# Patient Record
Sex: Female | Born: 1950 | Race: White | Hispanic: No | Smoking: Never smoker
Health system: Southern US, Community
[De-identification: ages and names within clinical notes are randomized; demographics above are authoritative.]

## PROBLEM LIST (undated history)

## (undated) ENCOUNTER — Emergency Department (HOSPITAL_BASED_OUTPATIENT_CLINIC_OR_DEPARTMENT_OTHER): Admission: EM | Payer: Medicare Other | Source: Home / Self Care

## (undated) DIAGNOSIS — I10 Essential (primary) hypertension: Secondary | ICD-10-CM

## (undated) DIAGNOSIS — D510 Vitamin B12 deficiency anemia due to intrinsic factor deficiency: Secondary | ICD-10-CM

## (undated) DIAGNOSIS — G4733 Obstructive sleep apnea (adult) (pediatric): Secondary | ICD-10-CM

## (undated) DIAGNOSIS — F0390 Unspecified dementia without behavioral disturbance: Secondary | ICD-10-CM

## (undated) DIAGNOSIS — C801 Malignant (primary) neoplasm, unspecified: Secondary | ICD-10-CM

## (undated) DIAGNOSIS — E039 Hypothyroidism, unspecified: Secondary | ICD-10-CM

## (undated) DIAGNOSIS — E785 Hyperlipidemia, unspecified: Secondary | ICD-10-CM

## (undated) DIAGNOSIS — E119 Type 2 diabetes mellitus without complications: Secondary | ICD-10-CM

## (undated) DIAGNOSIS — F329 Major depressive disorder, single episode, unspecified: Secondary | ICD-10-CM

## (undated) DIAGNOSIS — G3184 Mild cognitive impairment, so stated: Secondary | ICD-10-CM

## (undated) DIAGNOSIS — J069 Acute upper respiratory infection, unspecified: Secondary | ICD-10-CM

## (undated) DIAGNOSIS — R112 Nausea with vomiting, unspecified: Secondary | ICD-10-CM

## (undated) DIAGNOSIS — Z8669 Personal history of other diseases of the nervous system and sense organs: Secondary | ICD-10-CM

## (undated) DIAGNOSIS — F419 Anxiety disorder, unspecified: Secondary | ICD-10-CM

## (undated) DIAGNOSIS — R51 Headache: Secondary | ICD-10-CM

## (undated) DIAGNOSIS — K219 Gastro-esophageal reflux disease without esophagitis: Secondary | ICD-10-CM

## (undated) DIAGNOSIS — S0990XA Unspecified injury of head, initial encounter: Secondary | ICD-10-CM

## (undated) DIAGNOSIS — N189 Chronic kidney disease, unspecified: Secondary | ICD-10-CM

## (undated) DIAGNOSIS — M797 Fibromyalgia: Secondary | ICD-10-CM

## (undated) DIAGNOSIS — I609 Nontraumatic subarachnoid hemorrhage, unspecified: Secondary | ICD-10-CM

## (undated) DIAGNOSIS — I499 Cardiac arrhythmia, unspecified: Secondary | ICD-10-CM

## (undated) DIAGNOSIS — F411 Generalized anxiety disorder: Secondary | ICD-10-CM

## (undated) DIAGNOSIS — H269 Unspecified cataract: Secondary | ICD-10-CM

## (undated) DIAGNOSIS — K589 Irritable bowel syndrome without diarrhea: Secondary | ICD-10-CM

## (undated) DIAGNOSIS — J189 Pneumonia, unspecified organism: Secondary | ICD-10-CM

## (undated) DIAGNOSIS — Z9889 Other specified postprocedural states: Secondary | ICD-10-CM

## (undated) DIAGNOSIS — R0602 Shortness of breath: Secondary | ICD-10-CM

## (undated) DIAGNOSIS — N842 Polyp of vagina: Secondary | ICD-10-CM

## (undated) DIAGNOSIS — G4730 Sleep apnea, unspecified: Secondary | ICD-10-CM

## (undated) DIAGNOSIS — T7840XA Allergy, unspecified, initial encounter: Secondary | ICD-10-CM

## (undated) DIAGNOSIS — K648 Other hemorrhoids: Secondary | ICD-10-CM

## (undated) DIAGNOSIS — R011 Cardiac murmur, unspecified: Secondary | ICD-10-CM

## (undated) DIAGNOSIS — K5790 Diverticulosis of intestine, part unspecified, without perforation or abscess without bleeding: Secondary | ICD-10-CM

## (undated) DIAGNOSIS — C229 Malignant neoplasm of liver, not specified as primary or secondary: Secondary | ICD-10-CM

## (undated) DIAGNOSIS — R0600 Dyspnea, unspecified: Secondary | ICD-10-CM

## (undated) DIAGNOSIS — Z8489 Family history of other specified conditions: Secondary | ICD-10-CM

## (undated) DIAGNOSIS — D649 Anemia, unspecified: Secondary | ICD-10-CM

## (undated) DIAGNOSIS — M1990 Unspecified osteoarthritis, unspecified site: Secondary | ICD-10-CM

## (undated) HISTORY — DX: Major depressive disorder, single episode, unspecified: F32.9

## (undated) HISTORY — DX: Gastro-esophageal reflux disease without esophagitis: K21.9

## (undated) HISTORY — DX: Anemia, unspecified: D64.9

## (undated) HISTORY — DX: Polyp of vagina: N84.2

## (undated) HISTORY — DX: Diverticulosis of intestine, part unspecified, without perforation or abscess without bleeding: K57.90

## (undated) HISTORY — PX: CATARACT EXTRACTION: SUR2

## (undated) HISTORY — DX: Unspecified cataract: H26.9

## (undated) HISTORY — DX: Sleep apnea, unspecified: G47.30

## (undated) HISTORY — DX: Generalized anxiety disorder: F41.1

## (undated) HISTORY — DX: Hyperlipidemia, unspecified: E78.5

## (undated) HISTORY — DX: Nausea with vomiting, unspecified: R11.2

## (undated) HISTORY — DX: Allergy, unspecified, initial encounter: T78.40XA

## (undated) HISTORY — DX: Malignant neoplasm of liver, not specified as primary or secondary: C22.9

## (undated) HISTORY — DX: Personal history of other diseases of the nervous system and sense organs: Z86.69

## (undated) HISTORY — DX: Other specified postprocedural states: Z98.890

## (undated) HISTORY — DX: Other hemorrhoids: K64.8

---
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
## 1970-12-17 HISTORY — PX: DILATION AND CURETTAGE OF UTERUS: SHX78

## 1979-08-18 DIAGNOSIS — M797 Fibromyalgia: Secondary | ICD-10-CM

## 1979-08-18 DIAGNOSIS — K589 Irritable bowel syndrome without diarrhea: Secondary | ICD-10-CM

## 1979-08-18 HISTORY — DX: Fibromyalgia: M79.7

## 1979-08-18 HISTORY — DX: Irritable bowel syndrome, unspecified: K58.9

## 1981-12-17 HISTORY — PX: CHOLECYSTECTOMY: SHX55

## 1995-12-18 DIAGNOSIS — N189 Chronic kidney disease, unspecified: Secondary | ICD-10-CM

## 1995-12-18 HISTORY — DX: Chronic kidney disease, unspecified: N18.9

## 2001-01-25 ENCOUNTER — Encounter: Payer: Self-pay | Admitting: Neurology

## 2001-01-25 ENCOUNTER — Ambulatory Visit (HOSPITAL_COMMUNITY): Admission: RE | Admit: 2001-01-25 | Discharge: 2001-01-25 | Payer: Self-pay | Admitting: Neurology

## 2002-10-08 ENCOUNTER — Encounter: Admission: RE | Admit: 2002-10-08 | Discharge: 2002-10-08 | Payer: Self-pay | Admitting: Family Medicine

## 2002-10-08 ENCOUNTER — Encounter: Payer: Self-pay | Admitting: Family Medicine

## 2006-12-17 HISTORY — PX: COLONOSCOPY: SHX174

## 2007-03-24 ENCOUNTER — Ambulatory Visit: Payer: Self-pay | Admitting: Internal Medicine

## 2007-03-24 LAB — CONVERTED CEMR LAB
ALT: 29 units/L (ref 0–40)
AST: 36 units/L (ref 0–37)
Albumin: 3.9 g/dL (ref 3.5–5.2)
Alkaline Phosphatase: 47 units/L (ref 39–117)
Bilirubin, Direct: 0.2 mg/dL (ref 0.0–0.3)
Total Bilirubin: 0.8 mg/dL (ref 0.3–1.2)
Total Protein: 7.8 g/dL (ref 6.0–8.3)

## 2007-05-23 ENCOUNTER — Ambulatory Visit: Payer: Self-pay | Admitting: Internal Medicine

## 2007-05-27 ENCOUNTER — Encounter: Payer: Self-pay | Admitting: Internal Medicine

## 2007-06-26 ENCOUNTER — Ambulatory Visit: Payer: Self-pay | Admitting: Internal Medicine

## 2007-07-03 ENCOUNTER — Encounter: Payer: Self-pay | Admitting: Internal Medicine

## 2007-07-03 ENCOUNTER — Ambulatory Visit: Payer: Self-pay | Admitting: Internal Medicine

## 2007-12-18 DIAGNOSIS — J189 Pneumonia, unspecified organism: Secondary | ICD-10-CM

## 2007-12-18 HISTORY — DX: Pneumonia, unspecified organism: J18.9

## 2008-05-04 ENCOUNTER — Telehealth: Payer: Self-pay | Admitting: Internal Medicine

## 2008-12-17 DIAGNOSIS — I609 Nontraumatic subarachnoid hemorrhage, unspecified: Secondary | ICD-10-CM

## 2008-12-17 HISTORY — DX: Nontraumatic subarachnoid hemorrhage, unspecified: I60.9

## 2009-05-21 ENCOUNTER — Inpatient Hospital Stay (HOSPITAL_COMMUNITY): Admission: EM | Admit: 2009-05-21 | Discharge: 2009-05-27 | Payer: Self-pay | Admitting: Emergency Medicine

## 2009-05-24 ENCOUNTER — Ambulatory Visit: Payer: Self-pay | Admitting: Physical Medicine & Rehabilitation

## 2009-06-27 ENCOUNTER — Ambulatory Visit (HOSPITAL_COMMUNITY): Admission: RE | Admit: 2009-06-27 | Discharge: 2009-06-27 | Payer: Self-pay | Admitting: General Surgery

## 2011-01-16 NOTE — Progress Notes (Signed)
Summary: out of meds, needs refill  Medications Added BELLADONNA ALK-PHENOBARBITAL 16.2 MG TABS (BELLADONNA ALK-PHENOBARBITAL) Take 1 tablet by mouth twice a day       Phone Note Call from Patient Call back at Home Phone [phone]   Call For: [name] Summary of Call: Pt is out of her medication for IBS. [facility]. Pt really needs for pain.  Patient's chart has been requested.  Initial call taken by: Verdell Face,  May 04, 2008 3:56 PM  Follow-up for Phone Call        Patient was advised the prescription was faxed to pharmacy at her request.  I asked patient to give 2 business days notice that she needs a medication refill in the future.  Patient verbalizes understanding. Follow-up by: Hortense Ramal CMA,  May 04, 2008 4:16 PM    New/Updated Medications: BELLADONNA ALK-PHENOBARBITAL 16.2 MG TABS (BELLADONNA ALK-PHENOBARBITAL) Take 1 tablet by mouth twice a day   Prescriptions: BELLADONNA ALK-PHENOBARBITAL 16.2 MG TABS (BELLADONNA ALK-PHENOBARBITAL) Take 1 tablet by mouth twice a day  #60 x 2   Entered by:   Hortense Ramal CMA   Authorized by:   Hart Carwin MD   Signed by:   Hortense Ramal CMA on 05/04/2008   Method used:   Printed then faxed to ...       [facility]  [id number]*       [address] [place], [place]  [address]       Ph: [phone]       Fax: [phone]   RxID:   [id number]

## 2011-02-12 ENCOUNTER — Other Ambulatory Visit: Payer: Self-pay | Admitting: Family Medicine

## 2011-02-12 ENCOUNTER — Other Ambulatory Visit: Payer: Self-pay

## 2011-02-12 DIAGNOSIS — R140 Abdominal distension (gaseous): Secondary | ICD-10-CM

## 2011-02-12 DIAGNOSIS — R110 Nausea: Secondary | ICD-10-CM

## 2011-02-16 ENCOUNTER — Other Ambulatory Visit: Payer: Self-pay

## 2011-03-26 LAB — CBC
HCT: 30 % — ABNORMAL LOW (ref 36.0–46.0)
HCT: 32.8 % — ABNORMAL LOW (ref 36.0–46.0)
HCT: 33.6 % — ABNORMAL LOW (ref 36.0–46.0)
Hemoglobin: 10.3 g/dL — ABNORMAL LOW (ref 12.0–15.0)
Hemoglobin: 11.4 g/dL — ABNORMAL LOW (ref 12.0–15.0)
Hemoglobin: 11.5 g/dL — ABNORMAL LOW (ref 12.0–15.0)
MCHC: 33.8 g/dL (ref 30.0–36.0)
MCHC: 34.3 g/dL (ref 30.0–36.0)
MCHC: 35.2 g/dL (ref 30.0–36.0)
MCV: 88.4 fL (ref 78.0–100.0)
MCV: 88.8 fL (ref 78.0–100.0)
MCV: 90.3 fL (ref 78.0–100.0)
Platelets: 200 10*3/uL (ref 150–400)
Platelets: 238 10*3/uL (ref 150–400)
Platelets: 251 10*3/uL (ref 150–400)
RBC: 3.32 MIL/uL — ABNORMAL LOW (ref 3.87–5.11)
RBC: 3.69 MIL/uL — ABNORMAL LOW (ref 3.87–5.11)
RBC: 3.8 MIL/uL — ABNORMAL LOW (ref 3.87–5.11)
RDW: 14.1 % (ref 11.5–15.5)
RDW: 14.5 % (ref 11.5–15.5)
RDW: 14.6 % (ref 11.5–15.5)
WBC: 6.6 10*3/uL (ref 4.0–10.5)
WBC: 7.2 10*3/uL (ref 4.0–10.5)
WBC: 9.3 10*3/uL (ref 4.0–10.5)

## 2011-03-26 LAB — PROTIME-INR
INR: 1 (ref 0.00–1.49)
Prothrombin Time: 13.3 seconds (ref 11.6–15.2)

## 2011-03-26 LAB — GLUCOSE, CAPILLARY
Glucose-Capillary: 107 mg/dL — ABNORMAL HIGH (ref 70–99)
Glucose-Capillary: 108 mg/dL — ABNORMAL HIGH (ref 70–99)
Glucose-Capillary: 116 mg/dL — ABNORMAL HIGH (ref 70–99)
Glucose-Capillary: 120 mg/dL — ABNORMAL HIGH (ref 70–99)
Glucose-Capillary: 123 mg/dL — ABNORMAL HIGH (ref 70–99)
Glucose-Capillary: 127 mg/dL — ABNORMAL HIGH (ref 70–99)
Glucose-Capillary: 133 mg/dL — ABNORMAL HIGH (ref 70–99)
Glucose-Capillary: 134 mg/dL — ABNORMAL HIGH (ref 70–99)
Glucose-Capillary: 135 mg/dL — ABNORMAL HIGH (ref 70–99)
Glucose-Capillary: 138 mg/dL — ABNORMAL HIGH (ref 70–99)
Glucose-Capillary: 139 mg/dL — ABNORMAL HIGH (ref 70–99)
Glucose-Capillary: 140 mg/dL — ABNORMAL HIGH (ref 70–99)
Glucose-Capillary: 143 mg/dL — ABNORMAL HIGH (ref 70–99)
Glucose-Capillary: 144 mg/dL — ABNORMAL HIGH (ref 70–99)
Glucose-Capillary: 150 mg/dL — ABNORMAL HIGH (ref 70–99)
Glucose-Capillary: 152 mg/dL — ABNORMAL HIGH (ref 70–99)
Glucose-Capillary: 156 mg/dL — ABNORMAL HIGH (ref 70–99)
Glucose-Capillary: 157 mg/dL — ABNORMAL HIGH (ref 70–99)
Glucose-Capillary: 158 mg/dL — ABNORMAL HIGH (ref 70–99)
Glucose-Capillary: 158 mg/dL — ABNORMAL HIGH (ref 70–99)
Glucose-Capillary: 159 mg/dL — ABNORMAL HIGH (ref 70–99)
Glucose-Capillary: 160 mg/dL — ABNORMAL HIGH (ref 70–99)
Glucose-Capillary: 162 mg/dL — ABNORMAL HIGH (ref 70–99)
Glucose-Capillary: 172 mg/dL — ABNORMAL HIGH (ref 70–99)
Glucose-Capillary: 173 mg/dL — ABNORMAL HIGH (ref 70–99)
Glucose-Capillary: 175 mg/dL — ABNORMAL HIGH (ref 70–99)
Glucose-Capillary: 175 mg/dL — ABNORMAL HIGH (ref 70–99)
Glucose-Capillary: 184 mg/dL — ABNORMAL HIGH (ref 70–99)
Glucose-Capillary: 206 mg/dL — ABNORMAL HIGH (ref 70–99)
Glucose-Capillary: 285 mg/dL — ABNORMAL HIGH (ref 70–99)

## 2011-03-26 LAB — BASIC METABOLIC PANEL
BUN: 5 mg/dL — ABNORMAL LOW (ref 6–23)
BUN: 6 mg/dL (ref 6–23)
CO2: 29 mEq/L (ref 19–32)
CO2: 29 mEq/L (ref 19–32)
Calcium: 8.3 mg/dL — ABNORMAL LOW (ref 8.4–10.5)
Calcium: 9.6 mg/dL (ref 8.4–10.5)
Chloride: 101 mEq/L (ref 96–112)
Chloride: 104 mEq/L (ref 96–112)
Creatinine, Ser: 0.54 mg/dL (ref 0.4–1.2)
Creatinine, Ser: 0.7 mg/dL (ref 0.4–1.2)
GFR calc Af Amer: >60 mL/min (ref >60)
GFR calc Af Amer: >60 mL/min (ref >60)
GFR calc non Af Amer: >60 mL/min (ref >60)
GFR calc non Af Amer: >60 mL/min (ref >60)
Glucose, Bld: 134 mg/dL — ABNORMAL HIGH (ref 70–99)
Glucose, Bld: 184 mg/dL — ABNORMAL HIGH (ref 70–99)
Potassium: 3.5 mEq/L (ref 3.5–5.1)
Potassium: 4.1 mEq/L (ref 3.5–5.1)
Sodium: 138 mEq/L (ref 135–145)
Sodium: 140 mEq/L (ref 135–145)

## 2011-03-26 LAB — URINALYSIS, ROUTINE W REFLEX MICROSCOPIC
Bilirubin Urine: NEGATIVE
Glucose, UA: 100 mg/dL — AB
Hgb urine dipstick: NEGATIVE
Ketones, ur: 15 mg/dL — AB
Nitrite: NEGATIVE
Protein, ur: NEGATIVE mg/dL
Specific Gravity, Urine: 1.045 — ABNORMAL HIGH (ref 1.005–1.030)
Urobilinogen, UA: 0.2 mg/dL (ref 0.0–1.0)
pH: 5.5 (ref 5.0–8.0)

## 2011-03-26 LAB — POCT I-STAT, CHEM 8
BUN: 7 mg/dL (ref 6–23)
Calcium, Ion: 1.16 mmol/L (ref 1.12–1.32)
Chloride: 103 mEq/L (ref 96–112)
Creatinine, Ser: 0.7 mg/dL (ref 0.4–1.2)
Glucose, Bld: 212 mg/dL — ABNORMAL HIGH (ref 70–99)
HCT: 38 % (ref 36.0–46.0)
Hemoglobin: 12.9 g/dL (ref 12.0–15.0)
Potassium: 3.5 mEq/L (ref 3.5–5.1)
Sodium: 139 mEq/L (ref 135–145)
TCO2: 24 mmol/L (ref 0–100)

## 2011-03-26 LAB — POCT CARDIAC MARKERS
CKMB, poc: <1 ng/mL — ABNORMAL LOW (ref 1.0–8.0)
CKMB, poc: <1 ng/mL — ABNORMAL LOW (ref 1.0–8.0)
Myoglobin, poc: 152 ng/mL (ref 12–200)
Myoglobin, poc: 74.6 ng/mL (ref 12–200)
Troponin i, poc: <0.05 ng/mL (ref 0.00–0.09)
Troponin i, poc: <0.05 ng/mL (ref 0.00–0.09)

## 2011-03-26 LAB — HEPATIC FUNCTION PANEL
ALT: 58 U/L — ABNORMAL HIGH (ref 0–35)
AST: 126 U/L — ABNORMAL HIGH (ref 0–37)
Albumin: 3.2 g/dL — ABNORMAL LOW (ref 3.5–5.2)
Alkaline Phosphatase: 44 U/L (ref 39–117)
Bilirubin, Direct: 0.2 mg/dL (ref 0.0–0.3)
Indirect Bilirubin: 0.6 mg/dL (ref 0.3–0.9)
Total Bilirubin: 0.8 mg/dL (ref 0.3–1.2)
Total Protein: 6.6 g/dL (ref 6.0–8.3)

## 2011-03-26 LAB — LACTIC ACID, PLASMA: Lactic Acid, Venous: 3.6 mmol/L — ABNORMAL HIGH (ref 0.5–2.2)

## 2011-04-25 ENCOUNTER — Other Ambulatory Visit: Payer: Self-pay | Admitting: Family Medicine

## 2011-04-25 ENCOUNTER — Encounter: Payer: Self-pay | Admitting: Obstetrics & Gynecology

## 2011-04-25 ENCOUNTER — Other Ambulatory Visit (HOSPITAL_COMMUNITY)
Admission: RE | Admit: 2011-04-25 | Discharge: 2011-04-25 | Disposition: A | Discharge status: Home / Self Care | Payer: Medicaid Other | Source: Ambulatory Visit | Attending: Family Medicine | Admitting: Family Medicine

## 2011-04-25 ENCOUNTER — Other Ambulatory Visit: Payer: Self-pay | Admitting: Obstetrics and Gynecology

## 2011-04-25 ENCOUNTER — Encounter (INDEPENDENT_AMBULATORY_CARE_PROVIDER_SITE_OTHER): Payer: Medicaid Other | Admitting: Family Medicine

## 2011-04-25 DIAGNOSIS — N950 Postmenopausal bleeding: Secondary | ICD-10-CM

## 2011-04-25 DIAGNOSIS — N840 Polyp of corpus uteri: Secondary | ICD-10-CM | POA: Insufficient documentation

## 2011-04-25 LAB — POCT PREGNANCY, URINE: Preg Test, Ur: NEGATIVE

## 2011-04-26 NOTE — Group Therapy Note (Signed)
NAME:  [name], [name] NO.:  [id number]  MEDICAL RECORD NO.:  [id number]           PATIENT TYPE:  A  LOCATION:  [facility]                   FACILITY:  [facility]  PHYSICIAN:  Lucina Mellow, DO   DATE OF BIRTH:  November 25, 1951  DATE OF SERVICE:  04/25/2011                                 CLINIC NOTE  The patient presents today for some vaginal bleeding and some pink watery discharge.  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  The patient starts that since that time, she did have like 1 hour of bleeding approximately each year that would resolve, but this year she had 3 days of bleeding and now 10 month of this kind of pink watery discharge from her vagina.  She does have a history of chronic diarrhea for which she wears pad for. She notes that this pink watery discharged has been coming from her vagina and not from her rectum.  She also has a history of colon polyps which were found 4 years ago on a routine colonoscopy which were removed and found to be benign.  She is scheduled for repeat colonoscopy in 2013.  She states that she has also been having some abdominal pain with some indigestion and bloating and noted that she is having a difficult time with losing weight recently she has no history of hormone replacement therapy.  PAST MEDICAL HISTORY:  Significant for hypothyroidism.  There is minimal change of renal disease, chronic diarrhea, colon polyps, irritable bowel syndrome, asthma, high cholesterol, high blood pressure, and diabetes. The patient also has a history of TBI 2 years ago where she fell out of the __________ and experienced chronic dizziness from that.  MENSTRUAL HISTORY:  She is currently in menopause.  OBSTETRICAL HISTORY:  She has been pregnant 5 times, has a history of 3 living children 2  miscarriages last pregnancy was 1.  Babies were all born vaginally.  SURGICAL HISTORY:  She had a gallbladder removed.  SOCIAL HISTORY:  She denies use of alcohol, drugs, or tobacco. Currently is not employed and lives with her granddaughter and daughter.  MEDICATIONS:  Medications that she takes: 1. Amitriptyline 50 mg daily. 2. Atenolol 50 mg 1-1/2 tablet every morning. 3. Spironolactone with hydrochlorothiazide 25/25 daily. 4. Levothyroxine 50 mcg daily. 5. Metformin 500 mg 2 tablets daily. 6. Glimepiride 4 mg twice a day.  PHYSICAL EXAMINATION:  VITAL SIGNS:  On examination the patient has a temperature of 98.4, blood pressure 134/84, weight of 228.2 pounds, height is 63 inches. GENERAL:  The patient is a pleasant Caucasian female. HEART:  Regular rate and rhythm. LUNGS:  Clear to auscultation. PELVIC:  Externally her vagina has a small amount of vaginal atrophy with minimal hair noted internally, she has pale skin with almost no rugae but an appropriate amount of moisture.  Cervix is easily visualized and noted to be parous.  There is no active cervical discharge or bleeding noted.  An endometrial biopsy Pipelle is easily inserted and passed to 9 cm  and 2 passes were made to obtain a sample for endometrial biopsy.  Bimanual exam is difficult due to body habitus.  ASSESSMENT:  Postmenopausal bleeding.  We will obtain labs for FSH, LH, CBC, TSH today.  She had a negative urine pregnancy test prior to the endometrial biopsy.  Endometrial biopsy also was sent.  We will also get an ultrasound to evaluate her uterus and ovaries and her endometrial stripe.  The patient is to follow up after her ultrasound so that we can discuss the results of the treatment and plan necessary.  This was shared with the patient.  She voices her understanding and she is discharged from the clinic.          ______________________________ Lucina Mellow, DO    SH/MEDQ  D:  04/25/2011  T:   04/26/2011  Job:  [id number]

## 2011-05-01 ENCOUNTER — Other Ambulatory Visit (HOSPITAL_COMMUNITY): Payer: Medicaid Other

## 2011-05-01 ENCOUNTER — Ambulatory Visit (HOSPITAL_COMMUNITY)
Admission: RE | Admit: 2011-05-01 | Discharge: 2011-05-01 | Disposition: A | Discharge status: Home / Self Care | Payer: Medicaid Other | Source: Ambulatory Visit | Attending: Obstetrics and Gynecology | Admitting: Obstetrics and Gynecology

## 2011-05-01 DIAGNOSIS — M549 Dorsalgia, unspecified: Secondary | ICD-10-CM | POA: Insufficient documentation

## 2011-05-01 DIAGNOSIS — R109 Unspecified abdominal pain: Secondary | ICD-10-CM | POA: Insufficient documentation

## 2011-05-01 DIAGNOSIS — N950 Postmenopausal bleeding: Secondary | ICD-10-CM | POA: Insufficient documentation

## 2011-05-01 NOTE — Discharge Summary (Signed)
[name], [name] NO.:  [id number]   MEDICAL RECORD NO.:  [id number]          PATIENT TYPE:  INP   LOCATION:  5152                         FACILITY:  [facility]   PHYSICIAN:  Gabrielle Dare. [name], M.D.DATE OF BIRTH:  August 20, 1951   DATE OF ADMISSION:  05/21/2009  DATE OF DISCHARGE:  05/27/2009                               DISCHARGE SUMMARY   DISCHARGE DIAGNOSES:  1. Fall.  2. Traumatic brain injury, subarachnoid hemorrhage.  3. Multiple bilateral rib fractures with left pulmonary contusion.  4. Diabetes.  5. Hypertension.  6. Nephrotic syndrome.  7. Asthma.  8. Anxiety disorder.  9. Irritable bowel syndrome.  10.Hypothyroidism.  11.Obesity.   CONSULTANTS:  Hilda Lias, MD from Neurosurgery.   PROCEDURES:  None.   HISTORY OF PRESENT ILLNESS:  This is a 60 year old white female who was  at the top step of her attic and fell.  There was loss of consciousness  and she is amnestic to the event.  She came in as a level II trauma.  Workup demonstrated the multiple bilateral rib fractures and  subarachnoid hemorrhage and she was admitted to the intensive care unit  for pain control, pulmonary toilet, and observation of her brain injury.   HOSPITAL COURSE:  Initially, the patient's respiratory status was very  poor.  She could barely activate the incentive spirometer, although her  chest x-ray remained remarkably clear throughout her hospital stay.  Her  followup CT scan showed improvement in her brain injury.  Because of her  pain, she was very slow to mobilize.  Rehabilitation was consulted, but  towards the end of her hospital stay, the patient began to improve much  faster than she had early on her stay.  She had some mild acute blood  loss anemia, which did not require any blood products.  Eventually, she  was able to do well enough with physical therapy.  They felt comfortable  discharging home in the care of her daughter.   DISCHARGE MEDICATIONS:  Norco  5/325, take one to two p.o. q.4 h. p.r.n.  pain, #60, with no refill.  In addition, she is to resume her home  medications which include Elavil 50 mg nightly, Tenormin, it is unclear  what home dose of Tenormin the patient is on., spironolactone and  hydrochlorothiazide 25/25 daily, metformin 1000 mg twice daily,  glimepiride 4 mg daily, and Synthroid 50 mcg daily.   FOLLOWUP:  The patient should follow up with Dr. Lazarus Salines in the next 2-3  weeks as she was seeing him for some premorbid dizziness that may have  contributed to this fall.  She will call the Trauma Service with any  questions or concerns but followup with [name] and with Dr. Jeral Fruit will be  on an as-needed basis.  She should stop taking aspirin for approximately  1 month.  She may restart after that.      Earney Hamburg, P.A.      Gabrielle Dare [name], M.D.  Electronically Signed    MJ/MEDQ  D:  05/27/2009  T:  05/28/2009  Job:  [id number]   cc:   Hilda Lias,  M.D.  Gloris Manchester. Lazarus Salines, M.D.

## 2011-05-01 NOTE — Consult Note (Signed)
NAMEKAMAYAH, PILLAY NO.:  [id number]   MEDICAL RECORD NO.:  [id number]          PATIENT TYPE:  INP   LOCATION:  3106                         FACILITY:  [facility]   PHYSICIAN:  Hilda Lias, M.D.   DATE OF BIRTH:  [birth year]   DATE OF CONSULTATION:  05/21/2009  DATE OF DISCHARGE:                                 CONSULTATION   Ms. Scruton is a 60 year old who this afternoon was in the attic and  daughter saw when she fell.  She was not witnessed so we do not know if  she tripped and fell or she has had spontaneous fall.  Nevertheless, she  was brought to the emergency room.  She was seen immediately.  Trauma  Service supposed to be seeing her.  X-ray showed that she has broken  ribs and she has atelectasis.  The CT scan of the brain showed that she  had multiple levels of bleeding most likely subarachnoid with blood in  the ventricles as well as blood at the level of the tentorium of the  cerebellum.  Clinically, I talked to her, although she is awake,  oriented x2.  She does not remember detail of the accident.  She does  not remember if she had a headache before she fell or she fell and then  she developed a headache.  She has a history of high blood pressure as  well as diabetes.  She is oriented to person and place.  She had minimal  stiffness of the neck.  There is no evidence of any blood or CSF coming  from the ear.  Strength is normal in the upper extremities.  Sensation  normal.  The cranial nerves, the pupils are 3-mm.  She has a full ocular  movement and said that she complains of some pain when she move her  eyes.  She also complains of nausea.  The CT scan showed that she has  several level of hemorrhage in the subarachnoid space and some in the  intraventricular area.   CLINICAL IMPRESSION:  Multiple trauma, subarachnoid bleeding either  spontaneous versus traumatic, fracture of the ribs.   RECOMMENDATIONS:  I talked personally with Dr. Corliss Skains  from the x-ray  department.  Once she is cleared by Trauma, this lady is going to  require a cerebral angiogram to rule out the possibility of aneurysm as  the source of the trauma.  I was able to talk to the son as well as the  daughter, and fully explained the procedure to both of them and what we  are looking for.           ______________________________  Hilda Lias, M.D.     EB/MEDQ  D:  05/21/2009  T:  05/22/2009  Job:  [id number]

## 2011-05-01 NOTE — H&P (Signed)
NAMELAKEIDRA, [name] NO.:  [id number]   MEDICAL RECORD NO.:  [id number]          PATIENT TYPE:  INP   LOCATION:  3106                         FACILITY:  [facility]   PHYSICIAN:  Adolph Pollack, M.D.DATE OF BIRTH:  1951-07-17   DATE OF ADMISSION:  05/21/2009  DATE OF DISCHARGE:                              HISTORY & PHYSICAL   HISTORY:  This 60 year old female fell 10 feet from the top step of the  attic.  It is unknown exactly why she fell.  There was a loss of  consciousness.  She is amnestic to the event.  She is brought to [facility] for evaluation complaining of headache and  right upper quadrant pain.   PAST MEDICAL HISTORY:  1. Nephrotic syndrome.  2. Type 2 diabetes mellitus.  3. Hypertension.  4. Thyroid disease.  5. Anxiety.  6. Irritable bowel.  7. Asthma.  8. Pneumonia.   PREVIOUS OPERATIONS:  Cholecystectomy.   ALLERGIES:  SULFA drugs.   MEDICATIONS:  Metformin, glimepiride, hydrochlorothiazide, Elavil,  aspirin, Tenormin, and Synthroid.   SOCIAL HISTORY:  She is divorced.  No tobacco or alcohol use.   PRIMARY CARE PHYSICIAN:  Caryn Bee L. Little, MD   REVIEW OF SYSTEMS:  CARDIAC:  Denies heart disease.  GI:  Denies peptic  ulcer disease or hepatitis.  GU:  No kidney stones.  NEUROLOGIC:  Denies  strokes or seizures, but has been little bit dizzy for the past month as  is her granddaughter.  HEMATOLOGIC:  No bleeding disorder or blood  clots.   PHYSICAL EXAMINATION:  GENERAL:  Obese female, in no acute distress.  VITAL SIGNS:  Temperature is 98.4, blood pressure is 138/64, pulse 99,  respiratory rate 20, and O2 sats 98%.  HEENT:  Normocephalic and atraumatic.  Eyes:  PERRL.  EOMI.  Ear:  Tympanic membranes are clear.  NECK:  No C-spine tenderness.  Trachea is midline.  PULMONARY:  She has right lateral chest wall tenderness to palpation.  Breath sounds are equal and clear.  CARDIOVASCULAR:  Regular rate and regular  rhythm.  ABDOMEN:  Soft and obese with right upper quadrant scar and there is no  tenderness.  PELVIS:  No pelvic tenderness.  MUSCULOSKELETAL:  No palpable bony deformities or tenderness.  BACK:  No spinal tenderness.  NEUROLOGIC:  She is alert and oriented x3, but does answer slowly.  Glasgow coma scale is 15.  She has good motor strength.   LABORATORY DATA:  Notable for glucose of 212.  Hemoglobin 11.4.  INR  1.0.  Platelet count 251,000.   X-RAYS:  Chest x-ray demonstrates bilateral rib fractures.  No  pneumothorax.  A CT of the head demonstrates acute subarachnoid  hemorrhage as well as intraventricular hemorrhage.  CT of the neck, no C-  spine fracture.  CT of the face, no maxillofacial fractures.  CT of the  chest demonstrates right third to nine rib fractures as well as a left  2nd rib fracture.  No vascular injury.  No pneumothorax.  CT of the  abdomen and pelvis, no solid organ injury or no free  fluid.   IMPRESSION:  1. Status post fall with traumatic brain injury - there is some      question as to whether she had a neurologic event prior to the fall      and was causing the fall.  Dr. Jeral Fruit has seen her for this.  2. Multiple rib fractures.  No pneumothorax.  3. Diabetes mellitus.  4. Obesity.   PLAN:  We will admit to the ICU.  She is going to have an angiogram of  the carotid arteries and cerebral arteries per Dr. Jeral Fruit to look to see  if there was some event and some neurologic bleed that led to the fall.  Incentive spirometer sliding scale insulin.  Check labs in the morning.      Adolph Pollack, M.D.  Electronically Signed     TJR/MEDQ  D:  05/21/2009  T:  05/22/2009  Job:  [id number]

## 2011-05-04 NOTE — Assessment & Plan Note (Signed)
[facility]                         GASTROENTEROLOGY OFFICE NOTE   NAME:[name]                    MRN:          [id number]  DATE:03/24/2007                            DOB:          17-Jul-1951    Ms. Revolorio is a 60 year old white female known to me from her  hospitalization of approximately 20 years ago for a nephrotic syndrome.  Unfortunately we do not have her office records which are in the  warehouse, but we have requested it.  She is here today because of  rectal itching which has been quite intense and some bloating.  For past  several months she has experienced irregular bowel habits consistent  with her prior history of irritable bowel syndrome.  She is a diabetic  on metformin and Amaryl.  She has used topical steroids for rectal  irritation, which occurs mostly in the area outside of her rectum.  She  denies any rectal bleeding.  Last colonoscopy exam was approximately 10  years ago and was normal, but again, we do not have the records for  review at this time.   MEDICATIONS:  1. Tenormin 50 mg p.o. daily.  2. Synthroid 0.05 mg p.o. daily.  3. Elavil 50 mg p.o. daily.  4. Metformin 500 mg p.o. daily.  5. Amaryl 4 mg p.o. daily.  6. Aldactazide.  7. Aspirin 325 mg p.o. daily.   PAST HISTORY:  1. Diabetes.  2. Hypertension.  3. Thyroid problems since 1997.  4. Anxiety.  5. She has allergies.  6. Chronic renal insufficiency since 1997.   PREVIOUS OPERATIONS:  Open cholecystectomy 1983 for cholelithiasis.   FAMILY HISTORY:  Negative for colon cancer or inflammatory bowel  disease.  Bleeding disorder in grandmother, heart disease in paternal  grandfather.   SOCIAL HISTORY:  The patient is separated, has 3 children.  She lives  with her younger daughter.  The patient does not smoke, does not drink  alcohol.   REVIEW OF SYSTEMS:  Positive for allergies, joint pains, back pain,  rectal itching, muscle pain.   PHYSICAL  EXAMINATION:  Blood pressure 124/82, pulse of 92, and weight  250 pounds.  She was clearly overweight, alert, oriented, in no  distress.  Sclerae was nonicteric, oral cavity no thrush.  LUNGS:  Clear to auscultation.  COR:  Normal S1, S2.  ABDOMEN: Soft but quite obese, nontender with normoactive bowel sounds,  no distention.  Post cholecystectomy scar in right upper quadrant.  RECTAL:  Anoscopic exam showed maceration around the perianal area with  weeping and pinkish-reddish exudate with some whitish exudate as well,  consistent with candida dermatitis.  Rectal tone was somewhat decreased.  Stool was soft, Hemoccult negative.  No evidence of proctitis.   IMPRESSION:  55. A 60 year old white female with what looks like a candida      dermatitis in the perianal area  2. Irritable bowel syndrome.  3. Rule out bacteria overgrowth, rule out autonomic or visceral      neuropathy causind diarrhea  4. Obesity.  5. History of chronic renal insufficiency.   PLAN:  1. Use Diflucan 100 mg  p.o. daily x5 days.  Since patient has no      insurance we will limit the Diflucan course to the minimum interval      that will control her symptoms.  2. Flagyl 250 mg p.o. t.i.d. for one week.  3. If no improvement in next week, would suggest that patient      undergoes colonoscopy to rule  inflammatory bowel disease.  4. We are going to repeat her liver function test today.     Hedwig Morton. Juanda Chance, MD  Electronically Signed    DMB/MedQ  DD: 03/24/2007  DT: 03/24/2007  Job #: [id number]   cc:   Caryn Bee L. Little, M.D.

## 2011-05-09 ENCOUNTER — Ambulatory Visit: Payer: Medicaid Other | Admitting: Family Medicine

## 2011-05-09 ENCOUNTER — Other Ambulatory Visit: Payer: Self-pay | Admitting: Family Medicine

## 2011-05-09 DIAGNOSIS — R198 Other specified symptoms and signs involving the digestive system and abdomen: Secondary | ICD-10-CM

## 2011-05-09 DIAGNOSIS — N949 Unspecified condition associated with female genital organs and menstrual cycle: Secondary | ICD-10-CM

## 2011-05-09 DIAGNOSIS — N950 Postmenopausal bleeding: Secondary | ICD-10-CM

## 2011-05-09 DIAGNOSIS — R109 Unspecified abdominal pain: Secondary | ICD-10-CM

## 2011-05-09 DIAGNOSIS — R140 Abdominal distension (gaseous): Secondary | ICD-10-CM

## 2011-05-09 DIAGNOSIS — K5732 Diverticulitis of large intestine without perforation or abscess without bleeding: Secondary | ICD-10-CM

## 2011-05-09 DIAGNOSIS — N925 Other specified irregular menstruation: Secondary | ICD-10-CM

## 2011-05-09 DIAGNOSIS — N938 Other specified abnormal uterine and vaginal bleeding: Secondary | ICD-10-CM

## 2011-05-10 NOTE — Group Therapy Note (Signed)
NAME:  [name], [name] NO.:  [id number]  MEDICAL RECORD NO.:  [id number]           PATIENT TYPE:  A  LOCATION:  [facility]                   FACILITY:  [facility]  PHYSICIAN:  Lucina Mellow, DO   DATE OF BIRTH:  [birth year]  DATE OF SERVICE:  05/09/2011                                 CLINIC NOTE  The patient presents today for the followup after having endometrial biopsy and ultrasound results.  HISTORY OF PRESENT ILLNESS:  The patient is a 60 year old Caucasian female who states that she went through menopause at age 1 and was diagnosed at that time also with minimal change renal disease.  She states that previously to that she had been having some irregular bleeding including white watery discharge from her vagina.  She was seen on Apr 25, 2011,  by myself, and I performed endometrial biopsy at that time and I ordered for an ultrasound.  She stated at that time she had a history of colon polyps that were found 4 years previously and colonoscopy they were removed and told to be benign.  Since then she has been having some abdominal pain and indigestion and bloating and noted she was having a difficult time maintaining her weight.  The patient was seen by Dr. Catha Gosselin at [facility] earlier in 2012 in February and some lab tests were ordered and she was given a prescription for Flagyl which she thought may be was diet flu can states that when she was on the medicine she felt "miserable" but then afterwards, she felt much better.  She thinks that she needs another course of these antibiotics because of the bloating and the symptoms that she is having currently; however, the patient is having some financial troubles and currently is not going back to see Dr. Clarene Duke and she is currently with a Medicaid card that will expire at the end of June this year.  PHYSICAL EXAMINATION:  GENERAL:  Today on exam she is a pleasant Caucasian female who  looks her stated age of 60.  She has trouble right collecting things specifically in and rapid recall noted on exam.  The patient states this has been going on since she had her head injury 2 years ago. HEART:  Regular rate and rhythm with no audible murmurs. LUNGS:  Clear to auscultation bilaterally. ABDOMEN:  She does have somewhat hyperactive bowel sounds bilaterally, but her abdomen is otherwise soft and benign.  LABORATORY DATA:  Her lab exams that are back showed endometrial biopsy gives [name] a benign endometrial polyp her ultrasound shows no fibroids or other uterine masses identified.  The uterus is retroverted with an AP depth and 5.1 cm in transverse width of 5.3 cm.  The thickened endometrial lining is stated to be somewhat abnormal based on the patient's age and postmenopausal status and it is recommended that further evaluation be undertaken with hysterosonography since the endometrial biopsy was inconclusive.  The patient's TSH was 1.253.  FSH was 47, LH is 33.1, hemoglobin was 13.2, hematocrit was 39.0.  The patient records were from Dr. Fredirick Maudlin office.  She had a clinic note visit on February 06, 2011,  it states that she was given Flagyl but no other medication is noted and did not know what she was given.  Her lab tests were also sent which pertinent findings include consistently elevated AST at 60 and at 43.  Hemoglobin and hematocrit are stable. She had negative C. difficile toxins A and B.  She had negative Giardia lamblia, negative salmon, negative E. coli and negative Shigella.  H. pylori was not done.  Eosinophils were not elevated on the blood work. It was noted in the note as well that Dr. Clarene Duke had discussed with her that she likely needed a CT abdomen and pelvis if the symptoms continued.  ASSESSMENT: 1. Dysfunctional uterine bleeding versus postmenopausal bleeding.  The     patient needs to be seen by one of our surgeons to discuss a     hysterosonography,  and she will make an appointment to follow up     with our surgeon for that. 2. Abdominal pain with bloating likely diverticulosis, prolonged     history taking with the patient who had difficulty with complete     recall.  It sounds like she has a history of diverticulosis and     with past prescriptions of Flagyl that is consistent with treatment     of such.  Since I do not have full access to all of her records, we     discussed possible options and the patient is eager to do another     course of Flagyl and Cipro to treat her bloating symptoms.  I will     repeat her chemistry panel for her kidney function and for her     liver function get a hepatitis panel to make sure that is not     contributing to the reason why her AST consistently remains     elevated and I will order a CT scan of her abdomen and pelvis with     and without contrast.  The chemistry panel will give [name] her renal     function prior to the contrast and I have told the patient to hold     her metformin the day before and the day of her CT scan to monitor     sugars in her diet closely and to drink plenty water.  The patient     will follow back up with me after her CT scan so we can discuss     these results.  If something comes back significantly abnormal     prior to then I will give the patient a call.  The patient is given     a prescription for Flagyl each 500 mg each twice a day for a course     of 10 days.  The patient is also instructed that she needs to give     her gastroenterologist Dr. Juanda Chance a phone call to know about her     symptoms and determine whether or not she needs to have a visit     with this gastroenterologist prior to the summer of 2013 and likely     to have what I suspect she will likely need to have her colonoscopy     repeated sooner.  The patient voices understanding of all these     instructions they are hand written for her and given to her upon     discharge from the clinic.  All of  her questions were answered, and     I will  see her back as scheduled.          ______________________________ Lucina Mellow, DO    SH/MEDQ  D:  05/09/2011  T:  05/10/2011  Job:  [id number]

## 2011-05-11 ENCOUNTER — Ambulatory Visit (HOSPITAL_COMMUNITY)
Admission: RE | Admit: 2011-05-11 | Discharge: 2011-05-11 | Disposition: A | Discharge status: Home / Self Care | Payer: Medicaid Other | Source: Ambulatory Visit | Attending: Family Medicine | Admitting: Family Medicine

## 2011-05-11 DIAGNOSIS — R7989 Other specified abnormal findings of blood chemistry: Secondary | ICD-10-CM | POA: Insufficient documentation

## 2011-05-11 DIAGNOSIS — Z9089 Acquired absence of other organs: Secondary | ICD-10-CM | POA: Insufficient documentation

## 2011-05-11 DIAGNOSIS — R198 Other specified symptoms and signs involving the digestive system and abdomen: Secondary | ICD-10-CM

## 2011-05-11 DIAGNOSIS — R109 Unspecified abdominal pain: Secondary | ICD-10-CM

## 2011-05-11 DIAGNOSIS — N898 Other specified noninflammatory disorders of vagina: Secondary | ICD-10-CM | POA: Insufficient documentation

## 2011-05-11 DIAGNOSIS — R140 Abdominal distension (gaseous): Secondary | ICD-10-CM

## 2011-05-11 DIAGNOSIS — K5730 Diverticulosis of large intestine without perforation or abscess without bleeding: Secondary | ICD-10-CM | POA: Insufficient documentation

## 2011-05-11 MED ORDER — IOHEXOL 300 MG/ML  SOLN
100.0000 mL | Freq: Once | INTRAMUSCULAR | Status: AC | PRN
  Administered 2011-05-11: 100 mL via INTRAVENOUS

## 2011-05-15 ENCOUNTER — Inpatient Hospital Stay (HOSPITAL_COMMUNITY)
Admission: AD | Admit: 2011-05-15 | Discharge: 2011-05-15 | Disposition: A | Discharge status: Home / Self Care | Payer: Medicaid Other | Source: Ambulatory Visit | Attending: Family Medicine | Admitting: Family Medicine

## 2011-05-15 DIAGNOSIS — E860 Dehydration: Secondary | ICD-10-CM | POA: Insufficient documentation

## 2011-05-15 DIAGNOSIS — R319 Hematuria, unspecified: Secondary | ICD-10-CM | POA: Insufficient documentation

## 2011-05-15 LAB — BASIC METABOLIC PANEL
BUN: 9 mg/dL (ref 6–23)
CO2: 29 mEq/L (ref 19–32)
Calcium: 10 mg/dL (ref 8.4–10.5)
Chloride: 97 mEq/L (ref 96–112)
Creatinine, Ser: 0.9 mg/dL (ref 0.4–1.2)
GFR calc Af Amer: >60 mL/min (ref >60)
GFR calc non Af Amer: >60 mL/min (ref >60)
Glucose, Bld: 182 mg/dL — ABNORMAL HIGH (ref 70–99)
Potassium: 3.1 mEq/L — ABNORMAL LOW (ref 3.5–5.1)
Sodium: 140 mEq/L (ref 135–145)

## 2011-05-15 LAB — URINALYSIS, ROUTINE W REFLEX MICROSCOPIC
Glucose, UA: NEGATIVE mg/dL
Hgb urine dipstick: NEGATIVE
Ketones, ur: NEGATIVE mg/dL
Nitrite: NEGATIVE
Protein, ur: NEGATIVE mg/dL
Specific Gravity, Urine: 1.02 (ref 1.005–1.030)
Urobilinogen, UA: 0.2 mg/dL (ref 0.0–1.0)
pH: 7 (ref 5.0–8.0)

## 2011-05-30 ENCOUNTER — Ambulatory Visit (INDEPENDENT_AMBULATORY_CARE_PROVIDER_SITE_OTHER): Payer: Medicaid Other | Admitting: Family Medicine

## 2011-05-30 DIAGNOSIS — N950 Postmenopausal bleeding: Secondary | ICD-10-CM

## 2011-05-30 DIAGNOSIS — E119 Type 2 diabetes mellitus without complications: Secondary | ICD-10-CM

## 2011-05-31 NOTE — Group Therapy Note (Unsigned)
NAME:  RENNE, [name] NO.:  [id number]  MEDICAL RECORD NO.:  [id number]           PATIENT TYPE:  A  LOCATION:  [facility]                   FACILITY:  [facility]  PHYSICIAN:  Lucina Mellow, DO   DATE OF BIRTH:  1951/04/10  DATE OF SERVICE:  05/30/2011                                 CLINIC NOTE  The patient presents for followup of her lab results and CT scan.  The patient states that she stopped taking the metformin before and after the CT scan and her abdominal bloating, abdominal cramping, diarrhea and headaches seemed to have resolved.  She had a total of 72 hours without the medication and as soon as she started the medication back, these symptoms slowly and completely reappeared.  She decided try not taking the medication again and again her symptoms resolved.  She feels that she may have an allergy to the metformin.  The patient states that previously she was on glipizide only, but her blood sugars were not well controlled which is why the endocrinologist that she was seeing at that time placed her on the metformin.  When she was placed on the metformin, it was noted that her blood pressures were well controlled.  She has been on the metformin approximately 4 years but these symptoms have only showed up in approximately 6 months.  She states that she does not check her blood sugars as well as she should at home but she certainly could. The patient also states that she has an appointment on July 5 to see the surgeon in regard to her vaginal bleeding which does continue, she continues to have a watery blood-tinged discharge.  She states that she otherwise is feeling much better especially since she quit taking the metformin.  We discussed this in at length.  PAST MEDICAL HISTORY:  Postmenopausal bleeding, minimal change kidney disease, colon polyps, vaginal polyps, chronic diarrhea, diverticulitis and diverticulosis, dysfunctional uterine bleeding, fatty liver  and type 2 diabetes.  The patient today in the clinic; blood pressure of 128/82, weight 221.4 pounds, height is 63 inches, pulse 104, temperature of 98.1.  In general, she is a very pleasant female who actually looks in much more comfort today than in the past visits with me.  Her heart is regular rate and rhythm.  No audible murmurs.  Her lungs are clear to auscultation bilaterally.  Abdomen has normal sounding bowel sounds and is soft and nontender to palpation.  ASSESSMENT: 1. Dysfunctional uterine bleeding.  As previously noted, she does have     an appointment on July 5 to discuss with one of the surgeons the     hysterosonography which was recommended at the time of her     ultrasound. 2. Diabetes.  We discussed at length her metformin and also how this     could have been the source of all her discomfort in the very     beginning.  She did complete the course of Flagyl and ciprofloxacin     which also could have helped relieve her symptoms.  However she did     notice that her symptoms returned when she started back on  the     metformin.  We discussed that I will add metformin to her list of     allergies and she will no longer take metformin at this time.  We     will try Acarbose 25 mg with meals and see how this controls her     blood sugars as well as follow back up to see if this has increased     her gastrointestinal distress again.  The patient will follow back     up with me in a month to discuss her symptoms and she will bring a     log of her blood sugars with her so we can determine how well it is     controlling her sugar.  If the Acarbose causes the gastrointestinal     distress again, the patient may need to consider going on a low     dose of long-acting insulin as all of the hyperglycemic medications     do have the GI side effect profile.  I do not think that she is a     good candidate for Avandia or Actos based upon the risk that is     increased with renal  disease and cardiomyopathies as well as     congestive heart failure.  The patient voices understanding of     this.  In addition, the patient states that she feels like her     Medicaid should be extended beyond the end of June which is when     she initially felt that it was going to be stopped.  She is going     to restart this and let us know if this is not the case because we     may need to consider what is her best option for treatment because     of the cost of the medications that she needs for her diabetes.     The patient will also call back to the clinic if she is     experiencing severe side effects and I will see her sooner as     indicated.  The patient voices understanding.  All of her questions     were answered.          ______________________________ Lucina Mellow, DO    SH/MEDQ  D:  05/30/2011  T:  05/31/2011  Job:  [id number]

## 2011-06-16 DIAGNOSIS — K760 Fatty (change of) liver, not elsewhere classified: Secondary | ICD-10-CM | POA: Insufficient documentation

## 2011-06-16 DIAGNOSIS — N289 Disorder of kidney and ureter, unspecified: Secondary | ICD-10-CM

## 2011-06-16 DIAGNOSIS — N950 Postmenopausal bleeding: Secondary | ICD-10-CM

## 2011-06-16 DIAGNOSIS — K5792 Diverticulitis of intestine, part unspecified, without perforation or abscess without bleeding: Secondary | ICD-10-CM

## 2011-06-16 DIAGNOSIS — K529 Noninfective gastroenteritis and colitis, unspecified: Secondary | ICD-10-CM

## 2011-06-16 DIAGNOSIS — N842 Polyp of vagina: Secondary | ICD-10-CM | POA: Insufficient documentation

## 2011-06-16 DIAGNOSIS — K635 Polyp of colon: Secondary | ICD-10-CM

## 2011-06-16 DIAGNOSIS — N938 Other specified abnormal uterine and vaginal bleeding: Secondary | ICD-10-CM

## 2011-06-16 DIAGNOSIS — E119 Type 2 diabetes mellitus without complications: Secondary | ICD-10-CM | POA: Insufficient documentation

## 2011-06-16 DIAGNOSIS — E1122 Type 2 diabetes mellitus with diabetic chronic kidney disease: Secondary | ICD-10-CM | POA: Insufficient documentation

## 2011-06-21 ENCOUNTER — Ambulatory Visit (INDEPENDENT_AMBULATORY_CARE_PROVIDER_SITE_OTHER): Payer: Medicaid Other | Admitting: Family Medicine

## 2011-06-21 DIAGNOSIS — N950 Postmenopausal bleeding: Secondary | ICD-10-CM

## 2011-06-22 NOTE — Group Therapy Note (Signed)
NAME:  [name], [name] NO.:  [id number]  MEDICAL RECORD NO.:  [id number]           PATIENT TYPE:  A  LOCATION:  [facility]                   FACILITY:  [facility]  PHYSICIAN:  Tinnie Gens, MD        DATE OF BIRTH:  08/28/1951  DATE OF SERVICE:  06/21/2011                                 CLINIC NOTE  CHIEF COMPLAINT:  Postmenopausal bleeding.  HISTORY OF PRESENT ILLNESS:  The patient is a 60 year old gravida 5, para 3-0-2-3 who came in with postmenopausal bleeding.  The patient has pinkish discharge.  She underwent endometrial sampling pelvic CT and ultrasonography for some abdominal discomfort bleeding, diarrhea issues. Her abdominal discomfort, bleeding, and diarrhea has resolved after discontinuing the metformin.  Her endometrial biopsy revealed benign endometrial polyp.  Pelvic sonography revealed normal-appearing right ovary, unseen left ovary.  Endometrial thickness of 8 mm which is thick and pureed.  Otherwise, the uterus and ovaries appeared normal.  The patient reports her blood sugars has not been well controlled since she stopped her Metformin. and she is unwilling to go back on this medicine as it creates a GI disturbance for her.  The patient also has a minimal change kidney disease.  She reports that someone told previously she could not go to sleep or have anesthesia with that diagnosis.  She has not seen a nephrologist for many years because she has issues with insurance.  She does have a primary care provider.  She also recently obtained Medicaid.  PHYSICAL EXAMINATION:  VITAL SIGNS:  As noted in the chart.  She is 224 pounds. ABDOMEN:  Soft, nontender, and nondistended. GU:  Normal external female genitalia.  BUS is normal.  Vagina is pink and rugated.  Cervix is parous and anterior.  Uterus is retroverted, small, no adnexal mass or tenderness appreciated.  IMPRESSION:  Postmenopausal bleeding with benign endometrial sampling. The symptoms continue.   She has a thickened endometrium by ultrasound.  PLAN:  D and C hysteroscopy for better endometrial sampling.          ______________________________ Tinnie Gens, MD    TP/MEDQ  D:  06/21/2011  T:  06/22/2011  Job:  [id number]

## 2011-06-27 ENCOUNTER — Other Ambulatory Visit: Payer: Self-pay

## 2011-06-27 ENCOUNTER — Encounter (HOSPITAL_COMMUNITY): Payer: Self-pay

## 2011-06-27 ENCOUNTER — Encounter (HOSPITAL_COMMUNITY)
Admission: RE | Admit: 2011-06-27 | Discharge: 2011-06-27 | Disposition: A | Discharge status: Home / Self Care | Payer: Medicaid Other | Source: Ambulatory Visit | Attending: Family Medicine | Admitting: Family Medicine

## 2011-06-27 HISTORY — DX: Morbid (severe) obesity due to excess calories: E66.01

## 2011-06-27 HISTORY — DX: Hypothyroidism, unspecified: E03.9

## 2011-06-27 HISTORY — DX: Essential (primary) hypertension: I10

## 2011-06-27 HISTORY — DX: Cardiac arrhythmia, unspecified: I49.9

## 2011-06-27 HISTORY — DX: Acute upper respiratory infection, unspecified: J06.9

## 2011-06-27 HISTORY — DX: Major depressive disorder, single episode, unspecified: F32.9

## 2011-06-27 HISTORY — DX: Headache: R51

## 2011-06-27 HISTORY — DX: Pneumonia, unspecified organism: J18.9

## 2011-06-27 HISTORY — DX: Anxiety disorder, unspecified: F41.9

## 2011-06-27 HISTORY — DX: Chronic kidney disease, unspecified: N18.9

## 2011-06-27 HISTORY — DX: Unspecified injury of head, initial encounter: S09.90XA

## 2011-06-27 HISTORY — DX: Fibromyalgia: M79.7

## 2011-06-27 HISTORY — DX: Irritable bowel syndrome without diarrhea: K58.9

## 2011-06-27 HISTORY — DX: Shortness of breath: R06.02

## 2011-06-27 LAB — BASIC METABOLIC PANEL
BUN: 10 mg/dL (ref 6–23)
CO2: 31 mEq/L (ref 19–32)
Calcium: 9.8 mg/dL (ref 8.4–10.5)
Chloride: 94 mEq/L — ABNORMAL LOW (ref 96–112)
Creatinine, Ser: 0.77 mg/dL (ref 0.50–1.10)
GFR calc Af Amer: >60 mL/min (ref >60)
GFR calc non Af Amer: >60 mL/min (ref >60)
Glucose, Bld: 257 mg/dL — ABNORMAL HIGH (ref 70–99)
Potassium: 3.8 mEq/L (ref 3.5–5.1)
Sodium: 133 mEq/L — ABNORMAL LOW (ref 135–145)

## 2011-06-27 LAB — CBC
HCT: 38.5 % (ref 36.0–46.0)
Hemoglobin: 13.1 g/dL (ref 12.0–15.0)
MCH: 30.3 pg (ref 26.0–34.0)
MCHC: 34 g/dL (ref 30.0–36.0)
MCV: 89.1 fL (ref 78.0–100.0)
Platelets: 236 10*3/uL (ref 150–400)
RBC: 4.32 MIL/uL (ref 3.87–5.11)
RDW: 13.9 % (ref 11.5–15.5)
WBC: 6.9 10*3/uL (ref 4.0–10.5)

## 2011-06-27 NOTE — Anesthesia Preprocedure Evaluation (Addendum)
Anesthesia Evaluation  Name, MR# and DOB Patient awake  General Assessment Comment  History of Anesthesia Complications (+) PONV  Airway Mallampati: III TM Distance: >3 FB     Dental No notable dental hx (+) Caps   Pulmonary  Asthma: "slight"    pulmonary exam normal   Cardiovascular + DOE Regular Normal   Neuro/Psych  GI/Hepatic/Renal   Endo/Other   (+)  Morbid obesity Abdominal Normal abdominal exam  (+) obese,   Musculoskeletal  (+) Fibromyalgia - (" all over ") Hematology   Peds  Reproductive/Obstetrics   Anesthesia Other Findings             Anesthesia Physical Anesthesia Plan  ASA: III  Anesthesia Plan: Spinal   Post-op Pain Management:    Induction: Intravenous  Airway Management Planned: Mask  Additional Equipment:   Intra-op Plan:   Post-operative Plan:   Informed Consent: I have reviewed the patients History and Physical, chart, labs and discussed the procedure including the risks, benefits and alternatives for the proposed anesthesia with the patient or authorized representative who has indicated his/her understanding and acceptance.     Plan Discussed with: Anesthesiologist (AP)  Anesthesia Plan Comments: (Pt. Requests Regional anesthesia She was instructed to take Tenormin, Synthroid on DOS   Discussed spinal, and patient consents to the procedure:  included risk of possible headache,backache, failed block, allergic reaction, and nerve injury. This patient was asked if she had any questions or concerns before the procedure started. plts okay. NPO  )      Anesthesia Quick Evaluation

## 2011-06-27 NOTE — Patient Instructions (Addendum)
[address]  06/27/2011   Your procedure is scheduled on:  06/29/2011  Report to [facility] at 0900 AM.  Call this number if you have problems the morning of surgery: [phone]   Remember:   Do not eat food:After Midnight.  Do not drink clear liquids: After Midnight.  Take these medicines the morning of surgery with A SIP OF WATER: Atenlol and levothyroxine   Do not wear jewelry, make-up or nail polish.  Do not bring valuables to the hospital.  Contacts, dentures or bridgework may not be worn into surgery.  Leave suitcase in the car. After surgery it may be brought to your room.  For patients admitted to the hospital, checkout time is 11:00 AM the day of discharge.   Patients discharged the day of surgery will not be allowed to drive home.  Name and phone number of your driver: Jewel Baize  Special Instructions: Hibiclens bath the night before your surgery and the morning of your surgery.  See instruction sheet   Please read over the following fact sheets that you were given: Pain Booklet, Surgical Site Infection Prevention and Anesthesia Post-op Instructions

## 2011-06-29 ENCOUNTER — Encounter (HOSPITAL_COMMUNITY)
Admission: RE | Disposition: A | Discharge status: Home / Self Care | Payer: Self-pay | Source: Ambulatory Visit | Attending: Family Medicine

## 2011-06-29 ENCOUNTER — Ambulatory Visit (HOSPITAL_COMMUNITY): Payer: Medicaid Other | Admitting: Anesthesiology

## 2011-06-29 ENCOUNTER — Encounter (HOSPITAL_COMMUNITY): Payer: Self-pay | Admitting: *Deleted

## 2011-06-29 ENCOUNTER — Encounter (HOSPITAL_COMMUNITY): Payer: Self-pay | Admitting: Anesthesiology

## 2011-06-29 ENCOUNTER — Other Ambulatory Visit: Payer: Self-pay | Admitting: Family Medicine

## 2011-06-29 ENCOUNTER — Ambulatory Visit (HOSPITAL_COMMUNITY)
Admission: RE | Admit: 2011-06-29 | Discharge: 2011-06-29 | Disposition: A | Discharge status: Home / Self Care | Payer: Medicaid Other | Source: Ambulatory Visit | Attending: Family Medicine | Admitting: Family Medicine

## 2011-06-29 DIAGNOSIS — Z01812 Encounter for preprocedural laboratory examination: Secondary | ICD-10-CM | POA: Insufficient documentation

## 2011-06-29 DIAGNOSIS — Z01818 Encounter for other preprocedural examination: Secondary | ICD-10-CM | POA: Insufficient documentation

## 2011-06-29 DIAGNOSIS — N840 Polyp of corpus uteri: Secondary | ICD-10-CM

## 2011-06-29 DIAGNOSIS — N950 Postmenopausal bleeding: Secondary | ICD-10-CM

## 2011-06-29 DIAGNOSIS — K5792 Diverticulitis of intestine, part unspecified, without perforation or abscess without bleeding: Secondary | ICD-10-CM

## 2011-06-29 DIAGNOSIS — R9389 Abnormal findings on diagnostic imaging of other specified body structures: Secondary | ICD-10-CM

## 2011-06-29 HISTORY — PX: HYSTEROSCOPY WITH D & C: SHX1775

## 2011-06-29 HISTORY — PX: HYSTEROSCOPY W/D&C: SHX1775

## 2011-06-29 LAB — GLUCOSE, CAPILLARY: Glucose-Capillary: 274 mg/dL — ABNORMAL HIGH (ref 70–99)

## 2011-06-29 SURGERY — DILATATION AND CURETTAGE /HYSTEROSCOPY
Anesthesia: Choice

## 2011-06-29 MED ORDER — PROPOFOL 10 MG/ML IV EMUL
INTRAVENOUS | Status: AC
  Filled 2011-06-29: qty 20

## 2011-06-29 MED ORDER — ONDANSETRON HCL 4 MG/2ML IJ SOLN
4.0000 mg | Freq: Once | INTRAMUSCULAR | Status: AC
  Administered 2011-06-29: 4 mg via INTRAVENOUS

## 2011-06-29 MED ORDER — BUPIVACAINE-EPINEPHRINE 0.25% -1:200000 IJ SOLN
INTRAMUSCULAR | Status: DC | PRN
  Administered 2011-06-29: 20 mL

## 2011-06-29 MED ORDER — LACTATED RINGERS IV SOLN
INTRAVENOUS | Status: DC
  Administered 2011-06-29: 10:00:00 via INTRAVENOUS

## 2011-06-29 MED ORDER — EPHEDRINE 5 MG/ML INJ
INTRAVENOUS | Status: AC
  Filled 2011-06-29: qty 10

## 2011-06-29 MED ORDER — MIDAZOLAM HCL 5 MG/5ML IJ SOLN
INTRAMUSCULAR | Status: DC | PRN
  Administered 2011-06-29: 2 mg via INTRAVENOUS

## 2011-06-29 MED ORDER — CEFAZOLIN SODIUM 1 G IJ SOLR
INTRAMUSCULAR | Status: DC | PRN
  Administered 2011-06-29: 1 g via INTRAMUSCULAR

## 2011-06-29 MED ORDER — CEFAZOLIN SODIUM 1-5 GM-% IV SOLN: 1.0000 g | INTRAVENOUS | Status: DC

## 2011-06-29 MED ORDER — FENTANYL CITRATE 0.05 MG/ML IJ SOLN
INTRAMUSCULAR | Status: DC | PRN
  Administered 2011-06-29 (×2): 25 ug via INTRAVENOUS

## 2011-06-29 MED ORDER — FENTANYL CITRATE 0.05 MG/ML IJ SOLN
INTRAMUSCULAR | Status: AC
  Filled 2011-06-29: qty 2

## 2011-06-29 MED ORDER — LACTATED RINGERS IV SOLN
INTRAVENOUS | Status: DC | PRN
  Administered 2011-06-29 (×2): via INTRAVENOUS

## 2011-06-29 MED ORDER — GLYCINE 1.5 % IR SOLN
Status: DC | PRN
  Administered 2011-06-29: 1

## 2011-06-29 MED ORDER — CEFAZOLIN SODIUM 1-5 GM-% IV SOLN
INTRAVENOUS | Status: AC
  Filled 2011-06-29: qty 50

## 2011-06-29 MED ORDER — MIDAZOLAM HCL 2 MG/2ML IJ SOLN
INTRAMUSCULAR | Status: AC
  Filled 2011-06-29: qty 2

## 2011-06-29 MED ORDER — SODIUM CHLORIDE 0.9 % IR SOLN
Status: DC | PRN
  Administered 2011-06-29: 1000 mL

## 2011-06-29 MED ORDER — EPHEDRINE SULFATE 50 MG/ML IJ SOLN
INTRAMUSCULAR | Status: DC | PRN
  Administered 2011-06-29 (×2): 5 mg via INTRAVENOUS

## 2011-06-29 SURGICAL SUPPLY — 21 items
CANISTER SUCTION 2500CC (MISCELLANEOUS) ×2 IMPLANT
CATH ROBINSON RED A/P 16FR (CATHETERS) ×2 IMPLANT
CLOTH BEACON ORANGE TIMEOUT ST (SAFETY) ×2 IMPLANT
CONTAINER PREFILL 10% NBF 60ML (FORM) ×4 IMPLANT
CORD ACTIVE DISPOSABLE (ELECTRODE)
CORD ELECTRO ACTIVE DISP (ELECTRODE) IMPLANT
DRAPE UTILITY XL STRL (DRAPES) ×2 IMPLANT
ELECT LOOP GYNE PRO 24FR (CUTTING LOOP)
ELECT REM PT RETURN 9FT ADLT (ELECTROSURGICAL)
ELECT VAPORTRODE GRVD BAR (ELECTRODE) IMPLANT
ELECTRODE LOOP GYNE PRO 24FR (CUTTING LOOP) IMPLANT
ELECTRODE REM PT RTRN 9FT ADLT (ELECTROSURGICAL) IMPLANT
GLOVE BIOGEL PI IND STRL 7.0 (GLOVE) ×1 IMPLANT
GLOVE BIOGEL PI INDICATOR 7.0 (GLOVE) ×1
GLOVE ECLIPSE 7.0 STRL STRAW (GLOVE) ×4 IMPLANT
GOWN BRE IMP SLV AUR LG STRL (GOWN DISPOSABLE) ×2 IMPLANT
GOWN BRE IMP SLV AUR XL STRL (GOWN DISPOSABLE) ×2 IMPLANT
PACK HYSTEROSCOPY LF (CUSTOM PROCEDURE TRAY) ×2 IMPLANT
TOWEL OR 17X24 6PK STRL BLUE (TOWEL DISPOSABLE) ×4 IMPLANT
TUBING HYDROFLEX HYSTEROSCOPY (TUBING) ×1 IMPLANT
WATER STERILE IRR 1000ML POUR (IV SOLUTION) ×2 IMPLANT

## 2011-06-29 NOTE — H&P (Signed)
  Subjective:    Patient is a 60 y.o. female scheduled for D & C, Hysteroscopy. Indications for procedure are postmenopausal bleeding.  Onset of symptoms was 3 monthsago. Symptoms have been unchanged since that time. She has had a pinkish discharge for some time.   Patient denies fever or chills. Symptoms are aggravated by nothing. Symptoms improve with nothing. Past history includes Obesity, Diabetes. Previous studies include CT and Ultrasound, revealed thickened endometrium 8 mm, but otherwise normal.  Endometrial sampling showed benign polyps.  Pertinent Gynecological History:  Menses: flow is light and post-menopausal Contraception: none DES exposure: denies Blood transfusions: none Sexually transmitted diseases: no past history Last mammogram: unknown  Last pap: normal Date: 02/13/2011  Discussed Blood/Blood Products: no  Menstrual History: OB History    Grav Para Term Preterm Abortions TAB SAB Ect Mult Living                   No LMP recorded. Patient is postmenopausal.    The following portions of the patient's history were reviewed and updated as appropriate: allergies, current medications, past family history, past medical history, past social history, past surgical history and problem list.  Review of Systems Constitutional: negative Respiratory: negative Cardiovascular: negative Gastrointestinal: positive for abdominal pain, dyspepsia and diarrhea Genitourinary:positive for postmenopausal bleeding Musculoskeletal:negative Behavioral/Psych: negative Endocrine: positive for diabetic symptoms including polydipsia, polyuria and poor blood sugar control    Objective:    There were no vitals taken for this visit.  General:   alert, cooperative, appears stated age and obese  Skin:   normal  HEENT:  PERRLA and neck supple with midline trachea  Lungs:   clear to auscultation bilaterally  Heart:   regular rate and rhythm, S1, S2 normal, no murmur, click, rub or gallop      Abdomen:  soft, non-tender; bowel sounds normal; no masses,  no organomegaly  Pelvis:  External genitalia: normal general appearance Urinary system: urethral meatus normal and bladder not palpable Vaginal: normal without tenderness, induration or masses and normal rugae Cervix: normal appearance Adnexa: normal bimanual exam Uterus: normal single, nontender and retroverted  Extremeties No C/C/E                             Assessment:     Postmenopausal bleeding      Plan:    Counseling: Procedure, risks, reasons, benefits and complications (including injury to bowel, bladder, major blood vessel,  bleeding, possibility of transfusion, infection) reviewed in detail. Consent signed. Preop testing ordered. Instructions reviewed, including NPO after midnight.

## 2011-06-29 NOTE — Anesthesia Postprocedure Evaluation (Signed)
  Anesthesia Post-op Note  Patient: [name]  Procedure(s) Performed:  DILATATION AND CURRETTAGE (D&C) /HYSTEROSCOPY Patient is awake and responsive. Pain and nausea are reasonably well controlled. Vital signs are stable and clinically acceptable. Oxygen saturation is clinically acceptable. There are no apparent anesthetic complications at this time. Patient is ready for discharge.

## 2011-06-29 NOTE — Procedures (Signed)
Preoperative diagnoses:  Postmenopausal bleeding Postoperative diagnosis: Same Procedure: D & C, hysteroscopy Surgeon: Shelbie Proctor. Shawnie Pons, MD Anesthesia: Sonny Dandy, MD Findings: Normal-appearing endometrium Estimated blood loss: Minimum Pathology: Endometrial curettings Indications for procedure: Patient is a 60 year old gravida 5 para 3023, who is experiencing postmenopausal bleeding. She is undergone CT of the abdomen and pelvis and pelvic sonography which revealed a thickened endometrial stripe at 8 mm. She is undergone office endometrial biopsy. This biopsy showed benign endometrial polyps. She continues to have postmenopausal bleeding and is for diagnostic D&C and hysteroscopy today. Procedure: The patient was taken to the operating room where spinal analgesia was inserted. She did receive 1 g of Ancef prior to procedure. SCDs were in place.  Time out was performed. Patient was placed in dorsolithotomy announce stirrups. She was prepped and draped in the usual sterile fashion. A Red Rubber catheter was used to drain her bladder. A segment placement of the vagina. The cervix was visualized anteriorly and grasped with a single-tooth tenaculum. Paracervical block was performed with 1% lidocaine plain with 20 cc injected. The uterus sounded to 12 cm. Sequential dilation was performed with Shawnie Pons dilators. The hysteroscope was inserted and the endometrial cavity and inspected. There were no abnormal findings noted in the endometrial cavity with both ostia seen. Given the normal endometrial cavity the hysteroscope was removed. Sharp curettage was performed in all 4 quadrants. All instruments were removed from the vagina. All instrument, needle and lap counts were correct x2. The patient was awakened and is recovering in stable condition.

## 2011-06-29 NOTE — Transfer of Care (Signed)
Immediate Anesthesia Transfer of Care Note  Patient: [name]  Procedure(s) Performed:  DILATATION AND CURRETTAGE (D&C) /HYSTEROSCOPY  Patient Location: PACU  Anesthesia Type: Spinal  Level of Consciousness: awake, alert  and oriented  Airway & Oxygen Therapy: Patient Spontanous Breathing and Patient connected to nasal cannula oxygen  Post-op Assessment: Report given to PACU RN and Post -op Vital signs reviewed and stable  Post vital signs: Reviewed and stable  Complications: No apparent anesthesia complications

## 2011-06-29 NOTE — Anesthesia Procedure Notes (Signed)
Spinal Block  Patient location during procedure: OR Staffing Anesthesiologist: Jiles Garter Preanesthetic Checklist Completed: patient identified, site marked, surgical consent, pre-op evaluation, timeout performed, IV checked, risks and benefits discussed and monitors and equipment checked Spinal Block Patient position: sitting Prep: DuraPrep Patient monitoring: heart rate, cardiac monitor, continuous pulse ox and blood pressure Approach: midline Location: L3-4 Injection technique: single-shot Needle Needle type: Sprotte  Needle gauge: 24 G Needle length: 9 cm Assessment Sensory level: T6 Additional Notes 60 Mg Lidocaine plain

## 2011-07-02 ENCOUNTER — Telehealth: Payer: Self-pay | Admitting: *Deleted

## 2011-07-02 ENCOUNTER — Ambulatory Visit: Payer: Medicaid Other | Admitting: Family Medicine

## 2011-07-02 NOTE — Telephone Encounter (Signed)
Pt called and left a message on nurse line stating that she had a D&C on Friday July 13. She is scheduled for her post op followup on August 23. She wanted to know her pathology results from the [facility] prior to this appt. As of today the results are not yet back. Called pt back and got her voicemail, left her a message to call us back.

## 2011-07-03 NOTE — Telephone Encounter (Signed)
Path shows benign polyp only.

## 2011-07-03 NOTE — Telephone Encounter (Signed)
Dr. Shawnie Pons, Please see note from patient call.  Would you call her once results are available or send to our pool with what you would like for [name] to tell the patient and we will call her.

## 2011-07-04 NOTE — Telephone Encounter (Signed)
Dr. Shawnie Pons, I spoke with this patient just a moment ago.  She has a lot of questions and is afraid to wait until her appointment on 08/09/11.  She states she is experiencing cramping which is worse now than it was before her surgery on 06/29/11.  I told her it could be related to her surgery.  She states she wonders if maybe she has ovarian cancer and that could be causing her bleeding.  She also wanted to know if she is still bleeding when she comes in on 08/09/11 what will be the next step.  She doesn't want a hysterectomy but states if that is what she needs she would rather do that now.  She has an 74 year old granddaughter who lives with her and if she waits until August the granddaughter would be back in school.  She has lots of questions.  Please let me know if you would like for me to call her back or if you would rather call her. Thanks, [employer]

## 2011-07-04 NOTE — Telephone Encounter (Signed)
Returned patient's call, she is having watery pink discharge similar to prior to surgery but more in volume.  Previously was red and now is just pink.  Hopefully will stop in next few days.  She will f/u as previously scheduled and let us know if her issues do not resolve.

## 2011-07-04 NOTE — Addendum Note (Signed)
Addendum  created 07/04/11 1428 by Jiles Garter   Modules edited:Inpatient Notes

## 2011-07-17 ENCOUNTER — Encounter (HOSPITAL_COMMUNITY): Payer: Self-pay | Admitting: Family Medicine

## 2011-08-09 ENCOUNTER — Encounter: Payer: Self-pay | Admitting: Family Medicine

## 2011-08-09 ENCOUNTER — Ambulatory Visit (INDEPENDENT_AMBULATORY_CARE_PROVIDER_SITE_OTHER): Payer: Medicaid Other | Admitting: Family Medicine

## 2011-08-09 VITALS — BP 143/82 | HR 88 | Temp 98.4°F | Ht 63.0 in | Wt 226.2 lb

## 2011-08-09 DIAGNOSIS — N898 Other specified noninflammatory disorders of vagina: Secondary | ICD-10-CM

## 2011-08-09 DIAGNOSIS — Z09 Encounter for follow-up examination after completed treatment for conditions other than malignant neoplasm: Secondary | ICD-10-CM

## 2011-08-09 NOTE — Patient Instructions (Signed)
Postmenopausal Bleeding Menopause is the gradual end of ovulation and menstrual periods. Postmenopausal bleeding is bleeding from the uterus 12 months or more after menstrual periods have stopped. Postmenopausal bleeding is different from the few, irregular periods that happen around the time of menopause. Postmenopausal bleeding is common, but not normal. Tell your caregiver if you have any bleeding after menopause.  CAUSES  Hormone therapy (HRT). This is the most common cause of postmenopausal bleeding.  Cervical, ovarian or endometrial cancer.   Thinning of the uterus endometrium (uterine atrophy).   Thyroid and other medical diseases.   Medications other than hormones.   Endometrium infection (endometritis).  Estrogen-secreting tumors in other parts of the body.   Cervical lesions (polps/infection).   Endometrial polyps.   Uterine tumors (fibroid/polyps).   Being very overweight (obese).   SYMPTOMS Postmenopausal bleeding can start in different parts of the reproductive system.   Bleeding from the vagina may happen. This happens because when estrogen secretion stops, the vagina dries out and can weaken (atrophy). This is the most common cause of bleeding from the lower reproductive tract.   Lesions and cracks on the vulva may also bleed.   Sometimes bleeding happens after sexual intercourse.   Bleeding from the cervix if there are polyps, cancer or an infection.   Bleeding can happen with or without a related infection.  DIAGNOSIS  The caregiver will ask for a detailed history of how long bleeding has been going on. A woman should keep a record of the time, the length, how often and amount of bleeding. She should tell the caregiver about any medications she is taking, especially any hormones or steroids.   The caregiver will do a pelvic exam and PAP test. The vulva and vagina are looked at for signs of atrophy. The caregiver will feel for any sign of uterine fibroids.  Depending on the exam results, more testing may be needed.  PROCEDURES  Biopsies may be taken. This is when the caregiver studies a piece of tissue from the body. They look for tissue that is not normal.   Endometrial biopsy takes tissue from the uterine lining.   Cervical biopsy takes tissue from the cervix.   Dilatation and curettage (D & C) is often needed for a diagnosis. This is the best test for diagnosing uterine cancer.   Hysteroscopy is a procedure that uses a long tube with a light. This tube looks inside the uterus.   A vaginal probe ultrasound may be done. This measures the thickness of the endometrium. This procedure does not often show polyps and fibroids in the uterus however.   A saline infusion sonohysterography (SIS) may be done. A salt water (saline) solution is injected into the uterus with a small tube (catheter) before a vaginal probe is put in. The presence of liquid in the uterus helps make any fibroids or polyps more obvious.  TREATMENT Treatment depends on the cause of the bleeding.  It is common for women just beginning HRT to have some bleeding. Most women who are on HRT also take progesterone with estrogen if they still have their uterus. They may have monthly withdrawal bleeding. This is a normal side effect that often does not need treatment. Taking a low dose of estrogen and progesterone daily can prevent having a menstrual period.   Postmenopausal bleeding due to vulva or vaginal bleeding can be treated with estrogen creams, patches or pills.   If cancer is found, some form of surgery is needed. The uterus,   cervix, ovaries and fallopian tubes may all be removed depending on the type and location of the cancer. If the problem is estrogen or androgen-producing tumors elsewhere in the body, these must also be surgically removed.   Postmenopausal bleeding that is not due to cancer and cannot be controlled by any other treatment usually requires a hysterectomy.  This operation is not without risk and possible complications.   If the cause is endometrium thinning, hormones are given.   If there is endometrial hyperplasia, progesterone may be given and/or D and C may be done.   If there are polyps in the uterus, a hysteroscopy and/or D and C is done to remove them.   If fibroids are present, they can be removed or a hysterectomy may be needed.   If the problem is medical, thyroid or other medical treatment is done.   If medication is causing the problem, changing or stopping the medication may be needed.  HOME CARE INSTRUCTIONS  Postmenopausal bleeding is not a preventable disorder. However, maintaining a healthy weight will decrease the chances of it happening.   See your caregiver if you are missing menstrual periods all the time.   A PAP smear is done to screen for cervical cancer.   The first PAP smear should be done at age 21.   Between ages 21 and 29, PAP smears are repeated every 2 years.   Beginning at age 30, you are advised to have a PAP smear every 3 years as long as your past 3 PAP smears have been normal.   Some women have medical problems that increase the chance of getting cervical cancer. Talk to your caregiver about these problems. It is especially important to talk to your caregiver if a new problem develops soon after your last PAP smear. In these cases, your caregiver may recommend more frequent screening and Pap smears.   The above recommendations are the same for women who have or have not gotten the vaccine for HPV (Human Papillomavirus).   If you had a hysterectomy for a problem that was not a cancer or a condition that could lead to cancer, then you no longer need Pap smears.   If you are between ages 65 and 70, and you have had normal Pap smears going back 10 years, you no longer need Pap smears.   If you have had past treatment for cervical cancer or a condition that could lead to cancer, you need Pap smears and  screening for cancer for at least 20 years after your treatment.   See your caregiver if you have any kind of bleeding after menopause.  SEEK MEDICAL CARE IF:  You have any kind of vaginal bleeding after menopause.   You develop bleeding with sexual intercourse.   You are still having menstrual periods after age 55.   You are gaining too much weight.  Document Released: 03/13/2006 Document Re-Released: 12/25/2009 [employer] Patient Information 2011 [employer]. 

## 2011-08-09 NOTE — Progress Notes (Signed)
  Subjective:    Patient ID: [name], female    DOB: 14-Jun-1951, 60 y.o.   MRN: [id number]  HPI Comments: Also here for post op and doing well.  Vaginal Discharge The patient's primary symptoms include a vaginal discharge. This is a chronic problem. The current episode started 1 to 4 weeks ago. The problem occurs constantly. The problem has been unchanged. The patient is experiencing no pain. The problem affects the left side. She is not pregnant. Pertinent negatives include no abdominal pain, fever, urgency or vomiting. The vaginal discharge was brown. There has been no bleeding. She has not been passing clots. She has not been passing tissue.      Review of Systems  Constitutional: Negative for fever.  Gastrointestinal: Negative for vomiting and abdominal pain.  Genitourinary: Positive for vaginal discharge. Negative for urgency.       Objective:   Physical Exam  Constitutional: She appears well-developed and well-nourished.  HENT:  Head: Normocephalic.  Cardiovascular: Normal rate.   Pulmonary/Chest: Effort normal.  Abdominal: Soft.  Genitourinary: No vaginal discharge found.       Atrophic vagina          Assessment & Plan:  Beige vaginal discharge of unclear etiology Check Wet prep I see no source of problem related to post-menopausal bleeding, her path is reviewed and negative, her u/s and CT were negative.

## 2011-08-10 LAB — WET PREP, GENITAL
Clue Cells Wet Prep HPF POC: NONE SEEN
Trich, Wet Prep: NONE SEEN
Yeast Wet Prep HPF POC: NONE SEEN

## 2011-08-15 NOTE — Progress Notes (Signed)
Left message for pt to call us back. 

## 2012-04-16 ENCOUNTER — Encounter: Payer: Self-pay | Admitting: Internal Medicine

## 2012-12-01 ENCOUNTER — Encounter (HOSPITAL_COMMUNITY): Payer: Self-pay | Admitting: Emergency Medicine

## 2012-12-01 ENCOUNTER — Emergency Department (HOSPITAL_COMMUNITY)
Admission: EM | Admit: 2012-12-01 | Discharge: 2012-12-02 | Disposition: A | Discharge status: Home / Self Care | Payer: Medicaid Other | Attending: Emergency Medicine | Admitting: Emergency Medicine

## 2012-12-01 DIAGNOSIS — I129 Hypertensive chronic kidney disease with stage 1 through stage 4 chronic kidney disease, or unspecified chronic kidney disease: Secondary | ICD-10-CM | POA: Insufficient documentation

## 2012-12-01 DIAGNOSIS — E039 Hypothyroidism, unspecified: Secondary | ICD-10-CM | POA: Insufficient documentation

## 2012-12-01 DIAGNOSIS — H53149 Visual discomfort, unspecified: Secondary | ICD-10-CM | POA: Insufficient documentation

## 2012-12-01 DIAGNOSIS — Z8659 Personal history of other mental and behavioral disorders: Secondary | ICD-10-CM | POA: Insufficient documentation

## 2012-12-01 DIAGNOSIS — Z79899 Other long term (current) drug therapy: Secondary | ICD-10-CM | POA: Insufficient documentation

## 2012-12-01 DIAGNOSIS — Z87820 Personal history of traumatic brain injury: Secondary | ICD-10-CM | POA: Insufficient documentation

## 2012-12-01 DIAGNOSIS — Z8679 Personal history of other diseases of the circulatory system: Secondary | ICD-10-CM | POA: Insufficient documentation

## 2012-12-01 DIAGNOSIS — H10219 Acute toxic conjunctivitis, unspecified eye: Secondary | ICD-10-CM

## 2012-12-01 DIAGNOSIS — H538 Other visual disturbances: Secondary | ICD-10-CM | POA: Insufficient documentation

## 2012-12-01 DIAGNOSIS — Z8709 Personal history of other diseases of the respiratory system: Secondary | ICD-10-CM | POA: Insufficient documentation

## 2012-12-01 DIAGNOSIS — N189 Chronic kidney disease, unspecified: Secondary | ICD-10-CM | POA: Insufficient documentation

## 2012-12-01 DIAGNOSIS — J45901 Unspecified asthma with (acute) exacerbation: Secondary | ICD-10-CM | POA: Insufficient documentation

## 2012-12-01 DIAGNOSIS — H5789 Other specified disorders of eye and adnexa: Secondary | ICD-10-CM | POA: Insufficient documentation

## 2012-12-01 DIAGNOSIS — Z8739 Personal history of other diseases of the musculoskeletal system and connective tissue: Secondary | ICD-10-CM | POA: Insufficient documentation

## 2012-12-01 DIAGNOSIS — Z8669 Personal history of other diseases of the nervous system and sense organs: Secondary | ICD-10-CM | POA: Insufficient documentation

## 2012-12-01 DIAGNOSIS — Z8701 Personal history of pneumonia (recurrent): Secondary | ICD-10-CM | POA: Insufficient documentation

## 2012-12-01 DIAGNOSIS — Z8719 Personal history of other diseases of the digestive system: Secondary | ICD-10-CM | POA: Insufficient documentation

## 2012-12-01 DIAGNOSIS — E119 Type 2 diabetes mellitus without complications: Secondary | ICD-10-CM | POA: Insufficient documentation

## 2012-12-01 MED ORDER — FLUORESCEIN SODIUM 1 MG OP STRP
1.0000 | ORAL_STRIP | Freq: Once | OPHTHALMIC | Status: AC
  Administered 2012-12-02: 1 via OPHTHALMIC
  Filled 2012-12-01: qty 2

## 2012-12-01 MED ORDER — TETRACAINE HCL 0.5 % OP SOLN
2.0000 [drp] | Freq: Once | OPHTHALMIC | Status: AC
  Administered 2012-12-02: 2 [drp] via OPHTHALMIC
  Filled 2012-12-01: qty 2

## 2012-12-01 NOTE — ED Notes (Signed)
PT to move to Pod -C room 22

## 2012-12-01 NOTE — ED Provider Notes (Signed)
History   This chart was scribed for [name], MD by [name], ED Scribe. This patient was seen in room TR04C/TR04C and the patient's care was started at 10:37 PM    CSN: [id number]  Arrival date & time 12/01/12  2044   None     Chief Complaint  Patient presents with  . Eye Pain     The history is provided by the patient. No language interpreter was used.   [name] is a 61 y.o. female with h/o HTN, dysrhythmia, asthma, CKD, and DM who presents to the Emergency Department complaining of severe pain and redness in bilateral eyes after lice shampoo dripped down into her eyes while washing her hair at 7:00 PM tonight with associated blurred vision, photophobia, and nausea secondary to pain.  Pt states the pain was instantaneous when the shampoo got into her eyes and has been constant since.  Pt states she flushed her eyes with water for 10 minutes afterwards.  Pt is unsure of last tetanus shot.  Pt denies tobacco and alcohol use.    PCP is Dr. Catha Gosselin   Past Medical History  Diagnosis Date  . Hypertension   . Dysrhythmia     "skips beats"  . Shortness of breath     exertion  . Asthma     dianosed years ago-no meds at this time  . Pneumonia 2009    several times over the past several years  . Hypothyroidism   . Chronic kidney disease 1997    nepthrotic syndrom with minimal change  . Irritable bowel syndrome 1980's  . Headache 1980's    on meds for migraines-well controlled  . Fibromyalgia 1980's  . Anxiety     "comes and goes"  . Depression   . Morbid obesity   . Head injury 2010  . Recurrent upper respiratory infection (URI)   . Diabetes mellitus     Past Surgical History  Procedure Date  . Dilation and curettage of uterus 1972  . Cholecystectomy 1983  . Hysteroscopy w/d&c 06/29/2011    Procedure: DILATATION AND CURETTAGE (D&C) /HYSTEROSCOPY;  Surgeon: Reva Bores, MD;  Location: [facility];  Service: Gynecology;  Laterality: N/A;    Family  History  Problem Relation Age of Onset  . Cancer Father     History  Substance Use Topics  . Smoking status: Never Smoker   . Smokeless tobacco: Never Used  . Alcohol Use: No  Lives at home   OB History    Grav Para Term Preterm Abortions TAB SAB Ect Mult Living   5 3 3  0 2  2   3       Review of Systems  Eyes: Positive for photophobia, pain, redness and visual disturbance.  All other systems reviewed and are negative.    Allergies  Metformin and related; Sulfamethoxazole; and Sulfa antibiotics  Home Medications   Current Outpatient Rx  Name  Route  Sig  Dispense  Refill  . AMITRIPTYLINE HCL 100 MG PO TABS   Oral   Take 50 mg by mouth at bedtime.           . ATENOLOL 25 MG PO TABS   Oral   Take 75 mg by mouth daily.          Marland Kitchen VITAMIN D3 2000 UNITS PO TABS   Oral   Take 1 capsule by mouth daily.           Marland Kitchen GLIMEPIRIDE 4 MG  PO TABS   Oral   Take 4 mg by mouth every morning.           Marland Kitchen LEVOTHYROXINE SODIUM 50 MCG PO TABS   Oral   Take 50 mcg by mouth daily.          Marland Kitchen SPIRONOLACTONE-HCTZ 25-25 MG PO TABS   Oral   Take 1 tablet by mouth daily.            BP 135/76  Pulse 84  Temp 98 F (36.7 C) (Oral)  Resp 14  SpO2 100%  Vital signs normal    Physical Exam  Nursing note and vitals reviewed. Constitutional: She is oriented to person, place, and time. She appears well-developed and well-nourished.  Non-toxic appearance. She does not appear ill. No distress.  HENT:  Head: Normocephalic and atraumatic.  Right Ear: External ear normal.  Left Ear: External ear normal.  Nose: Nose normal. No mucosal edema or rhinorrhea.  Mouth/Throat: Mucous membranes are normal. No dental abscesses or uvula swelling.  Eyes: EOM are normal. Pupils are equal, round, and reactive to light. Right eye exhibits chemosis and discharge. Right eye exhibits no exudate and no hordeolum. No foreign body present in the right eye. Left eye exhibits chemosis and  discharge. Left eye exhibits no exudate and no hordeolum. No foreign body present in the left eye. Right conjunctiva is injected. Right conjunctiva has no hemorrhage. Left conjunctiva is injected. Left conjunctiva has no hemorrhage. No scleral icterus.       Diffuse, intense conjunctival injection in bilateral eyes. Slitlamp exam: no uptake of fluorescence stain.  After tetracain drops placed in her eyes she is able to hold eyes open.  Neck: Normal range of motion and full passive range of motion without pain. Neck supple.  Pulmonary/Chest: Effort normal. No respiratory distress. She has no rhonchi. She exhibits no crepitus.  Abdominal: Normal appearance.  Musculoskeletal: Normal range of motion. She exhibits no edema and no tenderness.       Moves all extremities well.   Neurological: She is alert and oriented to person, place, and time. She has normal strength. No cranial nerve deficit.  Skin: Skin is warm, dry and intact. No rash noted. No erythema. No pallor.  Psychiatric: She has a normal mood and affect. Her speech is normal and behavior is normal. Her mood appears not anxious.    ED Course  Procedures (including critical care time)   Medications  aspirin 325 MG tablet (not administered)  tetracaine (PONTOCAINE) 0.5 % ophthalmic solution 2 drop (not administered)  fluorescein ophthalmic strip 1 strip (not administered)  tobramycin (TOBREX) 0.3 % ophthalmic ointment (not administered)  HYDROcodone-acetaminophen (NORCO/VICODIN) 5-325 MG per tablet (not administered)    DIAGNOSTIC STUDIES: Oxygen Saturation is 100% on room air, normal by my interpretation.    COORDINATION OF CARE: 10:40 PM- Informed pt that I would return with fluorescein test.   12:12 AM- Returned to room.   Pt's eyes were irrigated by nursing staff, still has a lot of pain. Tetracaine was placed in eyes to do flourscein stain and she is now able to open her eyes.  Poison control was contacted when she arrived  and after she was irrigated.   12:19 AM- Informed pt that there are no abrasions or further injuries to her eyes.  Discussed discharge.   1. Chemical conjunctivitis     New Prescriptions   HYDROCODONE-ACETAMINOPHEN (NORCO/VICODIN) 5-325 MG PER TABLET    Take 2 tablets by mouth every 6 (  six) hours as needed for pain.    Plan discharge  Devoria Albe, MD, FACEP   MDM    I personally performed the services described in this documentation, which was scribed in my presence. The recorded information has been reviewed and considered.  }       [name], MD 12/02/12 [phone]

## 2012-12-01 NOTE — ED Notes (Signed)
PT. REPORTS BILATERAL EYE IRRITATION WITH PAIN /REDDNESS ,SLIGHT NAUSEA AFTER USING LICE SHAMPOO THIS EVENING . ALSO REPORTS SLIGHT BLURRED VISION .

## 2012-12-01 NOTE — ED Notes (Signed)
Pt tol. Morgan lens irrigation well.

## 2012-12-01 NOTE — ED Notes (Signed)
Poison Control called to report eye contact with equate lice killing shampoo. Irrigation was recommended for 15-20 mins and let eye rest for 20 mins . If eye burning continues eye exam is recommended .

## 2012-12-02 MED ORDER — HYDROCODONE-ACETAMINOPHEN 5-325 MG PO TABS: 2.0000 | ORAL_TABLET | Freq: Four times a day (QID) | ORAL | Status: DC | PRN

## 2012-12-02 MED ORDER — TOBRAMYCIN 0.3 % OP OINT
TOPICAL_OINTMENT | Freq: Four times a day (QID) | OPHTHALMIC | Status: DC
  Administered 2012-12-02: 01:00:00 via OPHTHALMIC
  Filled 2012-12-02: qty 3.5

## 2012-12-02 NOTE — ED Notes (Signed)
Poison control called regarding the continued pain to her eyes.  Informed them no abrasions noted  Stated that they would expect her eyes to burn and there was nothing more to do.  No further irrigation needed.

## 2014-10-08 ENCOUNTER — Telehealth: Payer: Self-pay | Admitting: Internal Medicine

## 2014-10-11 NOTE — Telephone Encounter (Signed)
Path results discussed with pt and she is aware of Colon recall.

## 2014-10-18 ENCOUNTER — Encounter (HOSPITAL_COMMUNITY): Payer: Self-pay | Admitting: Emergency Medicine

## 2015-05-18 ENCOUNTER — Other Ambulatory Visit: Payer: Self-pay | Admitting: Family Medicine

## 2015-05-18 DIAGNOSIS — Z1231 Encounter for screening mammogram for malignant neoplasm of breast: Secondary | ICD-10-CM

## 2015-06-15 ENCOUNTER — Encounter: Payer: Self-pay | Admitting: Family Medicine

## 2015-06-15 ENCOUNTER — Other Ambulatory Visit: Payer: Self-pay | Admitting: Family Medicine

## 2015-06-15 ENCOUNTER — Ambulatory Visit (HOSPITAL_COMMUNITY)
Admission: RE | Admit: 2015-06-15 | Discharge: 2015-06-15 | Disposition: A | Discharge status: Home / Self Care | Payer: Commercial Managed Care - HMO | Source: Ambulatory Visit | Attending: Family Medicine | Admitting: Family Medicine

## 2015-06-15 ENCOUNTER — Ambulatory Visit (HOSPITAL_COMMUNITY): Payer: Self-pay

## 2015-06-15 ENCOUNTER — Other Ambulatory Visit (HOSPITAL_COMMUNITY)
Admission: RE | Admit: 2015-06-15 | Discharge: 2015-06-15 | Disposition: A | Discharge status: Home / Self Care | Payer: Commercial Managed Care - HMO | Source: Ambulatory Visit | Attending: Family Medicine | Admitting: Family Medicine

## 2015-06-15 ENCOUNTER — Ambulatory Visit (INDEPENDENT_AMBULATORY_CARE_PROVIDER_SITE_OTHER): Payer: Commercial Managed Care - HMO | Admitting: Family Medicine

## 2015-06-15 VITALS — BP 136/76 | HR 77 | Temp 98.0°F | Wt 199.9 lb

## 2015-06-15 DIAGNOSIS — Z01419 Encounter for gynecological examination (general) (routine) without abnormal findings: Secondary | ICD-10-CM

## 2015-06-15 DIAGNOSIS — Z1231 Encounter for screening mammogram for malignant neoplasm of breast: Secondary | ICD-10-CM | POA: Insufficient documentation

## 2015-06-15 DIAGNOSIS — Z124 Encounter for screening for malignant neoplasm of cervix: Secondary | ICD-10-CM

## 2015-06-15 DIAGNOSIS — N842 Polyp of vagina: Secondary | ICD-10-CM | POA: Diagnosis not present

## 2015-06-15 DIAGNOSIS — N950 Postmenopausal bleeding: Secondary | ICD-10-CM

## 2015-06-15 DIAGNOSIS — Z1151 Encounter for screening for human papillomavirus (HPV): Secondary | ICD-10-CM

## 2015-06-15 NOTE — Progress Notes (Signed)
  Subjective:     [name] is a 64 y.o. female and is here for a comprehensive physical exam. The patient reports problems - dizziness and forgetfulness.  History   Social History  . Marital Status: Legally Separated    Spouse Name: N/A  . Number of Children: N/A  . Years of Education: N/A   Occupational History  . Not on file.   Social History Main Topics  . Smoking status: Never Smoker   . Smokeless tobacco: Never Used  . Alcohol Use: No  . Drug Use: No  . Sexual Activity: Not Currently   Other Topics Concern  . Not on file   Social History Narrative   Health Maintenance  Topic Date Due  . HEMOGLOBIN A1C  07/31/1951  . FOOT EXAM  10/11/1959  . OPHTHALMOLOGY EXAM  10/11/1959  . URINE MICROALBUMIN  10/11/1959  . HIV Screening  10/10/1964  . TETANUS/TDAP  10/10/1968  . MAMMOGRAM  10/11/1999  . COLONOSCOPY  10/11/1999  . ZOSTAVAX  10/10/2009  . DEXA SCAN  10/10/2014  . PNA vac Low Risk Adult (1 of 2 - PCV13) 10/10/2014  . INFLUENZA VACCINE  07/18/2015    The following portions of the patient's history were reviewed and updated as appropriate: allergies, current medications, past family history, past medical history, past social history, past surgical history and problem list.  Review of Systems Pertinent items are noted in HPI. They are otherwise negative.  Objective:    BP 136/76 mmHg  Pulse 77  Temp(Src) 98 F (36.7 C)  Wt 199 lb 14.4 oz (90.674 kg) General appearance: alert, cooperative and appears stated age Head: Normocephalic, without obvious abnormality, atraumatic Neck: no adenopathy, supple, symmetrical, trachea midline and thyroid not enlarged, symmetric, no tenderness/mass/nodules Lungs: clear to auscultation bilaterally Breasts: normal appearance, no masses or tenderness Heart: regular rate and rhythm, S1, S2 normal, no murmur, click, rub or gallop Abdomen: soft, non-tender; bowel sounds normal; no masses,  no organomegaly Pelvic:  cervix normal in appearance, external genitalia normal, no adnexal masses or tenderness, no cervical motion tenderness, uterus normal size, shape, and consistency and vaginal atrophy noted Extremities: extremities normal, atraumatic, no cyanosis or edema Pulses: 2+ and symmetric Skin: Skin color, texture, turgor normal. No rashes or lesions Lymph nodes: Cervical, supraclavicular, and axillary nodes normal. Neurologic: Mental status: Alert, oriented, thought content appropriate, Folstein mini-mental status exam results: passed    Assessment/Plan:    Healthy female exam. Menopausal. Problem List Items Addressed This Visit      Unprioritized   Post-menopausal bleeding    Only having when has irritation      Vaginal polyp    Other Visit Diagnoses    Screening for malignant neoplasm of cervix    -  Primary    Relevant Orders    Cytology - PAP    Encounter for routine gynecological examination                   See After Visit Summary for Counseling Recommendations

## 2015-06-15 NOTE — Assessment & Plan Note (Signed)
Only having when has irritation

## 2015-06-16 ENCOUNTER — Other Ambulatory Visit: Payer: Self-pay | Admitting: Family Medicine

## 2015-06-16 DIAGNOSIS — R928 Other abnormal and inconclusive findings on diagnostic imaging of breast: Secondary | ICD-10-CM

## 2015-06-16 LAB — CYTOLOGY - PAP

## 2015-06-23 ENCOUNTER — Ambulatory Visit
Admission: RE | Admit: 2015-06-23 | Discharge: 2015-06-23 | Disposition: A | Discharge status: Home / Self Care | Payer: Commercial Managed Care - HMO | Source: Ambulatory Visit | Attending: Family Medicine | Admitting: Family Medicine

## 2015-06-23 ENCOUNTER — Ambulatory Visit: Admission: RE | Admit: 2015-06-23 | Payer: Commercial Managed Care - HMO | Source: Ambulatory Visit

## 2015-06-23 DIAGNOSIS — R928 Other abnormal and inconclusive findings on diagnostic imaging of breast: Secondary | ICD-10-CM

## 2015-06-23 DIAGNOSIS — R921 Mammographic calcification found on diagnostic imaging of breast: Secondary | ICD-10-CM | POA: Diagnosis not present

## 2015-06-28 ENCOUNTER — Telehealth: Payer: Self-pay | Admitting: *Deleted

## 2015-06-28 NOTE — Telephone Encounter (Signed)
Pt left message requesting Pap results. I called her back and left a message that the results of the test she requested was normal. She should receive a letter in the mail within a month from the date of her exam. If she has additional questions she may call back.

## 2015-08-10 DIAGNOSIS — I10 Essential (primary) hypertension: Secondary | ICD-10-CM | POA: Diagnosis not present

## 2015-08-10 DIAGNOSIS — E039 Hypothyroidism, unspecified: Secondary | ICD-10-CM | POA: Diagnosis not present

## 2015-08-10 DIAGNOSIS — E782 Mixed hyperlipidemia: Secondary | ICD-10-CM | POA: Diagnosis not present

## 2015-08-10 DIAGNOSIS — E1129 Type 2 diabetes mellitus with other diabetic kidney complication: Secondary | ICD-10-CM | POA: Diagnosis not present

## 2015-08-10 DIAGNOSIS — E1122 Type 2 diabetes mellitus with diabetic chronic kidney disease: Secondary | ICD-10-CM | POA: Diagnosis not present

## 2015-08-10 DIAGNOSIS — R413 Other amnesia: Secondary | ICD-10-CM | POA: Diagnosis not present

## 2015-08-10 DIAGNOSIS — Z23 Encounter for immunization: Secondary | ICD-10-CM | POA: Diagnosis not present

## 2015-08-10 DIAGNOSIS — K635 Polyp of colon: Secondary | ICD-10-CM | POA: Diagnosis not present

## 2015-08-15 DIAGNOSIS — I10 Essential (primary) hypertension: Secondary | ICD-10-CM | POA: Diagnosis not present

## 2015-08-15 DIAGNOSIS — E039 Hypothyroidism, unspecified: Secondary | ICD-10-CM | POA: Diagnosis not present

## 2015-08-15 DIAGNOSIS — E782 Mixed hyperlipidemia: Secondary | ICD-10-CM | POA: Diagnosis not present

## 2015-08-15 DIAGNOSIS — E1129 Type 2 diabetes mellitus with other diabetic kidney complication: Secondary | ICD-10-CM | POA: Diagnosis not present

## 2015-08-15 DIAGNOSIS — Z23 Encounter for immunization: Secondary | ICD-10-CM | POA: Diagnosis not present

## 2015-08-15 DIAGNOSIS — R413 Other amnesia: Secondary | ICD-10-CM | POA: Diagnosis not present

## 2015-08-15 DIAGNOSIS — K635 Polyp of colon: Secondary | ICD-10-CM | POA: Diagnosis not present

## 2015-08-15 DIAGNOSIS — E1122 Type 2 diabetes mellitus with diabetic chronic kidney disease: Secondary | ICD-10-CM | POA: Diagnosis not present

## 2015-08-25 ENCOUNTER — Ambulatory Visit (INDEPENDENT_AMBULATORY_CARE_PROVIDER_SITE_OTHER): Payer: Commercial Managed Care - HMO | Admitting: Neurology

## 2015-08-25 ENCOUNTER — Encounter: Payer: Self-pay | Admitting: Neurology

## 2015-08-25 VITALS — BP 135/86 | HR 86 | Ht 63.0 in | Wt 200.8 lb

## 2015-08-25 DIAGNOSIS — F439 Reaction to severe stress, unspecified: Secondary | ICD-10-CM

## 2015-08-25 DIAGNOSIS — Z638 Other specified problems related to primary support group: Secondary | ICD-10-CM | POA: Diagnosis not present

## 2015-08-25 DIAGNOSIS — R413 Other amnesia: Secondary | ICD-10-CM

## 2015-08-25 DIAGNOSIS — R4789 Other speech disturbances: Secondary | ICD-10-CM | POA: Diagnosis not present

## 2015-08-25 NOTE — Patient Instructions (Addendum)
Overall you are doing fairly well but I do want to suggest a few things today:   Remember to drink plenty of fluid, eat healthy meals and do not skip any meals. Try to eat protein with a every meal and eat a healthy snack such as fruit or nuts in between meals. Try to keep a regular sleep-wake schedule and try to exercise daily, particularly in the form of walking, 20-30 minutes a day, if you can.   As far as your medications are concerned, I would like to suggest: Consider decreasing Amitriptyline, discuss with your doctor.  As far as diagnostic testing: MRI of the brain, labs  I would like to see you back in 6 months, sooner if we need to. Please call us with any interim questions, concerns, problems, updates or refill requests.   Our phone number is [phone]. We also have an after hours call service for urgent matters and there is a physician on-call for urgent questions. For any emergencies you know to call 911 or go to the nearest emergency room

## 2015-08-25 NOTE — Progress Notes (Signed)
[facility]    Provider:  Dr Jaynee Eagles Referring Provider: Hulan Fess, MD Primary Care Physician:  Gennette Pac, MD  CC:  Memory problems  HPI:  [name] is a 64 y.o. female here as a referral from Dr. Rex Kras for memory problems. She has a PMHx of DM, essential HTN, HLD, hypothyroidism, migraine, hyperlipidemia and anxiety. She is worried she has Alzheimer's dementia. She knows people who have had it and worries. Memory problems started 6 years ago. She fell from the attic and had a severe head injury and bleeding in the brain and broken ribs, spent a week in the hospital 2010. Worsening since then. She has always been bad with directions, she can never remember directions but that is not new. On occasion she has forgotten the date she had an appointment. She forgets where she puts things. She has to write notes to remind her and then she forgets where she puts the notes. She has lost keys. She will decide what she needs to do and then forgets what she was doing. On occasion she doesn't remember something from the past but for the most part the past she does remember. She has always taken notes but she feels frustrated. There is some stress in the household. [name] has PTSD and has emotional problems and daughter has schizoaffective disorder. She is under an enormous amount of stress, it is "really bad". She worries about her kids and grandkids. She denies sadness.She also has dizziness.   Reviewed notes, labs and imaging from outside physicians, which showed:  Personally reviewed imaging and agree with the following: Small amount of residual hyperdense blood layering within the occipital horn of the right lateral ventricle, nearly completely resolved. Ventricular size is stable without development of hydrocephalus. No new bleeding seen. No reversible brain parenchymal finding.  CT ORBITS and temporal bones  Findings: No fluid in the paranasal sinuses,  middle ears or mastoids. No evidence of regional fracture. No soft tissue abnormality of the orbits, orbital apices or globes is identified. Incidental note is made of degenerative disease of the temporomandibular joint on the right. There is no sign of temporal bone fracture. No inner ear abnormality is seen. Ossicles appear normal.  IMPRESSION: Negative examination.   Review of Systems: Patient complains of symptoms per HPI as well as the following symptoms: Blurred vision, snoring, hearing loss, ringing in ears, spinning sensation, joint pain, allergies, memory loss, numbness, dizziness, tremor, anxiety. Pertinent negatives per HPI. All others negative.   Social History   Social History  . Marital Status: Legally Separated    Spouse Name: N/A  . Number of Children: 3  . Years of Education: 12   Occupational History  . Retired     Social History Main Topics  . Smoking status: Never Smoker   . Smokeless tobacco: Never Used  . Alcohol Use: No  . Drug Use: No  . Sexual Activity: Not Currently   Other Topics Concern  . Not on file   Social History Narrative   Lives at home with daughter and granddaughter     Family History  Problem Relation Age of Onset  . Cancer Father   . High blood pressure    . Diabetes    . Arthritis      Past Medical History  Diagnosis Date  . Hypertension   . Dysrhythmia     "skips beats"  . Shortness of breath     exertion  . Asthma  dianosed years ago-no meds at this time  . Pneumonia 2009    several times over the past several years  . Hypothyroidism   . Chronic kidney disease 1997    nepthrotic syndrom with minimal change  . Irritable bowel syndrome 1980's  . Headache(784.0) 1980's    on meds for migraines-well controlled  . Fibromyalgia 1980's  . Anxiety     "comes and goes"  . Depression   . Morbid obesity   . Head injury 2010  . Recurrent upper respiratory infection (URI)   . Diabetes mellitus   . Hyperlipemia      Past Surgical History  Procedure Laterality Date  . Dilation and curettage of uterus  1972  . Cholecystectomy  1983  . Hysteroscopy w/d&c  06/29/2011    Procedure: DILATATION AND CURETTAGE (D&C) /HYSTEROSCOPY;  Surgeon: Donnamae Jude, MD;  Location: [facility];  Service: Gynecology;  Laterality: N/A;    Current Outpatient Prescriptions  Medication Sig Dispense Refill  . ACCU-CHEK AVIVA PLUS test strip     . ACCU-CHEK SOFTCLIX LANCETS lancets     . amitriptyline (ELAVIL) 100 MG tablet Take 50 mg by mouth at bedtime.      Marland Kitchen aspirin 325 MG tablet Take 325 mg by mouth at bedtime.    Marland Kitchen atenolol (TENORMIN) 50 MG tablet Take 50 mg by mouth daily.    . Ergocalciferol (VITAMIN D2) 2000 UNITS TABS Take 2,000 Units by mouth daily.    Marland Kitchen glimepiride (AMARYL) 4 MG tablet Take 4 mg by mouth every morning.      . INVOKANA 300 MG TABS tablet Take 300 mg by mouth daily.    Marland Kitchen levothyroxine (SYNTHROID, LEVOTHROID) 50 MCG tablet Take 50 mcg by mouth daily.     Marland Kitchen MAGNESIUM PO Take 0.5 tablets by mouth daily.    Marland Kitchen spironolactone-hydrochlorothiazide (ALDACTAZIDE) 25-25 MG per tablet Take 1 tablet by mouth daily.     . rosuvastatin (CRESTOR) 5 MG tablet Take 5 mg by mouth daily.     No current facility-administered medications for this visit.    Allergies as of 08/25/2015 - Review Complete 08/25/2015  Allergen Reaction Noted  . Cinnamon  06/15/2015  . Metformin and related Nausea And Vomiting and Other (See Comments) 06/26/2011  . Sulfamethoxazole Other (See Comments) 03/24/2007  . Sulfa antibiotics Rash and Other (See Comments) 06/16/2011    Vitals: BP 135/86 mmHg  Pulse 86  Ht 5\' 3"  (1.6 m)  Wt 200 lb 12.8 oz (91.082 kg)  BMI 35.58 kg/m2 Last Weight:  Wt Readings from Last 1 Encounters:  08/25/15 200 lb 12.8 oz (91.082 kg)   Last Height:   Ht Readings from Last 1 Encounters:  08/25/15 5\' 3"  (1.6 m)    Physical exam: Exam: Gen: NAD, conversant, well nourised, obese, well groomed                      CV: RRR, no MRG. No Carotid Bruits. No peripheral edema, warm, nontender Eyes: Conjunctivae clear without exudates or hemorrhage  Neuro: Detailed Neurologic Exam  Speech:    Speech is normal; fluent and spontaneous with normal comprehension.  Cognition: Patient is oriented to person place and time and situation, fund of knowledge appears appropriate for education level, language is fluent.  Montreal Cognitive Assessment  08/25/2015  Visuospatial/ Executive (0/5) 3  Naming (0/3) 3  Attention: Read list of digits (0/2) 2  Attention: Read list of letters (0/1) 1  Attention: Serial 7 subtraction  starting at 100 (0/3) 1  Language: Repeat phrase (0/2) 1  Language : Fluency (0/1) 1  Abstraction (0/2) 1  Delayed Recall (0/5) 2  Orientation (0/6) 6  Total 21  Adjusted Score (based on education) 22    Cranial Nerves:    The pupils are equal, round, and reactive to light. The fundi are flat. Visual fields are full to finger confrontation. Extraocular movements are intact. Trigeminal sensation is intact and the muscles of mastication are normal. The face is symmetric. The palate elevates in the midline. Hearing intact. Voice is normal. Shoulder shrug is normal. The tongue has normal motion without fasciculations.   Coordination:    Normal finger to nose and heel to shin. Normal rapid alternating movements.   Gait:    Heel-toe and tandem gait are normal.   Motor Observation:    No asymmetry, no atrophy, and no involuntary movements noted. Tone:    Normal muscle tone.    Posture:    Posture is normal. normal erect    Strength:mild hip flexion weakness. Strength is V/V in the upper and lower limbs.      Sensation: intact to LT     Reflex Exam:  DTR's: Absent AJs   Toes:    The toes are downgoing bilaterally.   Clonus:    Clonus is absent.      Assessment/Plan:   64 y.o. female here as a referral from Dr. Rex Kras for memory problems. She has a PMHx of DM, essential  HTN, HLD, hypothyroidism, migraine, hyperlipidemia and anxiety. Mitral cognitive assessment score is 22 out of 30. Reassured patient that she does not have dementia. Etiology is likely multifactorial including likely medication (amitriptyline), mild cognitive impairment, excessive current stress. She has hypothyroidism as well but last thyroid testing was normal per patient.   - follow in 6 months with repeat MoCA - MRI of the brain and labs ordered - She needs stress management - Consider decreasing amitriptyline as this medication is know to affect memory  CC: Dr. Simmie Davies, [facility] [address] [place], [place] [address]  Phone [phone] Fax [phone]

## 2015-08-28 ENCOUNTER — Encounter: Payer: Self-pay | Admitting: Neurology

## 2015-08-28 DIAGNOSIS — R413 Other amnesia: Secondary | ICD-10-CM

## 2015-08-28 DIAGNOSIS — F439 Reaction to severe stress, unspecified: Secondary | ICD-10-CM | POA: Insufficient documentation

## 2015-08-28 LAB — B12 AND FOLATE PANEL
Folate: 12.4 ng/mL (ref >3.0)
Vitamin B-12: 381 pg/mL (ref 211–946)

## 2015-08-28 LAB — VITAMIN B1: Thiamine: 174.5 nmol/L (ref 66.5–200.0)

## 2015-08-28 LAB — RPR: RPR Ser Ql: NONREACTIVE

## 2015-08-28 LAB — METHYLMALONIC ACID, SERUM: Methylmalonic Acid: 193 nmol/L (ref 0–378)

## 2015-08-28 LAB — HIV ANTIBODY (ROUTINE TESTING W REFLEX): HIV Screen 4th Generation wRfx: NONREACTIVE

## 2015-08-30 ENCOUNTER — Telehealth: Payer: Self-pay | Admitting: *Deleted

## 2015-08-30 NOTE — Telephone Encounter (Signed)
Error

## 2015-08-30 NOTE — Telephone Encounter (Signed)
Called and spoke w/ pt about normal lab results. Pt verbalized understanding. Stated she had MRI scheduled for 9/23. Explained she will hear within a week about results.

## 2015-09-09 ENCOUNTER — Ambulatory Visit
Admission: RE | Admit: 2015-09-09 | Discharge: 2015-09-09 | Disposition: A | Discharge status: Home / Self Care | Payer: Commercial Managed Care - HMO | Source: Ambulatory Visit | Attending: Neurology | Admitting: Neurology

## 2015-09-09 ENCOUNTER — Inpatient Hospital Stay: Admission: RE | Admit: 2015-09-09 | Payer: Commercial Managed Care - HMO | Source: Ambulatory Visit

## 2015-09-09 DIAGNOSIS — R413 Other amnesia: Secondary | ICD-10-CM

## 2015-09-09 DIAGNOSIS — R4789 Other speech disturbances: Secondary | ICD-10-CM

## 2015-09-12 ENCOUNTER — Telehealth: Payer: Self-pay | Admitting: Nurse Practitioner

## 2015-09-12 NOTE — Telephone Encounter (Signed)
Called patient back, no answer. Left message thanks. Will try again

## 2015-09-12 NOTE — Telephone Encounter (Signed)
Called and spoke to pt to let her know MRI are not ready yet. Dr. Jaynee Eagles needs to review them. I will let Dr. Jaynee Eagles know she called and we will call her once the results are ready. She verbalized understanding.

## 2015-09-12 NOTE — Telephone Encounter (Signed)
Pt called and would like MRI results . Please call [phone].

## 2015-09-13 NOTE — Telephone Encounter (Signed)
I feel her brain is within normal limits for age. Advised close follow up with pcp for vascular risk factors. Spoke to her at length. See rpeort below:   IMPRESSION: This MRI of the brain without contrast shows the following,: 1. Mild generalized cortical atrophy. This appears fairly stable when compared to the CT scan dated 06/27/2009. 2. Scattered T2/flair hyperintense foci in the white matter of both hemispheres, more within the left frontal lobe. None of these appear to be acute. This appears to have progressed compared to the CT scan dated 06/27/2009, though CT scan is much less sensitive for this type of change. 3. There are no acute findings. 4. The intraventricular blood noted on CT in 2010 has resolved

## 2015-09-13 NOTE — Telephone Encounter (Signed)
Patient returned Dr. Cathren Laine call. She is currently using daughter's phone [phone] (hers went through the washing machine). If you try to leave message, she won't be able to retrieve it.

## 2015-10-13 DIAGNOSIS — J019 Acute sinusitis, unspecified: Secondary | ICD-10-CM | POA: Diagnosis not present

## 2016-01-02 ENCOUNTER — Other Ambulatory Visit: Payer: Self-pay | Admitting: Family Medicine

## 2016-01-02 DIAGNOSIS — R921 Mammographic calcification found on diagnostic imaging of breast: Secondary | ICD-10-CM

## 2016-01-06 ENCOUNTER — Ambulatory Visit
Admission: RE | Admit: 2016-01-06 | Discharge: 2016-01-06 | Disposition: A | Discharge status: Home / Self Care | Payer: Commercial Managed Care - HMO | Source: Ambulatory Visit | Attending: Family Medicine | Admitting: Family Medicine

## 2016-01-06 DIAGNOSIS — R921 Mammographic calcification found on diagnostic imaging of breast: Secondary | ICD-10-CM

## 2016-02-23 ENCOUNTER — Ambulatory Visit: Payer: Commercial Managed Care - HMO | Admitting: Nurse Practitioner

## 2016-06-20 ENCOUNTER — Other Ambulatory Visit: Payer: Self-pay | Admitting: Family Medicine

## 2016-06-20 DIAGNOSIS — R921 Mammographic calcification found on diagnostic imaging of breast: Secondary | ICD-10-CM

## 2016-07-06 ENCOUNTER — Ambulatory Visit
Admission: RE | Admit: 2016-07-06 | Discharge: 2016-07-06 | Disposition: A | Discharge status: Home / Self Care | Payer: Commercial Managed Care - HMO | Source: Ambulatory Visit | Attending: Family Medicine | Admitting: Family Medicine

## 2016-07-06 DIAGNOSIS — R921 Mammographic calcification found on diagnostic imaging of breast: Secondary | ICD-10-CM

## 2016-07-27 DIAGNOSIS — E1129 Type 2 diabetes mellitus with other diabetic kidney complication: Secondary | ICD-10-CM | POA: Diagnosis not present

## 2016-07-27 DIAGNOSIS — E039 Hypothyroidism, unspecified: Secondary | ICD-10-CM | POA: Diagnosis not present

## 2016-07-27 DIAGNOSIS — I10 Essential (primary) hypertension: Secondary | ICD-10-CM | POA: Diagnosis not present

## 2016-07-27 DIAGNOSIS — Z7984 Long term (current) use of oral hypoglycemic drugs: Secondary | ICD-10-CM | POA: Diagnosis not present

## 2016-07-27 DIAGNOSIS — Z1211 Encounter for screening for malignant neoplasm of colon: Secondary | ICD-10-CM | POA: Diagnosis not present

## 2016-07-27 DIAGNOSIS — E785 Hyperlipidemia, unspecified: Secondary | ICD-10-CM | POA: Diagnosis not present

## 2016-07-27 DIAGNOSIS — E1122 Type 2 diabetes mellitus with diabetic chronic kidney disease: Secondary | ICD-10-CM | POA: Diagnosis not present

## 2016-07-27 DIAGNOSIS — Z23 Encounter for immunization: Secondary | ICD-10-CM | POA: Diagnosis not present

## 2016-07-27 DIAGNOSIS — R8290 Unspecified abnormal findings in urine: Secondary | ICD-10-CM | POA: Diagnosis not present

## 2016-08-02 DIAGNOSIS — R109 Unspecified abdominal pain: Secondary | ICD-10-CM | POA: Diagnosis not present

## 2016-08-02 DIAGNOSIS — R748 Abnormal levels of other serum enzymes: Secondary | ICD-10-CM | POA: Diagnosis not present

## 2016-08-10 DIAGNOSIS — E113293 Type 2 diabetes mellitus with mild nonproliferative diabetic retinopathy without macular edema, bilateral: Secondary | ICD-10-CM | POA: Diagnosis not present

## 2017-01-16 DIAGNOSIS — I10 Essential (primary) hypertension: Secondary | ICD-10-CM | POA: Diagnosis not present

## 2017-01-16 DIAGNOSIS — E1129 Type 2 diabetes mellitus with other diabetic kidney complication: Secondary | ICD-10-CM | POA: Diagnosis not present

## 2017-01-16 DIAGNOSIS — E782 Mixed hyperlipidemia: Secondary | ICD-10-CM | POA: Diagnosis not present

## 2017-04-22 ENCOUNTER — Encounter: Payer: Self-pay | Admitting: Gastroenterology

## 2017-06-05 DIAGNOSIS — I10 Essential (primary) hypertension: Secondary | ICD-10-CM | POA: Diagnosis not present

## 2017-06-05 DIAGNOSIS — E039 Hypothyroidism, unspecified: Secondary | ICD-10-CM | POA: Diagnosis not present

## 2017-06-05 DIAGNOSIS — E1165 Type 2 diabetes mellitus with hyperglycemia: Secondary | ICD-10-CM | POA: Diagnosis not present

## 2017-06-05 DIAGNOSIS — Z7984 Long term (current) use of oral hypoglycemic drugs: Secondary | ICD-10-CM | POA: Diagnosis not present

## 2017-07-01 ENCOUNTER — Encounter: Payer: Self-pay | Admitting: Gastroenterology

## 2017-08-23 ENCOUNTER — Ambulatory Visit (AMBULATORY_SURGERY_CENTER): Payer: Medicare HMO | Admitting: *Deleted

## 2017-08-23 VITALS — Ht 62.5 in | Wt 188.0 lb

## 2017-08-23 DIAGNOSIS — Z1211 Encounter for screening for malignant neoplasm of colon: Secondary | ICD-10-CM

## 2017-08-23 MED ORDER — NA SULFATE-K SULFATE-MG SULF 17.5-3.13-1.6 GM/177ML PO SOLN: 1.0000 | Freq: Once | ORAL | 0 refills | Status: AC

## 2017-08-23 NOTE — Progress Notes (Signed)
Denies allergies to eggs or soy products. Denies complications with sedation or anesthesia. Denies O2 use. Denies use of diet or weight loss medications.  Emmi instructions not given for colonoscopy, pt does not have access to computer.

## 2017-09-03 ENCOUNTER — Encounter: Payer: Commercial Managed Care - HMO | Admitting: Gastroenterology

## 2017-09-24 ENCOUNTER — Encounter: Payer: Self-pay | Admitting: Gastroenterology

## 2017-10-01 ENCOUNTER — Telehealth: Payer: Self-pay | Admitting: Gastroenterology

## 2017-10-01 NOTE — Telephone Encounter (Signed)
DPR on file. Information left on her voicemail.

## 2017-10-01 NOTE — Telephone Encounter (Signed)
Propofol is less likely to cause nausea or vomiting than the prior anaesthetic or general anaesthesia. We can give her anti emetic if needed

## 2017-10-08 ENCOUNTER — Encounter: Payer: Medicare HMO | Admitting: Gastroenterology

## 2017-11-04 DIAGNOSIS — E119 Type 2 diabetes mellitus without complications: Secondary | ICD-10-CM | POA: Diagnosis not present

## 2017-11-04 DIAGNOSIS — H25041 Posterior subcapsular polar age-related cataract, right eye: Secondary | ICD-10-CM | POA: Diagnosis not present

## 2017-12-04 DIAGNOSIS — F411 Generalized anxiety disorder: Secondary | ICD-10-CM | POA: Diagnosis not present

## 2017-12-04 DIAGNOSIS — F329 Major depressive disorder, single episode, unspecified: Secondary | ICD-10-CM | POA: Diagnosis not present

## 2017-12-06 DIAGNOSIS — E119 Type 2 diabetes mellitus without complications: Secondary | ICD-10-CM | POA: Diagnosis not present

## 2017-12-06 DIAGNOSIS — H2513 Age-related nuclear cataract, bilateral: Secondary | ICD-10-CM | POA: Diagnosis not present

## 2017-12-06 DIAGNOSIS — H25011 Cortical age-related cataract, right eye: Secondary | ICD-10-CM | POA: Diagnosis not present

## 2017-12-18 DIAGNOSIS — F329 Major depressive disorder, single episode, unspecified: Secondary | ICD-10-CM | POA: Diagnosis not present

## 2017-12-26 DIAGNOSIS — F329 Major depressive disorder, single episode, unspecified: Secondary | ICD-10-CM | POA: Diagnosis not present

## 2018-01-01 DIAGNOSIS — F329 Major depressive disorder, single episode, unspecified: Secondary | ICD-10-CM | POA: Diagnosis not present

## 2018-01-01 DIAGNOSIS — F411 Generalized anxiety disorder: Secondary | ICD-10-CM | POA: Diagnosis not present

## 2018-01-15 DIAGNOSIS — F411 Generalized anxiety disorder: Secondary | ICD-10-CM | POA: Diagnosis not present

## 2018-01-15 DIAGNOSIS — F329 Major depressive disorder, single episode, unspecified: Secondary | ICD-10-CM | POA: Diagnosis not present

## 2018-01-29 DIAGNOSIS — F329 Major depressive disorder, single episode, unspecified: Secondary | ICD-10-CM | POA: Diagnosis not present

## 2018-01-29 DIAGNOSIS — F411 Generalized anxiety disorder: Secondary | ICD-10-CM | POA: Diagnosis not present

## 2018-02-11 DIAGNOSIS — H25011 Cortical age-related cataract, right eye: Secondary | ICD-10-CM | POA: Diagnosis not present

## 2018-02-11 DIAGNOSIS — H2511 Age-related nuclear cataract, right eye: Secondary | ICD-10-CM | POA: Diagnosis not present

## 2018-02-11 DIAGNOSIS — H25811 Combined forms of age-related cataract, right eye: Secondary | ICD-10-CM | POA: Diagnosis not present

## 2018-02-12 DIAGNOSIS — F329 Major depressive disorder, single episode, unspecified: Secondary | ICD-10-CM | POA: Diagnosis not present

## 2018-02-12 DIAGNOSIS — F411 Generalized anxiety disorder: Secondary | ICD-10-CM | POA: Diagnosis not present

## 2018-02-20 DIAGNOSIS — F329 Major depressive disorder, single episode, unspecified: Secondary | ICD-10-CM | POA: Diagnosis not present

## 2018-03-05 DIAGNOSIS — F411 Generalized anxiety disorder: Secondary | ICD-10-CM | POA: Diagnosis not present

## 2018-03-05 DIAGNOSIS — F329 Major depressive disorder, single episode, unspecified: Secondary | ICD-10-CM | POA: Diagnosis not present

## 2018-03-24 ENCOUNTER — Encounter: Payer: Self-pay | Admitting: Gastroenterology

## 2018-03-26 DIAGNOSIS — F329 Major depressive disorder, single episode, unspecified: Secondary | ICD-10-CM | POA: Diagnosis not present

## 2018-04-03 DIAGNOSIS — E039 Hypothyroidism, unspecified: Secondary | ICD-10-CM | POA: Diagnosis not present

## 2018-04-03 DIAGNOSIS — I10 Essential (primary) hypertension: Secondary | ICD-10-CM | POA: Diagnosis not present

## 2018-04-03 DIAGNOSIS — E785 Hyperlipidemia, unspecified: Secondary | ICD-10-CM | POA: Diagnosis not present

## 2018-04-03 DIAGNOSIS — G720 Drug-induced myopathy: Secondary | ICD-10-CM | POA: Diagnosis not present

## 2018-04-03 DIAGNOSIS — E1129 Type 2 diabetes mellitus with other diabetic kidney complication: Secondary | ICD-10-CM | POA: Diagnosis not present

## 2018-04-03 DIAGNOSIS — Z8669 Personal history of other diseases of the nervous system and sense organs: Secondary | ICD-10-CM | POA: Diagnosis not present

## 2018-04-23 DIAGNOSIS — F411 Generalized anxiety disorder: Secondary | ICD-10-CM | POA: Diagnosis not present

## 2018-04-23 DIAGNOSIS — F329 Major depressive disorder, single episode, unspecified: Secondary | ICD-10-CM | POA: Diagnosis not present

## 2018-05-20 ENCOUNTER — Ambulatory Visit (AMBULATORY_SURGERY_CENTER): Payer: Self-pay | Admitting: *Deleted

## 2018-05-20 ENCOUNTER — Other Ambulatory Visit: Payer: Self-pay

## 2018-05-20 VITALS — Ht 62.5 in | Wt 181.6 lb

## 2018-05-20 DIAGNOSIS — Z1211 Encounter for screening for malignant neoplasm of colon: Secondary | ICD-10-CM

## 2018-05-20 MED ORDER — NA SULFATE-K SULFATE-MG SULF 17.5-3.13-1.6 GM/177ML PO SOLN: 1.0000 | Freq: Once | ORAL | 0 refills | Status: AC

## 2018-05-20 NOTE — Progress Notes (Signed)
No egg or soy allergy known to patient  No issues with past sedation with any surgeries  or procedures, no intubation problems  No diet pills per patient No home 02 use per patient  No blood thinners per patient  Pt denies issues with constipation  No A fib or A flutter  EMMI video sent to pt's e mail - pt declined  Pt came into [facility] today with concerns about sedation- Her son stopped breathing post op a surgery and pt has PONV history--  We discussed propofol in depth - her PONV involved gas with general anesthesia - informed her we do not use this type of sedation- I documented sedation issues with her family- questions answered

## 2018-05-21 DIAGNOSIS — F329 Major depressive disorder, single episode, unspecified: Secondary | ICD-10-CM | POA: Diagnosis not present

## 2018-05-29 DIAGNOSIS — F411 Generalized anxiety disorder: Secondary | ICD-10-CM | POA: Diagnosis not present

## 2018-05-29 DIAGNOSIS — F329 Major depressive disorder, single episode, unspecified: Secondary | ICD-10-CM | POA: Diagnosis not present

## 2018-06-03 ENCOUNTER — Other Ambulatory Visit: Payer: Self-pay

## 2018-06-03 ENCOUNTER — Ambulatory Visit (AMBULATORY_SURGERY_CENTER): Payer: Medicare HMO | Admitting: Gastroenterology

## 2018-06-03 ENCOUNTER — Encounter: Payer: Self-pay | Admitting: Gastroenterology

## 2018-06-03 VITALS — BP 118/60 | HR 77 | Temp 98.0°F | Resp 18 | Ht 63.0 in | Wt 200.0 lb

## 2018-06-03 DIAGNOSIS — D122 Benign neoplasm of ascending colon: Secondary | ICD-10-CM

## 2018-06-03 DIAGNOSIS — Z1211 Encounter for screening for malignant neoplasm of colon: Secondary | ICD-10-CM

## 2018-06-03 DIAGNOSIS — G4733 Obstructive sleep apnea (adult) (pediatric): Secondary | ICD-10-CM | POA: Diagnosis not present

## 2018-06-03 DIAGNOSIS — D123 Benign neoplasm of transverse colon: Secondary | ICD-10-CM | POA: Diagnosis not present

## 2018-06-03 DIAGNOSIS — E119 Type 2 diabetes mellitus without complications: Secondary | ICD-10-CM | POA: Diagnosis not present

## 2018-06-03 DIAGNOSIS — I10 Essential (primary) hypertension: Secondary | ICD-10-CM | POA: Diagnosis not present

## 2018-06-03 MED ORDER — SODIUM CHLORIDE 0.9 % IV SOLN: 500.0000 mL | Freq: Once | INTRAVENOUS | Status: DC

## 2018-06-03 NOTE — Progress Notes (Signed)
Report to PACU, RN, vss, BBS= Clear.  

## 2018-06-03 NOTE — Progress Notes (Signed)
Called to room to assist during endoscopic procedure.  Patient ID and intended procedure confirmed with present staff. Received instructions for my participation in the procedure from the performing physician.  

## 2018-06-03 NOTE — Progress Notes (Signed)
Before sedation pt stated her ar, was sore distal to IV.  A big know was felt and I was unable to draw back any blood.  IV was d/c'd and new IV started in L forearm one stick 25g by J Shella Lahman CRNA

## 2018-06-03 NOTE — Op Note (Signed)
[facility] Patient Name: [name] Procedure Date: 06/03/2018 8:56 AM MRN: [id number] Endoscopist: Mauri Pole , MD Age: 67 Referring MD:  Date of Birth: 03-11-1951 Gender: Female Account #: [id number] Procedure:                Colonoscopy Indications:              Screening for colorectal malignant neoplasm Medicines:                Monitored Anesthesia Care Procedure:                Pre-Anesthesia Assessment:                           - Prior to the procedure, a History and Physical                            was performed, and patient medications and                            allergies were reviewed. The patient's tolerance of                            previous anesthesia was also reviewed. The risks                            and benefits of the procedure and the sedation                            options and risks were discussed with the patient.                            All questions were answered, and informed consent                            was obtained. Prior Anticoagulants: The patient has                            taken no previous anticoagulant or antiplatelet                            agents. ASA Grade Assessment: II - A patient with                            mild systemic disease. After reviewing the risks                            and benefits, the patient was deemed in                            satisfactory condition to undergo the procedure.                           After obtaining informed consent, the colonoscope  was passed under direct vision. Throughout the                            procedure, the patient's blood pressure, pulse, and                            oxygen saturations were monitored continuously. The                            Colonoscope was introduced through the anus and                            advanced to the the cecum, identified by                            appendiceal orifice  and ileocecal valve. The                            colonoscopy was performed without difficulty. The                            patient tolerated the procedure well. The quality                            of the bowel preparation was adequate. The                            ileocecal valve, appendiceal orifice, and rectum                            were photographed. Scope In: 9:19:34 AM Scope Out: 9:43:36 AM Scope Withdrawal Time: 0 hours 17 minutes 49 seconds  Total Procedure Duration: 0 hours 24 minutes 2 seconds  Findings:                 The perianal and digital rectal examinations were                            normal.                           Two sessile polyps were found in the transverse                            colon and ascending colon. The polyps were 4 to 9                            mm in size. These polyps were removed with a cold                            snare. Resection and retrieval were complete.                           Scattered small and large-mouthed diverticula were  found in the sigmoid colon, descending colon,                            transverse colon and ascending colon. There was                            evidence of an impacted diverticulum.                           Non-bleeding internal hemorrhoids were found during                            retroflexion. The hemorrhoids were medium-sized. Complications:            No immediate complications. Estimated Blood Loss:     Estimated blood loss was minimal. Impression:               - Two 4 to 9 mm polyps in the transverse colon and                            in the ascending colon, removed with a cold snare.                            Resected and retrieved.                           - Moderate diverticulosis in the sigmoid colon, in                            the descending colon, in the transverse colon and                            in the ascending colon. There was  evidence of an                            impacted diverticulum.                           - Non-bleeding internal hemorrhoids. Recommendation:           - Patient has a contact number available for                            emergencies. The signs and symptoms of potential                            delayed complications were discussed with the                            patient. Return to normal activities tomorrow.                            Written discharge instructions were provided to the  patient.                           - Resume previous diet.                           - Continue present medications.                           - Await pathology results.                           - Repeat colonoscopy in 5-10 years for surveillance                            based on pathology results. Mauri Pole, MD 06/03/2018 9:49:47 AM This report has been signed electronically.

## 2018-06-03 NOTE — Progress Notes (Signed)
Pt's states no medical or surgical changes since previsit or office visit. 

## 2018-06-03 NOTE — Patient Instructions (Signed)
Discharge instructions given. Handouts on polyps,diverticulosis and hemorrhoids. Resume previous medications. YOU HAD AN ENDOSCOPIC PROCEDURE TODAY AT THE [facility]:   Refer to the procedure report that was given to you for any specific questions about what was found during the examination.  If the procedure report does not answer your questions, please call your gastroenterologist to clarify.  If you requested that your care partner not be given the details of your procedure findings, then the procedure report has been included in a sealed envelope for you to review at your convenience later.  YOU SHOULD EXPECT: Some feelings of bloating in the abdomen. Passage of more gas than usual.  Walking can help get rid of the air that was put into your GI tract during the procedure and reduce the bloating. If you had a lower endoscopy (such as a colonoscopy or flexible sigmoidoscopy) you may notice spotting of blood in your stool or on the toilet paper. If you underwent a bowel prep for your procedure, you may not have a normal bowel movement for a few days.  Please Note:  You might notice some irritation and congestion in your nose or some drainage.  This is from the oxygen used during your procedure.  There is no need for concern and it should clear up in a day or so.  SYMPTOMS TO REPORT IMMEDIATELY:   Following lower endoscopy (colonoscopy or flexible sigmoidoscopy):  Excessive amounts of blood in the stool  Significant tenderness or worsening of abdominal pains  Swelling of the abdomen that is new, acute  Fever of 100F or higher   For urgent or emergent issues, a gastroenterologist can be reached at any hour by calling [phone].   DIET:  We do recommend a small meal at first, but then you may proceed to your regular diet.  Drink plenty of fluids but you should avoid alcoholic beverages for 24 hours.  ACTIVITY:  You should plan to take it easy for the rest of today and you  should NOT DRIVE or use heavy machinery until tomorrow (because of the sedation medicines used during the test).    FOLLOW UP: Our staff will call the number listed on your records the next business day following your procedure to check on you and address any questions or concerns that you may have regarding the information given to you following your procedure. If we do not reach you, we will leave a message.  However, if you are feeling well and you are not experiencing any problems, there is no need to return our call.  We will assume that you have returned to your regular daily activities without incident.  If any biopsies were taken you will be contacted by phone or by letter within the next 1-3 weeks.  Please call us at [phone] if you have not heard about the biopsies in 3 weeks.    SIGNATURES/CONFIDENTIALITY: You and/or your care partner have signed paperwork which will be entered into your electronic medical record.  These signatures attest to the fact that that the information above on your After Visit Summary has been reviewed and is understood.  Full responsibility of the confidentiality of this discharge information lies with you and/or your care-partner. 

## 2018-06-04 ENCOUNTER — Telehealth: Payer: Self-pay | Admitting: *Deleted

## 2018-06-04 NOTE — Telephone Encounter (Signed)
  Follow up Call-  Call back number 06/03/2018  Post procedure Call Back phone  # [phone]  Permission to leave phone message Yes  Some recent data might be hidden     Patient questions:  Do you have a fever, pain , or abdominal swelling? No. Pain Score  0 *  Have you tolerated food without any problems? Yes  Have you been able to return to your normal activities? Yes.    Do you have any questions about your discharge instructions: Diet   No. Medications  No. Follow up visit  No.  Do you have questions or concerns about your Care? No.  Actions: * If pain score is 4 or above: No action needed, pain <4.

## 2018-06-07 ENCOUNTER — Encounter: Payer: Self-pay | Admitting: Gastroenterology

## 2018-06-13 DIAGNOSIS — B351 Tinea unguium: Secondary | ICD-10-CM | POA: Diagnosis not present

## 2018-06-13 DIAGNOSIS — L03032 Cellulitis of left toe: Secondary | ICD-10-CM | POA: Diagnosis not present

## 2018-06-23 ENCOUNTER — Ambulatory Visit (INDEPENDENT_AMBULATORY_CARE_PROVIDER_SITE_OTHER): Payer: Medicare HMO | Admitting: Podiatry

## 2018-06-23 ENCOUNTER — Encounter: Payer: Self-pay | Admitting: Podiatry

## 2018-06-23 DIAGNOSIS — M79675 Pain in left toe(s): Secondary | ICD-10-CM

## 2018-06-23 DIAGNOSIS — B351 Tinea unguium: Secondary | ICD-10-CM | POA: Diagnosis not present

## 2018-06-23 DIAGNOSIS — E1165 Type 2 diabetes mellitus with hyperglycemia: Secondary | ICD-10-CM

## 2018-06-23 MED ORDER — CICLOPIROX 8 % EX SOLN: Freq: Every day | CUTANEOUS | 1 refills | Status: DC

## 2018-06-23 NOTE — Patient Instructions (Signed)
Diabetes and Foot Care Diabetes may cause you to have problems because of poor blood supply (circulation) to your feet and legs. This may cause the skin on your feet to become thinner, break easier, and heal more slowly. Your skin may become dry, and the skin may peel and crack. You may also have nerve damage in your legs and feet causing decreased feeling in them. You may not notice minor injuries to your feet that could lead to infections or more serious problems. Taking care of your feet is one of the most important things you can do for yourself. Follow these instructions at home:  Wear shoes at all times, even in the house. Do not go barefoot. Bare feet are easily injured.  Check your feet daily for blisters, cuts, and redness. If you cannot see the bottom of your feet, use a mirror or ask someone for help.  Wash your feet with warm water (do not use hot water) and mild soap. Then pat your feet and the areas between your toes until they are completely dry. Do not soak your feet as this can dry your skin.  Apply a moisturizing lotion or petroleum jelly (that does not contain alcohol and is unscented) to the skin on your feet and to dry, brittle toenails. Do not apply lotion between your toes.  Trim your toenails straight across. Do not dig under them or around the cuticle. File the edges of your nails with an emery board or nail file.  Do not cut corns or calluses or try to remove them with medicine.  Wear clean socks or stockings every day. Make sure they are not too tight. Do not wear knee-high stockings since they may decrease blood flow to your legs.  Wear shoes that fit properly and have enough cushioning. To break in new shoes, wear them for just a few hours a day. This prevents you from injuring your feet. Always look in your shoes before you put them on to be sure there are no objects inside.  Do not cross your legs. This may decrease the blood flow to your feet.  If you find a  minor scrape, cut, or break in the skin on your feet, keep it and the skin around it clean and dry. These areas may be cleansed with mild soap and water. Do not cleanse the area with peroxide, alcohol, or iodine.  When you remove an adhesive bandage, be sure not to damage the skin around it.  If you have a wound, look at it several times a day to make sure it is healing.  Do not use heating pads or hot water bottles. They may burn your skin. If you have lost feeling in your feet or legs, you may not know it is happening until it is too late.  Make sure your health care provider performs a complete foot exam at least annually or more often if you have foot problems. Report any cuts, sores, or bruises to your health care provider immediately. Contact a health care provider if:  You have an injury that is not healing.  You have cuts or breaks in the skin.  You have an ingrown nail.  You notice redness on your legs or feet.  You feel burning or tingling in your legs or feet.  You have pain or cramps in your legs and feet.  Your legs or feet are numb.  Your feet always feel cold. Get help right away if:  There is increasing   redness, swelling, or pain in or around a wound.  There is a red line that goes up your leg.  Pus is coming from a wound.  You develop a fever or as directed by your health care provider.  You notice a bad smell coming from an ulcer or wound. This information is not intended to replace advice given to you by your health care provider. Make sure you discuss any questions you have with your health care provider. Document Released: 11/30/2000 Document Revised: 05/10/2016 Document Reviewed: 05/12/2013 Elsevier Interactive Patient Education  2017 [employer]   Ciclopirox nail solution What is this medicine? CICLOPIROX (sye kloe PEER ox) NAIL SOLUTION is an antifungal medicine. It used to treat fungal infections of the nails. This medicine may be used for  other purposes; ask your health care provider or pharmacist if you have questions. COMMON BRAND NAME(S): CNL8, Penlac What should I tell my health care provider before I take this medicine? They need to know if you have any of these conditions: -diabetes mellitus -history of seizures -HIV infection -immune system problems or organ transplant -large areas of burned or damaged skin -peripheral vascular disease or poor circulation -taking corticosteroid medication (including steroid inhalers, cream, or lotion) -an unusual or allergic reaction to ciclopirox, isopropyl alcohol, other medicines, foods, dyes, or preservatives -pregnant or trying to get pregnant -breast-feeding How should I use this medicine? This medicine is for external use only. Follow the directions that come with this medicine exactly. Wash and dry your hands before use. Avoid contact with the eyes, mouth or nose. If you do get this medicine in your eyes, rinse out with plenty of cool tap water. Contact your doctor or health care professional if eye irritation occurs. Use at regular intervals. Do not use your medicine more often than directed. Finish the full course prescribed by your doctor or health care professional even if you think you are better. Do not stop using except on your doctor's advice. Talk to your pediatrician regarding the use of this medicine in children. While this medicine may be prescribed for children as young as 12 years for selected conditions, precautions do apply. Overdosage: If you think you have taken too much of this medicine contact a poison control center or emergency room at once. NOTE: This medicine is only for you. Do not share this medicine with others. What if I miss a dose? If you miss a dose, use it as soon as you can. If it is almost time for your next dose, use only that dose. Do not use double or extra doses. What may interact with this medicine? Interactions are not expected. Do not use  any other skin products without telling your doctor or health care professional. This list may not describe all possible interactions. Give your health care provider a list of all the medicines, herbs, non-prescription drugs, or dietary supplements you use. Also tell them if you smoke, drink alcohol, or use illegal drugs. Some items may interact with your medicine. What should I watch for while using this medicine? Tell your doctor or health care professional if your symptoms get worse. Four to six months of treatment may be needed for the nail(s) to improve. Some people may not achieve a complete cure or clearing of the nails by this time. Tell your doctor or health care professional if you develop sores or blisters that do not heal properly. If your nail infection returns after stopping using this product, contact your doctor or health  care professional. What side effects may I notice from receiving this medicine? Side effects that you should report to your doctor or health care professional as soon as possible: -allergic reactions like skin rash, itching or hives, swelling of the face, lips, or tongue -severe irritation, redness, burning, blistering, peeling, swelling, oozing Side effects that usually do not require medical attention (report to your doctor or health care professional if they continue or are bothersome): -mild reddening of the skin -nail discoloration -temporary burning or mild stinging at the site of application This list may not describe all possible side effects. Call your doctor for medical advice about side effects. You may report side effects to FDA at 1-800-FDA-1088. Where should I keep my medicine? Keep out of the reach of children. Store at room temperature between 15 and 30 degrees C (59 and 86 degrees F). Do not freeze. Protect from light by storing the bottle in the carton after every use. This medicine is flammable. Keep away from heat and flame. Throw away any unused  medicine after the expiration date. NOTE: This sheet is a summary. It may not cover all possible information. If you have questions about this medicine, talk to your doctor, pharmacist, or health care provider.  2018 Elsevier/Gold Standard (2008-03-08 16:49:20)

## 2018-06-23 NOTE — Progress Notes (Signed)
Subjective:    Patient ID: [name], female    DOB: [birth year], 67 y.o.   MRN: [id number]  HPI 67 year old female presents the office today for concerns of her left big toenail becoming thickened discolored.  She states that about 12 days ago she went to her primary care physician she was given doxycycline for possible infection to the toenail.  She states that originally she had a very faint program of redness around the toenail but denies any red streaks or any drainage or pus.  She states that her daughter talked her about possibly having a fungus on the toenail as well.  She currently denies any redness or drainage coming from the area.  She states that when she cuts the toenail occasionally gets sore but she feels that the nail is getting loose.   Review of Systems  All other systems reviewed and are negative.  Past Medical History:  Diagnosis Date  . Allergy   . Anxiety    "comes and goes"  . Asthma    dianosed years ago-no meds at this time  . 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 Chronic kidney disease 1997   nepthrotic syndrom with minimal change- in remission per pt   . Depression   . Diabetes mellitus   . Dysrhythmia    "skips beats"  . Fibromyalgia 1980's  . GERD (gastroesophageal reflux disease)    occ- will use Tums and Prolosec  prn   . Head injury 2010  . Headache(784.0) 1980's   on meds for migraines-well controlled  . Hyperlipemia   . Hypertension   . Hypothyroidism   . Irritable bowel syndrome 1980's  . Morbid obesity ([place])   . Pneumonia 2009   several times over the past several years  . PONV (postoperative nausea and vomiting)   . Recurrent upper respiratory infection (URI)   . Shortness of breath    exertion  . Sleep apnea    no cpap  . Vaginal polyp    benign per pt    Past Surgical History:  Procedure Laterality Date  . CHOLECYSTECTOMY  1983  . COLONOSCOPY  2008   DB  . [place] OF UTERUS  1972  .  HYSTEROSCOPY W/D&C  06/29/2011   Procedure: DILATATION AND CURETTAGE (D&C) /HYSTEROSCOPY;  Surgeon: Donnamae Jude, MD;  Location: [facility];  Service: Gynecology;  Laterality: N/A;     Current Outpatient Medications:  .  ACCU-CHEK AVIVA PLUS test strip, , Disp: , Rfl:  .  ACCU-CHEK SOFTCLIX LANCETS lancets, , Disp: , Rfl:  .  amitriptyline (ELAVIL) 100 MG tablet, Take 25 mg by mouth at bedtime. , Disp: , Rfl:  .  aspirin 325 MG tablet, Take 325 mg by mouth at bedtime., Disp: , Rfl:  .  atenolol (TENORMIN) 50 MG tablet, Take 50 mg by mouth daily., Disp: , Rfl:  .  ciclopirox (PENLAC) 8 % solution, Apply topically at bedtime. Apply over nail and surrounding skin. Apply daily over previous coat. After seven (7) days, may remove with alcohol and continue cycle., Disp: 6.6 mL, Rfl: 1 .  empagliflozin (JARDIANCE) 10 MG TABS tablet, Take 10 mg by mouth daily., Disp: , Rfl:  .  Ergocalciferol (VITAMIN D2) 2000 UNITS TABS, Take 2,000 Units by mouth daily., Disp: , Rfl:  .  levothyroxine (SYNTHROID, LEVOTHROID) 50 MCG tablet, Take 50 mcg by mouth daily. , Disp: , Rfl:  .  MAGNESIUM PO, Take  0.5 tablets by mouth daily., Disp: , Rfl:  .  spironolactone-hydrochlorothiazide (ALDACTAZIDE) 25-25 MG per tablet, Take 1 tablet by mouth daily. , Disp: , Rfl:   Current Facility-Administered Medications:  .  0.9 %  sodium chloride infusion, 500 mL, Intravenous, Once, Nandigam, Venia Minks, MD  Allergies  Allergen Reactions  . Acarbose   . Atorvastatin   . Cinnamon     Stomach pain  . Crestor [Rosuvastatin Calcium]   . Glimepiride   . Invokana [Canagliflozin]   . Januvia [Sitagliptin]   . Losartan Potassium   . Metformin And Related Nausea And Vomiting and Other (See Comments)    Patient states that she has severe chills, headache and cramping additionally. Says blood sugar is uncontrolled because of this  . Pravastatin   . Sulfamethoxazole Other (See Comments)    REACTION: unspecified  . Sulfa Antibiotics  Rash and Other (See Comments)    Mother has told patient in the past not to take, she cannot recall there reaction        Objective:   Physical Exam  General: AAO x3, NAD  Dermatological: The left hallux toenail is hypertrophic, dystrophic with yellow-brown discoloration.  The nail is loose distally but firmly adhered proximally.  There is no edema, erythema, drainage or pus.  There is no ascending cellulitis and there is no clinical signs of infection noted today.  Vascular: Dorsalis Pedis artery and Posterior Tibial artery pedal pulses are 2/4 bilateral with immedate capillary fill time.  There is no pain with calf compression, swelling, warmth, erythema.   Neruologic: Grossly intact via light touch bilateral. Protective threshold with Semmes Wienstein monofilament intact to all pedal sites bilateral.   Musculoskeletal: No gross boney pedal deformities bilateral. No pain, crepitus, or limitation noted with foot and ankle range of motion bilateral. Muscular strength 5/5 in all groups tested bilateral.     Assessment & Plan:  67 year old female with left hallux onychomycosis, onychodystrophy currently without signs of infection -Treatment options discussed including all alternatives, risks, and complications -Etiology of symptoms were discussed -We discussed nail removal however she states that her blood sugar has been "very high" and she does not check it.  Because of this we will hold off on nail removal given potential healing issues.  I did sharply debride the toenail with any complications or bleeding.  We discussed treatment options for nail fungus I prescribed Penlac.  Discussed application instructions, side effects, success rates. -Monitor for any clinical signs or symptoms of infection and directed to call the office immediately should any occur or go to the ER.  Return in about 3 months (around 09/23/2018).  Trula Slade DPM

## 2018-06-24 ENCOUNTER — Other Ambulatory Visit: Payer: Self-pay | Admitting: Family Medicine

## 2018-06-24 ENCOUNTER — Encounter: Payer: Self-pay | Admitting: Neurology

## 2018-06-24 ENCOUNTER — Ambulatory Visit (INDEPENDENT_AMBULATORY_CARE_PROVIDER_SITE_OTHER): Payer: Medicare HMO | Admitting: Neurology

## 2018-06-24 VITALS — BP 125/76 | HR 69 | Ht 63.0 in | Wt 186.4 lb

## 2018-06-24 DIAGNOSIS — G250 Essential tremor: Secondary | ICD-10-CM | POA: Diagnosis not present

## 2018-06-24 DIAGNOSIS — R921 Mammographic calcification found on diagnostic imaging of breast: Secondary | ICD-10-CM

## 2018-06-24 DIAGNOSIS — F439 Reaction to severe stress, unspecified: Secondary | ICD-10-CM

## 2018-06-24 DIAGNOSIS — R200 Anesthesia of skin: Secondary | ICD-10-CM

## 2018-06-24 DIAGNOSIS — G43009 Migraine without aura, not intractable, without status migrainosus: Secondary | ICD-10-CM | POA: Diagnosis not present

## 2018-06-24 DIAGNOSIS — R202 Paresthesia of skin: Secondary | ICD-10-CM

## 2018-06-24 MED ORDER — NORTRIPTYLINE HCL 10 MG PO CAPS: 10.0000 mg | ORAL_CAPSULE | Freq: Every day | ORAL | 11 refills | Status: DC

## 2018-06-24 NOTE — Progress Notes (Signed)
[facility]    Provider:  Dr Jaynee Eagles Referring Provider: Hulan Fess, MD Primary Care Physician:  Hulan Fess, MD  CC:  Memory problems  Interval history 06/24/2018: Patient was seen 3 years ago, in 2016,  for memory loss she is here for a new referral of migraines. She has had migraines since she was 36, no FHx known. She has been on amitriptyline for 30 years and now feeling side effects. She feels dizzy and feels it is the amitriptyline and also makes her tired during the day. She has tried to get off of it for years but the migraines come back and she can't sleep. Sheis under significant stress. She is not taking the medication every night. She feels fatigued during the day.  She snores, she is fatigued during the day a lot, she wakes up with headaches. She can have visual auras but not always. She can't explain the headaches to me because every time one starts she takes amitriptyline and she does not get one. Has numbness in the right hand. She has her tsh managed and checked regularly.   Medications tried: amitriptyline, atenolol, zofran  HPI:  ARALI SOMERA is a 67 y.o. female here as a referral from Dr. Rex Kras for memory problems. She has a PMHx of DM, essential HTN, HLD, hypothyroidism, migraine, hyperlipidemia and anxiety. She is worried she has Alzheimer's dementia. She knows people who have had it and worries. Memory problems started 6 years ago. She fell from the attic and had a severe head injury and bleeding in the brain and broken ribs, spent a week in the hospital 2010. Worsening since then. She has always been bad with directions, she can never remember directions but that is not new. On occasion she has forgotten the date she had an appointment. She forgets where she puts things. She has to write notes to remind her and then she forgets where she puts the notes. She has lost keys. She will decide what she needs to do and then forgets what she was doing. On  occasion she doesn't remember something from the past but for the most part the past she does remember. She has always taken notes but she feels frustrated. There is some stress in the household. Margorie John has PTSD and has emotional problems and daughter has schizoaffective disorder. She is under an enormous amount of stress, it is "really bad". She worries about her kids and grandkids. She denies sadness.She also has dizziness.   Reviewed notes, labs and imaging from outside physicians, which showed:  Personally reviewed imaging and agree with the following: Small amount of residual hyperdense blood layering within the occipital horn of the right lateral ventricle, nearly completely resolved. Ventricular size is stable without development of hydrocephalus. No new bleeding seen. No reversible brain parenchymal finding.  CT ORBITS and temporal bones  Findings: No fluid in the paranasal sinuses, middle ears or mastoids. No evidence of regional fracture. No soft tissue abnormality of the orbits, orbital apices or globes is identified. Incidental note is made of degenerative disease of the temporomandibular joint on the right. There is no sign of temporal bone fracture. No inner ear abnormality is seen. Ossicles appear normal.  IMPRESSION: Negative examination.   Review of Systems: Patient complains of symptoms per HPI as well as the following symptoms: memory loss, headache, insomnia, sleepiness, snoring, dizziness, tremor. Pertinent negatives per HPI. All others negative.   Social History   Socioeconomic History  . Marital status: Legally Separated  Spouse name: Not on file  . Number of children: 3  . Years of education: 65  . Highest education level: Not on file  Occupational History  . Occupation: Retired   Scientific laboratory technician  . Financial resource strain: Not on file  . Food insecurity:    Worry: Not on file    Inability: Not on file  . Transportation needs:    Medical: Not  on file    Non-medical: Not on file  Tobacco Use  . Smoking status: Never Smoker  . Smokeless tobacco: Never Used  Substance and Sexual Activity  . Alcohol use: No    Comment: quit 1978  . Drug use: No  . Sexual activity: Not Currently  Lifestyle  . Physical activity:    Days per week: Not on file    Minutes per session: Not on file  . Stress: Not on file  Relationships  . Social connections:    Talks on phone: Not on file    Gets together: Not on file    Attends religious service: Not on file    Active member of club or organization: Not on file    Attends meetings of clubs or organizations: Not on file    Relationship status: Not on file  . Intimate partner violence:    Fear of current or ex partner: Not on file    Emotionally abused: Not on file    Physically abused: Not on file    Forced sexual activity: Not on file  Other Topics Concern  . Not on file  Social History Narrative   Lives at home with daughter and granddaughter     Family History  Problem Relation Age of Onset  . Liver cancer Father        mets to liver   . Bladder Cancer Father   . Colon cancer Father        mets to colon   . Anesthesia problems Mother        hard to wake post op   . High blood pressure Unknown   . Diabetes Unknown   . Arthritis Unknown   . Anesthesia problems Son        stopped breathing post op   . Anesthesia problems Other        PONV   . Dementia Neg Hx   . Esophageal cancer Neg Hx   . Rectal cancer Neg Hx   . Stomach cancer Neg Hx   . Colon polyps Neg Hx     Past Medical History:  Diagnosis Date  . Allergy   . Anxiety    "comes and goes"  . Asthma    dianosed years ago-no meds at this time  . 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 Chronic kidney disease 1997   nepthrotic syndrom with minimal change- in remission per pt   . Depression   . Diabetes mellitus   . Dysrhythmia    "skips beats"  . Fibromyalgia 1980's  . GERD (gastroesophageal  reflux disease)    occ- will use Tums and Prolosec  prn   . Head injury 2010  . Headache(784.0) 1980's   on meds for migraines-well controlled  . Hyperlipemia   . Hypertension   . Hypothyroidism   . Irritable bowel syndrome 1980's  . Morbid obesity ([place])   . Pneumonia 2009   several times over the past several years  . PONV (postoperative nausea and vomiting)   .  Recurrent upper respiratory infection (URI)   . Shortness of breath    exertion  . Sleep apnea    no cpap  . Vaginal polyp    benign per pt    Past Surgical History:  Procedure Laterality Date  . CHOLECYSTECTOMY  1983  . COLONOSCOPY  2008   DB  . [place] OF UTERUS  1972  . HYSTEROSCOPY W/D&C  06/29/2011   Procedure: DILATATION AND CURETTAGE (D&C) /HYSTEROSCOPY;  Surgeon: Donnamae Jude, MD;  Location: [facility];  Service: Gynecology;  Laterality: N/A;    Current Outpatient Medications  Medication Sig Dispense Refill  . ACCU-CHEK AVIVA PLUS test strip     . ACCU-CHEK SOFTCLIX LANCETS lancets     . aspirin 325 MG tablet Take 325 mg by mouth at bedtime.    Marland Kitchen atenolol (TENORMIN) 50 MG tablet Take 50 mg by mouth daily.    . ciclopirox (PENLAC) 8 % solution Apply topically at bedtime. Apply over nail and surrounding skin. Apply daily over previous coat. After seven (7) days, may remove with alcohol and continue cycle. 6.6 mL 1  . empagliflozin (JARDIANCE) 25 MG TABS tablet Take 25 mg by mouth daily.    . Ergocalciferol (VITAMIN D2) 2000 UNITS TABS Take 2,000 Units by mouth daily.    Marland Kitchen levothyroxine (SYNTHROID, LEVOTHROID) 50 MCG tablet Take 50 mcg by mouth daily.     Marland Kitchen spironolactone-hydrochlorothiazide (ALDACTAZIDE) 25-25 MG per tablet Take 1 tablet by mouth daily.     . nortriptyline (PAMELOR) 10 MG capsule Take 1 capsule (10 mg total) by mouth at bedtime. 30 capsule 11   Current Facility-Administered Medications  Medication Dose Route Frequency Provider Last Rate Last Dose  . 0.9 %  sodium chloride  infusion  500 mL Intravenous Once Mauri Pole, MD        Allergies as of 06/24/2018 - Review Complete 06/24/2018  Allergen Reaction Noted  . Acarbose  05/20/2018  . Atorvastatin  05/20/2018  . Cinnamon  06/15/2015  . Crestor [rosuvastatin calcium]  05/20/2018  . Glimepiride  05/20/2018  . Invokana [canagliflozin]  05/20/2018  . Januvia [sitagliptin]  05/20/2018  . Losartan potassium  05/20/2018  . Metformin and related Nausea And Vomiting and Other (See Comments) 06/26/2011  . Pravastatin  05/20/2018  . Sulfamethoxazole Other (See Comments) 03/24/2007  . Sulfa antibiotics Rash and Other (See Comments) 06/16/2011    Vitals: BP 125/76   Pulse 69   Ht 5\' 3"  (1.6 m)   Wt 186 lb 6.4 oz (84.6 kg)   BMI 33.02 kg/m  Last Weight:  Wt Readings from Last 1 Encounters:  06/24/18 186 lb 6.4 oz (84.6 kg)   Last Height:   Ht Readings from Last 1 Encounters:  06/24/18 5\' 3"  (1.6 m)    Physical exam: Exam: Gen: NAD, conversant, well nourised, obese, well groomed                     CV: RRR, no MRG. No Carotid Bruits. No peripheral edema, warm, nontender Eyes: Conjunctivae clear without exudates or hemorrhage  Neuro: Detailed Neurologic Exam  Speech:    Speech is normal; fluent and spontaneous with normal comprehension.  Cognition: Patient is oriented to person place and time and situation, fund of knowledge appears appropriate for education level, language is fluent.  Montreal Cognitive Assessment  08/25/2015  Visuospatial/ Executive (0/5) 3  Naming (0/3) 3  Attention: Read list of digits (0/2) 2  Attention: Read list of  letters (0/1) 1  Attention: Serial 7 subtraction starting at 100 (0/3) 1  Language: Repeat phrase (0/2) 1  Language : Fluency (0/1) 1  Abstraction (0/2) 1  Delayed Recall (0/5) 2  Orientation (0/6) 6  Total 21  Adjusted Score (based on education) 22    Cranial Nerves:    The pupils are equal, round, and reactive to light. The fundi are flat.  Visual fields are full to finger confrontation. Extraocular movements are intact. Trigeminal sensation is intact and the muscles of mastication are normal. The face is symmetric. The palate elevates in the midline. Hearing intact. Voice is normal. Shoulder shrug is normal. The tongue has normal motion without fasciculations.   Coordination:    Normal finger to nose and heel to shin.   Gait:    Heel-toe and tandem gait are normal.   Motor Observation:    Tremulous voice, head tremor. No resting tremor.  Tone:    Normal muscle tone.    Posture:    Posture is normal. normal erect    Strength:mild hip flexion weakness. Otherwise Strength is V/V in the upper and lower limbs.      Sensation: intact to LT     Reflex Exam:  DTR's: Absent AJs   Toes:    The toes are downgoing bilaterally.   Clonus:    Clonus is absent.      Assessment/Plan:   67 y.o. female here as a referral for Migraines. She has a PMHx of DM, essential HTN, HLD, hypothyroidism, migraine, hyperlipidemia and anxiety.    Migraines: She has been on amitriptyline for 30 years and feels that she is having side effects including memory changes, dizziness, weight gain.  She is also been seen for memory loss in the past and we did discuss amitriptyline side effects.  Unfortunately this class of medication works extremely well for her migraines.  Discussed that we could switch classes of medications and she is hesitant.  We can decrease her dose and switch her to nortriptyline which has less side effects and see if this works.  Stress: She has significant stress at home and this is been ongoing for years.  Discussed that stress can worsen multiple medical conditions and headaches.  Patient is looking into ways to deal with her daughter who lives with her who is causing lots of her stress.  Sleep apnea?:  Patient denies ever been diagnosed with sleep apnea however this is on her problem list.  Discussed sleep apnea untreated can  cause multiple sequela including stroke, pulmonary hypertension, cardiovascular issues, headache and many others.  She declines at this time seeing her sleep team.  Essential tremor: Patient son also has essential tremor.  Discussed diagnosis with her.  At this time will monitor clinically.  Numbness of the right hand: Recommend EMG nerve conduction study, she declines at this time may be in the future.  Memory changes: recommend f/u for memory changes. She denies any progressionof memory changes, there is continued and worsened stress in life however  CC: Dr. Simmie Davies, MD  [facility] [address] [place], [place] [address]  Phone [phone] Fax [phone]  A total of 45 minutes was spent face-to-face with this patient. Over half this time was spent on counseling patient on the    ICD-10-CM   1. Migraine without aura and without status migrainosus, not intractable G43.009   2. Essential tremor G25.0   3. Stress F43.9   4. Numbness and tingling  in right hand R20.0    R20.2    diagnosis and different diagnostic and therapeutic options, counseling and coordination of care, risks ans benefits of management, compliance, or risk factor reduction and education.

## 2018-06-24 NOTE — Patient Instructions (Addendum)
Stop amitriptyline and start Nortriptyline  Essential Tremor A tremor is trembling or shaking that you cannot control. Most tremors affect the hands or arms. Tremors can also affect the head, vocal cords, face, and other parts of the body. Essential tremor is a tremor without a known cause. What are the causes? Essential tremor has no known cause. What increases the risk? You may be at greater risk of essential tremor if:  You have a family member with essential tremor.  You are age 60 or older.  You take certain medicines.  What are the signs or symptoms? The main sign of a tremor is uncontrolled and unintentional rhythmic shaking of a body part.  You may have difficulty eating with a spoon or fork.  You may have difficulty writing.  You may nod your head up and down or side to side.  You may have a quivering voice.  Your tremors:  May get worse over time.  May come and go.  May be more noticeable on one side of your body.  May get worse due to stress, fatigue, caffeine, and extreme heat or cold.  How is this diagnosed? Your health care provider can diagnose essential tremor based on your symptoms, medical history, and a physical examination. There is no single test to diagnose an essential tremor. However, your health care provider may perform a variety of tests to rule out other conditions. Tests may include:  Blood and urine tests.  Imaging studies of your brain, such as: ? CT scan. ? MRI.  A test that measures involuntary muscle movement (electromyogram).  How is this treated? Your tremors may go away without treatment. Mild tremors may not need treatment if they do not affect your day-to-day life. Severe tremors may need to be treated using one or a combination of the following options:  Medicines. This may include medicine that is injected.  Lifestyle changes.  Physical therapy.  Follow these instructions at home:  Take medicines only as directed by  your health care provider.  Limit alcohol intake to no more than 1 drink per day for nonpregnant women and 2 drinks per day for men. One drink equals 12 oz of beer, 5 oz of wine, or 1 oz of hard liquor.  Do not use any tobacco products, including cigarettes, chewing tobacco, or electronic cigarettes. If you need help quitting, ask your health care provider.  Take medicines only as directed by your health care provider.  Avoid extreme heat or cold.  Limit the amount of caffeine you consumeas directed by your health care provider.  Try to get eight hours of sleep each night.  Find ways to manage your stress, such as meditation or yoga.  Keep all follow-up visits as directed by your health care provider. This is important. This includes any physical therapy visits. Contact a health care provider if:  You experience any changes in the location or intensity of your tremors.  You start having a tremor after starting a new medicine.  You have tremor with other symptoms such as: ? Numbness. ? Tingling. ? Pain. ? Weakness.  Your tremor gets worse.  Your tremor interferes with your daily life. This information is not intended to replace advice given to you by your health care provider. Make sure you discuss any questions you have with your health care provider. Document Released: 12/24/2014 Document Revised: 05/10/2016 Document Reviewed: 05/31/2014 Elsevier Interactive Patient Education  2018 [employer].   Nortriptyline capsules What is this medicine? NORTRIPTYLINE (nor  TRIP ti leen) is used to treat depression. This medicine may be used for other purposes; ask your health care provider or pharmacist if you have questions. COMMON BRAND NAME(S): Aventyl, Pamelor What should I tell my health care provider before I take this medicine? They need to know if you have any of these conditions: -an alcohol problem -bipolar disorder or schizophrenia -difficulty passing urine, prostate  trouble -glaucoma -heart disease or recent heart attack -liver disease -over active thyroid -seizures -thoughts or plans of suicide or a previous suicide attempt or family history of suicide attempt -an unusual or allergic reaction to nortriptyline, other medicines, foods, dyes, or preservatives -pregnant or trying to get pregnant -breast-feeding How should I use this medicine? Take this medicine by mouth with a glass of water. Follow the directions on the prescription label. Take your doses at regular intervals. Do not take it more often than directed. Do not stop taking this medicine suddenly except upon the advice of your doctor. Stopping this medicine too quickly may cause serious side effects or your condition may worsen. A special MedGuide will be given to you by the pharmacist with each prescription and refill. Be sure to read this information carefully each time. Talk to your pediatrician regarding the use of this medicine in children. Special care may be needed. Overdosage: If you think you have taken too much of this medicine contact a poison control center or emergency room at once. NOTE: This medicine is only for you. Do not share this medicine with others. What if I miss a dose? If you miss a dose, take it as soon as you can. If it is almost time for your next dose, take only that dose. Do not take double or extra doses. What may interact with this medicine? Do not take this medicine with any of the following medications: -arsenic trioxide -certain medicines medicines for irregular heart beat -cisapride -halofantrine -linezolid -MAOIs like Carbex, Eldepryl, Marplan, Nardil, and Parnate -methylene blue (injected into a vein) -other medicines for mental depression -phenothiazines like perphenazine, thioridazine and chlorpromazine -pimozide -probucol -procarbazine -sparfloxacin -St. John's Wort -ziprasidone This medicine may also interact with any of the following  medications: -atropine and related drugs like hyoscyamine, scopolamine, tolterodine and others -barbiturate medicines for inducing sleep or treating seizures, such as phenobarbital -cimetidine -medicines for diabetes -medicines for seizures like carbamazepine or phenytoin -reserpine -thyroid medicine This list may not describe all possible interactions. Give your health care provider a list of all the medicines, herbs, non-prescription drugs, or dietary supplements you use. Also tell them if you smoke, drink alcohol, or use illegal drugs. Some items may interact with your medicine. What should I watch for while using this medicine? Tell your doctor if your symptoms do not get better or if they get worse. Visit your doctor or health care professional for regular checks on your progress. Because it may take several weeks to see the full effects of this medicine, it is important to continue your treatment as prescribed by your doctor. Patients and their families should watch out for new or worsening thoughts of suicide or depression. Also watch out for sudden changes in feelings such as feeling anxious, agitated, panicky, irritable, hostile, aggressive, impulsive, severely restless, overly excited and hyperactive, or not being able to sleep. If this happens, especially at the beginning of treatment or after a change in dose, call your health care professional. Dennis Bast may get drowsy or dizzy. Do not drive, use machinery, or do anything that needs  mental alertness until you know how this medicine affects you. Do not stand or sit up quickly, especially if you are an older patient. This reduces the risk of dizzy or fainting spells. Alcohol may interfere with the effect of this medicine. Avoid alcoholic drinks. Do not treat yourself for coughs, colds, or allergies without asking your doctor or health care professional for advice. Some ingredients can increase possible side effects. Your mouth may get dry. Chewing  sugarless gum or sucking hard candy, and drinking plenty of water may help. Contact your doctor if the problem does not go away or is severe. This medicine may cause dry eyes and blurred vision. If you wear contact lenses you may feel some discomfort. Lubricating drops may help. See your eye doctor if the problem does not go away or is severe. This medicine can cause constipation. Try to have a bowel movement at least every 2 to 3 days. If you do not have a bowel movement for 3 days, call your doctor or health care professional. This medicine can make you more sensitive to the sun. Keep out of the sun. If you cannot avoid being in the sun, wear protective clothing and use sunscreen. Do not use sun lamps or tanning beds/booths. What side effects may I notice from receiving this medicine? Side effects that you should report to your doctor or health care professional as soon as possible: -allergic reactions like skin rash, itching or hives, swelling of the face, lips, or tongue -anxious -breathing problems -changes in vision -confusion -elevated mood, decreased need for sleep, racing thoughts, impulsive behavior -eye pain -fast, irregular heartbeat -feeling faint or lightheaded, falls -feeling agitated, angry, or irritable -fever with increased sweating -hallucination, loss of contact with reality -seizures -stiff muscles -suicidal thoughts or other mood changes -tingling, pain, or numbness in the feet or hands -trouble passing urine or change in the amount of urine -trouble sleeping -unusually weak or tired -vomiting -yellowing of the eyes or skin Side effects that usually do not require medical attention (report to your doctor or health care professional if they continue or are bothersome): -change in sex drive or performance -change in appetite or weight -constipation -dizziness -dry mouth -nausea -tired -tremors -upset stomach This list may not describe all possible side  effects. Call your doctor for medical advice about side effects. You may report side effects to FDA at 1-800-FDA-1088. Where should I keep my medicine? Keep out of the reach of children. Store at room temperature between 15 and 30 degrees C (59 and 86 degrees F). Keep container tightly closed. Throw away any unused medicine after the expiration date. NOTE: This sheet is a summary. It may not cover all possible information. If you have questions about this medicine, talk to your doctor, pharmacist, or health care provider.  2018 Elsevier/Gold Standard (2016-05-04 12:53:08)

## 2018-06-25 ENCOUNTER — Telehealth: Payer: Self-pay | Admitting: Podiatry

## 2018-06-25 MED ORDER — CICLOPIROX 8 % EX SOLN: Freq: Every day | CUTANEOUS | 1 refills | Status: DC

## 2018-06-25 NOTE — Telephone Encounter (Signed)
Pt stated that she didn't receive prescription for Pinlac from Dr. Jacqualyn Posey at her pharmacy on 06/23/2018.

## 2018-06-25 NOTE — Addendum Note (Signed)
Addended by: Harriett Sine D on: 06/25/2018 12:01 PM   Modules accepted: Orders

## 2018-06-25 NOTE — Telephone Encounter (Signed)
I informed pt the Penlac had been faxed to her [place].

## 2018-07-01 DIAGNOSIS — F411 Generalized anxiety disorder: Secondary | ICD-10-CM | POA: Diagnosis not present

## 2018-07-01 DIAGNOSIS — F329 Major depressive disorder, single episode, unspecified: Secondary | ICD-10-CM | POA: Diagnosis not present

## 2018-07-02 ENCOUNTER — Telehealth: Payer: Self-pay | Admitting: Neurology

## 2018-07-02 MED ORDER — AMITRIPTYLINE HCL 10 MG PO TABS: 10.0000 mg | ORAL_TABLET | Freq: Every day | ORAL | 11 refills | Status: DC

## 2018-07-02 NOTE — Telephone Encounter (Signed)
Pt requesting a call to discuss nortriptyline (PAMELOR) 10 MG capsule. Stating that she had an migraine for a day and a half  due to medication. Pt states she did take amitriptyline to get rid of her migraine. Pt would like a call to discuss if she should medication more time or if she should switch back to amitriptyline.

## 2018-07-02 NOTE — Telephone Encounter (Signed)
Called pt and informed her of Dr. Cathren Laine recommendations. She stated she had thought about increasing it but her head hurt bad and she couldn't. She said her stomach was upset (unsure if this was r/t her headache) and she was having weird dreams. She took a dose of Amitriptyline and said her headache went away within an hour. Dr. Jaynee Eagles agrees to put pt back on Amitriptyline but to lower the dose to 10 mg at night. Pt verbalized agreement and appreciation. She will stop Nortriptyline and a new order will be sent to pharmacy for Amitriptyline 10 mg.   Spoke with [employer] @ [facility]. Canceled Nortriptyline and informed him that Amitriptyline was ordered. He confirmed and said they would fill it for the pt.

## 2018-07-02 NOTE — Telephone Encounter (Signed)
I recommend increasing the Nortriptyline to 20mg  at bedtime. If she agrees please let me know to order. thanks

## 2018-07-04 ENCOUNTER — Ambulatory Visit
Admission: RE | Admit: 2018-07-04 | Discharge: 2018-07-04 | Disposition: A | Discharge status: Home / Self Care | Payer: Medicare HMO | Source: Ambulatory Visit | Attending: Family Medicine | Admitting: Family Medicine

## 2018-07-04 DIAGNOSIS — R921 Mammographic calcification found on diagnostic imaging of breast: Secondary | ICD-10-CM | POA: Diagnosis not present

## 2018-07-23 DIAGNOSIS — F329 Major depressive disorder, single episode, unspecified: Secondary | ICD-10-CM | POA: Diagnosis not present

## 2018-07-23 DIAGNOSIS — F411 Generalized anxiety disorder: Secondary | ICD-10-CM | POA: Diagnosis not present

## 2018-08-11 DIAGNOSIS — F411 Generalized anxiety disorder: Secondary | ICD-10-CM | POA: Diagnosis not present

## 2018-08-11 DIAGNOSIS — F329 Major depressive disorder, single episode, unspecified: Secondary | ICD-10-CM | POA: Diagnosis not present

## 2018-09-09 DIAGNOSIS — F411 Generalized anxiety disorder: Secondary | ICD-10-CM | POA: Diagnosis not present

## 2018-09-09 DIAGNOSIS — F329 Major depressive disorder, single episode, unspecified: Secondary | ICD-10-CM | POA: Diagnosis not present

## 2018-09-23 ENCOUNTER — Ambulatory Visit: Payer: Medicare HMO | Admitting: Podiatry

## 2018-10-08 DIAGNOSIS — H1013 Acute atopic conjunctivitis, bilateral: Secondary | ICD-10-CM | POA: Diagnosis not present

## 2018-10-13 ENCOUNTER — Ambulatory Visit: Payer: Medicare HMO | Admitting: Nurse Practitioner

## 2018-10-20 ENCOUNTER — Ambulatory Visit: Payer: Medicare HMO | Admitting: Podiatry

## 2018-10-24 ENCOUNTER — Ambulatory Visit: Payer: Medicare HMO | Admitting: Podiatry

## 2018-10-29 DIAGNOSIS — F411 Generalized anxiety disorder: Secondary | ICD-10-CM | POA: Diagnosis not present

## 2018-10-29 DIAGNOSIS — F329 Major depressive disorder, single episode, unspecified: Secondary | ICD-10-CM | POA: Diagnosis not present

## 2018-11-25 DIAGNOSIS — F411 Generalized anxiety disorder: Secondary | ICD-10-CM | POA: Diagnosis not present

## 2018-11-25 DIAGNOSIS — F329 Major depressive disorder, single episode, unspecified: Secondary | ICD-10-CM | POA: Diagnosis not present

## 2018-12-25 ENCOUNTER — Ambulatory Visit: Payer: Medicare HMO | Admitting: Podiatry

## 2018-12-26 DIAGNOSIS — F329 Major depressive disorder, single episode, unspecified: Secondary | ICD-10-CM | POA: Diagnosis not present

## 2018-12-26 DIAGNOSIS — F411 Generalized anxiety disorder: Secondary | ICD-10-CM | POA: Diagnosis not present

## 2018-12-31 DIAGNOSIS — Z8669 Personal history of other diseases of the nervous system and sense organs: Secondary | ICD-10-CM | POA: Diagnosis not present

## 2018-12-31 DIAGNOSIS — E1129 Type 2 diabetes mellitus with other diabetic kidney complication: Secondary | ICD-10-CM | POA: Diagnosis not present

## 2018-12-31 DIAGNOSIS — M5416 Radiculopathy, lumbar region: Secondary | ICD-10-CM | POA: Diagnosis not present

## 2018-12-31 DIAGNOSIS — E1122 Type 2 diabetes mellitus with diabetic chronic kidney disease: Secondary | ICD-10-CM | POA: Diagnosis not present

## 2018-12-31 DIAGNOSIS — Z86010 Personal history of colonic polyps: Secondary | ICD-10-CM | POA: Diagnosis not present

## 2018-12-31 DIAGNOSIS — E785 Hyperlipidemia, unspecified: Secondary | ICD-10-CM | POA: Diagnosis not present

## 2018-12-31 DIAGNOSIS — K589 Irritable bowel syndrome without diarrhea: Secondary | ICD-10-CM | POA: Diagnosis not present

## 2018-12-31 DIAGNOSIS — E039 Hypothyroidism, unspecified: Secondary | ICD-10-CM | POA: Diagnosis not present

## 2018-12-31 DIAGNOSIS — Z23 Encounter for immunization: Secondary | ICD-10-CM | POA: Diagnosis not present

## 2018-12-31 DIAGNOSIS — I10 Essential (primary) hypertension: Secondary | ICD-10-CM | POA: Diagnosis not present

## 2019-01-09 DIAGNOSIS — M5416 Radiculopathy, lumbar region: Secondary | ICD-10-CM | POA: Diagnosis not present

## 2019-01-09 DIAGNOSIS — S32000D Wedge compression fracture of unspecified lumbar vertebra, subsequent encounter for fracture with routine healing: Secondary | ICD-10-CM | POA: Diagnosis not present

## 2019-01-09 DIAGNOSIS — S32000A Wedge compression fracture of unspecified lumbar vertebra, initial encounter for closed fracture: Secondary | ICD-10-CM | POA: Diagnosis not present

## 2019-01-14 ENCOUNTER — Other Ambulatory Visit: Payer: Self-pay | Admitting: Family Medicine

## 2019-01-14 DIAGNOSIS — S32000A Wedge compression fracture of unspecified lumbar vertebra, initial encounter for closed fracture: Secondary | ICD-10-CM

## 2019-01-21 ENCOUNTER — Ambulatory Visit: Payer: Medicare HMO | Admitting: Nurse Practitioner

## 2019-01-22 DIAGNOSIS — F411 Generalized anxiety disorder: Secondary | ICD-10-CM | POA: Diagnosis not present

## 2019-01-22 DIAGNOSIS — F329 Major depressive disorder, single episode, unspecified: Secondary | ICD-10-CM | POA: Diagnosis not present

## 2019-01-24 ENCOUNTER — Other Ambulatory Visit: Payer: Self-pay | Admitting: Family Medicine

## 2019-01-24 DIAGNOSIS — M5416 Radiculopathy, lumbar region: Secondary | ICD-10-CM

## 2019-02-04 ENCOUNTER — Ambulatory Visit
Admission: RE | Admit: 2019-02-04 | Discharge: 2019-02-04 | Disposition: A | Discharge status: Home / Self Care | Payer: Medicare HMO | Source: Ambulatory Visit | Attending: Family Medicine | Admitting: Family Medicine

## 2019-02-04 DIAGNOSIS — M5416 Radiculopathy, lumbar region: Secondary | ICD-10-CM

## 2019-02-04 DIAGNOSIS — M48061 Spinal stenosis, lumbar region without neurogenic claudication: Secondary | ICD-10-CM | POA: Diagnosis not present

## 2019-02-05 ENCOUNTER — Other Ambulatory Visit: Payer: Medicare HMO

## 2019-02-11 DIAGNOSIS — F332 Major depressive disorder, recurrent severe without psychotic features: Secondary | ICD-10-CM | POA: Diagnosis not present

## 2019-02-20 ENCOUNTER — Other Ambulatory Visit: Payer: Medicare HMO

## 2019-03-13 ENCOUNTER — Other Ambulatory Visit: Payer: Medicare HMO

## 2019-04-20 ENCOUNTER — Other Ambulatory Visit: Payer: Medicare HMO

## 2019-05-25 ENCOUNTER — Other Ambulatory Visit: Payer: Self-pay | Admitting: Family Medicine

## 2019-05-25 DIAGNOSIS — Z1231 Encounter for screening mammogram for malignant neoplasm of breast: Secondary | ICD-10-CM

## 2019-07-10 ENCOUNTER — Ambulatory Visit
Admission: RE | Admit: 2019-07-10 | Discharge: 2019-07-10 | Disposition: A | Discharge status: Home / Self Care | Payer: Medicare HMO | Source: Ambulatory Visit | Attending: Family Medicine | Admitting: Family Medicine

## 2019-07-10 ENCOUNTER — Other Ambulatory Visit: Payer: Self-pay

## 2019-07-10 DIAGNOSIS — Z780 Asymptomatic menopausal state: Secondary | ICD-10-CM | POA: Diagnosis not present

## 2019-07-10 DIAGNOSIS — M85851 Other specified disorders of bone density and structure, right thigh: Secondary | ICD-10-CM | POA: Diagnosis not present

## 2019-07-10 DIAGNOSIS — Z1231 Encounter for screening mammogram for malignant neoplasm of breast: Secondary | ICD-10-CM | POA: Diagnosis not present

## 2019-07-10 DIAGNOSIS — S32000A Wedge compression fracture of unspecified lumbar vertebra, initial encounter for closed fracture: Secondary | ICD-10-CM

## 2019-07-30 DIAGNOSIS — I10 Essential (primary) hypertension: Secondary | ICD-10-CM | POA: Diagnosis not present

## 2019-07-30 DIAGNOSIS — E1122 Type 2 diabetes mellitus with diabetic chronic kidney disease: Secondary | ICD-10-CM | POA: Diagnosis not present

## 2019-07-30 DIAGNOSIS — Z8669 Personal history of other diseases of the nervous system and sense organs: Secondary | ICD-10-CM | POA: Diagnosis not present

## 2019-07-30 DIAGNOSIS — Z86010 Personal history of colonic polyps: Secondary | ICD-10-CM | POA: Diagnosis not present

## 2019-07-30 DIAGNOSIS — E039 Hypothyroidism, unspecified: Secondary | ICD-10-CM | POA: Diagnosis not present

## 2019-07-30 DIAGNOSIS — E782 Mixed hyperlipidemia: Secondary | ICD-10-CM | POA: Diagnosis not present

## 2019-07-30 DIAGNOSIS — E1169 Type 2 diabetes mellitus with other specified complication: Secondary | ICD-10-CM | POA: Diagnosis not present

## 2019-07-30 DIAGNOSIS — G720 Drug-induced myopathy: Secondary | ICD-10-CM | POA: Diagnosis not present

## 2019-08-05 ENCOUNTER — Ambulatory Visit (INDEPENDENT_AMBULATORY_CARE_PROVIDER_SITE_OTHER): Payer: Medicare HMO | Admitting: Podiatry

## 2019-08-05 ENCOUNTER — Other Ambulatory Visit: Payer: Self-pay

## 2019-08-05 ENCOUNTER — Encounter: Payer: Self-pay | Admitting: Podiatry

## 2019-08-05 VITALS — Temp 98.0°F

## 2019-08-05 DIAGNOSIS — M79675 Pain in left toe(s): Secondary | ICD-10-CM | POA: Diagnosis not present

## 2019-08-05 DIAGNOSIS — B351 Tinea unguium: Secondary | ICD-10-CM

## 2019-08-05 DIAGNOSIS — E1165 Type 2 diabetes mellitus with hyperglycemia: Secondary | ICD-10-CM | POA: Diagnosis not present

## 2019-08-05 DIAGNOSIS — M79674 Pain in right toe(s): Secondary | ICD-10-CM | POA: Diagnosis not present

## 2019-08-05 MED ORDER — NONFORMULARY OR COMPOUNDED ITEM: 3 refills | Status: DC

## 2019-08-05 NOTE — Patient Instructions (Signed)

## 2019-08-06 DIAGNOSIS — G720 Drug-induced myopathy: Secondary | ICD-10-CM | POA: Diagnosis not present

## 2019-08-06 DIAGNOSIS — E782 Mixed hyperlipidemia: Secondary | ICD-10-CM | POA: Diagnosis not present

## 2019-08-06 DIAGNOSIS — E1169 Type 2 diabetes mellitus with other specified complication: Secondary | ICD-10-CM | POA: Diagnosis not present

## 2019-08-06 DIAGNOSIS — Z86010 Personal history of colonic polyps: Secondary | ICD-10-CM | POA: Diagnosis not present

## 2019-08-06 DIAGNOSIS — I10 Essential (primary) hypertension: Secondary | ICD-10-CM | POA: Diagnosis not present

## 2019-08-06 DIAGNOSIS — E1122 Type 2 diabetes mellitus with diabetic chronic kidney disease: Secondary | ICD-10-CM | POA: Diagnosis not present

## 2019-08-06 DIAGNOSIS — Z8669 Personal history of other diseases of the nervous system and sense organs: Secondary | ICD-10-CM | POA: Diagnosis not present

## 2019-08-06 DIAGNOSIS — E039 Hypothyroidism, unspecified: Secondary | ICD-10-CM | POA: Diagnosis not present

## 2019-08-07 DIAGNOSIS — E1165 Type 2 diabetes mellitus with hyperglycemia: Secondary | ICD-10-CM | POA: Diagnosis not present

## 2019-08-17 NOTE — Progress Notes (Signed)
Subjective: [name] presents today with painful, thick toenails 1-5 b/l that she cannot cut and which interfere with daily activities.  Pain is aggravated when wearing enclosed shoe gear.  Patient states that she was using Penlac nail lacquer and relates she would like to try another treatment.   Allergies  Allergen Reactions  . Acarbose   . Atorvastatin   . Cinnamon     Stomach pain  . Crestor [Rosuvastatin Calcium]   . Glimepiride   . Invokana [Canagliflozin]   . Januvia [Sitagliptin]   . Losartan Potassium   . Metformin And Related Nausea And Vomiting and Other (See Comments)    Patient states that she has severe chills, headache and cramping additionally. Says blood sugar is uncontrolled because of this  . Pravastatin   . Sulfamethoxazole Other (See Comments)    REACTION: unspecified  . Sulfa Antibiotics Rash and Other (See Comments)    Mother has told patient in the past not to take, she cannot recall there reaction    Objective: Vitals:   08/05/19 1619  Temp: 98 F (36.7 C)    Vascular Examination: Capillary refill time immediate x 10 digits.  Dorsalis pedis and Posterior tibial pulses palpable b/l.  Digital hair absent x10 digits.  Skin temperature gradient WNL b/l.  Dermatological Examination: Patient's skin is noted to be thin, shiny, and atrophic bilaterally.  Toenails bilateral great toes and right fifth digit are noted to be discolored, thick, dystrophic with subungual debris and pain with palpation to nailbeds due to thickness of nails.  Musculoskeletal: Muscle strength 5/5 to all LE muscle groups  No gross bony deformities b/l.  No pain, crepitus or joint limitation noted with ROM.   Neurological: Sensation intact with 10 gram monofilament.  Vibratory sensation intact.  Assessment: Painful onychomycosis toenailsbilateral great toes and right fifth digit  Diabetes  Plan: 1. Continue diabetic foot care principles.  Literature  dispensed on today.   2. Toenails 1-5 b/l were debrided in length and girth without iatrogenic bleeding.  We discussed topical treatment from [place].  She is interested in using a compounded topical medication. Rx written for nonformulary compounding topical antifungal: [facility]: Antifungal cream - Terbinafine 3%, Fluconazole 2%, Tea Tree Oil 5%, Urea 10%, Ibuprofen 2% in DMSO Suspension #20ml. Apply to the affected nail(s) at bedtime. 3. Patient to continue soft, supportive shoe gear daily. 4. Patient to report any pedal injuries to medical professional immediately. 5. Follow up 3 months.  6. Patient/POA to call should there be a concern in the interim.

## 2019-08-21 DIAGNOSIS — I10 Essential (primary) hypertension: Secondary | ICD-10-CM | POA: Diagnosis not present

## 2019-08-21 DIAGNOSIS — E1121 Type 2 diabetes mellitus with diabetic nephropathy: Secondary | ICD-10-CM | POA: Diagnosis not present

## 2019-08-21 DIAGNOSIS — E1165 Type 2 diabetes mellitus with hyperglycemia: Secondary | ICD-10-CM | POA: Diagnosis not present

## 2019-08-21 DIAGNOSIS — E1140 Type 2 diabetes mellitus with diabetic neuropathy, unspecified: Secondary | ICD-10-CM | POA: Diagnosis not present

## 2019-08-21 DIAGNOSIS — E785 Hyperlipidemia, unspecified: Secondary | ICD-10-CM | POA: Diagnosis not present

## 2019-08-24 ENCOUNTER — Emergency Department (HOSPITAL_COMMUNITY)
Admission: EM | Admit: 2019-08-24 | Discharge: 2019-08-24 | Disposition: A | Discharge status: Home / Self Care | Payer: Medicare HMO | Attending: Emergency Medicine | Admitting: Emergency Medicine

## 2019-08-24 ENCOUNTER — Encounter (HOSPITAL_COMMUNITY): Payer: Self-pay | Admitting: Emergency Medicine

## 2019-08-24 ENCOUNTER — Other Ambulatory Visit: Payer: Self-pay

## 2019-08-24 ENCOUNTER — Emergency Department (HOSPITAL_COMMUNITY): Payer: Medicare HMO

## 2019-08-24 DIAGNOSIS — E039 Hypothyroidism, unspecified: Secondary | ICD-10-CM | POA: Diagnosis not present

## 2019-08-24 DIAGNOSIS — Z79899 Other long term (current) drug therapy: Secondary | ICD-10-CM | POA: Insufficient documentation

## 2019-08-24 DIAGNOSIS — S0990XA Unspecified injury of head, initial encounter: Secondary | ICD-10-CM | POA: Diagnosis not present

## 2019-08-24 DIAGNOSIS — I10 Essential (primary) hypertension: Secondary | ICD-10-CM | POA: Insufficient documentation

## 2019-08-24 DIAGNOSIS — Y999 Unspecified external cause status: Secondary | ICD-10-CM | POA: Insufficient documentation

## 2019-08-24 DIAGNOSIS — Y929 Unspecified place or not applicable: Secondary | ICD-10-CM | POA: Diagnosis not present

## 2019-08-24 DIAGNOSIS — Z7984 Long term (current) use of oral hypoglycemic drugs: Secondary | ICD-10-CM | POA: Diagnosis not present

## 2019-08-24 DIAGNOSIS — R42 Dizziness and giddiness: Secondary | ICD-10-CM | POA: Insufficient documentation

## 2019-08-24 DIAGNOSIS — J45909 Unspecified asthma, uncomplicated: Secondary | ICD-10-CM | POA: Insufficient documentation

## 2019-08-24 DIAGNOSIS — S0003XA Contusion of scalp, initial encounter: Secondary | ICD-10-CM | POA: Insufficient documentation

## 2019-08-24 DIAGNOSIS — W11XXXA Fall on and from ladder, initial encounter: Secondary | ICD-10-CM | POA: Insufficient documentation

## 2019-08-24 DIAGNOSIS — E119 Type 2 diabetes mellitus without complications: Secondary | ICD-10-CM | POA: Diagnosis not present

## 2019-08-24 DIAGNOSIS — W19XXXA Unspecified fall, initial encounter: Secondary | ICD-10-CM

## 2019-08-24 DIAGNOSIS — Y93E5 Activity, floor mopping and cleaning: Secondary | ICD-10-CM | POA: Diagnosis not present

## 2019-08-24 DIAGNOSIS — S199XXA Unspecified injury of neck, initial encounter: Secondary | ICD-10-CM | POA: Diagnosis not present

## 2019-08-24 DIAGNOSIS — R51 Headache: Secondary | ICD-10-CM | POA: Diagnosis not present

## 2019-08-24 DIAGNOSIS — M542 Cervicalgia: Secondary | ICD-10-CM | POA: Diagnosis not present

## 2019-08-24 LAB — CBG MONITORING, ED: Glucose-Capillary: 179 mg/dL — ABNORMAL HIGH (ref 70–99)

## 2019-08-24 NOTE — Discharge Instructions (Addendum)
You were evaluated in the emergency department for injuries from a fall.  You had a CAT scan of your head and neck that did not show any acute findings.  They did comment upon some abnormal calcifications and this will need to be followed up with your primary care doctor and a neurologist.  We are sending you home with a list of head injury instructions for what to return to the emergency department for.  Please return if any concerns.  IMPRESSION:  1.  No acute intracranial abnormality.     2. Dense calcification in the bilateral basal basal ganglia as well  as in the cerebellum. There has been progression of calcification in  the cerebellum since 2010. 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

## 2019-08-24 NOTE — ED Triage Notes (Signed)
Pt here after fall from 4 feet while on a ladder to the ground where she hit the back of her head 1.5 hours ago, large hematoma present. Pt endorses feeling "slightly dizzy" prior to fall, denies LOC. Pt denies blood thinners, only aspirin. Pt sts she takes amitriptyline which has made her dizzy in the past, but as she has decreased the dosage the dizziness has subsided. Endorses progressively worsening headache. Pt ambulatory.

## 2019-08-24 NOTE — ED Provider Notes (Signed)
[facility] EMERGENCY DEPARTMENT Provider Note   CSN: [id number] Arrival date & time: 08/24/19  1405     History   Chief Complaint Chief Complaint  Patient presents with  . Fall    HPI Veleria Rashell Wold is a 68 y.o. female.  She is here for evaluation of injuries from a fall.  She said she was up on a ladder about 4 feet clean the windows when she experienced dizziness and lost her balance falling backwards hitting the back of her head.  No LOC.  Takes an aspirin a day.  No double vision blurry vision no vomiting no balance issues now.  She said she has had dizziness for years since she relates it to her amitriptyline that she has been trying to wean down.     The history is provided by the patient.  Fall This is a new problem. The current episode started 3 to 5 hours ago. The problem has been gradually improving. Associated symptoms include headaches. Pertinent negatives include no chest pain, no abdominal pain and no shortness of breath. Nothing aggravates the symptoms. Nothing relieves the symptoms. She has tried nothing for the symptoms. The treatment provided no relief.    Past Medical History:  Diagnosis Date  . Allergy   . Anxiety    "comes and goes"  . Asthma    dianosed years ago-no meds at this time  . 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 Chronic kidney disease 1997   nepthrotic syndrom with minimal change- in remission per pt   . Depression   . Diabetes mellitus   . Dysrhythmia    "skips beats"  . Fibromyalgia 1980's  . GERD (gastroesophageal reflux disease)    occ- will use Tums and Prolosec  prn   . Head injury 2010  . Headache(784.0) 1980's   on meds for migraines-well controlled  . Hyperlipemia   . Hypertension   . Hypothyroidism   . Irritable bowel syndrome 1980's  . Morbid obesity ([place])   . Pneumonia 2009   several times over the past several years  . PONV (postoperative nausea and vomiting)   . Recurrent  upper respiratory infection (URI)   . Shortness of breath    exertion  . Sleep apnea    no cpap  . Vaginal polyp    benign per pt    Patient Active Problem List   Diagnosis Date Noted  . Essential tremor 06/24/2018  . Migraine without aura 06/24/2018  . Memory changes 08/28/2015  . Stress at home 08/28/2015  . Post-menopausal bleeding 06/16/2011  . Kidney disease 06/16/2011  . Colon polyp 06/16/2011  . Vaginal polyp 06/16/2011  . Chronic diarrhea 06/16/2011  . Diverticulitis 06/16/2011  . DUB (dysfunctional uterine bleeding) 06/16/2011  . Fatty liver 06/16/2011  . Diabetes mellitus 06/16/2011    Past Surgical History:  Procedure Laterality Date  . CHOLECYSTECTOMY  1983  . COLONOSCOPY  2008   DB  . [place] OF UTERUS  1972  . HYSTEROSCOPY W/D&C  06/29/2011   Procedure: DILATATION AND CURETTAGE (D&C) /HYSTEROSCOPY;  Surgeon: Donnamae Jude, MD;  Location: [facility];  Service: Gynecology;  Laterality: N/A;     OB History    Gravida  5   Para  3   Term  3   Preterm  0   AB  2   Living  3     SAB  2   TAB  Ectopic      Multiple      Live Births               Home Medications    Prior to Admission medications   Medication Sig Start Date End Date Taking? Authorizing Provider  ACCU-CHEK AVIVA PLUS test strip  08/16/15   [provider]  ACCU-CHEK SOFTCLIX LANCETS lancets  08/15/15   [provider]  amitriptyline (ELAVIL) 10 MG tablet Take 1 tablet (10 mg total) by mouth at bedtime. 07/02/18   Melvenia Beam, MD  aspirin 325 MG tablet Take 325 mg by mouth at bedtime.    [provider]  atenolol (TENORMIN) 50 MG tablet Take 50 mg by mouth daily.    [provider]  ciclopirox (PENLAC) 8 % solution Apply topically at bedtime. Apply over nail and surrounding skin. Apply daily over previous coat. After seven (7) days, may remove with alcohol and continue cycle. 06/25/18   Trula Slade, DPM   empagliflozin (JARDIANCE) 25 MG TABS tablet Take 25 mg by mouth daily.    [provider]  Ergocalciferol (VITAMIN D2) 2000 UNITS TABS Take 2,000 Units by mouth daily.    [provider]  levothyroxine (SYNTHROID, LEVOTHROID) 50 MCG tablet Take 50 mcg by mouth daily.     [provider]  NONFORMULARY OR COMPOUNDED ITEM Antifungal solution: Terbinafine 3%, Fluconazole 2%, Tea Tree Oil 5%, Urea 10%, Ibuprofen 2% in DMSO suspension #21mL 08/05/19   [place], Stephani Police, DPM  spironolactone-hydrochlorothiazide (ALDACTAZIDE) 25-25 MG per tablet Take 1 tablet by mouth daily.     [provider]    Family History Family History  Problem Relation Age of Onset  . Liver cancer Father        mets to liver   . Bladder Cancer Father   . Colon cancer Father        mets to colon   . Anesthesia problems Mother        hard to wake post op   . High blood pressure Other   . Diabetes Other   . Arthritis Other   . Anesthesia problems Son        stopped breathing post op   . Anesthesia problems Other        PONV   . Dementia Neg Hx   . Esophageal cancer Neg Hx   . Rectal cancer Neg Hx   . Stomach cancer Neg Hx   . Colon polyps Neg Hx     Social History Social History   Tobacco Use  . Smoking status: Never Smoker  . Smokeless tobacco: Never Used  Substance Use Topics  . Alcohol use: No    Comment: quit 1978  . Drug use: No     Allergies   Acarbose, Atorvastatin, Cinnamon, Crestor [rosuvastatin calcium], Glimepiride, Invokana [canagliflozin], Januvia [sitagliptin], Losartan potassium, Metformin and related, Pravastatin, Sulfamethoxazole, and Sulfa antibiotics   Review of Systems Review of Systems  Constitutional: Negative for fever.  HENT: Negative for sore throat.   Eyes: Negative for visual disturbance.  Respiratory: Negative for shortness of breath.   Cardiovascular: Negative for chest pain.  Gastrointestinal: Negative for abdominal pain.   Genitourinary: Negative for dysuria.  Musculoskeletal: Negative for neck pain.  Skin: Negative for rash.  Neurological: Positive for dizziness and headaches.     Physical Exam Updated Vital Signs BP 131/70 (BP Location: Right Arm)   Pulse 72   Temp 98.5 F (36.9 C) (Oral)  Resp 16   SpO2 98%   Physical Exam Vitals signs and nursing note reviewed.  Constitutional:      General: She is not in acute distress.    Appearance: She is well-developed.  HENT:     Head: Normocephalic.     Comments: Posterior scalp hematoma Eyes:     Conjunctiva/sclera: Conjunctivae normal.  Neck:     Musculoskeletal: Neck supple.  Cardiovascular:     Rate and Rhythm: Normal rate and regular rhythm.     Pulses: Normal pulses.     Heart sounds: No murmur.  Pulmonary:     Effort: Pulmonary effort is normal. No respiratory distress.     Breath sounds: Normal breath sounds.  Abdominal:     Palpations: Abdomen is soft.     Tenderness: There is no abdominal tenderness.  Musculoskeletal: Normal range of motion.     Right lower leg: No edema.     Left lower leg: No edema.  Skin:    General: Skin is warm and dry.     Capillary Refill: Capillary refill takes less than 2 seconds.  Neurological:     General: No focal deficit present.     Mental Status: She is alert and oriented to person, place, and time.     Sensory: No sensory deficit.     Motor: No weakness.     Gait: Gait normal.      ED Treatments / Results  Labs (all labs ordered are listed, but only abnormal results are displayed) Labs Reviewed  CBG MONITORING, ED - Abnormal; Notable for the following components:      Result Value   Glucose-Capillary 179 (*)    All other components within normal limits    EKG None  Radiology Ct Head Wo Contrast  Result Date: 08/24/2019 CLINICAL DATA:  [place] backwards from a ladder and struck her head on concrete. No loss of consciousness. Pt has a palpable knot on the back of her head. C/o  headache and neck pain EXAM: CT HEAD WITHOUT CONTRAST TECHNIQUE: Contiguous axial images were obtained from the base of the skull through the vertex without intravenous contrast. COMPARISON:  Head CT 06/27/2009, head MRI 09/09/2015 FINDINGS: Brain: No evidence of acute infarction, hemorrhage, hydrocephalus, extra-axial collection or mass lesion/mass effect. There is chronic calcification in the basal ganglia as well as in the cerebellum where it has progressed. Vascular: No hyperdense vessel or unexpected calcification. Skull: Normal. Negative for fracture or focal lesion. Sinuses/Orbits: No acute finding. Other: Posterior slightly left scalp soft tissue swelling. IMPRESSION: 1.  No acute intracranial abnormality. 2. Dense calcification in the bilateral basal basal ganglia as well as in the cerebellum. There has been progression of calcification in the cerebellum since 2010. 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 Electronically Signed   By: Audie Pinto M.D.   On: 08/24/2019 15:07   Ct Cervical Spine Wo Contrast  Result Date: 08/24/2019 CLINICAL DATA:  [place] backwards from a ladder and struck her head on concrete. No loss of consciousness. Pt has a palpable knot on the back of her head. C/o headache and neck pain EXAM: CT CERVICAL SPINE WITHOUT CONTRAST TECHNIQUE: Multidetector CT imaging of the cervical spine was performed without intravenous contrast. Multiplanar CT image reconstructions were also generated. COMPARISON:  None. FINDINGS: Alignment: Straightening of the cervical spine which may be related to positioning. Alignment is intact. Skull base and vertebrae: No acute fracture. No primary bone lesion or focal pathologic process. Soft tissues and spinal canal:  No prevertebral fluid or swelling. No visible canal hematoma. Disc levels: Multilevel degenerative disc disease worse in the midthoracic spine. Left greater than right facet hypertrophy worse at C2-3 and C3-4. Upper chest: Negative.  Other: None. IMPRESSION: 1. No CT evidence of acute fracture or subluxation of the cervical spine. Electronically Signed   By: Audie Pinto M.D.   On: 08/24/2019 15:12    Procedures Procedures (including critical care time)  Medications Ordered in ED Medications - No data to display   Initial Impression / Assessment and Plan / ED Course  I have reviewed the triage vital signs and the nursing notes.  Pertinent labs & imaging results that were available during my care of the patient were reviewed by me and considered in my medical decision making (see chart for details).        Patient CT head and neck did not show any acute findings.  The radiologist does commented upon some calcifications which I mentioned to the patient and she has a neurologist which she can arrange follow-up with.  Final Clinical Impressions(s) / ED Diagnoses   Final diagnoses:  Fall, initial encounter  Injury of head, initial encounter    ED Discharge Orders    None       Hayden Rasmussen, MD 08/24/19 2319

## 2019-08-25 ENCOUNTER — Telehealth: Payer: Self-pay

## 2019-08-25 NOTE — Telephone Encounter (Signed)
Patient mentioned that she fell and was in the hospital and was told that she needed to see Dr. Jaynee Eagles. I didn't see any availability on her schedule. Please advise.

## 2019-08-26 NOTE — Telephone Encounter (Signed)
She was on a ladder and fell. Please have her see her primary care about this, I don;t think she needs to be seen here urgently.

## 2019-08-26 NOTE — Telephone Encounter (Signed)
Spoke with pt and gave her Dr. Cathren Laine message. Pt stated she was told to follow up because of the calcifications noted on the CT and stated she was told there was more density compared to previous scan 10 years ago. I advised I would send a message to Dr. Jaynee Eagles and we would call her back. She verbalized appreciation.

## 2019-08-26 NOTE — Telephone Encounter (Signed)
This is a chronic finding, not urgent despite progression. The calcifications have been there for many years. I just saw her in July and she did not have any concerning neurologic changes at that time.  If she feels she has anything urgent, please see primary care. Otherwise she can follow up with me in the next 2-3 months. thanks

## 2019-08-27 NOTE — Telephone Encounter (Signed)
I called the pt & LVM (ok per DPR) advising Dr. Cathren Laine message that the calcifications are chronic and not urgent and we can schedule a follow-up in the next 2-3 months. If she feels she has anything urgent, she can see her primary care. I asked for a call back to schedule the appointment and left office number in message.

## 2019-08-31 DIAGNOSIS — E1165 Type 2 diabetes mellitus with hyperglycemia: Secondary | ICD-10-CM | POA: Diagnosis not present

## 2019-09-03 DIAGNOSIS — R930 Abnormal findings on diagnostic imaging of skull and head, not elsewhere classified: Secondary | ICD-10-CM | POA: Diagnosis not present

## 2019-09-03 DIAGNOSIS — E1169 Type 2 diabetes mellitus with other specified complication: Secondary | ICD-10-CM | POA: Diagnosis not present

## 2019-09-14 DIAGNOSIS — E1165 Type 2 diabetes mellitus with hyperglycemia: Secondary | ICD-10-CM | POA: Diagnosis not present

## 2019-09-18 DIAGNOSIS — R609 Edema, unspecified: Secondary | ICD-10-CM | POA: Diagnosis not present

## 2019-09-22 DIAGNOSIS — Z23 Encounter for immunization: Secondary | ICD-10-CM | POA: Diagnosis not present

## 2019-09-23 DIAGNOSIS — R609 Edema, unspecified: Secondary | ICD-10-CM | POA: Diagnosis not present

## 2019-09-23 DIAGNOSIS — I10 Essential (primary) hypertension: Secondary | ICD-10-CM | POA: Diagnosis not present

## 2019-09-23 DIAGNOSIS — E1165 Type 2 diabetes mellitus with hyperglycemia: Secondary | ICD-10-CM | POA: Diagnosis not present

## 2019-09-23 DIAGNOSIS — E1121 Type 2 diabetes mellitus with diabetic nephropathy: Secondary | ICD-10-CM | POA: Diagnosis not present

## 2019-09-25 ENCOUNTER — Emergency Department (HOSPITAL_COMMUNITY): Payer: Medicare HMO

## 2019-09-25 ENCOUNTER — Encounter (HOSPITAL_COMMUNITY): Payer: Self-pay | Admitting: Emergency Medicine

## 2019-09-25 ENCOUNTER — Other Ambulatory Visit: Payer: Self-pay

## 2019-09-25 ENCOUNTER — Emergency Department (HOSPITAL_COMMUNITY)
Admission: EM | Admit: 2019-09-25 | Discharge: 2019-09-26 | Disposition: A | Discharge status: Home / Self Care | Payer: Medicare HMO | Attending: Emergency Medicine | Admitting: Emergency Medicine

## 2019-09-25 DIAGNOSIS — Z7984 Long term (current) use of oral hypoglycemic drugs: Secondary | ICD-10-CM | POA: Insufficient documentation

## 2019-09-25 DIAGNOSIS — Z79899 Other long term (current) drug therapy: Secondary | ICD-10-CM | POA: Diagnosis not present

## 2019-09-25 DIAGNOSIS — Y998 Other external cause status: Secondary | ICD-10-CM | POA: Insufficient documentation

## 2019-09-25 DIAGNOSIS — Y9301 Activity, walking, marching and hiking: Secondary | ICD-10-CM | POA: Diagnosis not present

## 2019-09-25 DIAGNOSIS — S0990XA Unspecified injury of head, initial encounter: Secondary | ICD-10-CM | POA: Diagnosis not present

## 2019-09-25 DIAGNOSIS — E039 Hypothyroidism, unspecified: Secondary | ICD-10-CM | POA: Diagnosis not present

## 2019-09-25 DIAGNOSIS — I129 Hypertensive chronic kidney disease with stage 1 through stage 4 chronic kidney disease, or unspecified chronic kidney disease: Secondary | ICD-10-CM | POA: Diagnosis not present

## 2019-09-25 DIAGNOSIS — Y92480 Sidewalk as the place of occurrence of the external cause: Secondary | ICD-10-CM | POA: Diagnosis not present

## 2019-09-25 DIAGNOSIS — W19XXXA Unspecified fall, initial encounter: Secondary | ICD-10-CM

## 2019-09-25 DIAGNOSIS — N189 Chronic kidney disease, unspecified: Secondary | ICD-10-CM | POA: Diagnosis not present

## 2019-09-25 DIAGNOSIS — Z7982 Long term (current) use of aspirin: Secondary | ICD-10-CM | POA: Insufficient documentation

## 2019-09-25 DIAGNOSIS — E1122 Type 2 diabetes mellitus with diabetic chronic kidney disease: Secondary | ICD-10-CM | POA: Insufficient documentation

## 2019-09-25 DIAGNOSIS — S0081XA Abrasion of other part of head, initial encounter: Secondary | ICD-10-CM | POA: Diagnosis not present

## 2019-09-25 DIAGNOSIS — S0083XA Contusion of other part of head, initial encounter: Secondary | ICD-10-CM | POA: Diagnosis not present

## 2019-09-25 DIAGNOSIS — W010XXA Fall on same level from slipping, tripping and stumbling without subsequent striking against object, initial encounter: Secondary | ICD-10-CM | POA: Diagnosis not present

## 2019-09-25 NOTE — ED Triage Notes (Signed)
Pt tripped and fell on something in parking lot at [facility] this morning.  Denies LOC.  C/o bruise across forehead and abrasion to L upper check/ L lateral eye.  C/o headache.  History of multiple falls in the past.  Ambulatory with a cane.  Takes ASA- no blood thinners.

## 2019-09-26 DIAGNOSIS — S0081XA Abrasion of other part of head, initial encounter: Secondary | ICD-10-CM | POA: Diagnosis not present

## 2019-09-26 LAB — CBC WITH DIFFERENTIAL/PLATELET
Abs Immature Granulocytes: 0.01 10*3/uL (ref 0.00–0.07)
Basophils Absolute: 0 10*3/uL (ref 0.0–0.1)
Basophils Relative: 0 %
Eosinophils Absolute: 0.1 10*3/uL (ref 0.0–0.5)
Eosinophils Relative: 3 %
HCT: 41.1 % (ref 36.0–46.0)
Hemoglobin: 13.8 g/dL (ref 12.0–15.0)
Immature Granulocytes: 0 %
Lymphocytes Relative: 26 %
Lymphs Abs: 1.2 10*3/uL (ref 0.7–4.0)
MCH: 30.3 pg (ref 26.0–34.0)
MCHC: 33.6 g/dL (ref 30.0–36.0)
MCV: 90.3 fL (ref 80.0–100.0)
Monocytes Absolute: 0.5 10*3/uL (ref 0.1–1.0)
Monocytes Relative: 11 %
Neutro Abs: 2.7 10*3/uL (ref 1.7–7.7)
Neutrophils Relative %: 60 %
Platelets: 203 10*3/uL (ref 150–400)
RBC: 4.55 MIL/uL (ref 3.87–5.11)
RDW: 13.6 % (ref 11.5–15.5)
WBC: 4.6 10*3/uL (ref 4.0–10.5)
nRBC: 0 % (ref 0.0–0.2)

## 2019-09-26 LAB — BASIC METABOLIC PANEL
Anion gap: 12 (ref 5–15)
BUN: 16 mg/dL (ref 8–23)
CO2: 26 mmol/L (ref 22–32)
Calcium: 9.9 mg/dL (ref 8.9–10.3)
Chloride: 101 mmol/L (ref 98–111)
Creatinine, Ser: 0.78 mg/dL (ref 0.44–1.00)
GFR calc Af Amer: >60 mL/min (ref >60)
GFR calc non Af Amer: >60 mL/min (ref >60)
Glucose, Bld: 170 mg/dL — ABNORMAL HIGH (ref 70–99)
Potassium: 3.1 mmol/L — ABNORMAL LOW (ref 3.5–5.1)
Sodium: 139 mmol/L (ref 135–145)

## 2019-09-26 MED ORDER — ACETAMINOPHEN 500 MG PO TABS
1000.0000 mg | ORAL_TABLET | Freq: Once | ORAL | Status: AC
  Administered 2019-09-26: 1000 mg via ORAL
  Filled 2019-09-26: qty 2

## 2019-09-26 NOTE — ED Provider Notes (Signed)
[facility] EMERGENCY DEPARTMENT Provider Note   CSN: [id number] Arrival date & time: 09/25/19  2147     History   Chief Complaint Chief Complaint  Patient presents with  . Fall    HPI [name] is a 68 y.o. female.     The history is provided by the patient and medical records.     68 year old female with history of anxiety, asthma, seasonal allergies, chronic kidney disease, depression, diabetes, fibromyalgia, GERD, hyperlipidemia, hypertension, hypothyroidism, parkinsonian-like gait disturbance, presenting to the ED after a fall.  States she was walking through the parking lot of the [employer] this morning when she tripped over something on the pavement.  She did not lose consciousness.  She went on into the [facility], told employees about this and they reported that multiple other people had had falls because of this as well.  States initially she felt okay but throughout the day she has had worsening headache across her forehead.  She does have some bruising of this area and an abrasion near her left eye.  She denies any visual disturbance, numbness, weakness, dizziness, or confusion.  States she was feeling well prior to the fall.  She is ambulatory with cane due to her baseline gait disturbance.  She has had multiple falls because of this over the past month, she has upcoming appointment with neurology on Tuesday 09/29/19 (3 days from now).    Past Medical History:  Diagnosis Date  . Allergy   . Anxiety    "comes and goes"  . Asthma    dianosed years ago-no meds at this time  . 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 Chronic kidney disease 1997   nepthrotic syndrom with minimal change- in remission per pt   . Depression   . Diabetes mellitus   . Dysrhythmia    "skips beats"  . Fibromyalgia 1980's  . GERD (gastroesophageal reflux disease)    occ- will use Tums and Prolosec  prn   . Head injury 2010  . Headache(784.0) 1980's   on meds  for migraines-well controlled  . Hyperlipemia   . Hypertension   . Hypothyroidism   . Irritable bowel syndrome 1980's  . Morbid obesity ([place])   . Pneumonia 2009   several times over the past several years  . PONV (postoperative nausea and vomiting)   . Recurrent upper respiratory infection (URI)   . Shortness of breath    exertion  . Sleep apnea    no cpap  . Vaginal polyp    benign per pt    Patient Active Problem List   Diagnosis Date Noted  . Essential tremor 06/24/2018  . Migraine without aura 06/24/2018  . Memory changes 08/28/2015  . Stress at home 08/28/2015  . Post-menopausal bleeding 06/16/2011  . Kidney disease 06/16/2011  . Colon polyp 06/16/2011  . Vaginal polyp 06/16/2011  . Chronic diarrhea 06/16/2011  . Diverticulitis 06/16/2011  . DUB (dysfunctional uterine bleeding) 06/16/2011  . Fatty liver 06/16/2011  . Diabetes mellitus 06/16/2011    Past Surgical History:  Procedure Laterality Date  . CHOLECYSTECTOMY  1983  . COLONOSCOPY  2008   DB  . [place] OF UTERUS  1972  . HYSTEROSCOPY W/D&C  06/29/2011   Procedure: DILATATION AND CURETTAGE (D&C) /HYSTEROSCOPY;  Surgeon: Donnamae Jude, MD;  Location: [facility];  Service: Gynecology;  Laterality: N/A;     OB History    Gravida  5  Para  3   Term  3   Preterm  0   AB  2   Living  3     SAB  2   TAB      Ectopic      Multiple      Live Births               Home Medications    Prior to Admission medications   Medication Sig Start Date End Date Taking? Authorizing Provider  ACCU-CHEK AVIVA PLUS test strip  08/16/15   [provider]  ACCU-CHEK SOFTCLIX LANCETS lancets  08/15/15   [provider]  amitriptyline (ELAVIL) 10 MG tablet Take 1 tablet (10 mg total) by mouth at bedtime. 07/02/18   Melvenia Beam, MD  aspirin 325 MG tablet Take 325 mg by mouth at bedtime.    [provider]  atenolol (TENORMIN) 50 MG tablet Take 50 mg by mouth daily.     [provider]  ciclopirox (PENLAC) 8 % solution Apply topically at bedtime. Apply over nail and surrounding skin. Apply daily over previous coat. After seven (7) days, may remove with alcohol and continue cycle. 06/25/18   Trula Slade, DPM  empagliflozin (JARDIANCE) 25 MG TABS tablet Take 25 mg by mouth daily.    [provider]  Ergocalciferol (VITAMIN D2) 2000 UNITS TABS Take 2,000 Units by mouth daily.    [provider]  levothyroxine (SYNTHROID, LEVOTHROID) 50 MCG tablet Take 50 mcg by mouth daily.     [provider]  NONFORMULARY OR COMPOUNDED ITEM Antifungal solution: Terbinafine 3%, Fluconazole 2%, Tea Tree Oil 5%, Urea 10%, Ibuprofen 2% in DMSO suspension #68mL 08/05/19   [place], Stephani Police, DPM  spironolactone-hydrochlorothiazide (ALDACTAZIDE) 25-25 MG per tablet Take 1 tablet by mouth daily.     [provider]    Family History Family History  Problem Relation Age of Onset  . Liver cancer Father        mets to liver   . Bladder Cancer Father   . Colon cancer Father        mets to colon   . Anesthesia problems Mother        hard to wake post op   . High blood pressure Other   . Diabetes Other   . Arthritis Other   . Anesthesia problems Son        stopped breathing post op   . Anesthesia problems Other        PONV   . Dementia Neg Hx   . Esophageal cancer Neg Hx   . Rectal cancer Neg Hx   . Stomach cancer Neg Hx   . Colon polyps Neg Hx     Social History Social History   Tobacco Use  . Smoking status: Never Smoker  . Smokeless tobacco: Never Used  Substance Use Topics  . Alcohol use: No    Comment: quit 1978  . Drug use: No     Allergies   Acarbose, Atorvastatin, Cinnamon, Crestor [rosuvastatin calcium], Glimepiride, Invokana [canagliflozin], Januvia [sitagliptin], Losartan potassium, Metformin and related, Pravastatin, Sulfamethoxazole, and Sulfa antibiotics   Review of Systems Review of Systems   Neurological: Positive for headaches.  All other systems reviewed and are negative.    Physical Exam Updated Vital Signs BP 139/79 (BP Location: Right Arm)   Pulse 85   Temp 98.6 F (37 C) (Oral)   Resp 19   SpO2 99%   Physical Exam  Vitals signs and nursing note reviewed.  Constitutional:      Appearance: She is well-developed.  HENT:     Head: Normocephalic and atraumatic.     Comments: Bruising across the forehead, mild swelling, no skull depression Eyes:     Conjunctiva/sclera: Conjunctivae normal.     Pupils: Pupils are equal, round, and reactive to light.     Comments: Abrasions to left inferior eye and eyebrow without hematoma or bony deformity, EOMs intact, PERRL  Neck:     Musculoskeletal: Normal range of motion.  Cardiovascular:     Rate and Rhythm: Normal rate and regular rhythm.     Heart sounds: Normal heart sounds.  Pulmonary:     Effort: Pulmonary effort is normal.     Breath sounds: Normal breath sounds.  Abdominal:     General: Bowel sounds are normal.     Palpations: Abdomen is soft.  Musculoskeletal: Normal range of motion.  Skin:    General: Skin is warm and dry.  Neurological:     Mental Status: She is alert and oriented to person, place, and time.     Comments: AAOx3, answering questions and following commands appropriately; equal strength UE and LE bilaterally; CN grossly intact; moves all extremities appropriately without ataxia; no focal neuro deficits or facial asymmetry appreciated, speech is clear and goal oriented      ED Treatments / Results  Labs (all labs ordered are listed, but only abnormal results are displayed) Labs Reviewed  BASIC METABOLIC PANEL - Abnormal; Notable for the following components:      Result Value   Potassium 3.1 (*)    Glucose, Bld 170 (*)    All other components within normal limits  CBC WITH DIFFERENTIAL/PLATELET  PARATHYROID HORMONE, INTACT (NO CA)    EKG None  Radiology Ct Head Wo Contrast   Result Date: 09/25/2019 CLINICAL DATA:  Fall, head injury EXAM: CT HEAD WITHOUT CONTRAST TECHNIQUE: Contiguous axial images were obtained from the base of the skull through the vertex without intravenous contrast. COMPARISON:  CT head 08/24/2019 FINDINGS: Brain: Negative for acute infarct. Negative for acute hemorrhage or mass. Chronic dense calcification in the basal ganglia and cerebellum bilaterally is chronic and unchanged. Vascular: Negative for hyperdense vessel Skull: Negative Sinuses/Orbits: Negative Other: None IMPRESSION: No acute abnormality and no change from prior study. Extensive calcification basal ganglia and cerebellum is chronic. Correlate with calcium levels. Electronically Signed   By: Franchot Gallo M.D.   On: 09/25/2019 22:39    Procedures Procedures (including critical care time)  Medications Ordered in ED Medications  acetaminophen (TYLENOL) tablet 1,000 mg (1,000 mg Oral Given 09/26/19 0204)     Initial Impression / Assessment and Plan / ED Course  I have reviewed the triage vital signs and the nursing notes.  Pertinent labs & imaging results that were available during my care of the patient were reviewed by me and considered in my medical decision making (see chart for details).  68 year old female here after a fall.  She tripped over something in the [facility] parking lot and struck her head on the pavement, denies loss of consciousness.  Was initially okay but has been having some ongoing headache throughout the day today.  She takes daily aspirin but no other anticoagulation.  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  She is following up with neurology about this very soon.  She is awake, alert, appropriately oriented here.  She does not  have any focal neurologic deficits.  She does have some bruising across the forehead and abrasion surrounding left eye but no bony deformities.  Pupils are symmetric and reactive  bilaterally, EOMs are fully intact without nystagmus.  CT of the head was obtained from triage without any acute traumatic findings, however she does have extensive calcifications of her basal ganglia and cerebellum.  This appears chronic but recommended to correlate with calcium levels..  I discussed this with patient and she does report this is been mentioned to her on occasion in the past, but states she has never actually had any ongoing evaluation or further work-up about this.  We will plan for CBC, BMP, and add on PTH.  Patient given Tylenol for headache.  Labs reassuring, specifically calcium levels are normal.  PTH pending but lower suspicion for acute abnormalities with this given normal serum calcium.  Feel she is stable for discharge to follow-up with her neurologist on Tuesday.  She was given copies of her labs and imaging findings.  She is comfortable with plan of care.  She will return here for any new or acute changes.  Final Clinical Impressions(s) / ED Diagnoses   Final diagnoses:  Fall, initial encounter    ED Discharge Orders    None       Larene Pickett, PA-C 09/26/19 [id number]    Fatima Blank, MD 09/26/19 [phone]

## 2019-09-26 NOTE — Discharge Instructions (Signed)
CT of the head did not the calcium deposits again, but your calcium levels were normal. Copies of labs and imaging on back, I would let your neurologist look over them at your appt. Return here for any new/acute changes.

## 2019-09-27 LAB — PARATHYROID HORMONE, INTACT (NO CA): PTH: 32 pg/mL (ref 15–65)

## 2019-09-28 NOTE — Progress Notes (Addendum)
[name]    Provider:  Dr Jaynee Eagles Referring Provider: Hulan Fess, MD Primary Care Physician:  Hulan Fess, MD  CC:  Memory problems, movement difficulties  Interval history 09/29/2019: Here as a request for calcifications in the brain.  She has a past medical history of anxiety, asthma, chronic kidney disease, depression, diabetes, fibromyalgia, hyperlipidemia, hypertension, hypothyroidism who presented to the ED after a fall several days ago.  She presented with worsening headache.  No loss of consciousness.  CT of the head showed extensive calcifications of her basal ganglia and cerebellum appears chronic.  They checked PTH in the hospital which was normal, they also check calcium.  Calcium levels and PTH were normal.  She was also seen in the emergency room in September after being up on a ladder, experiencing dizziness and lost her balance falling backwards and hit the back of her head. She is having cramping, tremors, more anxiety, not being able to move. She has had migraines for years and also a bad headache due to the fall. Writing is more horrible but not smaller. She has been on atenolol for years and developed an allergy, has developed 15 allergies to meds in the last several years.Sensory changes in the hand  She brought a list and is "falling apart". She has been falling in the last year worsening 6 weeks ago. She has had calcifications even in 2010 on the CT. These are new. When she speaks, a lot of times she says the wrong words.Sheis not walking well. Such a hard time moving. It has worsened terribly. Worsening memory. She never went to the formal neuropsych testing I ordered.   Interval history 06/24/2018: Patient was seen 3 years ago, in 2016,  for memory loss she is here for a new referral of migraines. She has had migraines since she was 38, no FHx known. She has been on amitriptyline for 30 years and now feeling side effects. She feels dizzy and feels it  is the amitriptyline and also makes her tired during the day. She has tried to get off of it for years but the migraines come back and she can't sleep. Sheis under significant stress. She is not taking the medication every night. She feels fatigued during the day.  She snores, she is fatigued during the day a lot, she wakes up with headaches. She can have visual auras but not always. She can't explain the headaches to me because every time one starts she takes amitriptyline and she does not get one. Has numbness in the right hand. She has her tsh managed and checked regularly.   Medications tried: amitriptyline, atenolol, zofran  HPI:  [name] is a 68 y.o. female here as a referral from Dr. Rex Kras for memory problems. She has a PMHx of DM, essential HTN, HLD, hypothyroidism, migraine, hyperlipidemia and anxiety. She is worried she has Alzheimer's dementia. She knows people who have had it and worries. Memory problems started 6 years ago. She fell from the attic and had a severe head injury and bleeding in the brain and broken ribs, spent a week in the hospital 2010. Worsening since then. She has always been bad with directions, she can never remember directions but that is not new. On occasion she has forgotten the date she had an appointment. She forgets where she puts things. She has to write notes to remind her and then she forgets where she puts the notes. She has lost keys. She will decide what she  needs to do and then forgets what she was doing. On occasion she doesn't remember something from the past but for the most part the past she does remember. She has always taken notes but she feels frustrated. There is some stress in the household. Margorie John has PTSD and has emotional problems and daughter has schizoaffective disorder. She is under an enormous amount of stress, it is "really bad". She worries about her kids and grandkids. She denies sadness.She also has dizziness.   Reviewed notes, labs  and imaging from outside physicians, which showed:  Personally reviewed imaging and agree with the following: Small amount of residual hyperdense blood layering within the occipital horn of the right lateral ventricle, nearly completely resolved. Ventricular size is stable without development of hydrocephalus. No new bleeding seen. No reversible brain parenchymal finding.  CT ORBITS and temporal bones  Findings: No fluid in the paranasal sinuses, middle ears or mastoids. No evidence of regional fracture. No soft tissue abnormality of the orbits, orbital apices or globes is identified. Incidental note is made of degenerative disease of the temporomandibular joint on the right. There is no sign of temporal bone fracture. No inner ear abnormality is seen. Ossicles appear normal.  IMPRESSION: Negative examination.   Review of Systems: Patient complains of symptoms per HPI as well as the following symptoms: memory loss, headache, insomnia, sleepiness, snoring, dizziness, tremor. Pertinent negatives per HPI. All others negative.   Social History   Socioeconomic History  . Marital status: Legally Separated    Spouse name: Not on file  . Number of children: 3  . Years of education: 76  . Highest education level: Not on file  Occupational History  . Occupation: Retired   Scientific laboratory technician  . Financial resource strain: Not on file  . Food insecurity    Worry: Not on file    Inability: Not on file  . Transportation needs    Medical: Not on file    Non-medical: Not on file  Tobacco Use  . Smoking status: Never Smoker  . Smokeless tobacco: Never Used  Substance and Sexual Activity  . Alcohol use: No    Comment: quit 1978  . Drug use: No  . Sexual activity: Not Currently  Lifestyle  . Physical activity    Days per week: Not on file    Minutes per session: Not on file  . Stress: Not on file  Relationships  . Social Herbalist on phone: Not on file    Gets  together: Not on file    Attends religious service: Not on file    Active member of club or organization: Not on file    Attends meetings of clubs or organizations: Not on file    Relationship status: Not on file  . Intimate partner violence    Fear of current or ex partner: Not on file    Emotionally abused: Not on file    Physically abused: Not on file    Forced sexual activity: Not on file  Other Topics Concern  . Not on file  Social History Narrative   Lives at home. Her granddaughter lives with her.    Right handed    Family History  Problem Relation Age of Onset  . Liver cancer Father        mets to liver   . Bladder Cancer Father   . Colon cancer Father        mets to colon   . Anesthesia problems  Mother        hard to wake post op   . High blood pressure Other   . Diabetes Other   . Arthritis Other   . Anesthesia problems Son        stopped breathing post op   . Anesthesia problems Other        PONV   . Dementia Neg Hx   . Esophageal cancer Neg Hx   . Rectal cancer Neg Hx   . Stomach cancer Neg Hx   . Colon polyps Neg Hx     Past Medical History:  Diagnosis Date  . Allergy   . Anxiety    "comes and goes"  . Asthma    dianosed years ago-no meds at this time  . 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 Chronic kidney disease 1997   nepthrotic syndrom with minimal change- in remission per pt   . Depression   . Diabetes mellitus   . Dysrhythmia    "skips beats"  . Fibromyalgia 1980's  . GERD (gastroesophageal reflux disease)    occ- will use Tums and Prolosec  prn   . Head injury 2010  . Headache(784.0) 1980's   on meds for migraines-well controlled  . Hyperlipemia   . Hypertension   . Hypothyroidism   . Irritable bowel syndrome 1980's  . Morbid obesity ([place])   . Pneumonia 2009   several times over the past several years  . PONV (postoperative nausea and vomiting)   . Recurrent upper respiratory infection (URI)   . Shortness of  breath    exertion  . Sleep apnea    no cpap  . Vaginal polyp    benign per pt    Past Surgical History:  Procedure Laterality Date  . CHOLECYSTECTOMY  1983  . COLONOSCOPY  2008   DB  . [place] OF UTERUS  1972  . HYSTEROSCOPY W/D&C  06/29/2011   Procedure: DILATATION AND CURETTAGE (D&C) /HYSTEROSCOPY;  Surgeon: Donnamae Jude, MD;  Location: [facility];  Service: Gynecology;  Laterality: N/A;    Current Outpatient Medications  Medication Sig Dispense Refill  . ACCU-CHEK AVIVA PLUS test strip     . ACCU-CHEK SOFTCLIX LANCETS lancets     . amitriptyline (ELAVIL) 10 MG tablet Take 1 tablet (10 mg total) by mouth at bedtime. 30 tablet 11  . aspirin 325 MG tablet Take 325 mg by mouth at bedtime.    . ciclopirox (PENLAC) 8 % solution Apply topically at bedtime. Apply over nail and surrounding skin. Apply daily over previous coat. After seven (7) days, may remove with alcohol and continue cycle. 6.6 mL 1  . Ergocalciferol (VITAMIN D2) 2000 UNITS TABS Take 2,000 Units by mouth daily.    Marland Kitchen levothyroxine (SYNTHROID, LEVOTHROID) 50 MCG tablet Take 50 mcg by mouth daily.     . NONFORMULARY OR COMPOUNDED ITEM Antifungal solution: Terbinafine 3%, Fluconazole 2%, Tea Tree Oil 5%, Urea 10%, Ibuprofen 2% in DMSO suspension #75mL 1 each 3  . spironolactone-hydrochlorothiazide (ALDACTAZIDE) 25-25 MG per tablet Take 2 tablets by mouth daily.     Marland Kitchen atenolol (TENORMIN) 50 MG tablet Take 50 mg by mouth daily.    . empagliflozin (JARDIANCE) 25 MG TABS tablet Take 25 mg by mouth daily.     Current Facility-Administered Medications  Medication Dose Route Frequency Provider Last Rate Last Dose  . 0.9 %  sodium chloride infusion  500 mL Intravenous Once Nandigam, Karleen Hampshire  V, MD        Allergies as of 09/29/2019 - Review Complete 09/29/2019  Allergen Reaction Noted  . Acarbose  05/20/2018  . Atenolol  09/29/2019  . Atorvastatin  05/20/2018  . Cinnamon  06/15/2015  . Crestor [rosuvastatin  calcium]  05/20/2018  . Glimepiride  05/20/2018  . Invokana [canagliflozin]  05/20/2018  . Januvia [sitagliptin]  05/20/2018  . Losartan potassium  05/20/2018  . Metformin and related Nausea And Vomiting and Other (See Comments) 06/26/2011  . Pravastatin  05/20/2018  . Sulfamethoxazole Other (See Comments) 03/24/2007  . Sulfa antibiotics Rash and Other (See Comments) 06/16/2011    Vitals: BP (!) 167/86 (BP Location: Left Arm, Patient Position: Sitting)   Pulse (!) 52   Temp (!) 97.1 F (36.2 C) Comment: taken at front door  Ht 5' 2.5" (1.588 m)   Wt 173 lb (78.5 kg)   BMI 31.14 kg/m  Last Weight:  Wt Readings from Last 1 Encounters:  09/29/19 173 lb (78.5 kg)   Last Height:   Ht Readings from Last 1 Encounters:  09/29/19 5' 2.5" (1.588 m)    Physical exam: Exam: Gen: Anxious, conversant, well nourised, well groomed                     CV: RRR, no MRG. No Carotid Bruits. No peripheral edema, warm, nontender Eyes: Conjunctivae clear without exudates or hemorrhage  Neuro: Detailed Neurologic Exam  Speech:    Speech is normal; fluent and spontaneous with normal comprehension.  Cognition: Patient is oriented to person place and time and situation, fund of knowledge appears appropriate for education level, language is fluent.  Montreal Cognitive Assessment  08/25/2015  Visuospatial/ Executive (0/5) 3  Naming (0/3) 3  Attention: Read list of digits (0/2) 2  Attention: Read list of letters (0/1) 1  Attention: Serial 7 subtraction starting at 100 (0/3) 1  Language: Repeat phrase (0/2) 1  Language : Fluency (0/1) 1  Abstraction (0/2) 1  Delayed Recall (0/5) 2  Orientation (0/6) 6  Total 21  Adjusted Score (based on education) 22   MMSE - Mini Mental State Exam 09/29/2019  Orientation to time 5  Orientation to Place 5  Registration 3  Attention/ Calculation 5  Recall 2  Language- name 2 objects 2  Language- repeat 1  Language- follow 3 step command 3  Language-  read & follow direction 1  Write a sentence 1  Copy design 0  Total score 28    Cranial Nerves:    The pupils are equal, round, and reactive to light. The fundi are flat. Visual fields are full to finger confrontation. Extraocular movements are intact. Trigeminal sensation is intact and the muscles of mastication are normal. The face is symmetric. The palate elevates in the midline. Hearing intact. Voice is normal. Shoulder shrug is normal. The tongue has normal motion without fasciculations.   Coordination:    Normal finger to nose, no dysmetria or ataxia  Gait:    She has slight decrease in arm movement, but good stride and stance, not shuffling.   Motor Observation:    Tremulous voice, head tremor, postural and action tremor. No resting tremor.  Tone:    Normal muscle tone.    Posture:    Posture is normal. normal erect    Strength:mild weakness throughout but slightly poor effort and symmetric throughout.      Sensation: intact to LT     Reflex Exam:  DTR's: Absent AJs  Trace patellars, 1+ bicpes Toes:    The toes are equiv bilaterally.   Clonus:    Clonus is absent.      Assessment/Plan:   68 year old here as a request for calcifications in the brain.  She has a past medical history of anxiety, asthma, chronic kidney disease, depression, diabetes, fibromyalgia, hyperlipidemia, hypertension, hypothyroidism who presented to the ED after a fall several days ago.   Addendu, 10/12/2019: Refer to [facility], abnormal MRI brain(calcifications), memory loss, parkinsonism, brain with calcifications. Fahr? We do not have access to genetic testing, eval at [facility] and see if needed.    tremor: Patient son has essential tremor and she was also dxed in the past with essential tremor.  Worsening with parkinsonian-type gait. Need a DAT Scan to differentiate.   Numbness of the right hand: Recommend EMG nerve conduction study in the past, recommend again.  Memory changes: In the past  recommended f/u for memory changes and neuropsych testing, she declined, will reorder  Calcifications: Have been present since at least 2010 but significantly progressed. Monitor for carbon monoxide - carbon monoxide monitor in the home. Will order a Fahr's genetic test and labs for heavy metals.   Stress: She has significant stress at home still and this is been ongoing for years.  Discussed that stress can worsen multiple medical conditions and headaches.  Patient is looking into ways to deal with her daughter who lives with her who is causing lots of her stress.This is ongoing.  Orders Placed This Encounter  Procedures  . NM BRAIN DATSCAN TUMOR LOC INFLAM SPECT 1 DAY  . Heavy metals, blood  . Ambulatory referral to Neuropsychology  . NCV with EMG(electromyography)     Sleep apnea?:  Patient denies ever been diagnosed with sleep apnea however this is on her problem list.  Discussed sleep apnea untreated can cause multiple sequela including stroke, pulmonary hypertension, cardiovascular issues, headache and many others.  She declines at this time seeing her sleep team.  CC: Dr. Simmie Davies, [facility] [address] [place], [place] [address]  Phone [phone] Fax [phone]  A total of 45 minutes was spent face-to-face with this patient. Over half this time was spent on counseling patient on the    ICD-10-CM   1. Memory loss  R41.3 Ambulatory referral to Neuropsychology    Heavy metals, blood  2. Calcification of brain  G93.89 Heavy metals, blood  3. Paresthesia  R20.2 NCV with EMG(electromyography)  4. Tremor  R25.1 NM BRAIN DATSCAN TUMOR LOC INFLAM SPECT 1 DAY  5. Parkinsonism, unspecified Parkinsonism type ([place])  G20 NM BRAIN DATSCAN TUMOR LOC INFLAM SPECT 1 DAY   diagnosis and different diagnostic and therapeutic options, counseling and coordination of care, risks ans benefits of management, compliance, or risk factor  reduction and education.

## 2019-09-29 ENCOUNTER — Telehealth: Payer: Self-pay | Admitting: *Deleted

## 2019-09-29 ENCOUNTER — Encounter: Payer: Self-pay | Admitting: Psychology

## 2019-09-29 ENCOUNTER — Other Ambulatory Visit: Payer: Self-pay

## 2019-09-29 ENCOUNTER — Ambulatory Visit (INDEPENDENT_AMBULATORY_CARE_PROVIDER_SITE_OTHER): Payer: Medicare HMO | Admitting: Neurology

## 2019-09-29 ENCOUNTER — Encounter: Payer: Self-pay | Admitting: Neurology

## 2019-09-29 VITALS — BP 167/86 | HR 52 | Temp 97.1°F | Ht 62.5 in | Wt 173.0 lb

## 2019-09-29 DIAGNOSIS — R413 Other amnesia: Secondary | ICD-10-CM | POA: Diagnosis not present

## 2019-09-29 DIAGNOSIS — G9389 Other specified disorders of brain: Secondary | ICD-10-CM

## 2019-09-29 DIAGNOSIS — R251 Tremor, unspecified: Secondary | ICD-10-CM

## 2019-09-29 DIAGNOSIS — R202 Paresthesia of skin: Secondary | ICD-10-CM | POA: Diagnosis not present

## 2019-09-29 DIAGNOSIS — G20 Parkinson's disease: Secondary | ICD-10-CM

## 2019-09-29 DIAGNOSIS — Z1388 Encounter for screening for disorder due to exposure to contaminants: Secondary | ICD-10-CM | POA: Diagnosis not present

## 2019-09-29 NOTE — Telephone Encounter (Signed)
The patient called and stated she forgot to tell Dr. Jaynee Eagles earlier that she feels strongly she has mold in the house. She said she has many symptoms and stated her 70 y.o. granddaughter has "huge memory problems" and is very smart. She wanted to make sure this message was given to Dr. Jaynee Eagles right away. I encouraged her to also let PCP know and I assured her I would let Dr. Jaynee Eagles know.

## 2019-09-29 NOTE — Patient Instructions (Addendum)
Monitor for carbon monoxide - carbon monoxide monitor Formal Neurocognitive testing DAT Scan for Parkinson's Disease Spectrum Disorders Memory changes: Formal Memory Testing Test for heavy metals Will send test for Fahr's Disease EMG/NCS for the hands to evaluate for Carpal Tunnel Syndrome

## 2019-09-29 NOTE — Telephone Encounter (Signed)
Noted, thanks!

## 2019-10-01 LAB — HEAVY METALS, BLOOD
Arsenic: 8 ug/L (ref 2–23)
Lead, Blood: 2 ug/dL (ref 0–4)
Mercury: <1 ug/L (ref 0.0–14.9)

## 2019-10-05 ENCOUNTER — Encounter: Payer: Self-pay | Admitting: Psychology

## 2019-10-05 ENCOUNTER — Other Ambulatory Visit: Payer: Self-pay

## 2019-10-05 ENCOUNTER — Ambulatory Visit: Payer: Medicare HMO

## 2019-10-05 ENCOUNTER — Ambulatory Visit (INDEPENDENT_AMBULATORY_CARE_PROVIDER_SITE_OTHER): Payer: Medicare HMO | Admitting: Psychology

## 2019-10-05 DIAGNOSIS — G3184 Mild cognitive impairment, so stated: Secondary | ICD-10-CM | POA: Insufficient documentation

## 2019-10-05 DIAGNOSIS — R413 Other amnesia: Secondary | ICD-10-CM

## 2019-10-05 HISTORY — DX: Mild cognitive impairment of uncertain or unknown etiology: G31.84

## 2019-10-05 NOTE — Progress Notes (Addendum)
NEUROPSYCHOLOGICAL EVALUATION [place] Department of Neurology  Reason for Referral:   Rubyann Grieb is a 68 y.o. Caucasian female referred by Sarina Ill, M.D., to characterize her current cognitive functioning and assist with diagnostic clarity and treatment planning in the context of subjective cognitive decline, neuroimaging suggesting progressive calcification of the basal ganglia, and several psychiatric and medical comorbidities.  Assessment and Plan:   Clinical Impression(s): Overall, Ms. Mccaslin's pattern of performance is suggestive of notable performance variability across nearly all assessed cognitive domains. Relative weaknesses were exhibited across processing speed, cognitive flexibility, verbal fluency, visuospatial functioning, retrieval aspects of verbal memory, and both encoding and retrieval aspects of visual memory. Performance was within normal limits across basic and complex attention, other aspects of executive functioning, receptive language, confrontation naming, visuoconstructional abilities, and consolidation aspects of memory. Overall, given evidence for cognitive dysfunction, coupled with Ms. Cogar's report of intact ADLs, she meets criteria for a Mild Neurocognitive Disorder (formerly "mild cognitive impairment") at the present time.  The etiology of her deficits are unclear at the present time. Her pattern of deficits is somewhat consistent with what can be seen in Parkinson's disease or other movement disorders. However, the presence of Parkinson's disease has yet to be confirmed via neurological and neuroradiological means. Consideration should also be given to Fahr's syndrome (also known as primary familial brain calcification or familial idiopathic basal ganglia calcification), given neuroimaging suggesting progressive calcification of the basal ganglia. As this condition is very rare, scientific literature surrounding  this condition and cognitive functioning is sparse outside of a few case reports. However, these reports suggest that dementia is often an accompanying feature of Fahr's syndrome as the disease progresses. While she experienced what is likely best classified as a moderate traumatic brain injury in 2010, damage was said to involve her cerebellum. While this could help explain worsening balance, damage in this area would not fully explain her pattern of cognitive deficits. Regarding concerns surrounding Alzheimer's disease, Ms. Dainty was able to demonstrate appropriate information consolidation across 2/3 memory measures. This performance, coupled with adequate performance across confrontation naming and visuospatial/constructional tasks make this etiology less likely at the present time. A repeat neuropsychological evaluation will be necessary to assess the trajectory of her cognitive deficits.  Additionally, there are several factors which can create and maintain cognitive inefficiencies pertinent to Ms. Enid Derry. These include ongoing significant psychiatric distress (e.g., anxiety and depression), frequent headaches, chronic pain (fibromyalgia), several cardiovascular and other medical comorbidities, and possible obstructive sleep apnea. It is likely that a combination of these factors negatively influence day-to-day cognitive difficulties which Ms. Mccree has been experiencing presently.  Recommendations: Repeat neuropsychological evaluation in 12-18 months (or sooner if functional decline is noted) is recommended to assess the trajectory of future cognitive decline should it occur. This will also be helpful for continued pursuits towards enhanced diagnostic clarity.  Given that test results were somewhat consistent with Parkinson's disease or other movement disorders, additional neuroimaging (i.e., DAT scan) would likely be beneficial in ruling in or out this condition as opposed to essential  tremor.  Medical records suggest a history of obstructive sleep apnea without current CPAP intervention. However, Ms. Mezo denied the presence of this condition. A laboratory sleep test would be useful in assessing the presence of this condition. Untreated sleep apnea, if present, will place Ms. Morelos at an increased risk for stroke, heart attack, and further cognitive decline.  A combination of medication and psychotherapy has been shown  to be most effective at treating symptoms of anxiety and depression. As such, Ms. Kross is encouraged to speak with her prescribing physician regarding medication adjustments to optimally manage these symptoms. Likewise, Ms. Gittens is encouraged to resume active engagement in short-term psychotherapy to address symptoms of psychiatric distress. She would benefit from an active and collaborative therapeutic environment, rather than one purely supportive in nature. Recommended treatment modalities include Cognitive Behavioral Therapy (CBT) or Acceptance and Commitment Therapy (ACT). She would also benefit from her therapist providing written instructions or tips rather than solely relying on Ms. Bloch's memory.  When learning new information, she would benefit from information being broken up into small, manageable pieces. She may also find it helpful to articulate the material in her own words and in a context to promote encoding at the onset of a new task. This material may need to be repeated multiple times to promote encoding.  To address problems with processing speed, she may wish to consider:   -Ensuring that she is alerted when essential material or instructions are being presented   -Adjusting the speed at which new information is presented   -Allowing additional processing time or a chance to rehearse novel information   -Allowing for more time in comprehending, processing, and responding in conversation   -Repeating and paraphrasing instructions or  conversations aloud  Review of Records:   Ms. Hafele was admitted to [facility] on 05/21/2009 after falling 10 feet from the top step of her attic. There was a positive loss in consciousness of unknown duration stemming from this event, coupled with report of some post-traumatic amnesia. Medical work-up revealed multiple bilateral rib fractures and the presence of a subarachnoid hemorrhage. Initially, her respiratory status was very poor and she could barely activate her incentive spirometer. Follow-up head CT scans suggested improvement in her brain injury during her stay. Ms. Pilson was eventually discharged home to the care of her daughter on 05/28/2019.   Most recently, Ms. Kawamura was seen by [facility] Sarina Ill, M.D.) on 09/29/2019 in the context of memory complaints and neuroimaging suggesting progressive calcifications of the basal ganglia. Dr. Jaynee Eagles noted that Ms. Julia presented to the ED after a fall several days prior. Loss in consciousness was denied; however, she reported persisting headaches. Calcium levels and PTH were normal. Ms. Sabel was also seen in the ED in September 2020 after experiencing dizziness while using a ladder, ultimately falling and hitting the back of her head. Regarding memory complaints, Ms. Commins expressed concerns regarding Alzheimer's disease. Memory difficulties were said to have started approximately 6 years prior. Specific examples included misplacing objects and generalized forgetfulness. Performance on a brief cognitive screening instrument (MMSE) was normal (28/30). During her neurological exam, Dr. Jaynee Eagles reported a slight decrease in arm movement, but good stride and stance without a shuffling gait. She also noted the presence of a tremulous voice, and head, postural, and action tremor. Abnormal posture, dysmetria, and ataxia were denied. Ultimately, Ms. Curles was referred for a comprehensive neuropsychological  evaluation to characterize her cognitive abilities and to assist with diagnostic clarity and future treatment planning.  Head CT on 05/21/2009 revealed acute subarachnoid blood within the left cerebellum in the context of a fall with positive loss in consciousness. Brain MRI on 09/09/2015 revealed mild generalized cortical atrophy and scattered hyperintensities, most notably the left frontal lobe. Head CT on 08/24/2019 revealed dense calcification in the bilateral basal ganglia and cerebellum, with progression since 2010. 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 Head CT on 09/25/2019 revealed no change from her prior study.   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    Chronic kidney disease 1997   Nepthrotic syndrom with minimal change-in remission per pt    Diabetes mellitus    Dysrhythmia    "skips beats"   Fibromyalgia 1980's   Generalized anxiety disorder    GERD (gastroesophageal reflux disease)    occ- will use Tums and Prolosec  prn    Hyperlipemia    Hypertension    Hypothyroidism    Irritable bowel syndrome 1980's   Major depressive disorder    Migraine headaches 1980's   On meds-well controlled   Morbid obesity ([place])    Pneumonia 2009   Several times over the past several years   PONV (postoperative nausea and vomiting)    Recurrent upper respiratory infection (URI)    Sleep apnea    no cpap   Subarachnoid hemorrhage ([place]) 2010   Vaginal polyp    benign per pt    Past Surgical History:  Procedure Laterality Date   CHOLECYSTECTOMY  1983   COLONOSCOPY  2008   DB   DILATION AND CURETTAGE OF UTERUS  1972   HYSTEROSCOPY W/D&C  06/29/2011   Procedure: DILATATION AND CURETTAGE (D&C) /HYSTEROSCOPY;  Surgeon: Donnamae Jude, MD;  Location: [facility];  Service: Gynecology;  Laterality: N/A;    Family History  Problem  Relation Age of Onset   Liver cancer Father        mets to liver    Bladder Cancer Father    Colon cancer Father        mets to colon    Anesthesia problems Mother        hard to wake post op    High blood pressure Other    Diabetes Other    Arthritis Other    Anesthesia problems Son        stopped breathing post op    Tremor Son    Schizophrenia Daughter        Schizoaffective disorder   Anesthesia problems Granddaughter        PONV   Dementia Neg Hx    Esophageal cancer Neg Hx    Rectal cancer Neg Hx    Stomach cancer Neg Hx    Colon polyps Neg Hx      Current Outpatient Medications:    ACCU-CHEK AVIVA PLUS test strip, , Disp: , Rfl:    ACCU-CHEK SOFTCLIX LANCETS lancets, , Disp: , Rfl:    amitriptyline (ELAVIL) 10 MG tablet, Take 1 tablet (10 mg total) by mouth at bedtime., Disp: 30 tablet, Rfl: 11   aspirin 325 MG tablet, Take 325 mg by mouth at bedtime., Disp: , Rfl:    atenolol (TENORMIN) 50 MG tablet, Take 50 mg by mouth daily., Disp: , Rfl:    ciclopirox (PENLAC) 8 % solution, Apply topically at bedtime. Apply over nail and surrounding skin. Apply daily over previous coat. After seven (7) days, may remove with alcohol and continue cycle., Disp: 6.6 mL, Rfl: 1   empagliflozin (JARDIANCE) 25 MG TABS tablet, Take 25 mg by mouth daily., Disp: , Rfl:    Ergocalciferol (VITAMIN D2) 2000 UNITS TABS, Take 2,000 Units by mouth daily., Disp: , Rfl:  levothyroxine (SYNTHROID, LEVOTHROID) 50 MCG tablet, Take 50 mcg by mouth daily. , Disp: , Rfl:    NONFORMULARY OR COMPOUNDED ITEM, Antifungal solution: Terbinafine 3%, Fluconazole 2%, Tea Tree Oil 5%, Urea 10%, Ibuprofen 2% in DMSO suspension #66mL, Disp: 1 each, Rfl: 3   spironolactone-hydrochlorothiazide (ALDACTAZIDE) 25-25 MG per tablet, Take 2 tablets by mouth daily. , Disp: , Rfl:   Current Facility-Administered Medications:    0.9 %  sodium chloride infusion, 500 mL, Intravenous, Once, Nandigam,  Venia Minks, MD  Clinical Interview:   Cognitive Symptoms: Decreased short-term memory: Endorsed. Provided examples included difficulties remembering the details of conversations and the names of familiar individuals, longstanding but increased difficulties with directions, and misplacing objects around the home.  Decreased long-term memory: Denied. Decreased attention/concentration: Denied. Increased ease of distractibility: Denied. Reduced processing speed: Endorsed. Ms. Work described her processing speed as "extremely slow." Difficulties with executive functions: Denied. Difficulties with indecisiveness were described as longstanding in nature and unchanged.  Difficulties with emotion regulation: Denied. Difficulties with receptive language: Denied. Difficulties with word finding: Endorsed. She noted being able to think of the word she wishes to use, but will often speak an entirely different word in conversation. She also noted slurred speech, which has been ongoing for the past 1.5 years.  Decreased visuoperceptual ability: Endorsed.  Trajectory of deficits: According to medical records, memory difficulties have been reported at least for the past 6 years. Ms. Cisse noted cognitive dysfunction for the past 3-4 years, with a noted increased in these difficulties over the past 3-5 months.   Difficulties completing ADLs: Denied.  Additional Medical History: History of traumatic brain injury/concussion: Endorsed. As described above, Ms. Gallegos experienced a subarachnoid hemorrhage stemming from a fall in 2010. Given her report, as well as what is stated in her medical records, this injury would likely be best classified as a moderate traumatic brain injury. In addition, Ms. Reburn noted a high frequency of falls, including several which have resulted in her hitting her head. Within the past few weeks, she reported a fall which resulted in persisting headache symptoms despite no loss in  consciousness. Overall, a numerical estimation for number of concussion/head injuries was unable to be determined.  History of stroke: Denied. History of seizure activity: Denied. History of known exposure to toxins: Denied. Symptoms of chronic pain: Endorsed. Ms. Vanscoyk has a history of fibromyalgia diagnosed in the 93s. Pain symptoms were said to be controlled reasonably well currently.  Experience of frequent headaches/migraines: Endorsed. Frequent headache/migraine symptoms were said to be longstanding in nature, dating back to early adulthood. Traditionally, medication interventions (namely amitriptyline) have been effective at treating these symptoms. However, this effectiveness has seemingly declined over recent months to years.  Frequent instances of dizziness/vertigo: Endorsed. She reported sporadic instances of dizziness which have contributed to her history of falls in the past. Antecedents to dizziness were unknown.   Sensory changes: Endorsed. Ms. Bragdon utilizes reading glasses with a somewhat positive effect. She noted having a cataract in her left eye which has yet to be removed. She also noted diminished hearing (left greater than right) and a diminished sense of smell. Other sensory changes/difficulties (e.g., taste) were denied.  Balance/coordination difficulties: Endorsed. Ms. Graybill reported a history of several falls, generally to her sides. Dizziness was said to contribute to these falls, but not be present in every case. She noted experiences of having "no control over my balance" and feeling as though there are periods where her legs "won't work."  Other motor difficulties: Endorsed. She described the presence of a tremor "all over" and was generally unable to narrow down her experience. She eventually acknowledged that tremulous behaviors appear most impactful in her right hand, but also clarified that this could be due to her being right handed and using this hand for most  tasks. Overall, tremulous behavior was said to include upper and lower extremities bilaterally.  Sleep History: Estimated hours obtained each night: 6-8 hours. Difficulties falling asleep: Denied. Difficulties staying asleep: Endorsed. Ms. Brumby reported waking up to use the restroom or for other unknown reasons a few times throughout the night. She also experienced trouble falling back asleep when this occurs, largely due to the presence of intrusive and/or anxious thoughts.  Feels rested and refreshed upon awakening: Endorsed.  History of snoring: Endorsed. History of waking up gasping for air: Endorsed. This was said to occur very rarely.  Witnessed breath cessation while asleep: Denied. However, medical records do suggest a history of obstructive sleep apnea, stating that Ms. Wendorf does not currently use a CPAP machine.   History of vivid dreaming: Denied. Excessive movement while asleep: Denied. Instances of acting out her dreams: Denied.  Psychiatric/Behavioral Health History: Depression: Endorsed. Depressive symptoms were said to be longstanding in nature, but exacerbated over the past 3 years due to "extreme" family stressors. These generally surrounded her daughter's history of schizoaffective disorder and elongated periods where she was "very sick." Ms. Foos also noted having to raise her 81 year old granddaughter due to the severity of her daughter's mental health condition. Current medication interventions were denied. She noted utilizing an individual psychotherapist for support in the past, but has not engaged in these services regularly since the COVID-19 pandemic disrupted her schedule. Current or remote suicidal ideation, intent, or plan was denied. Anxiety: Endorsed. Symptoms were described as longstanding in nature and generally related to ongoing familial stressors.  Mania: Denied. Trauma History: Endorsed. Ms. Patrone stated that she and her granddaughter have "been  through a lot of trauma" involving her daughter's mental health concerns.  Visual/auditory hallucinations: Denied. Delusional thoughts: Denied.  Tobacco: Denied. Alcohol: Ms. Mcvaugh denied current alcohol consumption. She likewise denied a history of problematic alcohol use, abuse, or dependence.  Recreational drugs: Denied. Caffeine: She reported consuming 2 cups of coffee in the morning.  Academic/Vocational History: Highest level of educational attainment: 12 years. Ms. Hanan described herself as a somewhat poor (C) student in academic settings, stating that all subjects represented a weakness for her.  History of developmental delay: Denied. History of grade repetition: Denied. History of class failures: Denied. Enrollment in special education courses: Denied. History of diagnosed specific learning disability: Denied. History of ADHD: Denied.  Employment: Retired. 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   Evaluation Results:   Behavioral Observations: Ms. Antinori was unaccompanied, arrived to her appointment on time, and was appropriately dressed and groomed. Observed gait and station were within normal limits. Gross motor functioning appeared intact upon informal observation and no abnormal movements (e.g., tremors) were noted during interview. Mild tremors were exhibited while performing motor-based cognitive tasks. She appeared very anxious throughout the clinical interview and expressed explicit symptoms of anxiety surrounding testing procedures. However, these symptoms appeared to subside as testing progressed. Spontaneous speech was fluent and word finding difficulties were not observed during the clinical interview. Her voice did waver throughout the interview; however, this was believed to be due to symptoms of anxiety. Sustained attention was appropriate throughout. Thought processes  were coherent, organized, and normal in content. Task engagement was  adequate and she persisted well when challenged. Overall, Ms. Nile was cooperative with the clinical interview and subsequent testing procedures. Notably, she became tearful while attempting to complete mood-related questionnaires and required these items to be read to her out loud.   Adequacy of Effort: The validity of neuropsychological testing is limited by the extent to which the individual being tested may be assumed to have exerted adequate effort during testing. Ms. Kochis expressed her intention to perform to the best of her abilities and exhibited adequate task engagement and persistence. Scores across stand-alone and embedded performance validity measures were within expectation. As such, the results of the current evaluation are believed to be a valid representation of Ms. Finau's current cognitive functioning.  Test Results: Ms. Cornutt was fully oriented at the time of the current evaluation.  Intellectual abilities based upon educational and vocational attainment were estimated to be in the average range. Premorbid abilities were estimated to be within the average range based upon a single-word reading test.   Processing speed was variable, ranging from the exceptionally low to below average normative ranges. Basic attention was average. More complex attention (e.g., working memory) was also average. Performance across tasks assessing executive functions (e.g., cognitive flexibility, response inhibition, nonverbal abstract reasoning, analytical problem solving) were variable, ranging from the exceptionally low to average normative ranges. A relative weakness was exhibited across tasks assessing cognitive flexibility.  Assessed receptive language abilities were within normal limits. Likewise, Ms. Kluk exhibited few difficulties comprehending task instructions and answered all questions asked of her appropriately. Assessed expressive language (e.g., verbal fluency and confrontation  naming) was variable. Verbal fluency was well below average, while confrontation naming was within normal limits.     Assessed visuospatial/visuoconstructional abilities were variable. Spatial tasks were below average, while constructional tasks were within normal limits.    Learning (i.e., encoding) of novel verbal information was below average, while learning visual information was exceptionally low. Spontaneous delayed recall (i.e., retrieval) of previously learned information was well below average to below average across verbal tests and exceptionally low across a visual test. Retention rates were variable across memory measures. Performance across recognition tasks was variable, suggesting some evidence for appropriate information consolidation. A relative weakness was exhibited across a list learning recognition task.    Results of emotional screening instruments suggested that recent symptoms of generalized anxiety were in the moderate range, while symptoms of depression were within the mild range. A screening instrument assessing recent sleep quality suggested the presence of minimal sleep dysfunction.  Tables of Scores:   Note: This summary of test scores accompanies the interpretive report and should not be considered in isolation without reference to the appropriate sections in the text. Descriptors are based on appropriate normative data and may be adjusted based on clinical judgment. The terms impaired and within normal limits (WNL) are used when a more specific level of functioning cannot be determined.       Effort Testing:   DESCRIPTOR       ACS Word Choice: --- --- Within Expectation  Dot Counting Test: --- --- Within Expectation  CVLT-III Forced Choice Recognition: --- --- Within Expectation  BVMT-R Retention Percentage: --- --- Within Expectation       Orientation:      Raw Score Percentile   NAB Orientation, Form 1 29/29 --- ---       Intellectual Functioning:  Standard Score Percentile   Test of Premorbid Functioning: 99 47 Average       Memory:          Wechsler Memory Scale (WMS-IV):                       Raw Score (Scaled Score) Percentile     Logical Memory I 23/53 (7) 16 Below Average    Logical Memory II 7/39 (5) 5 Well Below Average    Logical Memory Recognition 16/23 10-16 Below Average       [place] Verbal Learning Test (CVLT-III) Brief Form: Raw Score (Scaled/Standard Score) Percentile     Total Trials 1-4 24/36 (89) 23 Below Average    Short-Delay Free Recall 5/9 (5) 5 Well Below Average    Long-Delay Free Recall 4/9 (6) 9 Below Average    Long-Delay Cued Recall 4/9 (4) 2 Well Below Average      Recognition Hits 9/9 (12) 75 Above Average      False Positive Errors 5 (2) <1 Exceptionally Low       Brief Visuospatial Memory Test (BVMT-R), Form 1: Raw Score (T Score) Percentile     Total Trials 1-3 4/36 (20) <1 Exceptionally Low    Delayed Recall 2/12 (24) <1 Exceptionally Low    Recognition Discrimination Index 5 >16 Within Normal Limits      Recognition Hits 6/6 >16 Within Normal Limits      False Positive Errors 1 11-16 Below Average        Attention/Executive Function:          Trail Making Test (TMT): Raw Score (T Score) Percentile     Part A 78 secs.,  2 errors (26) 1 Exceptionally Low    Part B 167 secs.,  0 errors (34) 5 Well Below Average        Scaled Score Percentile   WAIS-IV Coding: 6 9 Below Average       NAB Attention Module, Form 1: T Score Percentile     Digits Forward 54 66 Average    Digits Backwards 46 34 Average       D-KEFS Color-Word Interference Test: Raw Score (Scaled Score) Percentile     Color Naming 43 secs. (6) 9 Below Average    Word Reading 34 secs. (5) 5 Well Below Average    Inhibition 96 secs. (5) 5 Well Below Average      Total Errors 2 errors (10) 50 Average    Inhibition/Switching 130 secs. (2) <1 Exceptionally Low      Total Errors 1 error (12) 75 Above Average         D-KEFS 20 Questions Test: Scaled Score Percentile     Total Weighted Achievement Score 10 50 Average    Initial Abstraction Score 6 9 Below Average       Language:          Verbal Fluency Test: Raw Score (T Score) Percentile     Phonemic Fluency (FAS) 24 (35) 7 Well Below Average    Animal Fluency 11 (31) 3 Well Below Average             NAB Language Module, Form 2: T Score Percentile     Auditory Comprehension 58 79 Above Average    Naming 40 16 Below Average       Visuospatial/Visuoconstruction:      Raw Score Percentile   Clock Drawing: 9/10 --- Within Normal Limits  NAB Spatial Module, Form 2: T Score Percentile     Visual Discrimination 38 12 Below Average    Figure Drawing Copy 57 75 Above Average        Scaled Score Percentile   WAIS-IV Matrix Reasoning: 7 16 Below Average       Mood and Personality:      Raw Score Percentile   Geriatric Depression Scale: 13 --- Mild  Geriatric Anxiety Scale: 22 --- Moderate    Somatic 11 --- Moderate    Cognitive 6 --- Mild    Affective 5 --- Mild       Additional Questionnaires:      Raw Score Percentile   PROMIS Sleep Disturbance Questionnaire: 19 --- None to Slight   Informed Consent and Coding/Compliance:   Ms. Osio was provided with a verbal description of the nature and purpose of the present neuropsychological evaluation. Also reviewed were the foreseeable risks and/or discomforts and benefits of the procedure, limits of confidentiality, and mandatory reporting requirements of this provider. The patient was given the opportunity to ask questions and receive answers about the evaluation. Oral consent to participate was provided by the patient.   This evaluation was conducted by Christia Reading, Ph.D., licensed clinical neuropsychologist. Ms. Capurro completed a 30-minute clinical interview, billed as one unit [phone], and 135 minutes of cognitive testing, billed as one unit [phone] and four additional units 96139.  Psychometrist Cruzita Lederer, B.S., assisted Dr. Melvyn Novas with test administration and scoring procedures. As a separate and discrete service, Dr. Melvyn Novas spent a total of 180 minutes in interpretation and report writing, billed as one unit 96132 and two units 96133.

## 2019-10-05 NOTE — Progress Notes (Signed)
   Neuropsychology Note   [name] completed 120 minutes of neuropsychological testing with technician, Cruzita Lederer, B.S., under the supervision of Dr. Christia Reading, Ph.D., licensed neuropsychologist. The patient did not appear overtly distressed by the testing session, per behavioral observation or via self-report to the technician. Rest breaks were offered.    In considering the patient's current level of functioning, level of presumed impairment, nature of symptoms, emotional and behavioral responses during the interview, level of literacy, and observed level of motivation/effort, a battery of tests was selected and communicated to the psychometrician.   Communication between the psychologist and technician was ongoing throughout the testing session and changes were made as deemed necessary based on patient performance on testing, technician observations and additional pertinent factors such as those listed above.   [name] will return within approximately two weeks for an interactive feedback session with Dr. Melvyn Novas at which time his test performances, clinical impressions, and treatment recommendations will be reviewed in detail. The patient understands she can contact our office should she require our assistance before this time.   Full report to follow.  120 minutes were spent face-to-face with patient administering standardized tests and 15 minutes were spent scoring (technician). [CPT T656887, [id number]

## 2019-10-08 ENCOUNTER — Telehealth: Payer: Self-pay | Admitting: *Deleted

## 2019-10-08 NOTE — Telephone Encounter (Signed)
-----   Message from Melvenia Beam, MD sent at 10/04/2019  7:31 PM EDT ----- Lbs normal. Also let her know we are looking into how to order Fahrs genetic testing, we may have to send her to [facility] thanks

## 2019-10-08 NOTE — Telephone Encounter (Signed)
Spoke with [name] and advised her of Dr. Cathren Laine message regarding lab results and possible referral. She verbalized understanding and agreed to be referred to [facility] if needed for the genetic testing for [employer]. She would prefer a later appt in the day due to her car. I advised her if the referral is sent to d/w [facility] when they call to schedule. She verbalized appreciation for the call.

## 2019-10-12 ENCOUNTER — Ambulatory Visit (INDEPENDENT_AMBULATORY_CARE_PROVIDER_SITE_OTHER): Payer: Medicare HMO | Admitting: Psychology

## 2019-10-12 ENCOUNTER — Other Ambulatory Visit: Payer: Self-pay

## 2019-10-12 ENCOUNTER — Encounter: Payer: Self-pay | Admitting: Psychology

## 2019-10-12 ENCOUNTER — Other Ambulatory Visit: Payer: Self-pay | Admitting: Neurology

## 2019-10-12 DIAGNOSIS — R413 Other amnesia: Secondary | ICD-10-CM

## 2019-10-12 DIAGNOSIS — R9089 Other abnormal findings on diagnostic imaging of central nervous system: Secondary | ICD-10-CM

## 2019-10-12 DIAGNOSIS — G238 Other specified degenerative diseases of basal ganglia: Secondary | ICD-10-CM

## 2019-10-12 DIAGNOSIS — G3184 Mild cognitive impairment, so stated: Secondary | ICD-10-CM | POA: Diagnosis not present

## 2019-10-12 NOTE — Progress Notes (Signed)
   Neuropsychology Feedback Session [name]. [facility] Department of Neurology  Reason for Referral:   [name] a 68 y.o. Caucasian female referred by [name], M.D.,to characterize hercurrent cognitive functioning and assist with diagnostic clarity and treatment planning in the context of subjective cognitive decline, neuroimaging suggesting progressive calcification of the basal ganglia, and several psychiatric and medical comorbidities.  Feedback:   [name] completed a comprehensive neuropsychological evaluation on 10/05/2019. Please refer to that encounter for the full report. Briefly, results suggested notable performance variability across nearly all assessed cognitive domains. Relative weaknesses were exhibited across processing speed, cognitive flexibility, verbal fluency, visuospatial functioning, retrieval aspects of verbal memory, and both encoding and retrieval aspects of visual memory. Given evidence for cognitive dysfunction, coupled with [name]'s report of intact ADLs, she meets criteria for a mild neurocognitive disorder (formerly "mild cognitive impairment") at the present time. Her etiology is likely multifactorial in nature and may include Parkinson's disease, Fahr's syndrome, history of cerebellar subarachnoid hemorrhage, moderate anxiety, mild depression, and additional medical comorbidities.  Recommendations included consideration of a DAT scan to rule in or out Parkinson's disease, a repeat neuropsychological evaluation in 12-18 months, and follow-up with a sleep specialist and ENT. Information should be presented to [name] in smaller, bite-sized chunks of information whenever possible.  [name] was unaccompanied. Content of the current session focused on the results of her evaluation and all possible etiologies. We discussed what is mentioned above, as well as the potential contribution of untreated sleep apnea,  decreased blood oxygen levels, and chronic mold exposure. Strong concerns for Alzheimer's disease were not expressed; however, continued monitoring will be important moving forward. [name] was given the opportunity to ask questions and all her questions were answered. she was also encouraged to reach out should additional questions arise. A copy of her report was provided at the conclusion of the visit.      A total of 40 minutes were spent with [name] during the current feedback session.

## 2019-10-13 ENCOUNTER — Telehealth: Payer: Self-pay | Admitting: Neurology

## 2019-10-13 NOTE — Telephone Encounter (Signed)
Faxed recorders to [facility].

## 2019-10-13 NOTE — Telephone Encounter (Signed)
I have called [facility] has review records . [facility] will call the daughter to schedule .  Fax cover sheet  Telephone [phone] - fax [phone] .  I have called the daughter and left message telling her to get CT's from [facility] and MRI from [facility] .

## 2019-10-14 DIAGNOSIS — R609 Edema, unspecified: Secondary | ICD-10-CM | POA: Diagnosis not present

## 2019-10-14 DIAGNOSIS — I10 Essential (primary) hypertension: Secondary | ICD-10-CM | POA: Diagnosis not present

## 2019-10-14 DIAGNOSIS — F419 Anxiety disorder, unspecified: Secondary | ICD-10-CM | POA: Diagnosis not present

## 2019-10-14 DIAGNOSIS — R002 Palpitations: Secondary | ICD-10-CM | POA: Diagnosis not present

## 2019-10-28 DIAGNOSIS — R002 Palpitations: Secondary | ICD-10-CM | POA: Diagnosis not present

## 2019-10-28 DIAGNOSIS — R609 Edema, unspecified: Secondary | ICD-10-CM | POA: Diagnosis not present

## 2019-10-28 DIAGNOSIS — F419 Anxiety disorder, unspecified: Secondary | ICD-10-CM | POA: Diagnosis not present

## 2019-10-28 DIAGNOSIS — I10 Essential (primary) hypertension: Secondary | ICD-10-CM | POA: Diagnosis not present

## 2019-10-29 ENCOUNTER — Encounter (HOSPITAL_COMMUNITY)
Admission: RE | Admit: 2019-10-29 | Discharge: 2019-10-29 | Disposition: A | Discharge status: Home / Self Care | Payer: Medicare HMO | Source: Ambulatory Visit | Attending: Neurology | Admitting: Neurology

## 2019-10-29 ENCOUNTER — Other Ambulatory Visit: Payer: Self-pay

## 2019-10-29 DIAGNOSIS — G20 Parkinson's disease: Secondary | ICD-10-CM | POA: Insufficient documentation

## 2019-10-29 DIAGNOSIS — R251 Tremor, unspecified: Secondary | ICD-10-CM

## 2019-10-29 DIAGNOSIS — R200 Anesthesia of skin: Secondary | ICD-10-CM | POA: Diagnosis not present

## 2019-10-29 MED ORDER — IOFLUPANE I 123 185 MBQ/2.5ML IV SOLN
5.0000 | Freq: Once | INTRAVENOUS | Status: AC | PRN
  Administered 2019-10-29: 5 via INTRAVENOUS
  Filled 2019-10-29: qty 5

## 2019-10-29 MED ORDER — IODINE STRONG (LUGOLS) 5 % PO SOLN
0.8000 mL | Freq: Once | ORAL | Status: AC
  Administered 2019-10-29: 0.8 mL via ORAL

## 2019-11-02 ENCOUNTER — Telehealth: Payer: Self-pay | Admitting: Neurology

## 2019-11-02 NOTE — Telephone Encounter (Signed)
Pt called wanting to know if her DAT Scan results have come in. Pt states they informed her that we should have the results the next day. Please advise.

## 2019-11-02 NOTE — Telephone Encounter (Signed)
Notes recorded by Melvenia Beam, MD on 10/29/2019 at 4:57 PM EST  Negative for Parkinson's disease. We referred her to [facility] movement disorders for further assessment. thanks

## 2019-11-03 NOTE — Telephone Encounter (Signed)
I spoke with the patient and discussed her results of the DAT scan. Pt stated she is scheduled with [facility] for next September 2021. She is going to call back soon to see if there were any cancellations. Pt wanted Dr. Jaynee Eagles to know she is going to see a cardiologist because she thinks her heart may be involved and she is getting "sicker by the day". She stated she has developed allergies to meds she has been on for 30 years. She is no longer taking any medications for BP or diabetes d/t severe side effects or allergies. She may start Ozempic. She is taking her BP daily. She did not have her most recent allergy list with her to update today. She said her PCP is involved. She will see Dr. Jaynee Eagles for EMG on 12/3. She verbalized appreciation for the call.

## 2019-11-06 ENCOUNTER — Encounter: Payer: Self-pay | Admitting: Podiatry

## 2019-11-06 ENCOUNTER — Ambulatory Visit (INDEPENDENT_AMBULATORY_CARE_PROVIDER_SITE_OTHER): Payer: Medicaid Other | Admitting: Podiatry

## 2019-11-06 ENCOUNTER — Other Ambulatory Visit: Payer: Self-pay

## 2019-11-06 DIAGNOSIS — M79675 Pain in left toe(s): Secondary | ICD-10-CM | POA: Diagnosis not present

## 2019-11-06 DIAGNOSIS — M79674 Pain in right toe(s): Secondary | ICD-10-CM

## 2019-11-06 DIAGNOSIS — B351 Tinea unguium: Secondary | ICD-10-CM

## 2019-11-06 NOTE — Patient Instructions (Signed)
Diabetes Mellitus and Foot Care Foot care is an important part of your health, especially when you have diabetes. Diabetes may cause you to have problems because of poor blood flow (circulation) to your feet and legs, which can cause your skin to:  Become thinner and drier.  Break more easily.  Heal more slowly.  Peel and crack. You may also have nerve damage (neuropathy) in your legs and feet, causing decreased feeling in them. This means that you may not notice minor injuries to your feet that could lead to more serious problems. Noticing and addressing any potential problems early is the best way to prevent future foot problems. How to care for your feet Foot hygiene  Wash your feet daily with warm water and mild soap. Do not use hot water. Then, pat your feet and the areas between your toes until they are completely dry. Do not soak your feet as this can dry your skin.  Trim your toenails straight across. Do not dig under them or around the cuticle. File the edges of your nails with an emery board or nail file.  Apply a moisturizing lotion or petroleum jelly to the skin on your feet and to dry, brittle toenails. Use lotion that does not contain alcohol and is unscented. Do not apply lotion between your toes. Shoes and socks  Wear clean socks or stockings every day. Make sure they are not too tight. Do not wear knee-high stockings since they may decrease blood flow to your legs.  Wear shoes that fit properly and have enough cushioning. Always look in your shoes before you put them on to be sure there are no objects inside.  To break in new shoes, wear them for just a few hours a day. This prevents injuries on your feet. Wounds, scrapes, corns, and calluses  Check your feet daily for blisters, cuts, bruises, sores, and redness. If you cannot see the bottom of your feet, use a mirror or ask someone for help.  Do not cut corns or calluses or try to remove them with medicine.  If you  find a minor scrape, cut, or break in the skin on your feet, keep it and the skin around it clean and dry. You may clean these areas with mild soap and water. Do not clean the area with peroxide, alcohol, or iodine.  If you have a wound, scrape, corn, or callus on your foot, look at it several times a day to make sure it is healing and not infected. Check for: ? Redness, swelling, or pain. ? Fluid or blood. ? Warmth. ? Pus or a bad smell. General instructions  Do not cross your legs. This may decrease blood flow to your feet.  Do not use heating pads or hot water bottles on your feet. They may burn your skin. If you have lost feeling in your feet or legs, you may not know this is happening until it is too late.  Protect your feet from hot and cold by wearing shoes, such as at the beach or on hot pavement.  Schedule a complete foot exam at least once a year (annually) or more often if you have foot problems. If you have foot problems, report any cuts, sores, or bruises to your health care provider immediately. Contact a health care provider if:  You have a medical condition that increases your risk of infection and you have any cuts, sores, or bruises on your feet.  You have an injury that is not   healing.  You have redness on your legs or feet.  You feel burning or tingling in your legs or feet.  You have pain or cramps in your legs and feet.  Your legs or feet are numb.  Your feet always feel cold.  You have pain around a toenail. Get help right away if:  You have a wound, scrape, corn, or callus on your foot and: ? You have pain, swelling, or redness that gets worse. ? You have fluid or blood coming from the wound, scrape, corn, or callus. ? Your wound, scrape, corn, or callus feels warm to the touch. ? You have pus or a bad smell coming from the wound, scrape, corn, or callus. ? You have a fever. ? You have a red line going up your leg. Summary  Check your feet every day  for cuts, sores, red spots, swelling, and blisters.  Moisturize feet and legs daily.  Wear shoes that fit properly and have enough cushioning.  If you have foot problems, report any cuts, sores, or bruises to your health care provider immediately.  Schedule a complete foot exam at least once a year (annually) or more often if you have foot problems. This information is not intended to replace advice given to you by your health care provider. Make sure you discuss any questions you have with your health care provider. Document Released: 11/30/2000 Document Revised: 01/15/2018 Document Reviewed: 01/04/2017 Elsevier Patient Education  2020 Elsevier Inc.  

## 2019-11-14 NOTE — Progress Notes (Signed)
Subjective: [name] is seen today for follow up painful, elongated, thickened toenails bilateral feet that she cannot cut. Pain interferes with daily activities. Aggravating factor includes wearing enclosed shoe gear and relieved with periodic debridement.  She relates left great toe feels ingrown. She denies any redness, drainage or swelling.   Medications reviewed in chart.  Allergies  Allergen Reactions  . Acarbose   . Atenolol     Stomach problems, chills  . Atorvastatin   . Cinnamon     Stomach pain  . Crestor [Rosuvastatin Calcium]   . Glimepiride   . Invokana [Canagliflozin]   . Januvia [Sitagliptin]   . Losartan Potassium   . Metformin And Related Nausea And Vomiting and Other (See Comments)    Patient states that she has severe chills, headache and cramping additionally. Says blood sugar is uncontrolled because of this  . Pravastatin   . Sulfamethoxazole Other (See Comments)    REACTION: unspecified  . Sulfa Antibiotics Rash and Other (See Comments)    Mother has told patient in the past not to take, she cannot recall there reaction     Objective:  Vascular Examination: Capillary refill time immediate b/l.  Dorsalis pedis present b/l.  Posterior tibial pulses present b/l.  Digital hair absent b/l.  Skin temperature gradient WNL b/l.   Dermatological Examination: Skin thin, shiny and atrophic b/l.  Toenails b/l great toes and right 5th digit are discolored, thick, dystrophic with subungual debris and pain with palpation to nailbeds due to thickness of nails. Incurvated nailplate left great toe lateral border with tenderness to palpation. No erythema, no edema, no drainage noted.  Musculoskeletal: Muscle strength 5/5 to all LE muscle groups b/l.  No gross bony deformities b/l.  No pain, crepitus or joint limitation noted with ROM.   Neurological Examination: Protective sensation intact with 10 gram monofilament  bilaterally.  Assessment: Painful onychomycosis toenails b/l great toes and right 5th digit  NIDDM   Plan: 1. Toenails b/l great toes and right 5th digit were debrided in length and girth without iatrogenic bleeding. Offending nail border debrided and curretaged left hallux. Border cleansed with alcohol and triple antibiotic applied. No further treatment required by patient/caregiver. 2. Patient to continue soft, supportive shoe gear daily. 3. Patient to report any pedal injuries to medical professional immediately. 4. Follow up 3 months.  5. Patient/POA to call should there be a concern in the interim.

## 2019-11-16 DIAGNOSIS — E1129 Type 2 diabetes mellitus with other diabetic kidney complication: Secondary | ICD-10-CM | POA: Diagnosis not present

## 2019-11-19 ENCOUNTER — Ambulatory Visit (INDEPENDENT_AMBULATORY_CARE_PROVIDER_SITE_OTHER): Payer: Medicare HMO | Admitting: Neurology

## 2019-11-19 ENCOUNTER — Other Ambulatory Visit: Payer: Self-pay

## 2019-11-19 DIAGNOSIS — R202 Paresthesia of skin: Secondary | ICD-10-CM

## 2019-11-19 DIAGNOSIS — M5412 Radiculopathy, cervical region: Secondary | ICD-10-CM

## 2019-11-19 DIAGNOSIS — Z0289 Encounter for other administrative examinations: Secondary | ICD-10-CM

## 2019-11-19 DIAGNOSIS — R29898 Other symptoms and signs involving the musculoskeletal system: Secondary | ICD-10-CM | POA: Diagnosis not present

## 2019-11-19 NOTE — Progress Notes (Addendum)
Full Name: [name] Gender: Female MRN #: [id number] Date of Birth: Jan 21, 1951    Visit Date: 11/19/2019 09:35 Age: 68 Years Examining Physician: Sarina Ill, MD  Referring Physician: Hulan Fess, MD    History: EMG/NCS to evaluate numbness of the hand  Summary: EMG/NCS was performed on the bilateral upper extremities and right leg.  The left median motor nerve showed delayed distal onset latency (7.2 ms, normal less than 4.4) and decreased conduction velocity (46 m/s, normal greater than 49).  The right median motor nerve showed no response.  The left ulnar motor nerve showed delayed distal onset latency (3.8 ms, normal less than 3.3) and reduced amplitude (2.5 mV, normal greater than 6).  The right ulnar motor nerve showed delayed distal onset latency (4.2 ms, normal less than 3.3) and reduced amplitude (2.9 mV, normal greater than 6).  The right radial sensory nerve was within normal limits.  The right sural and right superficial peroneal sensory nerve showed no response.   The left median/ulnar (palm) comparison nerve showed prolonged distal peak latency (Median Palm, 3.3 ms, N<2.2) and abnormal peak latency difference (Median Palm-Ulnar Palm, 1.2 ms, N<0.4) with a relative median delay.  The right median/ulnar (palm) comparison nerve showed prolonged distal peak latency (Median Palm, NR, N<2.2).   The right median/radial (dig I) comparison nerve showed no response (Median).  The left and right orthodromic median sensory nerves showed no response. All remaining nerves (as indicated in the following tables) were within normal limits.  The left ulnar orthodromic sensory nerve showed delayed distal peak latency (3.3 ms, normal less than 3.1).  The right pronator teres showed increased spontaneous activity and diminished motor unit recruitment.  The right first dorsal interosseous muscle showed increased spontaneous activity, polyphasic motor units, prolonged motor unit duration, and  diminished motor unit recruitment.  The right opponens pollicis showed increased spontaneous activity, patient could not activate due to pain.  This is an incomplete study, patient could not tolerate further EMG needle study.   Conclusion: This is an incomplete study, patient could not tolerate emg needle study. 1.  There is electrophysiologic evidence for bilateral carpal tunnel syndrome.  2. 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   3.  There is acute/ongoing denervation and chronic neurogenic changes in distal muscles of the right arm that share C7/C8 innervation recommend MRI of the cervical spine to evaluate for cervical radiculopathy.  Sarina Ill, M.D.  [facility] [place], [place] [address] Tel: [phone] Fax: [phone]         [facility]    Nerve / Sites Muscle Latency Ref. Amplitude Ref. Rel Amp Segments Distance Velocity Ref. Area    ms ms mV mV %  cm m/s m/s mVms  L Median - APB     Wrist APB 7.2 ?4.4 4.3 ?4.0 100 Wrist - APB 7   15.9     Upper arm APB 11.5  4.2  99.4 Upper arm - Wrist 20 46 ?49 17.3  R Median - APB     Wrist APB NR ?4.4 NR ?4.0 NR Wrist - APB 7   NR     Upper arm APB NR  NR  NR Upper arm - Wrist 21 NR ?49 NR  L Ulnar - ADM     Wrist ADM 3.8 ?3.3 2.5 ?6.0  100 Wrist - ADM 7   3.5     B.Elbow ADM 7.1  1.9  75.2 B.Elbow - Wrist 18 55 ?49 8.0     A.Elbow ADM 8.7  9.5  512 A.Elbow - B.Elbow 10 61 ?49 33.3         A.Elbow - Wrist      R Ulnar - ADM     Wrist ADM 4.2 ?3.3 2.9 ?6.0 100 Wrist - ADM 7   7.4     B.Elbow ADM 7.0  9.1  309 B.Elbow - Wrist 18 63 ?49 32.3     A.Elbow ADM 8.7  11.1  123 A.Elbow - B.Elbow 10 61 ?49 41.4         A.Elbow - Wrist                 SNC    Nerve / Sites Rec. Site Peak Lat Ref.  Amp Ref. Segments  Distance Peak Diff Ref.    ms ms V V  cm ms ms  R Radial - Anatomical snuff box (Forearm)     Forearm Wrist 2.7 ?2.9 16 ?15 Forearm - Wrist 10    R Sural - Ankle (Calf)     Calf Ankle NR ?4.4 NR ?6 Calf - Ankle 14    R Superficial peroneal - Ankle     Lat leg Ankle NR ?4.4 NR ?6 Lat leg - Ankle 14    L Median, Ulnar - Transcarpal comparison     Median Palm Wrist 3.3 ?2.2 2 ?35 Median Palm - Wrist 8       Ulnar Palm Wrist 2.1 ?2.2 9 ?12 Ulnar Palm - Wrist 8          Median Palm - Ulnar Palm  1.2 ?0.4  R Median, Ulnar - Transcarpal comparison     Median Palm Wrist NR ?2.2 NR ?35 Median Palm - Wrist 8       Ulnar Palm Wrist 2.4 ?2.2 14 ?12 Ulnar Palm - Wrist 8          Median Palm - Ulnar Palm  NR ?0.4  R Median, Radial - Thumb comparison     Median Wrist Thumb NR  NR  Median Wrist - Thumb 10       Radial Wrist Thumb 2.8  26  Radial Wrist - Thumb 10          Median Wrist - Radial Wrist  NR ?0.5  L Median - Orthodromic (Dig II, Mid palm)     Dig II Wrist NR ?3.4 NR ?10 Dig II - Wrist 13    R Median - Orthodromic (Dig II, Mid palm)     Dig II Wrist NR ?3.4 NR ?10 Dig II - Wrist 13    R Ulnar - Orthodromic, (Dig V, Mid palm)     Dig V Wrist 3.0 ?3.1 5 ?5 Dig V - Wrist 11    L Ulnar - Orthodromic, (Dig V, Mid palm)     Dig V Wrist 3.3 ?3.1 7 ?5 Dig V - Wrist 28                           F  Wave    Nerve F Lat Ref.   ms ms  L Ulnar - ADM 29.7 ?32.0  R Ulnar - ADM 31.1 ?32.0         EMG Summary Table    Spontaneous MUAP Recruitment  Muscle  IA Fib PSW Fasc Other Amp Dur. Poly Pattern  R. Deltoid Normal None None None _______ Normal Normal Normal Normal  R. Cervical paraspinals (low) Normal None None None _______ Normal Normal Normal Normal  R. Triceps brachii Normal None None None _______ Normal Normal Normal Normal  R. Flexor digitorum profundus (Ulnar) Normal None None None _______ Normal Normal Normal Normal  R. Pronator teres Normal None 3+ None _______ Normal Normal Normal  Reduced  R. First dorsal interosseous Normal None 2+ None _______ Normal Increased 3+ Reduced  R. Opponens pollicis Normal None 3+ None _______

## 2019-11-21 NOTE — Progress Notes (Signed)
See procedure note.

## 2019-11-21 NOTE — Progress Notes (Signed)
Patient here for EMG/NCS of hand. We discussed her stress and anxiety and how this can be affecting her. We reviewed the NM DAT Scan which was negative for parkinson's Disease Spectrum disoder. She says she has mold in her home and she is having all the symptoms of mold toxicity, I advised f/u with pcp as well as having her home inspected and treated asap. We will order an MRI of the cervical spine due to acute/ongoing denervation seen in a c7/c8 distribution. She also reports  developing allergies to multiple medications recently. Appears to be a large component of social stressors. She did not tolerate any further examination, could not complete the emg/ncs. We discussed social stressors and anxiety can manifest physicially, recommend therapy/talking to a professional. We have also referred her to [facility], calcifications in the brain may be Fahr's disease however we do not have access to genetic testing or counselors, referred to movement disorders team for evaluation.   A total of 25 minutes was spent face-to-face with this patient. Over half this time was spent on counseling patient on the  1. Paresthesias in left hand   2. Paresthesia   3. Left arm weakness    diagnosis and different diagnostic and therapeutic options, counseling and coordination of care, risks ans benefits of management, compliance, or risk factor reduction and education.  This does not include time spent on emg/ncs procedure.

## 2019-11-26 ENCOUNTER — Other Ambulatory Visit: Payer: Self-pay

## 2019-11-26 ENCOUNTER — Telehealth: Payer: Self-pay | Admitting: Radiology

## 2019-11-26 ENCOUNTER — Encounter: Payer: Self-pay | Admitting: Interventional Cardiology

## 2019-11-26 ENCOUNTER — Ambulatory Visit (INDEPENDENT_AMBULATORY_CARE_PROVIDER_SITE_OTHER): Payer: Medicare HMO | Admitting: Interventional Cardiology

## 2019-11-26 VITALS — BP 144/84 | HR 94 | Ht 62.5 in | Wt 179.8 lb

## 2019-11-26 DIAGNOSIS — R072 Precordial pain: Secondary | ICD-10-CM | POA: Diagnosis not present

## 2019-11-26 DIAGNOSIS — I872 Venous insufficiency (chronic) (peripheral): Secondary | ICD-10-CM | POA: Diagnosis not present

## 2019-11-26 DIAGNOSIS — R002 Palpitations: Secondary | ICD-10-CM | POA: Diagnosis not present

## 2019-11-26 DIAGNOSIS — E1149 Type 2 diabetes mellitus with other diabetic neurological complication: Secondary | ICD-10-CM | POA: Diagnosis not present

## 2019-11-26 NOTE — Progress Notes (Signed)
Cardiology Office Note   Date:  11/26/2019   ID:  [name], [name] 06/05/1951, MRN [id number]  PCP:  Hulan Fess, MD    No chief complaint on file.  Palpitations/irregular heart beat  Wt Readings from Last 3 Encounters:  11/26/19 179 lb 12.8 oz (81.6 kg)  09/29/19 173 lb (78.5 kg)  06/24/18 186 lb 6.4 oz (84.6 kg)       History of Present Illness: [name] is a 68 y.o. female who is being seen today for the evaluation of irregular heart beat at the request of Little, Lennette Bihari, MD.  She has had a fall, and brain calcification.  SHe was worked up for Parkinsons, due to fall and tremor. SHe is seeing neurology.  Of late, she has ben retaining some fluid in her legs, starting in August of 2020 approximately.  SHe has been on furosemide without much relief.    She noted that her BP cuff was giving her a "heart symbol."  SHe was feeling palpitations, sometimes in the early morning.  They happen daily.  No syncope.  Palpitations are daily.   She has developed allergies to BP and DM meds.  She reports some chest pains.  These started after the palpitations.  THey occur randomly.  They are dull.  No diagnosis of CAD.  She has not had a stress test.  Past Medical History:  Diagnosis Date  . Allergy   . Asthma    Dianosed years ago-no meds at this time  . Cataract    Right eye removed-still has left cataract   . 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 Diabetes mellitus   . Dysrhythmia    "skips beats"  . Fibromyalgia 1980's  . Generalized anxiety disorder   . GERD (gastroesophageal reflux disease)    occ- will use Tums and Prolosec  prn   . Hyperlipemia   . Hypertension   . Hypothyroidism   . Irritable bowel syndrome 1980's  . Major depressive disorder   . Migraine headaches 1980's   On meds-well controlled  . 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  . Morbid obesity ([place])   . Pneumonia  2009   Several times over the past several years  . PONV (postoperative nausea and vomiting)   . Recurrent upper respiratory infection (URI)   . Sleep apnea    no cpap  . Subarachnoid hemorrhage ([place]) 2010  . Vaginal polyp    benign per pt    Past Surgical History:  Procedure Laterality Date  . CHOLECYSTECTOMY  1983  . COLONOSCOPY  2008   DB  . [place] OF UTERUS  1972  . HYSTEROSCOPY W/D&C  06/29/2011   Procedure: DILATATION AND CURETTAGE (D&C) /HYSTEROSCOPY;  Surgeon: Donnamae Jude, MD;  Location: [facility];  Service: Gynecology;  Laterality: N/A;     Current Outpatient Medications  Medication Sig Dispense Refill  . ACCU-CHEK AVIVA PLUS test strip     . ACCU-CHEK SOFTCLIX LANCETS lancets     . amitriptyline (ELAVIL) 10 MG tablet Take 1 tablet (10 mg total) by mouth at bedtime. 30 tablet 11  . aspirin 325 MG tablet Take 325 mg by mouth at bedtime.    . Ergocalciferol (VITAMIN D2) 2000 UNITS TABS Take 2,000 Units by mouth daily.    . furosemide (LASIX) 20 MG tablet Take 20 mg by mouth daily.    Marland Kitchen levothyroxine (SYNTHROID, LEVOTHROID) 50 MCG tablet  Take 50 mcg by mouth daily.     Marland Kitchen OZEMPIC, 0.25 OR 0.5 MG/DOSE, 2 MG/1.5ML SOPN      Current Facility-Administered Medications  Medication Dose Route Frequency Provider Last Rate Last Admin  . 0.9 %  sodium chloride infusion  500 mL Intravenous Once Nandigam, Kavitha V, MD        Allergies:   Acarbose, Atenolol, Atorvastatin, Cinnamon, Crestor [rosuvastatin calcium], Glimepiride, Invokana [canagliflozin], Januvia [sitagliptin], Losartan potassium, Metformin and related, Pravastatin, Sulfamethoxazole, and Sulfa antibiotics    Social History:  The patient  reports that she has never smoked. She has never used smokeless tobacco. She reports that she does not drink alcohol or use drugs.   Family History:  The patient's family history includes Anesthesia problems in her granddaughter, mother, and son; Arthritis in an other  family member; Bladder Cancer in her father; Colon cancer in her father; Diabetes in an other family member; High blood pressure in an other family member; Liver cancer in her father; Schizophrenia in her daughter; Tremor in her son.    ROS:  Please see the history of present illness.   Otherwise, review of systems are positive for .   All other systems are reviewed and negative.    PHYSICAL EXAM: VS:  BP (!) 144/84   Pulse 94   Ht 5' 2.5" (1.588 m)   Wt 179 lb 12.8 oz (81.6 kg)   SpO2 97%   BMI 32.36 kg/m  , BMI Body mass index is 32.36 kg/m. GEN: Well nourished, well developed, in no acute distress  HEENT: normal  Neck: no JVD, carotid bruits, or masses Cardiac: RRR; no murmurs, rubs, or gallops,no edema  Respiratory:  clear to auscultation bilaterally, normal work of breathing GI: soft, nontender, nondistended, + BS MS: no deformity or atrophy ; spider veins are present Skin: warm and dry, no rash Neuro:  Mildly impaired gait Psych: euthymic mood, full affect   EKG:   The ekg ordered today demonstrates NSR, no ST changes   Recent Labs: 09/26/2019: BUN 16; Creatinine, Ser 0.78; Hemoglobin 13.8; Platelets 203; Potassium 3.1; Sodium 139   Lipid Panel No results found for: CHOL, TRIG, HDL, CHOLHDL, VLDL, LDLCALC, LDLDIRECT   Other studies Reviewed: Additional studies/ records that were reviewed today with results demonstrating: labs reviewed; Cr normal in the past few months.   ASSESSMENT AND PLAN:  1. Chest pain: Will check echocardiogram.  Based on EF, would consider further ischemic testing. She will need high dose of beta blocker for CT scan.  CP is atypical, nonexertional and she has not had CAD diagnosed in the past.  Prior cerebral angio in 2010 showed only minimal plaque.  2. DM: poorly controlled.  A1C 9.4.  Working on control with the PMD.  3. Palpitations: 14 day Zio patch to look for arrhythmia.   4. Fluid retention: Likely venous insufficiency.  Elevate legs  with ankles above the level of her heart.  Also, she could try compression stockings, 15-20 mm Hg.   Current medicines are reviewed at length with the patient today.  The patient concerns regarding her medicines were addressed.  The following changes have been made:  No change  Labs/ tests ordered today include:  No orders of the defined types were placed in this encounter.   Recommend 150 minutes/week of aerobic exercise Low fat, low carb, high fiber diet recommended  Disposition:   FU in 1 year   Signed, Larae Grooms, MD  11/26/2019 11:34 AM  [facility] [address], [place], [place]  [address] Phone: [phone]; Fax: [phone]

## 2019-11-26 NOTE — Telephone Encounter (Signed)
Enrolled patient for a 14 day Zio monitor to be mailed to patients home.  

## 2019-11-26 NOTE — Patient Instructions (Signed)
Medication Instructions:  Your physician recommends that you continue on your current medications as directed. Please refer to the Current Medication list given to you today.  If you need a refill on your cardiac medications before your next appointment, please call your pharmacy.   Lab work: None Ordered  If you have labs (blood work) drawn today and your tests are completely normal, you will receive your results only by: Marland Kitchen MyChart Message (if you have MyChart) OR . A paper copy in the mail If you have any lab test that is abnormal or we need to change your treatment, we will call you to review the results.  Testing/Procedures: Your physician has requested that you have an echocardiogram. Echocardiography is a painless test that uses sound waves to create images of your heart. It provides your doctor with information about the size and shape of your heart and how well your heart's chambers and valves are working. This procedure takes approximately one hour. There are no restrictions for this procedure.  Your physician has recommended that you wear a 14 day monitor. These monitors are medical devices that record the heart's electrical activity. Doctors most often use these monitors to diagnose arrhythmias. Arrhythmias are problems with the speed or rhythm of the heartbeat. The monitor is a small, portable device. You can wear one while you do your normal daily activities. This is usually used to diagnose what is causing palpitations/syncope (passing out).  Follow-up: Based on test results  Any Other Special Instructions Will Be Listed Below (If Applicable).  ELEVATE YOUR LEGS AND WEAR COMPRESSION STOCKINGS TO HELP WITH SWELLING   Echocardiogram An echocardiogram is a procedure that uses painless sound waves (ultrasound) to produce an image of the heart. Images from an echocardiogram can provide important information about:  Signs of coronary artery disease (CAD).  Aneurysm detection.  An aneurysm is a weak or damaged part of an artery wall that bulges out from the normal force of blood pumping through the body.  Heart size and shape. Changes in the size or shape of the heart can be associated with certain conditions, including heart failure, aneurysm, and CAD.  Heart muscle function.  Heart valve function.  Signs of a past heart attack.  Fluid buildup around the heart.  Thickening of the heart muscle.  A tumor or infectious growth around the heart valves. Tell a health care provider about:  Any allergies you have.  All medicines you are taking, including vitamins, herbs, eye drops, creams, and over-the-counter medicines.  Any blood disorders you have.  Any surgeries you have had.  Any medical conditions you have.  Whether you are pregnant or may be pregnant. What are the risks? Generally, this is a safe procedure. However, problems may occur, including:  Allergic reaction to dye (contrast) that may be used during the procedure. What happens before the procedure? No specific preparation is needed. You may eat and drink normally. What happens during the procedure?   An IV tube may be inserted into one of your veins.  You may receive contrast through this tube. A contrast is an injection that improves the quality of the pictures from your heart.  A gel will be applied to your chest.  A wand-like tool (transducer) will be moved over your chest. The gel will help to transmit the sound waves from the transducer.  The sound waves will harmlessly bounce off of your heart to allow the heart images to be captured in real-time motion. The images will  be recorded on a computer. The procedure may vary among health care providers and hospitals. What happens after the procedure?  You may return to your normal, everyday life, including diet, activities, and medicines, unless your health care provider tells you not to do that. Summary  An echocardiogram is a  procedure that uses painless sound waves (ultrasound) to produce an image of the heart.  Images from an echocardiogram can provide important information about the size and shape of your heart, heart muscle function, heart valve function, and fluid buildup around your heart.  You do not need to do anything to prepare before this procedure. You may eat and drink normally.  After the echocardiogram is completed, you may return to your normal, everyday life, unless your health care provider tells you not to do that. This information is not intended to replace advice given to you by your health care provider. Make sure you discuss any questions you have with your health care provider. Document Released: 11/30/2000 Document Revised: 03/26/2019 Document Reviewed: 01/05/2017 Elsevier Patient Education  2020 [employer].   Ambulatory Cardiac Monitoring An ambulatory cardiac monitor is a small recording device that is used to detect abnormal heart rhythms (arrhythmias). Most monitors are connected by wires to flat, sticky disks (electrodes) that are then attached to your chest. You may need to wear a monitor if you have had symptoms such as:  Fast heartbeats (palpitations).  Dizziness.  Fainting or light-headedness.  Unexplained weakness.  Shortness of breath. There are several types of monitors. Some common monitors include:  Holter monitor. This records your heart rhythm continuously, usually for 24-48 hours.  Event (episodic) monitor. This monitor has a symptoms button, and when pushed, it will begin recording. You need to activate this monitor to record when you have a heart-related symptom.  Automatic detection monitor. This monitor will begin recording when it detects an abnormal heartbeat. What are the risks? Generally, these devices are safe to use. However, it is possible that the skin under the electrodes will become irritated. How to prepare for monitoring Your health care  provider will prepare your chest for the electrode placement and show you how to use the monitor.  Do not apply lotions to your chest before monitoring.  Follow directions on how to care for the monitor, and how to return the monitor when the testing period is complete. How to use your cardiac monitor  Follow directions about how long to wear the monitor, and if you can take the monitor off in order to shower or bathe. ? Do not let the monitor get wet. ? Do not bathe, swim, or use a hot tub while wearing the monitor.  Keep your skin clean. Do not put body lotion or moisturizer on your chest.  Change the electrodes as told by your health care provider, or any time they stop sticking to your skin. You may need to use medical tape to keep them on.  Try to put the electrodes in slightly different places on your chest to help prevent skin irritation. Follow directions from your health care provider about where to place the electrodes.  Make sure the monitor is safely clipped to your clothing or in a location close to your body as recommended by your health care provider.  If your monitor has a symptoms button, press the button to mark an event as soon as you feel a heart-related symptom, such as: ? Dizziness. ? Weakness. ? Light-headedness. ? Palpitations. ? Thumping or pounding in  your chest. ? Shortness of breath. ? Unexplained weakness.  Keep a diary of your activities, such as walking, doing chores, and taking medicine. It is very important to note what you were doing when you pushed the button to record your symptoms. This will help your health care provider determine what might be contributing to your symptoms.  Send the recorded information as recommended by your health care provider. It may take some time for your health care provider to process the results.  Change the batteries as told by your health care provider.  Keep electronic devices away from your monitor. These include:  ? Tablets. ? MP3 players. ? Cell phones.  While wearing your monitor you should avoid: ? Electric blankets. ? Armed forces operational officer. ? Electric toothbrushes. ? Microwave ovens. ? Magnets. ? Metal detectors. Get help right away if:  You have chest pain.  You have shortness of breath or extreme difficulty breathing.  You develop a very fast heartbeat that does not get better.  You develop dizziness that does not go away.  You faint or constantly feel like you are about to faint. Summary  An ambulatory cardiac monitor is a small recording device that is used to detect abnormal heart rhythms (arrhythmias).  Make sure you understand how to send the information from the monitor to your health care provider.  It is important to press the button on the monitor when you have any heart-related symptoms.  Keep a diary of your activities, such as walking, doing chores, and taking medicine. It is very important to note what you were doing when you pushed the button to record your symptoms. This will help your health care provider learn what might be causing your symptoms. This information is not intended to replace advice given to you by your health care provider. Make sure you discuss any questions you have with your health care provider. Document Released: 09/11/2008 Document Revised: 11/15/2017 Document Reviewed: 11/17/2016 Elsevier Patient Education  2020 [place] Term Monitor Instructions   Your physician has requested you wear your ZIO patch monitor 14 days.   This is a single patch monitor.  [employer] supplies one patch monitor per enrollment.  Additional stickers are not available.   Please do not apply patch if you will be having a Nuclear Stress Test, Echocardiogram, Cardiac CT, MRI, or Chest Xray during the time frame you would be wearing the monitor. The patch cannot be worn during these tests.  You cannot remove and re-apply the ZIO XT patch monitor.   Your  ZIO patch monitor will be sent USPS Priority mail from [facility] directly to your home address. The monitor may also be mailed to a PO BOX if home delivery is not available.   It may take 3-5 days to receive your monitor after you have been enrolled.   Once you have received you monitor, please review enclosed instructions.  Your monitor has already been registered assigning a specific monitor serial # to you.   Applying the monitor   Shave hair from upper left chest.   Hold abrader disc by orange tab.  Rub abrader in 40 strokes over left upper chest as indicated in your monitor instructions.   Clean area with 4 enclosed alcohol pads .  Use all pads to assure are is cleaned thoroughly.  Let dry.   Apply patch as indicated in monitor instructions.  Patch will be place under collarbone on left side of chest with arrow pointing  upward.   Rub patch adhesive wings for 2 minutes.Remove white label marked "1".  Remove white label marked "2".  Rub patch adhesive wings for 2 additional minutes.   While looking in a mirror, press and release button in center of patch.  A small green light will flash 3-4 times .  This will be your only indicator the monitor has been turned on.     Do not shower for the first 24 hours.  You may shower after the first 24 hours.   Press button if you feel a symptom. You will hear a small click.  Record Date, Time and Symptom in the Patient Log Book.   When you are ready to remove patch, follow instructions on last 2 pages of Patient Log Book.  Stick patch monitor onto last page of Patient Log Book.   Place Patient Log Book in [place] and [place] box.  Use locking tab on box and tape box closed securely.  The [employer] and [employer] has [employer] on it.  Please place in mailbox as soon as possible.  Your physician should have your test results approximately 7 days after the monitor has been mailed back to [facility].   Call [place] at  [phone] if you have questions regarding your ZIO XT patch monitor.  Call them immediately if you see an orange light blinking on your monitor.   If your monitor falls off in less than 4 days contact our Monitor department at [phone].  If your monitor becomes loose or falls off after 4 days call [employer] at [phone] for suggestions on securing your monitor.

## 2019-11-27 ENCOUNTER — Ambulatory Visit (HOSPITAL_COMMUNITY): Payer: Medicare HMO | Attending: Interventional Cardiology

## 2019-11-27 ENCOUNTER — Other Ambulatory Visit: Payer: Self-pay

## 2019-11-27 DIAGNOSIS — R002 Palpitations: Secondary | ICD-10-CM

## 2019-11-27 DIAGNOSIS — E785 Hyperlipidemia, unspecified: Secondary | ICD-10-CM | POA: Insufficient documentation

## 2019-11-27 DIAGNOSIS — R0602 Shortness of breath: Secondary | ICD-10-CM | POA: Insufficient documentation

## 2019-11-27 DIAGNOSIS — R079 Chest pain, unspecified: Secondary | ICD-10-CM | POA: Diagnosis not present

## 2019-11-27 DIAGNOSIS — I119 Hypertensive heart disease without heart failure: Secondary | ICD-10-CM | POA: Diagnosis not present

## 2019-11-27 DIAGNOSIS — I083 Combined rheumatic disorders of mitral, aortic and tricuspid valves: Secondary | ICD-10-CM | POA: Insufficient documentation

## 2019-11-27 DIAGNOSIS — I313 Pericardial effusion (noninflammatory): Secondary | ICD-10-CM | POA: Insufficient documentation

## 2019-11-27 DIAGNOSIS — E119 Type 2 diabetes mellitus without complications: Secondary | ICD-10-CM | POA: Insufficient documentation

## 2019-11-27 DIAGNOSIS — E669 Obesity, unspecified: Secondary | ICD-10-CM | POA: Diagnosis not present

## 2019-12-06 ENCOUNTER — Other Ambulatory Visit (INDEPENDENT_AMBULATORY_CARE_PROVIDER_SITE_OTHER): Payer: Medicare HMO

## 2019-12-06 DIAGNOSIS — R002 Palpitations: Secondary | ICD-10-CM

## 2019-12-15 DIAGNOSIS — E1165 Type 2 diabetes mellitus with hyperglycemia: Secondary | ICD-10-CM | POA: Diagnosis not present

## 2019-12-15 DIAGNOSIS — E039 Hypothyroidism, unspecified: Secondary | ICD-10-CM | POA: Diagnosis not present

## 2019-12-15 DIAGNOSIS — E785 Hyperlipidemia, unspecified: Secondary | ICD-10-CM | POA: Diagnosis not present

## 2019-12-15 DIAGNOSIS — E782 Mixed hyperlipidemia: Secondary | ICD-10-CM | POA: Diagnosis not present

## 2019-12-15 DIAGNOSIS — N189 Chronic kidney disease, unspecified: Secondary | ICD-10-CM | POA: Diagnosis not present

## 2019-12-15 DIAGNOSIS — E1129 Type 2 diabetes mellitus with other diabetic kidney complication: Secondary | ICD-10-CM | POA: Diagnosis not present

## 2019-12-15 DIAGNOSIS — I10 Essential (primary) hypertension: Secondary | ICD-10-CM | POA: Diagnosis not present

## 2019-12-15 DIAGNOSIS — E1122 Type 2 diabetes mellitus with diabetic chronic kidney disease: Secondary | ICD-10-CM | POA: Diagnosis not present

## 2019-12-18 DIAGNOSIS — C541 Malignant neoplasm of endometrium: Secondary | ICD-10-CM

## 2019-12-18 DIAGNOSIS — C801 Malignant (primary) neoplasm, unspecified: Secondary | ICD-10-CM

## 2019-12-18 HISTORY — DX: Malignant neoplasm of endometrium: C54.1

## 2019-12-18 HISTORY — DX: Malignant (primary) neoplasm, unspecified: C80.1

## 2019-12-21 ENCOUNTER — Ambulatory Visit
Admission: RE | Admit: 2019-12-21 | Discharge: 2019-12-21 | Disposition: A | Discharge status: Home / Self Care | Payer: Medicare HMO | Source: Ambulatory Visit | Attending: Neurology | Admitting: Neurology

## 2019-12-21 ENCOUNTER — Other Ambulatory Visit: Payer: Self-pay

## 2019-12-21 DIAGNOSIS — R202 Paresthesia of skin: Secondary | ICD-10-CM

## 2019-12-21 DIAGNOSIS — M5412 Radiculopathy, cervical region: Secondary | ICD-10-CM

## 2019-12-21 DIAGNOSIS — R29898 Other symptoms and signs involving the musculoskeletal system: Secondary | ICD-10-CM

## 2019-12-24 ENCOUNTER — Telehealth: Payer: Self-pay | Admitting: *Deleted

## 2019-12-24 DIAGNOSIS — M5412 Radiculopathy, cervical region: Secondary | ICD-10-CM

## 2019-12-24 NOTE — Telephone Encounter (Signed)
Spoke with patient and informed her of Dr. Cathren Laine message. Pt said she had some names written down and she would call back next week with them. She verbalized appreciation for the call.

## 2019-12-24 NOTE — Telephone Encounter (Signed)
Spoke with Dr. Jaynee Eagles. No medications recommended for calcification.

## 2019-12-24 NOTE — Telephone Encounter (Addendum)
Spoke with pt and discussed the results of CT c-spine. Pt understands and is agreeable to being referred to [place] [facility] for evaluation. She was given their office number if needed. Pt also asked for an update on the referral to [facility]. She asked that her daughter Earlie Server NOT be called d/t her mental illness. Pt asking also if there are medications she can take for the brain calcification, as she has been reading up on some that are out there. Pt requests a call back. She verbalized appreciation for the call.

## 2019-12-24 NOTE — Telephone Encounter (Signed)
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 If she likes we can refer her to a surgeon for evaluation or injections into the spine to see if she improves. Let me know and I can place orders thank you.

## 2019-12-29 ENCOUNTER — Emergency Department (HOSPITAL_COMMUNITY)
Admission: EM | Admit: 2019-12-29 | Discharge: 2019-12-30 | Disposition: A | Discharge status: Home / Self Care | Payer: Medicare HMO | Attending: Emergency Medicine | Admitting: Emergency Medicine

## 2019-12-29 ENCOUNTER — Encounter (HOSPITAL_COMMUNITY): Payer: Self-pay | Admitting: Emergency Medicine

## 2019-12-29 ENCOUNTER — Emergency Department (HOSPITAL_COMMUNITY): Payer: Medicare HMO

## 2019-12-29 DIAGNOSIS — E119 Type 2 diabetes mellitus without complications: Secondary | ICD-10-CM | POA: Diagnosis not present

## 2019-12-29 DIAGNOSIS — R519 Headache, unspecified: Secondary | ICD-10-CM

## 2019-12-29 DIAGNOSIS — E039 Hypothyroidism, unspecified: Secondary | ICD-10-CM | POA: Insufficient documentation

## 2019-12-29 DIAGNOSIS — I10 Essential (primary) hypertension: Secondary | ICD-10-CM | POA: Insufficient documentation

## 2019-12-29 DIAGNOSIS — G43909 Migraine, unspecified, not intractable, without status migrainosus: Secondary | ICD-10-CM | POA: Diagnosis not present

## 2019-12-29 DIAGNOSIS — J45909 Unspecified asthma, uncomplicated: Secondary | ICD-10-CM | POA: Insufficient documentation

## 2019-12-29 DIAGNOSIS — G44309 Post-traumatic headache, unspecified, not intractable: Secondary | ICD-10-CM | POA: Insufficient documentation

## 2019-12-29 DIAGNOSIS — R079 Chest pain, unspecified: Secondary | ICD-10-CM | POA: Diagnosis not present

## 2019-12-29 DIAGNOSIS — S199XXA Unspecified injury of neck, initial encounter: Secondary | ICD-10-CM | POA: Diagnosis not present

## 2019-12-29 DIAGNOSIS — R000 Tachycardia, unspecified: Secondary | ICD-10-CM | POA: Diagnosis not present

## 2019-12-29 LAB — BASIC METABOLIC PANEL
Anion gap: 10 (ref 5–15)
BUN: 10 mg/dL (ref 8–23)
CO2: 28 mmol/L (ref 22–32)
Calcium: 9.6 mg/dL (ref 8.9–10.3)
Chloride: 103 mmol/L (ref 98–111)
Creatinine, Ser: 0.74 mg/dL (ref 0.44–1.00)
GFR calc Af Amer: >60 mL/min (ref >60)
GFR calc non Af Amer: >60 mL/min (ref >60)
Glucose, Bld: 184 mg/dL — ABNORMAL HIGH (ref 70–99)
Potassium: 3.6 mmol/L (ref 3.5–5.1)
Sodium: 141 mmol/L (ref 135–145)

## 2019-12-29 LAB — CBC
HCT: 40.9 % (ref 36.0–46.0)
Hemoglobin: 13.4 g/dL (ref 12.0–15.0)
MCH: 29.7 pg (ref 26.0–34.0)
MCHC: 32.8 g/dL (ref 30.0–36.0)
MCV: 90.7 fL (ref 80.0–100.0)
Platelets: 223 10*3/uL (ref 150–400)
RBC: 4.51 MIL/uL (ref 3.87–5.11)
RDW: 13.3 % (ref 11.5–15.5)
WBC: 4.2 10*3/uL (ref 4.0–10.5)
nRBC: 0 % (ref 0.0–0.2)

## 2019-12-29 LAB — TROPONIN I (HIGH SENSITIVITY)
Troponin I (High Sensitivity): 5 ng/L (ref <18)
Troponin I (High Sensitivity): 5 ng/L (ref <18)

## 2019-12-29 MED ORDER — SODIUM CHLORIDE 0.9% FLUSH: 3.0000 mL | Freq: Once | INTRAVENOUS | Status: DC

## 2019-12-29 NOTE — Telephone Encounter (Signed)
Patient apt is 09/06/2020 patient will be seeing Dr. Hall Busing at [facility] . Patient is aware.
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
## 2019-12-30 ENCOUNTER — Other Ambulatory Visit: Payer: Self-pay

## 2019-12-30 DIAGNOSIS — G44309 Post-traumatic headache, unspecified, not intractable: Secondary | ICD-10-CM | POA: Diagnosis not present

## 2019-12-30 MED ORDER — DEXAMETHASONE SODIUM PHOSPHATE 10 MG/ML IJ SOLN
10.0000 mg | Freq: Once | INTRAMUSCULAR | Status: AC
  Administered 2019-12-30: 10 mg via INTRAVENOUS
  Filled 2019-12-30: qty 1

## 2019-12-30 MED ORDER — PROCHLORPERAZINE EDISYLATE 10 MG/2ML IJ SOLN
10.0000 mg | Freq: Once | INTRAMUSCULAR | Status: AC
  Administered 2019-12-30: 10 mg via INTRAVENOUS
  Filled 2019-12-30: qty 2

## 2019-12-30 MED ORDER — DIPHENHYDRAMINE HCL 50 MG/ML IJ SOLN
25.0000 mg | Freq: Once | INTRAMUSCULAR | Status: AC
  Administered 2019-12-30: 25 mg via INTRAVENOUS
  Filled 2019-12-30: qty 1

## 2019-12-30 MED ORDER — LACTATED RINGERS IV BOLUS
1000.0000 mL | Freq: Once | INTRAVENOUS | Status: AC
  Administered 2019-12-30: 1000 mL via INTRAVENOUS

## 2019-12-30 NOTE — ED Notes (Signed)
Medications given and charted per [facility]. Tolerated well. Given warm blanket.
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
## 2019-12-30 NOTE — Discharge Instructions (Addendum)
Follow up with your neurologist.

## 2020-01-05 ENCOUNTER — Other Ambulatory Visit: Payer: Self-pay

## 2020-01-05 ENCOUNTER — Ambulatory Visit (INDEPENDENT_AMBULATORY_CARE_PROVIDER_SITE_OTHER): Payer: Medicare HMO | Admitting: Neurology

## 2020-01-05 DIAGNOSIS — R519 Headache, unspecified: Secondary | ICD-10-CM | POA: Diagnosis not present

## 2020-01-05 MED ORDER — GABAPENTIN 100 MG PO CAPS: 100.0000 mg | ORAL_CAPSULE | Freq: Three times a day (TID) | ORAL | 6 refills | Status: DC
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
## 2020-01-06 NOTE — Progress Notes (Deleted)
Cardiology Office Note   Date:  01/06/2020   ID:  Rumor, Sirman 04/18/1951, MRN [id number]  PCP:  Hulan Fess, MD    No chief complaint on file.    Wt Readings from Last 3 Encounters:  12/30/19 176 lb (79.8 kg)  11/26/19 179 lb 12.8 oz (81.6 kg)  09/29/19 173 lb (78.5 kg)       History of Present Illness: [name] is a 69 y.o. female  who I fiorst saw in 11/2019 for the evaluation of irregular heart beat at the request of Little, Lennette Bihari, MD.  She has had a fall, and brain calcification.  SHe was worked up for Parkinsons, due to fall and tremor. SHe is seeing neurology.  Of late, she has ben retaining some fluid in her legs, starting in August of 2020 approximately.  SHe has been on furosemide without much relief.    She noted that her BP cuff was giving her a "heart symbol."  SHe was feeling palpitations, sometimes in the early morning.  They happen daily.  No syncope.  Palpitations are daily.   She has developed allergies to BP and DM meds.  She reports some chest pains.  These started after the palpitations.  THey occur randomly.  They are dull.  No diagnosis of CAD.  She has not had a stress test.  In Dec 2020: "Based on EF, would consider further ischemic testing. She will need high dose of beta blocker for CT scan.  CP is atypical, nonexertional and she has not had CAD diagnosed in the past.  Prior cerebral angio in 2010 showed only minimal plaque. "  Echo in 11/2019 showed normal LVEF and adequate valvular function.   Monitor was also ordered.  Regarding Fluid retention: "Likely venous insufficiency.  Elevate legs with ankles above the level of her heart.  Also, she could try compression stockings, 15-20 mm Hg."  Past Medical History:  Diagnosis Date  . Allergy   . Asthma    Dianosed years ago-no meds at this time  . Cataract    Right eye removed-still has left cataract   . Chronic kidney disease 1997   Nepthrotic syndrom with  minimal change-in remission per pt   . Diabetes mellitus   . Dysrhythmia    "skips beats"  . Fibromyalgia 1980's  . Generalized anxiety disorder   . GERD (gastroesophageal reflux disease)    occ- will use Tums and Prolosec  prn   . Hyperlipemia   . Hypertension   . Hypothyroidism   . Irritable bowel syndrome 1980's  . Major depressive disorder   . Migraine headaches 1980's   On meds-well controlled  . 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  . Morbid obesity ([place])   . Pneumonia 2009   Several times over the past several years  . PONV (postoperative nausea and vomiting)   . Recurrent upper respiratory infection (URI)   . Sleep apnea    no cpap  . Subarachnoid hemorrhage ([place]) 2010  . Vaginal polyp    benign per pt    Past Surgical History:  Procedure Laterality Date  . CHOLECYSTECTOMY  1983  . COLONOSCOPY  2008   DB  . [place] OF UTERUS  1972  . HYSTEROSCOPY WITH D & C  06/29/2011   Procedure: DILATATION AND CURETTAGE (D&C) /HYSTEROSCOPY;  Surgeon: Donnamae Jude, MD;  Location: [facility];  Service: Gynecology;  Laterality: N/A;     Current Outpatient Medications  Medication Sig  Dispense Refill  . ACCU-CHEK AVIVA PLUS test strip     . ACCU-CHEK SOFTCLIX LANCETS lancets     . aspirin 325 MG tablet Take 325 mg by mouth at bedtime.    . Ergocalciferol (VITAMIN D2) 2000 UNITS TABS Take 2,000 Units by mouth daily.    . furosemide (LASIX) 20 MG tablet Take 20 mg by mouth daily.    Marland Kitchen gabapentin (NEURONTIN) 100 MG capsule Take 1 capsule (100 mg total) by mouth 3 (three) times daily. 90 capsule 6  . levothyroxine (SYNTHROID, LEVOTHROID) 50 MCG tablet Take 50 mcg by mouth daily.     Marland Kitchen OZEMPIC, 0.25 OR 0.5 MG/DOSE, 2 MG/1.5ML SOPN      Current Facility-Administered Medications  Medication Dose Route Frequency Provider Last Rate Last Admin  . 0.9 %  sodium chloride infusion  500 mL Intravenous Once Nandigam, Kavitha V, MD        Allergies:   Acarbose, Atenolol,  Atorvastatin, Cinnamon, Crestor [rosuvastatin calcium], Glimepiride, Invokana [canagliflozin], Januvia [sitagliptin], Losartan potassium, Metformin and related, Pravastatin, Sulfamethoxazole, and Sulfa antibiotics    Social History:  The patient  reports that she has never smoked. She has never used smokeless tobacco. She reports that she does not drink alcohol or use drugs.   Family History:  The patient's ***family history includes Anesthesia problems in her granddaughter, mother, and son; Arthritis in an other family member; Bladder Cancer in her father; Colon cancer in her father; Diabetes in an other family member; High blood pressure in an other family member; Liver cancer in her father; Schizophrenia in her daughter; Tremor in her son.    ROS:  Please see the history of present illness.   Otherwise, review of systems are positive for ***.   All other systems are reviewed and negative.    PHYSICAL EXAM: VS:  There were no vitals taken for this visit. , BMI There is no height or weight on file to calculate BMI. GEN: Well nourished, well developed, in no acute distress  HEENT: normal  Neck: no JVD, carotid bruits, or masses Cardiac: ***RRR; no murmurs, rubs, or gallops,no edema  Respiratory:  clear to auscultation bilaterally, normal work of breathing GI: soft, nontender, nondistended, + BS MS: no deformity or atrophy  Skin: warm and dry, no rash Neuro:  Strength and sensation are intact Psych: euthymic mood, full affect   EKG:   The ekg ordered today demonstrates ***   Recent Labs: 12/29/2019: BUN 10; Creatinine, Ser 0.74; Hemoglobin 13.4; Platelets 223; Potassium 3.6; Sodium 141   Lipid Panel No results found for: CHOL, TRIG, HDL, CHOLHDL, VLDL, LDLCALC, LDLDIRECT   Other studies Reviewed: Additional studies/ records that were reviewed today with results demonstrating: ***.   ASSESSMENT AND PLAN:  1. Palpitations:  2. Chest pain: 3. DM: 4.    Current medicines are  reviewed at length with the patient today.  The patient concerns regarding her medicines were addressed.  The following changes have been made:  No change***  Labs/ tests ordered today include: *** No orders of the defined types were placed in this encounter.   Recommend 150 minutes/week of aerobic exercise Low fat, low carb, high fiber diet recommended  Disposition:   FU in ***   Signed, Larae Grooms, MD  01/06/2020 10:38 AM    [facility] [place], [place], [place]  [address] Phone: [phone]; Fax: [phone]

## 2020-01-07 ENCOUNTER — Ambulatory Visit: Payer: Medicare HMO | Admitting: Interventional Cardiology

## 2020-01-07 DIAGNOSIS — R002 Palpitations: Secondary | ICD-10-CM | POA: Diagnosis not present

## 2020-01-10 ENCOUNTER — Encounter: Payer: Self-pay | Admitting: Neurology

## 2020-01-14 DIAGNOSIS — E1122 Type 2 diabetes mellitus with diabetic chronic kidney disease: Secondary | ICD-10-CM | POA: Diagnosis not present

## 2020-01-14 DIAGNOSIS — E1165 Type 2 diabetes mellitus with hyperglycemia: Secondary | ICD-10-CM | POA: Diagnosis not present

## 2020-01-14 DIAGNOSIS — E785 Hyperlipidemia, unspecified: Secondary | ICD-10-CM | POA: Diagnosis not present

## 2020-01-14 DIAGNOSIS — E1129 Type 2 diabetes mellitus with other diabetic kidney complication: Secondary | ICD-10-CM | POA: Diagnosis not present

## 2020-01-14 DIAGNOSIS — I10 Essential (primary) hypertension: Secondary | ICD-10-CM | POA: Diagnosis not present

## 2020-01-14 DIAGNOSIS — E1169 Type 2 diabetes mellitus with other specified complication: Secondary | ICD-10-CM | POA: Diagnosis not present

## 2020-01-14 DIAGNOSIS — N189 Chronic kidney disease, unspecified: Secondary | ICD-10-CM | POA: Diagnosis not present

## 2020-01-14 DIAGNOSIS — E782 Mixed hyperlipidemia: Secondary | ICD-10-CM | POA: Diagnosis not present

## 2020-01-14 DIAGNOSIS — E039 Hypothyroidism, unspecified: Secondary | ICD-10-CM | POA: Diagnosis not present

## 2020-01-18 ENCOUNTER — Encounter: Payer: Self-pay | Admitting: Gastroenterology

## 2020-01-18 ENCOUNTER — Ambulatory Visit (INDEPENDENT_AMBULATORY_CARE_PROVIDER_SITE_OTHER): Payer: Medicare HMO | Admitting: Gastroenterology

## 2020-01-18 ENCOUNTER — Other Ambulatory Visit: Payer: Self-pay

## 2020-01-18 VITALS — BP 126/68 | HR 99 | Temp 97.6°F | Ht 62.5 in | Wt 178.0 lb

## 2020-01-18 DIAGNOSIS — R197 Diarrhea, unspecified: Secondary | ICD-10-CM | POA: Diagnosis not present

## 2020-01-18 DIAGNOSIS — E739 Lactose intolerance, unspecified: Secondary | ICD-10-CM | POA: Diagnosis not present

## 2020-01-18 DIAGNOSIS — R159 Full incontinence of feces: Secondary | ICD-10-CM

## 2020-01-18 NOTE — Progress Notes (Signed)
[name]    LM:[id number]    12/28/1950  Primary Care Physician:Little, Lennette Bihari, MD  Referring Physician: Hulan Fess, MD [place],   [id number]   Chief complaint: Diarrhea, fecal incontinence HPI: 69 year old female here for follow-up visit for fecal incontinence and intermittent diarrhea.  She has been having progressive worsening of neurologic problem, has extensive calcifications in her brain and think her health is deteriorating because of that. She has not been feeling well in the past 10 months, has weakness, paresthesia, vision changes and symptoms similar to Parkinson's She also has been having fluid retention, normal LVEF. Also had loop recorder, unremakable  She had nephrotic syndrome in 1990's was treated with Prednisone. Currently on diuretics, seems to help with fluid retention.  Reviewed recent labs with normal kidney function.  She was having 4-5 liquid bowel movements previously but since she started taking Benefiber she is having 2 semiformed bowel movements but continues to have episodes of fecal incontinence.  Denies any blood per rectum or melena.  No abdominal pain, nausea, vomiting, change in appetite or weight loss.  She has gained weight mostly from fluid retention per patient  Colonoscopy June 03, 2018: 2 sessile polyps removed from transverse and descending colon, one of which was a tubular adenoma.  Colonic diverticulosis and internal hemorrhoids.  She is waiting for appointment to be seen at [facility] for evaluation for [facility]  Outpatient Encounter Medications as of 01/18/2020  Medication Sig  . ACCU-CHEK AVIVA PLUS test strip   . ACCU-CHEK SOFTCLIX LANCETS lancets   . aspirin 325 MG tablet Take 325 mg by mouth at bedtime.  . bumetanide (BUMEX) 0.5 MG tablet Take 0.5 mg by mouth every morning.  . Ergocalciferol (VITAMIN D2) 2000 UNITS TABS Take 2,000 Units by mouth daily.  Marland Kitchen gabapentin  (NEURONTIN) 100 MG capsule Take 1 capsule (100 mg total) by mouth 3 (three) times daily.  Marland Kitchen levothyroxine (SYNTHROID, LEVOTHROID) 50 MCG tablet Take 50 mcg by mouth daily.   . [DISCONTINUED] furosemide (LASIX) 20 MG tablet Take 20 mg by mouth daily.  . [DISCONTINUED] OZEMPIC, 0.25 OR 0.5 MG/DOSE, 2 MG/1.5ML SOPN    Facility-Administered Encounter Medications as of 01/18/2020  Medication  . 0.9 %  sodium chloride infusion    Allergies as of 01/18/2020 - Review Complete 01/18/2020  Allergen Reaction Noted  . Acarbose  05/20/2018  . Atenolol  09/29/2019  . Atorvastatin  05/20/2018  . Cinnamon  06/15/2015  . Crestor [rosuvastatin calcium]  05/20/2018  . Glimepiride  05/20/2018  . Invokana [canagliflozin]  05/20/2018  . Januvia [sitagliptin]  05/20/2018  . Losartan potassium  05/20/2018  . Metformin and related Nausea And Vomiting and Other (See Comments) 06/26/2011  . Pravastatin  05/20/2018  . Sulfamethoxazole Other (See Comments) 03/24/2007  . Sulfa antibiotics Rash and Other (See Comments) 06/16/2011    Past Medical History:  Diagnosis Date  . Allergy   . Asthma    Dianosed years ago-no meds at this time  . Cataract    Right eye removed-still has left cataract   . 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 Diabetes mellitus   . Dysrhythmia    "skips beats"  . Fibromyalgia 1980's  . Generalized anxiety disorder   . GERD (gastroesophageal reflux disease)    occ- will use Tums and Prolosec  prn   . Hyperlipemia   . Hypertension   . Hypothyroidism   . Irritable bowel syndrome 1980's  . Major depressive disorder   . Migraine headaches 1980's   On meds-well controlled  . 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  . Morbid obesity ([place])   . Pneumonia 2009   Several times over the past several years  . PONV (postoperative nausea and vomiting)   . Recurrent upper respiratory infection (URI)   . Sleep apnea    no cpap  .  Subarachnoid hemorrhage ([place]) 2010  . Vaginal polyp    benign per pt    Past Surgical History:  Procedure Laterality Date  . CHOLECYSTECTOMY  1983  . COLONOSCOPY  2008   DB  . [place] OF UTERUS  1972  . HYSTEROSCOPY WITH D & C  06/29/2011   Procedure: DILATATION AND CURETTAGE (D&C) /HYSTEROSCOPY;  Surgeon: Donnamae Jude, MD;  Location: [facility];  Service: Gynecology;  Laterality: N/A;    Family History  Problem Relation Age of Onset  . Liver cancer Father        mets to liver   . Bladder Cancer Father   . Colon cancer Father        mets to colon   . Anesthesia problems Mother        hard to wake post op   . High blood pressure Other   . Diabetes Other   . Arthritis Other   . Anesthesia problems Son        stopped breathing post op   . Tremor Son   . Schizophrenia Daughter        Schizoaffective disorder  . Anesthesia problems Granddaughter        PONV  . Dementia Neg Hx   . Esophageal cancer Neg Hx   . Rectal cancer Neg Hx   . Stomach cancer Neg Hx   . Colon polyps Neg Hx     Social History   Socioeconomic History  . Marital status: Legally Separated    Spouse name: Not on file  . Number of children: 3  . Years of education: 69  . Highest education level: Not on file  Occupational History  . Occupation: Retired   Tobacco Use  . Smoking status: Never Smoker  . Smokeless tobacco: Never Used  Substance and Sexual Activity  . Alcohol use: No    Comment: quit 1978  . Drug use: No  . Sexual activity: Not Currently  Other Topics Concern  . Not on file  Social History Narrative   Lives at home. Her granddaughter lives with her.    Right handed   Social Determinants of Health   Financial Resource Strain:   . Difficulty of Paying Living Expenses: Not on file  Food Insecurity:   . Worried About Charity fundraiser in the Last Year: Not on file  . Ran Out of Food in the Last Year: Not on file  Transportation Needs:   . Lack of Transportation  (Medical): Not on file  . Lack of Transportation (Non-Medical): Not on file  Physical Activity:   . Days of Exercise per Week: Not on file  . Minutes of Exercise per Session: Not on file  Stress:   . Feeling of Stress : Not on file  Social Connections:   . Frequency of Communication with Friends and Family: Not on file  . Frequency of Social Gatherings with Friends and Family: Not on file  . Attends  Religious Services: Not on file  . Active Member of Clubs or Organizations: Not on file  . Attends Archivist Meetings: Not on file  . Marital Status: Not on file  Intimate Partner Violence:   . Fear of Current or Ex-Partner: Not on file  . Emotionally Abused: Not on file  . Physically Abused: Not on file  . Sexually Abused: Not on file      Review of systems:  All other review of systems negative except as mentioned in the HPI.   Physical Exam: Vitals:   01/18/20 1301  Temp: 97.6 F (36.4 C)   Body mass index is 32.04 kg/m.  Data Reviewed:  Reviewed labs, radiology imaging, old records and pertinent past GI work up   Assessment and Plan/Recommendations:  69 year old female with chronic neurological problems, brain calcification with progressive worsening here with complaints of fecal incontinence  Unclear if her neurological symptoms are exacerbating fecal incontinence  Advised patient to start taking Benefiber 1 tablespoon 3 times daily  Empirical trial of lactose-free diet for 2 weeks, possible lactose intolerance worsening diarrhea symptoms  Kegel exercises to improve anal sphincter tone, will refer to pelvic floor physical therapy to improve anal sphincter and pelvic floor function  Patient is awaiting further evaluation at [facility] for brain calcifications, Parkinson's symptoms and abnormal brain MRI, ?FA[facility]RS  Continues to have persistent episodes of fecal incontinence, will consider anorectal manometry for further evaluation and  referral to colorectal surgery for possible InterStim  Return in 6 to 8 weeks  This visit required 30 minutes of patient care (this includes precharting, chart review, review of results, face-to-face time used for counseling as well as treatment plan and follow-up. The patient was provided an opportunity to ask questions and all were answered. The patient agreed with the plan and demonstrated an understanding of the instructions.  Damaris Hippo , MD    CC: Hulan Fess, MD

## 2020-01-18 NOTE — Patient Instructions (Addendum)
If you are age 71 or older, your body mass index should be between 23-30. Your Body mass index is 32.04 kg/m. If this is out of the aforementioned range listed, please consider follow up with your Primary Care Provider.  If you are age 8 or younger, your body mass index should be between 19-25. Your Body mass index is 32.04 kg/m. If this is out of the aformentioned range listed, please consider follow up with your Primary Care Provider.    Take Benefiber 1 tablespoon three times a day  OK to use Lactaid milk   Kegel Exercises  Kegel exercises can help strengthen your pelvic floor muscles. The pelvic floor is a group of muscles that support your rectum, small intestine, and bladder. In females, pelvic floor muscles also help support the womb (uterus). These muscles help you control the flow of urine and stool. Kegel exercises are painless and simple, and they do not require any equipment. Your provider may suggest Kegel exercises to:  Improve bladder and bowel control.  Improve sexual response.  Improve weak pelvic floor muscles after surgery to remove the uterus (hysterectomy) or pregnancy (females).  Improve weak pelvic floor muscles after prostate gland removal or surgery (males). Kegel exercises involve squeezing your pelvic floor muscles, which are the same muscles you squeeze when you try to stop the flow of urine or keep from passing gas. The exercises can be done while sitting, standing, or lying down, but it is best to vary your position. Exercises How to do Kegel exercises: 1. Squeeze your pelvic floor muscles tight. You should feel a tight lift in your rectal area. If you are a female, you should also feel a tightness in your vaginal area. Keep your stomach, buttocks, and legs relaxed. 2. Hold the muscles tight for up to 10 seconds. 3. Breathe normally. 4. Relax your muscles. 5. Repeat as told by your health care provider. Repeat this exercise daily as told by your health  care provider. Continue to do this exercise for at least 4-6 weeks, or for as long as told by your health care provider. You may be referred to a physical therapist who can help you learn more about how to do Kegel exercises. Depending on your condition, your health care provider may recommend:  Varying how long you squeeze your muscles.  Doing several sets of exercises every day.  Doing exercises for several weeks.  Making Kegel exercises a part of your regular exercise routine. This information is not intended to replace advice given to you by your health care provider. Make sure you discuss any questions you have with your health care provider. Document Revised: 07/23/2018 Document Reviewed: 07/23/2018 Elsevier Patient Education  [place].    Lactose-Free Diet, Adult If you have lactose intolerance, you are not able to digest lactose. Lactose is a natural sugar found mainly in dairy milk and dairy products. You may need to avoid all foods and beverages that contain lactose. A lactose-free diet can help you do this. Which foods have lactose? Lactose is found in dairy milk and dairy products, such as:  Yogurt.  Cheese.  Butter.  Margarine.  Sour cream.  Cream.  Whipped toppings and nondairy creamers.  Ice cream and other dairy-based desserts. Lactose is also found in foods or products made with dairy milk or milk ingredients. To find out whether a food contains dairy milk or a milk ingredient, look at the ingredients list. Avoid foods with the statement "May contain milk" and  foods that contain:  Milk powder.  Whey.  Curd.  Caseinate.  Lactose.  Lactalbumin.  Lactoglobulin. What are alternatives to dairy milk and foods made with milk products?  Lactose-free milk.  Soy milk with added calcium and vitamin D.  Almond milk, coconut milk, rice milk, or other nondairy milk alternatives with added calcium and vitamin D. Note that these are low in  protein.  Soy products, such as soy yogurt, soy cheese, soy ice cream, and soy-based sour cream.  Other nut milk products, such as almond yogurt, almond cheese, cashew yogurt, cashew cheese, cashew ice cream, coconut yogurt, and coconut ice cream. What are tips for following this plan?  Do not consume foods, beverages, vitamins, minerals, or medicines containing lactose. Read ingredient lists carefully.  Look for the words "lactose-free" on labels.  Use lactase enzyme drops or tablets as directed by your health care provider.  Use lactose-free milk or a milk alternative, such as soy milk or almond milk, for drinking and cooking.  Make sure you get enough calcium and vitamin D in your diet. A lactose-free eating plan can be lacking in these important nutrients.  Take calcium and vitamin D supplements as directed by your health care provider. Talk to your health care provider about supplements if you are not able to get enough calcium and vitamin D from food. What foods can I eat?  Fruits All fresh, canned, frozen, or dried fruits that are not processed with lactose. Vegetables All fresh, frozen, and canned vegetables without cheese, cream, or butter sauces. Grains Any that are not made with dairy milk or dairy products. Meats and other proteins Any meat, fish, poultry, and other protein sources that are not made with dairy milk or dairy products. Soy cheese and yogurt. Fats and oils Any that are not made with dairy milk or dairy products. Beverages Lactose-free milk. Soy, rice, or almond milk with added calcium and vitamin D. Fruit and vegetable juices. Sweets and desserts Any that are not made with dairy milk or dairy products. Seasonings and condiments Any that are not made with dairy milk or dairy products. Calcium Calcium is found in many foods that contain lactose and is important for bone health. The amount of calcium you need depends on your age:  Adults younger than 50  years: 1,000 mg of calcium a day.  Adults older than 50 years: 1,200 mg of calcium a day. If you are not getting enough calcium, you may get it from other sources, including:  Orange juice with calcium added. There are 300-350 mg of calcium in 1 cup of orange juice.  Calcium-fortified soy milk. There are 300-400 mg of calcium in 1 cup of calcium-fortified soy milk.  Calcium-fortified rice or almond milk. There are 300 mg of calcium in 1 cup of calcium-fortified rice or almond milk.  Calcium-fortified breakfast cereals. There are 100-1,000 mg of calcium in calcium-fortified breakfast cereals.  Spinach, cooked. There are 145 mg of calcium in  cup of cooked spinach.  Edamame, cooked. There are 130 mg of calcium in  cup of cooked edamame.  Collard greens, cooked. There are 125 mg of calcium in  cup of cooked collard greens.  Kale, frozen or cooked. There are 90 mg of calcium in  cup of cooked or frozen kale.  Almonds. There are 95 mg of calcium in  cup of almonds.  Broccoli, cooked. There are 60 mg of calcium in 1 cup of cooked broccoli. The items listed above may not be  a complete list of recommended foods and beverages. Contact a dietitian for more options. What foods are not recommended? Fruits None, unless they are made with dairy milk or dairy products. Vegetables None, unless they are made with dairy milk or dairy products. Grains Any grains that are made with dairy milk or dairy products. Meats and other proteins None, unless they are made with dairy milk or dairy products. Dairy All dairy products, including milk, goat's milk, buttermilk, kefir, acidophilus milk, flavored milk, evaporated milk, condensed milk, dulce de [place], eggnog, yogurt, cheese, and cheese spreads. Fats and oils Any that are made with milk or milk products. Margarines and salad dressings that contain milk or cheese. Cream. Half and half. Cream cheese. Sour cream. Chip dips made with sour cream or  yogurt. Beverages Hot chocolate. Cocoa with lactose. Instant iced teas. Powdered fruit drinks. Smoothies made with dairy milk or yogurt. Sweets and desserts Any that are made with milk or milk products. Seasonings and condiments Chewing gum that has lactose. Spice blends if they contain lactose. Artificial sweeteners that contain lactose. Nondairy creamers. The items listed above may not be a complete list of foods and beverages to avoid. Contact a dietitian for more information. Summary  If you are lactose intolerant, it means that you have a hard time digesting lactose, a natural sugar found in milk and milk products.  Following a lactose-free diet can help you manage this condition.  Calcium is important for bone health and is found in many foods that contain lactose. Talk with your health care provider about other sources of calcium. This information is not intended to replace advice given to you by your health care provider. Make sure you discuss any questions you have with your health care provider. Document Revised: 12/31/2017 Document Reviewed: 12/31/2017 Elsevier Patient Education  [place].   We will refer you to Pelvic Floor PT and they will contact you with that appointment  I appreciate the  opportunity to care for you  Thank You   Harl Bowie , MD

## 2020-01-27 ENCOUNTER — Ambulatory Visit: Payer: Medicare HMO | Admitting: Podiatry

## 2020-01-28 ENCOUNTER — Other Ambulatory Visit: Payer: Self-pay

## 2020-01-28 ENCOUNTER — Ambulatory Visit: Payer: Medicare HMO | Attending: Gastroenterology | Admitting: Physical Therapy

## 2020-01-28 ENCOUNTER — Encounter: Payer: Self-pay | Admitting: Physical Therapy

## 2020-01-28 ENCOUNTER — Ambulatory Visit: Payer: Medicare HMO | Admitting: Interventional Cardiology

## 2020-01-28 DIAGNOSIS — R278 Other lack of coordination: Secondary | ICD-10-CM | POA: Insufficient documentation

## 2020-01-28 DIAGNOSIS — R159 Full incontinence of feces: Secondary | ICD-10-CM

## 2020-01-28 DIAGNOSIS — M6281 Muscle weakness (generalized): Secondary | ICD-10-CM | POA: Diagnosis not present

## 2020-01-28 NOTE — Patient Instructions (Addendum)
Slow Contraction: Gravity Eliminated (Side-Lying)    Lie on left side, hips and knees slightly bent. Slowly squeeze pelvic floor for _5__ seconds. Rest for __5_ seconds. Repeat _10__ times. Do _3__ times a day.   Copyright  VHI. All rights reserved.  [facility] [address], [address] [place], [place] [address] Phone # [phone] Fax [phone]  

## 2020-01-28 NOTE — Therapy (Addendum)
[facility] [address] [place], [place], [address] Phone: [phone]   Fax:  [phone]  Physical Therapy Evaluation  Patient Details  Name: [name] MRN: [id number] Date of Birth: [birth year] Referring Provider (PT): Dr. Harl Bowie   Encounter Date: 01/28/2020  PT End of Session - 01/28/20 1211    Visit Number  1    Date for PT Re-Evaluation  03/24/20    Authorization Type  [employer]    PT Start Time  1145    PT Stop Time  1225    PT Time Calculation (min)  40 min    Activity Tolerance  Patient tolerated treatment well    Behavior During Therapy  [facility] for tasks assessed/performed       Past Medical History:  Diagnosis Date  . Allergy   . Asthma    Dianosed years ago-no meds at this time  . Cataract    Right eye removed-still has left cataract   . 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 Diabetes mellitus   . Dysrhythmia    "skips beats"  . Fibromyalgia 1980's  . Generalized anxiety disorder   . GERD (gastroesophageal reflux disease)    occ- will use Tums and Prolosec  prn   . Hyperlipemia   . Hypertension   . Hypothyroidism   . Irritable bowel syndrome 1980's  . Major depressive disorder   . Migraine headaches 1980's   On meds-well controlled  . 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  . Morbid obesity ([place])   . Pneumonia 2009   Several times over the past several years  . PONV (postoperative nausea and vomiting)   . Recurrent upper respiratory infection (URI)   . Sleep apnea    no cpap  . Subarachnoid hemorrhage ([place]) 2010  . Vaginal polyp    benign per pt    Past Surgical History:  Procedure Laterality Date  . CHOLECYSTECTOMY  1983  . COLONOSCOPY  2008   DB  . [place] OF UTERUS  1972  . HYSTEROSCOPY WITH D & C  06/29/2011   Procedure: DILATATION AND CURETTAGE (D&C) /HYSTEROSCOPY;  Surgeon: Donnamae Jude, MD;  Location: [facility];  Service: Gynecology;  Laterality: N/A;    There were no vitals filed for this visit.   Subjective Assessment - 01/28/20 1144    Subjective  Patient reports loose stool 12-13 years ago. The fecal incontinence started 1.5 years ago and getting progressively worse. In september her Brain calcification is worse.    Patient Stated Goals  reduce fecal incontinence;    Currently in Pain?  No/denies    Multiple Pain Sites  No         [facility] PT Assessment - 01/28/20 0001      Assessment   Medical Diagnosis  R15.9 Incontinence fo feces, unspecified fecal incontinence type; R19.7 Diarrhea, unspecified type    Referring Provider (PT)  Dr. Harl Bowie      Precautions   Precautions  None      Restrictions   Weight Bearing Restrictions  No      Balance Screen   Has the patient fallen in the past 6 months  Yes   due to parkinsons symptoms and calcification    How many times?  3    Has the patient had a decrease in activity level because of a fear of falling?   No  Is the patient reluctant to leave their home because of a fear of falling?   No      Home Film/video editor residence      Prior Function   Level of Independence  Independent    Vocation  Retired      Associate Professor   Overall Cognitive Status  Within Functional Limits for tasks assessed      Observation/Other Assessments   Focus on Therapeutic Outcomes (FOTO)   PFIQ-7 32%      Posture/Postural Control   Posture/Postural Control  Postural limitations    Postural Limitations  Rounded Shoulders;Forward head      ROM / Strength   AROM / PROM / Strength  AROM;PROM;Strength      AROM   Lumbar Flexion  decreased by 25%    Lumbar Extension  decreased by 50%    Lumbar - Right Side Bend  decreased by 50%    Lumbar - Left Side Bend  decreased by 50%    Lumbar - Right Rotation  decreased by 50%    Lumbar - Left Rotation  decreased by 50%      PROM   Right Hip  External Rotation   45      Strength   Right Hip Flexion  4/5    Right Hip Extension  4/5    Right Hip ABduction  3+/5    Left Hip Flexion  4/5    Left Hip Extension  4/5    Left Hip ABduction  3+/5      Palpation   SI assessment   ASIS are equal      Transfers   Comments  difficulty with turning in bed due to dizzness                Objective measurements completed on examination: See above findings.    Pelvic Floor Special Questions - 01/28/20 0001    Urinary Leakage  No    Urinary frequency  when has the urge she will have fecal leakage as she walks to the commode, patient feels like something is pushing the stool out    Fecal incontinence  Yes   2-8 pads per day; 2 pads per night; type 6 BM   Skin Integrity  Intact    Pelvic Floor Internal Exam  Patient confirms identification and aproves PT to assess pelvic floor and treatment    Exam Type  Rectal    Palpation  stool on the rectal area; initially contracted the gluteals then with verbal cues able to contract the anal sphincter; minimal pulling forward with the puborectalis    Strength  weak squeeze, no lift       [facility] Adult PT Treatment/Exercise - 01/28/20 0001      Neuro Re-ed    Neuro Re-ed Details   sidely with therapist finger in the anal canal doing a pelvic floor contraction with VC to no use her gluteals             PT Education - 01/28/20 1313    Education Details  Pelvic floor exercise in sidely    Person(s) Educated  Patient    Methods  Explanation;Demonstration;Verbal cues;Handout    Comprehension  Verbalized understanding;Returned demonstration       PT Short Term Goals - 01/28/20 1325      PT SHORT TERM GOAL #1   Title  independent with initial HEP    Time  4    Period  Weeks    Status  New    Target Date  02/25/20      PT SHORT TERM GOAL #2   Title  able to contract the pelvic floor without contracting the gluteals and pulls the puborectalis forward    Time  4    Period   Weeks    Status  New    Target Date  02/25/20      PT SHORT TERM GOAL #3   Title  understand bowel health and adding fiber to improve stool consistency    Time  4    Period  Weeks    Status  New    Target Date  02/25/20        PT Long Term Goals - 01/28/20 1326      PT LONG TERM GOAL #1   Title  independent with advanced HEP    Time  8    Period  Weeks    Status  New    Target Date  03/24/20      PT LONG TERM GOAL #2   Title  fecal leakage decreased >/= 75% due to improve strength >/= 3/5 holding for 20 seconds    Time  8    Period  Weeks    Status  New    Target Date  03/24/20      PT LONG TERM GOAL #3   Title  stool consistency is more Type 5 due to improved fiber ingestion so she has less fecal leakage    Time  8    Period  Weeks    Status  New    Target Date  03/24/20      PT LONG TERM GOAL #4   Title  wears 1 diaper during the day and 1 at night due to the reduction of fecal leakage    Time  8    Period  Weeks    Status  New    Target Date  03/24/20             Plan - 01/28/20 1214    Clinical Impression Statement  Patient is a 69 year old female with fecal incontinence for the past year and diarrhea. Patient also has calcification of the brain that is getting progressively worse. Patient reports she will go through 2-8 pads per day and 2 at night. Patient stool is Type 6. Pelvic floor strength is 2/5 with minimal contraction of the puborectalis. Patient had loose stool on the rectal area. Patient needed assistance to open the wipe package and to take the wipes out due to decreased hand mobility. Patient hip strength is 4/5. Patient walks with a single point cane. Patient reports she has fallen 6 times in the past 6 months due to her feeling dizzy and Parkinson like tremors. Patient would benefit from skilled therapy to reduce fecal leakage and improve overall strength.    Personal Factors and Comorbidities  Age;Fitness;Comorbidity 3+    Comorbidities   Fibromyalgia; calcification of the brain, diabetes, mild neurocognitive disorder    Examination-Activity Limitations  Bed Mobility;Locomotion Level;Continence;Toileting;Hygiene/Grooming    Examination-Participation Restrictions  Community Activity;Driving    Stability/Clinical Decision Making  Evolving/Moderate complexity    Clinical Decision Making  Moderate    Rehab Potential  Good    PT Frequency  1x / week   could be 1x every other week due to insurance   PT Duration  8 weeks    PT Treatment/Interventions  Biofeedback;Neuromuscular re-education;Therapeutic exercise;Therapeutic activities;Functional mobility  training;Patient/family education;Manual techniques    PT Next Visit Plan  work on pelvic floor contraction, bridge, ball squeeze, clam, bowel health, ask about the fiber    Consulted and Agree with Plan of Care  Patient       Patient will benefit from skilled therapeutic intervention in order to improve the following deficits and impairments:  Decreased coordination, Decreased range of motion, Increased fascial restricitons, Increased muscle spasms, Decreased activity tolerance, Decreased strength  Visit Diagnosis: Muscle weakness (generalized) - Plan: PT plan of care cert/re-cert  Other lack of coordination - Plan: PT plan of care cert/re-cert  Full incontinence of feces - Plan: PT plan of care cert/re-cert     Problem List Patient Active Problem List   Diagnosis Date Noted  . 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  . Essential tremor 06/24/2018  . Migraine without aura 06/24/2018  . Stress at home 08/28/2015  . Post-menopausal bleeding 06/16/2011  . Kidney disease 06/16/2011  . Colon polyp 06/16/2011  . Vaginal polyp 06/16/2011  . Chronic diarrhea 06/16/2011  . Diverticulitis 06/16/2011  . DUB (dysfunctional uterine bleeding) 06/16/2011  . Fatty liver 06/16/2011  . Diabetes mellitus 06/16/2011    Earlie Counts, PT 01/28/20 1:30 PM   [facility] [address] [place], [place], [address] Phone: [phone]   Fax:  [phone]  Name: [name] MRN: [id number] Date of Birth: 1951-03-25 PHYSICAL THERAPY DISCHARGE SUMMARY  Visits from Start of Care: 1  Current functional level related to goals / functional outcomes: See above. Patient has not returned since the initial evaluation.    Remaining deficits: See above.  Education / Equipment: HEP Plan:                                                    Patient goals were not met. Patient is being discharged due to not returning since the last visit. Thank you for the referral. Earlie Counts, PT 03/22/20 9:53 AM   ?????

## 2020-02-10 ENCOUNTER — Institutional Professional Consult (permissible substitution): Payer: Medicare HMO | Admitting: Neurology

## 2020-02-10 ENCOUNTER — Other Ambulatory Visit: Payer: Self-pay

## 2020-02-10 ENCOUNTER — Telehealth: Payer: Self-pay | Admitting: Gastroenterology

## 2020-02-10 DIAGNOSIS — G9389 Other specified disorders of brain: Secondary | ICD-10-CM | POA: Insufficient documentation

## 2020-02-10 DIAGNOSIS — R2689 Other abnormalities of gait and mobility: Secondary | ICD-10-CM | POA: Diagnosis not present

## 2020-02-10 DIAGNOSIS — M47812 Spondylosis without myelopathy or radiculopathy, cervical region: Secondary | ICD-10-CM | POA: Diagnosis not present

## 2020-02-10 MED ORDER — XIFAXAN 550 MG PO TABS: 550.0000 mg | ORAL_TABLET | Freq: Three times a day (TID) | ORAL | 0 refills | Status: DC

## 2020-02-10 NOTE — Telephone Encounter (Signed)
Spoke with the patient. Patient is going to PT for pelvic floor therapy. She is taking Benefiber 3 times daily. She took Amoxicillin 875 mg 2 times a day for 4 days. She feels this has improved her symptoms. The patient then had incontinence of "nearly diarrhea"this morning. She expresses concern that she may have a fungal infection inside her body causing her symptoms.  Please advise.

## 2020-02-10 NOTE — Telephone Encounter (Signed)
Called patient. No answer. Left a message with detailed information. Xifaxan sent to the [facility] on [address]. As listed in her chart.

## 2020-02-10 NOTE — Telephone Encounter (Signed)
Please advise patient to continue Benefiber and proceed with pelvic floor PT.  Please send Rx for Rifaximin 550mg  TID X14 days for IBS-diarrhea. Thanks

## 2020-02-11 ENCOUNTER — Telehealth: Payer: Self-pay | Admitting: Gastroenterology

## 2020-02-11 DIAGNOSIS — E1169 Type 2 diabetes mellitus with other specified complication: Secondary | ICD-10-CM | POA: Diagnosis not present

## 2020-02-11 DIAGNOSIS — E1165 Type 2 diabetes mellitus with hyperglycemia: Secondary | ICD-10-CM | POA: Diagnosis not present

## 2020-02-11 DIAGNOSIS — N189 Chronic kidney disease, unspecified: Secondary | ICD-10-CM | POA: Diagnosis not present

## 2020-02-11 DIAGNOSIS — E785 Hyperlipidemia, unspecified: Secondary | ICD-10-CM | POA: Diagnosis not present

## 2020-02-11 DIAGNOSIS — E1122 Type 2 diabetes mellitus with diabetic chronic kidney disease: Secondary | ICD-10-CM | POA: Diagnosis not present

## 2020-02-11 DIAGNOSIS — I10 Essential (primary) hypertension: Secondary | ICD-10-CM | POA: Diagnosis not present

## 2020-02-11 DIAGNOSIS — E782 Mixed hyperlipidemia: Secondary | ICD-10-CM | POA: Diagnosis not present

## 2020-02-11 DIAGNOSIS — E039 Hypothyroidism, unspecified: Secondary | ICD-10-CM | POA: Diagnosis not present

## 2020-02-11 DIAGNOSIS — E1129 Type 2 diabetes mellitus with other diabetic kidney complication: Secondary | ICD-10-CM | POA: Diagnosis not present

## 2020-02-11 MED ORDER — XIFAXAN 550 MG PO TABS: 550.0000 mg | ORAL_TABLET | Freq: Three times a day (TID) | ORAL | 0 refills | Status: DC

## 2020-02-11 NOTE — Telephone Encounter (Signed)
Resent Xifaxan to [facility] so they would initiate prior authorization.  Faxed notes and insurance info.

## 2020-02-15 ENCOUNTER — Ambulatory Visit: Payer: Medicare HMO | Admitting: Physical Therapy

## 2020-02-15 ENCOUNTER — Telehealth: Payer: Self-pay | Admitting: Gastroenterology

## 2020-02-15 NOTE — Telephone Encounter (Signed)
Received fax from [facility] - prior authorization in process.

## 2020-02-15 NOTE — Telephone Encounter (Signed)
[name] it looks like you was working on this Do you have any information

## 2020-02-16 NOTE — Telephone Encounter (Signed)
Received fax today that [name] was approved for patient , called pt to inform    Sent approval to be scanned in

## 2020-02-17 NOTE — Telephone Encounter (Signed)
approved

## 2020-02-25 ENCOUNTER — Other Ambulatory Visit: Payer: Self-pay

## 2020-02-25 ENCOUNTER — Emergency Department (HOSPITAL_COMMUNITY): Payer: Medicare HMO

## 2020-02-25 ENCOUNTER — Observation Stay (HOSPITAL_COMMUNITY)
Admission: EM | Admit: 2020-02-25 | Discharge: 2020-02-26 | Disposition: A | Discharge status: Home / Self Care | Payer: Medicare HMO | Attending: Internal Medicine | Admitting: Internal Medicine

## 2020-02-25 DIAGNOSIS — F329 Major depressive disorder, single episode, unspecified: Secondary | ICD-10-CM | POA: Insufficient documentation

## 2020-02-25 DIAGNOSIS — I959 Hypotension, unspecified: Secondary | ICD-10-CM | POA: Diagnosis not present

## 2020-02-25 DIAGNOSIS — G4489 Other headache syndrome: Secondary | ICD-10-CM | POA: Diagnosis not present

## 2020-02-25 DIAGNOSIS — Z6831 Body mass index (BMI) 31.0-31.9, adult: Secondary | ICD-10-CM | POA: Insufficient documentation

## 2020-02-25 DIAGNOSIS — N183 Chronic kidney disease, stage 3 unspecified: Secondary | ICD-10-CM

## 2020-02-25 DIAGNOSIS — R531 Weakness: Secondary | ICD-10-CM | POA: Diagnosis not present

## 2020-02-25 DIAGNOSIS — Z91018 Allergy to other foods: Secondary | ICD-10-CM | POA: Insufficient documentation

## 2020-02-25 DIAGNOSIS — Z8052 Family history of malignant neoplasm of bladder: Secondary | ICD-10-CM | POA: Insufficient documentation

## 2020-02-25 DIAGNOSIS — E785 Hyperlipidemia, unspecified: Secondary | ICD-10-CM | POA: Diagnosis not present

## 2020-02-25 DIAGNOSIS — K219 Gastro-esophageal reflux disease without esophagitis: Secondary | ICD-10-CM | POA: Diagnosis not present

## 2020-02-25 DIAGNOSIS — E1122 Type 2 diabetes mellitus with diabetic chronic kidney disease: Secondary | ICD-10-CM | POA: Diagnosis not present

## 2020-02-25 DIAGNOSIS — M797 Fibromyalgia: Secondary | ICD-10-CM | POA: Diagnosis not present

## 2020-02-25 DIAGNOSIS — E876 Hypokalemia: Secondary | ICD-10-CM

## 2020-02-25 DIAGNOSIS — R42 Dizziness and giddiness: Secondary | ICD-10-CM | POA: Diagnosis not present

## 2020-02-25 DIAGNOSIS — Z7982 Long term (current) use of aspirin: Secondary | ICD-10-CM | POA: Insufficient documentation

## 2020-02-25 DIAGNOSIS — I498 Other specified cardiac arrhythmias: Secondary | ICD-10-CM | POA: Insufficient documentation

## 2020-02-25 DIAGNOSIS — G43009 Migraine without aura, not intractable, without status migrainosus: Secondary | ICD-10-CM | POA: Diagnosis present

## 2020-02-25 DIAGNOSIS — Z8249 Family history of ischemic heart disease and other diseases of the circulatory system: Secondary | ICD-10-CM | POA: Insufficient documentation

## 2020-02-25 DIAGNOSIS — Z833 Family history of diabetes mellitus: Secondary | ICD-10-CM | POA: Insufficient documentation

## 2020-02-25 DIAGNOSIS — G250 Essential tremor: Secondary | ICD-10-CM | POA: Diagnosis not present

## 2020-02-25 DIAGNOSIS — N189 Chronic kidney disease, unspecified: Secondary | ICD-10-CM | POA: Insufficient documentation

## 2020-02-25 DIAGNOSIS — G4730 Sleep apnea, unspecified: Secondary | ICD-10-CM | POA: Insufficient documentation

## 2020-02-25 DIAGNOSIS — K589 Irritable bowel syndrome without diarrhea: Secondary | ICD-10-CM | POA: Diagnosis not present

## 2020-02-25 DIAGNOSIS — D649 Anemia, unspecified: Secondary | ICD-10-CM | POA: Diagnosis not present

## 2020-02-25 DIAGNOSIS — E039 Hypothyroidism, unspecified: Secondary | ICD-10-CM | POA: Diagnosis not present

## 2020-02-25 DIAGNOSIS — Z20822 Contact with and (suspected) exposure to covid-19: Secondary | ICD-10-CM | POA: Diagnosis not present

## 2020-02-25 DIAGNOSIS — R000 Tachycardia, unspecified: Secondary | ICD-10-CM | POA: Diagnosis not present

## 2020-02-25 DIAGNOSIS — Z818 Family history of other mental and behavioral disorders: Secondary | ICD-10-CM | POA: Insufficient documentation

## 2020-02-25 DIAGNOSIS — Z888 Allergy status to other drugs, medicaments and biological substances status: Secondary | ICD-10-CM | POA: Insufficient documentation

## 2020-02-25 DIAGNOSIS — F419 Anxiety disorder, unspecified: Secondary | ICD-10-CM | POA: Insufficient documentation

## 2020-02-25 DIAGNOSIS — I129 Hypertensive chronic kidney disease with stage 1 through stage 4 chronic kidney disease, or unspecified chronic kidney disease: Secondary | ICD-10-CM | POA: Insufficient documentation

## 2020-02-25 DIAGNOSIS — Z79899 Other long term (current) drug therapy: Secondary | ICD-10-CM | POA: Insufficient documentation

## 2020-02-25 DIAGNOSIS — K760 Fatty (change of) liver, not elsewhere classified: Secondary | ICD-10-CM | POA: Diagnosis not present

## 2020-02-25 DIAGNOSIS — I6782 Cerebral ischemia: Secondary | ICD-10-CM | POA: Diagnosis not present

## 2020-02-25 DIAGNOSIS — I4891 Unspecified atrial fibrillation: Secondary | ICD-10-CM | POA: Diagnosis not present

## 2020-02-25 DIAGNOSIS — G9340 Encephalopathy, unspecified: Principal | ICD-10-CM | POA: Diagnosis present

## 2020-02-25 DIAGNOSIS — R410 Disorientation, unspecified: Secondary | ICD-10-CM

## 2020-02-25 DIAGNOSIS — Z882 Allergy status to sulfonamides status: Secondary | ICD-10-CM | POA: Insufficient documentation

## 2020-02-25 DIAGNOSIS — Z800 Family history of malignant neoplasm of digestive organs: Secondary | ICD-10-CM | POA: Insufficient documentation

## 2020-02-25 DIAGNOSIS — J45909 Unspecified asthma, uncomplicated: Secondary | ICD-10-CM | POA: Diagnosis not present

## 2020-02-25 DIAGNOSIS — Z8261 Family history of arthritis: Secondary | ICD-10-CM | POA: Insufficient documentation

## 2020-02-25 DIAGNOSIS — G43909 Migraine, unspecified, not intractable, without status migrainosus: Secondary | ICD-10-CM | POA: Insufficient documentation

## 2020-02-25 DIAGNOSIS — D631 Anemia in chronic kidney disease: Secondary | ICD-10-CM | POA: Diagnosis not present

## 2020-02-25 DIAGNOSIS — Z9001 Acquired absence of eye: Secondary | ICD-10-CM | POA: Insufficient documentation

## 2020-02-25 DIAGNOSIS — R4182 Altered mental status, unspecified: Secondary | ICD-10-CM | POA: Diagnosis not present

## 2020-02-25 DIAGNOSIS — Z9049 Acquired absence of other specified parts of digestive tract: Secondary | ICD-10-CM | POA: Insufficient documentation

## 2020-02-25 DIAGNOSIS — E119 Type 2 diabetes mellitus without complications: Secondary | ICD-10-CM

## 2020-02-25 DIAGNOSIS — Z794 Long term (current) use of insulin: Secondary | ICD-10-CM | POA: Insufficient documentation

## 2020-02-25 NOTE — ED Triage Notes (Signed)
Pt BIB [facility] from home c/o [facility]MS. Pt states she was sitting at home reading when she began to feel "weird." pt states she began to feel dizzy, slurred speech, and weakness. Pt states she felt confused as well. HX severe brain calcification.   Upon arrival pt A&OX4. Pt. Reports numbness all over.

## 2020-02-25 NOTE — ED Provider Notes (Signed)
[facility] EMERGENCY DEPARTMENT Provider Note   CSN: [id number] Arrival date & time: 02/25/20  2305   History Chief Complaint  Patient presents with  . Altered Mental Status    [name] is a 69 y.o. female.  The history is provided by the patient.  Altered Mental Status  He has history of hypertension, diabetes, hyperlipidemia, essential tremor and comes in because of confusion.  She was reading a book about 2 hours ago when she noted that she was getting dizzy like she was drunk and she felt like she was confused and could not think straight.  She also has been having global headaches for the last 2 days, pain and generally worse on the right.  She denies any focal weakness but states that she feels generally weak.  She denies visual change, nausea, vomiting.  She does relate that she has been having problems with fluid retention and she tells me that her neurosurgeon has told her that she has calcium in her brain and fluid around her spine.  Past Medical History:  Diagnosis Date  . Allergy   . Asthma    Dianosed years ago-no meds at this time  . Cataract    Right eye removed-still has left cataract   . 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 Diabetes mellitus   . Dysrhythmia    "skips beats"  . Fibromyalgia 1980's  . Generalized anxiety disorder   . GERD (gastroesophageal reflux disease)    occ- will use Tums and Prolosec  prn   . Hyperlipemia   . Hypertension   . Hypothyroidism   . Irritable bowel syndrome 1980's  . Major depressive disorder   . Migraine headaches 1980's   On meds-well controlled  . 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  . Morbid obesity ([place])   . Pneumonia 2009   Several times over the past several years  . PONV (postoperative nausea and vomiting)   . Recurrent upper respiratory infection (URI)   . Sleep apnea    no cpap  . Subarachnoid hemorrhage ([place]) 2010    . Vaginal polyp    benign per pt    Patient Active Problem List   Diagnosis Date Noted  . 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  . Essential tremor 06/24/2018  . Migraine without aura 06/24/2018  . Stress at home 08/28/2015  . Post-menopausal bleeding 06/16/2011  . Kidney disease 06/16/2011  . Colon polyp 06/16/2011  . Vaginal polyp 06/16/2011  . Chronic diarrhea 06/16/2011  . Diverticulitis 06/16/2011  . DUB (dysfunctional uterine bleeding) 06/16/2011  . Fatty liver 06/16/2011  . Diabetes mellitus 06/16/2011    Past Surgical History:  Procedure Laterality Date  . CHOLECYSTECTOMY  1983  . COLONOSCOPY  2008   DB  . [place] OF UTERUS  1972  . HYSTEROSCOPY WITH D & C  06/29/2011   Procedure: DILATATION AND CURETTAGE (D&C) /HYSTEROSCOPY;  Surgeon: Donnamae Jude, MD;  Location: [facility];  Service: Gynecology;  Laterality: N/A;     OB History    Gravida  5   Para  3   Term  3   Preterm  0   AB  2   Living  3     SAB  2   TAB      Ectopic      Multiple      Live Births  Family History  Problem Relation Age of Onset  . Liver cancer Father        mets to liver   . Bladder Cancer Father   . Colon cancer Father        mets to colon   . Anesthesia problems Mother        hard to wake post op   . High blood pressure Other   . Diabetes Other   . Arthritis Other   . Anesthesia problems Son        stopped breathing post op   . Tremor Son   . Schizophrenia Daughter        Schizoaffective disorder  . Anesthesia problems Granddaughter        PONV  . Dementia Neg Hx   . Esophageal cancer Neg Hx   . Rectal cancer Neg Hx   . Stomach cancer Neg Hx   . Colon polyps Neg Hx     Social History   Tobacco Use  . Smoking status: Never Smoker  . Smokeless tobacco: Never Used  Substance Use Topics  . Alcohol use: No    Comment: quit 1978  . Drug use: No    Home Medications Prior to Admission medications   Medication Sig  Start Date End Date Taking? Authorizing Provider  amitriptyline (ELAVIL) 25 MG tablet Take 25 mg by mouth at bedtime.    [provider]  aspirin 325 MG tablet Take 325 mg by mouth at bedtime.    [provider]  bumetanide (BUMEX) 0.5 MG tablet Take 0.5 mg by mouth every morning. 12/25/19   [provider]  Ergocalciferol (VITAMIN D2) 2000 UNITS TABS Take 2,000 Units by mouth daily.    [provider]  gabapentin (NEURONTIN) 100 MG capsule Take 1 capsule (100 mg total) by mouth 3 (three) times daily. 01/05/20   Melvenia Beam, MD  levothyroxine (SYNTHROID, LEVOTHROID) 50 MCG tablet Take 50 mcg by mouth daily.     [provider]  XIFAXAN 550 MG TABS tablet Take 1 tablet (550 mg total) by mouth 3 (three) times daily for 14 days. 02/11/20 02/25/20  Mauri Pole, MD    Allergies    Acarbose, Atenolol, Atorvastatin, Cinnamon, Crestor [rosuvastatin calcium], Glimepiride, Invokana [canagliflozin], Januvia [sitagliptin], Losartan potassium, Metformin and related, Pravastatin, Sulfamethoxazole, and Sulfa antibiotics  Review of Systems   Review of Systems  All other systems reviewed and are negative.   Physical Exam Updated Vital Signs BP (!) 142/52   Pulse 99   Temp 98.2 F (36.8 C) (Oral)   Resp 17   Ht 5\' 4"  (1.626 m)   Wt 82.1 kg   SpO2 98%   BMI 31.07 kg/m   Physical Exam Vitals and nursing note reviewed.   69 year old female, resting comfortably and in no acute distress. Vital signs are significant for borderline elevated blood pressure. Oxygen saturation is 98%, which is normal. Head is normocephalic and atraumatic. PERRLA, EOMI. Oropharynx is clear. Neck is nontender and supple without adenopathy or JVD.  There are no carotid bruits. Back is nontender and there is no CVA tenderness. Lungs are clear without rales, wheezes, or rhonchi. Chest is nontender. Heart has an irregular rhythm without murmur. Abdomen is soft, flat,  nontender without masses or hepatosplenomegaly and peristalsis is normoactive. Extremities have 1+ edema, full range of motion is present. Skin is warm and dry without rash. Neurologic: Mental status is normal, cranial nerves are intact.  There is no  pronator drift or extinction on double simultaneous stimulation.  However, she is right-handed and right arm and leg are slightly weaker than the left with strength 4+/5.  ED Results / Procedures / Treatments   Labs (all labs ordered are listed, but only abnormal results are displayed) Labs Reviewed - No data to display  EKG Sinus tachycardia 112 bpm with frequent PVCs.  Normal axis, normal intervals.  Normal ST-T wave.  When compared with ECG of 12/29/2019, PVCs are now present.  Radiology CT HEAD WO CONTRAST  Result Date: 02/26/2020 CLINICAL DATA:  69 year old female with acute mental status change. Dizziness, slurred speech, weakness. EXAM: CT HEAD WITHOUT CONTRAST TECHNIQUE: Contiguous axial images were obtained from the base of the skull through the vertex without intravenous contrast. COMPARISON:  Head CT 12/29/2019. Brain MRI 09/09/2015. FINDINGS: Brain: Prominent chronic dystrophic calcifications again noted in the bilateral basal ganglia and deep cerebellar nuclei raising the possibility of Fahr Disease. No midline shift, ventriculomegaly, mass effect, evidence of mass lesion, intracranial hemorrhage or evidence of cortically based acute infarction. Gray-white matter differentiation is within normal limits throughout the brain. Vascular: Calcified atherosclerosis at the skull base. No suspicious intracranial vascular hyperdensity. Skull: Stable, intact. Sinuses/Orbits: Visualized paranasal sinuses and mastoids are stable and well pneumatized. Other: No acute orbit or scalp soft tissue finding. IMPRESSION: 1. No acute intracranial abnormality. 2. Stable non contrast CT appearance of the brain with extensive dystrophic calcifications in the basal  ganglia and deep cerebellar nuclei raising the possibility of Fahr Disease. Electronically Signed   By: Genevie Ann M.D.   On: 02/26/2020 00:25   DG Chest Port 1 View  Result Date: 02/25/2020 CLINICAL DATA:  69 year old female with altered mental status, confusion. EXAM: PORTABLE CHEST 1 VIEW COMPARISON:  Chest radiographs 09/23/2019 and earlier. FINDINGS: Portable AP upright view at 2345 hours. Lung volumes and mediastinal contours remain within normal limits. Visualized tracheal air column is within normal limits. Allowing for portable technique the lungs are clear. Chronic right rib fractures appear stable. No acute osseous abnormality identified. IMPRESSION: No acute cardiopulmonary abnormality. Chronic right rib fractures. Electronically Signed   By: Genevie Ann M.D.   On: 02/25/2020 23:58    Procedures Procedures   Medications Ordered in ED Medications  potassium chloride SA (KLOR-CON) CR tablet 40 mEq (40 mEq Oral Given 02/26/20 0405)    ED Course  I have reviewed the triage vital signs and the nursing notes.  Pertinent labs & imaging results that were available during my care of the patient were reviewed by me and considered in my medical decision making (see chart for details).  MDM Rules/Calculators/A&P Confusion and headache and perhaps mild right-sided weakness.  Cardiac monitor shows sinus rhythm with frequent PVCs.  No clear deficits to warrant initiating code stroke.  Will proceed with a CT of head and screening labs.  Old records are reviewed, and she had seen cardiology last December and had a Zio patch which showed bursts of tachycardia but no episodes of atrial fibrillation.  Echocardiogram showed normal ejection fraction and no diastolic dysfunction.  CT of head shows no acute process.  Labs are significant for mild anemia and mild hypokalemia.  She is given a dose of oral potassium.  Anemia is significant and that hemoglobin has dropped 1.7 g compared with January 12.  Will send  for MRI of brain and MRA of head.  Case is discussed with Dr. Myna Hidalgo of [facility], who agrees to admit the patient.  Final Clinical Impression(s) /  ED Diagnoses Final diagnoses:  Weakness  Normochromic normocytic anemia  Hypokalemia  Confusion    Rx / DC Orders ED Discharge Orders    None       Delora Fuel, MD 0000000 [phone]

## 2020-02-26 ENCOUNTER — Emergency Department (HOSPITAL_COMMUNITY): Payer: Medicare HMO

## 2020-02-26 ENCOUNTER — Encounter (HOSPITAL_COMMUNITY): Payer: Self-pay | Admitting: Family Medicine

## 2020-02-26 ENCOUNTER — Observation Stay (HOSPITAL_COMMUNITY): Payer: Medicare HMO

## 2020-02-26 DIAGNOSIS — E039 Hypothyroidism, unspecified: Secondary | ICD-10-CM

## 2020-02-26 DIAGNOSIS — G43009 Migraine without aura, not intractable, without status migrainosus: Secondary | ICD-10-CM

## 2020-02-26 DIAGNOSIS — G9340 Encephalopathy, unspecified: Secondary | ICD-10-CM | POA: Diagnosis not present

## 2020-02-26 DIAGNOSIS — E119 Type 2 diabetes mellitus without complications: Secondary | ICD-10-CM

## 2020-02-26 DIAGNOSIS — G459 Transient cerebral ischemic attack, unspecified: Secondary | ICD-10-CM | POA: Diagnosis not present

## 2020-02-26 LAB — BASIC METABOLIC PANEL
Anion gap: 12 (ref 5–15)
BUN: 13 mg/dL (ref 8–23)
CO2: 26 mmol/L (ref 22–32)
Calcium: 9.3 mg/dL (ref 8.9–10.3)
Chloride: 102 mmol/L (ref 98–111)
Creatinine, Ser: 0.87 mg/dL (ref 0.44–1.00)
GFR calc Af Amer: >60 mL/min (ref >60)
GFR calc non Af Amer: >60 mL/min (ref >60)
Glucose, Bld: 221 mg/dL — ABNORMAL HIGH (ref 70–99)
Potassium: 3.7 mmol/L (ref 3.5–5.1)
Sodium: 140 mmol/L (ref 135–145)

## 2020-02-26 LAB — CBC WITH DIFFERENTIAL/PLATELET
Abs Immature Granulocytes: 0.01 10*3/uL (ref 0.00–0.07)
Basophils Absolute: 0 10*3/uL (ref 0.0–0.1)
Basophils Relative: 1 %
Eosinophils Absolute: 0.1 10*3/uL (ref 0.0–0.5)
Eosinophils Relative: 2 %
HCT: 38 % (ref 36.0–46.0)
Hemoglobin: 12.3 g/dL (ref 12.0–15.0)
Immature Granulocytes: 0 %
Lymphocytes Relative: 39 %
Lymphs Abs: 1.2 10*3/uL (ref 0.7–4.0)
MCH: 29.9 pg (ref 26.0–34.0)
MCHC: 32.4 g/dL (ref 30.0–36.0)
MCV: 92.2 fL (ref 80.0–100.0)
Monocytes Absolute: 0.3 10*3/uL (ref 0.1–1.0)
Monocytes Relative: 8 %
Neutro Abs: 1.5 10*3/uL — ABNORMAL LOW (ref 1.7–7.7)
Neutrophils Relative %: 50 %
Platelets: 187 10*3/uL (ref 150–400)
RBC: 4.12 MIL/uL (ref 3.87–5.11)
RDW: 13.8 % (ref 11.5–15.5)
WBC: 3 10*3/uL — ABNORMAL LOW (ref 4.0–10.5)
nRBC: 0 % (ref 0.0–0.2)

## 2020-02-26 LAB — DIFFERENTIAL
Abs Immature Granulocytes: 0.01 10*3/uL (ref 0.00–0.07)
Basophils Absolute: 0 10*3/uL (ref 0.0–0.1)
Basophils Relative: 0 %
Eosinophils Absolute: 0.1 10*3/uL (ref 0.0–0.5)
Eosinophils Relative: 2 %
Immature Granulocytes: 0 %
Lymphocytes Relative: 27 %
Lymphs Abs: 1 10*3/uL (ref 0.7–4.0)
Monocytes Absolute: 0.3 10*3/uL (ref 0.1–1.0)
Monocytes Relative: 8 %
Neutro Abs: 2.2 10*3/uL (ref 1.7–7.7)
Neutrophils Relative %: 63 %

## 2020-02-26 LAB — COMPREHENSIVE METABOLIC PANEL
ALT: 14 U/L (ref 0–44)
AST: 18 U/L (ref 15–41)
Albumin: 3.3 g/dL — ABNORMAL LOW (ref 3.5–5.0)
Alkaline Phosphatase: 50 U/L (ref 38–126)
Anion gap: 13 (ref 5–15)
BUN: 16 mg/dL (ref 8–23)
CO2: 23 mmol/L (ref 22–32)
Calcium: 8.9 mg/dL (ref 8.9–10.3)
Chloride: 103 mmol/L (ref 98–111)
Creatinine, Ser: 0.96 mg/dL (ref 0.44–1.00)
GFR calc Af Amer: >60 mL/min (ref >60)
GFR calc non Af Amer: 60 mL/min — ABNORMAL LOW (ref >60)
Glucose, Bld: 291 mg/dL — ABNORMAL HIGH (ref 70–99)
Potassium: 3.4 mmol/L — ABNORMAL LOW (ref 3.5–5.1)
Sodium: 139 mmol/L (ref 135–145)
Total Bilirubin: 0.6 mg/dL (ref 0.3–1.2)
Total Protein: 5.6 g/dL — ABNORMAL LOW (ref 6.5–8.1)

## 2020-02-26 LAB — URINALYSIS, ROUTINE W REFLEX MICROSCOPIC
Bacteria, UA: NONE SEEN
Bilirubin Urine: NEGATIVE
Glucose, UA: >=500 mg/dL — AB
Hgb urine dipstick: NEGATIVE
Ketones, ur: NEGATIVE mg/dL
Nitrite: NEGATIVE
Protein, ur: NEGATIVE mg/dL
Specific Gravity, Urine: 1.012 (ref 1.005–1.030)
pH: 6 (ref 5.0–8.0)

## 2020-02-26 LAB — CBC
HCT: 35.3 % — ABNORMAL LOW (ref 36.0–46.0)
Hemoglobin: 11.7 g/dL — ABNORMAL LOW (ref 12.0–15.0)
MCH: 29.8 pg (ref 26.0–34.0)
MCHC: 33.1 g/dL (ref 30.0–36.0)
MCV: 90.1 fL (ref 80.0–100.0)
Platelets: 174 10*3/uL (ref 150–400)
RBC: 3.92 MIL/uL (ref 3.87–5.11)
RDW: 13.8 % (ref 11.5–15.5)
WBC: 3.5 10*3/uL — ABNORMAL LOW (ref 4.0–10.5)
nRBC: 0 % (ref 0.0–0.2)

## 2020-02-26 LAB — PROTIME-INR
INR: 1 (ref 0.8–1.2)
Prothrombin Time: 13.2 seconds (ref 11.4–15.2)

## 2020-02-26 LAB — SARS CORONAVIRUS 2 (TAT 6-24 HRS): SARS Coronavirus 2: NEGATIVE

## 2020-02-26 LAB — CBG MONITORING, ED
Glucose-Capillary: 196 mg/dL — ABNORMAL HIGH (ref 70–99)
Glucose-Capillary: 185 mg/dL — ABNORMAL HIGH (ref 70–99)

## 2020-02-26 LAB — ETHANOL: Alcohol, Ethyl (B): <10 mg/dL (ref <10)

## 2020-02-26 LAB — SEDIMENTATION RATE: Sed Rate: 16 mm/hr (ref 0–22)

## 2020-02-26 LAB — RAPID URINE DRUG SCREEN, HOSP PERFORMED
Amphetamines: NOT DETECTED
Barbiturates: NOT DETECTED
Benzodiazepines: NOT DETECTED
Cocaine: NOT DETECTED
Opiates: NOT DETECTED
Tetrahydrocannabinol: NOT DETECTED

## 2020-02-26 LAB — APTT: aPTT: 27 seconds (ref 24–36)

## 2020-02-26 LAB — VITAMIN B12: Vitamin B-12: 171 pg/mL — ABNORMAL LOW (ref 180–914)

## 2020-02-26 LAB — CK: Total CK: 37 U/L — ABNORMAL LOW (ref 38–234)

## 2020-02-26 LAB — TSH: TSH: 0.917 u[IU]/mL (ref 0.350–4.500)

## 2020-02-26 LAB — HEMOGLOBIN A1C
Hgb A1c MFr Bld: 6.9 % — ABNORMAL HIGH (ref 4.8–5.6)
Mean Plasma Glucose: 151.33 mg/dL

## 2020-02-26 LAB — AMMONIA: Ammonia: 20 umol/L (ref 9–35)

## 2020-02-26 LAB — RPR: RPR Ser Ql: NONREACTIVE

## 2020-02-26 LAB — PHOSPHORUS: Phosphorus: 4.2 mg/dL (ref 2.5–4.6)

## 2020-02-26 LAB — MAGNESIUM: Magnesium: 1.9 mg/dL (ref 1.7–2.4)

## 2020-02-26 MED ORDER — CYANOCOBALAMIN 1000 MCG/ML IJ SOLN
1000.0000 ug | Freq: Once | INTRAMUSCULAR | Status: AC
  Administered 2020-02-26: 1000 ug via INTRAMUSCULAR
  Filled 2020-02-26: qty 1

## 2020-02-26 MED ORDER — POTASSIUM CHLORIDE CRYS ER 20 MEQ PO TBCR
40.0000 meq | EXTENDED_RELEASE_TABLET | Freq: Once | ORAL | Status: AC
  Administered 2020-02-26: 40 meq via ORAL
  Filled 2020-02-26: qty 2

## 2020-02-26 MED ORDER — INSULIN ASPART 100 UNIT/ML ~~LOC~~ SOLN
0.0000 [IU] | Freq: Three times a day (TID) | SUBCUTANEOUS | Status: DC
  Administered 2020-02-26: 2 [IU] via SUBCUTANEOUS

## 2020-02-26 NOTE — Evaluation (Signed)
Physical Therapy Evaluation Patient Details Name: [name] MRN: [id number] DOB: 1951/05/23 Today's Date: 02/26/2020   History of Present Illness  Pt is a 69 y/o female admitted secondary to acute metabolic encephalopathy. Pt with known calcifications of the brain, and MRI revealed no acute finding. PMH includes HTN, DM, asthma, fibromyalgia, migraines, tremors, and memory loss.   Clinical Impression  Pt admitted secondary to problem above with deficits below. Pt requiring min guard to min A for mobility using cane. Educated about using RW at home to improve safety. Reports granddaughter is at home with pt most of the time, but granddaughter does go to school during the day. Feel pt would benefit from use of RW at home. Currently refusing HHPT. Will continue to follow acutely to maximize functional mobility independence and safety.     Follow Up Recommendations Supervision/Assistance - 24 hour;Other (comment)(pt refusing HHPT )    Equipment Recommendations  Rolling walker with 5" wheels    Recommendations for Other Services       Precautions / Restrictions Precautions Precautions: Fall Restrictions Weight Bearing Restrictions: No      Mobility  Bed Mobility Overal bed mobility: Modified Independent                Transfers Overall transfer level: Needs assistance Equipment used: Straight cane Transfers: Sit to/from Stand Sit to Stand: Min guard         General transfer comment: Min guard for safety.   Ambulation/Gait Ambulation/Gait assistance: Min guard Gait Distance (Feet): 120 Feet Assistive device: Straight cane Gait Pattern/deviations: Step-through pattern;Decreased stride length Gait velocity: Decreased   General Gait Details: Slow, guarded gait. 1 LOB noted, requiring min A for steadying. Educated about using RW for increase safety, especially when granddaughter is not home.   Stairs            Wheelchair Mobility    Modified  Rankin (Stroke Patients Only)       Balance Overall balance assessment: Needs assistance Sitting-balance support: Feet supported;No upper extremity supported Sitting balance-Leahy Scale: Good     Standing balance support: Single extremity supported;During functional activity Standing balance-Leahy Scale: Poor Standing balance comment: Reliant on at least 1 UE support                              Pertinent Vitals/Pain Pain Assessment: No/denies pain    Home Living Family/patient expects to be discharged to:: Private residence Living Arrangements: Other relatives(granddaughter) Available Help at Discharge: Family;Available PRN/intermittently Type of Home: House Home Access: Level entry     Home Layout: One level Home Equipment: Cane - single point      Prior Function Level of Independence: Independent with assistive device(s)         Comments: Uses cane for ambulation.      Hand Dominance        Extremity/Trunk Assessment   Upper Extremity Assessment Upper Extremity Assessment: Generalized weakness    Lower Extremity Assessment Lower Extremity Assessment: Generalized weakness    Cervical / Trunk Assessment Cervical / Trunk Assessment: Normal  Communication   Communication: No difficulties  Cognition Arousal/Alertness: Awake/alert Behavior During Therapy: WFL for tasks assessed/performed Overall Cognitive Status: History of cognitive impairments - at baseline                                 General Comments: Memory deficits at  baseline       General Comments General comments (skin integrity, edema, etc.): Educated about HHPT, however, pt reports she would prefer not to have them come to her house at this time.     Exercises     Assessment/Plan    PT Assessment Patient needs continued PT services  PT Problem List Decreased strength;Decreased balance;Decreased mobility;Decreased cognition;Decreased knowledge of use of  DME;Decreased knowledge of precautions       PT Treatment Interventions DME instruction;Gait training;Functional mobility training;Therapeutic activities;Therapeutic exercise;Balance training;Patient/family education    PT Goals (Current goals can be found in the Care Plan section)  Acute Rehab PT Goals Patient Stated Goal: to go home PT Goal Formulation: With patient Time For Goal Achievement: 03/11/20 Potential to Achieve Goals: Good    Frequency Min 3X/week   Barriers to discharge        Co-evaluation               AM-PAC PT "6 Clicks" Mobility  Outcome Measure Help needed turning from your back to your side while in a flat bed without using bedrails?: None Help needed moving from lying on your back to sitting on the side of a flat bed without using bedrails?: None Help needed moving to and from a bed to a chair (including a wheelchair)?: A Little Help needed standing up from a chair using your arms (e.g., wheelchair or bedside chair)?: A Little Help needed to walk in hospital room?: A Little Help needed climbing 3-5 steps with a railing? : A Lot 6 Click Score: 19    End of Session Equipment Utilized During Treatment: Gait belt Activity Tolerance: Patient tolerated treatment well Patient left: in bed;with call bell/phone within reach Nurse Communication: Mobility status PT Visit Diagnosis: Unsteadiness on feet (R26.81);Muscle weakness (generalized) (M62.81)    Time: [id number] PT Time Calculation (min) (ACUTE ONLY): 18 min   Charges:   PT Evaluation $PT Eval Moderate Complexity: 1 Mod          Reuel Derby, PT, DPT  Acute Rehabilitation Services  Pager: [phone] Office: [phone]   Rudean Hitt 02/26/2020, 12:04 PM

## 2020-02-26 NOTE — ED Notes (Signed)
Pt transported to MRI 

## 2020-02-26 NOTE — Discharge Summary (Signed)
Physician Discharge Summary  [name] [id number] DOB: July 30, 1951 DOA: 02/25/2020  PCP: Hulan Fess, MD  Admit date: 02/25/2020 Discharge date: 02/26/2020  Admitted From: home Discharge disposition: home   Recommendations for Outpatient Follow-Up:   1. Follow up with PCP 2-3 weeks for evaluation of B12 level and diabetes control 2. HH recommended but patient declined 3. Follow up with neurology at [facility] as scheduled. Chart indicates appointment 09/05/20 but patient indicates she has changed it to April   Discharge Diagnosis:   Principal Problem:   Acute encephalopathy Active Problems:   Diabetes mellitus type II, non insulin dependent ([place])   Essential tremor   Migraine without aura   Hypothyroidism    Discharge Condition: resolved  Diet recommendation: Low sodium, heart healthy.  .  Wound care: None.  Code status: Full.   History of Present Illness:   [name] is a 69 y.o. female with medical history significant for diet-controlled diabetes mellitus, fibromyalgia, anxiety, migraines, leg swelling, hypothyroidism, chronic diarrhea with fecal incontinence, tremors, memory loss, and progressive extensive brain calcifications,presented 3/11 to the emergency department with acute onset multiple symptoms including confusion, speech difficulty, numbness, heavy sensation, and dizziness.  Patient reported that she had been "really sick for the past year" with leg swelling, palpitations, numbness, headaches, diarrhea with incontinence, and memory problems, but had an acute onset of confusion and other aforementioned symptoms while she was seated and reading a book last night, and had improved but not yet back to baseline.  Currently in the ED, she continues to express difficulty finding the right words to use, feels as though she is confused, is experiencing numbness throughout all extremities, and a sense of heaviness as though a heavy blanket  is lying over her.  She had been experiencing headaches for the past 2 days, but none now.  She denied any acute change in her vision or hearing, denied fevers, chills, or neck stiffness, and denied chest pain or palpitations.  She is followed by neurology, reported that she is scheduled to see a subspecialist at [facility] regarding her progressive brain calcifications, has seen the cardiology with long-term heart monitor that did not show any atrial fibrillation or sustained arrhythmia, and had echocardiogram recently with preserved EF.  She is being treated by GI for her chronic diarrhea.   Hospital Course by Problem:   1. Acute encephalopathy resolved at discharge.  Presented with acute-onset confusion and other non-focal neurologic symptoms . No acute findings on head CT which is notable for extensive calcifications for which she is undergoing outpatient workup. UDS and EtOH negative, no fevers or meningismus, pt alert with no seizure-like activity. MRI brain without acute abnormality. TSH, ammonia and RPR within limits of normal. B12 low. Provided with B12 injection. Suspect symptoms could be related to progressive brain calcification disease. Her primary neurologist has referred her to subspecialist at [facility] for evaluation of possible [facility]. She has appointment 1 month.  Evaluated by PT who recommend [facility] PT that patient declined. Ambulated in room without issue. Tolerated breakfast and lunch. Will discharge to home with instructions to follow up with PCP to track B12 level and to keep appointment with neurologist at [facility].    2. Type II DM serum glucose 280. A1c 6.9.  Reports diet-control. Has been instructed to follow up with PCP regarding diabetes control   3. Migraines denied headache. Continue amitriptyline   4. Hypothyroidism Continue Synthroid     Medical Consultants:  Discharge Exam:   Vitals:   02/26/20 0612 02/26/20 0715  BP: 135/81   Pulse: 96 (!) 47  Resp: 16 18    Temp:    SpO2: 98% 100%   Vitals:   02/26/20 0445 02/26/20 0500 02/26/20 0612 02/26/20 0715  BP: (!) 153/89 129/83 135/81   Pulse: 100 100 96 (!) 47  Resp: (!) 21 (!) 23 16 18   Temp:      TempSrc:      SpO2: 100% 98% 98% 100%  Weight:      Height:        General exam: Appears calm and comfortable. Ambulating in room with steady gait.  Respiratory system: Clear to auscultation. Respiratory effort normal. Cardiovascular system: S1 & S2 heard, RRR. No JVD,  rubs, gallops or clicks. No murmurs. Gastrointestinal system: Abdomen is nondistended, soft and nontender. No organomegaly or masses felt. Normal bowel sounds heard. Central nervous system: Alert and oriented. No focal neurological deficits. Extremities: No clubbing,  or cyanosis. No edema. Skin: No rashes, lesions or ulcers. Psychiatry: Judgement and insight appear normal. Mood & affect appropriate.  Neuro: alert oriented x3. Speech slow but clear. Bilateral grip 5/5 cranial nerve 2-12 intact   The results of significant diagnostics from this hospitalization (including imaging, microbiology, ancillary and laboratory) are listed below for reference.     Procedures and Diagnostic Studies:   CT HEAD WO CONTRAST  Result Date: 02/26/2020 CLINICAL DATA:  69 year old female with acute mental status change. Dizziness, slurred speech, weakness. EXAM: CT HEAD WITHOUT CONTRAST TECHNIQUE: Contiguous axial images were obtained from the base of the skull through the vertex without intravenous contrast. COMPARISON:  Head CT 12/29/2019. Brain MRI 09/09/2015. FINDINGS: Brain: Prominent chronic dystrophic calcifications again noted in the bilateral basal ganglia and deep cerebellar nuclei raising the possibility of Fahr Disease. No midline shift, ventriculomegaly, mass effect, evidence of mass lesion, intracranial hemorrhage or evidence of cortically based acute infarction. Gray-white matter differentiation is within normal limits throughout the  brain. Vascular: Calcified atherosclerosis at the skull base. No suspicious intracranial vascular hyperdensity. Skull: Stable, intact. Sinuses/Orbits: Visualized paranasal sinuses and mastoids are stable and well pneumatized. Other: No acute orbit or scalp soft tissue finding. IMPRESSION: 1. No acute intracranial abnormality. 2. Stable non contrast CT appearance of the brain with extensive dystrophic calcifications in the basal ganglia and deep cerebellar nuclei raising the possibility of Fahr Disease. Electronically Signed   By: Genevie Ann M.D.   On: 02/26/2020 00:25   MR ANGIO HEAD WO CONTRAST  Result Date: 02/26/2020 CLINICAL DATA:  TIA.  Acute onset confusion with difficulty speaking EXAM: MRI HEAD WITHOUT CONTRAST MRA HEAD WITHOUT CONTRAST TECHNIQUE: Multiplanar, multiecho pulse sequences of the brain and surrounding structures were obtained without intravenous contrast. Angiographic images of the head were obtained using MRA technique without contrast. COMPARISON:  Head CT from earlier today.  Brain MRI 09/09/2015 FINDINGS: MRI HEAD FINDINGS Brain: No acute infarction, hemorrhage, hydrocephalus, extra-axial collection or mass lesion. Age normal brain volume. Mild for age chronic small vessel ischemia in the white matter. Prominent calcification at the dentate nuclei and basal ganglia better seen by prior CT. A small white matter calcification is seen in the right frontal region. Vascular: Normal flow voids Skull and upper cervical spine: Normal marrow signal Sinuses/Orbits: Right cataract resection. 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 Mild right vertebral artery dominance. The vertebral and basilar arteries are  smooth and widely patent. No branch occlusion, beading, or aneurysm. Mild atheromatous changes along medium size vessels. IMPRESSION: Brain MRI: 1. No acute finding. 2. Mild chronic  small vessel ischemia. Intracranial MRA: 1. No emergent finding. 2. Atheromatous change without flow limiting stenosis. Electronically Signed   By: Monte Fantasia M.D.   On: 02/26/2020 07:11   MR BRAIN WO CONTRAST  Result Date: 02/26/2020 CLINICAL DATA:  TIA.  Acute onset confusion with difficulty speaking EXAM: MRI HEAD WITHOUT CONTRAST MRA HEAD WITHOUT CONTRAST TECHNIQUE: Multiplanar, multiecho pulse sequences of the brain and surrounding structures were obtained without intravenous contrast. Angiographic images of the head were obtained using MRA technique without contrast. COMPARISON:  Head CT from earlier today.  Brain MRI 09/09/2015 FINDINGS: MRI HEAD FINDINGS Brain: No acute infarction, hemorrhage, hydrocephalus, extra-axial collection or mass lesion. Age normal brain volume. Mild for age chronic small vessel ischemia in the white matter. Prominent calcification at the dentate nuclei and basal ganglia better seen by prior CT. A small white matter calcification is seen in the right frontal region. Vascular: Normal flow voids Skull and upper cervical spine: Normal marrow signal Sinuses/Orbits: Right cataract resection. 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 Mild right vertebral artery dominance. The vertebral and basilar arteries are smooth and widely patent. No branch occlusion, beading, or aneurysm. Mild atheromatous changes along medium size vessels. IMPRESSION: Brain MRI: 1. No acute finding. 2. Mild chronic small vessel ischemia. Intracranial MRA: 1. No emergent finding. 2. Atheromatous change without flow limiting stenosis. Electronically Signed   By: Monte Fantasia M.D.   On: 02/26/2020 07:11   DG Chest Port 1 View  Result Date: 02/25/2020 CLINICAL DATA:  69 year old female with altered mental status, confusion. EXAM: PORTABLE CHEST 1 VIEW COMPARISON:  Chest radiographs 09/23/2019  and earlier. FINDINGS: Portable AP upright view at 2345 hours. Lung volumes and mediastinal contours remain within normal limits. Visualized tracheal air column is within normal limits. Allowing for portable technique the lungs are clear. Chronic right rib fractures appear stable. No acute osseous abnormality identified. IMPRESSION: No acute cardiopulmonary abnormality. Chronic right rib fractures. Electronically Signed   By: Genevie Ann M.D.   On: 02/25/2020 23:58     Labs:   Basic Metabolic Panel: Recent Labs  Lab 02/25/20 2339 02/26/20 0619  NA 139 140  K 3.4* 3.7  CL 103 102  CO2 23 26  GLUCOSE 291* 221*  BUN 16 13  CREATININE 0.96 0.87  CALCIUM 8.9 9.3  MG  --  1.9  PHOS  --  4.2   GFR Estimated Creatinine Clearance: 62.4 mL/min (by C-G formula based on SCr of 0.87 mg/dL). Liver Function Tests: Recent Labs  Lab 02/25/20 2339  AST 18  ALT 14  ALKPHOS 50  BILITOT 0.6  PROT 5.6*  ALBUMIN 3.3*   No results for input(s): LIPASE, AMYLASE in the last 168 hours. Recent Labs  Lab 02/26/20 0619  AMMONIA 20   Coagulation profile Recent Labs  Lab 02/25/20 2339  INR 1.0    CBC: Recent Labs  Lab 02/25/20 2339 02/26/20 0619  WBC 3.5* 3.0*  NEUTROABS 2.2 1.5*  HGB 11.7* 12.3  HCT 35.3* 38.0  MCV 90.1 92.2  PLT 174 187   Cardiac Enzymes: Recent Labs  Lab 02/26/20 0619  CKTOTAL 37*   BNP: Invalid input(s): POCBNP CBG: Recent Labs  Lab 02/26/20 0818  GLUCAP 196*   D-Dimer No  results for input(s): DDIMER in the last 72 hours. Hgb A1c Recent Labs    02/26/20 0619  HGBA1C 6.9*   Lipid Profile No results for input(s): CHOL, HDL, LDLCALC, TRIG, CHOLHDL, LDLDIRECT in the last 72 hours. Thyroid function studies Recent Labs    02/26/20 0619  TSH 0.917   Anemia work up Recent Labs    02/26/20 0619  [id number] 171*   Microbiology Recent Results (from the past 240 hour(s))  SARS CORONAVIRUS 2 (TAT 6-24 HRS) Nasopharyngeal Nasopharyngeal Swab      Status: None   Collection Time: 02/26/20  4:07 AM   Specimen: Nasopharyngeal Swab  Result Value Ref Range Status   SARS Coronavirus 2 NEGATIVE NEGATIVE Final    Comment: (NOTE) SARS-CoV-2 target nucleic acids are NOT DETECTED. The SARS-CoV-2 RNA is generally detectable in upper and lower respiratory specimens during the acute phase of infection. Negative results do not preclude SARS-CoV-2 infection, do not rule out co-infections with other pathogens, and should not be used as the sole basis for treatment or other patient management decisions. Negative results must be combined with clinical observations, patient history, and epidemiological information. The expected result is Negative. Fact Sheet for Patients: SugarRoll.be Fact Sheet for Healthcare Providers: https://www.woods-mathews.com/ This test is not yet approved or cleared by the [place] FDA and  has been authorized for detection and/or diagnosis of SARS-CoV-2 by FDA under an Emergency Use Authorization (EUA). This EUA will remain  in effect (meaning this test can be used) for the duration of the COVID-19 declaration under Section 56 4(b)(1) of the Act, 21 U.S.C. section 360bbb-3(b)(1), unless the authorization is terminated or revoked sooner. Performed at [facility], [place] [address], [place], [place] [address]      Discharge Instructions:   Discharge Instructions    Call MD for:  severe uncontrolled pain   Complete by: As directed    Call MD for:  temperature >100.4   Complete by: As directed    Diet - low sodium heart healthy   Complete by: As directed    Discharge instructions   Complete by: As directed    Follow up with PCP 2-3 weeks to track B12 level Follow up with subspecialist as scheduled   Increase activity slowly   Complete by: As directed      Allergies as of 02/26/2020      Reactions   Acarbose    Atenolol    Stomach problems, chills    Atorvastatin    Cinnamon    Stomach pain   Crestor [rosuvastatin Calcium]    Glimepiride    Invokana [canagliflozin]    Januvia [sitagliptin]    Losartan Potassium    Metformin And Related Nausea And Vomiting, Other (See Comments)   Patient states that she has severe chills, headache and cramping additionally. Says blood sugar is uncontrolled because of this   Pravastatin    Sulfamethoxazole Other (See Comments)   REACTION: unspecified   Sulfa Antibiotics Rash, Other (See Comments)   Mother has told patient in the past not to take, she cannot recall there reaction      Medication List    STOP taking these medications   gabapentin 100 MG capsule Commonly known as: NEURONTIN     TAKE these medications   amitriptyline 25 MG tablet Commonly known as: ELAVIL Take 25-50 mg by mouth at bedtime.   aspirin 325 MG tablet Take 325 mg by mouth at bedtime.   bumetanide 1 MG tablet Commonly known  as: BUMEX Take 1 mg by mouth every morning.   cholecalciferol 25 MCG (1000 UNIT) tablet Commonly known as: VITAMIN D3 Take 1,000 Units by mouth daily.   levothyroxine 50 MCG tablet Commonly known as: SYNTHROID Take 50 mcg by mouth daily.            Durable Medical Equipment  (From admission, onward)         Start     Ordered   02/26/20 1115  For home use only DME Walker rolling  Once    Question Answer Comment  Walker: With 5 Inch Wheels   Patient needs a walker to treat with the following condition Muscular deconditioning      02/26/20 1114            Time coordinating discharge: 40 minutes  Signed:  Radene Gunning NP  [facility] 02/26/2020, 11:29 AM

## 2020-02-26 NOTE — ED Notes (Signed)
Lunch Tray Ordered @ 1059.  

## 2020-02-26 NOTE — ED Notes (Signed)
Family at bedside. 

## 2020-02-26 NOTE — H&P (Addendum)
History and Physical    [name] EVO:[id number] DOB: March 25, 1951 DOA: 02/25/2020  PCP: Hulan Fess, MD   Patient coming from: Home   Chief Complaint: Acute-onset confusion, difficulty speaking, heavy sensation, numbness, dizziness   HPI: [name] is a 69 y.o. female with medical history significant for diet-controlled diabetes mellitus, fibromyalgia, anxiety, migraines, leg swelling, hypothyroidism, chronic diarrhea with fecal incontinence, tremors, memory loss, and progressive extensive brain calcifications, now presenting to the emergency department with acute onset multiple symptoms including confusion, speech difficulty, numbness, heavy sensation, and dizziness.  Patient reports that she has been "really sick for the past year" with leg swelling, palpitations, numbness, headaches, diarrhea with incontinence, and memory problems, but had an acute onset of confusion and other aforementioned symptoms while she was seated and reading a book last night, and has improved but not yet back to baseline.  Currently in the ED, she continues to express difficulty finding the right words to use, feels as though she is confused, is experiencing numbness throughout all extremities, and a sense of heaviness as though a heavy blanket is lying over her.  She had been experiencing headaches for the past 2 days, but none now.  She denies any acute change in her vision or hearing, denies fevers, chills, or neck stiffness, and is not having any chest pain or palpitations currently.  She is followed by neurology, reports that she is scheduled to see a subspecialist at [facility] regarding her progressive brain calcifications, has seen the cardiology with long-term heart monitor that did not show any atrial fibrillation or sustained arrhythmia, and had echocardiogram recently with preserved EF.  She is being treated by GI for her chronic diarrhea.  ED Course: Upon arrival to the ED, patient is  found to be afebrile, saturating mid 90s on room air, and with stable blood pressure.  EKG features a sinus rhythm.  Chest x-ray negative for acute cardiopulmonary disease.  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  Chemistry panel notable for glucose of 291 and potassium 3.4.  CBC with WBC 3500 and slight normocytic anemia.  Urinalysis with glucosuria, UDS negative, and ethanol undetectable.  Patient was given oral potassium in the ED, MRI brain and MRA head were ordered but not yet performed, Covid PCR not yet resulted, and hospitalist consulted for admission.  Review of Systems:  All other systems reviewed and apart from HPI, are negative.  Past Medical History:  Diagnosis Date  . Allergy   . Asthma    Dianosed years ago-no meds at this time  . Cataract    Right eye removed-still has left cataract   . 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 Diabetes mellitus   . Dysrhythmia    "skips beats"  . Fibromyalgia 1980's  . Generalized anxiety disorder   . GERD (gastroesophageal reflux disease)    occ- will use Tums and Prolosec  prn   . Hyperlipemia   . Hypertension   . Hypothyroidism   . Irritable bowel syndrome 1980's  . Major depressive disorder   . Migraine headaches 1980's   On meds-well controlled  . 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  . Morbid obesity ([place])   . Pneumonia 2009   Several times over the past several years  . PONV (postoperative nausea and vomiting)   . Recurrent upper respiratory infection (URI)   . Sleep apnea  no cpap  . Subarachnoid hemorrhage ([place]) 2010  . Vaginal polyp    benign per pt    Past Surgical History:  Procedure Laterality Date  . CHOLECYSTECTOMY  1983  . COLONOSCOPY  2008   DB  . [place] OF UTERUS  1972  . HYSTEROSCOPY WITH D & C  06/29/2011   Procedure: DILATATION AND CURETTAGE  (D&C) /HYSTEROSCOPY;  Surgeon: Donnamae Jude, MD;  Location: [facility];  Service: Gynecology;  Laterality: N/A;     reports that she has never smoked. She has never used smokeless tobacco. She reports that she does not drink alcohol or use drugs.  Allergies  Allergen Reactions  . Acarbose   . Atenolol     Stomach problems, chills  . Atorvastatin   . Cinnamon     Stomach pain  . Crestor [Rosuvastatin Calcium]   . Glimepiride   . Invokana [Canagliflozin]   . Januvia [Sitagliptin]   . Losartan Potassium   . Metformin And Related Nausea And Vomiting and Other (See Comments)    Patient states that she has severe chills, headache and cramping additionally. Says blood sugar is uncontrolled because of this  . Pravastatin   . Sulfamethoxazole Other (See Comments)    REACTION: unspecified  . Sulfa Antibiotics Rash and Other (See Comments)    Mother has told patient in the past not to take, she cannot recall there reaction    Family History  Problem Relation Age of Onset  . Liver cancer Father        mets to liver   . Bladder Cancer Father   . Colon cancer Father        mets to colon   . Anesthesia problems Mother        hard to wake post op   . High blood pressure Other   . Diabetes Other   . Arthritis Other   . Anesthesia problems Son        stopped breathing post op   . Tremor Son   . Schizophrenia Daughter        Schizoaffective disorder  . Anesthesia problems Granddaughter        PONV  . Dementia Neg Hx   . Esophageal cancer Neg Hx   . Rectal cancer Neg Hx   . Stomach cancer Neg Hx   . Colon polyps Neg Hx      Prior to Admission medications   Medication Sig Start Date End Date Taking? Authorizing Provider  amitriptyline (ELAVIL) 25 MG tablet Take 25-50 mg by mouth at bedtime.    Yes [provider]  aspirin 325 MG tablet Take 325 mg by mouth at bedtime.   Yes [provider]  bumetanide (BUMEX) 1 MG tablet Take 1 mg by mouth every morning. 02/05/20   Yes [provider]  cholecalciferol (VITAMIN D3) 25 MCG (1000 UNIT) tablet Take 1,000 Units by mouth daily.   Yes [provider]  levothyroxine (SYNTHROID, LEVOTHROID) 50 MCG tablet Take 50 mcg by mouth daily.    Yes [provider]  gabapentin (NEURONTIN) 100 MG capsule Take 1 capsule (100 mg total) by mouth 3 (three) times daily. Patient not taking: Reported on 02/26/2020 01/05/20   Melvenia Beam, MD    Physical Exam: Vitals:   02/26/20 0200 02/26/20 0215 02/26/20 0330 02/26/20 0415  BP: 128/73 136/76  (!) 142/47  Pulse: 93 96 91 (!) 50  Resp: '17 19 16 19  ' Temp:  TempSrc:      SpO2: 96% 99% 99% 99%  Weight:      Height:         Constitutional: NAD, anxious   Eyes: PERTLA, lids and conjunctivae normal ENMT: Mucous membranes are moist. Posterior pharynx clear of any exudate or lesions.   Neck: normal, supple, no masses, no thyromegaly Respiratory: clear to auscultation bilaterally, no wheezing, no crackles. No accessory muscle use.  Cardiovascular: S1 & S2 heard, regular rate and rhythm. Trace pedal edema.  Abdomen: No distension, no tenderness, soft. Bowel sounds active.  Musculoskeletal: no clubbing / cyanosis. No joint deformity upper and lower extremities.   Skin: no significant rashes, lesions, ulcers. Warm, dry, well-perfused. Neurologic: CN 2-12 grossly intact. Mild dysarthria and expressive aphasia. Sensation to light touch intact, patellar DTRs normal. Strength 5/5 in all 4 limbs.   Psychiatric: Alert and oriented to person, place, and situation. Coperative.    Labs and Imaging on Admission: I have personally reviewed following labs and imaging studies  CBC: Recent Labs  Lab 02/25/20 2339  WBC 3.5*  NEUTROABS 2.2  HGB 11.7*  HCT 35.3*  MCV 90.1  PLT 425   Basic Metabolic Panel: Recent Labs  Lab 02/25/20 2339  NA 139  K 3.4*  CL 103  CO2 23  GLUCOSE 291*  BUN 16  CREATININE 0.96  CALCIUM 8.9   GFR: Estimated  Creatinine Clearance: 56.6 mL/min (by C-G formula based on SCr of 0.96 mg/dL). Liver Function Tests: Recent Labs  Lab 02/25/20 2339  AST 18  ALT 14  ALKPHOS 50  BILITOT 0.6  PROT 5.6*  ALBUMIN 3.3*   No results for input(s): LIPASE, AMYLASE in the last 168 hours. No results for input(s): AMMONIA in the last 168 hours. Coagulation Profile: Recent Labs  Lab 02/25/20 2339  INR 1.0   Cardiac Enzymes: No results for input(s): CKTOTAL, CKMB, CKMBINDEX, TROPONINI in the last 168 hours. BNP (last 3 results) No results for input(s): PROBNP in the last 8760 hours. HbA1C: No results for input(s): HGBA1C in the last 72 hours. CBG: No results for input(s): GLUCAP in the last 168 hours. Lipid Profile: No results for input(s): CHOL, HDL, LDLCALC, TRIG, CHOLHDL, LDLDIRECT in the last 72 hours. Thyroid Function Tests: No results for input(s): TSH, T4TOTAL, FREET4, T3FREE, THYROIDAB in the last 72 hours. Anemia Panel: No results for input(s): VITAMINB12, FOLATE, FERRITIN, TIBC, IRON, RETICCTPCT in the last 72 hours. Urine analysis:    Component Value Date/Time   COLORURINE YELLOW 02/26/2020 0239   APPEARANCEUR CLEAR 02/26/2020 0239   LABSPEC 1.012 02/26/2020 0239   PHURINE 6.0 02/26/2020 0239   GLUCOSEU >=500 (A) 02/26/2020 0239   HGBUR NEGATIVE 02/26/2020 0239   BILIRUBINUR NEGATIVE 02/26/2020 0239   KETONESUR NEGATIVE 02/26/2020 0239   PROTEINUR NEGATIVE 02/26/2020 0239   UROBILINOGEN 0.2 05/15/2011 1809   NITRITE NEGATIVE 02/26/2020 0239   LEUKOCYTESUR SMALL (A) 02/26/2020 0239   Sepsis Labs: '@LABRCNTIP' (procalcitonin:4,lacticidven:4) )No results found for this or any previous visit (from the past 240 hour(s)).   Radiological Exams on Admission: CT HEAD WO CONTRAST  Result Date: 02/26/2020 CLINICAL DATA:  69 year old female with acute mental status change. Dizziness, slurred speech, weakness. EXAM: CT HEAD WITHOUT CONTRAST TECHNIQUE: Contiguous axial images were obtained from  the base of the skull through the vertex without intravenous contrast. COMPARISON:  Head CT 12/29/2019. Brain MRI 09/09/2015. FINDINGS: Brain: Prominent chronic dystrophic calcifications again noted in the bilateral basal ganglia and deep cerebellar nuclei raising the possibility of Fahr  Disease. No midline shift, ventriculomegaly, mass effect, evidence of mass lesion, intracranial hemorrhage or evidence of cortically based acute infarction. Gray-white matter differentiation is within normal limits throughout the brain. Vascular: Calcified atherosclerosis at the skull base. No suspicious intracranial vascular hyperdensity. Skull: Stable, intact. Sinuses/Orbits: Visualized paranasal sinuses and mastoids are stable and well pneumatized. Other: No acute orbit or scalp soft tissue finding. IMPRESSION: 1. No acute intracranial abnormality. 2. Stable non contrast CT appearance of the brain with extensive dystrophic calcifications in the basal ganglia and deep cerebellar nuclei raising the possibility of Fahr Disease. Electronically Signed   By: Genevie Ann M.D.   On: 02/26/2020 00:25   DG Chest Port 1 View  Result Date: 02/25/2020 CLINICAL DATA:  69 year old female with altered mental status, confusion. EXAM: PORTABLE CHEST 1 VIEW COMPARISON:  Chest radiographs 09/23/2019 and earlier. FINDINGS: Portable AP upright view at 2345 hours. Lung volumes and mediastinal contours remain within normal limits. Visualized tracheal air column is within normal limits. Allowing for portable technique the lungs are clear. Chronic right rib fractures appear stable. No acute osseous abnormality identified. IMPRESSION: No acute cardiopulmonary abnormality. Chronic right rib fractures. Electronically Signed   By: Genevie Ann M.D.   On: 02/25/2020 23:58    Assessment/Plan   1. Acute encephalopathy  - Presents with acute-onset confusion and other non-focal neurologic symptoms  - No acute findings on head CT which is notable for extensive  calcifications for which she is undergoing outpatient workup  - UDS and EtOH negative, no fevers or meningismus, pt alert with no seizure-like activity  - Check MRI brain, TSH, B12, RPR, ammonia, ESR - Continue supportive care    2. Type II DM  - Reports diet-control, no A1c in EMR  - Serum glucose 291, will check A1c and use low-intensity SSI with Novolog   3. Migraines  - Continue amitriptyline   4. Hypothyroidism  - Check TSH as above  - Continue Synthroid    DVT prophylaxis: Lovenox  Code Status: Full  Family Communication: Discussed with patient   Disposition Plan: Likely back home pending AMS workup and therapy assessment  Consults called: None  Admission status: Observation     Vianne Bulls, MD [facility] Pager: See [email or web address]  If 7AM-7PM, please contact the daytime attending [email or web address]  02/26/2020, 4:34 AM

## 2020-02-26 NOTE — ED Notes (Signed)
Pt transported to CT ?

## 2020-03-04 ENCOUNTER — Telehealth: Payer: Self-pay | Admitting: Gastroenterology

## 2020-03-04 NOTE — Telephone Encounter (Signed)
Spoke with the patient. She reports 50 % to 75 % improvement while on Xifaxan. Her diarrhea returned as before about 24 hours later upon completion of medication. States she feels she had the most improvement when she was on her grandchild's antibiotics given for an ear infection. She cannot recall the exact name of the antibiotic, but thinks it was Ampicillin or Amoxicillin. Please advise on the next step. Thanks

## 2020-03-04 NOTE — Telephone Encounter (Signed)
Called the listed phone number. No answer. Left a message to call back.

## 2020-03-04 NOTE — Telephone Encounter (Signed)
I recommend holding off additional antibiotics, at risk for C.diff infection.  Use Benefiber 1 tablespoon TID with meals to help with fecal incontinence and bulk stool Follow up in office visit, next available appointment. Thanks

## 2020-03-07 NOTE — Progress Notes (Signed)
Cardiology Office Note   Date:  03/08/2020   ID:  Isabella, [name] 28-Mar-1951, MRN [id number]  PCP:  Isabella Fess, MD    No chief complaint on file.  Palpitations, chest pain  Wt Readings from Last 3 Encounters:  03/08/20 187 lb (84.8 kg)  02/25/20 181 lb (82.1 kg)  01/18/20 178 lb (80.7 kg)       History of Present Illness: [name] is a 69 y.o. female who has had a complex medical history including issues with falls and a brain calcification.  She was worked up for Parkinson's due to a fall and tremor.  She is following up with neurology.  She noted in August 2020 that she was retaining some fluid in her legs.  She tried furosemide but this did not help much.  She also noted that her blood pressure cuff was giving her a "heart symbol."  She was feeling some palpitations.  She had some random, atypical chest pain as well.  She has been allergic to several blood pressure and diabetes medications.   In January 2021, monitor showed:NSR with occasional PACs and PVCs.  Rare short bursts of tachycardia lasting only seconds.  No sustained arrhythmias. No atrial fibrillation.  Since the last visit, she had an episode of stroke like sx.  She was taken to [facility].  She was diagnosed with low potassium, B12.     Past Medical History:  Diagnosis Date  . Allergy   . Asthma    Dianosed years ago-no meds at this time  . Cataract    Right eye removed-still has left cataract   . 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 Diabetes mellitus   . Dysrhythmia    "skips beats"  . Fibromyalgia 1980's  . Generalized anxiety disorder   . GERD (gastroesophageal reflux disease)    occ- will use Tums and Prolosec  prn   . Hyperlipemia   . Hypertension   . Hypothyroidism   . Irritable bowel syndrome 1980's  . Major depressive disorder   . Migraine headaches 1980's   On meds-well controlled  . 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  . Morbid obesity ([place])   . Pneumonia 2009   Several times over the past several years  . PONV (postoperative nausea and vomiting)   . Recurrent upper respiratory infection (URI)   . Sleep apnea    no cpap  . Subarachnoid hemorrhage ([place]) 2010  . Vaginal polyp    benign per pt    Past Surgical History:  Procedure Laterality Date  . CHOLECYSTECTOMY  1983  . COLONOSCOPY  2008   DB  . [place] OF UTERUS  1972  . HYSTEROSCOPY WITH D & C  06/29/2011   Procedure: DILATATION AND CURETTAGE (D&C) /HYSTEROSCOPY;  Surgeon: Donnamae Jude, MD;  Location: [facility];  Service: Gynecology;  Laterality: N/A;     Current Outpatient Medications  Medication Sig Dispense Refill  . amitriptyline (ELAVIL) 25 MG tablet Take 25-50 mg by mouth at bedtime.     Marland Kitchen aspirin 325 MG tablet Take 325 mg by mouth at bedtime.    . bumetanide (BUMEX) 1 MG tablet Take 1 mg by mouth every morning.    . cholecalciferol (VITAMIN D3) 25 MCG (1000 UNIT) tablet Take 1,000 Units by mouth daily.    . Cyanocobalamin (VITAMIN B 12 PO) Take 1 tablet by mouth daily.    Marland Kitchen levothyroxine (  SYNTHROID, LEVOTHROID) 50 MCG tablet Take 50 mcg by mouth daily.     . Potassium (POTASSIMIN PO) Take 99 mg by mouth daily.     Current Facility-Administered Medications  Medication Dose Route Frequency Provider Last Rate Last Admin  . 0.9 %  sodium chloride infusion  500 mL Intravenous Once Nandigam, Kavitha V, MD        Allergies:   Acarbose, Atenolol, Atorvastatin, Cinnamon, Crestor [rosuvastatin calcium], Glimepiride, Invokana [canagliflozin], Januvia [sitagliptin], Losartan potassium, Metformin and related, Pravastatin, Sulfamethoxazole, and Sulfa antibiotics    Social History:  The patient  reports that she has never smoked. She has never used smokeless tobacco. She reports that she does not drink alcohol or use drugs.   Family History:  The patient's family history includes Anesthesia problems in her  granddaughter, mother, and son; Arthritis in an other family member; Bladder Cancer in her father; Colon cancer in her father; Diabetes in an other family member; High blood pressure in an other family member; Liver cancer in her father; Schizophrenia in her daughter; Tremor in her son.    ROS:  Please see the history of present illness.   Otherwise, review of systems are positive for persistent diarrhea.   All other systems are reviewed and negative.    PHYSICAL EXAM: VS:  BP 132/74   Pulse 92   Ht 5\' 4"  (1.626 m)   Wt 187 lb (84.8 kg)   SpO2 98%   BMI 32.10 kg/m  , BMI Body mass index is 32.1 kg/m. GEN: Well nourished, well developed, in no acute distress  HEENT: normal  Neck: no JVD, carotid bruits, or masses Cardiac: RRR; no murmurs, rubs, or gallops,no edema  Respiratory:  clear to auscultation bilaterally, normal work of breathing GI: soft, nontender, nondistended, + BS MS: no deformity or atrophy  Skin: warm and dry, no rash Neuro:  Strength and sensation are intact Psych: euthymic mood, full affect   EKG:   The ekg ordered on 02/26/20 demonstrates NSR, anterior T wave inversions.   Recent Labs: 02/25/2020: ALT 14 02/26/2020: BUN 13; Creatinine, Ser 0.87; Hemoglobin 12.3; Magnesium 1.9; Platelets 187; Potassium 3.7; Sodium 140; TSH 0.917   Lipid Panel No results found for: CHOL, TRIG, HDL, CHOLHDL, VLDL, LDLCALC, LDLDIRECT   Other studies Reviewed: Additional studies/ records that were reviewed today with results demonstrating: December 2020 echo showed normal LV function with normal valvular function.     ASSESSMENT AND PLAN:  1. Palpitations: She had PACs and PVCs noted.  No sustained arrhythmias.  No atrial fibrillation.   2. Diabetes: It has been poorly controlled in the past.  A1C 6.9 more recently. 3. Fluid retention: This was thought to be due to venous insufficiency.  Compression stockings and leg elevation were recommended.  She has tried Lasix.  Now on  Bumex.  Fluid retention is not from systolic heart failure.   4. Precordial chest pain: Negative troponins in Jan 2021.   It can come on at rest or with exertion. There have been some anterior T wave changes on the ECGs from 02/2020- suggestive of anterior ischemia.  Will plan for CTA coronaries.   Current medicines are reviewed at length with the patient today.  The patient concerns regarding her medicines were addressed.  The following changes have been made:  No change  Labs/ tests ordered today include: CTA coronaries No orders of the defined types were placed in this encounter.   Recommend 150 minutes/week of aerobic exercise Low fat, low carb,  high fiber diet recommended  Disposition:   FU in based on CT scan   Signed, Larae Grooms, MD  03/08/2020 1:45 PM    [facility] [place], [place], [place]  [address] Phone: [phone]; Fax: [phone]

## 2020-03-07 NOTE — Telephone Encounter (Signed)
Patient advised of the recommendations. Discussed in detail what C Diff is and can do. Scheduled a follow up appointment 03/29/20 at [facility].

## 2020-03-08 ENCOUNTER — Other Ambulatory Visit: Payer: Self-pay

## 2020-03-08 ENCOUNTER — Ambulatory Visit (INDEPENDENT_AMBULATORY_CARE_PROVIDER_SITE_OTHER): Payer: Medicare HMO | Admitting: Interventional Cardiology

## 2020-03-08 ENCOUNTER — Encounter: Payer: Self-pay | Admitting: Interventional Cardiology

## 2020-03-08 VITALS — BP 132/74 | HR 92 | Ht 64.0 in | Wt 187.0 lb

## 2020-03-08 DIAGNOSIS — I872 Venous insufficiency (chronic) (peripheral): Secondary | ICD-10-CM | POA: Diagnosis not present

## 2020-03-08 DIAGNOSIS — E039 Hypothyroidism, unspecified: Secondary | ICD-10-CM | POA: Diagnosis not present

## 2020-03-08 DIAGNOSIS — R072 Precordial pain: Secondary | ICD-10-CM

## 2020-03-08 DIAGNOSIS — E1165 Type 2 diabetes mellitus with hyperglycemia: Secondary | ICD-10-CM | POA: Diagnosis not present

## 2020-03-08 DIAGNOSIS — D510 Vitamin B12 deficiency anemia due to intrinsic factor deficiency: Secondary | ICD-10-CM | POA: Diagnosis not present

## 2020-03-08 DIAGNOSIS — I10 Essential (primary) hypertension: Secondary | ICD-10-CM | POA: Diagnosis not present

## 2020-03-08 DIAGNOSIS — E1149 Type 2 diabetes mellitus with other diabetic neurological complication: Secondary | ICD-10-CM | POA: Diagnosis not present

## 2020-03-08 DIAGNOSIS — E1129 Type 2 diabetes mellitus with other diabetic kidney complication: Secondary | ICD-10-CM | POA: Diagnosis not present

## 2020-03-08 DIAGNOSIS — E782 Mixed hyperlipidemia: Secondary | ICD-10-CM | POA: Diagnosis not present

## 2020-03-08 DIAGNOSIS — N189 Chronic kidney disease, unspecified: Secondary | ICD-10-CM | POA: Diagnosis not present

## 2020-03-08 DIAGNOSIS — R002 Palpitations: Secondary | ICD-10-CM | POA: Diagnosis not present

## 2020-03-08 DIAGNOSIS — E1122 Type 2 diabetes mellitus with diabetic chronic kidney disease: Secondary | ICD-10-CM | POA: Diagnosis not present

## 2020-03-08 DIAGNOSIS — E785 Hyperlipidemia, unspecified: Secondary | ICD-10-CM | POA: Diagnosis not present

## 2020-03-08 MED ORDER — METOPROLOL TARTRATE 100 MG PO TABS: ORAL_TABLET | ORAL | 0 refills | Status: DC

## 2020-03-08 NOTE — Patient Instructions (Addendum)
Medication Instructions:  Your physician recommends that you continue on your current medications as directed. Please refer to the Current Medication list given to you today.  *If you need a refill on your cardiac medications before your next appointment, please call your pharmacy*   Lab Work: None today  If you have labs (blood work) drawn today and your tests are completely normal, you will receive your results only by: Marland Kitchen MyChart Message (if you have MyChart) OR . A paper copy in the mail If you have any lab test that is abnormal or we need to change your treatment, we will call you to review the results.   Testing/Procedures: Your physician has requested that you have cardiac CT. Cardiac computed tomography (CT) is a painless test that uses an x-ray machine to take clear, detailed pictures of your heart. For further information please visit [email or web address][facility][email or web address]. Please follow instruction sheet as given.   Follow-Up: Based on test results   Other Instructions  Your cardiac CT will be scheduled at one of the below locations:   [facility] [address] [place], [place] [address] [phone]  [facility] [address] [place],  [address] [phone]  If scheduled at [facility], please arrive at the [facility] main entrance of [facility] 30 minutes prior to test start time. Proceed to the [facility] Radiology Department (first floor) to check-in and test prep.  If scheduled at [facility], please arrive 15 mins early for check-in and test prep.  Please follow these instructions carefully (unless otherwise directed):   On the Night Before the Test: . Be sure to Drink plenty of water. . Do not consume any caffeinated/decaffeinated beverages or chocolate 12 hours prior to your test. . Do not take any antihistamines 12 hours prior to your test.  On the  Day of the Test: . Drink plenty of water. Do not drink any water within one hour of the test. . Do not eat any food 4 hours prior to the test. . You may take your regular medications prior to the test.  . Take metoprolol (Lopressor) 100 MG two hours prior to test. . HOLD bumex morning of the test. . FEMALES- please wear underwire-free bra if available       After the Test: . Drink plenty of water. . After receiving IV contrast, you may experience a mild flushed feeling. This is normal. . On occasion, you may experience a mild rash up to 24 hours after the test. This is not dangerous. If this occurs, you can take Benadryl 25 mg and increase your fluid intake. . If you experience trouble breathing, this can be serious. If it is severe call 911 IMMEDIATELY. If it is mild, please call our office.   Once we have confirmed authorization from your insurance company, we will call you to set up a date and time for your test.   For non-scheduling related questions, please contact the cardiac imaging nurse navigator should you have any questions/concerns: Marchia Bond, RN Navigator Cardiac Imaging [facility] [phone] office  For scheduling needs, including cancellations and rescheduling, please call [phone].

## 2020-03-11 DIAGNOSIS — E538 Deficiency of other specified B group vitamins: Secondary | ICD-10-CM | POA: Diagnosis not present

## 2020-03-11 DIAGNOSIS — N189 Chronic kidney disease, unspecified: Secondary | ICD-10-CM | POA: Diagnosis not present

## 2020-03-11 DIAGNOSIS — R197 Diarrhea, unspecified: Secondary | ICD-10-CM | POA: Diagnosis not present

## 2020-03-11 DIAGNOSIS — G238 Other specified degenerative diseases of basal ganglia: Secondary | ICD-10-CM | POA: Diagnosis not present

## 2020-03-11 DIAGNOSIS — D649 Anemia, unspecified: Secondary | ICD-10-CM | POA: Diagnosis not present

## 2020-03-22 DIAGNOSIS — E039 Hypothyroidism, unspecified: Secondary | ICD-10-CM | POA: Diagnosis not present

## 2020-03-22 DIAGNOSIS — E782 Mixed hyperlipidemia: Secondary | ICD-10-CM | POA: Diagnosis not present

## 2020-03-22 DIAGNOSIS — I10 Essential (primary) hypertension: Secondary | ICD-10-CM | POA: Diagnosis not present

## 2020-03-22 DIAGNOSIS — E785 Hyperlipidemia, unspecified: Secondary | ICD-10-CM | POA: Diagnosis not present

## 2020-03-22 DIAGNOSIS — E1165 Type 2 diabetes mellitus with hyperglycemia: Secondary | ICD-10-CM | POA: Diagnosis not present

## 2020-03-22 DIAGNOSIS — N189 Chronic kidney disease, unspecified: Secondary | ICD-10-CM | POA: Diagnosis not present

## 2020-03-22 DIAGNOSIS — E1129 Type 2 diabetes mellitus with other diabetic kidney complication: Secondary | ICD-10-CM | POA: Diagnosis not present

## 2020-03-22 DIAGNOSIS — D510 Vitamin B12 deficiency anemia due to intrinsic factor deficiency: Secondary | ICD-10-CM | POA: Diagnosis not present

## 2020-03-22 DIAGNOSIS — E1122 Type 2 diabetes mellitus with diabetic chronic kidney disease: Secondary | ICD-10-CM | POA: Diagnosis not present

## 2020-03-23 DIAGNOSIS — F329 Major depressive disorder, single episode, unspecified: Secondary | ICD-10-CM | POA: Diagnosis not present

## 2020-03-23 DIAGNOSIS — F411 Generalized anxiety disorder: Secondary | ICD-10-CM | POA: Diagnosis not present

## 2020-03-25 DIAGNOSIS — D510 Vitamin B12 deficiency anemia due to intrinsic factor deficiency: Secondary | ICD-10-CM | POA: Diagnosis not present

## 2020-03-29 ENCOUNTER — Ambulatory Visit (INDEPENDENT_AMBULATORY_CARE_PROVIDER_SITE_OTHER): Payer: Medicare HMO | Admitting: Gastroenterology

## 2020-03-29 ENCOUNTER — Encounter: Payer: Self-pay | Admitting: Gastroenterology

## 2020-03-29 VITALS — BP 136/72 | HR 84 | Temp 97.3°F | Ht 62.5 in | Wt 188.4 lb

## 2020-03-29 DIAGNOSIS — K580 Irritable bowel syndrome with diarrhea: Secondary | ICD-10-CM | POA: Diagnosis not present

## 2020-03-29 DIAGNOSIS — E538 Deficiency of other specified B group vitamins: Secondary | ICD-10-CM

## 2020-03-29 MED ORDER — RIFAXIMIN 550 MG PO TABS: 550.0000 mg | ORAL_TABLET | Freq: Three times a day (TID) | ORAL | 0 refills | Status: DC

## 2020-03-29 NOTE — Patient Instructions (Signed)
Please follow- Low fodmap diet.   Keep food diary.   B12 injections have been recommended by [name]  Weekly x4, then once monthly thereafter. You have requested this to be done at your primary care office.   If you are age 71 or older, your body mass index should be between 23-30. Your Body mass index is 33.91 kg/m. If this is out of the aforementioned range listed, please consider follow up with your Primary Care Provider.  If you are age 61 or younger, your body mass index should be between 19-25. Your Body mass index is 33.91 kg/m. If this is out of the aformentioned range listed, please consider follow up with your Primary Care Provider.   We have sent the following medications to your pharmacy for you to pick up at your convenience: Xifaxan - sent to [facility]   Please keep follow-up appointment in 2-3 months.   Thank you for choosing me and [facility].  [name]

## 2020-03-29 NOTE — Progress Notes (Signed)
[name]    [id number]    Feb 28, 1951  Primary Care Physician:Little, Lennette Bihari, MD  Referring Physician: Hulan Fess, MD [place],  [place] [address]   Chief complaint: Chronic diarrhea, irritable bowel syndrome HPI:  69 year old female here for follow-up visit for chronic diarrhea  Rifaximin helped improve symptoms of diarrhea to about 60-70% better. She wants it 100% better Started B12 injections monthly for B12 deficiency.  She felt better for about 45 days after B12 injection but started feeling her symptoms recur and is having fatigue and also imbalance.  Denies any nausea, vomiting, abdominal pain, melena or bright red blood per rectum  Continues to have intermittent fecal urgency and incontinence  Colonoscopy June 03, 2018: 2 sessile polyps removed from transverse and descending colon, one of which was a tubular adenoma.  Colonic diverticulosis and internal hemorrhoids.  She is waiting for appointment to be seen at [facility] for evaluation for [facility]  Outpatient Encounter Medications as of 03/29/2020  Medication Sig  . amitriptyline (ELAVIL) 25 MG tablet Take 25-50 mg by mouth at bedtime.   Marland Kitchen aspirin 325 MG tablet Take 325 mg by mouth at bedtime.  . bumetanide (BUMEX) 1 MG tablet Take 1 mg by mouth every morning.  . busPIRone (BUSPAR) 7.5 MG tablet   . cholecalciferol (VITAMIN D3) 25 MCG (1000 UNIT) tablet Take 1,000 Units by mouth daily.  . Cyanocobalamin (B-12 COMPLIANCE INJECTION) 1000 MCG/ML KIT Inject as directed every 30 (thirty) days.  Marland Kitchen levothyroxine (SYNTHROID, LEVOTHROID) 50 MCG tablet Take 50 mcg by mouth daily.   . Potassium (POTASSIMIN PO) Take 99 mg by mouth daily.  . metoprolol tartrate (LOPRESSOR) 100 MG tablet Take 1 tablet by mouth 2 hours prior to Cardiac CT  . [DISCONTINUED] Cyanocobalamin (VITAMIN B 12 PO) Take 1 tablet by mouth daily.  . [DISCONTINUED] 0.9 %  sodium chloride infusion     No facility-administered encounter medications on file as of 03/29/2020.    Allergies as of 03/29/2020 - Review Complete 03/29/2020  Allergen Reaction Noted  . Acarbose  05/20/2018  . Atenolol  09/29/2019  . Atorvastatin  05/20/2018  . Cinnamon  06/15/2015  . Crestor [rosuvastatin calcium]  05/20/2018  . Glimepiride  05/20/2018  . Invokana [canagliflozin]  05/20/2018  . Januvia [sitagliptin]  05/20/2018  . Losartan potassium  05/20/2018  . Metformin and related Nausea And Vomiting and Other (See Comments) 06/26/2011  . Pravastatin  05/20/2018  . Sulfamethoxazole Other (See Comments) 03/24/2007  . Sulfa antibiotics Rash and Other (See Comments) 06/16/2011    Past Medical History:  Diagnosis Date  . Allergy   . Anemia   . Asthma    Dianosed years ago-no meds at this time  . Cataract    Right eye removed-still has left cataract   . 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 Diabetes mellitus   . Dysrhythmia    "skips beats"  . Fibromyalgia 1980's  . Generalized anxiety disorder   . GERD (gastroesophageal reflux disease)    occ- will use Tums and Prolosec  prn   . Hyperlipemia   . Hypertension   . Hypothyroidism   . Irritable bowel syndrome 1980's  . Major depressive disorder   . Migraine headaches 1980's   On meds-well controlled  . 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  . Morbid obesity ([place])   . Pneumonia 2009  Several times over the past several years  . PONV (postoperative nausea and vomiting)   . Recurrent upper respiratory infection (URI)   . Sleep apnea    no cpap  . Subarachnoid hemorrhage ([place]) 2010  . Vaginal polyp    benign per pt    Past Surgical History:  Procedure Laterality Date  . CHOLECYSTECTOMY  1983  . COLONOSCOPY  2008   DB  . [place] OF UTERUS  1972  . HYSTEROSCOPY WITH D & C  06/29/2011   Procedure: DILATATION AND CURETTAGE (D&C) /HYSTEROSCOPY;  Surgeon: Donnamae Jude, MD;  Location: [facility];  Service: Gynecology;  Laterality: N/A;    Family History  Problem Relation Age of Onset  . Liver cancer Father        mets to liver   . Bladder Cancer Father   . Colon cancer Father        mets to colon   . Anesthesia problems Mother        hard to wake post op   . High blood pressure Other   . Diabetes Other   . Arthritis Other   . Anesthesia problems Son        stopped breathing post op   . Tremor Son   . Schizophrenia Daughter        Schizoaffective disorder  . Anesthesia problems Granddaughter        PONV  . Dementia Neg Hx   . Esophageal cancer Neg Hx   . Rectal cancer Neg Hx   . Stomach cancer Neg Hx   . Colon polyps Neg Hx     Social History   Socioeconomic History  . Marital status: Legally Separated    Spouse name: Not on file  . Number of children: 3  . Years of education: 68  . Highest education level: Not on file  Occupational History  . Occupation: Retired   Tobacco Use  . Smoking status: Never Smoker  . Smokeless tobacco: Never Used  Substance and Sexual Activity  . Alcohol use: No    Comment: quit 1978  . Drug use: No  . Sexual activity: Not Currently  Other Topics Concern  . Not on file  Social History Narrative   Lives at home. Her granddaughter lives with her.    Right handed   Social Determinants of Health   Financial Resource Strain:   . Difficulty of Paying Living Expenses:   Food Insecurity:   . Worried About Charity fundraiser in the Last Year:   . Arboriculturist in the Last Year:   Transportation Needs:   . Film/video editor (Medical):   Marland Kitchen Lack of Transportation (Non-Medical):   Physical Activity:   . Days of Exercise per Week:   . Minutes of Exercise per Session:   Stress:   . Feeling of Stress :   Social Connections:   . Frequency of Communication with Friends and Family:   . Frequency of Social Gatherings with Friends and Family:   . Attends Religious Services:   . Active Member of  Clubs or Organizations:   . Attends Archivist Meetings:   Marland Kitchen Marital Status:   Intimate Partner Violence:   . Fear of Current or Ex-Partner:   . Emotionally Abused:   Marland Kitchen Physically Abused:   . Sexually Abused:       Review of systems: All other review of systems negative except as mentioned in the HPI.  Physical Exam: Vitals:   03/29/20 1504  BP: 136/72  Pulse: 84  Temp: (!) 97.3 F (36.3 C)   Body mass index is 33.91 kg/m. Gen:      No acute distress Neuro: alert and oriented x 3 Psych: normal mood and affect  Data Reviewed:  Reviewed labs, radiology imaging, old records and pertinent past GI work up   Assessment and Plan/Recommendations:  69 year old female with chronic diarrhea likely irritable bowel syndrome predominant diarrhea/small intestinal bacterial overgrowth  Some improvement of symptoms with course of rifaximin, repeat rifaximin 550 mg 3 times daily for additional 14 days Trial of low FODMAP diet Advised patient to maintain food symptom diary  Vitamin B12 deficiency with significantly low level 171 Advised patient to increase the frequency of vitamin B12 injections to weekly X4 to build the stores and subsequently continue monthly injections for maintenance.  She prefers to get injections through her PMD, will contact their office.  Return in 2 to 3 months or sooner if needed  This visit required 35 minutes of patient care (this includes precharting, chart review, review of results, face-to-face time used for counseling as well as treatment plan and follow-up. The patient was provided an opportunity to ask questions and all were answered. The patient agreed with the plan and demonstrated an understanding of the instructions.  Damaris Hippo , MD    CC: Hulan Fess, MD

## 2020-03-30 ENCOUNTER — Encounter: Payer: Self-pay | Admitting: Gastroenterology

## 2020-04-04 DIAGNOSIS — D510 Vitamin B12 deficiency anemia due to intrinsic factor deficiency: Secondary | ICD-10-CM | POA: Diagnosis not present

## 2020-04-05 ENCOUNTER — Telehealth: Payer: Self-pay | Admitting: Gastroenterology

## 2020-04-05 DIAGNOSIS — R110 Nausea: Secondary | ICD-10-CM | POA: Diagnosis not present

## 2020-04-05 DIAGNOSIS — R5382 Chronic fatigue, unspecified: Secondary | ICD-10-CM | POA: Diagnosis not present

## 2020-04-05 DIAGNOSIS — Z8639 Personal history of other endocrine, nutritional and metabolic disease: Secondary | ICD-10-CM | POA: Diagnosis not present

## 2020-04-05 MED ORDER — RIFAXIMIN 550 MG PO TABS: 550.0000 mg | ORAL_TABLET | Freq: Three times a day (TID) | ORAL | 0 refills | Status: DC

## 2020-04-05 NOTE — Telephone Encounter (Signed)
Rx for Xifaxan sent to [facility] on [address]- at pt's request. Pt has been informed.

## 2020-04-11 DIAGNOSIS — D510 Vitamin B12 deficiency anemia due to intrinsic factor deficiency: Secondary | ICD-10-CM | POA: Diagnosis not present

## 2020-04-12 ENCOUNTER — Other Ambulatory Visit: Payer: Self-pay

## 2020-04-12 ENCOUNTER — Other Ambulatory Visit: Payer: Medicare HMO

## 2020-04-12 DIAGNOSIS — I872 Venous insufficiency (chronic) (peripheral): Secondary | ICD-10-CM

## 2020-04-12 DIAGNOSIS — E1149 Type 2 diabetes mellitus with other diabetic neurological complication: Secondary | ICD-10-CM | POA: Diagnosis not present

## 2020-04-12 DIAGNOSIS — R002 Palpitations: Secondary | ICD-10-CM | POA: Diagnosis not present

## 2020-04-12 DIAGNOSIS — R072 Precordial pain: Secondary | ICD-10-CM

## 2020-04-13 LAB — BASIC METABOLIC PANEL
BUN/Creatinine Ratio: 22 (ref 12–28)
BUN: 20 mg/dL (ref 8–27)
CO2: 26 mmol/L (ref 20–29)
Calcium: 9.2 mg/dL (ref 8.7–10.3)
Chloride: 101 mmol/L (ref 96–106)
Creatinine, Ser: 0.92 mg/dL (ref 0.57–1.00)
GFR calc Af Amer: 73 mL/min/{1.73_m2} (ref >59)
GFR calc non Af Amer: 63 mL/min/{1.73_m2} (ref >59)
Glucose: 316 mg/dL — ABNORMAL HIGH (ref 65–99)
Potassium: 4.1 mmol/L (ref 3.5–5.2)
Sodium: 141 mmol/L (ref 134–144)

## 2020-04-14 ENCOUNTER — Telehealth (HOSPITAL_COMMUNITY): Payer: Self-pay | Admitting: Emergency Medicine

## 2020-04-14 NOTE — Telephone Encounter (Signed)
Reaching out to patient to offer assistance regarding upcoming cardiac imaging study; pt verbalizes understanding of appt date/time, parking situation and where to check in, pre-test NPO status and medications ordered, and verified current allergies; name and call back number provided for further questions should they arise Marilou Barnfield RN Navigator Cardiac Imaging [facility] [phone] office [phone] cell 

## 2020-04-15 ENCOUNTER — Ambulatory Visit (HOSPITAL_COMMUNITY): Admission: RE | Admit: 2020-04-15 | Payer: Medicare HMO | Source: Ambulatory Visit

## 2020-04-15 DIAGNOSIS — R4189 Other symptoms and signs involving cognitive functions and awareness: Secondary | ICD-10-CM | POA: Diagnosis not present

## 2020-04-15 DIAGNOSIS — R269 Unspecified abnormalities of gait and mobility: Secondary | ICD-10-CM | POA: Diagnosis not present

## 2020-04-15 DIAGNOSIS — R251 Tremor, unspecified: Secondary | ICD-10-CM | POA: Diagnosis not present

## 2020-04-15 DIAGNOSIS — Z9181 History of falling: Secondary | ICD-10-CM | POA: Diagnosis not present

## 2020-04-15 DIAGNOSIS — G250 Essential tremor: Secondary | ICD-10-CM | POA: Diagnosis not present

## 2020-04-15 DIAGNOSIS — H9190 Unspecified hearing loss, unspecified ear: Secondary | ICD-10-CM | POA: Diagnosis not present

## 2020-04-15 DIAGNOSIS — R2689 Other abnormalities of gait and mobility: Secondary | ICD-10-CM | POA: Diagnosis not present

## 2020-04-18 ENCOUNTER — Telehealth: Payer: Self-pay | Admitting: Neurology

## 2020-04-18 DIAGNOSIS — D510 Vitamin B12 deficiency anemia due to intrinsic factor deficiency: Secondary | ICD-10-CM | POA: Diagnosis not present

## 2020-04-18 NOTE — Telephone Encounter (Signed)
Patient called stating she would like to discuss possibly being put back on imitriptalin

## 2020-04-18 NOTE — Telephone Encounter (Signed)
Returned pt's call. I LVM asking for call back. Advised I will also try her again tomorrow morning.

## 2020-04-19 NOTE — Telephone Encounter (Signed)
I attempted to reach the pt once more. LVM asking for call back. Left office number and hours in message.

## 2020-04-20 NOTE — Telephone Encounter (Signed)
Spoke with pt. She was placed back on Amitriptyline by PCP. She wanted to let Dr. Jaynee Eagles know that she saw Dr. Buck Mam (note available for review) at [facility] and was told that since she didn't have parkinson's she couldn't have FAHRs disease. She said she was told that the calcifications would not cause anything else and that she couldn't stop them and sometimes they just happen. She was advised to see ENT, undergo balance testing and therapy. 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 Pt is wanting Dr. Cathren Laine opinion on whether or not she should see ENT and also asked "where do I put a stop to this?".

## 2020-04-20 NOTE — Telephone Encounter (Signed)
I am so,so sorry but I don;t have anymore for her, at this point she needs to be managed by PT and therapy for balance and try to work on it with therapy since there appears to be no "fix" and no medication to help. I don;t know if she should see ENT but if it was recommended then I don;t see why not. If she would like I can offer a phone appointment to discuss further but I dont think that I have anymore ideas except working with physical therapy and also seeing a counselor/therapist to help her come to terms with living with her symptoms which we often unfortunately just have to accept. thanks

## 2020-04-20 NOTE — Telephone Encounter (Signed)
Patient called back in regards to missed call  

## 2020-04-21 NOTE — Telephone Encounter (Signed)
I spoke with the patient and let her know that Dr. Jaynee Eagles is so sorry but unfortunately she does not have anything further for her and at this point she does recommend the pt see PT and do therapy for balance as there does not seem to be a fix for this or medication to help. Pt advised Dr. Jaynee Eagles isn't sure if she should see ENT but she doesn't see why not if it was recommended. I offered pt a phone visit if needed to discuss further if she would like. I suggested per Dr. Jaynee Eagles to see a counselor. Pt stated she is already seeing one. She verbalized understanding and appreciation for the call. She did not accept an offer for an appt during the time of the call.

## 2020-04-22 ENCOUNTER — Telehealth (HOSPITAL_COMMUNITY): Payer: Self-pay | Admitting: *Deleted

## 2020-04-22 DIAGNOSIS — F329 Major depressive disorder, single episode, unspecified: Secondary | ICD-10-CM | POA: Diagnosis not present

## 2020-04-22 DIAGNOSIS — F411 Generalized anxiety disorder: Secondary | ICD-10-CM | POA: Diagnosis not present

## 2020-04-22 NOTE — Telephone Encounter (Signed)
Attempted to call patient regarding upcoming cardiac CT appointment. Left message on voicemail with name and callback number Wilson Sample Tai RN Navigator Cardiac Imaging [facility] [phone] Office [phone] Cell 

## 2020-04-25 ENCOUNTER — Ambulatory Visit (HOSPITAL_COMMUNITY): Admission: RE | Admit: 2020-04-25 | Payer: Medicare HMO | Source: Ambulatory Visit

## 2020-04-25 ENCOUNTER — Telehealth (HOSPITAL_COMMUNITY): Payer: Self-pay | Admitting: *Deleted

## 2020-04-25 ENCOUNTER — Telehealth: Payer: Self-pay

## 2020-04-25 MED ORDER — METOPROLOL TARTRATE 100 MG PO TABS: ORAL_TABLET | ORAL | 0 refills | Status: DC

## 2020-04-25 NOTE — Telephone Encounter (Signed)
Returning pt's call regarding Cardiac CT scan.  Instructions reviewed and pt expressed understanding.

## 2020-04-25 NOTE — Telephone Encounter (Signed)
Called and made patient aware that metoprolol 100 mg x1 has been called into preferred pharmacy.

## 2020-04-25 NOTE — Telephone Encounter (Signed)
Patient misplaced her medication for Cardiac Ct and had to be rescheduled, could you call in her medicine again to the pharmacy we have on file , she was rescheduled for Monday 05/02/20

## 2020-04-29 ENCOUNTER — Telehealth (HOSPITAL_COMMUNITY): Payer: Self-pay | Admitting: *Deleted

## 2020-04-29 NOTE — Telephone Encounter (Signed)
Attempted to call patient regarding upcoming cardiac CT appointment. Left message on voicemail with name and callback number  Elowyn Raupp Tai RN Navigator Cardiac Imaging [facility] [phone] Office [phone] Cell 

## 2020-05-02 ENCOUNTER — Other Ambulatory Visit: Payer: Self-pay

## 2020-05-02 ENCOUNTER — Ambulatory Visit (HOSPITAL_COMMUNITY)
Admission: RE | Admit: 2020-05-02 | Discharge: 2020-05-02 | Disposition: A | Discharge status: Home / Self Care | Payer: Medicare HMO | Source: Ambulatory Visit | Attending: Interventional Cardiology | Admitting: Interventional Cardiology

## 2020-05-02 DIAGNOSIS — R072 Precordial pain: Secondary | ICD-10-CM | POA: Insufficient documentation

## 2020-05-02 MED ORDER — NITROGLYCERIN 0.4 MG SL SUBL: 0.8000 mg | SUBLINGUAL_TABLET | Freq: Once | SUBLINGUAL | Status: AC

## 2020-05-02 MED ORDER — NITROGLYCERIN 0.4 MG SL SUBL
SUBLINGUAL_TABLET | SUBLINGUAL | Status: AC
  Administered 2020-05-02: 0.8 mg via SUBLINGUAL
  Filled 2020-05-02: qty 2

## 2020-05-02 MED ORDER — IOHEXOL 350 MG/ML SOLN
80.0000 mL | Freq: Once | INTRAVENOUS | Status: AC | PRN
  Administered 2020-05-02: 80 mL via INTRAVENOUS

## 2020-05-02 MED ORDER — METOPROLOL TARTRATE 5 MG/5ML IV SOLN
INTRAVENOUS | Status: AC
  Administered 2020-05-02: 5 mg via INTRAVENOUS
  Filled 2020-05-02: qty 5

## 2020-05-02 MED ORDER — METOPROLOL TARTRATE 5 MG/5ML IV SOLN: 5.0000 mg | Freq: Once | INTRAVENOUS | Status: AC

## 2020-05-02 NOTE — Progress Notes (Signed)
Patient was having an irregular heart rate. Her rate was going from 100 and dropping down to 50 then staying in 70's with occasional PVCs. Spoke with Dr. Harrell Gave who is the reader for patient's CT heart.  She wanted patient to get Lopressor 5mg  once and then to scan retrospectively. Medication was given per order.

## 2020-05-06 ENCOUNTER — Other Ambulatory Visit: Payer: Self-pay

## 2020-05-06 DIAGNOSIS — R931 Abnormal findings on diagnostic imaging of heart and coronary circulation: Secondary | ICD-10-CM

## 2020-05-06 DIAGNOSIS — E785 Hyperlipidemia, unspecified: Secondary | ICD-10-CM

## 2020-05-09 NOTE — Progress Notes (Signed)
Patient ID: [name]                 DOB: 1951/11/14                    MRN: [id number]     HPI: [name] is a 69 y.o. female patient referred to lipid clinic by Dr. Irish Lack. PMH is significant for T2DM, hypothyroidism, migraines, fatty liver, essential tremor, diverticulitis. Patient had CT coronary scan on 05/02/17 which showed patient has a coronary calcium score of 879. This was 96th percentile for age and sex matched control. 3 vessel non-obstructive CAD was noted with 25-49% stenosis of RCA, LAD, and LCx.  Patient presents for initial appt with lipid clinic. She reports she has experienced significant allergies this past year. She reports she has experienced pain and fluid retention with all statins she has tried (atorvastatin, rosuvastatin, pravastatin). She has multiple complaints at this visit - never received imaging at neurology with [facility] (also was told "she has calcification in her brain"), multiple episodes of diarrhea daily for the past year (follows with gastroenterologist who started her on rifaximin - still experiencing diarrhea). She states her neurologist in [place] is Dr. Sarina Ill. She states she also has [employer] - she provided her medicaid card, which showed a Medicaid ID number of [id number].  Current Medications: none Intolerances: atorvastatin, rosuvastatin, pravastatin Risk Factors: T2DM, CAD with elevated calcium score LDL goal: <70 mg/dL  Diet: "I like to eat carbs"   Exercise: limited due to mobility (walks with a cane)  Family History: Anesthesia problems in her granddaughter, mother, and son; Arthritis in an other family member; Bladder Cancer in her father; Colon cancer in her father; Diabetes in an other family member; High blood pressure in an other family member; Liver cancer in her father; Schizophrenia in her daughter; Tremor in her son.   Social History: never smoker, no alcohol use  Labs: 09/23/19 [facility]): TC 207  TG 251 HDL 34 LDL 123; none  Past Medical History:  Diagnosis Date  . Allergy   . Anemia   . Asthma    Dianosed years ago-no meds at this time  . Cataract    Right eye removed-still has left cataract   . 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 Diabetes mellitus   . Dysrhythmia    "skips beats"  . Fibromyalgia 1980's  . Generalized anxiety disorder   . GERD (gastroesophageal reflux disease)    occ- will use Tums and Prolosec  prn   . Hyperlipemia   . Hypertension   . Hypothyroidism   . Irritable bowel syndrome 1980's  . Major depressive disorder   . Migraine headaches 1980's   On meds-well controlled  . 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  . Morbid obesity ([place])   . Pneumonia 2009   Several times over the past several years  . PONV (postoperative nausea and vomiting)   . Recurrent upper respiratory infection (URI)   . Sleep apnea    no cpap  . Subarachnoid hemorrhage ([place]) 2010  . Vaginal polyp    benign per pt    Current Outpatient Medications on File Prior to Visit  Medication Sig Dispense Refill  . amitriptyline (ELAVIL) 25 MG tablet Take 25-50 mg by mouth at bedtime.     Marland Kitchen aspirin 325 MG tablet Take 325 mg by mouth at bedtime.    . bumetanide (BUMEX) 1 MG tablet  Take 1 mg by mouth every morning.    . busPIRone (BUSPAR) 7.5 MG tablet     . cholecalciferol (VITAMIN D3) 25 MCG (1000 UNIT) tablet Take 1,000 Units by mouth daily.    . Cyanocobalamin (B-12 COMPLIANCE INJECTION) 1000 MCG/ML KIT Inject as directed every 30 (thirty) days.    Marland Kitchen levothyroxine (SYNTHROID, LEVOTHROID) 50 MCG tablet Take 50 mcg by mouth daily.     . metoprolol tartrate (LOPRESSOR) 100 MG tablet Take 1 tablet by mouth 2 hours prior to Cardiac CT 1 tablet 0  . Potassium (POTASSIMIN PO) Take 99 mg by mouth daily.    . rifaximin (XIFAXAN) 550 MG TABS tablet Take 1 tablet (550 mg total) by mouth 3 (three) times daily. 42 tablet 0   No  current facility-administered medications on file prior to visit.    Allergies  Allergen Reactions  . Acarbose   . Atenolol     Stomach problems, chills  . Atorvastatin   . Cinnamon     Stomach pain  . Crestor [Rosuvastatin Calcium]   . Glimepiride   . Invokana [Canagliflozin]   . Januvia [Sitagliptin]   . Losartan Potassium   . Metformin And Related Nausea And Vomiting and Other (See Comments)    Patient states that she has severe chills, headache and cramping additionally. Says blood sugar is uncontrolled because of this  . Pravastatin   . Sulfamethoxazole Other (See Comments)    REACTION: unspecified  . Sulfa Antibiotics Rash and Other (See Comments)    Mother has told patient in the past not to take, she cannot recall there reaction    Assessment/Plan:  2021 [employer] [id number] [facility]) insurance formulary - ezetimibe - $8 for 30 day supply (preferred local pharmacy), $0 for 90 day supply (mail order pharmacy) - Nexletol - not on formulary - Nexlizet - not on formulary - Praluent - not on formulary - Repatha - $45 for 30 day supply (preferred local pharmacy), $125 for 90 day supply (mail order pharmacy)  1. Hyperlipidemia - LDL goal <70 mg/dL; therefore, patient is not at goal. Patient would not like to retrial statin therapy as she has has negative experiences in the past. She would prefer to initiate PCSK9 inhibitor. Counseled patient thoroughly on mechanism of action, efficacy, side effects, dosing, and administration. Will submit prior authorization for Repatha. Once approved, patient will intiate Repatha 140 mg subQ every 2 weeks.  Scheduled follow up labs (lipid panel, LFTs) for 09/02/2020.  2. Neurology concerns - Will relay information to Dr. Sarina Ill that imaging was never obtained at [facility] and patient is upset about this.  Thank you for involving pharmacy to assist in providing this patient's care.   Drexel Iha, PharmD PGY2 Ambulatory Care Pharmacy  Resident

## 2020-05-10 ENCOUNTER — Ambulatory Visit (INDEPENDENT_AMBULATORY_CARE_PROVIDER_SITE_OTHER): Payer: Medicare HMO | Admitting: Pharmacist

## 2020-05-10 ENCOUNTER — Other Ambulatory Visit: Payer: Self-pay

## 2020-05-10 DIAGNOSIS — E785 Hyperlipidemia, unspecified: Secondary | ICD-10-CM | POA: Diagnosis not present

## 2020-05-10 DIAGNOSIS — I25709 Atherosclerosis of coronary artery bypass graft(s), unspecified, with unspecified angina pectoris: Secondary | ICD-10-CM

## 2020-05-10 DIAGNOSIS — I2510 Atherosclerotic heart disease of native coronary artery without angina pectoris: Secondary | ICD-10-CM | POA: Diagnosis not present

## 2020-05-10 NOTE — Patient Instructions (Signed)
It was a pleasure seeing you in clinic today [name]!  Today the plan is... 1. We will get labs today to check your cholesterol before we submit prior authorization for Repatha 2. Once prior authorization is approved you will start Repatha 140 mg subQ every 2 weeks   Please call the PharmD clinic at [phone] if you have any questions that you would like to speak with a pharmacist about [facility], [place], [place]).

## 2020-05-11 ENCOUNTER — Telehealth: Payer: Self-pay | Admitting: Pharmacist

## 2020-05-11 DIAGNOSIS — E039 Hypothyroidism, unspecified: Secondary | ICD-10-CM | POA: Diagnosis not present

## 2020-05-11 DIAGNOSIS — E1129 Type 2 diabetes mellitus with other diabetic kidney complication: Secondary | ICD-10-CM | POA: Diagnosis not present

## 2020-05-11 DIAGNOSIS — I2510 Atherosclerotic heart disease of native coronary artery without angina pectoris: Secondary | ICD-10-CM | POA: Insufficient documentation

## 2020-05-11 DIAGNOSIS — I10 Essential (primary) hypertension: Secondary | ICD-10-CM | POA: Diagnosis not present

## 2020-05-11 DIAGNOSIS — N189 Chronic kidney disease, unspecified: Secondary | ICD-10-CM | POA: Diagnosis not present

## 2020-05-11 DIAGNOSIS — E1122 Type 2 diabetes mellitus with diabetic chronic kidney disease: Secondary | ICD-10-CM | POA: Diagnosis not present

## 2020-05-11 DIAGNOSIS — E1165 Type 2 diabetes mellitus with hyperglycemia: Secondary | ICD-10-CM | POA: Diagnosis not present

## 2020-05-11 DIAGNOSIS — E782 Mixed hyperlipidemia: Secondary | ICD-10-CM | POA: Diagnosis not present

## 2020-05-11 DIAGNOSIS — E785 Hyperlipidemia, unspecified: Secondary | ICD-10-CM | POA: Diagnosis not present

## 2020-05-11 DIAGNOSIS — D510 Vitamin B12 deficiency anemia due to intrinsic factor deficiency: Secondary | ICD-10-CM | POA: Diagnosis not present

## 2020-05-11 LAB — LIPID PANEL
Chol/HDL Ratio: 4.9 ratio — ABNORMAL HIGH (ref 0.0–4.4)
Cholesterol, Total: 194 mg/dL (ref 100–199)
HDL: 40 mg/dL (ref >39)
LDL Chol Calc (NIH): 114 mg/dL — ABNORMAL HIGH (ref 0–99)
Triglycerides: 229 mg/dL — ABNORMAL HIGH (ref 0–149)
VLDL Cholesterol Cal: 40 mg/dL (ref 5–40)

## 2020-05-11 LAB — LDL CHOLESTEROL, DIRECT: LDL Direct: 130 mg/dL — ABNORMAL HIGH (ref 0–99)

## 2020-05-11 MED ORDER — REPATHA SURECLICK 140 MG/ML ~~LOC~~ SOAJ: 1.0000 "pen " | SUBCUTANEOUS | 3 refills | Status: DC

## 2020-05-11 NOTE — Telephone Encounter (Signed)
Repatha prior auth approved through 11/07/20. Rx sent to pharmacy, copay $4 with both [facility] and Medicaid insurance.  Called pt and advised her that Repatha has been approved and of her lab results.   Pt will call clinic once she picks up her Repatha to set a time in clinic for [name] to help with her first injection.

## 2020-05-12 ENCOUNTER — Telehealth: Payer: Self-pay | Admitting: Pharmacist

## 2020-05-12 ENCOUNTER — Ambulatory Visit: Payer: Medicare HMO

## 2020-05-12 ENCOUNTER — Telehealth: Payer: Self-pay | Admitting: Interventional Cardiology

## 2020-05-12 NOTE — Telephone Encounter (Signed)
Pt presented today for Repatha injection training. She successfully self-administered Repatha into her abdomen without issue. Follow up labs have been scheduled for September.

## 2020-05-12 NOTE — Telephone Encounter (Signed)
Accessed pt's chart to look at most recent telephone note in regards to [place]. Called Megan Supple's ext and then transferred the call.

## 2020-05-20 DIAGNOSIS — F411 Generalized anxiety disorder: Secondary | ICD-10-CM | POA: Diagnosis not present

## 2020-05-20 DIAGNOSIS — D510 Vitamin B12 deficiency anemia due to intrinsic factor deficiency: Secondary | ICD-10-CM | POA: Diagnosis not present

## 2020-05-20 DIAGNOSIS — F329 Major depressive disorder, single episode, unspecified: Secondary | ICD-10-CM | POA: Diagnosis not present

## 2020-05-30 ENCOUNTER — Other Ambulatory Visit: Payer: Self-pay | Admitting: Family Medicine

## 2020-05-30 DIAGNOSIS — Z1231 Encounter for screening mammogram for malignant neoplasm of breast: Secondary | ICD-10-CM

## 2020-06-06 DIAGNOSIS — E782 Mixed hyperlipidemia: Secondary | ICD-10-CM | POA: Diagnosis not present

## 2020-06-06 DIAGNOSIS — E1165 Type 2 diabetes mellitus with hyperglycemia: Secondary | ICD-10-CM | POA: Diagnosis not present

## 2020-06-06 DIAGNOSIS — I10 Essential (primary) hypertension: Secondary | ICD-10-CM | POA: Diagnosis not present

## 2020-06-06 DIAGNOSIS — E785 Hyperlipidemia, unspecified: Secondary | ICD-10-CM | POA: Diagnosis not present

## 2020-06-06 DIAGNOSIS — E039 Hypothyroidism, unspecified: Secondary | ICD-10-CM | POA: Diagnosis not present

## 2020-06-06 DIAGNOSIS — E1122 Type 2 diabetes mellitus with diabetic chronic kidney disease: Secondary | ICD-10-CM | POA: Diagnosis not present

## 2020-06-06 DIAGNOSIS — N189 Chronic kidney disease, unspecified: Secondary | ICD-10-CM | POA: Diagnosis not present

## 2020-06-06 DIAGNOSIS — E1169 Type 2 diabetes mellitus with other specified complication: Secondary | ICD-10-CM | POA: Diagnosis not present

## 2020-06-06 DIAGNOSIS — E1129 Type 2 diabetes mellitus with other diabetic kidney complication: Secondary | ICD-10-CM | POA: Diagnosis not present

## 2020-06-09 ENCOUNTER — Telehealth: Payer: Self-pay | Admitting: Pharmacist

## 2020-06-09 NOTE — Telephone Encounter (Signed)
Patient called stating she is now having pain in chest for about 30 seconds, happens about 20 times a day. Wondering if it is being caused by indigestion. Or if its something else. Use to just be a twinge, but now its pain. Also having irregular heartbeats. Feeling palpitations, heart skipping. Very frequently.  Will route to triage.

## 2020-06-09 NOTE — Telephone Encounter (Signed)
Spoke with the patient who states that she has been having increased palpitations and chest pain since starting Repatha. She states that she feels her heart skipping beats especially when she lays down. She said she has always had twinges in her chest but now she describes it as more of a tightness that lasts for about 30 seconds and happens multiple times per day. Pain is not associated with exertion. She denies any SOB. She states that her BP has been running 140s/70s. She does not have nitroglycerin.  Patient reports that she does have indigestion and has been bloated and had an on/off upset stomach that might be causing the pain.  Patient also states that she feels like she might be having a reaction to Repatha because she is having similar feelings to when she has been allergic to other medications.  Advised patient that if chest pain comes on and is not relieved that she should go to the ER. Patient states that she will not be going to the ER because she has had terrible experiences there.

## 2020-06-10 ENCOUNTER — Encounter: Payer: Self-pay | Admitting: Gastroenterology

## 2020-06-10 ENCOUNTER — Ambulatory Visit (INDEPENDENT_AMBULATORY_CARE_PROVIDER_SITE_OTHER): Payer: Medicare HMO | Admitting: Gastroenterology

## 2020-06-10 ENCOUNTER — Other Ambulatory Visit (INDEPENDENT_AMBULATORY_CARE_PROVIDER_SITE_OTHER): Payer: Medicare HMO

## 2020-06-10 VITALS — BP 130/70 | HR 88 | Ht 62.5 in | Wt 188.2 lb

## 2020-06-10 DIAGNOSIS — K219 Gastro-esophageal reflux disease without esophagitis: Secondary | ICD-10-CM | POA: Diagnosis not present

## 2020-06-10 DIAGNOSIS — E538 Deficiency of other specified B group vitamins: Secondary | ICD-10-CM

## 2020-06-10 DIAGNOSIS — K580 Irritable bowel syndrome with diarrhea: Secondary | ICD-10-CM | POA: Diagnosis not present

## 2020-06-10 LAB — VITAMIN B12: Vitamin B-12: 734 pg/mL (ref 211–911)

## 2020-06-10 MED ORDER — PANTOPRAZOLE SODIUM 40 MG PO TBEC
40.0000 mg | DELAYED_RELEASE_TABLET | Freq: Every day | ORAL | 11 refills | Status: DC
Start: 2020-06-10 — End: 2022-04-24

## 2020-06-10 MED ORDER — ASPIRIN EC 81 MG PO TBEC
81.0000 mg | DELAYED_RELEASE_TABLET | Freq: Every day | ORAL | 3 refills | Status: DC
Start: 2020-06-10 — End: 2021-02-10

## 2020-06-10 NOTE — Patient Instructions (Signed)
Go to the basement for your labs today  We have sent Protonix to your pharmacy  Follow up in 4 months  I appreciate the  opportunity to care for you  Thank You   Harl Bowie , MD

## 2020-06-10 NOTE — Telephone Encounter (Signed)
Called and made patient aware of instructions to decrease ASA to 81 mg QD. Patient states that she has been having intermittent episodes of palpitations/skipped beats. Denies excessive caffeine, denies drinking alcohol or having Sx of OSA.[name] She states that she has been a little stressed lately. She will continue to monitor and let us know if her Sx do not improve or become more frequent.

## 2020-06-10 NOTE — Progress Notes (Signed)
[name]    [id number]    [birth year]  Primary Care Physician:Little, Lennette Bihari, MD  Referring Physician: Hulan Fess, MD [place],   [id number]   Chief complaint:  Fecal incontinence, diarrhea  HPI:  69 year old female here for follow-up visit for chronic diarrhea  Diarrhea and her symptoms are about 80% better after she completed the course of rifaximin  Getting B12 injection every 4 weeks but feels she may need to decrease the time interval between injections, is worried if her B12 level is still low but overall her symptoms are improving and she is starting to feel slightly better   Worried about increased calcium score on CT coronary pathology.  Complaints of intermittent heartburn and regurgitation.  Denies any nausea, vomiting, abdominal pain, melena or bright red blood per rectum   Colonoscopy June 03, 2018: 2 sessile polyps removed from transverse and descending colon, one of which was a tubular adenoma. Colonic diverticulosis and internal hemorrhoids.     Outpatient Encounter Medications as of 06/10/2020  Medication Sig  . amitriptyline (ELAVIL) 25 MG tablet Take 25-50 mg by mouth at bedtime.   Marland Kitchen aspirin EC 81 MG tablet Take 1 tablet (81 mg total) by mouth daily. Swallow whole.  . bumetanide (BUMEX) 1 MG tablet Take 1 mg by mouth every morning.  . Cyanocobalamin (B-12 COMPLIANCE INJECTION) 1000 MCG/ML KIT Inject as directed every 30 (thirty) days.  . Evolocumab (REPATHA SURECLICK) 388 MG/ML SOAJ Inject 1 pen into the skin every 14 (fourteen) days.  Marland Kitchen levothyroxine (SYNTHROID, LEVOTHROID) 50 MCG tablet Take 50 mcg by mouth daily.   . Potassium (POTASSIMIN PO) Take 99 mg by mouth daily.  . [DISCONTINUED] busPIRone (BUSPAR) 7.5 MG tablet   . [DISCONTINUED] cholecalciferol (VITAMIN D3) 25 MCG (1000 UNIT) tablet Take 1,000 Units by mouth daily.  . [DISCONTINUED] metoprolol tartrate (LOPRESSOR) 100 MG tablet Take 1  tablet by mouth 2 hours prior to Cardiac CT  . [DISCONTINUED] rifaximin (XIFAXAN) 550 MG TABS tablet Take 1 tablet (550 mg total) by mouth 3 (three) times daily.   No facility-administered encounter medications on file as of 06/10/2020.    Allergies as of 06/10/2020 - Review Complete 06/10/2020  Allergen Reaction Noted  . Acarbose  05/20/2018  . Atenolol  09/29/2019  . Atorvastatin  05/20/2018  . Cinnamon  06/15/2015  . Crestor [rosuvastatin calcium]  05/20/2018  . Glimepiride  05/20/2018  . Invokana [canagliflozin]  05/20/2018  . Januvia [sitagliptin]  05/20/2018  . Losartan potassium  05/20/2018  . Metformin and related Nausea And Vomiting and Other (See Comments) 06/26/2011  . Pravastatin  05/20/2018  . Sulfamethoxazole Other (See Comments) 03/24/2007  . Sulfa antibiotics Rash and Other (See Comments) 06/16/2011    Past Medical History:  Diagnosis Date  . Allergy   . Anemia   . Asthma    Dianosed years ago-no meds at this time  . Cataract    Right eye removed-still has left cataract   . 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 Diabetes mellitus   . Dysrhythmia    "skips beats"  . Fibromyalgia 1980's  . Generalized anxiety disorder   . GERD (gastroesophageal reflux disease)    occ- will use Tums and Prolosec  prn   . Hyperlipemia   . Hypertension   . Hypothyroidism   . Irritable bowel syndrome 1980's  . Major depressive disorder   .  Migraine headaches 1980's   On meds-well controlled  . 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  . Morbid obesity ([place])   . Pneumonia 2009   Several times over the past several years  . PONV (postoperative nausea and vomiting)   . Recurrent upper respiratory infection (URI)   . Sleep apnea    no cpap  . Subarachnoid hemorrhage ([place]) 2010  . Vaginal polyp    benign per pt    Past Surgical History:  Procedure Laterality Date  . CHOLECYSTECTOMY  1983  . COLONOSCOPY  2008   DB   . [place] OF UTERUS  1972  . HYSTEROSCOPY WITH D & C  06/29/2011   Procedure: DILATATION AND CURETTAGE (D&C) /HYSTEROSCOPY;  Surgeon: Donnamae Jude, MD;  Location: [facility];  Service: Gynecology;  Laterality: N/A;    Family History  Problem Relation Age of Onset  . Liver cancer Father        mets to liver   . Bladder Cancer Father   . Colon cancer Father        mets to colon   . Anesthesia problems Mother        hard to wake post op   . High blood pressure Other   . Diabetes Other   . Arthritis Other   . Anesthesia problems Son        stopped breathing post op   . Tremor Son   . Schizophrenia Daughter        Schizoaffective disorder  . Anesthesia problems Granddaughter        PONV  . Dementia Neg Hx   . Esophageal cancer Neg Hx   . Rectal cancer Neg Hx   . Stomach cancer Neg Hx   . Colon polyps Neg Hx     Social History   Socioeconomic History  . Marital status: Legally Separated    Spouse name: Not on file  . Number of children: 3  . Years of education: 28  . Highest education level: Not on file  Occupational History  . Occupation: Retired   Tobacco Use  . Smoking status: Never Smoker  . Smokeless tobacco: Never Used  Vaping Use  . Vaping Use: Never used  Substance and Sexual Activity  . Alcohol use: No    Comment: quit 1978  . Drug use: No  . Sexual activity: Not Currently  Other Topics Concern  . Not on file  Social History Narrative   Lives at home. Her granddaughter lives with her.    Right handed   Social Determinants of Health   Financial Resource Strain:   . Difficulty of Paying Living Expenses:   Food Insecurity:   . Worried About Charity fundraiser in the Last Year:   . Arboriculturist in the Last Year:   Transportation Needs:   . Film/video editor (Medical):   Marland Kitchen Lack of Transportation (Non-Medical):   Physical Activity:   . Days of Exercise per Week:   . Minutes of Exercise per Session:   Stress:   . Feeling of  Stress :   Social Connections:   . Frequency of Communication with Friends and Family:   . Frequency of Social Gatherings with Friends and Family:   . Attends Religious Services:   . Active Member of Clubs or Organizations:   . Attends Archivist Meetings:   Marland Kitchen Marital Status:   Intimate Partner Violence:   . Fear of Current or Ex-Partner:   .  Emotionally Abused:   Marland Kitchen Physically Abused:   . Sexually Abused:       Review of systems: All other review of systems negative except as mentioned in the HPI.   Physical Exam: Vitals:   06/10/20 1437  BP: 130/70  Pulse: 88   Body mass index is 33.87 kg/m. Gen:      No acute distress Neuro: alert and oriented x 3 Psych: normal mood and affect  Data Reviewed:  Reviewed labs, radiology imaging, old records and pertinent past GI work up   Assessment and Plan/Recommendations: 69 year old female with history of chronic irritable bowel syndrome predominant diarrhea  IBS-D: Improved after course of rifaximin Continue with high-fiber diet, increase fluid intake  Vitamin B12 deficiency with significantly low level 171 currently on B12 injections Patient is worried that her B12 level may still be low.  We will recheck B12 level Continue injections every 4 weeks through PMDs office  GERD: Start Protonix 40 mg daily Antireflux measures  Advised patient to follow-up and discuss with cardiology the findings of CT coronary, elevated calcium score.  Does not have any critical stenosis of coronary arteries.  Return in 3 to 4 months or sooner if needed    The patient was provided an opportunity to ask questions and all were answered. The patient agreed with the plan and demonstrated an understanding of the instructions.  Damaris Hippo , MD    CC: Hulan Fess, MD

## 2020-06-10 NOTE — Telephone Encounter (Signed)
OK to try aspirin 81 mg daily to see if this decreases stomach upset, rather than 325 mg which is what is listed.  JV

## 2020-06-15 ENCOUNTER — Telehealth: Payer: Self-pay | Admitting: Gastroenterology

## 2020-06-15 ENCOUNTER — Encounter: Payer: Self-pay | Admitting: Gastroenterology

## 2020-06-15 DIAGNOSIS — F329 Major depressive disorder, single episode, unspecified: Secondary | ICD-10-CM | POA: Diagnosis not present

## 2020-06-15 NOTE — Telephone Encounter (Signed)
Called the patient. No answer. Left her a message that the B12 level is  734. The range is 211-911. A copy of her results went out in the mail today.

## 2020-06-15 NOTE — Telephone Encounter (Signed)
Pt called back and would like to know the number of the Normal B12.

## 2020-06-15 NOTE — Telephone Encounter (Signed)
Called the patient back. No answer. Left a message on her voicemail. Nothing is "elevated" but rather her B12 level is normal now. Continue the B12 injections every 4 weeks. They are helping her.

## 2020-06-16 ENCOUNTER — Telehealth: Payer: Self-pay | Admitting: Interventional Cardiology

## 2020-06-16 NOTE — Telephone Encounter (Signed)
    Pt would like to know if it safe for her to get covid vaccine, she said she is worried because she saw a pharmacist recently and she has 95% chance getting a heart attack and her calcium is at 896 and receiving repatha. She said her vaccine is scheduled today at 3pm.

## 2020-06-16 NOTE — Telephone Encounter (Signed)
Called and made patient aware that we recommend that all of our patients get vaccinated.

## 2020-06-17 DIAGNOSIS — F411 Generalized anxiety disorder: Secondary | ICD-10-CM | POA: Diagnosis not present

## 2020-06-28 DIAGNOSIS — D510 Vitamin B12 deficiency anemia due to intrinsic factor deficiency: Secondary | ICD-10-CM | POA: Diagnosis not present

## 2020-07-07 DIAGNOSIS — Z23 Encounter for immunization: Secondary | ICD-10-CM | POA: Diagnosis not present

## 2020-07-12 DIAGNOSIS — E1129 Type 2 diabetes mellitus with other diabetic kidney complication: Secondary | ICD-10-CM | POA: Diagnosis not present

## 2020-07-12 DIAGNOSIS — N182 Chronic kidney disease, stage 2 (mild): Secondary | ICD-10-CM | POA: Diagnosis not present

## 2020-07-12 DIAGNOSIS — E785 Hyperlipidemia, unspecified: Secondary | ICD-10-CM | POA: Diagnosis not present

## 2020-07-12 DIAGNOSIS — E1169 Type 2 diabetes mellitus with other specified complication: Secondary | ICD-10-CM | POA: Diagnosis not present

## 2020-07-12 DIAGNOSIS — N189 Chronic kidney disease, unspecified: Secondary | ICD-10-CM | POA: Diagnosis not present

## 2020-07-12 DIAGNOSIS — N050 Unspecified nephritic syndrome with minor glomerular abnormality: Secondary | ICD-10-CM | POA: Diagnosis not present

## 2020-07-12 DIAGNOSIS — D631 Anemia in chronic kidney disease: Secondary | ICD-10-CM | POA: Diagnosis not present

## 2020-07-12 DIAGNOSIS — E1122 Type 2 diabetes mellitus with diabetic chronic kidney disease: Secondary | ICD-10-CM | POA: Diagnosis not present

## 2020-07-12 DIAGNOSIS — E559 Vitamin D deficiency, unspecified: Secondary | ICD-10-CM | POA: Diagnosis not present

## 2020-07-12 DIAGNOSIS — E1165 Type 2 diabetes mellitus with hyperglycemia: Secondary | ICD-10-CM | POA: Diagnosis not present

## 2020-07-12 DIAGNOSIS — N049 Nephrotic syndrome with unspecified morphologic changes: Secondary | ICD-10-CM | POA: Diagnosis not present

## 2020-07-12 DIAGNOSIS — I129 Hypertensive chronic kidney disease with stage 1 through stage 4 chronic kidney disease, or unspecified chronic kidney disease: Secondary | ICD-10-CM | POA: Diagnosis not present

## 2020-07-12 DIAGNOSIS — E039 Hypothyroidism, unspecified: Secondary | ICD-10-CM | POA: Diagnosis not present

## 2020-07-12 DIAGNOSIS — I10 Essential (primary) hypertension: Secondary | ICD-10-CM | POA: Diagnosis not present

## 2020-07-12 DIAGNOSIS — E782 Mixed hyperlipidemia: Secondary | ICD-10-CM | POA: Diagnosis not present

## 2020-07-13 ENCOUNTER — Telehealth: Payer: Self-pay

## 2020-07-13 NOTE — Telephone Encounter (Signed)
Pt returned call. She does not know what pharmacy we need to send rx to. States that the pharmacy told her they would call our office to ask for verbal prescription for replacement pens. Will await call from pharmacy since we do not have a pharmacy name to send it to proactively.

## 2020-07-13 NOTE — Telephone Encounter (Signed)
Pt calling requesting a new Rx for Repatha, because pt stated that she received 2 damaged vials and to replace the damaged ones, pt needs a new script sent to pt's pharmacy. Please address

## 2020-07-13 NOTE — Telephone Encounter (Signed)
Pt already has active rx on file with refills. Left message for pt to ask where she needs new rx sent to. Local pharmacy will not be able to refill rx to soon and process it through insurance, damaged/replacement pens typically come from a different pharmacy.

## 2020-07-15 ENCOUNTER — Ambulatory Visit: Payer: Medicare HMO

## 2020-07-15 NOTE — Telephone Encounter (Signed)
Called [facility] at [phone] and gave verbal for product replacement.

## 2020-07-22 DIAGNOSIS — F411 Generalized anxiety disorder: Secondary | ICD-10-CM | POA: Diagnosis not present

## 2020-07-22 DIAGNOSIS — F329 Major depressive disorder, single episode, unspecified: Secondary | ICD-10-CM | POA: Diagnosis not present

## 2020-07-29 DIAGNOSIS — D510 Vitamin B12 deficiency anemia due to intrinsic factor deficiency: Secondary | ICD-10-CM | POA: Diagnosis not present

## 2020-08-04 DIAGNOSIS — F329 Major depressive disorder, single episode, unspecified: Secondary | ICD-10-CM | POA: Diagnosis not present

## 2020-08-10 DIAGNOSIS — I10 Essential (primary) hypertension: Secondary | ICD-10-CM | POA: Diagnosis not present

## 2020-08-10 DIAGNOSIS — E785 Hyperlipidemia, unspecified: Secondary | ICD-10-CM | POA: Diagnosis not present

## 2020-08-10 DIAGNOSIS — E039 Hypothyroidism, unspecified: Secondary | ICD-10-CM | POA: Diagnosis not present

## 2020-08-10 DIAGNOSIS — E1129 Type 2 diabetes mellitus with other diabetic kidney complication: Secondary | ICD-10-CM | POA: Diagnosis not present

## 2020-08-10 DIAGNOSIS — N189 Chronic kidney disease, unspecified: Secondary | ICD-10-CM | POA: Diagnosis not present

## 2020-08-10 DIAGNOSIS — E782 Mixed hyperlipidemia: Secondary | ICD-10-CM | POA: Diagnosis not present

## 2020-08-10 DIAGNOSIS — E1165 Type 2 diabetes mellitus with hyperglycemia: Secondary | ICD-10-CM | POA: Diagnosis not present

## 2020-08-10 DIAGNOSIS — E1169 Type 2 diabetes mellitus with other specified complication: Secondary | ICD-10-CM | POA: Diagnosis not present

## 2020-08-10 DIAGNOSIS — E1122 Type 2 diabetes mellitus with diabetic chronic kidney disease: Secondary | ICD-10-CM | POA: Diagnosis not present

## 2020-08-17 ENCOUNTER — Telehealth: Payer: Self-pay | Admitting: Neurology

## 2020-08-17 NOTE — Telephone Encounter (Signed)
Records faxed to [place] at [facility] 08/17/2020.

## 2020-08-26 ENCOUNTER — Ambulatory Visit: Payer: Medicare HMO

## 2020-08-26 ENCOUNTER — Ambulatory Visit (INDEPENDENT_AMBULATORY_CARE_PROVIDER_SITE_OTHER): Payer: Medicare HMO | Admitting: Obstetrics and Gynecology

## 2020-08-26 ENCOUNTER — Other Ambulatory Visit: Payer: Self-pay

## 2020-08-26 ENCOUNTER — Encounter: Payer: Self-pay | Admitting: Obstetrics and Gynecology

## 2020-08-26 VITALS — BP 155/86 | HR 91 | Ht 62.5 in | Wt 177.4 lb

## 2020-08-26 DIAGNOSIS — N950 Postmenopausal bleeding: Secondary | ICD-10-CM | POA: Diagnosis not present

## 2020-08-26 NOTE — Progress Notes (Signed)
Pt states the bleeding & cramping started 3.5 weeks ago. No clots.

## 2020-08-26 NOTE — Progress Notes (Signed)
GYNECOLOGY OFFICE NOTE  History:  68 y.o. [id number] here today for post menopausal bleeding.  Here for post menopausal bleeding. Last got her period age 60. Has had bleeding once since then and was evaluated.   This bleeding started about 3 weeks ago, was heavy as a period and has lightened since then. Also having cramping and pain, particularly bad on the left.  Has started anti-depressant and sleeping pill, did not like the sleeping pill due to dizziness. No new blood thinners  Past Medical History:  Diagnosis Date  . Allergy   . Anemia   . Asthma    Dianosed years ago-no meds at this time  . Cataract    Right eye removed-still has left cataract   . 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 Diabetes mellitus   . Dysrhythmia    "skips beats"  . Fibromyalgia 1980's  . Generalized anxiety disorder   . GERD (gastroesophageal reflux disease)    occ- will use Tums and Prolosec  prn   . Hyperlipemia   . Hypertension   . Hypothyroidism   . Irritable bowel syndrome 1980's  . Major depressive disorder   . Migraine headaches 1980's   On meds-well controlled  . 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  . Morbid obesity ([place])   . Pneumonia 2009   Several times over the past several years  . PONV (postoperative nausea and vomiting)   . Recurrent upper respiratory infection (URI)   . Sleep apnea    no cpap  . Subarachnoid hemorrhage ([place]) 2010  . Vaginal polyp    benign per pt    Past Surgical History:  Procedure Laterality Date  . CHOLECYSTECTOMY  1983  . COLONOSCOPY  2008   DB  . [place] OF UTERUS  1972  . HYSTEROSCOPY WITH D & C  06/29/2011   Procedure: DILATATION AND CURETTAGE (D&C) /HYSTEROSCOPY;  Surgeon: Donnamae Jude, MD;  Location: [facility];  Service: Gynecology;  Laterality: N/A;     Current Outpatient Medications:  .  amitriptyline (ELAVIL) 25 MG tablet, Take 25-50 mg by mouth at bedtime. , Disp: ,  Rfl:  .  aspirin EC 81 MG tablet, Take 1 tablet (81 mg total) by mouth daily. Swallow whole., Disp: 90 tablet, Rfl: 3 .  bumetanide (BUMEX) 1 MG tablet, Take 1 mg by mouth every morning., Disp: , Rfl:  .  buPROPion (WELLBUTRIN SR) 150 MG 12 hr tablet, Take 150 mg by mouth 2 (two) times daily., Disp: , Rfl:  .  Cyanocobalamin (B-12 COMPLIANCE INJECTION) 1000 MCG/ML KIT, Inject as directed every 30 (thirty) days., Disp: , Rfl:  .  eszopiclone (LUNESTA) 1 MG TABS tablet, Take 1 mg by mouth at bedtime as needed for sleep. Take immediately before bedtime, Disp: , Rfl:  .  Evolocumab (REPATHA SURECLICK) 476 MG/ML SOAJ, Inject 1 pen into the skin every 14 (fourteen) days., Disp: 6 pen, Rfl: 3 .  levothyroxine (SYNTHROID, LEVOTHROID) 50 MCG tablet, Take 50 mcg by mouth daily. , Disp: , Rfl:  .  pantoprazole (PROTONIX) 40 MG tablet, Take 1 tablet (40 mg total) by mouth daily., Disp: 30 tablet, Rfl: 11 .  Potassium (POTASSIMIN PO), Take 99 mg by mouth daily., Disp: , Rfl:   The following portions of the patient's history were reviewed and updated as appropriate: allergies, current medications, past family history, past medical history, past social history, past surgical history and problem list.  Review of Systems:  Pertinent items noted in HPI and remainder of comprehensive ROS otherwise negative.   Objective:  Physical Exam BP (!) 155/86 (BP Location: Right Arm)   Pulse 91   Ht 5' 2.5" (1.588 m)   Wt 177 lb 6.4 oz (80.5 kg)   BMI 31.93 kg/m  CONSTITUTIONAL: Well-developed, well-nourished female in no acute distress.  HENT:  Normocephalic, atraumatic. External right and left ear normal. Oropharynx is clear and moist EYES: Conjunctivae and EOM are normal. Pupils are equal, round, and reactive to light. No scleral icterus.  NECK: Normal range of motion, supple, no masses SKIN: Skin is warm and dry. No rash noted. Not diaphoretic. No erythema. No pallor. NEUROLOGIC: Alert and oriented to person,  place, and time. Normal reflexes, muscle tone coordination. No cranial nerve deficit noted. PSYCHIATRIC: Normal mood and affect. Normal behavior. Normal judgment and thought content. CARDIOVASCULAR: Normal heart rate noted RESPIRATORY: Effort normal, no problems with respiration noted ABDOMEN: Soft, no distention noted.   PELVIC: Normal appearing mildly atrophic female external genitalia; normal appearing vaginal mucosa and cervix, mildly atrophic.  No abnormal discharge noted.  No source for bleeding identified. Normal uterine size, no other palpable masses, no uterine or adnexal tenderness. MUSCULOSKELETAL: Normal range of motion. No edema noted.  Exam done with chaperone present.  Labs and Imaging No results found.  Assessment & Plan:   1. Post-menopausal bleeding Pt with new onset PMB Recommend EMB and ultrasound, reviewed low likelihood of uterine carcinoma but need to rule out, she is agreeable to ultrasound, would like to proceed with biopsy only if necessary - for [name] and return for results, possible EMB, pt agreeable to plan - US PELVIC COMPLETE WITH TRANSVAGINAL; Future   Routine preventative health maintenance measures emphasized. Please refer to After Visit Summary for other counseling recommendations.   Return in about 4 weeks (around 09/23/2020) for after ultrasound.  Total face-to-face time with patient: 22 minutes. Over 50% of encounter was spent on counseling and coordination of care.  Feliz Beam, M.D. Attending Center for [employer] [facility] farm manager)

## 2020-08-30 DIAGNOSIS — D510 Vitamin B12 deficiency anemia due to intrinsic factor deficiency: Secondary | ICD-10-CM | POA: Diagnosis not present

## 2020-09-01 ENCOUNTER — Other Ambulatory Visit: Payer: Self-pay

## 2020-09-01 ENCOUNTER — Ambulatory Visit
Admission: RE | Admit: 2020-09-01 | Discharge: 2020-09-01 | Disposition: A | Discharge status: Home / Self Care | Payer: Medicare HMO | Source: Ambulatory Visit | Attending: Obstetrics and Gynecology | Admitting: Obstetrics and Gynecology

## 2020-09-01 DIAGNOSIS — N950 Postmenopausal bleeding: Secondary | ICD-10-CM | POA: Diagnosis not present

## 2020-09-02 ENCOUNTER — Other Ambulatory Visit: Payer: Medicare HMO | Admitting: *Deleted

## 2020-09-02 DIAGNOSIS — E785 Hyperlipidemia, unspecified: Secondary | ICD-10-CM

## 2020-09-03 LAB — HEPATIC FUNCTION PANEL
ALT: 8 IU/L (ref 0–32)
AST: 16 IU/L (ref 0–40)
Albumin: 4.6 g/dL (ref 3.8–4.8)
Alkaline Phosphatase: 62 IU/L (ref 44–121)
Bilirubin Total: 0.6 mg/dL (ref 0.0–1.2)
Bilirubin, Direct: 0.23 mg/dL (ref 0.00–0.40)
Total Protein: 7.1 g/dL (ref 6.0–8.5)

## 2020-09-03 LAB — LIPID PANEL
Chol/HDL Ratio: 2.6 ratio (ref 0.0–4.4)
Cholesterol, Total: 132 mg/dL (ref 100–199)
HDL: 51 mg/dL (ref >39)
LDL Chol Calc (NIH): 56 mg/dL (ref 0–99)
Triglycerides: 145 mg/dL (ref 0–149)
VLDL Cholesterol Cal: 25 mg/dL (ref 5–40)

## 2020-09-05 ENCOUNTER — Telehealth: Payer: Self-pay

## 2020-09-05 DIAGNOSIS — E1165 Type 2 diabetes mellitus with hyperglycemia: Secondary | ICD-10-CM | POA: Diagnosis not present

## 2020-09-05 DIAGNOSIS — E1129 Type 2 diabetes mellitus with other diabetic kidney complication: Secondary | ICD-10-CM | POA: Diagnosis not present

## 2020-09-05 DIAGNOSIS — E785 Hyperlipidemia, unspecified: Secondary | ICD-10-CM | POA: Diagnosis not present

## 2020-09-05 DIAGNOSIS — E1122 Type 2 diabetes mellitus with diabetic chronic kidney disease: Secondary | ICD-10-CM | POA: Diagnosis not present

## 2020-09-05 DIAGNOSIS — E782 Mixed hyperlipidemia: Secondary | ICD-10-CM | POA: Diagnosis not present

## 2020-09-05 DIAGNOSIS — N189 Chronic kidney disease, unspecified: Secondary | ICD-10-CM | POA: Diagnosis not present

## 2020-09-05 DIAGNOSIS — I10 Essential (primary) hypertension: Secondary | ICD-10-CM | POA: Diagnosis not present

## 2020-09-05 DIAGNOSIS — F329 Major depressive disorder, single episode, unspecified: Secondary | ICD-10-CM | POA: Diagnosis not present

## 2020-09-05 DIAGNOSIS — E039 Hypothyroidism, unspecified: Secondary | ICD-10-CM | POA: Diagnosis not present

## 2020-09-05 DIAGNOSIS — F411 Generalized anxiety disorder: Secondary | ICD-10-CM | POA: Diagnosis not present

## 2020-09-05 DIAGNOSIS — E1169 Type 2 diabetes mellitus with other specified complication: Secondary | ICD-10-CM | POA: Diagnosis not present

## 2020-09-05 NOTE — Telephone Encounter (Signed)
-----   Message from Sloan Leiter, MD sent at 09/02/2020 12:12 PM EDT ----- Please let patient know stripe was thickened and I reocmmend an endometrial biopsy at her visit with me

## 2020-09-05 NOTE — Telephone Encounter (Signed)
Called pt to advise of thickened Endometrial stripe & that Dr. Rosana Hoes did want to do a Endometrial Biopsy. Pt was concerned that she had to waite until her next visit which is 09/19/20 @ 10:35am. Advised that she is ok to waut until then. Pt also wanted to know how long cramping would be after procedure, advised usually 2 to 3 mins or shortly after biopsy is done. Suggested for Pt to take some Tylenol or Ibuprofen before visit. Pt verbalized understanding.

## 2020-09-06 ENCOUNTER — Telehealth: Payer: Self-pay | Admitting: Lactation Services

## 2020-09-06 NOTE — Telephone Encounter (Signed)
Patient called and LM on nurse voicemail that she would like her results of her [name]. Patient was called yesterday with results and recommendations.

## 2020-09-07 ENCOUNTER — Other Ambulatory Visit: Payer: Self-pay

## 2020-09-07 ENCOUNTER — Ambulatory Visit: Payer: Medicare HMO

## 2020-09-07 ENCOUNTER — Ambulatory Visit
Admission: RE | Admit: 2020-09-07 | Discharge: 2020-09-07 | Disposition: A | Discharge status: Home / Self Care | Payer: Medicare HMO | Source: Ambulatory Visit | Attending: Family Medicine | Admitting: Family Medicine

## 2020-09-07 DIAGNOSIS — Z1231 Encounter for screening mammogram for malignant neoplasm of breast: Secondary | ICD-10-CM

## 2020-09-19 ENCOUNTER — Other Ambulatory Visit: Payer: Self-pay

## 2020-09-19 ENCOUNTER — Ambulatory Visit (INDEPENDENT_AMBULATORY_CARE_PROVIDER_SITE_OTHER): Payer: Medicare HMO | Admitting: Obstetrics and Gynecology

## 2020-09-19 ENCOUNTER — Other Ambulatory Visit (HOSPITAL_COMMUNITY)
Admission: RE | Admit: 2020-09-19 | Discharge: 2020-09-19 | Disposition: A | Discharge status: Home / Self Care | Payer: Medicare HMO | Source: Ambulatory Visit | Attending: Obstetrics and Gynecology | Admitting: Obstetrics and Gynecology

## 2020-09-19 ENCOUNTER — Encounter: Payer: Self-pay | Admitting: Obstetrics and Gynecology

## 2020-09-19 VITALS — BP 145/85 | HR 90 | Ht 62.0 in | Wt 136.5 lb

## 2020-09-19 DIAGNOSIS — N950 Postmenopausal bleeding: Secondary | ICD-10-CM | POA: Diagnosis not present

## 2020-09-19 DIAGNOSIS — C541 Malignant neoplasm of endometrium: Secondary | ICD-10-CM | POA: Diagnosis not present

## 2020-09-19 NOTE — Progress Notes (Addendum)
ENDOMETRIAL BIOPSY      [name] is a 69 y.o. [id number] here for endometrial biopsy.  The indications for endometrial biopsy were reviewed.  Risks of the biopsy including cramping, bleeding, infection, uterine perforation, inadequate specimen and need for additional procedures were discussed. The patient states she understands and agrees to undergo procedure today. Consent was signed. Time out was performed.   Indications: post menopausal bleeding Urine HCG: n/a  A bivalve speculum was placed into the vagina and the cervix was easily visualized and was prepped with Betadine x2. A single-toothed tenaculum was placed on the anterior lip of the cervix to stabilize it. The 3 mm pipelle was introduced into the endometrial cavity without difficulty to a depth of 9 cm, and a moderate amount of tissue was obtained and sent to pathology. This was repeated for a total of 3 passes. The instruments were removed from the patient's vagina. Minimal bleeding from the cervix at the tenaculum was noted.   The patient tolerated the procedure well. Routine post-procedure instructions were given to the patient.    Will base further management on results of biopsy.  Will call patient with results.  Feliz Beam, M.D. Attending Center for [employer] [facility] farm manager)

## 2020-09-21 ENCOUNTER — Telehealth: Payer: Self-pay

## 2020-09-21 NOTE — Telephone Encounter (Signed)
Please review patient endo bio results.

## 2020-09-22 NOTE — Addendum Note (Signed)
Addended by: Vivien Rota on: 09/22/2020 11:58 AM   Modules accepted: Orders

## 2020-09-23 ENCOUNTER — Telehealth: Payer: Self-pay | Admitting: *Deleted

## 2020-09-23 NOTE — Telephone Encounter (Signed)
Called the patient and left a message to call the office back. Patient needs to be scheduled for a new patient appt.  

## 2020-09-23 NOTE — Telephone Encounter (Signed)
Patient called back and was scheduled for a new patient appt on 10/14 at 3:15 pm. Gave the patient the address and phone number. Also gave the patient the policy for mask and visitors. Patient stated "I am more bleeding and it's bright red blood. Is that a cause for concern." Explained that the patient needs to call Dr Rosana Hoes office as soon as we get off the phone.

## 2020-09-27 LAB — SURGICAL PATHOLOGY

## 2020-09-28 ENCOUNTER — Telehealth: Payer: Self-pay | Admitting: Gastroenterology

## 2020-09-28 NOTE — Telephone Encounter (Signed)
Called back. No answer. Left a message of my return call.

## 2020-09-28 NOTE — Telephone Encounter (Signed)
Pt is requesting a call back from a nurse to discuss her diarrhea that has turned into constipation within the past 6 weeks. Pt does not wish to wait till November to be seen, does not want a PA.

## 2020-09-29 ENCOUNTER — Encounter: Payer: Self-pay | Admitting: Gynecologic Oncology

## 2020-09-29 ENCOUNTER — Inpatient Hospital Stay: Payer: Medicare HMO | Attending: Gynecologic Oncology | Admitting: Gynecologic Oncology

## 2020-09-29 ENCOUNTER — Other Ambulatory Visit: Payer: Self-pay

## 2020-09-29 ENCOUNTER — Inpatient Hospital Stay: Payer: Medicare HMO

## 2020-09-29 VITALS — BP 162/75 | HR 96 | Temp 98.1°F | Resp 18 | Ht 62.25 in | Wt 178.6 lb

## 2020-09-29 DIAGNOSIS — I129 Hypertensive chronic kidney disease with stage 1 through stage 4 chronic kidney disease, or unspecified chronic kidney disease: Secondary | ICD-10-CM | POA: Insufficient documentation

## 2020-09-29 DIAGNOSIS — I2510 Atherosclerotic heart disease of native coronary artery without angina pectoris: Secondary | ICD-10-CM | POA: Diagnosis not present

## 2020-09-29 DIAGNOSIS — E119 Type 2 diabetes mellitus without complications: Secondary | ICD-10-CM

## 2020-09-29 DIAGNOSIS — E1122 Type 2 diabetes mellitus with diabetic chronic kidney disease: Secondary | ICD-10-CM | POA: Insufficient documentation

## 2020-09-29 DIAGNOSIS — F329 Major depressive disorder, single episode, unspecified: Secondary | ICD-10-CM | POA: Diagnosis not present

## 2020-09-29 DIAGNOSIS — E1136 Type 2 diabetes mellitus with diabetic cataract: Secondary | ICD-10-CM | POA: Diagnosis not present

## 2020-09-29 DIAGNOSIS — C541 Malignant neoplasm of endometrium: Secondary | ICD-10-CM

## 2020-09-29 DIAGNOSIS — M797 Fibromyalgia: Secondary | ICD-10-CM | POA: Diagnosis not present

## 2020-09-29 DIAGNOSIS — E039 Hypothyroidism, unspecified: Secondary | ICD-10-CM | POA: Diagnosis not present

## 2020-09-29 DIAGNOSIS — N189 Chronic kidney disease, unspecified: Secondary | ICD-10-CM | POA: Insufficient documentation

## 2020-09-29 DIAGNOSIS — Z7982 Long term (current) use of aspirin: Secondary | ICD-10-CM | POA: Diagnosis not present

## 2020-09-29 DIAGNOSIS — J45909 Unspecified asthma, uncomplicated: Secondary | ICD-10-CM | POA: Diagnosis not present

## 2020-09-29 DIAGNOSIS — K219 Gastro-esophageal reflux disease without esophagitis: Secondary | ICD-10-CM | POA: Insufficient documentation

## 2020-09-29 DIAGNOSIS — E1165 Type 2 diabetes mellitus with hyperglycemia: Secondary | ICD-10-CM | POA: Diagnosis not present

## 2020-09-29 DIAGNOSIS — Z79899 Other long term (current) drug therapy: Secondary | ICD-10-CM | POA: Insufficient documentation

## 2020-09-29 DIAGNOSIS — E785 Hyperlipidemia, unspecified: Secondary | ICD-10-CM | POA: Insufficient documentation

## 2020-09-29 LAB — CBC WITH DIFFERENTIAL (CANCER CENTER ONLY)
Abs Immature Granulocytes: 0.02 10*3/uL (ref 0.00–0.07)
Basophils Absolute: 0 10*3/uL (ref 0.0–0.1)
Basophils Relative: 1 %
Eosinophils Absolute: 0.1 10*3/uL (ref 0.0–0.5)
Eosinophils Relative: 2 %
HCT: 39.2 % (ref 36.0–46.0)
Hemoglobin: 13.1 g/dL (ref 12.0–15.0)
Immature Granulocytes: 0 %
Lymphocytes Relative: 24 %
Lymphs Abs: 1.3 10*3/uL (ref 0.7–4.0)
MCH: 30.1 pg (ref 26.0–34.0)
MCHC: 33.4 g/dL (ref 30.0–36.0)
MCV: 90.1 fL (ref 80.0–100.0)
Monocytes Absolute: 0.4 10*3/uL (ref 0.1–1.0)
Monocytes Relative: 8 %
Neutro Abs: 3.5 10*3/uL (ref 1.7–7.7)
Neutrophils Relative %: 65 %
Platelet Count: 210 10*3/uL (ref 150–400)
RBC: 4.35 MIL/uL (ref 3.87–5.11)
RDW: 13.7 % (ref 11.5–15.5)
WBC Count: 5.4 10*3/uL (ref 4.0–10.5)
nRBC: 0 % (ref 0.0–0.2)

## 2020-09-29 LAB — BASIC METABOLIC PANEL
Anion gap: 7 (ref 5–15)
BUN: 14 mg/dL (ref 8–23)
CO2: 30 mmol/L (ref 22–32)
Calcium: 9.8 mg/dL (ref 8.9–10.3)
Chloride: 105 mmol/L (ref 98–111)
Creatinine, Ser: 1.05 mg/dL — ABNORMAL HIGH (ref 0.44–1.00)
GFR, Estimated: 54 mL/min — ABNORMAL LOW (ref >60)
Glucose, Bld: 202 mg/dL — ABNORMAL HIGH (ref 70–99)
Potassium: 3.9 mmol/L (ref 3.5–5.1)
Sodium: 142 mmol/L (ref 135–145)

## 2020-09-29 NOTE — Patient Instructions (Signed)
This is a grade 1 endometrioid endometrial cancer.  We do not yet know the stage. The MRI that we have ordered will help tell us this. There are 2 options for treatment. 1/ D&C with placement of a progesterone releasing IUD for 5 years. We would sample the endometrium in 3 months to check for regression of cancer. The benefit of this approach is that it does not require general anesthesia and may be safer with your diabetes and brain calcifications. 2/ hysterectomy with removal of tubes and ovaries and sampling of pelvic lymph nodes. This is performed through small incisions on the abdomen but requires general anesthesia and positioning in which you are tilted towards your head. We will discuss with your neurologist and cardiologist as to whether or not this approach would be safe for you.  We are checking labs for your blood counts, kidney function and diabetes levels (hemoglobin A1c).   Dr Denman George will schedule a phone visit with you to discuss the results and determine the next plan of care. Please have anybody you would like to hear this discussion present for the discussion.

## 2020-09-29 NOTE — H&P (View-Only) (Signed)
Consult Note: Gyn-Onc  Consult was requested by Dr. Rosana Hoes for the evaluation of [name] 69 y.o. female  CC:  Chief Complaint  Patient presents with  . Endometrial cancer    Assessment/Plan:  [name]  is a 69 y.o.  year old P3 with grade 1 endometrial cancer. She has significant medical history for subarachnoid hemorrhage with intracranial calcifications may contraindicate Trendelenburg positioning and potentially even general anesthesia.  Additionally she is coronary artery disease and poorly controlled diabetes mellitus that she cannot tolerate oral antiglycemic medications and declines insulin.  Therefore she may not be a good candidate for definitive surgery with hysterectomy.  I will first evaluate Isabella. [name] with an MRI of the pelvis to clinically stage her cancer and determine if there is obvious myometrial invasion or pelvic nodal involvement.  We will obtain labs including hemoglobin A1c to evaluate for control of her diabetes and renal function.  I will consult with her neurologist and cardiologist regarding optimization for surgery and if she is a candidate for Trendelenburg positioning for a 1 hour surgical procedure.  If she is determined to be a poor candidate for robotic assisted total hysterectomy and BSO with sentinel lymph node biopsy, she will be considered for a progestin releasing IUD with D&C.  This could potentially be accomplished via her conscious sedation.  I explained that if we follow this course of therapy she will have repeat sampling 3 months after insertion.  I explained that approximately 60% of patients with endometrial cancer will have progression upon repeat sampling.  HPI: [name] is a 69 year old P 3 who was seen in consultation at the request of Dr Rosana Hoes for evaluation and treatment of grade 1 endometrial cancer (MMR abnormal).   Her symptoms began with postmenopausal bleeding in late September 2021.  She  saw Dr Rosana Hoes for a new patient consultation on 08/26/20.  Work-up of symptoms included transvaginal US, and biopsy. Transvaginal US on 09/01/20 showed a uterus measuring 9.4 x 3.6 x 5.4 cm with an endometrial thickness of 6 mm.  The ovaries were grossly normal bilaterally. Endometrial sampling with endometrial pipelle biopsy was performed on 09/19/20 and showed 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  She has been having issues in the recent year with word finding difficulty and weakness for which she has had extensive neurologic work-up.  This included head imaging and it was felt that she had progressive calcifications in her brain.  She had been told in the past that she could not have general anesthesia due to her prior brain injury.  Additionally she is known to have coronary artery calcifications consistent with coronary artery disease in addition to history of a dysrhythmia for which she sees Dr. Irish Lack.   She has a history of nephrotic syndrome and chronic kidney disease.  She has a history of diabetes mellitus, type II, for which she was prescribed multiple oral antiglycemic's in the past none of which she tolerated.  She declined taking insulin.  Her last hemoglobin A1c was 6.9% in April 2021.  In addition to the above-stated issues she has hypothyroidism hypertension hyperlipidemia gastroesophageal reflux and asthma.    She lives with her granddaughter who is 68-1/2 years old.  Current Meds:  Outpatient Encounter Medications as of 09/29/2020  Medication Sig  . amitriptyline (ELAVIL) 10 MG tablet Take 10-20 mg by mouth at bedtime.  Marland Kitchen aspirin EC 81 MG tablet Take 1 tablet (81 mg total) by mouth daily. Swallow whole.  . bumetanide  (BUMEX) 1 MG tablet Take 1 mg by mouth every morning.  . cyanocobalamin (,VITAMIN B-12,) 1000 MCG/ML injection Inject 1 mL into the skin every 30 (thirty) days.  . Evolocumab (REPATHA SURECLICK) 808 MG/ML SOAJ Inject 1 pen into the skin every 14 (fourteen) days.  Marland Kitchen levothyroxine (SYNTHROID, LEVOTHROID) 50 MCG tablet Take 50 mcg by mouth daily.   . pantoprazole (PROTONIX) 40 MG tablet Take 1 tablet (40 mg total) by mouth daily.  . Potassium (POTASSIMIN PO) Take 99 mg by mouth daily.  . sertraline (ZOLOFT) 50 MG tablet Take 1 tablet by mouth daily in the afternoon.  . [DISCONTINUED] busPIRone (BUSPAR) 7.5 MG tablet Take 1 tablet by mouth daily in the afternoon.  . [DISCONTINUED] amitriptyline (ELAVIL) 25 MG tablet Take 25-50 mg by mouth at bedtime.   . [DISCONTINUED] buPROPion (WELLBUTRIN SR) 150 MG 12 hr tablet Take 150 mg by mouth 2 (two) times daily. (Patient not taking: Reported on 09/19/2020)  . [DISCONTINUED] buPROPion (WELLBUTRIN XL) 150 MG 24 hr tablet Take 1 tablet by mouth daily in the afternoon.  . [DISCONTINUED] Cyanocobalamin (B-12 COMPLIANCE INJECTION) 1000 MCG/ML KIT Inject as directed every 30 (thirty) days.  . [DISCONTINUED] eszopiclone (LUNESTA) 1 MG TABS tablet Take 1 mg by mouth at bedtime as needed for sleep. Take immediately before bedtime (Patient not taking: Reported on 09/19/2020)   No facility-administered encounter medications on file as of 09/29/2020.    Allergy:  Allergies  Allergen Reactions  . Acarbose   . Atenolol     Stomach problems, chills  . Atorvastatin   . Cinnamon     Stomach pain  . Crestor [Rosuvastatin Calcium]   . Glimepiride   . Invokana [Canagliflozin]   . Januvia [Sitagliptin]   . Losartan Potassium   . Metformin And Related Nausea And Vomiting and Other (See Comments)    Patient states that she has severe chills, headache and cramping additionally. Says blood sugar is uncontrolled because of this  . Other Other (See Comments)    ALL  DIABETIC MEDS PER PT   . Pioglitazone Other (See Comments)    Unknown   . Pravastatin   . Sulfamethoxazole Other (See Comments)    REACTION: unspecified  . Sulfa Antibiotics Rash and Other (See Comments)    Mother has told patient in the past not to take, she cannot recall there reaction    Social Hx:   Social History   Socioeconomic History  . Marital status: Legally Separated    Spouse name: Not on file  . Number of children: 3  . Years of education: 38  . Highest education level: Not on file  Occupational History  . Occupation: Retired   Tobacco Use  . Smoking status: Never Smoker  . Smokeless tobacco: Never Used  Vaping Use  . Vaping Use: Never used  Substance and Sexual Activity  . Alcohol use: No    Comment: quit 1978  . Drug use: No  . Sexual activity: Not Currently  Other Topics Concern  . Not on file  Social History Narrative   Lives at home. Her granddaughter lives with her.    Right handed   Social Determinants of Health   Financial Resource Strain:   . Difficulty  of Paying Living Expenses: Not on file  Food Insecurity:   . Worried About Charity fundraiser in the Last Year: Not on file  . Ran Out of Food in the Last Year: Not on file  Transportation Needs:   . Lack of Transportation (Medical): Not on file  . Lack of Transportation (Non-Medical): Not on file  Physical Activity:   . Days of Exercise per Week: Not on file  . Minutes of Exercise per Session: Not on file  Stress:   . Feeling of Stress : Not on file  Social Connections:   . Frequency of Communication with Friends and Family: Not on file  . Frequency of Social Gatherings with Friends and Family: Not on file  . Attends Religious Services: Not on file  . Active Member of Clubs or Organizations: Not on file  . Attends Archivist Meetings: Not on file  . Marital Status: Not on file  Intimate Partner Violence:   . Fear of Current or Ex-Partner: Not on file  . Emotionally Abused:  Not on file  . Physically Abused: Not on file  . Sexually Abused: Not on file    Past Surgical Hx:  Past Surgical History:  Procedure Laterality Date  . CHOLECYSTECTOMY  1983  . COLONOSCOPY  2008   DB  . [place] OF UTERUS  1972  . HYSTEROSCOPY WITH D & C  06/29/2011   Procedure: DILATATION AND CURETTAGE (D&C) /HYSTEROSCOPY;  Surgeon: Donnamae Jude, MD;  Location: [facility];  Service: Gynecology;  Laterality: N/A;    Past Medical Hx:  Past Medical History:  Diagnosis Date  . Allergy   . Anemia   . Asthma    Dianosed years ago-no meds at this time  . Cataract    Right eye removed-still has left cataract   . 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 Diabetes mellitus   . Dysrhythmia    "skips beats"  . Fibromyalgia 1980's  . Generalized anxiety disorder   . GERD (gastroesophageal reflux disease)    occ- will use Tums and Prolosec  prn   . Hyperlipemia   . Hypertension   . Hypothyroidism   . Irritable bowel syndrome 1980's  . Major depressive disorder   . Migraine headaches 1980's   On meds-well controlled  . 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  . Morbid obesity ([place])   . Pneumonia 2009   Several times over the past several years  . PONV (postoperative nausea and vomiting)   . Recurrent upper respiratory infection (URI)   . Sleep apnea    no cpap  . Subarachnoid hemorrhage ([place]) 2010  . Vaginal polyp    benign per pt    Past Gynecological History:  P3 No LMP recorded. Patient is postmenopausal.  Family Hx:  Family History  Problem Relation Age of Onset  . Liver cancer Father        mets to liver   . Bladder Cancer Father   . Colon cancer Father        mets to colon   . Anesthesia problems Mother        hard to wake post op   . High blood pressure Other   . Diabetes Other   . Arthritis Other   . Anesthesia problems Son        stopped breathing post op   . Tremor Son   . Schizophrenia  Daughter  Schizoaffective disorder  . Anesthesia problems Granddaughter        PONV  . Dementia Neg Hx   . Esophageal cancer Neg Hx   . Rectal cancer Neg Hx   . Stomach cancer Neg Hx   . Colon polyps Neg Hx     Review of Systems:  Constitutional  Feels weak  ENT Normal appearing ears and nares bilaterally Skin/Breast  No rash, sores, jaundice, itching, dryness Cardiovascular  + arrhythmia Pulmonary  No cough or wheeze.  Gastro Intestinal  No nausea, vomitting, or diarrhoea. No bright red blood per rectum, no abdominal pain, change in bowel movement, or constipation.  Genito Urinary  No frequency, urgency, dysuria, + postmenopausal bleeding Musculo Skeletal  + arthritis Neurologic  + tremor and word finding difficulty Psychology  + anxiety  Vitals:  Blood pressure (!) 162/75, pulse 96, temperature 98.1 F (36.7 C), temperature source Tympanic, resp. rate 18, height 5' 2.25" (1.581 m), SpO2 100 %.  Physical Exam: WD in NAD Neck  Supple NROM, without any enlargements.  Lymph Node Survey No cervical supraclavicular or inguinal adenopathy Cardiovascular  Pulse normal rate, regularity and rhythm. S1 and S2 normal.  Lungs  Clear to auscultation bilateraly, without wheezes/crackles/rhonchi. Good air movement.  Skin  No rash/lesions/breakdown  Psychiatry  Alert and oriented to person, place, and time  Abdomen  Normoactive bowel sounds, abdomen soft, non-tender and obese without evidence of hernia. Open chole incision.  Back No CVA tenderness Genito Urinary  Vulva/vagina: Normal external female genitalia.  No lesions. No discharge or bleeding.  Bladder/urethra:  No lesions or masses, well supported bladder  Vagina: normal  Cervix: Normal appearing, no lesions.  Uterus:  Small, mobile, no parametrial involvement or nodularity.  Adnexa: no palpable masses. Rectal  deferred Extremities  No bilateral cyanosis, clubbing or edema.  60 minutes of total time was  spent for this patient encounter, including preparation, face-to-face counseling with the patient and coordination of care, and documentation of the encounter.   Thereasa Solo, MD  09/29/2020, 5:13 PM

## 2020-09-29 NOTE — Progress Notes (Signed)
Consult Note: Gyn-Onc  Consult was requested by Dr. Rosana Hoes for the evaluation of [name] 69 y.o. female  CC:  Chief Complaint  Patient presents with  . Endometrial cancer    Assessment/Plan:  [name]  is a 69 y.o.  year old P3 with grade 1 endometrial cancer. She has significant medical history for subarachnoid hemorrhage with intracranial calcifications may contraindicate Trendelenburg positioning and potentially even general anesthesia.  Additionally she is coronary artery disease and poorly controlled diabetes mellitus that she cannot tolerate oral antiglycemic medications and declines insulin.  Therefore she may not be a good candidate for definitive surgery with hysterectomy.  I will first evaluate Isabella. [place] with an MRI of the pelvis to clinically stage her cancer and determine if there is obvious myometrial invasion or pelvic nodal involvement.  We will obtain labs including hemoglobin A1c to evaluate for control of her diabetes and renal function.  I will consult with her neurologist and cardiologist regarding optimization for surgery and if she is a candidate for Trendelenburg positioning for a 1 hour surgical procedure.  If she is determined to be a poor candidate for robotic assisted total hysterectomy and BSO with sentinel lymph node biopsy, she will be considered for a progestin releasing IUD with D&C.  This could potentially be accomplished via her conscious sedation.  I explained that if we follow this course of therapy she will have repeat sampling 3 months after insertion.  I explained that approximately 60% of patients with endometrial cancer will have progression upon repeat sampling.  HPI: [name] is a 69 year old P 3 who was seen in consultation at the request of Dr Rosana Hoes for evaluation and treatment of grade 1 endometrial cancer (MMR abnormal).   Her symptoms began with postmenopausal bleeding in late September 2021.  She  saw Dr Rosana Hoes for a new patient consultation on 08/26/20.  Work-up of symptoms included transvaginal US, and biopsy. Transvaginal US on 09/01/20 showed a uterus measuring 9.4 x 3.6 x 5.4 cm with an endometrial thickness of 6 mm.  The ovaries were grossly normal bilaterally. Endometrial sampling with endometrial pipelle biopsy was performed on 09/19/20 and showed 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  She has been having issues in the recent year with word finding difficulty and weakness for which she has had extensive neurologic work-up.  This included head imaging and it was felt that she had progressive calcifications in her brain.  She had been told in the past that she could not have general anesthesia due to her prior brain injury.  Additionally she is known to have coronary artery calcifications consistent with coronary artery disease in addition to history of a dysrhythmia for which she sees Dr. Irish Lack.   She has a history of nephrotic syndrome and chronic kidney disease.  She has a history of diabetes mellitus, type II, for which she was prescribed multiple oral antiglycemic's in the past none of which she tolerated.  She declined taking insulin.  Her last hemoglobin A1c was 6.9% in April 2021.  In addition to the above-stated issues she has hypothyroidism hypertension hyperlipidemia gastroesophageal reflux and asthma.    She lives with her granddaughter who is 57-1/2 years old.  Current Meds:  Outpatient Encounter Medications as of 09/29/2020  Medication Sig  . amitriptyline (ELAVIL) 10 MG tablet Take 10-20 mg by mouth at bedtime.  Marland Kitchen aspirin EC 81 MG tablet Take 1 tablet (81 mg total) by mouth daily. Swallow whole.  . bumetanide  (BUMEX) 1 MG tablet Take 1 mg by mouth every morning.  . cyanocobalamin (,VITAMIN B-12,) 1000 MCG/ML injection Inject 1 mL into the skin every 30 (thirty) days.  . Evolocumab (REPATHA SURECLICK) 921 MG/ML SOAJ Inject 1 pen into the skin every 14 (fourteen) days.  Marland Kitchen levothyroxine (SYNTHROID, LEVOTHROID) 50 MCG tablet Take 50 mcg by mouth daily.   . pantoprazole (PROTONIX) 40 MG tablet Take 1 tablet (40 mg total) by mouth daily.  . Potassium (POTASSIMIN PO) Take 99 mg by mouth daily.  . sertraline (ZOLOFT) 50 MG tablet Take 1 tablet by mouth daily in the afternoon.  . [DISCONTINUED] busPIRone (BUSPAR) 7.5 MG tablet Take 1 tablet by mouth daily in the afternoon.  . [DISCONTINUED] amitriptyline (ELAVIL) 25 MG tablet Take 25-50 mg by mouth at bedtime.   . [DISCONTINUED] buPROPion (WELLBUTRIN SR) 150 MG 12 hr tablet Take 150 mg by mouth 2 (two) times daily. (Patient not taking: Reported on 09/19/2020)  . [DISCONTINUED] buPROPion (WELLBUTRIN XL) 150 MG 24 hr tablet Take 1 tablet by mouth daily in the afternoon.  . [DISCONTINUED] Cyanocobalamin (B-12 COMPLIANCE INJECTION) 1000 MCG/ML KIT Inject as directed every 30 (thirty) days.  . [DISCONTINUED] eszopiclone (LUNESTA) 1 MG TABS tablet Take 1 mg by mouth at bedtime as needed for sleep. Take immediately before bedtime (Patient not taking: Reported on 09/19/2020)   No facility-administered encounter medications on file as of 09/29/2020.    Allergy:  Allergies  Allergen Reactions  . Acarbose   . Atenolol     Stomach problems, chills  . Atorvastatin   . Cinnamon     Stomach pain  . Crestor [Rosuvastatin Calcium]   . Glimepiride   . Invokana [Canagliflozin]   . Januvia [Sitagliptin]   . Losartan Potassium   . Metformin And Related Nausea And Vomiting and Other (See Comments)    Patient states that she has severe chills, headache and cramping additionally. Says blood sugar is uncontrolled because of this  . Other Other (See Comments)    ALL  DIABETIC MEDS PER PT   . Pioglitazone Other (See Comments)    Unknown   . Pravastatin   . Sulfamethoxazole Other (See Comments)    REACTION: unspecified  . Sulfa Antibiotics Rash and Other (See Comments)    Mother has told patient in the past not to take, she cannot recall there reaction    Social Hx:   Social History   Socioeconomic History  . Marital status: Legally Separated    Spouse name: Not on file  . Number of children: 3  . Years of education: 41  . Highest education level: Not on file  Occupational History  . Occupation: Retired   Tobacco Use  . Smoking status: Never Smoker  . Smokeless tobacco: Never Used  Vaping Use  . Vaping Use: Never used  Substance and Sexual Activity  . Alcohol use: No    Comment: quit 1978  . Drug use: No  . Sexual activity: Not Currently  Other Topics Concern  . Not on file  Social History Narrative   Lives at home. Her granddaughter lives with her.    Right handed   Social Determinants of Health   Financial Resource Strain:   . Difficulty  of Paying Living Expenses: Not on file  Food Insecurity:   . Worried About Charity fundraiser in the Last Year: Not on file  . Ran Out of Food in the Last Year: Not on file  Transportation Needs:   . Lack of Transportation (Medical): Not on file  . Lack of Transportation (Non-Medical): Not on file  Physical Activity:   . Days of Exercise per Week: Not on file  . Minutes of Exercise per Session: Not on file  Stress:   . Feeling of Stress : Not on file  Social Connections:   . Frequency of Communication with Friends and Family: Not on file  . Frequency of Social Gatherings with Friends and Family: Not on file  . Attends Religious Services: Not on file  . Active Member of Clubs or Organizations: Not on file  . Attends Archivist Meetings: Not on file  . Marital Status: Not on file  Intimate Partner Violence:   . Fear of Current or Ex-Partner: Not on file  . Emotionally Abused:  Not on file  . Physically Abused: Not on file  . Sexually Abused: Not on file    Past Surgical Hx:  Past Surgical History:  Procedure Laterality Date  . CHOLECYSTECTOMY  1983  . COLONOSCOPY  2008   DB  . [place] OF UTERUS  1972  . HYSTEROSCOPY WITH D & C  06/29/2011   Procedure: DILATATION AND CURETTAGE (D&C) /HYSTEROSCOPY;  Surgeon: Donnamae Jude, MD;  Location: [facility];  Service: Gynecology;  Laterality: N/A;    Past Medical Hx:  Past Medical History:  Diagnosis Date  . Allergy   . Anemia   . Asthma    Dianosed years ago-no meds at this time  . Cataract    Right eye removed-still has left cataract   . 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 Diabetes mellitus   . Dysrhythmia    "skips beats"  . Fibromyalgia 1980's  . Generalized anxiety disorder   . GERD (gastroesophageal reflux disease)    occ- will use Tums and Prolosec  prn   . Hyperlipemia   . Hypertension   . Hypothyroidism   . Irritable bowel syndrome 1980's  . Major depressive disorder   . Migraine headaches 1980's   On meds-well controlled  . 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  . Morbid obesity ([place])   . Pneumonia 2009   Several times over the past several years  . PONV (postoperative nausea and vomiting)   . Recurrent upper respiratory infection (URI)   . Sleep apnea    no cpap  . Subarachnoid hemorrhage ([place]) 2010  . Vaginal polyp    benign per pt    Past Gynecological History:  P3 No LMP recorded. Patient is postmenopausal.  Family Hx:  Family History  Problem Relation Age of Onset  . Liver cancer Father        mets to liver   . Bladder Cancer Father   . Colon cancer Father        mets to colon   . Anesthesia problems Mother        hard to wake post op   . High blood pressure Other   . Diabetes Other   . Arthritis Other   . Anesthesia problems Son        stopped breathing post op   . Tremor Son   . Schizophrenia  Daughter  Schizoaffective disorder  . Anesthesia problems Granddaughter        PONV  . Dementia Neg Hx   . Esophageal cancer Neg Hx   . Rectal cancer Neg Hx   . Stomach cancer Neg Hx   . Colon polyps Neg Hx     Review of Systems:  Constitutional  Feels weak  ENT Normal appearing ears and nares bilaterally Skin/Breast  No rash, sores, jaundice, itching, dryness Cardiovascular  + arrhythmia Pulmonary  No cough or wheeze.  Gastro Intestinal  No nausea, vomitting, or diarrhoea. No bright red blood per rectum, no abdominal pain, change in bowel movement, or constipation.  Genito Urinary  No frequency, urgency, dysuria, + postmenopausal bleeding Musculo Skeletal  + arthritis Neurologic  + tremor and word finding difficulty Psychology  + anxiety  Vitals:  Blood pressure (!) 162/75, pulse 96, temperature 98.1 F (36.7 C), temperature source Tympanic, resp. rate 18, height 5' 2.25" (1.581 m), SpO2 100 %.  Physical Exam: WD in NAD Neck  Supple NROM, without any enlargements.  Lymph Node Survey No cervical supraclavicular or inguinal adenopathy Cardiovascular  Pulse normal rate, regularity and rhythm. S1 and S2 normal.  Lungs  Clear to auscultation bilateraly, without wheezes/crackles/rhonchi. Good air movement.  Skin  No rash/lesions/breakdown  Psychiatry  Alert and oriented to person, place, and time  Abdomen  Normoactive bowel sounds, abdomen soft, non-tender and obese without evidence of hernia. Open chole incision.  Back No CVA tenderness Genito Urinary  Vulva/vagina: Normal external female genitalia.  No lesions. No discharge or bleeding.  Bladder/urethra:  No lesions or masses, well supported bladder  Vagina: normal  Cervix: Normal appearing, no lesions.  Uterus:  Small, mobile, no parametrial involvement or nodularity.  Adnexa: no palpable masses. Rectal  deferred Extremities  No bilateral cyanosis, clubbing or edema.  60 minutes of total time was  spent for this patient encounter, including preparation, face-to-face counseling with the patient and coordination of care, and documentation of the encounter.   Thereasa Solo, MD  09/29/2020, 5:13 PM

## 2020-09-30 ENCOUNTER — Telehealth: Payer: Self-pay | Admitting: Neurology

## 2020-09-30 ENCOUNTER — Telehealth: Payer: Self-pay | Admitting: Oncology

## 2020-09-30 LAB — HEMOGLOBIN A1C
Hgb A1c MFr Bld: 7.2 % — ABNORMAL HIGH (ref 4.8–5.6)
Mean Plasma Glucose: 160 mg/dL

## 2020-09-30 NOTE — Telephone Encounter (Signed)
Aaradhya called back and verified appointment with Dr. Irish Lack on 10/03/20.

## 2020-09-30 NOTE — Telephone Encounter (Signed)
[name] from the [facility] called stating that the pt has been diagnosed with Endometrial Cancer and is needing a Hysterectomy, but they are needing to speak to the provider due to the pt stating that she was informed in the past that she is not supposed to get General Anesthesia. Dr. Denman George is wanting to know if this is true. Also Dr. Denman George would like to know if the pt would be able to be in the Trendelenburg position for 60 min. Please advise.

## 2020-09-30 NOTE — Telephone Encounter (Signed)
Left a message for Dr. Jaynee Eagles with [facility] asking if patient is ok to receive general anesthesia and be in trendelenburg position for 60 minutes during surgery.

## 2020-09-30 NOTE — Telephone Encounter (Signed)
Called [facility] - Dr. Hassell Done office regarding the need for cardiac testing/stress test prior to surgery.  Appointment with Dr. Irish Lack has been scheduled for 10/03/2020 at 9:20.  Left a message for Corrine and her daughter with appointment information.  Requested a return call to confirm.

## 2020-10-02 NOTE — Progress Notes (Signed)
Cardiology Office Note   Date:  10/03/2020   ID:  [name], [name] 26-May-1951, MRN [id number]  PCP:  Hulan Fess, MD    No chief complaint on file.  Palpitations  Wt Readings from Last 3 Encounters:  10/03/20 179 lb (81.2 kg)  09/29/20 178 lb 9.6 oz (81 kg)  09/19/20 136 lb 8 oz (61.9 kg)       History of Present Illness: [name] is a 69 y.o. female  who has had a complex medical history including issues with falls and a brain calcification.  She was worked up for Parkinson's due to a fall and tremor.  She is following up with neurology.  She noted in August 2020 that she was retaining some fluid in her legs.  She tried furosemide but this did not help much.  She also noted that her blood pressure cuff was giving her a "heart symbol."  She was feeling some palpitations.  She had some random, atypical chest pain as well.  She has been allergic to several blood pressure and diabetes medications.   In January 2021, monitor showed:NSR with occasional PACs and PVCs.  Rare short bursts of tachycardia lasting only seconds.  No sustained arrhythmias. No atrial fibrillation.  In 2021, she had an episode of stroke like sx.  She was taken to [facility].  She was diagnosed with low potassium, B12.   Coronary CTA  In 5/21 showed:  " Three vessel mild non-obstructive CAD, CADRADS = 2.  2. Coronary calcium score of 879. This was 96th percentile for age and sex matched control.  3.  Normal coronary origin with right dominance.  4.  PFO present with evidence of left to right flow."  Since the last visit, she was diagnosed with endometrial cancer.  THis has ben stressful.  SHe has had diarrhea worked up with GI.   From the cardiac standpoint, she has been doing well.   Denies : Chest pain. Dizziness. Leg edema. Nitroglycerin use. Orthopnea. Paroxysmal nocturnal dyspnea. Shortness of breath. Syncope.     Past Medical History:  Diagnosis Date  .  Allergy   . Anemia   . Asthma    Dianosed years ago-no meds at this time  . Cataract    Right eye removed-still has left cataract   . 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 Diabetes mellitus   . Dysrhythmia    "skips beats"  . Fibromyalgia 1980's  . Generalized anxiety disorder   . GERD (gastroesophageal reflux disease)    occ- will use Tums and Prolosec  prn   . Hyperlipemia   . Hypertension   . Hypothyroidism   . Irritable bowel syndrome 1980's  . Major depressive disorder   . Migraine headaches 1980's   On meds-well controlled  . 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  . Morbid obesity ([place])   . Pneumonia 2009   Several times over the past several years  . PONV (postoperative nausea and vomiting)   . Recurrent upper respiratory infection (URI)   . Sleep apnea    no cpap  . Subarachnoid hemorrhage ([place]) 2010  . Vaginal polyp    benign per pt    Past Surgical History:  Procedure Laterality Date  . CHOLECYSTECTOMY  1983  . COLONOSCOPY  2008   DB  . [place] OF UTERUS  1972  . HYSTEROSCOPY WITH D & C  06/29/2011   Procedure: DILATATION  AND CURETTAGE (D&C) /HYSTEROSCOPY;  Surgeon: Donnamae Jude, MD;  Location: [facility];  Service: Gynecology;  Laterality: N/A;     Current Outpatient Medications  Medication Sig Dispense Refill  . amitriptyline (ELAVIL) 10 MG tablet Take 10-20 mg by mouth at bedtime.    Marland Kitchen aspirin EC 81 MG tablet Take 1 tablet (81 mg total) by mouth daily. Swallow whole. 90 tablet 3  . bumetanide (BUMEX) 1 MG tablet Take 1 mg by mouth every morning.    . cyanocobalamin (,VITAMIN B-12,) 1000 MCG/ML injection Inject 1 mL into the skin every 30 (thirty) days.    . Evolocumab (REPATHA SURECLICK) 803 MG/ML SOAJ Inject 1 pen into the skin every 14 (fourteen) days. 6 pen 3  . levothyroxine (SYNTHROID, LEVOTHROID) 50 MCG tablet Take 50 mcg by mouth daily.     . pantoprazole (PROTONIX) 40 MG  tablet Take 1 tablet (40 mg total) by mouth daily. 30 tablet 11  . Potassium (POTASSIMIN PO) Take 99 mg by mouth daily.     No current facility-administered medications for this visit.    Allergies:   Acarbose, Atenolol, Atorvastatin, Cinnamon, Crestor [rosuvastatin calcium], Glimepiride, Invokana [canagliflozin], Januvia [sitagliptin], Losartan potassium, Metformin and related, Other, Pioglitazone, Pravastatin, Sulfamethoxazole, and Sulfa antibiotics    Social History:  The patient  reports that she has never smoked. She has never used smokeless tobacco. She reports that she does not drink alcohol and does not use drugs.   Family History:  The patient's \ family history includes Anesthesia problems in her granddaughter, mother, and son; Arthritis in an other family member; Bladder Cancer in her father; Colon cancer in her father; Diabetes in an other family member; High blood pressure in an other family member; Liver cancer in her father; Schizophrenia in her daughter; Tremor in her son.    ROS:  Please see the history of present illness.   Otherwise, review of systems are positive for vaginal bleeding; mild palpitations- improved.   All other systems are reviewed and negative.    PHYSICAL EXAM: VS:  BP (!) 144/82   Pulse 100   Ht 5' 2.25" (1.581 m)   Wt 179 lb (81.2 kg)   SpO2 96%   BMI 32.48 kg/m  , BMI Body mass index is 32.48 kg/m. GEN: Well nourished, well developed, in no acute distress  HEENT: normal  Neck: no JVD, carotid bruits, or masses Cardiac: RRR; no murmurs, rubs, or gallops,no edema  Respiratory:  Scant left basilar crackles, normal work of breathing GI: soft, nontender, nondistended, + BS MS: no deformity or atrophy  Skin: warm and dry, no rash Neuro:  Strength and sensation are intact Psych: euthymic mood, full affect   EKG:   The ekg ordered today demonstrates NSR, no ST changes   Recent Labs: 02/26/2020: Magnesium 1.9; TSH 0.917 09/02/2020: ALT  8 09/29/2020: BUN 14; Creatinine, Ser 1.05; Hemoglobin 13.1; Platelet Count 210; Potassium 3.9; Sodium 142   Lipid Panel    Component Value Date/Time   CHOL 132 09/02/2020 1456   TRIG 145 09/02/2020 1456   HDL 51 09/02/2020 1456   CHOLHDL 2.6 09/02/2020 1456   LDLCALC 56 09/02/2020 1456   LDLDIRECT 130 (H) 05/10/2020 1659     Other studies Reviewed: Additional studies/ records that were reviewed today with results demonstrating: 2020 echo reviewed- Normal LV/RV function.  Adequate valvular function despite some calcification.   ASSESSMENT AND PLAN:  1. Palpitations: Improved.  No AFib.  Only PACs and PVCs noted.  2. Coronary artery calcification: Nonobstructive CAD by CT scan.  Tolerating Repatha. 3. DM: A1C 7.2 in 10/21.  Managed with diet. She admits to eating poorly lately. 4. Fluid retention: Improved. COntrolled with daily Bumex.  5. She has gotten her two COVID vaccines. 6. HTN: Now off of meds.  Allergic or intolerant to multiple meds.  Controlling with low salt diet. 7. She reports a cough.  WIll check chest xray given some fine crackles at left base   Current medicines are reviewed at length with the patient today.  The patient concerns regarding her medicines were addressed.  The following changes have been made:  No change  Labs/ tests ordered today include:  No orders of the defined types were placed in this encounter.   Recommend 150 minutes/week of aerobic exercise Low fat, low carb, high fiber diet recommended  Disposition:   FU in 1 year   Signed, Larae Grooms, MD  10/03/2020 9:37 AM    [facility] [address], [place], [place]  [address] Phone: [phone]; Fax: [phone]

## 2020-10-03 ENCOUNTER — Encounter: Payer: Self-pay | Admitting: Interventional Cardiology

## 2020-10-03 ENCOUNTER — Telehealth: Payer: Self-pay

## 2020-10-03 ENCOUNTER — Other Ambulatory Visit: Payer: Self-pay

## 2020-10-03 ENCOUNTER — Ambulatory Visit
Admission: RE | Admit: 2020-10-03 | Discharge: 2020-10-03 | Disposition: A | Discharge status: Home / Self Care | Payer: Medicare HMO | Source: Ambulatory Visit | Attending: Interventional Cardiology | Admitting: Interventional Cardiology

## 2020-10-03 ENCOUNTER — Telehealth: Payer: Self-pay | Admitting: Interventional Cardiology

## 2020-10-03 ENCOUNTER — Telehealth: Payer: Self-pay | Admitting: Oncology

## 2020-10-03 ENCOUNTER — Ambulatory Visit (INDEPENDENT_AMBULATORY_CARE_PROVIDER_SITE_OTHER): Payer: Medicare HMO | Admitting: Interventional Cardiology

## 2020-10-03 VITALS — BP 144/82 | HR 100 | Ht 62.25 in | Wt 179.0 lb

## 2020-10-03 DIAGNOSIS — I25709 Atherosclerosis of coronary artery bypass graft(s), unspecified, with unspecified angina pectoris: Secondary | ICD-10-CM

## 2020-10-03 DIAGNOSIS — I872 Venous insufficiency (chronic) (peripheral): Secondary | ICD-10-CM | POA: Diagnosis not present

## 2020-10-03 DIAGNOSIS — R931 Abnormal findings on diagnostic imaging of heart and coronary circulation: Secondary | ICD-10-CM | POA: Diagnosis not present

## 2020-10-03 DIAGNOSIS — E1149 Type 2 diabetes mellitus with other diabetic neurological complication: Secondary | ICD-10-CM

## 2020-10-03 DIAGNOSIS — R059 Cough, unspecified: Secondary | ICD-10-CM | POA: Diagnosis not present

## 2020-10-03 DIAGNOSIS — R002 Palpitations: Secondary | ICD-10-CM | POA: Diagnosis not present

## 2020-10-03 DIAGNOSIS — D510 Vitamin B12 deficiency anemia due to intrinsic factor deficiency: Secondary | ICD-10-CM | POA: Diagnosis not present

## 2020-10-03 NOTE — Telephone Encounter (Signed)
   Primary Cardiologist: Larae Grooms, MD  Chart reviewed as part of pre-operative protocol coverage.  Kennady Kyndel Egger was last seen on 10/03/20 by Dr. Irish Lack and was cleared for surgery.   Therefore, based on ACC/AHA guidelines, the patient would be at acceptable risk for the planned procedure without further cardiovascular testing.     I will route this recommendation to the requesting party via Epic fax function and remove from pre-op pool. Please call with questions.  Ledora Bottcher, PA 10/03/2020, 5:48 PM

## 2020-10-03 NOTE — Patient Instructions (Signed)
Medication Instructions:  Your physician recommends that you continue on your current medications as directed. Please refer to the Current Medication list given to you today.  *If you need a refill on your cardiac medications before your next appointment, please call your pharmacy*   Lab Work: None  If you have labs (blood work) drawn today and your tests are completely normal, you will receive your results only by: Marland Kitchen MyChart Message (if you have MyChart) OR . A paper copy in the mail If you have any lab test that is abnormal or we need to change your treatment, we will call you to review the results.   Testing/Procedures: A chest x-ray takes a picture of the organs and structures inside the chest, including the heart, lungs, and blood vessels. This test can show several things, including, whether the heart is enlarges; whether fluid is building up in the lungs; and whether pacemaker / defibrillator leads are still in place.  Chest X-ray Instructions:    1. You may have this done at the [facility], located in the        [place] on the 1st floor.    2. You do no have to have an appointment.    3. [place],  [id number]        [phone]        Monday - Friday  8:00 am - 5:00 pm    Follow-Up: At [facility], you and your health needs are our priority.  As part of our continuing mission to provide you with exceptional heart care, we have created designated Provider Care Teams.  These Care Teams include your primary Cardiologist (physician) and Advanced Practice Providers (APPs -  Physician Assistants and Nurse Practitioners) who all work together to provide you with the care you need, when you need it.  We recommend signing up for the patient portal called "MyChart".  Sign up information is provided on this After Visit Summary.  MyChart is used to connect with patients for Virtual Visits (Telemedicine).  Patients are able  to view lab/test results, encounter notes, upcoming appointments, etc.  Non-urgent messages can be sent to your provider as well.   To learn more about what you can do with MyChart, go to [email or web address].    Your next appointment:   12 month(s)  The format for your next appointment:   In Person  Provider:   You may see Larae Grooms, MD or one of the following Advanced Practice Providers on your designated Care Team:    Melina Copa, PA-C  Ermalinda Barrios, PA-C    Other Instructions None

## 2020-10-03 NOTE — Telephone Encounter (Signed)
Called [facility] regarding clearance and risk stratification for surgery.

## 2020-10-03 NOTE — Telephone Encounter (Signed)
LM for [name] to call back to discuss the results of the Hgb A1c. The Hgb A1c was 7.2. Isabella John, NP said that they are awaiting input from cardiology and neurology to determine best plan of care for her.

## 2020-10-03 NOTE — Telephone Encounter (Signed)
   [facility] Pre-operative Risk Assessment    HEARTCARE STAFF: - Please ensure there is not already an duplicate clearance open for this procedure. - Under Visit Info/Reason for Call, type in Other and utilize the format Clearance MM/DD/YY or Clearance TBD. Do not use dashes or single digits. - If request is for dental extraction, please clarify the # of teeth to be extracted.  Request for surgical clearance:  1. What type of surgery is being performed? A total hysterectomy and bso with sentinel lymph node byospy  2. When is this surgery scheduled? TBD  3. What type of clearance is required (medical clearance vs. Pharmacy clearance to hold med vs. Both)? Medical  4. Are there any medications that need to be held prior to surgery and how long? No  5. Practice name and name of physician performing surgery? [facility], Dr. Denman George  6. What is the office phone number? [name] office navigator: [phone]    7.   What is the office fax number? [phone]  8.   Anesthesia type (None, local, MAC, general) ? general   Selena Zobro 10/03/2020, 2:53 PM  _________________________________________________________________   (provider comments below)

## 2020-10-03 NOTE — Telephone Encounter (Signed)
Dr. Irish Lack, We are asked for cardiac clearance for hysterectomy. When you saw her today, you mentioned CXR. Does she needs CXR prior to clearance?

## 2020-10-03 NOTE — Telephone Encounter (Signed)
It should have been done today.  She wanted it for cough.  No further cardiac tests needed.   JV

## 2020-10-04 DIAGNOSIS — E1122 Type 2 diabetes mellitus with diabetic chronic kidney disease: Secondary | ICD-10-CM | POA: Diagnosis not present

## 2020-10-04 DIAGNOSIS — I10 Essential (primary) hypertension: Secondary | ICD-10-CM | POA: Diagnosis not present

## 2020-10-04 DIAGNOSIS — N189 Chronic kidney disease, unspecified: Secondary | ICD-10-CM | POA: Diagnosis not present

## 2020-10-04 DIAGNOSIS — E782 Mixed hyperlipidemia: Secondary | ICD-10-CM | POA: Diagnosis not present

## 2020-10-04 DIAGNOSIS — E039 Hypothyroidism, unspecified: Secondary | ICD-10-CM | POA: Diagnosis not present

## 2020-10-04 DIAGNOSIS — E1129 Type 2 diabetes mellitus with other diabetic kidney complication: Secondary | ICD-10-CM | POA: Diagnosis not present

## 2020-10-04 DIAGNOSIS — E785 Hyperlipidemia, unspecified: Secondary | ICD-10-CM | POA: Diagnosis not present

## 2020-10-04 DIAGNOSIS — E1169 Type 2 diabetes mellitus with other specified complication: Secondary | ICD-10-CM | POA: Diagnosis not present

## 2020-10-04 DIAGNOSIS — E1165 Type 2 diabetes mellitus with hyperglycemia: Secondary | ICD-10-CM | POA: Diagnosis not present

## 2020-10-04 NOTE — Telephone Encounter (Signed)
I have not seen patient in almost a year so I cannot comment from a neurologic perspective. I transferred her care to [facility]. Thank you

## 2020-10-04 NOTE — Telephone Encounter (Signed)
Spoke with the patient.  She has been diagnosed with endometrial cancer. She is getting clearance for surgery. She is getting the MRI for staging very soon. She states the diarrhea has become less of an issue. She has difficultly with having a bowel movement every 2 to 3 days. She does not take anything to move her bowels, but rather allows her body to take care of it. She asks if she will have to get a colonoscopy. I told her that is a possibility and may depend on what her imaging shows or what is found in surgery. She wanted to let you know what is happening. She did not have any GI issues she wanted addressed at this time.

## 2020-10-04 NOTE — Telephone Encounter (Signed)
Informed [name] of the results and information noted below.

## 2020-10-04 NOTE — Telephone Encounter (Signed)
Pt is requesting a call back from Beth  

## 2020-10-04 NOTE — Telephone Encounter (Signed)
She is up-to-date with colorectal cancer screening, last colonoscopy was in 2019.  She will likely not need a colonoscopy prior to surgery unless has any findings on imaging that may warrant the exam. To prevent worsening constipation postop, please advise patient to use MiraLAX 1 capful daily, can increase the dose based on response.  Increase dietary fiber and water intake.  Call us back with any worsening GI symptoms or concerns.  Thank you

## 2020-10-04 NOTE — Telephone Encounter (Signed)
Dr. Denman George: I received this message. Patient has not followed up in almost a year with [name] so I cannot comment from a neurologic perspective. I transferred her care to [facility]. Thank you

## 2020-10-05 ENCOUNTER — Encounter: Payer: Self-pay | Admitting: Oncology

## 2020-10-05 ENCOUNTER — Telehealth: Payer: Self-pay | Admitting: Gastroenterology

## 2020-10-05 NOTE — Telephone Encounter (Signed)
Called the patient. No answer. I left this information on her voicemail.

## 2020-10-05 NOTE — Telephone Encounter (Signed)
I called [name] back and confirmed she saw Dr Cathren Laine note already. She will contact [facility]. She verbalized appreciation for the call.

## 2020-10-05 NOTE — Telephone Encounter (Signed)
Spoke with patient about the previous message. No further questions.

## 2020-10-05 NOTE — Progress Notes (Signed)
Left a message for Dr. Normajean Baxter nurse at [facility] regarding surgical clearance questions.  Also faxed surgical clearance form.

## 2020-10-06 ENCOUNTER — Encounter: Payer: Self-pay | Admitting: *Deleted

## 2020-10-06 DIAGNOSIS — F411 Generalized anxiety disorder: Secondary | ICD-10-CM | POA: Diagnosis not present

## 2020-10-06 DIAGNOSIS — F329 Major depressive disorder, single episode, unspecified: Secondary | ICD-10-CM | POA: Diagnosis not present

## 2020-10-10 ENCOUNTER — Other Ambulatory Visit: Payer: Self-pay

## 2020-10-10 ENCOUNTER — Telehealth: Payer: Self-pay | Admitting: Gynecologic Oncology

## 2020-10-10 ENCOUNTER — Ambulatory Visit (HOSPITAL_COMMUNITY): Payer: Medicare HMO

## 2020-10-10 ENCOUNTER — Ambulatory Visit (HOSPITAL_COMMUNITY)
Admission: RE | Admit: 2020-10-10 | Discharge: 2020-10-10 | Disposition: A | Discharge status: Home / Self Care | Payer: Medicare HMO | Source: Ambulatory Visit | Attending: Gynecologic Oncology | Admitting: Gynecologic Oncology

## 2020-10-10 DIAGNOSIS — D252 Subserosal leiomyoma of uterus: Secondary | ICD-10-CM | POA: Diagnosis not present

## 2020-10-10 DIAGNOSIS — N8500 Endometrial hyperplasia, unspecified: Secondary | ICD-10-CM | POA: Diagnosis not present

## 2020-10-10 DIAGNOSIS — C541 Malignant neoplasm of endometrium: Secondary | ICD-10-CM

## 2020-10-10 DIAGNOSIS — K5730 Diverticulosis of large intestine without perforation or abscess without bleeding: Secondary | ICD-10-CM | POA: Diagnosis not present

## 2020-10-10 MED ORDER — GADOBUTROL 1 MMOL/ML IV SOLN
8.0000 mL | Freq: Once | INTRAVENOUS | Status: AC | PRN
  Administered 2020-10-10: 8 mL via INTRAVENOUS

## 2020-10-11 ENCOUNTER — Inpatient Hospital Stay (HOSPITAL_BASED_OUTPATIENT_CLINIC_OR_DEPARTMENT_OTHER): Payer: Medicare HMO | Admitting: Gynecologic Oncology

## 2020-10-11 ENCOUNTER — Telehealth: Payer: Self-pay | Admitting: Oncology

## 2020-10-11 ENCOUNTER — Telehealth: Payer: Self-pay | Admitting: *Deleted

## 2020-10-11 DIAGNOSIS — C541 Malignant neoplasm of endometrium: Secondary | ICD-10-CM | POA: Diagnosis not present

## 2020-10-11 DIAGNOSIS — Z7189 Other specified counseling: Secondary | ICD-10-CM

## 2020-10-11 NOTE — Progress Notes (Signed)
Gynecologic Oncology Telehealth Follow-up Note  I connected with Penda Venturi on 10/11/20 at  3:00 PM EDT by telephone and verified that I am speaking with the correct person using two identifiers.  I discussed the limitations, risks, security and privacy concerns of performing an evaluation and management service by telemedicine and the availability of in-person appointments. I also discussed with the patient that there may be a patient responsible charge related to this service. The patient expressed understanding and agreed to proceed.  Other persons participating in the visit and their role in the encounter: Sister, son, grand daughter.  Patient's location: home Provider's location: [facility]  Chief Complaint: endometrial cancer   Assessment/Plan:  [name]  is a 69 y.o.  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 She has significant medical history for subarachnoid hemorrhage with intracranial calcifications, coronary artery disease and diabetes mellitus that she cannot tolerate oral antiglycemic medications and declines insulin.  I have sought consultation with her neurologist and cardiologist for optimization.  Her cardiologist did not mention contraindication to surgery or recommendations for additional optimization.  Her neurologist did not feel comfortable weighing in regarding her intracranial calcifications.  However she has no aneurysms on imaging that was performed in March 2021, and a remote history of subarachnoid hemorrhage is not a contraindication to mild Trendelenburg positioning for hysterectomy.  She is not a good candidate for progestin releasing IUD and D&C due to the deeply invasive tumor on MRI imaging.  I offered her definitive radiation with external beam and intracavitary brachytherapy.  I explained that this would avoid surgical risks and complications, however carries with it risks of long-term  morbidity of bowel and bladder dysfunction, as well as challenges in treating recurrences particularly those that occur within the radiation field.  After spending approximately 45 minutes with detailed consultation with the patient and her family, they determined they would like to proceed with surgery with robotic assisted total hysterectomy and BSO and sentinel lymph node biopsy.  I explained that we will plan on outpatient surgery with same-day discharge if she is meeting criteria and has done well perioperatively.  However if she is not meeting discharge criteria in the PACU, she will be admitted for observation.    HPI: [name] is a 69 year old P 3 who was seen in consultation at the request of Dr Rosana Hoes for evaluation and treatment of grade 1 endometrial cancer (MMR abnormal).   Her symptoms began with postmenopausal bleeding in late September 2021.  She saw Dr Rosana Hoes for a new patient consultation on 08/26/20.  Work-up of symptoms included transvaginal US, and biopsy. Transvaginal US on 09/01/20 showed a uterus measuring 9.4 x 3.6 x 5.4 cm with an endometrial thickness of 6 mm.  The ovaries were grossly normal bilaterally. Endometrial sampling with endometrial pipelle biopsy was performed on 09/19/20 and showed 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  She has been having issues in the recent year with word finding difficulty and weakness for which she has had extensive neurologic work-up.  This included head imaging and it was felt that she had progressive calcifications in her brain.  She had been told in the past that she could not  have general anesthesia due to her prior brain injury.  Additionally she is known to have coronary  artery calcifications consistent with coronary artery disease in addition to history of a dysrhythmia for which she sees Dr. Irish Lack.   She has a history of nephrotic syndrome and chronic kidney disease.  She has a history of diabetes mellitus, type II, for which she was prescribed multiple oral antiglycemic's in the past none of which she tolerated.  She declined taking insulin.  Her last hemoglobin A1c was 6.9% in April 2021.  In addition to the above-stated issues she has hypothyroidism hypertension hyperlipidemia gastroesophageal reflux and asthma.    She lives with her granddaughter who is 55-1/2 years old.  Interval Hx:  MRI was performed of the pelvis on 10/10/2020 and showed a deeply invasive myometrial extending tumor from the endometrium.  There was no apparent lymphadenopathy or extrauterine disease present, however this appeared to be at least clinical stage Ib.  She had seen her cardiologist for optimization of her stratification and was felt to be a good candidate for surgery by him.  Her neurologist was contacted regarding candidacy for surgery and Trendelenburg positioning with her prior intracranial calcifications.  Her neurologist declined to comment on risk for this.  Hemoglobin A1c on 09/30/2020 was 7.2%.  Current Meds:  Outpatient Encounter Medications as of 10/11/2020  Medication Sig  . amitriptyline (ELAVIL) 10 MG tablet Take 10-20 mg by mouth at bedtime.  Marland Kitchen aspirin EC 81 MG tablet Take 1 tablet (81 mg total) by mouth daily. Swallow whole.  . bumetanide (BUMEX) 1 MG tablet Take 1 mg by mouth every morning.  . cyanocobalamin (,VITAMIN B-12,) 1000 MCG/ML injection Inject 1 mL into the skin every 30 (thirty) days.  . Evolocumab (REPATHA SURECLICK) 270 MG/ML SOAJ Inject 1 pen into the skin every 14 (fourteen) days.  Marland Kitchen levothyroxine (SYNTHROID, LEVOTHROID) 50 MCG tablet Take 50 mcg by mouth daily.   . pantoprazole (PROTONIX) 40 MG tablet Take 1 tablet (40 mg total) by mouth  daily.  . Potassium (POTASSIMIN PO) Take 99 mg by mouth daily.   No facility-administered encounter medications on file as of 10/11/2020.    Allergy:  Allergies  Allergen Reactions  . Acarbose   . Atenolol     Stomach problems, chills  . Atorvastatin   . Cinnamon     Stomach pain  . Crestor [Rosuvastatin Calcium]   . Glimepiride   . Invokana [Canagliflozin]   . Januvia [Sitagliptin]   . Losartan Potassium   . Metformin And Related Nausea And Vomiting and Other (See Comments)    Patient states that she has severe chills, headache and cramping additionally. Says blood sugar is uncontrolled because of this  . Other Other (See Comments)    ALL DIABETIC MEDS PER PT   . Pioglitazone Other (See Comments)    Unknown   . Pravastatin   . Sulfamethoxazole Other (See Comments)    REACTION: unspecified  . Sulfa Antibiotics Rash and Other (See Comments)    Mother has told patient in the past not to take, she cannot recall there reaction    Social Hx:   Social History   Socioeconomic History  . Marital status: Legally Separated    Spouse name: Not on file  . Number of children: 3  . Years of education: 2  . Highest education level: Not on file  Occupational History  . Occupation: Retired   Tobacco Use  . Smoking status: Never Smoker  . Smokeless  tobacco: Never Used  Vaping Use  . Vaping Use: Never used  Substance and Sexual Activity  . Alcohol use: No    Comment: quit 1978  . Drug use: No  . Sexual activity: Not Currently  Other Topics Concern  . Not on file  Social History Narrative   Lives at home. Her granddaughter lives with her.    Right handed   Social Determinants of Health   Financial Resource Strain:   . Difficulty of Paying Living Expenses: Not on file  Food Insecurity:   . Worried About Charity fundraiser in the Last Year: Not on file  . Ran Out of Food in the Last Year: Not on file  Transportation Needs:   . Lack of Transportation (Medical): Not on  file  . Lack of Transportation (Non-Medical): Not on file  Physical Activity:   . Days of Exercise per Week: Not on file  . Minutes of Exercise per Session: Not on file  Stress:   . Feeling of Stress : Not on file  Social Connections:   . Frequency of Communication with Friends and Family: Not on file  . Frequency of Social Gatherings with Friends and Family: Not on file  . Attends Religious Services: Not on file  . Active Member of Clubs or Organizations: Not on file  . Attends Archivist Meetings: Not on file  . Marital Status: Not on file  Intimate Partner Violence:   . Fear of Current or Ex-Partner: Not on file  . Emotionally Abused: Not on file  . Physically Abused: Not on file  . Sexually Abused: Not on file    Past Surgical Hx:  Past Surgical History:  Procedure Laterality Date  . CHOLECYSTECTOMY  1983  . COLONOSCOPY  2008   DB  . [place] OF UTERUS  1972  . HYSTEROSCOPY WITH D & C  06/29/2011   Procedure: DILATATION AND CURETTAGE (D&C) /HYSTEROSCOPY;  Surgeon: Donnamae Jude, MD;  Location: [facility];  Service: Gynecology;  Laterality: N/A;    Past Medical Hx:  Past Medical History:  Diagnosis Date  . Allergy   . Anemia   . Asthma    Dianosed years ago-no meds at this time  . Cataract    Right eye removed-still has left cataract   . 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 Diabetes mellitus   . Dysrhythmia    "skips beats"  . Fibromyalgia 1980's  . Generalized anxiety disorder   . GERD (gastroesophageal reflux disease)    occ- will use Tums and Prolosec  prn   . Hyperlipemia   . Hypertension   . Hypothyroidism   . Irritable bowel syndrome 1980's  . Major depressive disorder   . Migraine headaches 1980's   On meds-well controlled  . 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  . Morbid obesity ([place])   . Pneumonia 2009   Several times over the past several years  . PONV (postoperative  nausea and vomiting)   . Recurrent upper respiratory infection (URI)   . Sleep apnea    no cpap  . Subarachnoid hemorrhage ([place]) 2010  . Vaginal polyp    benign per pt    Past Gynecological History:  P3 No LMP recorded. Patient is postmenopausal.  Family Hx:  Family History  Problem Relation Age of Onset  . Liver cancer Father        mets to liver   .  Bladder Cancer Father   . Colon cancer Father        mets to colon   . Anesthesia problems Mother        hard to wake post op   . High blood pressure Other   . Diabetes Other   . Arthritis Other   . Anesthesia problems Son        stopped breathing post op   . Tremor Son   . Schizophrenia Daughter        Schizoaffective disorder  . Anesthesia problems Granddaughter        PONV  . Dementia Neg Hx   . Esophageal cancer Neg Hx   . Rectal cancer Neg Hx   . Stomach cancer Neg Hx   . Colon polyps Neg Hx     Review of Systems:  Constitutional  Feels weak  ENT Normal appearing ears and nares bilaterally Skin/Breast  No rash, sores, jaundice, itching, dryness Cardiovascular  + arrhythmia Pulmonary  No cough or wheeze.  Gastro Intestinal  No nausea, vomitting, or diarrhoea. No bright red blood per rectum, no abdominal pain, change in bowel movement, or constipation.  Genito Urinary  No frequency, urgency, dysuria, + postmenopausal bleeding Musculo Skeletal  + arthritis Neurologic  + tremor and word finding difficulty Psychology  + anxiety  Vitals:  There were no vitals taken for this visit.  Physical Exam: deferred  I discussed the assessment and treatment plan with the patient. The patient was provided with an opportunity to ask questions and all were answered. The patient agreed with the plan and demonstrated an understanding of the instructions.   The patient was advised to call back or see an in-person evaluation if the symptoms worsen or if the condition fails to improve as anticipated.   I provided 45  minutes of non face-to-face telephone visit time during this encounter, and > 50% was spent counseling as documented under my assessment & plan.    Thereasa Solo, MD  10/11/2020, 5:34 PM

## 2020-10-11 NOTE — Telephone Encounter (Signed)
Patient called to make sure that she is still having her phone visit today and what to except.

## 2020-10-11 NOTE — Telephone Encounter (Signed)
Left another message for Dr. Aundria Mems nurse with [facility] regarding questions about surgery.  Also refaxed surgical clearance form.

## 2020-10-11 NOTE — Telephone Encounter (Signed)
Received a message back from [place] with Dr. Aundria Mems office.  She said Dr. Vinnie Level saw the patient 1 time in April and then released her back to Dr. Jaynee Eagles.  She said we can send the surgical clearance form but that Dr. Vinnie Level may not address it because she has released the patient.

## 2020-10-12 ENCOUNTER — Ambulatory Visit: Payer: Medicare HMO | Admitting: Obstetrics and Gynecology

## 2020-10-12 ENCOUNTER — Telehealth: Payer: Self-pay

## 2020-10-12 NOTE — Telephone Encounter (Signed)
Told Ms Stankovich that her surgery will be on November 11,21 with Dr. Denman George.   She will receive a call from presurgical testing to give her an appointment time to get lab done at the hospital and an appointment for a covid test. She will be call by Dr. Serita Grit office with an appointment with Joylene John, NP to go over the surgery in more detail. Dr. Denman George wants her to stop her ASA now so it does not cause increased bleeding with her surgery. She will hold her dose for today 10-12-20 as she takes the ASA at night. Pt verbalized understanding.

## 2020-10-13 ENCOUNTER — Telehealth: Payer: Self-pay | Admitting: *Deleted

## 2020-10-13 NOTE — Telephone Encounter (Signed)
Called and left the patient a message to call the office back. Patient needs to be scheduled to see Melissa APP on Monday 11/1 in the morning for pre op

## 2020-10-14 NOTE — Progress Notes (Signed)
Patient here for a pre-operative appointment prior to her scheduled surgery on October 27, 2020. She is scheduled for a robotic assisted total laparoscopic hysterectomy (removal of the uterus and cervix), bilateral salpingo-oophorectomy (removal of both ovaries and fallopian tubes), sentinel lymph node biopsy, possible lymph node dissection.  She has her pre-admission testing appointment tomorrow am at [facility].  The surgery was discussed in detail.  See after visit summary for additional details. Visual aids used to discuss items related to surgery including sequential compression stockings, foley catheter, IV pump, multi-modal pain regimen including tylenol, photo of the surgical robot, female reproductive system to discuss surgery in detail. She has stopped her aspirin.   Discussed post-op pain management in detail including the aspects of the enhanced recovery pathway.  Advised her that a new prescription would be sent in for tramadol and it is only to be used for after her upcoming surgery.  We discussed the use of tylenol post-op and to monitor for a maximum of 4,000 mg in a 24 hour period.  Also prescribed sennakot to be used after surgery and to hold if having loose stools.  Discussed bowel regimen in detail given her recent hx of severe diarrhea and now constipation. She is concerned for GI malignancy given her bowel changes from diarrhea to constipation and would like further evaluation for this. She states she was given a medication to help with GI cramping during the episodes of severe diarrhea and states she had severe chills as a reaction. She will call the office if she finds the name for the medication so this can be added to your allergy/intolerance list.  She states she has been having increased chest pain and shortness of breath intermittently since stopping her aspirin. The chest pain is left sided under her breast with no other symptoms reported. It is intermittent and not related to  activity. The shortness of breath is not related to the chest pain and is intermittent as well. She reports being told she has a murmur and states she had an echo as well. She also voices concerns about the significant calcifications in her brain and being put to sleep and placed in trendelenburg for the surgery.     Discussed the use of SCDs, and measures to take at home to prevent DVT including frequent mobility.  Reportable signs and symptoms of DVT discussed. Post-operative instructions discussed and expectations for after surgery. Incisional care discussed as well including reportable signs and symptoms including erythema, drainage, wound separation.   45 minutes spent with the patient.  Verbalizing understanding of material discussed. No further concerns voiced at the end of the visit. She is concerned about surgical risks and is concerned about the wellbeing of her 93 year old granddaughter who she cares for fully. A message has been sent to her cardiologist, Dr. Irish Lack, to convey the patient's symptoms of chest pain/dyspnea. Per Dr. Denman George, if the patient is considered high risk and/or needs intervention for her chest pain such as cardiac cath or stent placement, there is a non surgical treatment option for the endometrial cancer in the form of radiation. We will also reach out to her GI for further evaluation of her GI symptoms. Advised patient to call for any needs.  Advised that her post-operative medications had been prescribed and could be picked up at any time.

## 2020-10-14 NOTE — Telephone Encounter (Signed)
Patient called back and scheduled an appt.

## 2020-10-14 NOTE — Patient Instructions (Signed)
Preparing for your Surgery  Plan for surgery on October 27, 2020 with Dr. Everitt Amber at [facility] will be scheduled for a robotic assisted total laparoscopic hysterectomy (removal of the uterus and cervix), bilateral salpingo-oophorectomy (removal of both ovaries and fallopian tubes), sentinel lymph node biopsy, possible lymph node dissection.   You should not be taking aspirin.  Pre-operative Testing -You will receive a phone call from presurgical testing at [facility] to arrange for a pre-operative appointment, lab appointment, and COVID test. The COVID test normally happens 3 days prior to the surgery and they ask that you self quarantine after the test up until surgery to decrease chance of exposure.  -Bring your insurance card, copy of an advanced directive if applicable, medication list  -At that visit, you will be asked to sign a consent for a possible blood transfusion in case a transfusion becomes necessary during surgery.  The need for a blood transfusion is rare but having consent is a necessary part of your care.     -You should not be taking blood thinners or aspirin at least ten days prior to surgery unless instructed by your surgeon.  -Do not take supplements such as fish oil (omega 3), red yeast rice, turmeric before your surgery.   Day Before Surgery at [place] will be asked to take in a light diet the day before surgery. You will be advised you can have clear liquids up until 3 hours before your surgery.    Eat a light diet the day before surgery.  Examples including soups, broths, toast, yogurt, mashed potatoes.  AVOID GAS PRODUCING FOODS. Things to avoid include carbonated beverages (fizzy beverages), raw fruits and raw vegetables, or beans.   If your bowels are filled with gas, your surgeon will have difficulty visualizing your pelvic organs which increases your surgical risks.  Your role in recovery Your role is to become active as soon as  directed by your doctor, while still giving yourself time to heal.  Rest when you feel tired. You will be asked to do the following in order to speed your recovery:  - Cough and breathe deeply. This helps to clear and expand your lungs and can prevent pneumonia after surgery.  - [place]. Do mild physical activity. Walking or moving your legs help your circulation and body functions return to normal. Do not try to get up or walk alone the first time after surgery.   -If you develop swelling on one leg or the other, pain in the back of your leg, redness/warmth in one of your legs, please call the office or go to the Emergency Room to have a doppler to rule out a blood clot. For shortness of breath, chest pain-seek care in the Emergency Room as soon as possible. - Actively manage your pain. Managing your pain lets you move in comfort. We will ask you to rate your pain on a scale of zero to 10. It is your responsibility to tell your doctor or nurse where and how much you hurt so your pain can be treated.  Special Considerations -If you are diabetic, you may be placed on insulin after surgery to have closer control over your blood sugars to promote healing and recovery.  This does not mean that you will be discharged on insulin.  If applicable, your oral antidiabetics will be resumed when you are tolerating a solid diet.  -Your final pathology results from surgery should be  available around one week after surgery and the results will be relayed to you when available.  -Dr. Lahoma Crocker is the surgeon that assists your GYN Oncologist with surgery.  If you end up staying the night, the next day after your surgery you will either see Dr. Denman George, Dr. Berline Lopes, or Dr. Lahoma Crocker.  -FMLA forms can be faxed to [phone] and please allow 5-7 business days for completion.  Pain Management After Surgery -You have been prescribed your pain medication and bowel regimen medications  before surgery so that you can have these available when you are discharged from the hospital. The pain medication is for use ONLY AFTER surgery and a new prescription will not be given.   -Make sure that you have Tylenol and Ibuprofen (use sparingly 200-400 mg every 8 hours as needed) at home to use on a regular basis after surgery for pain control.   -Review the attached handout on narcotic use and their risks and side effects.   Bowel Regimen -You have been prescribed Sennakot-S to take nightly to prevent constipation especially if you are taking the narcotic pain medication intermittently.  It is important to prevent constipation and drink adequate amounts of liquids. You can stop taking this medication when you are not taking pain medication and you are back on your normal bowel routine. YOU WILL NEED TO SEE WHERE YOUR BOWELS ARE AT THE TIME OF SURGERY (DIARRHEA OR CONSTIPATION), SINCE WE WOULD NOT WANT TO WORSEN CONSTIPATION.   Risks of Surgery Risks of surgery are low but include bleeding, infection, damage to surrounding structures, re-operation, blood clots, and very rarely death.   Blood Transfusion Information (For the consent to be signed before surgery)  We will be checking your blood type before surgery so in case of emergencies, we will know what type of blood you would need.                                            WHAT IS A BLOOD TRANSFUSION?  A transfusion is the replacement of blood or some of its parts. Blood is made up of multiple cells which provide different functions.  Red blood cells carry oxygen and are used for blood loss replacement.  White blood cells fight against infection.  Platelets control bleeding.  Plasma helps clot blood.  Other blood products are available for specialized needs, such as hemophilia or other clotting disorders. BEFORE THE TRANSFUSION  Who gives blood for transfusions?   You may be able to donate blood to be used at a later date  on yourself (autologous donation).  Relatives can be asked to donate blood. This is generally not any safer than if you have received blood from a stranger. The same precautions are taken to ensure safety when a relative's blood is donated.  Healthy volunteers who are fully evaluated to make sure their blood is safe. This is blood bank blood. Transfusion therapy is the safest it has ever been in the practice of medicine. Before blood is taken from a donor, a complete history is taken to make sure that person has no history of diseases nor engages in risky social behavior (examples are intravenous drug use or sexual activity with multiple partners). The donor's travel history is screened to minimize risk of transmitting infections, such as malaria. The donated blood is tested for signs of infectious diseases, such as  HIV and hepatitis. The blood is then tested to be sure it is compatible with you in order to minimize the chance of a transfusion reaction. If you or a relative donates blood, this is often done in anticipation of surgery and is not appropriate for emergency situations. It takes many days to process the donated blood. RISKS AND COMPLICATIONS Although transfusion therapy is very safe and saves many lives, the main dangers of transfusion include:   Getting an infectious disease.  Developing a transfusion reaction. This is an allergic reaction to something in the blood you were given. Every precaution is taken to prevent this. The decision to have a blood transfusion has been considered carefully by your caregiver before blood is given. Blood is not given unless the benefits outweigh the risks.  AFTER SURGERY INSTRUCTIONS  Return to work: 4 weeks if applicable  Activity: 1. Be up and out of the bed during the day.  Take a nap if needed.  You may walk up steps but be careful and use the hand rail.  Stair climbing will tire you more than you think, you may need to stop part way and rest.    2. No lifting or straining for 6 weeks over 10 pounds. No pushing, pulling, straining for 6 weeks.  3. No driving for 1 week(s) if you were cleared to drive before surgery.  Do not drive if you are taking narcotic pain medicine and make sure that your reaction time has returned.   4. You can shower as soon as the next day after surgery. Shower daily.  Use your regular soap and water (not directly on the incision) and pat your incision(s) dry afterwards; don't rub.  No tub baths or submerging your body in water until cleared by your surgeon. If you have the soap that was given to you by pre-surgical testing that was used before surgery, you do not need to use it afterwards because this can irritate your incisions.   5. No sexual activity and nothing in the vagina for 8 weeks.  6. You may experience a small amount of clear drainage from your incisions, which is normal.  If the drainage persists, increases, or changes color please call the office.  7. Do not use creams, lotions, or ointments such as neosporin on your incisions after surgery until advised by your surgeon because they can cause removal of the dermabond glue on your incisions.    8. You may experience vaginal spotting after surgery or around the 6-8 week mark from surgery when the stitches at the top of the vagina begin to dissolve.  The spotting is normal but if you experience heavy bleeding, call our office.  9. Take Tylenol or ibuprofen (use sparingly 200-400 MG IN 8 HOUR PERIOD AS NEEDED) first for pain and only use narcotic pain medication for severe pain not relieved by the Tylenol or Ibuprofen.  Monitor your Tylenol intake to a max of 4,000 mg in a 24 hour period. You can alternate these medications after surgery.  Diet: 1. Low sodium Heart Healthy Diet is recommended but you are cleared to resume your normal (before surgery) diet after your procedure.  2. It is safe to use a laxative, such as Miralax or Colace, if you have  difficulty moving your bowels. You have been prescribed Sennakot at bedtime every evening to keep bowel movements regular and to prevent constipation.  IF YOU ARE HAVING DIARRHEA, DO NOT TAKE.  Wound Care: 1. Keep clean and dry.  Shower daily.  Reasons to call the Doctor:  Fever - Oral temperature greater than 100.4 degrees Fahrenheit  Foul-smelling vaginal discharge  Difficulty urinating  Nausea and vomiting  Increased pain at the site of the incision that is unrelieved with pain medicine.  Difficulty breathing with or without chest pain  New calf pain especially if only on one side  Sudden, continuing increased vaginal bleeding with or without clots.   Contacts: For questions or concerns you should contact:  Dr. Everitt Amber at [phone]  Joylene John, NP at [phone]  After Hours: call [phone] and have the GYN Oncologist paged/contacted (after 5 pm or on the weekends)

## 2020-10-14 NOTE — Telephone Encounter (Signed)
Called and left the patient a message to call the office regarding an appt for Monday

## 2020-10-17 ENCOUNTER — Inpatient Hospital Stay: Payer: Medicare HMO | Attending: Gynecologic Oncology | Admitting: Gynecologic Oncology

## 2020-10-17 ENCOUNTER — Other Ambulatory Visit: Payer: Self-pay

## 2020-10-17 VITALS — BP 159/82 | HR 91 | Temp 98.1°F | Resp 18 | Ht 62.25 in | Wt 179.0 lb

## 2020-10-17 DIAGNOSIS — C541 Malignant neoplasm of endometrium: Secondary | ICD-10-CM

## 2020-10-17 MED ORDER — SENNOSIDES-DOCUSATE SODIUM 8.6-50 MG PO TABS: 2.0000 | ORAL_TABLET | Freq: Every day | ORAL | 0 refills | Status: DC

## 2020-10-17 MED ORDER — TRAMADOL HCL 50 MG PO TABS: 50.0000 mg | ORAL_TABLET | Freq: Four times a day (QID) | ORAL | 0 refills | Status: DC | PRN

## 2020-10-17 NOTE — Progress Notes (Signed)
DUE TO COVID-19 ONLY ONE VISITOR IS ALLOWED TO COME WITH YOU AND STAY IN THE WAITING ROOM ONLY DURING PRE OP AND PROCEDURE DAY OF SURGERY. THE 1 VISITOR  MAY VISIT WITH YOU AFTER SURGERY IN YOUR PRIVATE ROOM DURING VISITING HOURS ONLY!  YOU NEED TO HAVE A COVID 19 TEST ON_11/07/2020 ______ @_______ , THIS TEST MUST BE DONE BEFORE SURGERY,  COVID TESTING SITE [address] [place] [address], IT IS ON THE RIGHT GOING OUT [address] APPROXIMATELY  2 MINUTES PAST [employer] ON THE RIGHT. ONCE YOUR COVID TEST IS COMPLETED,  PLEASE BEGIN THE QUARANTINE INSTRUCTIONS AS OUTLINED IN YOUR HANDOUT.                [name]  10/17/2020   Your procedure is scheduled on: 10/27/2020    Report to [facility] Main  Entrance   Report to admitting at     0530am  Call this number if you have problems the morning of surgery [phone]    Remember: Do not eat food , candy gum or mints :After Midnight. You may have clear liquids from midnight until 0430am    CLEAR LIQUID DIET   Foods Allowed                                                                       Coffee and tea, regular and decaf                              Plain Jell-O any favor except red or purple                                            Fruit ices (not with fruit pulp)                                      Iced Popsicles                                     Carbonated beverages, regular and diet                                    Cranberry, grape and apple juices Sports drinks like Gatorade Lightly seasoned clear broth or consume(fat free) Sugar, honey syrup   _____________________________________________________________________    BRUSH YOUR TEETH MORNING OF SURGERY AND RINSE YOUR MOUTH OUT, NO CHEWING GUM CANDY OR MINTS.     Take these medicines the morning of surgery with A SIP OF WATER:  Synthroid, protonix  DO NOT TAKE ANY DIABETIC MEDICATIONS DAY OF YOUR SURGERY                                You may not have any metal on your body including hair  pins and              piercings  Do not wear jewelry, make-up, lotions, powders or perfumes, deodorant             Do not wear nail polish on your fingernails.  Do not shave  48 hours prior to surgery.              Men may shave face and neck.   Do not bring valuables to the hospital. [place].  Contacts, dentures or bridgework may not be worn into surgery.  Leave suitcase in the car. After surgery it may be brought to your room.     Patients discharged the day of surgery will not be allowed to drive home. IF YOU ARE HAVING SURGERY AND GOING HOME THE SAME DAY, YOU MUST HAVE AN ADULT TO DRIVE YOU HOME AND BE WITH YOU FOR 24 HOURS. YOU MAY GO HOME BY TAXI OR UBER OR ORTHERWISE, BUT AN ADULT MUST ACCOMPANY YOU HOME AND STAY WITH YOU FOR 24 HOURS.  Name and phone number of your driver:  Special Instructions: N/A              Please read over the following fact sheets you were given: _____________________________________________________________________  [facility] - Preparing for Surgery Before surgery, you can play an important role.  Because skin is not sterile, your skin needs to be as free of germs as possible.  You can reduce the number of germs on your skin by washing with CHG (chlorahexidine gluconate) soap before surgery.  CHG is an antiseptic cleaner which kills germs and bonds with the skin to continue killing germs even after washing. Please DO NOT use if you have an allergy to CHG or antibacterial soaps.  If your skin becomes reddened/irritated stop using the CHG and inform your nurse when you arrive at Short Stay. Do not shave (including legs and underarms) for at least 48 hours prior to the first CHG shower.  You may shave your face/neck. Please follow these instructions carefully:  1.  Shower with CHG Soap the night before surgery and the  morning of  Surgery.  2.  If you choose to wash your hair, wash your hair first as usual with your  normal  shampoo.  3.  After you shampoo, rinse your hair and body thoroughly to remove the  shampoo.                           4.  Use CHG as you would any other liquid soap.  You can apply chg directly  to the skin and wash                       Gently with a scrungie or clean washcloth.  5.  Apply the CHG Soap to your body ONLY FROM THE NECK DOWN.   Do not use on face/ open                           Wound or open sores. Avoid contact with eyes, ears mouth and genitals (private parts).                       Wash face,  Genitals (private parts) with your  normal soap.             6.  Wash thoroughly, paying special attention to the area where your surgery  will be performed.  7.  Thoroughly rinse your body with warm water from the neck down.  8.  DO NOT shower/wash with your normal soap after using and rinsing off  the CHG Soap.                9.  Pat yourself dry with a clean towel.            10.  Wear clean pajamas.            11.  Place clean sheets on your bed the night of your first shower and do not  sleep with pets. Day of Surgery : Do not apply any lotions/deodorants the morning of surgery.  Please wear clean clothes to the hospital/surgery center.  FAILURE TO FOLLOW THESE INSTRUCTIONS MAY RESULT IN THE CANCELLATION OF YOUR SURGERY PATIENT SIGNATURE_________________________________  NURSE SIGNATURE__________________________________  ________________________________________________________________________

## 2020-10-18 ENCOUNTER — Encounter (HOSPITAL_COMMUNITY)
Admission: RE | Admit: 2020-10-18 | Discharge: 2020-10-18 | Disposition: A | Discharge status: Home / Self Care | Payer: Medicare HMO | Source: Ambulatory Visit | Attending: Gynecologic Oncology | Admitting: Gynecologic Oncology

## 2020-10-18 ENCOUNTER — Other Ambulatory Visit: Payer: Self-pay

## 2020-10-18 ENCOUNTER — Encounter (HOSPITAL_COMMUNITY): Payer: Self-pay

## 2020-10-18 DIAGNOSIS — C541 Malignant neoplasm of endometrium: Secondary | ICD-10-CM | POA: Insufficient documentation

## 2020-10-18 DIAGNOSIS — Z01812 Encounter for preprocedural laboratory examination: Secondary | ICD-10-CM | POA: Insufficient documentation

## 2020-10-18 HISTORY — DX: Cardiac murmur, unspecified: R01.1

## 2020-10-18 HISTORY — DX: Family history of other specified conditions: Z84.89

## 2020-10-18 HISTORY — DX: Dyspnea, unspecified: R06.00

## 2020-10-18 HISTORY — DX: Unspecified osteoarthritis, unspecified site: M19.90

## 2020-10-18 LAB — CBC
HCT: 38.7 % (ref 36.0–46.0)
Hemoglobin: 12.9 g/dL (ref 12.0–15.0)
MCH: 30.5 pg (ref 26.0–34.0)
MCHC: 33.3 g/dL (ref 30.0–36.0)
MCV: 91.5 fL (ref 80.0–100.0)
Platelets: 222 10*3/uL (ref 150–400)
RBC: 4.23 MIL/uL (ref 3.87–5.11)
RDW: 13.6 % (ref 11.5–15.5)
WBC: 5.5 10*3/uL (ref 4.0–10.5)
nRBC: 0 % (ref 0.0–0.2)

## 2020-10-18 LAB — URINALYSIS, ROUTINE W REFLEX MICROSCOPIC
Bacteria, UA: NONE SEEN
Bilirubin Urine: NEGATIVE
Glucose, UA: 50 mg/dL — AB
Ketones, ur: NEGATIVE mg/dL
Nitrite: NEGATIVE
Protein, ur: NEGATIVE mg/dL
Specific Gravity, Urine: 1.011 (ref 1.005–1.030)
pH: 6 (ref 5.0–8.0)

## 2020-10-18 LAB — COMPREHENSIVE METABOLIC PANEL
ALT: 12 U/L (ref 0–44)
AST: 17 U/L (ref 15–41)
Albumin: 4.1 g/dL (ref 3.5–5.0)
Alkaline Phosphatase: 58 U/L (ref 38–126)
Anion gap: 10 (ref 5–15)
BUN: 16 mg/dL (ref 8–23)
CO2: 28 mmol/L (ref 22–32)
Calcium: 9.3 mg/dL (ref 8.9–10.3)
Chloride: 99 mmol/L (ref 98–111)
Creatinine, Ser: 0.94 mg/dL (ref 0.44–1.00)
GFR, Estimated: >60 mL/min (ref >60)
Glucose, Bld: 241 mg/dL — ABNORMAL HIGH (ref 70–99)
Potassium: 3.9 mmol/L (ref 3.5–5.1)
Sodium: 137 mmol/L (ref 135–145)
Total Bilirubin: 0.7 mg/dL (ref 0.3–1.2)
Total Protein: 7.5 g/dL (ref 6.5–8.1)

## 2020-10-18 LAB — GLUCOSE, CAPILLARY: Glucose-Capillary: 243 mg/dL — ABNORMAL HIGH (ref 70–99)

## 2020-10-18 NOTE — Progress Notes (Addendum)
Anesthesia Review:  PCP:DR kevin little - LOV 6 monsths ago per pt  Cardiologist : Irish Lack 10/03/20- clearance in epic  Chest x-ray :10/03/2020  CT cors- 05/03/2020  EKG : 10/03/2020- epic  Echo : 2020  Stress test: Cardiac Cath :  Activity level:  Cannot do a flight of stiars without difficulty  Pt reports chest pain 2-3 weeks ago  Sleep Study/ CPAP : no cpap  Fasting Blood Sugar :      / Checks Blood Sugar -- times a day:   Blood Thinner/ Instructions /Last Dose: Diabetic- on no meds due to allergies  Glucose at preop was 243  ASA / Instructions/ Last Dose :  SEe anesthesia complications - Konrad Felix, PAC made aware LOV- Neuro- 12/2019- epic  A1c- 7.2 on 09/29/2020

## 2020-10-19 LAB — URINE CULTURE: Culture: <10000 — AB

## 2020-10-19 NOTE — Progress Notes (Signed)
Anesthesia Chart Review   Case: [id number] Date/Time: 10/27/20 0715   Procedures:      XI ROBOTIC ASSISTED TOTAL HYSTERECTOMY WITH BILATERAL SALPINGO OOPHORECTOMY (N/A )     SENTINEL NODE BIOPSY (N/A )   Anesthesia type: General   Pre-op diagnosis: ENDOMETRIAL CANCER   Location: [place] / [facility]   Surgeons: Everitt Amber, MD      DISCUSSION:69 y.o. never smoker with h/o PONV, GERD, HTN, asthma, sleep apnea, DM II, endometrial cancer scheduled for above procedure 10/27/20 with Dr. Everitt Amber.   Per Dr. Serita Grit note 09/29/2020, "She has significant medical history for subarachnoid hemorrhage with intracranial calcifications may contraindicate Trendelenburg positioning and potentially even general anesthesia.  I will consult with her neurologist and cardiologist regarding optimization for surgery and if she is a candidate for Trendelenburg positioning for a 1 hour surgical procedure."  In follow up note 10/11/2020, "Her neurologist did not feel comfortable weighing in regarding her intracranial calcifications.  However she has no aneurysms on imaging that was performed in March 2021, and a remote history of subarachnoid hemorrhage is not a contraindication to mild Trendelenburg positioning for hysterectomy."  Per cardiology preoperative risk assessment 10/03/2020, "Chart reviewed as part of pre-operative protocol coverage.  [name] was last seen on 10/03/20 by Dr. Irish Lack and was cleared for surgery.  Therefore, based on ACC/AHA guidelines, the patient would be at acceptable risk for the planned procedure without further cardiovascular testing."  Anticipate pt can proceed with planned procedure barring acute status change.   VS: BP (!) 157/72   Pulse 94   Temp 36.6 C (Oral)   Resp 18   Ht 5' 2.5" (1.588 m)   Wt 82.6 kg   SpO2 98%   BMI 32.76 kg/m   PROVIDERS: Hulan Fess, MD is PCP   Larae Grooms, MD is Cardiologist  LABS: Labs reviewed: Acceptable for  surgery. (all labs ordered are listed, but only abnormal results are displayed)  Labs Reviewed  COMPREHENSIVE METABOLIC PANEL - Abnormal; Notable for the following components:      Result Value   Glucose, Bld 241 (*)    All other components within normal limits  URINALYSIS, ROUTINE W REFLEX MICROSCOPIC - Abnormal; Notable for the following components:   Glucose, UA 50 (*)    Hgb urine dipstick SMALL (*)    Leukocytes,Ua MODERATE (*)    All other components within normal limits  GLUCOSE, CAPILLARY - Abnormal; Notable for the following components:   Glucose-Capillary 243 (*)    All other components within normal limits  CBC  TYPE AND SCREEN     IMAGES:   EKG: 10/03/2020 Rate 100 bpm NSR  CV: Echo 11/27/2019 IMPRESSIONS    1. Left ventricular ejection fraction, by visual estimation, is 60 to  65%. The left ventricle has normal function. There is no left ventricular  hypertrophy.  2. Left ventricular diastolic parameters are indeterminate.  3. The left ventricle has no regional wall motion abnormalities.  4. Global right ventricle has normal systolic function.The right  ventricular size is normal. No increase in right ventricular wall  thickness.  5. Left atrial size was mildly dilated.  6. Right atrial size was normal.  7. The pericardial effusion is lateral to the left ventricle.  8. Trivial pericardial effusion is present.  9. Moderate calcification of the mitral valve leaflet(s).  10. Moderate thickening of the mitral valve leaflet(s).  11. Severe mitral annular calcification.  12. The mitral valve is degenerative. Trivial  mitral valve regurgitation.  No evidence of mitral stenosis.  13. The tricuspid valve is normal in structure. Tricuspid valve  regurgitation is mild.  14. The aortic valve is tricuspid. Aortic valve regurgitation is not  visualized. Mild to moderate aortic valve sclerosis/calcification without  any evidence of aortic stenosis.  15.  The pulmonic valve was grossly normal. Pulmonic valve regurgitation is  trivial.  16. Moderately elevated pulmonary artery systolic pressure.  17. The inferior vena cava is normal in size with greater than 50%  respiratory variability, suggesting right atrial pressure of 3 mmHg.  Past Medical History:  Diagnosis Date  . Allergy   . Anemia   . Arthritis   . Asthma    Dianosed years ago-no meds at this time  . Cancer [facility])    endometrial   . Cataract    Right eye removed-still has left cataract   . Chronic kidney disease 1997   Nepthrotic syndrom with minimal change-in remission per pt - last seen by kidney MD- 2 mos ago ? name of MD   . Diabetes mellitus    type 2   . Dyspnea    with exertion   . Dysrhythmia    "skips beats"  . Family history of adverse reaction to anesthesia    son stopped breathing during surgery , mother slow to wake up   . Fibromyalgia 1980's  . Generalized anxiety disorder   . GERD (gastroesophageal reflux disease)    occ- will use Tums and Prolosec  prn   . Heart murmur   . Hyperlipemia   . Hypertension    not on blood pressure meds due to allergies - last dose of meds- months ago   . Hypothyroidism   . Irritable bowel syndrome 1980's  . Major depressive disorder   . Migraine headaches 1980's   On meds-well controlled  . 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  . Morbid obesity ([place])   . Pneumonia 2009   Several times over the past several years  . PONV (postoperative nausea and vomiting)    was told by neurologist due to calcificaton in brain not to be put to sleep, N/V during Op and Recovery   . Recurrent upper respiratory infection (URI)   . Sleep apnea    no cpap  . Subarachnoid hemorrhage ([place]) 2010  . Vaginal polyp    benign per pt    Past Surgical History:  Procedure Laterality Date  . CHOLECYSTECTOMY  1983  . COLONOSCOPY  2008   DB  . [place] OF UTERUS  1972  . HYSTEROSCOPY WITH D & C  06/29/2011   Procedure:  DILATATION AND CURETTAGE (D&C) /HYSTEROSCOPY;  Surgeon: Donnamae Jude, MD;  Location: [facility];  Service: Gynecology;  Laterality: N/A;    MEDICATIONS: . acetaminophen (TYLENOL) 500 MG tablet  . amitriptyline (ELAVIL) 10 MG tablet  . aspirin EC 81 MG tablet  . bumetanide (BUMEX) 1 MG tablet  . cyanocobalamin (,VITAMIN B-12,) 1000 MCG/ML injection  . Evolocumab (REPATHA SURECLICK) 601 MG/ML SOAJ  . levothyroxine (SYNTHROID, LEVOTHROID) 50 MCG tablet  . pantoprazole (PROTONIX) 40 MG tablet  . Potassium (POTASSIMIN PO)  . Potassium 99 MG TABS  . senna-docusate (SENOKOT-S) 8.6-50 MG tablet  . traMADol (ULTRAM) 50 MG tablet   No current facility-administered medications for this encounter.     Konrad Felix, PA-C [facility] Pre-Surgical Testing [phone]

## 2020-10-20 DIAGNOSIS — F329 Major depressive disorder, single episode, unspecified: Secondary | ICD-10-CM | POA: Diagnosis not present

## 2020-10-21 ENCOUNTER — Telehealth: Payer: Self-pay | Admitting: Oncology

## 2020-10-21 NOTE — Telephone Encounter (Addendum)
Called [name] and advised her that Dr. Denman George spoke with Dr. Charmian Muff and he does not feel like any additional cardiac testing before surgery.  She verbalized understanding.  Also discussed that we will get her back in to see her gastroenterologist regarding her GI Symptoms.  She agreed and said she sees [name] at [place].  Discussed that I will call to get her an appointment and call her back.

## 2020-10-21 NOTE — Telephone Encounter (Signed)
Left a message with appointment for [facility] on 11/14/20 at 11:00.  Requested a return call to confirm.

## 2020-10-24 ENCOUNTER — Other Ambulatory Visit (HOSPITAL_COMMUNITY)
Admission: RE | Admit: 2020-10-24 | Discharge: 2020-10-24 | Disposition: A | Discharge status: Home / Self Care | Payer: Medicare HMO | Source: Ambulatory Visit | Attending: Emergency Medicine | Admitting: Emergency Medicine

## 2020-10-24 DIAGNOSIS — Z01812 Encounter for preprocedural laboratory examination: Secondary | ICD-10-CM | POA: Diagnosis not present

## 2020-10-24 DIAGNOSIS — Z20822 Contact with and (suspected) exposure to covid-19: Secondary | ICD-10-CM | POA: Diagnosis not present

## 2020-10-24 LAB — SARS CORONAVIRUS 2 (TAT 6-24 HRS): SARS Coronavirus 2: NEGATIVE

## 2020-10-26 ENCOUNTER — Telehealth: Payer: Self-pay

## 2020-10-26 NOTE — Telephone Encounter (Signed)
LM  For Ms Harcum to call back to the office to review pre-op instructions for her surgery tomorrow.

## 2020-10-26 NOTE — Anesthesia Preprocedure Evaluation (Addendum)
Anesthesia Evaluation  Patient identified by MRN, date of birth, ID band Patient awake    Reviewed: Allergy & Precautions, NPO status , Patient's Chart, lab work & pertinent test results  History of Anesthesia Complications (+) PONV and history of anesthetic complications  Airway Mallampati: II  TM Distance: >3 FB Neck ROM: Full    Dental no notable dental hx. (+) Dental Advisory Given   Pulmonary asthma , sleep apnea ,    Pulmonary exam normal        Cardiovascular hypertension, Normal cardiovascular exam  IMPRESSIONS    1. Left ventricular ejection fraction, by visual estimation, is 60 to  65%. The left ventricle has normal function. There is no left ventricular  hypertrophy.  2. Left ventricular diastolic parameters are indeterminate.  3. The left ventricle has no regional wall motion abnormalities.  4. Global right ventricle has normal systolic function.The right  ventricular size is normal. No increase in right ventricular wall  thickness.  5. Left atrial size was mildly dilated.  6. Right atrial size was normal.  7. The pericardial effusion is lateral to the left ventricle.  8. Trivial pericardial effusion is present.  9. Moderate calcification of the mitral valve leaflet(s).  10. Moderate thickening of the mitral valve leaflet(s).  11. Severe mitral annular calcification.  12. The mitral valve is degenerative. Trivial mitral valve regurgitation.  No evidence of mitral stenosis.  13. The tricuspid valve is normal in structure. Tricuspid valve  regurgitation is mild.  14. The aortic valve is tricuspid. Aortic valve regurgitation is not  visualized. Mild to moderate aortic valve sclerosis/calcification without  any evidence of aortic stenosis.  15. The pulmonic valve was grossly normal. Pulmonic valve regurgitation is  trivial.  16. Moderately elevated pulmonary artery systolic pressure.  17. The inferior  vena cava is normal in size with greater than 50%  respiratory variability, suggesting right atrial pressure of 3 mmHg.    Neuro/Psych  Headaches, PSYCHIATRIC DISORDERS Anxiety Depression    GI/Hepatic Neg liver ROS, GERD  ,  Endo/Other  diabetesHypothyroidism   Renal/GU negative Renal ROS     Musculoskeletal negative musculoskeletal ROS (+)   Abdominal   Peds  Hematology negative hematology ROS (+)   Anesthesia Other Findings   Reproductive/Obstetrics                            Anesthesia Physical Anesthesia Plan  ASA: III  Anesthesia Plan: General   Post-op Pain Management:    Induction: Intravenous  PONV Risk Score and Plan: 4 or greater and Ondansetron, Dexamethasone, Diphenhydramine and TIVA  Airway Management Planned: Oral ETT  Additional Equipment:   Intra-op Plan:   Post-operative Plan: Extubation in OR  Informed Consent: I have reviewed the patients History and Physical, chart, labs and discussed the procedure including the risks, benefits and alternatives for the proposed anesthesia with the patient or authorized representative who has indicated his/her understanding and acceptance.     Dental advisory given  Plan Discussed with: Anesthesiologist and CRNA  Anesthesia Plan Comments: (Per cardiology preoperative risk assessment 10/03/2020, "Chart reviewed as part of pre-operative protocol coverage.Brettany Harrel Lemon Shirleywas last seen on 10/18/21by Dr. Irish Lack and was cleared for surgery. Therefore, based on ACC/AHA guidelines, the patient would be at acceptable risk for the planned procedure without further cardiovascular testing.")       Anesthesia Quick Evaluation

## 2020-10-26 NOTE — Telephone Encounter (Signed)
Ms Bortle states that she understands her written pre op instructions.  She has held her ASA Since 10-17-20 as directed. She has been taking in liquids today only. Told her that she can have a regular light diet.  Pt rather stay with liquids as she has a severe problem with diarrhea.  Pt has not taken her protonix the last several days and may not take it tomorrow am.

## 2020-10-27 ENCOUNTER — Other Ambulatory Visit: Payer: Self-pay

## 2020-10-27 ENCOUNTER — Ambulatory Visit (HOSPITAL_COMMUNITY): Payer: Medicare HMO | Admitting: Physician Assistant

## 2020-10-27 ENCOUNTER — Encounter (HOSPITAL_COMMUNITY): Payer: Self-pay | Admitting: Gynecologic Oncology

## 2020-10-27 ENCOUNTER — Ambulatory Visit (HOSPITAL_COMMUNITY)
Admission: RE | Admit: 2020-10-27 | Discharge: 2020-10-27 | Disposition: A | Discharge status: Home / Self Care | Payer: Medicare HMO | Attending: Gynecologic Oncology | Admitting: Gynecologic Oncology

## 2020-10-27 ENCOUNTER — Encounter (HOSPITAL_COMMUNITY)
Admission: RE | Disposition: A | Discharge status: Home / Self Care | Payer: Self-pay | Source: Home / Self Care | Attending: Gynecologic Oncology

## 2020-10-27 ENCOUNTER — Ambulatory Visit (HOSPITAL_COMMUNITY): Payer: Medicare HMO | Admitting: Anesthesiology

## 2020-10-27 DIAGNOSIS — N189 Chronic kidney disease, unspecified: Secondary | ICD-10-CM | POA: Insufficient documentation

## 2020-10-27 DIAGNOSIS — Z87820 Personal history of traumatic brain injury: Secondary | ICD-10-CM | POA: Insufficient documentation

## 2020-10-27 DIAGNOSIS — I129 Hypertensive chronic kidney disease with stage 1 through stage 4 chronic kidney disease, or unspecified chronic kidney disease: Secondary | ICD-10-CM | POA: Diagnosis not present

## 2020-10-27 DIAGNOSIS — E1122 Type 2 diabetes mellitus with diabetic chronic kidney disease: Secondary | ICD-10-CM | POA: Diagnosis not present

## 2020-10-27 DIAGNOSIS — Z79890 Hormone replacement therapy (postmenopausal): Secondary | ICD-10-CM | POA: Insufficient documentation

## 2020-10-27 DIAGNOSIS — D631 Anemia in chronic kidney disease: Secondary | ICD-10-CM | POA: Diagnosis not present

## 2020-10-27 DIAGNOSIS — E039 Hypothyroidism, unspecified: Secondary | ICD-10-CM | POA: Insufficient documentation

## 2020-10-27 DIAGNOSIS — I2510 Atherosclerotic heart disease of native coronary artery without angina pectoris: Secondary | ICD-10-CM | POA: Diagnosis not present

## 2020-10-27 DIAGNOSIS — E1165 Type 2 diabetes mellitus with hyperglycemia: Secondary | ICD-10-CM | POA: Insufficient documentation

## 2020-10-27 DIAGNOSIS — Z7982 Long term (current) use of aspirin: Secondary | ICD-10-CM | POA: Diagnosis not present

## 2020-10-27 DIAGNOSIS — Z79899 Other long term (current) drug therapy: Secondary | ICD-10-CM | POA: Diagnosis not present

## 2020-10-27 DIAGNOSIS — C541 Malignant neoplasm of endometrium: Secondary | ICD-10-CM | POA: Insufficient documentation

## 2020-10-27 DIAGNOSIS — K219 Gastro-esophageal reflux disease without esophagitis: Secondary | ICD-10-CM | POA: Insufficient documentation

## 2020-10-27 HISTORY — PX: SENTINEL NODE BIOPSY: SHX6608

## 2020-10-27 HISTORY — PX: ROBOTIC ASSISTED TOTAL HYSTERECTOMY WITH BILATERAL SALPINGO OOPHERECTOMY: SHX6086

## 2020-10-27 LAB — GLUCOSE, CAPILLARY
Glucose-Capillary: 227 mg/dL — ABNORMAL HIGH (ref 70–99)
Glucose-Capillary: 177 mg/dL — ABNORMAL HIGH (ref 70–99)
Glucose-Capillary: 157 mg/dL — ABNORMAL HIGH (ref 70–99)
Glucose-Capillary: 253 mg/dL — ABNORMAL HIGH (ref 70–99)

## 2020-10-27 LAB — ABO/RH: ABO/RH(D): A POS

## 2020-10-27 LAB — TYPE AND SCREEN
ABO/RH(D): A POS
Antibody Screen: NEGATIVE

## 2020-10-27 SURGERY — HYSTERECTOMY, TOTAL, ROBOT-ASSISTED, LAPAROSCOPIC, WITH BILATERAL SALPINGO-OOPHORECTOMY
Anesthesia: General

## 2020-10-27 MED ORDER — SODIUM CHLORIDE 0.9% FLUSH: 3.0000 mL | INTRAVENOUS | Status: DC | PRN

## 2020-10-27 MED ORDER — LACTATED RINGERS IR SOLN
Status: DC | PRN
  Administered 2020-10-27: 1000 mL

## 2020-10-27 MED ORDER — LIDOCAINE HCL 2 % IJ SOLN
INTRAMUSCULAR | Status: AC
  Filled 2020-10-27: qty 20

## 2020-10-27 MED ORDER — STERILE WATER FOR INJECTION IJ SOLN
INTRAMUSCULAR | Status: AC
  Filled 2020-10-27: qty 10

## 2020-10-27 MED ORDER — ACETAMINOPHEN 500 MG PO TABS: 1000.0000 mg | ORAL_TABLET | ORAL | Status: DC

## 2020-10-27 MED ORDER — ROCURONIUM BROMIDE 10 MG/ML (PF) SYRINGE
PREFILLED_SYRINGE | INTRAVENOUS | Status: AC
  Filled 2020-10-27: qty 10

## 2020-10-27 MED ORDER — ACETAMINOPHEN 325 MG PO TABS: 650.0000 mg | ORAL_TABLET | ORAL | Status: DC | PRN

## 2020-10-27 MED ORDER — STERILE WATER FOR IRRIGATION IR SOLN
Status: DC | PRN
  Administered 2020-10-27: 1000 mL

## 2020-10-27 MED ORDER — SUGAMMADEX SODIUM 200 MG/2ML IV SOLN
INTRAVENOUS | Status: DC | PRN
  Administered 2020-10-27: 200 mg via INTRAVENOUS

## 2020-10-27 MED ORDER — ONDANSETRON HCL 4 MG/2ML IJ SOLN
INTRAMUSCULAR | Status: DC | PRN
  Administered 2020-10-27: 4 mg via INTRAVENOUS

## 2020-10-27 MED ORDER — STERILE WATER FOR INJECTION IJ SOLN
INTRAMUSCULAR | Status: DC | PRN
  Administered 2020-10-27: 10 mL

## 2020-10-27 MED ORDER — ACETAMINOPHEN 650 MG RE SUPP
650.0000 mg | RECTAL | Status: DC | PRN
  Filled 2020-10-27: qty 1

## 2020-10-27 MED ORDER — PROMETHAZINE HCL 25 MG/ML IJ SOLN: 6.2500 mg | INTRAMUSCULAR | Status: DC | PRN

## 2020-10-27 MED ORDER — MORPHINE SULFATE (PF) 4 MG/ML IV SOLN: 2.0000 mg | INTRAVENOUS | Status: DC | PRN

## 2020-10-27 MED ORDER — LIDOCAINE 2% (20 MG/ML) 5 ML SYRINGE
INTRAMUSCULAR | Status: AC
  Filled 2020-10-27: qty 5

## 2020-10-27 MED ORDER — PHENYLEPHRINE HCL-NACL 10-0.9 MG/250ML-% IV SOLN
INTRAVENOUS | Status: AC
  Filled 2020-10-27: qty 250

## 2020-10-27 MED ORDER — CEFAZOLIN SODIUM-DEXTROSE 2-4 GM/100ML-% IV SOLN
2.0000 g | INTRAVENOUS | Status: AC
  Administered 2020-10-27: 2 g via INTRAVENOUS
  Filled 2020-10-27: qty 100

## 2020-10-27 MED ORDER — DIPHENHYDRAMINE HCL 50 MG/ML IJ SOLN
INTRAMUSCULAR | Status: DC | PRN
  Administered 2020-10-27: 6.25 mg via INTRAVENOUS

## 2020-10-27 MED ORDER — PROPOFOL 10 MG/ML IV BOLUS
INTRAVENOUS | Status: DC | PRN
  Administered 2020-10-27: 150 mg via INTRAVENOUS

## 2020-10-27 MED ORDER — FENTANYL CITRATE (PF) 100 MCG/2ML IJ SOLN: 25.0000 ug | INTRAMUSCULAR | Status: DC | PRN

## 2020-10-27 MED ORDER — CHLORHEXIDINE GLUCONATE 0.12 % MT SOLN
15.0000 mL | Freq: Once | OROMUCOSAL | Status: AC
  Administered 2020-10-27: 15 mL via OROMUCOSAL

## 2020-10-27 MED ORDER — FENTANYL CITRATE (PF) 100 MCG/2ML IJ SOLN
INTRAMUSCULAR | Status: AC
  Filled 2020-10-27: qty 2

## 2020-10-27 MED ORDER — PROPOFOL 1000 MG/100ML IV EMUL
INTRAVENOUS | Status: AC
  Filled 2020-10-27: qty 100

## 2020-10-27 MED ORDER — SODIUM CHLORIDE 0.9% FLUSH: 3.0000 mL | Freq: Two times a day (BID) | INTRAVENOUS | Status: DC

## 2020-10-27 MED ORDER — LIDOCAINE 20MG/ML (2%) 15 ML SYRINGE OPTIME
INTRAMUSCULAR | Status: DC | PRN
  Administered 2020-10-27: 1.5 mg/kg/h via INTRAVENOUS

## 2020-10-27 MED ORDER — SODIUM CHLORIDE 0.9 % IV SOLN: 250.0000 mL | INTRAVENOUS | Status: DC | PRN

## 2020-10-27 MED ORDER — BUPIVACAINE HCL 0.25 % IJ SOLN
INTRAMUSCULAR | Status: AC
  Filled 2020-10-27: qty 1

## 2020-10-27 MED ORDER — ROCURONIUM BROMIDE 10 MG/ML (PF) SYRINGE
PREFILLED_SYRINGE | INTRAVENOUS | Status: DC | PRN
  Administered 2020-10-27: 80 mg via INTRAVENOUS

## 2020-10-27 MED ORDER — OXYCODONE HCL 5 MG PO TABS: 5.0000 mg | ORAL_TABLET | ORAL | Status: DC | PRN

## 2020-10-27 MED ORDER — DEXAMETHASONE SODIUM PHOSPHATE 10 MG/ML IJ SOLN
INTRAMUSCULAR | Status: AC
  Filled 2020-10-27: qty 1

## 2020-10-27 MED ORDER — DEXTROSE 50 % IV SOLN: 0.0000 mL | INTRAVENOUS | Status: DC | PRN

## 2020-10-27 MED ORDER — PHENYLEPHRINE HCL-NACL 10-0.9 MG/250ML-% IV SOLN
INTRAVENOUS | Status: DC | PRN
  Administered 2020-10-27 (×2): 20 ug/min via INTRAVENOUS

## 2020-10-27 MED ORDER — ALBUMIN HUMAN 5 % IV SOLN
INTRAVENOUS | Status: AC
  Filled 2020-10-27: qty 250

## 2020-10-27 MED ORDER — FENTANYL CITRATE (PF) 250 MCG/5ML IJ SOLN
INTRAMUSCULAR | Status: DC | PRN
  Administered 2020-10-27 (×2): 50 ug via INTRAVENOUS

## 2020-10-27 MED ORDER — ENOXAPARIN SODIUM 40 MG/0.4ML ~~LOC~~ SOLN
40.0000 mg | SUBCUTANEOUS | Status: AC
  Administered 2020-10-27: 40 mg via SUBCUTANEOUS
  Filled 2020-10-27: qty 0.4

## 2020-10-27 MED ORDER — PROPOFOL 500 MG/50ML IV EMUL
INTRAVENOUS | Status: DC | PRN
  Administered 2020-10-27: 150 ug/kg/min via INTRAVENOUS

## 2020-10-27 MED ORDER — LIDOCAINE 2% (20 MG/ML) 5 ML SYRINGE
INTRAMUSCULAR | Status: DC | PRN
  Administered 2020-10-27: 40 mg via INTRAVENOUS

## 2020-10-27 MED ORDER — PROPOFOL 10 MG/ML IV BOLUS
INTRAVENOUS | Status: AC
  Filled 2020-10-27: qty 40

## 2020-10-27 MED ORDER — LACTATED RINGERS IV SOLN: INTRAVENOUS | Status: DC

## 2020-10-27 MED ORDER — DEXAMETHASONE SODIUM PHOSPHATE 10 MG/ML IJ SOLN
INTRAMUSCULAR | Status: DC | PRN
  Administered 2020-10-27: 4 mg via INTRAVENOUS

## 2020-10-27 MED ORDER — BUPIVACAINE HCL 0.25 % IJ SOLN
INTRAMUSCULAR | Status: DC | PRN
  Administered 2020-10-27: 20 mL

## 2020-10-27 MED ORDER — DIPHENHYDRAMINE HCL 50 MG/ML IJ SOLN
INTRAMUSCULAR | Status: AC
  Filled 2020-10-27: qty 1

## 2020-10-27 MED ORDER — ALBUMIN HUMAN 5 % IV SOLN: INTRAVENOUS | Status: DC | PRN

## 2020-10-27 MED ORDER — SODIUM CHLORIDE (PF) 0.9 % IJ SOLN
INTRAMUSCULAR | Status: AC
  Filled 2020-10-27: qty 10

## 2020-10-27 MED ORDER — ACETAMINOPHEN 500 MG PO TABS
1000.0000 mg | ORAL_TABLET | Freq: Once | ORAL | Status: AC
  Administered 2020-10-27: 1000 mg via ORAL
  Filled 2020-10-27: qty 2

## 2020-10-27 MED ORDER — INSULIN REGULAR(HUMAN) IN NACL 100-0.9 UT/100ML-% IV SOLN
INTRAVENOUS | Status: DC
  Administered 2020-10-27: 10 [IU]/h via INTRAVENOUS
  Filled 2020-10-27: qty 100

## 2020-10-27 MED ORDER — ONDANSETRON HCL 4 MG/2ML IJ SOLN
INTRAMUSCULAR | Status: AC
  Filled 2020-10-27: qty 2

## 2020-10-27 SURGICAL SUPPLY — 71 items
ADH SKN CLS APL DERMABOND .7 (GAUZE/BANDAGES/DRESSINGS) ×1
AGENT HMST KT MTR STRL THRMB (HEMOSTASIS)
APL ESCP 34 STRL LF DISP (HEMOSTASIS)
APPLICATOR SURGIFLO ENDO (HEMOSTASIS) IMPLANT
BACTOSHIELD CHG 4% 4OZ (MISCELLANEOUS) ×1
BAG LAPAROSCOPIC 12 15 PORT 16 (BASKET) IMPLANT
BAG RETRIEVAL 12/15 (BASKET)
BAG SPEC RTRVL LRG 6X4 10 (ENDOMECHANICALS)
BLADE SURG SZ10 CARB STEEL (BLADE) IMPLANT
COVER BACK TABLE 60X90IN (DRAPES) ×2 IMPLANT
COVER TIP SHEARS 8 DVNC (MISCELLANEOUS) ×1 IMPLANT
COVER TIP SHEARS 8MM DA VINCI (MISCELLANEOUS) ×2
COVER WAND RF STERILE (DRAPES) IMPLANT
DECANTER SPIKE VIAL GLASS SM (MISCELLANEOUS) ×2 IMPLANT
DERMABOND ADVANCED (GAUZE/BANDAGES/DRESSINGS) ×1
DERMABOND ADVANCED .7 DNX12 (GAUZE/BANDAGES/DRESSINGS) ×1 IMPLANT
DRAPE ARM DVNC X/XI (DISPOSABLE) ×4 IMPLANT
DRAPE COLUMN DVNC XI (DISPOSABLE) ×1 IMPLANT
DRAPE DA VINCI XI ARM (DISPOSABLE) ×8
DRAPE DA VINCI XI COLUMN (DISPOSABLE) ×2
DRAPE SHEET LG 3/4 BI-LAMINATE (DRAPES) ×2 IMPLANT
DRAPE SURG IRRIG POUCH 19X23 (DRAPES) ×2 IMPLANT
DRSG OPSITE POSTOP 4X6 (GAUZE/BANDAGES/DRESSINGS) IMPLANT
DRSG OPSITE POSTOP 4X8 (GAUZE/BANDAGES/DRESSINGS) IMPLANT
ELECT PENCIL ROCKER SW 15FT (MISCELLANEOUS) IMPLANT
ELECT REM PT RETURN 15FT ADLT (MISCELLANEOUS) ×2 IMPLANT
GLOVE BIO SURGEON STRL SZ 6 (GLOVE) ×8 IMPLANT
GLOVE BIO SURGEON STRL SZ 6.5 (GLOVE) ×4 IMPLANT
GOWN STRL REUS W/ TWL LRG LVL3 (GOWN DISPOSABLE) ×4 IMPLANT
GOWN STRL REUS W/TWL LRG LVL3 (GOWN DISPOSABLE) ×8
HOLDER FOLEY CATH W/STRAP (MISCELLANEOUS) ×1 IMPLANT
IRRIG SUCT STRYKERFLOW 2 WTIP (MISCELLANEOUS) ×2
IRRIGATION SUCT STRKRFLW 2 WTP (MISCELLANEOUS) ×1 IMPLANT
KIT PROCEDURE DA VINCI SI (MISCELLANEOUS) ×2
KIT PROCEDURE DVNC SI (MISCELLANEOUS) IMPLANT
KIT TURNOVER KIT A (KITS) IMPLANT
MANIPULATOR UTERINE 4.5 ZUMI (MISCELLANEOUS) ×2 IMPLANT
NDL SPNL 18GX3.5 QUINCKE PK (NEEDLE) IMPLANT
NEEDLE HYPO 22GX1.5 SAFETY (NEEDLE) ×2 IMPLANT
NEEDLE SPNL 18GX3.5 QUINCKE PK (NEEDLE) ×2 IMPLANT
OBTURATOR OPTICAL STANDARD 8MM (TROCAR) ×2
OBTURATOR OPTICAL STND 8 DVNC (TROCAR) ×1
OBTURATOR OPTICALSTD 8 DVNC (TROCAR) ×1 IMPLANT
PACK ROBOT GYN CUSTOM WL (TRAY / TRAY PROCEDURE) ×2 IMPLANT
PAD POSITIONING PINK XL (MISCELLANEOUS) ×2 IMPLANT
PORT ACCESS TROCAR AIRSEAL 12 (TROCAR) ×1 IMPLANT
PORT ACCESS TROCAR AIRSEAL 5M (TROCAR) ×1
POUCH SPECIMEN RETRIEVAL 10MM (ENDOMECHANICALS) IMPLANT
SCRUB CHG 4% DYNA-HEX 4OZ (MISCELLANEOUS) ×1 IMPLANT
SEAL CANN UNIV 5-8 DVNC XI (MISCELLANEOUS) ×3 IMPLANT
SEAL XI 5MM-8MM UNIVERSAL (MISCELLANEOUS) ×8
SET TRI-LUMEN FLTR TB AIRSEAL (TUBING) ×2 IMPLANT
SPONGE LAP 18X18 RF (DISPOSABLE) IMPLANT
SURGIFLO W/THROMBIN 8M KIT (HEMOSTASIS) IMPLANT
SUT MNCRL AB 4-0 PS2 18 (SUTURE) IMPLANT
SUT PDS AB 1 TP1 96 (SUTURE) IMPLANT
SUT VIC AB 0 CT1 27 (SUTURE)
SUT VIC AB 0 CT1 27XBRD ANTBC (SUTURE) IMPLANT
SUT VIC AB 2-0 CT1 27 (SUTURE)
SUT VIC AB 2-0 CT1 TAPERPNT 27 (SUTURE) IMPLANT
SUT VIC AB 3-0 SH 27 (SUTURE) ×2
SUT VIC AB 3-0 SH 27XBRD (SUTURE) IMPLANT
SUT VIC AB 4-0 PS2 18 (SUTURE) ×4 IMPLANT
SYR 10ML LL (SYRINGE) ×1 IMPLANT
SYR 20ML LL LF (SYRINGE) ×1 IMPLANT
TOWEL OR NON WOVEN STRL DISP B (DISPOSABLE) ×2 IMPLANT
TRAP SPECIMEN MUCUS 40CC (MISCELLANEOUS) IMPLANT
TRAY FOLEY MTR SLVR 16FR STAT (SET/KITS/TRAYS/PACK) ×2 IMPLANT
TROCAR XCEL NON-BLD 5MMX100MML (ENDOMECHANICALS) IMPLANT
UNDERPAD 30X36 HEAVY ABSORB (UNDERPADS AND DIAPERS) ×2 IMPLANT
WATER STERILE IRR 1000ML POUR (IV SOLUTION) ×2 IMPLANT

## 2020-10-27 NOTE — Progress Notes (Signed)
Dr. Denman George notified about pt inability to void. Order for in and out cath put in.

## 2020-10-27 NOTE — Anesthesia Procedure Notes (Signed)
Procedure Name: Intubation Date/Time: 10/27/2020 7:46 AM Performed by: Eben Burow, CRNA Pre-anesthesia Checklist: Patient identified, Emergency Drugs available, Suction available, Patient being monitored and Timeout performed Patient Re-evaluated:Patient Re-evaluated prior to induction Oxygen Delivery Method: Circle system utilized Preoxygenation: Pre-oxygenation with 100% oxygen Induction Type: IV induction Ventilation: Mask ventilation without difficulty Laryngoscope Size: Mac and 4 Grade View: Grade I Tube type: Oral Number of attempts: 1 Airway Equipment and Method: Stylet Placement Confirmation: ETT inserted through vocal cords under direct vision,  positive ETCO2 and breath sounds checked- equal and bilateral Secured at: 22 cm Tube secured with: Tape Dental Injury: Teeth and Oropharynx as per pre-operative assessment

## 2020-10-27 NOTE — Anesthesia Postprocedure Evaluation (Signed)
Anesthesia Post Note  Patient: [name]  Procedure(s) Performed: XI ROBOTIC ASSISTED TOTAL HYSTERECTOMY WITH BILATERAL SALPINGO OOPHORECTOMY (N/A ) SENTINEL NODE BIOPSY (N/A )     Patient location during evaluation: PACU Anesthesia Type: General Level of consciousness: sedated Pain management: pain level controlled Vital Signs Assessment: post-procedure vital signs reviewed and stable Respiratory status: spontaneous breathing and respiratory function stable Cardiovascular status: stable Postop Assessment: no apparent nausea or vomiting Anesthetic complications: no   No complications documented.  Last Vitals:  Vitals:   10/27/20 1030 10/27/20 1045  BP: 136/85 140/79  Pulse: 85 88  Resp: 17 (!) 21  Temp: (!) 36.4 C (!) 36.4 C  SpO2: 97% 97%    Last Pain:  Vitals:   10/27/20 1030  TempSrc:   PainSc: 0-No pain                 Jibri Schriefer DANIEL

## 2020-10-27 NOTE — Interval H&P Note (Signed)
History and Physical Interval Note:  10/27/2020 7:08 AM  [name]  has presented today for surgery, with the diagnosis of ENDOMETRIAL CANCER.  The various methods of treatment have been discussed with the patient and family. After consideration of risks, benefits and other options for treatment, the patient has consented to  Procedure(s): XI ROBOTIC ASSISTED TOTAL HYSTERECTOMY WITH BILATERAL SALPINGO OOPHORECTOMY (N/A) SENTINEL NODE BIOPSY (N/A) as a surgical intervention.  The patient's history has been reviewed, patient examined, no change in status, stable for surgery. We discussed her risk for trendelenberg with her neurologist who did not endorse that it was unsafe for her to have trendelenberg positining. Her HbA1c was acceptable on evaluation. Her cardiologist felt that she was optimized for surgery.  I have reviewed the patient's chart and labs.  Questions were answered to the patient's satisfaction.     Thereasa Solo

## 2020-10-27 NOTE — Op Note (Signed)
OPERATIVE NOTE 10/27/20  Surgeon: Donaciano Eva   Assistants: Dr Lahoma Crocker (an MD assistant was necessary for tissue manipulation, management of robotic instrumentation, retraction and positioning due to the complexity of the case and hospital policies).   Anesthesia: General endotracheal anesthesia  ASA Class: 3   Pre-operative Diagnosis: endometrial cancer grade 1 with deep myometrial invasion  Post-operative Diagnosis: same,   Operation: Robotic-assisted laparoscopic total hysterectomy with bilateral salpingoophorectomy, SLN biopsy   Surgeon: Donaciano Eva  Assistant Surgeon: Lahoma Crocker MD  Anesthesia: GET  Urine Output:  50cc  Operative Findings:  : 10cm fibroid uterus, anterior prolapse, suspicious left external iliac SLN, normal peritoneum.   Estimated Blood Loss:  10cc      Total IV Fluids: 800 ml         Specimens: uterus, cervix, bilateral tubes and ovaries, left external iliac SLN, right obturator SLN         Complications:  None; patient tolerated the procedure well.         Disposition: PACU - hemodynamically stable.  Procedure Details  The patient was [name] in the Holding Room. The risks, benefits, complications, treatment options, and expected outcomes were discussed with the patient.  The patient concurred with the proposed plan, giving informed consent.  The site of surgery properly noted/marked. The patient was identified as [name] and the procedure verified as a Robotic-assisted hysterectomy with bilateral salpingo oophorectomy with SLN biopsy. A Time Out was held and the above information confirmed.  After induction of anesthesia, the patient was draped and prepped in the usual sterile manner. Pt was placed in supine position after anesthesia and draped and prepped in the usual sterile manner. The abdominal drape was placed after the CholoraPrep had been allowed to dry for 3 minutes.  Her arms were tucked to her side  with all appropriate precautions.  The shoulders were stabilized with padded shoulder blocks applied to the acromium processes.  The patient was placed in the semi-lithotomy position in [place].  The perineum was prepped with Betadine. The patient was then prepped. Foley catheter was placed.  A sterile speculum was placed in the vagina.  The cervix was grasped with a single-tooth tenaculum. 2mg  total of ICG was injected into the cervical stroma at 2 and 9 o'clock with 1cc injected at a 1cm and 70mm depth (concentration 0.5mg /ml) in all locations. The cervix was dilated with Kennon Rounds dilators.  The ZUMI uterine manipulator with a medium colpotomizer ring was placed without difficulty.  A pneum occluder balloon was placed over the manipulator.  OG tube placement was confirmed and to suction.   Next, a 5 mm skin incision was made 1 cm below the subcostal margin in the midclavicular line.  The 5 mm Optiview port and scope was used for direct entry.  Opening pressure was under 10 mm CO2.  The abdomen was insufflated and the findings were noted as above.   At this point and all points during the procedure, the patient's intra-abdominal pressure did not exceed 15 mmHg. Next, a 10 mm skin incision was made 3cm above the umbilicus and a right and left port was placed about 10 cm lateral to the robot port on the right and left side.  A fourth arm was placed in the left lower quadrant 2 cm above and superior and medial to the anterior superior iliac spine.  All ports were placed under direct visualization.  The patient was placed in 23 degrees Trendelenburg (less than  typical due to her history of a SAH).  Bowel was folded away into the upper abdomen.  The robot was docked in the normal manner.  The right and left peritoneum were opened parallel to the IP ligament to open the retroperitoneal spaces bilaterally. The SLN mapping was performed in bilateral pelvic basins. The para rectal and paravesical spaces were opened  up entirely with careful dissection below the level of the ureters bilaterally and to the depth of the uterine artery origin in order to skeletonize the uterine "web" and ensure visualization of all parametrial channels. The para-aortic basins were carefully exposed and evaluated for isolated para-aortic SLN's. Lymphatic channels were identified travelling to the following visualized sentinel lymph node's: right obturator and left external iliac. These SLN's were separated from their surrounding lymphatic tissue, removed and sent for permanent pathology.  The hysterectomy was started after the round ligament on the right side was incised and the retroperitoneum was entered and the pararectal space was developed.  The ureter was noted to be on the medial leaf of the broad ligament.  The peritoneum above the ureter was incised and stretched and the infundibulopelvic ligament was skeletonized, cauterized and cut.  The posterior peritoneum was taken down to the level of the KOH ring.  The anterior peritoneum was also taken down.  The bladder flap was created to the level of the KOH ring.  The uterine artery on the right side was skeletonized, cauterized and cut in the normal manner.  A similar procedure was performed on the left.  The colpotomy was made and the uterus, cervix, bilateral ovaries and tubes were amputated and delivered through the vagina.  Pedicles were inspected and excellent hemostasis was achieved.    The colpotomy at the vaginal cuff was closed with Vicryl on a CT1 needle in a running manner.  Irrigation was used and excellent hemostasis was achieved.  At this point in the procedure was completed.  Robotic instruments were removed under direct visulaization.  The robot was undocked. The 10 mm ports were closed with Vicryl on a UR-5 needle and the fascia was closed with 0 Vicryl on a UR-5 needle.  The skin was closed with 4-0 Vicryl in a subcuticular manner.  Dermabond was applied.  Sponge, lap and  needle counts correct x 2.  The patient was taken to the recovery room in stable condition.  The vagina was swabbed with  minimal bleeding noted.   All instrument and needle counts were correct x  3.   The patient was transferred to the recovery room in a stable condition.  Donaciano Eva, MD

## 2020-10-27 NOTE — Transfer of Care (Signed)
Immediate Anesthesia Transfer of Care Note  Patient: [name]  Procedure(s) Performed: XI ROBOTIC ASSISTED TOTAL HYSTERECTOMY WITH BILATERAL SALPINGO OOPHORECTOMY (N/A ) SENTINEL NODE BIOPSY (N/A )  Patient Location: PACU  Anesthesia Type:General  Level of Consciousness: awake and patient cooperative  Airway & Oxygen Therapy: Patient Spontanous Breathing and Patient connected to face mask oxygen  Post-op Assessment: Report given to RN and Post -op Vital signs reviewed and stable  Post vital signs: Reviewed and stable  Last Vitals:  Vitals Value Taken Time  BP 110/82 10/27/20 0939  Temp    Pulse 57 10/27/20 0940  Resp 15 10/27/20 0943  SpO2 83 % 10/27/20 0940  Vitals shown include unvalidated device data.  Last Pain:  Vitals:   10/27/20 0603  TempSrc: Oral  PainSc:       Patients Stated Pain Goal: 3 (91/36/85 [id number])  Complications: No complications documented.

## 2020-10-27 NOTE — Progress Notes (Addendum)
Pt c/o needing to urinate but being unable. Pt states she has to be in and out catheterized after every procedure she has. Bladder scan done 124mL noted. Pt still uncomfortable.

## 2020-10-27 NOTE — Discharge Instructions (Signed)
10/27/2020  Return to work: 4 weeks  Activity: 1. Be up and out of the bed during the day.  Take a nap if needed.  You may walk up steps but be careful and use the hand rail.  Stair climbing will tire you more than you think, you may need to stop part way and rest.   2. No lifting or straining for 6 weeks.  3. No driving for 1 week.  Do Not drive if you are taking narcotic pain medicine.  4. Shower daily.  Use soap and water on your incision and pat dry; don't rub.   5. No sexual activity and nothing in the vagina for 8 weeks.  Medications:  - DO NOT TAKE ASPIRIN FOR THE FIRST 7 DAYS AFTER SURGERY  - Take ibuprofen and tylenol first line for pain control. Take these regularly (every 6 hours) to decrease the build up of pain.  - If necessary, for severe pain not relieved by ibuprofen, take oxycodone.  - While taking oxycodone you should take sennakot every night to reduce the likelihood of constipation. If this causes diarrhea, stop its use.  Diet: 1. Low sodium Heart Healthy Diet is recommended.  2. It is safe to use a laxative if you have difficulty moving your bowels.   Wound Care: 1. Keep clean and dry.  Shower daily.  Reasons to call the Doctor:   Fever - Oral temperature greater than 100.4 degrees Fahrenheit  Foul-smelling vaginal discharge  Difficulty urinating  Nausea and vomiting  Increased pain at the site of the incision that is unrelieved with pain medicine.  Difficulty breathing with or without chest pain  New calf pain especially if only on one side  Sudden, continuing increased vaginal bleeding with or without clots.   Follow-up: 1. See Everitt Amber in 3 weeks.  Contacts: For questions or concerns you should contact:  Dr. Everitt Amber at [phone] After hours and on week-ends call [phone] and ask to speak to the physician on call for Gynecologic Oncology

## 2020-10-27 NOTE — Progress Notes (Signed)
Dr. Tobias Alexander and Melanie,CRNA made aware of patient blood sugar of 227.  No treatment at this time. Patient does not take any meds for her diabetes, most diabetic meds are listed as allergies on patients allergy list.

## 2020-10-28 ENCOUNTER — Other Ambulatory Visit: Payer: Self-pay

## 2020-10-28 ENCOUNTER — Telehealth: Payer: Self-pay

## 2020-10-28 ENCOUNTER — Emergency Department (HOSPITAL_COMMUNITY): Payer: Medicare HMO

## 2020-10-28 ENCOUNTER — Observation Stay (HOSPITAL_COMMUNITY)
Admission: EM | Admit: 2020-10-28 | Discharge: 2020-10-30 | Disposition: A | Discharge status: Home / Self Care | Payer: Medicare HMO | Attending: Internal Medicine | Admitting: Internal Medicine

## 2020-10-28 ENCOUNTER — Encounter: Payer: Self-pay | Admitting: Gynecologic Oncology

## 2020-10-28 ENCOUNTER — Encounter (HOSPITAL_COMMUNITY): Payer: Self-pay | Admitting: Gynecologic Oncology

## 2020-10-28 DIAGNOSIS — N189 Chronic kidney disease, unspecified: Secondary | ICD-10-CM | POA: Insufficient documentation

## 2020-10-28 DIAGNOSIS — R29810 Facial weakness: Secondary | ICD-10-CM | POA: Diagnosis present

## 2020-10-28 DIAGNOSIS — Z20822 Contact with and (suspected) exposure to covid-19: Secondary | ICD-10-CM | POA: Insufficient documentation

## 2020-10-28 DIAGNOSIS — E785 Hyperlipidemia, unspecified: Secondary | ICD-10-CM

## 2020-10-28 DIAGNOSIS — I6521 Occlusion and stenosis of right carotid artery: Secondary | ICD-10-CM

## 2020-10-28 DIAGNOSIS — J45909 Unspecified asthma, uncomplicated: Secondary | ICD-10-CM | POA: Diagnosis not present

## 2020-10-28 DIAGNOSIS — Z86010 Personal history of colonic polyps: Secondary | ICD-10-CM | POA: Diagnosis not present

## 2020-10-28 DIAGNOSIS — F32A Depression, unspecified: Secondary | ICD-10-CM

## 2020-10-28 DIAGNOSIS — G459 Transient cerebral ischemic attack, unspecified: Secondary | ICD-10-CM | POA: Diagnosis not present

## 2020-10-28 DIAGNOSIS — Z8542 Personal history of malignant neoplasm of other parts of uterus: Secondary | ICD-10-CM | POA: Diagnosis not present

## 2020-10-28 DIAGNOSIS — Z7982 Long term (current) use of aspirin: Secondary | ICD-10-CM | POA: Diagnosis not present

## 2020-10-28 DIAGNOSIS — E119 Type 2 diabetes mellitus without complications: Secondary | ICD-10-CM | POA: Insufficient documentation

## 2020-10-28 DIAGNOSIS — Z79899 Other long term (current) drug therapy: Secondary | ICD-10-CM | POA: Diagnosis not present

## 2020-10-28 DIAGNOSIS — I129 Hypertensive chronic kidney disease with stage 1 through stage 4 chronic kidney disease, or unspecified chronic kidney disease: Secondary | ICD-10-CM | POA: Insufficient documentation

## 2020-10-28 DIAGNOSIS — E039 Hypothyroidism, unspecified: Secondary | ICD-10-CM | POA: Diagnosis not present

## 2020-10-28 DIAGNOSIS — E1165 Type 2 diabetes mellitus with hyperglycemia: Secondary | ICD-10-CM | POA: Diagnosis not present

## 2020-10-28 DIAGNOSIS — C541 Malignant neoplasm of endometrium: Secondary | ICD-10-CM | POA: Diagnosis present

## 2020-10-28 DIAGNOSIS — E1122 Type 2 diabetes mellitus with diabetic chronic kidney disease: Secondary | ICD-10-CM | POA: Diagnosis not present

## 2020-10-28 DIAGNOSIS — F419 Anxiety disorder, unspecified: Secondary | ICD-10-CM

## 2020-10-28 DIAGNOSIS — I6782 Cerebral ischemia: Secondary | ICD-10-CM | POA: Diagnosis not present

## 2020-10-28 DIAGNOSIS — T7840XA Allergy, unspecified, initial encounter: Secondary | ICD-10-CM | POA: Diagnosis not present

## 2020-10-28 DIAGNOSIS — I6529 Occlusion and stenosis of unspecified carotid artery: Secondary | ICD-10-CM

## 2020-10-28 DIAGNOSIS — I63231 Cerebral infarction due to unspecified occlusion or stenosis of right carotid arteries: Secondary | ICD-10-CM | POA: Diagnosis not present

## 2020-10-28 DIAGNOSIS — E1129 Type 2 diabetes mellitus with other diabetic kidney complication: Secondary | ICD-10-CM | POA: Diagnosis not present

## 2020-10-28 DIAGNOSIS — L299 Pruritus, unspecified: Secondary | ICD-10-CM | POA: Diagnosis not present

## 2020-10-28 DIAGNOSIS — R4781 Slurred speech: Secondary | ICD-10-CM | POA: Diagnosis not present

## 2020-10-28 DIAGNOSIS — E1169 Type 2 diabetes mellitus with other specified complication: Secondary | ICD-10-CM | POA: Diagnosis not present

## 2020-10-28 DIAGNOSIS — G238 Other specified degenerative diseases of basal ganglia: Secondary | ICD-10-CM | POA: Diagnosis present

## 2020-10-28 DIAGNOSIS — T782XXA Anaphylactic shock, unspecified, initial encounter: Secondary | ICD-10-CM | POA: Diagnosis not present

## 2020-10-28 DIAGNOSIS — E782 Mixed hyperlipidemia: Secondary | ICD-10-CM | POA: Diagnosis not present

## 2020-10-28 DIAGNOSIS — I10 Essential (primary) hypertension: Secondary | ICD-10-CM | POA: Diagnosis not present

## 2020-10-28 LAB — I-STAT CHEM 8, ED
BUN: 17 mg/dL (ref 8–23)
Calcium, Ion: 1.19 mmol/L (ref 1.15–1.40)
Chloride: 98 mmol/L (ref 98–111)
Creatinine, Ser: 1 mg/dL (ref 0.44–1.00)
Glucose, Bld: 200 mg/dL — ABNORMAL HIGH (ref 70–99)
HCT: 37 % (ref 36.0–46.0)
Hemoglobin: 12.6 g/dL (ref 12.0–15.0)
Potassium: 3.8 mmol/L (ref 3.5–5.1)
Sodium: 140 mmol/L (ref 135–145)
TCO2: 27 mmol/L (ref 22–32)

## 2020-10-28 LAB — CBG MONITORING, ED: Glucose-Capillary: 178 mg/dL — ABNORMAL HIGH (ref 70–99)

## 2020-10-28 LAB — COMPREHENSIVE METABOLIC PANEL
ALT: 14 U/L (ref 0–44)
AST: 19 U/L (ref 15–41)
Albumin: 3.8 g/dL (ref 3.5–5.0)
Alkaline Phosphatase: 54 U/L (ref 38–126)
Anion gap: 11 (ref 5–15)
BUN: 15 mg/dL (ref 8–23)
CO2: 28 mmol/L (ref 22–32)
Calcium: 9.6 mg/dL (ref 8.9–10.3)
Chloride: 100 mmol/L (ref 98–111)
Creatinine, Ser: 1.07 mg/dL — ABNORMAL HIGH (ref 0.44–1.00)
GFR, Estimated: 56 mL/min — ABNORMAL LOW (ref >60)
Glucose, Bld: 207 mg/dL — ABNORMAL HIGH (ref 70–99)
Potassium: 3.9 mmol/L (ref 3.5–5.1)
Sodium: 139 mmol/L (ref 135–145)
Total Bilirubin: 0.9 mg/dL (ref 0.3–1.2)
Total Protein: 6.7 g/dL (ref 6.5–8.1)

## 2020-10-28 LAB — CBC
HCT: 37.9 % (ref 36.0–46.0)
Hemoglobin: 12.6 g/dL (ref 12.0–15.0)
MCH: 30.8 pg (ref 26.0–34.0)
MCHC: 33.2 g/dL (ref 30.0–36.0)
MCV: 92.7 fL (ref 80.0–100.0)
Platelets: 212 10*3/uL (ref 150–400)
RBC: 4.09 MIL/uL (ref 3.87–5.11)
RDW: 13.6 % (ref 11.5–15.5)
WBC: 5.1 10*3/uL (ref 4.0–10.5)
nRBC: 0 % (ref 0.0–0.2)

## 2020-10-28 LAB — RESPIRATORY PANEL BY RT PCR (FLU A&B, COVID)
Influenza A by PCR: NEGATIVE
Influenza B by PCR: NEGATIVE
SARS Coronavirus 2 by RT PCR: NEGATIVE

## 2020-10-28 LAB — DIFFERENTIAL
Abs Immature Granulocytes: 0.01 10*3/uL (ref 0.00–0.07)
Basophils Absolute: 0 10*3/uL (ref 0.0–0.1)
Basophils Relative: 0 %
Eosinophils Absolute: 0.1 10*3/uL (ref 0.0–0.5)
Eosinophils Relative: 2 %
Immature Granulocytes: 0 %
Lymphocytes Relative: 28 %
Lymphs Abs: 1.4 10*3/uL (ref 0.7–4.0)
Monocytes Absolute: 0.6 10*3/uL (ref 0.1–1.0)
Monocytes Relative: 12 %
Neutro Abs: 2.9 10*3/uL (ref 1.7–7.7)
Neutrophils Relative %: 58 %

## 2020-10-28 LAB — PROTIME-INR
INR: 0.9 (ref 0.8–1.2)
Prothrombin Time: 12 seconds (ref 11.4–15.2)

## 2020-10-28 LAB — APTT: aPTT: 27 seconds (ref 24–36)

## 2020-10-28 MED ORDER — ASPIRIN 81 MG PO CHEW
81.0000 mg | CHEWABLE_TABLET | Freq: Every day | ORAL | Status: DC
  Filled 2020-10-28: qty 1

## 2020-10-28 MED ORDER — SODIUM CHLORIDE 0.9% FLUSH
3.0000 mL | Freq: Once | INTRAVENOUS | Status: DC
Start: 2020-10-28 — End: 2020-10-30

## 2020-10-28 MED ORDER — ACETAMINOPHEN 325 MG PO TABS: 650.0000 mg | ORAL_TABLET | Freq: Once | ORAL | Status: DC

## 2020-10-28 NOTE — ED Provider Notes (Signed)
[facility] EMERGENCY DEPARTMENT Provider Note   CSN: [id number] Arrival date & time: 10/28/20  2057  An emergency department physician performed an initial assessment on this suspected stroke patient at 2056.  History Chief Complaint  Patient presents with  . Code Stroke    [name] is a 69 y.o. female.  Last known well 0830 today when she was last seen by her family member.  About 2 hours prior to arrival, she thought that she was having allergic reaction because she has had some diffuse itching today, and about 2 hours prior to arrival, she had what felt like some swelling of the left side of her face and tongue.  EMS was called out for an allergic reaction, but on their arrival, they did not note any swelling, but they noted slurred speech and a left facial droop.  Symptoms were markedly improved on arrival but not completely resolved.  Reassuring CBG in transport, stable vitals.  The history is provided by the patient and the EMS personnel.  Neurologic Problem This is a new problem. The current episode started 1 to 2 hours ago. The problem occurs constantly. The problem has been rapidly improving. Associated symptoms include abdominal pain and headaches. Pertinent negatives include no chest pain and no shortness of breath. Nothing aggravates the symptoms. Nothing relieves the symptoms. She has tried nothing for the symptoms.       Past Medical History:  Diagnosis Date  . Allergy   . Anemia   . Arthritis   . Asthma    Dianosed years ago-no meds at this time  . Cancer [facility])    endometrial   . Cataract    Right eye removed-still has left cataract   . Chronic kidney disease 1997   Nepthrotic syndrom with minimal change-in remission per pt - last seen by kidney MD- 2 mos ago ? name of MD   . Diabetes mellitus    type 2   . Dyspnea    with exertion   . Dysrhythmia    "skips beats"  . Family history of adverse reaction to anesthesia    son  stopped breathing during surgery , mother slow to wake up   . Fibromyalgia 1980's  . Generalized anxiety disorder   . GERD (gastroesophageal reflux disease)    occ- will use Tums and Prolosec  prn   . Heart murmur   . Hyperlipemia   . Hypertension    not on blood pressure meds due to allergies - last dose of meds- months ago   . Hypothyroidism   . Irritable bowel syndrome 1980's  . Major depressive disorder   . Migraine headaches 1980's   On meds-well controlled  . 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  . Morbid obesity ([place])   . Pneumonia 2009   Several times over the past several years  . PONV (postoperative nausea and vomiting)    was told by neurologist due to calcificaton in brain not to be put to sleep, N/V during Op and Recovery   . Recurrent upper respiratory infection (URI)   . Sleep apnea    no cpap  . Subarachnoid hemorrhage ([place]) 2010  . Vaginal polyp    benign per pt    Patient Active Problem List   Diagnosis Date Noted  . TIA (transient ischemic attack) 10/29/2020  . 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 Endometrial cancer ([place]) 10/18/2020  . CAD (coronary artery disease) 05/11/2020  . Acute encephalopathy 02/26/2020  . Hypothyroidism   .  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  . Essential tremor 06/24/2018  . Migraine without aura 06/24/2018  . Stress at home 08/28/2015  . Post-menopausal bleeding 06/16/2011  . Kidney disease 06/16/2011  . Colon polyp 06/16/2011  . Vaginal polyp 06/16/2011  . Chronic diarrhea 06/16/2011  . Diverticulitis 06/16/2011  . DUB (dysfunctional uterine bleeding) 06/16/2011  . Fatty liver 06/16/2011  . Diabetes mellitus type II, non insulin dependent ([place]) 06/16/2011    Past Surgical History:  Procedure Laterality Date  . CHOLECYSTECTOMY  1983  . COLONOSCOPY  2008   DB  . [place] OF UTERUS  1972  . HYSTEROSCOPY WITH D & C  06/29/2011   Procedure: DILATATION AND CURETTAGE (D&C) /HYSTEROSCOPY;  Surgeon:  Donnamae Jude, MD;  Location: [facility];  Service: Gynecology;  Laterality: N/A;  . ROBOTIC ASSISTED TOTAL HYSTERECTOMY WITH BILATERAL SALPINGO OOPHERECTOMY N/A 10/27/2020   Procedure: XI ROBOTIC ASSISTED TOTAL HYSTERECTOMY WITH BILATERAL SALPINGO OOPHORECTOMY;  Surgeon: Everitt Amber, MD;  Location: [facility];  Service: Gynecology;  Laterality: N/A;  . SENTINEL NODE BIOPSY N/A 10/27/2020   Procedure: SENTINEL NODE BIOPSY;  Surgeon: Everitt Amber, MD;  Location: [facility];  Service: Gynecology;  Laterality: N/A;     OB History    Gravida  5   Para  3   Term  3   Preterm  0   AB  2   Living  3     SAB  2   TAB      Ectopic      Multiple      Live Births              Family History  Problem Relation Age of Onset  . Liver cancer Father        mets to liver   . Bladder Cancer Father   . Colon cancer Father        mets to colon   . Anesthesia problems Mother        hard to wake post op   . Cerebrovascular Disease Mother        Never had stroke, but had CEA.  . High blood pressure Other   . Diabetes Other   . Arthritis Other   . Anesthesia problems Son        stopped breathing post op   . Tremor Son   . Schizophrenia Daughter        Schizoaffective disorder  . Anesthesia problems Granddaughter        PONV  . Dementia Neg Hx   . Esophageal cancer Neg Hx   . Rectal cancer Neg Hx   . Stomach cancer Neg Hx   . Colon polyps Neg Hx     Social History   Tobacco Use  . Smoking status: Never Smoker  . Smokeless tobacco: Never Used  Vaping Use  . Vaping Use: Never used  Substance Use Topics  . Alcohol use: No    Comment: quit 1978  . Drug use: No    Home Medications Prior to Admission medications   Medication Sig Start Date End Date Taking? Authorizing Provider  acetaminophen (TYLENOL) 500 MG tablet Take 500 mg by mouth every 6 (six) hours as needed for moderate pain.    [provider]  amitriptyline (ELAVIL) 10 MG tablet Take 10 mg by mouth at bedtime.   08/24/20   [provider]  aspirin EC 81 MG tablet Take 1 tablet (81 mg total) by mouth daily. Swallow whole.  06/10/20   Jettie Booze, MD  bumetanide (BUMEX) 1 MG tablet Take 1 mg by mouth every morning. 02/05/20   [provider]  cyanocobalamin (,VITAMIN B-12,) 1000 MCG/ML injection Inject 1,000 mcg into the muscle every 30 (thirty) days.     [provider]  Evolocumab (REPATHA SURECLICK) 027 MG/ML SOAJ Inject 1 pen into the skin every 14 (fourteen) days. 05/11/20   Jettie Booze, MD  levothyroxine (SYNTHROID, LEVOTHROID) 50 MCG tablet Take 50 mcg by mouth daily.     [provider]  pantoprazole (PROTONIX) 40 MG tablet Take 1 tablet (40 mg total) by mouth daily. 06/10/20   Mauri Pole, MD  Potassium 99 MG TABS Take 198 mg by mouth daily.    [provider]  senna-docusate (SENOKOT-S) 8.6-50 MG tablet Take 2 tablets by mouth at bedtime. For AFTER surgery, do not take if having diarrhea 10/17/20   Cross, Lenna Sciara D, NP  traMADol (ULTRAM) 50 MG tablet Take 1 tablet (50 mg total) by mouth every 6 (six) hours as needed for severe pain. For AFTER surgery, do not take and drive 25/3/66   Joylene John D, NP    Allergies    Acarbose, Atenolol, Cinnamon, Glimepiride, Invokana [canagliflozin], Januvia [sitagliptin], Losartan potassium, Metformin and related, Other, Pioglitazone, Statins, Sulfamethoxazole, and Sulfa antibiotics  Review of Systems   Review of Systems  Constitutional: Negative for chills and fever.  HENT: Positive for facial swelling. Negative for ear pain and sore throat.   Eyes: Negative for pain and visual disturbance.  Respiratory: Negative for cough and shortness of breath.   Cardiovascular: Negative for chest pain and palpitations.  Gastrointestinal: Positive for abdominal pain. Negative for vomiting.  Genitourinary: Negative for dysuria and hematuria.  Musculoskeletal: Negative for arthralgias and back pain.  Skin:  Negative for color change and rash.  Neurological: Positive for speech difficulty and headaches. Negative for seizures and syncope.  All other systems reviewed and are negative.   Physical Exam Updated Vital Signs BP 123/66   Pulse 86   Temp 98.5 F (36.9 C) (Oral)   Resp 10   Ht 5\' 2"  (1.575 m)   Wt 80.7 kg   SpO2 99%   BMI 32.56 kg/m   Physical Exam Vitals and nursing note reviewed.  Constitutional:      Appearance: She is well-developed. She is not ill-appearing, toxic-appearing or diaphoretic.  HENT:     Head: Normocephalic and atraumatic.     Mouth/Throat:     Mouth: Mucous membranes are dry.     Pharynx: Oropharynx is clear.  Eyes:     Conjunctiva/sclera: Conjunctivae normal.     Pupils: Pupils are equal, round, and reactive to light.  Cardiovascular:     Rate and Rhythm: Normal rate and regular rhythm.     Heart sounds: No murmur heard.  No gallop.   Pulmonary:     Effort: Pulmonary effort is normal. No respiratory distress.     Breath sounds: Normal breath sounds. No wheezing or rhonchi.  Abdominal:     Palpations: Abdomen is soft.     Tenderness: There is generalized abdominal tenderness.     Comments: Hysterectomy sites, clean, dry, intact, minimal erythema, no purulence  Musculoskeletal:     Cervical back: Neck supple.  Skin:    General: Skin is warm and dry.  Neurological:     Mental Status: She is alert.     Comments: Mental status: alert and oriented to person, place, time, situation. Speech:  Speech is clear and language is not aphasic Fund of knowledge: Intact  Cranial Nerves:  II: Intact to confrontation bilaterally III, IV, VI: EOMI, no nystagmus V: face sensation intact, good masseter strength VII: very slight L facial droop VIII: gross hearing intact bilaterally IX/XI: palate elevates symmetrically XII: tongue protrudes symmetrically, no deviation  Strength: 5/5 and symmetric in BUE and BLE. No pronation or drift. Tone: normal tone, no  tremors Coordination: Intact finger to nose and heel to shin. Sensation: intact to light touch in all extremities.  Romberg not assessed due to acuity of condition.  Gait: Not assessed due to acuity of condition     ED Results / Procedures / Treatments   Labs (all labs ordered are listed, but only abnormal results are displayed) Labs Reviewed  COMPREHENSIVE METABOLIC PANEL - Abnormal; Notable for the following components:      Result Value   Glucose, Bld 207 (*)    Creatinine, Ser 1.07 (*)    GFR, Estimated 56 (*)    All other components within normal limits  I-STAT CHEM 8, ED - Abnormal; Notable for the following components:   Glucose, Bld 200 (*)    All other components within normal limits  CBG MONITORING, ED - Abnormal; Notable for the following components:   Glucose-Capillary 178 (*)    All other components within normal limits  RESPIRATORY PANEL BY RT PCR (FLU A&B, COVID)  PROTIME-INR  APTT  CBC  DIFFERENTIAL  HEMOGLOBIN A1C  LIPID PANEL    EKG None  Radiology DG Chest 2 View  Result Date: 10/29/2020 CLINICAL DATA:  Chest pain, postoperative day 1 from hysterectomy EXAM: CHEST - 2 VIEW COMPARISON:  None. FINDINGS: There is free air beneath the right hemidiaphragm and interstitial gas within the a abdominal wall noted on lateral examination in keeping with the given history of recent intra-abdominal surgery. Lungs are clear. No pneumothorax or pleural effusion. Cardiac size within normal limits. Pulmonary vascularity is normal. No acute bone abnormality. IMPRESSION: No active cardiopulmonary disease. Free air within the upper abdomen, likely postsurgical in nature. Electronically Signed   By: Fidela [name] MD   On: 10/29/2020 01:33   MR ANGIO HEAD WO CONTRAST  Result Date: 10/28/2020 CLINICAL DATA:  Stroke follow-up EXAM: MRI HEAD WITHOUT CONTRAST MRA HEAD WITHOUT CONTRAST TECHNIQUE: Multiplanar, multiecho pulse sequences of the brain and surrounding structures were  obtained without intravenous contrast. Angiographic images of the head were obtained using MRA technique without contrast. COMPARISON:  None. FINDINGS: MRI HEAD FINDINGS Brain: No acute infarct, acute hemorrhage or extra-axial collection. Multifocal hyperintense T2-weighted signal within the white matter. Mild generalized volume loss. Mineralization within the basal ganglia and dentate nuclei. Chronic microhemorrhage in the right frontal white matter. Normal midline structures. Vascular: Normal flow voids. Skull and upper cervical spine: The bone marrow signal of the cranium and upper cervical vertebrae is normal. There is no skull base lesion. The visualized upper cervical spinal cord is normal. Sinuses/Orbits: There is no paranasal sinus fluid level or advanced mucosal thickening. There is no mastoid or middle ear effusion. The orbits are normal. Other: None MRA HEAD FINDINGS POSTERIOR CIRCULATION: --Vertebral arteries: Normal --Inferior cerebellar arteries: Normal. --Basilar artery: Normal. --Superior cerebellar arteries: Normal. --Posterior cerebral arteries: Normal. ANTERIOR CIRCULATION: --Intracranial internal carotid arteries: Normal. --Anterior cerebral arteries (ACA): Normal. --Middle cerebral arteries (MCA): Normal. ANATOMIC VARIANTS: None IMPRESSION: 1. No acute intracranial abnormality. 2. Normal intracranial MRA. 3. Chronic small vessel disease and volume loss. Electronically Signed   By:  Ulyses Jarred M.D.   On: 10/28/2020 23:38   MR BRAIN WO CONTRAST  Result Date: 10/28/2020 CLINICAL DATA:  Stroke follow-up EXAM: MRI HEAD WITHOUT CONTRAST MRA HEAD WITHOUT CONTRAST TECHNIQUE: Multiplanar, multiecho pulse sequences of the brain and surrounding structures were obtained without intravenous contrast. Angiographic images of the head were obtained using MRA technique without contrast. COMPARISON:  None. FINDINGS: MRI HEAD FINDINGS Brain: No acute infarct, acute hemorrhage or extra-axial collection.  Multifocal hyperintense T2-weighted signal within the white matter. Mild generalized volume loss. Mineralization within the basal ganglia and dentate nuclei. Chronic microhemorrhage in the right frontal white matter. Normal midline structures. Vascular: Normal flow voids. Skull and upper cervical spine: The bone marrow signal of the cranium and upper cervical vertebrae is normal. There is no skull base lesion. The visualized upper cervical spinal cord is normal. Sinuses/Orbits: There is no paranasal sinus fluid level or advanced mucosal thickening. There is no mastoid or middle ear effusion. The orbits are normal. Other: None MRA HEAD FINDINGS POSTERIOR CIRCULATION: --Vertebral arteries: Normal --Inferior cerebellar arteries: Normal. --Basilar artery: Normal. --Superior cerebellar arteries: Normal. --Posterior cerebral arteries: Normal. ANTERIOR CIRCULATION: --Intracranial internal carotid arteries: Normal. --Anterior cerebral arteries (ACA): Normal. --Middle cerebral arteries (MCA): Normal. ANATOMIC VARIANTS: None IMPRESSION: 1. No acute intracranial abnormality. 2. Normal intracranial MRA. 3. Chronic small vessel disease and volume loss. Electronically Signed   By: Ulyses Jarred M.D.   On: 10/28/2020 23:38   CT HEAD CODE STROKE WO CONTRAST  Result Date: 10/28/2020 CLINICAL DATA:  Code stroke. Neuro deficit, acute, stroke suspected. Left facial droop. EXAM: CT HEAD WITHOUT CONTRAST TECHNIQUE: Contiguous axial images were obtained from the base of the skull through the vertex without intravenous contrast. COMPARISON:  Head CT and MRI 02/26/2020 FINDINGS: Brain: There is no evidence of an acute infarct, intracranial hemorrhage, mass, midline shift, or extra-axial fluid collection. Mild cerebral atrophy is within normal limits for age. Extensive symmetric calcification in the lentiform and dentate nuclei bilaterally is unchanged. Hypodensities in the cerebral white matter bilaterally are unchanged and nonspecific  but compatible with mild chronic small vessel ischemic disease. Vascular: Calcified atherosclerosis at the skull base. No suspicious acute arterial hyperdensity. Skull: No fracture or suspicious osseous lesion. Sinuses/Orbits: Visualized paranasal sinuses and mastoid air cells are clear. Right cataract extraction. Other: None. ASPECTS [facility] Stroke Program Early CT Score) - Ganglionic level infarction (caudate, lentiform nuclei, internal capsule, insula, M1-M3 cortex): 7 - Supraganglionic infarction (M4-M6 cortex): 3 Total score (0-10 with 10 being normal): 10 IMPRESSION: 1. No evidence of acute intracranial abnormality. ASPECTS is 10. 2. Mild chronic small vessel ischemic disease. 3. 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 These results were called by telephone at the time of interpretation on 10/28/2020 at 9:20 pm to Dr. Lorrin Goodell, who verbally acknowledged these results. Electronically Signed   By: Logan Bores M.D.   On: 10/28/2020 21:24    Procedures Procedures (including critical care time)  Medications Ordered in ED Medications  sodium chloride flush (NS) 0.9 % injection 3 mL (3 mLs Intravenous Not Given 10/29/20 0101)  acetaminophen (TYLENOL) tablet 650 mg (has no administration in time range)  insulin aspart (novoLOG) injection 0-9 Units (has no administration in time range)  insulin aspart (novoLOG) injection 0-5 Units (has no administration in time range)   stroke: mapping our early stages of recovery book (has no administration in time range)  acetaminophen (TYLENOL) tablet 650 mg (has no administration in time  range)    Or  acetaminophen (TYLENOL) 160 MG/5ML solution 650 mg (has no administration in time range)    Or  acetaminophen (TYLENOL) suppository 650 mg (has no administration in time range)  traMADol (ULTRAM) tablet 50 mg (has no administration in time range)  senna-docusate (Senokot-S) tablet 2 tablet (has no  administration in time range)  calcium carbonate (TUMS - dosed in mg elemental calcium) chewable tablet 200 mg of elemental calcium (has no administration in time range)  polyethylene glycol (MIRALAX / GLYCOLAX) packet 17 g (has no administration in time range)    ED Course  I have reviewed the triage vital signs and the nursing notes.  Pertinent labs & imaging results that were available during my care of the patient were reviewed by me and considered in my medical decision making (see chart for details).    MDM Rules/Calculators/A&P                          The patient is a 70yo female, PMH POD 1 laparoscopic neurectomy for uterine cancer with bilateral salpingo-oophorectomy, who presents to the ED via EMS for code stroke.  On my initial evaluation, the patient is hemodynamically stable, afebrile, nontoxic-appearing. Physical exam remarkable for very slight left facial droop. Neurology also concerned for L facial droop. Airway intact, bilateral breath sounds, normal glucose, so proceeded to CT scanner.  Differentials considered include CVA, TIA, electrolyte abnormality, postop infection, allergic reaction.  No swelling noted to tongue or face, no urticarial lesions or wheezing noted on exam, low suspicion for allergic reaction.  I am most concerned for CVA or TIA.    CT head with no evidence of acute stroke. Neurology requested admission for TIA workup, including MRIs. Informed patient of plan, and patient endorsing headache. Provided PO tylenol. Neurology requested aspirin, but patient declined.   MRIs without evidence of acute stroke. Consulted hospitalist for admission. Patient accepted to their service. No further acute events while under my care.   The care of this patient was overseen by Dr. Billy Fischer, who agreed with evaluation and plan of care.   Final Clinical Impression(s) / ED Diagnoses Final diagnoses:  TIA (transient ischemic attack)    Rx / DC Orders ED Discharge  Orders    None       Launa Flight, MD 10/29/20 Massie Kluver    Gareth Morgan, MD 10/29/20 2235

## 2020-10-28 NOTE — ED Notes (Signed)
Pt had surgery yesterday and pt stated that per her surgeon she was not to take aspirin. Aspirin not admistered. EDP notified.

## 2020-10-28 NOTE — Consult Note (Signed)
NEUROLOGY CONSULTATION NOTE   Date of service: October 28, 2020 Patient Name: [name] MRN:  [id number] DOB:  [birth year] Reason for consult: "stroke code"  History of Present Illness  [name] is a 69 y.o. female with PMH significant for CKD, DM2, GAD, GERD, HLD, Migraines, prior SAH who presents with a few hours of sensation of swelling in the left face. She is not entirely sure when her symptoms started but thinks it started around 1300 on 10/28/20.  She was noted to have mild L facial droop and slurred speech with difficulty with repeating phrases.  On my evaluation, she has facial droop with drooping of the lower left face but it also seem like upper part of the face is sagging too. She has no difficulty with eye closure and has symmetric frowning and eyebrow raise.  Of note, she underwent hysterectomy yesterday for grade 1 endometrial cancer.  NIHSS: 3 MRS: 0 TPA: outside the window Thrombectomy: symptoms too mild.   ROS   Constitutional Denies weight loss, fever and chills.  HEENT Denies changes in vision and hearing.  Respiratory Denies SOB and cough.  CV Denies palpitations and CP  GI Denies abdominal pain, nausea, vomiting and diarrhea.  GU Denies dysuria and urinary frequency.  MSK Denies myalgia and joint pain.  Skin Denies rash and pruritus.   Neurological Denies headache and syncope.   Psychiatric Denies recent changes in mood. Denies anxiety and depression.    Past History   Past Medical History:  Diagnosis Date  . Allergy   . Anemia   . Arthritis   . Asthma    Dianosed years ago-no meds at this time  . Cancer [facility])    endometrial   . Cataract    Right eye removed-still has left cataract   . Chronic kidney disease 1997   Nepthrotic syndrom with minimal change-in remission per pt - last seen by kidney MD- 2 mos ago ? name of MD   . Diabetes mellitus    type 2   . Dyspnea    with exertion   . Dysrhythmia    "skips beats"  .  Family history of adverse reaction to anesthesia    son stopped breathing during surgery , mother slow to wake up   . Fibromyalgia 1980's  . Generalized anxiety disorder   . GERD (gastroesophageal reflux disease)    occ- will use Tums and Prolosec  prn   . Heart murmur   . Hyperlipemia   . Hypertension    not on blood pressure meds due to allergies - last dose of meds- months ago   . Hypothyroidism   . Irritable bowel syndrome 1980's  . Major depressive disorder   . Migraine headaches 1980's   On meds-well controlled  . 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  . Morbid obesity ([place])   . Pneumonia 2009   Several times over the past several years  . PONV (postoperative nausea and vomiting)    was told by neurologist due to calcificaton in brain not to be put to sleep, N/V during Op and Recovery   . Recurrent upper respiratory infection (URI)   . Sleep apnea    no cpap  . Subarachnoid hemorrhage ([place]) 2010  . Vaginal polyp    benign per pt   Past Surgical History:  Procedure Laterality Date  . CHOLECYSTECTOMY  1983  . COLONOSCOPY  2008   DB  . [place] OF UTERUS  1972  . HYSTEROSCOPY WITH  D & C  06/29/2011   Procedure: DILATATION AND CURETTAGE (D&C) /HYSTEROSCOPY;  Surgeon: Donnamae Jude, MD;  Location: [facility];  Service: Gynecology;  Laterality: N/A;  . ROBOTIC ASSISTED TOTAL HYSTERECTOMY WITH BILATERAL SALPINGO OOPHERECTOMY N/A 10/27/2020   Procedure: XI ROBOTIC ASSISTED TOTAL HYSTERECTOMY WITH BILATERAL SALPINGO OOPHORECTOMY;  Surgeon: Everitt Amber, MD;  Location: [facility];  Service: Gynecology;  Laterality: N/A;  . SENTINEL NODE BIOPSY N/A 10/27/2020   Procedure: SENTINEL NODE BIOPSY;  Surgeon: Everitt Amber, MD;  Location: [facility];  Service: Gynecology;  Laterality: N/A;   Family History  Problem Relation Age of Onset  . Liver cancer Father        mets to liver   . Bladder Cancer Father   . Colon cancer Father        mets to colon   . Anesthesia problems Mother         hard to wake post op   . High blood pressure Other   . Diabetes Other   . Arthritis Other   . Anesthesia problems Son        stopped breathing post op   . Tremor Son   . Schizophrenia Daughter        Schizoaffective disorder  . Anesthesia problems Granddaughter        PONV  . Dementia Neg Hx   . Esophageal cancer Neg Hx   . Rectal cancer Neg Hx   . Stomach cancer Neg Hx   . Colon polyps Neg Hx    Social History   Socioeconomic History  . Marital status: Divorced    Spouse name: Not on file  . Number of children: 3  . Years of education: 60  . Highest education level: Not on file  Occupational History  . Occupation: Retired   Tobacco Use  . Smoking status: Never Smoker  . Smokeless tobacco: Never Used  Vaping Use  . Vaping Use: Never used  Substance and Sexual Activity  . Alcohol use: No    Comment: quit 1978  . Drug use: No  . Sexual activity: Not Currently  Other Topics Concern  . Not on file  Social History Narrative   Lives at home. Her granddaughter lives with her.    Right handed   Social Determinants of Health   Financial Resource Strain:   . Difficulty of Paying Living Expenses: Not on file  Food Insecurity:   . Worried About Charity fundraiser in the Last Year: Not on file  . Ran Out of Food in the Last Year: Not on file  Transportation Needs:   . Lack of Transportation (Medical): Not on file  . Lack of Transportation (Non-Medical): Not on file  Physical Activity:   . Days of Exercise per Week: Not on file  . Minutes of Exercise per Session: Not on file  Stress:   . Feeling of Stress : Not on file  Social Connections:   . Frequency of Communication with Friends and Family: Not on file  . Frequency of Social Gatherings with Friends and Family: Not on file  . Attends Religious Services: Not on file  . Active Member of Clubs or Organizations: Not on file  . Attends Archivist Meetings: Not on file  . Marital Status: Not on file    Allergies  Allergen Reactions  . Acarbose   . Atenolol     Stomach problems, chills  . Cinnamon     Stomach pain  . Glimepiride  ALL DIABETIC MEDS PER PT   . Invokana [Canagliflozin]     ALL DIABETIC MEDS PER PT   . Januvia [Sitagliptin]     ALL DIABETIC MEDS PER PT   . Losartan Potassium   . Metformin And Related Nausea And Vomiting and Other (See Comments)    Patient states that she has severe chills, headache and cramping additionally. Says blood sugar is uncontrolled because of this  . Other Other (See Comments)    ALL DIABETIC MEDS PER PT   . Pioglitazone Other (See Comments)    ALL DIABETIC MEDS PER PT   . Statins     Myalgia  . Sulfamethoxazole Other (See Comments)    ALL DIABETIC MEDS PER PT   . Sulfa Antibiotics Rash and Other (See Comments)    Mother has told patient in the past not to take, she cannot recall there reaction    Medications  (Not in a hospital admission)    Vitals   There were no vitals filed for this visit.   There is no height or weight on file to calculate BMI.  Physical Exam   General: Laying comfortably in bed; in no acute distress.  HENT: Normal oropharynx and mucosa. Normal external appearance of ears and nose.  Neck: Supple, no pain or tenderness  CV: No JVD. No peripheral edema.  Pulmonary: Symmetric Chest rise. Normal respiratory effort.  Abdomen: Soft to touch, non-tender.  Ext: No cyanosis, edema, or deformity  Skin: No rash. Normal palpation of skin.   Musculoskeletal: Normal digits and nails by inspection. No clubbing.   Neurologic Examination  Mental status/Cognition: Alert, oriented to self, place, month and year, good attention.  Speech/language: Fluent, comprehension intact, object naming intact, repetition intact.  Cranial nerves:   CN II Pupils equal and reactive to light, no VF deficits    CN III,IV,VI EOM intact, no gaze preference or deviation, no nystagmus    CN V normal sensation in V1, V2, and V3  segments bilaterally    CN VII Mild L facial droop, some concern for involvement of the upper face, thou has symmetric frowning and eye brow raise and eye closure/.   CN VIII normal hearing to speec   CN IX & X normal palatal elevation, no uvular deviation   CN XI 5/5 head turn and 5/5 shoulder shrug bilaterally    CN XII midline tongue protrusion    Motor:  Muscle bulk: normal, tone increased, pronator drift none tremor yes. Mvmt Root Nerve  Muscle Right Left Comments  SA C5/6 Ax Deltoid 5 5   EF C5/6 Mc Biceps 5 5   EE C6/7/8 Rad Triceps 5 5   WF C6/7 Med FCR 5 5   WE C7/8 PIN ECU 5 5   F Ab C8/T1 U ADM/FDI 5 5   HF L1/2/3 Fem Illopsoas 5 5   KE L2/3/4 Fem Quad 5 5   DF L4/5 D Peron Tib Ant 5 5   PF S1/2 Tibial Grc/Sol 5 5    Reflexes:  Right Left Comments  Pectoralis      Biceps (C5/6) 1 1   Brachioradialis (C5/6) 1 1    Triceps (C6/7) 1 1    Patellar (L3/4) 1 1    Achilles (S1) 1 1    Hoffman      Plantar     Jaw jerk    Sensation:  Light touch Intact throughout   Pin prick    Temperature    Vibration  Proprioception    Coordination/Complex Motor:  - Finger to Nose intact BL - Heel to shin intact BL - Rapid alternating movement are normal - Gait: deferred.  Labs   CBC: No results for input(s): WBC, NEUTROABS, HGB, HCT, MCV, PLT in the last 168 hours.  Basic Metabolic Panel:  Lab Results  Component Value Date   NA 137 10/18/2020   K 3.9 10/18/2020   CO2 28 10/18/2020   GLUCOSE 241 (H) 10/18/2020   BUN 16 10/18/2020   CREATININE 0.94 10/18/2020   CALCIUM 9.3 10/18/2020   GFRNONAA >60 10/18/2020   GFRAA 73 04/12/2020   Lipid Panel:  Lab Results  Component Value Date   LDLCALC 56 09/02/2020   HgbA1c:  Lab Results  Component Value Date   HGBA1C 7.2 (H) 09/29/2020   Urine Drug Screen:     Component Value Date/Time   LABOPIA NONE DETECTED 02/26/2020 0239   COCAINSCRNUR NONE DETECTED 02/26/2020 0239   LABBENZ NONE DETECTED 02/26/2020 0239    AMPHETMU NONE DETECTED 02/26/2020 0239   THCU NONE DETECTED 02/26/2020 0239   LABBARB NONE DETECTED 02/26/2020 0239    Alcohol Level     Component Value Date/Time   ETH <10 02/25/2020 2339    CTH without contrast: Consistent with Fahr's disease with calcification, no large hypodensity concerning for a large territory infarct or hyperdensity concerning for an ICH. All the noted hyperdenisities are visible on the bone window.  Impression   Shakeita Kenzli Barritt is a 69 y.o. female with PMH significant for CKD, DM2, GAD, GERD, HLD, Migraines, prior SAH who presents with a few hours of sensation of swelling in the left face and left facial droop on exam. Some concern for involvement of the upper face too as her entire left face appears to be sagging but she does not have any weakness of the eye closure, symmetric frowning and eye brow raise BL. Recommendations   - Frequent Neuro checks per stroke unit protocol - Recommend brain imaging with MRI Brain without contrast - Recommend Vascular imaging with MRA Angio Head without contrast and US Carotid doppler - Recommend obtaining TTE - Recommend obtaining Lipid panel with LDL - Please start statin if LDL > 70 - Recommend HbA1c - Antithrombotic - aspirin 81 mg daily. - Recommend DVT ppx - SBP goal - permissive hypertension first 24 h < 220/110. Held home meds.  - Recommend Telemetry monitoring for arrythmia - Recommend bedside swallow screen prior to PO intake. - Stroke education booklet - Recommend PT/OT/SLP consult  ______________________________________________________________________   Thank you for the opportunity to take part in the care of this patient. If you have any further questions, please contact the neurology consultation attending.  Signed,  [place] Pager Number [phone]

## 2020-10-28 NOTE — Telephone Encounter (Signed)
[name] states that she is drinking and urinating well. She has taken in a protein drink and a juice drink in addition to water. She has not passed gas.  She took one senakot-s this am. She states that she was experiencing abdominal discomfort until she began with abdominal pain 4-5 a couple hours ago as well as some nausea. Abdomen is more distended this afternoon.  She hears gurgling in her abdomen, but not a high pitched roaring sound. No vomiting. She has used 3 tramadol alt with Tylenol 500 mg since last evening.  Reviewed with Joylene John, NP. Told [name] that she should take a senokot-s now with 8 oz of water and repeat with tonight.  She needs to stay on a liquid diet until she passes gas.  If she develops severe abdominal pain with N/V she needs to call the office. Pt requested a refill on the tramadol as she wants to have enough to get through the weekend.  Joylene John, NP sent in 5 additional tabs [facility] in [facility]. Afebrile. Incisions D&I Pt aware of post op appointments as well as the office number [phone] and after hours number [phone] to call if she has any questions or concerns

## 2020-10-28 NOTE — ED Triage Notes (Addendum)
Pt arrives to ED BIB [facility] as a Code Stroke. Per EMS they were called out for an allergic reaction due to pt feeling like her tongue was swelling. Per EMS upon their arrival it was noticed that pt has Lf sided facial droop and slurred speech. Per EMS pt's son stated that LKW was at 0830 today. 25mg  Benadryl was administered by EMS. Per EMS pt also had a hysterectomy. Hx of HTN and Diabetes yesterday at [facility] [place] upon pt's arrival.  BP 156/88 HR 92 R 16 CBG 120

## 2020-10-28 NOTE — Telephone Encounter (Signed)
Lm for [name] to call the office to discuss how she is doing after her surgery yesterday.

## 2020-10-28 NOTE — Code Documentation (Signed)
Paged to code stroke   LKW 0830am patient presents with left sided facial droop and slurred speech  NIH 3 CBG 178 CT neg, no interventions per Neurologist   Patient had hysterectomy yesterday day at [facility] so not a tPa candidate.

## 2020-10-28 NOTE — ED Notes (Signed)
Patient transported to MRI. Unable to get updated vital signs and modified NIHSS at this time.

## 2020-10-29 ENCOUNTER — Observation Stay (HOSPITAL_BASED_OUTPATIENT_CLINIC_OR_DEPARTMENT_OTHER): Payer: Medicare HMO

## 2020-10-29 ENCOUNTER — Observation Stay (HOSPITAL_COMMUNITY): Payer: Medicare HMO

## 2020-10-29 ENCOUNTER — Encounter (HOSPITAL_COMMUNITY): Payer: Self-pay | Admitting: Internal Medicine

## 2020-10-29 DIAGNOSIS — C541 Malignant neoplasm of endometrium: Secondary | ICD-10-CM | POA: Diagnosis not present

## 2020-10-29 DIAGNOSIS — G238 Other specified degenerative diseases of basal ganglia: Secondary | ICD-10-CM | POA: Diagnosis present

## 2020-10-29 DIAGNOSIS — E119 Type 2 diabetes mellitus without complications: Secondary | ICD-10-CM

## 2020-10-29 DIAGNOSIS — G459 Transient cerebral ischemic attack, unspecified: Secondary | ICD-10-CM

## 2020-10-29 DIAGNOSIS — R29810 Facial weakness: Secondary | ICD-10-CM | POA: Diagnosis present

## 2020-10-29 DIAGNOSIS — R4781 Slurred speech: Secondary | ICD-10-CM | POA: Diagnosis not present

## 2020-10-29 DIAGNOSIS — I340 Nonrheumatic mitral (valve) insufficiency: Secondary | ICD-10-CM | POA: Diagnosis not present

## 2020-10-29 DIAGNOSIS — E785 Hyperlipidemia, unspecified: Secondary | ICD-10-CM

## 2020-10-29 DIAGNOSIS — R079 Chest pain, unspecified: Secondary | ICD-10-CM | POA: Diagnosis not present

## 2020-10-29 LAB — CBG MONITORING, ED
Glucose-Capillary: 192 mg/dL — ABNORMAL HIGH (ref 70–99)
Glucose-Capillary: 191 mg/dL — ABNORMAL HIGH (ref 70–99)
Glucose-Capillary: 178 mg/dL — ABNORMAL HIGH (ref 70–99)
Glucose-Capillary: 162 mg/dL — ABNORMAL HIGH (ref 70–99)
Glucose-Capillary: 190 mg/dL — ABNORMAL HIGH (ref 70–99)

## 2020-10-29 LAB — ECHOCARDIOGRAM COMPLETE
Area-P 1/2: 2.16 cm2
Height: 62 in
P 1/2 time: 105.2 msec
S' Lateral: 2.4 cm
Weight: 2848 oz

## 2020-10-29 LAB — HEMOGLOBIN A1C
Hgb A1c MFr Bld: 7.2 % — ABNORMAL HIGH (ref 4.8–5.6)
Mean Plasma Glucose: 159.94 mg/dL

## 2020-10-29 LAB — LIPID PANEL
Cholesterol: 101 mg/dL (ref 0–200)
HDL: 33 mg/dL — ABNORMAL LOW (ref >40)
LDL Cholesterol: 41 mg/dL (ref 0–99)
Total CHOL/HDL Ratio: 3.1 RATIO
Triglycerides: 137 mg/dL (ref <150)
VLDL: 27 mg/dL (ref 0–40)

## 2020-10-29 LAB — GLUCOSE, CAPILLARY: Glucose-Capillary: 177 mg/dL — ABNORMAL HIGH (ref 70–99)

## 2020-10-29 MED ORDER — ENOXAPARIN SODIUM 40 MG/0.4ML ~~LOC~~ SOLN: 40.0000 mg | SUBCUTANEOUS | Status: DC

## 2020-10-29 MED ORDER — PANTOPRAZOLE SODIUM 40 MG PO TBEC
40.0000 mg | DELAYED_RELEASE_TABLET | Freq: Every day | ORAL | Status: DC
  Administered 2020-10-30: 40 mg via ORAL
  Filled 2020-10-29 (×2): qty 1

## 2020-10-29 MED ORDER — ACETAMINOPHEN 500 MG PO TABS: 500.0000 mg | ORAL_TABLET | Freq: Four times a day (QID) | ORAL | Status: DC | PRN

## 2020-10-29 MED ORDER — ACETAMINOPHEN 160 MG/5ML PO SOLN: 650.0000 mg | ORAL | Status: DC | PRN

## 2020-10-29 MED ORDER — ACETAMINOPHEN 650 MG RE SUPP: 650.0000 mg | RECTAL | Status: DC | PRN

## 2020-10-29 MED ORDER — BUMETANIDE 1 MG PO TABS
1.0000 mg | ORAL_TABLET | Freq: Every morning | ORAL | Status: DC
  Administered 2020-10-29 – 2020-10-30 (×2): 1 mg via ORAL
  Filled 2020-10-29 (×2): qty 1

## 2020-10-29 MED ORDER — POLYETHYLENE GLYCOL 3350 17 G PO PACK: 17.0000 g | PACK | Freq: Every day | ORAL | Status: DC | PRN

## 2020-10-29 MED ORDER — INSULIN ASPART 100 UNIT/ML ~~LOC~~ SOLN
0.0000 [IU] | Freq: Three times a day (TID) | SUBCUTANEOUS | Status: DC
  Administered 2020-10-29 (×2): 2 [IU] via SUBCUTANEOUS
  Administered 2020-10-30 (×2): 3 [IU] via SUBCUTANEOUS
  Administered 2020-10-30: 2 [IU] via SUBCUTANEOUS

## 2020-10-29 MED ORDER — ASPIRIN EC 81 MG PO TBEC
81.0000 mg | DELAYED_RELEASE_TABLET | Freq: Every day | ORAL | Status: DC
  Administered 2020-10-30: 81 mg via ORAL
  Filled 2020-10-29: qty 1

## 2020-10-29 MED ORDER — ALUM & MAG HYDROXIDE-SIMETH 200-200-20 MG/5ML PO SUSP
30.0000 mL | ORAL | Status: DC | PRN
  Administered 2020-10-29: 30 mL via ORAL
  Filled 2020-10-29 (×2): qty 30

## 2020-10-29 MED ORDER — TRAMADOL HCL 50 MG PO TABS: 50.0000 mg | ORAL_TABLET | Freq: Four times a day (QID) | ORAL | Status: DC | PRN

## 2020-10-29 MED ORDER — SENNOSIDES-DOCUSATE SODIUM 8.6-50 MG PO TABS
2.0000 | ORAL_TABLET | Freq: Every day | ORAL | Status: DC
  Filled 2020-10-29 (×2): qty 2

## 2020-10-29 MED ORDER — IOHEXOL 350 MG/ML SOLN
80.0000 mL | Freq: Once | INTRAVENOUS | Status: AC | PRN
  Administered 2020-10-29: 80 mL via INTRAVENOUS

## 2020-10-29 MED ORDER — CLOPIDOGREL BISULFATE 75 MG PO TABS: 75.0000 mg | ORAL_TABLET | Freq: Every day | ORAL | Status: DC

## 2020-10-29 MED ORDER — INSULIN ASPART 100 UNIT/ML ~~LOC~~ SOLN: 0.0000 [IU] | Freq: Every day | SUBCUTANEOUS | Status: DC

## 2020-10-29 MED ORDER — AMITRIPTYLINE HCL 10 MG PO TABS
10.0000 mg | ORAL_TABLET | Freq: Every day | ORAL | Status: DC
  Administered 2020-10-29: 10 mg via ORAL
  Filled 2020-10-29 (×2): qty 1

## 2020-10-29 MED ORDER — ASPIRIN EC 325 MG PO TBEC
325.0000 mg | DELAYED_RELEASE_TABLET | Freq: Every day | ORAL | Status: DC
  Filled 2020-10-29: qty 1

## 2020-10-29 MED ORDER — STROKE: EARLY STAGES OF RECOVERY BOOK
Freq: Once | Status: AC
  Filled 2020-10-29 (×2): qty 1

## 2020-10-29 MED ORDER — ACETAMINOPHEN 325 MG PO TABS
650.0000 mg | ORAL_TABLET | ORAL | Status: DC | PRN
  Administered 2020-10-29 (×2): 650 mg via ORAL
  Filled 2020-10-29 (×2): qty 2

## 2020-10-29 MED ORDER — LEVOTHYROXINE SODIUM 50 MCG PO TABS
50.0000 ug | ORAL_TABLET | Freq: Every day | ORAL | Status: DC
  Administered 2020-10-30: 50 ug via ORAL
  Filled 2020-10-29: qty 1

## 2020-10-29 MED ORDER — CALCIUM CARBONATE ANTACID 500 MG PO CHEW
1.0000 | CHEWABLE_TABLET | Freq: Three times a day (TID) | ORAL | Status: AC | PRN
  Administered 2020-10-29: 200 mg via ORAL
  Filled 2020-10-29: qty 1

## 2020-10-29 NOTE — Assessment & Plan Note (Signed)
-   on repatha at home

## 2020-10-29 NOTE — ED Notes (Signed)
This RN walked into pt's room to check on pt and found pt taking taking tylenol that she brought from her home. Alcario Drought, MD has been notified.

## 2020-10-29 NOTE — ED Notes (Signed)
Patient states she took her synthroid at 6:30am along with 99mg  potassium pill and 500mg  tylenol.  Patient also states she took her amitriptyline 10mg  last night.   I encouraged her to not take her home medications and let us give her medications going forward. Pt is agreeable at this time.

## 2020-10-29 NOTE — Assessment & Plan Note (Signed)
-  continue synthroid 

## 2020-10-29 NOTE — Progress Notes (Signed)
Carotid artery duplex has been completed. Preliminary results can be found in CV Proc through chart review.   10/29/20 12:02 PM [name] RVT

## 2020-10-29 NOTE — Progress Notes (Signed)
  Echocardiogram 2D Echocardiogram has been performed.  [name] 10/29/2020, 12:10 PM

## 2020-10-29 NOTE — H&P (Signed)
History and Physical    [name] MLY:[id number] DOB: 1951-11-25 DOA: 10/28/2020  PCP: Hulan Fess, MD  Patient coming from: Home  I have personally briefly reviewed patient's old medical records in [place]  Chief Complaint: Facial droop, code stroke  HPI: [name] is a 69 y.o. 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  Pt presents to the ED as a code stroke with c/o sensation of "swelling" in her left face.  Thinks it started around 1pm.  EMS called and pt noted to have facial droop and slurred speech with difficulty repeating phrases.  Pt brought to ED.  No CP, no SOB, no fever.   ED Course: Symptoms rapidly resolved in ED, now symptom free.  MRI brain neg for acute stroke.  Neurologist recs admit for TIA work up.  MRA head neg for intracranial large vessel stenosis.  No MRA neck obtained.   Review of Systems: As per HPI, otherwise all review of systems negative.  Past Medical History:  Diagnosis Date  . Allergy   . Anemia   . Arthritis   . Asthma    Dianosed years ago-no meds at this time  . Cancer [facility])    endometrial   . Cataract    Right eye removed-still has left cataract   . Chronic kidney disease 1997   Nepthrotic syndrom with minimal change-in remission per pt - last seen by kidney MD- 2 mos ago ? name of MD   . Diabetes mellitus    type 2   . Dyspnea    with exertion   . Dysrhythmia    "skips beats"  . Family history of adverse reaction to anesthesia    son stopped breathing during surgery , mother slow to wake up   . Fibromyalgia 1980's  . Generalized anxiety disorder   . GERD (gastroesophageal reflux disease)    occ- will use Tums and Prolosec  prn   . Heart murmur   . Hyperlipemia   . Hypertension    not on blood pressure meds due to allergies - last dose of meds- months ago   . Hypothyroidism   . Irritable bowel syndrome  1980's  . Major depressive disorder   . Migraine headaches 1980's   On meds-well controlled  . 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  . Morbid obesity ([place])   . Pneumonia 2009   Several times over the past several years  . PONV (postoperative nausea and vomiting)    was told by neurologist due to calcificaton in brain not to be put to sleep, N/V during Op and Recovery   . Recurrent upper respiratory infection (URI)   . Sleep apnea    no cpap  . Subarachnoid hemorrhage ([place]) 2010  . Vaginal polyp    benign per pt    Past Surgical History:  Procedure Laterality Date  . CHOLECYSTECTOMY  1983  . COLONOSCOPY  2008   DB  . [place] OF UTERUS  1972  . HYSTEROSCOPY WITH D & C  06/29/2011   Procedure: DILATATION AND CURETTAGE (D&C) /HYSTEROSCOPY;  Surgeon: Donnamae Jude, MD;  Location: [facility];  Service: Gynecology;  Laterality: N/A;  . ROBOTIC ASSISTED TOTAL HYSTERECTOMY WITH BILATERAL SALPINGO OOPHERECTOMY N/A 10/27/2020   Procedure: XI ROBOTIC ASSISTED TOTAL HYSTERECTOMY WITH BILATERAL SALPINGO OOPHORECTOMY;  Surgeon: Everitt Amber, MD;  Location: [facility];  Service: Gynecology;  Laterality: N/A;  .  SENTINEL NODE BIOPSY N/A 10/27/2020   Procedure: SENTINEL NODE BIOPSY;  Surgeon: Everitt Amber, MD;  Location: [facility];  Service: Gynecology;  Laterality: N/A;     reports that she has never smoked. She has never used smokeless tobacco. She reports that she does not drink alcohol and does not use drugs.  Allergies  Allergen Reactions  . Acarbose   . Atenolol     Stomach problems, chills  . Cinnamon     Stomach pain  . Glimepiride     ALL DIABETIC MEDS PER PT   . Invokana [Canagliflozin]     ALL DIABETIC MEDS PER PT   . Januvia [Sitagliptin]     ALL DIABETIC MEDS PER PT   . Losartan Potassium   . Metformin And Related Nausea And Vomiting and Other (See Comments)    Patient states that she has severe chills, headache and cramping additionally. Says blood sugar is  uncontrolled because of this  . Other Other (See Comments)    ALL DIABETIC MEDS PER PT   . Pioglitazone Other (See Comments)    ALL DIABETIC MEDS PER PT   . Statins     Myalgia  . Sulfamethoxazole Other (See Comments)    ALL DIABETIC MEDS PER PT   . Sulfa Antibiotics Rash and Other (See Comments)    Mother has told patient in the past not to take, she cannot recall there reaction    Family History  Problem Relation Age of Onset  . Liver cancer Father        mets to liver   . Bladder Cancer Father   . Colon cancer Father        mets to colon   . Anesthesia problems Mother        hard to wake post op   . Cerebrovascular Disease Mother        Never had stroke, but had CEA.  . High blood pressure Other   . Diabetes Other   . Arthritis Other   . Anesthesia problems Son        stopped breathing post op   . Tremor Son   . Schizophrenia Daughter        Schizoaffective disorder  . Anesthesia problems Granddaughter        PONV  . Dementia Neg Hx   . Esophageal cancer Neg Hx   . Rectal cancer Neg Hx   . Stomach cancer Neg Hx   . Colon polyps Neg Hx      Prior to Admission medications   Medication Sig Start Date End Date Taking? Authorizing Provider  acetaminophen (TYLENOL) 500 MG tablet Take 500 mg by mouth every 6 (six) hours as needed for moderate pain.    [provider]  amitriptyline (ELAVIL) 10 MG tablet Take 10 mg by mouth at bedtime.  08/24/20   [provider]  aspirin EC 81 MG tablet Take 1 tablet (81 mg total) by mouth daily. Swallow whole. 06/10/20   Jettie Booze, MD  bumetanide (BUMEX) 1 MG tablet Take 1 mg by mouth every morning. 02/05/20   [provider]  cyanocobalamin (,VITAMIN B-12,) 1000 MCG/ML injection Inject 1,000 mcg into the muscle every 30 (thirty) days.     [provider]  Evolocumab (REPATHA SURECLICK) 017 MG/ML SOAJ Inject 1 pen into the skin every 14 (fourteen) days. 05/11/20   Jettie Booze, MD   levothyroxine (SYNTHROID, LEVOTHROID) 50 MCG tablet Take 50 mcg by mouth daily.  [provider]  pantoprazole (PROTONIX) 40 MG tablet Take 1 tablet (40 mg total) by mouth daily. 06/10/20   Mauri Pole, MD  Potassium 99 MG TABS Take 198 mg by mouth daily.    [provider]  senna-docusate (SENOKOT-S) 8.6-50 MG tablet Take 2 tablets by mouth at bedtime. For AFTER surgery, do not take if having diarrhea 10/17/20   Cross, Lenna Sciara D, NP  traMADol (ULTRAM) 50 MG tablet Take 1 tablet (50 mg total) by mouth every 6 (six) hours as needed for severe pain. For AFTER surgery, do not take and drive 62/5/63   Joylene John D, NP    Physical Exam: Vitals:   10/29/20 0030 10/29/20 0045 10/29/20 0100 10/29/20 0130  BP: 135/64 (!) 113/56 114/90 125/60  Pulse: 85 87 86 87  Resp: 17 18 19 17   Temp:      TempSrc:      SpO2: 99% 99% 100% 99%  Weight:      Height:        Constitutional: NAD, calm, comfortable Eyes: PERRL, lids and conjunctivae normal ENMT: Mucous membranes are moist. Posterior pharynx clear of any exudate or lesions.Normal dentition.  Neck: normal, supple, no masses, no thyromegaly Respiratory: clear to auscultation bilaterally, no wheezing, no crackles. Normal respiratory effort. No accessory muscle use.  Cardiovascular: Regular rate and rhythm, no murmurs / rubs / gallops. No extremity edema. 2+ pedal pulses. No carotid bruits.  Abdomen: Incisions without surrounding erythema, edema, drainage Musculoskeletal: no clubbing / cyanosis. No joint deformity upper and lower extremities. Good ROM, no contractures. Normal muscle tone.  Skin: no rashes, lesions, ulcers. No induration Neurologic: CN 2-12 grossly intact. Sensation intact, DTR normal. Strength 5/5 in all 4.  Psychiatric: Normal judgment and insight. Alert and oriented x 3. Normal mood.    Labs on Admission: I have personally reviewed following labs and imaging studies  CBC: Recent Labs  Lab  10/28/20 2105 10/28/20 2112  WBC 5.1  --   NEUTROABS 2.9  --   HGB 12.6 12.6  HCT 37.9 37.0  MCV 92.7  --   PLT 212  --    Basic Metabolic Panel: Recent Labs  Lab 10/28/20 2105 10/28/20 2112  NA 139 140  K 3.9 3.8  CL 100 98  CO2 28  --   GLUCOSE 207* 200*  BUN 15 17  CREATININE 1.07* 1.00  CALCIUM 9.6  --    GFR: Estimated Creatinine Clearance: 50.7 mL/min (by C-G formula based on SCr of 1 mg/dL). Liver Function Tests: Recent Labs  Lab 10/28/20 2105  AST 19  ALT 14  ALKPHOS 54  BILITOT 0.9  PROT 6.7  ALBUMIN 3.8   No results for input(s): LIPASE, AMYLASE in the last 168 hours. No results for input(s): AMMONIA in the last 168 hours. Coagulation Profile: Recent Labs  Lab 10/28/20 2105  INR 0.9   Cardiac Enzymes: No results for input(s): CKTOTAL, CKMB, CKMBINDEX, TROPONINI in the last 168 hours. BNP (last 3 results) No results for input(s): PROBNP in the last 8760 hours. HbA1C: No results for input(s): HGBA1C in the last 72 hours. CBG: Recent Labs  Lab 10/27/20 0626 10/27/20 0922 10/27/20 1030 10/27/20 1237 10/28/20 2058  GLUCAP 227* 177* 157* 253* 178*   Lipid Profile: No results for input(s): CHOL, HDL, LDLCALC, TRIG, CHOLHDL, LDLDIRECT in the last 72 hours. Thyroid Function Tests: No results for input(s): TSH, T4TOTAL, FREET4, T3FREE, THYROIDAB in the last 72 hours. Anemia Panel: No results for input(s): VITAMINB12,  FOLATE, FERRITIN, TIBC, IRON, RETICCTPCT in the last 72 hours. Urine analysis:    Component Value Date/Time   COLORURINE YELLOW 10/18/2020 [place] 10/18/2020 1354   LABSPEC 1.011 10/18/2020 1354   PHURINE 6.0 10/18/2020 1354   GLUCOSEU 50 (A) 10/18/2020 1354   HGBUR SMALL (A) 10/18/2020 1354   BILIRUBINUR NEGATIVE 10/18/2020 1354   KETONESUR NEGATIVE 10/18/2020 1354   PROTEINUR NEGATIVE 10/18/2020 1354   UROBILINOGEN 0.2 05/15/2011 1809   NITRITE NEGATIVE 10/18/2020 1354   LEUKOCYTESUR MODERATE (A)  10/18/2020 1354    Radiological Exams on Admission: DG Chest 2 View  Result Date: 10/29/2020 CLINICAL DATA:  Chest pain, postoperative day 1 from hysterectomy EXAM: CHEST - 2 VIEW COMPARISON:  None. FINDINGS: There is free air beneath the right hemidiaphragm and interstitial gas within the a abdominal wall noted on lateral examination in keeping with the given history of recent intra-abdominal surgery. Lungs are clear. No pneumothorax or pleural effusion. Cardiac size within normal limits. Pulmonary vascularity is normal. No acute bone abnormality. IMPRESSION: No active cardiopulmonary disease. Free air within the upper abdomen, likely postsurgical in nature. Electronically Signed   By: Fidela [name] MD   On: 10/29/2020 01:33   MR ANGIO HEAD WO CONTRAST  Result Date: 10/28/2020 CLINICAL DATA:  Stroke follow-up EXAM: MRI HEAD WITHOUT CONTRAST MRA HEAD WITHOUT CONTRAST TECHNIQUE: Multiplanar, multiecho pulse sequences of the brain and surrounding structures were obtained without intravenous contrast. Angiographic images of the head were obtained using MRA technique without contrast. COMPARISON:  None. FINDINGS: MRI HEAD FINDINGS Brain: No acute infarct, acute hemorrhage or extra-axial collection. Multifocal hyperintense T2-weighted signal within the white matter. Mild generalized volume loss. Mineralization within the basal ganglia and dentate nuclei. Chronic microhemorrhage in the right frontal white matter. Normal midline structures. Vascular: Normal flow voids. Skull and upper cervical spine: The bone marrow signal of the cranium and upper cervical vertebrae is normal. There is no skull base lesion. The visualized upper cervical spinal cord is normal. Sinuses/Orbits: There is no paranasal sinus fluid level or advanced mucosal thickening. There is no mastoid or middle ear effusion. The orbits are normal. Other: None MRA HEAD FINDINGS POSTERIOR CIRCULATION: --Vertebral arteries: Normal --Inferior  cerebellar arteries: Normal. --Basilar artery: Normal. --Superior cerebellar arteries: Normal. --Posterior cerebral arteries: Normal. ANTERIOR CIRCULATION: --Intracranial internal carotid arteries: Normal. --Anterior cerebral arteries (ACA): Normal. --Middle cerebral arteries (MCA): Normal. ANATOMIC VARIANTS: None IMPRESSION: 1. No acute intracranial abnormality. 2. Normal intracranial MRA. 3. Chronic small vessel disease and volume loss. Electronically Signed   By: Ulyses Jarred M.D.   On: 10/28/2020 23:38   MR BRAIN WO CONTRAST  Result Date: 10/28/2020 CLINICAL DATA:  Stroke follow-up EXAM: MRI HEAD WITHOUT CONTRAST MRA HEAD WITHOUT CONTRAST TECHNIQUE: Multiplanar, multiecho pulse sequences of the brain and surrounding structures were obtained without intravenous contrast. Angiographic images of the head were obtained using MRA technique without contrast. COMPARISON:  None. FINDINGS: MRI HEAD FINDINGS Brain: No acute infarct, acute hemorrhage or extra-axial collection. Multifocal hyperintense T2-weighted signal within the white matter. Mild generalized volume loss. Mineralization within the basal ganglia and dentate nuclei. Chronic microhemorrhage in the right frontal white matter. Normal midline structures. Vascular: Normal flow voids. Skull and upper cervical spine: The bone marrow signal of the cranium and upper cervical vertebrae is normal. There is no skull base lesion. The visualized upper cervical spinal cord is normal. Sinuses/Orbits: There is no paranasal sinus fluid level or advanced mucosal thickening. There is no mastoid or middle ear  effusion. The orbits are normal. Other: None MRA HEAD FINDINGS POSTERIOR CIRCULATION: --Vertebral arteries: Normal --Inferior cerebellar arteries: Normal. --Basilar artery: Normal. --Superior cerebellar arteries: Normal. --Posterior cerebral arteries: Normal. ANTERIOR CIRCULATION: --Intracranial internal carotid arteries: Normal. --Anterior cerebral arteries (ACA):  Normal. --Middle cerebral arteries (MCA): Normal. ANATOMIC VARIANTS: None IMPRESSION: 1. No acute intracranial abnormality. 2. Normal intracranial MRA. 3. Chronic small vessel disease and volume loss. Electronically Signed   By: Ulyses Jarred M.D.   On: 10/28/2020 23:38   CT HEAD CODE STROKE WO CONTRAST  Result Date: 10/28/2020 CLINICAL DATA:  Code stroke. Neuro deficit, acute, stroke suspected. Left facial droop. EXAM: CT HEAD WITHOUT CONTRAST TECHNIQUE: Contiguous axial images were obtained from the base of the skull through the vertex without intravenous contrast. COMPARISON:  Head CT and MRI 02/26/2020 FINDINGS: Brain: There is no evidence of an acute infarct, intracranial hemorrhage, mass, midline shift, or extra-axial fluid collection. Mild cerebral atrophy is within normal limits for age. Extensive symmetric calcification in the lentiform and dentate nuclei bilaterally is unchanged. Hypodensities in the cerebral white matter bilaterally are unchanged and nonspecific but compatible with mild chronic small vessel ischemic disease. Vascular: Calcified atherosclerosis at the skull base. No suspicious acute arterial hyperdensity. Skull: No fracture or suspicious osseous lesion. Sinuses/Orbits: Visualized paranasal sinuses and mastoid air cells are clear. Right cataract extraction. Other: None. ASPECTS [facility] Stroke Program Early CT Score) - Ganglionic level infarction (caudate, lentiform nuclei, internal capsule, insula, M1-M3 cortex): 7 - Supraganglionic infarction (M4-M6 cortex): 3 Total score (0-10 with 10 being normal): 10 IMPRESSION: 1. No evidence of acute intracranial abnormality. ASPECTS is 10. 2. Mild chronic small vessel ischemic disease. 3. 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 These results were called by telephone at the time of interpretation on 10/28/2020 at 9:20 pm to Dr. Lorrin Goodell, who verbally acknowledged these results.  Electronically Signed   By: Logan Bores M.D.   On: 10/28/2020 21:24    EKG: Independently reviewed.  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 TIA - 1. DDx includes Bell's Palsy, but im not used to seeing Bell's Palsy resolve this fast. 2. See neuro consult note 3. TIA pathway 4. Tele monitor 5. Neuro checks 6. 2d echo 7. Carotid dopplers 8. PT/OT/SLP 9. Was going to start ASA as per neuro recs - but her surgeon wants her to hold this post op for the moment. 2. DM2 - 1. Sensitive SSI AC/HS for now 2. Check A1C 3. HLD - 1. Check FLP 2. Pt on Repatha 4. Endometrial CA - 1. POD #2 of TAH/BSO 2. Incisions look fine 3. Cont home tramadol for pain control 4. Cont tylenol PRN 5. Cont Sennakot-S 6. Add PRN miralax 5. 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 F/u with neurology  DVT prophylaxis: SCDs Code Status: Full Family Communication: No family in room Disposition Plan: Home after TIA work up [employer] called: Neuro Admission status: Place in obs    Rod Majerus, [facility]  How to contact the [facility] Attending or Consulting provider [place] or covering provider during after hours [place], for this patient?  1. Check the care team in [facility] and look for a) attending/consulting [facility] provider listed and b) the [facility] team listed 2. Log into [email or web address]  Amion Physician Scheduling and  messaging for groups and whole hospitals  On call and physician scheduling software for group practices, residents, hospitalists and other medical providers for call, clinic, rotation and shift schedules. OnCall [employer] is a hospital-wide system for scheduling doctors and paging doctors on call. EasyPlot is for scientific plotting and data analysis.  [email or web address]  and use [place]'s universal password to access. If you do not  have the password, please contact the hospital operator.  3. Locate the [facility] provider you are looking for under [facility] and page to a number that you can be directly reached. 4. If you still have difficulty reaching the provider, please page the [facility] (Director on Call) for the Hospitalists listed on amion for assistance.  10/29/2020, 1:47 AM

## 2020-10-29 NOTE — Progress Notes (Signed)
Gyn Onc Note  Have followed along virtually with patient's hospital course to date. Patient brought in for observation of left facial droop 28 hours postop from robotic hysterectomy, BSO and pelvic sentinel lymph node biopsy for endometrial cancer. Being worked up for TIA (acute CVA ruled out).   Her Hb is stable and there is no sign of active bleeding at present from her surgery.  Understand the need for anti-platelet therapy given underlying vascular disease. - 81mg  ASA is acceptable, however, would not treat with 325mg  ASA in the first 7 days after her surgery which included a retroperitoneal dissection. - prophylactic dose DVT prophylaxis acceptable (with monitoring of creatinine to ensure dosing is not supratherapeutic).  Please call with questions  Cell: [place] Sanaa Zilberman, MD
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
## 2020-10-29 NOTE — Assessment & Plan Note (Addendum)
-   etiology ambiguous at this time; CVA ruled out with normal MRI although still could be DWI negative MRI? - Remaining differential includes TIA vs ?Bells (less likely given fast resolution of sxms) vs related to Fahr disease? (follows outpatient with neuro). She also has a lot of underlying anxiety/depression and has been on a few different meds in the past. She was recently put on Wellbutrin but she is no longer taking it (I suspect due to not much improvement, but she may have not given it long enough of a chance nor increased dose) - continue asa 81 daily for now - follow up neuro eval, appreciate assistance. Discussed DAPT with neuro and Dr. Denman George. For now, neuro is okay with monotherapy aspirin 81 mg daily until patient is at least 1 week post-op.  - On 11/18 patient to start asa/plavix for 3 weeks then plavix alone - she had no further droop, etc after initial episode on admission

## 2020-10-29 NOTE — Progress Notes (Signed)
PROGRESS NOTE    [name]   OVF:[id number]  DOB: 15-Nov-1951  DOA: 10/28/2020     0  PCP: Hulan Fess, MD  CC: left facial droop  Hospital Course: [name] is a 69 yo female with PMH endometrial CA s/p TAH/BSO and retroperitoneal node dissection, DMII, HTN, CAD, ?Fahr disease who presented to the ER after complaints of "swelling" in her left eye, itching that has been going on for months, and apparently a left facial droop and slurred speech.   There was some concern for forehead involvement but unclear and symptoms also seem to have resolved rather quickly.  She was started on aspirin on admission and underwent stroke workup.  CT head revealed chronic calcifications in the basal ganglia and dentate nuclei.  Mild chronic small vascular disease noted.  No acute abnormalities. MRI brain was negative for acute abnormalities as well.  MRA head also normal.    Interval History:  Seen in ER still awaiting room. Her facial droop and eye swelling resolved soon after eval in the ER. She has no lingering symptoms at this time.   Old records reviewed in assessment of this patient  ROS: Constitutional: negative for chills and fevers, Respiratory: negative for cough, Cardiovascular: negative for chest pain and Gastrointestinal: negative for abdominal pain  Assessment & Plan: * Facial droop - etiology ambiguous at this time; CVA ruled out with normal MRI. Remaining differential includes TIA vs ?Bells' vs related to Fahr disease? 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  HLD (hyperlipidemia) - on repatha at home   Endometrial cancer [facility]) - s/p TAH, BSO, retroperitoneal node dissection - appreciate assistance with Dr. Denman George. Okay for asa 81 and/or DVT ppx if needed  - lap incisions again  inspected today; healing well, no signs infection - outpatient care   Hypothyroidism - continue synthroid  Diabetes mellitus type II, non insulin dependent (HCC) - A1c 7.2% - continue SSI and CBGs    Antimicrobials: none  DVT prophylaxis: SCD Code Status: Full Family Communication: none present Disposition Plan: Status is: Observation  The patient remains OBS appropriate and will d/c before 2 midnights.  Dispo: The patient is from: Home              Anticipated d/c is to: Home              Anticipated d/c date is: 1 day              Patient currently is not medically stable to d/c.  Objective: Blood pressure (!) 170/88, pulse 93, temperature 98.5 F (36.9 C), temperature source Oral, resp. rate 16, height 5\' 2"  (1.575 m), weight 80.7 kg, SpO2 100 %.  Examination: General appearance: alert, cooperative and no distress Head: Normocephalic, without obvious abnormality, atraumatic Eyes: EOMI Lungs: clear to auscultation bilaterally Heart: regular rate and rhythm and S1, S2 normal Abdomen: obese, soft, NT, ND, 5 lap incisions healing well, BS present Extremities: no edema Skin: mobility and turgor normal Neurologic: No focal deficits. No nystagmus. PERRLA/EOMI. No tremor noted   Consultants:   neuro  Procedures:     Data Reviewed: I have personally reviewed following labs and imaging studies Results for orders placed or performed during the hospital encounter of 10/28/20 (from the past 24 hour(s))  CBG monitoring, ED  Status: Abnormal   Collection Time: 10/28/20  8:58 PM  Result Value Ref Range   Glucose-Capillary 178 (H) 70 - 99 mg/dL  Protime-INR     Status: None   Collection Time: 10/28/20  9:05 PM  Result Value Ref Range   Prothrombin Time 12.0 11.4 - 15.2 seconds   INR 0.9 0.8 - 1.2  APTT     Status: None   Collection Time: 10/28/20  9:05 PM  Result Value Ref Range   aPTT 27 24 - 36 seconds  CBC     Status: None   Collection Time: 10/28/20  9:05 PM   Result Value Ref Range   WBC 5.1 4.0 - 10.5 K/uL   RBC 4.09 3.87 - 5.11 MIL/uL   Hemoglobin 12.6 12.0 - 15.0 g/dL   HCT 37.9 36 - 46 %   MCV 92.7 80.0 - 100.0 fL   MCH 30.8 26.0 - 34.0 pg   MCHC 33.2 30.0 - 36.0 g/dL   RDW 13.6 11.5 - 15.5 %   Platelets 212 150 - 400 K/uL   nRBC 0.0 0.0 - 0.2 %  Differential     Status: None   Collection Time: 10/28/20  9:05 PM  Result Value Ref Range   Neutrophils Relative % 58 %   Neutro Abs 2.9 1.7 - 7.7 K/uL   Lymphocytes Relative 28 %   Lymphs Abs 1.4 0.7 - 4.0 K/uL   Monocytes Relative 12 %   Monocytes Absolute 0.6 0.1 - 1.0 K/uL   Eosinophils Relative 2 %   Eosinophils Absolute 0.1 0.0 - 0.5 K/uL   Basophils Relative 0 %   Basophils Absolute 0.0 0.0 - 0.1 K/uL   Immature Granulocytes 0 %   Abs Immature Granulocytes 0.01 0.00 - 0.07 K/uL  Comprehensive metabolic panel     Status: Abnormal   Collection Time: 10/28/20  9:05 PM  Result Value Ref Range   Sodium 139 135 - 145 mmol/L   Potassium 3.9 3.5 - 5.1 mmol/L   Chloride 100 98 - 111 mmol/L   CO2 28 22 - 32 mmol/L   Glucose, Bld 207 (H) 70 - 99 mg/dL   BUN 15 8 - 23 mg/dL   Creatinine, Ser 1.07 (H) 0.44 - 1.00 mg/dL   Calcium 9.6 8.9 - 10.3 mg/dL   Total Protein 6.7 6.5 - 8.1 g/dL   Albumin 3.8 3.5 - 5.0 g/dL   AST 19 15 - 41 U/L   ALT 14 0 - 44 U/L   Alkaline Phosphatase 54 38 - 126 U/L   Total Bilirubin 0.9 0.3 - 1.2 mg/dL   GFR, Estimated 56 (L) >60 mL/min   Anion gap 11 5 - 15  I-stat chem 8, ED     Status: Abnormal   Collection Time: 10/28/20  9:12 PM  Result Value Ref Range   Sodium 140 135 - 145 mmol/L   Potassium 3.8 3.5 - 5.1 mmol/L   Chloride 98 98 - 111 mmol/L   BUN 17 8 - 23 mg/dL   Creatinine, Ser 1.00 0.44 - 1.00 mg/dL   Glucose, Bld 200 (H) 70 - 99 mg/dL   Calcium, Ion 1.19 1.15 - 1.40 mmol/L   TCO2 27 22 - 32 mmol/L   Hemoglobin 12.6 12.0 - 15.0 g/dL   HCT 37.0 36 - 46 %  Respiratory Panel by RT PCR (Flu A&B, Covid) - Nasopharyngeal Swab     Status:  None   Collection Time: 10/28/20 10:16 PM   Specimen:  Nasopharyngeal Swab  Result Value Ref Range   SARS Coronavirus 2 by RT PCR NEGATIVE NEGATIVE   Influenza A by PCR NEGATIVE NEGATIVE   Influenza B by PCR NEGATIVE NEGATIVE  CBG monitoring, ED     Status: Abnormal   Collection Time: 10/29/20  2:43 AM  Result Value Ref Range   Glucose-Capillary 192 (H) 70 - 99 mg/dL  Hemoglobin A1c     Status: Abnormal   Collection Time: 10/29/20  2:44 AM  Result Value Ref Range   Hgb A1c MFr Bld 7.2 (H) 4.8 - 5.6 %   Mean Plasma Glucose 159.94 mg/dL  Lipid panel     Status: Abnormal   Collection Time: 10/29/20  2:44 AM  Result Value Ref Range   Cholesterol 101 0 - 200 mg/dL   Triglycerides 137 <150 mg/dL   HDL 33 (L) >40 mg/dL   Total CHOL/HDL Ratio 3.1 RATIO   VLDL 27 0 - 40 mg/dL   LDL Cholesterol 41 0 - 99 mg/dL  CBG monitoring, ED     Status: Abnormal   Collection Time: 10/29/20  7:52 AM  Result Value Ref Range   Glucose-Capillary 191 (H) 70 - 99 mg/dL  CBG monitoring, ED     Status: Abnormal   Collection Time: 10/29/20  9:05 AM  Result Value Ref Range   Glucose-Capillary 178 (H) 70 - 99 mg/dL  CBG monitoring, ED     Status: Abnormal   Collection Time: 10/29/20 12:19 PM  Result Value Ref Range   Glucose-Capillary 162 (H) 70 - 99 mg/dL  CBG monitoring, ED     Status: Abnormal   Collection Time: 10/29/20  1:36 PM  Result Value Ref Range   Glucose-Capillary 190 (H) 70 - 99 mg/dL    Recent Results (from the past 240 hour(s))  SARS CORONAVIRUS 2 (TAT 6-24 HRS) Nasopharyngeal Nasopharyngeal Swab     Status: None   Collection Time: 10/24/20  9:45 AM   Specimen: Nasopharyngeal Swab  Result Value Ref Range Status   SARS Coronavirus 2 NEGATIVE NEGATIVE Final    Comment: (NOTE) SARS-CoV-2 target nucleic acids are NOT DETECTED.  The SARS-CoV-2 RNA is generally detectable in upper and lower respiratory specimens during the acute phase of infection. Negative results do not preclude  SARS-CoV-2 infection, do not rule out co-infections with other pathogens, and should not be used as the sole basis for treatment or other patient management decisions. Negative results must be combined with clinical observations, patient history, and epidemiological information. The expected result is Negative.  Fact Sheet for Patients: SugarRoll.be  Fact Sheet for Healthcare Providers: https://www.woods-mathews.com/  This test is not yet approved or cleared by the [place] FDA and  has been authorized for detection and/or diagnosis of SARS-CoV-2 by FDA under an Emergency Use Authorization (EUA). This EUA will remain  in effect (meaning this test can be used) for the duration of the COVID-19 declaration under Se ction 564(b)(1) of the Act, 21 U.S.C. section 360bbb-3(b)(1), unless the authorization is terminated or revoked sooner.  Performed at [facility], [place] [address] [place], [place] [address]   Respiratory Panel by RT PCR (Flu A&B, Covid) - Nasopharyngeal Swab     Status: None   Collection Time: 10/28/20 10:16 PM   Specimen: Nasopharyngeal Swab  Result Value Ref Range Status   SARS Coronavirus 2 by RT PCR NEGATIVE NEGATIVE Final    Comment: (NOTE) SARS-CoV-2 target nucleic acids are NOT DETECTED.  The SARS-CoV-2 RNA is generally detectable  in upper respiratoy specimens during the acute phase of infection. The lowest concentration of SARS-CoV-2 viral copies this assay can detect is 131 copies/mL. A negative result does not preclude SARS-Cov-2 infection and should not be used as the sole basis for treatment or other patient management decisions. A negative result may occur with  improper specimen collection/handling, submission of specimen other than nasopharyngeal swab, presence of viral mutation(s) within the areas targeted by this assay, and inadequate number of viral copies (<131 copies/mL). A negative result must be  combined with clinical observations, patient history, and epidemiological information. The expected result is Negative.  Fact Sheet for Patients:  PinkCheek.be  Fact Sheet for Healthcare Providers:  GravelBags.it  This test is no t yet approved or cleared by the [place] FDA and  has been authorized for detection and/or diagnosis of SARS-CoV-2 by FDA under an Emergency Use Authorization (EUA). This EUA will remain  in effect (meaning this test can be used) for the duration of the COVID-19 declaration under Section 564(b)(1) of the Act, 21 U.S.C. section 360bbb-3(b)(1), unless the authorization is terminated or revoked sooner.     Influenza A by PCR NEGATIVE NEGATIVE Final   Influenza B by PCR NEGATIVE NEGATIVE Final    Comment: (NOTE) The Xpert Xpress SARS-CoV-2/FLU/RSV assay is intended as an aid in  the diagnosis of influenza from Nasopharyngeal swab specimens and  should not be used as a sole basis for treatment. Nasal washings and  aspirates are unacceptable for Xpert Xpress SARS-CoV-2/FLU/RSV  testing.  Fact Sheet for Patients: PinkCheek.be  Fact Sheet for Healthcare Providers: GravelBags.it  This test is not yet approved or cleared by the [place] FDA and  has been authorized for detection and/or diagnosis of SARS-CoV-2 by  FDA under an Emergency Use Authorization (EUA). This EUA will remain  in effect (meaning this test can be used) for the duration of the  Covid-19 declaration under Section 564(b)(1) of the Act, 21  U.S.C. section 360bbb-3(b)(1), unless the authorization is  terminated or revoked. Performed at [facility], [place] [address] [place],  [address]      Radiology Studies: DG Chest 2 View  Result Date: 10/29/2020 CLINICAL DATA:  Chest pain, postoperative day 1 from hysterectomy EXAM: CHEST - 2 VIEW COMPARISON:   None. FINDINGS: There is free air beneath the right hemidiaphragm and interstitial gas within the a abdominal wall noted on lateral examination in keeping with the given history of recent intra-abdominal surgery. Lungs are clear. No pneumothorax or pleural effusion. Cardiac size within normal limits. Pulmonary vascularity is normal. No acute bone abnormality. IMPRESSION: No active cardiopulmonary disease. Free air within the upper abdomen, likely postsurgical in nature. Electronically Signed   By: Fidela [name] MD   On: 10/29/2020 01:33   MR ANGIO HEAD WO CONTRAST  Result Date: 10/28/2020 CLINICAL DATA:  Stroke follow-up EXAM: MRI HEAD WITHOUT CONTRAST MRA HEAD WITHOUT CONTRAST TECHNIQUE: Multiplanar, multiecho pulse sequences of the brain and surrounding structures were obtained without intravenous contrast. Angiographic images of the head were obtained using MRA technique without contrast. COMPARISON:  None. FINDINGS: MRI HEAD FINDINGS Brain: No acute infarct, acute hemorrhage or extra-axial collection. Multifocal hyperintense T2-weighted signal within the white matter. Mild generalized volume loss. Mineralization within the basal ganglia and dentate nuclei. Chronic microhemorrhage in the right frontal white matter. Normal midline structures. Vascular: Normal flow voids. Skull and upper cervical spine: The bone marrow signal of the cranium and upper cervical vertebrae is normal. There is  no skull base lesion. The visualized upper cervical spinal cord is normal. Sinuses/Orbits: There is no paranasal sinus fluid level or advanced mucosal thickening. There is no mastoid or middle ear effusion. The orbits are normal. Other: None MRA HEAD FINDINGS POSTERIOR CIRCULATION: --Vertebral arteries: Normal --Inferior cerebellar arteries: Normal. --Basilar artery: Normal. --Superior cerebellar arteries: Normal. --Posterior cerebral arteries: Normal. ANTERIOR CIRCULATION: --Intracranial internal carotid arteries: Normal.  --Anterior cerebral arteries (ACA): Normal. --Middle cerebral arteries (MCA): Normal. ANATOMIC VARIANTS: None IMPRESSION: 1. No acute intracranial abnormality. 2. Normal intracranial MRA. 3. Chronic small vessel disease and volume loss. Electronically Signed   By: Ulyses Jarred M.D.   On: 10/28/2020 23:38   MR BRAIN WO CONTRAST  Result Date: 10/28/2020 CLINICAL DATA:  Stroke follow-up EXAM: MRI HEAD WITHOUT CONTRAST MRA HEAD WITHOUT CONTRAST TECHNIQUE: Multiplanar, multiecho pulse sequences of the brain and surrounding structures were obtained without intravenous contrast. Angiographic images of the head were obtained using MRA technique without contrast. COMPARISON:  None. FINDINGS: MRI HEAD FINDINGS Brain: No acute infarct, acute hemorrhage or extra-axial collection. Multifocal hyperintense T2-weighted signal within the white matter. Mild generalized volume loss. Mineralization within the basal ganglia and dentate nuclei. Chronic microhemorrhage in the right frontal white matter. Normal midline structures. Vascular: Normal flow voids. Skull and upper cervical spine: The bone marrow signal of the cranium and upper cervical vertebrae is normal. There is no skull base lesion. The visualized upper cervical spinal cord is normal. Sinuses/Orbits: There is no paranasal sinus fluid level or advanced mucosal thickening. There is no mastoid or middle ear effusion. The orbits are normal. Other: None MRA HEAD FINDINGS POSTERIOR CIRCULATION: --Vertebral arteries: Normal --Inferior cerebellar arteries: Normal. --Basilar artery: Normal. --Superior cerebellar arteries: Normal. --Posterior cerebral arteries: Normal. ANTERIOR CIRCULATION: --Intracranial internal carotid arteries: Normal. --Anterior cerebral arteries (ACA): Normal. --Middle cerebral arteries (MCA): Normal. ANATOMIC VARIANTS: None IMPRESSION: 1. No acute intracranial abnormality. 2. Normal intracranial MRA. 3. Chronic small vessel disease and volume loss.  Electronically Signed   By: Ulyses Jarred M.D.   On: 10/28/2020 23:38   ECHOCARDIOGRAM COMPLETE  Result Date: 10/29/2020    ECHOCARDIOGRAM REPORT   Patient Name:   Lorita Officer [employer] Date of Exam: 10/29/2020 Medical Rec #:  [id number]             Height:       62.0 in Accession #:    [id number]            Weight:       178.0 lb Date of Birth:  [birth year]            BSA:          1.819 m Patient Age:    12 years              BP:           134/75 mmHg Patient Gender: F                     HR:           87 bpm. Exam Location:  Inpatient Procedure: 2D Echo, Cardiac Doppler and Color Doppler Indications:    TIA  History:        Patient has prior history of Echocardiogram examinations, most                 recent 11/27/2019. CAD, TIA; Risk Factors:Diabetes, Hypertension                 and  Dyslipidemia.  Sonographer:    Dustin Flock Referring Phys: [place]  1. Left ventricular ejection fraction, by estimation, is 60 to 65%. The left ventricle has normal function. The left ventricle has no regional wall motion abnormalities. Left ventricular diastolic parameters are consistent with Grade I diastolic dysfunction (impaired relaxation). Elevated left atrial pressure.  2. Right ventricular systolic function is normal. The right ventricular size is normal. There is moderately elevated pulmonary artery systolic pressure. The estimated right ventricular systolic pressure is 94.1 mmHg.  3. Left atrial size was mildly dilated.  4. The mitral valve is normal in structure. Mild mitral valve regurgitation. Mild mitral stenosis. The mean mitral valve gradient is 5.5 mmHg with average heart rate of 85 bpm. Moderate to severe mitral annular calcification.  5. The aortic valve is normal in structure. Aortic valve regurgitation is not visualized. Mild aortic valve sclerosis is present, with no evidence of aortic valve stenosis.  6. The inferior vena cava is normal in size with <50% respiratory  variability, suggesting right atrial pressure of 8 mmHg. FINDINGS  Left Ventricle: Left ventricular ejection fraction, by estimation, is 60 to 65%. The left ventricle has normal function. The left ventricle has no regional wall motion abnormalities. The left ventricular internal cavity size was normal in size. There is  no left ventricular hypertrophy. Left ventricular diastolic parameters are consistent with Grade I diastolic dysfunction (impaired relaxation). Elevated left atrial pressure. Right Ventricle: The right ventricular size is normal. No increase in right ventricular wall thickness. Right ventricular systolic function is normal. There is moderately elevated pulmonary artery systolic pressure. The tricuspid regurgitant velocity is 3.39 m/s, and with an assumed right atrial pressure of 8 mmHg, the estimated right ventricular systolic pressure is 74.0 mmHg. Left Atrium: Left atrial size was mildly dilated. Right Atrium: Right atrial size was normal in size. Pericardium: There is no evidence of pericardial effusion. Mitral Valve: The mitral valve is normal in structure. Moderate to severe mitral annular calcification. Mild mitral valve regurgitation. Mild mitral valve stenosis. The mean mitral valve gradient is 5.5 mmHg with average heart rate of 85 bpm. Tricuspid Valve: The tricuspid valve is normal in structure. Tricuspid valve regurgitation is trivial. No evidence of tricuspid stenosis. Aortic Valve: The aortic valve is normal in structure. Aortic valve regurgitation is not visualized. Mild aortic valve sclerosis is present, with no evidence of aortic valve stenosis. Pulmonic Valve: The pulmonic valve was normal in structure. Pulmonic valve regurgitation is not visualized. No evidence of pulmonic stenosis. Aorta: The aortic root is normal in size and structure. Venous: The inferior vena cava is normal in size with less than 50% respiratory variability, suggesting right atrial pressure of 8 mmHg.  IAS/Shunts: No atrial level shunt detected by color flow Doppler.  LEFT VENTRICLE PLAX 2D LVIDd:         4.00 cm  Diastology LVIDs:         2.40 cm  LV e' medial:    5.66 cm/s LV PW:         1.00 cm  LV E/e' medial:  18.2 LV IVS:        0.90 cm  LV e' lateral:   5.11 cm/s LVOT diam:     2.00 cm  LV E/e' lateral: 20.2 LV SV:         77 LV SV Index:   42 LVOT Area:     3.14 cm  RIGHT VENTRICLE RV Basal diam:  3.90 cm RV S  prime:     19.30 cm/s TAPSE (M-mode): 3.3 cm LEFT ATRIUM             Index       RIGHT ATRIUM           Index LA diam:        4.10 cm 2.25 cm/m  RA Area:     18.30 cm LA Vol (A2C):   57.5 ml 31.61 ml/m RA Volume:   50.10 ml  27.54 ml/m LA Vol (A4C):   45.5 ml 25.01 ml/m LA Biplane Vol: 54.1 ml 29.74 ml/m  AORTIC VALVE LVOT Vmax:   110.00 cm/s LVOT Vmean:  72.700 cm/s LVOT VTI:    0.246 m  AORTA Ao Root diam: 2.90 cm MITRAL VALVE                TRICUSPID VALVE MV Area (PHT): 2.16 cm     TR Peak grad:   46.0 mmHg MV Mean grad:  5.5 mmHg     TR Vmax:        339.00 cm/s MV PHT:        105.17 msec MV Decel Time: 351 msec     SHUNTS MV E velocity: 103.00 cm/s  Systemic VTI:  0.25 m MV A velocity: 153.00 cm/s  Systemic Diam: 2.00 cm MV E/A ratio:  0.67 Mihai Croitoru MD Electronically signed by Sanda Klein MD Signature Date/Time: 10/29/2020/12:47:01 PM    Final    CT HEAD CODE STROKE WO CONTRAST  Result Date: 10/28/2020 CLINICAL DATA:  Code stroke. Neuro deficit, acute, stroke suspected. Left facial droop. EXAM: CT HEAD WITHOUT CONTRAST TECHNIQUE: Contiguous axial images were obtained from the base of the skull through the vertex without intravenous contrast. COMPARISON:  Head CT and MRI 02/26/2020 FINDINGS: Brain: There is no evidence of an acute infarct, intracranial hemorrhage, mass, midline shift, or extra-axial fluid collection. Mild cerebral atrophy is within normal limits for age. Extensive symmetric calcification in the lentiform and dentate nuclei bilaterally is unchanged.  Hypodensities in the cerebral white matter bilaterally are unchanged and nonspecific but compatible with mild chronic small vessel ischemic disease. Vascular: Calcified atherosclerosis at the skull base. No suspicious acute arterial hyperdensity. Skull: No fracture or suspicious osseous lesion. Sinuses/Orbits: Visualized paranasal sinuses and mastoid air cells are clear. Right cataract extraction. Other: None. ASPECTS [facility] Stroke Program Early CT Score) - Ganglionic level infarction (caudate, lentiform nuclei, internal capsule, insula, M1-M3 cortex): 7 - Supraganglionic infarction (M4-M6 cortex): 3 Total score (0-10 with 10 being normal): 10 IMPRESSION: 1. No evidence of acute intracranial abnormality. ASPECTS is 10. 2. Mild chronic small vessel ischemic disease. 3. 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 These results were called by telephone at the time of interpretation on 10/28/2020 at 9:20 pm to Dr. Lorrin Goodell, who verbally acknowledged these results. Electronically Signed   By: Logan Bores M.D.   On: 10/28/2020 21:24   VAS US CAROTID (at [facility] and [facility] only)  Result Date: 10/29/2020 Carotid Arterial Duplex Study Indications:       TIA. Risk Factors:      Diabetes. Comparison Study:  No prior studies. Performing Technologist: Oliver Hum RVT  Examination Guidelines: A complete evaluation includes B-mode imaging, spectral Doppler, color Doppler, and power Doppler as needed of all accessible portions of each vessel. Bilateral testing is considered an integral part of a complete examination. Limited examinations for reoccurring indications may be performed as noted.  Right  Carotid Findings: +----------+--------+--------+--------+-----------------------+--------+           PSV cm/sEDV cm/sStenosisPlaque Description     Comments +----------+--------+--------+--------+-----------------------+--------+ CCA Prox  90      11                                      tortuous +----------+--------+--------+--------+-----------------------+--------+ CCA Distal67      16              smooth and heterogenous         +----------+--------+--------+--------+-----------------------+--------+ ICA Prox  162     46      40-59%  calcific                        +----------+--------+--------+--------+-----------------------+--------+ ICA Mid   113     15                                              +----------+--------+--------+--------+-----------------------+--------+ ICA Distal74      23                                              +----------+--------+--------+--------+-----------------------+--------+ ECA       82      10                                              +----------+--------+--------+--------+-----------------------+--------+ +----------+--------+-------+--------+-------------------+           PSV cm/sEDV cmsDescribeArm Pressure (mmHG) +----------+--------+-------+--------+-------------------+ [id number]                                         +----------+--------+-------+--------+-------------------+ +---------+--------+--+--------+--+---------+ VertebralPSV cm/s68EDV cm/s20Antegrade +---------+--------+--+--------+--+---------+  Left Carotid Findings: +----------+--------+--------+--------+-----------------------+--------+           PSV cm/sEDV cm/sStenosisPlaque Description     Comments +----------+--------+--------+--------+-----------------------+--------+ CCA Prox  75      13              smooth and heterogenoustortuous +----------+--------+--------+--------+-----------------------+--------+ CCA Distal75      20              smooth and heterogenous         +----------+--------+--------+--------+-----------------------+--------+ ICA Prox  49      16                                              +----------+--------+--------+--------+-----------------------+--------+  ICA Distal128     32                                     tortuous +----------+--------+--------+--------+-----------------------+--------+ ECA       71      7                                               +----------+--------+--------+--------+-----------------------+--------+ +----------+--------+--------+--------+-------------------+  PSV cm/sEDV cm/sDescribeArm Pressure (mmHG) +----------+--------+--------+--------+-------------------+ Subclavian128                                         +----------+--------+--------+--------+-------------------+ +---------+--------+--+--------+--+---------+ VertebralPSV cm/s88EDV cm/s14Antegrade +---------+--------+--+--------+--+---------+   Summary: Right Carotid: Velocities in the right ICA are consistent with a 40-59%                stenosis. Left Carotid: Velocities in the left ICA are consistent with a 1-39% stenosis. Vertebrals: Bilateral vertebral arteries demonstrate antegrade flow. *See table(s) above for measurements and observations.     Preliminary    VAS US CAROTID (at [facility] and [facility] only)      DG Chest 2 View  Final Result    MR BRAIN WO CONTRAST  Final Result    MR ANGIO HEAD WO CONTRAST  Final Result    CT HEAD CODE STROKE WO CONTRAST  Final Result      Scheduled Meds: . acetaminophen  650 mg Oral Once  . amitriptyline  10 mg Oral QHS  . [START ON 10/30/2020] aspirin EC  81 mg Oral Daily  . bumetanide  1 mg Oral q morning - 10a  . insulin aspart  0-5 Units Subcutaneous QHS  . insulin aspart  0-9 Units Subcutaneous TID WC  . levothyroxine  50 mcg Oral Q0600  . pantoprazole  40 mg Oral Daily  . senna-docusate  2 tablet Oral QHS  . sodium chloride flush  3 mL Intravenous Once   PRN Meds: acetaminophen **OR** acetaminophen (TYLENOL) oral liquid 160 mg/5 mL **OR** acetaminophen, polyethylene glycol, traMADol Continuous Infusions:   LOS: 0 days  Time spent: Greater than 50% of the 35 minute visit  was spent in counseling/coordination of care for the patient as laid out in the A&P.   Dwyane Dee, MD [facility] 10/29/2020, 5:29 PM

## 2020-10-29 NOTE — Assessment & Plan Note (Signed)
-   follow up neurology evaluation - possibly contributing to presenting symptoms?
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
## 2020-10-29 NOTE — ED Notes (Signed)
Per Lorrin Goodell, MD q4 modified NIHSS.

## 2020-10-29 NOTE — Assessment & Plan Note (Addendum)
-   s/p TAH, BSO, retroperitoneal node dissection - appreciate assistance with Dr. Denman George. Okay for asa 81 and/or DVT ppx if needed  - lap incisions inspected; healing well, no signs infection - outpatient care

## 2020-10-29 NOTE — Progress Notes (Addendum)
STROKE TEAM PROGRESS NOTE   INTERVAL HISTORY No family is at the bedside.  Patient stated that she had endometrial cancer surgery Thursday, for reach she was put in upside down position.  Yesterday she felt left eye swollen, left face swollen, left face numbness and left arm and leg numbness.  Currently, she still complaining of left face, arm and leg decreased light touch sensation.  MRI negative for acute infarct.  Carotid Doppler right ICA 40 to 59% stenosis.   OBJECTIVE Vitals:   10/29/20 1345 10/29/20 1500 10/29/20 1503 10/29/20 1504  BP:  129/61    Pulse: 88 82    Resp: 18 (!) 21    Temp:   98 F (36.7 C) 98 F (36.7 C)  TempSrc:   Oral Oral  SpO2: 98% 98%    Weight:      Height:        CBC:  Recent Labs  Lab 10/28/20 2105 10/28/20 2112  WBC 5.1  --   NEUTROABS 2.9  --   HGB 12.6 12.6  HCT 37.9 37.0  MCV 92.7  --   PLT 212  --     Basic Metabolic Panel:  Recent Labs  Lab 10/28/20 2105 10/28/20 2112  NA 139 140  K 3.9 3.8  CL 100 98  CO2 28  --   GLUCOSE 207* 200*  BUN 15 17  CREATININE 1.07* 1.00  CALCIUM 9.6  --     Lipid Panel:     Component Value Date/Time   CHOL 101 10/29/2020 0244   CHOL 132 09/02/2020 1456   TRIG 137 10/29/2020 0244   HDL 33 (L) 10/29/2020 0244   HDL 51 09/02/2020 1456   CHOLHDL 3.1 10/29/2020 0244   VLDL 27 10/29/2020 0244   LDLCALC 41 10/29/2020 0244   LDLCALC 56 09/02/2020 1456   HgbA1c:  Lab Results  Component Value Date   HGBA1C 7.2 (H) 10/29/2020   Urine Drug Screen:     Component Value Date/Time   LABOPIA NONE DETECTED 02/26/2020 0239   COCAINSCRNUR NONE DETECTED 02/26/2020 0239   LABBENZ NONE DETECTED 02/26/2020 0239   AMPHETMU NONE DETECTED 02/26/2020 0239   THCU NONE DETECTED 02/26/2020 0239   LABBARB NONE DETECTED 02/26/2020 0239    Alcohol Level     Component Value Date/Time   ETH <10 02/25/2020 2339    IMAGING  MR BRAIN WO CONTRAST MR ANGIO HEAD WO CONTRAST 10/28/2020 IMPRESSION:  1. No  acute intracranial abnormality.  2. Normal intracranial MRA.  3. Chronic small vessel disease and volume loss.   CT HEAD CODE STROKE WO CONTRAST 10/28/2020 IMPRESSION:  1. No evidence of acute intracranial abnormality. ASPECTS is 10.  2. Mild chronic small vessel ischemic disease.  3. 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   ECHOCARDIOGRAM COMPLETE 10/29/2020 IMPRESSIONS   1. Left ventricular ejection fraction, by estimation, is 60 to 65%. The left ventricle has normal function. The left ventricle has no regional wall motion abnormalities. Left ventricular diastolic parameters are consistent with Grade I diastolic dysfunction (impaired relaxation). Elevated left atrial pressure.   2. Right ventricular systolic function is normal. The right ventricular size is normal. There is moderately elevated pulmonary artery systolic pressure. The estimated right ventricular systolic pressure is 56.3 mmHg.   3. Left atrial size was mildly dilated.   4. The mitral valve is normal in structure. Mild mitral valve regurgitation. Mild mitral stenosis. The mean mitral valve gradient  is 5.5 mmHg with average heart rate of 85 bpm. Moderate to severe mitral annular calcification.   5. The aortic valve is normal in structure. Aortic valve regurgitation is not visualized. Mild aortic valve sclerosis is present, with no evidence of aortic valve stenosis.   6. The inferior vena cava is normal in size with <50% respiratory variability, suggesting right atrial pressure of 8 mmHg.   DG Chest 2 View 10/29/2020 IMPRESSION:  No active cardiopulmonary disease. Free air within the upper abdomen, likely postsurgical in nature.   VAS US CAROTID (at [facility] and [facility] only) 10/29/2020 Summary:  Right Carotid: Velocities in the right ICA are consistent with a 40-59% stenosis.  Left Carotid: Velocities in the left ICA are consistent with a 1-39% stenosis.  Vertebrals:  Bilateral vertebral arteries demonstrate antegrade flow.   Transthoracic Echocardiogram  1. Left ventricular ejection fraction, by estimation, is 60 to 65%. The  left ventricle has normal function. The left ventricle has no regional  wall motion abnormalities. Left ventricular diastolic parameters are  consistent with Grade I diastolic  dysfunction (impaired relaxation). Elevated left atrial pressure.  2. Right ventricular systolic function is normal. The right ventricular  size is normal. There is moderately elevated pulmonary artery systolic  pressure. The estimated right ventricular systolic pressure is 62.9 mmHg.  3. Left atrial size was mildly dilated.  4. The mitral valve is normal in structure. Mild mitral valve  regurgitation. Mild mitral stenosis. The mean mitral valve gradient is 5.5  mmHg with average heart rate of 85 bpm. Moderate to severe mitral annular  calcification.  5. The aortic valve is normal in structure. Aortic valve regurgitation is  not visualized. Mild aortic valve sclerosis is present, with no evidence  of aortic valve stenosis.  6. The inferior vena cava is normal in size with <50% respiratory  variability, suggesting right atrial pressure of 8 mmHg.    ECG - SR rate 92 BPM. (See cardiology reading for complete details)   PHYSICAL EXAM  Temp:  [97.9 F (36.6 C)-98.5 F (36.9 C)] 97.9 F (36.6 C) (11/13 2019) Pulse Rate:  [78-95] 84 (11/13 2019) Resp:  [10-31] 18 (11/13 2019) BP: (104-170)/(47-110) 139/74 (11/13 2019) SpO2:  [94 %-100 %] 97 % (11/13 2019) Weight:  [80.7 kg] 80.7 kg (11/12 2156)  General - Well nourished, well developed, in no apparent distress.  Ophthalmologic - fundi not visualized due to noncooperation.  Cardiovascular - Regular rhythm and rate.  Mental Status -  Level of arousal and orientation to time, place, and person were intact. Language including expression, naming, repetition, comprehension was assessed and  found intact. Fund of Knowledge was assessed and was intact.  Cranial Nerves II - XII - II - Visual field intact OU. III, IV, VI - Extraocular movements intact. V - Facial sensation decreased on the left. VII - Facial movement intact bilaterally VIII - Hearing & vestibular intact bilaterally. X - Palate elevates symmetrically. XI - Chin turning & shoulder shrug intact bilaterally. XII - Tongue protrusion intact.  Motor Strength - The patient's strength was normal in all extremities and pronator drift was absent.  Bulk was normal and fasciculations were absent.   Motor Tone - Muscle tone was assessed at the neck and appendages and was normal.  Reflexes - The patient's reflexes were symmetrical in all extremities and she had no pathological reflexes.  Sensory - Light touch, temperature/pinprick were assessed and were decreased light touch sensation on the left.    Coordination - The  patient had normal movements in the hands with no ataxia or dysmetria.  Tremor was absent.  Gait and Station - deferred.   ASSESSMENT/PLAN [name] is a 69 y.o. female with history of  CKD, DM2, GAD, GERD, HLD, Migraines, dysrhythmia (skips beats), prior SAH and robotic assisted total hysterectomy 10/27/2020 for grade 1 endometrial cancer who presents with a few hours of sensation of swelling in the left face, mild L facial droop and slurred speech with difficulty with repeating phrases. She did not receive IV t-PA due to late presentation (>4.5 hours from time of onset)  TIA vs. DWI negative stroke    Resultant left mild paresthesia  CT Head - No evidence of acute intracranial abnormality. ASPECTS is 10. Mild chronic small vessel ischemic disease. Chronic calcification in the basal ganglia and dentate nuclei which can be seen with Fahr disease  MRI head - No acute intracranial abnormality.  MRA head - Normal intracranial MRA.   CTA neck pending  Carotid Doppler - Velocities in the  right ICA are consistent with a 40-59% stenosis.   2D Echo - EF 60 - 65%. No cardiac source of emboli identified.   Sars Corona Virus 2 - negative  LDL - 41  HgbA1c - 7.2  VTE prophylaxis - SCDs  aspirin 81 mg daily prior to admission, now on aspirin 81 mg daily and plavix 75 DAPT.   Patient counseled to be compliant with her antithrombotic medications  Ongoing aggressive stroke risk factor management  Therapy recommendations:  HH PT - Outpt OT  Disposition:  Pending  Hyperlipidemia  Home Lipid lowering medication: Repatha  LDL 41, goal < 70  Current lipid lowering medication: none   Continue repatha at discharge  Diabetes  Home diabetic meds: none  Current diabetic meds: SSI   HgbA1c 7.2, goal < 7.0  CBG monitoring  PCP follow-up  Other Stroke Risk Factors  Advanced age  Obesity, Body mass index is 32.56 kg/m., recommend weight loss, diet and exercise as appropriate   Migraines   OSA  Other Active Problems Code status - Full code  Endometrial cancer status post surgery last Thursday.  Hospital day # 0  Rosalin Hawking, MD PhD Stroke Neurology 10/29/2020 8:38 PM    To contact Stroke Continuity provider, please refer to [email or web address] After hours, contact General Neurology

## 2020-10-29 NOTE — Hospital Course (Addendum)
[name] is a 69 yo female with PMH endometrial CA s/p TAH/BSO and retroperitoneal node dissection, DMII, HTN, CAD, ?Fahr disease who presented to the ER after complaints of "swelling" in her left eye, itching that has been going on for months, and apparently a left facial droop and slurred speech.   There was some concern for forehead involvement but unclear and symptoms also seem to have resolved rather quickly.  She was started on aspirin on admission and underwent stroke workup.  CT head revealed chronic calcifications in the basal ganglia and dentate nuclei.  Mild chronic small vascular disease noted.  No acute abnormalities. MRI brain was negative for acute abnormalities as well.  MRA head also normal. Carotid u/s showed R ICA 40-59% and L ICA 1-39% stenosis.  CTA neck: 75% stenosis of R ICA Echo: EF 60-65%, Gr 1 DD.

## 2020-10-29 NOTE — Assessment & Plan Note (Signed)
-   A1c 7.2% - continue SSI and CBGs

## 2020-10-30 DIAGNOSIS — F419 Anxiety disorder, unspecified: Secondary | ICD-10-CM

## 2020-10-30 DIAGNOSIS — I6529 Occlusion and stenosis of unspecified carotid artery: Secondary | ICD-10-CM

## 2020-10-30 DIAGNOSIS — I6521 Occlusion and stenosis of right carotid artery: Secondary | ICD-10-CM

## 2020-10-30 DIAGNOSIS — R29810 Facial weakness: Secondary | ICD-10-CM | POA: Diagnosis not present

## 2020-10-30 DIAGNOSIS — F32A Depression, unspecified: Secondary | ICD-10-CM

## 2020-10-30 DIAGNOSIS — G459 Transient cerebral ischemic attack, unspecified: Secondary | ICD-10-CM | POA: Diagnosis not present

## 2020-10-30 LAB — GLUCOSE, CAPILLARY
Glucose-Capillary: 203 mg/dL — ABNORMAL HIGH (ref 70–99)
Glucose-Capillary: 177 mg/dL — ABNORMAL HIGH (ref 70–99)
Glucose-Capillary: 190 mg/dL — ABNORMAL HIGH (ref 70–99)

## 2020-10-30 LAB — BASIC METABOLIC PANEL
Anion gap: 10 (ref 5–15)
BUN: 9 mg/dL (ref 8–23)
CO2: 29 mmol/L (ref 22–32)
Calcium: 9.3 mg/dL (ref 8.9–10.3)
Chloride: 100 mmol/L (ref 98–111)
Creatinine, Ser: 0.92 mg/dL (ref 0.44–1.00)
GFR, Estimated: >60 mL/min (ref >60)
Glucose, Bld: 196 mg/dL — ABNORMAL HIGH (ref 70–99)
Potassium: 3.6 mmol/L (ref 3.5–5.1)
Sodium: 139 mmol/L (ref 135–145)

## 2020-10-30 LAB — CBC WITH DIFFERENTIAL/PLATELET
Abs Immature Granulocytes: 0.01 10*3/uL (ref 0.00–0.07)
Basophils Absolute: 0 10*3/uL (ref 0.0–0.1)
Basophils Relative: 1 %
Eosinophils Absolute: 0.2 10*3/uL (ref 0.0–0.5)
Eosinophils Relative: 4 %
HCT: 36 % (ref 36.0–46.0)
Hemoglobin: 12 g/dL (ref 12.0–15.0)
Immature Granulocytes: 0 %
Lymphocytes Relative: 32 %
Lymphs Abs: 1.2 10*3/uL (ref 0.7–4.0)
MCH: 30.1 pg (ref 26.0–34.0)
MCHC: 33.3 g/dL (ref 30.0–36.0)
MCV: 90.2 fL (ref 80.0–100.0)
Monocytes Absolute: 0.5 10*3/uL (ref 0.1–1.0)
Monocytes Relative: 13 %
Neutro Abs: 1.9 10*3/uL (ref 1.7–7.7)
Neutrophils Relative %: 50 %
Platelets: 206 10*3/uL (ref 150–400)
RBC: 3.99 MIL/uL (ref 3.87–5.11)
RDW: 13.3 % (ref 11.5–15.5)
WBC: 3.7 10*3/uL — ABNORMAL LOW (ref 4.0–10.5)
nRBC: 0 % (ref 0.0–0.2)

## 2020-10-30 LAB — MAGNESIUM: Magnesium: 1.5 mg/dL — ABNORMAL LOW (ref 1.7–2.4)

## 2020-10-30 MED ORDER — BUPROPION HCL ER (XL) 150 MG PO TB24
150.0000 mg | ORAL_TABLET | Freq: Every day | ORAL | 3 refills | Status: DC
Start: 2020-10-31 — End: 2021-02-10

## 2020-10-30 MED ORDER — CLOPIDOGREL BISULFATE 75 MG PO TABS: 75.0000 mg | ORAL_TABLET | Freq: Every day | ORAL | 5 refills | Status: DC

## 2020-10-30 MED ORDER — MAGNESIUM CITRATE PO SOLN
1.0000 | Freq: Every day | ORAL | Status: DC | PRN
  Administered 2020-10-30: 1 via ORAL
  Filled 2020-10-30: qty 296

## 2020-10-30 MED ORDER — BUPROPION HCL ER (XL) 150 MG PO TB24
150.0000 mg | ORAL_TABLET | Freq: Every day | ORAL | Status: DC
  Administered 2020-10-30: 150 mg via ORAL
  Filled 2020-10-30: qty 1

## 2020-10-30 MED ORDER — SIMETHICONE 80 MG PO CHEW
80.0000 mg | CHEWABLE_TABLET | Freq: Four times a day (QID) | ORAL | Status: DC | PRN
  Administered 2020-10-30: 80 mg via ORAL
  Filled 2020-10-30 (×2): qty 1

## 2020-10-30 MED ORDER — HYDROXYZINE HCL 25 MG PO TABS: 25.0000 mg | ORAL_TABLET | Freq: Three times a day (TID) | ORAL | Status: DC | PRN

## 2020-10-30 MED ORDER — DICYCLOMINE HCL 10 MG PO CAPS
10.0000 mg | ORAL_CAPSULE | Freq: Three times a day (TID) | ORAL | Status: DC | PRN
  Administered 2020-10-30: 10 mg via ORAL
  Filled 2020-10-30: qty 1

## 2020-10-30 MED ORDER — POLYETHYLENE GLYCOL 3350 17 G PO PACK
17.0000 g | PACK | Freq: Every day | ORAL | Status: DC
  Administered 2020-10-30: 17 g via ORAL
  Filled 2020-10-30: qty 1

## 2020-10-30 MED ORDER — HYDROXYZINE HCL 10 MG PO TABS: 10.0000 mg | ORAL_TABLET | Freq: Three times a day (TID) | ORAL | 0 refills | Status: DC | PRN

## 2020-10-30 NOTE — Progress Notes (Signed)
Pt cardiac leads fell off. Leads replaced by nurse tech

## 2020-10-30 NOTE — Discharge Instructions (Signed)
Take aspirin 81 mg daily. On 11/18 start taking aspirin 81 mg daily AND plavix 75 mg daily. After 3 weeks, stop taking aspirin and continue on Plavix 75 mg daily.   1. Cope with anxiety and de-stress yourself 2. Check BP at home regularly, and BP goal 130-150, avoid low BP (< 110).  3. Continue the medication prescribed 4. Follow up with Dr. Oneida Alar and Dr. Jaynee Eagles as outpatient.   Transient Ischemic Attack  A transient ischemic attack (TIA) is a "warning stroke" that causes stroke-like symptoms that go away quickly. A TIA does not cause lasting damage to the brain. But having a TIA is a sign that you may be at risk for a stroke. Lifestyle changes and medical treatments can help prevent a stroke. It is important to know the symptoms of a TIA and what to do. Get help right away, even if your symptoms go away. The symptoms of a TIA are the same as those of a stroke. They can happen fast, and they usually go away within minutes or hours. They can include:  Weakness or loss of feeling in your face, arm, or leg. This often happens on one side of your body.  Trouble walking.  Trouble moving your arms or legs.  Trouble talking or understanding what people are saying.  Trouble seeing.  Seeing two of one object (double vision).  Feeling dizzy.  Feeling confused.  Loss of balance or coordination.  Feeling sick to your stomach (nauseous) and throwing up (vomiting).  A very bad headache for no reason. What increases the risk? Certain things may make you more likely to have a TIA. Some of these are things that you can change, such as:  Being very overweight (obese).  Using products that contain nicotine or tobacco, such as cigarettes and e-cigarettes.  Taking birth control pills.  Not being active.  Drinking too much alcohol.  Using drugs. Other risk factors include:  Having an irregular heartbeat (atrial fibrillation).  Being African American or Hispanic.  Having had blood  clots, stroke, TIA, or heart attack in the past.  Being a woman with a history of high blood pressure in pregnancy (preeclampsia).  Being over the age of 78.  Being female.  Having family history of stroke.  Having the following diseases or conditions: ? High blood pressure. ? High cholesterol. ? Diabetes. ? Heart disease. ? Sickle cell disease. ? Sleep apnea. ? Migraine headache. ? Long-term (chronic) diseases that cause soreness and swelling (inflammation). ? Disorders that affect how your blood clots. Follow these instructions at home: Medicines   Take over-the-counter and prescription medicines only as told by your doctor.  If you were told to take aspirin or another medicine to thin your blood, take it exactly as told by your doctor. ? Taking too much of the medicine can cause bleeding. ? Taking too little of the medicine may not work to treat the problem. Eating and drinking   Eat 5 or more servings of fruits and vegetables each day.  Follow instructions from your doctor about your diet. You may need to follow a certain diet to help lower your risk of having a stroke. You may need to: ? Eat a diet that is low in fat and salt. ? Eat foods that contain a lot of fiber. ? Limit the amount of carbohydrates and sugar in your diet.  Limit alcohol intake to 1 drink a day for nonpregnant women and 2 drinks a day for men. One drink equals  12 oz of beer, 5 oz of wine, or 1 oz of hard liquor. General instructions  Keep a healthy weight.  Stay active. Try to get at least 30 minutes of activity on all or most days.  Find out if you have a condition called sleep apnea. Get treatment if needed.  Do not use any products that contain nicotine or tobacco, such as cigarettes and e-cigarettes. If you need help quitting, ask your doctor.  Do not abuse drugs.  Keep all follow-up visits as told by your doctor. This is important. Get help right away if:  You have any signs of  stroke. "BE FAST" is an easy way to remember the main warning signs: ? B - Balance. Signs are dizziness, sudden trouble walking, or loss of balance. ? E - Eyes. Signs are trouble seeing or a sudden change in how you see. ? F - Face. Signs are sudden weakness or loss of feeling of the face, or the face or eyelid drooping on one side. ? A - Arms. Signs are weakness or loss of feeling in an arm. This happens suddenly and usually on one side of the body. ? S - Speech. Signs are sudden trouble speaking, slurred speech, or trouble understanding what people say. ? T - Time. Time to call emergency services. Write down what time symptoms started.  You have other signs of stroke, such as: ? A sudden, very bad headache with no known cause. ? Feeling sick to your stomach (nausea). ? Throwing up (vomiting). ? Jerky movements that you cannot control (seizure). These symptoms may be an emergency. Do not wait to see if the symptoms will go away. Get medical help right away. Call your local emergency services (911 in the [place] Do not drive yourself to the hospital. Summary  A transient ischemic attack (TIA) is a "warning stroke" that causes stroke-like symptoms that go away quickly.  A TIA is a medical emergency. Get help right away, even if your symptoms go away.  A TIA does not cause lasting damage to the brain.  Having a TIA is a sign that you may be at risk for a stroke. Lifestyle changes and medical treatments can help prevent a stroke. This information is not intended to replace advice given to you by your health care provider. Make sure you discuss any questions you have with your health care provider. Document Revised: 08/29/2018 Document Reviewed: 03/06/2017 Elsevier Patient Education  [place].

## 2020-10-30 NOTE — Evaluation (Signed)
Speech Language Pathology Evaluation Patient Details Name: [name] MRN: [id number] DOB: [birth year] Today's Date: 10/30/2020 Time: 3790-2409 SLP Time Calculation (min) (ACUTE ONLY): 28 min  Problem List:  Patient Active Problem List   Diagnosis Date Noted  . Facial droop 10/29/2020  . 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 HLD (hyperlipidemia) 10/29/2020  . Endometrial cancer ([place]) 10/18/2020  . CAD (coronary artery disease) 05/11/2020  . Acute encephalopathy 02/26/2020  . Hypothyroidism   . 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  . Essential tremor 06/24/2018  . Migraine without aura 06/24/2018  . Stress at home 08/28/2015  . Post-menopausal bleeding 06/16/2011  . Kidney disease 06/16/2011  . Colon polyp 06/16/2011  . Vaginal polyp 06/16/2011  . Chronic diarrhea 06/16/2011  . Diverticulitis 06/16/2011  . DUB (dysfunctional uterine bleeding) 06/16/2011  . Fatty liver 06/16/2011  . Diabetes mellitus type II, non insulin dependent ([place]) 06/16/2011   Past Medical History:  Past Medical History:  Diagnosis Date  . Allergy   . Anemia   . Arthritis   . Asthma    Dianosed years ago-no meds at this time  . Cancer [facility])    endometrial   . Cataract    Right eye removed-still has left cataract   . Chronic kidney disease 1997   Nepthrotic syndrom with minimal change-in remission per pt - last seen by kidney MD- 2 mos ago ? name of MD   . Diabetes mellitus    type 2   . Dyspnea    with exertion   . Dysrhythmia    "skips beats"  . Family history of adverse reaction to anesthesia    son stopped breathing during surgery , mother slow to wake up   . Fibromyalgia 1980's  . Generalized anxiety disorder   . GERD (gastroesophageal reflux disease)    occ- will use Tums and Prolosec  prn   . Heart murmur   . Hyperlipemia   . Hypertension    not on blood pressure meds due to allergies - last dose of meds- months ago   . Hypothyroidism   . Irritable bowel syndrome  1980's  . Major depressive disorder   . Migraine headaches 1980's   On meds-well controlled  . 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  . Morbid obesity ([place])   . Pneumonia 2009   Several times over the past several years  . PONV (postoperative nausea and vomiting)    was told by neurologist due to calcificaton in brain not to be put to sleep, N/V during Op and Recovery   . Recurrent upper respiratory infection (URI)   . Sleep apnea    no cpap  . Subarachnoid hemorrhage ([place]) 2010  . Vaginal polyp    benign per pt   Past Surgical History:  Past Surgical History:  Procedure Laterality Date  . CHOLECYSTECTOMY  1983  . COLONOSCOPY  2008   DB  . [place] OF UTERUS  1972  . HYSTEROSCOPY WITH D & C  06/29/2011   Procedure: DILATATION AND CURETTAGE (D&C) /HYSTEROSCOPY;  Surgeon: Donnamae Jude, MD;  Location: [facility];  Service: Gynecology;  Laterality: N/A;  . ROBOTIC ASSISTED TOTAL HYSTERECTOMY WITH BILATERAL SALPINGO OOPHERECTOMY N/A 10/27/2020   Procedure: XI ROBOTIC ASSISTED TOTAL HYSTERECTOMY WITH BILATERAL SALPINGO OOPHORECTOMY;  Surgeon: Everitt Amber, MD;  Location: [facility];  Service: Gynecology;  Laterality: N/A;  . SENTINEL NODE BIOPSY N/A 10/27/2020   Procedure: SENTINEL NODE BIOPSY;  Surgeon: Everitt Amber, MD;  Location: [facility];  Service: Gynecology;  Laterality: N/A;   HPI:  69 y.o. came to ED with Mild Lt facial droop and slurred speech with repeating phrases.  MRI shows no acute intracranial abnormality, Normal intracranial MRA.  S/p recent hystrectomy due to cervical CA, h/o [facility] 2010 resulting in memory deficits, PNS, mild neurocognitive disorder, migraine HA, major depressive disorder, IBS, HTN, generalized anxiety disorder, fibromyalgia, DM.     Assessment / Plan / Recommendation Clinical Impression  Pt was seen for a cognitive-linguistic evaluation and she presents with deficits in short-term memory and sustained attention.  Pt reported that this was baseline  from prior [facility].  She completed portions of the [place] in addition to informal evaluation measures.  Mild-moderately complex numeric problem solving appeared to be within functional limits; however would recommend assistance with IALDs (including medication and financial management) at time of discharge.  Pt verbalized understanding of recommendations. She stated that her 98 year old granddaughter lives with her and that her children live near by, but that they would likely not be able to consistently assist her with IADLs at time of discharge.  Expressive and receptive language were grossly functional, but pt was noted to have some difficulty with complex comprehension and topic maintenance.  Her articulation at the conversational level was minimally decreased, but her speech remained >95% intelligible.   Recommend home health ST targeting cognitive deficits and assistance with IADLs at time of discharge.  SLP will f/u per POC.      SLP Assessment  SLP Recommendation/Assessment: Patient needs continued Speech Lanaguage Pathology Services SLP Visit Diagnosis: Cognitive communication deficit (R41.841)    Follow Up Recommendations  Home health SLP    Frequency and Duration min 2x/week  2 weeks      SLP Evaluation Cognition  Overall Cognitive Status: No family/caregiver present to determine baseline cognitive functioning Arousal/Alertness: Awake/alert Orientation Level: Oriented X4 Attention: Focused;Sustained Focused Attention: Appears intact Sustained Attention: Impaired Sustained Attention Impairment: Functional complex Memory: Impaired Memory Impairment: Decreased short term memory;Decreased recall of new information Decreased Short Term Memory: Verbal basic;Verbal complex;Functional complex Immediate Memory Recall: Sock;Blue;Bed Memory Recall Sock: Without Cue Memory Recall Blue: Without Cue Memory Recall Bed: With Cue Awareness: Appears intact Problem Solving: Appears  intact Safety/Judgment: Appears intact       Comprehension  Auditory Comprehension Overall Auditory Comprehension: Appears within functional limits for tasks assessed    Expression Expression Primary Mode of Expression: Verbal Verbal Expression Overall Verbal Expression: Appears within functional limits for tasks assessed Written Expression Written Expression: Not tested   Oral / Motor  Oral Motor/Sensory Function Overall Oral Motor/Sensory Function: Within functional limits Motor Speech Overall Motor Speech: Impaired Respiration: Within functional limits Phonation: Normal Resonance: Within functional limits Articulation: Impaired Level of Impairment: Conversation Intelligibility: Intelligible Motor Planning: Witnin functional limits Motor Speech Errors: Not applicable   GO                   Colin Mulders M.S., CCC-SLP Acute Rehabilitation Services Office: [phone]  Elvia Collum [facility] 10/30/2020, 9:43 AM

## 2020-10-30 NOTE — Care Management Obs Status (Signed)
[place] NOTIFICATION   Patient Details  Name: [name] MRN: [id number] Date of Birth: November 26, 1951   Medicare Observation Status Notification Given:  Yes    Carles Collet, RN 10/30/2020, 9:56 AM

## 2020-10-30 NOTE — Progress Notes (Addendum)
STROKE TEAM PROGRESS NOTE   INTERVAL HISTORY No family is at the bedside. Pt continue to complain of left sided decreased sensation. Yesterday carotid Doppler right ICA 40 to 59% stenosis. CTA neck showed right ICA 75% stenosis. Will consult VVS.  Of note, pt has been following with Dr. Jaynee Eagles for multiple complains in the past, including tremor, right hand numbness, memory changes, Fahr's disease, stress/anxiety. She was referred to [facility] but she only followed once there. She stated that she has a lot of stress currently, she is responsible for her 71 year old grandchild. She had recently hysterectomy and pending biopsy result and worries about the result, etc.   OBJECTIVE Vitals:   10/29/20 1815 10/29/20 2019 10/30/20 0032 10/30/20 0346  BP: (!) 129/110 139/74 (!) 167/86 (!) 158/80  Pulse: 90 84 95 92  Resp: 16 18 18 19   Temp: 98 F (36.7 C) 97.9 F (36.6 C) 97.6 F (36.4 C) 97.9 F (36.6 C)  TempSrc: Oral Oral Oral Oral  SpO2: 99% 97% 99% 98%  Weight:      Height:        CBC:  Recent Labs  Lab 10/28/20 2105 10/28/20 2105 10/28/20 2112 10/30/20 0341  WBC 5.1  --   --  3.7*  NEUTROABS 2.9  --   --  1.9  HGB 12.6   < > 12.6 12.0  HCT 37.9   < > 37.0 36.0  MCV 92.7  --   --  90.2  PLT 212  --   --  206   < > = values in this interval not displayed.    Basic Metabolic Panel:  Recent Labs  Lab 10/28/20 2105 10/28/20 2105 10/28/20 2112 10/30/20 0341  NA 139   < > 140 139  K 3.9   < > 3.8 3.6  CL 100   < > 98 100  CO2 28  --   --  29  GLUCOSE 207*   < > 200* 196*  BUN 15   < > 17 9  CREATININE 1.07*   < > 1.00 0.92  CALCIUM 9.6  --   --  9.3  MG  --   --   --  1.5*   < > = values in this interval not displayed.    Lipid Panel:     Component Value Date/Time   CHOL 101 10/29/2020 0244   CHOL 132 09/02/2020 1456   TRIG 137 10/29/2020 0244   HDL 33 (L) 10/29/2020 0244   HDL 51 09/02/2020 1456   CHOLHDL 3.1 10/29/2020 0244   VLDL 27 10/29/2020 0244   LDLCALC  41 10/29/2020 0244   LDLCALC 56 09/02/2020 1456   HgbA1c:  Lab Results  Component Value Date   HGBA1C 7.2 (H) 10/29/2020   Urine Drug Screen:     Component Value Date/Time   LABOPIA NONE DETECTED 02/26/2020 0239   COCAINSCRNUR NONE DETECTED 02/26/2020 0239   LABBENZ NONE DETECTED 02/26/2020 0239   AMPHETMU NONE DETECTED 02/26/2020 0239   THCU NONE DETECTED 02/26/2020 0239   LABBARB NONE DETECTED 02/26/2020 0239    Alcohol Level     Component Value Date/Time   ETH <10 02/25/2020 2339    IMAGING  MR BRAIN WO CONTRAST MR ANGIO HEAD WO CONTRAST 10/28/2020 IMPRESSION:  1. No acute intracranial abnormality.  2. Normal intracranial MRA.  3. Chronic small vessel disease and volume loss.   CT HEAD CODE STROKE WO CONTRAST 10/28/2020 IMPRESSION:  1. No evidence of acute intracranial  abnormality. ASPECTS is 10.  2. Mild chronic small vessel ischemic disease.  3. 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   ECHOCARDIOGRAM COMPLETE 10/29/2020 IMPRESSIONS   1. Left ventricular ejection fraction, by estimation, is 60 to 65%. The left ventricle has normal function. The left ventricle has no regional wall motion abnormalities. Left ventricular diastolic parameters are consistent with Grade I diastolic dysfunction (impaired relaxation). Elevated left atrial pressure.   2. Right ventricular systolic function is normal. The right ventricular size is normal. There is moderately elevated pulmonary artery systolic pressure. The estimated right ventricular systolic pressure is 40.1 mmHg.   3. Left atrial size was mildly dilated.   4. The mitral valve is normal in structure. Mild mitral valve regurgitation. Mild mitral stenosis. The mean mitral valve gradient is 5.5 mmHg with average heart rate of 85 bpm. Moderate to severe mitral annular calcification.   5. The aortic valve is normal in structure. Aortic valve regurgitation is not  visualized. Mild aortic valve sclerosis is present, with no evidence of aortic valve stenosis.   6. The inferior vena cava is normal in size with <50% respiratory variability, suggesting right atrial pressure of 8 mmHg.   DG Chest 2 View 10/29/2020 IMPRESSION:  No active cardiopulmonary disease. Free air within the upper abdomen, likely postsurgical in nature.   VAS US CAROTID (at [facility] and [facility] only) 10/29/2020 Summary:  Right Carotid: Velocities in the right ICA are consistent with a 40-59% stenosis.  Left Carotid: Velocities in the left ICA are consistent with a 1-39% stenosis.  Vertebrals: Bilateral vertebral arteries demonstrate antegrade flow.   ECG - SR rate 92 BPM. (See cardiology reading for complete details)  CT ANGIO NECK W OR WO CONTRAST  Result Date: 10/29/2020 CLINICAL DATA:  Left facial droop and slurred speech EXAM: CT ANGIOGRAPHY NECK TECHNIQUE: Multidetector CT imaging of the neck was performed using the standard protocol during bolus administration of intravenous contrast. Multiplanar CT image reconstructions and MIPs were obtained to evaluate the vascular anatomy. Carotid stenosis measurements (when applicable) are obtained utilizing NASCET criteria, using the distal internal carotid diameter as the denominator. CONTRAST:  15mL OMNIPAQUE IOHEXOL 350 MG/ML SOLN COMPARISON:  None. FINDINGS: Aortic arch: Calcific aortic atherosclerosis. Normal caliber. Normal variant branch pattern with the vertebral artery on the left arising independently of the aortic arch. Right carotid system: Atherosclerotic calcification at the carotid bifurcation causes 75% stenosis of the proximal internal carotid artery. Left carotid system: Mild calcific atherosclerosis without hemodynamically significant stenosis. Vertebral arteries: Right dominant.  Normal. Skeleton: Mild degenerative disease. Other neck: Negative Upper chest: Negative IMPRESSION: 1. 75% stenosis of the proximal right internal carotid  artery due to atherosclerotic calcification. Aortic Atherosclerosis (ICD10-I70.0). Electronically Signed   By: Ulyses Jarred M.D.   On: 10/29/2020 22:38   ECHOCARDIOGRAM COMPLETE  Result Date: 10/29/2020    ECHOCARDIOGRAM REPORT   Patient Name:   [name] [facility] Date of Exam: 10/29/2020 Medical Rec #:  [id number]             Height:       62.0 in Accession #:    [id number]            Weight:       178.0 lb Date of Birth:  06/10/1951            BSA:          1.819 m Patient Age:    35 years  BP:           134/75 mmHg Patient Gender: F                     HR:           87 bpm. Exam Location:  Inpatient Procedure: 2D Echo, Cardiac Doppler and Color Doppler Indications:    TIA  History:        Patient has prior history of Echocardiogram examinations, most                 recent 11/27/2019. CAD, TIA; Risk Factors:Diabetes, Hypertension                 and Dyslipidemia.  Sonographer:    Dustin Flock Referring Phys: [place]  1. Left ventricular ejection fraction, by estimation, is 60 to 65%. The left ventricle has normal function. The left ventricle has no regional wall motion abnormalities. Left ventricular diastolic parameters are consistent with Grade I diastolic dysfunction (impaired relaxation). Elevated left atrial pressure.  2. Right ventricular systolic function is normal. The right ventricular size is normal. There is moderately elevated pulmonary artery systolic pressure. The estimated right ventricular systolic pressure is 16.1 mmHg.  3. Left atrial size was mildly dilated.  4. The mitral valve is normal in structure. Mild mitral valve regurgitation. Mild mitral stenosis. The mean mitral valve gradient is 5.5 mmHg with average heart rate of 85 bpm. Moderate to severe mitral annular calcification.  5. The aortic valve is normal in structure. Aortic valve regurgitation is not visualized. Mild aortic valve sclerosis is present, with no evidence of aortic valve  stenosis.  6. The inferior vena cava is normal in size with <50% respiratory variability, suggesting right atrial pressure of 8 mmHg. FINDINGS  Left Ventricle: Left ventricular ejection fraction, by estimation, is 60 to 65%. The left ventricle has normal function. The left ventricle has no regional wall motion abnormalities. The left ventricular internal cavity size was normal in size. There is  no left ventricular hypertrophy. Left ventricular diastolic parameters are consistent with Grade I diastolic dysfunction (impaired relaxation). Elevated left atrial pressure. Right Ventricle: The right ventricular size is normal. No increase in right ventricular wall thickness. Right ventricular systolic function is normal. There is moderately elevated pulmonary artery systolic pressure. The tricuspid regurgitant velocity is 3.39 m/s, and with an assumed right atrial pressure of 8 mmHg, the estimated right ventricular systolic pressure is 09.6 mmHg. Left Atrium: Left atrial size was mildly dilated. Right Atrium: Right atrial size was normal in size. Pericardium: There is no evidence of pericardial effusion. Mitral Valve: The mitral valve is normal in structure. Moderate to severe mitral annular calcification. Mild mitral valve regurgitation. Mild mitral valve stenosis. The mean mitral valve gradient is 5.5 mmHg with average heart rate of 85 bpm. Tricuspid Valve: The tricuspid valve is normal in structure. Tricuspid valve regurgitation is trivial. No evidence of tricuspid stenosis. Aortic Valve: The aortic valve is normal in structure. Aortic valve regurgitation is not visualized. Mild aortic valve sclerosis is present, with no evidence of aortic valve stenosis. Pulmonic Valve: The pulmonic valve was normal in structure. Pulmonic valve regurgitation is not visualized. No evidence of pulmonic stenosis. Aorta: The aortic root is normal in size and structure. Venous: The inferior vena cava is normal in size with less than 50%  respiratory variability, suggesting right atrial pressure of 8 mmHg. IAS/Shunts: No atrial level shunt detected by color flow Doppler.  LEFT VENTRICLE PLAX 2D LVIDd:         4.00 cm  Diastology LVIDs:         2.40 cm  LV e' medial:    5.66 cm/s LV PW:         1.00 cm  LV E/e' medial:  18.2 LV IVS:        0.90 cm  LV e' lateral:   5.11 cm/s LVOT diam:     2.00 cm  LV E/e' lateral: 20.2 LV SV:         77 LV SV Index:   42 LVOT Area:     3.14 cm  RIGHT VENTRICLE RV Basal diam:  3.90 cm RV S prime:     19.30 cm/s TAPSE (M-mode): 3.3 cm LEFT ATRIUM             Index       RIGHT ATRIUM           Index LA diam:        4.10 cm 2.25 cm/m  RA Area:     18.30 cm LA Vol (A2C):   57.5 ml 31.61 ml/m RA Volume:   50.10 ml  27.54 ml/m LA Vol (A4C):   45.5 ml 25.01 ml/m LA Biplane Vol: 54.1 ml 29.74 ml/m  AORTIC VALVE LVOT Vmax:   110.00 cm/s LVOT Vmean:  72.700 cm/s LVOT VTI:    0.246 m  AORTA Ao Root diam: 2.90 cm MITRAL VALVE                TRICUSPID VALVE MV Area (PHT): 2.16 cm     TR Peak grad:   46.0 mmHg MV Mean grad:  5.5 mmHg     TR Vmax:        339.00 cm/s MV PHT:        105.17 msec MV Decel Time: 351 msec     SHUNTS MV E velocity: 103.00 cm/s  Systemic VTI:  0.25 m MV A velocity: 153.00 cm/s  Systemic Diam: 2.00 cm MV E/A ratio:  0.67 Mihai Croitoru MD Electronically signed by Sanda Klein MD Signature Date/Time: 10/29/2020/12:47:01 PM    Final      PHYSICAL EXAM  Temp:  [97.6 F (36.4 C)-98.5 F (36.9 C)] 97.9 F (36.6 C) (11/14 0346) Pulse Rate:  [78-95] 92 (11/14 0346) Resp:  [13-31] 19 (11/14 0346) BP: (112-170)/(47-110) 158/80 (11/14 0346) SpO2:  [95 %-100 %] 98 % (11/14 0346)  General - Well nourished, well developed, in no apparent distress.  Ophthalmologic - fundi not visualized due to noncooperation.  Cardiovascular - Regular rhythm and rate.  Mental Status -  Level of arousal and orientation to time, place, and person were intact. Language including expression, naming,  repetition, comprehension was assessed and found intact. Fund of Knowledge was assessed and was intact.  Cranial Nerves II - XII - II - Visual field intact OU. III, IV, VI - Extraocular movements intact. V - Facial sensation decreased on the left. VII - Facial movement intact bilaterally VIII - Hearing & vestibular intact bilaterally. X - Palate elevates symmetrically. XI - Chin turning & shoulder shrug intact bilaterally. XII - Tongue protrusion intact.  Motor Strength - The patient's strength was normal in all extremities and pronator drift was absent.  Bulk was normal and fasciculations were absent.   Motor Tone - Muscle tone was assessed at the neck and appendages and was normal.  Reflexes - The patient's reflexes were symmetrical in all extremities and  she had no pathological reflexes.  Sensory - Light touch, temperature/pinprick were assessed and were decreased light touch sensation on the left.    Coordination - The patient had normal movements in the hands with no ataxia or dysmetria.  Tremor was absent.  Gait and Station - deferred.   ASSESSMENT/PLAN Ms. Ernestina Lorin Hauck is a 69 y.o. female with history of  CKD, DM2, GAD, GERD, HLD, Migraines, dysrhythmia (skips beats), prior SAH and robotic assisted total hysterectomy 10/27/2020 for grade 1 endometrial cancer who presents with a few hours of sensation of swelling in the left face, mild L facial droop and slurred speech with difficulty with repeating phrases. She did not receive IV t-PA due to late presentation (>4.5 hours from time of onset)  DWI negative stroke vs. Anxiety/stress related  Resultant persistent left mild paresthesia  CT Head - No evidence of acute intracranial abnormality. ASPECTS is 10. Mild chronic small vessel ischemic disease. Chronic calcification in the basal ganglia and dentate nuclei which can be seen with Fahr disease  MRI head - No acute intracranial abnormality.  MRA head - Normal  intracranial MRA.   CTA neck right ICA 75% stenosis  Carotid Doppler - Velocities in the right ICA are consistent with a 40-59% stenosis.   2D Echo - EF 60 - 65%. No cardiac source of emboli identified.   Sars Corona Virus 2 - negative  LDL - 41  HgbA1c - 7.2  VTE prophylaxis - SCDs  aspirin 81 mg daily prior to admission, now on aspirin 81 mg daily and plavix 75 DAPT.   Patient counseled to be compliant with her antithrombotic medications  Ongoing aggressive stroke risk factor management  Therapy recommendations:  HH PT - Outpt OT  Disposition:  Pending  Carotid stenosis  CTA neck right ICA 75% stenosis  Carotid Doppler - Velocities in the right ICA are consistent with a 40-59% stenosis.  Pt does complains of left mild hemiparesthesia, but not sure if this is related to right ICA stenosis  Will have VVS consult   Anxiety / stress  Pt currently has a lot of stress  Hx of anxiety/stress with neuro symptoms following with Dr. Jaynee Eagles at [facility]  Put on wellbutrin  Hyperlipidemia  Home Lipid lowering medication: Repatha  LDL 41, goal < 70  Current lipid lowering medication: none   Continue repatha at discharge  Diabetes  Home diabetic meds: none  Current diabetic meds: SSI   HgbA1c 7.2, goal < 7.0  CBG monitoring  PCP follow-up  Other Stroke Risk Factors  Advanced age  Obesity, Body mass index is 32.56 kg/m., recommend weight loss, diet and exercise as appropriate   Migraines   OSA  Other Active Problems Code status - Full code  Endometrial cancer status post surgery last Thursday.  Hospital day # 0  Pt has multiple complains and findings of carotid stenosis. I spend long time discussing with her about treatment plan and anxiety management. I spent  35 minutes in total face-to-face time with the patient, more than 50% of which was spent in counseling and coordination of care, reviewing test results, images and medication, and discussing the  diagnosis, treatment plan and potential prognosis. This patient's care requiresreview of multiple databases, neurological assessment, discussion with family, other specialists and medical decision making of high complexity.  Rosalin Hawking, MD PhD Stroke Neurology 10/30/2020 12:09 PM     To contact Stroke Continuity provider, please refer to [email or web address] After hours, contact General Neurology

## 2020-10-30 NOTE — Progress Notes (Signed)
Patient c/o SOB and swollen tongue. RN assessed pt and was NAD. BP (!) 158/80 (BP Location: Left Arm)   Pulse 92   Temp 97.9 F (36.6 C) (Oral)   Resp 19   Ht 5\' 2"  (1.575 m)   Wt 80.7 kg   SpO2 98%   BMI 32.56 kg/m   MD made aware. No new orders at this time. Will continue to monitor.

## 2020-10-30 NOTE — Discharge Summary (Signed)
Physician Discharge Summary   Catherene Kaleta VPX:[id number] DOB: 18-Jun-1951 DOA: 10/28/2020  PCP: Hulan Fess, MD  Admit date: 10/28/2020 Discharge date: 10/30/2020  Admitted From: home Disposition:  home Discharging physician: Dwyane Dee, MD  Recommendations for Outpatient Follow-up:  1. Follow up with Dr. Denman George 2. Follow up with [facility] in ~6 months 3. Asa and Plavix for 3 weeks then Plavix alone 4. Uptitrate Wellbutrin as patient tolerates; further anxiety/depression control as able    Patient discharged to home in Discharge Condition: stable CODE STATUS: Full Diet recommendation:  Diet Orders (From admission, onward)    Start     Ordered   10/30/20 0000  Diet Carb Modified        10/30/20 1633   10/29/20 0734  Diet Carb Modified Fluid consistency: Thin; Room service appropriate? Yes  Diet effective now       Question Answer Comment  Diet-HS Snack? Nothing   Calorie Level Medium 1600-2000   Fluid consistency: Thin   Room service appropriate? Yes      10/29/20 0733          Hospital Course: Ms. Bhargava is a 69 yo female with PMH endometrial CA s/p TAH/BSO and retroperitoneal node dissection, DMII, HTN, CAD, ?Fahr disease who presented to the ER after complaints of "swelling" in her left eye, itching that has been going on for months, and apparently a left facial droop and slurred speech.   There was some concern for forehead involvement but unclear and symptoms also seem to have resolved rather quickly.  She was started on aspirin on admission and underwent stroke workup.  CT head revealed chronic calcifications in the basal ganglia and dentate nuclei.  Mild chronic small vascular disease noted.  No acute abnormalities. MRI brain was negative for acute abnormalities as well.  MRA head also normal. Carotid u/s showed R ICA 40-59% and L ICA 1-39% stenosis.  CTA neck: 75% stenosis of R ICA Echo: EF 60-65%, Gr 1 DD.    * Facial droop - etiology ambiguous  at this time; CVA ruled out with normal MRI although still could be DWI negative MRI? - Remaining differential includes TIA vs ?Bells (less likely given fast resolution of sxms) vs related to Fahr disease? (follows outpatient with neuro). She also has a lot of underlying anxiety/depression and has been on a few different meds in the past. She was recently put on Wellbutrin but she is no longer taking it (I suspect due to not much improvement, but she may have not given it long enough of a chance nor increased dose) - continue asa 81 daily for now - follow up neuro eval, appreciate assistance. Discussed DAPT with neuro and Dr. Denman George. For now, neuro is okay with monotherapy aspirin 81 mg daily until patient is at least 1 week post-op.  - On 11/18 patient to start asa/plavix for 3 weeks then plavix alone - she had no further droop, etc after initial episode on admission  Carotid artery stenosis - R ICA stenosis 75% noted on CTA neck; carotid u/s shows R ICA 40-59% - neuro is recommending to also consult [facility]; spoke with Dr. Oneida Alar today for consult - continue asa 81 mg daily; patient is post-op from 11/11 (robotic-assisted lap TAH/BSO, SLN biopsy) - she was offered CEA however is declining due to anxiety associated with the surgery; for now she will continue on medical management and follow up with [facility] in 6 months for repeat carotid duplex   Anxiety and depression -  was on amitriptyline which has been reduced down to 10 mg daily (takes it moreso for headache control). She has significant anxiety and depression and is responsible for taking care of her 43 year old granddaughter.  She has a lot of anxiety associated with this. - She was amenable for starting on more medication to help control her anxiety.  This may also be contributing to her other random symptoms such as the itching and sensations that her eye is swelling when there is no obvious edema -She does not want medications that will make her  somnolent or gain weight.  She has previously been on Wellbutrin, this is a good choice and will resume it.  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  HLD (hyperlipidemia) - on repatha at home   Endometrial cancer [facility]) - s/p TAH, BSO, retroperitoneal node dissection - appreciate assistance with Dr. Denman George. Okay for asa 81 and/or DVT ppx if needed  - lap incisions inspected; healing well, no signs infection - outpatient care   Hypothyroidism - continue synthroid  Diabetes mellitus type II, non insulin dependent (HCC) - A1c 7.2% - continue SSI and CBGs    The patient's chronic medical conditions were treated accordingly per the patient's home medication regimen except as noted.  On day of discharge, patient was felt deemed stable for discharge. Patient/family member advised to call PCP or come back to ER if needed.   Principal Diagnosis: Facial droop  Discharge Diagnoses: Active Hospital Problems   Diagnosis Date Noted  . Facial droop 10/29/2020    Priority: High  . Carotid artery stenosis 10/30/2020    Priority: Medium  . Anxiety and depression 10/30/2020    Priority: Low  . 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 HLD (hyperlipidemia) 10/29/2020  . Endometrial cancer ([place]) 10/18/2020  . Hypothyroidism   . Diabetes mellitus type II, non insulin dependent ([place]) 06/16/2011    Resolved Hospital Problems  No resolved problems to display.    Discharge Instructions    Diet Carb Modified   Complete by: As directed    Increase activity slowly   Complete by: As directed      Allergies as of 10/30/2020      Reactions   Acarbose    Atenolol    Stomach problems, chills   Cinnamon    Stomach pain   Glimepiride    ALL DIABETIC MEDS PER PT    Invokana [canagliflozin]    ALL DIABETIC MEDS PER PT    Januvia [sitagliptin]     ALL DIABETIC MEDS PER PT    Losartan Potassium    Metformin And Related Nausea And Vomiting, Other (See Comments)   Patient states that she has severe chills, headache and cramping additionally. Says blood sugar is uncontrolled because of this   Other Other (See Comments)   ALL DIABETIC MEDS PER PT    Pioglitazone Other (See Comments)   ALL DIABETIC MEDS PER PT    Statins    Myalgia   Sulfamethoxazole Other (See Comments)   ALL DIABETIC MEDS PER PT    Tramadol Hcl Itching   Sulfa Antibiotics Rash, Other (See Comments)   Mother has told patient in the past not to take, she cannot recall there reaction      Medication List    TAKE these medications   acetaminophen 500 MG tablet Commonly known as:  TYLENOL Take 500 mg by mouth every 6 (six) hours as needed for moderate pain.   amitriptyline 10 MG tablet Commonly known as: ELAVIL Take 10 mg by mouth at bedtime.   aspirin EC 81 MG tablet Take 1 tablet (81 mg total) by mouth daily. Swallow whole.   bumetanide 1 MG tablet Commonly known as: BUMEX Take 1 mg by mouth every morning.   buPROPion 150 MG 24 hr tablet Commonly known as: WELLBUTRIN XL Take 1 tablet (150 mg total) by mouth daily. Start taking on: October 31, 2020   clopidogrel 75 MG tablet Commonly known as: Plavix Take 1 tablet (75 mg total) by mouth daily. Start taking on: November 03, 2020   cyanocobalamin 1000 MCG/ML injection Commonly known as: (VITAMIN B-12) Inject 1,000 mcg into the muscle every 30 (thirty) days.   levothyroxine 50 MCG tablet Commonly known as: SYNTHROID Take 50 mcg by mouth daily.   pantoprazole 40 MG tablet Commonly known as: Protonix Take 1 tablet (40 mg total) by mouth daily.   Potassium 99 MG Tabs Take 198 mg by mouth daily.   Repatha SureClick 295 MG/ML [place] Generic drug: Evolocumab Inject 1 pen into the skin every 14 (fourteen) days.   senna-docusate 8.6-50 MG tablet Commonly known as: Senokot-S Take 2 tablets by mouth  at bedtime. For AFTER surgery, do not take if having diarrhea   traMADol 50 MG tablet Commonly known as: ULTRAM Take 1 tablet (50 mg total) by mouth every 6 (six) hours as needed for severe pain. For AFTER surgery, do not take and drive       Follow-up Information    Melvenia Beam, MD. Schedule an appointment as soon as possible for a visit in 4 week(s).   Specialty: Neurology Contact information: [address] [place] [address] [phone]              Allergies  Allergen Reactions  . Acarbose   . Atenolol     Stomach problems, chills  . Cinnamon     Stomach pain  . Glimepiride     ALL DIABETIC MEDS PER PT   . Invokana [Canagliflozin]     ALL DIABETIC MEDS PER PT   . Januvia [Sitagliptin]     ALL DIABETIC MEDS PER PT   . Losartan Potassium   . Metformin And Related Nausea And Vomiting and Other (See Comments)    Patient states that she has severe chills, headache and cramping additionally. Says blood sugar is uncontrolled because of this  . Other Other (See Comments)    ALL DIABETIC MEDS PER PT   . Pioglitazone Other (See Comments)    ALL DIABETIC MEDS PER PT   . Statins     Myalgia  . Sulfamethoxazole Other (See Comments)    ALL DIABETIC MEDS PER PT   . Tramadol Hcl Itching  . Sulfa Antibiotics Rash and Other (See Comments)    Mother has told patient in the past not to take, she cannot recall there reaction    Consultations: Neuro Vascular Surgery  Discharge Exam: BP (!) 158/80 (BP Location: Left Arm)   Pulse 92   Temp 97.9 F (36.6 C) (Oral)   Resp 19   Ht 5\' 2"  (1.575 m)   Wt 80.7 kg   SpO2 98%   BMI 32.56 kg/m  General appearance: alert, cooperative and no distress Head: Normocephalic, without obvious abnormality, atraumatic Eyes: EOMI Lungs: clear to auscultation bilaterally Heart: regular rate and rhythm and S1,  S2 normal Abdomen: obese, soft, NT, ND, 5 lap incisions healing well, BS present Extremities: no edema Skin:  mobility and turgor normal Neurologic: No focal deficits. No nystagmus. PERRLA/EOMI. No tremor noted. Paresthesia per patient throughout entire left side including left facial V1-V3 as well as radial and ulnar distributions in left palm  The results of significant diagnostics from this hospitalization (including imaging, microbiology, ancillary and laboratory) are listed below for reference.   Microbiology: Recent Results (from the past 240 hour(s))  SARS CORONAVIRUS 2 (TAT 6-24 HRS) Nasopharyngeal Nasopharyngeal Swab     Status: None   Collection Time: 10/24/20  9:45 AM   Specimen: Nasopharyngeal Swab  Result Value Ref Range Status   SARS Coronavirus 2 NEGATIVE NEGATIVE Final    Comment: (NOTE) SARS-CoV-2 target nucleic acids are NOT DETECTED.  The SARS-CoV-2 RNA is generally detectable in upper and lower respiratory specimens during the acute phase of infection. Negative results do not preclude SARS-CoV-2 infection, do not rule out co-infections with other pathogens, and should not be used as the sole basis for treatment or other patient management decisions. Negative results must be combined with clinical observations, patient history, and epidemiological information. The expected result is Negative.  Fact Sheet for Patients: SugarRoll.be  Fact Sheet for Healthcare Providers: https://www.woods-mathews.com/  This test is not yet approved or cleared by the [place] FDA and  has been authorized for detection and/or diagnosis of SARS-CoV-2 by FDA under an Emergency Use Authorization (EUA). This EUA will remain  in effect (meaning this test can be used) for the duration of the COVID-19 declaration under Se ction 564(b)(1) of the Act, 21 U.S.C. section 360bbb-3(b)(1), unless the authorization is terminated or revoked sooner.  Performed at [facility], [place] [address], [place], [place] [address]   Respiratory Panel by RT PCR (Flu  A&B, Covid) - Nasopharyngeal Swab     Status: None   Collection Time: 10/28/20 10:16 PM   Specimen: Nasopharyngeal Swab  Result Value Ref Range Status   SARS Coronavirus 2 by RT PCR NEGATIVE NEGATIVE Final    Comment: (NOTE) SARS-CoV-2 target nucleic acids are NOT DETECTED.  The SARS-CoV-2 RNA is generally detectable in upper respiratoy specimens during the acute phase of infection. The lowest concentration of SARS-CoV-2 viral copies this assay can detect is 131 copies/mL. A negative result does not preclude SARS-Cov-2 infection and should not be used as the sole basis for treatment or other patient management decisions. A negative result may occur with  improper specimen collection/handling, submission of specimen other than nasopharyngeal swab, presence of viral mutation(s) within the areas targeted by this assay, and inadequate number of viral copies (<131 copies/mL). A negative result must be combined with clinical observations, patient history, and epidemiological information. The expected result is Negative.  Fact Sheet for Patients:  PinkCheek.be  Fact Sheet for Healthcare Providers:  GravelBags.it  This test is no t yet approved or cleared by the [place] FDA and  has been authorized for detection and/or diagnosis of SARS-CoV-2 by FDA under an Emergency Use Authorization (EUA). This EUA will remain  in effect (meaning this test can be used) for the duration of the COVID-19 declaration under Section 564(b)(1) of the Act, 21 U.S.C. section 360bbb-3(b)(1), unless the authorization is terminated or revoked sooner.     Influenza A by PCR NEGATIVE NEGATIVE Final   Influenza B by PCR NEGATIVE NEGATIVE Final    Comment: (NOTE) The Xpert Xpress SARS-CoV-2/FLU/RSV assay is intended as an aid in  the diagnosis of influenza from Nasopharyngeal swab specimens and  should not be used as a sole basis for treatment. Nasal  washings and  aspirates are unacceptable for Xpert Xpress SARS-CoV-2/FLU/RSV  testing.  Fact Sheet for Patients: PinkCheek.be  Fact Sheet for Healthcare Providers: GravelBags.it  This test is not yet approved or cleared by the [place] FDA and  has been authorized for detection and/or diagnosis of SARS-CoV-2 by  FDA under an Emergency Use Authorization (EUA). This EUA will remain  in effect (meaning this test can be used) for the duration of the  Covid-19 declaration under Section 564(b)(1) of the Act, 21  U.S.C. section 360bbb-3(b)(1), unless the authorization is  terminated or revoked. Performed at [facility], [place] [address], [place], [place] [address]      Labs: BNP (last 3 results) No results for input(s): BNP in the last 8760 hours. Basic Metabolic Panel: Recent Labs  Lab 10/28/20 2105 10/28/20 2112 10/30/20 0341  NA 139 140 139  K 3.9 3.8 3.6  CL 100 98 100  CO2 28  --  29  GLUCOSE 207* 200* 196*  BUN 15 17 9   CREATININE 1.07* 1.00 0.92  CALCIUM 9.6  --  9.3  MG  --   --  1.5*   Liver Function Tests: Recent Labs  Lab 10/28/20 2105  AST 19  ALT 14  ALKPHOS 54  BILITOT 0.9  PROT 6.7  ALBUMIN 3.8   No results for input(s): LIPASE, AMYLASE in the last 168 hours. No results for input(s): AMMONIA in the last 168 hours. CBC: Recent Labs  Lab 10/28/20 2105 10/28/20 2112 10/30/20 0341  WBC 5.1  --  3.7*  NEUTROABS 2.9  --  1.9  HGB 12.6 12.6 12.0  HCT 37.9 37.0 36.0  MCV 92.7  --  90.2  PLT 212  --  206   Cardiac Enzymes: No results for input(s): CKTOTAL, CKMB, CKMBINDEX, TROPONINI in the last 168 hours. BNP: Invalid input(s): POCBNP CBG: Recent Labs  Lab 10/29/20 1336 10/29/20 2148 10/30/20 0600 10/30/20 1110 10/30/20 1605  GLUCAP 190* 177* 203* 177* 190*   D-Dimer No results for input(s): DDIMER in the last 72 hours. Hgb A1c Recent Labs    10/29/20 0244  HGBA1C  7.2*   Lipid Profile Recent Labs    10/29/20 0244  CHOL 101  HDL 33*  LDLCALC 41  TRIG 137  CHOLHDL 3.1   Thyroid function studies No results for input(s): TSH, T4TOTAL, T3FREE, THYROIDAB in the last 72 hours.  Invalid input(s): FREET3 Anemia work up No results for input(s): VITAMINB12, FOLATE, FERRITIN, TIBC, IRON, RETICCTPCT in the last 72 hours. Urinalysis    Component Value Date/Time   COLORURINE YELLOW 10/18/2020 1354   APPEARANCEUR CLEAR 10/18/2020 1354   LABSPEC 1.011 10/18/2020 1354   PHURINE 6.0 10/18/2020 1354   GLUCOSEU 50 (A) 10/18/2020 1354   HGBUR SMALL (A) 10/18/2020 1354   BILIRUBINUR NEGATIVE 10/18/2020 1354   KETONESUR NEGATIVE 10/18/2020 1354   PROTEINUR NEGATIVE 10/18/2020 1354   UROBILINOGEN 0.2 05/15/2011 1809   NITRITE NEGATIVE 10/18/2020 1354   LEUKOCYTESUR MODERATE (A) 10/18/2020 1354   Sepsis Labs Invalid input(s): PROCALCITONIN,  WBC,  LACTICIDVEN Microbiology Recent Results (from the past 240 hour(s))  SARS CORONAVIRUS 2 (TAT 6-24 HRS) Nasopharyngeal Nasopharyngeal Swab     Status: None   Collection Time: 10/24/20  9:45 AM   Specimen: Nasopharyngeal Swab  Result Value Ref Range Status   SARS Coronavirus 2 NEGATIVE NEGATIVE Final  Comment: (NOTE) SARS-CoV-2 target nucleic acids are NOT DETECTED.  The SARS-CoV-2 RNA is generally detectable in upper and lower respiratory specimens during the acute phase of infection. Negative results do not preclude SARS-CoV-2 infection, do not rule out co-infections with other pathogens, and should not be used as the sole basis for treatment or other patient management decisions. Negative results must be combined with clinical observations, patient history, and epidemiological information. The expected result is Negative.  Fact Sheet for Patients: SugarRoll.be  Fact Sheet for Healthcare Providers: https://www.woods-mathews.com/  This test is not yet  approved or cleared by the [place] FDA and  has been authorized for detection and/or diagnosis of SARS-CoV-2 by FDA under an Emergency Use Authorization (EUA). This EUA will remain  in effect (meaning this test can be used) for the duration of the COVID-19 declaration under Se ction 564(b)(1) of the Act, 21 U.S.C. section 360bbb-3(b)(1), unless the authorization is terminated or revoked sooner.  Performed at [facility], [place] [address], [place], [place] [address]   Respiratory Panel by RT PCR (Flu A&B, Covid) - Nasopharyngeal Swab     Status: None   Collection Time: 10/28/20 10:16 PM   Specimen: Nasopharyngeal Swab  Result Value Ref Range Status   SARS Coronavirus 2 by RT PCR NEGATIVE NEGATIVE Final    Comment: (NOTE) SARS-CoV-2 target nucleic acids are NOT DETECTED.  The SARS-CoV-2 RNA is generally detectable in upper respiratoy specimens during the acute phase of infection. The lowest concentration of SARS-CoV-2 viral copies this assay can detect is 131 copies/mL. A negative result does not preclude SARS-Cov-2 infection and should not be used as the sole basis for treatment or other patient management decisions. A negative result may occur with  improper specimen collection/handling, submission of specimen other than nasopharyngeal swab, presence of viral mutation(s) within the areas targeted by this assay, and inadequate number of viral copies (<131 copies/mL). A negative result must be combined with clinical observations, patient history, and epidemiological information. The expected result is Negative.  Fact Sheet for Patients:  PinkCheek.be  Fact Sheet for Healthcare Providers:  GravelBags.it  This test is no t yet approved or cleared by the [place] FDA and  has been authorized for detection and/or diagnosis of SARS-CoV-2 by FDA under an Emergency Use Authorization (EUA). This EUA will remain   in effect (meaning this test can be used) for the duration of the COVID-19 declaration under Section 564(b)(1) of the Act, 21 U.S.C. section 360bbb-3(b)(1), unless the authorization is terminated or revoked sooner.     Influenza A by PCR NEGATIVE NEGATIVE Final   Influenza B by PCR NEGATIVE NEGATIVE Final    Comment: (NOTE) The Xpert Xpress SARS-CoV-2/FLU/RSV assay is intended as an aid in  the diagnosis of influenza from Nasopharyngeal swab specimens and  should not be used as a sole basis for treatment. Nasal washings and  aspirates are unacceptable for Xpert Xpress SARS-CoV-2/FLU/RSV  testing.  Fact Sheet for Patients: PinkCheek.be  Fact Sheet for Healthcare Providers: GravelBags.it  This test is not yet approved or cleared by the [place] FDA and  has been authorized for detection and/or diagnosis of SARS-CoV-2 by  FDA under an Emergency Use Authorization (EUA). This EUA will remain  in effect (meaning this test can be used) for the duration of the  Covid-19 declaration under Section 564(b)(1) of the Act, 21  U.S.C. section 360bbb-3(b)(1), unless the authorization is  terminated or revoked. Performed at [facility], [place]  [address] [place], [place] [address]     Procedures/Studies: DG Chest 2 View  Result Date: 10/29/2020 CLINICAL DATA:  Chest pain, postoperative day 1 from hysterectomy EXAM: CHEST - 2 VIEW COMPARISON:  None. FINDINGS: There is free air beneath the right hemidiaphragm and interstitial gas within the a abdominal wall noted on lateral examination in keeping with the given history of recent intra-abdominal surgery. Lungs are clear. No pneumothorax or pleural effusion. Cardiac size within normal limits. Pulmonary vascularity is normal. No acute bone abnormality. IMPRESSION: No active cardiopulmonary disease. Free air within the upper abdomen, likely postsurgical in nature. Electronically  Signed   By: Fidela [name] MD   On: 10/29/2020 01:33   DG Chest 2 View  Result Date: 10/03/2020 CLINICAL DATA:  Cough for 6 weeks EXAM: CHEST - 2 VIEW COMPARISON:  09/23/2019, 02/25/2020 FINDINGS: No focal opacity or pleural effusion. Stable cardiomediastinal silhouette with aortic atherosclerosis. No pneumothorax. Chronic right-sided rib fractures. IMPRESSION: No active cardiopulmonary disease. Electronically Signed   By: Donavan Foil M.D.   On: 10/03/2020 16:47   CT ANGIO NECK W OR WO CONTRAST  Result Date: 10/29/2020 CLINICAL DATA:  Left facial droop and slurred speech EXAM: CT ANGIOGRAPHY NECK TECHNIQUE: Multidetector CT imaging of the neck was performed using the standard protocol during bolus administration of intravenous contrast. Multiplanar CT image reconstructions and MIPs were obtained to evaluate the vascular anatomy. Carotid stenosis measurements (when applicable) are obtained utilizing NASCET criteria, using the distal internal carotid diameter as the denominator. CONTRAST:  24mL OMNIPAQUE IOHEXOL 350 MG/ML SOLN COMPARISON:  None. FINDINGS: Aortic arch: Calcific aortic atherosclerosis. Normal caliber. Normal variant branch pattern with the vertebral artery on the left arising independently of the aortic arch. Right carotid system: Atherosclerotic calcification at the carotid bifurcation causes 75% stenosis of the proximal internal carotid artery. Left carotid system: Mild calcific atherosclerosis without hemodynamically significant stenosis. Vertebral arteries: Right dominant.  Normal. Skeleton: Mild degenerative disease. Other neck: Negative Upper chest: Negative IMPRESSION: 1. 75% stenosis of the proximal right internal carotid artery due to atherosclerotic calcification. Aortic Atherosclerosis (ICD10-I70.0). Electronically Signed   By: Ulyses Jarred M.D.   On: 10/29/2020 22:38   MR ANGIO HEAD WO CONTRAST  Result Date: 10/28/2020 CLINICAL DATA:  Stroke follow-up EXAM: MRI HEAD  WITHOUT CONTRAST MRA HEAD WITHOUT CONTRAST TECHNIQUE: Multiplanar, multiecho pulse sequences of the brain and surrounding structures were obtained without intravenous contrast. Angiographic images of the head were obtained using MRA technique without contrast. COMPARISON:  None. FINDINGS: MRI HEAD FINDINGS Brain: No acute infarct, acute hemorrhage or extra-axial collection. Multifocal hyperintense T2-weighted signal within the white matter. Mild generalized volume loss. Mineralization within the basal ganglia and dentate nuclei. Chronic microhemorrhage in the right frontal white matter. Normal midline structures. Vascular: Normal flow voids. Skull and upper cervical spine: The bone marrow signal of the cranium and upper cervical vertebrae is normal. There is no skull base lesion. The visualized upper cervical spinal cord is normal. Sinuses/Orbits: There is no paranasal sinus fluid level or advanced mucosal thickening. There is no mastoid or middle ear effusion. The orbits are normal. Other: None MRA HEAD FINDINGS POSTERIOR CIRCULATION: --Vertebral arteries: Normal --Inferior cerebellar arteries: Normal. --Basilar artery: Normal. --Superior cerebellar arteries: Normal. --Posterior cerebral arteries: Normal. ANTERIOR CIRCULATION: --Intracranial internal carotid arteries: Normal. --Anterior cerebral arteries (ACA): Normal. --Middle cerebral arteries (MCA): Normal. ANATOMIC VARIANTS: None IMPRESSION: 1. No acute intracranial abnormality. 2. Normal intracranial MRA. 3. Chronic small vessel disease and volume loss. Electronically Signed   By:  Ulyses Jarred M.D.   On: 10/28/2020 23:38   MR BRAIN WO CONTRAST  Result Date: 10/28/2020 CLINICAL DATA:  Stroke follow-up EXAM: MRI HEAD WITHOUT CONTRAST MRA HEAD WITHOUT CONTRAST TECHNIQUE: Multiplanar, multiecho pulse sequences of the brain and surrounding structures were obtained without intravenous contrast. Angiographic images of the head were obtained using MRA technique  without contrast. COMPARISON:  None. FINDINGS: MRI HEAD FINDINGS Brain: No acute infarct, acute hemorrhage or extra-axial collection. Multifocal hyperintense T2-weighted signal within the white matter. Mild generalized volume loss. Mineralization within the basal ganglia and dentate nuclei. Chronic microhemorrhage in the right frontal white matter. Normal midline structures. Vascular: Normal flow voids. Skull and upper cervical spine: The bone marrow signal of the cranium and upper cervical vertebrae is normal. There is no skull base lesion. The visualized upper cervical spinal cord is normal. Sinuses/Orbits: There is no paranasal sinus fluid level or advanced mucosal thickening. There is no mastoid or middle ear effusion. The orbits are normal. Other: None MRA HEAD FINDINGS POSTERIOR CIRCULATION: --Vertebral arteries: Normal --Inferior cerebellar arteries: Normal. --Basilar artery: Normal. --Superior cerebellar arteries: Normal. --Posterior cerebral arteries: Normal. ANTERIOR CIRCULATION: --Intracranial internal carotid arteries: Normal. --Anterior cerebral arteries (ACA): Normal. --Middle cerebral arteries (MCA): Normal. ANATOMIC VARIANTS: None IMPRESSION: 1. No acute intracranial abnormality. 2. Normal intracranial MRA. 3. Chronic small vessel disease and volume loss. Electronically Signed   By: Ulyses Jarred M.D.   On: 10/28/2020 23:38   MR Pelvis W Wo Contrast  Result Date: 10/11/2020 CLINICAL DATA:  69 year old female with recently diagnosed endometrial cancer presenting for staging. EXAM: MRI PELVIS WITHOUT AND WITH CONTRAST TECHNIQUE: Multiplanar multisequence MR imaging of the pelvis was performed both before and after administration of intravenous contrast. CONTRAST:  82mL GADAVIST GADOBUTROL 1 MMOL/ML IV SOLN COMPARISON:  09/01/2020 pelvic sonogram. FINDINGS: Urinary Tract:  Normal bladder.  Normal visualized urethra. Bowel: Visualized small and large bowel are normal caliber with no bowel wall  thickening. Moderate sigmoid diverticulosis. Vascular/Lymphatic: No pathologically enlarged lymph nodes in the pelvis. No acute vascular abnormality. Reproductive: Uterus: The mildly enlarged and slightly retroverted uterus measures 8.7 x 5.0 x 5.7 cm. There is an exophytic subserosal posterior left uterine body 2.5 x 2.0 x 2.6 cm fibroid. No additional uterine fibroids. Diffusely markedly thickened and enhancing endometrium measuring up to 32.5 mm in bilayer thickness. There is greater than 50% myometrial invasion by the endometrial tumor, estimated at approximately 80% myometrial invasion. No evidence of extra-uterine tumor extension. Cervical stroma is intact. Ovaries and Adnexa: The right ovary measures 2.2 x 1.6 x 1.5 cm and is normal. The left ovary measures 2.1 x 1.6 x 1.3 cm and is normal. There are no suspicious ovarian or adnexal masses. Other: No abnormal free fluid in the pelvis. No focal pelvic fluid collection. Musculoskeletal: No aggressive appearing focal osseous lesions. L4 vertebral body hemangioma. IMPRESSION: 1. Diffusely thickened (32.5 mm) and enhancing endometrium compatible with known endometrial malignancy, with greater than 50% myometrial invasion by the endometrial tumor (estimated at approximately 80% myometrial invasion). No evidence of extra-uterine tumor extension. FIGO stage IB by MRI. 2. No lymphadenopathy or other findings of metastatic disease in the pelvis. 3. Solitary 2.6 cm subserosal posterior left uterine body fibroid. 4. Moderate sigmoid diverticulosis. Electronically Signed   By: Ilona Sorrel M.D.   On: 10/11/2020 09:28   ECHOCARDIOGRAM COMPLETE  Result Date: 10/29/2020    ECHOCARDIOGRAM REPORT   Patient Name:   Lorita Officer [facility] Date of Exam: 10/29/2020 Medical Rec #:  [id number]  Height:       62.0 in Accession #:    [id number]            Weight:       178.0 lb Date of Birth:  23-Mar-1951            BSA:          1.819 m Patient Age:    59 years               BP:           134/75 mmHg Patient Gender: F                     HR:           87 bpm. Exam Location:  Inpatient Procedure: 2D Echo, Cardiac Doppler and Color Doppler Indications:    TIA  History:        Patient has prior history of Echocardiogram examinations, most                 recent 11/27/2019. CAD, TIA; Risk Factors:Diabetes, Hypertension                 and Dyslipidemia.  Sonographer:    Dustin Flock Referring Phys: [place]  1. Left ventricular ejection fraction, by estimation, is 60 to 65%. The left ventricle has normal function. The left ventricle has no regional wall motion abnormalities. Left ventricular diastolic parameters are consistent with Grade I diastolic dysfunction (impaired relaxation). Elevated left atrial pressure.  2. Right ventricular systolic function is normal. The right ventricular size is normal. There is moderately elevated pulmonary artery systolic pressure. The estimated right ventricular systolic pressure is 25.3 mmHg.  3. Left atrial size was mildly dilated.  4. The mitral valve is normal in structure. Mild mitral valve regurgitation. Mild mitral stenosis. The mean mitral valve gradient is 5.5 mmHg with average heart rate of 85 bpm. Moderate to severe mitral annular calcification.  5. The aortic valve is normal in structure. Aortic valve regurgitation is not visualized. Mild aortic valve sclerosis is present, with no evidence of aortic valve stenosis.  6. The inferior vena cava is normal in size with <50% respiratory variability, suggesting right atrial pressure of 8 mmHg. FINDINGS  Left Ventricle: Left ventricular ejection fraction, by estimation, is 60 to 65%. The left ventricle has normal function. The left ventricle has no regional wall motion abnormalities. The left ventricular internal cavity size was normal in size. There is  no left ventricular hypertrophy. Left ventricular diastolic parameters are consistent with Grade I diastolic  dysfunction (impaired relaxation). Elevated left atrial pressure. Right Ventricle: The right ventricular size is normal. No increase in right ventricular wall thickness. Right ventricular systolic function is normal. There is moderately elevated pulmonary artery systolic pressure. The tricuspid regurgitant velocity is 3.39 m/s, and with an assumed right atrial pressure of 8 mmHg, the estimated right ventricular systolic pressure is 66.4 mmHg. Left Atrium: Left atrial size was mildly dilated. Right Atrium: Right atrial size was normal in size. Pericardium: There is no evidence of pericardial effusion. Mitral Valve: The mitral valve is normal in structure. Moderate to severe mitral annular calcification. Mild mitral valve regurgitation. Mild mitral valve stenosis. The mean mitral valve gradient is 5.5 mmHg with average heart rate of 85 bpm. Tricuspid Valve: The tricuspid valve is normal in structure. Tricuspid valve regurgitation is trivial. No evidence of tricuspid stenosis. Aortic Valve: The aortic valve is normal in  structure. Aortic valve regurgitation is not visualized. Mild aortic valve sclerosis is present, with no evidence of aortic valve stenosis. Pulmonic Valve: The pulmonic valve was normal in structure. Pulmonic valve regurgitation is not visualized. No evidence of pulmonic stenosis. Aorta: The aortic root is normal in size and structure. Venous: The inferior vena cava is normal in size with less than 50% respiratory variability, suggesting right atrial pressure of 8 mmHg. IAS/Shunts: No atrial level shunt detected by color flow Doppler.  LEFT VENTRICLE PLAX 2D LVIDd:         4.00 cm  Diastology LVIDs:         2.40 cm  LV e' medial:    5.66 cm/s LV PW:         1.00 cm  LV E/e' medial:  18.2 LV IVS:        0.90 cm  LV e' lateral:   5.11 cm/s LVOT diam:     2.00 cm  LV E/e' lateral: 20.2 LV SV:         77 LV SV Index:   42 LVOT Area:     3.14 cm  RIGHT VENTRICLE RV Basal diam:  3.90 cm RV S prime:     19.30  cm/s TAPSE (M-mode): 3.3 cm LEFT ATRIUM             Index       RIGHT ATRIUM           Index LA diam:        4.10 cm 2.25 cm/m  RA Area:     18.30 cm LA Vol (A2C):   57.5 ml 31.61 ml/m RA Volume:   50.10 ml  27.54 ml/m LA Vol (A4C):   45.5 ml 25.01 ml/m LA Biplane Vol: 54.1 ml 29.74 ml/m  AORTIC VALVE LVOT Vmax:   110.00 cm/s LVOT Vmean:  72.700 cm/s LVOT VTI:    0.246 m  AORTA Ao Root diam: 2.90 cm MITRAL VALVE                TRICUSPID VALVE MV Area (PHT): 2.16 cm     TR Peak grad:   46.0 mmHg MV Mean grad:  5.5 mmHg     TR Vmax:        339.00 cm/s MV PHT:        105.17 msec MV Decel Time: 351 msec     SHUNTS MV E velocity: 103.00 cm/s  Systemic VTI:  0.25 m MV A velocity: 153.00 cm/s  Systemic Diam: 2.00 cm MV E/A ratio:  0.67 Mihai Croitoru MD Electronically signed by Sanda Klein MD Signature Date/Time: 10/29/2020/12:47:01 PM    Final    CT HEAD CODE STROKE WO CONTRAST  Result Date: 10/28/2020 CLINICAL DATA:  Code stroke. Neuro deficit, acute, stroke suspected. Left facial droop. EXAM: CT HEAD WITHOUT CONTRAST TECHNIQUE: Contiguous axial images were obtained from the base of the skull through the vertex without intravenous contrast. COMPARISON:  Head CT and MRI 02/26/2020 FINDINGS: Brain: There is no evidence of an acute infarct, intracranial hemorrhage, mass, midline shift, or extra-axial fluid collection. Mild cerebral atrophy is within normal limits for age. Extensive symmetric calcification in the lentiform and dentate nuclei bilaterally is unchanged. Hypodensities in the cerebral white matter bilaterally are unchanged and nonspecific but compatible with mild chronic small vessel ischemic disease. Vascular: Calcified atherosclerosis at the skull base. No suspicious acute arterial hyperdensity. Skull: No fracture or suspicious osseous lesion. Sinuses/Orbits: Visualized paranasal sinuses and mastoid air cells are  clear. Right cataract extraction. Other: None. ASPECTS [facility] Stroke Program Early  CT Score) - Ganglionic level infarction (caudate, lentiform nuclei, internal capsule, insula, M1-M3 cortex): 7 - Supraganglionic infarction (M4-M6 cortex): 3 Total score (0-10 with 10 being normal): 10 IMPRESSION: 1. No evidence of acute intracranial abnormality. ASPECTS is 10. 2. Mild chronic small vessel ischemic disease. 3. 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 These results were called by telephone at the time of interpretation on 10/28/2020 at 9:20 pm to Dr. Lorrin Goodell, who verbally acknowledged these results. Electronically Signed   By: Logan Bores M.D.   On: 10/28/2020 21:24   VAS US CAROTID (at [facility] and [facility] only)  Result Date: 10/30/2020 Carotid Arterial Duplex Study Indications:       TIA. Risk Factors:      Diabetes. Comparison Study:  No prior studies. Performing Technologist: Oliver Hum RVT  Examination Guidelines: A complete evaluation includes B-mode imaging, spectral Doppler, color Doppler, and power Doppler as needed of all accessible portions of each vessel. Bilateral testing is considered an integral part of a complete examination. Limited examinations for reoccurring indications may be performed as noted.  Right Carotid Findings: +----------+--------+--------+--------+-----------------------+--------+           PSV cm/sEDV cm/sStenosisPlaque Description     Comments +----------+--------+--------+--------+-----------------------+--------+ CCA Prox  90      11                                     tortuous +----------+--------+--------+--------+-----------------------+--------+ CCA Distal67      16              smooth and heterogenous         +----------+--------+--------+--------+-----------------------+--------+ ICA Prox  162     46      40-59%  calcific                        +----------+--------+--------+--------+-----------------------+--------+ ICA Mid   113     15                                               +----------+--------+--------+--------+-----------------------+--------+ ICA Distal74      23                                              +----------+--------+--------+--------+-----------------------+--------+ ECA       82      10                                              +----------+--------+--------+--------+-----------------------+--------+ +----------+--------+-------+--------+-------------------+           PSV cm/sEDV cmsDescribeArm Pressure (mmHG) +----------+--------+-------+--------+-------------------+ [id number]                                         +----------+--------+-------+--------+-------------------+ +---------+--------+--+--------+--+---------+ VertebralPSV cm/s68EDV cm/s20Antegrade +---------+--------+--+--------+--+---------+  Left Carotid Findings: +----------+--------+--------+--------+-----------------------+--------+  PSV cm/sEDV cm/sStenosisPlaque Description     Comments +----------+--------+--------+--------+-----------------------+--------+ CCA Prox  75      13              smooth and heterogenoustortuous +----------+--------+--------+--------+-----------------------+--------+ CCA Distal75      20              smooth and heterogenous         +----------+--------+--------+--------+-----------------------+--------+ ICA Prox  49      16                                              +----------+--------+--------+--------+-----------------------+--------+ ICA Distal128     32                                     tortuous +----------+--------+--------+--------+-----------------------+--------+ ECA       71      7                                               +----------+--------+--------+--------+-----------------------+--------+ +----------+--------+--------+--------+-------------------+           PSV cm/sEDV cm/sDescribeArm Pressure (mmHG)  +----------+--------+--------+--------+-------------------+ [id number]                                         +----------+--------+--------+--------+-------------------+ +---------+--------+--+--------+--+---------+ VertebralPSV cm/s88EDV cm/s14Antegrade +---------+--------+--+--------+--+---------+   Summary: Right Carotid: Velocities in the right ICA are consistent with a 40-59%                stenosis. Left Carotid: Velocities in the left ICA are consistent with a 1-39% stenosis. Vertebrals: Bilateral vertebral arteries demonstrate antegrade flow. *See table(s) above for measurements and observations.  Electronically signed by Ruta Hinds MD on 10/30/2020 at 10:11:06 AM.    Final      Time coordinating discharge: Over 30 minutes    Dwyane Dee, MD  [facility] 10/30/2020, 4:42 PM

## 2020-10-30 NOTE — Assessment & Plan Note (Addendum)
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 Oneida Alar today for consult - continue asa 81 mg daily; patient is post-op from 11/11 (robotic-assisted lap TAH/BSO, SLN biopsy) - she was offered CEA however is declining due to anxiety associated with the surgery; for now she will continue on medical management and follow up with [facility] in 6 months for repeat carotid duplex

## 2020-10-30 NOTE — Progress Notes (Signed)
Central tele states pt cardiac leads remain. This rn arrived to room to reapply leads. Pt with urgent need to move bowels and  Suddenly dashed to bathroom. Will reapply leads after pt out of bathroom. Pt awaiting discharge to home.

## 2020-10-30 NOTE — Plan of Care (Signed)
Pt was seen by Dr. Oneida Alar and pt does not want to consider CEA at this time. She will follow up with Dr. Oneida Alar as outpt to monitor right ICA stenosis. Will continue her DAPT for 3 weeks and then plavix alone. Will continue repatha for HLD treatment. She will also follow up with Dr. Jaynee Eagles at [facility] in 4 weeks. Ok from neuro standpoint to be discharged.   Pt was educated on BP monitoring at home. BP goal 130-150 due to left ICA stenosis. Avoid low BP. Also educated on coping with anxiety and stress at home.  Neurology will sign off. Please call with questions. Pt will follow up with Dr. Jaynee Eagles at [facility] in about 4 weeks. Thanks for the consult.  Rosalin Hawking, MD PhD Stroke Neurology 10/30/2020 4:28 PM

## 2020-10-30 NOTE — Assessment & Plan Note (Signed)
-   was on amitriptyline which has been reduced down to 10 mg daily (takes it moreso for headache control). She has significant anxiety and depression and is responsible for taking care of her 71 year old granddaughter.  She has a lot of anxiety associated with this. - She was amenable for starting on more medication to help control her anxiety.  This may also be contributing to her other random symptoms such as the itching and sensations that her eye is swelling when there is no obvious edema -She does not want medications that will make her somnolent or gain weight.  She has previously been on Wellbutrin, this is a good choice and will resume it.  She should give it a fair trial and attempt dosage increase outpatient

## 2020-10-30 NOTE — Progress Notes (Signed)
5 runs of PVC's notified by CCMD. Pt sleeping at this time. MD made aware, will continue to monitor.

## 2020-10-30 NOTE — Consult Note (Signed)
Referring Physician: [facility]  Patient name: [name] MRN: [id number] DOB: [birth year] Sex: female  REASON FOR CONSULT: Right carotid stenosis  HPI: [name] is a 69 y.o. female, who recently had a GYN procedure and a few days later was noted to have numbness and tingling and swelling on the left side of her face and eye.  She had a full evaluation including an MRI of the brain which showed no stroke CT scan of the head which showed no bleed carotid duplex scan and CT angio of the neck.  She currently still has a sensation that the left side is not completely normal but states it has recovered some.  She has not had any episodes of weakness in the upper or lower extremities.  She has not had any episodes of amaurosis.  She has had no prior similar events.  She states that she does have a lot of stress in her life currently.  She has been evaluated in the past for arrhythmia by cardiology.  She has never had a myocardial infarction.  She has never had a prior stroke.  She has been on aspirin for several years.  She currently is on aspirin and a statin.  Other medical problems include diabetes, migraines, hypertension diabetes all of which have been stable.  Past Medical History:  Diagnosis Date  . Allergy   . Anemia   . Arthritis   . Asthma    Dianosed years ago-no meds at this time  . Cancer [facility])    endometrial   . Cataract    Right eye removed-still has left cataract   . Chronic kidney disease 1997   Nepthrotic syndrom with minimal change-in remission per pt - last seen by kidney MD- 2 mos ago ? name of MD   . Diabetes mellitus    type 2   . Dyspnea    with exertion   . Dysrhythmia    "skips beats"  . Family history of adverse reaction to anesthesia    son stopped breathing during surgery , mother slow to wake up   . Fibromyalgia 1980's  . Generalized anxiety disorder   . GERD (gastroesophageal reflux disease)    occ- will use Tums and  Prolosec  prn   . Heart murmur   . Hyperlipemia   . Hypertension    not on blood pressure meds due to allergies - last dose of meds- months ago   . Hypothyroidism   . Irritable bowel syndrome 1980's  . Major depressive disorder   . Migraine headaches 1980's   On meds-well controlled  . 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  . Morbid obesity ([place])   . Pneumonia 2009   Several times over the past several years  . PONV (postoperative nausea and vomiting)    was told by neurologist due to calcificaton in brain not to be put to sleep, N/V during Op and Recovery   . Recurrent upper respiratory infection (URI)   . Sleep apnea    no cpap  . Subarachnoid hemorrhage ([place]) 2010  . Vaginal polyp    benign per pt   Past Surgical History:  Procedure Laterality Date  . CHOLECYSTECTOMY  1983  . COLONOSCOPY  2008   DB  . [place] OF UTERUS  1972  . HYSTEROSCOPY WITH D & C  06/29/2011   Procedure: DILATATION AND CURETTAGE (D&C) /HYSTEROSCOPY;  Surgeon: Donnamae Jude, MD;  Location: [facility];  Service: Gynecology;  Laterality:  N/A;  . ROBOTIC ASSISTED TOTAL HYSTERECTOMY WITH BILATERAL SALPINGO OOPHERECTOMY N/A 10/27/2020   Procedure: XI ROBOTIC ASSISTED TOTAL HYSTERECTOMY WITH BILATERAL SALPINGO OOPHORECTOMY;  Surgeon: Everitt Amber, MD;  Location: [facility];  Service: Gynecology;  Laterality: N/A;  . SENTINEL NODE BIOPSY N/A 10/27/2020   Procedure: SENTINEL NODE BIOPSY;  Surgeon: Everitt Amber, MD;  Location: [facility];  Service: Gynecology;  Laterality: N/A;    Family History  Problem Relation Age of Onset  . Liver cancer Father        mets to liver   . Bladder Cancer Father   . Colon cancer Father        mets to colon   . Anesthesia problems Mother        hard to wake post op   . Cerebrovascular Disease Mother        Never had stroke, but had CEA.  . High blood pressure Other   . Diabetes Other   . Arthritis Other   . Anesthesia problems Son        stopped breathing post op    . Tremor Son   . Schizophrenia Daughter        Schizoaffective disorder  . Anesthesia problems Granddaughter        PONV  . Dementia Neg Hx   . Esophageal cancer Neg Hx   . Rectal cancer Neg Hx   . Stomach cancer Neg Hx   . Colon polyps Neg Hx     SOCIAL HISTORY: Social History   Socioeconomic History  . Marital status: Divorced    Spouse name: Not on file  . Number of children: 3  . Years of education: 76  . Highest education level: Not on file  Occupational History  . Occupation: Retired   Tobacco Use  . Smoking status: Never Smoker  . Smokeless tobacco: Never Used  Vaping Use  . Vaping Use: Never used  Substance and Sexual Activity  . Alcohol use: No    Comment: quit 1978  . Drug use: No  . Sexual activity: Not Currently  Other Topics Concern  . Not on file  Social History Narrative   Lives at home. Her granddaughter lives with her.    Right handed   Social Determinants of Health   Financial Resource Strain:   . Difficulty of Paying Living Expenses: Not on file  Food Insecurity:   . Worried About Charity fundraiser in the Last Year: Not on file  . Ran Out of Food in the Last Year: Not on file  Transportation Needs:   . Lack of Transportation (Medical): Not on file  . Lack of Transportation (Non-Medical): Not on file  Physical Activity:   . Days of Exercise per Week: Not on file  . Minutes of Exercise per Session: Not on file  Stress:   . Feeling of Stress : Not on file  Social Connections:   . Frequency of Communication with Friends and Family: Not on file  . Frequency of Social Gatherings with Friends and Family: Not on file  . Attends Religious Services: Not on file  . Active Member of Clubs or Organizations: Not on file  . Attends Archivist Meetings: Not on file  . Marital Status: Not on file  Intimate Partner Violence:   . Fear of Current or Ex-Partner: Not on file  . Emotionally Abused: Not on file  . Physically Abused: Not on file   . Sexually Abused: Not on file  Allergies  Allergen Reactions  . Acarbose   . Atenolol     Stomach problems, chills  . Cinnamon     Stomach pain  . Glimepiride     ALL DIABETIC MEDS PER PT   . Invokana [Canagliflozin]     ALL DIABETIC MEDS PER PT   . Januvia [Sitagliptin]     ALL DIABETIC MEDS PER PT   . Losartan Potassium   . Metformin And Related Nausea And Vomiting and Other (See Comments)    Patient states that she has severe chills, headache and cramping additionally. Says blood sugar is uncontrolled because of this  . Other Other (See Comments)    ALL DIABETIC MEDS PER PT   . Pioglitazone Other (See Comments)    ALL DIABETIC MEDS PER PT   . Statins     Myalgia  . Sulfamethoxazole Other (See Comments)    ALL DIABETIC MEDS PER PT   . Tramadol Hcl Itching  . Sulfa Antibiotics Rash and Other (See Comments)    Mother has told patient in the past not to take, she cannot recall there reaction    Current Facility-Administered Medications  Medication Dose Route Frequency Provider Last Rate Last Admin  . acetaminophen (TYLENOL) tablet 650 mg  650 mg Oral Q4H PRN Etta Quill, DO   650 mg at 10/29/20 2227   Or  . acetaminophen (TYLENOL) 160 MG/5ML solution 650 mg  650 mg Per Tube Q4H PRN Etta Quill, DO       Or  . acetaminophen (TYLENOL) suppository 650 mg  650 mg Rectal Q4H PRN Etta Quill, DO      . acetaminophen (TYLENOL) tablet 650 mg  650 mg Oral Once Launa Flight, MD      . alum & mag hydroxide-simeth (MAALOX/MYLANTA) 200-200-20 MG/5ML suspension 30 mL  30 mL Oral Q4H PRN Dwyane Dee, MD   30 mL at 10/29/20 2100  . amitriptyline (ELAVIL) tablet 10 mg  10 mg Oral Standley Brooking, MD   10 mg at 10/29/20 2212  . aspirin EC tablet 81 mg  81 mg Oral Daily Dwyane Dee, MD      . bumetanide [facility]) tablet 1 mg  1 mg Oral q morning - 10a Dwyane Dee, MD   1 mg at 10/30/20 1009  . buPROPion (WELLBUTRIN XL) 24 hr tablet 150 mg  150 mg Oral Daily  Dwyane Dee, MD   150 mg at 10/30/20 1048  . dicyclomine (BENTYL) capsule 10 mg  10 mg Oral TID PRN Dwyane Dee, MD   10 mg at 10/30/20 1009  . hydrOXYzine (ATARAX/VISTARIL) tablet 25 mg  25 mg Oral TID PRN Dwyane Dee, MD      . insulin aspart (novoLOG) injection 0-5 Units  0-5 Units Subcutaneous QHS Alcario Drought, Jared M, DO      . insulin aspart (novoLOG) injection 0-9 Units  0-9 Units Subcutaneous TID WC Etta Quill, DO   3 Units at 10/30/20 1156  . levothyroxine (SYNTHROID) tablet 50 mcg  50 mcg Oral Q0600 Dwyane Dee, MD   50 mcg at 10/30/20 1610  . magnesium citrate solution 1 Bottle  1 Bottle Oral Daily PRN Dwyane Dee, MD   1 Bottle at 10/30/20 1159  . pantoprazole (PROTONIX) EC tablet 40 mg  40 mg Oral Daily Dwyane Dee, MD   40 mg at 10/30/20 1009  . polyethylene glycol (MIRALAX / GLYCOLAX) packet 17 g  17 g Oral Daily PRN Etta Quill, DO      .  polyethylene glycol (MIRALAX / GLYCOLAX) packet 17 g  17 g Oral Daily Dwyane Dee, MD   17 g at 10/30/20 1048  . senna-docusate (Senokot-S) tablet 2 tablet  2 tablet Oral QHS Jennette Kettle M, DO      . simethicone [facility]) chewable tablet 80 mg  80 mg Oral QID PRN Dwyane Dee, MD   80 mg at 10/30/20 1008  . sodium chloride flush (NS) 0.9 % injection 3 mL  3 mL Intravenous Once Gareth Morgan, MD      . traMADol Veatrice Bourbon) tablet 50 mg  50 mg Oral Q6H PRN Etta Quill, DO        ROS:   General:  No weight loss, Fever, chills  HEENT: No recent headaches, no nasal bleeding, no visual changes, no sore throat  Neurologic: No dizziness, blackouts, seizures. + recent symptoms of stroke or mini- stroke. No recent episodes of slurred speech, or temporary blindness.  Cardiac: No recent episodes of chest pain/pressure, no shortness of breath at rest.  No shortness of breath with exertion.  Denies history of atrial fibrillation or irregular heartbeat  Vascular: No history of rest pain in feet.  No history of  claudication.  No history of non-healing ulcer, No history of DVT   Pulmonary: No home oxygen, no productive cough, no hemoptysis,  No asthma or wheezing  Musculoskeletal:  [ ]  Arthritis, [ ]  Low back pain,  [ ]  Joint pain  Hematologic:No history of hypercoagulable state.  No history of easy bleeding.  No history of anemia  Gastrointestinal: No hematochezia or melena,  No gastroesophageal reflux, no trouble swallowing  Urinary: [ ]  chronic Kidney disease, [ ]  on HD - [ ]  MWF or [ ]  TTHS, [ ]  Burning with urination, [ ]  Frequent urination, [ ]  Difficulty urinating;   Skin: No rashes  Psychological: No history of anxiety,  No history of depression   Physical Examination  Vitals:   10/29/20 1815 10/29/20 2019 10/30/20 0032 10/30/20 0346  BP: (!) 129/110 139/74 (!) 167/86 (!) 158/80  Pulse: 90 84 95 92  Resp: 16 18 18 19   Temp: 98 F (36.7 C) 97.9 F (36.6 C) 97.6 F (36.4 C) 97.9 F (36.6 C)  TempSrc: Oral Oral Oral Oral  SpO2: 99% 97% 99% 98%  Weight:      Height:        Body mass index is 32.56 kg/m.  General:  Alert and oriented, no acute distress HEENT: Normal Neck: No JVD Pulmonary: Clear to auscultation bilaterally Cardiac: Regular Rate and Rhythm Abdomen: Soft, non-tender, non-distended, no mass Skin: No rash Extremity Pulses:  2+ radial, brachial, femoral, dorsalis pedis, posterior tibial pulses bilaterally Musculoskeletal: No deformity or edema  Neurologic: Upper and lower extremity motor 5/5 and symmetric  DATA:  I reviewed the images of the patient's recent CT scan of the neck which shows greater than 75% moderately calcified stenosis origin right internal carotid artery.  No significant left-sided stenosis.  Also reviewed her carotid duplex exam which suggested 40 to 60% right internal carotid artery stenosis.  ASSESSMENT: 75% stenosis right internal carotid artery by CT angiogram.  This most likely is an asymptomatic lesion as her symptoms do not really  sound completely consistent with TIA or stroke.  Certainly the left eye swelling symptoms does not correlate.  I did discuss with her the possibility of carotid endarterectomy for stroke prophylaxis and she is very anxious about proceeding with this currently.   PLAN: Patient currently does not  wish to proceed with right carotid endarterectomy.  We will arrange for a follow-up carotid duplex scan for the patient in 6 months time.  If she has a new event prior to this consideration would again be given for carotid endarterectomy.  Patient was informed of risks benefits possible complications of medical therapy management 5 to 10% stroke risk per year versus carotid endarterectomy 1 to 2%.  She is opted for medical management for now.  She will call us if she develops any stroke type symptoms.  I discussed these with her today.  She will continue her aspirin and statin.   Ruta Hinds, MD [facility] Office: [phone]

## 2020-10-30 NOTE — Progress Notes (Signed)
Pt adamant regarding not wanting bed alarm activated and not  having assistance with ambulation commenting "I do not want to bother anybody and the alarm is irritating". Explained need and safety of both risk fall measures and reassured pt of staff commitments to service all pts. Pt ambulating slowly in room with cane from home, gait appears steady. BSC placed at bedside and pt agrees to use.

## 2020-10-30 NOTE — Progress Notes (Signed)
Pt remains in bathroom, states she is having diarrhea

## 2020-10-31 NOTE — Telephone Encounter (Signed)
[name] states that she is eating, drinking, and urinating well. Afebrile. Abdominal pain controlled with 1 extra strength tylenol qid. Pt continuing to has some vaginal bleeding from surgery.  She is not soaking through any pads and does not need to change pads. She resumed her ASA 81 mg.  Told her that Dr. Denman George said to resume the Plavix on 11-03-20 in addition to the ASA as noted in D/C note 10-30-20 from hospital if she is not having any vaginal spotting or bleeding. If she has bleeding or spotting,  she needs to wait to add the Plavix until she has no spotting or bleeding. When she resumes the Plavix and ASA she is to stop the Plavix and call the office.  Isabella Delair verbalized understanding.

## 2020-11-01 ENCOUNTER — Telehealth: Payer: Self-pay | Admitting: Gynecologic Oncology

## 2020-11-02 ENCOUNTER — Inpatient Hospital Stay (HOSPITAL_BASED_OUTPATIENT_CLINIC_OR_DEPARTMENT_OTHER): Payer: Medicare HMO | Admitting: Gynecologic Oncology

## 2020-11-02 DIAGNOSIS — Z90722 Acquired absence of ovaries, bilateral: Secondary | ICD-10-CM

## 2020-11-02 DIAGNOSIS — Z90710 Acquired absence of both cervix and uterus: Secondary | ICD-10-CM

## 2020-11-02 DIAGNOSIS — Z7189 Other specified counseling: Secondary | ICD-10-CM

## 2020-11-02 DIAGNOSIS — C541 Malignant neoplasm of endometrium: Secondary | ICD-10-CM

## 2020-11-02 LAB — SURGICAL PATHOLOGY

## 2020-11-02 NOTE — Progress Notes (Signed)
Gynecologic Oncology Telehealth Follow-up Note  I connected with [name] on 11/02/20 at  4:15 PM EST by telephone and verified that I am speaking with the correct person using two identifiers.  I discussed the limitations, risks, security and privacy concerns of performing an evaluation and management service by telemedicine and the availability of in-person appointments. I also discussed with the patient that there may be a patient responsible charge related to this service. The patient expressed understanding and agreed to proceed.  Other persons participating in the visit and their role in the encounter: Sister, son, grand daughter.  Patient's location: home Provider's location: [facility]  Chief Complaint: endometrial cancer  Assessment/Plan:  [name]  is a 69 y.o.  year old P3 with stage IA grade 1 endometrial cancer, MMR deficient, s/p surgical staging on 10/27/20 . Pathology revealed low risk factors for recurrence, therefore no adjuvant therapy is recommended according to NCCN guidelines.  I discussed risk for recurrence and typical symptoms encouraged her to notify [name] of these should they develop between visits.  I recommend she have follow-up every 6 months for 5 years in accordance with NCCN guidelines. Those visits should include symptom assessment, physical exam and pelvic examination. Pap smears are not indicated or recommended in the routine surveillance of endometrial cancer.  I am recommending starting plavix no sooner than 1 week postop AND when there is no more vaginal bleeding given her ambiguous neuro symptoms/exam from her recent hospitalization and the high risk for postop bleeding complications.   HPI: [name] is a 69 year old P 3 who was seen in consultation at the request of [name] for evaluation and treatment of grade 1 endometrial cancer (MMR abnormal).   Her symptoms began with postmenopausal bleeding  in late September 2021.  She saw [name] for a new patient consultation on 08/26/20.  Work-up of symptoms included transvaginal [name], and biopsy. Transvaginal [name] on 09/01/20 showed a uterus measuring 9.4 x 3.6 x 5.4 cm with an endometrial thickness of 6 mm.  The ovaries were grossly normal bilaterally. Endometrial sampling with endometrial pipelle biopsy was performed on 09/19/20 and showed 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  She has been having issues in the recent year with word finding difficulty and weakness for which she has had extensive neurologic work-up.  This included head imaging and it was felt that she had progressive calcifications in her brain.  She had been told in the past that she could not have general anesthesia due to her prior brain injury.  Additionally she is known to have coronary artery calcifications consistent with coronary artery disease in addition to history of a dysrhythmia for which she sees Isabella. Irish [name].   She has a history of nephrotic syndrome and chronic kidney disease.  She has a history of diabetes mellitus, type II, for which she was prescribed multiple oral antiglycemic's in the past none of which she tolerated.  She declined taking insulin.  Her last hemoglobin A1c was 6.9% in April 2021.  In addition to the above-stated issues she has hypothyroidism hypertension hyperlipidemia gastroesophageal reflux and asthma.    She lives with her [name] who is 57-1/71 years old.  MRI was performed of  the pelvis on 10/10/2020 and showed a deeply invasive myometrial extending tumor from the endometrium.  There was no apparent lymphadenopathy or extrauterine disease present, however this appeared to be at least  clinical stage Ib.  She had seen her cardiologist for optimization of her stratification and was felt to be a good candidate for surgery by him.  Her neurologist was contacted regarding candidacy for surgery and Trendelenburg positioning with her prior intracranial calcifications.  Her neurologist declined to comment on risk for this.  Hemoglobin A1c on 09/30/2020 was 7.2%.  She decided to proceed with definitive surgical staging.   Interval Hx:  On 10/27/20 she underwent robotic assisted total hysterectomy, BSO, SLN biopsy. Intraoperative findings were significant for a 10cm fibroid uterus with anterior prolapse, suspicious left external iliac SLN, normal peritoneum. Surgery was uncomplicated.  Final pathology revealed a FIGO grade 1 endometrioid adenocarcinoma of the endometrium, there was 2 of 95m myometrial invasion, no LVSI and negative cervix and adnexa and lymph nodes. MMR testing showed loss of expression of MLH1 and PMS2.   Following surgery she developed symptoms of a left [facility]op [facility]  Comprehensive neurologic testing failed to demonstrate evidence of a CVA. She was empirically prescribed plavix, however counseled to avoid this in at least the first week after surgery.   She reported to me that she was having persistent light vaginal bleeding since surgery.    Current Meds:  Outpatient Encounter Medications as of 11/02/2020  Medication Sig  . acetaminophen (TYLENOL) 500 MG tablet Take 500 mg by mouth every 6 (six) hours as needed for moderate pain.  .Marland Kitchenamitriptyline (ELAVIL) 10 MG tablet Take 10 mg by mouth at bedtime.   .Marland Kitchenaspirin EC 81 MG tablet Take 1 tablet (81 mg total) by mouth daily. Swallow whole.  . bumetanide (BUMEX) 1 MG tablet Take 1 mg by mouth every morning.  .Marland KitchenbuPROPion (WELLBUTRIN XL) 150 MG 24 hr tablet Take 1 tablet (150 mg total) by mouth daily.  .Derrill MemoON 11/03/2020] clopidogrel (PLAVIX) 75 MG tablet Take 1  tablet (75 mg total) by mouth daily.  . cyanocobalamin (,VITAMIN B-12,) 1000 MCG/ML injection Inject 1,000 mcg into the muscle every 30 (thirty) days.   . Evolocumab (REPATHA SURECLICK) 1161MG/ML SOAJ Inject 1 pen into the skin every 14 (fourteen) days.  . hydrOXYzine (ATARAX/VISTARIL) 10 MG tablet Take 1 tablet (10 mg total) by mouth 3 (three) times daily as needed for itching or anxiety.  .Marland Kitchenlevothyroxine (SYNTHROID, LEVOTHROID) 50 MCG tablet Take 50 mcg by mouth daily.   . pantoprazole (PROTONIX) 40 MG tablet Take 1 tablet (40 mg total) by mouth daily.  . Potassium 99 MG TABS Take 198 mg by mouth daily.  .Marland Kitchensenna-docusate (SENOKOT-S) 8.6-50 MG tablet Take 2 tablets by mouth at bedtime. For AFTER surgery, do not take if having diarrhea  . traMADol (ULTRAM) 50 MG tablet Take 1 tablet (50 mg total) by mouth every 6 (six) hours as needed for severe pain. For AFTER surgery, do not take and drive (Patient not taking: Reported on 10/29/2020)   No facility-administered encounter medications on file as of 11/02/2020.    Allergy:  Allergies  Allergen Reactions  . Acarbose   . Atenolol     Stomach problems, chills  . Cinnamon     Stomach pain  . Glimepiride     ALL DIABETIC MEDS PER PT   . Invokana [Canagliflozin]     ALL DIABETIC MEDS  PER PT   . Januvia [Sitagliptin]     ALL DIABETIC MEDS PER PT   . Losartan Potassium   . Metformin And Related Nausea And Vomiting and Other (See Comments)    Patient states that she has severe chills, headache and cramping additionally. Says blood sugar is uncontrolled because of this  . Other Other (See Comments)    ALL DIABETIC MEDS PER PT   . Pioglitazone Other (See Comments)    ALL DIABETIC MEDS PER PT   . Statins     Myalgia  . Sulfamethoxazole Other (See Comments)    ALL DIABETIC MEDS PER PT   . Tramadol Hcl Itching  . Sulfa Antibiotics Rash and Other (See Comments)    Mother has told patient in the past not to take, she cannot recall there  reaction    Social Hx:   Social History   Socioeconomic History  . Marital status: Divorced    Spouse name: Not on file  . Number of children: 3  . Years of education: 38  . Highest education level: Not on file  Occupational History  . Occupation: Retired   Tobacco Use  . Smoking status: Never Smoker  . Smokeless tobacco: Never Used  Vaping Use  . Vaping Use: Never used  Substance and Sexual Activity  . Alcohol use: No    Comment: quit 1978  . Drug use: No  . Sexual activity: Not Currently  Other Topics Concern  . Not on file  Social History Narrative   Lives at home. Her [name] lives with her.    Right handed   Social Determinants of Health   Financial Resource Strain:   . Difficulty of Paying Living Expenses: Not on file  Food Insecurity:   . Worried About Charity fundraiser in the Last Year: Not on file  . Ran Out of Food in the Last Year: Not on file  Transportation Needs:   . [name] of Transportation (Medical): Not on file  . [name] of Transportation (Non-Medical): Not on file  Physical Activity:   . Days of Exercise per Week: Not on file  . Minutes of Exercise per Session: Not on file  Stress:   . Feeling of Stress : Not on file  Social Connections:   . Frequency of Communication with Friends and Family: Not on file  . Frequency of Social Gatherings with Friends and Family: Not on file  . Attends Religious Services: Not on file  . Active Member of Clubs or Organizations: Not on file  . Attends Archivist Meetings: Not on file  . Marital Status: Not on file  Intimate Partner Violence:   . Fear of Current or Ex-Partner: Not on file  . Emotionally Abused: Not on file  . Physically Abused: Not on file  . Sexually Abused: Not on file    Past Surgical Hx:  Past Surgical History:  Procedure Laterality Date  . CHOLECYSTECTOMY  1983  . COLONOSCOPY  2008   DB  . [place] OF UTERUS  1972  . HYSTEROSCOPY WITH D & C  06/29/2011    Procedure: DILATATION AND CURETTAGE (D&C) /HYSTEROSCOPY;  Surgeon: Donnamae Jude, MD;  Location: [place] [facility];  Service: Gynecology;  Laterality: N/A;  . ROBOTIC ASSISTED TOTAL HYSTERECTOMY WITH BILATERAL SALPINGO OOPHERECTOMY N/A 10/27/2020   Procedure: XI ROBOTIC ASSISTED TOTAL HYSTERECTOMY WITH BILATERAL SALPINGO OOPHORECTOMY;  Surgeon: Everitt Amber, MD;  Location: [facility];  Service: Gynecology;  Laterality: N/A;  . SENTINEL  NODE BIOPSY N/A 10/27/2020   Procedure: SENTINEL NODE BIOPSY;  Surgeon: Everitt Amber, MD;  Location: [facility];  Service: Gynecology;  Laterality: N/A;    Past Medical Hx:  Past Medical History:  Diagnosis Date  . Allergy   . Anemia   . Arthritis   . Asthma    Dianosed years ago-no meds at this time  . Cancer [facility])    endometrial   . Cataract    Right eye removed-still has left cataract   . Chronic kidney disease 1997   Nepthrotic syndrom with minimal change-in remission per pt - last seen by kidney MD- 2 mos ago ? name of MD   . Diabetes mellitus    type 2   . Dyspnea    with exertion   . Dysrhythmia    "skips beats"  . Family history of adverse reaction to anesthesia    son stopped breathing during surgery , mother slow to wake up   . Fibromyalgia 1980's  . Generalized anxiety disorder   . GERD (gastroesophageal reflux disease)    occ- will use Tums and Prolosec  prn   . Heart murmur   . Hyperlipemia   . Hypertension    not on blood pressure meds due to allergies - last dose of meds- months ago   . Hypothyroidism   . Irritable bowel syndrome 1980's  . Major depressive disorder   . Migraine headaches 1980's   On meds-well controlled  . 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  . Morbid obesity ([place])   . Pneumonia 2009   Several times over the past several years  . PONV (postoperative nausea and vomiting)    was told by neurologist due to calcificaton in brain not to be put to sleep, N/V during Op and Recovery   . Recurrent upper respiratory infection (URI)    . Sleep apnea    no cpap  . Subarachnoid hemorrhage ([place]) 2010  . Vaginal polyp    benign per pt    Past Gynecological History:  P3 No LMP recorded. Patient is postmenopausal.  Family Hx:  Family History  Problem Relation Age of Onset  . Liver cancer Father        mets to liver   . Bladder Cancer Father   . Colon cancer Father        mets to colon   . Anesthesia problems Mother        hard to wake post op   . Cerebrovascular Disease Mother        Never had stroke, but had CEA.  . High blood pressure Other   . Diabetes Other   . Arthritis Other   . Anesthesia problems Son        stopped breathing post op   . Tremor Son   . Schizophrenia Daughter        Schizoaffective disorder  . Anesthesia problems [name]        PONV  . Dementia Neg Hx   . Esophageal cancer Neg Hx   . Rectal cancer Neg Hx   . Stomach cancer Neg Hx   . Colon polyps Neg Hx     Review of Systems:  Constitutional  Feels weak  ENT Normal appearing ears and nares bilaterally Skin/Breast  No rash, sores, jaundice, itching, dryness Cardiovascular  + arrhythmia Pulmonary  No cough or wheeze.  Gastro Intestinal  No nausea, vomitting, or diarrhoea. No bright red blood per rectum, no abdominal pain, change in bowel movement, or  constipation.  Genito Urinary  No frequency, urgency, dysuria, + bleeding Musculo Skeletal  + arthritis Neurologic  + tremor and word finding difficulty, + left facial weakness Psychology  + anxiety  Vitals:  There were no vitals taken for this visit.  Physical Exam: deferred  I discussed the assessment and treatment plan with the patient. The patient was provided with an opportunity to ask questions and all were answered. The patient agreed with the plan and demonstrated an understanding of the instructions.   The patient was advised to call back or see an in-person evaluation if the symptoms worsen or if the condition fails to improve as anticipated.   I  provided 30 minutes of non face-to-face telephone visit time during this encounter, and > 50% was spent counseling as documented under my assessment & plan.   Thereasa Solo, MD  11/02/2020, 2:29 PM

## 2020-11-03 ENCOUNTER — Ambulatory Visit: Payer: Medicare HMO | Admitting: Gynecologic Oncology

## 2020-11-04 ENCOUNTER — Telehealth: Payer: Self-pay

## 2020-11-04 NOTE — Telephone Encounter (Signed)
[name] states that she is not feeling well today.She has had more chills then usual for her. Temp this am 96.4. Yesterday she began to experience intermittent nausea no vomiting. She is eating well,especially yesterday. She is passing gas. She hears gurgling sounds in abdomen. She has not had a BM since last Sunday when given Mag citrate in the hospital.  This resulted in a lot of diarrhea pt reports. She has throbbing abdominal pain in abdomen. Pain is greater on the left side of the abdomen.  Pain is 5/10.  She is using tylenol for pain.  She has not taken any this am. Last dose was last night with 1000 mg of  Tylenol.  She slept well. She had not taken tramadol for pain as she does not want to get constipated.  She is not using the Senakot-S or the miralax as this does not help her move her bowels.  [name] states that she does not feel that she is constipated. She continues with vaginal bleeding. Blood is brownish /Tan. She is changing her pad 1-3 times a day. She is on the ASA 81 mg. She is not taking Plavix until bleeding has stopped. Reviewed with Melissa Cross,NP.

## 2020-11-04 NOTE — Telephone Encounter (Signed)
Told [name] that she needs to get her bowels moving. It has been 5 days since her last BM. Told her to take 1 capful of Miralax bid today.  If no BM by mid day tomorrow, she needs to drink a 1/2 bottle of Mag citrate.  If no BM in 4 hours she needs to repeat 1/2 a bottle of Mag citrate.  Pt wrote instructions down and agreed to the plan.  She has the after hours number [phone] if she has any questions or concerns over the weekend.

## 2020-11-08 ENCOUNTER — Encounter (HOSPITAL_COMMUNITY): Payer: Self-pay | Admitting: Gynecologic Oncology

## 2020-11-08 ENCOUNTER — Encounter: Payer: Self-pay | Admitting: Oncology

## 2020-11-08 NOTE — Progress Notes (Signed)
Requested MLH1 hypomethylation and MSI testing on accession [id number] with [facility] via email.

## 2020-11-09 ENCOUNTER — Telehealth: Payer: Self-pay

## 2020-11-09 DIAGNOSIS — D510 Vitamin B12 deficiency anemia due to intrinsic factor deficiency: Secondary | ICD-10-CM | POA: Diagnosis not present

## 2020-11-09 NOTE — Telephone Encounter (Signed)
[name] said that she needs to give herself the Evolocumab sq in her belly. She gives it to herself every 2 weeks for cholesterol. She wanted to know if she can inject it a couple of inches from her navel as she usually does. Told her that with recent surgery and healing  That she should inject med into Subcutaneous tissue on the side of the abdomen commonly known as "love handles". Pt verbalized understanding.

## 2020-11-14 ENCOUNTER — Ambulatory Visit: Payer: Medicare HMO | Admitting: Physician Assistant

## 2020-11-17 ENCOUNTER — Inpatient Hospital Stay: Payer: Medicare HMO | Attending: Gynecologic Oncology | Admitting: Gynecologic Oncology

## 2020-11-17 ENCOUNTER — Ambulatory Visit (INDEPENDENT_AMBULATORY_CARE_PROVIDER_SITE_OTHER): Payer: Medicare HMO | Admitting: Physician Assistant

## 2020-11-17 ENCOUNTER — Encounter: Payer: Self-pay | Admitting: Physician Assistant

## 2020-11-17 ENCOUNTER — Other Ambulatory Visit: Payer: Self-pay

## 2020-11-17 ENCOUNTER — Encounter: Payer: Self-pay | Admitting: Gynecologic Oncology

## 2020-11-17 VITALS — BP 154/84 | HR 96 | Temp 99.0°F | Resp 20 | Ht 62.25 in | Wt 174.0 lb

## 2020-11-17 VITALS — BP 138/74 | HR 89 | Ht 62.25 in | Wt 175.0 lb

## 2020-11-17 DIAGNOSIS — Z90722 Acquired absence of ovaries, bilateral: Secondary | ICD-10-CM

## 2020-11-17 DIAGNOSIS — C541 Malignant neoplasm of endometrium: Secondary | ICD-10-CM | POA: Diagnosis not present

## 2020-11-17 DIAGNOSIS — K580 Irritable bowel syndrome with diarrhea: Secondary | ICD-10-CM | POA: Diagnosis not present

## 2020-11-17 DIAGNOSIS — Z86010 Personal history of colonic polyps: Secondary | ICD-10-CM

## 2020-11-17 DIAGNOSIS — Z90710 Acquired absence of both cervix and uterus: Secondary | ICD-10-CM | POA: Diagnosis not present

## 2020-11-17 DIAGNOSIS — Z7189 Other specified counseling: Secondary | ICD-10-CM

## 2020-11-17 NOTE — Patient Instructions (Signed)
If you are age 71 or older, your body mass index should be between 23-30. Your Body mass index is 31.75 kg/m. If this is out of the aforementioned range listed, please consider follow up with your Primary Care Provider.  If you are age 36 or younger, your body mass index should be between 19-25. Your Body mass index is 31.75 kg/m. If this is out of the aformentioned range listed, please consider follow up with your Primary Care Provider.   Call the the office and as to speak with Dr. Woodward Ku nurse, Eustaquio Maize, if you wish to try Xifaxin.  Thank you for entrusting me with your care and choosing [facility]  Amy Esterwood, PA-C

## 2020-11-17 NOTE — Patient Instructions (Signed)
Please notify Dr Denman George at phone number [phone] if you notice vaginal bleeding, new pelvic or abdominal pains, bloating, feeling full easy, or a change in bladder or bowel function.   Your cancer was a stage 1 cancer with low risk features and no additional treatment is recommended.  Please return to see Dr Denman George in 6 months.  You can restart your adult aspirin and not take the plavix, or take the plavix with the baby aspirin.

## 2020-11-17 NOTE — Progress Notes (Signed)
Subjective:    Patient ID: [name], female    DOB: 09/09/1951, 69 y.o.   MRN: [id number]  HPI  [name] is a pleasant 69 year old female, established with [name] who comes in today for follow-up and with some recent issues with constipation. 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  She had colonoscopy in June 2019 with removal of 2 small sessile polyps , 1 of which was a tubular adenoma, also noted to have diverticulosis and internal hemorrhoids and she is indicated for 5-year interval follow-up. Patient has very recently been diagnosed with endometrial cancer stage Ia grade 1 and underwent hysterectomy BSO and lymph node sampling on 10/27/2020.  This was done laparoscopic.  Fortunately early stage, and per GYN notes does not require any adjuvant therapy. Postoperatively she did develop a left facial droop, required readmission and underwent neurologic work-up.Marland Kitchen  MRI of the brain was negative for acute abnormalities, CT scan showed chronic calcifications and mild chronic small vessel vascular disease but no acute abnormality.  Carotid ultrasound and CT of the neck showing 75% stenosis of the right ICA.  Patient was to start Plavix, but was told not to start this until she was no longer having any vaginal bleeding postoperatively.  She says today that she has not started the Plavix as yet. After discharge from the hospital she apparently did not have a bowel movement for a few days and had called.  There were concerns that she was having significant constipation and she was instructed to take a bottle of mag citrate.  She says she did not have any results from that for over 24 hours but is now starting to have more normal bowel movements.  She says she had started noticing a little bit of constipation prior to surgery. Patient says she does not really feel that she was constipated she just had gone to the bathroom for a few  days. She is pleased that she has not been having diarrhea like she had previously from her IBS.  She mentions that she occasionally will have a twinge of some rectal discomfort which is transient. Other medical issues include adult onset diabetes mellitus, coronary artery disease, chronic kidney disease and Fahrs  Syndrome.  Patient is asking whether she should have another colonoscopy because of her diagnosis of endometrial cancer.  Review of Systems Pertinent positive and negative review of systems were noted in the above HPI section.  All other review of systems was otherwise negative.  Outpatient Encounter Medications as of 11/17/2020  Medication Sig  . acetaminophen (TYLENOL) 500 MG tablet Take 500 mg by mouth every 6 (six) hours as needed for moderate pain.  Marland Kitchen amitriptyline (ELAVIL) 10 MG tablet Take 10 mg by mouth at bedtime.   Marland Kitchen aspirin EC 81 MG tablet Take 1 tablet (81 mg total) by mouth daily. Swallow whole.  . bumetanide (BUMEX) 1 MG tablet Take 1 mg by mouth every morning.  Marland Kitchen buPROPion (WELLBUTRIN XL) 150 MG 24 hr tablet Take 1 tablet (150 mg total) by mouth daily.  . cyanocobalamin (,VITAMIN B-12,) 1000 MCG/ML injection Inject 1,000 mcg into the muscle every 30 (thirty) days.   . Evolocumab (REPATHA SURECLICK) 941 MG/ML SOAJ Inject 1 pen into the skin every 14 (fourteen) days.  Marland Kitchen levothyroxine (SYNTHROID, LEVOTHROID) 50 MCG tablet Take 50 mcg by mouth daily.   . pantoprazole (PROTONIX) 40 MG tablet Take 1 tablet (40 mg total) by  mouth daily.  . Potassium 99 MG TABS Take 198 mg by mouth daily.  . clopidogrel (PLAVIX) 75 MG tablet Take 1 tablet (75 mg total) by mouth daily.  . [DISCONTINUED] hydrOXYzine (ATARAX/VISTARIL) 10 MG tablet Take 1 tablet (10 mg total) by mouth 3 (three) times daily as needed for itching or anxiety.  . [DISCONTINUED] senna-docusate (SENOKOT-S) 8.6-50 MG tablet Take 2 tablets by mouth at bedtime. For AFTER surgery, do not take if having diarrhea  .  [DISCONTINUED] traMADol (ULTRAM) 50 MG tablet Take 1 tablet (50 mg total) by mouth every 6 (six) hours as needed for severe pain. For AFTER surgery, do not take and drive (Patient not taking: Reported on 10/29/2020)   No facility-administered encounter medications on file as of 11/17/2020.   Allergies  Allergen Reactions  . Acarbose   . Atenolol     Stomach problems, chills  . Cinnamon     Stomach pain  . Glimepiride     ALL DIABETIC MEDS PER PT   . Invokana [Canagliflozin]     ALL DIABETIC MEDS PER PT   . Januvia [Sitagliptin]     ALL DIABETIC MEDS PER PT   . Losartan Potassium   . Metformin And Related Nausea And Vomiting and Other (See Comments)    Patient states that she has severe chills, headache and cramping additionally. Says blood sugar is uncontrolled because of this  . Other Other (See Comments)    ALL DIABETIC MEDS PER PT   . Pioglitazone Other (See Comments)    ALL DIABETIC MEDS PER PT   . Statins     Myalgia  . Sulfamethoxazole Other (See Comments)    ALL DIABETIC MEDS PER PT   . Tramadol Hcl Itching  . Sulfa Antibiotics Rash and Other (See Comments)    Mother has told patient in the past not to take, she cannot recall there reaction   Patient Active Problem List   Diagnosis Date Noted  . Anxiety and depression 10/30/2020  . Carotid artery stenosis 10/30/2020  . Facial droop 10/29/2020  . Fahr's syndrome ([place]) 10/29/2020  . HLD (hyperlipidemia) 10/29/2020  . Endometrial cancer ([place]) 10/18/2020  . CAD (coronary artery disease) 05/11/2020  . Acute encephalopathy 02/26/2020  . Hypothyroidism   . 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  . Essential tremor 06/24/2018  . Migraine without aura 06/24/2018  . Stress at home 08/28/2015  . Post-menopausal bleeding 06/16/2011  . Kidney disease 06/16/2011  . Colon polyp 06/16/2011  . Vaginal polyp 06/16/2011  . Chronic diarrhea 06/16/2011  . Diverticulitis 06/16/2011  . DUB (dysfunctional uterine bleeding)  06/16/2011  . Fatty liver 06/16/2011  . Diabetes mellitus type II, non insulin dependent ([place]) 06/16/2011   Social History   Socioeconomic History  . Marital status: Divorced    Spouse name: Not on file  . Number of children: 3  . Years of education: 47  . Highest education level: Not on file  Occupational History  . Occupation: Retired   Tobacco Use  . Smoking status: Never Smoker  . Smokeless tobacco: Never Used  Vaping Use  . Vaping Use: Never used  Substance and Sexual Activity  . Alcohol use: No    Comment: quit 1978  . Drug use: No  . Sexual activity: Not Currently  Other Topics Concern  . Not on file  Social History Narrative   Lives at home. Her granddaughter lives with her.    Right handed   Social Determinants of Radio broadcast assistant  Strain:   . Difficulty of Paying Living Expenses: Not on file  Food Insecurity:   . Worried About Charity fundraiser in the Last Year: Not on file  . Ran Out of Food in the Last Year: Not on file  Transportation Needs:   . Lack of Transportation (Medical): Not on file  . Lack of Transportation (Non-Medical): Not on file  Physical Activity:   . Days of Exercise per Week: Not on file  . Minutes of Exercise per Session: Not on file  Stress:   . Feeling of Stress : Not on file  Social Connections:   . Frequency of Communication with Friends and Family: Not on file  . Frequency of Social Gatherings with Friends and Family: Not on file  . Attends Religious Services: Not on file  . Active Member of Clubs or Organizations: Not on file  . Attends Archivist Meetings: Not on file  . Marital Status: Not on file  Intimate Partner Violence:   . Fear of Current or Ex-Partner: Not on file  . Emotionally Abused: Not on file  . Physically Abused: Not on file  . Sexually Abused: Not on file    Ms. Alms's family history includes Anesthesia problems in her granddaughter, mother, and son; Arthritis in an other family  member; Bladder Cancer in her father; Cerebrovascular Disease in her mother; Colon cancer in her father; Diabetes in an other family member; High blood pressure in an other family member; Liver cancer in her father; Schizophrenia in her daughter; Tremor in her son.      Objective:    Vitals:   11/17/20 1126  BP: 138/74  Pulse: 89  SpO2: 99%    Physical Exam Well-developed well-nourished older WF in no acute distress.  Height, ZYYQMG,500  BMI31.5  HEENT; nontraumatic normocephalic, EOMI, PE RR LA, sclera anicteric. Oropharynx;not examined Neck; supple, no JVD Cardiovascular; regular rate and rhythm with S1-S2, no murmur rub or gallop Pulmonary; Clear bilaterally Abdomen; soft, nontender, nondistended, no palpable mass or hepatosplenomegaly, bowel sounds are active, lap ports healing  Rectal;not done Skin; benign exam, no jaundice rash or appreciable lesions Extremities; no clubbing cyanosis or edema skin warm and dry Neuro/Psych; alert and oriented x4, grossly nonfocal mood and affect appropriate       Assessment & Plan:   #4 69 year old white female with history of IBS, diarrhea predominant with recent episode of mild constipation postoperatively.  Bowel movements back to normal now with occasional loose stool. #2 history of adenomatous colon polyps-up-to-date with colonoscopy last done June 2019 with finding of 1 small tubular adenoma and 1 hyperplastic polyp.. Indicated for 5-year interval follow-up which will be June 2024.  #3 new diagnosis of endometrial cancer November 2021 status post laparoscopic hysterectomy BSO and lymph node biopsy, found to have stage Ia grade 1 endometrial carcinoma, no need for adjuvant therapy  #4 recent TIA postop November 2021, MRI negative, she does have 75% right ICA stenosis and is to start on Plavix, which she has not initiated as yet.  #5 adult onset diabetes mellitus 6.  Coronary artery disease 7.  Chronic kidney disease #8  GERD-stable  Plan; patient does not need repeat colonoscopy at this time, she will be due for repeat colonoscopy in 2024 Continue Protonix 40 mg p.o. every morning Patient has been treated in the past with Xifaxan for IBS-D/probable SIBO and did have response.  She is encouraged to call should she have recurrence of diarrhea over the next few  months, and at that point we would retreat with a course of Xifaxan. We will plan office follow-up in 3 to 4 months with [name].     Glenn Christo Genia Harold PA-C 11/17/2020   Cc: Hulan Fess, MD

## 2020-11-17 NOTE — Progress Notes (Signed)
Gynecologic Oncology Follow-up  Chief Complaint  Patient presents with  . Endometrial cancer ([place])  . Counseling and coordination of care    Assessment/Plan:  [name]  is a 69 y.o.  year old P3 with stage IA grade 1 endometrial cancer, MMR deficient, (hypermethylation present), s/p surgical staging on 10/27/20 . Pathology revealed low risk factors for recurrence, therefore no adjuvant therapy is recommended according to NCCN guidelines.  I discussed risk for recurrence and typical symptoms encouraged her to notify us of these should they develop between visits.  I recommend she have follow-up every 6 months for 5 years in accordance with NCCN guidelines. Those visits should include symptom assessment, physical exam and pelvic examination. Pap smears are not indicated or recommended in the routine surveillance of endometrial cancer.  I am recommending starting plavix no sooner than 1 week postop AND when there is no more vaginal bleeding given her ambiguous neuro symptoms/exam from her recent hospitalization and the high risk for postop bleeding complications.    HPI: [name] is a 69 year old P 3 who was seen in consultation at the request of Dr Rosana Hoes for evaluation and treatment of grade 1 endometrial cancer (MMR abnormal).   Her symptoms began with postmenopausal bleeding in late September 2021.  She saw Dr Rosana Hoes for a new patient consultation on 08/26/20.  Work-up of symptoms included transvaginal US, and biopsy. Transvaginal US on 09/01/20 showed a uterus measuring 9.4 x 3.6 x 5.4 cm with an endometrial thickness of 6 mm.  The ovaries were grossly normal bilaterally. Endometrial sampling with endometrial pipelle biopsy was performed on 09/19/20 and showed 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   The patient's medical history is remarkable for a fall in 2010 which resulted in a subarachnoid  hemorrhage and a head injury.  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  She has been having issues in the recent year with word finding difficulty and weakness for which she has had extensive neurologic work-up.  This included head imaging and it was felt that she had progressive calcifications in her brain.  She had been told in the past that she could not have general anesthesia due to her prior brain injury.  Additionally she is known to have coronary artery calcifications consistent with coronary artery disease in addition to history of a dysrhythmia for which she sees Dr. Irish Lack.   She has a history of nephrotic syndrome and chronic kidney disease.  She has a history of diabetes mellitus, type II, for which she was prescribed multiple oral antiglycemic's in the past none of which she tolerated.  She declined taking insulin.  Her last hemoglobin A1c was 6.9% in April 2021.  In addition to the above-stated issues she has hypothyroidism hypertension hyperlipidemia gastroesophageal reflux and asthma.    She lives with her granddaughter who is 10-1/2 years old.  MRI was performed of the pelvis on 10/10/2020 and showed a deeply invasive myometrial extending tumor from the endometrium.  There was no apparent lymphadenopathy or extrauterine disease present, however this appeared to be at least clinical stage Ib.  She had seen her cardiologist for optimization of her stratification and was felt to be a good candidate for surgery by him.  Her neurologist was contacted regarding candidacy for surgery and Trendelenburg positioning with her prior intracranial calcifications.  Her neurologist declined to comment on risk for this.  She decided to proceed  with definitive surgical staging.  On 10/27/20 she underwent robotic assisted total hysterectomy, BSO, SLN biopsy. Intraoperative findings were significant for a 10cm fibroid uterus with anterior prolapse, suspicious left external  iliac SLN, normal peritoneum. Surgery was uncomplicated.  Final pathology revealed a FIGO grade 1 endometrioid adenocarcinoma of the endometrium, there was 2 of 49m myometrial invasion, no LVSI and negative cervix and adnexa and lymph nodes. MMR testing showed loss of expression of MLH1 and PMS2. 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   Following surgery she developed symptoms of [facility] left [facility]  Comprehensive neurologic testing failed to demonstrate evidence of a CVA. She was empirically prescribed plavix, however counseled to avoid this in at least the first week after surgery.   Interval Hx: She reported to me that she was having persistent light vaginal bleeding since surgery.    Current Meds:  Outpatient Encounter Medications as of 11/17/2020  Medication Sig  . acetaminophen (TYLENOL) 500 MG tablet Take 500 mg by mouth every 6 (six) hours as needed for moderate pain.  .Marland Kitchenamitriptyline (ELAVIL) 10 MG tablet Take 10 mg by mouth at bedtime.   .Marland Kitchenaspirin EC 81 MG tablet Take 1 tablet (81 mg total) by mouth daily. Swallow whole.  . bumetanide (BUMEX) 1 MG tablet Take 1 mg by mouth every morning.  .Marland KitchenbuPROPion (WELLBUTRIN XL) 150 MG 24 hr tablet Take 1 tablet (150 mg total) by mouth daily.  . clopidogrel (PLAVIX) 75 MG tablet Take 1 tablet (75 mg total) by mouth daily.  . cyanocobalamin (,VITAMIN B-12,) 1000 MCG/ML injection Inject 1,000 mcg into the muscle every 30 (thirty) days.   . Evolocumab (REPATHA SURECLICK) 1342MG/ML SOAJ Inject 1 pen into the skin every 14 (fourteen) days.  .Marland Kitchenlevothyroxine (SYNTHROID, LEVOTHROID) 50 MCG tablet Take 50 mcg by mouth daily.   . pantoprazole (PROTONIX) 40 MG tablet Take 1 tablet (40 mg total) by mouth daily.  . Potassium 99 MG TABS Take 198 mg by mouth daily.  . [DISCONTINUED] hydrOXYzine (ATARAX/VISTARIL) 10 MG tablet Take 1 tablet (10 mg total) by mouth 3 (three) times daily as  needed for itching or anxiety.  . [DISCONTINUED] senna-docusate (SENOKOT-S) 8.6-50 MG tablet Take 2 tablets by mouth at bedtime. For AFTER surgery, do not take if having diarrhea  . [DISCONTINUED] traMADol (ULTRAM) 50 MG tablet Take 1 tablet (50 mg total) by mouth every 6 (six) hours as needed for severe pain. For AFTER surgery, do not take and drive (Patient not taking: Reported on 10/29/2020)   No facility-administered encounter medications on file as of 11/17/2020.    Allergy:  Allergies  Allergen Reactions  . Acarbose   . Atenolol     Stomach problems, chills  . Cinnamon     Stomach pain  . Glimepiride     ALL DIABETIC MEDS PER PT   . Invokana [Canagliflozin]     ALL DIABETIC MEDS PER PT   . Januvia [Sitagliptin]     ALL DIABETIC MEDS PER PT   . Losartan Potassium   . Metformin And Related Nausea And Vomiting and Other (See Comments)    Patient states that she has severe chills, headache and cramping additionally. Says blood sugar is uncontrolled because of this  . Other Other (See Comments)    ALL DIABETIC MEDS PER PT   . Pioglitazone Other (See Comments)    ALL DIABETIC MEDS PER PT   . Statins  Myalgia  . Sulfamethoxazole Other (See Comments)    ALL DIABETIC MEDS PER PT   . Tramadol Hcl Itching  . Sulfa Antibiotics Rash and Other (See Comments)    Mother has told patient in the past not to take, she cannot recall there reaction    Social Hx:   Social History   Socioeconomic History  . Marital status: Divorced    Spouse name: Not on file  . Number of children: 3  . Years of education: 2  . Highest education level: Not on file  Occupational History  . Occupation: Retired   Tobacco Use  . Smoking status: Never Smoker  . Smokeless tobacco: Never Used  Vaping Use  . Vaping Use: Never used  Substance and Sexual Activity  . Alcohol use: No    Comment: quit 1978  . Drug use: No  . Sexual activity: Not Currently  Other Topics Concern  . Not on file  Social  History Narrative   Lives at home. Her granddaughter lives with her.    Right handed   Social Determinants of Health   Financial Resource Strain:   . Difficulty of Paying Living Expenses: Not on file  Food Insecurity:   . Worried About Charity fundraiser in the Last Year: Not on file  . Ran Out of Food in the Last Year: Not on file  Transportation Needs:   . Lack of Transportation (Medical): Not on file  . Lack of Transportation (Non-Medical): Not on file  Physical Activity:   . Days of Exercise per Week: Not on file  . Minutes of Exercise per Session: Not on file  Stress:   . Feeling of Stress : Not on file  Social Connections:   . Frequency of Communication with Friends and Family: Not on file  . Frequency of Social Gatherings with Friends and Family: Not on file  . Attends Religious Services: Not on file  . Active Member of Clubs or Organizations: Not on file  . Attends Archivist Meetings: Not on file  . Marital Status: Not on file  Intimate Partner Violence:   . Fear of Current or Ex-Partner: Not on file  . Emotionally Abused: Not on file  . Physically Abused: Not on file  . Sexually Abused: Not on file    Past Surgical Hx:  Past Surgical History:  Procedure Laterality Date  . CHOLECYSTECTOMY  1983  . COLONOSCOPY  2008   DB  . [place] OF UTERUS  1972  . HYSTEROSCOPY WITH D & C  06/29/2011   Procedure: DILATATION AND CURETTAGE (D&C) /HYSTEROSCOPY;  Surgeon: Donnamae Jude, MD;  Location: [facility];  Service: Gynecology;  Laterality: N/A;  . ROBOTIC ASSISTED TOTAL HYSTERECTOMY WITH BILATERAL SALPINGO OOPHERECTOMY N/A 10/27/2020   Procedure: XI ROBOTIC ASSISTED TOTAL HYSTERECTOMY WITH BILATERAL SALPINGO OOPHORECTOMY;  Surgeon: Everitt Amber, MD;  Location: [facility];  Service: Gynecology;  Laterality: N/A;  . SENTINEL NODE BIOPSY N/A 10/27/2020   Procedure: SENTINEL NODE BIOPSY;  Surgeon: Everitt Amber, MD;  Location: [facility];  Service: Gynecology;   Laterality: N/A;    Past Medical Hx:  Past Medical History:  Diagnosis Date  . Allergy   . Anemia   . Arthritis   . Asthma    Dianosed years ago-no meds at this time  . Cancer [facility]) 2021   endometrial   . Cataract    Right eye removed-still has left cataract   . Chronic kidney disease 1997  Nepthrotic syndrom with minimal change-in remission per pt - last seen by kidney MD- 2 mos ago ? name of MD   . Diabetes mellitus    type 2   . Dyspnea    with exertion   . Dysrhythmia    "skips beats"  . Family history of adverse reaction to anesthesia    son stopped breathing during surgery , mother slow to wake up   . Fibromyalgia 1980's  . Generalized anxiety disorder   . GERD (gastroesophageal reflux disease)    occ- will use Tums and Prolosec  prn   . Heart murmur   . Hyperlipemia   . Hypertension    not on blood pressure meds due to allergies - last dose of meds- months ago   . Hypothyroidism   . Irritable bowel syndrome 1980's  . Major depressive disorder   . Migraine headaches 1980's   On meds-well controlled  . 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  . Morbid obesity ([place])   . Pneumonia 2009   Several times over the past several years  . PONV (postoperative nausea and vomiting)    was told by neurologist due to calcificaton in brain not to be put to sleep, N/V during Op and Recovery   . Recurrent upper respiratory infection (URI)   . Sleep apnea    no cpap  . Subarachnoid hemorrhage ([place]) 2010  . Vaginal polyp    benign per pt    Past Gynecological History:  P3 No LMP recorded. Patient is postmenopausal.  Family Hx:  Family History  Problem Relation Age of Onset  . Liver cancer Father        mets to liver   . Bladder Cancer Father   . Colon cancer Father        mets to colon   . Anesthesia problems Mother        hard to wake post op   . Cerebrovascular Disease Mother        Never had stroke, but had CEA.  . High blood pressure Other   . Diabetes Other    . Arthritis Other   . Anesthesia problems Son        stopped breathing post op   . Tremor Son   . Schizophrenia Daughter        Schizoaffective disorder  . Anesthesia problems Granddaughter        PONV  . Dementia Neg Hx   . Esophageal cancer Neg Hx   . Rectal cancer Neg Hx   . Stomach cancer Neg Hx   . Colon polyps Neg Hx     Review of Systems:  Constitutional  Feels weak  ENT Normal appearing ears and nares bilaterally Skin/Breast  No rash, sores, jaundice, itching, dryness Cardiovascular  + arrhythmia Pulmonary  No cough or wheeze.  Gastro Intestinal  No nausea, vomitting, or diarrhoea. No bright red blood per rectum, no abdominal pain, change in bowel movement, or constipation.  Genito Urinary  No frequency, urgency, dysuria, + bleeding Musculo Skeletal  + arthritis Neurologic  + tremor and word finding difficulty, + left facial weakness Psychology  + anxiety  Vitals:  Blood pressure (!) 154/84, pulse 96, temperature 99 F (37.2 C), temperature source Tympanic, resp. rate 20, height 5' 2.25" (1.581 m), weight 174 lb (78.9 kg), SpO2 100 %.  Physical Exam: WD in NAD Neck  Supple NROM, without any enlargements.  Lymph Node Survey No cervical supraclavicular or inguinal adenopathy Cardiovascular  Pulse normal rate,  regularity and rhythm. S1 and S2 normal.  Lungs  Clear to auscultation bilateraly, without wheezes/crackles/rhonchi. Good air movement.  Skin  No rash/lesions/breakdown  Psychiatry  Alert and oriented to person, place, and time  Abdomen  Normoactive bowel sounds, abdomen soft, non-tender and obese without evidence of hernia. Well healed incisions.  Back No CVA tenderness Genito Urinary  Vaginal cuff healing normally. Gapping slightly between middle sutures with mucosa seen but no active bleeding and no dehiscence.  Extremities  No bilateral cyanosis, clubbing or edema.   30 minutes of direct face to face counseling time was spent with the  patient. This included discussion about prognosis, therapy recommendations and postoperative side effects and are beyond the scope of routine postoperative care.   Thereasa Solo, MD  11/17/2020, 2:32 PM

## 2020-11-21 DIAGNOSIS — F411 Generalized anxiety disorder: Secondary | ICD-10-CM | POA: Diagnosis not present

## 2020-11-21 DIAGNOSIS — F329 Major depressive disorder, single episode, unspecified: Secondary | ICD-10-CM | POA: Diagnosis not present

## 2020-11-23 NOTE — Progress Notes (Signed)
Reviewed and agree with documentation and assessment and plan. K. Veena Garrit Marrow , MD   

## 2020-11-28 DIAGNOSIS — E1169 Type 2 diabetes mellitus with other specified complication: Secondary | ICD-10-CM | POA: Diagnosis not present

## 2020-11-28 DIAGNOSIS — N189 Chronic kidney disease, unspecified: Secondary | ICD-10-CM | POA: Diagnosis not present

## 2020-11-28 DIAGNOSIS — E1129 Type 2 diabetes mellitus with other diabetic kidney complication: Secondary | ICD-10-CM | POA: Diagnosis not present

## 2020-11-28 DIAGNOSIS — E039 Hypothyroidism, unspecified: Secondary | ICD-10-CM | POA: Diagnosis not present

## 2020-11-28 DIAGNOSIS — E1165 Type 2 diabetes mellitus with hyperglycemia: Secondary | ICD-10-CM | POA: Diagnosis not present

## 2020-11-28 DIAGNOSIS — E782 Mixed hyperlipidemia: Secondary | ICD-10-CM | POA: Diagnosis not present

## 2020-11-28 DIAGNOSIS — E785 Hyperlipidemia, unspecified: Secondary | ICD-10-CM | POA: Diagnosis not present

## 2020-11-28 DIAGNOSIS — E1122 Type 2 diabetes mellitus with diabetic chronic kidney disease: Secondary | ICD-10-CM | POA: Diagnosis not present

## 2020-11-28 DIAGNOSIS — I10 Essential (primary) hypertension: Secondary | ICD-10-CM | POA: Diagnosis not present

## 2020-12-01 ENCOUNTER — Inpatient Hospital Stay: Payer: Self-pay | Admitting: Adult Health

## 2020-12-13 DIAGNOSIS — D510 Vitamin B12 deficiency anemia due to intrinsic factor deficiency: Secondary | ICD-10-CM | POA: Diagnosis not present

## 2021-01-03 ENCOUNTER — Inpatient Hospital Stay: Payer: Self-pay | Admitting: Adult Health

## 2021-01-05 ENCOUNTER — Emergency Department (HOSPITAL_BASED_OUTPATIENT_CLINIC_OR_DEPARTMENT_OTHER): Payer: Medicare HMO

## 2021-01-05 ENCOUNTER — Encounter (HOSPITAL_BASED_OUTPATIENT_CLINIC_OR_DEPARTMENT_OTHER): Payer: Self-pay

## 2021-01-05 ENCOUNTER — Other Ambulatory Visit: Payer: Self-pay

## 2021-01-05 ENCOUNTER — Emergency Department (HOSPITAL_BASED_OUTPATIENT_CLINIC_OR_DEPARTMENT_OTHER)
Admission: EM | Admit: 2021-01-05 | Discharge: 2021-01-06 | Disposition: A | Discharge status: Home / Self Care | Payer: Medicare HMO | Attending: Emergency Medicine | Admitting: Emergency Medicine

## 2021-01-05 DIAGNOSIS — Z79899 Other long term (current) drug therapy: Secondary | ICD-10-CM | POA: Insufficient documentation

## 2021-01-05 DIAGNOSIS — I774 Celiac artery compression syndrome: Secondary | ICD-10-CM | POA: Diagnosis not present

## 2021-01-05 DIAGNOSIS — E039 Hypothyroidism, unspecified: Secondary | ICD-10-CM | POA: Insufficient documentation

## 2021-01-05 DIAGNOSIS — I2510 Atherosclerotic heart disease of native coronary artery without angina pectoris: Secondary | ICD-10-CM | POA: Insufficient documentation

## 2021-01-05 DIAGNOSIS — I129 Hypertensive chronic kidney disease with stage 1 through stage 4 chronic kidney disease, or unspecified chronic kidney disease: Secondary | ICD-10-CM | POA: Insufficient documentation

## 2021-01-05 DIAGNOSIS — Z7982 Long term (current) use of aspirin: Secondary | ICD-10-CM | POA: Insufficient documentation

## 2021-01-05 DIAGNOSIS — I708 Atherosclerosis of other arteries: Secondary | ICD-10-CM | POA: Diagnosis not present

## 2021-01-05 DIAGNOSIS — I10 Essential (primary) hypertension: Secondary | ICD-10-CM | POA: Diagnosis not present

## 2021-01-05 DIAGNOSIS — R1084 Generalized abdominal pain: Secondary | ICD-10-CM | POA: Diagnosis not present

## 2021-01-05 DIAGNOSIS — R1013 Epigastric pain: Secondary | ICD-10-CM | POA: Diagnosis not present

## 2021-01-05 DIAGNOSIS — Z8542 Personal history of malignant neoplasm of other parts of uterus: Secondary | ICD-10-CM | POA: Diagnosis not present

## 2021-01-05 DIAGNOSIS — N189 Chronic kidney disease, unspecified: Secondary | ICD-10-CM | POA: Insufficient documentation

## 2021-01-05 DIAGNOSIS — R0902 Hypoxemia: Secondary | ICD-10-CM | POA: Diagnosis not present

## 2021-01-05 DIAGNOSIS — R110 Nausea: Secondary | ICD-10-CM | POA: Diagnosis not present

## 2021-01-05 DIAGNOSIS — R52 Pain, unspecified: Secondary | ICD-10-CM | POA: Diagnosis not present

## 2021-01-05 DIAGNOSIS — Z20822 Contact with and (suspected) exposure to covid-19: Secondary | ICD-10-CM | POA: Diagnosis not present

## 2021-01-05 DIAGNOSIS — E1122 Type 2 diabetes mellitus with diabetic chronic kidney disease: Secondary | ICD-10-CM | POA: Insufficient documentation

## 2021-01-05 DIAGNOSIS — R079 Chest pain, unspecified: Secondary | ICD-10-CM | POA: Diagnosis not present

## 2021-01-05 DIAGNOSIS — E1165 Type 2 diabetes mellitus with hyperglycemia: Secondary | ICD-10-CM | POA: Diagnosis not present

## 2021-01-05 DIAGNOSIS — J45909 Unspecified asthma, uncomplicated: Secondary | ICD-10-CM | POA: Diagnosis not present

## 2021-01-05 DIAGNOSIS — R0789 Other chest pain: Secondary | ICD-10-CM | POA: Diagnosis not present

## 2021-01-05 DIAGNOSIS — I701 Atherosclerosis of renal artery: Secondary | ICD-10-CM | POA: Diagnosis not present

## 2021-01-05 DIAGNOSIS — I771 Stricture of artery: Secondary | ICD-10-CM | POA: Diagnosis not present

## 2021-01-05 DIAGNOSIS — R112 Nausea with vomiting, unspecified: Secondary | ICD-10-CM | POA: Diagnosis not present

## 2021-01-05 DIAGNOSIS — K219 Gastro-esophageal reflux disease without esophagitis: Secondary | ICD-10-CM | POA: Diagnosis not present

## 2021-01-05 LAB — CBC WITH DIFFERENTIAL/PLATELET
Abs Immature Granulocytes: 0.02 10*3/uL (ref 0.00–0.07)
Basophils Absolute: 0 10*3/uL (ref 0.0–0.1)
Basophils Relative: 0 %
Eosinophils Absolute: 0.1 10*3/uL (ref 0.0–0.5)
Eosinophils Relative: 1 %
HCT: 42.7 % (ref 36.0–46.0)
Hemoglobin: 14.4 g/dL (ref 12.0–15.0)
Immature Granulocytes: 0 %
Lymphocytes Relative: 10 %
Lymphs Abs: 0.7 10*3/uL (ref 0.7–4.0)
MCH: 30 pg (ref 26.0–34.0)
MCHC: 33.7 g/dL (ref 30.0–36.0)
MCV: 89 fL (ref 80.0–100.0)
Monocytes Absolute: 0.5 10*3/uL (ref 0.1–1.0)
Monocytes Relative: 7 %
Neutro Abs: 6.2 10*3/uL (ref 1.7–7.7)
Neutrophils Relative %: 82 %
Platelets: 226 10*3/uL (ref 150–400)
RBC: 4.8 MIL/uL (ref 3.87–5.11)
RDW: 13.6 % (ref 11.5–15.5)
WBC: 7.6 10*3/uL (ref 4.0–10.5)
nRBC: 0 % (ref 0.0–0.2)

## 2021-01-05 LAB — COMPREHENSIVE METABOLIC PANEL
ALT: 17 U/L (ref 0–44)
AST: 26 U/L (ref 15–41)
Albumin: 4.1 g/dL (ref 3.5–5.0)
Alkaline Phosphatase: 63 U/L (ref 38–126)
Anion gap: 13 (ref 5–15)
BUN: 27 mg/dL — ABNORMAL HIGH (ref 8–23)
CO2: 27 mmol/L (ref 22–32)
Calcium: 9.5 mg/dL (ref 8.9–10.3)
Chloride: 95 mmol/L — ABNORMAL LOW (ref 98–111)
Creatinine, Ser: 1.13 mg/dL — ABNORMAL HIGH (ref 0.44–1.00)
GFR, Estimated: 52 mL/min — ABNORMAL LOW (ref >60)
Glucose, Bld: 361 mg/dL — ABNORMAL HIGH (ref 70–99)
Potassium: 4 mmol/L (ref 3.5–5.1)
Sodium: 135 mmol/L (ref 135–145)
Total Bilirubin: 0.6 mg/dL (ref 0.3–1.2)
Total Protein: 7.5 g/dL (ref 6.5–8.1)

## 2021-01-05 LAB — TROPONIN I (HIGH SENSITIVITY): Troponin I (High Sensitivity): 5 ng/L (ref <18)

## 2021-01-05 LAB — LIPASE, BLOOD: Lipase: 34 U/L (ref 11–51)

## 2021-01-05 LAB — SARS CORONAVIRUS 2 BY RT PCR (HOSPITAL ORDER, PERFORMED IN ~~LOC~~ HOSPITAL LAB): SARS Coronavirus 2: NEGATIVE

## 2021-01-05 MED ORDER — IOHEXOL 350 MG/ML SOLN
100.0000 mL | Freq: Once | INTRAVENOUS | Status: AC | PRN
  Administered 2021-01-05: 100 mL via INTRAVENOUS

## 2021-01-05 MED ORDER — LIDOCAINE VISCOUS HCL 2 % MT SOLN
15.0000 mL | Freq: Once | OROMUCOSAL | Status: AC
  Administered 2021-01-05: 15 mL via ORAL
  Filled 2021-01-05: qty 15

## 2021-01-05 MED ORDER — ALUM & MAG HYDROXIDE-SIMETH 200-200-20 MG/5ML PO SUSP
30.0000 mL | Freq: Once | ORAL | Status: AC
  Administered 2021-01-05: 30 mL via ORAL
  Filled 2021-01-05: qty 30

## 2021-01-05 MED ORDER — FAMOTIDINE IN NACL 20-0.9 MG/50ML-% IV SOLN
20.0000 mg | Freq: Once | INTRAVENOUS | Status: AC
  Administered 2021-01-05: 20 mg via INTRAVENOUS
  Filled 2021-01-05: qty 50

## 2021-01-05 MED ORDER — SODIUM CHLORIDE 0.9 % IV BOLUS: 1000.0000 mL | Freq: Once | INTRAVENOUS | Status: DC

## 2021-01-05 MED ORDER — ONDANSETRON HCL 4 MG/2ML IJ SOLN
4.0000 mg | Freq: Once | INTRAMUSCULAR | Status: AC | PRN
  Administered 2021-01-05: 4 mg via INTRAVENOUS
  Filled 2021-01-05: qty 2

## 2021-01-05 NOTE — ED Provider Notes (Signed)
[facility] EMERGENCY DEPARTMENT Provider Note   CSN: [id number] Arrival date & time: 01/05/21  2108     History Chief Complaint  Patient presents with  . Abdominal Pain    [name] is a 70 y.o. female history of CKD, diabetes, hypertension, hyperlipidemia here presenting with abdominal pain. Patient has been having epigastric pain that is acute onset today.  Associated with some nausea and she took 3 Tums without relief.  She then started vomiting in triage and states that she now felt better.  She states that the pain is radiated up to her chest as well.  She was noted to be hypertensive blood pressure over 200 in triage.  Patient states that she is not on any blood pressure medicine currently.  The history is provided by the patient.       Past Medical History:  Diagnosis Date  . Allergy   . Anemia   . Arthritis   . Asthma    Dianosed years ago-no meds at this time  . Cancer [facility]) 2021   endometrial   . Cataract    Right eye removed-still has left cataract   . Chronic kidney disease 1997   Nepthrotic syndrom with minimal change-in remission per pt - last seen by kidney MD- 2 mos ago ? name of MD   . Diabetes mellitus    type 2   . Dyspnea    with exertion   . Dysrhythmia    "skips beats"  . Family history of adverse reaction to anesthesia    son stopped breathing during surgery , mother slow to wake up   . Fibromyalgia 1980's  . Generalized anxiety disorder   . GERD (gastroesophageal reflux disease)    occ- will use Tums and Prolosec  prn   . Heart murmur   . Hyperlipemia   . Hypertension    not on blood pressure meds due to allergies - last dose of meds- months ago   . Hypothyroidism   . Irritable bowel syndrome 1980's  . Major depressive disorder   . Migraine headaches 1980's   On meds-well controlled  . 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  . Morbid obesity ([place])   . Pneumonia 2009   Several times over the past several years   . PONV (postoperative nausea and vomiting)    was told by neurologist due to calcificaton in brain not to be put to sleep, N/V during Op and Recovery   . Recurrent upper respiratory infection (URI)   . Sleep apnea    no cpap  . Subarachnoid hemorrhage ([place]) 2010  . Vaginal polyp    benign per pt    Patient Active Problem List   Diagnosis Date Noted  . Anxiety and depression 10/30/2020  . Carotid artery stenosis 10/30/2020  . Facial droop 10/29/2020  . 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 HLD (hyperlipidemia) 10/29/2020  . Endometrial cancer ([place]) 10/18/2020  . CAD (coronary artery disease) 05/11/2020  . Acute encephalopathy 02/26/2020  . Hypothyroidism   . 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  . Essential tremor 06/24/2018  . Migraine without aura 06/24/2018  . Stress at home 08/28/2015  . Post-menopausal bleeding 06/16/2011  . Kidney disease 06/16/2011  . Colon polyp 06/16/2011  . Vaginal polyp 06/16/2011  . Chronic diarrhea 06/16/2011  . Diverticulitis 06/16/2011  . DUB (dysfunctional uterine bleeding) 06/16/2011  . Fatty liver 06/16/2011  . Diabetes mellitus type II, non insulin dependent ([place]) 06/16/2011    Past Surgical History:  Procedure  Laterality Date  . CHOLECYSTECTOMY  1983  . COLONOSCOPY  2008   DB  . [place] OF UTERUS  1972  . HYSTEROSCOPY WITH D & C  06/29/2011   Procedure: DILATATION AND CURETTAGE (D&C) /HYSTEROSCOPY;  Surgeon: Donnamae Jude, MD;  Location: [facility];  Service: Gynecology;  Laterality: N/A;  . ROBOTIC ASSISTED TOTAL HYSTERECTOMY WITH BILATERAL SALPINGO OOPHERECTOMY N/A 10/27/2020   Procedure: XI ROBOTIC ASSISTED TOTAL HYSTERECTOMY WITH BILATERAL SALPINGO OOPHORECTOMY;  Surgeon: Everitt Amber, MD;  Location: [facility];  Service: Gynecology;  Laterality: N/A;  . SENTINEL NODE BIOPSY N/A 10/27/2020   Procedure: SENTINEL NODE BIOPSY;  Surgeon: Everitt Amber, MD;  Location: [facility];  Service: Gynecology;  Laterality: N/A;     OB  History    Gravida  5   Para  3   Term  3   Preterm  0   AB  2   Living  3     SAB  2   IAB      Ectopic      Multiple      Live Births              Family History  Problem Relation Age of Onset  . Liver cancer Father        mets to liver   . Bladder Cancer Father   . Colon cancer Father        mets to colon   . Anesthesia problems Mother        hard to wake post op   . Cerebrovascular Disease Mother        Never had stroke, but had CEA.  . High blood pressure Other   . Diabetes Other   . Arthritis Other   . Anesthesia problems Son        stopped breathing post op   . Tremor Son   . Schizophrenia Daughter        Schizoaffective disorder  . Anesthesia problems Granddaughter        PONV  . Dementia Neg Hx   . Esophageal cancer Neg Hx   . Rectal cancer Neg Hx   . Stomach cancer Neg Hx   . Colon polyps Neg Hx     Social History   Tobacco Use  . Smoking status: Never Smoker  . Smokeless tobacco: Never Used  Vaping Use  . Vaping Use: Never used  Substance Use Topics  . Alcohol use: No    Comment: quit 1978  . Drug use: No    Home Medications Prior to Admission medications   Medication Sig Start Date End Date Taking? Authorizing Provider  buPROPion (WELLBUTRIN XL) 150 MG 24 hr tablet Take 1 tablet (150 mg total) by mouth daily. 10/31/20  Yes Dwyane Dee, MD  acetaminophen (TYLENOL) 500 MG tablet Take 500 mg by mouth every 6 (six) hours as needed for moderate pain.    [provider]  amitriptyline (ELAVIL) 10 MG tablet Take 10 mg by mouth at bedtime.  08/24/20   [provider]  aspirin EC 81 MG tablet Take 1 tablet (81 mg total) by mouth daily. Swallow whole. 06/10/20   Jettie Booze, MD  bumetanide (BUMEX) 1 MG tablet Take 1 mg by mouth every morning. 02/05/20   [provider]  clopidogrel (PLAVIX) 75 MG tablet Take 1 tablet (75 mg total) by mouth daily. 11/03/20 11/03/21  Dwyane Dee, MD  cyanocobalamin  (,VITAMIN B-12,) 1000 MCG/ML injection Inject 1,000 mcg  into the muscle every 30 (thirty) days.     [provider]  Evolocumab (REPATHA SURECLICK) 841 MG/ML SOAJ Inject 1 pen into the skin every 14 (fourteen) days. 05/11/20   Jettie Booze, MD  levothyroxine (SYNTHROID, LEVOTHROID) 50 MCG tablet Take 50 mcg by mouth daily.     [provider]  pantoprazole (PROTONIX) 40 MG tablet Take 1 tablet (40 mg total) by mouth daily. 06/10/20   Mauri Pole, MD  Potassium 99 MG TABS Take 198 mg by mouth daily.    [provider]    Allergies    Acarbose, Atenolol, Cinnamon, Glimepiride, Invokana [canagliflozin], Januvia [sitagliptin], Losartan potassium, Metformin and related, Other, Pioglitazone, Statins, Sulfamethoxazole, Tramadol hcl, and Sulfa antibiotics  Review of Systems   Review of Systems  Cardiovascular: Positive for chest pain.  Gastrointestinal: Positive for abdominal pain.  All other systems reviewed and are negative.   Physical Exam Updated Vital Signs BP (!) 154/77   Pulse 98   Temp 98.2 F (36.8 C) (Oral)   Resp (!) 22   Ht 5' 2.5" (1.588 m)   Wt 83.9 kg   SpO2 95%   BMI 33.30 kg/m   Physical Exam Vitals and nursing note reviewed.  Constitutional:      Comments: Uncomfortable, vomiting  HENT:     Head: Normocephalic.     Mouth/Throat:     Pharynx: Oropharynx is clear.  Eyes:     Extraocular Movements: Extraocular movements intact.  Cardiovascular:     Rate and Rhythm: Normal rate and regular rhythm.  Abdominal:     Comments: Epigastric and left upper quadrant tenderness.  No CVA tenderness.  Skin:    General: Skin is warm.     Capillary Refill: Capillary refill takes less than 2 seconds.  Neurological:     General: No focal deficit present.     Mental Status: She is oriented to person, place, and time.  Psychiatric:        Mood and Affect: Mood normal.     ED Results / Procedures / Treatments   Labs (all labs  ordered are listed, but only abnormal results are displayed) Labs Reviewed  COMPREHENSIVE METABOLIC PANEL - Abnormal; Notable for the following components:      Result Value   Chloride 95 (*)    Glucose, Bld 361 (*)    BUN 27 (*)    Creatinine, Ser 1.13 (*)    GFR, Estimated 52 (*)    All other components within normal limits  SARS CORONAVIRUS 2 BY RT PCR (HOSPITAL ORDER, [facility])  LIPASE, BLOOD  CBC WITH DIFFERENTIAL/PLATELET  URINALYSIS, ROUTINE W REFLEX MICROSCOPIC  TROPONIN I (HIGH SENSITIVITY)    EKG EKG Interpretation  Date/Time:  Thursday January 05 2021 21:17:50 EST Ventricular Rate:  103 PR Interval:    QRS Duration: 78 QT Interval:  366 QTC Calculation: 480 R Axis:   39 Text Interpretation: Sinus tachycardia Ventricular premature complex Aberrant conduction of SV complex(es) Low voltage, precordial leads No significant change since last tracing Confirmed by Wandra Arthurs [phone]) on 01/05/2021 9:28:22 PM   Radiology No results found.  Procedures Procedures (including critical care time)  Medications Ordered in ED Medications  ondansetron (ZOFRAN) injection 4 mg (4 mg Intravenous Given 01/05/21 2136)    ED Course  I have reviewed the triage vital signs and the nursing notes.  Pertinent labs & imaging results that were available during my care of the patient were  reviewed by me and considered in my medical decision making (see chart for details).    MDM Rules/Calculators/A&P                         Sanyiah Darlen Stavis is a 70 y.o. female here presenting with abdominal pain and chest pain.  Patient is hypertensive as well.  Concern for possible dissection.  Also consider recurrent endometrial cancer versus biliary colic.  Plan to get CT dissection study as well as CBC, CMP, lipase.    11:24 PM Dissection study is negative.  Patient has enteritis on CT. blood pressure came down to 150s from 200s with no intervention.  Plan to p.o.  trial and give Pepcid and GI cocktail. If patient tolerated p.o. anticipate discharge home.  Patient also pending a second troponin.  Signed out to Dr. Laverta Baltimore in the ED.   Final Clinical Impression(s) / ED Diagnoses Final diagnoses:  None    Rx / DC Orders ED Discharge Orders    None       Drenda Freeze, MD 01/05/21 2324

## 2021-01-05 NOTE — ED Triage Notes (Addendum)
Pt BIB EMS from home. Pt c/o LUQ abd pain and nausea that started today. Pt took 3 TUMS without relief. Pt actively vomiting during triage.

## 2021-01-05 NOTE — ED Provider Notes (Signed)
Blood pressure (!) 156/79, pulse (!) 102, temperature 98.2 F (36.8 C), temperature source Oral, resp. rate 20, height 5' 2.5" (1.588 m), weight 83.9 kg, SpO2 98 %.  Assuming care from Dr. Darl Householder.  In short, [name] is a 70 y.o. female with a chief complaint of Abdominal Pain .  Refer to the original H&P for additional details.  The current plan of care is to f/u on serial troponin and reassess. Labs at this time are reassuring. Patient is tolerating PO.    EKG Interpretation  Date/Time:  Thursday January 05 2021 21:17:50 EST Ventricular Rate:  103 PR Interval:    QRS Duration: 78 QT Interval:  366 QTC Calculation: 480 R Axis:   39 Text Interpretation: Sinus tachycardia Ventricular premature complex Aberrant conduction of SV complex(es) Low voltage, precordial leads No significant change since last tracing Confirmed by Wandra Arthurs [phone]) on 01/05/2021 9:28:22 PM      01:05 AM  Patient's lab work reviewed and reassuring.  Repeat troponin is negative.  Patient's symptoms have improved significantly since arrival in the ED.  Discussed that she will follow with her primary care doctor.  She has an appointment on February 9.  Discussed that if her symptoms become intermittent or more persistent she may need to reestablish with her GI doctor for upper endoscopy.  Doubt ACS clinically.  Have sent medicines over to the pharmacy for symptom management at home but discussed ED return precautions in detail.  Patient is comfortable with the plan at discharge.    Margette Fast, MD 01/06/21 [phone]

## 2021-01-06 DIAGNOSIS — R1013 Epigastric pain: Secondary | ICD-10-CM | POA: Diagnosis not present

## 2021-01-06 LAB — TROPONIN I (HIGH SENSITIVITY): Troponin I (High Sensitivity): 4 ng/L (ref <18)

## 2021-01-06 MED ORDER — DICYCLOMINE HCL 20 MG PO TABS: 20.0000 mg | ORAL_TABLET | Freq: Three times a day (TID) | ORAL | 0 refills | Status: DC | PRN

## 2021-01-06 MED ORDER — ONDANSETRON 4 MG PO TBDP: 4.0000 mg | ORAL_TABLET | Freq: Three times a day (TID) | ORAL | 0 refills | Status: DC | PRN

## 2021-01-06 MED ORDER — ALUM & MAG HYDROXIDE-SIMETH 400-400-40 MG/5ML PO SUSP: 10.0000 mL | Freq: Four times a day (QID) | ORAL | 0 refills | Status: DC | PRN

## 2021-01-06 MED ORDER — FAMOTIDINE 40 MG PO TABS: 40.0000 mg | ORAL_TABLET | Freq: Every day | ORAL | 0 refills | Status: DC

## 2021-01-06 NOTE — Discharge Instructions (Signed)

## 2021-01-16 DIAGNOSIS — E538 Deficiency of other specified B group vitamins: Secondary | ICD-10-CM | POA: Diagnosis not present

## 2021-01-18 ENCOUNTER — Ambulatory Visit (INDEPENDENT_AMBULATORY_CARE_PROVIDER_SITE_OTHER): Payer: Medicare HMO | Admitting: Gastroenterology

## 2021-01-18 ENCOUNTER — Encounter: Payer: Self-pay | Admitting: Gastroenterology

## 2021-01-18 ENCOUNTER — Other Ambulatory Visit: Payer: Self-pay

## 2021-01-18 VITALS — BP 150/78 | HR 92 | Ht 62.5 in | Wt 187.0 lb

## 2021-01-18 DIAGNOSIS — R109 Unspecified abdominal pain: Secondary | ICD-10-CM

## 2021-01-18 DIAGNOSIS — R1084 Generalized abdominal pain: Secondary | ICD-10-CM

## 2021-01-18 DIAGNOSIS — K582 Mixed irritable bowel syndrome: Secondary | ICD-10-CM

## 2021-01-18 MED ORDER — HYOSCYAMINE SULFATE SL 0.125 MG SL SUBL: 1.0000 | SUBLINGUAL_TABLET | Freq: Three times a day (TID) | SUBLINGUAL | 2 refills | Status: DC

## 2021-01-18 NOTE — Progress Notes (Signed)
[name]    [id number]    [name], [name]  Primary Care Physician:Little, Lennette Bihari, MD  Referring Physician: Hulan Fess, MD [place],  [place] [address]   Chief complaint: Generalized abdominal pain HPI:  70 year old very pleasant female here for follow-up visit for generalized abdominal pain and cramping.  She was recently in the ER on January 20 CT Angio chest, abd and pelvis  Mild prominent air and fluid filled loops of ileum, possible enteritis. Moderate amount of colonic stool see through out colon with diverticulosis  She has continued to have generalized abdominal cramping, alternating constipation and diarrhea. Denies any rectal bleeding.  No unintentional weight loss, does have some decreased appetite.  She has gained 15 pounds in the past 2 to 3 months  She is s/p hysterectomy, BSO and lymph node sampling on October 27, 2020 for stage I endometrial cancer  Her symptoms started 3 weeks..  She did well during immediate postop but in the last few weeks her symptoms have been progressively worse.  Colonoscopy June 2019: 2 small sessile polyps removed, 1 of which was tubular adenoma.  Colonic diverticulosis and internal hemorrhoids.  Outpatient Encounter Medications as of 01/18/2021  Medication Sig  . acetaminophen (TYLENOL) 500 MG tablet Take 500 mg by mouth every 6 (six) hours as needed for moderate pain.  [place] Kitchen alum & mag hydroxide-simeth (MAALOX MAX) 400-400-40 MG/5ML suspension Take 10 mLs by mouth every 6 (six) hours as needed for indigestion.  Mar[place] Kitchen amitriptyline (ELAVIL) 10 MG tablet Take 10 mg by mouth at bedtime.   [place] Kitchen aspirin EC 81 MG tablet Take 1 tablet (81 mg total) by mouth daily. Swallow whole. (Patient taking differently: Take 325 mg by mouth daily. Swallow whole.)  . bumetanide (BUMEX) 1 MG tablet Take 1 mg by mouth every morning.  Marland Kitchen buPROPion (WELLBUTRIN XL) 150 MG 24 hr tablet Take 1 tablet (150 mg total) by mouth daily.  .  cyanocobalamin (,VITAMIN B-12,) 1000 MCG/ML injection Inject 1,000 mcg into the muscle every 30 (thirty) days.   Mar[place] Kitchen dicyclomine (BENTYL) 20 MG tablet Take 1 tablet (20 mg total) by mouth 3 (three) times daily as needed for spasms.  . Evolocumab (REPATHA SURECLICK) 629 MG/ML SOAJ Inject 1 pen into the skin every 14 (fourteen) days.  . famotidine (PEPCID) 40 MG tablet Take 1 tablet (40 mg total) by mouth daily.  Marland Kitchen levothyroxine (SYNTHROID, LEVOTHROID) 50 MCG tablet Take 50 mcg by mouth daily.  . pantoprazole (PROTONIX) 40 MG tablet Take 1 tablet (40 mg total) by mouth daily.  . Potassium 99 MG TABS Take 198 mg by mouth daily.  . [DISCONTINUED] clopidogrel (PLAVIX) 75 MG tablet Take 1 tablet (75 mg total) by mouth daily.  . [DISCONTINUED] ondansetron (ZOFRAN ODT) 4 MG disintegrating tablet Take 1 tablet (4 mg total) by mouth every 8 (eight) hours as needed.   No facility-administered encounter medications on file as of 01/18/2021.    Allergies as of 01/18/2021 - Review Complete 01/18/2021  Allergen Reaction Noted  . Acarbose  05/20/2018  . Atenolol  09/29/2019  . Cinnamon  06/15/2015  . Glimepiride  05/20/2018  . Invokana [canagliflozin]  05/20/2018  . Januvia [sitagliptin]  05/20/2018  . Losartan potassium  05/20/2018  . Metformin and related Nausea And Vomiting and Other (See Comments) 06/26/2011  . Other Other (See Comments) 04/15/2020  . Pioglitazone Other (See Comments) 04/15/2020  . Statins  10/14/2020  . Sulfamethoxazole Other (See  Comments) 03/24/2007  . Tramadol hcl Itching 10/29/2020  . Sulfa antibiotics Rash and Other (See Comments) 06/16/2011    Past Medical History:  Diagnosis Date  . Allergy   . Anemia   . Arthritis   . Asthma    Dianosed years ago-no meds at this time  . Cancer [facility]) 2021   endometrial   . Cataract    Right eye removed-still has left cataract   . Chronic kidney disease 1997   Nepthrotic syndrom with minimal change-in remission per pt - last seen by  kidney MD- 2 mos ago ? name of MD   . Diabetes mellitus    type 2   . Dyspnea    with exertion   . Dysrhythmia    "skips beats"  . Family history of adverse reaction to anesthesia    son stopped breathing during surgery , mother slow to wake up   . Fibromyalgia 1980's  . Generalized anxiety disorder   . GERD (gastroesophageal reflux disease)    occ- will use Tums and Prolosec  prn   . Heart murmur   . Hyperlipemia   . Hypertension    not on blood pressure meds due to allergies - last dose of meds- months ago   . Hypothyroidism   . Irritable bowel syndrome 1980's  . Major depressive disorder   . Migraine headaches 1980's   On meds-well controlled  . 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  . Morbid obesity ([place])   . Pneumonia 2009   Several times over the past several years  . PONV (postoperative nausea and vomiting)    was told by neurologist due to calcificaton in brain not to be put to sleep, N/V during Op and Recovery   . Recurrent upper respiratory infection (URI)   . Sleep apnea    no cpap  . Subarachnoid hemorrhage ([place]) 2010  . Vaginal polyp    benign per pt    Past Surgical History:  Procedure Laterality Date  . CHOLECYSTECTOMY  1983  . COLONOSCOPY  2008   DB  . [place] OF UTERUS  1972  . HYSTEROSCOPY WITH D & C  06/29/2011   Procedure: DILATATION AND CURETTAGE (D&C) /HYSTEROSCOPY;  Surgeon: Donnamae Jude, MD;  Location: [facility];  Service: Gynecology;  Laterality: N/A;  . ROBOTIC ASSISTED TOTAL HYSTERECTOMY WITH BILATERAL SALPINGO OOPHERECTOMY N/A 10/27/2020   Procedure: XI ROBOTIC ASSISTED TOTAL HYSTERECTOMY WITH BILATERAL SALPINGO OOPHORECTOMY;  Surgeon: Everitt Amber, MD;  Location: [facility];  Service: Gynecology;  Laterality: N/A;  . SENTINEL NODE BIOPSY N/A 10/27/2020   Procedure: SENTINEL NODE BIOPSY;  Surgeon: Everitt Amber, MD;  Location: [facility];  Service: Gynecology;  Laterality: N/A;    Family History  Problem Relation Age of Onset  . Liver  cancer Father        mets to liver   . Bladder Cancer Father   . Colon cancer Father        mets to colon   . Anesthesia problems Mother        hard to wake post op   . Cerebrovascular Disease Mother        Never had stroke, but had CEA.  . High blood pressure Other   . Diabetes Other   . Arthritis Other   . Anesthesia problems Son        stopped breathing post op   . Tremor Son   . Schizophrenia Daughter        Schizoaffective disorder  .  Anesthesia problems Granddaughter        PONV  . Dementia Neg Hx   . Esophageal cancer Neg Hx   . Rectal cancer Neg Hx   . Stomach cancer Neg Hx   . Colon polyps Neg Hx     Social History   Socioeconomic History  . Marital status: Divorced    Spouse name: Not on file  . Number of children: 3  . Years of education: 50  . Highest education level: Not on file  Occupational History  . Occupation: Retired   Tobacco Use  . Smoking status: Never Smoker  . Smokeless tobacco: Never Used  Vaping Use  . Vaping Use: Never used  Substance and Sexual Activity  . Alcohol use: No    Comment: quit 1978  . Drug use: No  . Sexual activity: Not Currently  Other Topics Concern  . Not on file  Social History Narrative   Lives at home. Her granddaughter lives with her.    Right handed   Social Determinants of Health   Financial Resource Strain: Not on file  Food Insecurity: Not on file  Transportation Needs: Not on file  Physical Activity: Not on file  Stress: Not on file  Social Connections: Not on file  Intimate Partner Violence: Not on file      Review of systems: All other review of systems negative except as mentioned in the HPI.   Physical Exam: Vitals:   02/02/[name] 1049  BP: (!) 150/78  Pulse: 92   Body mass index is 33.66 kg/m. Gen:      No acute distress HEENT:  sclera anicteric Abd:      soft, non-tender; no palpable masses, no distension Ext:    No edema Neuro: alert and oriented x 3 Psych: normal mood and  affect  Data Reviewed:  Reviewed labs, radiology imaging, old records and pertinent past GI work up   Assessment and Plan/Recommendations:  70 year old very pleasant female with history of chronic irritable bowel syndrome, stage I endometrial cancer s/p TAH/BSO with generalized abdominal discomfort and cramping  CT angio chest, abdomen and pelvis, negative for any acute dissection.  Mild stenosis at celiac axis.  Aortic atherosclerosis.  Also showed increased colonic stool burden and nonspecific dilation of small bowel.  No evidence of metastatic disease.  Plan to do bowel purge Use Levsin sublingual 1 tablet every 8 hours as needed.  Advised patient to avoid taking dicyclomine when she is using Levsin.  She is up-to-date with colorectal cancer screening  Return in 3 to 4 weeks or sooner if needed  This visit required 40 minutes of patient care (this includes precharting, chart review, review of results, face-to-face time used for counseling as well as treatment plan and follow-up. The patient was provided an opportunity to ask questions and all were answered. The patient agreed with the plan and demonstrated an understanding of the instructions.  Damaris Hippo , MD    CC: Hulan Fess, MD

## 2021-01-18 NOTE — Patient Instructions (Addendum)
Dr Silverio Decamp recommends that you complete a bowel purge (to clean out your bowels). Please do the following: Follow Plenvu Kit instructions Make sure to drink plenty of water with your prep You should expect results within 1 to 6 hours after completing the bowel purge.  We will send Levsin to your pharmacy  HOLD Dicyclomine while taking Levsin   Due to recent changes in healthcare laws, you may see the results of your imaging and laboratory studies on MyChart before your provider has had a chance to review them.  We understand that in some cases there may be results that are confusing or concerning to you. Not all laboratory results come back in the same time frame and the provider may be waiting for multiple results in order to interpret others.  Please give [name] 48 hours in order for your provider to thoroughly review all the results before contacting the office for clarification of your results.   I appreciate the  opportunity to care for you  Thank You   Harl Bowie , MD

## 2021-01-19 ENCOUNTER — Telehealth: Payer: Self-pay | Admitting: *Deleted

## 2021-01-19 NOTE — Telephone Encounter (Signed)
Submitted through Cover My Meds today

## 2021-01-20 ENCOUNTER — Telehealth: Payer: Self-pay | Admitting: Gastroenterology

## 2021-01-20 NOTE — Telephone Encounter (Signed)
Returned patients call and she had questions about her bowel purge, I explained to her to use the Plenvu dose 1 packet first mixed with the water and drink plenty of water and other fluids with her dose 1 , If no results or feeling like she is not completley evacuated to mix and drink dose 2 packets A/B continue to drink lots of fluids.  She was worried that it still would not clean her out. I told her if she takes both doses and sees no redults to contact the office and let us know.   She was also informed that her Hyoscyamine was denied with her insurance  I will get with Dr Silverio Decamp and see what alternative she wants to send in for her     Dr Silverio Decamp patients insurance will not cover Hyoscyamine and she already takes Dicyclomine  What do you recommend

## 2021-01-20 NOTE — Telephone Encounter (Signed)
[name], which one would the doctor want her to follow?

## 2021-01-20 NOTE — Telephone Encounter (Signed)
Pt needs to know how to take Plenvu. She states that Kit instructions mentions two ways to take it, one is a split dose so pt is not sure of which one to do. Pls call her.

## 2021-01-24 ENCOUNTER — Telehealth: Payer: Self-pay

## 2021-01-24 ENCOUNTER — Telehealth: Payer: Self-pay | Admitting: Gastroenterology

## 2021-01-24 NOTE — Telephone Encounter (Signed)
Pt states that Plenvu did not clean her out completely. She would like to know if she can take more or something else.

## 2021-01-24 NOTE — Telephone Encounter (Signed)
Returned patient's call, got voice mail. Left message to please call back 

## 2021-01-25 ENCOUNTER — Telehealth: Payer: Self-pay | Admitting: Neurology

## 2021-01-25 NOTE — Telephone Encounter (Signed)
Pt called, I;m having some dizziness and would like to schedule an appt.  I Deneise Lever) ask Pt has this been discussed with the physician at [facility]. Pt stated, no I have not discuss with Dr. Jaynee Eagles. I informed the Pt we ask that you see your PCP first. Pt stated, I'm at my PCP's office and was informed they do not know will he will be back.  I Deneise Lever) ask the Pt can you talk to someone there to see what you need to do to get a referral sent to [facility]. Once referral is sent we would be glad to schedule you an appt with Dr. Jaynee Eagles.  Pt said, ok understood.

## 2021-01-26 NOTE — Telephone Encounter (Addendum)
Spoke with Dr. Jaynee Eagles.  She is aware of patient's call and we have also noted that she has referred patient previously over to [facility] for transfer of care.  I called patient and LVM (ok per DPR) to follow-up and advised that patient does need to see her primary care because dizziness could be from a number of things, possibly medication side effect or blood pressure changes when she stands up etc. This may not be neurologic at all.  I did advise the patient as well that if this is severe dizziness and she has any other concerning symptoms that she should call 911 and get to a hospital. Otherwise she needs to see primary care about the dizziness first. I also reminded the patient that we had transferred her over to [facility] neurology.  I left our office number in the message.

## 2021-01-26 NOTE — Telephone Encounter (Signed)
Called the patient and left a message to call us again.

## 2021-02-02 NOTE — Telephone Encounter (Signed)
Patient called back. Good results with the plenvu prep. Unfortunately she has now gone 5 days without a bowel movement again. She is starting to have the cramping. It is not as bad as it was yet. Apetite is good.  Please advise.

## 2021-02-02 NOTE — Telephone Encounter (Signed)
Continue Dicyclomine 20mg  Q8H prn given Hyoscyamine is not covered. Thank you

## 2021-02-02 NOTE — Telephone Encounter (Signed)
Discussed with the patient. She admits she has tried this before and did not feel it was helpful. She does agree to try again. She will increase the dosage based on her response.

## 2021-02-02 NOTE — Telephone Encounter (Signed)
Please have patient start taking Miralax 1 capful daily and adjust dose to have atleast 1 soft BM daily. Thanks

## 2021-02-03 DIAGNOSIS — E1169 Type 2 diabetes mellitus with other specified complication: Secondary | ICD-10-CM | POA: Diagnosis not present

## 2021-02-03 DIAGNOSIS — E1122 Type 2 diabetes mellitus with diabetic chronic kidney disease: Secondary | ICD-10-CM | POA: Diagnosis not present

## 2021-02-03 DIAGNOSIS — I10 Essential (primary) hypertension: Secondary | ICD-10-CM | POA: Diagnosis not present

## 2021-02-03 DIAGNOSIS — E785 Hyperlipidemia, unspecified: Secondary | ICD-10-CM | POA: Diagnosis not present

## 2021-02-03 DIAGNOSIS — E782 Mixed hyperlipidemia: Secondary | ICD-10-CM | POA: Diagnosis not present

## 2021-02-03 DIAGNOSIS — E039 Hypothyroidism, unspecified: Secondary | ICD-10-CM | POA: Diagnosis not present

## 2021-02-03 DIAGNOSIS — E1129 Type 2 diabetes mellitus with other diabetic kidney complication: Secondary | ICD-10-CM | POA: Diagnosis not present

## 2021-02-03 DIAGNOSIS — E1165 Type 2 diabetes mellitus with hyperglycemia: Secondary | ICD-10-CM | POA: Diagnosis not present

## 2021-02-03 DIAGNOSIS — N189 Chronic kidney disease, unspecified: Secondary | ICD-10-CM | POA: Diagnosis not present

## 2021-02-06 NOTE — Telephone Encounter (Signed)
Dr Silverio Decamp pts hyoscyamine was not coved b her insurance, She already takes dicyclomine do you want her to go ahead and continue the dicyclomine

## 2021-02-06 NOTE — Telephone Encounter (Signed)
Hyoscyamine was not approved by her insurance

## 2021-02-07 NOTE — Telephone Encounter (Signed)
Given Hyoscyamine is not covered, please advise her to continue Dicylomine 10mg  q12h prn and send refill. Thanks

## 2021-02-10 ENCOUNTER — Ambulatory Visit (INDEPENDENT_AMBULATORY_CARE_PROVIDER_SITE_OTHER): Payer: Medicare HMO | Admitting: Gastroenterology

## 2021-02-10 ENCOUNTER — Other Ambulatory Visit: Payer: Medicare HMO

## 2021-02-10 ENCOUNTER — Encounter: Payer: Self-pay | Admitting: Gastroenterology

## 2021-02-10 VITALS — BP 150/84 | HR 94 | Ht 62.5 in | Wt 190.0 lb

## 2021-02-10 DIAGNOSIS — R109 Unspecified abdominal pain: Secondary | ICD-10-CM

## 2021-02-10 DIAGNOSIS — K580 Irritable bowel syndrome with diarrhea: Secondary | ICD-10-CM

## 2021-02-10 MED ORDER — DICYCLOMINE HCL 10 MG PO CAPS: 10.0000 mg | ORAL_CAPSULE | Freq: Three times a day (TID) | ORAL | 11 refills | Status: DC | PRN

## 2021-02-10 NOTE — Telephone Encounter (Signed)
I have tried to contact pt to tell her just to continue dicyclomine cant get in touch with her

## 2021-02-10 NOTE — Progress Notes (Signed)
[name]    [id number]    [birth year]  Primary Care Physician:Little, Lennette Bihari, MD  Referring Physician: Hulan Fess, MD [place],  [place] [id number]   Chief complaint:  IBS constipation and diarrhea  HPI:  70 year old very pleasant female here for follow-up visit for IBS diarrhea and generalized abdominal pain with cramping.  She is having worsening diarrhea with 5-6 bowel movements a day.  She was taking MiraLAX but stopped it as it was making her diarrhea worse.  She is continuing to have increased stool frequency She was constipated previously after surgery. She did well during immediate postop but in the last few weeks her symptoms have been progressively worse  She is s/p hysterectomy, BSO and lymph node sampling on October 27, 2020 for stage I endometrial cancer  She was recently in the ER on January 20 CT Angio chest, abd and pelvis  Mild prominent air and fluid filled loops of ileum, possible enteritis. Moderate amount of colonic stool see through out colon with diverticulosis   Denies any rectal bleeding.  No unintentional weight loss, does have some decreased appetite.  She has gained 15 pounds in the past 2 to 3 months   Colonoscopy June 2019: 2 small sessile polyps removed, 1 of which was tubular adenoma.  Colonic diverticulosis and internal hemorrhoids.   Outpatient Encounter Medications as of 02/10/2021  Medication Sig  . acetaminophen (TYLENOL) 500 MG tablet Take 500 mg by mouth every 6 (six) hours as needed for moderate pain.  Marland Kitchen alum & mag hydroxide-simeth (MAALOX MAX) 400-400-40 MG/5ML suspension Take 10 mLs by mouth every 6 (six) hours as needed for indigestion.  Marland Kitchen amitriptyline (ELAVIL) 10 MG tablet Take 10 mg by mouth at bedtime.   Marland Kitchen aspirin EC 81 MG tablet Take 1 tablet (81 mg total) by mouth daily. Swallow whole. (Patient taking differently: Take 325 mg by mouth daily. Swallow whole.)  . bumetanide (BUMEX) 1 MG  tablet Take 1 mg by mouth every morning.  Marland Kitchen buPROPion (WELLBUTRIN XL) 150 MG 24 hr tablet Take 1 tablet (150 mg total) by mouth daily.  . cyanocobalamin (,VITAMIN B-12,) 1000 MCG/ML injection Inject 1,000 mcg into the muscle every 30 (thirty) days.   Marland Kitchen dicyclomine (BENTYL) 20 MG tablet Take 1 tablet (20 mg total) by mouth 3 (three) times daily as needed for spasms.  . Evolocumab (REPATHA SURECLICK) 884 MG/ML SOAJ Inject 1 pen into the skin every 14 (fourteen) days.  . famotidine (PEPCID) 40 MG tablet Take 1 tablet (40 mg total) by mouth daily.  Marland Kitchen Hyoscyamine Sulfate SL (LEVSIN/SL) 0.125 MG SUBL Place 1 tablet under the tongue in the morning, at noon, and at bedtime. As needed  . levothyroxine (SYNTHROID, LEVOTHROID) 50 MCG tablet Take 50 mcg by mouth daily.  . pantoprazole (PROTONIX) 40 MG tablet Take 1 tablet (40 mg total) by mouth daily.  . Potassium 99 MG TABS Take 198 mg by mouth daily.   No facility-administered encounter medications on file as of 02/10/2021.    Allergies as of 02/10/2021 - Review Complete 01/18/2021  Allergen Reaction Noted  . Acarbose  05/20/2018  . Atenolol  09/29/2019  . Cinnamon  06/15/2015  . Glimepiride  05/20/2018  . Invokana [canagliflozin]  05/20/2018  . Januvia [sitagliptin]  05/20/2018  . Losartan potassium  05/20/2018  . Metformin and related Nausea And Vomiting and Other (See Comments) 06/26/2011  . Other Other (See Comments) 04/15/2020  .  Pioglitazone Other (See Comments) 04/15/2020  . Statins  10/14/2020  . Sulfamethoxazole Other (See Comments) 03/24/2007  . Tramadol hcl Itching 10/29/2020  . Sulfa antibiotics Rash and Other (See Comments) 06/16/2011    Past Medical History:  Diagnosis Date  . Allergy   . Anemia   . Arthritis   . Asthma    Dianosed years ago-no meds at this time  . Cancer [facility]) 2021   endometrial   . Cataract    Right eye removed-still has left cataract   . Chronic kidney disease 1997   Nepthrotic syndrom with minimal  change-in remission per pt - last seen by kidney MD- 2 mos ago ? name of MD   . Diabetes mellitus    type 2   . Dyspnea    with exertion   . Dysrhythmia    "skips beats"  . Family history of adverse reaction to anesthesia    son stopped breathing during surgery , mother slow to wake up   . Fibromyalgia 1980's  . Generalized anxiety disorder   . GERD (gastroesophageal reflux disease)    occ- will use Tums and Prolosec  prn   . Heart murmur   . Hyperlipemia   . Hypertension    not on blood pressure meds due to allergies - last dose of meds- months ago   . Hypothyroidism   . Irritable bowel syndrome 1980's  . Major depressive disorder   . Migraine headaches 1980's   On meds-well controlled  . 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  . Morbid obesity ([place])   . Pneumonia 2009   Several times over the past several years  . PONV (postoperative nausea and vomiting)    was told by neurologist due to calcificaton in brain not to be put to sleep, N/V during Op and Recovery   . Recurrent upper respiratory infection (URI)   . Sleep apnea    no cpap  . Subarachnoid hemorrhage ([place]) 2010  . Vaginal polyp    benign per pt    Past Surgical History:  Procedure Laterality Date  . CHOLECYSTECTOMY  1983  . COLONOSCOPY  2008   DB  . Falls OF UTERUS  1972  . HYSTEROSCOPY WITH D & C  06/29/2011   Procedure: DILATATION AND CURETTAGE (D&C) /HYSTEROSCOPY;  Surgeon: Donnamae Jude, MD;  Location: [facility];  Service: Gynecology;  Laterality: N/A;  . ROBOTIC ASSISTED TOTAL HYSTERECTOMY WITH BILATERAL SALPINGO OOPHERECTOMY N/A 10/27/2020   Procedure: XI ROBOTIC ASSISTED TOTAL HYSTERECTOMY WITH BILATERAL SALPINGO OOPHORECTOMY;  Surgeon: Everitt Amber, MD;  Location: [facility];  Service: Gynecology;  Laterality: N/A;  . SENTINEL NODE BIOPSY N/A 10/27/2020   Procedure: SENTINEL NODE BIOPSY;  Surgeon: Everitt Amber, MD;  Location: [facility];  Service: Gynecology;  Laterality: N/A;    Family History   Problem Relation Age of Onset  . Liver cancer Father        mets to liver   . Bladder Cancer Father   . Colon cancer Father        mets to colon   . Anesthesia problems Mother        hard to wake post op   . Cerebrovascular Disease Mother        Never had stroke, but had CEA.  . High blood pressure Other   . Diabetes Other   . Arthritis Other   . Anesthesia problems Son        stopped breathing post op   . Tremor  Son   . Schizophrenia Daughter        Schizoaffective disorder  . Anesthesia problems Granddaughter        PONV  . Dementia Neg Hx   . Esophageal cancer Neg Hx   . Rectal cancer Neg Hx   . Stomach cancer Neg Hx   . Colon polyps Neg Hx     Social History   Socioeconomic History  . Marital status: Divorced    Spouse name: Not on file  . Number of children: 3  . Years of education: 71  . Highest education level: Not on file  Occupational History  . Occupation: Retired   Tobacco Use  . Smoking status: Never Smoker  . Smokeless tobacco: Never Used  Vaping Use  . Vaping Use: Never used  Substance and Sexual Activity  . Alcohol use: No    Comment: quit 1978  . Drug use: No  . Sexual activity: Not Currently  Other Topics Concern  . Not on file  Social History Narrative   Lives at home. Her granddaughter lives with her.    Right handed   Social Determinants of Health   Financial Resource Strain: Not on file  Food Insecurity: Not on file  Transportation Needs: Not on file  Physical Activity: Not on file  Stress: Not on file  Social Connections: Not on file  Intimate Partner Violence: Not on file      Review of systems: All other review of systems negative except as mentioned in the HPI.   Physical Exam: Vitals:   02/10/21 1125  BP: (!) 150/84  Pulse: 94   Body mass index is 34.2 kg/m. Gen:      No acute distress HEENT:  sclera anicteric Abd:      soft, non-tender; no palpable masses, no distension Ext:    No edema Neuro: alert and  oriented x 3 Psych: normal mood and affect  Data Reviewed:  Reviewed labs, radiology imaging, old records and pertinent past GI work up   Assessment and Plan/Recommendations:  70 year old very pleasant female with history of stage I endometrial cancer s/p TAH/BSO with complaints of generalized abdominal discomfort with cramping and irritable bowel syndrome with worsening diarrhea  Constipation improved after bowel purge but she is having multiple bowel movements with semiformed to liquid stool Check GI pathogen panel, O&P to exclude infectious etiology Check fecal elastase and fecal fat to evaluate for possible pancreatic insufficiency, will do a trial of Creon If continues to have persistent diarrhea, will consider colonoscopy with biopsies to exclude microscopic colitis  Advised patient to avoid taking any laxatives including MiraLAX Small frequent meals  Use dicyclomine as needed for abdominal cramping up to 3 times daily  Continue antireflux measures and Protonix daily for GERD  This visit required 40 minutes of patient care (this includes precharting, chart review, review of results, face-to-face time used for counseling as well as treatment plan and follow-up. The patient was provided an opportunity to ask questions and all were answered. The patient agreed with the plan and demonstrated an understanding of the instructions.  Damaris Hippo , MD    CC: Hulan Fess, MD

## 2021-02-10 NOTE — Patient Instructions (Signed)
Your provider has requested that you go to the basement level for lab work before leaving today. Press "B" on the elevator. The lab is located at the first door on the left as you exit the elevator.  Stop taking Miralax.  Eat small frequent meals.   We have sent the following medications to your pharmacy for you to pick up at your convenience: dicyclomine.   Continue protonix daily.   Due to recent changes in healthcare laws, you may see the results of your imaging and laboratory studies on MyChart before your provider has had a chance to review them.  We understand that in some cases there may be results that are confusing or concerning to you. Not all laboratory results come back in the same time frame and the provider may be waiting for multiple results in order to interpret others.  Please give [name] 48 hours in order for your provider to thoroughly review all the results before contacting the office for clarification of your results.

## 2021-02-13 ENCOUNTER — Other Ambulatory Visit: Payer: Medicare HMO

## 2021-02-13 DIAGNOSIS — Z8719 Personal history of other diseases of the digestive system: Secondary | ICD-10-CM | POA: Diagnosis not present

## 2021-02-13 DIAGNOSIS — R109 Unspecified abdominal pain: Secondary | ICD-10-CM | POA: Diagnosis not present

## 2021-02-13 DIAGNOSIS — K580 Irritable bowel syndrome with diarrhea: Secondary | ICD-10-CM

## 2021-02-13 DIAGNOSIS — A09 Infectious gastroenteritis and colitis, unspecified: Secondary | ICD-10-CM | POA: Diagnosis not present

## 2021-02-16 LAB — GI PROFILE, STOOL, PCR

## 2021-02-20 ENCOUNTER — Telehealth: Payer: Self-pay | Admitting: Gastroenterology

## 2021-02-20 LAB — TIQ-AOE

## 2021-02-20 NOTE — Telephone Encounter (Signed)
Patient is requesting lab results does to know how to work with [employer]

## 2021-02-21 ENCOUNTER — Telehealth: Payer: Self-pay

## 2021-02-21 DIAGNOSIS — E538 Deficiency of other specified B group vitamins: Secondary | ICD-10-CM | POA: Diagnosis not present

## 2021-02-21 NOTE — Telephone Encounter (Signed)
Called patient back and got her voice mail. Left message that only part of her labs resulted late yesterday, but some results are not back yet. That as soon as they are and [name] has reviewed them, we will call her.

## 2021-02-21 NOTE — Telephone Encounter (Signed)
Faxed authorizing signature (Dr. Woodward Ku ) to [facility] for TIQ-AOE lab to be processed. Fax #: [phone]

## 2021-02-24 LAB — OVA AND PARASITE EXAMINATION
CONCENTRATE RESULT:: NONE SEEN
MICRO NUMBER:: 11586540
SPECIMEN QUALITY:: ADEQUATE
TRICHROME RESULT:: NONE SEEN

## 2021-02-24 LAB — FECAL FAT, QUANTITATIVE: Specimen Total Weight: 3 g

## 2021-02-24 LAB — PANCREATIC ELASTASE, FECAL: Pancreatic Elastase-1, Stool: 475 mcg/g

## 2021-03-06 ENCOUNTER — Encounter: Payer: Self-pay | Admitting: Gastroenterology

## 2021-03-06 DIAGNOSIS — E1129 Type 2 diabetes mellitus with other diabetic kidney complication: Secondary | ICD-10-CM | POA: Diagnosis not present

## 2021-03-06 DIAGNOSIS — E1122 Type 2 diabetes mellitus with diabetic chronic kidney disease: Secondary | ICD-10-CM | POA: Diagnosis not present

## 2021-03-06 DIAGNOSIS — E1169 Type 2 diabetes mellitus with other specified complication: Secondary | ICD-10-CM | POA: Diagnosis not present

## 2021-03-06 DIAGNOSIS — N189 Chronic kidney disease, unspecified: Secondary | ICD-10-CM | POA: Diagnosis not present

## 2021-03-06 DIAGNOSIS — I10 Essential (primary) hypertension: Secondary | ICD-10-CM | POA: Diagnosis not present

## 2021-03-06 DIAGNOSIS — E1165 Type 2 diabetes mellitus with hyperglycemia: Secondary | ICD-10-CM | POA: Diagnosis not present

## 2021-03-06 DIAGNOSIS — E782 Mixed hyperlipidemia: Secondary | ICD-10-CM | POA: Diagnosis not present

## 2021-03-06 DIAGNOSIS — E039 Hypothyroidism, unspecified: Secondary | ICD-10-CM | POA: Diagnosis not present

## 2021-03-06 DIAGNOSIS — E785 Hyperlipidemia, unspecified: Secondary | ICD-10-CM | POA: Diagnosis not present

## 2021-03-06 NOTE — Telephone Encounter (Signed)
Patient calling for lab results on stool studies.  [name], please advise TIQ-AOE

## 2021-03-06 NOTE — Telephone Encounter (Signed)
Inbound call from patient requesitng a call back please to see if lab results are in.

## 2021-03-06 NOTE — Telephone Encounter (Signed)
Called patient, explained results.  She continues to have severe intermittent diarrhea. On some days she can have upto 6 BM/day. Last week was better but today is worse Her daughter is currently hospitalized for treatment of schizoaffective disorder. She is under chronic stress  Robin, please send Rx for Lomotil 1 tablet twice daily as needed X 60 tabs with no refills  Advised patient to call back in 2 weeks, if no improvement will consider colonoscopy to exclude microscopic colitis.

## 2021-03-07 MED ORDER — DIPHENOXYLATE-ATROPINE 2.5-0.025 MG PO TABS: 1.0000 | ORAL_TABLET | Freq: Two times a day (BID) | ORAL | 1 refills | Status: DC | PRN

## 2021-03-07 NOTE — Telephone Encounter (Signed)
Sent prescription of Lomotil as requested by  Dr Silverio Decamp

## 2021-03-07 NOTE — Addendum Note (Signed)
Addended by: Oda Kilts on: 03/07/2021 01:36 PM   Modules accepted: Orders

## 2021-03-23 DIAGNOSIS — E538 Deficiency of other specified B group vitamins: Secondary | ICD-10-CM | POA: Diagnosis not present

## 2021-03-28 DIAGNOSIS — E1169 Type 2 diabetes mellitus with other specified complication: Secondary | ICD-10-CM | POA: Diagnosis not present

## 2021-03-28 DIAGNOSIS — E1122 Type 2 diabetes mellitus with diabetic chronic kidney disease: Secondary | ICD-10-CM | POA: Diagnosis not present

## 2021-03-28 DIAGNOSIS — D510 Vitamin B12 deficiency anemia due to intrinsic factor deficiency: Secondary | ICD-10-CM | POA: Diagnosis not present

## 2021-03-28 DIAGNOSIS — E1165 Type 2 diabetes mellitus with hyperglycemia: Secondary | ICD-10-CM | POA: Diagnosis not present

## 2021-03-28 DIAGNOSIS — I10 Essential (primary) hypertension: Secondary | ICD-10-CM | POA: Diagnosis not present

## 2021-03-28 DIAGNOSIS — E039 Hypothyroidism, unspecified: Secondary | ICD-10-CM | POA: Diagnosis not present

## 2021-03-28 DIAGNOSIS — E782 Mixed hyperlipidemia: Secondary | ICD-10-CM | POA: Diagnosis not present

## 2021-03-28 DIAGNOSIS — E1129 Type 2 diabetes mellitus with other diabetic kidney complication: Secondary | ICD-10-CM | POA: Diagnosis not present

## 2021-03-28 DIAGNOSIS — I2510 Atherosclerotic heart disease of native coronary artery without angina pectoris: Secondary | ICD-10-CM | POA: Diagnosis not present

## 2021-04-13 ENCOUNTER — Encounter: Payer: Self-pay | Admitting: Gynecologic Oncology

## 2021-04-17 ENCOUNTER — Inpatient Hospital Stay: Payer: Medicare HMO | Attending: Gynecologic Oncology | Admitting: Gynecologic Oncology

## 2021-04-17 ENCOUNTER — Encounter: Payer: Self-pay | Admitting: Gynecologic Oncology

## 2021-04-17 ENCOUNTER — Other Ambulatory Visit: Payer: Self-pay

## 2021-04-17 VITALS — BP 171/80 | HR 93 | Temp 97.5°F | Resp 16 | Ht 62.5 in | Wt 191.0 lb

## 2021-04-17 DIAGNOSIS — E1122 Type 2 diabetes mellitus with diabetic chronic kidney disease: Secondary | ICD-10-CM | POA: Diagnosis not present

## 2021-04-17 DIAGNOSIS — I2510 Atherosclerotic heart disease of native coronary artery without angina pectoris: Secondary | ICD-10-CM | POA: Diagnosis not present

## 2021-04-17 DIAGNOSIS — Z79899 Other long term (current) drug therapy: Secondary | ICD-10-CM | POA: Diagnosis not present

## 2021-04-17 DIAGNOSIS — C541 Malignant neoplasm of endometrium: Secondary | ICD-10-CM

## 2021-04-17 DIAGNOSIS — I129 Hypertensive chronic kidney disease with stage 1 through stage 4 chronic kidney disease, or unspecified chronic kidney disease: Secondary | ICD-10-CM | POA: Insufficient documentation

## 2021-04-17 DIAGNOSIS — E039 Hypothyroidism, unspecified: Secondary | ICD-10-CM | POA: Insufficient documentation

## 2021-04-17 DIAGNOSIS — N189 Chronic kidney disease, unspecified: Secondary | ICD-10-CM | POA: Insufficient documentation

## 2021-04-17 DIAGNOSIS — E785 Hyperlipidemia, unspecified: Secondary | ICD-10-CM | POA: Insufficient documentation

## 2021-04-17 DIAGNOSIS — K219 Gastro-esophageal reflux disease without esophagitis: Secondary | ICD-10-CM | POA: Insufficient documentation

## 2021-04-17 NOTE — Progress Notes (Signed)
Gynecologic Oncology Follow-up  Chief Complaint  Patient presents with  . Endometrial cancer [facility])    Assessment/Plan:  [name]  is a 70 y.o.  year old P3 with stage IA grade 1 endometrial cancer, MMR deficient, (hypermethylation present), s/p surgical staging on 10/27/20 . Pathology revealed low risk factors for recurrence, therefore no adjuvant therapy is recommended according to NCCN guidelines.  I discussed risk for recurrence and typical symptoms encouraged her to notify [name] of these should they develop between visits.  I recommend she continue to have follow-up every 6 months for 5 years in accordance with NCCN guidelines. Those visits should include symptom assessment, physical exam and pelvic examination. Pap smears are not indicated or recommended in the routine surveillance of endometrial cancer.   HPI: [name] is a 70 year old P 3 who was seen in consultation at the request of [name] for evaluation and treatment of grade 1 endometrial cancer (MMR abnormal).   Her symptoms began with postmenopausal bleeding in late September 2021.  She saw [name] for a new patient consultation on 08/26/20.  Work-up of symptoms included transvaginal [name], and biopsy. Transvaginal [name] on 09/01/20 showed a uterus measuring 9.4 x 3.6 x 5.4 cm with an endometrial thickness of 6 mm.  The ovaries were grossly normal bilaterally. Endometrial sampling with endometrial pipelle biopsy was performed on 09/19/20 and showed 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  She has been having issues in the recent year with word finding difficulty and weakness for which she has had extensive neurologic work-up.  This included head imaging and  it was felt that she had progressive calcifications in her brain.  She had been told in the past that she could not have general anesthesia due to her prior brain injury.  Additionally she is known to have coronary artery calcifications consistent with coronary artery disease in addition to history of a dysrhythmia for which she sees Isabella. Irish [name].   She has a history of nephrotic syndrome and chronic kidney disease.  She has a history of diabetes mellitus, type II, for which she was prescribed multiple oral antiglycemic's in the past none of which she tolerated.  She declined taking insulin.  Her last hemoglobin A1c was 6.9% in April 2021.  In addition to the above-stated issues she has hypothyroidism hypertension hyperlipidemia gastroesophageal reflux and asthma.    She lives with her granddaughter who is 33-1/2 years old.  MRI was performed of the pelvis on 10/10/2020 and showed a deeply invasive myometrial extending tumor from the endometrium.  There was no apparent lymphadenopathy or extrauterine disease present, however this appeared to be at least clinical stage Ib.  She had seen her cardiologist for optimization of her stratification and was felt to be a good candidate for surgery by him.  Her neurologist was contacted regarding candidacy for surgery and Trendelenburg positioning with her prior intracranial calcifications.  Her neurologist declined to comment on risk for this.  She decided to proceed with definitive surgical staging.  On 10/27/20 she underwent robotic assisted total hysterectomy, BSO, SLN biopsy. Intraoperative findings were significant for a 10cm fibroid uterus with anterior prolapse, suspicious left external iliac SLN, normal peritoneum. Surgery was uncomplicated.  Final pathology revealed a  FIGO grade 1 endometrioid adenocarcinoma of the endometrium, there was 2 of 36m myometrial invasion, no LVSI and negative cervix and adnexa and lymph nodes. MMR testing showed loss of  expression of MLH1 and PMS2. 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   Following surgery she developed symptoms of a [facility]  Comprehensive neurologic testing failed to demonstrate evidence of a CVA. She was empirically prescribed plavix, however counseled to avoid this in at least the first week after surgery.   Interval Hx: She reported to me that she is continuing to have loose stools. She is being seeing and managed for this by her gastroenterologist.    Current Meds:  Outpatient Encounter Medications as of 04/17/2021  Medication Sig  . acetaminophen (TYLENOL) 500 MG tablet Take 500 mg by mouth every 6 (six) hours as needed for moderate pain.  .Marland Kitchenamitriptyline (ELAVIL) 10 MG tablet Take 10 mg by mouth at bedtime.   .Marland Kitchenaspirin 325 MG tablet Take 325 mg by mouth daily.  . bumetanide (BUMEX) 1 MG tablet Take 1 mg by mouth every morning.  . cyanocobalamin (,VITAMIN B-12,) 1000 MCG/ML injection Inject 1,000 mcg into the muscle every 30 (thirty) days.   .Marland Kitchendicyclomine (BENTYL) 10 MG capsule Take 1 capsule (10 mg total) by mouth every 8 (eight) hours as needed for spasms.  . diphenoxylate-atropine (LOMOTIL) 2.5-0.025 MG tablet Take 1 tablet by mouth 2 (two) times daily as needed for diarrhea or loose stools.  . Evolocumab (REPATHA SURECLICK) 1417MG/ML SOAJ Inject 1 pen into the skin every 14 (fourteen) days.  .Marland Kitchenlevothyroxine (SYNTHROID, LEVOTHROID) 50 MCG tablet Take 50 mcg by mouth daily.  .Marland Kitchenalum & mag hydroxide-simeth (MAALOX MAX) 400-400-40 MG/5ML suspension Take 10 mLs by mouth every 6 (six) hours as needed for indigestion. (Patient not taking: Reported on 04/13/2021)  . pantoprazole (PROTONIX) 40 MG tablet Take 1 tablet (40 mg total) by mouth daily. (Patient not taking: Reported on 04/13/2021)  . Potassium 99 MG TABS Take 198 mg by mouth daily.  . [DISCONTINUED] famotidine (PEPCID) 40 MG tablet Take 1 tablet (40 mg  total) by mouth daily.   No facility-administered encounter medications on file as of 04/17/2021.    Allergy:  Allergies  Allergen Reactions  . Acarbose   . Atenolol     Stomach problems, chills  . Cinnamon     Stomach pain  . Glimepiride     ALL DIABETIC MEDS PER PT   . Invokana [Canagliflozin]     ALL DIABETIC MEDS PER PT   . Januvia [Sitagliptin]     ALL DIABETIC MEDS PER PT   . Losartan Potassium   . Metformin And Related Nausea And Vomiting and Other (See Comments)    Patient states that she has severe chills, headache and cramping additionally. Says blood sugar is uncontrolled because of this  . Other Other (See Comments)    ALL DIABETIC MEDS PER PT   . Pioglitazone Other (See Comments)    ALL DIABETIC MEDS PER PT   . Statins     Myalgia  . Sulfamethoxazole Other (See Comments)    ALL DIABETIC MEDS PER PT   . Tramadol Hcl Itching  . Sulfa Antibiotics Rash and Other (See Comments)    Mother has told patient in the past not to take, she cannot recall there reaction    Social Hx:   Social History   Socioeconomic History  . Marital  status: Divorced    Spouse name: Not on file  . Number of children: 3  . Years of education: 47  . Highest education level: Not on file  Occupational History  . Occupation: Retired   Tobacco Use  . Smoking status: Never Smoker  . Smokeless tobacco: Never Used  Vaping Use  . Vaping Use: Never used  Substance and Sexual Activity  . Alcohol use: No    Comment: quit 1978  . Drug use: No  . Sexual activity: Not Currently  Other Topics Concern  . Not on file  Social History Narrative   Lives at home. Her granddaughter lives with her.    Right handed   Social Determinants of Health   Financial Resource Strain: Not on file  Food Insecurity: Not on file  Transportation Needs: Not on file  Physical Activity: Not on file  Stress: Not on file  Social Connections: Not on file  Intimate Partner Violence: Not on file    Past  Surgical Hx:  Past Surgical History:  Procedure Laterality Date  . CHOLECYSTECTOMY  1983  . COLONOSCOPY  2008   DB  . [place] OF UTERUS  1972  . HYSTEROSCOPY WITH D & C  06/29/2011   Procedure: DILATATION AND CURETTAGE (D&C) /HYSTEROSCOPY;  Surgeon: Donnamae Jude, MD;  Location: [facility];  Service: Gynecology;  Laterality: N/A;  . ROBOTIC ASSISTED TOTAL HYSTERECTOMY WITH BILATERAL SALPINGO OOPHERECTOMY N/A 10/27/2020   Procedure: XI ROBOTIC ASSISTED TOTAL HYSTERECTOMY WITH BILATERAL SALPINGO OOPHORECTOMY;  Surgeon: Everitt Amber, MD;  Location: [facility];  Service: Gynecology;  Laterality: N/A;  . SENTINEL NODE BIOPSY N/A 10/27/2020   Procedure: SENTINEL NODE BIOPSY;  Surgeon: Everitt Amber, MD;  Location: [facility];  Service: Gynecology;  Laterality: N/A;    Past Medical Hx:  Past Medical History:  Diagnosis Date  . Allergy   . Anemia   . Arthritis   . Asthma    Dianosed years ago-no meds at this time  . Cancer [facility]) 2021   endometrial   . Cataract    Right eye removed-still has left cataract   . Chronic kidney disease 1997   Nepthrotic syndrom with minimal change-in remission per pt - last seen by kidney MD- 2 mos ago ? name of MD   . Diabetes mellitus    type 2   . Dyspnea    with exertion   . Dysrhythmia    "skips beats"  . Family history of adverse reaction to anesthesia    son stopped breathing during surgery , mother slow to wake up   . Fibromyalgia 1980's  . Generalized anxiety disorder   . GERD (gastroesophageal reflux disease)    occ- will use Tums and Prolosec  prn   . Heart murmur   . Hyperlipemia   . Hypertension    not on blood pressure meds due to allergies - last dose of meds- months ago   . Hypothyroidism   . Irritable bowel syndrome 1980's  . Major depressive disorder   . Migraine headaches 1980's   On meds-well controlled  . 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  . Morbid obesity ([place])   . Pneumonia 2009   Several times over the past several  years  . PONV (postoperative nausea and vomiting)    was told by neurologist due to calcificaton in brain not to be put to sleep, N/V during Op and Recovery   . Recurrent upper respiratory infection (URI)   . Sleep apnea  no cpap  . Subarachnoid hemorrhage ([place]) 2010  . Vaginal polyp    benign per pt    Past Gynecological History:  P3 No LMP recorded. Patient is postmenopausal.  Family Hx:  Family History  Problem Relation Age of Onset  . Liver cancer Father        mets to liver   . Bladder Cancer Father   . Colon cancer Father        mets to colon   . Anesthesia problems Mother        hard to wake post op   . Cerebrovascular Disease Mother        Never had stroke, but had CEA.  . High blood pressure Other   . Diabetes Other   . Arthritis Other   . Anesthesia problems Son        stopped breathing post op   . Tremor Son   . Schizophrenia Daughter        Schizoaffective disorder  . Anesthesia problems Granddaughter        PONV  . Dementia Neg Hx   . Esophageal cancer Neg Hx   . Rectal cancer Neg Hx   . Stomach cancer Neg Hx   . Colon polyps Neg Hx     Review of Systems:  Constitutional  Feels weak  ENT Normal appearing ears and nares bilaterally Skin/Breast  No rash, sores, jaundice, itching, dryness Cardiovascular  + arrhythmia Pulmonary  No cough or wheeze.  Gastro Intestinal  No nausea, vomitting, or diarrhoea. No bright red blood per rectum, no abdominal pain, change in bowel movement, or constipation.  Genito Urinary  No frequency, urgency, dysuria, no bleeding Musculo Skeletal  + arthritis Neurologic  + tremor and word finding difficulty,  Psychology  + anxiety  Vitals:  Blood pressure (!) 171/80, pulse 93, temperature (!) 97.5 F (36.4 C), temperature source Tympanic, resp. rate 16, height 5' 2.5" (1.588 m), weight 191 lb (86.6 kg), SpO2 100 %.  Physical Exam: WD in NAD Neck  Supple NROM, without any enlargements.  Lymph Node Survey No  cervical supraclavicular or inguinal adenopathy Cardiovascular  Pulse normal rate, regularity and rhythm. S1 and S2 normal.  Lungs  Clear to auscultation bilateraly, without wheezes/crackles/rhonchi. Good air movement.  Skin  No rash/lesions/breakdown  Psychiatry  Alert and oriented to person, place, and time  Abdomen  Normoactive bowel sounds, abdomen soft, non-tender and obese without evidence of hernia. Soft incisions.  Back No CVA tenderness Genito Urinary  Vaginal cuff smooth with no palpable masses or visible lesions.  Extremities  No bilateral cyanosis, clubbing or edema.  Thereasa Solo, MD  04/17/2021, 1:11 PM

## 2021-04-17 NOTE — Patient Instructions (Signed)
Please notify Dr Denman George at phone number [phone] if you notice vaginal bleeding, new pelvic or abdominal pains, bloating, feeling full easy, or a change in bladder or bowel function.   Please contact Dr Serita Grit office (at [phone]) in or after July to request an appointment with her for November, 2022.  Try metamucil daily for your loose stools.

## 2021-05-01 ENCOUNTER — Other Ambulatory Visit: Payer: Self-pay

## 2021-05-01 DIAGNOSIS — I6521 Occlusion and stenosis of right carotid artery: Secondary | ICD-10-CM

## 2021-05-02 DIAGNOSIS — D510 Vitamin B12 deficiency anemia due to intrinsic factor deficiency: Secondary | ICD-10-CM | POA: Diagnosis not present

## 2021-05-16 DIAGNOSIS — E1129 Type 2 diabetes mellitus with other diabetic kidney complication: Secondary | ICD-10-CM | POA: Diagnosis not present

## 2021-05-16 DIAGNOSIS — N189 Chronic kidney disease, unspecified: Secondary | ICD-10-CM | POA: Diagnosis not present

## 2021-05-16 DIAGNOSIS — I10 Essential (primary) hypertension: Secondary | ICD-10-CM | POA: Diagnosis not present

## 2021-05-16 DIAGNOSIS — E1169 Type 2 diabetes mellitus with other specified complication: Secondary | ICD-10-CM | POA: Diagnosis not present

## 2021-05-16 DIAGNOSIS — E1165 Type 2 diabetes mellitus with hyperglycemia: Secondary | ICD-10-CM | POA: Diagnosis not present

## 2021-05-16 DIAGNOSIS — E039 Hypothyroidism, unspecified: Secondary | ICD-10-CM | POA: Diagnosis not present

## 2021-05-16 DIAGNOSIS — E785 Hyperlipidemia, unspecified: Secondary | ICD-10-CM | POA: Diagnosis not present

## 2021-05-16 DIAGNOSIS — E782 Mixed hyperlipidemia: Secondary | ICD-10-CM | POA: Diagnosis not present

## 2021-05-16 DIAGNOSIS — E1122 Type 2 diabetes mellitus with diabetic chronic kidney disease: Secondary | ICD-10-CM | POA: Diagnosis not present

## 2021-05-23 ENCOUNTER — Ambulatory Visit: Payer: Medicare HMO | Admitting: Podiatry

## 2021-06-01 ENCOUNTER — Other Ambulatory Visit: Payer: Self-pay

## 2021-06-01 ENCOUNTER — Ambulatory Visit (HOSPITAL_COMMUNITY)
Admission: RE | Admit: 2021-06-01 | Discharge: 2021-06-01 | Disposition: A | Discharge status: Home / Self Care | Payer: Medicare Other | Source: Ambulatory Visit | Attending: Vascular Surgery | Admitting: Vascular Surgery

## 2021-06-01 ENCOUNTER — Ambulatory Visit (INDEPENDENT_AMBULATORY_CARE_PROVIDER_SITE_OTHER): Payer: Medicare Other | Admitting: Physician Assistant

## 2021-06-01 VITALS — BP 145/92 | HR 99 | Temp 98.2°F | Resp 16 | Ht 62.0 in | Wt 187.7 lb

## 2021-06-01 DIAGNOSIS — I6523 Occlusion and stenosis of bilateral carotid arteries: Secondary | ICD-10-CM | POA: Diagnosis not present

## 2021-06-01 DIAGNOSIS — I6521 Occlusion and stenosis of right carotid artery: Secondary | ICD-10-CM

## 2021-06-01 NOTE — Progress Notes (Signed)
HISTORY AND PHYSICAL     CC:  follow up. Requesting Provider:  Hulan Fess, MD  HPI: This is a 70 y.o. female here for follow up for carotid artery stenosis.  She was seen in consultation in the hospital in November 2021.  She had undergone a GYN procedure and a few days later was noted to have numbness and tingling and swelling on the left side of her face and eye.  She had a full evaluation including an MRI of the brain which showed no stroke CT scan of the head which showed no bleed carotid duplex scan and CT angio of the neck.    She had not had any episodes of weakness in the upper or lower extremities.  She had not had any episodes of amaurosis.  She had no prior similar events.  She stated that she did have a lot of stress in her life at the time.  She has been evaluated in the past for arrhythmia by cardiology.  She had never had a myocardial infarction.  She had never had a prior stroke.  She had been on aspirin for several years. Other medical problems include diabetes, migraines, hypertension diabetes  Pt returns today for follow up.  She states that she has not had any temporary blindness, unilateral weakness or paralysis.  She does endorse having numbness both feet and hands that comes and goes.  She has not had any facial droop but states she did have some after her surgery in November.  She does take a daily asa.   She states that her PCP retired and she is having trouble getting a new one.    The pt is not on a statin for cholesterol management due to allergy.  She is on Repatha The pt is on a daily aspirin.   Other AC:  none The pt is not on medication for hypertension.   The pt is diabetic.   Tobacco hx:  never    Past Medical History:  Diagnosis Date   Allergy    Anemia    Arthritis    Asthma    Dianosed years ago-no meds at this time   Cancer [facility]) 2021   endometrial    Cataract    Right eye removed-still has left cataract    Chronic kidney disease 1997    Nepthrotic syndrom with minimal change-in remission per pt - last seen by kidney MD- 2 mos ago ? name of MD    Diabetes mellitus    type 2    Dyspnea    with exertion    Dysrhythmia    "skips beats"   Family history of adverse reaction to anesthesia    son stopped breathing during surgery , mother slow to wake up    Fibromyalgia 1980's   Generalized anxiety disorder    GERD (gastroesophageal reflux disease)    occ- will use Tums and Prolosec  prn    Heart murmur    Hyperlipemia    Hypertension    not on blood pressure meds due to allergies - last dose of meds- months ago    Hypothyroidism    Irritable bowel syndrome 1980's   Major depressive disorder    Migraine headaches 1980's   On meds-well controlled   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   Morbid obesity ([place])    Pneumonia 2009   Several times over the past several years   PONV (postoperative nausea and vomiting)    was told by neurologist due  to calcificaton in brain not to be put to sleep, N/V during Op and Recovery    Recurrent upper respiratory infection (URI)    Sleep apnea    no cpap   Subarachnoid hemorrhage ([place]) 2010   Vaginal polyp    benign per pt    Past Surgical History:  Procedure Laterality Date   CHOLECYSTECTOMY  1983   COLONOSCOPY  2008   DB   DILATION AND CURETTAGE OF UTERUS  1972   HYSTEROSCOPY WITH D & C  06/29/2011   Procedure: DILATATION AND CURETTAGE (D&C) /HYSTEROSCOPY;  Surgeon: Donnamae Jude, MD;  Location: [facility];  Service: Gynecology;  Laterality: N/A;   ROBOTIC ASSISTED TOTAL HYSTERECTOMY WITH BILATERAL SALPINGO OOPHERECTOMY N/A 10/27/2020   Procedure: XI ROBOTIC ASSISTED TOTAL HYSTERECTOMY WITH BILATERAL SALPINGO OOPHORECTOMY;  Surgeon: Everitt Amber, MD;  Location: [facility];  Service: Gynecology;  Laterality: N/A;   SENTINEL NODE BIOPSY N/A 10/27/2020   Procedure: SENTINEL NODE BIOPSY;  Surgeon: Everitt Amber, MD;  Location: [facility];  Service: Gynecology;  Laterality: N/A;    Allergies   Allergen Reactions   Acarbose    Atenolol     Stomach problems, chills   Cinnamon     Stomach pain   Glimepiride     ALL DIABETIC MEDS PER PT    Invokana [Canagliflozin]     ALL DIABETIC MEDS PER PT    Januvia [Sitagliptin]     ALL DIABETIC MEDS PER PT    Losartan Potassium    Metformin And Related Nausea And Vomiting and Other (See Comments)    Patient states that she has severe chills, headache and cramping additionally. Says blood sugar is uncontrolled because of this   Other Other (See Comments)    ALL DIABETIC MEDS PER PT    Pioglitazone Other (See Comments)    ALL DIABETIC MEDS PER PT    Statins     Myalgia   Sulfamethoxazole Other (See Comments)    ALL DIABETIC MEDS PER PT    Tramadol Hcl Itching   Sulfa Antibiotics Rash and Other (See Comments)    Mother has told patient in the past not to take, she cannot recall there reaction    Current Outpatient Medications  Medication Sig Dispense Refill   acetaminophen (TYLENOL) 500 MG tablet Take 500 mg by mouth every 6 (six) hours as needed for moderate pain.     amitriptyline (ELAVIL) 10 MG tablet Take 10 mg by mouth at bedtime.      aspirin 325 MG tablet Take 325 mg by mouth daily.     cholecalciferol (VITAMIN D3) 25 MCG (1000 UNIT) tablet Take 1,000 Units by mouth daily.     cyanocobalamin (,VITAMIN B-12,) 1000 MCG/ML injection Inject 1,000 mcg into the muscle every 30 (thirty) days.      diphenoxylate-atropine (LOMOTIL) 2.5-0.025 MG tablet Take 1 tablet by mouth 2 (two) times daily as needed for diarrhea or loose stools. 60 tablet 1   Evolocumab (REPATHA SURECLICK) 161 MG/ML SOAJ Inject 1 pen into the skin every 14 (fourteen) days. 6 pen 3   levothyroxine (SYNTHROID, LEVOTHROID) 50 MCG tablet Take 50 mcg by mouth daily.     pantoprazole (PROTONIX) 40 MG tablet Take 1 tablet (40 mg total) by mouth daily. 30 tablet 11   Potassium 99 MG TABS Take 198 mg by mouth daily.     alum & mag hydroxide-simeth (MAALOX MAX) 400-400-40  MG/5ML suspension Take 10 mLs by mouth every 6 (six) hours as needed  for indigestion. (Patient not taking: Reported on 06/01/2021) 355 mL 0   bumetanide (BUMEX) 1 MG tablet Take 1 mg by mouth every morning. (Patient not taking: Reported on 06/01/2021)     dicyclomine (BENTYL) 10 MG capsule Take 1 capsule (10 mg total) by mouth every 8 (eight) hours as needed for spasms. (Patient not taking: Reported on 06/01/2021) 90 capsule 11   No current facility-administered medications for this visit.    Family History  Problem Relation Age of Onset   Liver cancer Father        mets to liver    Bladder Cancer Father    Colon cancer Father        mets to colon    Anesthesia problems Mother        hard to wake post op    Cerebrovascular Disease Mother        Never had stroke, but had CEA.   High blood pressure Other    Diabetes Other    Arthritis Other    Anesthesia problems Son        stopped breathing post op    Tremor Son    Schizophrenia Daughter        Schizoaffective disorder   Anesthesia problems Granddaughter        PONV   Dementia Neg Hx    Esophageal cancer Neg Hx    Rectal cancer Neg Hx    Stomach cancer Neg Hx    Colon polyps Neg Hx     Social History   Socioeconomic History   Marital status: Divorced    Spouse name: Not on file   Number of children: 3   Years of education: 12   Highest education level: Not on file  Occupational History   Occupation: Retired   Tobacco Use   Smoking status: Never   Smokeless tobacco: Never  Vaping Use   Vaping Use: Never used  Substance and Sexual Activity   Alcohol use: No    Comment: quit 1978   Drug use: No   Sexual activity: Not Currently  Other Topics Concern   Not on file  Social History Narrative   Lives at home. Her granddaughter lives with her.    Right handed   Social Determinants of Health   Financial Resource Strain: Not on file  Food Insecurity: Not on file  Transportation Needs: Not on file  Physical Activity:  Not on file  Stress: Not on file  Social Connections: Not on file  Intimate Partner Violence: Not on file     REVIEW OF SYSTEMS:   [X]  denotes positive finding, [ ]  denotes negative finding Cardiac  Comments:  Chest pain or chest pressure:    Shortness of breath upon exertion:    Short of breath when lying flat:    Irregular heart rhythm:        Vascular    Pain in calf, thigh, or hip brought on by ambulation:    Pain in feet at night that wakes you up from your sleep:     Blood clot in your veins:    Leg swelling:         Pulmonary    Oxygen at home:    Productive cough:     Wheezing:         Neurologic    Sudden weakness in arms or legs:     Sudden numbness in arms or legs:     Sudden onset of difficulty speaking or slurred  speech:    Temporary loss of vision in one eye:     Problems with dizziness:         Gastrointestinal    Blood in stool:     Vomited blood:         Genitourinary    Burning when urinating:     Blood in urine:        Psychiatric    Major depression:         Hematologic    Bleeding problems:    Problems with blood clotting too easily:        Skin    Rashes or ulcers:        Constitutional    Fever or chills:      PHYSICAL EXAMINATION:  Today's Vitals   06/01/21 1412  BP: (!) 145/92  Pulse: 99  Resp: 16  Temp: 98.2 F (36.8 C)  TempSrc: Temporal  SpO2: 99%  Weight: 187 lb 11.2 oz (85.1 kg)  Height: 5\' 2"  (1.575 m)   Body mass index is 34.33 kg/m.   General:  WDWN in NAD; vital signs documented above Gait: Not observed HENT: WNL, normocephalic Pulmonary: normal non-labored breathing Cardiac: regular HR, without carotid bruits Skin: without rashes Vascular Exam/Pulses:  Right Left  Radial 2+ (normal) 2+ (normal)  Popliteal Unable to palpate Unable to palpate  DP 2+ (normal) 2+ (normal)  PT 1+ (weak) 1+ (weak)   Extremities: without ischemic changes, without Gangrene , without cellulitis; without open  wounds Musculoskeletal: no muscle wasting or atrophy  Neurologic: A&O X 3; moving all extremities equally; speech is fluent/slow Psychiatric:  The pt has Normal affect.   Non-Invasive Vascular Imaging:   Carotid Duplex on 06/02/2021: Right:  40-59% ICA stenosis Left:  1-39% ICA stenosis  Previous Carotid duplex on 10/29/2020: Right: 40-59% ICA stenosis Left:   1-39% ICA stenosis    ASSESSMENT/PLAN:: 70 y.o. female here for follow up carotid artery stenosis    -duplex today reveals the right ICA stenosis remains in the 40-59% range.  The left also remains stable in the 1-39% range.   -discussed s/s of stroke with pt and she understands should she develop any of these sx, she will go to the nearest ER or call 911. -pt will f/u in one year with carotid duplex -pt will call sooner should they have any issues. -continue Repatha/asa  -she was given numbers for [facility] and [place] to establish care with a PCP and treat her medical conditions including her diabetes.   Leontine Locket, [facility] [phone]  Clinic MD:  Oneida Alar.

## 2021-06-06 ENCOUNTER — Ambulatory Visit (INDEPENDENT_AMBULATORY_CARE_PROVIDER_SITE_OTHER): Payer: Medicare Other | Admitting: Podiatry

## 2021-06-06 ENCOUNTER — Encounter: Payer: Self-pay | Admitting: Podiatry

## 2021-06-06 ENCOUNTER — Other Ambulatory Visit: Payer: Self-pay

## 2021-06-06 DIAGNOSIS — M79674 Pain in right toe(s): Secondary | ICD-10-CM

## 2021-06-06 DIAGNOSIS — B351 Tinea unguium: Secondary | ICD-10-CM

## 2021-06-06 DIAGNOSIS — E1165 Type 2 diabetes mellitus with hyperglycemia: Secondary | ICD-10-CM

## 2021-06-06 DIAGNOSIS — M79675 Pain in left toe(s): Secondary | ICD-10-CM | POA: Diagnosis not present

## 2021-06-06 NOTE — Progress Notes (Signed)
This patient returns to my office for at risk foot care.  This patient requires this care by a professional since this patient will be at risk due to having diabetes and kidney disease. This patient is unable to cut nails herself since the patient cannot reach her nails.These nails are painful walking and wearing shoes.  This patient presents for at risk foot care today.  General Appearance  Alert, conversant and in no acute stress.  Vascular  Dorsalis pedis and posterior tibial  pulses are palpable  bilaterally.  Capillary return is within normal limits  bilaterally. Temperature is within normal limits  bilaterally.  Neurologic  Senn-Weinstein monofilament wire test within normal limits  bilaterally. Muscle power within normal limits bilaterally.  Nails Thick disfigured discolored nails with subungual debris  hallux nails bilaterally. No evidence of bacterial infection or drainage bilaterally.  Orthopedic  No limitations of motion  feet .  No crepitus or effusions noted.  No bony pathology or digital deformities noted.  HAV  B/L.  Midfoot  DJD  B/L.  Skin  normotropic skin with no porokeratosis noted bilaterally.  No signs of infections or ulcers noted.     Onychomycosis  Pain in right toes  Pain in left toes  Consent was obtained for treatment procedures.   Mechanical debridement of nails 1-5  bilaterally performed with a nail nipper.  Filed with dremel without incident.    Return office visit     3 months                 Told patient to return for periodic foot care and evaluation due to potential at risk complications.   Gardiner Barefoot DPM

## 2021-06-21 ENCOUNTER — Telehealth: Payer: Self-pay | Admitting: *Deleted

## 2021-06-21 NOTE — Telephone Encounter (Addendum)
Patient is calling to give the name of the anti nail fungus used previously:Terb 3%,fluc 2%,teatree oil 5%,urea 10% ibu 2 % in DMSO suspension #91ml. Please order.

## 2021-07-15 ENCOUNTER — Other Ambulatory Visit: Payer: Self-pay | Admitting: Interventional Cardiology

## 2021-07-15 ENCOUNTER — Other Ambulatory Visit: Payer: Self-pay | Admitting: Gastroenterology

## 2021-07-15 DIAGNOSIS — I25709 Atherosclerosis of coronary artery bypass graft(s), unspecified, with unspecified angina pectoris: Secondary | ICD-10-CM

## 2021-07-20 ENCOUNTER — Telehealth: Payer: Self-pay

## 2021-07-20 DIAGNOSIS — E7800 Pure hypercholesterolemia, unspecified: Secondary | ICD-10-CM

## 2021-07-20 NOTE — Telephone Encounter (Signed)
Pa appeal for repatha sureclick '140mg'$  biweekly faxed to [facility] [phone]. Will await determination.

## 2021-07-25 NOTE — Telephone Encounter (Signed)
Pt returned the callback and stated that they have new insurance and will bring insurance card to lab appt on 08/01/21 at 1130am at the chst office

## 2021-07-25 NOTE — Telephone Encounter (Signed)
Called and lmomed the pt that we need labs completed asap to appeal for repatha. Labs ordered just awaiting the pt to call back to schedule

## 2021-07-25 NOTE — Addendum Note (Signed)
Addended by: Allean Found on: 07/25/2021 08:22 AM   Modules accepted: Orders

## 2021-07-25 NOTE — Telephone Encounter (Signed)
Received request for more information for appeals for Repatha. Requesting LDL within the last 120 days. Last LDL available is from 10/29/20. Patient will need repeat labs.

## 2021-08-01 ENCOUNTER — Other Ambulatory Visit: Payer: Medicare Other

## 2021-08-03 ENCOUNTER — Other Ambulatory Visit: Payer: Self-pay

## 2021-08-03 NOTE — Patient Outreach (Signed)
A[facility]fully Program  08/03/2021  NOTA BEW [birth year] [id number]  [facility] Evaluation Interviewer made contact with patient. Patient declined services at this time.  Interviewer will send referral to Sharol Given for follow up.  [place] Management Assistant  [phone]

## 2021-08-08 ENCOUNTER — Other Ambulatory Visit: Payer: Medicare Other

## 2021-08-10 ENCOUNTER — Other Ambulatory Visit: Payer: Medicare Other

## 2021-08-11 ENCOUNTER — Other Ambulatory Visit: Payer: Medicare Other

## 2021-08-16 ENCOUNTER — Other Ambulatory Visit: Payer: Medicare Other | Admitting: *Deleted

## 2021-08-16 ENCOUNTER — Other Ambulatory Visit: Payer: Self-pay

## 2021-08-16 LAB — HEPATIC FUNCTION PANEL
ALT: 16 [IU]/L (ref 0–32)
AST: 26 [IU]/L (ref 0–40)
Albumin: 4 g/dL (ref 3.7–4.7)
Alkaline Phosphatase: 73 [IU]/L (ref 44–121)
Bilirubin Total: 0.6 mg/dL (ref 0.0–1.2)
Bilirubin, Direct: 0.18 mg/dL (ref 0.00–0.40)
Total Protein: 6.8 g/dL (ref 6.0–8.5)

## 2021-08-16 LAB — LIPID PANEL
Chol/HDL Ratio: 3.7 ratio (ref 0.0–4.4)
Cholesterol, Total: 134 mg/dL (ref 100–199)
HDL: 36 mg/dL — ABNORMAL LOW (ref >39)
LDL Chol Calc (NIH): 69 mg/dL (ref 0–99)
Triglycerides: 172 mg/dL — ABNORMAL HIGH (ref 0–149)
VLDL Cholesterol Cal: 29 mg/dL (ref 5–40)

## 2021-08-17 NOTE — Telephone Encounter (Signed)
Called and results given to pt and told pt that we are working on the pa and will update her as we know more

## 2021-09-06 ENCOUNTER — Telehealth: Payer: Self-pay

## 2021-09-06 NOTE — Telephone Encounter (Signed)
Returning call to [name] regarding vaginal bleeding. Left message requesting return call.

## 2021-09-07 NOTE — Telephone Encounter (Signed)
Patient called back and scheduled an appt with Melissa APP for 9/27

## 2021-09-12 ENCOUNTER — Inpatient Hospital Stay: Payer: Medicare Other | Attending: Gynecologic Oncology | Admitting: Gynecologic Oncology

## 2021-09-12 ENCOUNTER — Telehealth: Payer: Self-pay | Admitting: Neurology

## 2021-09-12 ENCOUNTER — Other Ambulatory Visit: Payer: Self-pay

## 2021-09-12 VITALS — BP 149/72 | HR 91 | Temp 98.3°F | Resp 18 | Ht 62.0 in | Wt 181.0 lb

## 2021-09-12 DIAGNOSIS — Z7901 Long term (current) use of anticoagulants: Secondary | ICD-10-CM | POA: Diagnosis not present

## 2021-09-12 DIAGNOSIS — E039 Hypothyroidism, unspecified: Secondary | ICD-10-CM | POA: Insufficient documentation

## 2021-09-12 DIAGNOSIS — Z8673 Personal history of transient ischemic attack (TIA), and cerebral infarction without residual deficits: Secondary | ICD-10-CM | POA: Diagnosis not present

## 2021-09-12 DIAGNOSIS — N898 Other specified noninflammatory disorders of vagina: Secondary | ICD-10-CM

## 2021-09-12 DIAGNOSIS — E1136 Type 2 diabetes mellitus with diabetic cataract: Secondary | ICD-10-CM | POA: Diagnosis not present

## 2021-09-12 DIAGNOSIS — C541 Malignant neoplasm of endometrium: Secondary | ICD-10-CM | POA: Diagnosis not present

## 2021-09-12 DIAGNOSIS — E785 Hyperlipidemia, unspecified: Secondary | ICD-10-CM | POA: Diagnosis not present

## 2021-09-12 DIAGNOSIS — N189 Chronic kidney disease, unspecified: Secondary | ICD-10-CM | POA: Insufficient documentation

## 2021-09-12 DIAGNOSIS — N939 Abnormal uterine and vaginal bleeding, unspecified: Secondary | ICD-10-CM | POA: Insufficient documentation

## 2021-09-12 DIAGNOSIS — Z90722 Acquired absence of ovaries, bilateral: Secondary | ICD-10-CM | POA: Diagnosis not present

## 2021-09-12 DIAGNOSIS — R42 Dizziness and giddiness: Secondary | ICD-10-CM | POA: Diagnosis not present

## 2021-09-12 DIAGNOSIS — Z8542 Personal history of malignant neoplasm of other parts of uterus: Secondary | ICD-10-CM | POA: Insufficient documentation

## 2021-09-12 DIAGNOSIS — E1122 Type 2 diabetes mellitus with diabetic chronic kidney disease: Secondary | ICD-10-CM | POA: Insufficient documentation

## 2021-09-12 DIAGNOSIS — J45909 Unspecified asthma, uncomplicated: Secondary | ICD-10-CM | POA: Diagnosis not present

## 2021-09-12 DIAGNOSIS — K219 Gastro-esophageal reflux disease without esophagitis: Secondary | ICD-10-CM | POA: Diagnosis not present

## 2021-09-12 DIAGNOSIS — Z90710 Acquired absence of both cervix and uterus: Secondary | ICD-10-CM | POA: Diagnosis not present

## 2021-09-12 DIAGNOSIS — I129 Hypertensive chronic kidney disease with stage 1 through stage 4 chronic kidney disease, or unspecified chronic kidney disease: Secondary | ICD-10-CM | POA: Insufficient documentation

## 2021-09-12 NOTE — Patient Instructions (Addendum)
We took a biopsy from the vagina today. Dr. Denman George has a low suspicion that this is cancer. It is likely prolapse from where the bladder has started to drop. We will call you with the results. You may have some spotting and gray discharge from the medication used to stop the bleeding today. Plan to follow up as planned or sooner if needed. Please call for any questions or concerns.

## 2021-09-12 NOTE — Progress Notes (Signed)
GYN Oncology Symptom Management  Chief Complaint  Patient presents with   Vaginal Bleeding   Assessment/Plan: [name]  is a 70 y.o.  year old, P3, with stage IA grade 1 endometrial cancer, MMR deficient, (hypermethylation present), s/p surgical staging on 10/27/20. Pathology revealed low risk factors for recurrence, therefore no adjuvant therapy is recommended according to NCCN guidelines.  She presents today for new vaginal bleeding. Grade 3 prolapse noted on examination. Biopsy taken of erythematous lesion at the cuff by Dr. Emma Rossi. Our office will contact the patient with the results. We will also send her information about obtaining a new PCP. Our office contacted the neurology office as well to obtain an appt given her increased dizziness. Their office states they will contact her. I informed the patient that per the neurology office, her neurology care had been transferred to [place] since a cause for her dizziness had not been identified on an extensive workup.   If the biopsy is negative, she will be advised to continue follow-up every 6 months for 5 years in accordance with NCCN guidelines. Those visits should include symptom assessment, physical exam and pelvic examination. Pap smears are not indicated or recommended in the routine surveillance of endometrial cancer. She is advised to call for any needs or concerns.   HPI: [name] is a 70 year old, P 3, initially seen in consultation at the request of Dr. Davis for evaluation and treatment of grade 1 endometrial cancer (MMR abnormal). She developed postmenopausal bleeding in late September 2021. She was seen by Dr. Davis for a new patient consultation on 08/26/20 with work-up of symptoms including a transvaginal US and endometrial biopsy. Transvaginal US on 09/01/20 showed a uterus measuring 9.4 x 3.6 x 5.4 cm with an endometrial thickness of 6 mm.  The ovaries were grossly normal bilaterally. Endometrial sampling  with endometrial pipelle biopsy was performed on 09/19/20 and showed 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   Her medical history is remarkable for a fall in 2010 which resulted in a subarachnoid hemorrhage and a head injury.  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  She has been having issues in the recent year with word finding difficulty and weakness for which she has had extensive neurologic work-up.  This included head imaging and it was felt that she had progressive calcifications in her brain.  She had been told in the past that she could not have general anesthesia due to her prior brain injury.  Additionally she is known to have coronary artery calcifications consistent with coronary artery disease in addition to history of a dysrhythmia for which she sees Dr. Varanasi.   She has a history of nephrotic syndrome and chronic kidney disease.  She has a history of diabetes mellitus, type II, for which she was prescribed multiple oral antiglycemic's in the past none of which she tolerated.  She declined taking insulin.  Her last hemoglobin A1c was 7.2% in November 2021.  Other medical problems include hypothyroidism, hypertension, hyperlipidemia, gastroesophageal reflux, and asthma. She lives with her granddaughter who is 18 years old.  An MRI was performed of the pelvis on 10/10/2020 and showed a deeply invasive myometrial extending tumor from the endometrium.  There was no apparent lymphadenopathy or extrauterine disease present, however this appeared to be at least clinical stage Ib.  She had seen her cardiologist for optimization of her stratification and was felt to   be a good candidate for surgery by him.  Her neurologist was contacted regarding candidacy for surgery and Trendelenburg positioning with her prior intracranial calcifications.  Her neurologist declined to comment on risk for this.  She decided  to proceed with definitive surgical staging.  On 10/27/20, she underwent robotic assisted total hysterectomy, BSO, SLN biopsy. Intraoperative findings were significant for a 10cm fibroid uterus with anterior prolapse, suspicious left external iliac SLN, normal peritoneum. Surgery was uncomplicated.  Final pathology revealed a FIGO grade 1 endometrioid adenocarcinoma of the endometrium, there was 2 of 17mm myometrial invasion, no LVSI and negative cervix and adnexa and lymph nodes. MMR testing showed loss of expression of MLH1 and PMS2. 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   Following surgery she developed symptoms of a left facial droop and was admitted to [place] for observation.  Comprehensive neurologic testing failed to demonstrate evidence of a CVA. She was empirically prescribed plavix, however counseled to avoid this in at least the first week after surgery.   Interval Hx: She presents today to the office for evaluation of vaginal bleeding. She states she noted vaginal spotting 2.5 weeks ago when she wiped. The bleeding was bright red. She has noted left abdominal pain that was present before surgery. She has intermittent abdominal cramping which could be related to her frequent loose stools. She states she sees a gastroenterologist but the diarrhea has worsened. She also reports recent falls and increase in dizziness. She states she has called her neurologist and does not have a follow up appointment at this time. No change in her bladder habits. She has frequent urination at night. No hematuria or dysuria. She reports taking in adequate hydration. No other symptoms concerning for recurrence reported. She is requesting a recommendation for a new PCP.   Current Meds:  Outpatient Encounter Medications as of 09/12/2021  Medication Sig   acetaminophen (TYLENOL) 500 MG tablet Take 500 mg by mouth every 6 (six) hours as needed for moderate pain.   alum & mag  hydroxide-simeth (MAALOX MAX) 400-400-40 MG/5ML suspension Take 10 mLs by mouth every 6 (six) hours as needed for indigestion.   amitriptyline (ELAVIL) 10 MG tablet Take 10 mg by mouth at bedtime.    aspirin 325 MG tablet Take 325 mg by mouth daily.   bumetanide (BUMEX) 1 MG tablet Take 1 mg by mouth every morning.   cholecalciferol (VITAMIN D3) 25 MCG (1000 UNIT) tablet Take 1,000 Units by mouth daily.   cyanocobalamin (,VITAMIN B-12,) 1000 MCG/ML injection Inject 1,000 mcg into the muscle every 30 (thirty) days.    diphenoxylate-atropine (LOMOTIL) 2.5-0.025 MG tablet TAKE 1 TABLET BY MOUTH TWICE DAILY AS NEEDED FOR  DIARRHEA  OR  LOOSE  STOOLS   levothyroxine (SYNTHROID, LEVOTHROID) 50 MCG tablet Take 50 mcg by mouth daily.   pantoprazole (PROTONIX) 40 MG tablet Take 1 tablet (40 mg total) by mouth daily.   REPATHA SURECLICK 140 MG/ML SOAJ INJECT 1 PEN SUBCUTANEOUSLY EVERY 14 DAYS   dicyclomine (BENTYL) 10 MG capsule Take 1 capsule (10 mg total) by mouth every 8 (eight) hours as needed for spasms. (Patient not taking: No sig reported)   Potassium 99 MG TABS Take 198 mg by mouth daily. (Patient not taking: Reported on 09/12/2021)   No facility-administered encounter medications on file as of 09/12/2021.    Allergy:  Allergies  Allergen Reactions   Acarbose    Atenolol     Stomach problems, chills   Cinnamon       Stomach pain   Glimepiride     ALL DIABETIC MEDS PER PT    Invokana [Canagliflozin]     ALL DIABETIC MEDS PER PT    Januvia [Sitagliptin]     ALL DIABETIC MEDS PER PT    Losartan Potassium    Metformin And Related Nausea And Vomiting and Other (See Comments)    Patient states that she has severe chills, headache and cramping additionally. Says blood sugar is uncontrolled because of this   Other Other (See Comments)    ALL DIABETIC MEDS PER PT    Pioglitazone Other (See Comments)    ALL DIABETIC MEDS PER PT    Statins     Myalgia   Sulfamethoxazole Other (See Comments)     ALL DIABETIC MEDS PER PT    Tramadol Hcl Itching   Sulfa Antibiotics Rash and Other (See Comments)    Mother has told patient in the past not to take, she cannot recall there reaction    Social Hx:   Social History   Socioeconomic History   Marital status: Divorced    Spouse name: Not on file   Number of children: 3   Years of education: 12   Highest education level: Not on file  Occupational History   Occupation: Retired   Tobacco Use   Smoking status: Never   Smokeless tobacco: Never  Vaping Use   Vaping Use: Never used  Substance and Sexual Activity   Alcohol use: No    Comment: quit 1978   Drug use: No   Sexual activity: Not Currently  Other Topics Concern   Not on file  Social History Narrative   Lives at home. Her granddaughter lives with her.    Right handed   Social Determinants of Health   Financial Resource Strain: Not on file  Food Insecurity: Not on file  Transportation Needs: Not on file  Physical Activity: Not on file  Stress: Not on file  Social Connections: Not on file  Intimate Partner Violence: Not on file    Past Surgical Hx:  Past Surgical History:  Procedure Laterality Date   CHOLECYSTECTOMY  1983   COLONOSCOPY  2008   DB   DILATION AND CURETTAGE OF UTERUS  1972   HYSTEROSCOPY WITH D & C  06/29/2011   Procedure: DILATATION AND CURETTAGE (D&C) /HYSTEROSCOPY;  Surgeon: Tanya S Pratt, MD;  Location: [facility];  Service: Gynecology;  Laterality: N/A;   ROBOTIC ASSISTED TOTAL HYSTERECTOMY WITH BILATERAL SALPINGO OOPHERECTOMY N/A 10/27/2020   Procedure: XI ROBOTIC ASSISTED TOTAL HYSTERECTOMY WITH BILATERAL SALPINGO OOPHORECTOMY;  Surgeon: Rossi, Emma, MD;  Location: [facility];  Service: Gynecology;  Laterality: N/A;   SENTINEL NODE BIOPSY N/A 10/27/2020   Procedure: SENTINEL NODE BIOPSY;  Surgeon: Rossi, Emma, MD;  Location: [facility];  Service: Gynecology;  Laterality: N/A;    Past Medical Hx:  Past Medical History:  Diagnosis Date   Allergy     Anemia    Arthritis    Asthma    Dianosed years ago-no meds at this time   Cancer (HCC) 2021   endometrial    Cataract    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 name of MD    Diabetes mellitus    type 2    Dyspnea    with exertion    Dysrhythmia    "  skips beats"   Family history of adverse reaction to anesthesia    son stopped breathing during surgery , mother slow to wake up    Fibromyalgia 1980's   Generalized anxiety disorder    GERD (gastroesophageal reflux disease)    occ- will use Tums and Prolosec  prn    Heart murmur    Hyperlipemia    Hypertension    not on blood pressure meds due to allergies - last dose of meds- months ago    Hypothyroidism    Irritable bowel syndrome 1980's   Major depressive disorder    Migraine headaches 1980's   On meds-well controlled   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 Patient is postmenopausal.  Family Hx:  Family History  Problem Relation Age of Onset   Liver cancer Father        mets to liver    Bladder Cancer Father    Colon cancer Father        mets to colon    Anesthesia problems Mother        hard to wake post op    Cerebrovascular Disease Mother        Never had stroke, but had CEA.   High blood pressure Other    Diabetes Other    Arthritis Other    Anesthesia problems Son        stopped breathing post op    Tremor Son    Schizophrenia Daughter         Schizoaffective disorder   Anesthesia problems Granddaughter        PONV   Dementia Neg Hx    Esophageal cancer Neg Hx    Rectal cancer Neg Hx    Stomach cancer Neg Hx    Colon polyps Neg Hx     Review of Systems: Constitutional: Feels weak at times. Intermittent falls reported.  ENT: Worsening dizziness. Normal appearing ears and nares bilaterally Skin/Breast: No new rash, sores, jaundice, itching, dryness Cardiovascular: Hx dysrhythmia Pulmonary: No cough or wheeze.  Gastro Intestinal: Positive for worsening diarrhea. No nausea, vomiting. No bright red blood per rectum. Genito-Urinary: Positive for vaginal spotting. No frequency, urgency, dysuria. Musculo Skeletal: + arthritis Neurologic: Worsening dizziness, + tremor and word finding difficulty hx, has neurologist.  Psychology: No new changes.   Vitals:  Blood pressure (!) 149/72, pulse 91, temperature 98.3 F (36.8 C), temperature source Oral, resp. rate 18, height 5' 2" (1.575 m), weight 181 lb (82.1 kg), SpO2 100 %.  Physical Exam: Alert, oriented, in no acute distress.  Lymph Node Survey: No adenopathy palpated. Cardiovascular: Pulse normal rate, regularity and rhythm. S1 and S2 normal.  Lungs: Clear to auscultation bilateraly, without wheezes/crackles/rhonchi. Good air movement.  Skin: No new rash/lesions/breakdown   Abdomen: Normoactive bowel sounds, abdomen soft, non-tender and obese without evidence of hernia. Soft incisions.  Genito-Urinary: No visible lesions on the vulva. Grade 3 prolapse to the introitus on exam. No nodularity with smooth   mucosa. Erythema at the cuff corresponding to prolapsed area. With patient permission, a biopsy was taken from the erythema at the cuff. Hemostasis achieved with silver nitrate application. No palpable masses on bimanual. Dr. Denman George to the room to exam the patient as well given the significant changes in the vagina from her previous exam.  Extremities: No bilateral cyanosis, clubbing  or edema.  Dorothyann Gibbs, NP  09/14/2021, 2:39 PM

## 2021-09-12 NOTE — Telephone Encounter (Signed)
Gyn/oncology Natale Milch) patient is at our office. She is experiencing dizziness and falling. Would like a call from the nurse to discuss a sooner appt or work in.

## 2021-09-13 ENCOUNTER — Telehealth: Payer: Self-pay

## 2021-09-13 LAB — SURGICAL PATHOLOGY

## 2021-09-13 NOTE — Telephone Encounter (Signed)
Spoke with Ms. Ainley regarding her biopsy results from 09/12/21. Per Joylene John, NP biopsy is showing inflammation no cancer. Recommends patting vulva after urination and not rubbing it. Patient verbalized understanding.  Patient reports she needs a referral for a new primary care doctor and needs assistance with this. She states her vagina is completely out and prolapsing. Melissa Cross,NP notified.

## 2021-09-13 NOTE — Telephone Encounter (Signed)
Left message for patient requesting return call. Patient has a new patient appt scheduled on 10/20/21 at 1pm with Alyssa Allward, PA.  Per Joylene John, NP patient needs to follow up with her gynecologist Dr. Rosana Hoes in regards to managing her prolapse.

## 2021-09-14 NOTE — Telephone Encounter (Signed)
Spoke with [name] regarding her new patient appointment with Theresa Duty, PA on 10/20/21. Patient is in agreement of date and time of appointment and is appreciative of assistance. Patient is going to follow up with Dr. Rosana Hoes regarding vaginal symptoms. Instructed patient to call for any questions or concerns.

## 2021-10-18 ENCOUNTER — Ambulatory Visit (INDEPENDENT_AMBULATORY_CARE_PROVIDER_SITE_OTHER): Payer: Medicare Other | Admitting: Podiatry

## 2021-10-18 ENCOUNTER — Encounter: Payer: Self-pay | Admitting: Podiatry

## 2021-10-18 ENCOUNTER — Other Ambulatory Visit: Payer: Self-pay

## 2021-10-18 DIAGNOSIS — B351 Tinea unguium: Secondary | ICD-10-CM | POA: Diagnosis not present

## 2021-10-18 DIAGNOSIS — M79674 Pain in right toe(s): Secondary | ICD-10-CM

## 2021-10-18 DIAGNOSIS — E1165 Type 2 diabetes mellitus with hyperglycemia: Secondary | ICD-10-CM

## 2021-10-18 DIAGNOSIS — M79675 Pain in left toe(s): Secondary | ICD-10-CM

## 2021-10-18 NOTE — Progress Notes (Signed)
This patient returns to my office for at risk foot care.  This patient requires this care by a professional since this patient will be at risk due to having diabetes and kidney disease. This patient is unable to cut nails herself since the patient cannot reach her nails.These nails are painful walking and wearing shoes.  This patient presents for at risk foot care today.  General Appearance  Alert, conversant and in no acute stress.  Vascular  Dorsalis pedis and posterior tibial  pulses are palpable  bilaterally.  Capillary return is within normal limits  bilaterally. Temperature is within normal limits  bilaterally.  Neurologic  Senn-Weinstein monofilament wire test within normal limits  bilaterally. Muscle power within normal limits bilaterally.  Nails Thick disfigured discolored nails with subungual debris  hallux nails bilaterally. No evidence of bacterial infection or drainage bilaterally.  Orthopedic  No limitations of motion  feet .  No crepitus or effusions noted.  No bony pathology or digital deformities noted.  HAV  B/L.  Midfoot  DJD  B/L.  Skin  normotropic skin with no porokeratosis noted bilaterally.  No signs of infections or ulcers noted.     Onychomycosis  Pain in right toes  Pain in left toes  Consent was obtained for treatment procedures.   Mechanical debridement of nails 1-5  bilaterally performed with a nail nipper.  Filed with dremel without incident.    Return office visit     3 months                 Told patient to return for periodic foot care and evaluation due to potential at risk complications.   Gardiner Barefoot DPM

## 2021-10-20 ENCOUNTER — Other Ambulatory Visit: Payer: Self-pay

## 2021-10-20 ENCOUNTER — Ambulatory Visit (INDEPENDENT_AMBULATORY_CARE_PROVIDER_SITE_OTHER): Payer: Medicare Other | Admitting: Physician Assistant

## 2021-10-20 VITALS — BP 151/90 | HR 75 | Temp 98.4°F | Ht 62.0 in | Wt 177.2 lb

## 2021-10-20 DIAGNOSIS — E119 Type 2 diabetes mellitus without complications: Secondary | ICD-10-CM

## 2021-10-20 DIAGNOSIS — E782 Mixed hyperlipidemia: Secondary | ICD-10-CM | POA: Diagnosis not present

## 2021-10-20 DIAGNOSIS — G250 Essential tremor: Secondary | ICD-10-CM

## 2021-10-20 DIAGNOSIS — E039 Hypothyroidism, unspecified: Secondary | ICD-10-CM

## 2021-10-20 DIAGNOSIS — G238 Other specified degenerative diseases of basal ganglia: Secondary | ICD-10-CM

## 2021-10-20 DIAGNOSIS — C541 Malignant neoplasm of endometrium: Secondary | ICD-10-CM

## 2021-10-20 DIAGNOSIS — R296 Repeated falls: Secondary | ICD-10-CM

## 2021-10-20 DIAGNOSIS — Z8669 Personal history of other diseases of the nervous system and sense organs: Secondary | ICD-10-CM

## 2021-10-20 NOTE — Progress Notes (Signed)
Subjective:    Patient ID: [name], female    DOB: [birth year], 70 y.o.   MRN: [id number]  Chief Complaint  Patient presents with   Establish Care    HPI Patient with complicated medical history is in today for new patient establishment. Her previous PCP retired.  -Pt lives alone in her own home. Granddaughter goes back and forth between her house and her mother's.   Acute concerns: (She has a hand-written list for me that is not small, but very shaky) -Extremely dizzy, falling more often than normal, involuntary tremors (for as long as she can remember, but says it is worse); holding onto furniture so she doesn't fall  -Shaking and trembling while trying to write -Decrease in hearing, pain on left side of face and head -Extensive work-up done in the past by Dr. Jaynee Eagles & was referred to Dr. Buck Mam, movement disorder specialist, at [facility].  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 name of MD    Diabetes mellitus    type 2    Dyspnea    with exertion    Dysrhythmia    "skips beats"   Family history of adverse reaction to anesthesia    son stopped breathing during surgery , mother slow to wake up    Fibromyalgia 1980's   Generalized anxiety disorder    GERD (gastroesophageal reflux disease)    occ- will use Tums and Prolosec  prn    Heart murmur    Hyperlipemia    Hypertension    not on blood pressure meds due to allergies - last dose of meds- months ago    Hypothyroidism    Irritable bowel syndrome 1980's   Major depressive disorder    Migraine headaches 1980's   On meds-well controlled   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   Morbid obesity ([place])    Pneumonia 2009   Several times over the past several years   PONV (postoperative nausea and vomiting)    was told by neurologist due to calcificaton in brain not to be put to sleep, N/V during Op and Recovery    Recurrent upper respiratory infection (URI)    Sleep apnea    no cpap   Subarachnoid hemorrhage ([place]) 2010   Vaginal polyp    benign per pt    Past Surgical History:  Procedure Laterality Date   CHOLECYSTECTOMY  1983   COLONOSCOPY  2008   DB   DILATION AND CURETTAGE OF UTERUS  1972   HYSTEROSCOPY WITH D & C  06/29/2011   Procedure: DILATATION AND CURETTAGE (D&C) /HYSTEROSCOPY;  Surgeon: Donnamae Jude, MD;  Location: [facility];  Service: Gynecology;  Laterality: N/A;   ROBOTIC ASSISTED TOTAL HYSTERECTOMY WITH BILATERAL SALPINGO OOPHERECTOMY N/A 10/27/2020   Procedure: XI ROBOTIC ASSISTED TOTAL HYSTERECTOMY WITH BILATERAL SALPINGO OOPHORECTOMY;  Surgeon: Everitt Amber, MD;  Location: [facility];  Service: Gynecology;  Laterality: N/A;   SENTINEL NODE BIOPSY N/A 10/27/2020   Procedure: SENTINEL NODE BIOPSY;  Surgeon: Everitt Amber, MD;  Location: [facility];  Service: Gynecology;  Laterality: N/A;    Family History  Problem Relation Age of Onset   Liver cancer Father        mets to liver    Bladder Cancer Father    Colon cancer Father        mets to colon    Anesthesia problems Mother        hard to wake post op    Cerebrovascular Disease Mother        Never had stroke, but had CEA.   High blood pressure Other    Diabetes Other    Arthritis Other    Anesthesia problems Son        stopped breathing post op    Tremor Son    Schizophrenia Daughter        Schizoaffective disorder   Anesthesia problems Granddaughter        PONV   Dementia Neg Hx    Esophageal cancer Neg Hx    Rectal cancer Neg Hx    Stomach cancer Neg Hx    Colon polyps Neg Hx     Social History   Tobacco Use   Smoking status: Never   Smokeless tobacco: Never   Vaping Use   Vaping Use: Never used  Substance Use Topics   Alcohol use: No    Comment: quit 1978   Drug use: No     Allergies  Allergen Reactions   Acarbose    Atenolol     Stomach problems, chills   Cinnamon     Stomach pain   Glimepiride     ALL DIABETIC MEDS PER PT    Invokana [Canagliflozin]     ALL DIABETIC MEDS PER PT    Januvia [Sitagliptin]     ALL DIABETIC MEDS PER PT    Losartan Potassium    Metformin And Related Nausea And Vomiting and Other (See Comments)    Patient states that she has severe chills, headache and cramping additionally. Says blood sugar is uncontrolled because of this   Other Other (See Comments)    ALL DIABETIC MEDS PER PT    Pioglitazone Other (See Comments)    ALL DIABETIC MEDS PER PT    Statins     Myalgia   Sulfamethoxazole Other (See Comments)    ALL DIABETIC MEDS PER PT    Tramadol Hcl Itching   Sulfa Antibiotics Rash and Other (See Comments)    Mother has told patient in the past not to take, she cannot recall there reaction    Review of Systems REFER TO HPI FOR PERTINENT POSITIVES AND NEGATIVES      Objective:     BP (!) 151/90   Pulse 75   Temp 98.4 F (36.9 C)   Ht 5\' 2"  (1.575 m)   Wt 177 lb 3.2 oz (80.4 kg)   SpO2 98%   BMI 32.41 kg/m   Wt Readings from Last 3 Encounters:  10/20/21 177 lb 3.2 oz (80.4 kg)  09/12/21 181 lb (82.1 kg)  06/01/21 187 lb 11.2 oz (85.1 kg)    BP Readings from Last 3 Encounters:  10/20/21 (!) 151/90  09/12/21 (!) 149/72  06/01/21 (!) 145/92     Physical Exam Vitals and nursing note reviewed.  Constitutional:      Appearance: Normal appearance. She is normal weight. She is not toxic-appearing.  HENT:     Head: Normocephalic and atraumatic.     Right Ear: Tympanic membrane, ear canal and  external ear normal.     Left Ear: Tympanic membrane, ear canal and external ear normal.     Nose: Nose normal.     Mouth/Throat:     Mouth: Mucous membranes are moist.  Eyes:      Extraocular Movements: Extraocular movements intact.     Conjunctiva/sclera: Conjunctivae normal.     Pupils: Pupils are equal, round, and reactive to light.  Cardiovascular:     Rate and Rhythm: Normal rate and regular rhythm.     Pulses: Normal pulses.     Heart sounds: Normal heart sounds.  Pulmonary:     Effort: Pulmonary effort is normal.     Breath sounds: Normal breath sounds.  Abdominal:     General: Abdomen is flat. Bowel sounds are normal.     Palpations: Abdomen is soft.     Tenderness: There is no right CVA tenderness or left CVA tenderness.  Musculoskeletal:        General: Normal range of motion.     Cervical back: Normal range of motion and neck supple.  Skin:    General: Skin is warm and dry.  Neurological:     General: No focal deficit present.     Mental Status: She is alert and oriented to person, place, and time.     Motor: Weakness (diffuse) and tremor present.     Gait: Tandem walk abnormal.     Comments: Patient did fall as she was getting down from the exam table today, even with my assistance. Her right leg seemed to just "give out" on her and she fell to her left side. She hit her head on the wall, but was able to get back up with assistance quickly and she did not lose consciousness. She was monitored in the office for an additional hour and discharged home in stable condition after no noticeable pain or focal neurologic deficits noted.        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 History of cyst of brain 3.  Essential tremor 4. Recurrent falls -Very extensive neurologic history, work-up done with Dr. Jaynee Eagles, which I have personally reviewed. She is supposed to be following up with Dr. Buck Mam in February 2023.  -Most recent MR brain w/o contrast done 10/28/2020, showed no acute findings, normal intracranial MRA, and chronic small vessel disease and volume loss -MRI Brain W/wo Contrast 01/25/2001 IMPRESSION  STABLE 1.5 X 2 CM. LEFT PETROUS APEX LESION.  -I am concerned about her gait and balance and the fact that she lives alone. I strongly encouraged her to keep a cell phone on her at all times, as she stated she looked into [employer] and could not afford it. -I will also place order for home PT/OT to help with fall preventions.  5. Diabetes mellitus type II, non insulin dependent [facility]) Lab Results  Component Value Date  HGBA1C 7.2 (H) 10/29/2020   -Previous look at her chart reveals that she has tried numerous oral antihyperglycemics and failed these. -Diet / exercise controlled at this time. -Sees Dr. Prudence Davidson, podiatrist  6. Endometrial cancer ([place]) -Sees Dr. Denman George -Sx's started with postmenopausal bleeding in late Sept 2021 -10/27/20 total hysterectomy, BSO, SLN biopsy   7. Hypothyroidism, unspecified type Lab Results  Component Value Date   TSH 0.917 02/26/2020   -Needs labs updated -Currently taking Synthroid 50 mcg daily  8. Mixed dyslipidemia -Currently on Repatha & f/up with cholesterol clinic     This note was prepared with assistance of Dragon voice recognition software. Occasional wrong-word or sound-a-like substitutions may have occurred due to the inherent limitations of voice recognition software.  Time Spent: 50 minutes of total time was spent on the date of the encounter performing the following actions: chart review prior to seeing the patient, obtaining history, performing a medically necessary exam, counseling on the treatment plan, placing orders, and  documenting in our EHR.    Yesha Muchow M Davonn Flanery, PA-C

## 2021-10-24 ENCOUNTER — Telehealth: Payer: Self-pay

## 2021-10-24 NOTE — Telephone Encounter (Signed)
Received call from [name]. Patient reports having an increased amount of discharge for 1 week. She is wearing a pad and changing twice daily. She reports odor, a liquid consistency with a creamy to clear color and some cramping "like a period". Denies difficulty urinating, fevers or chills. Patient states " My vagina fell and I have a red sore on it. The last time I saw Dr. Denman George she told me to see my GYN but I haven't."  Isabella John, NP notified. Instructed patient to follow up with her GYN. Patient verbalized understanding. Instructed to call with any questions or concerns.

## 2021-10-24 NOTE — Patient Instructions (Signed)
-  Order placed to home health services -Labs need to be updated -Monitor symptoms after the fall today. Go straight to ED if any sudden changes from baseline.

## 2021-10-27 DIAGNOSIS — E538 Deficiency of other specified B group vitamins: Secondary | ICD-10-CM | POA: Diagnosis not present

## 2021-10-30 ENCOUNTER — Telehealth: Payer: Self-pay

## 2021-10-30 NOTE — Telephone Encounter (Signed)
Left message for [name] requesting return call. Re: follow up appointment with Dr. Delsa Sale.

## 2021-10-30 NOTE — Telephone Encounter (Signed)
Received call from [name]. Patient following up on vaginal discharge, vaginal prolapse, odor, cramping and new abdominal pain. Patient has called her gynecologist Dr. Vivien Rota and the first available appointment is the end of December. Patient is concerned with waiting this long for an appointment.  Patient states her biopsies from September were non-cancerous. However with her symptoms she wants to ensure that it is not cancer.

## 2021-10-30 NOTE — Telephone Encounter (Signed)
Following up with [name]. Offered patient an appointment with Dr. Delsa Sale on 11/22/21. Patient states she was able to move up her appointment with her GYN up to 11/08/21. Instructed to call with any questions or concerns.

## 2021-11-01 ENCOUNTER — Other Ambulatory Visit: Payer: Self-pay | Admitting: Interventional Cardiology

## 2021-11-01 DIAGNOSIS — I25709 Atherosclerosis of coronary artery bypass graft(s), unspecified, with unspecified angina pectoris: Secondary | ICD-10-CM

## 2021-11-01 NOTE — Telephone Encounter (Signed)
*  STAT* If patient is at the pharmacy, call can be transferred to refill team.   1. Which medications need to be refilled? (please list name of each medication and dose if known)REPATHA SURECLICK 159 MG/ML SOAJ  2. Which pharmacy/location (including street and city if local pharmacy) is medication to be sent to? [place], [place].  3. Do they need a 30 day or 90 day supply? 30 day

## 2021-11-08 ENCOUNTER — Ambulatory Visit (INDEPENDENT_AMBULATORY_CARE_PROVIDER_SITE_OTHER): Payer: Medicare Other | Admitting: Family Medicine

## 2021-11-08 ENCOUNTER — Other Ambulatory Visit (HOSPITAL_COMMUNITY)
Admission: RE | Admit: 2021-11-08 | Discharge: 2021-11-08 | Disposition: A | Discharge status: Home / Self Care | Payer: Medicare Other | Source: Ambulatory Visit | Attending: Family Medicine | Admitting: Family Medicine

## 2021-11-08 ENCOUNTER — Other Ambulatory Visit: Payer: Self-pay

## 2021-11-08 VITALS — BP 155/85 | HR 97 | Wt 177.1 lb

## 2021-11-08 DIAGNOSIS — N8110 Cystocele, unspecified: Secondary | ICD-10-CM

## 2021-11-08 DIAGNOSIS — N898 Other specified noninflammatory disorders of vagina: Secondary | ICD-10-CM | POA: Diagnosis not present

## 2021-11-08 DIAGNOSIS — N760 Acute vaginitis: Secondary | ICD-10-CM | POA: Diagnosis not present

## 2021-11-08 MED ORDER — METRONIDAZOLE 500 MG PO TABS: 500.0000 mg | ORAL_TABLET | Freq: Two times a day (BID) | ORAL | 0 refills | Status: DC

## 2021-11-08 MED ORDER — CIPROFLOXACIN HCL 500 MG PO TABS: 500.0000 mg | ORAL_TABLET | Freq: Two times a day (BID) | ORAL | 0 refills | Status: DC

## 2021-11-08 NOTE — Progress Notes (Signed)
   Subjective:    Patient ID: [name], female    DOB: [birth year], 70 y.o.   MRN: [id number]  HPI 70 year old patient with a history of grade 1 endometrioid carcinoma and is approximately 1 year status post robotic assisted vaginal hysterectomy by Dr. Denman George.  She has had an uncomplicated recovery.  She was seen in the beginning of September due to pain and vaginal prolapse.  On vaginal exam, she had biopsy of an erythematous area of the vaginal cuff.  The biopsy showed inflammation, but normal tissue.  She has been having discharge that is foul-smelling that started after the biopsies.  No palliating or provoking factors.  Her GYN oncologist told her to follow-up with [name] for further evaluation.  She reports no fevers, chills, nausea.  No difficulty with urination or defecation.  She does have chronic diarrhea and sees GI for this   Review of Systems     Objective:   Physical Exam Vitals reviewed. Exam conducted with a chaperone present.  Constitutional:      Appearance: Normal appearance.  Cardiovascular:     Rate and Rhythm: Normal rate and regular rhythm.     Pulses: Normal pulses.     Heart sounds: Normal heart sounds.  Genitourinary:    Comments: Atrophic vaginal mucosa.  There is an apical vaginal prolapse.  This does not extend to the introitus with Valsalva.  Additionally, she does have an area of the vaginal cuff that is denuded and tender. Neurological:     General: No focal deficit present.     Mental Status: She is alert.  Psychiatric:        Mood and Affect: Mood normal.        Behavior: Behavior normal.        Thought Content: Thought content normal.       Assessment & Plan:  1. Vaginal cuff cellulitis Start Cipro and Flagyl for 10 days.  We will have patient return for reevaluation to ensure complete healing.  Vaginal cultures obtained.  Discussed warning signs of worsening infection and told patient to go to the hospital for evaluation if she develops  fevers, chills, nausea and vomiting or worsening abdominal pain  2. Vaginal discharge - Cervicovaginal ancillary only( [place])  3. Vaginal prolapse Apical prolapse.  We will send patient to urogynecology for further evaluation - Ambulatory referral to Urogynecology

## 2021-11-10 LAB — CERVICOVAGINAL ANCILLARY ONLY
Bacterial Vaginitis (gardnerella): NEGATIVE
Candida Glabrata: NEGATIVE
Candida Vaginitis: NEGATIVE
Chlamydia: NEGATIVE
Comment: NEGATIVE
Comment: NEGATIVE
Comment: NEGATIVE
Comment: NEGATIVE
Comment: NEGATIVE
Comment: NORMAL
Neisseria Gonorrhea: NEGATIVE
Trichomonas: NEGATIVE

## 2021-11-14 ENCOUNTER — Encounter: Payer: Self-pay | Admitting: *Deleted

## 2021-11-15 ENCOUNTER — Emergency Department (HOSPITAL_COMMUNITY)
Admission: EM | Admit: 2021-11-15 | Discharge: 2021-11-15 | Disposition: A | Discharge status: Home / Self Care | Payer: Medicare Other | Attending: Emergency Medicine | Admitting: Emergency Medicine

## 2021-11-15 ENCOUNTER — Other Ambulatory Visit: Payer: Self-pay

## 2021-11-15 ENCOUNTER — Telehealth: Payer: Self-pay | Admitting: Family Medicine

## 2021-11-15 DIAGNOSIS — I129 Hypertensive chronic kidney disease with stage 1 through stage 4 chronic kidney disease, or unspecified chronic kidney disease: Secondary | ICD-10-CM | POA: Diagnosis not present

## 2021-11-15 DIAGNOSIS — N760 Acute vaginitis: Secondary | ICD-10-CM | POA: Insufficient documentation

## 2021-11-15 DIAGNOSIS — N189 Chronic kidney disease, unspecified: Secondary | ICD-10-CM | POA: Insufficient documentation

## 2021-11-15 DIAGNOSIS — N898 Other specified noninflammatory disorders of vagina: Secondary | ICD-10-CM

## 2021-11-15 DIAGNOSIS — J45909 Unspecified asthma, uncomplicated: Secondary | ICD-10-CM | POA: Insufficient documentation

## 2021-11-15 DIAGNOSIS — Z8541 Personal history of malignant neoplasm of cervix uteri: Secondary | ICD-10-CM | POA: Diagnosis not present

## 2021-11-15 DIAGNOSIS — I2510 Atherosclerotic heart disease of native coronary artery without angina pectoris: Secondary | ICD-10-CM | POA: Insufficient documentation

## 2021-11-15 DIAGNOSIS — Z79899 Other long term (current) drug therapy: Secondary | ICD-10-CM | POA: Diagnosis not present

## 2021-11-15 DIAGNOSIS — E039 Hypothyroidism, unspecified: Secondary | ICD-10-CM | POA: Diagnosis not present

## 2021-11-15 DIAGNOSIS — N952 Postmenopausal atrophic vaginitis: Secondary | ICD-10-CM | POA: Diagnosis not present

## 2021-11-15 DIAGNOSIS — Z20822 Contact with and (suspected) exposure to covid-19: Secondary | ICD-10-CM | POA: Diagnosis not present

## 2021-11-15 DIAGNOSIS — E1122 Type 2 diabetes mellitus with diabetic chronic kidney disease: Secondary | ICD-10-CM | POA: Insufficient documentation

## 2021-11-15 LAB — CBC
HCT: 39.2 % (ref 36.0–46.0)
Hemoglobin: 12.8 g/dL (ref 12.0–15.0)
MCH: 29.7 pg (ref 26.0–34.0)
MCHC: 32.7 g/dL (ref 30.0–36.0)
MCV: 91 fL (ref 80.0–100.0)
Platelets: 205 10*3/uL (ref 150–400)
RBC: 4.31 MIL/uL (ref 3.87–5.11)
RDW: 14.2 % (ref 11.5–15.5)
WBC: 4.3 10*3/uL (ref 4.0–10.5)
nRBC: 0 % (ref 0.0–0.2)

## 2021-11-15 LAB — COMPREHENSIVE METABOLIC PANEL
ALT: 23 U/L (ref 0–44)
AST: 32 U/L (ref 15–41)
Albumin: 3.8 g/dL (ref 3.5–5.0)
Alkaline Phosphatase: 58 U/L (ref 38–126)
Anion gap: 9 (ref 5–15)
BUN: 22 mg/dL (ref 8–23)
CO2: 27 mmol/L (ref 22–32)
Calcium: 9.5 mg/dL (ref 8.9–10.3)
Chloride: 103 mmol/L (ref 98–111)
Creatinine, Ser: 1.43 mg/dL — ABNORMAL HIGH (ref 0.44–1.00)
GFR, Estimated: 39 mL/min — ABNORMAL LOW (ref >60)
Glucose, Bld: 184 mg/dL — ABNORMAL HIGH (ref 70–99)
Potassium: 3.9 mmol/L (ref 3.5–5.1)
Sodium: 139 mmol/L (ref 135–145)
Total Bilirubin: 0.6 mg/dL (ref 0.3–1.2)
Total Protein: 7 g/dL (ref 6.5–8.1)

## 2021-11-15 LAB — RESP PANEL BY RT-PCR (FLU A&B, COVID) ARPGX2
Influenza A by PCR: NEGATIVE
Influenza B by PCR: NEGATIVE
SARS Coronavirus 2 by RT PCR: NEGATIVE

## 2021-11-15 LAB — LIPASE, BLOOD: Lipase: 33 U/L (ref 11–51)

## 2021-11-15 MED ORDER — METRONIDAZOLE 500 MG PO TABS: 500.0000 mg | ORAL_TABLET | Freq: Two times a day (BID) | ORAL | 0 refills | Status: DC

## 2021-11-15 MED ORDER — CIPROFLOXACIN HCL 500 MG PO TABS: 500.0000 mg | ORAL_TABLET | Freq: Two times a day (BID) | ORAL | 0 refills | Status: DC

## 2021-11-15 NOTE — ED Provider Notes (Signed)
[facility] EMERGENCY DEPARTMENT Provider Note   CSN: [id number] Arrival date & time: 11/15/21  1157     History Chief Complaint  Patient presents with   Abdominal Pain    [name] is a 70 y.o. female.  71 year old female with prior medical history as detailed below presents for evaluation.  Patient reports history of prior hysterectomy.  Patient reports vaginal discharge and discomfort starting approximately 2 weeks ago.  Patient was seen by GYN 7 days prior.  She was diagnosed with a possible vaginal cuff cellulitis.  She was put on Cipro and Flagyl.  She presents today complaining of continued symptoms.  She complains of pelvic fullness and discomfort.  She complains of vaginal discharge.  She denies fever.  She denies abdominal pain.  The history is provided by the patient.  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 name of MD    Diabetes mellitus    type 2    Dyspnea    with exertion    Dysrhythmia    "skips beats"   Family history of adverse reaction to anesthesia    son stopped breathing during surgery , mother slow to wake up    Fibromyalgia 1980's   Generalized anxiety disorder    GERD (gastroesophageal reflux disease)    occ- will use Tums and Prolosec  prn    Heart murmur    Hyperlipemia    Hypertension    not on blood pressure meds due to allergies - last dose of meds- months ago    Hypothyroidism    Irritable bowel  syndrome 1980's   Major depressive disorder    Migraine headaches 1980's   On meds-well controlled   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   Morbid obesity ([place])    Pneumonia 2009   Several times over the past several years   PONV (postoperative nausea and vomiting)    was told by neurologist due to calcificaton in brain not to be put to sleep, N/V during Op and Recovery    Recurrent upper respiratory infection (URI)    Sleep apnea    no cpap   Subarachnoid hemorrhage ([place]) 2010   Vaginal polyp    benign per pt    Patient Active Problem List   Diagnosis Date Noted   Anxiety and depression 10/30/2020   Carotid artery stenosis 10/30/2020   Facial droop 10/29/2020   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   HLD (hyperlipidemia) 10/29/2020   Endometrial cancer ([place]) 10/18/2020   CAD (coronary artery disease) 05/11/2020   Acute encephalopathy 02/26/2020   Hypothyroidism    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   Essential tremor 06/24/2018   Migraine without aura 06/24/2018   Stress at home 08/28/2015   Post-menopausal bleeding 06/16/2011   Kidney disease 06/16/2011   Colon polyp 06/16/2011   Vaginal polyp 06/16/2011   Chronic diarrhea 06/16/2011   Diverticulitis  06/16/2011   DUB (dysfunctional uterine bleeding) 06/16/2011   Fatty liver 06/16/2011   Diabetes mellitus type II, non insulin dependent ([place]) 06/16/2011    Past Surgical History:  Procedure Laterality Date   CHOLECYSTECTOMY  1983   COLONOSCOPY  2008   DB   DILATION AND CURETTAGE OF UTERUS  1972   HYSTEROSCOPY WITH D & C  06/29/2011   Procedure: DILATATION AND CURETTAGE (D&C) /HYSTEROSCOPY;  Surgeon: Donnamae Jude, MD;  Location: [facility];  Service: Gynecology;  Laterality: N/A;   ROBOTIC ASSISTED TOTAL HYSTERECTOMY WITH BILATERAL SALPINGO OOPHERECTOMY N/A 10/27/2020   Procedure: XI ROBOTIC ASSISTED TOTAL HYSTERECTOMY WITH BILATERAL SALPINGO OOPHORECTOMY;  Surgeon: Everitt Amber, MD;  Location: [facility];  Service:  Gynecology;  Laterality: N/A;   SENTINEL NODE BIOPSY N/A 10/27/2020   Procedure: SENTINEL NODE BIOPSY;  Surgeon: Everitt Amber, MD;  Location: [facility];  Service: Gynecology;  Laterality: N/A;     OB History     Gravida  5   Para  3   Term  3   Preterm  0   AB  2   Living  3      SAB  2   IAB      Ectopic      Multiple      Live Births              Family History  Problem Relation Age of Onset   Liver cancer Father        mets to liver    Bladder Cancer Father    Colon cancer Father        mets to colon    Anesthesia problems Mother        hard to wake post op    Cerebrovascular Disease Mother        Never had stroke, but had CEA.   High blood pressure Other    Diabetes Other    Arthritis Other    Anesthesia problems Son        stopped breathing post op    Tremor Son    Schizophrenia Daughter        Schizoaffective disorder   Anesthesia problems Granddaughter        PONV   Dementia Neg Hx    Esophageal cancer Neg Hx    Rectal cancer Neg Hx    Stomach cancer Neg Hx    Colon polyps Neg Hx     Social History   Tobacco Use   Smoking status: Never   Smokeless tobacco: Never  Vaping Use   Vaping Use: Never used  Substance Use Topics   Alcohol use: No    Comment: quit 1978   Drug use: No    Home Medications Prior to Admission medications   Medication Sig Start Date End Date Taking? Authorizing Provider  acetaminophen (TYLENOL) 500 MG tablet Take 500 mg by mouth every 6 (six) hours as needed for moderate pain.    [provider]  alum & mag hydroxide-simeth (MAALOX MAX) 400-400-40 MG/5ML suspension Take 10 mLs by mouth every 6 (six) hours as needed for indigestion. 01/06/21   Long, Wonda Olds, MD  amitriptyline (ELAVIL) 10 MG tablet Take 10 mg by mouth at bedtime.  08/24/20   [provider]  bumetanide (BUMEX) 1 MG tablet Take 1 mg by mouth every morning. 02/05/20   [provider]  ciprofloxacin (CIPRO) 500 MG tablet Take  1 tablet (500 mg total) by mouth 2 (two) times  daily for 10 days. 11/08/21 11/18/21  Truett Mainland, DO  cyanocobalamin (,VITAMIN B-12,) 1000 MCG/ML injection Inject 1,000 mcg into the muscle every 30 (thirty) days.     [provider]  dicyclomine (BENTYL) 10 MG capsule Take 1 capsule (10 mg total) by mouth every 8 (eight) hours as needed for spasms. 02/10/21   Mauri Pole, MD  diphenoxylate-atropine (LOMOTIL) 2.5-0.025 MG tablet TAKE 1 TABLET BY MOUTH TWICE DAILY AS NEEDED FOR  DIARRHEA  OR  LOOSE  STOOLS 07/17/21   Nandigam, Venia Minks, MD  Evolocumab (REPATHA SURECLICK) 096 MG/ML SOAJ INJECT 1 PEN SUBCUTANEOUSLY EVERY 14 DAYS 11/01/21   Jettie Booze, MD  levothyroxine (SYNTHROID, LEVOTHROID) 50 MCG tablet Take 50 mcg by mouth daily.    [provider]  metroNIDAZOLE (FLAGYL) 500 MG tablet Take 1 tablet (500 mg total) by mouth 2 (two) times daily for 10 days. 11/08/21 11/18/21  Truett Mainland, DO  pantoprazole (PROTONIX) 40 MG tablet Take 1 tablet (40 mg total) by mouth daily. 06/10/20   Mauri Pole, MD  Potassium 99 MG TABS Take 198 mg by mouth daily.    [provider]    Allergies    Acarbose, Atenolol, Cinnamon, Glimepiride, Invokana [canagliflozin], Januvia [sitagliptin], Losartan potassium, Metformin and related, Other, Pioglitazone, Statins, Sulfamethoxazole, Tramadol hcl, and Sulfa antibiotics  Review of Systems   Review of Systems  All other systems reviewed and are negative.  Physical Exam Updated Vital Signs BP (!) 161/107 (BP Location: Left Arm)   Pulse 62   Temp 98.9 F (37.2 C) (Oral)   Resp 15   SpO2 100%   Physical Exam Vitals and nursing note reviewed.  Constitutional:      General: She is not in acute distress.    Appearance: Normal appearance. She is well-developed.  HENT:     Head: Normocephalic and atraumatic.  Eyes:     Conjunctiva/sclera: Conjunctivae normal.     Pupils: Pupils are equal, round, and reactive  to light.  Cardiovascular:     Rate and Rhythm: Normal rate and regular rhythm.     Heart sounds: Normal heart sounds.  Pulmonary:     Effort: Pulmonary effort is normal. No respiratory distress.     Breath sounds: Normal breath sounds.  Abdominal:     General: There is no distension.     Palpations: Abdomen is soft.     Tenderness: There is no abdominal tenderness.  Genitourinary:    Vagina: Normal.     Comments: Vaginal cuff visualized.  There is an area of mild erythema and mucosal denuding.  Chaperone present during vaginal exam. Musculoskeletal:        General: No deformity. Normal range of motion.     Cervical back: Normal range of motion and neck supple.  Skin:    General: Skin is warm and dry.  Neurological:     General: No focal deficit present.     Mental Status: She is alert and oriented to person, place, and time.    ED Results / Procedures / Treatments   Labs (all labs ordered are listed, but only abnormal results are displayed) Labs Reviewed  COMPREHENSIVE METABOLIC PANEL - Abnormal; Notable for the following components:      Result Value   Glucose, Bld 184 (*)    Creatinine, Ser 1.43 (*)    GFR, Estimated 39 (*)    All other components within normal limits  RESP PANEL BY RT-PCR (FLU A&B, COVID) ARPGX2  LIPASE, BLOOD  CBC  URINALYSIS, ROUTINE W REFLEX MICROSCOPIC    EKG None  Radiology No results found.  Procedures Procedures   Medications Ordered in ED Medications - No data to display  ED Course  I have reviewed the triage vital signs and the nursing notes.  Pertinent labs & imaging results that were available during my care of the patient were reviewed by me and considered in my medical decision making (see chart for details).    MDM Rules/Calculators/A&P                           MDM  MSE complete  [name] was evaluated in Emergency Department on 11/15/2021 for the symptoms described in the history of present illness. She  was evaluated in the context of the global COVID-19 pandemic, which necessitated consideration that the patient might be at risk for infection with the SARS-CoV-2 virus that causes COVID-19. Institutional protocols and algorithms that pertain to the evaluation of patients at risk for COVID-19 are in a state of rapid change based on information released by regulatory bodies including the CDC and federal and state organizations. These policies and algorithms were followed during the patient's care in the ED.  Patient is presenting with complaint of vaginal discharge.  Patient recently diagnosed with vaginal cuff cellulitis.  Patient has been on outpatient antibiotics without improvement in symptoms for the last 7 days.  Case discussed with Dr. Roselie Awkward covering GYN.  He will evaluate the patient in person.  Seen by Dr. Roselie Awkward. Appropriate for discharge.   Final Clinical Impression(s) / ED Diagnoses Final diagnoses:  Acute vaginitis    Rx / DC Orders ED Discharge Orders     None        Valarie Merino, MD 11/15/21 2008

## 2021-11-15 NOTE — ED Provider Notes (Signed)
Emergency Medicine Provider Triage Evaluation Note  [name] , a 70 y.o. female  was evaluated in triage.  Pt complains of abd discomfort and vaginal pain. Denies fevers. Has vaginal prolapse and has been on abx for 10 days. Sxs not improved. She is now having pain to the lower abdomen with nausea  Review of Systems  Positive: Abd pain, nausea, vaginal pain Negative: fever  Physical Exam  BP (!) 154/101   Pulse 89   Temp 97.8 F (36.6 C) (Oral)   Resp 16   SpO2 100%  Gen:   Awake, no distress   Resp:  Normal effort  MSK:   Moves extremities without difficulty  Other:  Abd soft  Medical Decision Making  Medically screening exam initiated at 2:20 PM.  Appropriate orders placed.  [name] was informed that the remainder of the evaluation will be completed by another provider, this initial triage assessment does not replace that evaluation, and the importance of remaining in the ED until their evaluation is complete.     Rodney Booze, PA-C 11/15/21 1425    Elnora Morrison, MD 11/16/21 1334

## 2021-11-15 NOTE — Discharge Instructions (Signed)
Return for any problem.   Finish course of antibiotics as previously prescribed.

## 2021-11-15 NOTE — ED Triage Notes (Signed)
BIB POV after pt was told to go to the ED by OB/GYN for an infected vaginal prolapse. Per pt, they were on abx x 10 days PO for infection but symptoms unresolved. Pt denies chills/fever/n/v.  PMH: Cancer, vaginal prolapse, total hysterectomy

## 2021-11-15 NOTE — Consult Note (Signed)
Reason for Consult:Vaginal discharge  Referring Physician: Dr.Messick  [name] is an 70 y.o. female. [id number] She had [place] done last year by Dr. Denman George for endometrial cancer, followed for possible vaginal prolapse and s/p ABX for 10 days for vaginal discharge, vaginitis possible cellulitis. Sx have not improved she says.   Pertinent Gynecological History: Menses: post-menopausal Bleeding: no Previous GYN Procedures:  hysterectcomy    Last pap: normal Date: 2016    Menstrual History:  No LMP recorded. Patient is postmenopausal.    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 name of MD    Diabetes mellitus    type 2    Dyspnea    with exertion    Dysrhythmia    "skips beats"   Family history of adverse reaction to anesthesia    son stopped breathing during surgery , mother slow to wake up    Fibromyalgia 1980's   Generalized anxiety disorder    GERD (gastroesophageal reflux disease)    occ- will use Tums and Prolosec  prn    Heart murmur    Hyperlipemia    Hypertension    not on blood pressure meds due to allergies - last dose of meds- months ago    Hypothyroidism    Irritable bowel syndrome 1980's   Major depressive disorder    Migraine headaches 1980's   On meds-well controlled   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   Morbid obesity ([place])    Pneumonia 2009   Several times over the past several years   PONV (postoperative nausea and vomiting)    was told by neurologist due to calcificaton in brain not to be put to sleep, N/V during Op and Recovery    Recurrent upper respiratory infection (URI)    Sleep apnea    no cpap   Subarachnoid hemorrhage  ([place]) 2010   Vaginal polyp    benign per pt    Past Surgical History:  Procedure Laterality Date   CHOLECYSTECTOMY  1983   COLONOSCOPY  2008   DB   DILATION AND CURETTAGE OF UTERUS  1972   HYSTEROSCOPY WITH D & C  06/29/2011   Procedure: DILATATION AND CURETTAGE (D&C) /HYSTEROSCOPY;  Surgeon: Donnamae Jude, MD;  Location: [facility];  Service: Gynecology;  Laterality: N/A;   ROBOTIC ASSISTED TOTAL HYSTERECTOMY WITH BILATERAL SALPINGO OOPHERECTOMY N/A 10/27/2020   Procedure: XI ROBOTIC ASSISTED TOTAL HYSTERECTOMY WITH BILATERAL SALPINGO OOPHORECTOMY;  Surgeon: Everitt Amber, MD;  Location: [facility];  Service: Gynecology;  Laterality: N/A;   SENTINEL NODE BIOPSY N/A 10/27/2020   Procedure: SENTINEL NODE BIOPSY;  Surgeon: Everitt Amber, MD;  [facility];  Service: Gynecology;  Laterality: N/A;    Family History  Problem Relation Age of Onset   Liver cancer Father        mets to liver    Bladder Cancer Father    Colon cancer Father        mets to colon    Anesthesia problems Mother        hard to wake post op  Cerebrovascular Disease Mother        Never had stroke, but had CEA.   High blood pressure Other    Diabetes Other    Arthritis Other    Anesthesia problems Son        stopped breathing post op    Tremor Son    Schizophrenia Daughter        Schizoaffective disorder   Anesthesia problems Granddaughter        PONV   Dementia Neg Hx    Esophageal cancer Neg Hx    Rectal cancer Neg Hx    Stomach cancer Neg Hx    Colon polyps Neg Hx     Social History:  reports that she has never smoked. She has never used smokeless tobacco. She reports that she does not drink alcohol and does not use drugs.  Allergies:  Allergies  Allergen Reactions   Acarbose    Atenolol     Stomach problems, chills   Cinnamon     Stomach pain   Glimepiride     ALL DIABETIC MEDS PER PT    Invokana [Canagliflozin]     ALL DIABETIC MEDS PER PT    Januvia [Sitagliptin]     ALL DIABETIC MEDS PER  PT    Losartan Potassium    Metformin And Related Nausea And Vomiting and Other (See Comments)    Patient states that she has severe chills, headache and cramping additionally. Says blood sugar is uncontrolled because of this   Other Other (See Comments)    ALL DIABETIC MEDS PER PT    Pioglitazone Other (See Comments)    ALL DIABETIC MEDS PER PT    Statins     Myalgia   Sulfamethoxazole Other (See Comments)    ALL DIABETIC MEDS PER PT    Tramadol Hcl Itching   Sulfa Antibiotics Rash and Other (See Comments)    Mother has told patient in the past not to take, she cannot recall there reaction    Medications: I have reviewed the patient's current medications.  Review of Systems  Respiratory: Negative.    Gastrointestinal:  Positive for constipation. Negative for abdominal distention, diarrhea and nausea.  Genitourinary:  Positive for vaginal discharge. Negative for difficulty urinating, dysuria, pelvic pain and vaginal bleeding.  Neurological:  Positive for dizziness.   Blood pressure 139/86, pulse (!) 44, temperature 98.9 F (37.2 C), temperature source Oral, resp. rate (!) 23, SpO2 100 %. Physical Exam Vitals and nursing note reviewed. Exam conducted with a chaperone present.  Constitutional:      Appearance: She is not ill-appearing.  Pulmonary:     Effort: Pulmonary effort is normal.  Genitourinary:    Vagina: No foreign body. No vaginal discharge or bleeding.     Comments: No significant descensus, atrophic cuff not tender no mass Skin:    General: Skin is warm and dry.  Neurological:     Mental Status: She is alert.    Results for orders placed or performed during the hospital encounter of 11/15/21 (from the past 48 hour(s))  Lipase, blood     Status: None   Collection Time: 11/15/21  2:14 PM  Result Value Ref Range   Lipase 33 11 - 51 U/L    Comment: Performed at [facility], [place] [address] [place], [place] [address]  Comprehensive metabolic panel      Status: Abnormal   Collection Time: 11/15/21  2:14 PM  Result Value Ref Range   Sodium  139 135 - 145 mmol/L   Potassium 3.9 3.5 - 5.1 mmol/L   Chloride 103 98 - 111 mmol/L   CO2 27 22 - 32 mmol/L   Glucose, Bld 184 (H) 70 - 99 mg/dL    Comment: Glucose reference range applies only to samples taken after fasting for at least 8 hours.   BUN 22 8 - 23 mg/dL   Creatinine, Ser 1.43 (H) 0.44 - 1.00 mg/dL   Calcium 9.5 8.9 - 10.3 mg/dL   Total Protein 7.0 6.5 - 8.1 g/dL   Albumin 3.8 3.5 - 5.0 g/dL   AST 32 15 - 41 U/L   ALT 23 0 - 44 U/L   Alkaline Phosphatase 58 38 - 126 U/L   Total Bilirubin 0.6 0.3 - 1.2 mg/dL   GFR, Estimated 39 (L) >60 mL/min    Comment: (NOTE) Calculated using the CKD-EPI Creatinine Equation (2021)    Anion gap 9 5 - 15    Comment: Performed at [facility] [address], [place], [place] [address]  CBC     Status: None   Collection Time: 11/15/21  2:14 PM  Result Value Ref Range   WBC 4.3 4.0 - 10.5 K/uL   RBC 4.31 3.87 - 5.11 MIL/uL   Hemoglobin 12.8 12.0 - 15.0 g/dL   HCT 39.2 36.0 - 46.0 %   MCV 91.0 80.0 - 100.0 fL   MCH 29.7 26.0 - 34.0 pg   MCHC 32.7 30.0 - 36.0 g/dL   RDW 14.2 11.5 - 15.5 %   Platelets 205 150 - 400 K/uL   nRBC 0.0 0.0 - 0.2 %    Comment: Performed at [facility], [place] [address], [place], [place] [address]    No results found.  Assessment/Plan: Possible atrophic vaginitis but can finish her antibiotic and keep appointment in 9 days [facility] 11/15/2021

## 2021-11-15 NOTE — Telephone Encounter (Signed)
Patient called stating she had an appointment on 11/23 and was prescribed medication for a vaginal infection, she was told at the appointment if the medication was not working that the option of hospitalization was the next step. She said the medication is not working and she is now experiencing stomach pain and discomfort along with a headache. She is not sure what to do from here. Patient said if she does not answer the phone call it is okay to leave her a voice mail.

## 2021-11-16 NOTE — Telephone Encounter (Signed)
Reviewed last provider note that states patient should go to ED with any worsening symptoms for further evaluation. Recommendation communicated to patient by front office staff [place]. Pt agreeable to go to ED for eval.

## 2021-11-17 ENCOUNTER — Telehealth: Payer: Self-pay

## 2021-11-17 NOTE — Telephone Encounter (Signed)
Noted, Patient's choice, thanks.

## 2021-11-17 NOTE — Telephone Encounter (Signed)
Received a call from [name] at [facility], they wanted to inform [name] that patient would like to delay care 2 weeks.

## 2021-11-19 ENCOUNTER — Other Ambulatory Visit: Payer: Self-pay | Admitting: Family Medicine

## 2021-11-22 ENCOUNTER — Ambulatory Visit: Payer: Medicare Other | Admitting: Obstetrics & Gynecology

## 2021-11-24 ENCOUNTER — Ambulatory Visit (INDEPENDENT_AMBULATORY_CARE_PROVIDER_SITE_OTHER): Payer: Medicare Other | Admitting: Obstetrics and Gynecology

## 2021-11-24 ENCOUNTER — Encounter: Payer: Self-pay | Admitting: Obstetrics and Gynecology

## 2021-11-24 ENCOUNTER — Other Ambulatory Visit: Payer: Self-pay

## 2021-11-24 ENCOUNTER — Other Ambulatory Visit: Payer: Self-pay | Admitting: Obstetrics and Gynecology

## 2021-11-24 VITALS — BP 156/86 | HR 108 | Wt 178.7 lb

## 2021-11-24 DIAGNOSIS — R42 Dizziness and giddiness: Secondary | ICD-10-CM

## 2021-11-24 DIAGNOSIS — R1030 Lower abdominal pain, unspecified: Secondary | ICD-10-CM | POA: Diagnosis not present

## 2021-11-24 DIAGNOSIS — R319 Hematuria, unspecified: Secondary | ICD-10-CM

## 2021-11-24 DIAGNOSIS — N898 Other specified noninflammatory disorders of vagina: Secondary | ICD-10-CM | POA: Diagnosis not present

## 2021-11-24 MED ORDER — NYSTATIN-TRIAMCINOLONE 100000-0.1 UNIT/GM-% EX OINT: 1.0000 "application " | TOPICAL_OINTMENT | Freq: Every day | CUTANEOUS | 0 refills | Status: DC

## 2021-11-25 LAB — URINALYSIS, ROUTINE W REFLEX MICROSCOPIC
Bilirubin, UA: NEGATIVE
Ketones, UA: NEGATIVE
Leukocytes,UA: NEGATIVE
Nitrite, UA: NEGATIVE
Specific Gravity, UA: 1.025 (ref 1.005–1.030)
Urobilinogen, Ur: 0.2 mg/dL (ref 0.2–1.0)
pH, UA: 6 (ref 5.0–7.5)

## 2021-11-25 LAB — MICROSCOPIC EXAMINATION
Bacteria, UA: NONE SEEN
Casts: NONE SEEN /lpf
RBC, Urine: NONE SEEN /hpf (ref 0–2)

## 2021-11-26 DIAGNOSIS — N898 Other specified noninflammatory disorders of vagina: Secondary | ICD-10-CM | POA: Insufficient documentation

## 2021-11-26 NOTE — Progress Notes (Signed)
Obstetrics and Gynecology Visit Return Patient Evaluation  Appointment Date: 11/24/2021  Primary Care Provider: Allwardt, Randa Evens  OBGYN Clinic: [facility]  Chief Complaint: ED follow up  History of Present Illness:  [name] is a 70 y.o. with above CC. Patient had an 10/27/2020 RA-TLH/BSO/sentinel LND by Dr. Denman George of Gyn Onc for what turned out to be stage 1a, grade 1 endometrial cancer with adjuvant therapy not indicated.  GYN-wise, she was having no issues and was seen by [facility] for suveillance visits on 11/02/20, 11/17/20, and on 04/17/21 and was doing well w/o any issues with negative physical exams with plan for q75m exams for endometrial cancer surveillance.  Patient seen on 09/12/21 by Gyn Onc for VB. Exam showed, "Genito-Urinary: No visible lesions on the vulva. Grade 3 prolapse to the introitus on exam. No nodularity with smooth mucosa. Erythema at the cuff corresponding to prolapsed area. With patient permission, a biopsy was taken from the erythema at the cuff. Hemostasis achieved with silver nitrate application. No palpable masses on bimanual. Dr. Denman George to the room to exam the patient as well given the significant changes in the vagina from her previous exam. " Biopsy came back negative for malignancy and only showed "reactive squamous mucosa with acute inflammation" and plan for q40m exams.   Patient seen on 11/08/21 by Dr. Nehemiah Settle of the Center for [place] for discharge since the biopsy. Exam showed, "Atrophic vaginal mucosa.  There is an apical vaginal prolapse.  This does not extend to the introitus with Valsalva.  Additionally, she does have an area of the vaginal cuff that is denuded and tender", and cipro and flagyl prescribed for presumed cuff cellulitis. Patient was seen in the ED on 11/30 by Dr Roselie Awkward of [place] [facility] [place] for vaginal discharge, and his exam showed, "No significant descensus, atrophic cuff not  tender no mass" and plan for her to finish her abx and f/u her already scheduled appointment here  Interval History: Patient states there has been no change in her vaginal discharge even after finishing her initial cipro-flagyl abx course. She states that in the ED she was supposed to get another course of abx but it was never at the pharmacy. She states her discharge is pink and not bleeding.   Patient also notes continued dizziness with walking and states she has multiple episodes a day when she walks but she states she has no issues with driving.   Review of Systems: as noted in the History of Present Illness.  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   HLD (hyperlipidemia) 10/29/2020   Endometrial cancer ([place]) 10/18/2020   CAD (coronary artery disease) 05/11/2020   Acute encephalopathy 02/26/2020   Hypothyroidism    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   Essential tremor 06/24/2018   Migraine without aura 06/24/2018   Stress at home 08/28/2015   Post-menopausal bleeding 06/16/2011   Kidney disease 06/16/2011   Colon polyp 06/16/2011   Vaginal polyp 06/16/2011   Chronic diarrhea 06/16/2011   Diverticulitis 06/16/2011   DUB (dysfunctional uterine bleeding) 06/16/2011   Fatty liver 06/16/2011   Diabetes mellitus type II, non insulin dependent ([place]) 06/16/2011   Medications:  Johnanna H. Parady had no medications administered during this visit. Current Outpatient Medications  Medication Sig Dispense Refill   acetaminophen (TYLENOL) 500  MG tablet Take 500 mg by mouth every 6 (six) hours as needed for moderate pain.     alum & mag hydroxide-simeth (MAALOX MAX) 400-400-40 MG/5ML suspension Take 10 mLs by mouth every 6 (six) hours as needed for indigestion. 355 mL 0   amitriptyline (ELAVIL) 10 MG tablet Take 10 mg by mouth at bedtime.       bumetanide (BUMEX) 1 MG tablet Take 1 mg by mouth every morning.     ciprofloxacin (CIPRO) 500 MG tablet Take 1 tablet by mouth twice daily for 10 days 20 tablet 0   cyanocobalamin (,VITAMIN B-12,) 1000 MCG/ML injection Inject 1,000 mcg into the muscle every 30 (thirty) days.      dicyclomine (BENTYL) 10 MG capsule Take 1 capsule (10 mg total) by mouth every 8 (eight) hours as needed for spasms. 90 capsule 11   diphenoxylate-atropine (LOMOTIL) 2.5-0.025 MG tablet TAKE 1 TABLET BY MOUTH TWICE DAILY AS NEEDED FOR  DIARRHEA  OR  LOOSE  STOOLS 60 tablet 0   Evolocumab (REPATHA SURECLICK) 536 MG/ML SOAJ INJECT 1 PEN SUBCUTANEOUSLY EVERY 14 DAYS 2 mL 11   levothyroxine (SYNTHROID, LEVOTHROID) 50 MCG tablet Take 50 mcg by mouth daily.     pantoprazole (PROTONIX) 40 MG tablet Take 1 tablet (40 mg total) by mouth daily. 30 tablet 11   Potassium 99 MG TABS Take 198 mg by mouth daily.     No current facility-administered medications for this visit.    Allergies: is allergic to acarbose, atenolol, cinnamon, glimepiride, invokana [canagliflozin], januvia [sitagliptin], losartan potassium, metformin and related, other, pioglitazone, statins, sulfamethoxazole, tramadol hcl, and sulfa antibiotics.  Physical Exam:  BP (!) 156/86   Pulse (!) 108   Wt 178 lb 11.2 oz (81.1 kg)   BMI 32.68 kg/m  Body mass index is 32.68 kg/m. General appearance: Well nourished, well developed female in no acute distress.  Abdomen: diffusely non tender to palpation, non distended, and no masses, hernias Neuro/Psych:  Normal mood and affect.    Pelvic exam:  EGBUS: mild to moderate atrophy with irritation (pt states due to wearing a pad all the time) Vaginal vault: scant pink discharge. Mild to moderate atrophy Cuff: erythematous area at the cuff (nttp, not actively bleeding) that is linear and horizontal and approximately 3cm in length.  Bimanual: benign   Labs: u dip with hematuria  Assessment: pt  stable  Plan:  1. Lower abdominal pain - Urine Culture - Urinalysis, Routine w reflex microscopic  2. Dizziness It appears she has been referred to neurology but neurology with [facility] declined her referral and recommended she follow up with her prior neurologist at [facility]; patient unaware of this. I told her that I recommend she call her PCP today and/or go to the ED today due to her s/s and falling and risk of trauma with a fall  3. Hematuria, unspecified type - Urinalysis, Routine w reflex microscopic  4. Vaginal discharge I asked her why she followed up with [name] on 11/23 instead of with Gyn Onc and the patient states that [facility] told her to follow up with benign GYN and not them. I told her that I'm not sure of the etiology her discharge but they need to continue to see her due to her being so close out from her surgery and surgery alone is not 100% curative for her early stage endometrial cancer.  I told her that I will try mycolog to see if that can help and that I recommend  her to do it externally and internally qhs until seen by a GYN provider. I will make her a follow up visit here in 3-4 weeks so she has an appt set up our clinic RNs are to call Gyn Onc to have them continue to follow up with her   RTC: 3-4 weeks  Durene Romans MD Attending Center for [facility])

## 2021-11-27 ENCOUNTER — Telehealth: Payer: Self-pay

## 2021-11-27 ENCOUNTER — Other Ambulatory Visit: Payer: Self-pay | Admitting: Gynecologic Oncology

## 2021-11-27 DIAGNOSIS — M25559 Pain in unspecified hip: Secondary | ICD-10-CM

## 2021-11-27 DIAGNOSIS — R1030 Lower abdominal pain, unspecified: Secondary | ICD-10-CM

## 2021-11-27 NOTE — Progress Notes (Signed)
CT scan of the abdomen and pelvis ordered per Dr. Berline Lopes to evaluate for signs of recurrence or cause of lower abdomen/pelvic pain. Recent creatinine noted. Per radiology, a reduced IV contrast dose will be given.

## 2021-11-27 NOTE — Telephone Encounter (Signed)
Following up with [name]. Advised patient that a CT scan will be ordered to evaluate her symptoms. Patient denies allergies to oral and IV contrast. Instructed patient that our office will work on scheduling this and be in touch with appointment details. Patient verbalized understanding.

## 2021-11-27 NOTE — Telephone Encounter (Signed)
Noted  

## 2021-11-27 NOTE — Telephone Encounter (Signed)
Received call from Ms. Dornfeld this morning. Patient reports being seen by Dr. Nehemiah Settle 2-3 weeks ago and was found to have a vaginal infection. She was started on 2 antibiotics for 10 days and advised if that did not work that she would need IV antibiotics. Patient went to ER on 11/15/21, she was not given IV antibiotics but was discharged with a prescription for antibiotics but states the prescription was never sent it. Patient followed up with her gyn who thought the infection was getting better but was advised to see gyn oncology for pain.  Patient reports pain starting 7-9 days ago. Pain is in her lower stomach and tailbone. Pain is worse with movement. No cramping, feels like a sharp stabbing. Tylenol did not help but ibuprofen provided some relief. She rates her pain as 6/10 but pain increased last night to 8-9/10. Her pain last night was predominantly on her right lower side and she states the area appears swollen. Occasional nausea, no vomiting. She denies vaginal bleeding. She continues to have a watery vaginal discharge with odor. Discharge is occasionally pink.  She reports BM's have been normal and regular. Last BM was this morning. She denies urinary pain, frequency or urgency. She denies fever but reports chills but states this normal for her.  She reports dizziness, balance issues and falling 3 times the other day. She has not followed up with neurology at [place] patient that MD would be notified and someone from our office will be in touch with her.

## 2021-11-27 NOTE — Telephone Encounter (Signed)
Following up with Ms. Rotundo, [place] scan scheduled for 12/04/21 at 4:30 pm. Patient to arrive by 4:15. Nothing to eat or drink 4 hours prior to exam. Patient to drink 1st bottle of contrast at 2:30 pm and second bottle at 3:30 pm. Patient verbalizes understanding and requests that appointment details be sent to her e-mail [email or web address]. E-mail sent successfully.  Instructed patient that contrast will be left at front desk of the cancer center for her to pick-up this week.  Instructed patient to call with any questions or concerns.

## 2021-11-27 NOTE — Telephone Encounter (Addendum)
Received a call from [place] at [place], patient has declined all home health services and wanted to wait until 2023 to begin [facility].

## 2021-11-28 DIAGNOSIS — D510 Vitamin B12 deficiency anemia due to intrinsic factor deficiency: Secondary | ICD-10-CM | POA: Diagnosis not present

## 2021-11-28 LAB — URINE CULTURE

## 2021-12-04 ENCOUNTER — Other Ambulatory Visit: Payer: Self-pay

## 2021-12-04 ENCOUNTER — Encounter (HOSPITAL_BASED_OUTPATIENT_CLINIC_OR_DEPARTMENT_OTHER): Payer: Self-pay

## 2021-12-04 ENCOUNTER — Ambulatory Visit (HOSPITAL_BASED_OUTPATIENT_CLINIC_OR_DEPARTMENT_OTHER)
Admission: RE | Admit: 2021-12-04 | Discharge: 2021-12-04 | Disposition: A | Discharge status: Home / Self Care | Payer: Medicare Other | Source: Ambulatory Visit | Attending: Gynecologic Oncology | Admitting: Gynecologic Oncology

## 2021-12-04 ENCOUNTER — Telehealth: Payer: Self-pay

## 2021-12-04 DIAGNOSIS — K5730 Diverticulosis of large intestine without perforation or abscess without bleeding: Secondary | ICD-10-CM | POA: Diagnosis not present

## 2021-12-04 DIAGNOSIS — I700 Atherosclerosis of aorta: Secondary | ICD-10-CM | POA: Diagnosis not present

## 2021-12-04 DIAGNOSIS — R1030 Lower abdominal pain, unspecified: Secondary | ICD-10-CM | POA: Insufficient documentation

## 2021-12-04 DIAGNOSIS — N200 Calculus of kidney: Secondary | ICD-10-CM | POA: Diagnosis not present

## 2021-12-04 DIAGNOSIS — M25559 Pain in unspecified hip: Secondary | ICD-10-CM | POA: Diagnosis not present

## 2021-12-04 MED ORDER — IOHEXOL 300 MG/ML  SOLN
75.0000 mL | Freq: Once | INTRAMUSCULAR | Status: AC | PRN
  Administered 2021-12-04: 17:00:00 75 mL via INTRAVENOUS

## 2021-12-04 NOTE — Telephone Encounter (Signed)
Returned [name] call regarding questions about her CT scan today. Informed pt that she should not eat or drink anything 4 hours prior to exam, even water.  Informed pt to drink the first bottle of contrast at 2:30 pm and the second bottle at 3:30 pm. Informed pt to call our office if she had any additional questions. Pt verbalized understanding.

## 2021-12-05 ENCOUNTER — Other Ambulatory Visit: Payer: Self-pay | Admitting: General Practice

## 2021-12-05 DIAGNOSIS — N898 Other specified noninflammatory disorders of vagina: Secondary | ICD-10-CM

## 2021-12-06 ENCOUNTER — Other Ambulatory Visit: Payer: Self-pay | Admitting: Obstetrics and Gynecology

## 2021-12-06 ENCOUNTER — Telehealth: Payer: Self-pay

## 2021-12-06 MED ORDER — NYSTATIN 100000 UNIT/GM EX OINT
1.0000 | TOPICAL_OINTMENT | Freq: Every morning | CUTANEOUS | 0 refills | Status: AC
Start: 2021-12-06 — End: 2022-01-20

## 2021-12-06 MED ORDER — TRIAMCINOLONE ACETONIDE 0.5 % EX OINT: 1.0000 "application " | TOPICAL_OINTMENT | Freq: Every day | CUTANEOUS | 0 refills | Status: DC

## 2021-12-06 NOTE — Telephone Encounter (Signed)
Reviewed CT results with [name]. Per Joylene John, NP "Good news! No evidence of cancer on your CT scan. There are tiny kidney stones in both kidneys but they are not blocking the flow or causing issues. They note that you have diverticulosis which are the outpouching areas on the colon but no signs of inflammation (diverticulitis) in these. They also see an old spinal compression fracture that has not changed." Patient verbalized understanding.  Instructed to call with any needs.

## 2021-12-06 NOTE — Telephone Encounter (Signed)
Attempted to reach patient to review recent CT results. Unable to reach patient. Left message requesting return call.

## 2021-12-19 ENCOUNTER — Encounter: Payer: Self-pay | Admitting: Interventional Cardiology

## 2021-12-19 ENCOUNTER — Ambulatory Visit (INDEPENDENT_AMBULATORY_CARE_PROVIDER_SITE_OTHER): Payer: Medicare Other | Admitting: Interventional Cardiology

## 2021-12-19 ENCOUNTER — Other Ambulatory Visit: Payer: Self-pay

## 2021-12-19 VITALS — BP 120/72 | HR 96 | Ht 62.75 in | Wt 178.0 lb

## 2021-12-19 DIAGNOSIS — I491 Atrial premature depolarization: Secondary | ICD-10-CM | POA: Diagnosis not present

## 2021-12-19 DIAGNOSIS — E1149 Type 2 diabetes mellitus with other diabetic neurological complication: Secondary | ICD-10-CM

## 2021-12-19 DIAGNOSIS — I6521 Occlusion and stenosis of right carotid artery: Secondary | ICD-10-CM

## 2021-12-19 DIAGNOSIS — R931 Abnormal findings on diagnostic imaging of heart and coronary circulation: Secondary | ICD-10-CM | POA: Diagnosis not present

## 2021-12-19 DIAGNOSIS — R42 Dizziness and giddiness: Secondary | ICD-10-CM

## 2021-12-19 DIAGNOSIS — I493 Ventricular premature depolarization: Secondary | ICD-10-CM

## 2021-12-19 NOTE — Patient Instructions (Signed)
Medication Instructions:  Your physician recommends that you continue on your current medications as directed. Please refer to the Current Medication list given to you today. Can change bumex to as needed--Do not have to take every day *If you need a refill on your cardiac medications before your next appointment, please call your pharmacy*   Lab Work: none If you have labs (blood work) drawn today and your tests are completely normal, you will receive your results only by: [place] (if you have MyChart) OR A paper copy in the mail If you have any lab test that is abnormal or we need to change your treatment, we will call you to review the results.   Testing/Procedures: none   Follow-Up: At [facility], you and your health needs are our priority.  As part of our continuing mission to provide you with exceptional heart care, we have created designated Provider Care Teams.  These Care Teams include your primary Cardiologist (physician) and Advanced Practice Providers (APPs -  Physician Assistants and Nurse Practitioners) who all work together to provide you with the care you need, when you need it.  We recommend signing up for the patient portal called "MyChart".  Sign up information is provided on this After Visit Summary.  MyChart is used to connect with patients for Virtual Visits (Telemedicine).  Patients are able to view lab/test results, encounter notes, upcoming appointments, etc.  Non-urgent messages can be sent to your provider as well.   To learn more about what you can do with MyChart, go to [email or web address].    Your next appointment:   12 month(s)  The format for your next appointment:   In Person  Provider:   Larae Grooms, MD     Other Instructions

## 2021-12-19 NOTE — Progress Notes (Signed)
Cardiology Office Note   Date:  12/19/2021   ID:  [name], DOB 11/18/1951, MRN [id number]  PCP:  Fredirick Lathe, PA-C    Chief Complaint  Patient presents with   Follow-up   CAD  Wt Readings from Last 3 Encounters:  12/19/21 178 lb (80.7 kg)  11/24/21 178 lb 11.2 oz (81.1 kg)  11/08/21 177 lb 1.6 oz (80.3 kg)       History of Present Illness: [name] is a 71 y.o. female   who has had a complex medical history including issues with falls and a brain calcification.  She was worked up for Parkinson's due to a fall and tremor.  She is following up with neurology.  She noted in August 2020 that she was retaining some fluid in her legs.  She tried furosemide but this did not help much.   In 2020, she also noted that her blood pressure cuff was giving her a "heart symbol."  She was feeling some palpitations.  She had some random, atypical chest pain as well.  She has been allergic to several blood pressure and diabetes medications.   In January 2021, monitor showed:NSR with occasional PACs and PVCs. Rare short bursts of tachycardia lasting only seconds. No sustained arrhythmias. No atrial fibrillation.   In 2021, she had an episode of stroke like sx.  She was taken to [facility].  She was diagnosed with low potassium, B12.   Coronary CTA  In 5/21 showed:  " Three vessel mild non-obstructive CAD, CADRADS = 2.   2. Coronary calcium score of 879. This was 96th percentile for age and sex matched control.   3.  Normal coronary origin with right dominance.   4.  PFO present with evidence of left to right flow."   She was diagnosed with endometrial cancer in 2021.  She had a total hysterectomy.  She has continued to have dizziness and falling.  Amitriptyline was stopped and sx improved.    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 name of MD    Diabetes mellitus    type 2    Dyspnea    with exertion    Dysrhythmia    "skips beats"   Family history of adverse reaction to anesthesia    son stopped breathing during surgery , mother slow to wake up    Fibromyalgia 1980's   Generalized anxiety disorder    GERD (gastroesophageal reflux disease)    occ- will use Tums and Prolosec  prn    Heart murmur    Hyperlipemia    Hypertension    not on blood pressure meds due to allergies - last dose of meds- months ago    Hypothyroidism    Irritable bowel syndrome 1980's   Major depressive disorder    Migraine headaches 1980's   On meds-well controlled   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   Morbid obesity ([place])    Pneumonia 2009   Several times over the past several years   PONV (postoperative nausea and vomiting)    was  told by neurologist due to calcificaton in brain not to be put to sleep, N/V during Op and Recovery    Recurrent upper respiratory infection (URI)    Sleep apnea    no cpap   Subarachnoid hemorrhage ([place]) 2010   Vaginal polyp    benign per pt    Past Surgical History:  Procedure Laterality Date   CHOLECYSTECTOMY  1983   COLONOSCOPY  2008   DB   DILATION AND CURETTAGE OF UTERUS  1972   HYSTEROSCOPY WITH D & C  06/29/2011   Procedure: DILATATION AND CURETTAGE (D&C) /HYSTEROSCOPY;  Surgeon: Donnamae Jude, MD;  Location: [facility];  Service: Gynecology;  Laterality: N/A;   ROBOTIC ASSISTED TOTAL HYSTERECTOMY WITH BILATERAL SALPINGO OOPHERECTOMY N/A 10/27/2020   Procedure: XI ROBOTIC ASSISTED TOTAL HYSTERECTOMY WITH BILATERAL SALPINGO OOPHORECTOMY;  Surgeon: Everitt Amber, MD;  Location: [facility];  Service: Gynecology;  Laterality: N/A;   SENTINEL NODE BIOPSY N/A 10/27/2020   Procedure: SENTINEL NODE BIOPSY;  Surgeon:  Everitt Amber, MD;  Location: [facility];  Service: Gynecology;  Laterality: N/A;     Current Outpatient Medications  Medication Sig Dispense Refill   acetaminophen (TYLENOL) 500 MG tablet Take 500 mg by mouth every 6 (six) hours as needed for moderate pain.     alum & mag hydroxide-simeth (MAALOX MAX) 400-400-40 MG/5ML suspension Take 10 mLs by mouth every 6 (six) hours as needed for indigestion. 355 mL 0   bumetanide (BUMEX) 1 MG tablet Take 1 mg by mouth every morning.     buPROPion (WELLBUTRIN XL) 150 MG 24 hr tablet Take 1 tablet by mouth in the morning, at noon, and at bedtime. Patient taking 2 tabs once a day     cyanocobalamin (,VITAMIN B-12,) 1000 MCG/ML injection Inject 1,000 mcg into the muscle every 30 (thirty) days.      dicyclomine (BENTYL) 10 MG capsule Take 1 capsule (10 mg total) by mouth every 8 (eight) hours as needed for spasms. 90 capsule 11   diphenoxylate-atropine (LOMOTIL) 2.5-0.025 MG tablet TAKE 1 TABLET BY MOUTH TWICE DAILY AS NEEDED FOR  DIARRHEA  OR  LOOSE  STOOLS 60 tablet 0   Evolocumab (REPATHA SURECLICK) 378 MG/ML SOAJ INJECT 1 PEN SUBCUTANEOUSLY EVERY 14 DAYS 2 mL 11   levothyroxine (SYNTHROID, LEVOTHROID) 50 MCG tablet Take 50 mcg by mouth daily.     nystatin ointment (MYCOSTATIN) Apply 1 application topically in the morning. 45 g 0   pantoprazole (PROTONIX) 40 MG tablet Take 1 tablet (40 mg total) by mouth daily. 30 tablet 11   Potassium 99 MG TABS Take 198 mg by mouth daily.     triamcinolone ointment (KENALOG) 0.5 % Apply 1 application topically at bedtime. 45 g 0   No current facility-administered medications for this visit.    Allergies:   Atenolol, Atorvastatin, Canagliflozin, Losartan potassium, Acarbose, Cinnamon, Empagliflozin, Glimepiride, Metformin and related, Other, Pioglitazone, Pravastatin, Sitagliptin, Spironolactone-hctz, Statins, Sulfamethoxazole, Tramadol hcl, and Sulfa antibiotics    Social History:  The patient  reports that she has never  smoked. She has never used smokeless tobacco. She reports that she does not drink alcohol and does not use drugs.   Family History:  The patient's family history includes Anesthesia problems in her granddaughter, mother, and son; Arthritis in an other family member; Bladder Cancer in her father; Cerebrovascular Disease in her mother; Colon cancer in her father; Diabetes in an other family member; High blood pressure in an other family member; Liver cancer in her  father; Schizophrenia in her daughter; Tremor in her son.    ROS:  Please see the history of present illness.   Otherwise, review of systems are positive for dizziness, falling.   All other systems are reviewed and negative.    PHYSICAL EXAM: VS:  BP 120/72    Pulse 96    Ht 5' 2.75" (1.594 m)    Wt 178 lb (80.7 kg)    SpO2 97%    BMI 31.78 kg/m  , BMI Body mass index is 31.78 kg/m. GEN: Well nourished, well developed, in no acute distress HEENT: normal Neck: no JVD, carotid bruits, or masses Cardiac: RRR; no murmurs, rubs, or gallops,no edema  Respiratory:  clear to auscultation bilaterally, normal work of breathing GI: soft, nontender, nondistended, + BS MS: no deformity or atrophy Skin: warm and dry, no rash Neuro:  Strength and sensation are intact Psych: euthymic mood, full affect   EKG:   The ekg ordered today demonstrates NSR, LAD   Recent Labs: 11/15/2021: ALT 23; BUN 22; Creatinine, Ser 1.43; Hemoglobin 12.8; Platelets 205; Potassium 3.9; Sodium 139   Lipid Panel    Component Value Date/Time   CHOL 134 08/16/2021 1018   TRIG 172 (H) 08/16/2021 1018   HDL 36 (L) 08/16/2021 1018   CHOLHDL 3.7 08/16/2021 1018   CHOLHDL 3.1 10/29/2020 0244   VLDL 27 10/29/2020 0244   LDLCALC 69 08/16/2021 1018   LDLDIRECT 130 (H) 05/10/2020 1659     Other studies Reviewed: Additional studies/ records that were reviewed today with results demonstrating: LDL 66 in 08/2021.   ASSESSMENT AND PLAN:  PACs/PVCs: No atrial  fibrillation on prior monitor.  Occasional skipped beats.  Coronary artery calcification: Elevated calcium score.  Nonobstructive CAD by prior CTA.  Hyperlipidemia managed with Repatha.  Needs tight control of lipids given her moderate carotid artery disease as well. Diabetes: Whole food, plant-based diet recommended.  In September 2022 A1c was 8.5, increased from 2021 when it was 7.2.  She reports that she is allergic to all diabetic medication.  We will check with our Pharm.D. if there are any other options for treatment for her.  Multiple intolerances are listed for her.  I am not sure that they are true allergies.  Avoid processed foods. Fluid retention: Taking Bumex.  BP controlled.  She can decrease the frequency of the Bumex if there is no swelling in her ankles, and take it as needed.  This may help with some of her dizziness.  I encouraged her to stay well-hydrated.  She should decrease soda intake.  Decreasing Bumex may also decrease likelihood of kidney stones.  Hypertension: She did not tolerate medications.  Blood pressure has been managed with a low-salt diet.  BP controlled today.  Use cane to avoid falling.  Also being followed for kidney stones.   Current medicines are reviewed at length with the patient today.  The patient concerns regarding her medicines were addressed.  The following changes have been made:  No change  Labs/ tests ordered today include:  No orders of the defined types were placed in this encounter.   Recommend 150 minutes/week of aerobic exercise Low fat, low carb, high fiber diet recommended  Disposition:   FU in 1 year   Signed, Larae Grooms, MD  12/19/2021 1:42 PM    [facility] [address], [place], [place]  [address] Phone: [phone]; Fax: [phone]

## 2021-12-25 ENCOUNTER — Ambulatory Visit: Payer: Medicare Other | Admitting: Obstetrics and Gynecology

## 2021-12-28 ENCOUNTER — Encounter: Payer: Self-pay | Admitting: Physician Assistant

## 2021-12-28 ENCOUNTER — Ambulatory Visit (INDEPENDENT_AMBULATORY_CARE_PROVIDER_SITE_OTHER): Payer: 59 | Admitting: Physician Assistant

## 2021-12-28 ENCOUNTER — Other Ambulatory Visit: Payer: Self-pay

## 2021-12-28 VITALS — BP 158/91 | HR 90 | Temp 97.2°F | Ht 62.75 in | Wt 181.2 lb

## 2021-12-28 DIAGNOSIS — E538 Deficiency of other specified B group vitamins: Secondary | ICD-10-CM

## 2021-12-28 DIAGNOSIS — R42 Dizziness and giddiness: Secondary | ICD-10-CM | POA: Diagnosis not present

## 2021-12-28 DIAGNOSIS — E119 Type 2 diabetes mellitus without complications: Secondary | ICD-10-CM | POA: Diagnosis not present

## 2021-12-28 DIAGNOSIS — E1159 Type 2 diabetes mellitus with other circulatory complications: Secondary | ICD-10-CM | POA: Diagnosis not present

## 2021-12-28 DIAGNOSIS — R296 Repeated falls: Secondary | ICD-10-CM | POA: Diagnosis not present

## 2021-12-28 DIAGNOSIS — R600 Localized edema: Secondary | ICD-10-CM

## 2021-12-28 DIAGNOSIS — E039 Hypothyroidism, unspecified: Secondary | ICD-10-CM

## 2021-12-28 DIAGNOSIS — G479 Sleep disorder, unspecified: Secondary | ICD-10-CM | POA: Diagnosis not present

## 2021-12-28 DIAGNOSIS — I152 Hypertension secondary to endocrine disorders: Secondary | ICD-10-CM

## 2021-12-28 LAB — VITAMIN B12: Vitamin B-12: 571 pg/mL (ref 211–911)

## 2021-12-28 LAB — COMPREHENSIVE METABOLIC PANEL
ALT: 22 U/L (ref 0–35)
AST: 22 U/L (ref 0–37)
Albumin: 4.1 g/dL (ref 3.5–5.2)
Alkaline Phosphatase: 68 U/L (ref 39–117)
BUN: 25 mg/dL — ABNORMAL HIGH (ref 6–23)
CO2: 31 mEq/L (ref 19–32)
Calcium: 9.4 mg/dL (ref 8.4–10.5)
Chloride: 100 mEq/L (ref 96–112)
Creatinine, Ser: 1.13 mg/dL (ref 0.40–1.20)
GFR: 48.71 mL/min — ABNORMAL LOW (ref >60.00)
Glucose, Bld: 189 mg/dL — ABNORMAL HIGH (ref 70–99)
Potassium: 4.5 mEq/L (ref 3.5–5.1)
Sodium: 139 mEq/L (ref 135–145)
Total Bilirubin: 0.5 mg/dL (ref 0.2–1.2)
Total Protein: 6.9 g/dL (ref 6.0–8.3)

## 2021-12-28 LAB — CBC WITH DIFFERENTIAL/PLATELET
Basophils Absolute: 0 10*3/uL (ref 0.0–0.1)
Basophils Relative: 1 % (ref 0.0–3.0)
Eosinophils Absolute: 0.1 10*3/uL (ref 0.0–0.7)
Eosinophils Relative: 3.1 % (ref 0.0–5.0)
HCT: 39 % (ref 36.0–46.0)
Hemoglobin: 12.9 g/dL (ref 12.0–15.0)
Lymphocytes Relative: 25.8 % (ref 12.0–46.0)
Lymphs Abs: 1.1 10*3/uL (ref 0.7–4.0)
MCHC: 33.1 g/dL (ref 30.0–36.0)
MCV: 88 fl (ref 78.0–100.0)
Monocytes Absolute: 0.4 10*3/uL (ref 0.1–1.0)
Monocytes Relative: 8.9 % (ref 3.0–12.0)
Neutro Abs: 2.7 10*3/uL (ref 1.4–7.7)
Neutrophils Relative %: 61.2 % (ref 43.0–77.0)
Platelets: 187 10*3/uL (ref 150.0–400.0)
RBC: 4.43 Mil/uL (ref 3.87–5.11)
RDW: 14.8 % (ref 11.5–15.5)
WBC: 4.3 10*3/uL (ref 4.0–10.5)

## 2021-12-28 LAB — HEMOGLOBIN A1C: Hgb A1c MFr Bld: 7.4 % — ABNORMAL HIGH (ref 4.6–6.5)

## 2021-12-28 LAB — TSH: TSH: 0.79 u[IU]/mL (ref 0.35–5.50)

## 2021-12-28 MED ORDER — BUMETANIDE 1 MG PO TABS: 1.0000 mg | ORAL_TABLET | Freq: Every morning | ORAL | 1 refills | Status: DC

## 2021-12-28 MED ORDER — LEVOTHYROXINE SODIUM 50 MCG PO TABS: 50.0000 ug | ORAL_TABLET | Freq: Every day | ORAL | 1 refills | Status: DC

## 2021-12-28 NOTE — Patient Instructions (Addendum)
Good to see you today! Please go to the lab for blood work and I will send results through [place]. Referral for CCM monitoring placed to help with medication management.  Bumex and Synthroid refilled.  F/up in 3 months, call if any concerns.

## 2021-12-28 NOTE — Progress Notes (Signed)
Subjective:    Patient ID: [name], female    DOB: Feb 28, 1951, 71 y.o.   MRN: [id number]  Chief Complaint  Patient presents with   Follow-up    HPI Patient is in today for recheck today from our first visit on 10/20/21.   Specialists seen since 10/20/21: -Dr. Nehemiah Settle, GYN for Vaginal cuff cellulitis, vaginal discharge, and vaginal prolapse -ED on 11/15/21 for vaginitis, discharged in stable condition -Dr. Ilda Basset on 11/24/21 - started on Mycolog and told to f/up with Gyn Onc -Dr. Irish Lack, on 12/19/21 - Cardiology  Imaging review: CT Abd/ pelvis - 12/04/21 - No cancer. Tiny renal stones. Diverticulosis, old spinal compression fracture.  Acute concerns: Dizziness - doing much better per patient. States she is off amitriptyline as of a few weeks ago (previously took this for migraines), ??weaned herself off. "50% of the dizziness is now gone." She is now taking Wellbutrin XL 150 mg BID instead of TID as well.   Sleep has not been very good now though, per patient. Other night only about 2.5 hours of sleep.  Needs labs checked.   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 name of MD    Diabetes mellitus    type 2    Dyspnea    with exertion    Dysrhythmia    "skips beats"   Family history of adverse reaction to anesthesia    son stopped breathing during surgery , mother slow to wake up    Fibromyalgia 1980's   Generalized anxiety disorder    GERD (gastroesophageal reflux disease)    occ- will use Tums and Prolosec  prn    Heart murmur    Hyperlipemia    Hypertension    not on blood pressure meds due to allergies - last  dose of meds- months ago    Hypothyroidism    Irritable bowel syndrome 1980's   Major depressive disorder    Migraine headaches 1980's   On meds-well controlled   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   Morbid obesity ([place])    Pneumonia 2009   Several times over the past several years   PONV (postoperative nausea and vomiting)    was told by neurologist due to calcificaton in brain not to be put to sleep, N/V during Op and Recovery    Recurrent upper respiratory infection (URI)    Sleep apnea    no cpap   Subarachnoid hemorrhage ([place]) 2010   Vaginal polyp    benign per pt    Past Surgical History:  Procedure Laterality Date   CHOLECYSTECTOMY  1983   COLONOSCOPY  2008   DB   DILATION AND CURETTAGE OF UTERUS  1972   HYSTEROSCOPY WITH D & C  06/29/2011   Procedure: DILATATION AND CURETTAGE (D&C) /HYSTEROSCOPY;  Surgeon: Donnamae Jude, MD;  Location: [place] [facility];  Service: Gynecology;  Laterality: N/A;   ROBOTIC ASSISTED TOTAL HYSTERECTOMY WITH BILATERAL SALPINGO OOPHERECTOMY N/A 10/27/2020   Procedure: XI ROBOTIC ASSISTED TOTAL HYSTERECTOMY WITH BILATERAL SALPINGO OOPHORECTOMY;  Surgeon: Denman George,  Terrence Dupont, MD;  Location: [facility];  Service: Gynecology;  Laterality: N/A;   SENTINEL NODE BIOPSY N/A 10/27/2020   Procedure: SENTINEL NODE BIOPSY;  Surgeon: Everitt Amber, MD;  Location: [facility];  Service: Gynecology;  Laterality: N/A;    Family History  Problem Relation Age of Onset   Liver cancer Father        mets to liver    Bladder Cancer Father    Colon cancer Father        mets to colon    Anesthesia problems Mother        hard to wake post op    Cerebrovascular Disease Mother        Never had stroke, but had CEA.   High blood pressure Other    Diabetes Other    Arthritis Other    Anesthesia problems Son        stopped breathing post op    Tremor Son    Schizophrenia Daughter        Schizoaffective disorder   Anesthesia problems Granddaughter        PONV   Dementia Neg Hx     Esophageal cancer Neg Hx    Rectal cancer Neg Hx    Stomach cancer Neg Hx    Colon polyps Neg Hx     Social History   Tobacco Use   Smoking status: Never   Smokeless tobacco: Never  Vaping Use   Vaping Use: Never used  Substance Use Topics   Alcohol use: No    Comment: quit 1978   Drug use: No     Allergies  Allergen Reactions   Atenolol     Stomach problems, chills   Atorvastatin     Other reaction(s): retain fld   Canagliflozin     ALL DIABETIC MEDS PER PT   abdominal pain, headache   Losartan Potassium     chills, HA, stomach pain   Acarbose Nausea Only     headache,nausea   Cinnamon     Stomach pain   Empagliflozin      headaches   Glimepiride     ALL DIABETIC MEDS PER PT  abdominal pain, headache   Metformin And Related Nausea And Vomiting and Other (See Comments)    Patient states that she has severe chills, headache and cramping additionally. Says blood sugar is uncontrolled because of this   Other Other (See Comments)    ALL DIABETIC MEDS PER PT    Pioglitazone Other (See Comments)    ALL DIABETIC MEDS PER PT  Other reaction(s): peripheral edema   Pravastatin     Other reaction(s): retain fld   Sitagliptin     ALL DIABETIC MEDS PER PT  Other reaction(s): headache   Spironolactone-Hctz Other (See Comments)   Statins     Myalgia   Sulfamethoxazole Other (See Comments)    ALL DIABETIC MEDS PER PT    Tramadol Hcl Itching   Sulfa Antibiotics Rash and Other (See Comments)    Mother has told patient in the past not to take, she cannot recall there reaction    Review of Systems NEGATIVE UNLESS OTHERWISE INDICATED IN HPI      Objective:     BP (!) 158/91    Pulse 90    Temp (!) 97.2 F (36.2 C)    Ht 5' 2.75" (1.594 m)    Wt 181 lb 3.2 oz (82.2 kg)    SpO2 98%    BMI 32.35 kg/m  Wt Readings from Last 3 Encounters:  12/28/21 181 lb 3.2 oz (82.2 kg)  12/19/21 178 lb (80.7 kg)  11/24/21 178 lb 11.2 oz (81.1 kg)    BP Readings from Last 3  Encounters:  12/28/21 (!) 158/91  12/19/21 120/72  11/24/21 (!) 156/86     Physical Exam Vitals and nursing note reviewed.  Constitutional:      Appearance: Normal appearance. She is normal weight. She is not toxic-appearing.     Comments: Using a cane  HENT:     Head: Normocephalic and atraumatic.     Right Ear: Tympanic membrane, ear canal and external ear normal.     Left Ear: Tympanic membrane, ear canal and external ear normal.     Nose: Nose normal.     Mouth/Throat:     Mouth: Mucous membranes are moist.  Eyes:     Extraocular Movements: Extraocular movements intact.     Conjunctiva/sclera: Conjunctivae normal.     Pupils: Pupils are equal, round, and reactive to light.  Cardiovascular:     Rate and Rhythm: Normal rate and regular rhythm.     Pulses: Normal pulses.     Heart sounds: Normal heart sounds.  Pulmonary:     Effort: Pulmonary effort is normal.     Breath sounds: Normal breath sounds.  Musculoskeletal:        General: Normal range of motion.     Cervical back: Normal range of motion and neck supple.  Skin:    General: Skin is warm and dry.  Neurological:     General: No focal deficit present.     Mental Status: She is alert and oriented to person, place, and time.     Motor: Weakness (diffuse) and tremor present.     Gait: Tandem walk abnormal.       Assessment & Plan:   Problem List Items Addressed This Visit       Endocrine   Diabetes mellitus type II, non insulin dependent ([place]) - Primary (Chronic)   Relevant Orders   CBC with Differential/Platelet (Completed)   Comprehensive metabolic panel (Completed)   Hemoglobin A1c (Completed)   TSH (Completed)   [facility] pharmacy monitoring   Hypothyroidism   Relevant Medications   levothyroxine (SYNTHROID) 50 MCG tablet   Other Relevant Orders   TSH (Completed)   [facility] pharmacy monitoring   Other Visit Diagnoses     Hypertension associated with diabetes ([place])       Relevant Medications   bumetanide  (BUMEX) 1 MG tablet   Other Relevant Orders   CBC with Differential/Platelet (Completed)   Comprehensive metabolic panel (Completed)   CCM pharmacy monitoring   Recurrent falls       Relevant Orders   CBC with Differential/Platelet (Completed)   Comprehensive metabolic panel (Completed)   Hemoglobin A1c (Completed)   TSH (Completed)   CCM pharmacy monitoring   Dizziness       Relevant Orders   CBC with Differential/Platelet (Completed)   Comprehensive metabolic panel (Completed)   TSH (Completed)   CCM pharmacy monitoring   Difficulty sleeping       Relevant Orders   CBC with Differential/Platelet (Completed)   Comprehensive metabolic panel (Completed)   Hemoglobin A1c (Completed)   TSH (Completed)   CCM pharmacy monitoring   B12 deficiency       Relevant Orders   Vitamin B12 (Completed)   CCM pharmacy monitoring   Fluid retention in legs  Meds ordered this encounter  Medications   bumetanide (BUMEX) 1 MG tablet    Sig: Take 1 tablet (1 mg total) by mouth every morning.    Dispense:  90 tablet    Refill:  1   levothyroxine (SYNTHROID) 50 MCG tablet    Sig: Take 1 tablet (50 mcg total) by mouth daily.    Dispense:  90 tablet    Refill:  1   1. Diabetes mellitus type II, non insulin dependent ([place]) -Recheck Ha1c and CMP today -Diet controlled, numerous allergies reported. Will also set her up with CCM to help with advising if there are any medications she might be able to take.  2. Hypothyroidism, unspecified type -Repeat TSH  -Synthroid 50 mcg  3. Hypertension associated with diabetes ([place]) -Elevated reading today, monitor at home once daily -She did not tolerate medications, low-salt diet advised -Following with cardiology as well   4. Recurrent falls 5. Dizziness -She says she has appt with Neurology next month with [facility], which will hopefully help provide more answers -Caution with changing positions  6. Difficulty sleeping -Consider sleep  study  7. B12 deficiency -Check level today  8. Fluid retention in legs -I don't see any edema in her legs today. I agree with Dr. Irish Lack that decreasing Bumex may help her dizziness as well, pt refuses to lower dose or stop medication at this time.    This note was prepared with assistance of Systems analyst. Occasional wrong-word or sound-a-like substitutions may have occurred due to the inherent limitations of voice recognition software.  Time Spent: 50 minutes of total time was spent on the date of the encounter performing the following actions: chart review prior to seeing the patient, obtaining history, performing a medically necessary exam, counseling on the treatment plan, placing orders, and documenting in our EHR.    Tomorrow Dehaas M Kennet Mccort, PA-C

## 2021-12-29 ENCOUNTER — Telehealth: Payer: Self-pay | Admitting: Physician Assistant

## 2021-12-29 NOTE — Telephone Encounter (Signed)
Pt called to get her lab results. She is wanting to know about her sugar.

## 2022-01-01 ENCOUNTER — Telehealth: Payer: Self-pay

## 2022-01-01 NOTE — Telephone Encounter (Signed)
error 

## 2022-01-01 NOTE — Telephone Encounter (Signed)
Patient notified

## 2022-01-02 ENCOUNTER — Ambulatory Visit (INDEPENDENT_AMBULATORY_CARE_PROVIDER_SITE_OTHER): Payer: 59 | Admitting: Obstetrics and Gynecology

## 2022-01-02 ENCOUNTER — Encounter: Payer: Self-pay | Admitting: Obstetrics and Gynecology

## 2022-01-02 ENCOUNTER — Other Ambulatory Visit: Payer: Self-pay

## 2022-01-02 VITALS — BP 156/92 | HR 92 | Ht 62.0 in | Wt 176.0 lb

## 2022-01-02 DIAGNOSIS — R159 Full incontinence of feces: Secondary | ICD-10-CM

## 2022-01-02 DIAGNOSIS — N816 Rectocele: Secondary | ICD-10-CM

## 2022-01-02 DIAGNOSIS — R350 Frequency of micturition: Secondary | ICD-10-CM

## 2022-01-02 DIAGNOSIS — N952 Postmenopausal atrophic vaginitis: Secondary | ICD-10-CM

## 2022-01-02 DIAGNOSIS — N993 Prolapse of vaginal vault after hysterectomy: Secondary | ICD-10-CM

## 2022-01-02 DIAGNOSIS — N8110 Cystocele, unspecified: Secondary | ICD-10-CM | POA: Diagnosis not present

## 2022-01-02 LAB — POCT URINALYSIS DIPSTICK
Bilirubin, UA: NEGATIVE
Blood, UA: NEGATIVE
Glucose, UA: POSITIVE — AB
Ketones, UA: NEGATIVE
Nitrite, UA: NEGATIVE
Protein, UA: NEGATIVE
Spec Grav, UA: 1.02 (ref 1.010–1.025)
Urobilinogen, UA: 0.2 E.U./dL
pH, UA: 5.5 (ref 5.0–8.0)

## 2022-01-02 NOTE — Progress Notes (Signed)
[facility] New Patient Evaluation and Consultation  Referring Provider: Truett Mainland, DO PCP: Fredirick Lathe, PA-C Date of Service: 01/02/2022  SUBJECTIVE Chief Complaint: New Patient (Initial Visit) ([name] is a 71 y.o. female here for a f/u on prolapse.)  History of Present Illness: [name] is a 71 y.o. White or Caucasian female seen in consultation at the request of Dr. Nehemiah Settle for evaluation of prolapse.    Review of records from Dr Woodroe Mode significant for: Has history of endometrial cancer and she is s/p RA- TLH, BSO, SLN biopsy. Was noted to have vaginal vault prolapse. Had some thickening of the vaginal wall, which was biopsied and noted to be benign.   Urinary Symptoms: Leaks urine with with a full bladder and with urgency Leaks 1 time(s) per day.  Pad use: liners/ mini-pads  She is not bothered by her UI symptoms.  Day time voids 4.  Nocturia: 3-4 times per night to void. Voiding dysfunction: she sometimes empties her bladder well.  does not use a catheter to empty bladder.  When urinating, she feels she has no difficulties  UTIs:  0  UTI's in the last year.   Denies history of blood in urine and kidney or bladder stones  Pelvic Organ Prolapse Symptoms:                  She Denies a feeling of a bulge the vaginal area.  Feels the prolapse- for about the last 6 weeks. Also has some pelvic cramping.  Feels that she has a lot of discharge. Prior aptima swab was negative.   Bowel Symptom: Bowel movements: 1 time(s) per day Stool consistency: soft  or loose Straining: no.  Splinting: no.  Incomplete evacuation: no.  She Admits to accidental bowel leakage / fecal incontinence  Occurs: when she has diarrhea  Consistency with leakage: liquid Bowel regimen: fiber Last colonoscopy: Date: 3 years ago, Results: pre cancer- 2 polyps She has diarrhea frequently. Stopped eating oatmeal and it improved. Tried a fiber supplement  and does not know why she stopped.   Sexual Function Sexually active: no.   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 name of MD    Diabetes mellitus    type 2    Dyspnea    with exertion    Dysrhythmia    "skips beats"   Family history of adverse reaction to anesthesia    son stopped breathing during surgery , mother slow to wake up    Fibromyalgia 1980's   Generalized anxiety disorder    GERD (gastroesophageal reflux disease)    occ- will use Tums and Prolosec  prn    Heart murmur    Hyperlipemia    Hypertension    not on blood pressure meds due to allergies - last dose of meds- months ago    Hypothyroidism    Irritable bowel syndrome 1980's   Major depressive disorder    Migraine  headaches 1980's   On meds-well controlled   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   Morbid obesity ([place])    Pneumonia 2009   Several times over the past several years   PONV (postoperative nausea and vomiting)    was told by neurologist due to calcificaton in brain not to be put to sleep, N/V during Op and Recovery    Recurrent upper respiratory infection (URI)    Sleep apnea    no cpap   Subarachnoid hemorrhage ([place]) 2010   Vaginal polyp    benign per pt     Past Surgical History:   Past Surgical History:  Procedure Laterality Date   CHOLECYSTECTOMY  1983   COLONOSCOPY  2008   DB   DILATION AND CURETTAGE OF UTERUS  1972   HYSTEROSCOPY WITH D & C  06/29/2011   Procedure: DILATATION AND CURETTAGE (D&C) /HYSTEROSCOPY;  Surgeon: Donnamae Jude, MD;  Location: [place] [facility];  Service: Gynecology;   Laterality: N/A;   ROBOTIC ASSISTED TOTAL HYSTERECTOMY WITH BILATERAL SALPINGO OOPHERECTOMY N/A 10/27/2020   Procedure: XI ROBOTIC ASSISTED TOTAL HYSTERECTOMY WITH BILATERAL SALPINGO OOPHORECTOMY;  Surgeon: Everitt Amber, MD;  Location: [facility];  Service: Gynecology;  Laterality: N/A;   SENTINEL NODE BIOPSY N/A 10/27/2020   Procedure: SENTINEL NODE BIOPSY;  Surgeon: Everitt Amber, MD;  Location: [facility];  Service: Gynecology;  Laterality: N/A;     Past OB/GYN History: OB History  Gravida Para Term Preterm AB Living  5 3 3  0 2 3  SAB IAB Ectopic Multiple Live Births  2       3    # Outcome Date GA Lbr Len/2nd Weight Sex Delivery Anes PTL Lv  5 SAB           4 SAB           3 Term           2 Term           1 Term             Vaginal deliveries: 3 S/p RA- TLH for endometrial cancer   Medications: She has a current medication list which includes the following prescription(s): acetaminophen, aspirin ec, bumetanide, bupropion, cyanocobalamin, diphenoxylate-atropine, repatha sureclick, levothyroxine, magnesium, nystatin ointment, pantoprazole, and potassium.   Allergies: Patient is allergic to atenolol, atorvastatin, canagliflozin, losartan potassium, acarbose, cinnamon, empagliflozin, glimepiride, metformin and related, other, pioglitazone, pravastatin, sitagliptin, spironolactone-hctz, statins, sulfamethoxazole, tramadol hcl, and sulfa antibiotics.   Social History:  Social History   Tobacco Use   Smoking status: Never   Smokeless tobacco: Never  Vaping Use   Vaping Use: Never used  Substance Use Topics   Alcohol use: No    Comment: quit 1978   Drug use: No    Relationship status: divorced She lives with daughter and granddaughter.   She is not employed. Regular exercise: No History of abuse: No  Family History:   Family History  Problem Relation Age of Onset   Liver cancer Father        mets to liver    Bladder Cancer Father    Colon cancer Father        mets to  colon    Anesthesia problems Mother        hard to wake post op    Cerebrovascular Disease Mother        Never had stroke, but had CEA.   High blood pressure Other    Diabetes Other    Arthritis  Other    Anesthesia problems Son        stopped breathing post op    Tremor Son    Schizophrenia Daughter        Schizoaffective disorder   Anesthesia problems Granddaughter        PONV   Dementia Neg Hx    Esophageal cancer Neg Hx    Rectal cancer Neg Hx    Stomach cancer Neg Hx    Colon polyps Neg Hx      Review of Systems: Review of Systems  Constitutional:  Negative for fever, malaise/fatigue and weight loss.  Respiratory:  Negative for cough, shortness of breath and wheezing.   Cardiovascular:  Positive for palpitations. Negative for chest pain and leg swelling.  Gastrointestinal:  Positive for abdominal pain. Negative for blood in stool.  Genitourinary:  Negative for dysuria.  Musculoskeletal:  Positive for myalgias.  Skin:  Negative for rash.  Neurological:  Positive for dizziness and headaches.  Endo/Heme/Allergies:  Does not bruise/bleed easily.  Psychiatric/Behavioral:  Positive for depression. The patient is nervous/anxious.     OBJECTIVE Physical Exam: Vitals:   01/02/22 1523  BP: (!) 156/92  Pulse: 92  Weight: 176 lb (79.8 kg)  Height: 5\' 2"  (1.575 m)    Physical Exam Constitutional:      General: She is not in acute distress. Pulmonary:     Effort: Pulmonary effort is normal.  Abdominal:     General: There is no distension.     Palpations: Abdomen is soft.     Tenderness: There is no abdominal tenderness. There is no rebound.  Musculoskeletal:        General: No swelling. Normal range of motion.  Skin:    General: Skin is warm and dry.     Findings: No rash.  Neurological:     Mental Status: She is alert and oriented to person, place, and time.  Psychiatric:        Mood and Affect: Mood normal.        Behavior: Behavior normal.     GU /  Detailed Urogynecologic Evaluation:  Pelvic Exam: Normal external female genitalia; Bartholin's and Skene's glands normal in appearance; urethral meatus normal in appearance, no urethral masses or discharge.   CST: negative   s/p hysterectomy: Speculum exam reveals normal vaginal mucosa with  atrophy- several vaginal abrasions/ fissures noted.   With apex supported, anterior compartment defect was reduced  Pelvic floor strength I/V  Pelvic floor musculature: Right levator non-tender, Right obturator non-tender, Left levator non-tender, Left obturator non-tender  POP-Q:   POP-Q  1                                            Aa   1                                           Ba  1                                              C   4.5  Gh  4                                            Pb  6                                            tvl   -1                                            Ap  -1                                            Bp                                                 D     Rectal Exam:  Normal external rectum  Post-Void Residual (PVR) by Bladder Scan: In order to evaluate bladder emptying, we discussed obtaining a postvoid residual and she agreed to this procedure.  Procedure: The ultrasound unit was placed on the patient's abdomen in the suprapubic region after the patient had voided. A PVR of 7 ml was obtained by bladder scan.  Laboratory Results: POC URINE: small leukocytes, negative nitrites   ASSESSMENT AND PLAN Ms. Borras is a 71 y.o. 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 Prolapse of anterior vaginal wall   3. Prolapse of posterior vaginal wall   4. Urinary frequency   5. Vaginal atrophy   6. Incontinence of feces, unspecified fecal incontinence type    Stage II anterior, Stage II posterior, Stage II apical prolapse - For treatment of pelvic organ prolapse, we discussed options  for management including expectant management, conservative management, and surgical management, such as Kegels, a pessary, pelvic floor physical therapy, and specific surgical procedures. - She is potentially interested in a pessary but is unsure at this time. Handout provided.   2. vaginal atrophy - we discussed use of vitamin E or coconut oil for vaginal moisture - could benefit from vaginal estrogen cream but will confer with GYN Onc team if this is appropriate in her case.   3. Fecal incontinence - Has frequent loose stools.  - Treatment options include anti-diarrhea medication (imodium otc), fiber supplements. She will start these daily.   She will let us know what treatment she decides   Jaquita Folds, MD

## 2022-01-02 NOTE — Patient Instructions (Addendum)
Accidental Bowel Leakage: Our goal is to achieve formed bowel movements daily or every-other-day without leakage.  You may need to try different combinations of the following options to find what works best for you.  Some management options include: Dietary changes (more leafy greens, vegetables and fruits; less processed foods) Fiber supplementation (Metamucil or something with psyllium as active ingredient) Over-the-counter imodium (tablets or liquid) to help solidify the stool and prevent leakage of stool.   Vulvovaginal moisturizer Options: Vitamin E oil (pump or capsule) or cream (Gene's Vit E Cream) Coconut oil Consider the ingredients of the product - the fewer the ingredients the better!  Directions for Use: Clean and dry your hands Gently dab the vulvar/vaginal area dry as needed Apply a pea-sized amount of the moisturizer onto your fingertip Using you other hand, open the labia  Apply the moisturizer to the vulvar/vaginal tissues Wear loose fitting underwear/clothing if possible following application Use moisturize up to 3 times daily as desired.

## 2022-01-03 ENCOUNTER — Ambulatory Visit (INDEPENDENT_AMBULATORY_CARE_PROVIDER_SITE_OTHER): Payer: 59 | Admitting: Physician Assistant

## 2022-01-03 ENCOUNTER — Encounter: Payer: Self-pay | Admitting: Physician Assistant

## 2022-01-03 VITALS — BP 120/70 | Ht 62.0 in | Wt 176.0 lb

## 2022-01-03 DIAGNOSIS — E538 Deficiency of other specified B group vitamins: Secondary | ICD-10-CM | POA: Diagnosis not present

## 2022-01-03 MED ORDER — CYANOCOBALAMIN 1000 MCG/ML IJ SOLN
1000.0000 ug | Freq: Once | INTRAMUSCULAR | Status: AC
  Administered 2022-01-03: 1000 ug via INTRAMUSCULAR

## 2022-01-03 MED ORDER — CYANOCOBALAMIN 1000 MCG/ML IJ SOLN
1000.0000 ug | Freq: Once | INTRAMUSCULAR | Status: DC
Start: 2022-01-03 — End: 2022-01-03

## 2022-01-04 ENCOUNTER — Telehealth: Payer: Self-pay | Admitting: Obstetrics and Gynecology

## 2022-01-04 DIAGNOSIS — N952 Postmenopausal atrophic vaginitis: Secondary | ICD-10-CM

## 2022-01-04 MED ORDER — ESTRADIOL 0.1 MG/GM VA CREA: 0.5000 g | TOPICAL_CREAM | VAGINAL | 11 refills | Status: DC

## 2022-01-04 NOTE — Telephone Encounter (Signed)
Please let patient know I spoke with the Oncology team and they were ok with her using vaginal estrogen, so I sent a prescription to her pharmacy. She should use a pea sized amount in the vagina every night for 2 weeks then only twice a week after that. Thanks.

## 2022-01-04 NOTE — Progress Notes (Signed)
Pt was here for B12 injection. Given in Left deltoid. Pt tolerated well.

## 2022-01-05 NOTE — Progress Notes (Signed)
Pt received a B12 injection on 12/03/2022.

## 2022-01-08 NOTE — Telephone Encounter (Signed)
Pt was notified of this message 01/05/22

## 2022-01-18 ENCOUNTER — Ambulatory Visit: Payer: Medicare Other | Admitting: Obstetrics and Gynecology

## 2022-01-24 ENCOUNTER — Encounter: Payer: Self-pay | Admitting: Neurology

## 2022-01-24 ENCOUNTER — Ambulatory Visit: Payer: Medicaid Other | Admitting: Podiatry

## 2022-01-25 ENCOUNTER — Telehealth: Payer: Self-pay

## 2022-01-25 NOTE — Telephone Encounter (Signed)
-----   Message from Marian Sorrow, LPN sent at 36/68/1594 11:18 AM EST ----- Marlena Clipper, can you take care of this for Alyssa. Thanks  Anselmo Pickler, LPN ----- Message ----- From: Fredirick Lathe, PA-C Sent: 11/13/2021   7:50 AM EST To: Marian Sorrow, LPN  Butch Penny, Can you please try to connect with patient about this or see if Tomasa Hosteller can discuss with patient?  Thanks! -AA  ----- Message ----- From: Fredirick Lathe, PA-C Sent: 10/24/2021   9:57 PM EST To: [facility]  Please inform patient that I reviewed her chart in depth and at this time I do feel like she would be best managed under our new physician, Dr. Cherlynn Kaiser, who starts next month. Patient's labs need to be updated, and I recommend having her call back in January to have a TOC visit scheduled with Dr. Cherlynn Kaiser and have these labs updated. I'm happy to address any acute concerns between now and then.   She has f/up scheduled with her movement disorder specialist in February. I'm so sorry I don't have anything else to offer in regards to her chronic neurologic condition.  I have placed a referral for home health services to help with fall preventions, PT, OT at her house.

## 2022-01-25 NOTE — Telephone Encounter (Signed)
I have left a message for Ms Lippman to schedule a TOC with Dr. Cherlynn Kaiser per Alyssa's note.

## 2022-01-25 NOTE — Telephone Encounter (Signed)
Pt is scheduled for TOC with Kulik on 03/12/22 at 1:30.

## 2022-01-30 ENCOUNTER — Other Ambulatory Visit: Payer: Self-pay

## 2022-01-30 ENCOUNTER — Encounter (HOSPITAL_BASED_OUTPATIENT_CLINIC_OR_DEPARTMENT_OTHER): Payer: Self-pay | Admitting: Emergency Medicine

## 2022-01-30 ENCOUNTER — Emergency Department (HOSPITAL_BASED_OUTPATIENT_CLINIC_OR_DEPARTMENT_OTHER): Payer: Medicare Other

## 2022-01-30 ENCOUNTER — Ambulatory Visit (INDEPENDENT_AMBULATORY_CARE_PROVIDER_SITE_OTHER): Payer: Medicare Other

## 2022-01-30 ENCOUNTER — Observation Stay (HOSPITAL_BASED_OUTPATIENT_CLINIC_OR_DEPARTMENT_OTHER)
Admission: EM | Admit: 2022-01-30 | Discharge: 2022-02-07 | Disposition: A | Discharge status: Rehabilitation Facility | Payer: Medicare Other | Attending: Internal Medicine | Admitting: Internal Medicine

## 2022-01-30 DIAGNOSIS — I10 Essential (primary) hypertension: Secondary | ICD-10-CM | POA: Diagnosis present

## 2022-01-30 DIAGNOSIS — R296 Repeated falls: Secondary | ICD-10-CM

## 2022-01-30 DIAGNOSIS — F0390 Unspecified dementia without behavioral disturbance: Secondary | ICD-10-CM | POA: Diagnosis present

## 2022-01-30 DIAGNOSIS — E119 Type 2 diabetes mellitus without complications: Secondary | ICD-10-CM

## 2022-01-30 DIAGNOSIS — M79641 Pain in right hand: Secondary | ICD-10-CM | POA: Diagnosis present

## 2022-01-30 DIAGNOSIS — M79642 Pain in left hand: Secondary | ICD-10-CM | POA: Diagnosis present

## 2022-01-30 DIAGNOSIS — E440 Moderate protein-calorie malnutrition: Secondary | ICD-10-CM | POA: Insufficient documentation

## 2022-01-30 DIAGNOSIS — S0181XA Laceration without foreign body of other part of head, initial encounter: Secondary | ICD-10-CM

## 2022-01-30 DIAGNOSIS — S22069A Unspecified fracture of T7-T8 vertebra, initial encounter for closed fracture: Secondary | ICD-10-CM | POA: Diagnosis present

## 2022-01-30 DIAGNOSIS — E785 Hyperlipidemia, unspecified: Secondary | ICD-10-CM | POA: Diagnosis present

## 2022-01-30 DIAGNOSIS — S14129A Central cord syndrome at unspecified level of cervical spinal cord, initial encounter: Secondary | ICD-10-CM

## 2022-01-30 DIAGNOSIS — E039 Hypothyroidism, unspecified: Secondary | ICD-10-CM | POA: Diagnosis present

## 2022-01-30 DIAGNOSIS — M4802 Spinal stenosis, cervical region: Secondary | ICD-10-CM | POA: Diagnosis present

## 2022-01-30 DIAGNOSIS — E538 Deficiency of other specified B group vitamins: Secondary | ICD-10-CM | POA: Diagnosis not present

## 2022-01-30 DIAGNOSIS — W010XXA Fall on same level from slipping, tripping and stumbling without subsequent striking against object, initial encounter: Secondary | ICD-10-CM | POA: Diagnosis not present

## 2022-01-30 DIAGNOSIS — R1310 Dysphagia, unspecified: Secondary | ICD-10-CM | POA: Insufficient documentation

## 2022-01-30 DIAGNOSIS — E1165 Type 2 diabetes mellitus with hyperglycemia: Secondary | ICD-10-CM | POA: Diagnosis not present

## 2022-01-30 DIAGNOSIS — Z23 Encounter for immunization: Secondary | ICD-10-CM | POA: Insufficient documentation

## 2022-01-30 DIAGNOSIS — S12600A Unspecified displaced fracture of seventh cervical vertebra, initial encounter for closed fracture: Secondary | ICD-10-CM | POA: Diagnosis not present

## 2022-01-30 DIAGNOSIS — R251 Tremor, unspecified: Secondary | ICD-10-CM | POA: Insufficient documentation

## 2022-01-30 DIAGNOSIS — Z8542 Personal history of malignant neoplasm of other parts of uterus: Secondary | ICD-10-CM

## 2022-01-30 DIAGNOSIS — Z6831 Body mass index (BMI) 31.0-31.9, adult: Secondary | ICD-10-CM

## 2022-01-30 DIAGNOSIS — E669 Obesity, unspecified: Secondary | ICD-10-CM | POA: Diagnosis not present

## 2022-01-30 DIAGNOSIS — Y92009 Unspecified place in unspecified non-institutional (private) residence as the place of occurrence of the external cause: Secondary | ICD-10-CM | POA: Insufficient documentation

## 2022-01-30 DIAGNOSIS — R42 Dizziness and giddiness: Secondary | ICD-10-CM | POA: Diagnosis present

## 2022-01-30 DIAGNOSIS — G43909 Migraine, unspecified, not intractable, without status migrainosus: Secondary | ICD-10-CM | POA: Diagnosis not present

## 2022-01-30 DIAGNOSIS — Z79899 Other long term (current) drug therapy: Secondary | ICD-10-CM | POA: Diagnosis not present

## 2022-01-30 DIAGNOSIS — Z20822 Contact with and (suspected) exposure to covid-19: Secondary | ICD-10-CM | POA: Diagnosis not present

## 2022-01-30 DIAGNOSIS — M4856XA Collapsed vertebra, not elsewhere classified, lumbar region, initial encounter for fracture: Secondary | ICD-10-CM | POA: Diagnosis not present

## 2022-01-30 DIAGNOSIS — Z79890 Hormone replacement therapy (postmenopausal): Secondary | ICD-10-CM | POA: Diagnosis not present

## 2022-01-30 HISTORY — DX: Unspecified dementia, unspecified severity, without behavioral disturbance, psychotic disturbance, mood disturbance, and anxiety: F03.90

## 2022-01-30 HISTORY — DX: Hyperlipidemia, unspecified: E78.5

## 2022-01-30 HISTORY — DX: Malignant (primary) neoplasm, unspecified: C80.1

## 2022-01-30 HISTORY — DX: Obstructive sleep apnea (adult) (pediatric): G47.33

## 2022-01-30 HISTORY — DX: Type 2 diabetes mellitus without complications: E11.9

## 2022-01-30 HISTORY — DX: Vitamin B12 deficiency anemia due to intrinsic factor deficiency: D51.0

## 2022-01-30 HISTORY — DX: Essential (primary) hypertension: I10

## 2022-01-30 HISTORY — DX: Hypothyroidism, unspecified: E03.9

## 2022-01-30 LAB — RESP PANEL BY RT-PCR (FLU A&B, COVID) ARPGX2
Influenza A by PCR: NEGATIVE
Influenza B by PCR: NEGATIVE
SARS Coronavirus 2 by RT PCR: NEGATIVE

## 2022-01-30 LAB — BASIC METABOLIC PANEL
Anion gap: 9 (ref 5–15)
BUN: 16 mg/dL (ref 8–23)
CO2: 23 mmol/L (ref 22–32)
Calcium: 8.6 mg/dL — ABNORMAL LOW (ref 8.9–10.3)
Chloride: 105 mmol/L (ref 98–111)
Creatinine, Ser: 1.22 mg/dL — ABNORMAL HIGH (ref 0.44–1.00)
GFR, Estimated: 47 mL/min — ABNORMAL LOW (ref >60)
Glucose, Bld: 214 mg/dL — ABNORMAL HIGH (ref 70–99)
Potassium: 3.5 mmol/L (ref 3.5–5.1)
Sodium: 137 mmol/L (ref 135–145)

## 2022-01-30 LAB — CBC WITH DIFFERENTIAL/PLATELET
Abs Immature Granulocytes: 0.01 10*3/uL (ref 0.00–0.07)
Basophils Absolute: 0 10*3/uL (ref 0.0–0.1)
Basophils Relative: 1 %
Eosinophils Absolute: 0.1 10*3/uL (ref 0.0–0.5)
Eosinophils Relative: 3 %
HCT: 32.8 % — ABNORMAL LOW (ref 36.0–46.0)
Hemoglobin: 11 g/dL — ABNORMAL LOW (ref 12.0–15.0)
Immature Granulocytes: 0 %
Lymphocytes Relative: 27 %
Lymphs Abs: 1.1 10*3/uL (ref 0.7–4.0)
MCH: 30.1 pg (ref 26.0–34.0)
MCHC: 33.5 g/dL (ref 30.0–36.0)
MCV: 89.9 fL (ref 80.0–100.0)
Monocytes Absolute: 0.5 10*3/uL (ref 0.1–1.0)
Monocytes Relative: 11 %
Neutro Abs: 2.5 10*3/uL (ref 1.7–7.7)
Neutrophils Relative %: 58 %
Platelets: 207 10*3/uL (ref 150–400)
RBC: 3.65 MIL/uL — ABNORMAL LOW (ref 3.87–5.11)
RDW: 14.4 % (ref 11.5–15.5)
WBC: 4.2 10*3/uL (ref 4.0–10.5)
nRBC: 0 % (ref 0.0–0.2)

## 2022-01-30 IMAGING — CT CT HEAD W/O CM
3 series · 14 of 47 positions shown, 16 images · non-contrast
Comparison: None.

CLINICAL DATA: Fall



[Series 2: head 5.0 h30s · axial · 0.45mm/px · z∈[+682,+822]mm · 8 of 34 slices shown, 10 images]
[im 3/34  brain]
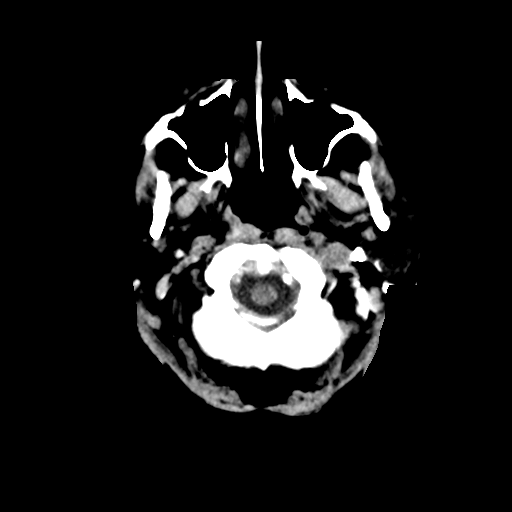
[im 3/34  bone]
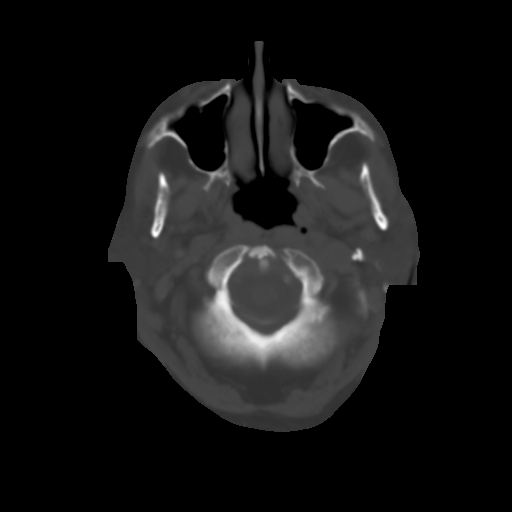
[im 7/34  brain]
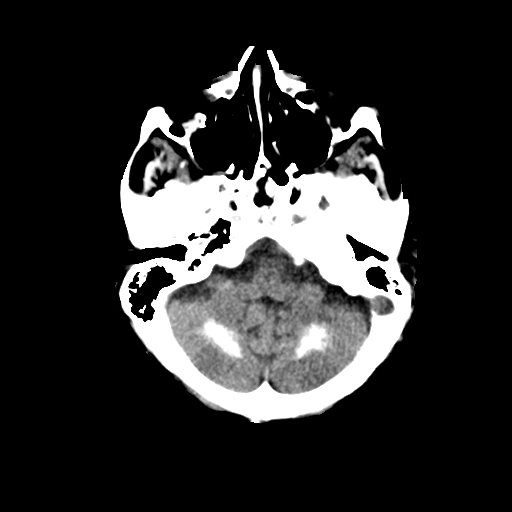
[im 11/34  brain]
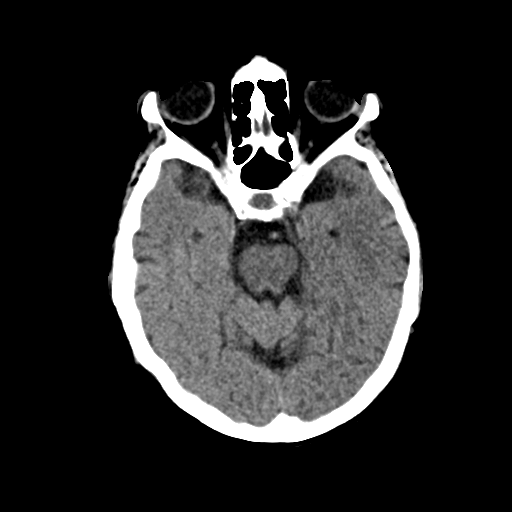
[im 15/34  brain]
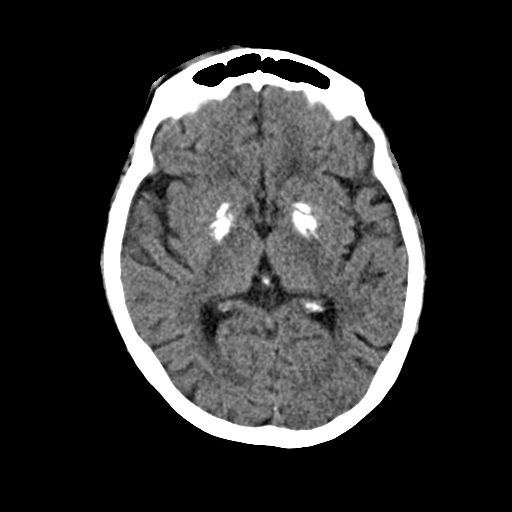
[im 19/34  brain]
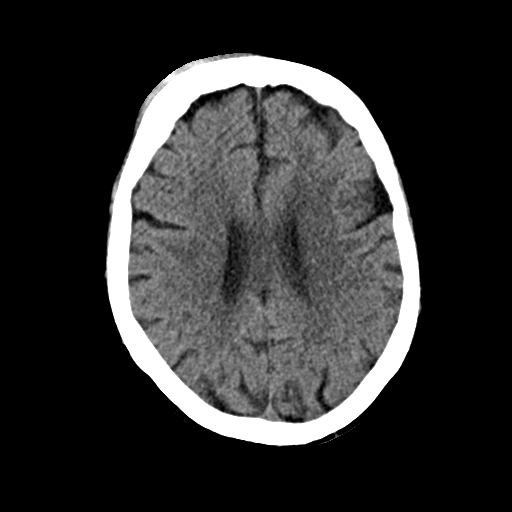
[im 19/34  bone]
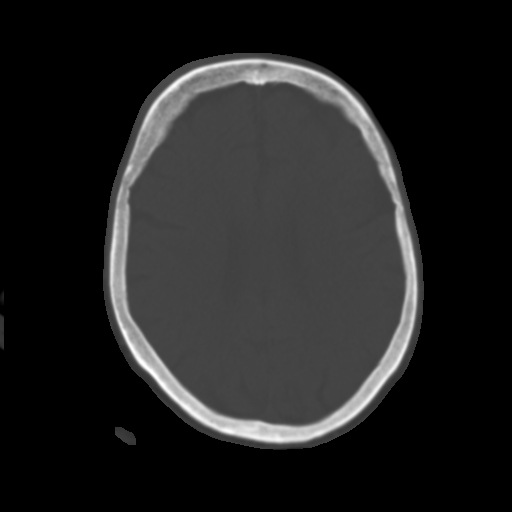
[im 23/34  brain]
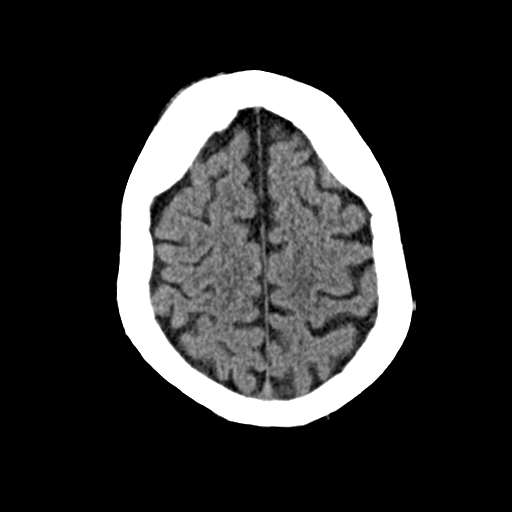
[im 27/34  brain]
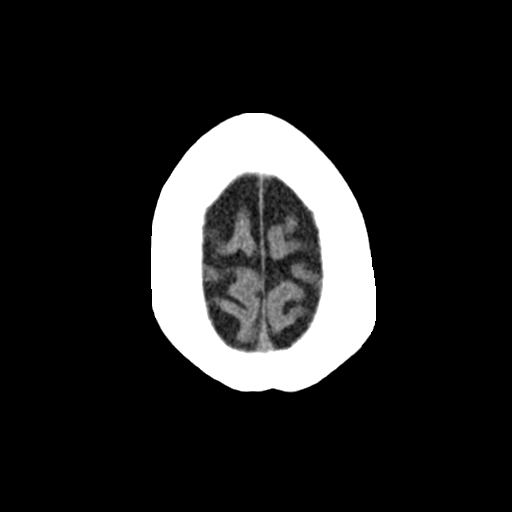
[im 31/34  brain]
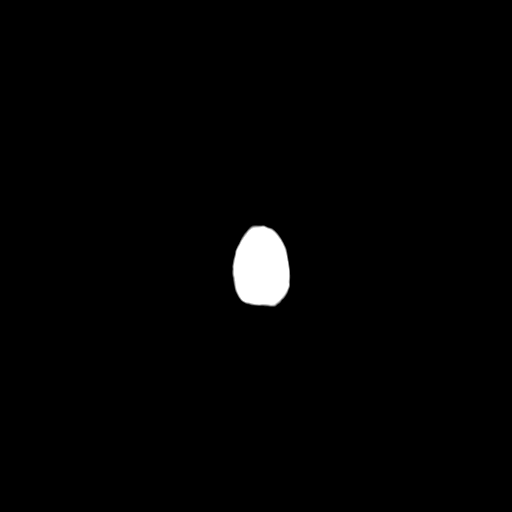

[Series 4: head 3.0 mpr cor · coronal · 0.33mm/px · 3 of 71 slices shown]
[im 24/71  brain]
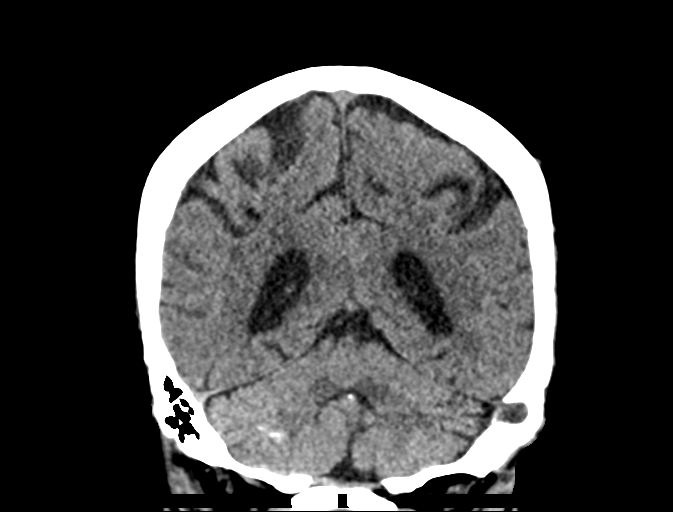
[im 32/71  brain]
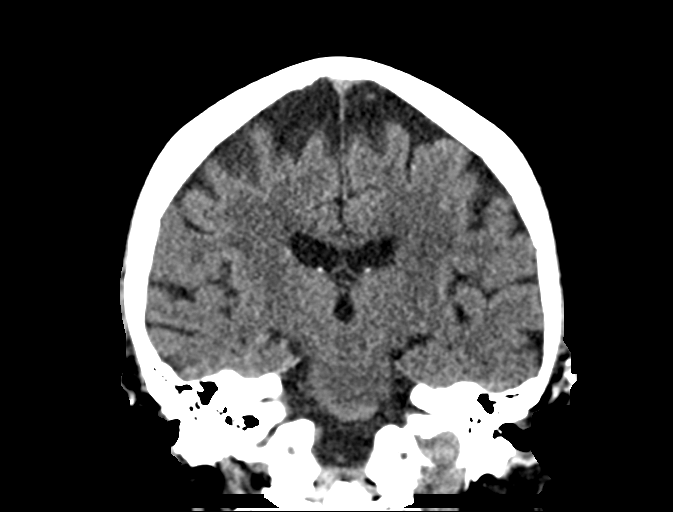
[im 39/71  brain]
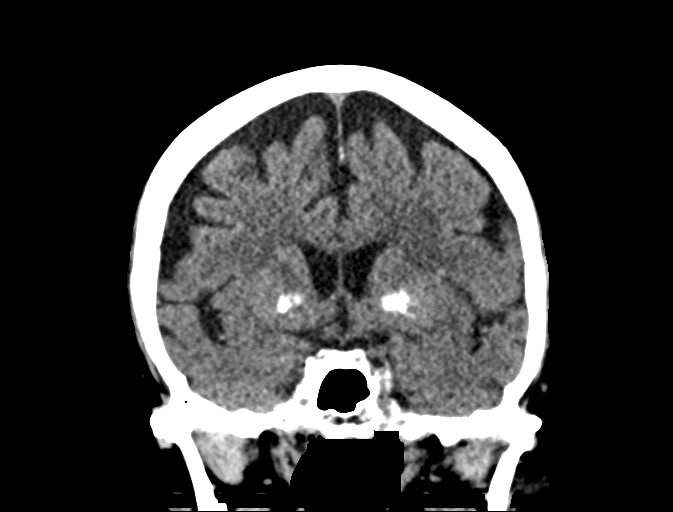

[Series 5: head 3.0 mpr sag · sagittal · 0.33mm/px · 3 of 67 slices shown]
[im 23/67  brain]
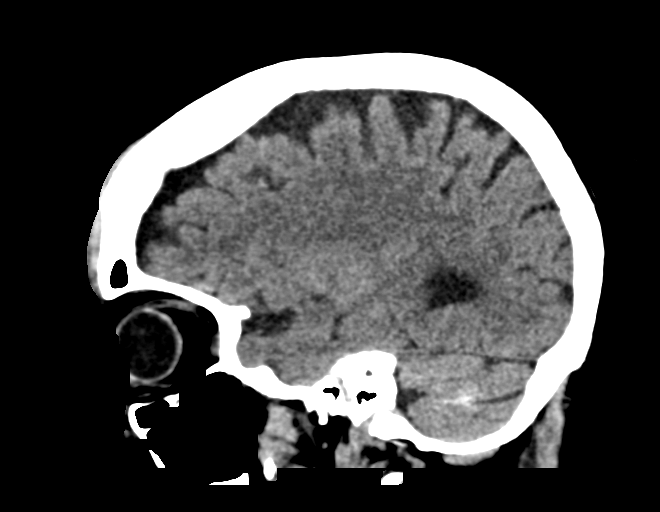
[im 34/67  brain]
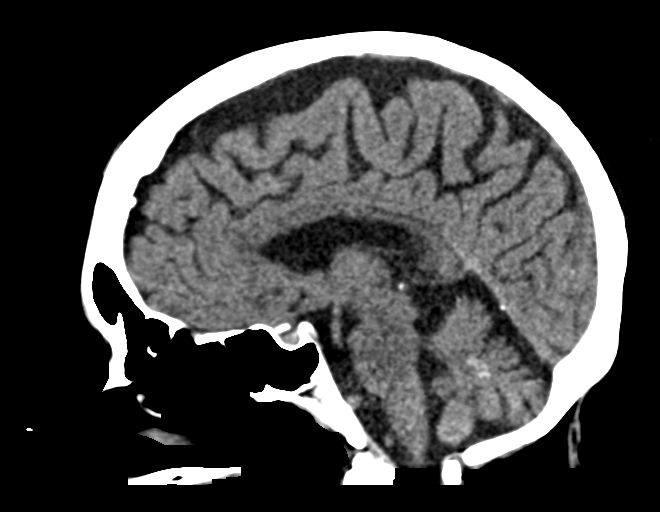
[im 45/67  brain]
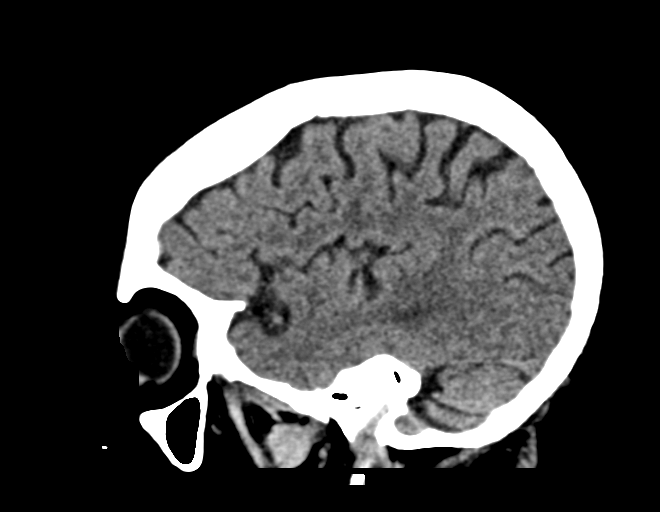

[14 of 47 positions shown; findings below may reference images not displayed]

FINDINGS: CT HEAD FINDINGS

Brain: There is no mass, hemorrhage or extra-axial collection. The
size and configuration of the ventricles and extra-axial CSF spaces
are normal. Dense mineralization in the basal ganglia and cerebellar
hemispheres.

Vascular: No abnormal hyperdensity of the major intracranial
arteries or dural venous sinuses. No intracranial atherosclerosis.

Skull: Small right frontal scalp hematoma.  No skull fracture.

Sinuses/Orbits: No fluid levels or advanced mucosal thickening of
the visualized paranasal sinuses. No mastoid or middle ear effusion.
The orbits are normal.

CT CERVICAL SPINE FINDINGS

Alignment: No static subluxation. Facets are aligned. Occipital
condyles are normally positioned.

Skull base and vertebrae: No acute fracture.

Soft tissues and spinal canal: No prevertebral fluid or swelling. No
visible canal hematoma.

Disc levels: No advanced spinal canal or neural foraminal stenosis.

Upper chest: No pneumothorax, pulmonary nodule or pleural effusion.

Other: Normal visualized paraspinal cervical soft tissues.
IMPRESSION: 1. No acute intracranial abnormality.
2. Small right frontal scalp hematoma without skull fracture.
3. No acute fracture or static subluxation of the cervical spine.

## 2022-01-30 IMAGING — CT CT CERVICAL SPINE W/O CM
3 of 4 series · 10 of 33 positions shown, 12 images · non-contrast
Comparison: None.

CLINICAL DATA: Fall



[Series 5: coronals · coronal · 0.23mm/px · 3 of 61 slices shown]
[im 13/61  bone]
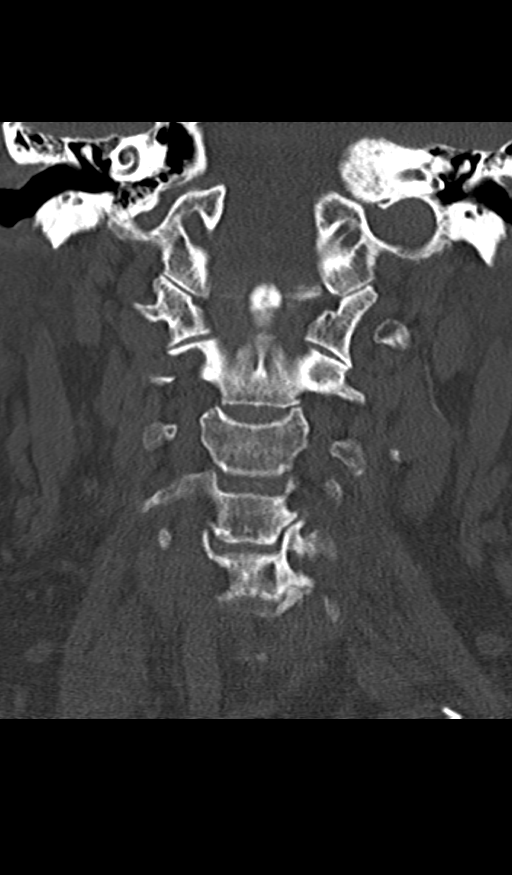
[im 25/61  bone]
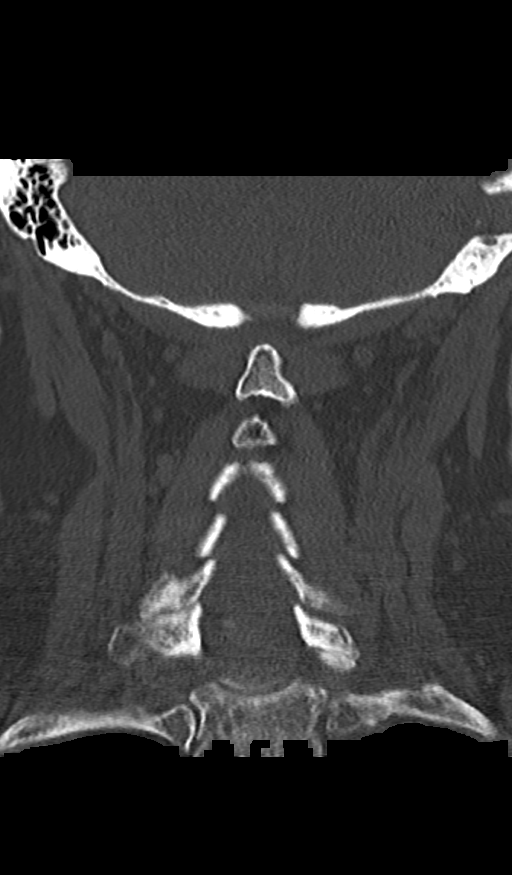
[im 37/61  bone]
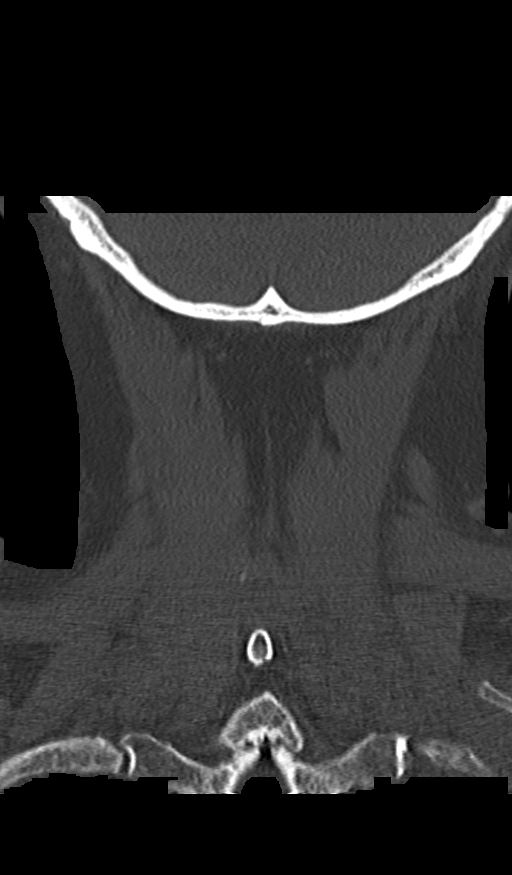

[Series 6: sagittals · sagittal · 0.23mm/px · 5 of 61 slices shown, 6 images]
[im 21/61  bone]
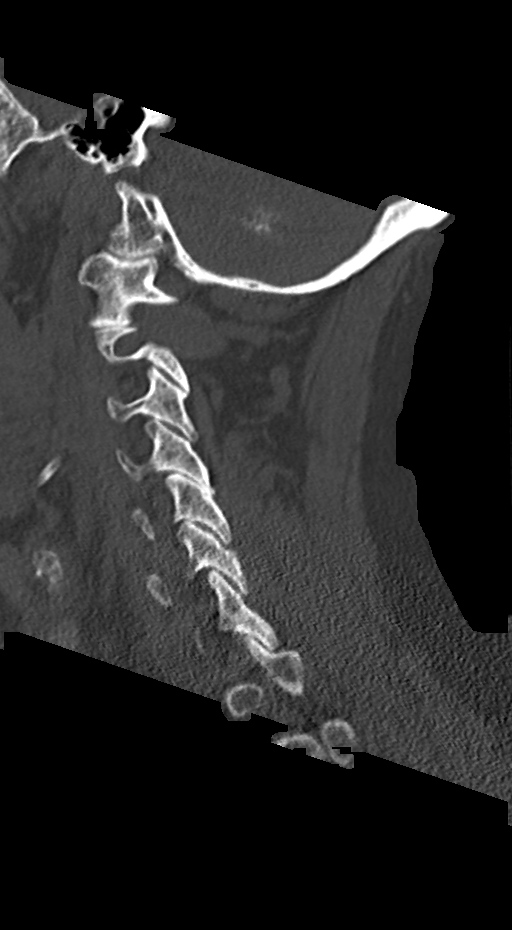
[im 26/61  bone]
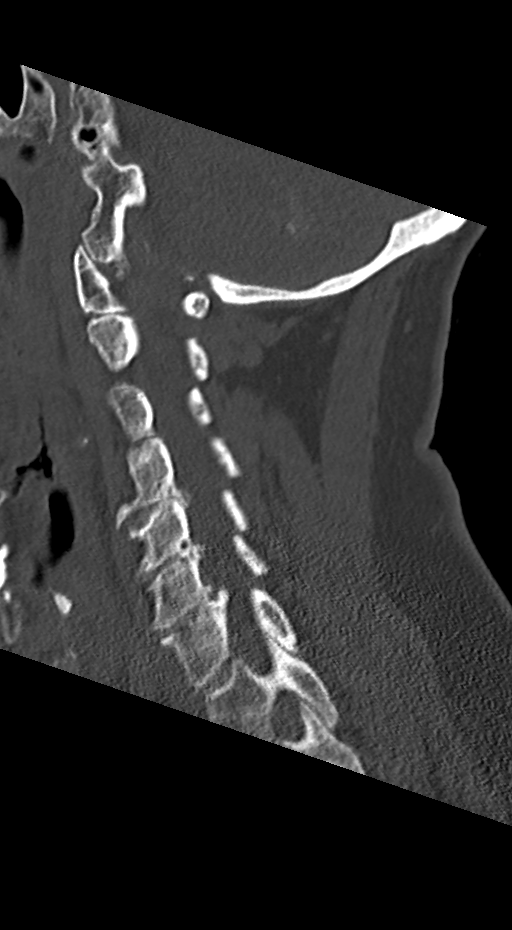
[im 31/61  soft-tissue]
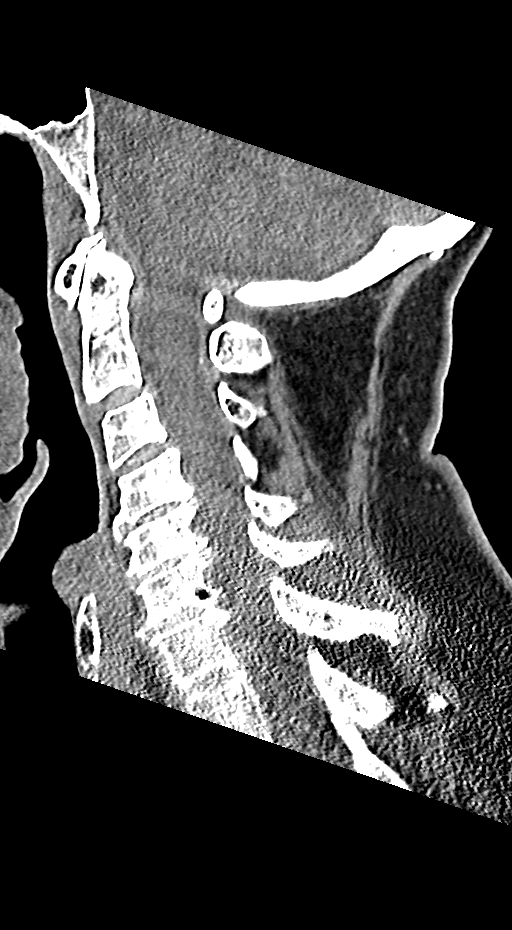
[im 31/61  bone]
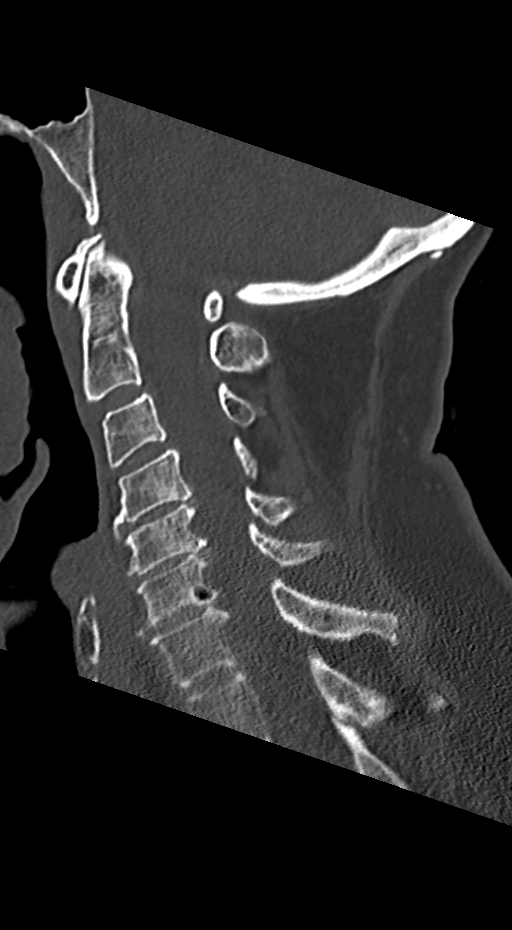
[im 36/61  bone]
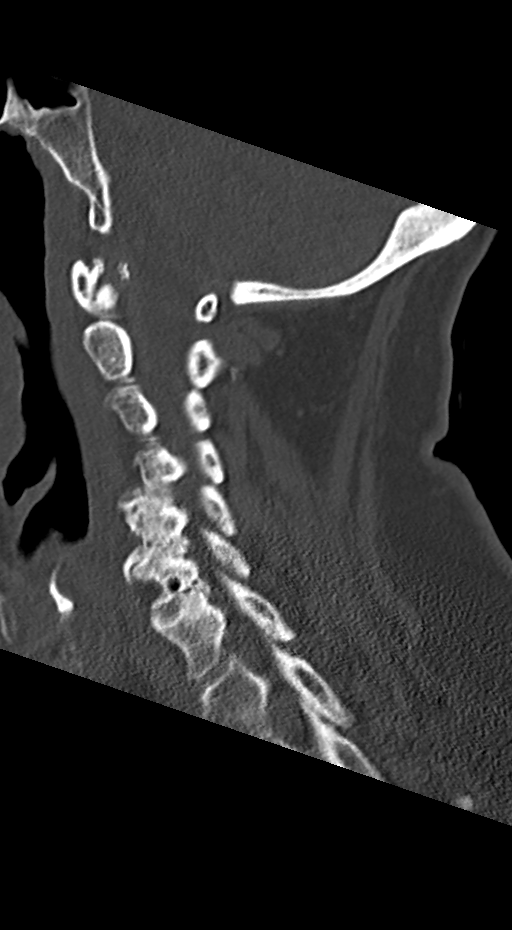
[im 41/61  bone]
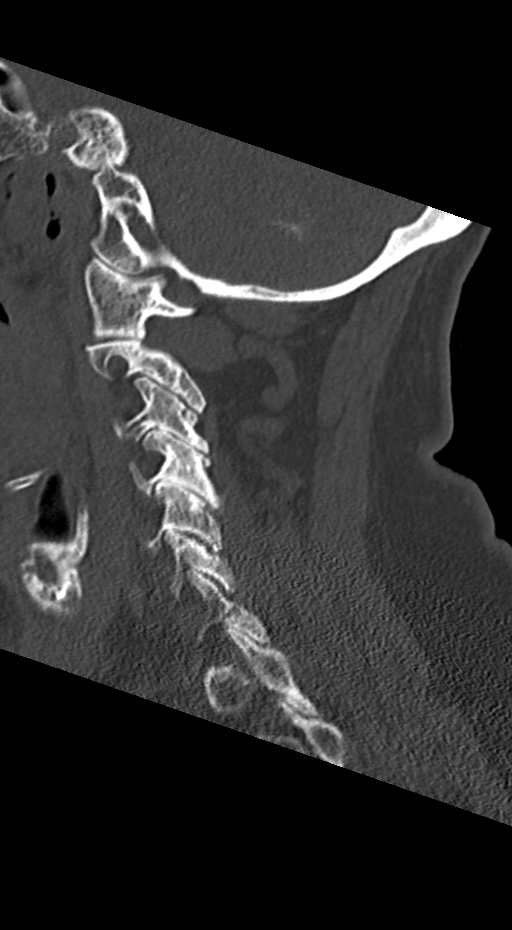

[Series 7: orthogonals · axial · 0.23mm/px · z∈[+592,+665]mm · 2 of 96 slices shown, 3 images]
[im 28/96  soft-tissue]
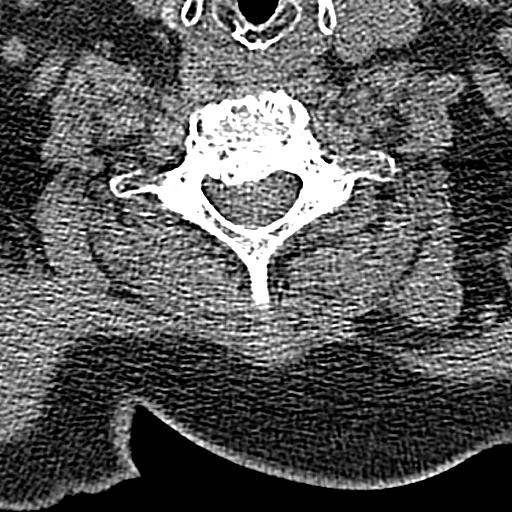
[im 28/96  bone]
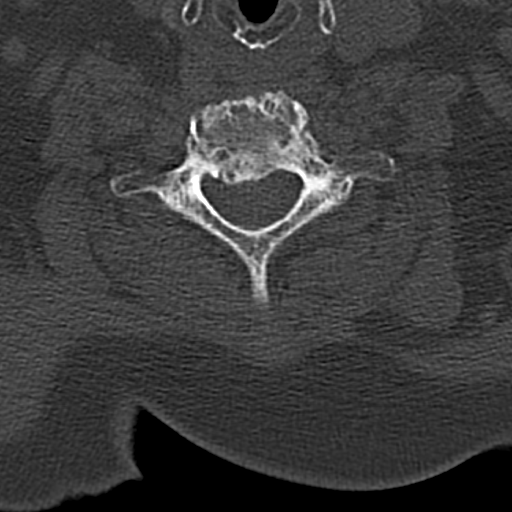
[im 68/96  bone]
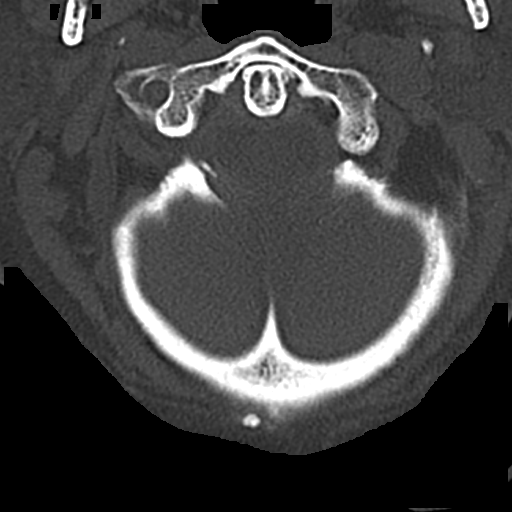

[10 of 33 positions shown; findings below may reference images not displayed]

FINDINGS: CT HEAD FINDINGS

Brain: There is no mass, hemorrhage or extra-axial collection. The
size and configuration of the ventricles and extra-axial CSF spaces
are normal. Dense mineralization in the basal ganglia and cerebellar
hemispheres.

Vascular: No abnormal hyperdensity of the major intracranial
arteries or dural venous sinuses. No intracranial atherosclerosis.

Skull: Small right frontal scalp hematoma.  No skull fracture.

Sinuses/Orbits: No fluid levels or advanced mucosal thickening of
the visualized paranasal sinuses. No mastoid or middle ear effusion.
The orbits are normal.

CT CERVICAL SPINE FINDINGS

Alignment: No static subluxation. Facets are aligned. Occipital
condyles are normally positioned.

Skull base and vertebrae: No acute fracture.

Soft tissues and spinal canal: No prevertebral fluid or swelling. No
visible canal hematoma.

Disc levels: No advanced spinal canal or neural foraminal stenosis.

Upper chest: No pneumothorax, pulmonary nodule or pleural effusion.

Other: Normal visualized paraspinal cervical soft tissues.
IMPRESSION: 1. No acute intracranial abnormality.
2. Small right frontal scalp hematoma without skull fracture.
3. No acute fracture or static subluxation of the cervical spine.

## 2022-01-30 IMAGING — CT CT T SPINE W/O CM
3 series · 9 of 33 positions shown, 11 images · non-contrast
Comparison: None.

CLINICAL DATA: Acute thoracic myelopathy.  Fall

EXAM:
CT THORACIC SPINE WITHOUT CONTRAST
TECHNIQUE: Multidetector CT images of the thoracic were obtained using the
standard protocol without intravenous contrast.
RADIATION DOSE REDUCTION: This exam was performed according to the
departmental dose-optimization program which includes automated
exposure control, adjustment of the mA and/or kV according to
patient size and/or use of iterative reconstruction technique.

[Series 4: t spine soft · axial · 0.34mm/px · z∈[+470,+470]mm · 1 of 171 slices shown, 2 images]
[im 92/171  soft-tissue]
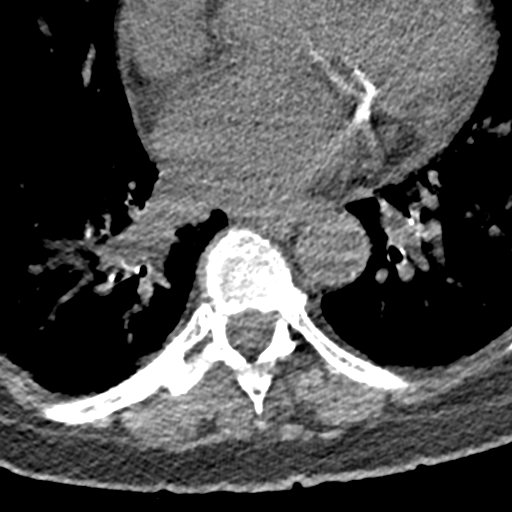
[im 92/171  bone]
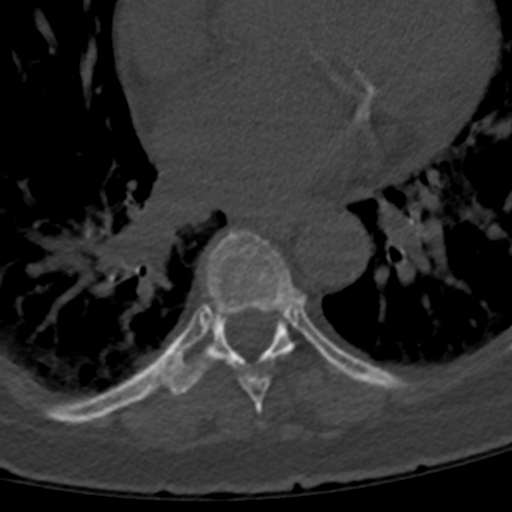

[Series 6: sagittal bone · sagittal · 0.37mm/px · 5 of 70 slices shown, 6 images]
[im 24/70  bone]
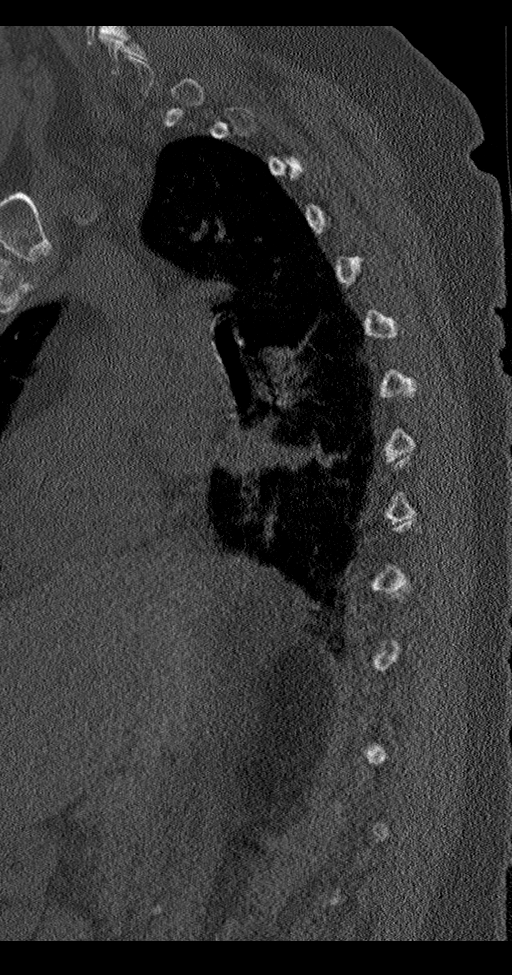
[im 29/70  bone]
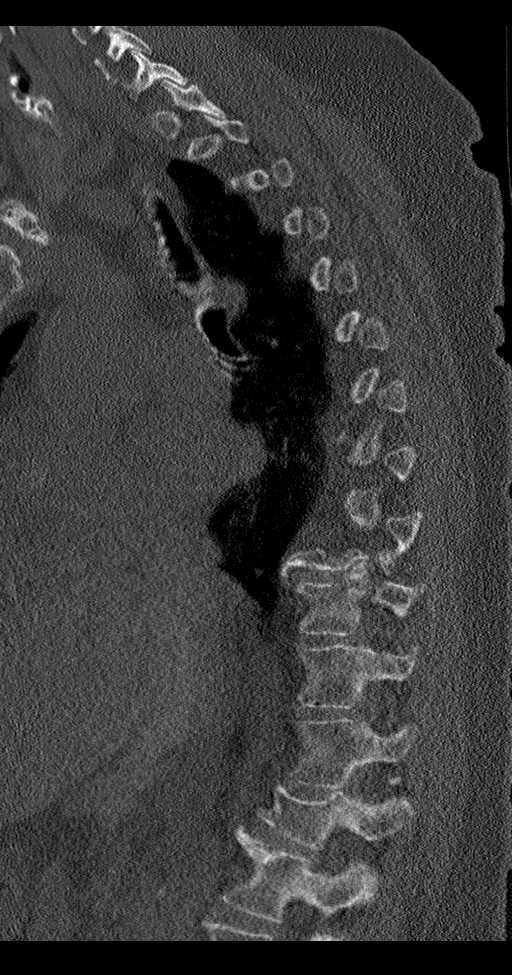
[im 35/70  soft-tissue]
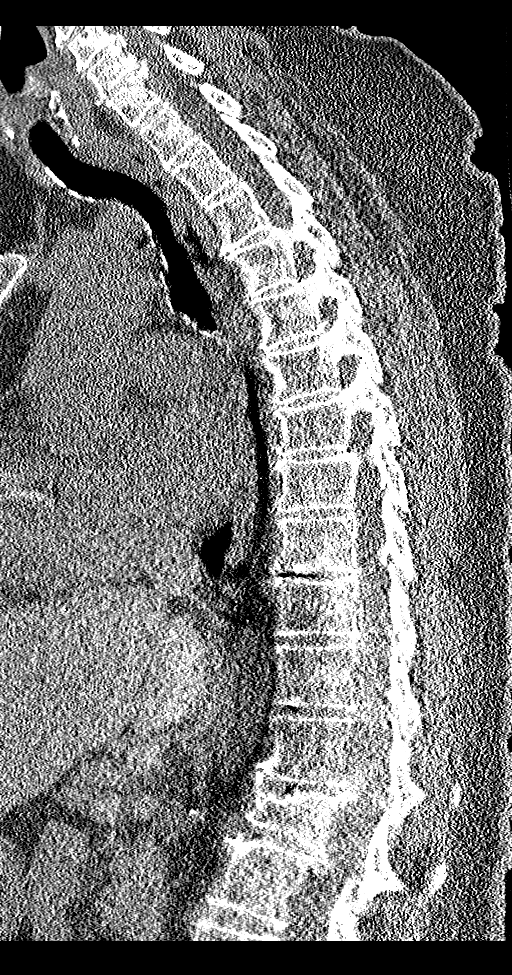
[im 35/70  bone]
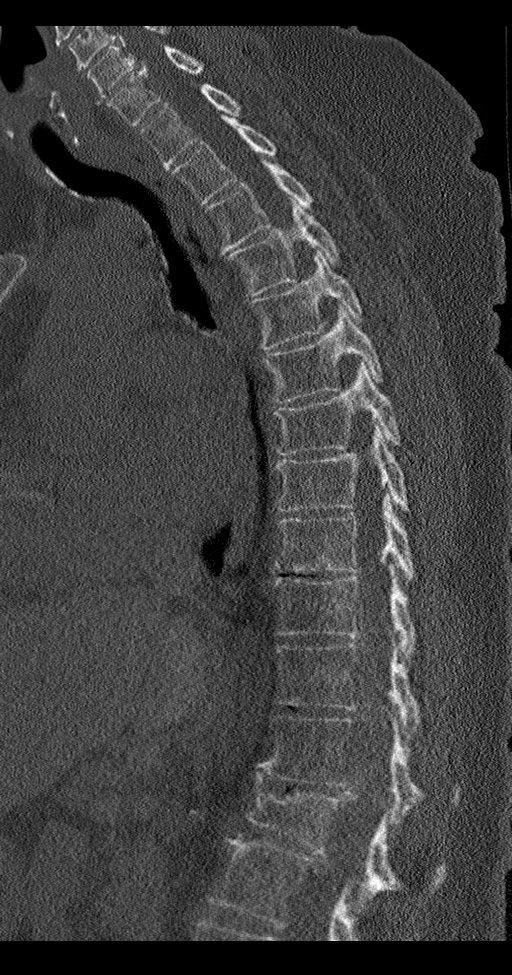
[im 41/70  bone]
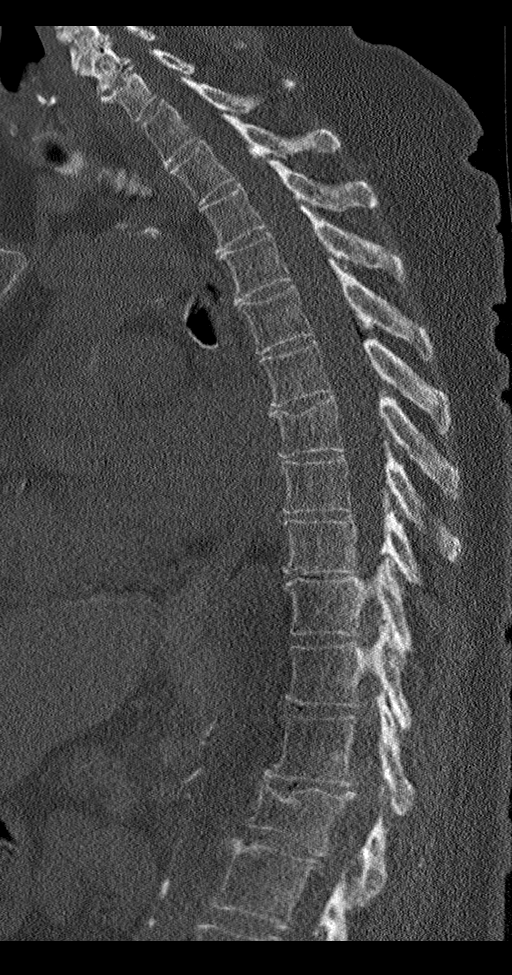
[im 47/70  bone]
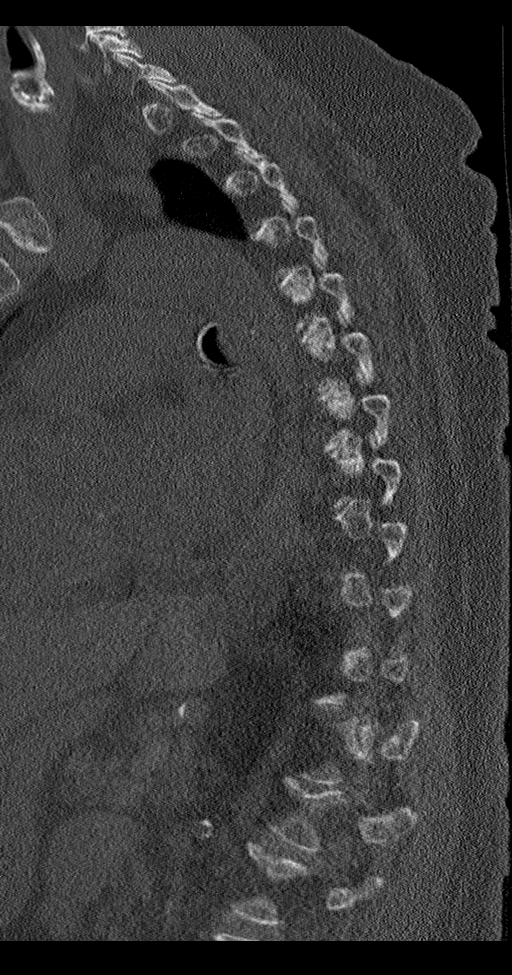

[Series 7: coronal bone · coronal · 0.32mm/px · 3 of 88 slices shown]
[im 18/88  bone]
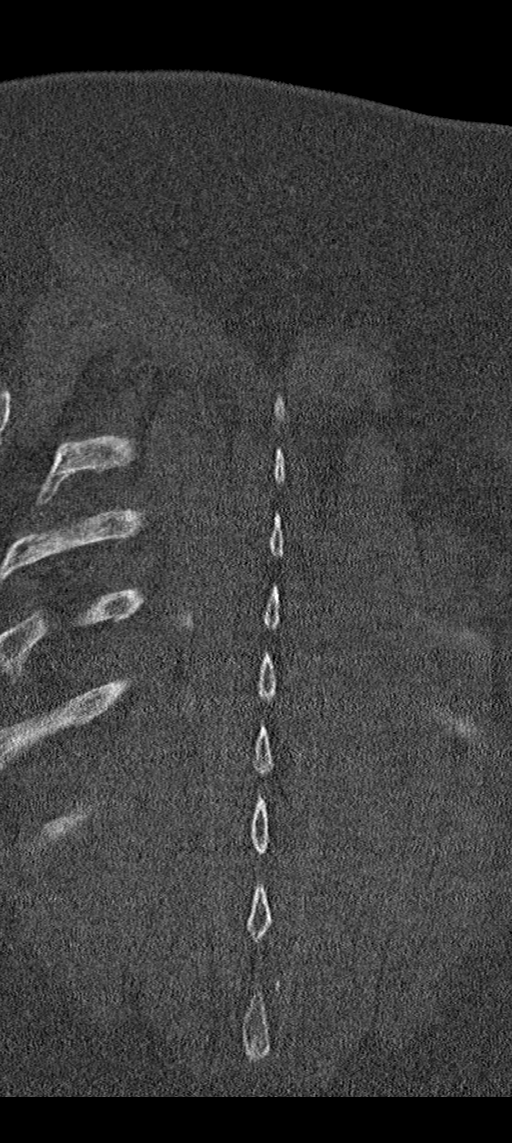
[im 35/88  bone]
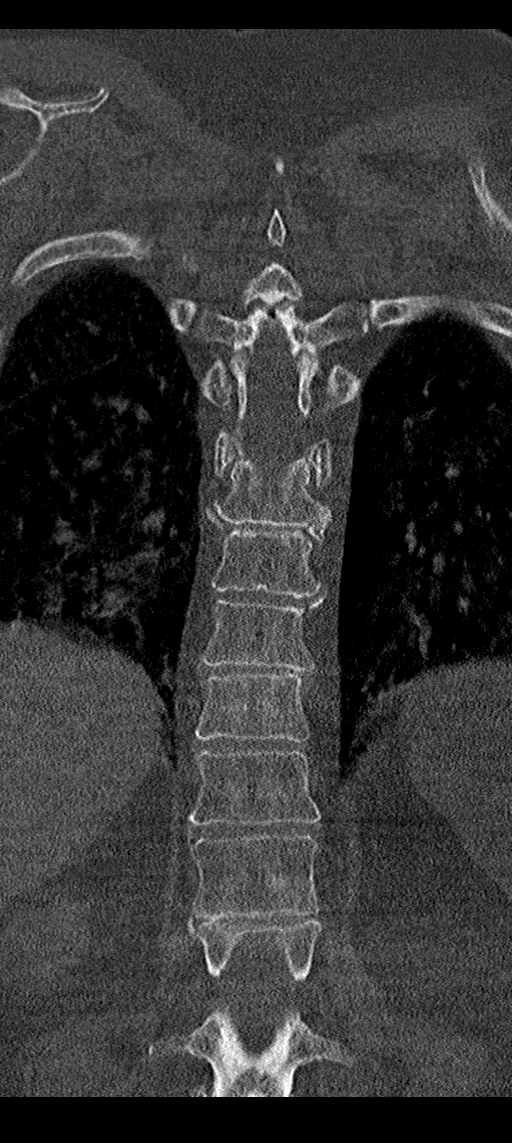
[im 53/88  bone]
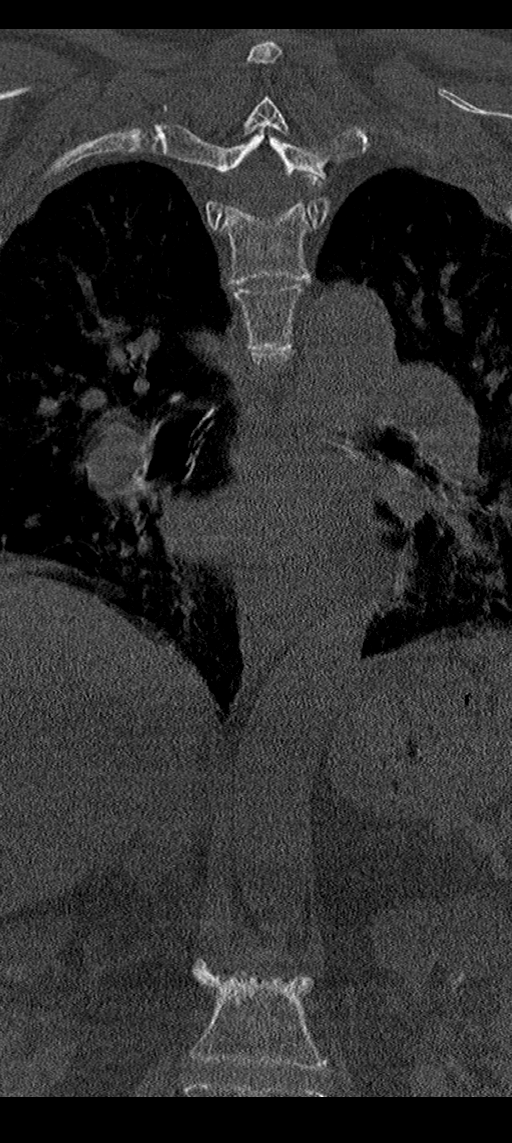

[9 of 33 positions shown; findings below may reference images not displayed]

FINDINGS: Alignment: Normal.

Vertebrae: There is chronic compression deformity of L1. No acute
vertebral abnormality.

Paraspinal and other soft tissues: Calcific aortic atherosclerosis.

Disc levels: There is no spinal canal stenosis. The neural foramina
are widely patent.
IMPRESSION: 1. No acute fracture or static subluxation of the thoracic spine.
2. Chronic compression deformity of L1.

Aortic Atherosclerosis ([VN]-[VN]).

## 2022-01-30 MED ORDER — MORPHINE SULFATE (PF) 4 MG/ML IV SOLN
4.0000 mg | Freq: Once | INTRAVENOUS | Status: AC
  Administered 2022-01-30: 4 mg via INTRAVENOUS
  Filled 2022-01-30: qty 1

## 2022-01-30 MED ORDER — TETANUS-DIPHTH-ACELL PERTUSSIS 5-2.5-18.5 LF-MCG/0.5 IM SUSY
0.5000 mL | PREFILLED_SYRINGE | Freq: Once | INTRAMUSCULAR | Status: AC
  Administered 2022-01-30: 0.5 mL via INTRAMUSCULAR
  Filled 2022-01-30: qty 0.5

## 2022-01-30 MED ORDER — LIDOCAINE-EPINEPHRINE (PF) 2 %-1:200000 IJ SOLN
10.0000 mL | Freq: Once | INTRAMUSCULAR | Status: AC
  Administered 2022-01-30: 10 mL via INTRADERMAL
  Filled 2022-01-30: qty 20

## 2022-01-30 MED ORDER — ONDANSETRON HCL 4 MG/2ML IJ SOLN
4.0000 mg | Freq: Once | INTRAMUSCULAR | Status: AC
  Administered 2022-01-30: 4 mg via INTRAVENOUS
  Filled 2022-01-30: qty 2

## 2022-01-30 MED ORDER — CYANOCOBALAMIN 1000 MCG/ML IJ SOLN
1000.0000 ug | Freq: Once | INTRAMUSCULAR | Status: AC
  Administered 2022-01-30: 1000 ug via INTRAMUSCULAR

## 2022-01-30 NOTE — ED Notes (Signed)
Report called to [facility], Agricultural consultant at [employer]

## 2022-01-30 NOTE — ED Provider Notes (Signed)
Patient transferred from [facility] for MRI.  She had a trip and fall over a vacuum cleaner cord and struck her head earlier today.  Imaging was negative for acute head or cervical spine injury but she has severe bilateral hand pain, plan to transfer for MRI to further evaluate.  MRI concerning for mild T7 compression fracture as well as moderate cervical stenosis.  She has good strength in her proximal muscle groups in the upper extremities, unable to grip with bilateral hands but she states it is due to severe pain to light touch and movement of the hands.  There are no significant overlying areas of ecchymosis.  She did have urinary retention in the emergency department.  She has good distal strength in the bilateral lower extremities.  Discussed with Dr. Kathyrn Sheriff with neurosurgery, recommends TLSO for back pain, will see the patient in consult.  Medicine consulted for admission for ongoing pain control.   Quintella Reichert, MD 01/31/22 [phone]

## 2022-01-30 NOTE — ED Notes (Signed)
C-collar applied

## 2022-01-30 NOTE — ED Notes (Signed)
Patient transported to CT 

## 2022-01-30 NOTE — Discharge Instructions (Signed)
Your sutures should dissolve.  If they are still there is 7 days out then you can gently pluck them out with tweezers or see your family doctor about it.  Please return for redness swelling or drainage.

## 2022-01-30 NOTE — ED Notes (Signed)
Report called to Jinny Sanders, RN with [facility]

## 2022-01-30 NOTE — Progress Notes (Signed)
Per orders of Alyssa Allwadt PA-C, injection of B-12 given by Tobe Sos in right deltoid. Patient tolerated injection well. Patient will make appointment for 1 month.

## 2022-01-30 NOTE — ED Triage Notes (Addendum)
Pt in via [facility] after trip fall at home, 1 in lac and hematoma to R forehead. No thinners, no LOC. She does endorse neck tenderness and pain to wrist and hand. Bleeding controlled, VSS and GCS 15. Given 13mcg Fentanyl en route

## 2022-01-30 NOTE — ED Triage Notes (Signed)
Tx from [facility] for MRI s/p fall. C/O neck pain and bilateral distal arm pain. Arrives in collar that placed at [facility].

## 2022-01-30 NOTE — ED Provider Notes (Signed)
[facility] EMERGENCY DEPARTMENT Provider Note   CSN: [id number] Arrival date & time: 01/30/22  1928     History  Chief Complaint  Patient presents with   Fall   Head Injury    [name] is a 71 y.o. female.  71 yo F with a chief complaints of a fall.  The patient tells me that she tripped over the cord to the vacuum cleaner and struck her head against the wall.  Was complaining of headache neck pain and pain to both of her arms.  The pain to the arms is diffuse and she has trouble localizing it.  Seems to hurt everywhere.  She denies any blood thinner use.  Has had some confusion afterwards.   Fall  Head Injury     Home Medications Prior to Admission medications   Not on File      Allergies    Patient has no allergy information on record.    Review of Systems   Review of Systems  Physical Exam Updated Vital Signs BP (!) 149/79    Pulse 91    Temp 98.3 F (36.8 C) (Oral)    Resp 19    Wt 81.6 kg    SpO2 98%  Physical Exam Vitals and nursing note reviewed.  Constitutional:      General: She is not in acute distress.    Appearance: She is well-developed. She is not diaphoretic.  HENT:     Head: Normocephalic.     Comments: Right frontal hematoma with a laceration to the apex.  No obvious facial injury. Eyes:     Pupils: Pupils are equal, round, and reactive to light.  Cardiovascular:     Rate and Rhythm: Normal rate and regular rhythm.     Heart sounds: No murmur heard.   No friction rub. No gallop.  Pulmonary:     Effort: Pulmonary effort is normal.     Breath sounds: No wheezing or rales.  Abdominal:     General: There is no distension.     Palpations: Abdomen is soft.     Tenderness: There is no abdominal tenderness.  Musculoskeletal:        General: Tenderness present.     Cervical back: Normal range of motion and neck supple.     Comments: Tenderness to the lower C-spine and the upper T-spine.  No obvious step-offs or  deformities.  Diffuse pain out of proportion to bilateral upper extremities.  No instability of the pelvis.  No pain with internal and external rotation of the lower extremities.  No obvious long bone tenderness.  Skin:    General: Skin is warm and dry.  Neurological:     Mental Status: She is alert and oriented to person, place, and time.  Psychiatric:        Behavior: Behavior normal.    ED Results / Procedures / Treatments   Labs (all labs ordered are listed, but only abnormal results are displayed) Labs Reviewed  CBC WITH DIFFERENTIAL/PLATELET - Abnormal; Notable for the following components:      Result Value   RBC 3.65 (*)    Hemoglobin 11.0 (*)    HCT 32.8 (*)    All other components within normal limits  BASIC METABOLIC PANEL - Abnormal; Notable for the following components:   Glucose, Bld 214 (*)    Creatinine, Ser 1.22 (*)    Calcium 8.6 (*)    GFR, Estimated 47 (*)    All other components  within normal limits  RESP PANEL BY RT-PCR (FLU A&B, COVID) [id number]    EKG None  Radiology CT Thoracic Spine Wo Contrast  Result Date: 01/30/2022 CLINICAL DATA:  Acute thoracic myelopathy.  Fall EXAM: CT THORACIC SPINE WITHOUT CONTRAST TECHNIQUE: Multidetector CT images of the thoracic were obtained using the standard protocol without intravenous contrast. RADIATION DOSE REDUCTION: This exam was performed according to the departmental dose-optimization program which includes automated exposure control, adjustment of the mA and/or kV according to patient size and/or use of iterative reconstruction technique. COMPARISON:  None. FINDINGS: Alignment: Normal. Vertebrae: There is chronic compression deformity of L1. No acute vertebral abnormality. Paraspinal and other soft tissues: Calcific aortic atherosclerosis. Disc levels: There is no spinal canal stenosis. The neural foramina are widely patent. IMPRESSION: 1. No acute fracture or static subluxation of the thoracic spine. 2. Chronic  compression deformity of L1. Aortic Atherosclerosis (ICD10-I70.0). Electronically Signed   By: Ulyses Jarred M.D.   On: 01/30/2022 20:36    Procedures .Marland KitchenLaceration Repair  Date/Time: 01/30/2022 9:01 PM Performed by: Deno Etienne, DO Authorized by: Deno Etienne, DO   Consent:    Consent obtained:  Verbal   Consent given by:  Patient   Risks, benefits, and alternatives were discussed: yes     Risks discussed:  Infection, pain, poor cosmetic result and poor wound healing   Alternatives discussed:  No treatment, delayed treatment and observation Universal protocol:    Procedure explained and questions answered to patient or proxy's satisfaction: yes     Immediately prior to procedure, a time out was called: yes     Patient identity confirmed:  Verbally with patient Anesthesia:    Anesthesia method:  Local infiltration   Local anesthetic:  Lidocaine 2% WITH epi Laceration details:    Location:  Face   Face location:  Forehead   Length (cm):  2.6 Exploration:    Limited defect created (wound extended): no     Hemostasis achieved with:  Epinephrine and direct pressure   Imaging obtained comment:  CT   Imaging outcome: foreign body not noted     Wound exploration: entire depth of wound visualized     Wound extent: no underlying fracture noted     Contaminated: no   Treatment:    Area cleansed with:  Saline   Amount of cleaning:  Extensive   Irrigation solution:  Sterile saline   Irrigation volume:  50   Irrigation method:  Pressure wash   Visualized foreign bodies/material removed: no     Debridement:  None   Undermining:  None   Scar revision: no   Skin repair:    Repair method:  Sutures   Suture size:  5-0   Suture material:  Fast-absorbing gut   Suture technique:  Simple interrupted   Number of sutures:  2 Approximation:    Approximation:  Close Repair type:    Repair type:  Simple Post-procedure details:    Dressing:  Adhesive bandage and antibiotic ointment   Procedure  completion:  Tolerated well, no immediate complications    Medications Ordered in ED Medications  Tdap (BOOSTRIX) injection 0.5 mL (0.5 mLs Intramuscular Given 01/30/22 1958)  morphine (PF) 4 MG/ML injection 4 mg (4 mg Intravenous Given 01/30/22 1959)  ondansetron (ZOFRAN) injection 4 mg (4 mg Intravenous Given 01/30/22 1959)  lidocaine-EPINEPHrine (XYLOCAINE W/EPI) 2 %-1:200000 (PF) injection 10 mL (10 mLs Intradermal Given by Other 01/30/22 2056)  morphine (PF) 4 MG/ML injection 4 mg (4 mg Intravenous Given  01/30/22 2112)    ED Course/ Medical Decision Making/ A&P                           Medical Decision Making Amount and/or Complexity of Data Reviewed Labs: ordered. Radiology: ordered. ECG/medicine tests: ordered.  Risk Prescription drug management.   Patient is a 71 y.o. female with a cc of a fall.  This happened while she was at home.  Nonsyncopal by history.  Complaining of pain to the head the neck and both of her arms.  Very concerning for central cord syndrome with pain at proportion to both upper extremities.  Will obtain a CT of the head C-spine and T-spine.  Blood work.  Injury pain.  Reassess. I independently interpreted the patients labs and imaging no significant anemia no significant electrolyte abnormality renal function appears to be at baseline.  CT of the head C and T-spine without obvious traumatic injury.  She on reassessment is still very symptomatic with burning pain to both of her hands now going slightly of the arms.  I will discuss the case with neurosurgery.  I discussed the case with Dr. Kathyrn Sheriff and felt it reasonable to have her sent over to [facility] for MRI and then reassessment.  I discussed this with Dr. Alvino Chapel accepts in transfer.  CRITICAL CARE Performed by: Cecilio Asper   Total critical care time: 35 minutes  Critical care time was exclusive of separately billable procedures and treating other patients.  Critical care was necessary to  treat or prevent imminent or life-threatening deterioration.  Critical care was time spent personally by me on the following activities: development of treatment plan with patient and/or surrogate as well as nursing, discussions with consultants, evaluation of patient's response to treatment, examination of patient, obtaining history from patient or surrogate, ordering and performing treatments and interventions, ordering and review of laboratory studies, ordering and review of radiographic studies, pulse oximetry and re-evaluation of patient's condition.    The patients results and plan were reviewed and discussed.   Any x-rays performed were independently reviewed by myself.   Differential diagnosis were considered with the presenting HPI.  Medications  Tdap (BOOSTRIX) injection 0.5 mL (0.5 mLs Intramuscular Given 01/30/22 1958)  morphine (PF) 4 MG/ML injection 4 mg (4 mg Intravenous Given 01/30/22 1959)  ondansetron (ZOFRAN) injection 4 mg (4 mg Intravenous Given 01/30/22 1959)  lidocaine-EPINEPHrine (XYLOCAINE W/EPI) 2 %-1:200000 (PF) injection 10 mL (10 mLs Intradermal Given by Other 01/30/22 2056)  morphine (PF) 4 MG/ML injection 4 mg (4 mg Intravenous Given 01/30/22 2112)    Vitals:   01/30/22 2100 01/30/22 2115 01/30/22 2130 01/30/22 2145  BP: (!) 162/79 (!) 154/73 (!) 151/78 (!) 149/79  Pulse: 93 93 91 91  Resp: (!) 21 (!) 25 17 19   Temp:      TempSrc:      SpO2: 100% 100% 98%   Weight:        Final diagnoses:  Central cord syndrome, initial encounter ([place])  Facial laceration, initial encounter           Final Clinical Impression(s) / ED Diagnoses Final diagnoses:  Central cord syndrome, initial encounter [facility])  Facial laceration, initial encounter    Rx / DC Orders ED Discharge Orders     None         Deno Etienne, DO 01/30/22 2155

## 2022-01-31 ENCOUNTER — Encounter (HOSPITAL_COMMUNITY): Payer: Self-pay | Admitting: Internal Medicine

## 2022-01-31 ENCOUNTER — Emergency Department (HOSPITAL_COMMUNITY): Payer: Medicare Other

## 2022-01-31 ENCOUNTER — Ambulatory Visit: Payer: Medicaid Other | Admitting: Podiatry

## 2022-01-31 ENCOUNTER — Encounter: Payer: Self-pay | Admitting: Internal Medicine

## 2022-01-31 DIAGNOSIS — F0390 Unspecified dementia without behavioral disturbance: Secondary | ICD-10-CM | POA: Diagnosis present

## 2022-01-31 DIAGNOSIS — E119 Type 2 diabetes mellitus without complications: Secondary | ICD-10-CM

## 2022-01-31 DIAGNOSIS — R296 Repeated falls: Secondary | ICD-10-CM

## 2022-01-31 DIAGNOSIS — S22069A Unspecified fracture of T7-T8 vertebra, initial encounter for closed fracture: Secondary | ICD-10-CM | POA: Diagnosis present

## 2022-01-31 DIAGNOSIS — I10 Essential (primary) hypertension: Secondary | ICD-10-CM | POA: Diagnosis present

## 2022-01-31 DIAGNOSIS — M79641 Pain in right hand: Secondary | ICD-10-CM | POA: Diagnosis present

## 2022-01-31 DIAGNOSIS — M4802 Spinal stenosis, cervical region: Secondary | ICD-10-CM | POA: Diagnosis present

## 2022-01-31 DIAGNOSIS — E039 Hypothyroidism, unspecified: Secondary | ICD-10-CM | POA: Diagnosis present

## 2022-01-31 DIAGNOSIS — E785 Hyperlipidemia, unspecified: Secondary | ICD-10-CM | POA: Diagnosis present

## 2022-01-31 DIAGNOSIS — M79642 Pain in left hand: Secondary | ICD-10-CM | POA: Diagnosis present

## 2022-01-31 DIAGNOSIS — K5900 Constipation, unspecified: Secondary | ICD-10-CM | POA: Diagnosis present

## 2022-01-31 DIAGNOSIS — S22069D Unspecified fracture of T7-T8 vertebra, subsequent encounter for fracture with routine healing: Secondary | ICD-10-CM | POA: Diagnosis present

## 2022-01-31 DIAGNOSIS — R42 Dizziness and giddiness: Secondary | ICD-10-CM | POA: Diagnosis present

## 2022-01-31 DIAGNOSIS — G4700 Insomnia, unspecified: Secondary | ICD-10-CM | POA: Diagnosis present

## 2022-01-31 DIAGNOSIS — S0181XD Laceration without foreign body of other part of head, subsequent encounter: Secondary | ICD-10-CM | POA: Diagnosis not present

## 2022-01-31 DIAGNOSIS — K219 Gastro-esophageal reflux disease without esophagitis: Secondary | ICD-10-CM | POA: Diagnosis present

## 2022-01-31 DIAGNOSIS — Z7982 Long term (current) use of aspirin: Secondary | ICD-10-CM | POA: Diagnosis not present

## 2022-01-31 DIAGNOSIS — Z794 Long term (current) use of insulin: Secondary | ICD-10-CM | POA: Diagnosis not present

## 2022-01-31 DIAGNOSIS — Z6831 Body mass index (BMI) 31.0-31.9, adult: Secondary | ICD-10-CM | POA: Diagnosis not present

## 2022-01-31 DIAGNOSIS — Z888 Allergy status to other drugs, medicaments and biological substances status: Secondary | ICD-10-CM | POA: Diagnosis not present

## 2022-01-31 DIAGNOSIS — W010XXA Fall on same level from slipping, tripping and stumbling without subsequent striking against object, initial encounter: Secondary | ICD-10-CM | POA: Diagnosis present

## 2022-01-31 DIAGNOSIS — W19XXXD Unspecified fall, subsequent encounter: Secondary | ICD-10-CM | POA: Diagnosis present

## 2022-01-31 DIAGNOSIS — N179 Acute kidney failure, unspecified: Secondary | ICD-10-CM | POA: Diagnosis present

## 2022-01-31 DIAGNOSIS — E1165 Type 2 diabetes mellitus with hyperglycemia: Secondary | ICD-10-CM | POA: Diagnosis present

## 2022-01-31 DIAGNOSIS — G43909 Migraine, unspecified, not intractable, without status migrainosus: Secondary | ICD-10-CM | POA: Diagnosis present

## 2022-01-31 DIAGNOSIS — S7291XA Unspecified fracture of right femur, initial encounter for closed fracture: Secondary | ICD-10-CM | POA: Diagnosis not present

## 2022-01-31 DIAGNOSIS — Y92009 Unspecified place in unspecified non-institutional (private) residence as the place of occurrence of the external cause: Secondary | ICD-10-CM | POA: Diagnosis not present

## 2022-01-31 DIAGNOSIS — I951 Orthostatic hypotension: Secondary | ICD-10-CM | POA: Diagnosis present

## 2022-01-31 DIAGNOSIS — S22069S Unspecified fracture of T7-T8 vertebra, sequela: Secondary | ICD-10-CM | POA: Diagnosis not present

## 2022-01-31 DIAGNOSIS — Z8249 Family history of ischemic heart disease and other diseases of the circulatory system: Secondary | ICD-10-CM | POA: Diagnosis not present

## 2022-01-31 DIAGNOSIS — S22068S Other fracture of T7-T8 thoracic vertebra, sequela: Secondary | ICD-10-CM | POA: Diagnosis not present

## 2022-01-31 DIAGNOSIS — E440 Moderate protein-calorie malnutrition: Secondary | ICD-10-CM | POA: Diagnosis present

## 2022-01-31 DIAGNOSIS — E669 Obesity, unspecified: Secondary | ICD-10-CM | POA: Diagnosis present

## 2022-01-31 DIAGNOSIS — Z882 Allergy status to sulfonamides status: Secondary | ICD-10-CM | POA: Diagnosis not present

## 2022-01-31 DIAGNOSIS — D510 Vitamin B12 deficiency anemia due to intrinsic factor deficiency: Secondary | ICD-10-CM | POA: Diagnosis present

## 2022-01-31 DIAGNOSIS — S0181XA Laceration without foreign body of other part of head, initial encounter: Secondary | ICD-10-CM | POA: Diagnosis present

## 2022-01-31 DIAGNOSIS — Z800 Family history of malignant neoplasm of digestive organs: Secondary | ICD-10-CM | POA: Diagnosis not present

## 2022-01-31 DIAGNOSIS — Z79899 Other long term (current) drug therapy: Secondary | ICD-10-CM | POA: Diagnosis not present

## 2022-01-31 DIAGNOSIS — S12600A Unspecified displaced fracture of seventh cervical vertebra, initial encounter for closed fracture: Secondary | ICD-10-CM | POA: Diagnosis present

## 2022-01-31 DIAGNOSIS — Z20822 Contact with and (suspected) exposure to covid-19: Secondary | ICD-10-CM | POA: Diagnosis present

## 2022-01-31 DIAGNOSIS — F32A Depression, unspecified: Secondary | ICD-10-CM | POA: Diagnosis present

## 2022-01-31 DIAGNOSIS — M8588 Other specified disorders of bone density and structure, other site: Secondary | ICD-10-CM | POA: Diagnosis present

## 2022-01-31 DIAGNOSIS — Z8542 Personal history of malignant neoplasm of other parts of uterus: Secondary | ICD-10-CM | POA: Diagnosis not present

## 2022-01-31 DIAGNOSIS — S7290XA Unspecified fracture of unspecified femur, initial encounter for closed fracture: Secondary | ICD-10-CM | POA: Diagnosis not present

## 2022-01-31 DIAGNOSIS — Z23 Encounter for immunization: Secondary | ICD-10-CM | POA: Diagnosis not present

## 2022-01-31 DIAGNOSIS — Z79890 Hormone replacement therapy (postmenopausal): Secondary | ICD-10-CM | POA: Diagnosis not present

## 2022-01-31 DIAGNOSIS — M4856XA Collapsed vertebra, not elsewhere classified, lumbar region, initial encounter for fracture: Secondary | ICD-10-CM | POA: Diagnosis present

## 2022-01-31 DIAGNOSIS — R251 Tremor, unspecified: Secondary | ICD-10-CM | POA: Diagnosis present

## 2022-01-31 DIAGNOSIS — M792 Neuralgia and neuritis, unspecified: Secondary | ICD-10-CM | POA: Diagnosis not present

## 2022-01-31 LAB — CBG MONITORING, ED
Glucose-Capillary: 162 mg/dL — ABNORMAL HIGH (ref 70–99)
Glucose-Capillary: 102 mg/dL — ABNORMAL HIGH (ref 70–99)
Glucose-Capillary: 179 mg/dL — ABNORMAL HIGH (ref 70–99)

## 2022-01-31 IMAGING — DX DG HAND COMPLETE 3+V*L*
4 series · 4 of 4 positions shown · non-contrast
Comparison: None.

CLINICAL DATA: 70-year-old female status post fall.

EXAM:
LEFT HAND - COMPLETE 3+ VIEW

[hand ap]
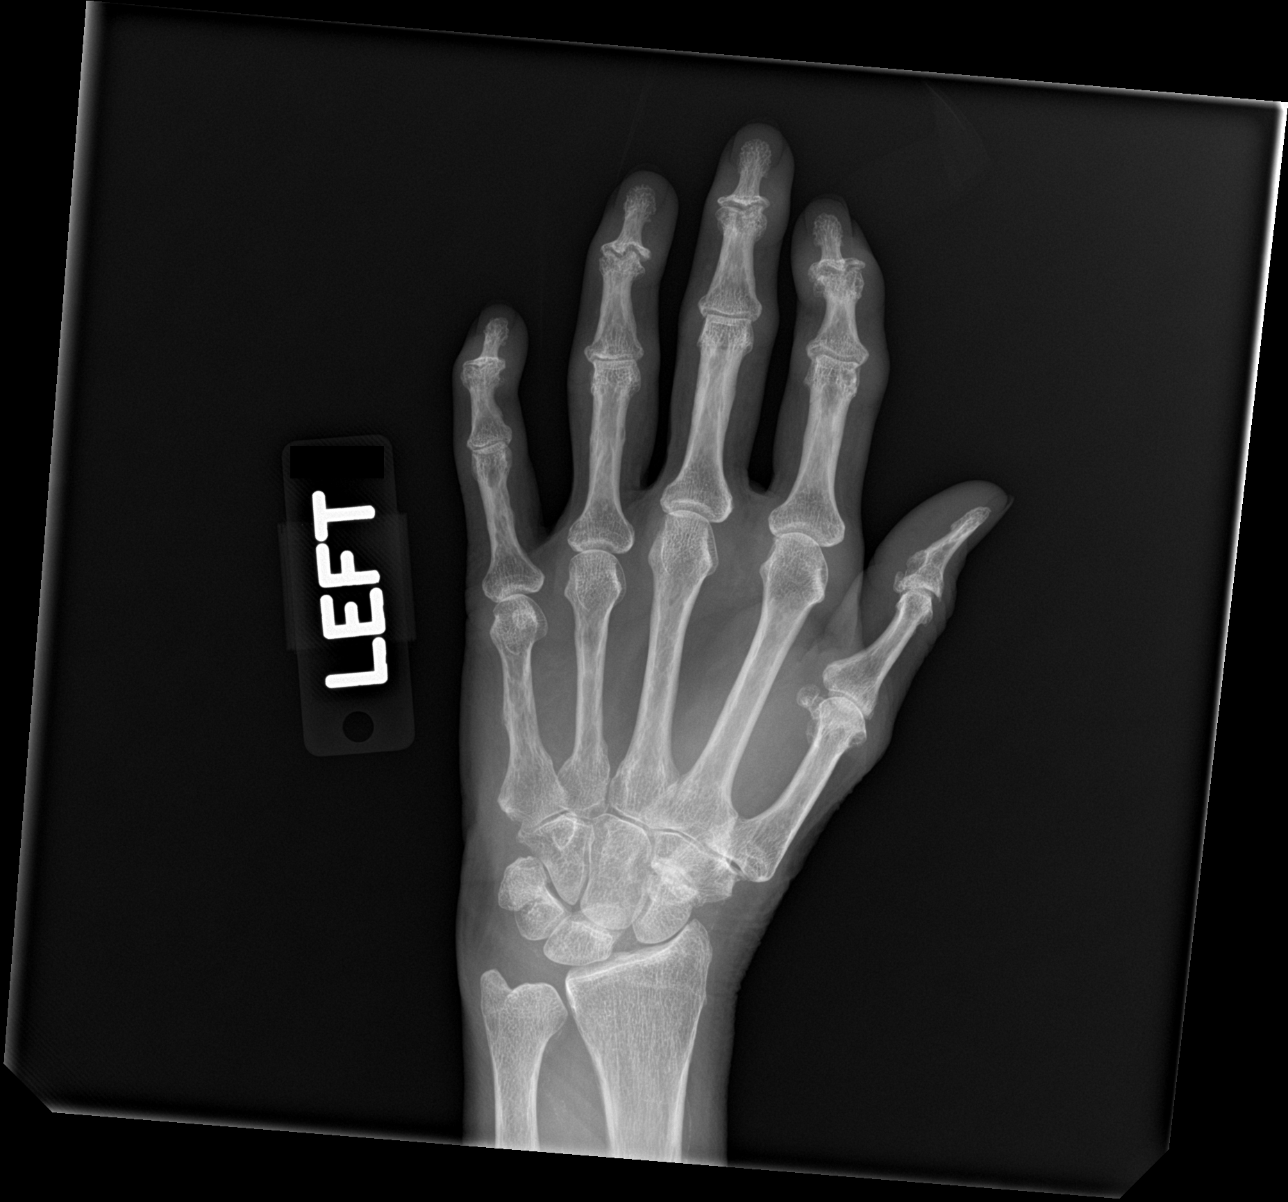

[hand obl]
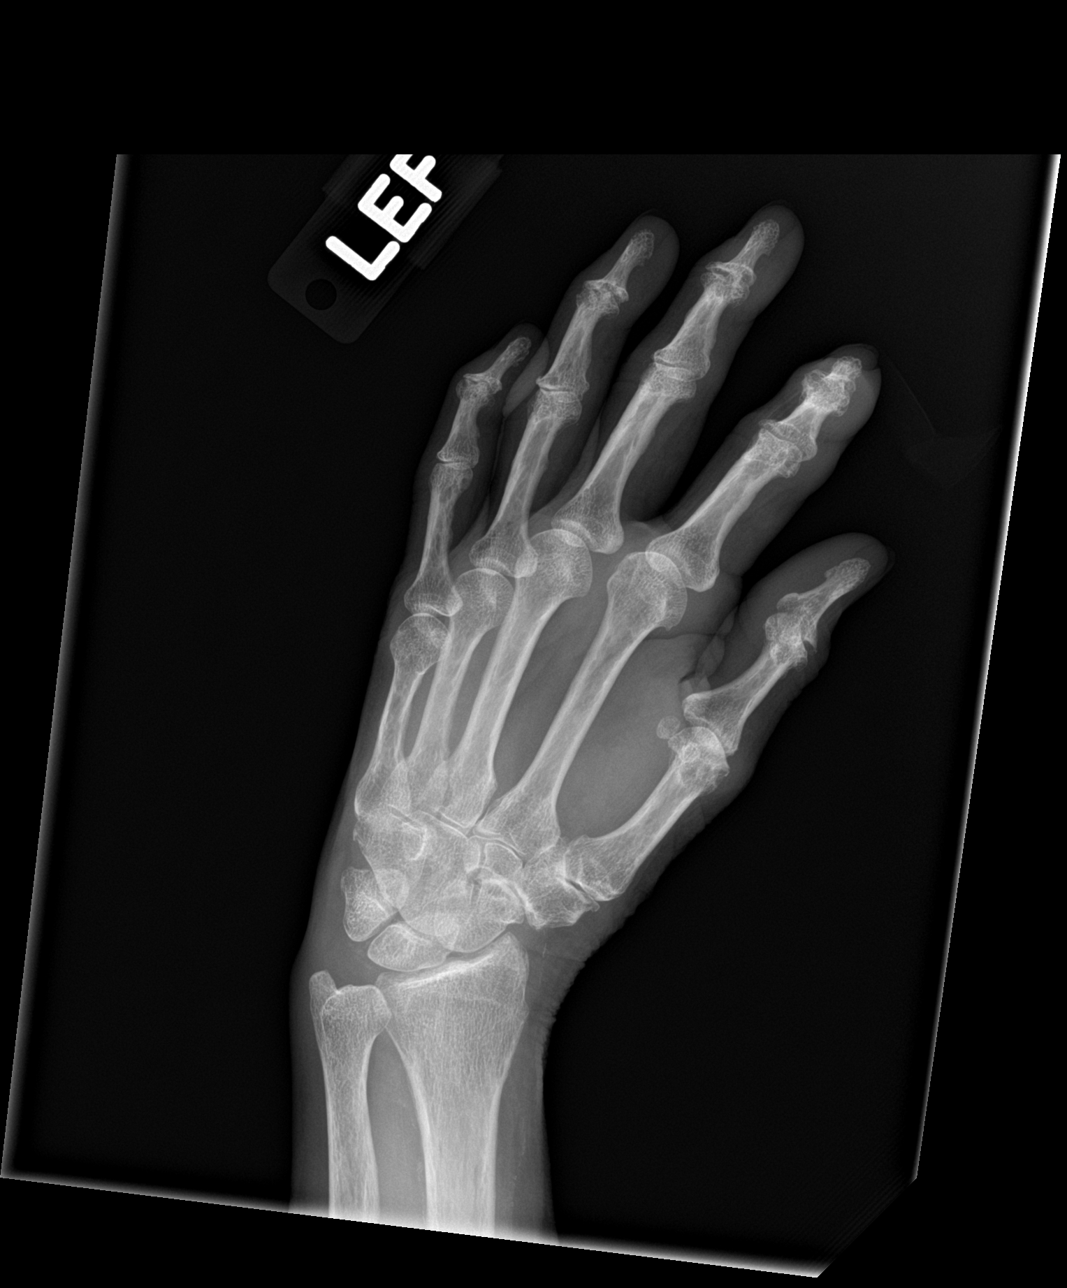

[hand lat (1 of 2)]
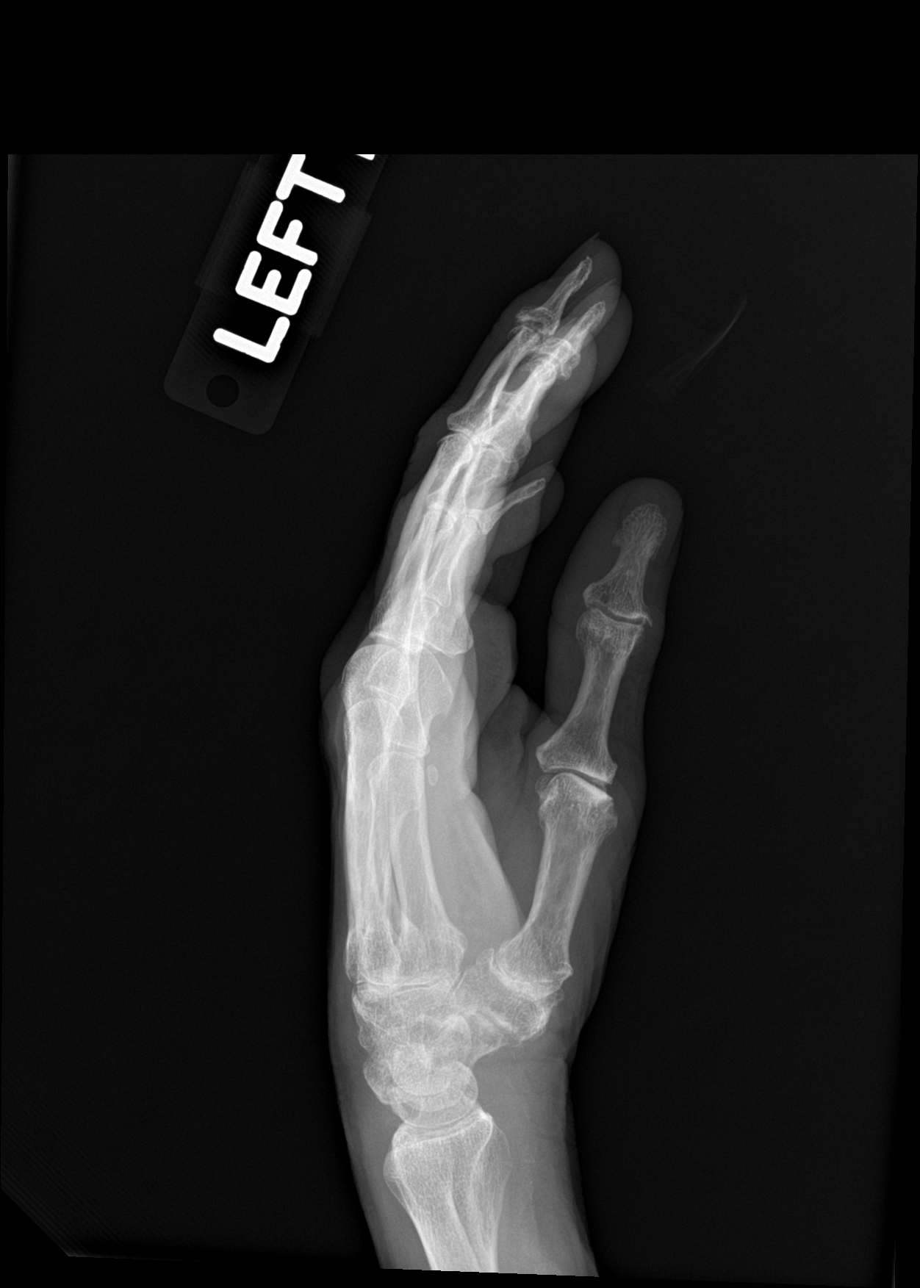

[hand lat (2 of 2)]
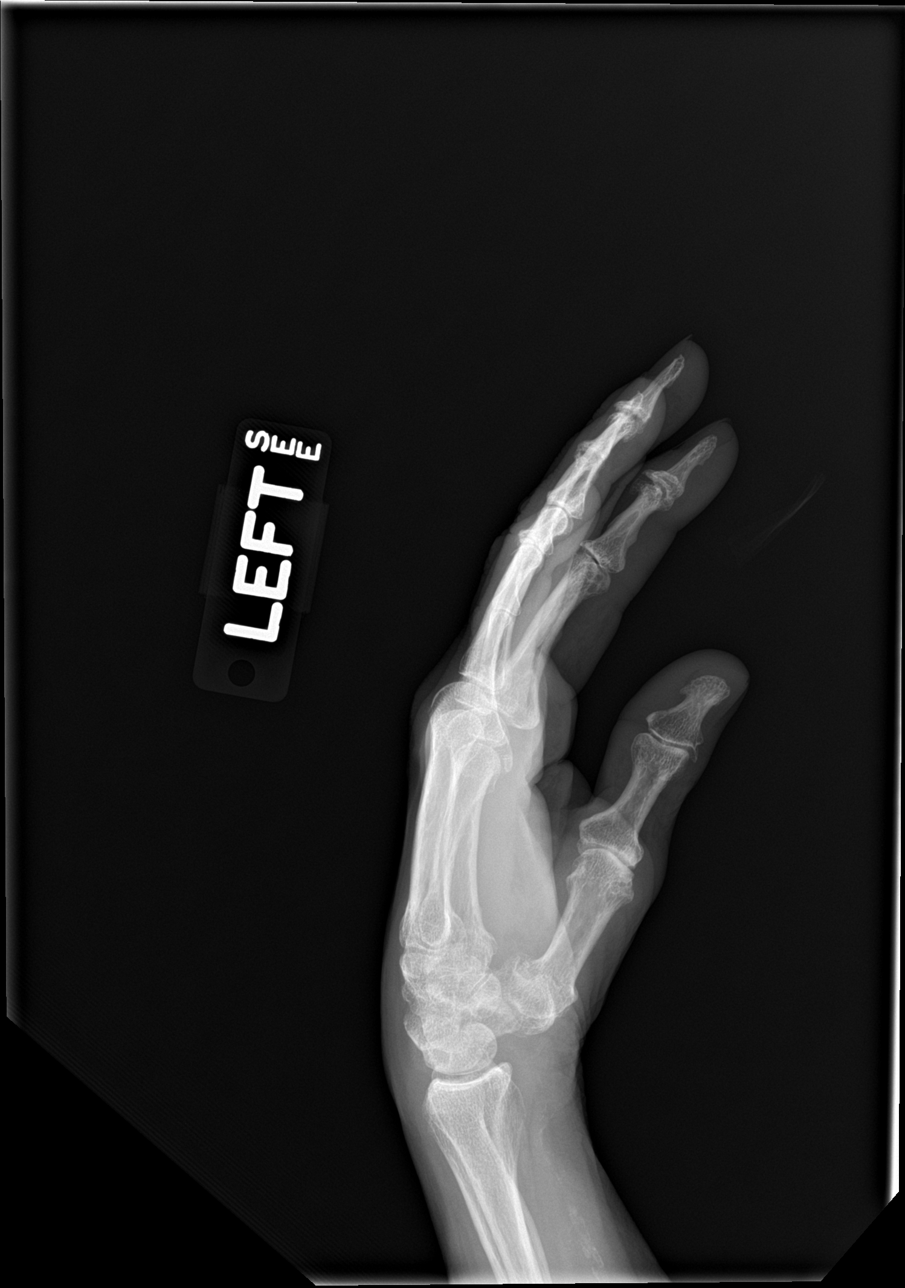

[4 of 4 positions shown; findings below may reference images not displayed]

FINDINGS: Distal radius and ulna appear intact. There is abnormal widening of
the scapholunate interval. But otherwise carpal bone alignment is
maintained. Joint space loss and subchondral sclerosis along the
radial carpal row and also at the 1st CMC joint.

Questionable healed 5th metacarpal fracture. Metacarpals appear
intact.

Diffuse distal IP joint space loss with bulky osteophytosis in
keeping with osteoarthritis. No phalanx fracture or dislocation
identified.

No discrete soft tissue injury. Calcified peripheral vascular
disease at the distal forearm and wrist.
IMPRESSION: 1. Widening of the Scapholunate interval compatible with acute or
chronic ligamentous injury which can predispose to SLAC wrist.
2. No acute fracture or dislocation identified about the left hand.
3. Osteoarthritis at the 1st CMC joint and all DIP joints.
4. Calcified peripheral vascular disease.

## 2022-01-31 IMAGING — DX DG CHEST 1V PORT
1 series · 1 of 1 positions shown · non-contrast
Comparison: None.

CLINICAL DATA: Shortness of breath.

EXAM:
PORTABLE CHEST 1 VIEW

[chest ap]
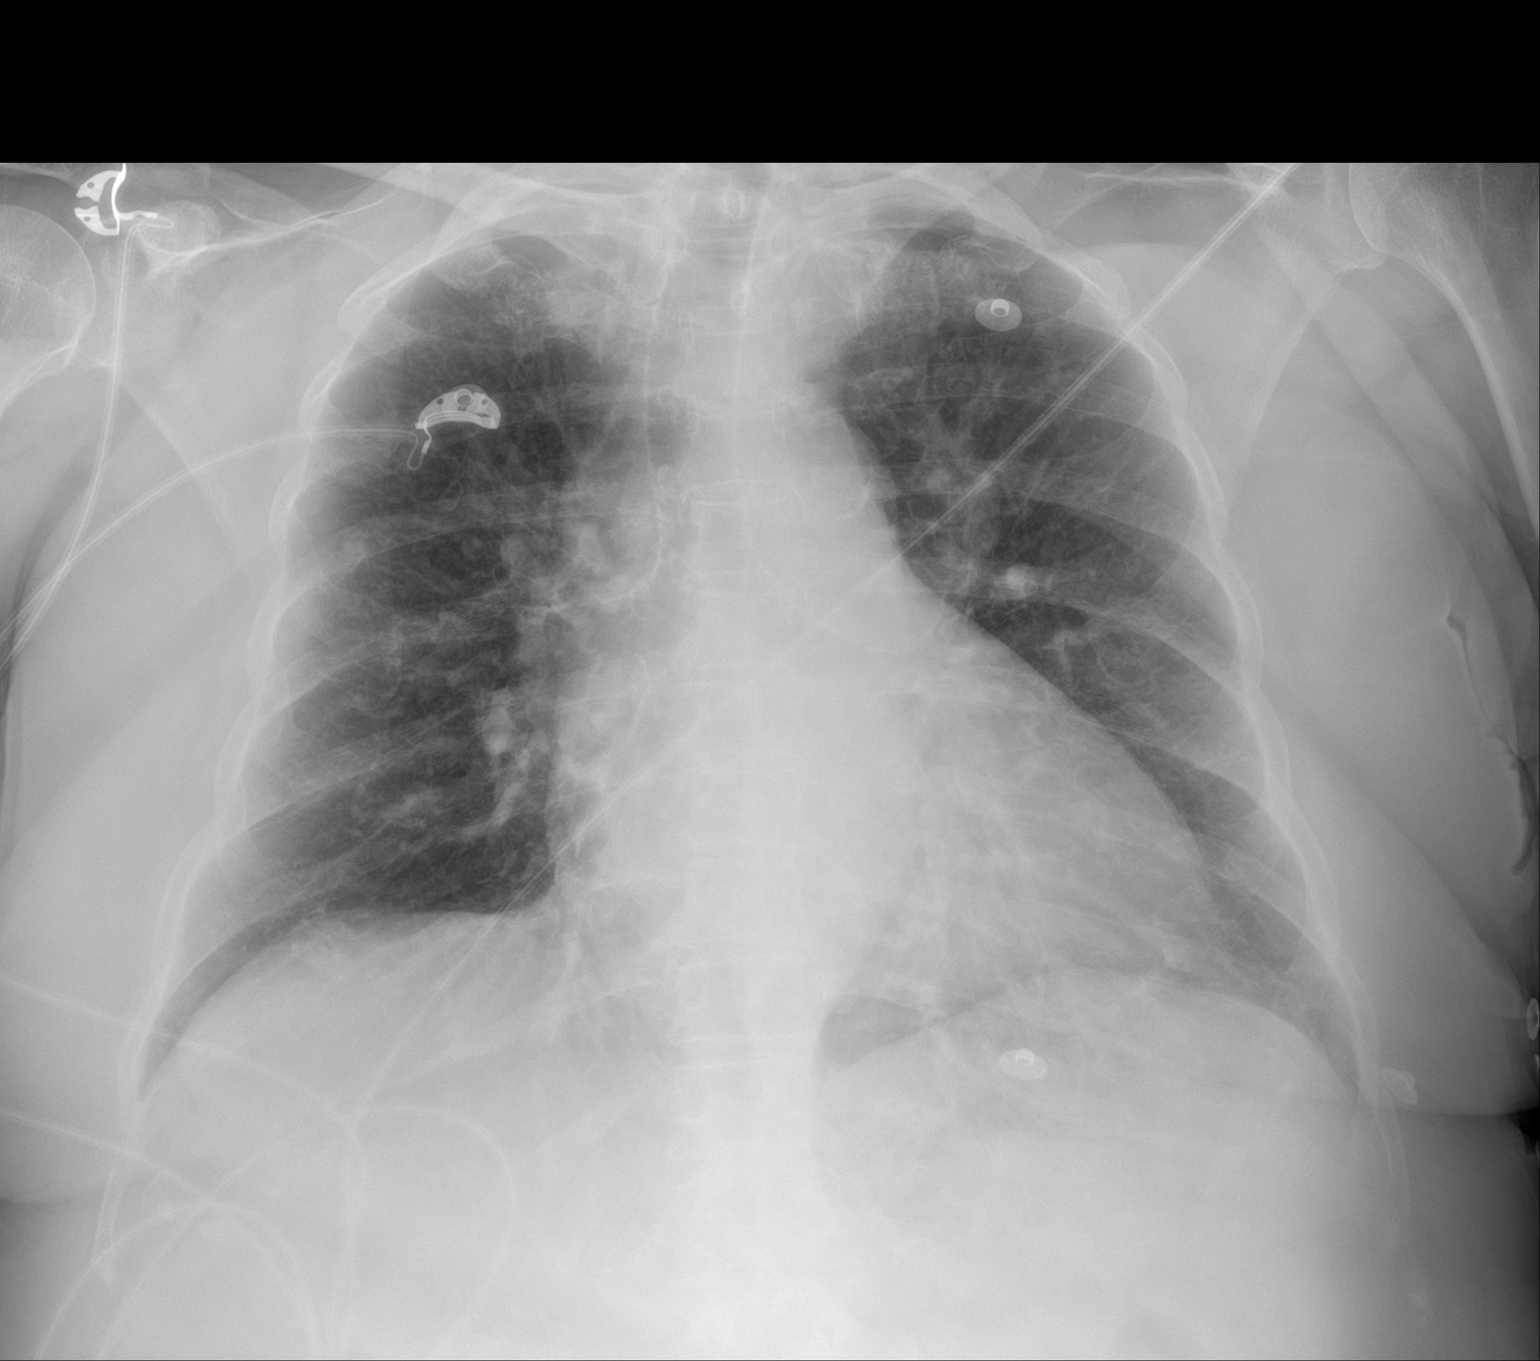

[1 of 1 positions shown; findings below may reference images not displayed]

FINDINGS: Mild cardiomegaly with mild central vascular congestion. No focal
consolidation, pleural effusion, or pneumothorax. Atherosclerotic
calcification of the aorta. No acute osseous pathology.
IMPRESSION: Mild cardiomegaly with mild central vascular congestion. No focal
consolidation.

## 2022-01-31 IMAGING — DX DG HAND COMPLETE 3+V*R*
3 series · 3 of 3 positions shown · non-contrast
Comparison: None.

CLINICAL DATA: Status post fall with head and hand injuries.

EXAM:
RIGHT HAND - COMPLETE 3+ VIEW

[hand obl (1 of 2)]
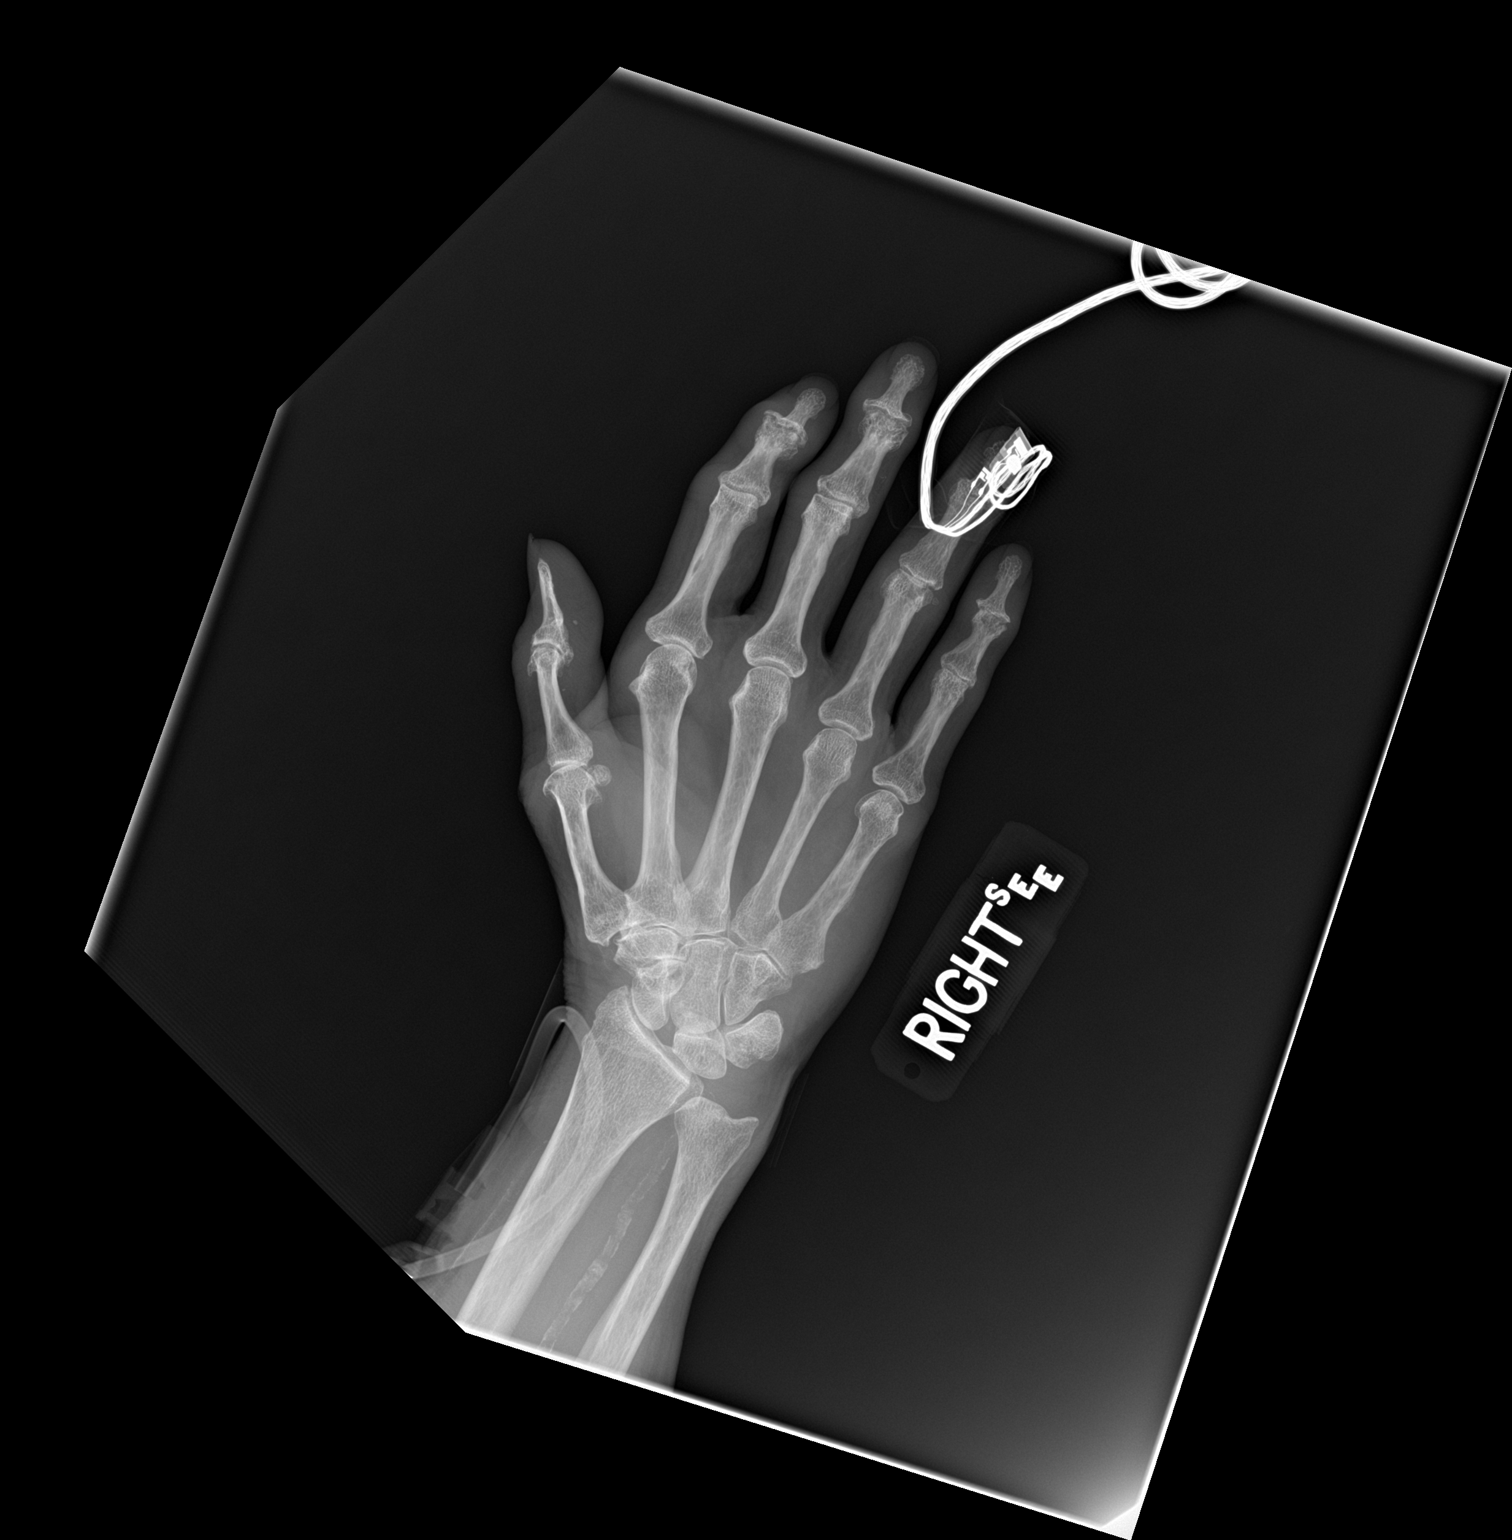

[hand lat]
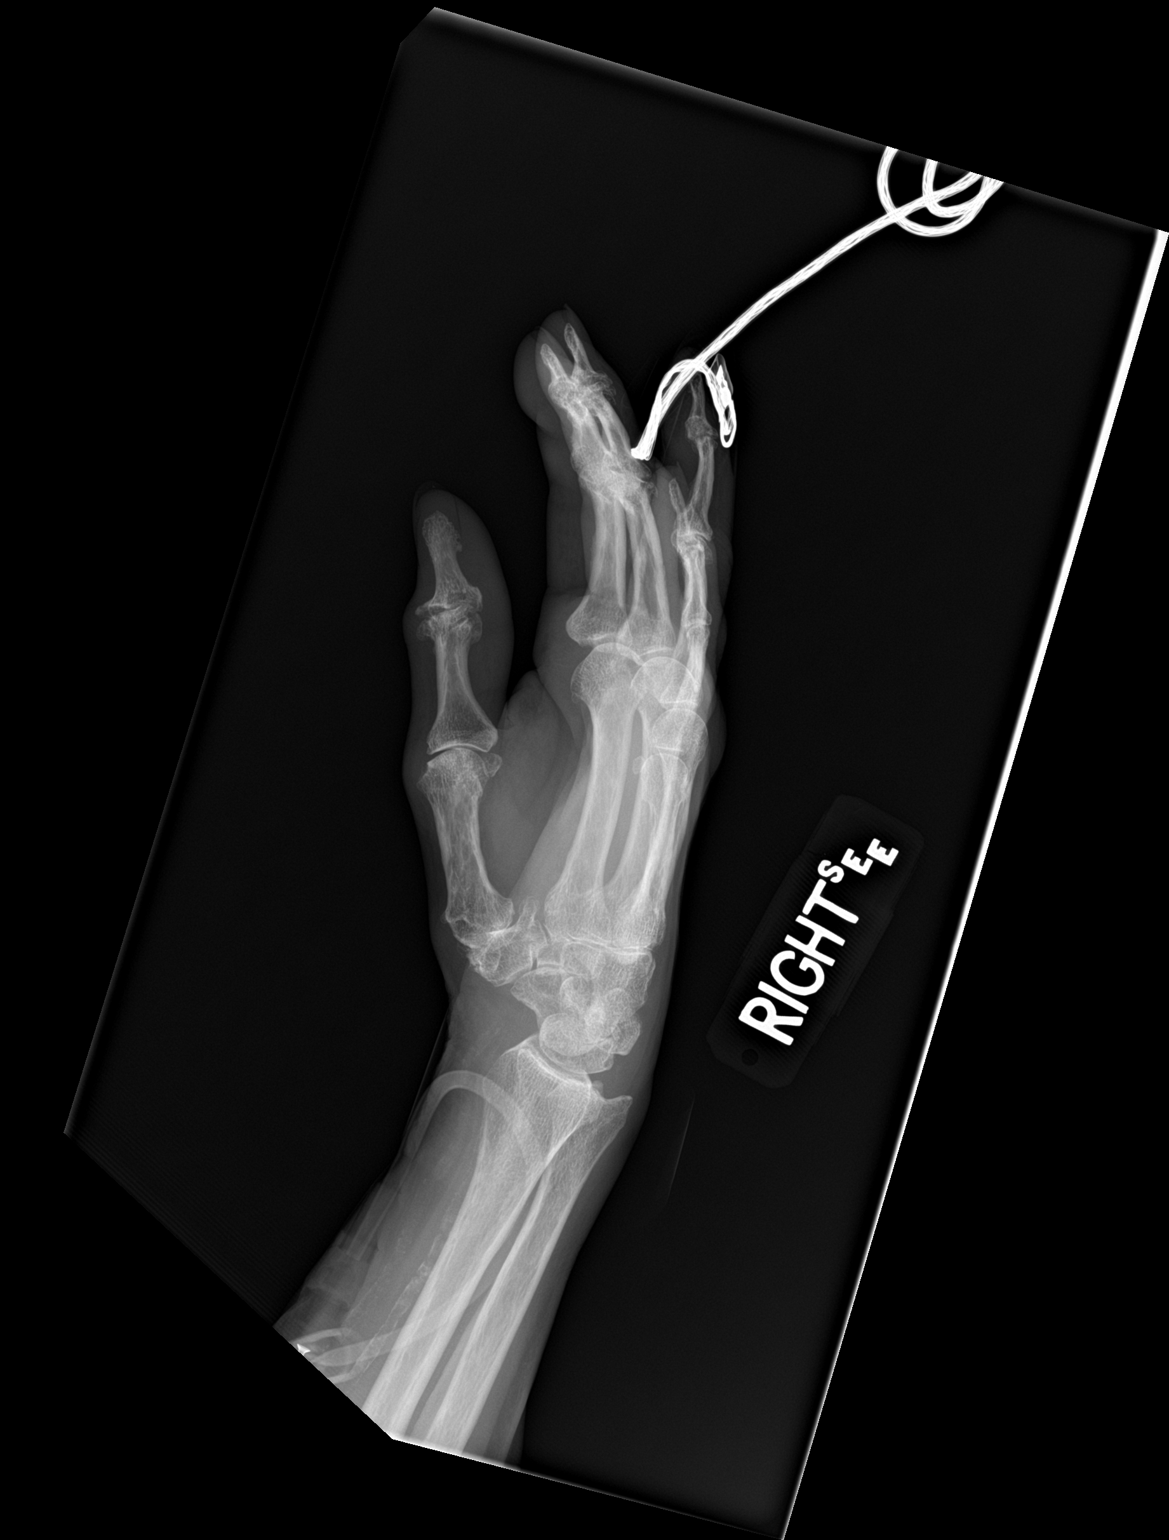

[hand obl (2 of 2)]
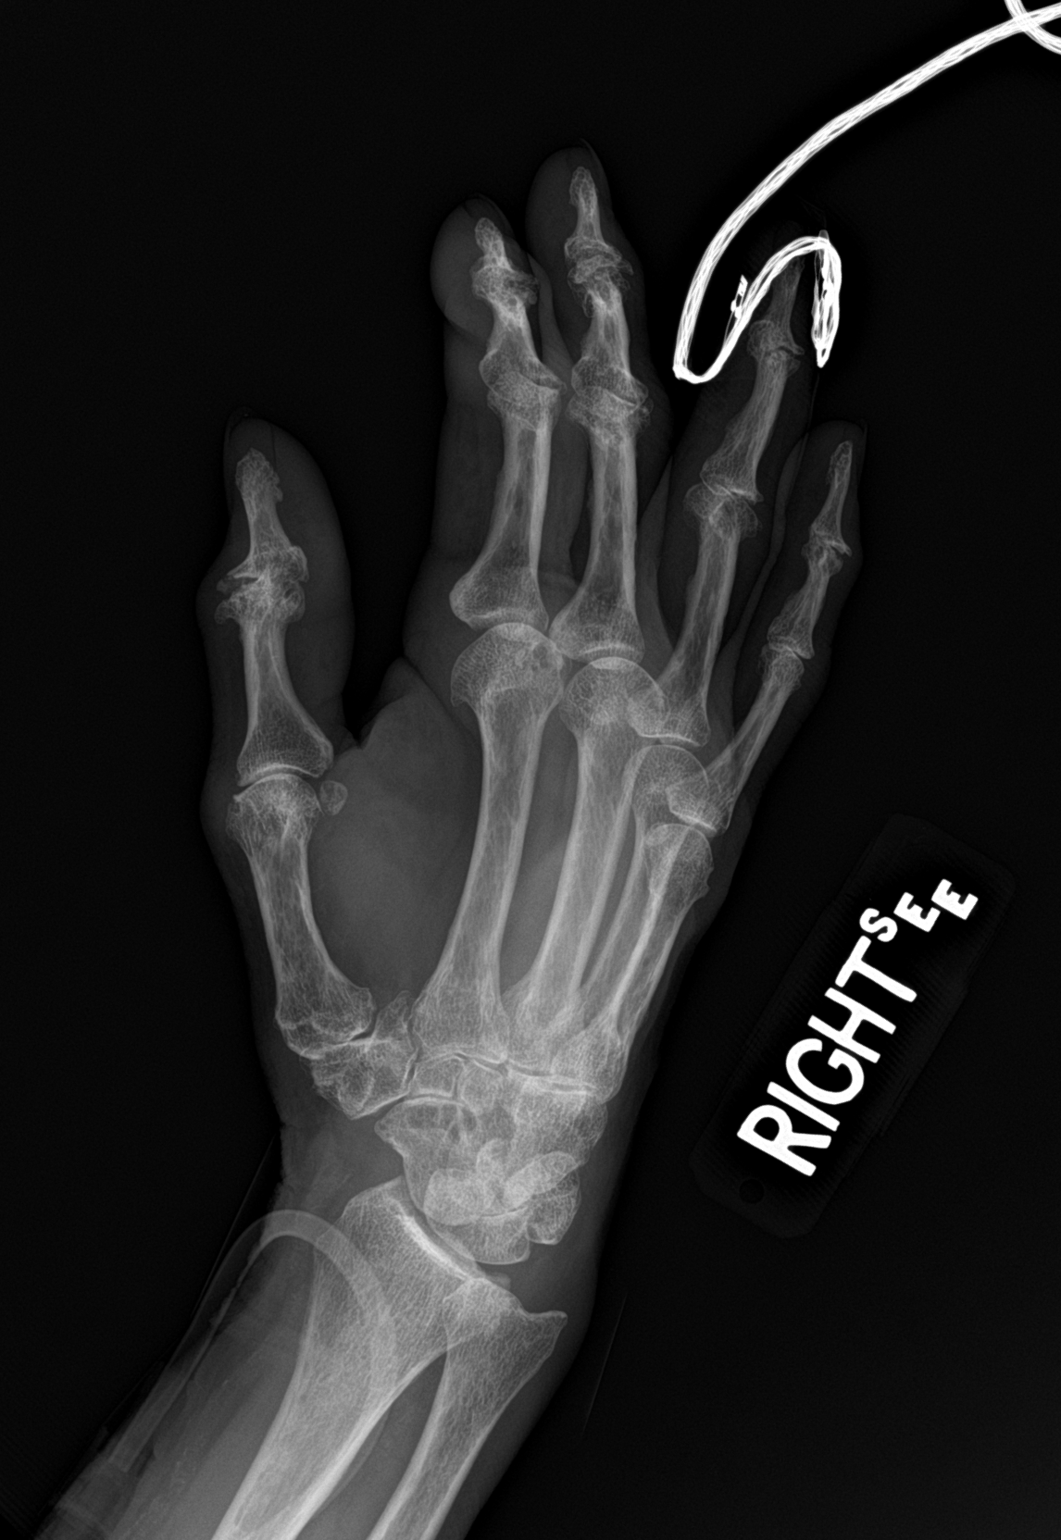

[3 of 3 positions shown; findings below may reference images not displayed]

FINDINGS: No acute fracture or dislocation identified. There are marked
degenerative changes involving the D IP joints. Moderate basilar
joint and first MCP joint degenerative change also noted. Soft
tissues are unremarkable.
IMPRESSION: 1. No acute findings.
2. Advanced osteoarthritis.

## 2022-01-31 IMAGING — MR MR THORACIC SPINE WO/W CM
5 of 11 series · 20 of 48 positions shown · IV contrast (gadavist)
Comparison: None.

CLINICAL DATA: Fall

EXAM:
MRI CERVICAL AND THORACIC SPINE WITHOUT AND WITH CONTRAST
TECHNIQUE: Multiplanar and multiecho pulse sequences of the cervical spine, to
include the craniocervical junction and cervicothoracic junction,
and the thoracic spine, were obtained without and with intravenous
contrast.
CONTRAST:  8mL GADAVIST GADOBUTROL 1 MMOL/ML IV SOLN

[Series 18: T1 · sagittal · 3.3mm · 0.62mm/px · 2 of 9 slices shown (1 of 3)]
[im 1/9]
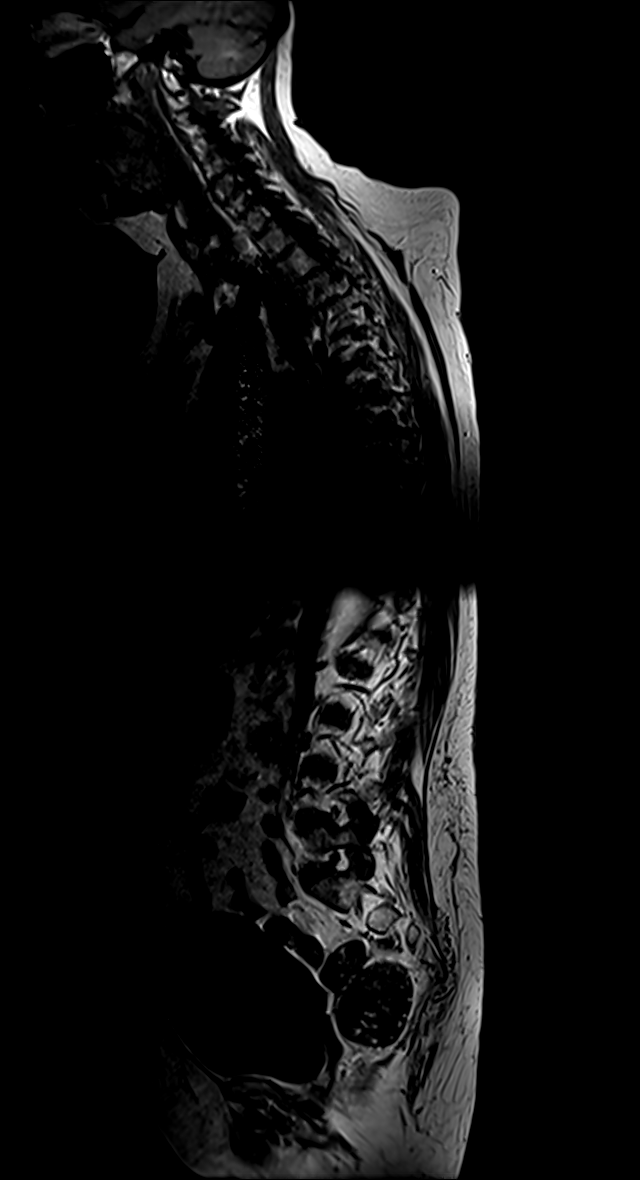
[im 9/9]
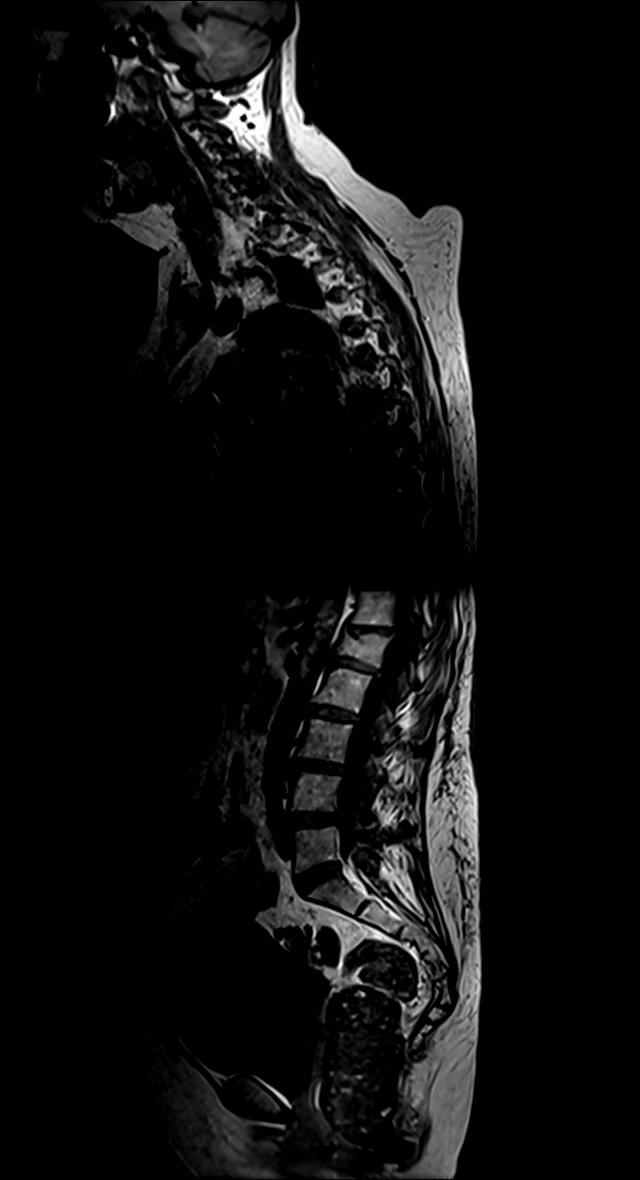

[Series 19: T2 · sagittal · 3.0mm · 0.80mm/px · 3 of 19 slices shown (1 of 2)]
[im 1/19]
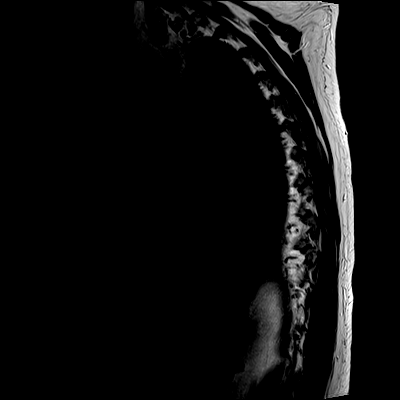
[im 10/19]
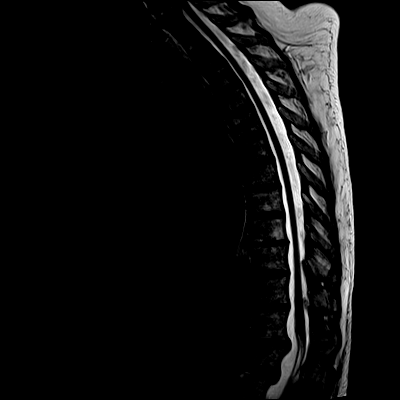
[im 19/19]
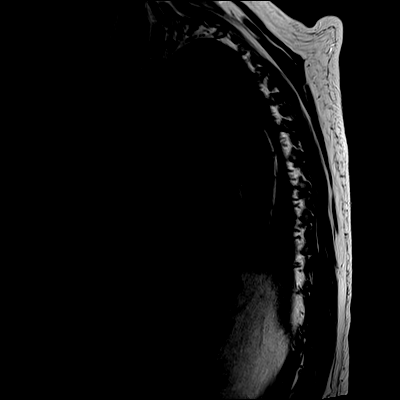

[Series 20: T1 · sagittal · 3.0mm · 0.80mm/px · 3 of 19 slices shown (2 of 3)]
[im 1/19]
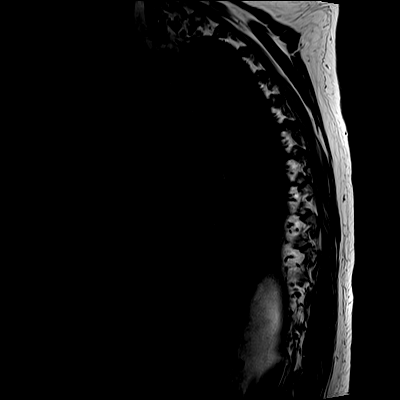
[im 10/19]
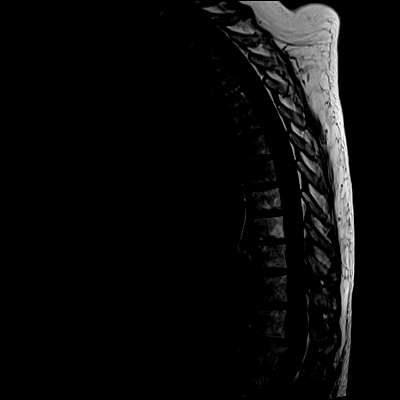
[im 19/19]
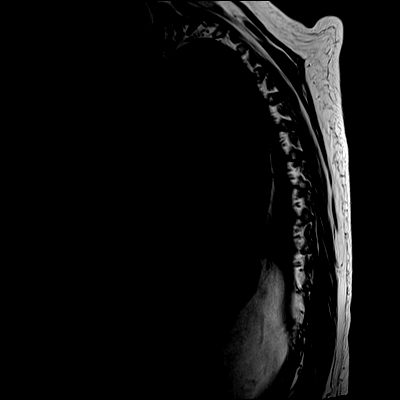

[Series 22: T2 · axial · 5.0mm · 0.59mm/px · z∈[-241,-44]mm · 7 of 39 slices shown (2 of 2)]
[im 1/39]
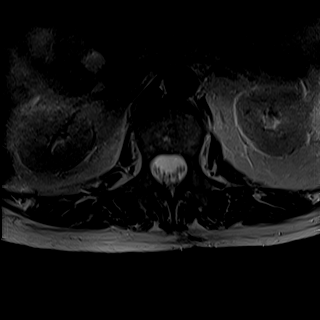
[im 7/39]
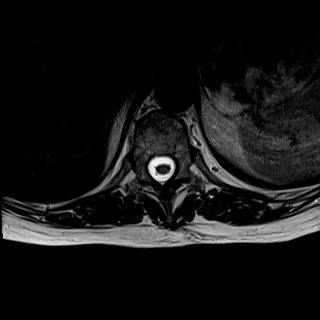
[im 13/39]
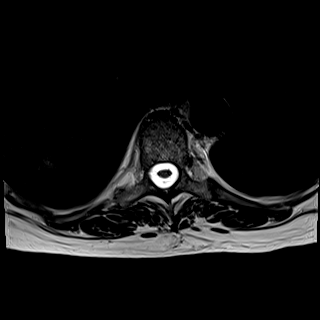
[im 20/39]
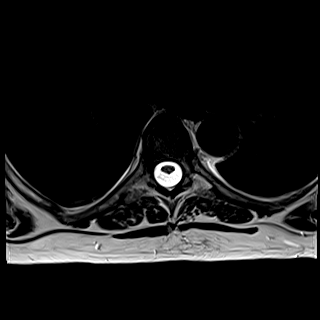
[im 26/39]
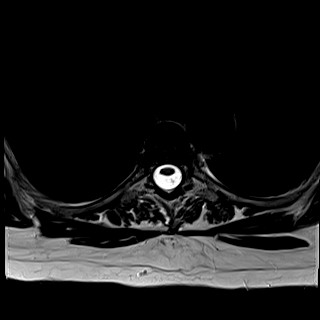
[im 32/39]
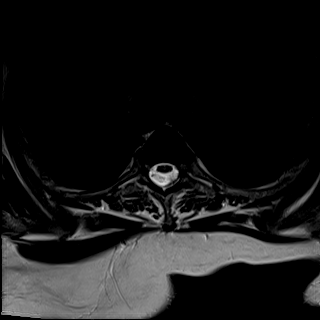
[im 39/39]
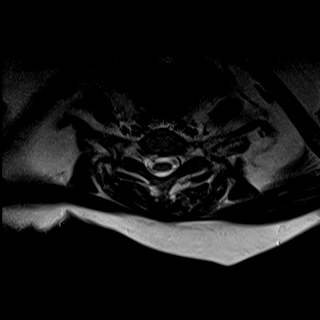

[Series 24: T1 · axial · non-contrast · 5.0mm · 0.31mm/px · z∈[-241,-91]mm · 5 of 39 slices shown (3 of 3)]
[im 1/39]
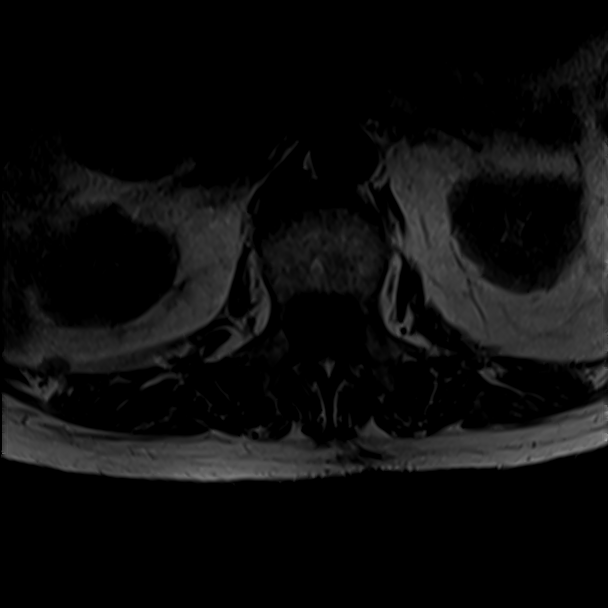
[im 7/39]
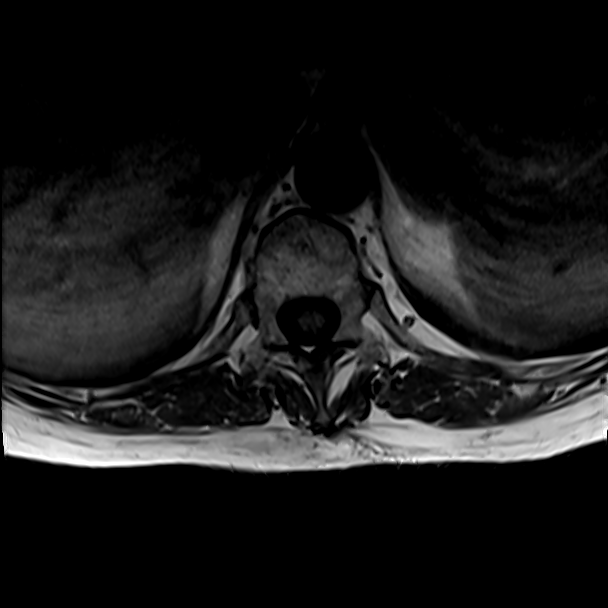
[im 13/39]
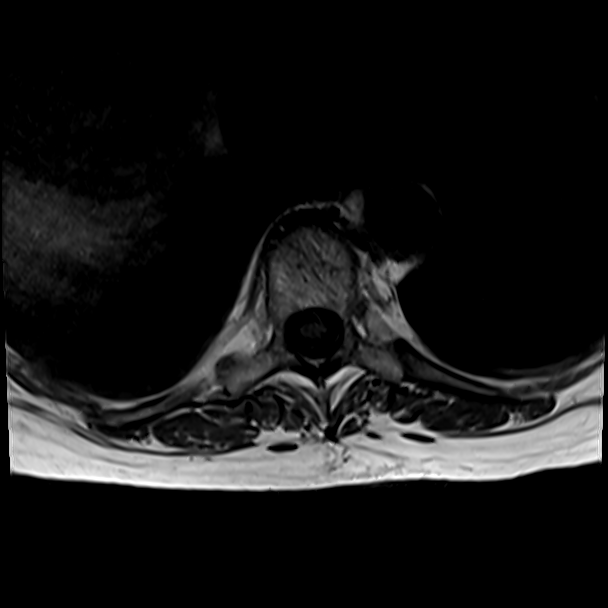
[im 20/39]
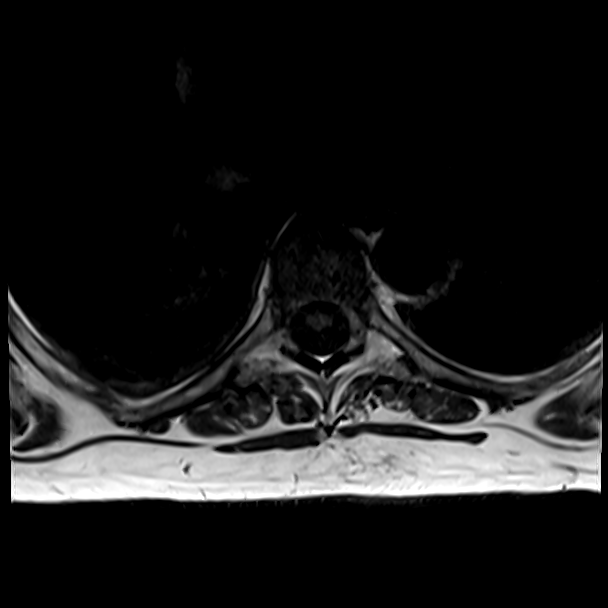
[im 26/39]
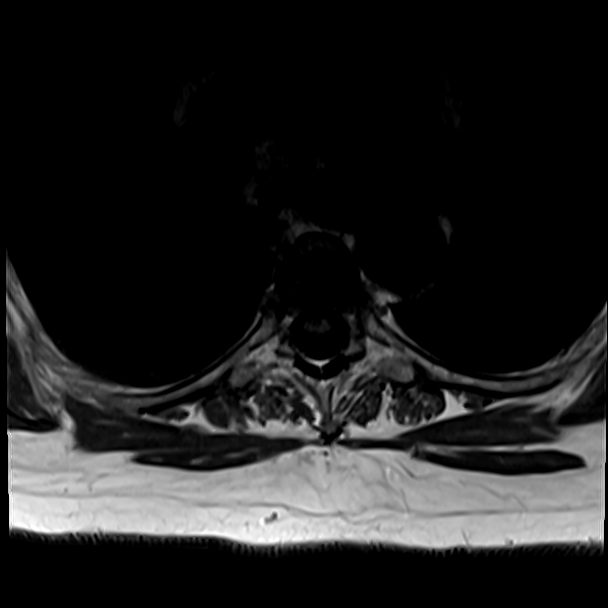

[20 of 48 positions shown; findings below may reference images not displayed]

FINDINGS: MRI CERVICAL SPINE FINDINGS

Alignment: Grade 1 retrolisthesis at C5-6 and grade 1
anterolisthesis at C6-7

Vertebrae: No fracture, evidence of discitis, or bone lesion.

Cord: Normal signal and morphology.

Posterior Fossa, vertebral arteries, paraspinal tissues: Negative.

Disc levels:

C1-2: Unremarkable.

C2-3: Normal disc space and facet joints. There is no spinal canal
stenosis. No neural foraminal stenosis.

C3-4: Left uncovertebral hypertrophy. There is no spinal canal
stenosis. Mild left neural foraminal stenosis.

C4-5: Small disc bulge with bilateral uncovertebral hypertrophy.
Mild spinal canal stenosis. Mild right and moderate left neural
foraminal stenosis.

C5-6: Small disc bulge with uncovertebral hypertrophy. Moderate
spinal canal stenosis. Severe bilateral neural foraminal stenosis.

C6-7: Small disc bulge with uncovertebral hypertrophy. There is no
spinal canal stenosis. No neural foraminal stenosis.

C7-T1: Normal disc space and facet joints. There is no spinal canal
stenosis. No neural foraminal stenosis.

MRI THORACIC SPINE FINDINGS

Alignment:  Physiologic.

Vertebrae: There is mild height loss at T7 with a small amount of
bone marrow edema. The other thoracic vertebral bodies are normal.

Cord:  Normal signal and morphology.

Paraspinal and other soft tissues: Negative.

Disc levels:

No spinal canal stenosis.
IMPRESSION: 1. Mild T7 compression fracture with a small amount of bone marrow
edema, compatible with acute to subacute injury. Minimal height
loss.
2. Multilevel degenerative disc disease of the cervical spine with
moderate C5-6 and mild C4-5 spinal canal stenosis.
3. Severe bilateral neural foraminal stenosis at C5-6.

## 2022-01-31 IMAGING — MR MR CERVICAL SPINE WO/W CM
6 of 8 series · 28 of 48 positions shown · IV contrast (gadavist)
Comparison: None.

CLINICAL DATA: Fall

EXAM:
MRI CERVICAL AND THORACIC SPINE WITHOUT AND WITH CONTRAST
TECHNIQUE: Multiplanar and multiecho pulse sequences of the cervical spine, to
include the craniocervical junction and cervicothoracic junction,
and the thoracic spine, were obtained without and with intravenous
contrast.
CONTRAST:  8mL GADAVIST GADOBUTROL 1 MMOL/ML IV SOLN

[Series 1: T2 · sagittal · 3.0mm · 0.69mm/px · 3 of 15 slices shown (1 of 2)]
[im 1/15]
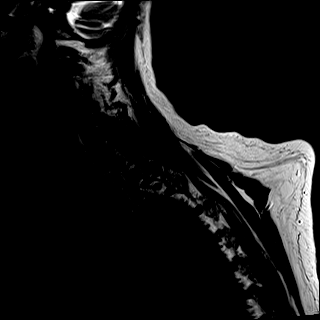
[im 8/15]
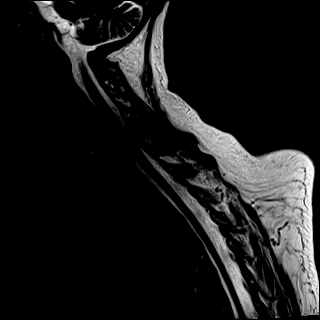
[im 15/15]
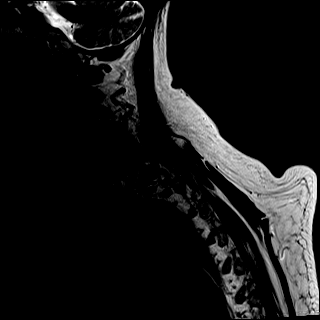

[Series 2: T1 · sagittal · 3.0mm · 0.69mm/px · 3 of 15 slices shown (1 of 2)]
[im 1/15]
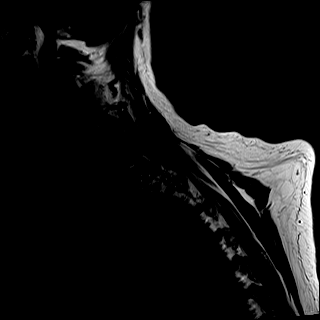
[im 8/15]
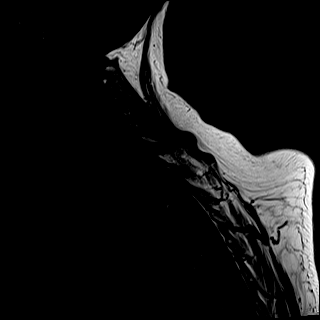
[im 15/15]
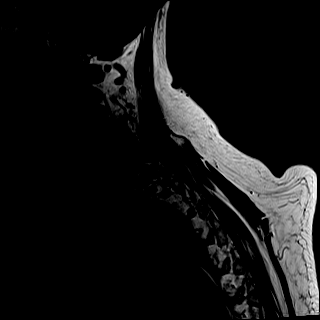

[Series 3: STIR · sagittal · 3.0mm · 0.86mm/px · 1 of 15 slices shown]
[im 1/15]
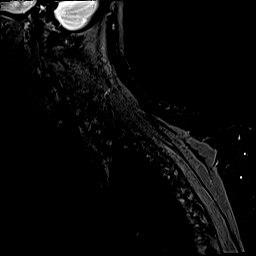

[Series 4: T2 · axial · 3.0mm · 0.62mm/px · z∈[-49,+66]mm · 9 of 41 slices shown (2 of 2)]
[im 1/41]
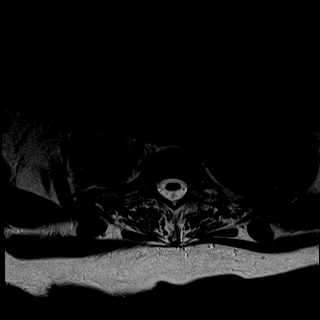
[im 6/41]
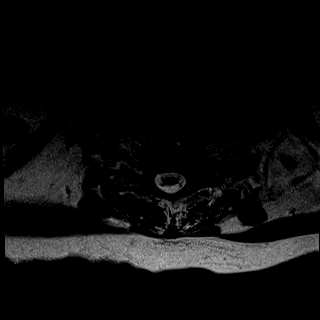
[im 11/41]
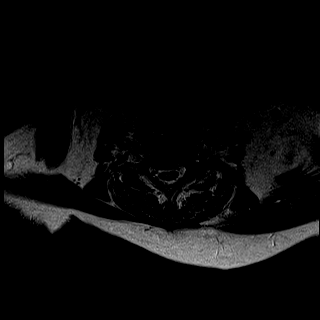
[im 16/41]
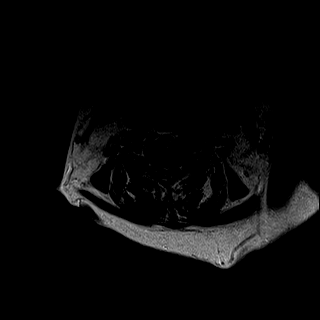
[im 21/41]
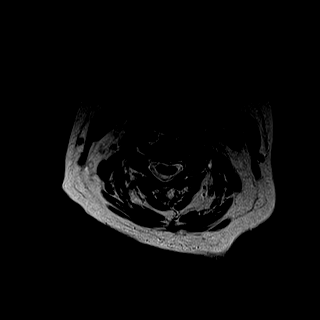
[im 26/41]
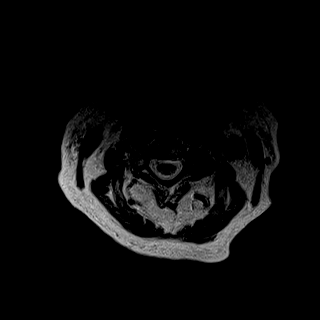
[im 31/41]
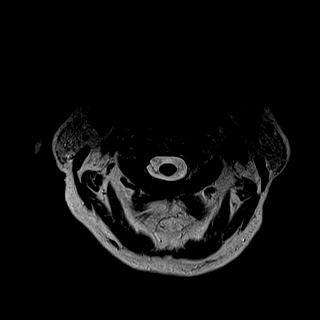
[im 36/41]
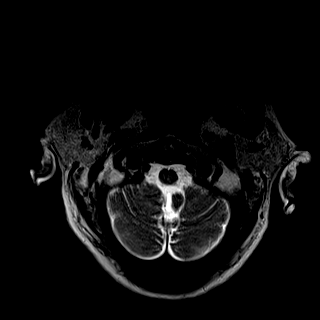
[im 41/41]
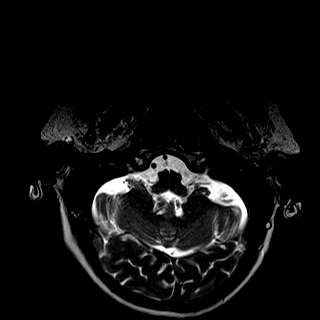

[Series 6: T1 · axial · 3.0mm · 0.35mm/px · z∈[-44,+71]mm · 9 of 41 slices shown (2 of 2)]
[im 1/41]
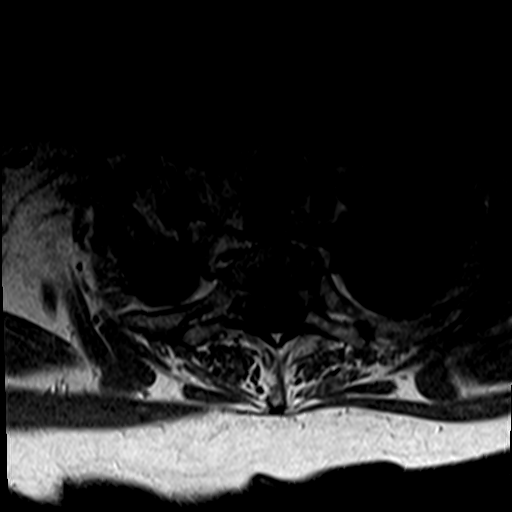
[im 6/41]
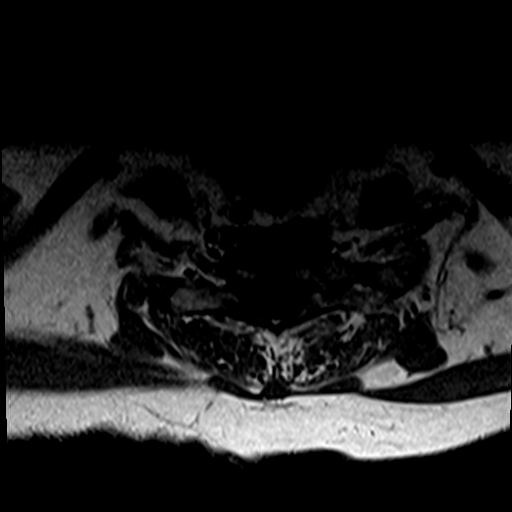
[im 11/41]
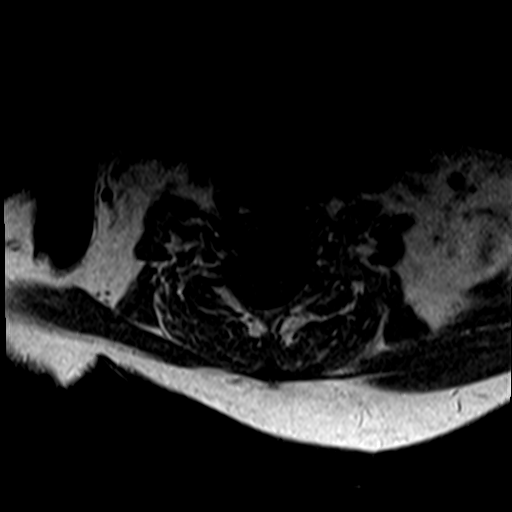
[im 16/41]
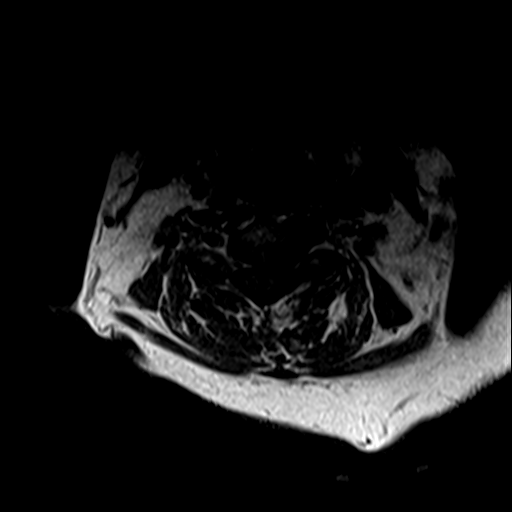
[im 21/41]
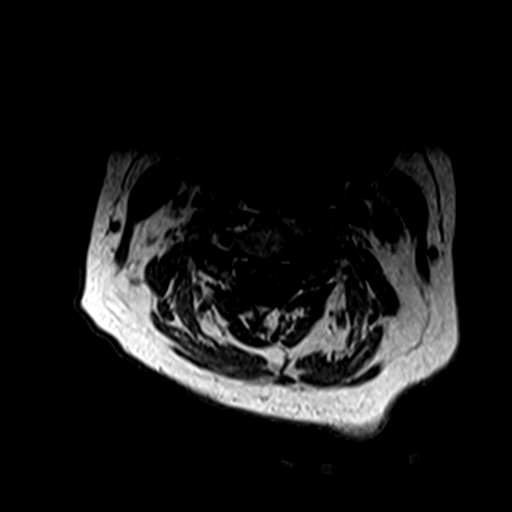
[im 26/41]
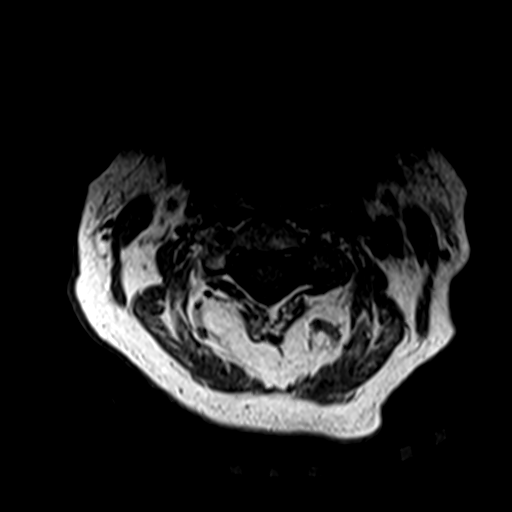
[im 31/41]
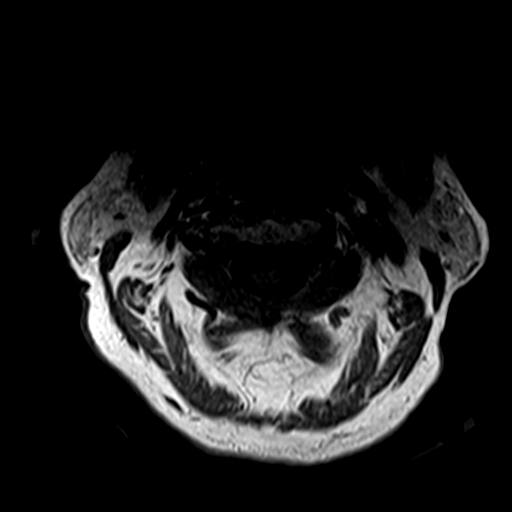
[im 36/41]
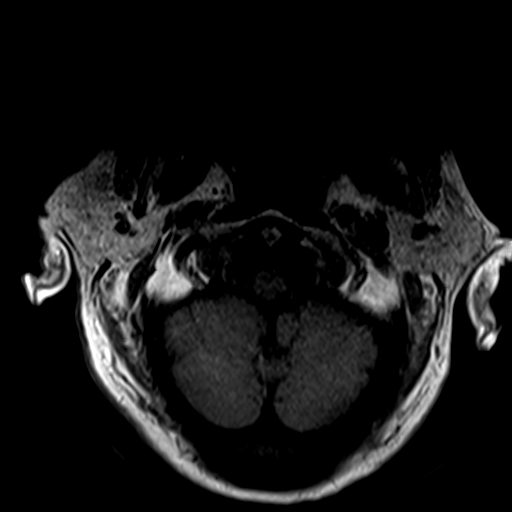
[im 41/41]
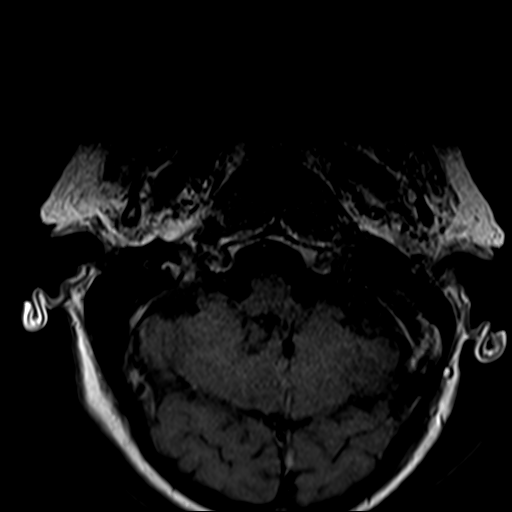

[Series 7: T1 fat-sat post-contrast · sagittal · 3.0mm · 0.43mm/px · 3 of 15 slices shown]
[im 1/15]
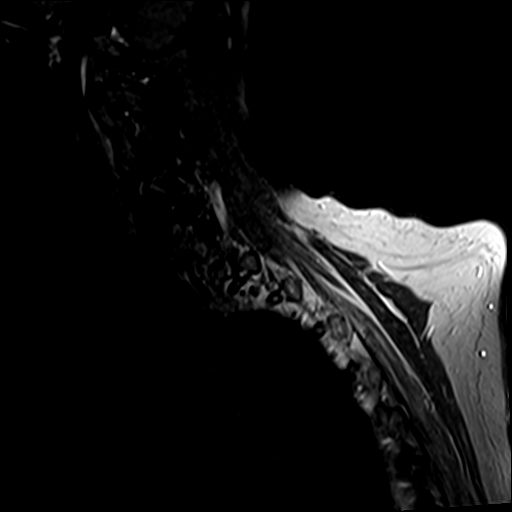
[im 8/15]
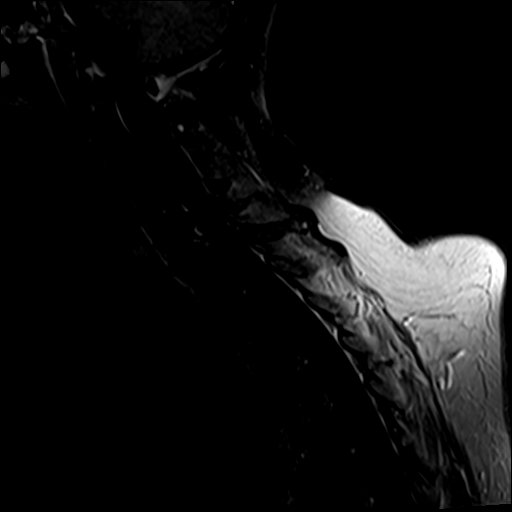
[im 15/15]
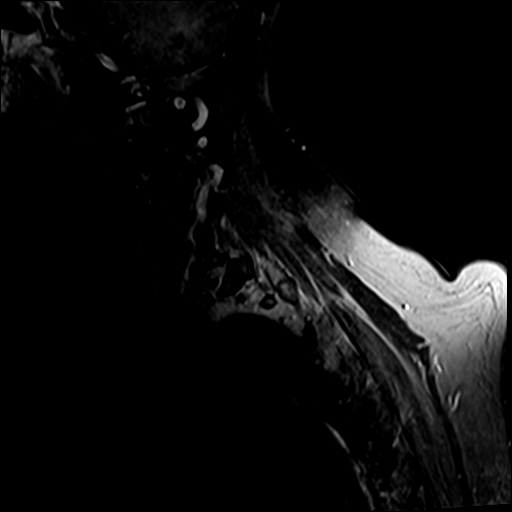

[28 of 48 positions shown; findings below may reference images not displayed]

FINDINGS: MRI CERVICAL SPINE FINDINGS

Alignment: Grade 1 retrolisthesis at C5-6 and grade 1
anterolisthesis at C6-7

Vertebrae: No fracture, evidence of discitis, or bone lesion.

Cord: Normal signal and morphology.

Posterior Fossa, vertebral arteries, paraspinal tissues: Negative.

Disc levels:

C1-2: Unremarkable.

C2-3: Normal disc space and facet joints. There is no spinal canal
stenosis. No neural foraminal stenosis.

C3-4: Left uncovertebral hypertrophy. There is no spinal canal
stenosis. Mild left neural foraminal stenosis.

C4-5: Small disc bulge with bilateral uncovertebral hypertrophy.
Mild spinal canal stenosis. Mild right and moderate left neural
foraminal stenosis.

C5-6: Small disc bulge with uncovertebral hypertrophy. Moderate
spinal canal stenosis. Severe bilateral neural foraminal stenosis.

C6-7: Small disc bulge with uncovertebral hypertrophy. There is no
spinal canal stenosis. No neural foraminal stenosis.

C7-T1: Normal disc space and facet joints. There is no spinal canal
stenosis. No neural foraminal stenosis.

MRI THORACIC SPINE FINDINGS

Alignment:  Physiologic.

Vertebrae: There is mild height loss at T7 with a small amount of
bone marrow edema. The other thoracic vertebral bodies are normal.

Cord:  Normal signal and morphology.

Paraspinal and other soft tissues: Negative.

Disc levels:

No spinal canal stenosis.
IMPRESSION: 1. Mild T7 compression fracture with a small amount of bone marrow
edema, compatible with acute to subacute injury. Minimal height
loss.
2. Multilevel degenerative disc disease of the cervical spine with
moderate C5-6 and mild C4-5 spinal canal stenosis.
3. Severe bilateral neural foraminal stenosis at C5-6.

## 2022-01-31 MED ORDER — POLYETHYLENE GLYCOL 3350 17 G PO PACK: 17.0000 g | PACK | Freq: Every day | ORAL | Status: DC | PRN

## 2022-01-31 MED ORDER — TEMAZEPAM 15 MG PO CAPS: 15.0000 mg | ORAL_CAPSULE | Freq: Every evening | ORAL | Status: DC | PRN

## 2022-01-31 MED ORDER — BISACODYL 5 MG PO TBEC: 5.0000 mg | DELAYED_RELEASE_TABLET | Freq: Every day | ORAL | Status: DC | PRN

## 2022-01-31 MED ORDER — ONDANSETRON HCL 4 MG/2ML IJ SOLN
4.0000 mg | Freq: Four times a day (QID) | INTRAMUSCULAR | Status: DC | PRN
  Administered 2022-01-31: 4 mg via INTRAVENOUS
  Filled 2022-01-31: qty 2

## 2022-01-31 MED ORDER — OXYCODONE HCL 5 MG PO TABS
5.0000 mg | ORAL_TABLET | ORAL | Status: DC | PRN
  Administered 2022-01-31 – 2022-02-06 (×17): 5 mg via ORAL
  Filled 2022-01-31 (×17): qty 1

## 2022-01-31 MED ORDER — MORPHINE SULFATE (PF) 2 MG/ML IV SOLN
2.0000 mg | INTRAVENOUS | Status: DC | PRN
  Administered 2022-01-31 – 2022-02-06 (×9): 2 mg via INTRAVENOUS
  Filled 2022-01-31 (×10): qty 1

## 2022-01-31 MED ORDER — BUPROPION HCL ER (XL) 150 MG PO TB24
300.0000 mg | ORAL_TABLET | Freq: Every day | ORAL | Status: DC
  Administered 2022-01-31 – 2022-02-07 (×8): 300 mg via ORAL
  Filled 2022-01-31 (×5): qty 2
  Filled 2022-01-31: qty 1
  Filled 2022-01-31 (×2): qty 2

## 2022-01-31 MED ORDER — LACTATED RINGERS IV SOLN: INTRAVENOUS | Status: AC

## 2022-01-31 MED ORDER — DOCUSATE SODIUM 100 MG PO CAPS
100.0000 mg | ORAL_CAPSULE | Freq: Two times a day (BID) | ORAL | Status: DC
  Administered 2022-01-31 – 2022-02-04 (×7): 100 mg via ORAL
  Filled 2022-01-31 (×9): qty 1

## 2022-01-31 MED ORDER — HYDRALAZINE HCL 20 MG/ML IJ SOLN: 5.0000 mg | INTRAMUSCULAR | Status: DC | PRN

## 2022-01-31 MED ORDER — INSULIN ASPART 100 UNIT/ML IJ SOLN: 0.0000 [IU] | Freq: Every day | INTRAMUSCULAR | Status: DC

## 2022-01-31 MED ORDER — METHOCARBAMOL 1000 MG/10ML IJ SOLN
500.0000 mg | Freq: Four times a day (QID) | INTRAVENOUS | Status: DC | PRN
  Filled 2022-01-31: qty 5

## 2022-01-31 MED ORDER — LEVOTHYROXINE SODIUM 50 MCG PO TABS
50.0000 ug | ORAL_TABLET | Freq: Every morning | ORAL | Status: DC
  Administered 2022-02-01 – 2022-02-07 (×7): 50 ug via ORAL
  Filled 2022-01-31 (×7): qty 1

## 2022-01-31 MED ORDER — ACETAMINOPHEN 650 MG RE SUPP: 650.0000 mg | Freq: Four times a day (QID) | RECTAL | Status: DC | PRN

## 2022-01-31 MED ORDER — ONDANSETRON HCL 4 MG/2ML IJ SOLN
4.0000 mg | Freq: Once | INTRAMUSCULAR | Status: AC
Start: 2022-01-31 — End: 2022-01-31
  Administered 2022-01-31: 4 mg via INTRAVENOUS

## 2022-01-31 MED ORDER — ONDANSETRON HCL 4 MG PO TABS: 4.0000 mg | ORAL_TABLET | Freq: Four times a day (QID) | ORAL | Status: DC | PRN

## 2022-01-31 MED ORDER — SODIUM CHLORIDE 0.9% FLUSH
3.0000 mL | Freq: Two times a day (BID) | INTRAVENOUS | Status: DC
  Administered 2022-01-31 – 2022-02-07 (×13): 3 mL via INTRAVENOUS

## 2022-01-31 MED ORDER — ACETAMINOPHEN 325 MG PO TABS
650.0000 mg | ORAL_TABLET | Freq: Four times a day (QID) | ORAL | Status: DC | PRN
  Administered 2022-02-01 – 2022-02-06 (×10): 650 mg via ORAL
  Filled 2022-01-31 (×10): qty 2

## 2022-01-31 MED ORDER — GADOBUTROL 1 MMOL/ML IV SOLN
8.0000 mL | Freq: Once | INTRAVENOUS | Status: AC | PRN
  Administered 2022-01-31: 8 mL via INTRAVENOUS

## 2022-01-31 MED ORDER — INSULIN ASPART 100 UNIT/ML IJ SOLN
0.0000 [IU] | Freq: Three times a day (TID) | INTRAMUSCULAR | Status: DC
  Administered 2022-01-31: 3 [IU] via SUBCUTANEOUS
  Administered 2022-02-01: 2 [IU] via SUBCUTANEOUS
  Administered 2022-02-01: 5 [IU] via SUBCUTANEOUS
  Administered 2022-02-01: 3 [IU] via SUBCUTANEOUS
  Administered 2022-02-02: 2 [IU] via SUBCUTANEOUS
  Administered 2022-02-02: 10 [IU] via SUBCUTANEOUS
  Administered 2022-02-02: 3 [IU] via SUBCUTANEOUS
  Administered 2022-02-03: 5 [IU] via SUBCUTANEOUS
  Administered 2022-02-03 – 2022-02-04 (×4): 3 [IU] via SUBCUTANEOUS
  Administered 2022-02-04: 5 [IU] via SUBCUTANEOUS
  Administered 2022-02-05: 3 [IU] via SUBCUTANEOUS
  Administered 2022-02-05: 5 [IU] via SUBCUTANEOUS
  Administered 2022-02-05: 3 [IU] via SUBCUTANEOUS
  Administered 2022-02-06: 5 [IU] via SUBCUTANEOUS
  Administered 2022-02-06 – 2022-02-07 (×2): 3 [IU] via SUBCUTANEOUS
  Administered 2022-02-07: 5 [IU] via SUBCUTANEOUS

## 2022-01-31 MED ORDER — ONDANSETRON HCL 4 MG/2ML IJ SOLN
4.0000 mg | Freq: Once | INTRAMUSCULAR | Status: AC
  Administered 2022-01-31: 4 mg via INTRAVENOUS
  Filled 2022-01-31: qty 2

## 2022-01-31 MED ORDER — ASPIRIN EC 325 MG PO TBEC
325.0000 mg | DELAYED_RELEASE_TABLET | Freq: Every day | ORAL | Status: DC
  Administered 2022-01-31 – 2022-02-07 (×8): 325 mg via ORAL
  Filled 2022-01-31 (×8): qty 1

## 2022-01-31 MED ORDER — HYDROMORPHONE HCL 1 MG/ML IJ SOLN
0.5000 mg | Freq: Once | INTRAMUSCULAR | Status: AC
  Administered 2022-01-31: 0.5 mg via INTRAVENOUS
  Filled 2022-01-31: qty 1

## 2022-01-31 MED ORDER — ENOXAPARIN SODIUM 40 MG/0.4ML IJ SOSY
40.0000 mg | PREFILLED_SYRINGE | Freq: Every day | INTRAMUSCULAR | Status: DC
  Administered 2022-01-31 – 2022-02-07 (×8): 40 mg via SUBCUTANEOUS
  Filled 2022-01-31 (×8): qty 0.4

## 2022-01-31 NOTE — Assessment & Plan Note (Signed)
-  Patient with c/o B hand pain -She does report [facility] but her symptoms do not appear to be traumatic in nature -Imaging shows possible scapholunate ligamentous injury on the left with negative imaging on the right; this would like cause thumb/index finger issues but not 3-5 finger symptoms - which she is complaining of -Can try a removal wrist splint for comfort but likely chronic issue and she can do outpatient orthopedic f/u if it remains a problem (discussed with Silvestre Gunner)

## 2022-01-31 NOTE — Assessment & Plan Note (Signed)
-  Recent A1c was 7.4 -She does not appear to be taking outpatient medications -Will cover with moderate-scale SSI for now

## 2022-01-31 NOTE — Assessment & Plan Note (Signed)
-  She has apparent cognitive impairment and this was reported by neurologist recently as well -Delirium precautions ordered -Continue Wellbutrin, Restoril

## 2022-01-31 NOTE — Consult Note (Signed)
Chief Complaint   Chief Complaint  Patient presents with   Fall   Head Injury    History of Present Illness  [name] is a 71 y.o. female presenting to the emergency department after suffering a fall and striking the top of her head.  Upon her presentation to the emergency department she was complaining of severe burning type pain involving both her hands with weakness of both her hands.  Overnight, she has noted marginal improvement in both the dysesthetic pain and improvement in movement of her fingers.  She does not complain of any pain in the neck or shoulders, does have some lower back pain.  No numbness tingling or weakness of the lower extremities.  She has not complained of any changes in bladder function.  Of note, patient does have a history of hypertension, diabetes, and dementia.  She also has history of obstructive sleep apnea, does report some history of what sounds like coronary artery disease and was told she nearly had a heart attack a few years back.  She also reports some history of kidney disease although cannot further specify.  She is not on any blood thinners or antiplatelet agents at this point.  Past Medical History   Past Medical History:  Diagnosis Date   Dementia ([place])    Diabetes mellitus without complication ([place])    Dyslipidemia    Endometrial cancer ([place]) 2021   Hypertension    Hypothyroidism (acquired)    OSA (obstructive sleep apnea)    not on CPAP   Pernicious anemia     Past Surgical History  History reviewed. No pertinent surgical history.  Social History   Social History   Tobacco Use   Smoking status: Never   Smokeless tobacco: Never  Substance Use Topics   Alcohol use: Never   Drug use: Never    Medications   Prior to Admission medications   Not on File    Allergies   Allergies  Allergen Reactions   Acarbose Other (See Comments)   Atenolol Other (See Comments)   Atorvastatin Other (See Comments)   Canagliflozin  Other (See Comments)   Crestor [Rosuvastatin] Other (See Comments)   Empagliflozin Other (See Comments)   Glimepiride Other (See Comments)   Losartan Potassium Other (See Comments)   Metformin Other (See Comments)   Other Other (See Comments)   Pioglitazone Other (See Comments)   Pravastatin Other (See Comments)   Spironolactone-Hctz Other (See Comments)    Review of Systems  ROS  Neurologic Exam  Awake, alert, oriented Memory and concentration grossly intact Speech fluent, appropriate CN grossly intact Motor exam: Upper Extremities Deltoid Bicep Tricep Grip  Right 5/5 5/5 5/5 3/5  Left 5/5 5/5 5/5 3/5   Lower Extremities IP Quad PF DF EHL  Right 5/5 5/5 5/5 5/5 5/5  Left 5/5 5/5 5/5 5/5 5/5   Sensation grossly intact to LT  Imaging  MRI of the cervical spine and thoracic spine was personally reviewed.  This demonstrates straightening of the cervical lordosis.  There is broad-based disc bulge with resultant mild to moderate central stenosis, worst at C4-5, C5-6, C6-7.  I do not identify any T2 signal change within the spinal cord.,  MRI of the thoracic spine reveals likely acute to subacute superior endplate fracture at T7 without significant loss of height.  There is also chronic compression fracture at L1.  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  Her MRI reveals mild  to moderate central stenosis and no signal change within the spinal cord.  She does have significant medical history.  In this situation I would not recommend acute decompression, could have a discussion regarding surgical decompression subacute/chronic setting once she has recovered.  Plan  -Would continue with pain control for her dysesthetic pain.  Can consider steroids and gabapentin -Can utilize TLSO brace for comfort.  Her T7 fracture is certainly not unstable -Can follow-up with me in the outpatient clinic in a few weeks  I have reviewed the situation with the  patient and her daughter.  We have discussed the plan above.  All their questions today were answered.   Consuella Lose, MD [facility]

## 2022-01-31 NOTE — ED Notes (Signed)
Patient transported to MRI 

## 2022-01-31 NOTE — Assessment & Plan Note (Addendum)
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 that is being evaluated by neurology -Also with chronic dizziness that seems to be exacerbated by medications -She stopped taking amitriptyline for this reason and started taking a "brain" supplement (appears to be Niacin and B6) and this caused recurrent dizziness -She also tripped over a cord, which led to the current fall -She appears to be unsafe at home but is not interested in placement -Also with unstable social supports (daughter with schizoaffective d/o, sons are having issues, her sister travels a lot, she thinks her 11yo granddaughter may be her best option for HCPOA) -Will admit with therapy evaluations and ongoing monitoring -Will request [facility] team consult

## 2022-01-31 NOTE — Assessment & Plan Note (Signed)
-  On Repatha as an outpateint

## 2022-01-31 NOTE — H&P (Addendum)
History and Physical    PatientAmilah [name] DDU:[id number] DOB: [birth year] DOA: 01/30/2022 DOS: the patient was seen and examined on 01/31/2022 PCP: System, Provider Not In  Patient coming from: Home - lives alone or family stays with her; NOK: [name], [phone]; [name], [phone]   Chief Complaint: Fall  HPI: [name] is a 71 y.o. female with medical history significant of DM, HTN, dementia, and pernicious anemia presenting with a fall.  She has been sick for a long time.  She went off amitriptyline for migraines and was 50% less dizzy and so wasn't falling TID.  She took some brain herbal for 5 days and it started making her dizzy (less than amitriptyline).  She came into the house and tripped.   She tried to break her fall with her hand.  Both hands are hurting severely.  She hasn't been able to sleep off the amitriptyline.  She is hurting in her shoulders.  She saw a neurosurgeon 8 months ago and "a compressed something" and there was a question about FARS.  She has Parkinson's-type symptoms.  She is having tremors at baseline.  She thinks she is not a candidate for surgery - elderly, anesthesia complications, calcifications in her brain.  She has decided she wants to see someone in [place].    ER Course:  Carryover, per Dr. Tonie Griffith:     71 yo female who fell at home hitting her head. Has severe pain in bilateral arms/hands, back per report from ED. Pt with T7 compression fracture. Requiring multiple doses IV pain med in ER. Neurosurgery consulted and to see this am    Review of Systems: As mentioned in the history of present illness. All other systems reviewed and are negative. Past Medical History:  Diagnosis Date   Dementia ([place])    Diabetes mellitus without complication ([place])    Dyslipidemia    Endometrial cancer ([place]) 2021   Hypertension    Hypothyroidism (acquired)    OSA (obstructive sleep apnea)    not on CPAP    Pernicious anemia    Please see alternate chart for more details regarding PMH, SH, FH.  Social History:  reports that she has never smoked. She has never used smokeless tobacco. She reports that she does not drink alcohol and does not use drugs.  Allergies  Allergen Reactions   Acarbose Other (See Comments)   Atenolol Other (See Comments)   Atorvastatin Other (See Comments)   Canagliflozin Other (See Comments)   Crestor [Rosuvastatin] Other (See Comments)   Empagliflozin Other (See Comments)   Glimepiride Other (See Comments)   Losartan Potassium Other (See Comments)   Metformin Other (See Comments)   Other Other (See Comments)   Pioglitazone Other (See Comments)   Pravastatin Other (See Comments)   Spironolactone-Hctz Other (See Comments)    History reviewed. No pertinent family history.  Prior to Admission medications   Not on File    Physical Exam: Vitals:   01/31/22 0530 01/31/22 0545 01/31/22 0900 01/31/22 1200  BP: (!) 147/78 (!) 147/81 (!) 150/79 (!) 148/75  Pulse: 88 86 93 87  Resp: 11 13 14 13   Temp:      TempSrc:      SpO2: 100% 98% 96% 94%  Weight:       General:  Forehead hematoma, dried blood on face, hands Eyes:   EOMI, normal lids, iris ENT:  grossly normal hearing, lips & tongue, mmm; facial bruising including the upper lip Neck:  no LAD, masses or thyromegaly Cardiovascular:  RRR, no m/r/g. No LE edema.  Respiratory:   CTA bilaterally with no wheezes/rales/rhonchi.  Normal respiratory effort. Abdomen:  soft, NT, ND Skin:  no rash or induration seen on limited exam; dried blood as noted Musculoskeletal:  reports severe B hand pain but she is able to move both hands and does not have obvious deformity; feet are overflexed but can be moved into a neutral position Lower extremity:  No LE edema.  Limited foot exam with no ulcerations.  2+ distal pulses. Psychiatric:  eccentric mood and affect, speech fluent and appropriate but with some repetition in  content and evidence of mild cognitive impairment Neurologic:  CN 2-12 grossly intact, moves all extremities in coordinated fashion with significant pain of B hands at rest and with movement   Radiological Exams on Admission: Independently reviewed - see discussion in A/P where applicable  CT Head Wo Contrast  Result Date: 01/30/2022 CLINICAL DATA:  Fall EXAM: CT HEAD WITHOUT CONTRAST CT CERVICAL SPINE WITHOUT CONTRAST TECHNIQUE: Multidetector CT imaging of the head and cervical spine was performed following the standard protocol without intravenous contrast. Multiplanar CT image reconstructions of the cervical spine were also generated. RADIATION DOSE REDUCTION: This exam was performed according to the departmental dose-optimization program which includes automated exposure control, adjustment of the mA and/or kV according to patient size and/or use of iterative reconstruction technique. COMPARISON:  None. FINDINGS: CT HEAD FINDINGS Brain: There is no mass, hemorrhage or extra-axial collection. The size and configuration of the ventricles and extra-axial CSF spaces are normal. Dense mineralization in the basal ganglia and cerebellar hemispheres. Vascular: No abnormal hyperdensity of the major intracranial arteries or dural venous sinuses. No intracranial atherosclerosis. Skull: Small right frontal scalp hematoma.  No skull fracture. Sinuses/Orbits: No fluid levels or advanced mucosal thickening of the visualized paranasal sinuses. No mastoid or middle ear effusion. The orbits are normal. CT CERVICAL SPINE FINDINGS Alignment: No static subluxation. Facets are aligned. Occipital condyles are normally positioned. Skull base and vertebrae: No acute fracture. Soft tissues and spinal canal: No prevertebral fluid or swelling. No visible canal hematoma. Disc levels: No advanced spinal canal or neural foraminal stenosis. Upper chest: No pneumothorax, pulmonary nodule or pleural effusion. Other: Normal visualized  paraspinal cervical soft tissues. IMPRESSION: 1. No acute intracranial abnormality. 2. Small right frontal scalp hematoma without skull fracture. 3. No acute fracture or static subluxation of the cervical spine. Electronically Signed   By: Ulyses Jarred M.D.   On: 01/30/2022 20:46   CT Cervical Spine Wo Contrast  Result Date: 01/30/2022 CLINICAL DATA:  Fall EXAM: CT HEAD WITHOUT CONTRAST CT CERVICAL SPINE WITHOUT CONTRAST TECHNIQUE: Multidetector CT imaging of the head and cervical spine was performed following the standard protocol without intravenous contrast. Multiplanar CT image reconstructions of the cervical spine were also generated. RADIATION DOSE REDUCTION: This exam was performed according to the departmental dose-optimization program which includes automated exposure control, adjustment of the mA and/or kV according to patient size and/or use of iterative reconstruction technique. COMPARISON:  None. FINDINGS: CT HEAD FINDINGS Brain: There is no mass, hemorrhage or extra-axial collection. The size and configuration of the ventricles and extra-axial CSF spaces are normal. Dense mineralization in the basal ganglia and cerebellar hemispheres. Vascular: No abnormal hyperdensity of the major intracranial arteries or dural venous sinuses. No intracranial atherosclerosis. Skull: Small right frontal scalp hematoma.  No skull fracture. Sinuses/Orbits: No fluid levels or advanced mucosal thickening of the visualized paranasal sinuses. No  mastoid or middle ear effusion. The orbits are normal. CT CERVICAL SPINE FINDINGS Alignment: No static subluxation. Facets are aligned. Occipital condyles are normally positioned. Skull base and vertebrae: No acute fracture. Soft tissues and spinal canal: No prevertebral fluid or swelling. No visible canal hematoma. Disc levels: No advanced spinal canal or neural foraminal stenosis. Upper chest: No pneumothorax, pulmonary nodule or pleural effusion. Other: Normal visualized  paraspinal cervical soft tissues. IMPRESSION: 1. No acute intracranial abnormality. 2. Small right frontal scalp hematoma without skull fracture. 3. No acute fracture or static subluxation of the cervical spine. Electronically Signed   By: Ulyses Jarred M.D.   On: 01/30/2022 20:46   CT Thoracic Spine Wo Contrast  Result Date: 01/30/2022 CLINICAL DATA:  Acute thoracic myelopathy.  Fall EXAM: CT THORACIC SPINE WITHOUT CONTRAST TECHNIQUE: Multidetector CT images of the thoracic were obtained using the standard protocol without intravenous contrast. RADIATION DOSE REDUCTION: This exam was performed according to the departmental dose-optimization program which includes automated exposure control, adjustment of the mA and/or kV according to patient size and/or use of iterative reconstruction technique. COMPARISON:  None. FINDINGS: Alignment: Normal. Vertebrae: There is chronic compression deformity of L1. No acute vertebral abnormality. Paraspinal and other soft tissues: Calcific aortic atherosclerosis. Disc levels: There is no spinal canal stenosis. The neural foramina are widely patent. IMPRESSION: 1. No acute fracture or static subluxation of the thoracic spine. 2. Chronic compression deformity of L1. Aortic Atherosclerosis (ICD10-I70.0). Electronically Signed   By: Ulyses Jarred M.D.   On: 01/30/2022 20:36   MR Cervical Spine W or Wo Contrast  Result Date: 01/31/2022 CLINICAL DATA:  Fall EXAM: MRI CERVICAL AND THORACIC SPINE WITHOUT AND WITH CONTRAST TECHNIQUE: Multiplanar and multiecho pulse sequences of the cervical spine, to include the craniocervical junction and cervicothoracic junction, and the thoracic spine, were obtained without and with intravenous contrast. CONTRAST:  65mL GADAVIST GADOBUTROL 1 MMOL/ML IV SOLN COMPARISON:  None. FINDINGS: MRI CERVICAL SPINE FINDINGS Alignment: Grade 1 retrolisthesis at C5-6 and grade 1 anterolisthesis at C6-7 Vertebrae: No fracture, evidence of discitis, or bone  lesion. Cord: Normal signal and morphology. Posterior Fossa, vertebral arteries, paraspinal tissues: Negative. Disc levels: C1-2: Unremarkable. C2-3: Normal disc space and facet joints. There is no spinal canal stenosis. No neural foraminal stenosis. C3-4: Left uncovertebral hypertrophy. There is no spinal canal stenosis. Mild left neural foraminal stenosis. C4-5: Small disc bulge with bilateral uncovertebral hypertrophy. Mild spinal canal stenosis. Mild right and moderate left neural foraminal stenosis. C5-6: Small disc bulge with uncovertebral hypertrophy. Moderate spinal canal stenosis. Severe bilateral neural foraminal stenosis. C6-7: Small disc bulge with uncovertebral hypertrophy. There is no spinal canal stenosis. No neural foraminal stenosis. C7-T1: Normal disc space and facet joints. There is no spinal canal stenosis. No neural foraminal stenosis. MRI THORACIC SPINE FINDINGS Alignment:  Physiologic. Vertebrae: There is mild height loss at T7 with a small amount of bone marrow edema. The other thoracic vertebral bodies are normal. Cord:  Normal signal and morphology. Paraspinal and other soft tissues: Negative. Disc levels: No spinal canal stenosis. IMPRESSION: 1. Mild T7 compression fracture with a small amount of bone marrow edema, compatible with acute to subacute injury. Minimal height loss. 2. Multilevel degenerative disc disease of the cervical spine with moderate C5-6 and mild C4-5 spinal canal stenosis. 3. Severe bilateral neural foraminal stenosis at C5-6. Electronically Signed   By: Ulyses Jarred M.D.   On: 01/31/2022 02:41   MR THORACIC SPINE W WO CONTRAST  Result Date: 01/31/2022 CLINICAL  DATA:  Fall EXAM: MRI CERVICAL AND THORACIC SPINE WITHOUT AND WITH CONTRAST TECHNIQUE: Multiplanar and multiecho pulse sequences of the cervical spine, to include the craniocervical junction and cervicothoracic junction, and the thoracic spine, were obtained without and with intravenous contrast. CONTRAST:   20mL GADAVIST GADOBUTROL 1 MMOL/ML IV SOLN COMPARISON:  None. FINDINGS: MRI CERVICAL SPINE FINDINGS Alignment: Grade 1 retrolisthesis at C5-6 and grade 1 anterolisthesis at C6-7 Vertebrae: No fracture, evidence of discitis, or bone lesion. Cord: Normal signal and morphology. Posterior Fossa, vertebral arteries, paraspinal tissues: Negative. Disc levels: C1-2: Unremarkable. C2-3: Normal disc space and facet joints. There is no spinal canal stenosis. No neural foraminal stenosis. C3-4: Left uncovertebral hypertrophy. There is no spinal canal stenosis. Mild left neural foraminal stenosis. C4-5: Small disc bulge with bilateral uncovertebral hypertrophy. Mild spinal canal stenosis. Mild right and moderate left neural foraminal stenosis. C5-6: Small disc bulge with uncovertebral hypertrophy. Moderate spinal canal stenosis. Severe bilateral neural foraminal stenosis. C6-7: Small disc bulge with uncovertebral hypertrophy. There is no spinal canal stenosis. No neural foraminal stenosis. C7-T1: Normal disc space and facet joints. There is no spinal canal stenosis. No neural foraminal stenosis. MRI THORACIC SPINE FINDINGS Alignment:  Physiologic. Vertebrae: There is mild height loss at T7 with a small amount of bone marrow edema. The other thoracic vertebral bodies are normal. Cord:  Normal signal and morphology. Paraspinal and other soft tissues: Negative. Disc levels: No spinal canal stenosis. IMPRESSION: 1. Mild T7 compression fracture with a small amount of bone marrow edema, compatible with acute to subacute injury. Minimal height loss. 2. Multilevel degenerative disc disease of the cervical spine with moderate C5-6 and mild C4-5 spinal canal stenosis. 3. Severe bilateral neural foraminal stenosis at C5-6. Electronically Signed   By: Ulyses Jarred M.D.   On: 01/31/2022 02:41   DG Chest Port 1 View  Result Date: 01/31/2022 CLINICAL DATA:  Shortness of breath. EXAM: PORTABLE CHEST 1 VIEW COMPARISON:  None. FINDINGS: Mild  cardiomegaly with mild central vascular congestion. No focal consolidation, pleural effusion, or pneumothorax. Atherosclerotic calcification of the aorta. No acute osseous pathology. IMPRESSION: Mild cardiomegaly with mild central vascular congestion. No focal consolidation. Electronically Signed   By: Anner Crete M.D.   On: 01/31/2022 00:36   DG Hand Complete Left  Result Date: 01/31/2022 CLINICAL DATA:  71 year old female status post fall. EXAM: LEFT HAND - COMPLETE 3+ VIEW COMPARISON:  None. FINDINGS: Distal radius and ulna appear intact. There is abnormal widening of the scapholunate interval. But otherwise carpal bone alignment is maintained. Joint space loss and subchondral sclerosis along the radial carpal row and also at the [facility]st [facility] joint. Questionable healed 5th metacarpal fracture. Metacarpals appear intact. Diffuse distal IP joint space loss with bulky osteophytosis in keeping with osteoarthritis. No phalanx fracture or dislocation identified. No discrete soft tissue injury. Calcified peripheral vascular disease at the distal forearm and wrist. IMPRESSION: 1. Widening of the Scapholunate interval compatible with acute or chronic ligamentous injury which can predispose to SLAC wrist. 2. No acute fracture or dislocation identified about the left hand. 3. 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 4. Calcified peripheral vascular disease. Electronically Signed   By: Genevie Ann M.D.   On: 01/31/2022 06:25   DG Hand Complete Right  Result Date: 01/31/2022 CLINICAL DATA:  Status post fall with head and hand injuries. EXAM: RIGHT HAND - COMPLETE 3+ VIEW COMPARISON:  None. FINDINGS: No acute fracture or dislocation identified. There are marked degenerative changes involving  the D IP joints. Moderate basilar joint and first MCP joint degenerative change also noted. Soft tissues are unremarkable. IMPRESSION: 1. No acute findings. 2. Advanced osteoarthritis. Electronically Signed   By: Kerby Moors M.D.   On: 01/31/2022 06:24    EKG: not done   Labs on Admission: I have personally reviewed the available labs and imaging studies at the time of the admission.  Pertinent labs:    Glucose 214 BUN 16/Creatinine 1.22/GFR 47 WBC 4.2 Hgb 11 COVID/flu negative    Assessment and Plan: * Falls frequently -Patient reports underlying tremor d/o that is being evaluated by neurology -Also with chronic dizziness that seems to be exacerbated by medications -She stopped taking amitriptyline for this reason and started taking a "brain" supplement (appears to be Niacin and B6) and this caused recurrent dizziness -She also tripped over a cord, which led to the current fall -She appears to be unsafe at home but is not interested in placement -Also with unstable social supports (daughter with schizoaffective d/o, sons are having issues, her sister travels a lot, she thinks her 44yo granddaughter may be her best option for HCPOA) -Will admit with therapy evaluations and ongoing monitoring -Will request TOC team consult  Bilateral hand pain- (present on admission) -Patient with c/o B hand pain -She does report [facility] but her symptoms do not appear to be traumatic in nature -Imaging shows possible scapholunate ligamentous injury on the left with negative imaging on the right; this would like cause thumb/index finger issues but not 3-5 finger symptoms - which she is complaining of -Can try a removal wrist splint for comfort but likely chronic issue and she can do outpatient orthopedic f/u if it remains a problem (discussed with Silvestre Gunner)  Cervical stenosis of spinal canal- (present on admission) -Her primary complaint currently is severe B hand pain -MRI of the C spine indicates severe neural foraminal stenosis at C5-6 -Neurosurgery to consult -The patient states that she "is not a surgical candidate" -If surgery is recommended, she will need to consider whether she is willing to  proceed -PT/OT consults are requested to being tomorrow for now  Hypothyroidism (acquired)- (present on admission) -Continue Synthroid  Hypertension- (present on admission) -Hold Bumex for now -Will cover with prn IV hydralazine  Dyslipidemia- (present on admission) -On Repatha as an outpateint  Diabetes mellitus without complication ([place]) -Recent A1c was 7.4 -She does not appear to be taking outpatient medications -Will cover with moderate-scale SSI for now  Dementia [facility])- (present on admission) -She has apparent cognitive impairment and this was reported by neurologist recently as well -Delirium precautions ordered -Continue Wellbutrin, Restoril  Closed T7 fracture ([place])- (present on admission) -Imaging indicates a T7 compression fracture that appears to be new, as well as a chronic L1 compression fracture -She reports that the T7 fracture has been there for some time -She is not complaining of back pain currently -Neurosurgery was consulted overnight and recommended a TLSO brace -Dr. Kathyrn Sheriff will see her this AM     Advance Care Planning:   Code Status: Full Code   Consults: Neurosurgery; PT/OT/ST (cognitive evaluation); nutrition; TOC team  DVT Prophylaxis: Lovenox  Family Communication: Daughter was present throughout evaluation  Severity of Illness: The appropriate patient status for this patient is INPATIENT. Inpatient status is judged to be reasonable and necessary in order to provide the required intensity of service to ensure the patient's safety. The patient's presenting symptoms, physical exam findings, and initial radiographic and laboratory data in  the context of their chronic comorbidities is felt to place them at high risk for further clinical deterioration. Furthermore, it is not anticipated that the patient will be medically stable for discharge from the hospital within 2 midnights of admission.   * I certify that at the point of admission it is my  clinical judgment that the patient will require inpatient hospital care spanning beyond 2 midnights from the point of admission due to high intensity of service, high risk for further deterioration and high frequency of surveillance required.*  Author: Karmen Bongo, MD 01/31/2022 12:12 PM  For on call review [email or web address].

## 2022-01-31 NOTE — Assessment & Plan Note (Signed)
Continue Synthroid °

## 2022-01-31 NOTE — Assessment & Plan Note (Signed)
-  Hold Bumex for now -Will cover with prn IV hydralazine

## 2022-01-31 NOTE — Progress Notes (Signed)
Orthopedic Tech Progress Note Patient Details:  [name] May 03, 1951 [id number]  Ortho Devices Type of Ortho Device: Lumbar corsett Ortho Device/Splint Location: Back Ortho Device/Splint Interventions: Ordered, Adjustment      Timotheus Salm E Glenis Musolf 01/31/2022, 11:41 AM

## 2022-01-31 NOTE — TOC CAGE-AID Note (Signed)
Transition of Care [facility]) - CAGE-AID Screening   Patient Details  Name: [name] MRN: [id number] Date of Birth: [birth year]  Transition of Care [facility]) CM/SW Contact:    Ericha Whittingham C Tarpley-Carter, [place] Phone Number: 01/31/2022, 10:18 AM   Clinical Narrative: Pt is unable to participate in Cage Aid. Pt is experiencing dementia.  Leoni Goodness Tarpley-Carter, MSW, LCSW-A Pronouns:  She/Her/Hers [place] Transitions of Care Clinical Social Worker Direct Number:  [phone] [email or web address]  CAGE-AID Screening: Substance Abuse Screening unable to be completed due to: : Patient unable to participate             Substance Abuse Education Offered: No

## 2022-01-31 NOTE — Assessment & Plan Note (Signed)
-  Imaging indicates a T7 compression fracture that appears to be new, as well as a chronic L1 compression fracture -She reports that the T7 fracture has been there for some time -She is not complaining of back pain currently -Neurosurgery was consulted overnight and recommended a TLSO brace -Dr. Kathyrn Sheriff will see her this AM

## 2022-01-31 NOTE — Assessment & Plan Note (Addendum)
-  Her primary complaint currently is severe B hand pain -MRI of the C spine indicates severe neural foraminal stenosis at C5-6 -Neurosurgery to consult -The patient states that she "is not a surgical candidate" -If surgery is recommended, she will need to consider whether she is willing to proceed -PT/OT consults are requested to being tomorrow for now

## 2022-02-01 DIAGNOSIS — R296 Repeated falls: Secondary | ICD-10-CM | POA: Diagnosis not present

## 2022-02-01 LAB — BASIC METABOLIC PANEL
Anion gap: 10 (ref 5–15)
BUN: 12 mg/dL (ref 8–23)
CO2: 23 mmol/L (ref 22–32)
Calcium: 9.1 mg/dL (ref 8.9–10.3)
Chloride: 101 mmol/L (ref 98–111)
Creatinine, Ser: 1.01 mg/dL — ABNORMAL HIGH (ref 0.44–1.00)
GFR, Estimated: 59 mL/min — ABNORMAL LOW (ref >60)
Glucose, Bld: 210 mg/dL — ABNORMAL HIGH (ref 70–99)
Potassium: 4.1 mmol/L (ref 3.5–5.1)
Sodium: 134 mmol/L — ABNORMAL LOW (ref 135–145)

## 2022-02-01 LAB — CBC
HCT: 36.2 % (ref 36.0–46.0)
Hemoglobin: 12.3 g/dL (ref 12.0–15.0)
MCH: 30.3 pg (ref 26.0–34.0)
MCHC: 34 g/dL (ref 30.0–36.0)
MCV: 89.2 fL (ref 80.0–100.0)
Platelets: 218 10*3/uL (ref 150–400)
RBC: 4.06 MIL/uL (ref 3.87–5.11)
RDW: 14 % (ref 11.5–15.5)
WBC: 7.3 10*3/uL (ref 4.0–10.5)
nRBC: 0 % (ref 0.0–0.2)

## 2022-02-01 LAB — GLUCOSE, CAPILLARY
Glucose-Capillary: 218 mg/dL — ABNORMAL HIGH (ref 70–99)
Glucose-Capillary: 142 mg/dL — ABNORMAL HIGH (ref 70–99)
Glucose-Capillary: 187 mg/dL — ABNORMAL HIGH (ref 70–99)
Glucose-Capillary: 176 mg/dL — ABNORMAL HIGH (ref 70–99)

## 2022-02-01 MED ORDER — CALAMINE EX LOTN
1.0000 "application " | TOPICAL_LOTION | Freq: Two times a day (BID) | CUTANEOUS | Status: DC
  Administered 2022-02-05 – 2022-02-07 (×4): 1 via TOPICAL
  Filled 2022-02-01 (×2): qty 177

## 2022-02-01 MED ORDER — ADULT MULTIVITAMIN W/MINERALS CH
1.0000 | ORAL_TABLET | Freq: Every day | ORAL | Status: DC
  Administered 2022-02-01 – 2022-02-07 (×7): 1 via ORAL
  Filled 2022-02-01 (×7): qty 1

## 2022-02-01 MED ORDER — ENSURE ENLIVE PO LIQD
237.0000 mL | Freq: Two times a day (BID) | ORAL | Status: DC
  Administered 2022-02-01 – 2022-02-04 (×7): 237 mL via ORAL

## 2022-02-01 MED ORDER — LIDOCAINE 5 % EX PTCH
1.0000 | MEDICATED_PATCH | CUTANEOUS | Status: DC
  Administered 2022-02-01 – 2022-02-02 (×2): 1 via TRANSDERMAL
  Filled 2022-02-01 (×2): qty 1

## 2022-02-01 NOTE — Progress Notes (Signed)
Initial Nutrition Assessment  DOCUMENTATION CODES:  Non-severe (moderate) malnutrition in context of chronic illness  INTERVENTION:  Continue regular diet.  Obtain measured height and weight.  Add Ensure Plus High Protein po BID, each supplement provides 350 kcal and 20 grams of protein.   Add MVI with minerals daily.  Add meal ordering assistance - RD to order.  Encourage PO and supplement intake.  NUTRITION DIAGNOSIS:  Moderate Malnutrition related to chronic illness (dementia) as evidenced by moderate fat depletion, moderate muscle depletion.  GOAL:  Patient will meet greater than or equal to 90% of their needs  MONITOR:  PO intake, Supplement acceptance, Labs, Weight trends, I & O's  REASON FOR ASSESSMENT:  Malnutrition Screening Tool    ASSESSMENT:  71 yo female with a PMH of DM, HTN, dementia, and pernicious anemia presenting with a fall. Per chart she has Parkinson's-type symptoms. CT no acute abnormality, small right frontal scalp hematoma without skull fx.  Spoke with pt at bedside. Pt reports having some nausea at current, but pt also appears very confused.  Per Epic meal documentation, pt ate 100% of breakfast this morning.   Weight appears stated and no height documented. RD to order both measured.  Medications: reviewed; colace BID, SSI, Synthroid, morphine per IV PRN (given twice today), oxycodone PO PRN (given once today)  Labs: reviewed; Na 134 (L), CBG 102-218 (H)  NUTRITION - FOCUSED PHYSICAL EXAM: Flowsheet Row Most Recent Value  Orbital Region Moderate depletion  Upper Arm Region Severe depletion  Thoracic and Lumbar Region Moderate depletion  Buccal Region Moderate depletion  Temple Region Moderate depletion  Clavicle Bone Region Moderate depletion  Clavicle and Acromion Bone Region Moderate depletion  Scapular Bone Region Moderate depletion  Dorsal Hand Moderate depletion  Patellar Region Moderate depletion  Anterior Thigh Region Moderate  depletion  Posterior Calf Region Moderate depletion  Edema (RD Assessment) Mild  [Facial]  Hair Reviewed  Eyes Reviewed  Mouth Reviewed  Skin Reviewed  Nails Reviewed   Diet Order:   Diet Order             Diet regular Room service appropriate? Yes with Assist; Fluid consistency: Thin  Diet effective now                  EDUCATION NEEDS:  Education needs have been addressed  Skin:  Skin Assessment: Reviewed RN Assessment (Ecchymosis)  Last BM:  no BM documented  Height:  Ht Readings from Last 1 Encounters:  No data found for Ht   Weight:  Wt Readings from Last 1 Encounters:  01/30/22 81.6 kg   BMI:  There is no height or weight on file to calculate BMI.  Estimated Nutritional Needs:  Kcal:  1800-2000 Protein:  95-110 grams Fluid:  >1.8 L  Derrel Nip, RD, LDN (she/her/hers) Clinical Inpatient Dietitian RD Pager/After-Hours/Weekend Pager # in [place]

## 2022-02-01 NOTE — Progress Notes (Signed)
PROGRESS NOTE    [name]  JZP:[id number] DOB: Sep 19, 1951 DOA: 01/30/2022 PCP: System, Provider Not In    Brief Narrative:  [name] is a 71 y.o. female with medical history significant of DM, HTN, dementia, and pernicious anemia presenting with a fall.  She has been sick for a long time.  She went off amitriptyline for migraines and was 50% less dizzy and so wasn't falling TID.  She took some brain herbal for 5 days and it started making her dizzy (less than amitriptyline).  She came into the house and tripped.   She tried to break her fall with her hand.  Both hands are hurting severely.  She hasn't been able to sleep off the amitriptyline.  She is hurting in her shoulders.  She saw a neurosurgeon 8 months ago and "a compressed something" and there was a question about FARS.  She has Parkinson's-type symptoms.  She is having tremors at baseline.  She thinks she is not a candidate for surgery - elderly, anesthesia complications, calcifications in her brain.  She has decided she wants to see someone in [place].    Consultants:  Neurosurgery  Procedures:   Antimicrobials:      Subjective: C/o back pain. No other complaints  Objective: Vitals:   01/31/22 2345 02/01/22 0101 02/01/22 0150 02/01/22 0757  BP:  (!) 147/57 (!) 132/104 (!) 155/78  Pulse: (!) 2 90 62 89  Resp:  16 18 18   Temp:   99.3 F (37.4 C) 98.3 F (36.8 C)  TempSrc:   Oral   SpO2:  99% 99% 100%  Weight:        Intake/Output Summary (Last 24 hours) at 02/01/2022 1323 Last data filed at 02/01/2022 0900 Gross per 24 hour  Intake 240 ml  Output --  Net 240 ml   Filed Weights   01/30/22 1945  Weight: 81.6 kg    Examination: Calm, NAD Cta no w/r Reg s1/s2 no gallop Soft benign +bs No edema Aaoxox3  Mood and affect appropriate in current setting  Skin ecchymosis of forehead, lips    Data Reviewed: I have personally reviewed following labs and imaging studies  CBC: Recent Labs  Lab  01/30/22 1943 02/01/22 0408  WBC 4.2 7.3  NEUTROABS 2.5  --   HGB 11.0* 12.3  HCT 32.8* 36.2  MCV 89.9 89.2  PLT 207 480   Basic Metabolic Panel: Recent Labs  Lab 01/30/22 1943 02/01/22 0408  NA 137 134*  K 3.5 4.1  CL 105 101  CO2 23 23  GLUCOSE 214* 210*  BUN 16 12  CREATININE 1.22* 1.01*  CALCIUM 8.6* 9.1   GFR: CrCl cannot be calculated (Unknown ideal weight.). Liver Function Tests: No results for input(s): AST, ALT, ALKPHOS, BILITOT, PROT, ALBUMIN in the last 168 hours. No results for input(s): LIPASE, AMYLASE in the last 168 hours. No results for input(s): AMMONIA in the last 168 hours. Coagulation Profile: No results for input(s): INR, PROTIME in the last 168 hours. Cardiac Enzymes: No results for input(s): CKTOTAL, CKMB, CKMBINDEX, TROPONINI in the last 168 hours. BNP (last 3 results) No results for input(s): PROBNP in the last 8760 hours. HbA1C: No results for input(s): HGBA1C in the last 72 hours. CBG: Recent Labs  Lab 01/31/22 1204 01/31/22 1813 01/31/22 2129 02/01/22 0725 02/01/22 1135  GLUCAP 162* 102* 179* 218* 142*   Lipid Profile: No results for input(s): CHOL, HDL, LDLCALC, TRIG, CHOLHDL, LDLDIRECT in the last 72 hours. Thyroid Function Tests: No  results for input(s): TSH, T4TOTAL, FREET4, T3FREE, THYROIDAB in the last 72 hours. Anemia Panel: No results for input(s): VITAMINB12, FOLATE, FERRITIN, TIBC, IRON, RETICCTPCT in the last 72 hours. Sepsis Labs: No results for input(s): PROCALCITON, LATICACIDVEN in the last 168 hours.  Recent Results (from the past 240 hour(s))  Resp Panel by RT-PCR (Flu A&B, Covid) Nasopharyngeal Swab     Status: None   Collection Time: 01/30/22  9:01 PM   Specimen: Nasopharyngeal Swab; Nasopharyngeal(NP) swabs in vial transport medium  Result Value Ref Range Status   SARS Coronavirus 2 by RT PCR NEGATIVE NEGATIVE Final    Comment: (NOTE) SARS-CoV-2 target nucleic acids are NOT DETECTED.  The SARS-CoV-2 RNA  is generally detectable in upper respiratory specimens during the acute phase of infection. The lowest concentration of SARS-CoV-2 viral copies this assay can detect is 138 copies/mL. A negative result does not preclude SARS-Cov-2 infection and should not be used as the sole basis for treatment or other patient management decisions. A negative result may occur with  improper specimen collection/handling, submission of specimen other than nasopharyngeal swab, presence of viral mutation(s) within the areas targeted by this assay, and inadequate number of viral copies(<138 copies/mL). A negative result must be combined with clinical observations, patient history, and epidemiological information. The expected result is Negative.  Fact Sheet for Patients:  EntrepreneurPulse.com.au  Fact Sheet for Healthcare Providers:  IncredibleEmployment.be  This test is no t yet approved or cleared by the [place] FDA and  has been authorized for detection and/or diagnosis of SARS-CoV-2 by FDA under an Emergency Use Authorization (EUA). This EUA will remain  in effect (meaning this test can be used) for the duration of the COVID-19 declaration under Section 564(b)(1) of the Act, 21 U.S.C.section 360bbb-3(b)(1), unless the authorization is terminated  or revoked sooner.       Influenza A by PCR NEGATIVE NEGATIVE Final   Influenza B by PCR NEGATIVE NEGATIVE Final    Comment: (NOTE) The Xpert Xpress SARS-CoV-2/FLU/RSV plus assay is intended as an aid in the diagnosis of influenza from Nasopharyngeal swab specimens and should not be used as a sole basis for treatment. Nasal washings and aspirates are unacceptable for Xpert Xpress SARS-CoV-2/FLU/RSV testing.  Fact Sheet for Patients: EntrepreneurPulse.com.au  Fact Sheet for Healthcare Providers: IncredibleEmployment.be  This test is not yet approved or cleared by the  [place] FDA and has been authorized for detection and/or diagnosis of SARS-CoV-2 by FDA under an Emergency Use Authorization (EUA). This EUA will remain in effect (meaning this test can be used) for the duration of the COVID-19 declaration under Section 564(b)(1) of the Act, 21 U.S.C. section 360bbb-3(b)(1), unless the authorization is terminated or revoked.  Performed at [facility], [address] [place], [place] [address]          Radiology Studies: CT Head Wo Contrast  Result Date: 01/30/2022 CLINICAL DATA:  Fall EXAM: CT HEAD WITHOUT CONTRAST CT CERVICAL SPINE WITHOUT CONTRAST TECHNIQUE: Multidetector CT imaging of the head and cervical spine was performed following the standard protocol without intravenous contrast. Multiplanar CT image reconstructions of the cervical spine were also generated. RADIATION DOSE REDUCTION: This exam was performed according to the departmental dose-optimization program which includes automated exposure control, adjustment of the mA and/or kV according to patient size and/or use of iterative reconstruction technique. COMPARISON:  None. FINDINGS: CT HEAD FINDINGS Brain: There is no mass, hemorrhage or extra-axial collection. The size and configuration of the ventricles and extra-axial  CSF spaces are normal. Dense mineralization in the basal ganglia and cerebellar hemispheres. Vascular: No abnormal hyperdensity of the major intracranial arteries or dural venous sinuses. No intracranial atherosclerosis. Skull: Small right frontal scalp hematoma.  No skull fracture. Sinuses/Orbits: No fluid levels or advanced mucosal thickening of the visualized paranasal sinuses. No mastoid or middle ear effusion. The orbits are normal. CT CERVICAL SPINE FINDINGS Alignment: No static subluxation. Facets are aligned. Occipital condyles are normally positioned. Skull base and vertebrae: No acute fracture. Soft tissues and spinal canal: No prevertebral fluid or  swelling. No visible canal hematoma. Disc levels: No advanced spinal canal or neural foraminal stenosis. Upper chest: No pneumothorax, pulmonary nodule or pleural effusion. Other: Normal visualized paraspinal cervical soft tissues. IMPRESSION: 1. No acute intracranial abnormality. 2. Small right frontal scalp hematoma without skull fracture. 3. No acute fracture or static subluxation of the cervical spine. Electronically Signed   By: Ulyses Jarred M.D.   On: 01/30/2022 20:46   CT Cervical Spine Wo Contrast  Result Date: 01/30/2022 CLINICAL DATA:  Fall EXAM: CT HEAD WITHOUT CONTRAST CT CERVICAL SPINE WITHOUT CONTRAST TECHNIQUE: Multidetector CT imaging of the head and cervical spine was performed following the standard protocol without intravenous contrast. Multiplanar CT image reconstructions of the cervical spine were also generated. RADIATION DOSE REDUCTION: This exam was performed according to the departmental dose-optimization program which includes automated exposure control, adjustment of the mA and/or kV according to patient size and/or use of iterative reconstruction technique. COMPARISON:  None. FINDINGS: CT HEAD FINDINGS Brain: There is no mass, hemorrhage or extra-axial collection. The size and configuration of the ventricles and extra-axial CSF spaces are normal. Dense mineralization in the basal ganglia and cerebellar hemispheres. Vascular: No abnormal hyperdensity of the major intracranial arteries or dural venous sinuses. No intracranial atherosclerosis. Skull: Small right frontal scalp hematoma.  No skull fracture. Sinuses/Orbits: No fluid levels or advanced mucosal thickening of the visualized paranasal sinuses. No mastoid or middle ear effusion. The orbits are normal. CT CERVICAL SPINE FINDINGS Alignment: No static subluxation. Facets are aligned. Occipital condyles are normally positioned. Skull base and vertebrae: No acute fracture. Soft tissues and spinal canal: No prevertebral fluid or  swelling. No visible canal hematoma. Disc levels: No advanced spinal canal or neural foraminal stenosis. Upper chest: No pneumothorax, pulmonary nodule or pleural effusion. Other: Normal visualized paraspinal cervical soft tissues. IMPRESSION: 1. No acute intracranial abnormality. 2. Small right frontal scalp hematoma without skull fracture. 3. No acute fracture or static subluxation of the cervical spine. Electronically Signed   By: Ulyses Jarred M.D.   On: 01/30/2022 20:46   CT Thoracic Spine Wo Contrast  Result Date: 01/30/2022 CLINICAL DATA:  Acute thoracic myelopathy.  Fall EXAM: CT THORACIC SPINE WITHOUT CONTRAST TECHNIQUE: Multidetector CT images of the thoracic were obtained using the standard protocol without intravenous contrast. RADIATION DOSE REDUCTION: This exam was performed according to the departmental dose-optimization program which includes automated exposure control, adjustment of the mA and/or kV according to patient size and/or use of iterative reconstruction technique. COMPARISON:  None. FINDINGS: Alignment: Normal. Vertebrae: There is chronic compression deformity of L1. No acute vertebral abnormality. Paraspinal and other soft tissues: Calcific aortic atherosclerosis. Disc levels: There is no spinal canal stenosis. The neural foramina are widely patent. IMPRESSION: 1. No acute fracture or static subluxation of the thoracic spine. 2. Chronic compression deformity of L1. Aortic Atherosclerosis (ICD10-I70.0). Electronically Signed   By: Ulyses Jarred M.D.   On: 01/30/2022 20:36   MR Cervical  Spine W or Wo Contrast  Result Date: 01/31/2022 CLINICAL DATA:  Fall EXAM: MRI CERVICAL AND THORACIC SPINE WITHOUT AND WITH CONTRAST TECHNIQUE: Multiplanar and multiecho pulse sequences of the cervical spine, to include the craniocervical junction and cervicothoracic junction, and the thoracic spine, were obtained without and with intravenous contrast. CONTRAST:  73mL GADAVIST GADOBUTROL 1 MMOL/ML IV  SOLN COMPARISON:  None. FINDINGS: MRI CERVICAL SPINE FINDINGS Alignment: Grade 1 retrolisthesis at C5-6 and grade 1 anterolisthesis at C6-7 Vertebrae: No fracture, evidence of discitis, or bone lesion. Cord: Normal signal and morphology. Posterior Fossa, vertebral arteries, paraspinal tissues: Negative. Disc levels: C1-2: Unremarkable. C2-3: Normal disc space and facet joints. There is no spinal canal stenosis. No neural foraminal stenosis. C3-4: Left uncovertebral hypertrophy. There is no spinal canal stenosis. Mild left neural foraminal stenosis. C4-5: Small disc bulge with bilateral uncovertebral hypertrophy. Mild spinal canal stenosis. Mild right and moderate left neural foraminal stenosis. C5-6: Small disc bulge with uncovertebral hypertrophy. Moderate spinal canal stenosis. Severe bilateral neural foraminal stenosis. C6-7: Small disc bulge with uncovertebral hypertrophy. There is no spinal canal stenosis. No neural foraminal stenosis. C7-T1: Normal disc space and facet joints. There is no spinal canal stenosis. No neural foraminal stenosis. MRI THORACIC SPINE FINDINGS Alignment:  Physiologic. Vertebrae: There is mild height loss at T7 with a small amount of bone marrow edema. The other thoracic vertebral bodies are normal. Cord:  Normal signal and morphology. Paraspinal and other soft tissues: Negative. Disc levels: No spinal canal stenosis. IMPRESSION: 1. Mild T7 compression fracture with a small amount of bone marrow edema, compatible with acute to subacute injury. Minimal height loss. 2. Multilevel degenerative disc disease of the cervical spine with moderate C5-6 and mild C4-5 spinal canal stenosis. 3. Severe bilateral neural foraminal stenosis at C5-6. Electronically Signed   By: Ulyses Jarred M.D.   On: 01/31/2022 02:41   MR THORACIC SPINE W WO CONTRAST  Result Date: 01/31/2022 CLINICAL DATA:  Fall EXAM: MRI CERVICAL AND THORACIC SPINE WITHOUT AND WITH CONTRAST TECHNIQUE: Multiplanar and multiecho  pulse sequences of the cervical spine, to include the craniocervical junction and cervicothoracic junction, and the thoracic spine, were obtained without and with intravenous contrast. CONTRAST:  74mL GADAVIST GADOBUTROL 1 MMOL/ML IV SOLN COMPARISON:  None. FINDINGS: MRI CERVICAL SPINE FINDINGS Alignment: Grade 1 retrolisthesis at C5-6 and grade 1 anterolisthesis at C6-7 Vertebrae: No fracture, evidence of discitis, or bone lesion. Cord: Normal signal and morphology. Posterior Fossa, vertebral arteries, paraspinal tissues: Negative. Disc levels: C1-2: Unremarkable. C2-3: Normal disc space and facet joints. There is no spinal canal stenosis. No neural foraminal stenosis. C3-4: Left uncovertebral hypertrophy. There is no spinal canal stenosis. Mild left neural foraminal stenosis. C4-5: Small disc bulge with bilateral uncovertebral hypertrophy. Mild spinal canal stenosis. Mild right and moderate left neural foraminal stenosis. C5-6: Small disc bulge with uncovertebral hypertrophy. Moderate spinal canal stenosis. Severe bilateral neural foraminal stenosis. C6-7: Small disc bulge with uncovertebral hypertrophy. There is no spinal canal stenosis. No neural foraminal stenosis. C7-T1: Normal disc space and facet joints. There is no spinal canal stenosis. No neural foraminal stenosis. MRI THORACIC SPINE FINDINGS Alignment:  Physiologic. Vertebrae: There is mild height loss at T7 with a small amount of bone marrow edema. The other thoracic vertebral bodies are normal. Cord:  Normal signal and morphology. Paraspinal and other soft tissues: Negative. Disc levels: No spinal canal stenosis. IMPRESSION: 1. Mild T7 compression fracture with a small amount of bone marrow edema, compatible with acute to subacute injury.  Minimal height loss. 2. Multilevel degenerative disc disease of the cervical spine with moderate C5-6 and mild C4-5 spinal canal stenosis. 3. Severe bilateral neural foraminal stenosis at C5-6. Electronically Signed    By: Ulyses Jarred M.D.   On: 01/31/2022 02:41   DG Chest Port 1 View  Result Date: 01/31/2022 CLINICAL DATA:  Shortness of breath. EXAM: PORTABLE CHEST 1 VIEW COMPARISON:  None. FINDINGS: Mild cardiomegaly with mild central vascular congestion. No focal consolidation, pleural effusion, or pneumothorax. Atherosclerotic calcification of the aorta. No acute osseous pathology. IMPRESSION: Mild cardiomegaly with mild central vascular congestion. No focal consolidation. Electronically Signed   By: Anner Crete M.D.   On: 01/31/2022 00:36   DG Hand Complete Left  Result Date: 01/31/2022 CLINICAL DATA:  71 year old female status post fall. EXAM: LEFT HAND - COMPLETE 3+ VIEW COMPARISON:  None. FINDINGS: Distal radius and ulna appear intact. There is abnormal widening of the scapholunate interval. But otherwise carpal bone alignment is maintained. Joint space loss and subchondral sclerosis along the radial carpal row and also at the 1st [facility] joint. Questionable healed 5th metacarpal fracture. Metacarpals appear intact. Diffuse distal IP joint space loss with bulky osteophytosis in keeping with osteoarthritis. No phalanx fracture or dislocation identified. No discrete soft tissue injury. Calcified peripheral vascular disease at the distal forearm and wrist. IMPRESSION: 1. Widening of the Scapholunate interval compatible with acute or chronic ligamentous injury which can predispose to SLAC wrist. 2. No acute fracture or dislocation identified about the left hand. 3. 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 4. Calcified peripheral vascular disease. Electronically Signed   By: Genevie Ann M.D.   On: 01/31/2022 06:25   DG Hand Complete Right  Result Date: 01/31/2022 CLINICAL DATA:  Status post fall with head and hand injuries. EXAM: RIGHT HAND - COMPLETE 3+ VIEW COMPARISON:  None. FINDINGS: No acute fracture or dislocation identified. There are marked degenerative changes involving the D IP joints.  Moderate basilar joint and first MCP joint degenerative change also noted. Soft tissues are unremarkable. IMPRESSION: 1. No acute findings. 2. Advanced osteoarthritis. Electronically Signed   By: Kerby Moors M.D.   On: 01/31/2022 06:24        Scheduled Meds:  aspirin  325 mg Oral Daily   buPROPion  300 mg Oral Daily   docusate sodium  100 mg Oral BID   enoxaparin (LOVENOX) injection  40 mg Subcutaneous Daily   insulin aspart  0-15 Units Subcutaneous TID WC   insulin aspart  0-5 Units Subcutaneous QHS   levothyroxine  50 mcg Oral q morning   sodium chloride flush  3 mL Intravenous Q12H   Continuous Infusions:  methocarbamol (ROBAXIN) IV      Assessment & Plan:   Principal Problem:   Falls frequently Active Problems:   Closed T7 fracture (HCC)   Dementia (HCC)   Diabetes mellitus without complication (HCC)   Dyslipidemia   Hypertension   Hypothyroidism (acquired)   Cervical stenosis of spinal canal   Bilateral hand pain   Falls frequently -Patient reports underlying tremor d/o that is being evaluated by neurology -Also with chronic dizziness that seems to be exacerbated by medications (amitiptyline) 2/16 PT/OT TOC consulted    Cervical stenosis of spinal canal- (present on admission) PT OT Neurosurgery following    Closed T7 fracture ([place])- (present on admission) TLSO brace for comfort Per neurosurgery T7 fracture is currently not unstable Pain control Can consider steroids and gabapentin teen Follow-up neurosurgery as  outpatient in few weeks Lidocaine patch Will start gabapentin    Hypothyroidism (acquired)- (present on admission) -Continue Synthroid    Bilateral hand pain- (present on admission) -Patient with c/o B hand pain -She does report [facility] but her symptoms do not appear to be traumatic in nature -Imaging shows possible scapholunate ligamentous injury on the left with negative imaging on the right; this would like cause thumb/index finger  issues but not 3-5 finger symptoms - which she is complaining of -Can try a removal wrist splint for comfort but likely chronic issue and she can do outpatient orthopedic f/u if it remains a problem (discussed with Silvestre Gunner)      Hypertension- (present on admission) -Hold Bumex for now -Will cover with prn IV hydralazine   Dyslipidemia- (present on admission) -On Repatha as an outpateint   Diabetes mellitus without complication ([place]) -Recent A1c was 7.4 -She does not appear to be taking outpatient medications -Will cover with moderate-scale SSI for now   Dementia [facility]- (present on admission) -She has apparent cognitive impairment and this was reported by neurologist recently as well -Delirium precautions ordered -Continue Wellbutrin, Restoril            DVT prophylaxis: Lovenox Code Status:full Family Communication: Granddaughter at bedside Disposition Plan:  Status is: Inpatient Remains inpatient appropriate because: iv treatment. Pain control. Inability to walk                LOS: 1 day   Time spent: 35 min    Nolberto Hanlon, MD [facility] Pager [phone]  If 7PM-7AM, please contact night-coverage 02/01/2022, 1:23 PM

## 2022-02-01 NOTE — Progress Notes (Signed)
Physical Therapy Evaluation Patient Details Name: [name] MRN: [id number] DOB: 07-30-1951 Today's Date: 02/01/2022  History of Present Illness  This 71 y.o. female admitted after falling over a cord and striking the top of her head on 2/14.   MRI of the thoracic spine reveals likely acute to subacute superior endplate fracture at T7 without significant loss of height.  There is also chronic compression fracture at L1. CT of head negative for acute abnormality.  X-ray of hand shows possible scapholunate ligamentous injury on the left (likely chronic) with negative imaging Rt hand.  MRI of C-Spine shows severe neural foraminal stenosis C5-6 (NS following).  PMH includes:  HTN, DM, dementia, h/o frequent falls, migraine, h/o dizziness. parkinson's like tremor - followed by neurology OP  Clinical Impression  Pt is demonstrating a struggle to stand and take a few sidesteps, to don her TLSO and to get into and out of bed.  In consideration of her previously independent life, will recommend her to go to CIR since she has family assistance potentially to get home, and is demonstrating both cognitive and physical issues of her injury that will lend themselves to a more intensive therapy protocol.  Pt is verbally agreeing to inpt care, and will focus on improving her control of standing and walking to give her more advantage to making fast progress in that intensive environment.  Follow for acute PT goals as outlined below.     Recommendations for follow up therapy are one component of a multi-disciplinary discharge planning process, led by the attending physician.  Recommendations may be updated based on patient status, additional functional criteria and insurance authorization.  Follow Up Recommendations Acute inpatient rehab (3hours/day)    Assistance Recommended at Discharge Frequent or constant Supervision/Assistance  Patient can return home with the following  Two people to help with walking  and/or transfers;Two people to help with bathing/dressing/bathroom;Assistance with cooking/housework;Assist for transportation;Help with stairs or ramp for entrance    Equipment Recommendations None recommended by PT  Recommendations for Other Services  Rehab consult    Functional Status Assessment Patient has had a recent decline in their functional status and demonstrates the ability to make significant improvements in function in a reasonable and predictable amount of time.     Precautions / Restrictions Precautions Precautions: Back;Fall Precaution Booklet Issued: No Precaution Comments: verbally reviewed, pt distracted Required Braces or Orthoses: Spinal Brace Spinal Brace: Thoracolumbosacral orthotic Restrictions Weight Bearing Restrictions: No      Mobility  Bed Mobility Overal bed mobility: Needs Assistance Bed Mobility: Rolling, Sidelying to Sit, Sit to Sidelying Rolling: Min guard, Min assist Sidelying to sit: Min assist     Sit to sidelying: Mod assist General bed mobility comments: verbally instructed along with physical help to maintain spinal precautions before getting on side of bed    Transfers Overall transfer level: Needs assistance Equipment used: Rolling walker (2 wheels) Transfers: Sit to/from Stand Sit to Stand: Mod assist           General transfer comment: declined to stay OOB in chair due to wanting to bathe before staying OOB    Ambulation/Gait Ambulation/Gait assistance: Min assist Gait Distance (Feet): 5 Feet Assistive device: Rolling walker (2 wheels), 1 person hand held assist Gait Pattern/deviations: Step-to pattern, Decreased stride length, Shuffle Gait velocity: reduced Gait velocity interpretation: <1.31 ft/sec, indicative of household ambulator Pre-gait activities: standing balance cues General Gait Details: cued pt to slide walker and avoid lifting it  Stairs  Wheelchair Mobility    Modified Rankin (Stroke  Patients Only)       Balance Overall balance assessment: Needs assistance Sitting-balance support: Feet supported Sitting balance-Leahy Scale: Good     Standing balance support: During functional activity, Bilateral upper extremity supported Standing balance-Leahy Scale: Poor Standing balance comment: RW and min assist to maintain standing balance                             Pertinent Vitals/Pain Pain Assessment Pain Assessment: Faces Faces Pain Scale: Hurts whole lot Pain Location: neck, back, head, hands Pain Descriptors / Indicators: Burning, Grimacing, Guarding Pain Intervention(s): Limited activity within patient's tolerance, Monitored during session, Premedicated before session, Repositioned, Patient requesting pain meds-RN notified, RN gave pain meds during session    Home Living Family/patient expects to be discharged to:: Private residence Living Arrangements: Alone;Children Available Help at Discharge: Family;Available PRN/intermittently Type of Home: House Home Access: Stairs to enter   [employer] of Steps: 2   Home Layout: One level   Additional Comments: pt is struggling with some history details, distracted by her neck and back pain, as well as post head pain    Prior Function Prior Level of Function : Independent/Modified Independent;Driving             Mobility Comments: walked with no AD but has had some falls ADLs Comments: Pt reports she performs ADLs and IADLs including driving and grocery shopping     Hand Dominance   Dominant Hand: Right    Extremity/Trunk Assessment   Upper Extremity Assessment Upper Extremity Assessment: Defer to OT evaluation RUE Deficits / Details: full ROM.  Decreased active flexion of digits 2 and 3.  Tremor noted.  Arthritic deformity noted RUE Sensation: decreased light touch RUE Coordination: decreased fine motor;decreased gross motor LUE Deficits / Details: Full AROM.  tremor noted.   arthritic deformity noted LUE Sensation: decreased light touch LUE Coordination: decreased fine motor;decreased gross motor    Lower Extremity Assessment Lower Extremity Assessment: Generalized weakness    Cervical / Trunk Assessment Cervical / Trunk Assessment: Kyphotic  Communication   Communication: No difficulties  Cognition Arousal/Alertness: Awake/alert Behavior During Therapy: WFL for tasks assessed/performed Overall Cognitive Status: No family/caregiver present to determine baseline cognitive functioning                                 General Comments: pt is quickly and easily distracted, may be combination of pain and dementia        General Comments General comments (skin integrity, edema, etc.): Pt was seen for progressing gait and transfers to stand and sidestep, has difficulty using B hands, with greater weak grip on R as compared to L hand    Exercises     Assessment/Plan    PT Assessment Patient needs continued PT services  PT Problem List Decreased strength;Decreased range of motion;Decreased activity tolerance;Decreased balance;Decreased mobility;Decreased coordination;Decreased cognition;Decreased knowledge of use of DME;Decreased safety awareness;Cardiopulmonary status limiting activity;Decreased skin integrity;Pain       PT Treatment Interventions DME instruction;Gait training;Stair training;Functional mobility training;Therapeutic activities;Therapeutic exercise;Balance training;Neuromuscular re-education;Patient/family education    PT Goals (Current goals can be found in the Care Plan section)  Acute Rehab PT Goals Patient Stated Goal: to get pain gone PT Goal Formulation: With patient Time For Goal Achievement: 02/15/22 Potential to Achieve Goals: Good    Frequency Min 3X/week  Co-evaluation               AM-PAC PT "6 Clicks" Mobility  Outcome Measure Help needed turning from your back to your side while in a flat bed  without using bedrails?: A Little Help needed moving from lying on your back to sitting on the side of a flat bed without using bedrails?: A Lot Help needed moving to and from a bed to a chair (including a wheelchair)?: A Lot Help needed standing up from a chair using your arms (e.g., wheelchair or bedside chair)?: A Lot Help needed to walk in hospital room?: A Lot Help needed climbing 3-5 steps with a railing? : Total 6 Click Score: 12    End of Session Equipment Utilized During Treatment: Gait belt Activity Tolerance: Patient tolerated treatment well;Patient limited by fatigue;Patient limited by lethargy;Patient limited by pain;Treatment limited secondary to medical complications (Comment) Patient left: in bed;with call bell/phone within reach;with bed alarm set (at her request, nursing aware pt wants to do a bath before sitting OOB) Nurse Communication: Mobility status;Other (comment) (limits of pain and mobility) PT Visit Diagnosis: Unsteadiness on feet (R26.81);Muscle weakness (generalized) (M62.81);Adult, failure to thrive (R62.7);Difficulty in walking, not elsewhere classified (R26.2)    Time: 5790-3833 PT Time Calculation (min) (ACUTE ONLY): 32 min   Charges:   PT Evaluation $PT Eval Moderate Complexity: 1 Mod PT Treatments $Therapeutic Activity: 8-22 mins      Ramond Dial 02/01/2022, 5:11 PM  Mee Hives, PT PhD Acute Rehab Dept. Number: [place] and [place]

## 2022-02-01 NOTE — Progress Notes (Signed)
Patient got up OOB without assistance or using her call bell.  Bed alarm went off and staff went in and assisted patient to the bathroom.  She voided in the bathroom.  Patient was assisted back to bed and reminded to please call staff before getting up.

## 2022-02-01 NOTE — Evaluation (Signed)
Occupational Therapy Evaluation Patient Details Name: [name] MRN: [id number] DOB: 1951-02-14 Today's Date: 02/01/2022   History of Present Illness This 71 y.o. female admitted after falling over a cord and striking the top of her head.   MRI of the thoracic spine reveals likely acute to subacute superior endplate fracture at T7 without significant loss of height.  There is also chronic compression fracture at L1. CT of head negative for acutre abnormality.  X-ray of hand shows possible scapholunate ligamentous injury on the left (likely chronic) with negative imaging Rt hand.  MRI of C-Spine shose severe neural foraminal stenosis C5-6 (NS following).  PMH includes:  HTN, DM, dementia, h/o frequent falls, migraine, h/o dizziness. parkinson's like tremor - followed by neurology OP   Clinical Impression   Pt admitted with above. She demonstrates the below listed deficits and will benefit from continued OT to maximize safety and independence with BADLs.  Pt presents to OT with generalized weakness, decreased balance, decreased safety awareness, increased pain head, neck, back, shoulders, and hands, impaired cognition.  Pt currently requires min A - mod A for UB ADLs, and mod A - max A for LB ADLs.  She requires min A for ambulation in room.  She reports she lives alone, but that her daughter and grand daughter stay with her at times also.  She reports she was independent with ADLs, driving and IADLs, but does have a h/o frequent falls.        Recommendations for follow up therapy are one component of a multi-disciplinary discharge planning process, led by the attending physician.  Recommendations may be updated based on patient status, additional functional criteria and insurance authorization.   Follow Up Recommendations  Acute inpatient rehab (3hours/day)    Assistance Recommended at Discharge Frequent or constant Supervision/Assistance  Patient can return home with the following A lot of  help with walking and/or transfers;A lot of help with bathing/dressing/bathroom;Assistance with cooking/housework;Direct supervision/assist for medications management;Direct supervision/assist for financial management;Assist for transportation;Help with stairs or ramp for entrance    Functional Status Assessment  Patient has had a recent decline in their functional status and demonstrates the ability to make significant improvements in function in a reasonable and predictable amount of time.  Equipment Recommendations  None recommended by OT    Recommendations for Other Services       Precautions / Restrictions Precautions Precautions: Back;Fall Precaution Booklet Issued: No Precaution Comments: Pt instructed in back precautions Required Braces or Orthoses: Spinal Brace Spinal Brace: Thoracolumbosacral orthotic (for comfort)      Mobility Bed Mobility Overal bed mobility: Needs Assistance Bed Mobility: Rolling, Sidelying to Sit, Sit to Sidelying Rolling: Min guard Sidelying to sit: Min assist     Sit to sidelying: Mod assist General bed mobility comments: assist to lift trunk and assist to lift LEs back onto bed    Transfers Overall transfer level: Needs assistance Equipment used: Rolling walker (2 wheels) Transfers: Sit to/from Stand, Bed to chair/wheelchair/BSC Sit to Stand: Mod assist     Step pivot transfers: Min assist            Balance Overall balance assessment: Needs assistance Sitting-balance support: No upper extremity supported Sitting balance-Leahy Scale: Good     Standing balance support: During functional activity Standing balance-Leahy Scale: Poor Standing balance comment: requires UE support or min A for static standing  ADL either performed or assessed with clinical judgement   ADL Overall ADL's : Needs assistance/impaired Eating/Feeding: Set up;Bed level   Grooming: Wash/dry hands;Wash/dry face;Minimal  assistance;Standing   Upper Body Bathing: Moderate assistance;Sitting   Lower Body Bathing: Moderate assistance;Sit to/from stand   Upper Body Dressing : Moderate assistance;Sitting   Lower Body Dressing: Moderate assistance;Sit to/from stand Lower Body Dressing Details (indicate cue type and reason): able to perform figure 4.  Donned socks with min A Toilet Transfer: Minimal assistance;Ambulation;Comfort height toilet;Rolling walker (2 wheels)   Toileting- Clothing Manipulation and Hygiene: Maximal assistance;Sit to/from stand       Functional mobility during ADLs: Minimal assistance;Rolling walker (2 wheels)       Vision Patient Visual Report: No change from baseline       Perception     Praxis      Pertinent Vitals/Pain Pain Assessment Pain Assessment: Faces Faces Pain Scale: Hurts whole lot Pain Location: neck, back, head, hands Pain Descriptors / Indicators: Aching, Burning, Constant, Grimacing, Guarding, Headache, Pins and needles, Shooting, Restless Pain Intervention(s): Repositioned, Monitored during session, Limited activity within patient's tolerance, Patient requesting pain meds-RN notified     Hand Dominance Right   Extremity/Trunk Assessment Upper Extremity Assessment Upper Extremity Assessment: RUE deficits/detail;LUE deficits/detail;Generalized weakness RUE Deficits / Details: full ROM.  Decreased active flexion of digits 2 and 3.  Tremor noted.  Arthritic deformity noted RUE Sensation: decreased light touch RUE Coordination: decreased fine motor;decreased gross motor LUE Deficits / Details: Full AROM.  tremor noted.  arthritic deformity noted LUE Sensation: decreased light touch LUE Coordination: decreased fine motor;decreased gross motor   Lower Extremity Assessment Lower Extremity Assessment: Defer to PT evaluation   Cervical / Trunk Assessment Cervical / Trunk Assessment: Kyphotic   Communication Communication Communication: No difficulties    Cognition Arousal/Alertness: Awake/alert Behavior During Therapy: WFL for tasks assessed/performed Overall Cognitive Status: No family/caregiver present to determine baseline cognitive functioning                                 General Comments: Pt with h/o dementia.  No family present.  Pt requires cues for sequencing during ADL tasks, and demonstrates poor safety awareness     General Comments       Exercises     Shoulder Instructions      Home Living Family/patient expects to be discharged to:: Private residence Living Arrangements: Alone;Children Available Help at Discharge: Family;Available PRN/intermittently Type of Home: House Home Access: Stairs to enter [employer] of Steps: 2   Home Layout: One level     Bathroom Shower/Tub: Teacher, early years/pre: Standard         Additional Comments: Pt unable to provide accurate info re: DME at home - provided contradictory info.  She reports her daughter and grand daughter sometimes live with her when they aren't living at their apartment      Prior Functioning/Environment Prior Level of Function : Independent/Modified Independent;Driving             Mobility Comments: Pt reports she ambulates, but has frequent falls and frequent dizziness ADLs Comments: Pt reports she performs ADLs and IADLs including driving and grocery shopping        OT Problem List: Decreased strength;Decreased range of motion;Decreased activity tolerance;Impaired balance (sitting and/or standing);Decreased coordination;Decreased cognition;Decreased safety awareness;Decreased knowledge of use of DME or AE;Decreased knowledge of precautions;Impaired UE functional use;Obesity;Impaired sensation;Pain  OT Treatment/Interventions: Self-care/ADL training;Therapeutic exercise;Neuromuscular education;DME and/or AE instruction;Therapeutic activities;Cognitive remediation/compensation;Patient/family  education;Balance training    OT Goals(Current goals can be found in the care plan section) Acute Rehab OT Goals Patient Stated Goal: to have less pain OT Goal Formulation: With patient Time For Goal Achievement: 02/15/22 Potential to Achieve Goals: Good ADL Goals Pt Will Perform Grooming: with min guard assist;standing Pt Will Perform Upper Body Bathing: with set-up;with supervision;sitting Pt Will Perform Lower Body Bathing: with min guard assist;sit to/from stand Pt Will Perform Upper Body Dressing: with set-up;with supervision;sitting Pt Will Perform Lower Body Dressing: with min guard assist;sit to/from stand Pt Will Transfer to Toilet: with min guard assist;ambulating;grab bars;bedside commode;regular height toilet Pt Will Perform Toileting - Clothing Manipulation and hygiene: with min guard assist;sit to/from stand  OT Frequency: Min 2X/week    Co-evaluation              AM-PAC OT "6 Clicks" Daily Activity     Outcome Measure Help from another person eating meals?: A Little Help from another person taking care of personal grooming?: A Lot Help from another person toileting, which includes using toliet, bedpan, or urinal?: A Lot Help from another person bathing (including washing, rinsing, drying)?: A Lot Help from another person to put on and taking off regular upper body clothing?: A Lot Help from another person to put on and taking off regular lower body clothing?: A Lot 6 Click Score: 13   End of Session Equipment Utilized During Treatment: Gait belt;Rolling walker (2 wheels);Back brace Nurse Communication: Mobility status;Precautions;Patient requests pain meds  Activity Tolerance: Patient limited by pain Patient left: in bed;with call bell/phone within reach;with bed alarm set  OT Visit Diagnosis: Unsteadiness on feet (R26.81);Cognitive communication deficit (R41.841);Pain;Repeated falls (R29.6) Pain - part of body: Shoulder;Hand (back, head and neck)                 Time: 2355-7322 OT Time Calculation (min): 32 min Charges:  OT General Charges $OT Visit: 1 Visit OT Evaluation $OT Eval Moderate Complexity: 1 Mod OT Treatments $Self Care/Home Management : 8-22 mins  Nilsa Nutting., OTR/L Acute Rehabilitation Services Pager [phone] Office [phone]   Lucille Passy M 02/01/2022, 4:00 PM

## 2022-02-01 NOTE — Evaluation (Signed)
Speech Language Pathology Evaluation Patient Details Name: [name] MRN: [id number] DOB: 1951/01/10 Today's Date: 02/01/2022 Time: 8850-2774 SLP Time Calculation (min) (ACUTE ONLY): 15 min  Problem List:  Patient Active Problem List   Diagnosis Date Noted   Closed T7 fracture ([place]) 01/31/2022   Dementia ([place]) 01/31/2022   Diabetes mellitus without complication ([place]) 12/87/8676   Dyslipidemia 01/31/2022   Hypertension 01/31/2022   Hypothyroidism (acquired) 01/31/2022   Cervical stenosis of spinal canal 01/31/2022   Falls frequently 01/31/2022   Bilateral hand pain 01/31/2022   Past Medical History:  Past Medical History:  Diagnosis Date   Dementia ([place])    Diabetes mellitus without complication ([place])    Dyslipidemia    Endometrial cancer ([place]) 2021   Hypertension    Hypothyroidism (acquired)    OSA (obstructive sleep apnea)    not on CPAP   Pernicious anemia    Past Surgical History: History reviewed. No pertinent surgical history. HPI:  [name] is a 71 y.o. female with medical history significant of DM, HTN, dementia, and pernicious anemia presenting with a fall. Per chart she has Parkinson's-type symptoms. CT no acute abnormality, small right frontal scalp hematoma without skull fx.   Assessment / Plan / Recommendation Clinical Impression  Pt assessed for speech-language-cognitive abilities with mild cognitive impairments on the Cognistat. She demonstrated adequate emergent awareness and stated she was confused and that she was taking pain meds. Anticipatory awareness is questionable. Her ability to retrieve information is decreased on 4 word recall subtest. Mental calculations were a challenge for her at present. She is currently unemployed and stated that her daughter lives with her "sometimes" and that she is independent with medication and financial management. She will be followed while inpatient and recommend home health at discharge unless she clears before  then.    SLP Assessment       Recommendations for follow up therapy are one component of a multi-disciplinary discharge planning process, led by the attending physician.  Recommendations may be updated based on patient status, additional functional criteria and insurance authorization.    Follow Up Recommendations  Home health SLP    Assistance Recommended at Discharge     Functional Status Assessment Patient has had a recent decline in their functional status and demonstrates the ability to make significant improvements in function in a reasonable and predictable amount of time.  Frequency and Duration min 2x/week  2 weeks      SLP Evaluation Cognition  Overall Cognitive Status: Impaired/Different from baseline Arousal/Alertness: Awake/alert Orientation Level: Oriented to person;Oriented to place;Oriented to situation;Disoriented to time Year:  ("19") Month: February Day of Week: Incorrect Attention: Sustained Sustained Attention: Impaired Sustained Attention Impairment: Verbal basic Memory: Impaired Memory Impairment: Storage deficit;Retrieval deficit Awareness: Impaired Awareness Impairment: Anticipatory impairment Problem Solving: Impaired Problem Solving Impairment: Verbal basic Safety/Judgment: Impaired       Comprehension  Auditory Comprehension Overall Auditory Comprehension: Appears within functional limits for tasks assessed Visual Recognition/Discrimination Discrimination: Not tested Reading Comprehension Reading Status: Not tested    Expression Expression Primary Mode of Expression: Verbal Verbal Expression Overall Verbal Expression: Appears within functional limits for tasks assessed Pragmatics: No impairment Written Expression Written Expression: Not tested   Oral / Motor  Oral Motor/Sensory Function Overall Oral Motor/Sensory Function: Within functional limits Motor Speech Overall Motor Speech: Appears within functional limits for tasks  assessed Intelligibility: Intelligible            Houston Siren 02/01/2022, 11:53 AM Orbie Pyo Colvin Caroli.Ed  Actor Pager [phone] Office [phone]
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
## 2022-02-02 DIAGNOSIS — R296 Repeated falls: Secondary | ICD-10-CM | POA: Diagnosis not present

## 2022-02-02 LAB — GLUCOSE, CAPILLARY
Glucose-Capillary: 201 mg/dL — ABNORMAL HIGH (ref 70–99)
Glucose-Capillary: 189 mg/dL — ABNORMAL HIGH (ref 70–99)
Glucose-Capillary: 276 mg/dL — ABNORMAL HIGH (ref 70–99)
Glucose-Capillary: 153 mg/dL — ABNORMAL HIGH (ref 70–99)
Glucose-Capillary: 145 mg/dL — ABNORMAL HIGH (ref 70–99)

## 2022-02-02 MED ORDER — GABAPENTIN 100 MG PO CAPS
100.0000 mg | ORAL_CAPSULE | Freq: Every day | ORAL | Status: DC
  Administered 2022-02-02: 100 mg via ORAL
  Filled 2022-02-02: qty 1

## 2022-02-02 NOTE — Progress Notes (Signed)
PROGRESS NOTE    Isabella Fayson  HQP:[id number] DOB: [birth year] DOA: 01/30/2022 PCP: System, Provider Not In    Brief Narrative:  [name] is a 71 y.o. female with medical history significant of DM, HTN, dementia, and pernicious anemia presenting with a fall.  She has been sick for a long time.  She went off amitriptyline for migraines and was 50% less dizzy and so wasn't falling TID.  She took some brain herbal for 5 days and it started making her dizzy (less than amitriptyline).  She came into the house and tripped.   She tried to break her fall with her hand.  Both hands are hurting severely.  She hasn't been able to sleep off the amitriptyline.  She is hurting in her shoulders.  She saw a neurosurgeon 8 months ago and "a compressed something" and there was a question about FARS.  She has Parkinson's-type symptoms.  She is having tremors at baseline.  She thinks she is not a candidate for surgery - elderly, anesthesia complications, calcifications in her brain.  She has decided she wants to see someone in [place].  2/17 pain is better control but still has room for improvement discussed gabapentin. She is agreeable to try it, if has any reaction ,will then stop. Will start with low dose at night .  Consultants:  Neurosurgery  Procedures:   Antimicrobials:      Subjective: No chest pain or shortness of breath  Objective: Vitals:   02/01/22 2006 02/02/22 0626 02/02/22 0740 02/02/22 1428  BP: 135/79 (!) 150/78 (!) 156/84 125/67  Pulse: (!) 105 (!) 101 97 97  Resp: 16 16 18 18   Temp: 97.8 F (36.6 C) 97.7 F (36.5 C) 97.9 F (36.6 C) 98.4 F (36.9 C)  TempSrc: Oral Axillary Oral Oral  SpO2: 97% 96% 100% 100%  Weight:      Height:        Intake/Output Summary (Last 24 hours) at 02/02/2022 1800 Last data filed at 02/02/2022 1300 Gross per 24 hour  Intake 240 ml  Output --  Net 240 ml   Filed Weights   01/30/22 1945 02/01/22 1439  Weight: 81.6 kg 79 kg     Examination: Calm, NAD Cta no w/r Reg s1/s2 no gallop Soft benign +bs No edema Aaoxox3  Mood and affect appropriate in current setting     Data Reviewed: I have personally reviewed following labs and imaging studies  CBC: Recent Labs  Lab 01/30/22 1943 02/01/22 0408  WBC 4.2 7.3  NEUTROABS 2.5  --   HGB 11.0* 12.3  HCT 32.8* 36.2  MCV 89.9 89.2  PLT 207 599   Basic Metabolic Panel: Recent Labs  Lab 01/30/22 1943 02/01/22 0408  NA 137 134*  K 3.5 4.1  CL 105 101  CO2 23 23  GLUCOSE 214* 210*  BUN 16 12  CREATININE 1.22* 1.01*  CALCIUM 8.6* 9.1   GFR: Estimated Creatinine Clearance: 49.6 mL/min (A) (by C-G formula based on SCr of 1.01 mg/dL (H)). Liver Function Tests: No results for input(s): AST, ALT, ALKPHOS, BILITOT, PROT, ALBUMIN in the last 168 hours. No results for input(s): LIPASE, AMYLASE in the last 168 hours. No results for input(s): AMMONIA in the last 168 hours. Coagulation Profile: No results for input(s): INR, PROTIME in the last 168 hours. Cardiac Enzymes: No results for input(s): CKTOTAL, CKMB, CKMBINDEX, TROPONINI in the last 168 hours. BNP (last 3 results) No results for input(s): PROBNP in the last 8760 hours. HbA1C: No  results for input(s): HGBA1C in the last 72 hours. CBG: Recent Labs  Lab 02/01/22 2007 02/02/22 0631 02/02/22 0739 02/02/22 1142 02/02/22 1613  GLUCAP 176* 201* 189* 276* 153*   Lipid Profile: No results for input(s): CHOL, HDL, LDLCALC, TRIG, CHOLHDL, LDLDIRECT in the last 72 hours. Thyroid Function Tests: No results for input(s): TSH, T4TOTAL, FREET4, T3FREE, THYROIDAB in the last 72 hours. Anemia Panel: No results for input(s): VITAMINB12, FOLATE, FERRITIN, TIBC, IRON, RETICCTPCT in the last 72 hours. Sepsis Labs: No results for input(s): PROCALCITON, LATICACIDVEN in the last 168 hours.  Recent Results (from the past 240 hour(s))  Resp Panel by RT-PCR (Flu A&B, Covid) Nasopharyngeal Swab     Status:  None   Collection Time: 01/30/22  9:01 PM   Specimen: Nasopharyngeal Swab; Nasopharyngeal(NP) swabs in vial transport medium  Result Value Ref Range Status   SARS Coronavirus 2 by RT PCR NEGATIVE NEGATIVE Final    Comment: (NOTE) SARS-CoV-2 target nucleic acids are NOT DETECTED.  The SARS-CoV-2 RNA is generally detectable in upper respiratory specimens during the acute phase of infection. The lowest concentration of SARS-CoV-2 viral copies this assay can detect is 138 copies/mL. A negative result does not preclude SARS-Cov-2 infection and should not be used as the sole basis for treatment or other patient management decisions. A negative result may occur with  improper specimen collection/handling, submission of specimen other than nasopharyngeal swab, presence of viral mutation(s) within the areas targeted by this assay, and inadequate number of viral copies(<138 copies/mL). A negative result must be combined with clinical observations, patient history, and epidemiological information. The expected result is Negative.  Fact Sheet for Patients:  EntrepreneurPulse.com.au  Fact Sheet for Healthcare Providers:  IncredibleEmployment.be  This test is no t yet approved or cleared by the [place] FDA and  has been authorized for detection and/or diagnosis of SARS-CoV-2 by FDA under an Emergency Use Authorization (EUA). This EUA will remain  in effect (meaning this test can be used) for the duration of the COVID-19 declaration under Section 564(b)(1) of the Act, 21 U.S.C.section 360bbb-3(b)(1), unless the authorization is terminated  or revoked sooner.       Influenza A by PCR NEGATIVE NEGATIVE Final   Influenza B by PCR NEGATIVE NEGATIVE Final    Comment: (NOTE) The Xpert Xpress SARS-CoV-2/FLU/RSV plus assay is intended as an aid in the diagnosis of influenza from Nasopharyngeal swab specimens and should not be used as a sole basis for  treatment. Nasal washings and aspirates are unacceptable for Xpert Xpress SARS-CoV-2/FLU/RSV testing.  Fact Sheet for Patients: EntrepreneurPulse.com.au  Fact Sheet for Healthcare Providers: IncredibleEmployment.be  This test is not yet approved or cleared by the [place] FDA and has been authorized for detection and/or diagnosis of SARS-CoV-2 by FDA under an Emergency Use Authorization (EUA). This EUA will remain in effect (meaning this test can be used) for the duration of the COVID-19 declaration under Section 564(b)(1) of the Act, 21 U.S.C. section 360bbb-3(b)(1), unless the authorization is terminated or revoked.  Performed at [facility], [address], [place], [place] [address]          Radiology Studies: No results found.      Scheduled Meds:  aspirin  325 mg Oral Daily   buPROPion  300 mg Oral Daily   calamine  1 application Topical BID   docusate sodium  100 mg Oral BID   enoxaparin (LOVENOX) injection  40 mg Subcutaneous Daily   feeding supplement  237 mL Oral BID BM   gabapentin  100 mg Oral QHS   insulin aspart  0-15 Units Subcutaneous TID WC   insulin aspart  0-5 Units Subcutaneous QHS   levothyroxine  50 mcg Oral q morning   lidocaine  1 patch Transdermal Q24H   multivitamin with minerals  1 tablet Oral Daily   sodium chloride flush  3 mL Intravenous Q12H   Continuous Infusions:  methocarbamol (ROBAXIN) IV      Assessment & Plan:   Principal Problem:   Falls frequently Active Problems:   Closed T7 fracture (HCC)   Dementia (HCC)   Diabetes mellitus without complication (HCC)   Dyslipidemia   Hypertension   Hypothyroidism (acquired)   Cervical stenosis of spinal canal   Bilateral hand pain   Falls frequently -Patient reports underlying tremor d/o that is being evaluated by neurology -Also with chronic dizziness that seems to be exacerbated by medications (amitiptyline) 2/17 PT  rec. snf    Cervical stenosis of spinal canal- (present on admission) Neurosurgery was following Needs SNF    Closed T7 fracture ([place])- (present on admission) TLSO brace for comfort Per neurosurgery T7 fracture is currently not unstable Pain control Can consider steroids and gabapentin teen Follow-up neurosurgery as outpatient in few weeks Lidocaine patch 2/17 pain better controlled but has room for improvement.  Agreeable to starting gabapentin if she has a reaction will stop.  Agreeable to starting 100 mg at bedtime first and increase as tolerated.    Hypothyroidism (acquired)- (present on admission) Continue Synthroid    Bilateral hand pain- (present on admission) -Patient with c/o B hand pain -She does report [facility] but her symptoms do not appear to be traumatic in nature -Imaging shows possible scapholunate ligamentous injury on the left with negative imaging on the right; this would like cause thumb/index finger issues but not 3-5 finger symptoms - which she is complaining of -Can try a removal wrist splint for comfort but likely chronic issue and she can do outpatient orthopedic f/u if it remains a problem (discussed with Silvestre Gunner)   Hypertension- (present on admission) -Hold Bumex for now -Will cover with prn IV hydralazine     Dyslipidemia- (present on admission) -On Repatha as an outpateint   Diabetes mellitus without complication ([place]) -Recent A1c was 7.4 -She does not appear to be taking outpatient medications -Will cover with moderate-scale SSI for now   Dementia [facility])- (present on admission) -She has apparent cognitive impairment and this was reported by neurologist recently as well -Delirium precautions ordered -Continue Wellbutrin, Restoril            DVT prophylaxis: Lovenox Code Status:full Family Communication: None at bedside Disposition Plan: SNF Status is: Inpatient Remains inpatient appropriate because: Unsafe discharge.  Needs  SNF              LOS: 2 days   Time spent: 35 min    Nolberto Hanlon, MD [facility] Pager [phone]  If 7PM-7AM, please contact night-coverage 02/02/2022, 6:00 PM

## 2022-02-02 NOTE — Progress Notes (Signed)
CSW reviewed RN note from 2/16 1950 regarding concerns expressed by pt daughter.  CSW spoke with pt and her granddaughter, [name].  Per pt, her daughter [name] has mental health issues and pt is her court appointed legal guardian.  [name] has always lived with pt due to these issues and [name] sometimes stays there as well.  [name] can be paranoid and the things she was saying yesterday regarding her brother are not true.  [name] confirmed this as well.  Pt concerned that [name] may be off her medications right now.  [name] does have community mental health services who support her and pt agreed to call them as they do not know pt is hospitalized.  They can check in on [name] and see if she has any needs currently.  CSW reviewed this situation with [facility] supervisor Isabella Or, do not think any further action is warranted. Lurline Idol, MSW, LCSW 2/17/20232:22 PM

## 2022-02-02 NOTE — Care Management Important Message (Signed)
Important Message  Patient Details  Name: [name] MRN: [id number] Date of Birth: 07-01-1951   Medicare Important Message Given:  Yes     Hannah Beat 02/02/2022, 12:17 PM

## 2022-02-02 NOTE — Progress Notes (Signed)
Pt's granddaughter informed this Probation officer to remove Sean(son) as a primary contact. Per Chris(son) ok to removed  Sean from the pt's contact.

## 2022-02-02 NOTE — Progress Notes (Signed)
Inpatient Rehab Admissions Coordinator:   I spoke with Pt. Regarding potential CIR admit. She  is interested but states that she lives alone and is not sure she will have 24/7 support from family when she leaves. I left a voicemail with request for callback with her granddaughter. I will go ahead and submit her case for insurance authorization today.   Clemens Catholic, [place], [place] Admissions Coordinator  [phone] ([place]) [phone] (office)

## 2022-02-02 NOTE — Progress Notes (Signed)
Inpatient Rehab Admissions Coordinator Note:   Per therapy recommendations patient was screened for CIR candidacy by Michel Santee, PT. At this time, pt appears to be a potential candidate for CIR. I will place an order for rehab consult for full assessment, per our protocol.  Please contact me any with questions.Shann Medal, PT, DPT [phone] 02/02/22 9:22 AM

## 2022-02-02 NOTE — Progress Notes (Signed)
Physical Therapy Treatment Patient Details Name: [name] MRN: [id number] DOB: 1951-06-17 Today's Date: 02/02/2022   History of Present Illness This 71 y.o. female admitted after falling over a cord and striking the top of her head on 2/14.   MRI of the thoracic spine reveals likely acute to subacute superior endplate fracture at T7 without significant loss of height.  There is also chronic compression fracture at L1. CT of head negative for acute abnormality.  X-ray of hand shows possible scapholunate ligamentous injury on the left (likely chronic) with negative imaging Rt hand.  MRI of C-Spine shows severe neural foraminal stenosis C5-6 (NS following).  PMH includes:  HTN, DM, dementia, h/o frequent falls, migraine, h/o dizziness. parkinson's like tremor - followed by neurology OP    PT Comments    Pt received in supine, agreeable to therapy session and with good participation and tolerance for functional mobility tasks in room. Emphasis on instruction on back precautions (handout given), importance of progressive OOB mobility within tolerance, safety with transfers, RW use and gait progression and fall risk prevention. Pt left in recliner, she was able to demo back use of call bell, no chair alarms present on unit, Unit Secretary notified, RN and NT aware she is requesting a bath sitting up for ease of self-care tasks within precautions. Pt continues to benefit from PT services to progress toward functional mobility goals.   Recommendations for follow up therapy are one component of a multi-disciplinary discharge planning process, led by the attending physician.  Recommendations may be updated based on patient status, additional functional criteria and insurance authorization.  Follow Up Recommendations  Acute inpatient rehab (3hours/day)     Assistance Recommended at Discharge Frequent or constant Supervision/Assistance  Patient can return home with the following Two people to help with  walking and/or transfers;Two people to help with bathing/dressing/bathroom;Assistance with cooking/housework;Assist for transportation;Help with stairs or ramp for entrance   Equipment Recommendations  None recommended by PT    Recommendations for Other Services Rehab consult     Precautions / Restrictions Precautions Precautions: Back;Fall Precaution Booklet Issued: Yes (comment) Precaution Comments: verbally reviewed, pt does not have reading glasses to see handout well Required Braces or Orthoses: Spinal Brace (per MD order, wrist brace should be donned but none seen in room so had pt avoid strong WB on L hand, RN notified no wrist brace seen in room during session.) Spinal Brace: Thoracolumbosacral orthotic Restrictions Weight Bearing Restrictions: No     Mobility  Bed Mobility Overal bed mobility: Needs Assistance Bed Mobility: Rolling, Sidelying to Sit Rolling: Min guard Sidelying to sit: Min assist       General bed mobility comments: verbally instructed along with physical help to maintain spinal precautions before getting on side of bed, pt with no recall from previous session    Transfers Overall transfer level: Needs assistance Equipment used: Rolling walker (2 wheels) Transfers: Sit to/from Stand Sit to Stand: Min assist, Mod assist           General transfer comment: from EOB>RW then RW>chair, cues needed for safe UE placement and pt "plops" unsafely with stand>sit needing modA for controlled lowering    Ambulation/Gait Ambulation/Gait assistance: Min assist Gait Distance (Feet): 30 Feet Assistive device: Rolling walker (2 wheels), 1 person hand held assist Gait Pattern/deviations: Step-to pattern, Decreased stride length, Shuffle Gait velocity: reduced     General Gait Details: cued pt to slide walker and avoid lifting it, to stand closer within RW, upright posture;  no LOB, min cues also not to turn head/twist back with fair carryover.   Stairs              Wheelchair Mobility    Modified Rankin (Stroke Patients Only)       Balance Overall balance assessment: Needs assistance Sitting-balance support: Feet supported Sitting balance-Leahy Scale: Good     Standing balance support: During functional activity, Bilateral upper extremity supported Standing balance-Leahy Scale: Fair Standing balance comment: fair balance using RW, poor balance without AD                            Cognition Arousal/Alertness: Awake/alert Behavior During Therapy: WFL for tasks assessed/performed, Anxious Overall Cognitive Status: No family/caregiver present to determine baseline cognitive functioning                                 General Comments: Pt more participatory, able to state need to use call bell to get up when prompted "what if you need to get up?" at end of session. Pt with no recall of back precautions at beginning of session, pt given handout (she reports not having reading glasses) so given verbal review, pt needs reminder x2 to avoid bending/twisting during mobility tasks. Pt receptive to instruction and eager to mobilize, wanting to take a bath, NT notified.        Exercises Other Exercises Other Exercises: seated BLE AROM: LAQ, ankle pumps x10 reps ea    General Comments General comments (skin integrity, edema, etc.): no acute s/sx distress, pt denies dizziness      Pertinent Vitals/Pain Pain Assessment Pain Assessment: Faces Faces Pain Scale: Hurts even more Pain Location: posterior neck/shoulders, headache Pain Descriptors / Indicators: Grimacing, Guarding, Discomfort Pain Intervention(s): Limited activity within patient's tolerance, Monitored during session, Repositioned, Patient requesting pain meds-RN notified           PT Goals (current goals can now be found in the care plan section) Acute Rehab PT Goals Patient Stated Goal: to get pain gone PT Goal Formulation: With  patient Time For Goal Achievement: 02/15/22 Progress towards PT goals: Progressing toward goals    Frequency    Min 3X/week      PT Plan Current plan remains appropriate       AM-PAC PT "6 Clicks" Mobility   Outcome Measure  Help needed turning from your back to your side while in a flat bed without using bedrails?: A Little Help needed moving from lying on your back to sitting on the side of a flat bed without using bedrails?: A Lot (mod cues) Help needed moving to and from a bed to a chair (including a wheelchair)?: A Lot (mod safety cues) Help needed standing up from a chair using your arms (e.g., wheelchair or bedside chair)?: A Lot (mod cues) Help needed to walk in hospital room?: A Little Help needed climbing 3-5 steps with a railing? : A Lot 6 Click Score: 14    End of Session Equipment Utilized During Treatment: Gait belt;Back brace (TLSO) Activity Tolerance: Patient tolerated treatment well;Patient limited by pain Patient left: with call bell/phone within reach;in chair (at her request, nursing aware pt wants to do a bath sitting in chair; no chair alarms available, [name] is to order them, pt able to demo call bell use; pt wants pain meds for neck) Nurse Communication: Mobility status;Other (comment);Patient requests pain meds (  needs chair alarm once they arrive to unit, see "pt left" section) PT Visit Diagnosis: Unsteadiness on feet (R26.81);Muscle weakness (generalized) (M62.81);Adult, failure to thrive (R62.7);Difficulty in walking, not elsewhere classified (R26.2)     Time: 0737-1062 PT Time Calculation (min) (ACUTE ONLY): 28 min  Charges:  $Gait Training: 8-22 mins $Therapeutic Activity: 8-22 mins                     Takina Busser P., PTA Acute Rehabilitation Services Pager: [phone] Office: [place] 02/02/2022, 2:03 PM

## 2022-02-02 NOTE — Progress Notes (Deleted)
Pt seen in room approximated  at 0920 ,Pt alert/oriented in no apparent distress. Pt c/o something bothering her  pointing to her sternum area, and per pt  she feels heaviness after swallowing,and she would like to have a chest xray. Consult MD made aware of above(see new orders). Pt denies any chest pain radiating to her neck/shoulder area. No nausea/vomiting noted at this time. Closely monitored.

## 2022-02-02 NOTE — Plan of Care (Signed)
°  Problem: Education: Goal: Knowledge of General Education information will improve Description: Including pain rating scale, medication(s)/side effects and non-pharmacologic comfort measures Outcome: Progressing   Problem: Clinical Measurements: Goal: Will remain free from infection Outcome: Progressing   Problem: Activity: Goal: Risk for activity intolerance will decrease Outcome: Progressing   Problem: Nutrition: Goal: Adequate nutrition will be maintained Outcome: Progressing   Problem: Coping: Goal: Level of anxiety will decrease Outcome: Progressing   Problem: Elimination: Goal: Will not experience complications related to bowel motility Outcome: Progressing   Problem: Pain Managment: Goal: General experience of comfort will improve Outcome: Progressing   

## 2022-02-02 NOTE — Progress Notes (Addendum)
MD, please order a consultation to Social Work pertaining to Progress Note 02/01/2022 time 1950.  Thank you.

## 2022-02-02 NOTE — TOC Initial Note (Signed)
Transition of Care [facility]) - Initial/Assessment Note    Patient Details  Name: [name] MRN: [id number] Date of Birth: Apr 22, 1951  Transition of Care [facility]) CM/SW Contact:    Sharin Mons, RN Phone Number: 02/02/2022, 10:43 AM  Clinical Narrative:                 Admitted s/p a fall. 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 States granddaughter and daughter spends a lot of time with her and supportive.  NCM spoke with pt @ bedside regarding d/c planning. Pt states PTA independent with ADL's. PT/OT evals shared. Recommendations: acute inpatient rehab (3hours/day). Pt agreeable to inpt rehab if needed.  Pt consulted by ip rehab... results pending.  TOC team monitoring and will assist with TOC needs.   Expected Discharge Plan: IP Rehab Facility Barriers to Discharge: Continued Medical Work up   Patient Goals and CMS Choice        Expected Discharge Plan and Services Expected Discharge Plan: [place]   Discharge Planning Services: CM Consult Post Acute Care Choice: Durable Medical Equipment Living arrangements for the past 2 months: Single Family Home                                      Prior Living Arrangements/Services Living arrangements for the past 2 months: Single Family Home Lives with:: Self, Other (Comment) (granddaughter , and daughter) Patient language and need for interpreter reviewed:: Yes Do you feel safe going back to the place where you live?: Yes      Need for Family Participation in Patient Care: Yes (Comment) Care giver support system in place?: Yes (comment) Current home services: DME (cane) Criminal Activity/Legal Involvement Pertinent to Current Situation/Hospitalization: No - Comment as needed  Activities of Daily Living      Permission Sought/Granted      Share Information with NAME: [name] (Granddaughter)  [phone]           Emotional Assessment Appearance::  Appears stated age Attitude/Demeanor/Rapport: Engaged Affect (typically observed): Accepting Orientation: : Oriented to Self, Oriented to Place, Oriented to  Time, Oriented to Situation Alcohol / Substance Use: Not Applicable Psych Involvement: No (comment)  Admission diagnosis:  Closed C7 fracture ([place]) [S12.600A] Facial laceration, initial encounter [S01.81XA] Central cord syndrome, initial encounter [facility]) [U31.497W] Patient Active Problem List   Diagnosis Date Noted   Closed T7 fracture ([place]) 01/31/2022   Dementia ([place]) 01/31/2022   Diabetes mellitus without complication ([place]) 26/37/8588   Dyslipidemia 01/31/2022   Hypertension 01/31/2022   Hypothyroidism (acquired) 01/31/2022   Cervical stenosis of spinal canal 01/31/2022   Falls frequently 01/31/2022   Bilateral hand pain 01/31/2022   PCP:  System, Provider Not In Pharmacy:   [facility] [address] [place] [address] Phone: [phone] Fax: [phone]     Social Determinants of Health (SDOH) Interventions    Readmission Risk Interventions No flowsheet data found.

## 2022-02-03 DIAGNOSIS — R296 Repeated falls: Secondary | ICD-10-CM | POA: Diagnosis not present

## 2022-02-03 LAB — GLUCOSE, CAPILLARY
Glucose-Capillary: 188 mg/dL — ABNORMAL HIGH (ref 70–99)
Glucose-Capillary: 211 mg/dL — ABNORMAL HIGH (ref 70–99)
Glucose-Capillary: 153 mg/dL — ABNORMAL HIGH (ref 70–99)
Glucose-Capillary: 161 mg/dL — ABNORMAL HIGH (ref 70–99)

## 2022-02-03 MED ORDER — GABAPENTIN 100 MG PO CAPS
100.0000 mg | ORAL_CAPSULE | Freq: Three times a day (TID) | ORAL | Status: DC
Start: 2022-02-03 — End: 2022-02-07
  Administered 2022-02-03 – 2022-02-07 (×12): 100 mg via ORAL
  Filled 2022-02-03 (×12): qty 1

## 2022-02-03 MED ORDER — LIDOCAINE 5 % EX PTCH
2.0000 | MEDICATED_PATCH | CUTANEOUS | Status: DC
  Administered 2022-02-04 – 2022-02-06 (×3): 2 via TRANSDERMAL
  Filled 2022-02-03 (×4): qty 2

## 2022-02-03 NOTE — Progress Notes (Signed)
PROGRESS NOTE    [name]  YKZ:[id number] DOB: 08-May-1951 DOA: 01/30/2022 PCP: System, Provider Not In    Brief Narrative:  Isabella Dehaas is a 71 y.o. female with medical history significant of DM, HTN, dementia, and pernicious anemia presenting with a fall.  She has been sick for a long time.  She went off amitriptyline for migraines and was 50% less dizzy and so wasn't falling TID.  She took some brain herbal for 5 days and it started making her dizzy (less than amitriptyline).  She came into the house and tripped.   She tried to break her fall with her hand.  Both hands are hurting severely.  She hasn't been able to sleep off the amitriptyline.  She is hurting in her shoulders.  She saw a neurosurgeon 8 months ago and "a compressed something" and there was a question about FARS.  She has Parkinson's-type symptoms.  She is having tremors at baseline.  She thinks she is not a candidate for surgery - elderly, anesthesia complications, calcifications in her brain.  She has decided she wants to see someone in [place].  2/17 pain is better control but still has room for improvement discussed gabapentin. She is agreeable to try it, if has any reaction ,will then stop. Will start with low dose at night .  2/18 pain is improving.  Tolerated gabapentin.  Complaining of pain behind her neck.  Consultants:  Neurosurgery  Procedures:   Antimicrobials:      Subjective: No chest pain or shortness of breath.  Wearing brace sitting in chair.  Objective: Vitals:   02/02/22 1428 02/02/22 2116 02/03/22 0610 02/03/22 0800  BP: 125/67 138/68 (!) 154/87 (!) 147/57  Pulse: 97 97 61 94  Resp: 18 17 16 17   Temp: 98.4 F (36.9 C) 98 F (36.7 C)  98.4 F (36.9 C)  TempSrc: Oral Oral  Oral  SpO2: 100% 98% 99%   Weight:      Height:        Intake/Output Summary (Last 24 hours) at 02/03/2022 1414 Last data filed at 02/02/2022 2015 Gross per 24 hour  Intake --  Output 300 ml  Net -300 ml    Filed Weights   01/30/22 1945 02/01/22 1439  Weight: 81.6 kg 79 kg    Examination: Calm, NAD Facial ecchymosis turning yellowish color improving Cta no w/r Reg s1/s2 no gallop Soft benign +bs No edema Aaoxox3  Mood and affect appropriate in current setting     Data Reviewed: I have personally reviewed following labs and imaging studies  CBC: Recent Labs  Lab 01/30/22 1943 02/01/22 0408  WBC 4.2 7.3  NEUTROABS 2.5  --   HGB 11.0* 12.3  HCT 32.8* 36.2  MCV 89.9 89.2  PLT 207 939   Basic Metabolic Panel: Recent Labs  Lab 01/30/22 1943 02/01/22 0408  NA 137 134*  K 3.5 4.1  CL 105 101  CO2 23 23  GLUCOSE 214* 210*  BUN 16 12  CREATININE 1.22* 1.01*  CALCIUM 8.6* 9.1   GFR: Estimated Creatinine Clearance: 49.6 mL/min (A) (by C-G formula based on SCr of 1.01 mg/dL (H)). Liver Function Tests: No results for input(s): AST, ALT, ALKPHOS, BILITOT, PROT, ALBUMIN in the last 168 hours. No results for input(s): LIPASE, AMYLASE in the last 168 hours. No results for input(s): AMMONIA in the last 168 hours. Coagulation Profile: No results for input(s): INR, PROTIME in the last 168 hours. Cardiac Enzymes: No results for input(s): CKTOTAL, CKMB, CKMBINDEX,  TROPONINI in the last 168 hours. BNP (last 3 results) No results for input(s): PROBNP in the last 8760 hours. HbA1C: No results for input(s): HGBA1C in the last 72 hours. CBG: Recent Labs  Lab 02/02/22 1142 02/02/22 1613 02/02/22 2115 02/03/22 0801 02/03/22 1146  GLUCAP 276* 153* 145* 188* 211*   Lipid Profile: No results for input(s): CHOL, HDL, LDLCALC, TRIG, CHOLHDL, LDLDIRECT in the last 72 hours. Thyroid Function Tests: No results for input(s): TSH, T4TOTAL, FREET4, T3FREE, THYROIDAB in the last 72 hours. Anemia Panel: No results for input(s): VITAMINB12, FOLATE, FERRITIN, TIBC, IRON, RETICCTPCT in the last 72 hours. Sepsis Labs: No results for input(s): PROCALCITON, LATICACIDVEN in the last 168  hours.  Recent Results (from the past 240 hour(s))  Resp Panel by RT-PCR (Flu A&B, Covid) Nasopharyngeal Swab     Status: None   Collection Time: 01/30/22  9:01 PM   Specimen: Nasopharyngeal Swab; Nasopharyngeal(NP) swabs in vial transport medium  Result Value Ref Range Status   SARS Coronavirus 2 by RT PCR NEGATIVE NEGATIVE Final    Comment: (NOTE) SARS-CoV-2 target nucleic acids are NOT DETECTED.  The SARS-CoV-2 RNA is generally detectable in upper respiratory specimens during the acute phase of infection. The lowest concentration of SARS-CoV-2 viral copies this assay can detect is 138 copies/mL. A negative result does not preclude SARS-Cov-2 infection and should not be used as the sole basis for treatment or other patient management decisions. A negative result may occur with  improper specimen collection/handling, submission of specimen other than nasopharyngeal swab, presence of viral mutation(s) within the areas targeted by this assay, and inadequate number of viral copies(<138 copies/mL). A negative result must be combined with clinical observations, patient history, and epidemiological information. The expected result is Negative.  Fact Sheet for Patients:  EntrepreneurPulse.com.au  Fact Sheet for Healthcare Providers:  IncredibleEmployment.be  This test is no t yet approved or cleared by the [place] FDA and  has been authorized for detection and/or diagnosis of SARS-CoV-2 by FDA under an Emergency Use Authorization (EUA). This EUA will remain  in effect (meaning this test can be used) for the duration of the COVID-19 declaration under Section 564(b)(1) of the Act, 21 U.S.C.section 360bbb-3(b)(1), unless the authorization is terminated  or revoked sooner.       Influenza A by PCR NEGATIVE NEGATIVE Final   Influenza B by PCR NEGATIVE NEGATIVE Final    Comment: (NOTE) The Xpert Xpress SARS-CoV-2/FLU/RSV plus assay is intended  as an aid in the diagnosis of influenza from Nasopharyngeal swab specimens and should not be used as a sole basis for treatment. Nasal washings and aspirates are unacceptable for Xpert Xpress SARS-CoV-2/FLU/RSV testing.  Fact Sheet for Patients: EntrepreneurPulse.com.au  Fact Sheet for Healthcare Providers: IncredibleEmployment.be  This test is not yet approved or cleared by the [place] FDA and has been authorized for detection and/or diagnosis of SARS-CoV-2 by FDA under an Emergency Use Authorization (EUA). This EUA will remain in effect (meaning this test can be used) for the duration of the COVID-19 declaration under Section 564(b)(1) of the Act, 21 U.S.C. section 360bbb-3(b)(1), unless the authorization is terminated or revoked.  Performed at [facility], [address], [place], [place] [address]          Radiology Studies: No results found.      Scheduled Meds:  aspirin  325 mg Oral Daily   buPROPion  300 mg Oral Daily   calamine  1 application Topical BID  docusate sodium  100 mg Oral BID   enoxaparin (LOVENOX) injection  40 mg Subcutaneous Daily   feeding supplement  237 mL Oral BID BM   gabapentin  100 mg Oral TID   insulin aspart  0-15 Units Subcutaneous TID WC   insulin aspart  0-5 Units Subcutaneous QHS   levothyroxine  50 mcg Oral q morning   lidocaine  2 patch Transdermal Q24H   multivitamin with minerals  1 tablet Oral Daily   sodium chloride flush  3 mL Intravenous Q12H   Continuous Infusions:  methocarbamol (ROBAXIN) IV      Assessment & Plan:   Principal Problem:   Falls frequently Active Problems:   Closed T7 fracture (HCC)   Dementia (HCC)   Diabetes mellitus without complication (HCC)   Dyslipidemia   Hypertension   Hypothyroidism (acquired)   Cervical stenosis of spinal canal   Bilateral hand pain   Falls frequently -Patient reports underlying tremor d/o that is being  evaluated by neurology -Also with chronic dizziness that seems to be exacerbated by medications (amitiptyline) 2/18 2/18 PT recommends SNF    Cervical stenosis of spinal canal- (present on admission) Neurosurgery was following Needs SNF    Closed T7 fracture ([place])- (present on admission) TLSO brace for comfort Per neurosurgery T7 fracture is currently not unstable Pain control Can consider steroids and gabapentin teen 2/18 increase gabapentin since tolerated 100 mg 3 times daily Change lidocaine patch to 2 patches , one in each area of pain Follow-up with neurosurgery as outpatient in few weeks    Hypothyroidism (acquired)- (present on admission) Continue Synthroid   Bilateral hand pain- (present on admission) -Patient with c/o B hand pain -She does report [facility] but her symptoms do not appear to be traumatic in nature -Imaging shows possible scapholunate ligamentous injury on the left with negative imaging on the right; this would like cause thumb/index finger issues but not 3-5 finger symptoms - which she is complaining of -Can try a removal wrist splint for comfort but likely chronic issue and she can do outpatient orthopedic f/u if it remains a problem (discussed with Silvestre Gunner)   Hypertension- (present on admission) -Hold Bumex for now -Will cover with prn IV hydralazine     Dyslipidemia- (present on admission) -On Repatha as an outpateint   Diabetes mellitus without complication ([place]) -Recent A1c was 7.4 -She does not appear to be taking outpatient medications -Will cover with moderate-scale SSI for now   Dementia [facility])- (present on admission) -She has apparent cognitive impairment and this was reported by neurologist recently as well -Delirium precautions ordered -Continue Wellbutrin, Restoril            DVT prophylaxis: Lovenox Code Status:full Family Communication: None at bedside Disposition Plan: SNF Status is: Inpatient Remains inpatient  appropriate because: Unsafe discharge.  Needs SNF              LOS: 3 days   Time spent: 35 min    Nolberto Hanlon, MD [facility] Pager [phone]  If 7PM-7AM, please contact night-coverage 02/03/2022, 2:14 PM

## 2022-02-04 DIAGNOSIS — R296 Repeated falls: Secondary | ICD-10-CM | POA: Diagnosis not present

## 2022-02-04 LAB — GLUCOSE, CAPILLARY
Glucose-Capillary: 181 mg/dL — ABNORMAL HIGH (ref 70–99)
Glucose-Capillary: 174 mg/dL — ABNORMAL HIGH (ref 70–99)
Glucose-Capillary: 228 mg/dL — ABNORMAL HIGH (ref 70–99)
Glucose-Capillary: 152 mg/dL — ABNORMAL HIGH (ref 70–99)

## 2022-02-04 NOTE — Plan of Care (Signed)

## 2022-02-04 NOTE — Progress Notes (Signed)
PROGRESS NOTE    [name]  YIF:[id number] DOB: Apr 18, 1951 DOA: 01/30/2022 PCP: System, Provider Not In    Brief Narrative:  [name] is a 71 y.o. female with medical history significant of DM, HTN, dementia, and pernicious anemia presenting with a fall.  She has been sick for a long time.  She went off amitriptyline for migraines and was 50% less dizzy and so wasn't falling TID.  She took some brain herbal for 5 days and it started making her dizzy (less than amitriptyline).  She came into the house and tripped.   She tried to break her fall with her hand.  Both hands are hurting severely.  She hasn't been able to sleep off the amitriptyline.  She is hurting in her shoulders.  She saw a neurosurgeon 8 months ago and "a compressed something" and there was a question about FARS.  She has Parkinson's-type symptoms.  She is having tremors at baseline.  She thinks she is not a candidate for surgery - elderly, anesthesia complications, calcifications in her brain.  She has decided she wants to see someone in [place].  2/17 pain is better control but still has room for improvement discussed gabapentin. She is agreeable to try it, if has any reaction ,will then stop. Will start with low dose at night .  2/18 pain is improving.  Tolerated gabapentin.  Complaining of pain behind her neck. 2/19 pain better controlled and tolerable with current regimen.  Consultants:  Neurosurgery  Procedures:   Antimicrobials:      Subjective: No chest pain or shortness of breath.  No other complaints  Objective: Vitals:   02/03/22 2022 02/04/22 0350 02/04/22 0710 02/04/22 1348  BP: 124/65 139/76 130/67 136/70  Pulse: 86 85 87 93  Resp:  16 17 18   Temp: 97.7 F (36.5 C) 97.6 F (36.4 C) 97.8 F (36.6 C) 98 F (36.7 C)  TempSrc: Oral Oral Oral Oral  SpO2: 97% 99% 98% 100%  Weight:      Height:       No intake or output data in the 24 hours ending 02/04/22 1647  Filed Weights    01/30/22 1945 02/01/22 1439  Weight: 81.6 kg 79 kg    Examination: Calm, NAD Cta no w/r Reg s1/s2 no gallop Soft benign +bs No edema Aaoxox3  Mood and affect appropriate in current setting     Data Reviewed: I have personally reviewed following labs and imaging studies  CBC: Recent Labs  Lab 01/30/22 1943 02/01/22 0408  WBC 4.2 7.3  NEUTROABS 2.5  --   HGB 11.0* 12.3  HCT 32.8* 36.2  MCV 89.9 89.2  PLT 207 867   Basic Metabolic Panel: Recent Labs  Lab 01/30/22 1943 02/01/22 0408  NA 137 134*  K 3.5 4.1  CL 105 101  CO2 23 23  GLUCOSE 214* 210*  BUN 16 12  CREATININE 1.22* 1.01*  CALCIUM 8.6* 9.1   GFR: Estimated Creatinine Clearance: 49.6 mL/min (A) (by C-G formula based on SCr of 1.01 mg/dL (H)). Liver Function Tests: No results for input(s): AST, ALT, ALKPHOS, BILITOT, PROT, ALBUMIN in the last 168 hours. No results for input(s): LIPASE, AMYLASE in the last 168 hours. No results for input(s): AMMONIA in the last 168 hours. Coagulation Profile: No results for input(s): INR, PROTIME in the last 168 hours. Cardiac Enzymes: No results for input(s): CKTOTAL, CKMB, CKMBINDEX, TROPONINI in the last 168 hours. BNP (last 3 results) No results for input(s): PROBNP in the  last 8760 hours. HbA1C: No results for input(s): HGBA1C in the last 72 hours. CBG: Recent Labs  Lab 02/03/22 1637 02/03/22 2217 02/04/22 0756 02/04/22 1230 02/04/22 1620  GLUCAP 153* 161* 181* 174* 228*   Lipid Profile: No results for input(s): CHOL, HDL, LDLCALC, TRIG, CHOLHDL, LDLDIRECT in the last 72 hours. Thyroid Function Tests: No results for input(s): TSH, T4TOTAL, FREET4, T3FREE, THYROIDAB in the last 72 hours. Anemia Panel: No results for input(s): VITAMINB12, FOLATE, FERRITIN, TIBC, IRON, RETICCTPCT in the last 72 hours. Sepsis Labs: No results for input(s): PROCALCITON, LATICACIDVEN in the last 168 hours.  Recent Results (from the past 240 hour(s))  Resp Panel by RT-PCR  (Flu A&B, Covid) Nasopharyngeal Swab     Status: None   Collection Time: 01/30/22  9:01 PM   Specimen: Nasopharyngeal Swab; Nasopharyngeal(NP) swabs in vial transport medium  Result Value Ref Range Status   SARS Coronavirus 2 by RT PCR NEGATIVE NEGATIVE Final    Comment: (NOTE) SARS-CoV-2 target nucleic acids are NOT DETECTED.  The SARS-CoV-2 RNA is generally detectable in upper respiratory specimens during the acute phase of infection. The lowest concentration of SARS-CoV-2 viral copies this assay can detect is 138 copies/mL. A negative result does not preclude SARS-Cov-2 infection and should not be used as the sole basis for treatment or other patient management decisions. A negative result may occur with  improper specimen collection/handling, submission of specimen other than nasopharyngeal swab, presence of viral mutation(s) within the areas targeted by this assay, and inadequate number of viral copies(<138 copies/mL). A negative result must be combined with clinical observations, patient history, and epidemiological information. The expected result is Negative.  Fact Sheet for Patients:  EntrepreneurPulse.com.au  Fact Sheet for Healthcare Providers:  IncredibleEmployment.be  This test is no t yet approved or cleared by the [place] FDA and  has been authorized for detection and/or diagnosis of SARS-CoV-2 by FDA under an Emergency Use Authorization (EUA). This EUA will remain  in effect (meaning this test can be used) for the duration of the COVID-19 declaration under Section 564(b)(1) of the Act, 21 U.S.C.section 360bbb-3(b)(1), unless the authorization is terminated  or revoked sooner.       Influenza A by PCR NEGATIVE NEGATIVE Final   Influenza B by PCR NEGATIVE NEGATIVE Final    Comment: (NOTE) The Xpert Xpress SARS-CoV-2/FLU/RSV plus assay is intended as an aid in the diagnosis of influenza from Nasopharyngeal swab specimens  and should not be used as a sole basis for treatment. Nasal washings and aspirates are unacceptable for Xpert Xpress SARS-CoV-2/FLU/RSV testing.  Fact Sheet for Patients: EntrepreneurPulse.com.au  Fact Sheet for Healthcare Providers: IncredibleEmployment.be  This test is not yet approved or cleared by the [place] FDA and has been authorized for detection and/or diagnosis of SARS-CoV-2 by FDA under an Emergency Use Authorization (EUA). This EUA will remain in effect (meaning this test can be used) for the duration of the COVID-19 declaration under Section 564(b)(1) of the Act, 21 U.S.C. section 360bbb-3(b)(1), unless the authorization is terminated or revoked.  Performed at [facility], [address], [place],  [address]          Radiology Studies: No results found.      Scheduled Meds:  aspirin  325 mg Oral Daily   buPROPion  300 mg Oral Daily   calamine  1 application Topical BID   enoxaparin (LOVENOX) injection  40 mg Subcutaneous Daily   feeding supplement  237 mL Oral BID  BM   gabapentin  100 mg Oral TID   insulin aspart  0-15 Units Subcutaneous TID WC   insulin aspart  0-5 Units Subcutaneous QHS   levothyroxine  50 mcg Oral q morning   lidocaine  2 patch Transdermal Q24H   multivitamin with minerals  1 tablet Oral Daily   sodium chloride flush  3 mL Intravenous Q12H   Continuous Infusions:  methocarbamol (ROBAXIN) IV      Assessment & Plan:   Principal Problem:   Falls frequently Active Problems:   Closed T7 fracture (HCC)   Dementia (HCC)   Diabetes mellitus without complication (HCC)   Dyslipidemia   Hypertension   Hypothyroidism (acquired)   Cervical stenosis of spinal canal   Bilateral hand pain   Falls frequently -Patient reports underlying tremor d/o that is being evaluated by neurology -Also with chronic dizziness that seems to be exacerbated by medications  (amitiptyline) 2/19 PT rec. SNF    Cervical stenosis of spinal canal- (present on admission) Neurosurgery was following 2/19 plan for SNF    Closed T7 fracture ([place])- (present on admission) TLSO brace for comfort Per neurosurgery T7 fracture is currently not unstable Pain control Can consider steroids and gabapentin teen 2/19 continue with lidocaine patches, gabapentin Increase gabapentin as tolerated slowly Follow-up neurosurgery as outpatient in few weeks     Hypothyroidism (acquired)- (present on admission) Continue Synthroid   Bilateral hand pain- (present on admission) -Patient with c/o B hand pain -She does report [facility] but her symptoms do not appear to be traumatic in nature -Imaging shows possible scapholunate ligamentous injury on the left with negative imaging on the right; this would like cause thumb/index finger issues but not 3-5 finger symptoms - which she is complaining of -Can try a removal wrist splint for comfort but likely chronic issue and she can do outpatient orthopedic f/u if it remains a problem (discussed with Silvestre Gunner)   Hypertension- (present on admission) -Hold Bumex for now -Will cover with prn IV hydralazine     Dyslipidemia- (present on admission) -On Repatha as an outpateint   Diabetes mellitus without complication ([place]) -Recent A1c was 7.4 -She does not appear to be taking outpatient medications -Will cover with moderate-scale SSI for now   Dementia [facility])- (present on admission) -She has apparent cognitive impairment and this was reported by neurologist recently as well -Delirium precautions ordered -Continue Wellbutrin, Restoril            DVT prophylaxis: Lovenox Code Status:full Family Communication: None at bedside Disposition Plan: SNF Status is: Inpatient Remains inpatient appropriate because: Unsafe discharge.  Needs SNF              LOS: 4 days   Time spent: 35 min    Nolberto Hanlon, MD [facility] Pager [phone]  If 7PM-7AM, please contact night-coverage 02/04/2022, 4:47 PM

## 2022-02-04 NOTE — PMR Pre-admission (Signed)
PMR Admission Coordinator Pre-Admission Assessment  Patient: [name] is an 71 y.o., female MRN: [id number] DOB: Jun 28, 1951 Height: 5' 2.5" (158.8 cm) Weight: 79 kg  Insurance Information HMO:   yes  PPO:      PCP:      IPA:      80/20: yes     OTHER:  PRIMARY: U[employer] Medicare      Policy#: [id number]      Subscriber:  CM Name: na       Phone#: [phone]     Fax#:  [phone] Pre-Cert#: [id number]      Employer:  Benefits:  Phone #:      Name: I received notification that appeal was overturned from [place] with [employer]. Pt. Approved 2/22-2/29 with updates due 2/28.  Eff Date: 01/17/2022 - still active Deductible: does not have one OOP Max: $8,300 ($42.18 met) CIR: $1,556/ admission copay SNF: $0.00 Copayment per day for days 1-20; $200 Copayment per day for days 21-100; Maximum of 100 days/benefit period Outpatient: 80% coverage; 20% co-insurance Home Health:  100% coverage, limited by medical necessity DME: 80% coverage; 20% co-insurance Providers: in network  SECONDARY: Medicaid [place] [facility]      Policy#:  [id number] M    Phone#:   Financial Counselor:       Phone#:   The Data Collection Information Summary for patients in Inpatient Rehabilitation Facilities with attached Privacy Act [place] Records was provided and verbally reviewed with: Family  Emergency Contact Information Contact Information     Name Relation Home Work [place] Granddaughter   [phone]       Current Medical History  Patient Admitting Diagnosis: t7 fracture History of Present Illness: [name] is a 71 y.o. female with medical history significant of DM, HTN, dementia, and pernicious anemia who presented  after falling over a cord and striking the top of her head on 2/14.   MRI of the thoracic spine reveals likely acute to subacute superior endplate fracture at T7 without significant loss of height.  There is also chronic compression fracture at L1. CT of  head negative for acute abnormality.  X-ray of hand shows possible scapholunate ligamentous injury on the left (likely chronic) with negative imaging Rt hand.  MRI of C-Spine shows severe neural foraminal stenosis C5-6. She was seen by PT/OT/SLP who recommended CIR to assist return to PLOF.   Patient's medical record from [facility] has been reviewed by the rehabilitation admission coordinator and physician.  Past Medical History  Past Medical History:  Diagnosis Date   Dementia ([place])    Diabetes mellitus without complication ([place])    Dyslipidemia    Endometrial cancer ([place]) 2021   Hypertension    Hypothyroidism (acquired)    OSA (obstructive sleep apnea)    not on CPAP   Pernicious anemia     Has the patient had major surgery during 100 days prior to admission? No  Family History   family history is not on file.  Current Medications  Current Facility-Administered Medications:    acetaminophen (TYLENOL) tablet 650 mg, 650 mg, Oral, Q6H PRN, 650 mg at 02/04/22 0543 **OR** acetaminophen (TYLENOL) suppository 650 mg, 650 mg, Rectal, Q6H PRN, Karmen Bongo, MD   aspirin EC tablet 325 mg, 325 mg, Oral, Daily, Karmen Bongo, MD, 325 mg at 02/04/22 0954   bisacodyl (DULCOLAX) EC tablet 5 mg, 5 mg, Oral, Daily PRN, Karmen Bongo, MD   buPROPion (WELLBUTRIN XL) 24 hr tablet 300 mg, 300 mg,  Oral, Daily, Karmen Bongo, MD, 300 mg at 02/04/22 9371   calamine lotion 1 application, 1 application, Topical, BID, Amery, Sahar, MD   docusate sodium (COLACE) capsule 100 mg, 100 mg, Oral, BID, Karmen Bongo, MD, 100 mg at 02/04/22 0954   enoxaparin (LOVENOX) injection 40 mg, 40 mg, Subcutaneous, Daily, Karmen Bongo, MD, 40 mg at 02/04/22 0953   feeding supplement (ENSURE ENLIVE / ENSURE PLUS) liquid 237 mL, 237 mL, Oral, BID BM, Kurtis Bushman, Sahar, MD, 237 mL at 02/04/22 1000   gabapentin (NEURONTIN) capsule 100 mg, 100 mg, Oral, TID, Nolberto Hanlon, MD, 100 mg at 02/04/22 0954    hydrALAZINE (APRESOLINE) injection 5 mg, 5 mg, Intravenous, Q4H PRN, Karmen Bongo, MD   insulin aspart (novoLOG) injection 0-15 Units, 0-15 Units, Subcutaneous, TID WC, Karmen Bongo, MD, 3 Units at 02/04/22 0815   insulin aspart (novoLOG) injection 0-5 Units, 0-5 Units, Subcutaneous, QHS, Karmen Bongo, MD   levothyroxine (SYNTHROID) tablet 50 mcg, 50 mcg, Oral, q morning, Karmen Bongo, MD, 50 mcg at 02/04/22 0715   lidocaine (LIDODERM) 5 % 2 patch, 2 patch, Transdermal, Q24H, Kurtis Bushman, Sahar, MD   methocarbamol (ROBAXIN) 500 mg in dextrose 5 % 50 mL IVPB, 500 mg, Intravenous, Q6H PRN, Karmen Bongo, MD   morphine (PF) 2 MG/ML injection 2 mg, 2 mg, Intravenous, Q2H PRN, Karmen Bongo, MD, 2 mg at 02/02/22 0347   multivitamin with minerals tablet 1 tablet, 1 tablet, Oral, Daily, Nolberto Hanlon, MD, 1 tablet at 02/04/22 0954   ondansetron (ZOFRAN) tablet 4 mg, 4 mg, Oral, Q6H PRN **OR** ondansetron (ZOFRAN) injection 4 mg, 4 mg, Intravenous, Q6H PRN, Karmen Bongo, MD, 4 mg at 01/31/22 1841   oxyCODONE (Oxy IR/ROXICODONE) immediate release tablet 5 mg, 5 mg, Oral, Q4H PRN, Karmen Bongo, MD, 5 mg at 02/03/22 2240   polyethylene glycol (MIRALAX / GLYCOLAX) packet 17 g, 17 g, Oral, Daily PRN, Karmen Bongo, MD   sodium chloride flush (NS) 0.9 % injection 3 mL, 3 mL, Intravenous, Q12H, Karmen Bongo, MD, 3 mL at 02/04/22 1000   temazepam (RESTORIL) capsule 15 mg, 15 mg, Oral, QHS PRN, Karmen Bongo, MD  Patients Current Diet:  Diet Order             Diet regular Room service appropriate? Yes with Assist; Fluid consistency: Thin  Diet effective now                   Precautions / Restrictions Precautions Precautions: Back, Fall Precaution Booklet Issued: Yes (comment) Precaution Comments: verbally reviewed, pt does not have reading glasses to see handout well Spinal Brace: Thoracolumbosacral orthotic Restrictions Weight Bearing Restrictions: No   Has the patient had  2 or more falls or a fall with injury in the past year? Yes  Prior Activity Level Community (5-7x/wk): Pt. was active in the community PTA  Prior Functional Level Self Care: Did the patient need help bathing, dressing, using the toilet or eating? Independent  Indoor Mobility: Did the patient need assistance with walking from room to room (with or without device)? Independent  Stairs: Did the patient need assistance with internal or external stairs (with or without device)? Independent  Functional Cognition: Did the patient need help planning regular tasks such as shopping or remembering to take medications? Independent  Patient Information Are you of Hispanic, Latino/a,or Spanish origin?: A. No, not of Hispanic, Latino/a, or Spanish origin What is your race?: A. White Do you need or want an interpreter to communicate with a doctor or  health care staff?: 0. No  Patient's Response To:  Health Literacy and Transportation Is the patient able to respond to health literacy and transportation needs?: Yes Health Literacy - How often do you need to have someone help you when you read instructions, pamphlets, or other written material from your doctor or pharmacy?: Sometimes In the past 12 months, has lack of transportation kept you from medical appointments or from getting medications?: Yes In the past 12 months, has lack of transportation kept you from meetings, work, or from getting things needed for daily living?: Yes  Home Assistive Devices / Equipment    Prior Device Use: Indicate devices/aids used by the patient prior to current illness, exacerbation or injury? None of the above  Current Functional Level Cognition  Arousal/Alertness: Awake/alert Overall Cognitive Status: No family/caregiver present to determine baseline cognitive functioning Orientation Level: Oriented to person, Oriented to place, Oriented to situation General Comments: Pt more participatory, able to state need to  use call bell to get up when prompted "what if you need to get up?" at end of session. Pt with no recall of back precautions at beginning of session, pt given handout (she reports not having reading glasses) so given verbal review, pt needs reminder x2 to avoid bending/twisting during mobility tasks. Pt receptive to instruction and eager to mobilize, wanting to take a bath, NT notified. Attention: Sustained Sustained Attention: Impaired Sustained Attention Impairment: Verbal basic Memory: Impaired Memory Impairment: Storage deficit, Retrieval deficit Awareness: Impaired Awareness Impairment: Anticipatory impairment Problem Solving: Impaired Problem Solving Impairment: Verbal basic Safety/Judgment: Impaired    Extremity Assessment (includes Sensation/Coordination)  Upper Extremity Assessment: Defer to OT evaluation RUE Deficits / Details: full ROM.  Decreased active flexion of digits 2 and 3.  Tremor noted.  Arthritic deformity noted RUE Sensation: decreased light touch RUE Coordination: decreased fine motor, decreased gross motor LUE Deficits / Details: Full AROM.  tremor noted.  arthritic deformity noted LUE Sensation: decreased light touch LUE Coordination: decreased fine motor, decreased gross motor  Lower Extremity Assessment: Generalized weakness    ADLs  Overall ADL's : Needs assistance/impaired Eating/Feeding: Set up, Bed level Grooming: Wash/dry hands, Wash/dry face, Minimal assistance, Standing Upper Body Bathing: Moderate assistance, Sitting Lower Body Bathing: Moderate assistance, Sit to/from stand Upper Body Dressing : Moderate assistance, Sitting Lower Body Dressing: Moderate assistance, Sit to/from stand Lower Body Dressing Details (indicate cue type and reason): able to perform figure 4.  Donned socks with min A Toilet Transfer: Minimal assistance, Ambulation, Comfort height toilet, Rolling walker (2 wheels) Toileting- Clothing Manipulation and Hygiene: Maximal  assistance, Sit to/from stand Functional mobility during ADLs: Minimal assistance, Rolling walker (2 wheels)    Mobility  Overal bed mobility: Needs Assistance Bed Mobility: Rolling, Sidelying to Sit Rolling: Min guard Sidelying to sit: Min assist Sit to sidelying: Mod assist General bed mobility comments: verbally instructed along with physical help to maintain spinal precautions before getting on side of bed, pt with no recall from previous session    Transfers  Overall transfer level: Needs assistance Equipment used: Rolling walker (2 wheels) Transfers: Sit to/from Stand Sit to Stand: Min assist, Mod assist Bed to/from chair/wheelchair/BSC transfer type:: Step pivot Step pivot transfers: Min assist General transfer comment: from EOB>RW then RW>chair, cues needed for safe UE placement and pt "plops" unsafely with stand>sit needing modA for controlled lowering    Ambulation / Gait / Stairs / Wheelchair Mobility  Ambulation/Gait Ambulation/Gait assistance: Herbalist (Feet): 30 Feet Assistive device: Rolling  walker (2 wheels), 1 person hand held assist Gait Pattern/deviations: Step-to pattern, Decreased stride length, Shuffle General Gait Details: cued pt to slide walker and avoid lifting it, to stand closer within RW, upright posture; no LOB, min cues also not to turn head/twist back with fair carryover. Gait velocity: reduced Gait velocity interpretation: <1.31 ft/sec, indicative of household ambulator Pre-gait activities: standing balance cues    Posture / Balance Balance Overall balance assessment: Needs assistance Sitting-balance support: Feet supported Sitting balance-Leahy Scale: Good Standing balance support: During functional activity, Bilateral upper extremity supported Standing balance-Leahy Scale: Fair Standing balance comment: fair balance using RW, poor balance without AD    Special needs/care consideration Skin ecchymosis on R upper face and Special  service needs Thoracolumbosacral orthotic spinal brace when OOB   Previous Home Environment (from acute therapy documentation) Living Arrangements: Alone, Children  Lives With: Alone Available Help at Discharge: Family, Available PRN/intermittently Type of Home: House Home Layout: One level Home Access: Stairs to enter [employer] of Steps: 2 Bathroom Shower/Tub: Optometrist: Yes [place]: No Additional Comments: pt is struggling with some history details, distracted by her neck and back pain, as well as post head pain  Discharge Living Setting Plans for Discharge Living Setting: Patient's home Type of Home at Discharge: House Discharge Home Layout: One level Discharge Home Access: Stairs to enter Entrance Stairs-Rails: None Entrance Stairs-Number of Steps: 2 Discharge Bathroom Shower/Tub: Tub/shower unit Discharge Bathroom Toilet: Standard Discharge Bathroom Accessibility: Yes Does the patient have any problems obtaining your medications?: No  Social/Family/Support Systems Patient Roles: Other (Comment) Contact Information: [phone] Anticipated Caregiver: [employer] account executive) Ability/Limitations of Caregiver: Min A Caregiver Availability: 24/7 Discharge Plan Discussed with Primary Caregiver: Yes Is Caregiver In Agreement with Plan?: Yes Does Caregiver/Family have Issues with Lodging/Transportation while Pt is in Rehab?: Yes  Goals Patient/Family Goal for Rehab: PT/OT/SLP supervision Expected length of stay: 7-10 days Pt/Family Agrees to Admission and willing to participate: Yes Program Orientation Provided & Reviewed with Pt/Caregiver Including Roles  & Responsibilities: Yes  Decrease burden of Care through IP rehab admission: Specialzed equipment needs, Decrease number of caregivers, Bowel and bladder program, and Patient/family education  Possible need for SNF placement upon discharge: not  anticipated  Patient Condition: I have reviewed medical records from [facility], spoken with CM, and patient and family member. I met with patient at the bedside for inpatient rehabilitation assessment.  Patient will benefit from ongoing PT, OT, and SLP, can actively participate in 3 hours of therapy a day 5 days of the week, and can make measurable gains during the admission.  Patient will also benefit from the coordinated team approach during an Inpatient Acute Rehabilitation admission.  The patient will receive intensive therapy as well as Rehabilitation physician, nursing, social worker, and care management interventions.  Due to safety, skin/wound care, disease management, medication administration, pain management, and patient education the patient requires 24 hour a day rehabilitation nursing.  The patient is currently Min A-min g with mobility and basic ADLs.  Discharge setting and therapy post discharge at home with home health is anticipated.  Patient has agreed to participate in the Acute Inpatient Rehabilitation Program and will admit today.  Preadmission Screen Completed By:  Genella Mech, 02/04/2022 11:53 AM ______________________________________________________________________   Discussed status with Dr. Ranell Patrick  on 02/07/22  at 1000 and received approval for admission today.  Admission Coordinator:  Genella Mech, CCC-SLP, time 1000/Date 02/07/22  Assessment/Plan: Diagnosis: Multiple compression fractures Does the need for close, 24 hr/day Medical supervision in concert with the patient's rehab needs make it unreasonable for this patient to be served in a less intensive setting? Yes Co-Morbidities requiring supervision/potential complications: moderate cervical spinal stenosis, bilateral hand pain, HTN, dyslipidemia, type 2 DM Due to bladder management, bowel management, safety, skin/wound care, disease management, medication administration, pain management, and  patient education, does the patient require 24 hr/day rehab nursing? Yes Does the patient require coordinated care of a physician, rehab nurse, PT, OT, and SLP to address physical and functional deficits in the context of the above medical diagnosis(es)? Yes Addressing deficits in the following areas: balance, endurance, locomotion, strength, transferring, bowel/bladder control, bathing, dressing, feeding, grooming, toileting, cognition, and psychosocial support Can the patient actively participate in an intensive therapy program of at least 3 hrs of therapy 5 days a week? Yes The potential for patient to make measurable gains while on inpatient rehab is excellent Anticipated functional outcomes upon discharge from inpatient rehab: supervision PT, supervision OT, supervision SLP Estimated rehab length of stay to reach the above functional goals is: 10-12 days Anticipated discharge destination: Home 10. Overall Rehab/Functional Prognosis: excellent   MD Signature: Leeroy Cha, MD

## 2022-02-05 DIAGNOSIS — E440 Moderate protein-calorie malnutrition: Secondary | ICD-10-CM | POA: Insufficient documentation

## 2022-02-05 DIAGNOSIS — R296 Repeated falls: Secondary | ICD-10-CM | POA: Diagnosis not present

## 2022-02-05 LAB — GLUCOSE, CAPILLARY
Glucose-Capillary: 164 mg/dL — ABNORMAL HIGH (ref 70–99)
Glucose-Capillary: 202 mg/dL — ABNORMAL HIGH (ref 70–99)
Glucose-Capillary: 144 mg/dL — ABNORMAL HIGH (ref 70–99)
Glucose-Capillary: 200 mg/dL — ABNORMAL HIGH (ref 70–99)
Glucose-Capillary: 157 mg/dL — ABNORMAL HIGH (ref 70–99)

## 2022-02-05 MED ORDER — METHOCARBAMOL 500 MG PO TABS
500.0000 mg | ORAL_TABLET | Freq: Four times a day (QID) | ORAL | Status: DC | PRN
  Administered 2022-02-05: 500 mg via ORAL
  Filled 2022-02-05: qty 1

## 2022-02-05 NOTE — Progress Notes (Signed)
Occupational Therapy Treatment Patient Details Name: Jahniah Pallas MRN: [id number] DOB: 1951-07-02 Today's Date: 02/05/2022   History of present illness This 71 y.o. female admitted after falling over a cord and striking the top of her head on 2/14.   MRI of the thoracic spine reveals likely acute to subacute superior endplate fracture at T7 without significant loss of height.  There is also chronic compression fracture at L1. CT of head negative for acute abnormality.  X-ray of hand shows possible scapholunate ligamentous injury on the left (likely chronic) with negative imaging Rt hand.  MRI of C-Spine shows severe neural foraminal stenosis C5-6 (NS following).  PMH includes:  HTN, DM, dementia, h/o frequent falls, migraine, h/o dizziness. parkinson's like tremor - followed by neurology OP   OT comments  Pt progressing well towards goals, supervision-min guard for toileting and sinkside ADL this session, pt min guard for transfers. Demonstrates compensatory strategies for LB dressing with supervision. Pt presenting with impairments listed below, benefits from increased cuing for safety due to decreased short term memory. Pt able to recall steps to don L wrist brace dons/doffs at sink, demonstrates decreased fine motor skills when opening items on lunch tray. Pt presenting with impairments listed below, will follow. Continue to recommend AIR/CIR at d/c.   Recommendations for follow up therapy are one component of a multi-disciplinary discharge planning process, led by the attending physician.  Recommendations may be updated based on patient status, additional functional criteria and insurance authorization.    Follow Up Recommendations  Acute inpatient rehab (3hours/day)    Assistance Recommended at Discharge Frequent or constant Supervision/Assistance  Patient can return home with the following  A lot of help with walking and/or transfers;A lot of help with bathing/dressing/bathroom;Assistance  with cooking/housework;Direct supervision/assist for medications management;Direct supervision/assist for financial management;Assist for transportation;Help with stairs or ramp for entrance   Equipment Recommendations  Other (comment) (RW)    Recommendations for Other Services      Precautions / Restrictions Precautions Precautions: Back;Fall Precaution Booklet Issued: No Precaution Comments: verbally reviewed with pt Required Braces or Orthoses: Splint/Cast;Spinal Brace Spinal Brace: Thoracolumbosacral orthotic Restrictions Weight Bearing Restrictions: No       Mobility Bed Mobility               General bed mobility comments: up in chair upon arrival    Transfers Overall transfer level: Needs assistance Equipment used: Rolling walker (2 wheels) Transfers: Sit to/from Stand Sit to Stand: Min guard           General transfer comment: cues for hand placement on RW during transfers/ambulation     Balance Overall balance assessment: Needs assistance Sitting-balance support: Feet supported Sitting balance-Leahy Scale: Good     Standing balance support: During functional activity, Bilateral upper extremity supported Standing balance-Leahy Scale: Fair Standing balance comment: fair with walker, poor without                           ADL either performed or assessed with clinical judgement   ADL Overall ADL's : Needs assistance/impaired     Grooming: Wash/dry hands;Wash/dry face;Standing Grooming Details (indicate cue type and reason): completed standing at sink             Lower Body Dressing: Supervision/safety;Sitting/lateral leans Lower Body Dressing Details (indicate cue type and reason): dons socks using figure 4 technique Toilet Transfer: Min guard;Rolling walker (2 wheels);Ambulation;Regular Museum/gallery exhibitions officer and Hygiene: Min guard;Sit to/from  stand Toileting - Clothing Manipulation Details (indicate cue type  and reason): completes pericare and clothing mgmt     Functional mobility during ADLs: Min guard;Rolling walker (2 wheels)      Extremity/Trunk Assessment Upper Extremity Assessment Upper Extremity Assessment: Generalized weakness RUE Deficits / Details: full ROM.  Decreased active flexion of digits 2 and 3.  Tremor noted.  Arthritic deformity noted LUE Deficits / Details: Full AROM.  tremor noted.  arthritic deformity noted LUE Coordination: decreased fine motor;decreased gross motor   Lower Extremity Assessment Lower Extremity Assessment: Defer to PT evaluation        Vision   Vision Assessment?: No apparent visual deficits   Perception Perception Perception: Not tested   Praxis Praxis Praxis: Not tested    Cognition Arousal/Alertness: Awake/alert Behavior During Therapy: WFL for tasks assessed/performed, Anxious Overall Cognitive Status: No family/caregiver present to determine baseline cognitive functioning                                 General Comments: requires somewhat repetitive cues for mobility particularly directions, but is aware of her braces and the expectation of wearing them        Exercises      Shoulder Instructions       General Comments pt able to recall steps to don/doff L wrist brace    Pertinent Vitals/ Pain       Pain Assessment Pain Assessment: Faces Faces Pain Scale: Hurts little more Pain Location: shoulders and neck, radiating to LUE Pain Descriptors / Indicators: Guarding, Grimacing Pain Intervention(s): Limited activity within patient's tolerance, Monitored during session, Premedicated before session, Repositioned  Home Living                                          Prior Functioning/Environment              Frequency  Min 2X/week        Progress Toward Goals  OT Goals(current goals can now be found in the care plan section)  Progress towards OT goals: Progressing toward  goals  Acute Rehab OT Goals Patient Stated Goal: none stated OT Goal Formulation: With patient Time For Goal Achievement: 02/15/22 Potential to Achieve Goals: Good ADL Goals Pt Will Perform Grooming: with min guard assist;standing Pt Will Perform Upper Body Bathing: with set-up;with supervision;sitting Pt Will Perform Lower Body Bathing: with min guard assist;sit to/from stand Pt Will Perform Upper Body Dressing: with set-up;with supervision;sitting Pt Will Perform Lower Body Dressing: with min guard assist;sit to/from stand Pt Will Transfer to Toilet: with min guard assist;ambulating;grab bars;bedside commode;regular height toilet Pt Will Perform Toileting - Clothing Manipulation and hygiene: with min guard assist;sit to/from stand  Plan Discharge plan remains appropriate;Frequency remains appropriate    Co-evaluation                 AM-PAC OT "6 Clicks" Daily Activity     Outcome Measure   Help from another person eating meals?: A Little Help from another person taking care of personal grooming?: A Little Help from another person toileting, which includes using toliet, bedpan, or urinal?: A Little Help from another person bathing (including washing, rinsing, drying)?: A Lot Help from another person to put on and taking off regular upper body clothing?: A Little Help from another person to put  on and taking off regular lower body clothing?: A Lot 6 Click Score: 16    End of Session Equipment Utilized During Treatment: Gait belt;Rolling walker (2 wheels);Back brace  OT Visit Diagnosis: Unsteadiness on feet (R26.81);Cognitive communication deficit (R41.841);Pain;Repeated falls (R29.6) Pain - Right/Left: Left Pain - part of body: Shoulder;Hand   Activity Tolerance Patient tolerated treatment well   Patient Left in chair;with call bell/phone within reach (pt able to demo use of call bell, RN aware, no chair alarms on unit)   Nurse Communication Mobility  status;Precautions        Time: 8295-6213 OT Time Calculation (min): 33 min  Charges: OT General Charges $OT Visit: 1 Visit OT Treatments $Self Care/Home Management : 23-37 mins  Lynnda Child, OTD, OTR/L Acute Rehab [phone] - [place] 02/05/2022, 1:39 PM

## 2022-02-05 NOTE — Progress Notes (Signed)
Inpatient Rehab Admissions Coordinator:   I do not have a bed for this Pt. On CIR today. I continue to await insurance auth. I updated Pt. And she states that she wants to file an appeal if insurance denies. I will continue to follow for potential admit pending insurance auth and bed availability.   Clemens Catholic, [place], [place] Admissions Coordinator  [phone] ([place]) [phone] (office)

## 2022-02-05 NOTE — Progress Notes (Signed)
PROGRESS NOTE    [name]  YOV:[id number] DOB: 19-Dec-1950 DOA: 01/30/2022 PCP: System, Provider Not In    Brief Narrative:  [name] is a 71 y.o. female with medical history significant of DM, HTN, dementia, and pernicious anemia presenting with a fall.  She has been sick for a long time.  She went off amitriptyline for migraines and was 50% less dizzy and so wasn't falling TID.  She took some brain herbal for 5 days and it started making her dizzy (less than amitriptyline).  She came into the house and tripped.   She tried to break her fall with her hand.  Both hands are hurting severely.  She hasn't been able to sleep off the amitriptyline.  She is hurting in her shoulders.  She saw a neurosurgeon 8 months ago and "a compressed something" and there was a question about FARS.  She has Parkinson's-type symptoms.  She is having tremors at baseline.  She thinks she is not a candidate for surgery - elderly, anesthesia complications, calcifications in her brain.  She has decided she wants to see someone in [place].  2/17 pain is better control but still has room for improvement discussed gabapentin. She is agreeable to try it, if has any reaction ,will then stop. Will start with low dose at night .  2/18 pain is improving.  Tolerated gabapentin.  Complaining of pain behind her neck. 2/19 pain better controlled and tolerable with current regimen. 2/20 with mild diarrhea.  She does report having chronic diarrhea on and off for the past 2 years.  Pain controlled.  Consultants:  Neurosurgery  Procedures:   Antimicrobials:      Subjective: No chest pain, shortness of breath, belly pain or any new complaint  Objective: Vitals:   02/04/22 2036 02/05/22 0500 02/05/22 0733 02/05/22 1210  BP: 128/62 (!) 151/71 (!) 143/74 138/64  Pulse: 85 85 90 87  Resp: 17 18 17 18   Temp: 98 F (36.7 C) (!) 97.5 F (36.4 C) 98.5 F (36.9 C) 98.8 F (37.1 C)  TempSrc: Oral Axillary Oral  Oral  SpO2: 99% 100% 100% 100%  Weight:      Height:        Intake/Output Summary (Last 24 hours) at 02/05/2022 1517 Last data filed at 02/05/2022 0500 [place] per 24 hour  Intake 240 ml  Output 400 ml  Net -160 ml    Filed Weights   01/30/22 1945 02/01/22 1439  Weight: 81.6 kg 79 kg    Examination: Calm, NAD Cta no w/r Reg s1/s2 no gallop Soft benign +bs No edema Aaoxox3  Mood and affect appropriate in current setting     Data Reviewed: I have personally reviewed following labs and imaging studies  CBC: Recent Labs  Lab 01/30/22 1943 02/01/22 0408  WBC 4.2 7.3  NEUTROABS 2.5  --   HGB 11.0* 12.3  HCT 32.8* 36.2  MCV 89.9 89.2  PLT 207 741   Basic Metabolic Panel: Recent Labs  Lab 01/30/22 1943 02/01/22 0408  NA 137 134*  K 3.5 4.1  CL 105 101  CO2 23 23  GLUCOSE 214* 210*  BUN 16 12  CREATININE 1.22* 1.01*  CALCIUM 8.6* 9.1   GFR: Estimated Creatinine Clearance: 49.6 mL/min (A) (by C-G formula based on SCr of 1.01 mg/dL (H)). Liver Function Tests: No results for input(s): AST, ALT, ALKPHOS, BILITOT, PROT, ALBUMIN in the last 168 hours. No results for input(s): LIPASE, AMYLASE in the last 168 hours. No results  for input(s): AMMONIA in the last 168 hours. Coagulation Profile: No results for input(s): INR, PROTIME in the last 168 hours. Cardiac Enzymes: No results for input(s): CKTOTAL, CKMB, CKMBINDEX, TROPONINI in the last 168 hours. BNP (last 3 results) No results for input(s): PROBNP in the last 8760 hours. HbA1C: No results for input(s): HGBA1C in the last 72 hours. CBG: Recent Labs  Lab 02/04/22 1230 02/04/22 1620 02/04/22 2046 02/05/22 0733 02/05/22 1143  GLUCAP 174* 228* 152* 202* 144*   Lipid Profile: No results for input(s): CHOL, HDL, LDLCALC, TRIG, CHOLHDL, LDLDIRECT in the last 72 hours. Thyroid Function Tests: No results for input(s): TSH, T4TOTAL, FREET4, T3FREE, THYROIDAB in the last 72 hours. Anemia Panel: No results  for input(s): VITAMINB12, FOLATE, FERRITIN, TIBC, IRON, RETICCTPCT in the last 72 hours. Sepsis Labs: No results for input(s): PROCALCITON, LATICACIDVEN in the last 168 hours.  Recent Results (from the past 240 hour(s))  Resp Panel by RT-PCR (Flu A&B, Covid) Nasopharyngeal Swab     Status: None   Collection Time: 01/30/22  9:01 PM   Specimen: Nasopharyngeal Swab; Nasopharyngeal(NP) swabs in vial transport medium  Result Value Ref Range Status   SARS Coronavirus 2 by RT PCR NEGATIVE NEGATIVE Final    Comment: (NOTE) SARS-CoV-2 target nucleic acids are NOT DETECTED.  The SARS-CoV-2 RNA is generally detectable in upper respiratory specimens during the acute phase of infection. The lowest concentration of SARS-CoV-2 viral copies this assay can detect is 138 copies/mL. A negative result does not preclude SARS-Cov-2 infection and should not be used as the sole basis for treatment or other patient management decisions. A negative result may occur with  improper specimen collection/handling, submission of specimen other than nasopharyngeal swab, presence of viral mutation(s) within the areas targeted by this assay, and inadequate number of viral copies(<138 copies/mL). A negative result must be combined with clinical observations, patient history, and epidemiological information. The expected result is Negative.  Fact Sheet for Patients:  EntrepreneurPulse.com.au  Fact Sheet for Healthcare Providers:  IncredibleEmployment.be  This test is no t yet approved or cleared by the [place] FDA and  has been authorized for detection and/or diagnosis of SARS-CoV-2 by FDA under an Emergency Use Authorization (EUA). This EUA will remain  in effect (meaning this test can be used) for the duration of the COVID-19 declaration under Section 564(b)(1) of the Act, 21 U.S.C.section 360bbb-3(b)(1), unless the authorization is terminated  or revoked sooner.        Influenza A by PCR NEGATIVE NEGATIVE Final   Influenza B by PCR NEGATIVE NEGATIVE Final    Comment: (NOTE) The Xpert Xpress SARS-CoV-2/FLU/RSV plus assay is intended as an aid in the diagnosis of influenza from Nasopharyngeal swab specimens and should not be used as a sole basis for treatment. Nasal washings and aspirates are unacceptable for Xpert Xpress SARS-CoV-2/FLU/RSV testing.  Fact Sheet for Patients: EntrepreneurPulse.com.au  Fact Sheet for Healthcare Providers: IncredibleEmployment.be  This test is not yet approved or cleared by the [place] FDA and has been authorized for detection and/or diagnosis of SARS-CoV-2 by FDA under an Emergency Use Authorization (EUA). This EUA will remain in effect (meaning this test can be used) for the duration of the COVID-19 declaration under Section 564(b)(1) of the Act, 21 U.S.C. section 360bbb-3(b)(1), unless the authorization is terminated or revoked.  Performed at [facility], [address] [place], [place] [address]          Radiology Studies: No results found.  Scheduled Meds:  aspirin  325 mg Oral Daily   buPROPion  300 mg Oral Daily   calamine  1 application Topical BID   enoxaparin (LOVENOX) injection  40 mg Subcutaneous Daily   feeding supplement  237 mL Oral BID BM   gabapentin  100 mg Oral TID   insulin aspart  0-15 Units Subcutaneous TID WC   insulin aspart  0-5 Units Subcutaneous QHS   levothyroxine  50 mcg Oral q morning   lidocaine  2 patch Transdermal Q24H   multivitamin with minerals  1 tablet Oral Daily   sodium chloride flush  3 mL Intravenous Q12H   Continuous Infusions:    Assessment & Plan:   Principal Problem:   Falls frequently Active Problems:   Closed T7 fracture (HCC)   Dementia (HCC)   Diabetes mellitus without complication ([place])   Dyslipidemia   Hypertension   Hypothyroidism (acquired)   Cervical stenosis of spinal  canal   Bilateral hand pain   Falls frequently -Patient reports underlying tremor d/o that is being evaluated by neurology -Also with chronic dizziness that seems to be exacerbated by medications (amitiptyline) 2/20 PT recommends SNF      Cervical stenosis of spinal canal- (present on admission) Neurosurgery was following 2/20 plan for subacute rehab      Closed T7 fracture ([place])- (present on admission) TLSO brace for comfort Per neurosurgery T7 fracture is currently not unstable Pain control Can consider steroids and gabapentin teen continue with lidocaine patches, gabapentin Increase gabapentin as tolerated slowly 2/20 pain better controlled  Follow-up with neurosurgery as outpatient few weeks         Hypothyroidism (acquired)- (present on admission) Continue Synthroid   Bilateral hand pain- (present on admission) -Patient with c/o B hand pain -She does report [facility] but her symptoms do not appear to be traumatic in nature -Imaging shows possible scapholunate ligamentous injury on the left with negative imaging on the right; this would like cause thumb/index finger issues but not 3-5 finger symptoms - which she is complaining of -Can try a removal wrist splint for comfort but likely chronic issue and she can do outpatient orthopedic f/u if it remains a problem (discussed with Silvestre Gunner)   Hypertension- (present on admission) -Hold Bumex for now -Will cover with prn IV hydralazine     Dyslipidemia- (present on admission) -On Repatha as an outpateint   Diabetes mellitus without complication ([place]) -Recent A1c was 7.4 -She does not appear to be taking outpatient medications -Will cover with moderate-scale SSI for now   Dementia [facility])- (present on admission) -She has apparent cognitive impairment and this was reported by neurologist recently as well -Delirium precautions ordered -Continue Wellbutrin, Restoril            DVT prophylaxis:  Lovenox Code Status:full Family Communication: None at bedside Disposition Plan: SNF Status is: Inpatient Remains inpatient appropriate because: Unsafe discharge.  Needs SNF              LOS: 5 days   Time spent: 35 min    Nolberto Hanlon, MD [facility] Pager [phone]  If 7PM-7AM, please contact night-coverage 02/05/2022, 3:17 PM

## 2022-02-05 NOTE — Progress Notes (Signed)
Physical Therapy Treatment Patient Details Name: [name] MRN: [id number] DOB: 1951-10-04 Today's Date: 02/05/2022   History of Present Illness This 71 y.o. female admitted after falling over a cord and striking the top of her head on 2/14.   MRI of the thoracic spine reveals likely acute to subacute superior endplate fracture at T7 without significant loss of height.  There is also chronic compression fracture at L1. CT of head negative for acute abnormality.  X-ray of hand shows possible scapholunate ligamentous injury on the left (likely chronic) with negative imaging Rt hand.  MRI of C-Spine shows severe neural foraminal stenosis C5-6 (NS following).  PMH includes:  HTN, DM, dementia, h/o frequent falls, migraine, h/o dizziness. parkinson's like tremor - followed by neurology OP    PT Comments    Pt was seen for mobility in room with TLSO and L wrist splint donned with PT.  Pt is mainly complaining of pain in shoulders and neck, but is sitting up without brace and slumping a bit.  Repositioning done with braces and pt is then more comfortable.  Her plan continues to be CIR due to her ongoing requirement for help to move, and her limited recall of instructed information.  Pt is going to have family assist per her previous information.  Follow acutely for goals of PT with focus on balance and endurance to support safety with gait.    Recommendations for follow up therapy are one component of a multi-disciplinary discharge planning process, led by the attending physician.  Recommendations may be updated based on patient status, additional functional criteria and insurance authorization.  Follow Up Recommendations  Acute inpatient rehab (3hours/day)     Assistance Recommended at Discharge Frequent or constant Supervision/Assistance  Patient can return home with the following Assistance with cooking/housework;Assist for transportation;Help with stairs or ramp for entrance;A little help with  walking and/or transfers;A lot of help with bathing/dressing/bathroom   Equipment Recommendations  None recommended by PT    Recommendations for Other Services Rehab consult     Precautions / Restrictions Precautions Precautions: Back;Fall Precaution Booklet Issued: Yes (comment) Required Braces or Orthoses: Splint/Cast;Spinal Brace (L wrist brace for pain in room) Spinal Brace: Thoracolumbosacral orthotic Restrictions Weight Bearing Restrictions: No     Mobility  Bed Mobility Overal bed mobility: Needs Assistance             General bed mobility comments: up in chair when PT arrives    Transfers Overall transfer level: Needs assistance Equipment used: Rolling walker (2 wheels) Transfers: Sit to/from Stand Sit to Stand: Min assist           General transfer comment: min assist with cues for hand placement, without them does not recall them    Ambulation/Gait Ambulation/Gait assistance: Min assist Gait Distance (Feet): 45 Feet Assistive device: Rolling walker (2 wheels), 1 person hand held assist Gait Pattern/deviations: Step-through pattern, Step-to pattern, Decreased stride length, Wide base of support Gait velocity: reduced Gait velocity interpretation: <1.31 ft/sec, indicative of household ambulator   General Gait Details: worked on direction cues, slow to respond to them and to change directions, to stop, to use hand placement to sit safely   Stairs             Wheelchair Mobility    Modified Rankin (Stroke Patients Only)       Balance Overall balance assessment: Needs assistance Sitting-balance support: Feet supported Sitting balance-Leahy Scale: Good Sitting balance - Comments: applied TLSO in sitting   Standing  balance support: During functional activity, Bilateral upper extremity supported Standing balance-Leahy Scale: Fair Standing balance comment: fair with walker, poor without                            Cognition  Arousal/Alertness: Awake/alert Behavior During Therapy: WFL for tasks assessed/performed, Anxious Overall Cognitive Status: No family/caregiver present to determine baseline cognitive functioning                                 General Comments: requires somewhat repetitive cues for mobility particularly directions, but is aware of her braces and the expectation of wearing them        Exercises      General Comments General comments (skin integrity, edema, etc.): Pt is now in possession of the L wrist brace, and donned it for comfort although pt is not able to repeat the instructions for application      Pertinent Vitals/Pain Pain Assessment Pain Assessment: Faces Faces Pain Scale: Hurts little more Breathing: normal Negative Vocalization: none Facial Expression: smiling or inexpressive Body Language: relaxed Consolability: no need to console PAINAD Score: 0 Pain Location: shoulders and neck Pain Descriptors / Indicators: Guarding, Grimacing Pain Intervention(s): Monitored during session, Repositioned    Home Living                          Prior Function            PT Goals (current goals can now be found in the care plan section) Acute Rehab PT Goals Patient Stated Goal: to get pain gone Progress towards PT goals: Progressing toward goals    Frequency    Min 3X/week      PT Plan Current plan remains appropriate    Co-evaluation              AM-PAC PT "6 Clicks" Mobility   Outcome Measure  Help needed turning from your back to your side while in a flat bed without using bedrails?: A Little Help needed moving from lying on your back to sitting on the side of a flat bed without using bedrails?: A Lot Help needed moving to and from a bed to a chair (including a wheelchair)?: A Lot Help needed standing up from a chair using your arms (e.g., wheelchair or bedside chair)?: A Lot Help needed to walk in hospital room?: A Lot Help  needed climbing 3-5 steps with a railing? : A Lot 6 Click Score: 13    End of Session Equipment Utilized During Treatment: Gait belt;Back brace;Other (comment) (L wrist splint) Activity Tolerance: Patient tolerated treatment well;Other (comment) (ablity to follow directions, requiring cues) Patient left: with call bell/phone within reach;in chair;with chair alarm set Nurse Communication: Mobility status;Other (comment) (note  to discharge planning) PT Visit Diagnosis: Unsteadiness on feet (R26.81);Other abnormalities of gait and mobility (R26.89);Muscle weakness (generalized) (M62.81);Difficulty in walking, not elsewhere classified (R26.2);Pain Pain - Right/Left:  (neck and shoulders) Pain - part of body: Shoulder     Time: 5993-5701 PT Time Calculation (min) (ACUTE ONLY): 17 min  Charges:  $Gait Training: 8-22 mins          Ramond Dial 02/05/2022, 1:26 PM  Mee Hives, PT PhD Acute Rehab Dept. Number: [place] [facility] [place]

## 2022-02-05 NOTE — Progress Notes (Signed)
Orthopedic Tech Progress Note Patient Details:  [name] [birth year] [id number]  Ortho Devices Type of Ortho Device: Velcro wrist splint Ortho Device/Splint Location: delivered to rn Ortho Device/Splint Interventions: Renard Matter 02/05/2022, 6:08 AM

## 2022-02-06 DIAGNOSIS — R296 Repeated falls: Secondary | ICD-10-CM | POA: Diagnosis not present

## 2022-02-06 LAB — GLUCOSE, CAPILLARY
Glucose-Capillary: 154 mg/dL — ABNORMAL HIGH (ref 70–99)
Glucose-Capillary: 207 mg/dL — ABNORMAL HIGH (ref 70–99)
Glucose-Capillary: 95 mg/dL (ref 70–99)
Glucose-Capillary: 184 mg/dL — ABNORMAL HIGH (ref 70–99)

## 2022-02-06 MED ORDER — GLUCERNA SHAKE PO LIQD
237.0000 mL | Freq: Two times a day (BID) | ORAL | Status: DC
Start: 2022-02-06 — End: 2022-02-07
  Administered 2022-02-07: 237 mL via ORAL

## 2022-02-06 NOTE — Progress Notes (Signed)
PROGRESS NOTE    [name]  XBJ:[id number] DOB: Feb 20, 1951 DOA: 01/30/2022 PCP: System, Provider Not In    Brief Narrative:  [name] is a 71 y.o. female with medical history significant of DM, HTN, dementia, and pernicious anemia presenting with a fall.  She has been sick for a long time.  She went off amitriptyline for migraines and was 50% less dizzy and so wasn't falling TID.  She took some brain herbal for 5 days and it started making her dizzy (less than amitriptyline).  She came into the house and tripped.   She tried to break her fall with her hand.  Both hands are hurting severely.  She hasn't been able to sleep off the amitriptyline.  She is hurting in her shoulders.  She saw a neurosurgeon 8 months ago and "a compressed something" and there was a question about FARS.  She has Parkinson's-type symptoms.  She is having tremors at baseline.  She thinks she is not a candidate for surgery - elderly, anesthesia complications, calcifications in her brain.  She has decided she wants to see someone in [place].  Has been here for pain mx which now controlled. Did peer to peer for CIR which was declined. [employer] sent appeal. Waiting for response. Medically stable to dc when placement resolved.  Consultants:  Neurosurgery  Procedures:   Antimicrobials:      Subjective: Pain controlled. No new complaints.   Objective: Vitals:   02/06/22 0017 02/06/22 0353 02/06/22 0750 02/06/22 1306  BP: (!) 143/81 134/66 (!) 146/79 126/62  Pulse: 86 84 86 83  Resp: 18 18 18 17   Temp: 98.4 F (36.9 C)  98 F (36.7 C) 98.2 F (36.8 C)  TempSrc: Oral  Oral Oral  SpO2: 100% 100% 97% 99%  Weight:      Height:        Intake/Output Summary (Last 24 hours) at 02/06/2022 1638 Last data filed at 02/06/2022 0900 Gross per 24 hour  Intake 200 ml  Output --  Net 200 ml    Filed Weights   01/30/22 1945 02/01/22 1439  Weight: 81.6 kg 79 kg    Examination:  Calm, NAD Cta no w/r Reg  s1/s2 no gallop Soft benign +bs No edema Aaoxox3  Mood and affect appropriate in current setting     Data Reviewed: I have personally reviewed following labs and imaging studies  CBC: Recent Labs  Lab 01/30/22 1943 02/01/22 0408  WBC 4.2 7.3  NEUTROABS 2.5  --   HGB 11.0* 12.3  HCT 32.8* 36.2  MCV 89.9 89.2  PLT 207 308   Basic Metabolic Panel: Recent Labs  Lab 01/30/22 1943 02/01/22 0408  NA 137 134*  K 3.5 4.1  CL 105 101  CO2 23 23  GLUCOSE 214* 210*  BUN 16 12  CREATININE 1.22* 1.01*  CALCIUM 8.6* 9.1   GFR: Estimated Creatinine Clearance: 49.6 mL/min (A) (by C-G formula based on SCr of 1.01 mg/dL (H)). Liver Function Tests: No results for input(s): AST, ALT, ALKPHOS, BILITOT, PROT, ALBUMIN in the last 168 hours. No results for input(s): LIPASE, AMYLASE in the last 168 hours. No results for input(s): AMMONIA in the last 168 hours. Coagulation Profile: No results for input(s): INR, PROTIME in the last 168 hours. Cardiac Enzymes: No results for input(s): CKTOTAL, CKMB, CKMBINDEX, TROPONINI in the last 168 hours. BNP (last 3 results) No results for input(s): PROBNP in the last 8760 hours. HbA1C: No results for input(s): HGBA1C in the last  72 hours. CBG: Recent Labs  Lab 02/05/22 1615 02/05/22 2056 02/06/22 0748 02/06/22 1123 02/06/22 1625  GLUCAP 200* 157* 154* 207* 95   Lipid Profile: No results for input(s): CHOL, HDL, LDLCALC, TRIG, CHOLHDL, LDLDIRECT in the last 72 hours. Thyroid Function Tests: No results for input(s): TSH, T4TOTAL, FREET4, T3FREE, THYROIDAB in the last 72 hours. Anemia Panel: No results for input(s): VITAMINB12, FOLATE, FERRITIN, TIBC, IRON, RETICCTPCT in the last 72 hours. Sepsis Labs: No results for input(s): PROCALCITON, LATICACIDVEN in the last 168 hours.  Recent Results (from the past 240 hour(s))  Resp Panel by RT-PCR (Flu A&B, Covid) Nasopharyngeal Swab     Status: None   Collection Time: 01/30/22  9:01 PM    Specimen: Nasopharyngeal Swab; Nasopharyngeal(NP) swabs in vial transport medium  Result Value Ref Range Status   SARS Coronavirus 2 by RT PCR NEGATIVE NEGATIVE Final    Comment: (NOTE) SARS-CoV-2 target nucleic acids are NOT DETECTED.  The SARS-CoV-2 RNA is generally detectable in upper respiratory specimens during the acute phase of infection. The lowest concentration of SARS-CoV-2 viral copies this assay can detect is 138 copies/mL. A negative result does not preclude SARS-Cov-2 infection and should not be used as the sole basis for treatment or other patient management decisions. A negative result may occur with  improper specimen collection/handling, submission of specimen other than nasopharyngeal swab, presence of viral mutation(s) within the areas targeted by this assay, and inadequate number of viral copies(<138 copies/mL). A negative result must be combined with clinical observations, patient history, and epidemiological information. The expected result is Negative.  Fact Sheet for Patients:  EntrepreneurPulse.com.au  Fact Sheet for Healthcare Providers:  IncredibleEmployment.be  This test is no t yet approved or cleared by the [place] FDA and  has been authorized for detection and/or diagnosis of SARS-CoV-2 by FDA under an Emergency Use Authorization (EUA). This EUA will remain  in effect (meaning this test can be used) for the duration of the COVID-19 declaration under Section 564(b)(1) of the Act, 21 U.S.C.section 360bbb-3(b)(1), unless the authorization is terminated  or revoked sooner.       Influenza A by PCR NEGATIVE NEGATIVE Final   Influenza B by PCR NEGATIVE NEGATIVE Final    Comment: (NOTE) The Xpert Xpress SARS-CoV-2/FLU/RSV plus assay is intended as an aid in the diagnosis of influenza from Nasopharyngeal swab specimens and should not be used as a sole basis for treatment. Nasal washings and aspirates are  unacceptable for Xpert Xpress SARS-CoV-2/FLU/RSV testing.  Fact Sheet for Patients: EntrepreneurPulse.com.au  Fact Sheet for Healthcare Providers: IncredibleEmployment.be  This test is not yet approved or cleared by the [place] FDA and has been authorized for detection and/or diagnosis of SARS-CoV-2 by FDA under an Emergency Use Authorization (EUA). This EUA will remain in effect (meaning this test can be used) for the duration of the COVID-19 declaration under Section 564(b)(1) of the Act, 21 U.S.C. section 360bbb-3(b)(1), unless the authorization is terminated or revoked.  Performed at [facility], [address], [place], [place] [address]          Radiology Studies: No results found.      Scheduled Meds:  aspirin  325 mg Oral Daily   buPROPion  300 mg Oral Daily   calamine  1 application Topical BID   enoxaparin (LOVENOX) injection  40 mg Subcutaneous Daily   feeding supplement (GLUCERNA SHAKE)  237 mL Oral BID BM   gabapentin  100 mg Oral TID  insulin aspart  0-15 Units Subcutaneous TID WC   insulin aspart  0-5 Units Subcutaneous QHS   levothyroxine  50 mcg Oral q morning   lidocaine  2 patch Transdermal Q24H   multivitamin with minerals  1 tablet Oral Daily   sodium chloride flush  3 mL Intravenous Q12H   Continuous Infusions:    Assessment & Plan:   Principal Problem:   Falls frequently Active Problems:   Closed T7 fracture (HCC)   Dementia (HCC)   Diabetes mellitus without complication (HCC)   Dyslipidemia   Hypertension   Hypothyroidism (acquired)   Cervical stenosis of spinal canal   Bilateral hand pain   Malnutrition of moderate degree   Falls frequently -Patient reports underlying tremor d/o that is being evaluated by neurology -Also with chronic dizziness that seems to be exacerbated by medications (amitiptyline) 2/21 PT recommends SNF/CIR pending       Cervical stenosis of  spinal canal- (present on admission) Neurosurgery was following 2/21 plan for subacute rehab/CIR     Closed T7 fracture ([place])- (present on admission) TLSO brace for comfort Per neurosurgery T7 fracture is currently not unstable Pain control Can consider steroids and gabapentin teen continue with lidocaine patches, gabapentin 2/21 pain better controlled If need to can increase gabapentin as tolerated slowly Follow-up neurosurgery as outpatient in few weeks Disposition CIR/subacute rehab           Hypothyroidism (acquired)- (present on admission) Continue Synthroid   Bilateral hand pain- (present on admission) -Patient with c/o B hand pain -She does report [facility] but her symptoms do not appear to be traumatic in nature -Imaging shows possible scapholunate ligamentous injury on the left with negative imaging on the right; this would like cause thumb/index finger issues but not 3-5 finger symptoms - which she is complaining of -Can try a removal wrist splint for comfort but likely chronic issue and she can do outpatient orthopedic f/u if it remains a problem (discussed with Silvestre Gunner)   Hypertension- (present on admission) -Hold Bumex for now -Will cover with prn IV hydralazine     Dyslipidemia- (present on admission) -On Repatha as an outpateint   Diabetes mellitus without complication ([place]) -Recent A1c was 7.4 -She does not appear to be taking outpatient medications -Will cover with moderate-scale SSI for now   Dementia [facility]- (present on admission) -She has apparent cognitive impairment and this was reported by neurologist recently as well -Delirium precautions ordered -Continue Wellbutrin, Restoril            DVT prophylaxis: Lovenox Code Status:full Family Communication: None at bedside Disposition Plan: SNF Status is: Inpatient Remains inpatient appropriate because: Unsafe discharge.  Needs SNF/CIR placement pending              LOS:  6 days   Time spent: 35 min    Nolberto Hanlon, MD [facility] Pager [phone]  If 7PM-7AM, please contact night-coverage 02/06/2022, 4:38 PM

## 2022-02-06 NOTE — Plan of Care (Signed)

## 2022-02-06 NOTE — Progress Notes (Signed)
Mobility Specialist Progress Note    02/06/22 1501  Mobility  Bed Position Chair  Activity Ambulated with assistance in hallway  Level of Assistance Contact guard assist, steadying assist  Assistive Device Front wheel walker  Distance Ambulated (ft) 550 ft  Activity Response Tolerated fair  $Mobility charge 1 Mobility   Pt received coming out of BR with NT present and agreeable. C/o brace bothering her while sitting up in the chair and hurting her chin. Need minimal cueing for avoiding objects. Left in chair with CSW present.   [facility] Mobility Specialist  M.S. 5N: [phone]

## 2022-02-06 NOTE — Progress Notes (Signed)
Speech Language Pathology Treatment: Cognitive-Linquistic  Patient Details Name: [name] MRN: [id number] DOB: May 12, 1951 Today's Date: 02/06/2022 Time: 0045-9977 SLP Time Calculation (min) (ACUTE ONLY): 30 min  Assessment / Plan / Recommendation Clinical Impression  Pt was seen for f/u cognitive tx, working on bathing upon SLP arrival. Pt showed awareness that bed alarm would go off if she tried to stand up without help, but is also very confident in her abilities and saying that she does not need help. SLP provided Min cues for anticipatory awareness during tasks, needing a little more assistance to think more abstractly about possible situations post-discharge. Min cues also provided for simple verbal problem solving tasks pertaining to safety. Pt acknowledges that she has had a little trouble at home PTA, ever since she a surgery she had ~1.5 years ago, but that she notices mild acute changes since fall. Will continue to follow acutely, recommending AIR level therapy upon discharge to maximize cognition and safety.    HPI HPI: Corrin Sarver is a 71 y.o. female with medical history significant of DM, HTN, dementia, and pernicious anemia presenting with a fall. Per chart she has Parkinson's-type symptoms. CT no acute abnormality, small right frontal scalp hematoma without skull fx.      SLP Plan  Continue with current plan of care      Recommendations for follow up therapy are one component of a multi-disciplinary discharge planning process, led by the attending physician.  Recommendations may be updated based on patient status, additional functional criteria and insurance authorization.    Recommendations                   Follow Up Recommendations: Acute inpatient rehab (3hours/day) Assistance recommended at discharge: Intermittent Supervision/Assistance SLP Visit Diagnosis: Cognitive communication deficit (S14.239) Plan: Continue with current plan of care            Osie Bond., M.A. [facility] Pager [phone] Office [phone]  02/06/2022, 10:30 AM

## 2022-02-06 NOTE — Progress Notes (Signed)
Inpatient Rehab Admissions Coordinator:   I do not have insurance auth or a CIR bed for this Pt. Today. Insurance denied our initial request for CIR and Pt. Stated desire to appeal this decision. I filed expedited appeal on 02/05/22 at 3pm. Expedited appeals typically take 1-2 days, but payor can take up to 3 days to render a decision. I will continue to follow for potential admission pending insurance auth and bed availability.   Clemens Catholic, [place], [place] Admissions Coordinator  [phone] ([place]) [phone] (office)

## 2022-02-06 NOTE — TOC Progression Note (Signed)
Transition of Care [facility]) - Progression Note    Patient Details  Name: [name] MRN: [id number] Date of Birth: 09/08/1951  [facility] CM/SW Contact  Joanne Chars, LCSW Phone Number: 02/06/2022, 3:07 PM  Clinical Narrative:   Insurance approval for CIR continues to be pending appeal.  CSW spoke with pt about back up plan if the appeal is denied.  Pt does not want to go to SNF.  If denied, pt would want to go home with [facility].  Pt reports her daughter, who has schizophrenia, is available to provide some level of support.  Pt granddaughter is also available to help.  Pt using walker to get around and could manage at home for short times when neither one was there.      Expected Discharge Plan: IP Rehab Facility Barriers to Discharge: Continued Medical Work up  Expected Discharge Plan and Services Expected Discharge Plan: [place]   Discharge Planning Services: CM Consult Post Acute Care Choice: Durable Medical Equipment Living arrangements for the past 2 months: Single Family Home                                       Social Determinants of Health (SDOH) Interventions    Readmission Risk Interventions No flowsheet data found.

## 2022-02-07 ENCOUNTER — Encounter (HOSPITAL_COMMUNITY): Payer: Self-pay | Admitting: Family Medicine

## 2022-02-07 ENCOUNTER — Encounter: Payer: Self-pay | Admitting: Internal Medicine

## 2022-02-07 ENCOUNTER — Encounter (HOSPITAL_COMMUNITY): Payer: Self-pay | Admitting: Physical Medicine and Rehabilitation

## 2022-02-07 ENCOUNTER — Inpatient Hospital Stay (HOSPITAL_COMMUNITY)
Admission: RE | Admit: 2022-02-07 | Discharge: 2022-02-13 | DRG: 560 | Disposition: A | Discharge status: Home Health | Payer: Medicare Other | Source: Intra-hospital | Attending: Physical Medicine and Rehabilitation | Admitting: Physical Medicine and Rehabilitation

## 2022-02-07 ENCOUNTER — Other Ambulatory Visit: Payer: Self-pay

## 2022-02-07 DIAGNOSIS — Y92009 Unspecified place in unspecified non-institutional (private) residence as the place of occurrence of the external cause: Secondary | ICD-10-CM

## 2022-02-07 DIAGNOSIS — E1165 Type 2 diabetes mellitus with hyperglycemia: Secondary | ICD-10-CM | POA: Diagnosis present

## 2022-02-07 DIAGNOSIS — N179 Acute kidney failure, unspecified: Secondary | ICD-10-CM | POA: Diagnosis present

## 2022-02-07 DIAGNOSIS — E785 Hyperlipidemia, unspecified: Secondary | ICD-10-CM | POA: Diagnosis present

## 2022-02-07 DIAGNOSIS — I951 Orthostatic hypotension: Secondary | ICD-10-CM | POA: Diagnosis present

## 2022-02-07 DIAGNOSIS — Z794 Long term (current) use of insulin: Secondary | ICD-10-CM | POA: Diagnosis not present

## 2022-02-07 DIAGNOSIS — F32A Depression, unspecified: Secondary | ICD-10-CM

## 2022-02-07 DIAGNOSIS — Z8052 Family history of malignant neoplasm of bladder: Secondary | ICD-10-CM

## 2022-02-07 DIAGNOSIS — Z8249 Family history of ischemic heart disease and other diseases of the circulatory system: Secondary | ICD-10-CM

## 2022-02-07 DIAGNOSIS — S22069S Unspecified fracture of T7-T8 vertebra, sequela: Secondary | ICD-10-CM

## 2022-02-07 DIAGNOSIS — S7290XA Unspecified fracture of unspecified femur, initial encounter for closed fracture: Secondary | ICD-10-CM | POA: Diagnosis not present

## 2022-02-07 DIAGNOSIS — Z882 Allergy status to sulfonamides status: Secondary | ICD-10-CM

## 2022-02-07 DIAGNOSIS — Z800 Family history of malignant neoplasm of digestive organs: Secondary | ICD-10-CM

## 2022-02-07 DIAGNOSIS — K219 Gastro-esophageal reflux disease without esophagitis: Secondary | ICD-10-CM | POA: Diagnosis present

## 2022-02-07 DIAGNOSIS — S22069A Unspecified fracture of T7-T8 vertebra, initial encounter for closed fracture: Secondary | ICD-10-CM | POA: Diagnosis present

## 2022-02-07 DIAGNOSIS — E039 Hypothyroidism, unspecified: Secondary | ICD-10-CM | POA: Diagnosis present

## 2022-02-07 DIAGNOSIS — S22069D Unspecified fracture of T7-T8 vertebra, subsequent encounter for fracture with routine healing: Secondary | ICD-10-CM | POA: Diagnosis present

## 2022-02-07 DIAGNOSIS — S22068S Other fracture of T7-T8 thoracic vertebra, sequela: Secondary | ICD-10-CM | POA: Diagnosis not present

## 2022-02-07 DIAGNOSIS — M8588 Other specified disorders of bone density and structure, other site: Secondary | ICD-10-CM | POA: Diagnosis present

## 2022-02-07 DIAGNOSIS — G4700 Insomnia, unspecified: Secondary | ICD-10-CM | POA: Diagnosis present

## 2022-02-07 DIAGNOSIS — Z888 Allergy status to other drugs, medicaments and biological substances status: Secondary | ICD-10-CM | POA: Diagnosis not present

## 2022-02-07 DIAGNOSIS — Z833 Family history of diabetes mellitus: Secondary | ICD-10-CM

## 2022-02-07 DIAGNOSIS — M4802 Spinal stenosis, cervical region: Secondary | ICD-10-CM | POA: Diagnosis not present

## 2022-02-07 DIAGNOSIS — M792 Neuralgia and neuritis, unspecified: Secondary | ICD-10-CM

## 2022-02-07 DIAGNOSIS — K5900 Constipation, unspecified: Secondary | ICD-10-CM | POA: Diagnosis present

## 2022-02-07 DIAGNOSIS — Z7982 Long term (current) use of aspirin: Secondary | ICD-10-CM

## 2022-02-07 DIAGNOSIS — I10 Essential (primary) hypertension: Secondary | ICD-10-CM | POA: Diagnosis present

## 2022-02-07 DIAGNOSIS — R296 Repeated falls: Secondary | ICD-10-CM | POA: Diagnosis not present

## 2022-02-07 DIAGNOSIS — W19XXXD Unspecified fall, subsequent encounter: Secondary | ICD-10-CM | POA: Diagnosis present

## 2022-02-07 DIAGNOSIS — R251 Tremor, unspecified: Secondary | ICD-10-CM | POA: Diagnosis present

## 2022-02-07 DIAGNOSIS — S0181XD Laceration without foreign body of other part of head, subsequent encounter: Secondary | ICD-10-CM | POA: Diagnosis not present

## 2022-02-07 DIAGNOSIS — S22068A Other fracture of T7-T8 thoracic vertebra, initial encounter for closed fracture: Secondary | ICD-10-CM

## 2022-02-07 DIAGNOSIS — Z79899 Other long term (current) drug therapy: Secondary | ICD-10-CM

## 2022-02-07 DIAGNOSIS — S7291XA Unspecified fracture of right femur, initial encounter for closed fracture: Secondary | ICD-10-CM | POA: Diagnosis not present

## 2022-02-07 DIAGNOSIS — Z79890 Hormone replacement therapy (postmenopausal): Secondary | ICD-10-CM | POA: Diagnosis not present

## 2022-02-07 DIAGNOSIS — D510 Vitamin B12 deficiency anemia due to intrinsic factor deficiency: Secondary | ICD-10-CM | POA: Diagnosis present

## 2022-02-07 DIAGNOSIS — E440 Moderate protein-calorie malnutrition: Secondary | ICD-10-CM | POA: Diagnosis present

## 2022-02-07 LAB — GLUCOSE, CAPILLARY
Glucose-Capillary: 155 mg/dL — ABNORMAL HIGH (ref 70–99)
Glucose-Capillary: 211 mg/dL — ABNORMAL HIGH (ref 70–99)
Glucose-Capillary: 159 mg/dL — ABNORMAL HIGH (ref 70–99)
Glucose-Capillary: 149 mg/dL — ABNORMAL HIGH (ref 70–99)

## 2022-02-07 LAB — CREATININE, SERUM
Creatinine, Ser: 1.11 mg/dL — ABNORMAL HIGH (ref 0.44–1.00)
GFR, Estimated: 53 mL/min — ABNORMAL LOW (ref >60)

## 2022-02-07 MED ORDER — ADULT MULTIVITAMIN W/MINERALS CH: 1.0000 | ORAL_TABLET | Freq: Every day | ORAL | Status: DC

## 2022-02-07 MED ORDER — LIDOCAINE 5 % EX PTCH
2.0000 | MEDICATED_PATCH | CUTANEOUS | Status: DC
  Administered 2022-02-07 – 2022-02-12 (×3): 2 via TRANSDERMAL
  Filled 2022-02-07 (×5): qty 2

## 2022-02-07 MED ORDER — OXYCODONE HCL 5 MG PO TABS
5.0000 mg | ORAL_TABLET | ORAL | Status: DC | PRN
  Administered 2022-02-07 – 2022-02-09 (×8): 5 mg via ORAL
  Filled 2022-02-07 (×8): qty 1

## 2022-02-07 MED ORDER — ADULT MULTIVITAMIN W/MINERALS CH
1.0000 | ORAL_TABLET | Freq: Every day | ORAL | Status: DC
  Administered 2022-02-08 – 2022-02-13 (×6): 1 via ORAL
  Filled 2022-02-07 (×6): qty 1

## 2022-02-07 MED ORDER — BUPROPION HCL ER (XL) 150 MG PO TB24
300.0000 mg | ORAL_TABLET | Freq: Every day | ORAL | Status: DC
  Administered 2022-02-08 – 2022-02-09 (×2): 300 mg via ORAL
  Filled 2022-02-07 (×3): qty 2

## 2022-02-07 MED ORDER — FLEET ENEMA 7-19 GM/118ML RE ENEM: 1.0000 | ENEMA | Freq: Once | RECTAL | Status: DC | PRN

## 2022-02-07 MED ORDER — ALUM & MAG HYDROXIDE-SIMETH 200-200-20 MG/5ML PO SUSP: 30.0000 mL | ORAL | Status: DC | PRN

## 2022-02-07 MED ORDER — PROCHLORPERAZINE MALEATE 5 MG PO TABS: 5.0000 mg | ORAL_TABLET | Freq: Four times a day (QID) | ORAL | Status: DC | PRN

## 2022-02-07 MED ORDER — GLUCERNA SHAKE PO LIQD
237.0000 mL | Freq: Two times a day (BID) | ORAL | Status: DC
  Administered 2022-02-08 (×2): 237 mL via ORAL

## 2022-02-07 MED ORDER — ASPIRIN EC 325 MG PO TBEC
325.0000 mg | DELAYED_RELEASE_TABLET | Freq: Every day | ORAL | Status: DC
  Administered 2022-02-08 – 2022-02-13 (×6): 325 mg via ORAL
  Filled 2022-02-07 (×6): qty 1

## 2022-02-07 MED ORDER — PROCHLORPERAZINE 25 MG RE SUPP
12.5000 mg | Freq: Four times a day (QID) | RECTAL | Status: DC | PRN
Start: 2022-02-07 — End: 2022-02-13

## 2022-02-07 MED ORDER — GUAIFENESIN-DM 100-10 MG/5ML PO SYRP: 5.0000 mL | ORAL_SOLUTION | Freq: Four times a day (QID) | ORAL | Status: DC | PRN

## 2022-02-07 MED ORDER — ENOXAPARIN SODIUM 40 MG/0.4ML IJ SOSY
40.0000 mg | PREFILLED_SYRINGE | Freq: Every day | INTRAMUSCULAR | Status: DC
  Administered 2022-02-08 – 2022-02-13 (×6): 40 mg via SUBCUTANEOUS
  Filled 2022-02-07 (×6): qty 0.4

## 2022-02-07 MED ORDER — TRAZODONE HCL 50 MG PO TABS: 25.0000 mg | ORAL_TABLET | Freq: Every evening | ORAL | Status: DC | PRN

## 2022-02-07 MED ORDER — INSULIN ASPART 100 UNIT/ML IJ SOLN: 0.0000 [IU] | Freq: Three times a day (TID) | INTRAMUSCULAR | 11 refills | Status: DC

## 2022-02-07 MED ORDER — INSULIN ASPART 100 UNIT/ML IJ SOLN: 0.0000 [IU] | Freq: Every day | INTRAMUSCULAR | 11 refills | Status: DC

## 2022-02-07 MED ORDER — GABAPENTIN 100 MG PO CAPS
100.0000 mg | ORAL_CAPSULE | Freq: Three times a day (TID) | ORAL | Status: DC
  Administered 2022-02-07 – 2022-02-09 (×7): 100 mg via ORAL
  Filled 2022-02-07 (×7): qty 1

## 2022-02-07 MED ORDER — INSULIN ASPART 100 UNIT/ML IJ SOLN: 0.0000 [IU] | Freq: Every day | INTRAMUSCULAR | Status: DC

## 2022-02-07 MED ORDER — SORBITOL 70 % SOLN: 30.0000 mL | Freq: Every day | Status: DC | PRN

## 2022-02-07 MED ORDER — OXYCODONE-ACETAMINOPHEN 7.5-325 MG PO TABS
1.0000 | ORAL_TABLET | Freq: Four times a day (QID) | ORAL | Status: DC
  Administered 2022-02-07: 1 via ORAL
  Filled 2022-02-07: qty 1

## 2022-02-07 MED ORDER — GLUCERNA SHAKE PO LIQD: 237.0000 mL | Freq: Two times a day (BID) | ORAL | 0 refills | Status: DC

## 2022-02-07 MED ORDER — POLYETHYLENE GLYCOL 3350 17 G PO PACK: 17.0000 g | PACK | Freq: Every day | ORAL | Status: DC | PRN

## 2022-02-07 MED ORDER — OXYCODONE-ACETAMINOPHEN 7.5-325 MG PO TABS
1.0000 | ORAL_TABLET | Freq: Four times a day (QID) | ORAL | 0 refills | Status: DC
Start: 2022-02-07 — End: 2022-02-13

## 2022-02-07 MED ORDER — CALAMINE EX LOTN
1.0000 "application " | TOPICAL_LOTION | Freq: Two times a day (BID) | CUTANEOUS | Status: DC
  Administered 2022-02-07 – 2022-02-13 (×9): 1 via TOPICAL
  Filled 2022-02-07: qty 177

## 2022-02-07 MED ORDER — METHOCARBAMOL 500 MG PO TABS: 500.0000 mg | ORAL_TABLET | Freq: Four times a day (QID) | ORAL | Status: DC | PRN

## 2022-02-07 MED ORDER — INSULIN ASPART 100 UNIT/ML IJ SOLN
0.0000 [IU] | Freq: Three times a day (TID) | INTRAMUSCULAR | Status: DC
  Administered 2022-02-07 – 2022-02-08 (×3): 3 [IU] via SUBCUTANEOUS
  Administered 2022-02-08: 2 [IU] via SUBCUTANEOUS
  Administered 2022-02-09: 5 [IU] via SUBCUTANEOUS
  Administered 2022-02-09: 2 [IU] via SUBCUTANEOUS
  Administered 2022-02-09 – 2022-02-11 (×7): 3 [IU] via SUBCUTANEOUS
  Administered 2022-02-12: 2 [IU] via SUBCUTANEOUS
  Administered 2022-02-12: 3 [IU] via SUBCUTANEOUS
  Administered 2022-02-12: 2 [IU] via SUBCUTANEOUS
  Administered 2022-02-13: 3 [IU] via SUBCUTANEOUS

## 2022-02-07 MED ORDER — GABAPENTIN 100 MG PO CAPS: 100.0000 mg | ORAL_CAPSULE | Freq: Three times a day (TID) | ORAL | Status: DC

## 2022-02-07 MED ORDER — OXYCODONE HCL 5 MG PO TABS: 5.0000 mg | ORAL_TABLET | ORAL | 0 refills | Status: DC | PRN

## 2022-02-07 MED ORDER — ENOXAPARIN SODIUM 40 MG/0.4ML IJ SOSY: 40.0000 mg | PREFILLED_SYRINGE | INTRAMUSCULAR | Status: DC

## 2022-02-07 MED ORDER — LIDOCAINE 5 % EX PTCH: 2.0000 | MEDICATED_PATCH | CUTANEOUS | 0 refills | Status: DC

## 2022-02-07 MED ORDER — LEVOTHYROXINE SODIUM 50 MCG PO TABS
50.0000 ug | ORAL_TABLET | Freq: Every morning | ORAL | Status: DC
  Administered 2022-02-08 – 2022-02-13 (×6): 50 ug via ORAL
  Filled 2022-02-07 (×6): qty 1

## 2022-02-07 MED ORDER — PROCHLORPERAZINE EDISYLATE 10 MG/2ML IJ SOLN: 5.0000 mg | Freq: Four times a day (QID) | INTRAMUSCULAR | Status: DC | PRN

## 2022-02-07 MED ORDER — SENNA 8.6 MG PO TABS
1.0000 | ORAL_TABLET | Freq: Two times a day (BID) | ORAL | Status: DC
  Administered 2022-02-07 – 2022-02-13 (×12): 8.6 mg via ORAL
  Filled 2022-02-07 (×12): qty 1

## 2022-02-07 NOTE — Plan of Care (Signed)
°  Problem: RH BOWEL ELIMINATION Goal: RH STG MANAGE BOWEL WITH ASSISTANCE Description: STG Manage Bowel with  mod I Assistance. Outcome: Not Progressing; LBM 2/18; patient refused medication claims she had loose stools when she had a laxative

## 2022-02-07 NOTE — Discharge Summary (Signed)
Physician Discharge Summary  [name] ZHG:[id number] DOB: 08/09/1951 DOA: 01/30/2022  PCP: System, Provider Not In  Admit date: 01/30/2022 Discharge date: 02/07/2022  Admitted From: Home Discharge disposition: CIR  Recommendations at discharge:  Per neurosurgery recommendation, currently on pain control with gabapentin, lidocaine patch, TLSO brace for comfort.  Patient to follow-up with neurosurgery as an outpatient in few weeks.  Could have a discussion regarding surgical decompression once he has recovered.  History of Present Illness / Brief narrative:  [name] is a 71 y.o. female with medical history significant of dementia, DM, HTN, HLD, OSA not on CPAP, pernicious anemia. Patient presented to the ED on 01/31/2012 after a fall, striking her head.  Per history, patient was having dizziness, tremors and falls at home.  She was seen by neurosurgery neurosurgeon 8 months ago and was told that she had "a compressed something".    She was admitted to hospitalist service. Patient complained of severe burning type pain and weakness involving both her hands.  Neurosurgery consultation was obtained. MRI of cervical spine showed straightening of the cervical lordosis.  There is broad-based disc bulge with resultant mild to moderate central stenosis, worst at C4-5, C5-6, C6-7.  MRI of the thoracic spine showed acute to subacute superior endplate fracture of T7 without significant loss of height and a compression fracture of L1. See below for details.   Subjective:  Seen and examined this morning.  Pleasant elderly Caucasian female.  Sitting up at the edge of the bed.  Not in distress.  Complains of inconsistent pain control.  Hospital Course:  Moderate cervical stenosis T7 fracture, L1 fracture -Patient presented with dizziness, frequent falls, severe burning type pain and weakness of both her hands. -MRI cervical spine finding as above showing moderate stenosis.  Per  neurosurgery, there is no signal change within the spinal cord.  Acute decompression was not recommended because of her significant comorbidities.   -MRI thoracic spine showed an acute to subacute fracture of superior endplate of T7 and compression fracture of body of L1.  Fracture not unstable at this time. -Per neurosurgery recommendation, currently on pain control with gabapentin, lidocaine patch, TLSO brace for comfort.  -Patient to follow-up with neurosurgery as an outpatient in few weeks.  Could have a discussion regarding surgical decompression once he has recovered. -PT eval was obtained.  CIR recommended.  Bilateral hand pain -Could be related to cervical stenosis itself.  Patient also reported falling on outstretched hand. Imaging showed possible scapholunate ligamentous injury on the left with negative imaging on the right.  The injury however seems chronic in nature.  Previous hospitalist discussed the case with orthopedics.  Recommended as needed wrist splint for comfort and to follow-up with orthopedics as an outpatient if she continues to have symptoms.   History of essential hypertension -Prior to admission, patient was on Bumex 1 mg daily.  It is currently on hold and patient's blood pressure has been running in normal range.  I would not resume Bumex at discharge.  Continue to monitor.     Dyslipidemia -Continue Repatha as an outpateint   Type II diabetes mellitus -Recent A1c was 7.4 -Seems diet controlled.  Continue sliding scale insulin with Accu-Cheks.   Dementia ([place]) -She has apparent cognitive impairment and this was reported by neurologist recently as well -Delirium precautions ordered -Continue Wellbutrin, Restoril   Hypothyroidism (acquired) Continue Synthroid   Goals of care -  Code Status: Full Code   Nutritional status:  Body mass index  is 31.35 kg/m.  Nutrition Problem: Moderate Malnutrition Etiology: chronic illness (dementia) Signs/Symptoms:  moderate fat depletion, moderate muscle depletion Diet:  Diet Order             Diet - low sodium heart healthy           Diet regular Room service appropriate? Yes with Assist; Fluid consistency: Thin  Diet effective now                    Wounds:  -    Discharge Exam:   Vitals:   02/06/22 1306 02/06/22 1922 02/07/22 0427 02/07/22 0900  BP: 126/62 (!) 144/65 137/66 135/62  Pulse: 83 92 88 85  Resp: 17 18 16 18   Temp: 98.2 F (36.8 C) 97.8 F (36.6 C) 97.8 F (36.6 C) 98.2 F (36.8 C)  TempSrc: Oral Oral Oral Oral  SpO2: 99% 100% 97% 98%  Weight:      Height:        Body mass index is 31.35 kg/m.  General exam: Pleasant, elderly Caucasian female.  Sitting up at the edge of the bed.  Not in distress. Skin: No rashes, lesions or ulcers. HEENT: Atraumatic, normocephalic, no obvious bleeding Lungs: Clear to auscultation bilaterally CVS: Regular rate and rhythm, no murmur GI/Abd soft, nontender, nondistended, bowel sound present CNS: Alert, awake, oriented x3 Psychiatry: Mood appropriate Extremities: No pedal edema, no calf tenderness  Follow ups:    Follow-up [facility].   Contact information: [place] [id number] [phone]        Lisette Abu, PA-C Follow up.   Specialty: Orthopedic Surgery Contact information: [place] [address] [phone]                 Discharge Instructions:   Discharge Instructions     Call MD for:  difficulty breathing, headache or visual disturbances   Complete by: As directed    Call MD for:  extreme fatigue   Complete by: As directed    Call MD for:  hives   Complete by: As directed    Call MD for:  persistant dizziness or light-headedness   Complete by: As directed    Call MD for:  persistant nausea and vomiting   Complete by: As directed    Call MD for:  severe uncontrolled  pain   Complete by: As directed    Call MD for:  temperature >100.4   Complete by: As directed    Diet - low sodium heart healthy   Complete by: As directed    Discharge instructions   Complete by: As directed    Recommendations at discharge:   Per neurosurgery recommendation, currently on pain control with gabapentin, lidocaine patch, TLSO brace for comfort.   Patient to follow-up with neurosurgery as an outpatient in few weeks.  Could have a discussion regarding surgical decompression once he has recovered.  General discharge instructions: Follow with Primary MD System, Provider Not In in 7 days  Please request your PCP  to go over your hospital tests, procedures, radiology results at the follow up. Please get your medicines reviewed and adjusted.  Your PCP may decide to repeat certain labs or tests as needed. Do not drive, operate heavy machinery, perform activities at heights, swimming or participation in water activities or provide baby sitting services if your were admitted for syncope or siezures until you  have seen by Primary MD or a Neurologist and advised to do so again. [place] Controlled Substance Reporting System database was reviewed. Do not drive, operate heavy machinery, perform activities at heights, swim, participate in water activities or provide baby-sitting services while on medications for pain, sleep and mood until your outpatient physician has reevaluated you and advised to do so again.  You are strongly recommended to comply with the dose, frequency and duration of prescribed medications. Activity: As tolerated with Full fall precautions use walker/cane & assistance as needed Avoid using any recreational substances like cigarette, tobacco, alcohol, or non-prescribed drug. If you experience worsening of your admission symptoms, develop shortness of breath, life threatening emergency, suicidal or homicidal thoughts you must seek medical attention immediately by  calling 911 or calling your MD immediately  if symptoms less severe. You must read complete instructions/literature along with all the possible adverse reactions/side effects for all the medicines you take and that have been prescribed to you. Take any new medicine only after you have completely understood and accepted all the possible adverse reactions/side effects.  Wear Seat belts while driving. You were cared for by a hospitalist during your hospital stay. If you have any questions about your discharge medications or the care you received while you were in the hospital after you are discharged, you can call the unit and ask to speak with the hospitalist or the covering physician. Once you are discharged, your primary care physician will handle any further medical issues. Please note that NO REFILLS for any discharge medications will be authorized once you are discharged, as it is imperative that you return to your primary care physician (or establish a relationship with a primary care physician if you do not have one).   Discharge wound care:   Complete by: As directed    Increase activity slowly   Complete by: As directed        Discharge Medications:   Allergies as of 02/07/2022       Reactions   Acarbose Other (See Comments)   Atenolol Other (See Comments)   Atorvastatin Other (See Comments)   Canagliflozin Other (See Comments)   Crestor [rosuvastatin] Other (See Comments)   Empagliflozin Other (See Comments)   Glimepiride Other (See Comments)   Losartan Potassium Other (See Comments)   Metformin Other (See Comments)   Other Other (See Comments)   Pioglitazone Other (See Comments)   Pravastatin Other (See Comments)   Spironolactone-hctz Other (See Comments)        Medication List     STOP taking these medications    bumetanide 1 MG tablet Commonly known as: BUMEX   OVER THE COUNTER MEDICATION   Potassium 99 MG Tabs       TAKE these medications    acetaminophen  500 MG tablet Commonly known as: TYLENOL Take 1,000 mg by mouth every 6 (six) hours as needed for mild pain.   aspirin 325 MG EC tablet Take 325 mg by mouth daily.   buPROPion 150 MG 24 hr tablet Commonly known as: WELLBUTRIN XL Take 300 mg by mouth daily.   cholecalciferol 25 MCG (1000 UNIT) tablet Commonly known as: VITAMIN D3 Take 1,000 Units by mouth 2 (two) times a week.   cyanocobalamin 1000 MCG/ML injection Commonly known as: (VITAMIN B-12) Inject 1,000 mcg into the muscle every 30 (thirty) days.   diphenoxylate-atropine 2.5-0.025 MG tablet Commonly known as: LOMOTIL Take 1 tablet by mouth 4 (four) times daily as needed for diarrhea  or loose stools.   estradiol 0.1 MG/GM vaginal cream Commonly known as: ESTRACE Place 1 Applicatorful vaginally 2 (two) times a week.   Evolocumab 140 MG/ML Sosy Inject 140 mg into the skin every 14 (fourteen) days.   feeding supplement (GLUCERNA SHAKE) Liqd Take 237 mLs by mouth 2 (two) times daily between meals.   gabapentin 100 MG capsule Commonly known as: NEURONTIN Take 1 capsule (100 mg total) by mouth 3 (three) times daily.   insulin aspart 100 UNIT/ML injection Commonly known as: novoLOG Inject 0-15 Units into the skin 3 (three) times daily with meals.   insulin aspart 100 UNIT/ML injection Commonly known as: novoLOG Inject 0-5 Units into the skin at bedtime.   levothyroxine 50 MCG tablet Commonly known as: SYNTHROID Take 50 mcg by mouth every morning.   lidocaine 5 % Commonly known as: LIDODERM Place 2 patches onto the skin daily. Remove & Discard patch within 12 hours or as directed by MD   methocarbamol 500 MG tablet Commonly known as: ROBAXIN Take 1 tablet (500 mg total) by mouth every 6 (six) hours as needed for muscle spasms.   multivitamin with minerals Tabs tablet Take 1 tablet by mouth daily. Start taking on: February 08, 2022   oxyCODONE 5 MG immediate release tablet Commonly known as: Oxy  IR/ROXICODONE Take 1 tablet (5 mg total) by mouth every 4 (four) hours as needed for moderate pain.   oxyCODONE-acetaminophen 7.5-325 MG tablet Commonly known as: PERCOCET Take 1 tablet by mouth every 6 (six) hours.   pantoprazole 40 MG tablet Commonly known as: PROTONIX Take 40 mg by mouth daily as needed Jerrye Bushy).   temazepam 15 MG capsule Commonly known as: RESTORIL Take 15 mg by mouth at bedtime as needed for sleep.               Discharge Care Instructions  (From admission, onward)           Start     Ordered   02/07/22 0000  Discharge wound care:        02/07/22 1038             The results of significant diagnostics from this hospitalization (including imaging, microbiology, ancillary and laboratory) are listed below for reference.    Procedures and Diagnostic Studies:   CT Head Wo Contrast  Result Date: 01/30/2022 CLINICAL DATA:  Fall EXAM: CT HEAD WITHOUT CONTRAST CT CERVICAL SPINE WITHOUT CONTRAST TECHNIQUE: Multidetector CT imaging of the head and cervical spine was performed following the standard protocol without intravenous contrast. Multiplanar CT image reconstructions of the cervical spine were also generated. RADIATION DOSE REDUCTION: This exam was performed according to the departmental dose-optimization program which includes automated exposure control, adjustment of the mA and/or kV according to patient size and/or use of iterative reconstruction technique. COMPARISON:  None. FINDINGS: CT HEAD FINDINGS Brain: There is no mass, hemorrhage or extra-axial collection. The size and configuration of the ventricles and extra-axial CSF spaces are normal. Dense mineralization in the basal ganglia and cerebellar hemispheres. Vascular: No abnormal hyperdensity of the major intracranial arteries or dural venous sinuses. No intracranial atherosclerosis. Skull: Small right frontal scalp hematoma.  No skull fracture. Sinuses/Orbits: No fluid levels or advanced  mucosal thickening of the visualized paranasal sinuses. No mastoid or middle ear effusion. The orbits are normal. CT CERVICAL SPINE FINDINGS Alignment: No static subluxation. Facets are aligned. Occipital condyles are normally positioned. Skull base and vertebrae: No acute fracture. Soft tissues and spinal canal: No prevertebral  fluid or swelling. No visible canal hematoma. Disc levels: No advanced spinal canal or neural foraminal stenosis. Upper chest: No pneumothorax, pulmonary nodule or pleural effusion. Other: Normal visualized paraspinal cervical soft tissues. IMPRESSION: 1. No acute intracranial abnormality. 2. Small right frontal scalp hematoma without skull fracture. 3. No acute fracture or static subluxation of the cervical spine. Electronically Signed   By: Ulyses Jarred M.D.   On: 01/30/2022 20:46   CT Cervical Spine Wo Contrast  Result Date: 01/30/2022 CLINICAL DATA:  Fall EXAM: CT HEAD WITHOUT CONTRAST CT CERVICAL SPINE WITHOUT CONTRAST TECHNIQUE: Multidetector CT imaging of the head and cervical spine was performed following the standard protocol without intravenous contrast. Multiplanar CT image reconstructions of the cervical spine were also generated. RADIATION DOSE REDUCTION: This exam was performed according to the departmental dose-optimization program which includes automated exposure control, adjustment of the mA and/or kV according to patient size and/or use of iterative reconstruction technique. COMPARISON:  None. FINDINGS: CT HEAD FINDINGS Brain: There is no mass, hemorrhage or extra-axial collection. The size and configuration of the ventricles and extra-axial CSF spaces are normal. Dense mineralization in the basal ganglia and cerebellar hemispheres. Vascular: No abnormal hyperdensity of the major intracranial arteries or dural venous sinuses. No intracranial atherosclerosis. Skull: Small right frontal scalp hematoma.  No skull fracture. Sinuses/Orbits: No fluid levels or advanced  mucosal thickening of the visualized paranasal sinuses. No mastoid or middle ear effusion. The orbits are normal. CT CERVICAL SPINE FINDINGS Alignment: No static subluxation. Facets are aligned. Occipital condyles are normally positioned. Skull base and vertebrae: No acute fracture. Soft tissues and spinal canal: No prevertebral fluid or swelling. No visible canal hematoma. Disc levels: No advanced spinal canal or neural foraminal stenosis. Upper chest: No pneumothorax, pulmonary nodule or pleural effusion. Other: Normal visualized paraspinal cervical soft tissues. IMPRESSION: 1. No acute intracranial abnormality. 2. Small right frontal scalp hematoma without skull fracture. 3. No acute fracture or static subluxation of the cervical spine. Electronically Signed   By: Ulyses Jarred M.D.   On: 01/30/2022 20:46   CT Thoracic Spine Wo Contrast  Result Date: 01/30/2022 CLINICAL DATA:  Acute thoracic myelopathy.  Fall EXAM: CT THORACIC SPINE WITHOUT CONTRAST TECHNIQUE: Multidetector CT images of the thoracic were obtained using the standard protocol without intravenous contrast. RADIATION DOSE REDUCTION: This exam was performed according to the departmental dose-optimization program which includes automated exposure control, adjustment of the mA and/or kV according to patient size and/or use of iterative reconstruction technique. COMPARISON:  None. FINDINGS: Alignment: Normal. Vertebrae: There is chronic compression deformity of L1. No acute vertebral abnormality. Paraspinal and other soft tissues: Calcific aortic atherosclerosis. Disc levels: There is no spinal canal stenosis. The neural foramina are widely patent. IMPRESSION: 1. No acute fracture or static subluxation of the thoracic spine. 2. Chronic compression deformity of L1. Aortic Atherosclerosis (ICD10-I70.0). Electronically Signed   By: Ulyses Jarred M.D.   On: 01/30/2022 20:36   MR Cervical Spine W or Wo Contrast  Result Date: 01/31/2022 CLINICAL DATA:   Fall EXAM: MRI CERVICAL AND THORACIC SPINE WITHOUT AND WITH CONTRAST TECHNIQUE: Multiplanar and multiecho pulse sequences of the cervical spine, to include the craniocervical junction and cervicothoracic junction, and the thoracic spine, were obtained without and with intravenous contrast. CONTRAST:  58mL GADAVIST GADOBUTROL 1 MMOL/ML IV SOLN COMPARISON:  None. FINDINGS: MRI CERVICAL SPINE FINDINGS Alignment: Grade 1 retrolisthesis at C5-6 and grade 1 anterolisthesis at C6-7 Vertebrae: No fracture, evidence of discitis, or bone lesion. Cord: Normal  signal and morphology. Posterior Fossa, vertebral arteries, paraspinal tissues: Negative. Disc levels: C1-2: Unremarkable. C2-3: Normal disc space and facet joints. There is no spinal canal stenosis. No neural foraminal stenosis. C3-4: Left uncovertebral hypertrophy. There is no spinal canal stenosis. Mild left neural foraminal stenosis. C4-5: Small disc bulge with bilateral uncovertebral hypertrophy. Mild spinal canal stenosis. Mild right and moderate left neural foraminal stenosis. C5-6: Small disc bulge with uncovertebral hypertrophy. Moderate spinal canal stenosis. Severe bilateral neural foraminal stenosis. C6-7: Small disc bulge with uncovertebral hypertrophy. There is no spinal canal stenosis. No neural foraminal stenosis. C7-T1: Normal disc space and facet joints. There is no spinal canal stenosis. No neural foraminal stenosis. MRI THORACIC SPINE FINDINGS Alignment:  Physiologic. Vertebrae: There is mild height loss at T7 with a small amount of bone marrow edema. The other thoracic vertebral bodies are normal. Cord:  Normal signal and morphology. Paraspinal and other soft tissues: Negative. Disc levels: No spinal canal stenosis. IMPRESSION: 1. Mild T7 compression fracture with a small amount of bone marrow edema, compatible with acute to subacute injury. Minimal height loss. 2. Multilevel degenerative disc disease of the cervical spine with moderate C5-6 and mild  C4-5 spinal canal stenosis. 3. Severe bilateral neural foraminal stenosis at C5-6. Electronically Signed   By: Ulyses Jarred M.D.   On: 01/31/2022 02:41   MR THORACIC SPINE W WO CONTRAST  Result Date: 01/31/2022 CLINICAL DATA:  Fall EXAM: MRI CERVICAL AND THORACIC SPINE WITHOUT AND WITH CONTRAST TECHNIQUE: Multiplanar and multiecho pulse sequences of the cervical spine, to include the craniocervical junction and cervicothoracic junction, and the thoracic spine, were obtained without and with intravenous contrast. CONTRAST:  23mL GADAVIST GADOBUTROL 1 MMOL/ML IV SOLN COMPARISON:  None. FINDINGS: MRI CERVICAL SPINE FINDINGS Alignment: Grade 1 retrolisthesis at C5-6 and grade 1 anterolisthesis at C6-7 Vertebrae: No fracture, evidence of discitis, or bone lesion. Cord: Normal signal and morphology. Posterior Fossa, vertebral arteries, paraspinal tissues: Negative. Disc levels: C1-2: Unremarkable. C2-3: Normal disc space and facet joints. There is no spinal canal stenosis. No neural foraminal stenosis. C3-4: Left uncovertebral hypertrophy. There is no spinal canal stenosis. Mild left neural foraminal stenosis. C4-5: Small disc bulge with bilateral uncovertebral hypertrophy. Mild spinal canal stenosis. Mild right and moderate left neural foraminal stenosis. C5-6: Small disc bulge with uncovertebral hypertrophy. Moderate spinal canal stenosis. Severe bilateral neural foraminal stenosis. C6-7: Small disc bulge with uncovertebral hypertrophy. There is no spinal canal stenosis. No neural foraminal stenosis. C7-T1: Normal disc space and facet joints. There is no spinal canal stenosis. No neural foraminal stenosis. MRI THORACIC SPINE FINDINGS Alignment:  Physiologic. Vertebrae: There is mild height loss at T7 with a small amount of bone marrow edema. The other thoracic vertebral bodies are normal. Cord:  Normal signal and morphology. Paraspinal and other soft tissues: Negative. Disc levels: No spinal canal stenosis.  IMPRESSION: 1. Mild T7 compression fracture with a small amount of bone marrow edema, compatible with acute to subacute injury. Minimal height loss. 2. Multilevel degenerative disc disease of the cervical spine with moderate C5-6 and mild C4-5 spinal canal stenosis. 3. Severe bilateral neural foraminal stenosis at C5-6. Electronically Signed   By: Ulyses Jarred M.D.   On: 01/31/2022 02:41   DG Chest Port 1 View  Result Date: 01/31/2022 CLINICAL DATA:  Shortness of breath. EXAM: PORTABLE CHEST 1 VIEW COMPARISON:  None. FINDINGS: Mild cardiomegaly with mild central vascular congestion. No focal consolidation, pleural effusion, or pneumothorax. Atherosclerotic calcification of the aorta. No acute osseous pathology.  IMPRESSION: Mild cardiomegaly with mild central vascular congestion. No focal consolidation. Electronically Signed   By: Anner Crete M.D.   On: 01/31/2022 00:36   DG Hand Complete Left  Result Date: 01/31/2022 CLINICAL DATA:  71 year old female status post fall. EXAM: LEFT HAND - COMPLETE 3+ VIEW COMPARISON:  None. FINDINGS: Distal radius and ulna appear intact. There is abnormal widening of the scapholunate interval. But otherwise carpal bone alignment is maintained. Joint space loss and subchondral sclerosis along the radial carpal row and also at the 1st [facility] joint. Questionable healed 5th metacarpal fracture. Metacarpals appear intact. Diffuse distal IP joint space loss with bulky osteophytosis in keeping with osteoarthritis. No phalanx fracture or dislocation identified. No discrete soft tissue injury. Calcified peripheral vascular disease at the distal forearm and wrist. IMPRESSION: 1. Widening of the Scapholunate interval compatible with acute or chronic ligamentous injury which can predispose to SLAC wrist. 2. No acute fracture or dislocation identified about the left hand. 3. 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 4. Calcified peripheral vascular disease. Electronically  Signed   By: Genevie Ann M.D.   On: 01/31/2022 06:25   DG Hand Complete Right  Result Date: 01/31/2022 CLINICAL DATA:  Status post fall with head and hand injuries. EXAM: RIGHT HAND - COMPLETE 3+ VIEW COMPARISON:  None. FINDINGS: No acute fracture or dislocation identified. There are marked degenerative changes involving the D IP joints. Moderate basilar joint and first MCP joint degenerative change also noted. Soft tissues are unremarkable. IMPRESSION: 1. No acute findings. 2. Advanced osteoarthritis. Electronically Signed   By: Kerby Moors M.D.   On: 01/31/2022 06:24     Labs:   Basic Metabolic Panel: Recent Labs  Lab 02/01/22 0408  NA 134*  K 4.1  CL 101  CO2 23  GLUCOSE 210*  BUN 12  CREATININE 1.01*  CALCIUM 9.1   GFR Estimated Creatinine Clearance: 49.6 mL/min (A) (by C-G formula based on SCr of 1.01 mg/dL (H)). Liver Function Tests: No results for input(s): AST, ALT, ALKPHOS, BILITOT, PROT, ALBUMIN in the last 168 hours. No results for input(s): LIPASE, AMYLASE in the last 168 hours. No results for input(s): AMMONIA in the last 168 hours. Coagulation profile No results for input(s): INR, PROTIME in the last 168 hours.  CBC: Recent Labs  Lab 02/01/22 0408  WBC 7.3  HGB 12.3  HCT 36.2  MCV 89.2  PLT 218   Cardiac Enzymes: No results for input(s): CKTOTAL, CKMB, CKMBINDEX, TROPONINI in the last 168 hours. BNP: Invalid input(s): POCBNP CBG: Recent Labs  Lab 02/06/22 0748 02/06/22 1123 02/06/22 1625 02/06/22 2050 02/07/22 0811  GLUCAP 154* 207* 95 184* 155*   D-Dimer No results for input(s): DDIMER in the last 72 hours. Hgb A1c No results for input(s): HGBA1C in the last 72 hours. Lipid Profile No results for input(s): CHOL, HDL, LDLCALC, TRIG, CHOLHDL, LDLDIRECT in the last 72 hours. Thyroid function studies No results for input(s): TSH, T4TOTAL, T3FREE, THYROIDAB in the last 72 hours.  Invalid input(s): FREET3 Anemia work up No results for  input(s): VITAMINB12, FOLATE, FERRITIN, TIBC, IRON, RETICCTPCT in the last 72 hours. Microbiology Recent Results (from the past 240 hour(s))  Resp Panel by RT-PCR (Flu A&B, Covid) Nasopharyngeal Swab     Status: None   Collection Time: 01/30/22  9:01 PM   Specimen: Nasopharyngeal Swab; Nasopharyngeal(NP) swabs in vial transport medium  Result Value Ref Range Status   SARS Coronavirus 2 by RT PCR NEGATIVE NEGATIVE Final  Comment: (NOTE) SARS-CoV-2 target nucleic acids are NOT DETECTED.  The SARS-CoV-2 RNA is generally detectable in upper respiratory specimens during the acute phase of infection. The lowest concentration of SARS-CoV-2 viral copies this assay can detect is 138 copies/mL. A negative result does not preclude SARS-Cov-2 infection and should not be used as the sole basis for treatment or other patient management decisions. A negative result may occur with  improper specimen collection/handling, submission of specimen other than nasopharyngeal swab, presence of viral mutation(s) within the areas targeted by this assay, and inadequate number of viral copies(<138 copies/mL). A negative result must be combined with clinical observations, patient history, and epidemiological information. The expected result is Negative.  Fact Sheet for Patients:  EntrepreneurPulse.com.au  Fact Sheet for Healthcare Providers:  IncredibleEmployment.be  This test is no t yet approved or cleared by the [place] FDA and  has been authorized for detection and/or diagnosis of SARS-CoV-2 by FDA under an Emergency Use Authorization (EUA). This EUA will remain  in effect (meaning this test can be used) for the duration of the COVID-19 declaration under Section 564(b)(1) of the Act, 21 U.S.C.section 360bbb-3(b)(1), unless the authorization is terminated  or revoked sooner.       Influenza A by PCR NEGATIVE NEGATIVE Final   Influenza B by PCR NEGATIVE  NEGATIVE Final    Comment: (NOTE) The Xpert Xpress SARS-CoV-2/FLU/RSV plus assay is intended as an aid in the diagnosis of influenza from Nasopharyngeal swab specimens and should not be used as a sole basis for treatment. Nasal washings and aspirates are unacceptable for Xpert Xpress SARS-CoV-2/FLU/RSV testing.  Fact Sheet for Patients: EntrepreneurPulse.com.au  Fact Sheet for Healthcare Providers: IncredibleEmployment.be  This test is not yet approved or cleared by the [place] FDA and has been authorized for detection and/or diagnosis of SARS-CoV-2 by FDA under an Emergency Use Authorization (EUA). This EUA will remain in effect (meaning this test can be used) for the duration of the COVID-19 declaration under Section 564(b)(1) of the Act, 21 U.S.C. section 360bbb-3(b)(1), unless the authorization is terminated or revoked.  Performed at [facility], [address], [place], [place] [address]     Time coordinating discharge: 35 minutes  Signed: Terrilee Croak  [facility] 02/07/2022, 10:38 AM

## 2022-02-07 NOTE — Progress Notes (Signed)
Inpatient Rehabilitation Admission Medication Review by a Pharmacist  A complete drug regimen review was completed for this patient to identify any potential clinically significant medication issues.  High Risk Drug Classes Is patient taking? Indication by Medication  Antipsychotic Yes Compazine- N/V  Anticoagulant Yes Lovenox- VTE prophylaxis  Antibiotic No   Opioid Yes OxyIR- acute pain needs  Antiplatelet Yes Aspirin- CVA prophylaxis  Hypoglycemics/insulin Yes iSS- T2DM  Vasoactive Medication No   Chemotherapy No   Other Yes Trazodone- sleep Wellbutrin- MDD Synthroid- hypothyroidism Lidoderm patch- local pain     Type of Medication Issue Identified Description of Issue Recommendation(s)  Drug Interaction(s) (clinically significant)     Duplicate Therapy     Allergy     No Medication Administration End Date     Incorrect Dose     Additional Drug Therapy Needed     Significant med changes from prior encounter (inform family/care partners about these prior to discharge).    Other  PTA meds: Bumex D-3 B-12 Estrace Evolumab Protonix restoril Retart home meds when and if clinically necessary during CIR admission or at time of discharge.     Clinically significant medication issues were identified that warrant physician communication and completion of prescribed/recommended actions by midnight of the next day:  No   Time spent performing this drug regimen review (minutes):  30   Omauri Boeve BS, PharmD, BCPS Clinical Pharmacist 02/07/2022 2:47 PM

## 2022-02-07 NOTE — Progress Notes (Signed)
Report called to RN on CIR.

## 2022-02-07 NOTE — H&P (Incomplete)
Physical Medicine and Rehabilitation Admission H&P    CC: Functional deficits secondary to recent fall resulting in T7 compression fracture, left hand soft tissue injury  HPI: [name] is a 71 year old female with a history of chronic dizziness and tremors who fell at home, unwitnessed.  She presented to [facility] on 01/30/2022 complaining of diffuse pain particularly of bilateral upper extremities.  She sustained a right frontal laceration with hematoma.  CT scan of head and C-spine were negative.  She complained of significant bilateral upper extremity pain and neurosurgery was consulted. Dr. Kathyrn Sheriff recommended transfer to [facility] for MRI.  This was significant for T7 compression fracture and TLSO was fitted.  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  Possible scapholunate ligamentous injury of the left hand, negative imaging of right hand.  She has remained hemodynamically stable. The patient requires inpatient medicine and rehabilitation evaluations and services for ongoing dysfunction secondary to fall resulting in T7 compression fracture, soft tissue injury to left hand.  History of chronic diarrhea, s/p colonoscopy with pre-cancerous polyp. She is very hesitant about using any laxative and needs to be mild formula.  History of chronic bilateral hand tremors/weakness and falls and Fahr's syndrome has been considered. She has referral to [facility] Neuro movement disorder specialist. Chronic dizziness whick improved once this was weaned off. She was on it for migraines in the distant past.  Not on DM medications due to multiple allergies. Evaluated for possible stroke in 2021 and had negative work-up except asymptomatic carotid disease.  Lives at times with her daughter and granddaughter. Daughter Rosalee Kaufman has schizo-affective disorder. Strained relationships with both sons due to their alcoholism.  Review of Systems   Constitutional:  Positive for chills. Negative for fever.       Chronic chills seem to be associated with medication side effects  HENT:  Positive for hearing loss. Negative for sinus pain and sore throat.   Eyes:  Negative for blurred vision and double vision.  Respiratory:  Negative for cough, sputum production and shortness of breath.   Cardiovascular:  Negative for chest pain and palpitations.  Gastrointestinal:  Positive for constipation, heartburn and nausea. Negative for vomiting.  Musculoskeletal:  Positive for back pain and neck pain.       Pain is left-sided. TLSO brace is uncomfortable and she is supposed to wear when ambulating.   Past Medical History:  Diagnosis Date   Dementia ([place])    Diabetes mellitus without complication ([place])    Dyslipidemia    Endometrial cancer ([place]) 2021   Hypertension    Hypothyroidism (acquired)    OSA (obstructive sleep apnea)    not on CPAP   Pernicious anemia    History reviewed. No pertinent surgical history. History reviewed. No pertinent family history. Social History:  reports that she has never smoked. She has never used smokeless tobacco. She reports that she does not drink alcohol and does not use drugs. Allergies:  Allergies  Allergen Reactions   Acarbose Other (See Comments)   Atenolol Other (See Comments)   Atorvastatin Other (See Comments)   Canagliflozin Other (See Comments)   Crestor [Rosuvastatin] Other (See Comments)   Empagliflozin Other (See Comments)   Glimepiride Other (See Comments)   Losartan Potassium Other (See Comments)   Metformin Other (See Comments)   Other Other (See Comments)   Pioglitazone Other (See Comments)   Pravastatin Other (See Comments)   Spironolactone-Hctz Other (See  Comments)   Medications Prior to Admission  Medication Sig Dispense Refill   acetaminophen (TYLENOL) 500 MG tablet Take 1,000 mg by mouth every 6 (six) hours as needed for mild pain.     aspirin 325 MG EC tablet Take 325 mg by  mouth daily.     bumetanide (BUMEX) 1 MG tablet Take 1 mg by mouth every morning.     buPROPion (WELLBUTRIN XL) 150 MG 24 hr tablet Take 300 mg by mouth daily.     cholecalciferol (VITAMIN D3) 25 MCG (1000 UNIT) tablet Take 1,000 Units by mouth 2 (two) times a week.     cyanocobalamin (,VITAMIN B-12,) 1000 MCG/ML injection Inject 1,000 mcg into the muscle every 30 (thirty) days.     diphenoxylate-atropine (LOMOTIL) 2.5-0.025 MG tablet Take 1 tablet by mouth 4 (four) times daily as needed for diarrhea or loose stools.     estradiol (ESTRACE) 0.1 MG/GM vaginal cream Place 1 Applicatorful vaginally 2 (two) times a week.     Evolocumab 140 MG/ML SOSY Inject 140 mg into the skin every 14 (fourteen) days.     levothyroxine (SYNTHROID) 50 MCG tablet Take 50 mcg by mouth every morning.     OVER THE COUNTER MEDICATION Take 1 capsule by mouth daily. Pure Health Ageless Brain  Dietary Supplement     pantoprazole (PROTONIX) 40 MG tablet Take 40 mg by mouth daily as needed Jerrye Bushy).     Potassium 99 MG TABS Take 99 mg by mouth daily.     temazepam (RESTORIL) 15 MG capsule Take 15 mg by mouth at bedtime as needed for sleep.        Home: Home Living Family/patient expects to be discharged to:: Private residence Living Arrangements: Alone, Children Available Help at Discharge: Family, Available PRN/intermittently Type of Home: House Home Access: Stairs to enter Technical brewer of Steps: 2 [place]: One level Bathroom Shower/Tub: Government social research officer Accessibility: Yes Additional Comments: pt is struggling with some history details, distracted by her neck and back pain, as well as post head pain  Lives With: Alone   Functional History: Prior Function Prior Level of Function : Independent/Modified Independent, Driving Mobility Comments: walked with no AD but has had some falls ADLs Comments: Pt reports she performs ADLs and IADLs including driving and grocery  shopping  Functional Status:  Mobility: Bed Mobility Overal bed mobility: Needs Assistance Bed Mobility: Rolling, Sidelying to Sit Rolling: Min guard Sidelying to sit: Min assist Sit to sidelying: Mod assist General bed mobility comments: up in chair upon arrival Transfers Overall transfer level: Needs assistance Equipment used: Rolling walker (2 wheels) Transfers: Sit to/from Stand Sit to Stand: Min guard Bed to/from chair/wheelchair/BSC transfer type:: Step pivot Step pivot transfers: Min assist General transfer comment: cues for hand placement on RW during transfers/ambulation Ambulation/Gait Ambulation/Gait assistance: Min assist Gait Distance (Feet): 45 Feet Assistive device: Rolling walker (2 wheels), 1 person hand held assist Gait Pattern/deviations: Step-through pattern, Step-to pattern, Decreased stride length, Wide base of support General Gait Details: worked on direction cues, slow to respond to them and to change directions, to stop, to use hand placement to sit safely Gait velocity: reduced Gait velocity interpretation: <1.31 ft/sec, indicative of household ambulator Pre-gait activities: standing balance cues    ADL: ADL Overall ADL's : Needs assistance/impaired Eating/Feeding: Set up, Bed level Grooming: Wash/dry hands, Wash/dry face, Standing Grooming Details (indicate cue type and reason): completed standing at sink Upper Body Bathing: Moderate assistance, Sitting Lower Body Bathing:  Moderate assistance, Sit to/from stand Upper Body Dressing : Moderate assistance, Sitting Lower Body Dressing: Supervision/safety, Sitting/lateral leans Lower Body Dressing Details (indicate cue type and reason): dons socks using figure 4 technique Toilet Transfer: Min guard, Rolling walker (2 wheels), Ambulation, Regular Toilet Toileting- Clothing Manipulation and Hygiene: Min guard, Sit to/from stand Toileting - Clothing Manipulation Details (indicate cue type and reason):  completes pericare and clothing mgmt Functional mobility during ADLs: Min guard, Rolling walker (2 wheels)  Cognition: Cognition Overall Cognitive Status: No family/caregiver present to determine baseline cognitive functioning Arousal/Alertness: Awake/alert Orientation Level: Oriented X4, Oriented to person, Oriented to place, Oriented to time, Oriented to situation Year:  ("19") Month: February Day of Week: Incorrect Attention: Sustained Sustained Attention: Impaired Sustained Attention Impairment: Verbal basic Memory: Impaired Memory Impairment: Storage deficit, Retrieval deficit Awareness: Impaired Awareness Impairment: Anticipatory impairment Problem Solving: Impaired Problem Solving Impairment: Verbal basic Safety/Judgment: Impaired Cognition Arousal/Alertness: Awake/alert Behavior During Therapy: WFL for tasks assessed/performed, Anxious Overall Cognitive Status: No family/caregiver present to determine baseline cognitive functioning General Comments: requires somewhat repetitive cues for mobility particularly directions, but is aware of her braces and the expectation of wearing them  Physical Exam: Blood pressure 135/62, pulse 85, temperature 98.2 F (36.8 C), temperature source Oral, resp. rate 18, height 5' 2.5" (1.588 m), weight 79 kg, SpO2 98 %. Physical Exam Constitutional:      General: She is not in acute distress. HENT:     Head: Normocephalic.  Cardiovascular:     Rate and Rhythm: Normal rate and regular rhythm.  Pulmonary:     Effort: Pulmonary effort is normal.     Breath sounds: Normal breath sounds.  Abdominal:     General: Bowel sounds are normal.     Palpations: Abdomen is soft.  Musculoskeletal:        General: No swelling or deformity.  Skin:    Comments: Right forehead/peri-orbital ecchymosis.3 cm hematoma. 1 cm laceration appears to be closed with Monocryl or vicryl (absorbable) suture.  Neurological:     General: No focal deficit present.      Mental Status: She is alert and oriented to person, place, and time.     Comments: Mild tremors of both hands, worse with intention    Results for orders placed or performed during the hospital encounter of 01/30/22 (from the past 48 hour(s))  Glucose, capillary     Status: Abnormal   Collection Time: 02/05/22 11:43 AM  Result Value Ref Range   Glucose-Capillary 144 (H) 70 - 99 mg/dL    Comment: Glucose reference range applies only to samples taken after fasting for at least 8 hours.  Glucose, capillary     Status: Abnormal   Collection Time: 02/05/22  4:15 PM  Result Value Ref Range   Glucose-Capillary 200 (H) 70 - 99 mg/dL    Comment: Glucose reference range applies only to samples taken after fasting for at least 8 hours.  Glucose, capillary     Status: Abnormal   Collection Time: 02/05/22  8:56 PM  Result Value Ref Range   Glucose-Capillary 157 (H) 70 - 99 mg/dL    Comment: Glucose reference range applies only to samples taken after fasting for at least 8 hours.  Glucose, capillary     Status: Abnormal   Collection Time: 02/06/22  7:48 AM  Result Value Ref Range   Glucose-Capillary 154 (H) 70 - 99 mg/dL    Comment: Glucose reference range applies only to samples taken after fasting for at least  8 hours.  Glucose, capillary     Status: Abnormal   Collection Time: 02/06/22 11:23 AM  Result Value Ref Range   Glucose-Capillary 207 (H) 70 - 99 mg/dL    Comment: Glucose reference range applies only to samples taken after fasting for at least 8 hours.  Glucose, capillary     Status: None   Collection Time: 02/06/22  4:25 PM  Result Value Ref Range   Glucose-Capillary 95 70 - 99 mg/dL    Comment: Glucose reference range applies only to samples taken after fasting for at least 8 hours.  Glucose, capillary     Status: Abnormal   Collection Time: 02/06/22  8:50 PM  Result Value Ref Range   Glucose-Capillary 184 (H) 70 - 99 mg/dL    Comment: Glucose reference range applies only to  samples taken after fasting for at least 8 hours.  Glucose, capillary     Status: Abnormal   Collection Time: 02/07/22  8:11 AM  Result Value Ref Range   Glucose-Capillary 155 (H) 70 - 99 mg/dL    Comment: Glucose reference range applies only to samples taken after fasting for at least 8 hours.   No results found.    Blood pressure 135/62, pulse 85, temperature 98.2 F (36.8 C), temperature source Oral, resp. rate 18, height 5' 2.5" (1.588 m), weight 79 kg, SpO2 98 %.  Medical Problem List and Plan: 1. Functional deficits secondary to T7 compression fracture status post fall  -patient may *** shower  -ELOS/Goals: *** 2.  Antithrombotics: -DVT/anticoagulation:  Pharmaceutical: Lovenox  -antiplatelet therapy: Aspirin 325 mg daily 3. Pain Management: Neurontin 100 mg 3 times daily, Robaxin, oxycodone as needed, lidocaine patch as needed 4. Mood: LCSW to evaluate and provide emotional support  --Continue Wellbutrin 300 mg daily (sees therapist for possible depression)  -antipsychotic agents: Not applicable 5. Neuropsych: This patient is capable of making decisions on her own behalf. 6. Skin/Wound Care: Routine skin care checks  -- forehead laceration, hematoma: Monitor. Sutures are absorable 7. Fluids/Electrolytes/Nutrition: Routine I's and O's and follow-up chemistries 8.  Diabetes mellitus: CBGs and sliding scale insulin, carb modified diet  -- Does not appear she was on home medications 9.  Hypertension: On Bumex at home, currently held 10: Hyperlipidemia: On Repatha at home q 14 days 11: Insomnia: melatonin not effective 12: Pernicious anemia: Normal H&H and normal red blood cell indices --B 12 injection monthly 13: Hypothyroidism: Continue Synthroid 50 mcg every morning 14: Moderate malnutrition secondary to chronic illness: Follow-up albumin, total protein 15: Chronic upper extremity tremor:  16: T7 fracture: TSLO brace 17: Left hand soft tissue injury: splint as needed 18:  GERD: Continue Protonix  ***  Barbie Banner, PA-C 02/07/2022

## 2022-02-07 NOTE — H&P (Signed)
Physical Medicine and Rehabilitation Admission H&P    CC: Functional deficits secondary to recent fall resulting in T7 compression fracture, left hand soft tissue injury  HPI: [name] is a 71 year old female with a history of chronic dizziness and tremors who fell at home, unwitnessed.  She presented to [facility] on 01/30/2022 complaining of diffuse pain particularly of bilateral upper extremities.  She sustained a right frontal laceration with hematoma.  CT scan of head and C-spine were negative.  She complained of significant bilateral upper extremity pain and neurosurgery was consulted. Dr. Kathyrn Sheriff recommended transfer to [facility] for MRI.  This was significant for T7 compression fracture and TLSO was fitted.  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  Possible scapholunate ligamentous injury of the left hand, negative imaging of right hand.  She has remained hemodynamically stable. The patient requires inpatient medicine and rehabilitation evaluations and services for ongoing dysfunction secondary to fall resulting in T7 compression fracture, soft tissue injury to left hand.  History of chronic diarrhea, s/p colonoscopy with pre-cancerous polyp. She is very hesitant about using any laxative and needs to be mild formula.  History of chronic bilateral hand tremors/weakness and falls and Fahr's syndrome has been considered. She has referral to [facility] movement disorder specialist. Chronic dizziness whick improved once this was weaned off. She was on it for migraines in the distant past.  Not on DM medications due to multiple allergies. Evaluated for possible stroke in 2021 and had negative work-up except asymptomatic carotid disease.  Lives at times with her daughter and granddaughter. Daughter Rosalee Kaufman has schizo-affective disorder. Strained relationships with both sons due to their alcoholism.  Currently complains of  low back pain  Review of Systems  Constitutional:  Positive for chills. Negative for fever.       Chronic chills seem to be associated with medication side effects  HENT:  Positive for hearing loss. Negative for sinus pain and sore throat.   Eyes:  Negative for blurred vision and double vision.  Respiratory:  Negative for cough, sputum production and shortness of breath.   Cardiovascular:  Negative for chest pain and palpitations.  Gastrointestinal:  Positive for constipation, heartburn and nausea. Negative for vomiting.  Musculoskeletal:  Positive for back pain and neck pain.       Pain is left-sided. TLSO brace is uncomfortable and she is supposed to wear when ambulating.   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 name of MD    Dementia ([place])    Diabetes mellitus    type 2    Diabetes mellitus without complication ([place])    Dyslipidemia    Dyspnea    with exertion    Dysrhythmia    "skips beats"   Endometrial cancer ([place]) 2021   Family history of adverse reaction to anesthesia    son stopped breathing during surgery , mother slow to wake up    Fibromyalgia 1980's   Generalized anxiety disorder    GERD (gastroesophageal reflux disease)  occ- will use Tums and Prolosec  prn    Heart murmur    Hyperlipemia    Hypertension    not on blood pressure meds due to allergies - last dose of meds- months ago    Hypertension    Hypothyroidism    Hypothyroidism (acquired)    Irritable bowel syndrome 1980's   Major depressive disorder    Migraine headaches 1980's   On meds-well controlled   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   Morbid obesity ([place])    Pernicious anemia     Pneumonia 2009   Several times over the past several years   PONV (postoperative nausea and vomiting)    was told by neurologist due to calcificaton in brain not to be put to sleep, N/V during Op and Recovery    Recurrent upper respiratory infection (URI)    Sleep apnea    no cpap   Subarachnoid hemorrhage ([place]) 2010   Vaginal polyp    benign per pt   Past Surgical History:  Procedure Laterality Date   CHOLECYSTECTOMY  1983   COLONOSCOPY  2008   DB   DILATION AND CURETTAGE OF UTERUS  1972   HYSTEROSCOPY WITH D & C  06/29/2011   Procedure: DILATATION AND CURETTAGE (D&C) /HYSTEROSCOPY;  Surgeon: Donnamae Jude, MD;  Location: [facility];  Service: Gynecology;  Laterality: N/A;   ROBOTIC ASSISTED TOTAL HYSTERECTOMY WITH BILATERAL SALPINGO OOPHERECTOMY N/A 10/27/2020   Procedure: XI ROBOTIC ASSISTED TOTAL HYSTERECTOMY WITH BILATERAL SALPINGO OOPHORECTOMY;  Surgeon: Everitt Amber, MD;  Location: [facility];  Service: Gynecology;  Laterality: N/A;   SENTINEL NODE BIOPSY N/A 10/27/2020   Procedure: SENTINEL NODE BIOPSY;  Surgeon: Everitt Amber, MD;  Location: [facility];  Service: Gynecology;  Laterality: N/A;   Family History  Problem Relation Age of Onset   Liver cancer Father        mets to liver    Bladder Cancer Father    Colon cancer Father        mets to colon    Anesthesia problems Mother        hard to wake post op    Cerebrovascular Disease Mother        Never had stroke, but had CEA.   High blood pressure Other    Diabetes Other    Arthritis Other    Anesthesia problems Son        stopped breathing post op    Tremor Son    Schizophrenia Daughter        Schizoaffective disorder   Anesthesia problems Granddaughter        PONV   Dementia Neg Hx    Esophageal cancer Neg Hx    Rectal cancer Neg Hx    Stomach cancer Neg Hx    Colon polyps Neg Hx    Social History:  reports that she has never smoked. She has never used smokeless tobacco. She reports that she does not drink alcohol and  does not use drugs. Allergies:  Allergies  Allergen Reactions   Atenolol     Stomach problems, chills   Atorvastatin     Other reaction(s): retain fld   Canagliflozin     ALL DIABETIC MEDS PER PT   abdominal pain, headache   Losartan Potassium     chills, HA, stomach pain   Acarbose Nausea Only     headache,nausea   Cinnamon     Stomach pain   Empagliflozin      headaches  Glimepiride     ALL DIABETIC MEDS PER PT  abdominal pain, headache   Metformin And Related Nausea And Vomiting and Other (See Comments)    Patient states that she has severe chills, headache and cramping additionally. Says blood sugar is uncontrolled because of this   Other Other (See Comments)    ALL DIABETIC MEDS PER PT    Pioglitazone Other (See Comments)    ALL DIABETIC MEDS PER PT  Other reaction(s): peripheral edema   Pravastatin     Other reaction(s): retain fld   Sitagliptin     ALL DIABETIC MEDS PER PT  Other reaction(s): headache   Spironolactone-Hctz Other (See Comments)   Statins     Myalgia   Sulfamethoxazole Other (See Comments)    ALL DIABETIC MEDS PER PT    Tramadol Hcl Itching   Acarbose Other (See Comments)   Atenolol Other (See Comments)   Atorvastatin Other (See Comments)   Canagliflozin Other (See Comments)   Crestor [Rosuvastatin] Other (See Comments)   Empagliflozin Other (See Comments)   Glimepiride Other (See Comments)   Losartan Potassium Other (See Comments)   Metformin Other (See Comments)   Other Other (See Comments)   Pioglitazone Other (See Comments)   Pravastatin Other (See Comments)   Spironolactone-Hctz Other (See Comments)   Sulfa Antibiotics Rash and Other (See Comments)    Mother has told patient in the past not to take, she cannot recall there reaction   Medications Prior to Admission  Medication Sig Dispense Refill   acetaminophen (TYLENOL) 500 MG tablet Take 500 mg by mouth every 6 (six) hours as needed for moderate pain.     acetaminophen (TYLENOL)  500 MG tablet Take 1,000 mg by mouth every 6 (six) hours as needed for mild pain.     aspirin 325 MG EC tablet Take 325 mg by mouth daily.     aspirin EC 325 MG tablet Take 325 mg by mouth daily.     bumetanide (BUMEX) 1 MG tablet Take 1 tablet (1 mg total) by mouth every morning. 90 tablet 1   buPROPion (WELLBUTRIN XL) 150 MG 24 hr tablet Take 1 tablet by mouth in the morning, at noon, and at bedtime. Patient taking 2 tabs once a day     buPROPion (WELLBUTRIN XL) 150 MG 24 hr tablet Take 300 mg by mouth daily.     cholecalciferol (VITAMIN D3) 25 MCG (1000 UNIT) tablet Take 1,000 Units by mouth 2 (two) times a week.     cyanocobalamin (,VITAMIN B-12,) 1000 MCG/ML injection Inject 1,000 mcg into the muscle every 30 (thirty) days.      cyanocobalamin (,VITAMIN B-12,) 1000 MCG/ML injection Inject 1,000 mcg into the muscle every 30 (thirty) days.     diphenoxylate-atropine (LOMOTIL) 2.5-0.025 MG tablet TAKE 1 TABLET BY MOUTH TWICE DAILY AS NEEDED FOR  DIARRHEA  OR  LOOSE  STOOLS 60 tablet 0   diphenoxylate-atropine (LOMOTIL) 2.5-0.025 MG tablet Take 1 tablet by mouth 4 (four) times daily as needed for diarrhea or loose stools.     estradiol (ESTRACE) 0.1 MG/GM vaginal cream Place 0.5 g vaginally 2 (two) times a week. Place 0.5g nightly for two weeks then twice a week after 30 g 11   estradiol (ESTRACE) 0.1 MG/GM vaginal cream Place 1 Applicatorful vaginally 2 (two) times a week.     Evolocumab (REPATHA SURECLICK) 092 MG/ML SOAJ INJECT 1 PEN SUBCUTANEOUSLY EVERY 14 DAYS 2 mL 11   Evolocumab 140 MG/ML SOSY Inject 140  mg into the skin every 14 (fourteen) days.     feeding supplement, GLUCERNA SHAKE, (GLUCERNA SHAKE) LIQD Take 237 mLs by mouth 2 (two) times daily between meals.  0   gabapentin (NEURONTIN) 100 MG capsule Take 1 capsule (100 mg total) by mouth 3 (three) times daily.     insulin aspart (NOVOLOG) 100 UNIT/ML injection Inject 0-15 Units into the skin 3 (three) times daily with meals. 10 mL 11    insulin aspart (NOVOLOG) 100 UNIT/ML injection Inject 0-5 Units into the skin at bedtime. 10 mL 11   levothyroxine (SYNTHROID) 50 MCG tablet Take 1 tablet (50 mcg total) by mouth daily. 90 tablet 1   levothyroxine (SYNTHROID) 50 MCG tablet Take 50 mcg by mouth every morning.     lidocaine (LIDODERM) 5 % Place 2 patches onto the skin daily. Remove & Discard patch within 12 hours or as directed by MD 30 patch 0   MAGNESIUM PO Take by mouth.     methocarbamol (ROBAXIN) 500 MG tablet Take 1 tablet (500 mg total) by mouth every 6 (six) hours as needed for muscle spasms.     [START ON 02/08/2022] Multiple Vitamin (MULTIVITAMIN WITH MINERALS) TABS tablet Take 1 tablet by mouth daily.     oxyCODONE (OXY IR/ROXICODONE) 5 MG immediate release tablet Take 1 tablet (5 mg total) by mouth every 4 (four) hours as needed for moderate pain. 30 tablet 0   oxyCODONE-acetaminophen (PERCOCET) 7.5-325 MG tablet Take 1 tablet by mouth every 6 (six) hours. 30 tablet 0   pantoprazole (PROTONIX) 40 MG tablet Take 1 tablet (40 mg total) by mouth daily. 30 tablet 11   pantoprazole (PROTONIX) 40 MG tablet Take 40 mg by mouth daily as needed Jerrye Bushy).     Potassium 99 MG TABS Take 198 mg by mouth daily.     temazepam (RESTORIL) 15 MG capsule Take 15 mg by mouth at bedtime as needed for sleep.     Home: Home Living Family/patient expects to be discharged to:: Private residence Living Arrangements: Alone, Children Available Help at Discharge: Family, Available PRN/intermittently Type of Home: House Home Access: Stairs to enter Technical brewer of Steps: 2 [place]: One level Bathroom Shower/Tub: Government social research officer Accessibility: Yes Additional Comments: pt is struggling with some history details, distracted by her neck and back pain, as well as post head pain  Lives With: Alone   Functional History: Prior Function Prior Level of Function : Independent/Modified Independent,  Driving Mobility Comments: walked with no AD but has had some falls ADLs Comments: Pt reports she performs ADLs and IADLs including driving and grocery shopping   Functional Status:  Mobility: Bed Mobility Overal bed mobility: Needs Assistance Bed Mobility: Rolling, Sidelying to Sit Rolling: Min guard Sidelying to sit: Min assist Sit to sidelying: Mod assist General bed mobility comments: up in chair upon arrival Transfers Overall transfer level: Needs assistance Equipment used: Rolling walker (2 wheels) Transfers: Sit to/from Stand Sit to Stand: Min guard Bed to/from chair/wheelchair/BSC transfer type:: Step pivot Step pivot transfers: Min assist General transfer comment: cues for hand placement on RW during transfers/ambulation Ambulation/Gait Ambulation/Gait assistance: Min assist Gait Distance (Feet): 45 Feet Assistive device: Rolling walker (2 wheels), 1 person hand held assist Gait Pattern/deviations: Step-through pattern, Step-to pattern, Decreased stride length, Wide base of support General Gait Details: worked on direction cues, slow to respond to them and to change directions, to stop, to use hand placement to sit safely Gait velocity: reduced Gait  velocity interpretation: <1.31 ft/sec, indicative of household ambulator Pre-gait activities: standing balance cues   ADL: ADL Overall ADL's : Needs assistance/impaired Eating/Feeding: Set up, Bed level Grooming: Wash/dry hands, Wash/dry face, Standing Grooming Details (indicate cue type and reason): completed standing at sink Upper Body Bathing: Moderate assistance, Sitting Lower Body Bathing: Moderate assistance, Sit to/from stand Upper Body Dressing : Moderate assistance, Sitting Lower Body Dressing: Supervision/safety, Sitting/lateral leans Lower Body Dressing Details (indicate cue type and reason): dons socks using figure 4 technique Toilet Transfer: Min guard, Rolling walker (2 wheels), Ambulation, Regular  Toilet Toileting- Clothing Manipulation and Hygiene: Min guard, Sit to/from stand Toileting - Clothing Manipulation Details (indicate cue type and reason): completes pericare and clothing mgmt Functional mobility during ADLs: Min guard, Rolling walker (2 wheels)   Cognition: Cognition Overall Cognitive Status: No family/caregiver present to determine baseline cognitive functioning Arousal/Alertness: Awake/alert Orientation Level: Oriented X4, Oriented to person, Oriented to place, Oriented to time, Oriented to situation Year:  ("19") Month: February Day of Week: Incorrect Attention: Sustained Sustained Attention: Impaired Sustained Attention Impairment: Verbal basic Memory: Impaired Memory Impairment: Storage deficit, Retrieval deficit Awareness: Impaired Awareness Impairment: Anticipatory impairment Problem Solving: Impaired Problem Solving Impairment: Verbal basic Safety/Judgment: Impaired Cognition Arousal/Alertness: Awake/alert Behavior During Therapy: WFL for tasks assessed/performed, Anxious Overall Cognitive Status: No family/caregiver present to determine baseline cognitive functioning General Comments: requires somewhat repetitive cues for mobility particularly directions, but is aware of her braces and the expectation of wearing them     Physical Exam: Blood pressure (!) 155/75, pulse 91, temperature 98.2 F (36.8 C), resp. rate 18, SpO2 100 %. Gen: no distress, normal appearing HEENT: oral mucosa pink and moist, NCAT Cardio: Reg rate Chest: normal effort, normal rate of breathing Abd: soft, non-distended Ext: no edema Psych: pleasant, normal affect Skin:    Comments: Right forehead/peri-orbital ecchymosis.3 cm hematoma. 1 cm laceration appears to be closed with Monocryl or vicryl (absorbable) suture.  Neurological:     General: No focal deficit present.     Mental Status: She is alert and oriented to person, place, and time.     Comments: Mild tremors of both  hands, worse with intention    Results for orders placed or performed during the hospital encounter of 02/07/22 (from the past 48 hour(s))  Creatinine, serum     Status: Abnormal   Collection Time: 02/07/22  3:21 PM  Result Value Ref Range   Creatinine, Ser 1.11 (H) 0.44 - 1.00 mg/dL   GFR, Estimated 53 (L) >60 mL/min    Comment: (NOTE) Calculated using the CKD-EPI Creatinine Equation (2021) Performed at [facility], [place] [address] [place], [place] [address]   Glucose, capillary     Status: Abnormal   Collection Time: 02/07/22  4:38 PM  Result Value Ref Range   Glucose-Capillary 159 (H) 70 - 99 mg/dL    Comment: Glucose reference range applies only to samples taken after fasting for at least 8 hours.   No results found.    Blood pressure (!) 155/75, pulse 91, temperature 98.2 F (36.8 C), resp. rate 18, SpO2 100 %.  Medical Problem List and Plan: 1. Functional deficits secondary to T7 compression fracture status post fall  -patient may shower  -ELOS/Goals: 7-10 days S  -Admit to CIR 2.  Antithrombotics: -DVT/anticoagulation:  Pharmaceutical: Lovenox  -antiplatelet therapy: Aspirin 325 mg daily 3. Pain Management: Neurontin 100 mg 3 times daily, Robaxin, oxycodone as needed, lidocaine patch as needed 4. Mood: LCSW to evaluate and  provide emotional support  --Continue Wellbutrin 300 mg daily (sees therapist for possible depression)  -antipsychotic agents: Not applicable 5. Neuropsych: This patient is capable of making decisions on her own behalf. 6. Skin/Wound Care: Routine skin care checks  -- forehead laceration, hematoma: Monitor. Sutures are absorable 7. Fluids/Electrolytes/Nutrition: Routine I's and O's and follow-up chemistries 8.  Diabetes mellitus: CBGs and sliding scale insulin, carb modified diet  -- Does not appear she was on home medications 9.  Hypertension: On Bumex at home, currently held 10: Hyperlipidemia: On Repatha at home q 14 days 11: Insomnia:  melatonin not effective 12: Pernicious anemia: Normal H&H and normal red blood cell indices. Continue B 12 injection monthly 13: Hypothyroidism: Continue Synthroid 50 mcg every morning 14: Moderate malnutrition secondary to chronic illness: Follow-up albumin, total protein in tomorrow morning's labs 15: Chronic upper extremity tremor 16: T7 fracture: wear TSLO brace 17: Left hand soft tissue injury: continue to splint as needed 18: GERD: Continue Protonix  I have personally performed a face to face diagnostic evaluation, including, but not limited to relevant history and physical exam findings, of this patient and developed relevant assessment and plan.  Additionally, I have reviewed and concur with the physician assistant's documentation above.  Risa Grill, PA  Izora Ribas, MD 02/07/2022

## 2022-02-07 NOTE — Progress Notes (Signed)
INPATIENT REHABILITATION ADMISSION NOTE   Arrival Method:     Mental Orientation:   Assessment:done   Skin:ecchymosis noted   IV'S:rt f/a SL   Pain:none   Tubes and Drains:none   Safety Measures:discussed   Vital Signs:done   Height and Weight:done   Rehab Orientation:done   Family:not present   Jahyra Sukup RNC,BSN, WTA

## 2022-02-07 NOTE — H&P (Incomplete)
Physical Medicine and Rehabilitation Admission H&P    CC: Functional deficits secondary to recent fall resulting in T7 compression fracture, left hand soft tissue injury  HPI: [name] is a 71 year old female with a history of chronic dizziness and tremors who fell at home, unwitnessed.  She presented to [facility] on 01/30/2022 complaining of diffuse pain particularly of bilateral upper extremities.  She sustained a right frontal laceration with hematoma.  CT scan of head and C-spine were negative.  She complained of significant bilateral upper extremity pain, burning pain in hands and neurosurgery was consulted. Dr. Kathyrn Sheriff recommended transfer to [facility] for MRI.  This was significant for T7 compression fracture and TLSO was fitted.  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  Possible scapholunate ligamentous injury of the left hand, negative imaging of right hand.  She has remained hemodynamically stable. The patient requires inpatient medicine and rehabilitation evaluations and services for ongoing dysfunction secondary to fall resulting in T7 compression fracture, soft tissue injury to left hand.  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 Has seen Dr. Jaynee Eagles in the past. Evaluated for possbile stroke 20  Her long-time PCP retired and she established care with  Not on oral diabetic medications due to multiple allergies.  Review of Systems  Neurological:  Positive for tremors.  Past Medical History:  Diagnosis Date   Dementia ([place])    Diabetes mellitus without complication ([place])    Dyslipidemia    Endometrial cancer ([place]) 2021   Hypertension    Hypothyroidism (acquired)    OSA (obstructive sleep apnea)    not on CPAP   Pernicious anemia    History  reviewed. No pertinent surgical history. History reviewed. No pertinent family history. Social History:  reports that she has never smoked. She has never used smokeless tobacco. She reports that she does not drink alcohol and does not use drugs. Allergies:  Allergies  Allergen Reactions   Acarbose Other (See Comments)   Atenolol Other (See Comments)   Atorvastatin Other (See Comments)   Canagliflozin Other (See Comments)   Crestor [Rosuvastatin] Other (See Comments)   Empagliflozin Other (See Comments)   Glimepiride Other (See Comments)   Losartan Potassium Other (See Comments)   Metformin Other (See Comments)   Other Other (See Comments)   Pioglitazone Other (See Comments)   Pravastatin Other (See Comments)   Spironolactone-Hctz Other (See Comments)   Medications Prior to Admission  Medication Sig Dispense Refill   acetaminophen (TYLENOL) 500 MG tablet Take 1,000 mg by mouth every 6 (six) hours as needed for mild pain.     aspirin 325 MG EC tablet Take 325 mg by mouth daily.     bumetanide (BUMEX) 1 MG tablet Take 1 mg by mouth every morning.     buPROPion (WELLBUTRIN XL) 150 MG 24 hr tablet Take 300 mg by mouth daily.     cholecalciferol (VITAMIN D3) 25 MCG (1000 UNIT) tablet Take 1,000 Units by mouth 2 (two) times a week.     cyanocobalamin (,VITAMIN B-12,) 1000 MCG/ML injection Inject 1,000 mcg into the muscle every 30 (thirty) days.     diphenoxylate-atropine (LOMOTIL) 2.5-0.025 MG tablet Take 1 tablet by mouth 4 (four) times daily as needed for diarrhea or loose stools.     estradiol (ESTRACE) 0.1 MG/GM vaginal cream  Place 1 Applicatorful vaginally 2 (two) times a week.     Evolocumab 140 MG/ML SOSY Inject 140 mg into the skin every 14 (fourteen) days.     levothyroxine (SYNTHROID) 50 MCG tablet Take 50 mcg by mouth every morning.     OVER THE COUNTER MEDICATION Take 1 capsule by mouth daily. Pure Health Ageless Brain  Dietary Supplement      pantoprazole (PROTONIX) 40 MG tablet Take 40 mg by mouth daily as needed Jerrye Bushy).     Potassium 99 MG TABS Take 99 mg by mouth daily.     temazepam (RESTORIL) 15 MG capsule Take 15 mg by mouth at bedtime as needed for sleep.        Home: Home Living Family/patient expects to be discharged to:: Private residence Living Arrangements: Alone, Children Available Help at Discharge: Family, Available PRN/intermittently Type of Home: House Home Access: Stairs to enter Technical brewer of Steps: 2 Keyser: One level Bathroom Shower/Tub: Government social research officer Accessibility: Yes Additional Comments: pt is struggling with some history details, distracted by her neck and back pain, as well as post head pain  Lives With: Alone   Functional History: Prior Function Prior Level of Function : Independent/Modified Independent, Driving Mobility Comments: walked with no AD but has had some falls ADLs Comments: Pt reports she performs ADLs and IADLs including driving and grocery shopping  Functional Status:  Mobility: Bed Mobility Overal bed mobility: Needs Assistance Bed Mobility: Rolling, Sidelying to Sit Rolling: Min guard Sidelying to sit: Min assist Sit to sidelying: Mod assist General bed mobility comments: up in chair upon arrival Transfers Overall transfer level: Needs assistance Equipment used: Rolling walker (2 wheels) Transfers: Sit to/from Stand Sit to Stand: Min guard Bed to/from chair/wheelchair/BSC transfer type:: Step pivot Step pivot transfers: Min assist General transfer comment: cues for hand placement on RW during transfers/ambulation Ambulation/Gait Ambulation/Gait assistance: Min assist Gait Distance (Feet): 45 Feet Assistive device: Rolling walker (2 wheels), 1 person hand held assist Gait Pattern/deviations: Step-through pattern, Step-to pattern, Decreased stride length, Wide base of support General Gait Details: worked on  direction cues, slow to respond to them and to change directions, to stop, to use hand placement to sit safely Gait velocity: reduced Gait velocity interpretation: <1.31 ft/sec, indicative of household ambulator Pre-gait activities: standing balance cues    ADL: ADL Overall ADL's : Needs assistance/impaired Eating/Feeding: Set up, Bed level Grooming: Wash/dry hands, Wash/dry face, Standing Grooming Details (indicate cue type and reason): completed standing at sink Upper Body Bathing: Moderate assistance, Sitting Lower Body Bathing: Moderate assistance, Sit to/from stand Upper Body Dressing : Moderate assistance, Sitting Lower Body Dressing: Supervision/safety, Sitting/lateral leans Lower Body Dressing Details (indicate cue type and reason): dons socks using figure 4 technique Toilet Transfer: Min guard, Rolling walker (2 wheels), Ambulation, Regular Toilet Toileting- Clothing Manipulation and Hygiene: Min guard, Sit to/from stand Toileting - Clothing Manipulation Details (indicate cue type and reason): completes pericare and clothing mgmt Functional mobility during ADLs: Min guard, Rolling walker (2 wheels)  Cognition: Cognition Overall Cognitive Status: No family/caregiver present to determine baseline cognitive functioning Arousal/Alertness: Awake/alert Orientation Level: Oriented X4, Oriented to person, Oriented to place, Oriented to time, Oriented to situation Year:  ("19") Month: February Day of Week: Incorrect Attention: Sustained Sustained Attention: Impaired Sustained Attention Impairment: Verbal basic Memory: Impaired Memory Impairment: Storage deficit, Retrieval deficit Awareness: Impaired Awareness Impairment: Anticipatory impairment Problem Solving: Impaired Problem Solving Impairment: Verbal basic Safety/Judgment: Impaired Cognition Arousal/Alertness: Awake/alert Behavior During  Therapy: WFL for tasks assessed/performed, Anxious Overall Cognitive Status: No  family/caregiver present to determine baseline cognitive functioning General Comments: requires somewhat repetitive cues for mobility particularly directions, but is aware of her braces and the expectation of wearing them  Physical Exam: Blood pressure 135/62, pulse 85, temperature 98.2 F (36.8 C), temperature source Oral, resp. rate 18, height 5' 2.5" (1.588 m), weight 79 kg, SpO2 98 %. Physical Exam HENT:     Head: Normocephalic. No right periorbital erythema.    Results for orders placed or performed during the hospital encounter of 01/30/22 (from the past 48 hour(s))  Glucose, capillary     Status: Abnormal   Collection Time: 02/05/22 11:43 AM  Result Value Ref Range   Glucose-Capillary 144 (H) 70 - 99 mg/dL    Comment: Glucose reference range applies only to samples taken after fasting for at least 8 hours.  Glucose, capillary     Status: Abnormal   Collection Time: 02/05/22  4:15 PM  Result Value Ref Range   Glucose-Capillary 200 (H) 70 - 99 mg/dL    Comment: Glucose reference range applies only to samples taken after fasting for at least 8 hours.  Glucose, capillary     Status: Abnormal   Collection Time: 02/05/22  8:56 PM  Result Value Ref Range   Glucose-Capillary 157 (H) 70 - 99 mg/dL    Comment: Glucose reference range applies only to samples taken after fasting for at least 8 hours.  Glucose, capillary     Status: Abnormal   Collection Time: 02/06/22  7:48 AM  Result Value Ref Range   Glucose-Capillary 154 (H) 70 - 99 mg/dL    Comment: Glucose reference range applies only to samples taken after fasting for at least 8 hours.  Glucose, capillary     Status: Abnormal   Collection Time: 02/06/22 11:23 AM  Result Value Ref Range   Glucose-Capillary 207 (H) 70 - 99 mg/dL    Comment: Glucose reference range applies only to samples taken after fasting for at least 8 hours.  Glucose, capillary     Status: None   Collection Time: 02/06/22  4:25 PM  Result Value Ref Range    Glucose-Capillary 95 70 - 99 mg/dL    Comment: Glucose reference range applies only to samples taken after fasting for at least 8 hours.  Glucose, capillary     Status: Abnormal   Collection Time: 02/06/22  8:50 PM  Result Value Ref Range   Glucose-Capillary 184 (H) 70 - 99 mg/dL    Comment: Glucose reference range applies only to samples taken after fasting for at least 8 hours.  Glucose, capillary     Status: Abnormal   Collection Time: 02/07/22  8:11 AM  Result Value Ref Range   Glucose-Capillary 155 (H) 70 - 99 mg/dL    Comment: Glucose reference range applies only to samples taken after fasting for at least 8 hours.   No results found.    Blood pressure 135/62, pulse 85, temperature 98.2 F (36.8 C), temperature source Oral, resp. rate 18, height 5' 2.5" (1.588 m), weight 79 kg, SpO2 98 %.  Medical Problem List and Plan: 1. Functional deficits secondary to T7 compression fracture status post fall  -patient may *** shower  -ELOS/Goals: *** 2.  Antithrombotics: -DVT/anticoagulation:  Pharmaceutical: Lovenox  -antiplatelet therapy: Aspirin 325 mg daily 3. Pain Management: Neurontin 100 mg 3 times daily, Robaxin,oxycodone as needed, lidocaine patch as needed 4. Mood: LCSW to evaluate and provide emotional support  --  Mild cognitive impairment: Continue Wellbutrin 300 mg daily  -antipsychotic agents: Not applicable 5. Neuropsych: This patient *** capable of making decisions on *** own behalf. 6. Skin/Wound Care: Routine skin care checks  -- forehead laceration: Monitor 7. Fluids/Electrolytes/Nutrition: Routine I's and O's and follow-up chemistries 8.  Diabetes mellitus: CBGs and sliding scale insulin, carb modified diet  -- Does not appear she was on home medications 9.  Hypertension: On Bumex at home, currently held 10: Hyperlipidemia: On Repatha at home q 14 days 11: Obstructive sleep apnea:  12: Pernicious anemia: Normal H&H and normal red blood cell indices --B 12  injection monthly 13: Hypothyroidism: Continue Synthroid 50 mcg every morning 14: Moderate malnutrition secondary to chronic illness: Follow-up albumin, total protein 15: Chronic upper extremity tremor:  16: T7 fracture: TSLO brace 17: Left hand soft tissue injury: splint   ***  Barbie Banner, PA-C 02/07/2022

## 2022-02-07 NOTE — Progress Notes (Signed)
Inpatient Rehab Admissions Coordinator:   I have a CIR bed for this Pt. And can admit to rehab today. She won her expedited appeal. RN may call report to [phone].  Clemens Catholic, [place], [place] Admissions Coordinator  [phone] ([place]) [phone] (office)

## 2022-02-07 NOTE — Progress Notes (Signed)
Patient transferred to CIR.

## 2022-02-07 NOTE — Progress Notes (Incomplete)
Inpatient Rehabilitation Admission Medication Review by a Pharmacist  A complete drug regimen review was completed for this patient to identify any potential clinically significant medication issues.  High Risk Drug Classes Is patient taking? Indication by Medication  Antipsychotic Yes Compazine- N/V  Anticoagulant Yes Lovenox- VTE prophylaxis  Antibiotic No   Opioid Yes OxyIR- acute pain needs  Antiplatelet Yes Aspirin- CVA prophylaxis  Hypoglycemics/insulin Yes iSS- T2DM  Vasoactive Medication No   Chemotherapy No   Other Yes Trazodone- sleep Wellbutrin- MDD Synthroid- hypothyroidism Lidoderm patch- local pain     Type of Medication Issue Identified Description of Issue Recommendation(s)  Drug Interaction(s) (clinically significant)     Duplicate Therapy     Allergy     No Medication Administration End Date     Incorrect Dose     Additional Drug Therapy Needed     Significant med changes from prior encounter (inform family/care partners about these prior to discharge).    Other  PTA meds: Bumex D-3 B-12 Estrace Evolumab Protonix restoril Retart home meds when and if clinically necessary during CIR admission or at time of discharge.     Clinically significant medication issues were identified that warrant physician communication and completion of prescribed/recommended actions by midnight of the next day:  No   Time spent performing this drug regimen review (minutes):  30   Adilson Grafton BS, PharmD, BCPS Clinical Pharmacist 02/07/2022 12:07 PM

## 2022-02-07 NOTE — Progress Notes (Signed)
PMR Admission Coordinator Pre-Admission Assessment ° °Patient: [name] is an 71 y.o., female °MRN: [id number] °DOB: 06/28/1951 °Height: 5' 2.5" (158.8 cm) °Weight: 79 kg ° °Insurance Information °HMO:   yes  PPO:      PCP:      IPA:      80/20: yes     OTHER:  °PRIMARY: [employer] Medicare      Policy#: [id number]      Subscriber:  °CM Name: na       Phone#: [phone]     Fax#:  [phone] °Pre-Cert#: [id number]      Employer:  °Benefits:  Phone #:      Name: I received notification that appeal was overturned from Jen with [employer]. Pt. Approved 2/22-2/29 with updates due 2/28.  °Eff Date: 01/17/2022 - still active °Deductible: does not have one °OOP Max: $8,300 ($42.18 met) °CIR: $1,556/ admission copay °SNF: $0.00 Copayment per day for days 1-20; $200 Copayment per day for days 21-100; Maximum of 100 days/benefit period °Outpatient: 80% coverage; 20% co-insurance °Home Health:  100% coverage, limited by medical necessity °DME: 80% coverage; 20% co-insurance °Providers: in network  °SECONDARY: Medicaid [place] Access      Policy#:  [id number]    Phone#:  ° °Financial Counselor:       Phone#:  ° °The “Data Collection Information Summary” for patients in Inpatient Rehabilitation Facilities with attached “Privacy Act Statement-Health Care Records” was provided and verbally reviewed with: Family ° °Emergency Contact Information °Contact Information   ° ° Name Relation Home Work Mobile  ° Medina,Ammeria Granddaughter   [phone]  ° °  ° ° °Current Medical History  °Patient Admitting Diagnosis: t7 fracture °History of Present Illness: Isabella Musson is a 71 y.o. female with medical history significant of DM, HTN, dementia, and pernicious anemia who presented  after falling over a cord and striking the top of her head on 2/14.   MRI of the thoracic spine reveals likely acute to subacute superior endplate fracture at T7 without significant loss of height.  There is also chronic compression fracture at L1. CT of  head negative for acute abnormality.  X-ray of hand shows possible scapholunate ligamentous injury on the left (likely chronic) with negative imaging Rt hand.  MRI of C-Spine shows severe neural foraminal stenosis C5-6. She was seen by PT/OT/SLP who recommended CIR to assist return to PLOF.  ° °Patient's medical record from [facility] has been reviewed by the rehabilitation admission coordinator and physician. ° °Past Medical History  °Past Medical History:  °Diagnosis Date  ° Dementia (HCC)   ° Diabetes mellitus without complication (HCC)   ° Dyslipidemia   ° Endometrial cancer (HCC) 2021  ° Hypertension   ° Hypothyroidism (acquired)   ° OSA (obstructive sleep apnea)   ° not on CPAP  ° Pernicious anemia   ° ° °Has the patient had major surgery during 100 days prior to admission? No ° °Family History   °family history is not on file. ° °Current Medications ° °Current Facility-Administered Medications:  °  acetaminophen (TYLENOL) tablet 650 mg, 650 mg, Oral, Q6H PRN, 650 mg at 02/04/22 0543 **OR** acetaminophen (TYLENOL) suppository 650 mg, 650 mg, Rectal, Q6H PRN, Yates, Jennifer, MD °  aspirin EC tablet 325 mg, 325 mg, Oral, Daily, Yates, Jennifer, MD, 325 mg at 02/04/22 0954 °  bisacodyl (DULCOLAX) EC tablet 5 mg, 5 mg, Oral, Daily PRN, Yates, Jennifer, MD °  buPROPion (WELLBUTRIN XL) 24 hr tablet 300 mg, 300 mg,   Oral, Daily, Yates, Jennifer, MD, 300 mg at 02/04/22 0953 °  calamine lotion 1 application, 1 application, Topical, BID, Amery, Sahar, MD °  docusate sodium (COLACE) capsule 100 mg, 100 mg, Oral, BID, Yates, Jennifer, MD, 100 mg at 02/04/22 0954 °  enoxaparin (LOVENOX) injection 40 mg, 40 mg, Subcutaneous, Daily, Yates, Jennifer, MD, 40 mg at 02/04/22 0953 °  feeding supplement (ENSURE ENLIVE / ENSURE PLUS) liquid 237 mL, 237 mL, Oral, BID BM, Amery, Sahar, MD, 237 mL at 02/04/22 1000 °  gabapentin (NEURONTIN) capsule 100 mg, 100 mg, Oral, TID, Amery, Sahar, MD, 100 mg at 02/04/22 0954 °   hydrALAZINE (APRESOLINE) injection 5 mg, 5 mg, Intravenous, Q4H PRN, Yates, Jennifer, MD °  insulin aspart (novoLOG) injection 0-15 Units, 0-15 Units, Subcutaneous, TID WC, Yates, Jennifer, MD, 3 Units at 02/04/22 0815 °  insulin aspart (novoLOG) injection 0-5 Units, 0-5 Units, Subcutaneous, QHS, Yates, Jennifer, MD °  levothyroxine (SYNTHROID) tablet 50 mcg, 50 mcg, Oral, q morning, Yates, Jennifer, MD, 50 mcg at 02/04/22 0715 °  lidocaine (LIDODERM) 5 % 2 patch, 2 patch, Transdermal, Q24H, Amery, Sahar, MD °  methocarbamol (ROBAXIN) 500 mg in dextrose 5 % 50 mL IVPB, 500 mg, Intravenous, Q6H PRN, Yates, Jennifer, MD °  morphine (PF) 2 MG/ML injection 2 mg, 2 mg, Intravenous, Q2H PRN, Yates, Jennifer, MD, 2 mg at 02/02/22 0347 °  multivitamin with minerals tablet 1 tablet, 1 tablet, Oral, Daily, Amery, Sahar, MD, 1 tablet at 02/04/22 0954 °  ondansetron (ZOFRAN) tablet 4 mg, 4 mg, Oral, Q6H PRN **OR** ondansetron (ZOFRAN) injection 4 mg, 4 mg, Intravenous, Q6H PRN, Yates, Jennifer, MD, 4 mg at 01/31/22 1841 °  oxyCODONE (Oxy IR/ROXICODONE) immediate release tablet 5 mg, 5 mg, Oral, Q4H PRN, Yates, Jennifer, MD, 5 mg at 02/03/22 2240 °  polyethylene glycol (MIRALAX / GLYCOLAX) packet 17 g, 17 g, Oral, Daily PRN, Yates, Jennifer, MD °  sodium chloride flush (NS) 0.9 % injection 3 mL, 3 mL, Intravenous, Q12H, Yates, Jennifer, MD, 3 mL at 02/04/22 1000 °  temazepam (RESTORIL) capsule 15 mg, 15 mg, Oral, QHS PRN, Yates, Jennifer, MD ° °Patients Current Diet:  °Diet Order   ° °       °  Diet regular Room service appropriate? Yes with Assist; Fluid consistency: Thin  Diet effective now       °  ° °  °  ° °  ° ° °Precautions / Restrictions °Precautions °Precautions: Back, Fall °Precaution Booklet Issued: Yes (comment) °Precaution Comments: verbally reviewed, pt does not have reading glasses to see handout well °Spinal Brace: Thoracolumbosacral orthotic °Restrictions °Weight Bearing Restrictions: No  ° °Has the patient had  2 or more falls or a fall with injury in the past year? Yes ° °Prior Activity Level °Community (5-7x/wk): Pt. was active in the community PTA ° °Prior Functional Level °Self Care: Did the patient need help bathing, dressing, using the toilet or eating? Independent ° °Indoor Mobility: Did the patient need assistance with walking from room to room (with or without device)? Independent ° °Stairs: Did the patient need assistance with internal or external stairs (with or without device)? Independent ° °Functional Cognition: Did the patient need help planning regular tasks such as shopping or remembering to take medications? Independent ° °Patient Information °Are you of Hispanic, Latino/a,or Spanish origin?: A. No, not of Hispanic, Latino/a, or Spanish origin °What is your race?: A. White °Do you need or want an interpreter to communicate with a doctor or   health care staff?: 0. No ° °Patient's Response To:  °Health Literacy and Transportation °Is the patient able to respond to health literacy and transportation needs?: Yes °Health Literacy - How often do you need to have someone help you when you read instructions, pamphlets, or other written material from your doctor or pharmacy?: Sometimes °In the past 12 months, has lack of transportation kept you from medical appointments or from getting medications?: Yes °In the past 12 months, has lack of transportation kept you from meetings, work, or from getting things needed for daily living?: Yes ° °Home Assistive Devices / Equipment °  ° °Prior Device Use: Indicate devices/aids used by the patient prior to current illness, exacerbation or injury? None of the above ° °Current Functional Level °Cognition ° Arousal/Alertness: Awake/alert °Overall Cognitive Status: No family/caregiver present to determine baseline cognitive functioning °Orientation Level: Oriented to person, Oriented to place, Oriented to situation °General Comments: Pt more participatory, able to state need to  use call bell to get up when prompted "what if you need to get up?" at end of session. Pt with no recall of back precautions at beginning of session, pt given handout (she reports not having reading glasses) so given verbal review, pt needs reminder x2 to avoid bending/twisting during mobility tasks. Pt receptive to instruction and eager to mobilize, wanting to take a bath, NT notified. °Attention: Sustained °Sustained Attention: Impaired °Sustained Attention Impairment: Verbal basic °Memory: Impaired °Memory Impairment: Storage deficit, Retrieval deficit °Awareness: Impaired °Awareness Impairment: Anticipatory impairment °Problem Solving: Impaired °Problem Solving Impairment: Verbal basic °Safety/Judgment: Impaired °   °Extremity Assessment °(includes Sensation/Coordination) ° Upper Extremity Assessment: Defer to OT evaluation °RUE Deficits / Details: full ROM.  Decreased active flexion of digits 2 and 3.  Tremor noted.  Arthritic deformity noted °RUE Sensation: decreased light touch °RUE Coordination: decreased fine motor, decreased gross motor °LUE Deficits / Details: Full AROM.  tremor noted.  arthritic deformity noted °LUE Sensation: decreased light touch °LUE Coordination: decreased fine motor, decreased gross motor  °Lower Extremity Assessment: Generalized weakness  °  °ADLs ° Overall ADL's : Needs assistance/impaired °Eating/Feeding: Set up, Bed level °Grooming: Wash/dry hands, Wash/dry face, Minimal assistance, Standing °Upper Body Bathing: Moderate assistance, Sitting °Lower Body Bathing: Moderate assistance, Sit to/from stand °Upper Body Dressing : Moderate assistance, Sitting °Lower Body Dressing: Moderate assistance, Sit to/from stand °Lower Body Dressing Details (indicate cue type and reason): able to perform figure 4.  Donned socks with min A °Toilet Transfer: Minimal assistance, Ambulation, Comfort height toilet, Rolling walker (2 wheels) °Toileting- Clothing Manipulation and Hygiene: Maximal  assistance, Sit to/from stand °Functional mobility during ADLs: Minimal assistance, Rolling walker (2 wheels)  °  °Mobility ° Overal bed mobility: Needs Assistance °Bed Mobility: Rolling, Sidelying to Sit °Rolling: Min guard °Sidelying to sit: Min assist °Sit to sidelying: Mod assist °General bed mobility comments: verbally instructed along with physical help to maintain spinal precautions before getting on side of bed, pt with no recall from previous session  °  °Transfers ° Overall transfer level: Needs assistance °Equipment used: Rolling walker (2 wheels) °Transfers: Sit to/from Stand °Sit to Stand: Min assist, Mod assist °Bed to/from chair/wheelchair/BSC transfer type:: Step pivot °Step pivot transfers: Min assist °General transfer comment: from EOB>RW then RW>chair, cues needed for safe UE placement and pt "plops" unsafely with stand>sit needing modA for controlled lowering  °  °Ambulation / Gait / Stairs / Wheelchair Mobility ° Ambulation/Gait °Ambulation/Gait assistance: Min assist °Gait Distance (Feet): 30 Feet °Assistive device: Rolling   walker (2 wheels), 1 person hand held assist °Gait Pattern/deviations: Step-to pattern, Decreased stride length, Shuffle °General Gait Details: cued pt to slide walker and avoid lifting it, to stand closer within RW, upright posture; no LOB, min cues also not to turn head/twist back with fair carryover. °Gait velocity: reduced °Gait velocity interpretation: <1.31 ft/sec, indicative of household ambulator °Pre-gait activities: standing balance cues  °  °Posture / Balance Balance °Overall balance assessment: Needs assistance °Sitting-balance support: Feet supported °Sitting balance-Leahy Scale: Good °Standing balance support: During functional activity, Bilateral upper extremity supported °Standing balance-Leahy Scale: Fair °Standing balance comment: fair balance using RW, poor balance without AD  °  °Special needs/care consideration Skin ecchymosis on R upper face and Special  service needs Thoracolumbosacral orthotic spinal brace when OOB  ° °Previous Home Environment (from acute therapy documentation) °Living Arrangements: Alone, Children ° Lives With: Alone °Available Help at Discharge: Family, Available PRN/intermittently °Type of Home: House °Home Layout: One level °Home Access: Stairs to enter °Entrance Stairs-Number of Steps: 2 °Bathroom Shower/Tub: Tub/shower unit °Bathroom Toilet: Standard °Bathroom Accessibility: Yes °Home Care Services: No °Additional Comments: pt is struggling with some history details, distracted by her neck and back pain, as well as post head pain ° °Discharge Living Setting °Plans for Discharge Living Setting: Patient's home °Type of Home at Discharge: House °Discharge Home Layout: One level °Discharge Home Access: Stairs to enter °Entrance Stairs-Rails: None °Entrance Stairs-Number of Steps: 2 °Discharge Bathroom Shower/Tub: Tub/shower unit °Discharge Bathroom Toilet: Standard °Discharge Bathroom Accessibility: Yes °Does the patient have any problems obtaining your medications?: No ° °Social/Family/Support Systems °Patient Roles: Other (Comment) °Contact Information: [phone] °Anticipated Caregiver: Ammeria (grandaughter) °Ability/Limitations of Caregiver: Min A °Caregiver Availability: 24/7 °Discharge Plan Discussed with Primary Caregiver: Yes °Is Caregiver In Agreement with Plan?: Yes °Does Caregiver/Family have Issues with Lodging/Transportation while Pt is in Rehab?: Yes ° °Goals °Patient/Family Goal for Rehab: PT/OT/SLP supervision °Expected length of stay: 7-10 days °Pt/Family Agrees to Admission and willing to participate: Yes °Program Orientation Provided & Reviewed with Pt/Caregiver Including Roles  & Responsibilities: Yes ° °Decrease burden of Care through IP rehab admission: Specialzed equipment needs, Decrease number of caregivers, Bowel and bladder program, and Patient/family education ° °Possible need for SNF placement upon discharge: not  anticipated ° °Patient Condition: I have reviewed medical records from [facility], spoken with CM, and patient and family member. I met with patient at the bedside for inpatient rehabilitation assessment.  Patient will benefit from ongoing PT, OT, and SLP, can actively participate in 3 hours of therapy a day 5 days of the week, and can make measurable gains during the admission.  Patient will also benefit from the coordinated team approach during an Inpatient Acute Rehabilitation admission.  The patient will receive intensive therapy as well as Rehabilitation physician, nursing, social worker, and care management interventions.  Due to safety, skin/wound care, disease management, medication administration, pain management, and patient education the patient requires 24 hour a day rehabilitation nursing.  The patient is currently Min A-min g with mobility and basic ADLs.  Discharge setting and therapy post discharge at home with home health is anticipated.  Patient has agreed to participate in the Acute Inpatient Rehabilitation Program and will admit today. ° °Preadmission Screen Completed By:  Arie Gable B Darell Saputo, 02/04/2022 11:53 AM °______________________________________________________________________   °Discussed status with Dr. Raulkar  on 02/07/22  at 1000 and received approval for admission today. ° °Admission Coordinator:  Mana Morison B Austen Oyster, CCC-SLP, time 1000/Date 02/07/22  ° °  Assessment/Plan: °Diagnosis: Multiple compression fractures °Does the need for close, 24 hr/day Medical supervision in concert with the patient's rehab needs make it unreasonable for this patient to be served in a less intensive setting? Yes °Co-Morbidities requiring supervision/potential complications: moderate cervical spinal stenosis, bilateral hand pain, HTN, dyslipidemia, type 2 DM °Due to bladder management, bowel management, safety, skin/wound care, disease management, medication administration, pain management, and  patient education, does the patient require 24 hr/day rehab nursing? Yes °Does the patient require coordinated care of a physician, rehab nurse, PT, OT, and SLP to address physical and functional deficits in the context of the above medical diagnosis(es)? Yes °Addressing deficits in the following areas: balance, endurance, locomotion, strength, transferring, bowel/bladder control, bathing, dressing, feeding, grooming, toileting, cognition, and psychosocial support °Can the patient actively participate in an intensive therapy program of at least 3 hrs of therapy 5 days a week? Yes °The potential for patient to make measurable gains while on inpatient rehab is excellent °Anticipated functional outcomes upon discharge from inpatient rehab: supervision PT, supervision OT, supervision SLP °Estimated rehab length of stay to reach the above functional goals is: 10-12 days °Anticipated discharge destination: Home °10. Overall Rehab/Functional Prognosis: excellent ° ° °MD Signature: °Krutika Raulkar, MD °

## 2022-02-08 ENCOUNTER — Ambulatory Visit: Payer: Medicare Other | Admitting: Neurology

## 2022-02-08 LAB — CBC WITH DIFFERENTIAL/PLATELET
Abs Immature Granulocytes: 0.01 10*3/uL (ref 0.00–0.07)
Basophils Absolute: 0 10*3/uL (ref 0.0–0.1)
Basophils Relative: 1 %
Eosinophils Absolute: 0.1 10*3/uL (ref 0.0–0.5)
Eosinophils Relative: 3 %
HCT: 31 % — ABNORMAL LOW (ref 36.0–46.0)
Hemoglobin: 10.5 g/dL — ABNORMAL LOW (ref 12.0–15.0)
Immature Granulocytes: 0 %
Lymphocytes Relative: 26 %
Lymphs Abs: 1.1 10*3/uL (ref 0.7–4.0)
MCH: 30.3 pg (ref 26.0–34.0)
MCHC: 33.9 g/dL (ref 30.0–36.0)
MCV: 89.6 fL (ref 80.0–100.0)
Monocytes Absolute: 0.4 10*3/uL (ref 0.1–1.0)
Monocytes Relative: 10 %
Neutro Abs: 2.4 10*3/uL (ref 1.7–7.7)
Neutrophils Relative %: 60 %
Platelets: 268 10*3/uL (ref 150–400)
RBC: 3.46 MIL/uL — ABNORMAL LOW (ref 3.87–5.11)
RDW: 14.2 % (ref 11.5–15.5)
WBC: 4 10*3/uL (ref 4.0–10.5)
nRBC: 0 % (ref 0.0–0.2)

## 2022-02-08 LAB — COMPREHENSIVE METABOLIC PANEL
ALT: 15 U/L (ref 0–44)
AST: 19 U/L (ref 15–41)
Albumin: 2.7 g/dL — ABNORMAL LOW (ref 3.5–5.0)
Alkaline Phosphatase: 46 U/L (ref 38–126)
Anion gap: 8 (ref 5–15)
BUN: 29 mg/dL — ABNORMAL HIGH (ref 8–23)
CO2: 27 mmol/L (ref 22–32)
Calcium: 9 mg/dL (ref 8.9–10.3)
Chloride: 103 mmol/L (ref 98–111)
Creatinine, Ser: 0.99 mg/dL (ref 0.44–1.00)
GFR, Estimated: >60 mL/min (ref >60)
Glucose, Bld: 164 mg/dL — ABNORMAL HIGH (ref 70–99)
Potassium: 4.1 mmol/L (ref 3.5–5.1)
Sodium: 138 mmol/L (ref 135–145)
Total Bilirubin: 0.6 mg/dL (ref 0.3–1.2)
Total Protein: 5.6 g/dL — ABNORMAL LOW (ref 6.5–8.1)

## 2022-02-08 LAB — GLUCOSE, CAPILLARY
Glucose-Capillary: 147 mg/dL — ABNORMAL HIGH (ref 70–99)
Glucose-Capillary: 181 mg/dL — ABNORMAL HIGH (ref 70–99)
Glucose-Capillary: 167 mg/dL — ABNORMAL HIGH (ref 70–99)
Glucose-Capillary: 162 mg/dL — ABNORMAL HIGH (ref 70–99)

## 2022-02-08 NOTE — Plan of Care (Signed)
°  Problem: RH Attention Goal: LTG Patient will demonstrate this level of attention during functional activites (SLP) Description: LTG:  Patient will will demonstrate this level of attention during functional activites (SLP) 02/08/2022 1551 by Charna Elizabeth T, CCC-SLP Outcome: Not Applicable Note: Goal discontinued to focus on other goals 02/08/2022 1245 by Charna Elizabeth T, CCC-SLP Flowsheets (Taken 02/08/2022 1245) Patient will demonstrate during cognitive/linguistic activities the attention type of: Alternating LTG: Patient will demonstrate this level of attention during cognitive/linguistic activities with assistance of (SLP): Modified Independent

## 2022-02-08 NOTE — Plan of Care (Signed)
°  Problem: RH Problem Solving Goal: LTG Patient will demonstrate problem solving for (SLP) Description: LTG:  Patient will demonstrate problem solving for basic/complex daily situations with cues  (SLP) Flowsheets (Taken 02/08/2022 1245) LTG: Patient will demonstrate problem solving for (SLP): Complex daily situations LTG Patient will demonstrate problem solving for: Modified Independent   Problem: RH Memory Goal: LTG Patient will use memory compensatory aids to (SLP) Description: LTG:  Patient will use memory compensatory aids to recall biographical/new, daily complex information with cues (SLP) Flowsheets (Taken 02/08/2022 1245) LTG: Patient will use memory compensatory aids to (SLP): Modified Independent

## 2022-02-08 NOTE — Progress Notes (Signed)
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 name of MD    Dementia ([place])    Diabetes mellitus    type 2    Diabetes mellitus without complication ([place])    Dyslipidemia    Dyspnea    with exertion    Dysrhythmia    "skips beats"   Endometrial cancer ([place]) 2021   Family history of adverse reaction to anesthesia    son stopped breathing during surgery , mother slow to wake up    Fibromyalgia 1980's   Generalized anxiety disorder    GERD (gastroesophageal reflux disease)    occ- will use Tums and Prolosec  prn    Heart murmur    Hyperlipemia    Hypertension    not on blood pressure meds due to allergies - last dose of meds- months ago    Hypertension    Hypothyroidism    Hypothyroidism (acquired)    Irritable bowel syndrome 1980's   Major depressive disorder    Migraine headaches 1980's   On meds-well controlled   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   Morbid obesity ([place])    Pernicious anemia    Pneumonia 2009   Several times over the past several years   PONV (postoperative nausea and vomiting)    was told by neurologist due to calcificaton in brain not to be put to sleep, N/V during Op and Recovery    Recurrent upper respiratory infection (URI)    Sleep apnea    no cpap   Subarachnoid hemorrhage ([place]) 2010   Vaginal polyp    benign per pt   Past Surgical History:  Past Surgical History:   Procedure Laterality Date   CHOLECYSTECTOMY  1983   COLONOSCOPY  2008   DB   DILATION AND CURETTAGE OF UTERUS  1972   HYSTEROSCOPY WITH D & C  06/29/2011   Procedure: DILATATION AND CURETTAGE (D&C) /HYSTEROSCOPY;  Surgeon: Donnamae Jude, MD;  Location: [facility];  Service: Gynecology;  Laterality: N/A;   ROBOTIC ASSISTED TOTAL HYSTERECTOMY WITH BILATERAL SALPINGO OOPHERECTOMY N/A 10/27/2020   Procedure: XI ROBOTIC ASSISTED TOTAL HYSTERECTOMY WITH BILATERAL SALPINGO OOPHORECTOMY;  Surgeon: Everitt Amber, MD;  Location: [facility];  Service: Gynecology;  Laterality: N/A;   SENTINEL NODE BIOPSY N/A 10/27/2020   Procedure: SENTINEL NODE BIOPSY;  Surgeon: Everitt Amber, MD;  Location: [facility];  Service: Gynecology;  Laterality: N/A;   Social History:  reports that she has never smoked. She has never used smokeless tobacco. She reports that she does not drink alcohol and does not use drugs.  Family / Support Systems Marital Status: Separated Patient Roles: Parent, Other (Comment) (grandmother) Children: Darcey-daughter [phone] Other Supports: Ammeria-granddaughter [phone] Anticipated Caregiver: Ammeria Ability/Limitations of Caregiver: Ocie Doyne does work and goes to school, pt's daughter has mental health issues and can not be depended upon Caregiver Availability: Other (  Comment) (Will need to be mod/i before going home) Family Dynamics: Close with granddaughter and her daughter does come and stay with her every once in awhile. Pt raised her granddaughter and she does live with her.  Social History Preferred language: English Religion: Baptist Cultural Background: No issues Education: [place] - How often do you need to have someone help you when you read instructions, pamphlets, or other written material from your doctor or pharmacy?: Sometimes Writes: Yes Employment Status: Retired Public relations account executive Issues: No issues Guardian/Conservator: None-according to MD pt is capable  of making her own decisions while here. Her granddaughter visits and will bring her what she needs here   Abuse/Neglect Abuse/Neglect Assessment Can Be Completed: Yes Physical Abuse: Denies Verbal Abuse: Denies Sexual Abuse: Denies Exploitation of patient/patient's resources: Denies Self-Neglect: Denies  Patient response to: Social Isolation - How often do you feel lonely or isolated from those around you?: Rarely  Emotional Status Pt's affect, behavior and adjustment status: Pt was independent and drove, she had issues with her dizziness but has stopped her migraine med she had been on for years and found this really helped her dizziness. She wants to get back to her independent level but is having pain issues due to fractures. She will do what she can and be patient to allow her body to heal Recent Psychosocial Issues: other health issues-some faling prior to admission due to dizziness Psychiatric History: History of depression she see;s a psychiatrist for and finds them helpful wil continue when discharged from here Substance Abuse History: Pt reports no history question in chart regarding ETOH use. Wil continue to address while here  Patient / Family Perceptions, Expectations & Goals Pt/Family understanding of illness & functional limitations: Pt is able to explain her fall and fracutures, she is having pain but MD is aware of this and working with her on. Pt does talk with the MD and feels she has a good understanding of her treatment plan moving forward. She is glad she did not fracture anything in her head or face Premorbid pt/family roles/activities: mom, grandmother, retiree, home owner, church member Anticipated changes in roles/activities/participation: resume Pt/family expectations/goals: Pt states: " I need to be independent again when I leave, my granddaughter is a busy person."  Recruitment consultant: None Premorbid Home Care/DME Agencies: Other (Comment)  (cane used outside her home) Transportation available at discharge: Pt drove will rely upon granddaughter if needs to go somewhere Is the patient able to respond to transportation needs?: Yes In the past 12 months, has lack of transportation kept you from medical appointments or from getting medications?: Yes In the past 12 months, has lack of transportation kept you from meetings, work, or from getting things needed for daily living?: Yes  Discharge Planning Living Arrangements: Other relatives Support Systems: Children, Other relatives, Friends/neighbors Type of Residence: Private residence Insurance Resources: Multimedia programmer (specify) Primary school teacher) Financial Resources: Radio broadcast assistant Screen Referred: No Living Expenses: Own Money Management: Patient Does the patient have any problems obtaining your medications?: No Home Management: Patient did PTA granddaughter to assist now Patient/Family Preliminary Plans: Return home with granddaughter who is in and out, will need to be mod/i to be safe home alone. Daughter stays at pt's home at times also. Will await team's evaluations and work on safe plan for her. Care Coordinator Barriers to Discharge: Decreased caregiver support, Insurance for SNF coverage, Other (comments) Care Coordinator Barriers to Discharge Comments: Falling PTA before this fall Care Coordinator  Anticipated Follow Up Needs: HH/OP  Clinical Impression Pleasant female who unfortunately fell and cause some damage this time. She feels since she stopped taking her migraine medications her dizziness has been better, but she still fell and is here now. Her granddaughter lives with her but goes to school and works and her daughter has mental health issues and is not able to be depended upon. Will await therapy evaluations and work on discharge needs.  Elease Hashimoto 02/08/2022, 1:13 PM

## 2022-02-08 NOTE — Evaluation (Signed)
Occupational Therapy Assessment and Plan  Patient Details  Name: [name] MRN: [id number] Date of Birth: 1951/09/06  OT Diagnosis: acute pain and pain in thoracic spine, generalized weakness  Rehab Potential: Rehab Potential (ACUTE ONLY): Excellent ELOS: 7 days   Today's Date: 02/08/2022 OT Individual Time: 0623-7628 OT Individual Time Calculation (min): 65 min     Hospital Problem: Principal Problem:   T7 vertebral fracture (HCC)   Past Medical History:  Past Medical History:  Diagnosis Date   Allergy    Anemia    Arthritis    Asthma    Dianosed years ago-no meds at this time   Cancer [facility]) 2021   endometrial    Cataract    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 name of MD    Dementia ([place])    Diabetes mellitus    type 2    Diabetes mellitus without complication ([place])    Dyslipidemia    Dyspnea    with exertion    Dysrhythmia    "skips beats"   Endometrial cancer ([place]) 2021   Family history of adverse reaction to anesthesia    son stopped breathing during surgery , mother slow to wake up    Fibromyalgia 1980's   Generalized anxiety disorder    GERD (gastroesophageal reflux disease)    occ- will use Tums and Prolosec  prn    Heart murmur    Hyperlipemia    Hypertension    not on blood pressure meds due to allergies - last dose of meds- months ago    Hypertension    Hypothyroidism    Hypothyroidism (acquired)    Irritable bowel syndrome 1980's   Major depressive disorder    Migraine headaches 1980's   On meds-well controlled   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   Morbid obesity ([place])    Pernicious anemia    Pneumonia 2009   Several times over the past several years   PONV (postoperative nausea and vomiting)    was told by neurologist due to calcificaton in brain not to be put to sleep, N/V during Op and  Recovery    Recurrent upper respiratory infection (URI)    Sleep apnea    no cpap   Subarachnoid hemorrhage ([place]) 2010   Vaginal polyp    benign per pt   Past Surgical History:  Past Surgical History:  Procedure Laterality Date   CHOLECYSTECTOMY  1983   COLONOSCOPY  2008   DB   DILATION AND CURETTAGE OF UTERUS  1972   HYSTEROSCOPY WITH D & C  06/29/2011   Procedure: DILATATION AND CURETTAGE (D&C) /HYSTEROSCOPY;  Surgeon: Donnamae Jude, MD;  Location: [facility];  Service: Gynecology;  Laterality: N/A;   ROBOTIC ASSISTED TOTAL HYSTERECTOMY WITH BILATERAL SALPINGO OOPHERECTOMY N/A 10/27/2020   Procedure: XI ROBOTIC ASSISTED TOTAL HYSTERECTOMY WITH BILATERAL SALPINGO OOPHORECTOMY;  Surgeon: Everitt Amber, MD;  Location: [facility];  Service: Gynecology;  Laterality: N/A;   SENTINEL NODE BIOPSY N/A 10/27/2020   Procedure: SENTINEL NODE BIOPSY;  Surgeon: Everitt Amber, MD;  Location: [facility];  Service: Gynecology;  Laterality: N/A;    Assessment & Plan Clinical Impression: [name] is a 71 year old female with a history of chronic dizziness and tremors who fell at home, unwitnessed.  She presented to [place] on 01/30/2022 complaining of diffuse pain  particularly of bilateral upper extremities.  She sustained a right frontal laceration with hematoma.  CT scan of head and C-spine were negative.  She complained of significant bilateral upper extremity pain and neurosurgery was consulted. Dr. Kathyrn Sheriff recommended [facility].  This was significant for T7 compression fracture and TLSO was fitted.  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  Possible scapholunate ligamentous injury of the left hand, negative imaging of right hand.  She has remained hemodynamically stable. The patient requires inpatient medicine and rehabilitation evaluations and services for ongoing dysfunction secondary to fall resulting in T7 compression fracture, soft  tissue injury to left hand.   History of chronic diarrhea, s/p colonoscopy with pre-cancerous polyp. She is very hesitant about using any laxative and needs to be mild formula.   History of chronic bilateral hand tremors/weakness and falls and Fahr's syndrome has been considered. She has referral to [facility] Neuro movement disorder specialist. Chronic dizziness whick improved once this was weaned off. She was on it for migraines in the distant past.   Not on DM medications due to multiple allergies. Evaluated for possible stroke in 2021 and had negative work-up except asymptomatic carotid disease.   Lives at times with her daughter and granddaughter. Daughter [name] has schizo-affective disorder. Strained relationships with both sons due to their alcoholism.   Patient transferred to [facility] on 02/07/2022 .    Patient currently requires supervision with basic self-care skills secondary to muscle weakness and decreased standing balance and decreased balance strategies.  Prior to hospitalization, patient was fully independent.  Patient will benefit from skilled intervention to increase independence with basic self-care skills prior to discharge home with care partner.  Anticipate patient will require intermittent supervision and follow up home health.  OT - End of Session Activity Tolerance: Tolerates 30+ min activity with multiple rests Endurance Deficit: Yes OT Assessment Rehab Potential (ACUTE ONLY): Excellent OT Patient demonstrates impairments in the following area(s): Pain;Endurance OT Basic ADL's Functional Problem(s): Bathing;Dressing;Toileting OT Advanced ADL's Functional Problem(s): Light Housekeeping OT Transfers Functional Problem(s): Toilet;Tub/Shower OT Additional Impairment(s): None OT Plan OT Intensity: Minimum of 1-2 x/day, 45 to 90 minutes OT Frequency: 5 out of 7 days OT Duration/Estimated Length of Stay: 7 days OT Treatment/Interventions: Discharge planning;DME/adaptive  equipment instruction;Functional mobility training;Patient/family education;Self Care/advanced ADL retraining;Therapeutic Activities;Therapeutic Exercise OT Self Feeding Anticipated Outcome(s): no goal, pt is independent OT Basic Self-Care Anticipated Outcome(s): Mod I OT Toileting Anticipated Outcome(s): Mod I OT Bathroom Transfers Anticipated Outcome(s): Mod I OT Recommendation Patient destination: Home Follow Up Recommendations: Home health OT Equipment Recommended: Tub/shower bench   OT Evaluation Precautions/Restrictions  Precautions Precautions: Back;Fall Required Braces or Orthoses: Splint/Cast;Spinal Brace Spinal Brace: Thoracolumbosacral orthotic Restrictions Weight Bearing Restrictions: No   Pain Pain Assessment Pain Scale: 0-10 Pain Score: 8  Pain Type: Acute pain Pain Location: Shoulder Pain Orientation: Left Pain Radiating Towards: mid-lowe back Pain Descriptors / Indicators: Aching Pain Frequency: Constant Pain Onset: On-going Pain Intervention(s): Medication (See eMAR) Home Living/Prior Functioning Home Living Family/patient expects to be discharged to:: Private residence Living Arrangements: Alone, Children Available Help at Discharge: Family, Available PRN/intermittently Type of Home: House Home Access: Stairs to enter Technical brewer of Steps: 1 Home Layout: One level Bathroom Shower/Tub: Chiropodist: Standard  Lives With: Family Prior Function Level of Independence: Independent with basic ADLs, Independent with homemaking with ambulation, Independent with gait, Independent with transfers  Able to Take Stairs?: Yes Driving:  Yes Vocation: Retired Surveyor, mining Baseline Vision/History: 1 Wears glasses Ability to See in Adequate Light: 0 Adequate Patient Visual Report: No change from baseline Vision Assessment?: No apparent visual deficits Perception  Perception: Within Functional Limits Praxis Praxis:  Intact Cognition Overall Cognitive Status: History of cognitive impairments - at baseline (pt states that she has had difficulty with word recall for a long time, but was functional at home doing all the medical and financial paperwork for herself and dtr and granddtr) Arousal/Alertness: Awake/alert Orientation Level: Person;Place;Situation Person: Oriented Place: Oriented Situation: Oriented Year: 2023 Month: February Day of Week: Correct Immediate Memory Recall: Sock;Blue;Bed Memory Recall Sock: Without Cue Memory Recall Blue: Without Cue Memory Recall Bed: Without Cue Sensation Sensation Light Touch: Impaired by gross assessment (decreased in B hands (premorbid)) Motor  Motor Motor - Skilled Clinical Observations: hx of balance disorder and tremors, but not apparent during eval and pt stated she is much better now that she is off of some medications.  Trunk/Postural Assessment  Postural Control Postural Control: Within Functional Limits  Balance Static Standing Balance Static Standing - Level of Assistance: 7: Independent Dynamic Standing Balance Dynamic Standing - Level of Assistance: 5: Stand by assistance Extremity/Trunk Assessment RUE Assessment General Strength Comments: 4-/5 - generalized pain with movement post T7 fracture LUE Assessment General Strength Comments: 4-/5 - generalized pain with movement post T7 fracture  Care Tool Care Tool Self Care Eating   Eating Assist Level: Independent    Oral Care    Oral Care Assist Level: Set up assist    Bathing   Body parts bathed by patient: Right arm;Left arm;Chest;Abdomen;Front perineal area;Buttocks;Face;Left lower leg;Right lower leg;Left upper leg;Right upper leg     Assist Level: Supervision/Verbal cueing    Upper Body Dressing(including orthotics)   What is the patient wearing?: Pull over shirt;Orthosis;Bra   Assist Level: Set up assist    Lower Body Dressing (excluding footwear)   What is the patient  wearing?: Underwear/pull up;Pants Assist for lower body dressing: Supervision/Verbal cueing    Putting on/Taking off footwear   What is the patient wearing?: Socks;Non-skid slipper socks Assist for footwear: Supervision/Verbal cueing       Care Tool Toileting Toileting activity   Assist for toileting: Supervision/Verbal cueing     Care Tool Bed Mobility Roll left and right activity        Sit to lying activity        Lying to sitting on side of bed activity   Lying to sitting on side of bed assist level: the ability to move from lying on the back to sitting on the side of the bed with no back support.: Supervision/Verbal cueing     Care Tool Transfers Sit to stand transfer   Sit to stand assist level: Supervision/Verbal cueing    Chair/bed transfer   Chair/bed transfer assist level: Supervision/Verbal cueing     Toilet transfer   Assist Level: Supervision/Verbal cueing     Care Tool Cognition  Expression of Ideas and Wants Expression of Ideas and Wants: 4. Without difficulty (complex and basic) - expresses complex messages without difficulty and with speech that is clear and easy to understand  Understanding Verbal and Non-Verbal Content Understanding Verbal and Non-Verbal Content: 4. Understands (complex and basic) - clear comprehension without cues or repetitions   Memory/Recall Ability Memory/Recall Ability : Current season;Location of own room;Staff names and faces;That he or she is in a hospital/hospital unit   Refer to Care Plan for [place] [facility]  SHORT TERM GOAL WEEK 1 OT Short Term Goal 1 (Week 1): STGs = LTGs  Recommendations for other services: None    Skilled Therapeutic Intervention ADL ADL Eating: Independent Grooming: Setup Upper Body Bathing: Setup Where Assessed-Upper Body Bathing: Shower Lower Body Bathing: Supervision/safety Where Assessed-Lower Body Bathing: Shower Upper Body Dressing: Setup Where Assessed-Upper Body Dressing: Edge of  bed Lower Body Dressing: Supervision/safety Where Assessed-Lower Body Dressing: Edge of bed Toileting: Supervision/safety Where Assessed-Toileting: Glass blower/designer: Close supervision Toilet Transfer Method: Counselling psychologist: Ambulance person Transfer: Close supervison Clinical cytogeneticist Method: Optometrist: Primary school teacher Sit to Stand: Supervision/Verbal cueing Stand to Sit: Supervision/Verbal cueing  Pt seen for initial evaluation and ADL training with a focus on compensatory strategies to follow back precautions. Pt did recall her back precautions and stated she has been allowed to ambulate to the bathroom without her brace. The orders stated TLSO can be removed for shower.  Tried to have pt put brace on to walk to the bathroom but pt insisted she has already done this numerous times on the other unit without the brace.  Pt stated "I have not had a shower in 6 days, I want one today". Pt able to sit to EOB with S and stand with S with RW then ambulated to bathroom with S.  She sat to toilet and voided then transferred into shower.  She did well following back precautions by crossing her legs to doff socks, wash feet, etc.   Pt ambulated back to bed to dress with S. She stated her TLSO is very uncomfortable and she really hates wearing it. Had her try it on and it was not fitting well. Could not figure out how to adjust straps.  Informed her PT who will see her this afternoon.   Reviewed role of OT, Pt's goals ("to get out of hospital fast!"), OT POC, ELOS.    Pt resting EOB with RN talking with her.  Bed alarm set.    Discharge Criteria: Patient will be discharged from OT if patient refuses treatment 3 consecutive times without medical reason, if treatment goals not met, if there is a change in medical status, if patient makes no progress towards goals or if patient is discharged from hospital.  The above  assessment, treatment plan, treatment alternatives and goals were discussed and mutually agreed upon: by patient  [facility] 02/08/2022, 12:04 PM

## 2022-02-08 NOTE — Progress Notes (Signed)
Patient ID: VERNECIA UMBLE, female   DOB: 1951-03-05, 71 y.o.   MRN: [id number] Met with the patient to introduce self, review rehab process and plan of care. Discussed medications, skin care and dietary modification recommendations. Patient noted history of falls before she took herself off amitriptyline-prescribed for migraines. Also working with MD on sleep aides as she has trouble sleeping but does not have good results with multi meds including trazodone or melatonin. Nausea noted with pain in scapular area that radiates down left arm. K-pad requested and pain medication administered. Patient asking about Repatha and Bumex; nurse following up with MD/PA. Continue to follow along to discharge to address educational needs and collaborate with the team to facilitate preparation for discharge home with grand-daughter assisting. Margarito Liner, RN

## 2022-02-08 NOTE — Progress Notes (Signed)
Inpatient Rehabilitation  Patient information reviewed and entered into eRehab system by Auriella Wieand Chelsey Kimberley, OTR/L.   Information including medical coding, functional ability and quality indicators will be reviewed and updated through discharge.    

## 2022-02-08 NOTE — Progress Notes (Signed)
PROGRESS NOTE   Subjective/Complaints:  Patient rates pain 7/10 this AM- just got pain medw- No BM in 6 days, but has been fighting diarrhea- severe- for 3 years, so scared of bowel meds.   Thinks IV needs to be removed- not being used.  Willing ot try prune juice before sorbitol.   ROS:  Pt denies SOB, abd pain, CP, N/V/C/D, and vision changes  Objective:   No results found. Recent Labs    02/08/22 0621  WBC 4.0  HGB 10.5*  HCT 31.0*  PLT 268   Recent Labs    02/07/22 1521 02/08/22 0621  NA  --  138  K  --  4.1  CL  --  103  CO2  --  27  GLUCOSE  --  164*  BUN  --  29*  CREATININE 1.11* 0.99  CALCIUM  --  9.0    Intake/Output Summary (Last 24 hours) at 02/08/2022 1044 Last data filed at 02/07/2022 1900 Gross per 24 hour  Intake 1945 ml  Output --  Net 1945 ml        Physical Exam: Vital Signs Blood pressure 140/61, pulse 91, temperature 98.1 F (36.7 C), resp. rate 16, SpO2 97 %.    General: awake, alert, appropriate, NAD HENT: conjugate gaze; oropharynx moist- raccoon eye on R with laceration CV: regular rate; no JVD Pulmonary: CTA B/L; no W/R/R- good air movement GI: soft, NT, ND, (+)BS Psychiatric: appropriate Neurological: Ox3 Skin:    Comments: Right forehead/peri-orbital ecchymosis.3 cm hematoma. 1 cm laceration appears to be closed with Monocryl or vicryl (absorbable) suture.  Neurological:     General: No focal deficit present.     Mental Status: She is alert and oriented to person, place, and time.     Comments: Mild tremors of both hands, worse with intention  Assessment/Plan: 1. Functional deficits which require 3+ hours per day of interdisciplinary therapy in a comprehensive inpatient rehab setting. Physiatrist is providing close team supervision and 24 hour management of active medical problems listed below. Physiatrist and rehab team continue to assess barriers to  discharge/monitor patient progress toward functional and medical goals  Care Tool:  Bathing              Bathing assist       Upper Body Dressing/Undressing Upper body dressing        Upper body assist      Lower Body Dressing/Undressing Lower body dressing            Lower body assist       Toileting Toileting    Toileting assist Assist for toileting: Contact Guard/Touching assist     Transfers Chair/bed transfer  Transfers assist           Locomotion Ambulation   Ambulation assist              Walk 10 feet activity   Assist           Walk 50 feet activity   Assist           Walk 150 feet activity   Assist           Walk  10 feet on uneven surface  activity   Assist           Wheelchair     Assist               Wheelchair 50 feet with 2 turns activity    Assist            Wheelchair 150 feet activity     Assist          Blood pressure 140/61, pulse 91, temperature 98.1 F (36.7 C), resp. rate 16, SpO2 97 %.    Medical Problem List and Plan: 1. Functional deficits secondary to T7 compression fracture status post fall             -patient may shower             -ELOS/Goals: 7-10 days S             -Admit to CIR  First day of evaluations- CIR- PT and OT 2.  Antithrombotics: -DVT/anticoagulation:  Pharmaceutical: Lovenox             -antiplatelet therapy: Aspirin 325 mg daily 3. Pain Management: Neurontin 100 mg 3 times daily, Robaxin, oxycodone as needed, lidocaine patch as needed  2/23- pain adequate/tolerable with current regimen- con't meds 4. Mood: LCSW to evaluate and provide emotional support             --Continue Wellbutrin 300 mg daily (sees therapist for possible depression)             -antipsychotic agents: Not applicable 5. Neuropsych: This patient is capable of making decisions on her own behalf. 6. Skin/Wound Care: Routine skin care checks             --  forehead laceration, hematoma: Monitor. Sutures are absorable 7. Fluids/Electrolytes/Nutrition: Routine I's and O's and follow-up chemistries 8.  Diabetes mellitus: CBGs and sliding scale insulin, carb modified diet             -- Does not appear she was on home medications  2/23- CBGs 140s-200s- A1c 7.4-  -will have to see what can start 9.  Hypertension: On Bumex at home, currently held 10: Hyperlipidemia: On Repatha at home q 14 days 11: Insomnia: melatonin not effective 12: Pernicious anemia: Normal H&H and normal red blood cell indices. Continue B 12 injection monthly 13: Hypothyroidism: Continue Synthroid 50 mcg every morning 14: Moderate malnutrition secondary to chronic illness: Follow-up albumin, total protein in tomorrow morning's labs  2/23- Albumin 2.7- not bad- but was ~ 4.0- total protein also droppe-d will d/w pt.  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 Constipation in setting of chronic diarrhea  2/23- LBM 6 days ago- will try prune juice, but if not, will order sorbitol at 5pm if no BM   I spent a total of 36   minutes on total care today- >50% coordination of care- due to d/w nursing and prolonged d/w pt- also d/discussed with admitting doctor  LOS: 1 days A FACE TO FACE EVALUATION WAS PERFORMED  Iyana Topor 02/08/2022, 10:44 AM
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
## 2022-02-08 NOTE — Plan of Care (Deleted)
°  Problem: RH Problem Solving Goal: LTG Patient will demonstrate problem solving for (SLP) Description: LTG:  Patient will demonstrate problem solving for basic/complex daily situations with cues  (SLP) Flowsheets (Taken 02/08/2022 1245) LTG: Patient will demonstrate problem solving for (SLP): Complex daily situations LTG Patient will demonstrate problem solving for: Modified Independent   Problem: RH Memory Goal: LTG Patient will use memory compensatory aids to (SLP) Description: LTG:  Patient will use memory compensatory aids to recall biographical/new, daily complex information with cues (SLP) Flowsheets (Taken 02/08/2022 1245) LTG: Patient will use memory compensatory aids to (SLP): Modified Independent   Problem: RH Attention Goal: LTG Patient will demonstrate this level of attention during functional activites (SLP) Description: LTG:  Patient will will demonstrate this level of attention during functional activites (SLP) Flowsheets (Taken 02/08/2022 1245) Patient will demonstrate during cognitive/linguistic activities the attention type of: Alternating LTG: Patient will demonstrate this level of attention during cognitive/linguistic activities with assistance of (SLP): Modified Independent

## 2022-02-08 NOTE — Progress Notes (Signed)
Inpatient Rehabilitation Center Individual Statement of Services  Patient Name:  [name]  Date:  02/08/2022  Welcome to the [facility].  Our goal is to provide you with an individualized program based on your diagnosis and situation, designed to meet your specific needs.  With this comprehensive rehabilitation program, you will be expected to participate in at least 3 hours of rehabilitation therapies Monday-Friday, with modified therapy programming on the weekends.  Your rehabilitation program will include the following services:  Physical Therapy (PT), Occupational Therapy (OT), Speech Therapy (ST), 24 hour per day rehabilitation nursing, Therapeutic Recreaction (TR), Care Coordinator, Rehabilitation Medicine, Nutrition Services, and Pharmacy Services  Weekly team conferences will be held on Wednesday to discuss your progress.  Your Inpatient Rehabilitation Care Coordinator will talk with you frequently to get your input and to update you on team discussions.  Team conferences with you and your family in attendance may also be held.  Expected length of stay: 7 days  Overall anticipated outcome: independent with device  Depending on your progress and recovery, your program may change. Your Inpatient Rehabilitation Care Coordinator will coordinate services and will keep you informed of any changes. Your Inpatient Rehabilitation Care Coordinator's name and contact numbers are listed  below.  The following services may also be recommended but are not provided by the [facility] [place] will be made to provide these services after discharge if needed.  Arrangements include referral to agencies that provide these services.  Your insurance has been verified to be:  [employer]-Medicare Your primary doctor is:  No PCP  Pertinent information will be shared  with your doctor and your insurance company.  Inpatient Rehabilitation Care Coordinator:  Ovidio Kin, [place] or [phone]  Information discussed with and copy given to patient by: Elease Hashimoto, 02/08/2022, 1:16 PM

## 2022-02-08 NOTE — Evaluation (Addendum)
Physical Therapy Assessment and Plan  Patient Details  Name: [name] MRN: [id number] Date of Birth: 22-Mar-1951  PT Diagnosis: Abnormal posture, Abnormality of gait, and Muscle weakness Rehab Potential: Good ELOS: 3-5 days   Today's Date: 02/08/2022 PT Individual Time: 1300-1410 PT Individual Time Calculation (min): 70 min    Hospital Problem: Principal Problem:   T7 vertebral fracture ([place])   Past Medical History:  Past Medical History:  Diagnosis Date   Allergy    Anemia    Arthritis    Asthma    Dianosed years ago-no meds at this time   Cancer [facility]) 2021   endometrial    Cataract    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 name of MD    Dementia ([place])    Diabetes mellitus    type 2    Diabetes mellitus without complication ([place])    Dyslipidemia    Dyspnea    with exertion    Dysrhythmia    "skips beats"   Endometrial cancer ([place]) 2021   Family history of adverse reaction to anesthesia    son stopped breathing during surgery , mother slow to wake up    Fibromyalgia 1980's   Generalized anxiety disorder    GERD (gastroesophageal reflux disease)    occ- will use Tums and Prolosec  prn    Heart murmur    Hyperlipemia    Hypertension    not on blood pressure meds due to allergies - last dose of meds- months ago    Hypertension    Hypothyroidism    Hypothyroidism (acquired)    Irritable bowel syndrome 1980's   Major depressive disorder    Migraine headaches 1980's   On meds-well controlled   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   Morbid obesity ([place])    Pernicious anemia    Pneumonia 2009   Several times over the past several years   PONV (postoperative nausea and vomiting)    was told by neurologist due to calcificaton in brain not to be put to sleep, N/V during Op and Recovery    Recurrent upper respiratory  infection ([place])    Sleep apnea    no cpap   Subarachnoid hemorrhage ([place]) 2010   Vaginal polyp    benign per pt   Past Surgical History:  Past Surgical History:  Procedure Laterality Date   CHOLECYSTECTOMY  1983   COLONOSCOPY  2008   DB   DILATION AND CURETTAGE OF UTERUS  1972   HYSTEROSCOPY WITH D & C  06/29/2011   Procedure: DILATATION AND CURETTAGE (D&C) /HYSTEROSCOPY;  Surgeon: Donnamae Jude, MD;  Location: [facility];  Service: Gynecology;  Laterality: N/A;   ROBOTIC ASSISTED TOTAL HYSTERECTOMY WITH BILATERAL SALPINGO OOPHERECTOMY N/A 10/27/2020   Procedure: XI ROBOTIC ASSISTED TOTAL HYSTERECTOMY WITH BILATERAL SALPINGO OOPHORECTOMY;  Surgeon: Everitt Amber, MD;  Location: [facility];  Service: Gynecology;  Laterality: N/A;   SENTINEL NODE BIOPSY N/A 10/27/2020   Procedure: SENTINEL NODE BIOPSY;  Surgeon: Everitt Amber, MD;  Location: [facility];  Service: Gynecology;  Laterality: N/A;    Assessment & Plan Clinical Impression: Patient is a 71 y.o. year old female with  a history of chronic dizziness and tremors who fell at home, unwitnessed.  She presented to [facility] on 01/30/2022 complaining of diffuse pain particularly of bilateral upper  extremities.  She sustained a right frontal laceration with hematoma.  CT scan of head and C-spine were negative.  She complained of significant bilateral upper extremity pain and neurosurgery was consulted. Dr. Kathyrn Sheriff recommended transfer to [facility] for [facility].  This was significant for T7 compression fracture and TLSO was fitted.  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  Possible scapholunate ligamentous injury of the left hand, negative imaging of right hand.  She has remained hemodynamically stable. The patient requires inpatient medicine and rehabilitation evaluations and services for ongoing dysfunction secondary to fall resulting in T7 compression fracture, soft tissue injury to left hand.   History  of chronic diarrhea, s/p colonoscopy with pre-cancerous polyp. She is very hesitant about using any laxative and needs to be mild formula.   History of chronic bilateral hand tremors/weakness and falls and Fahr's syndrome has been considered. She has referral to [facility] movement disorder specialist. Chronic dizziness whick improved once this was weaned off. She was on it for migraines in the distant past.   Not on DM medications due to multiple allergies. Evaluated for possible stroke in 2021 and had negative work-up except asymptomatic carotid disease.   Lives at times with her daughter and granddaughter. Daughter Rosalee Kaufman has schizo-affective disorder. Strained relationships with both sons due to their alcoholism.    Patient currently requires supervision with mobility secondary to muscle weakness, decreased cardiorespiratoy endurance, decreased awareness, decreased problem solving, decreased safety awareness, and decreased memory, and decreased standing balance, decreased postural control, decreased balance strategies, and difficulty maintaining precautions.  Prior to hospitalization, patient was independent  with mobility and lived with Family in a House home.  Home access is 1Level entry.  Patient will benefit from skilled PT intervention to maximize safe functional mobility, minimize fall risk, and decrease caregiver burden for planned discharge home with intermittent assist.  Anticipate patient will benefit from follow up OP at discharge.  PT - End of Session Activity Tolerance: Tolerates 30+ min activity with multiple rests Endurance Deficit: Yes PT Assessment Rehab Potential (ACUTE/IP ONLY): Good PT Barriers to Discharge: Decreased caregiver support PT Patient demonstrates impairments in the following area(s): Balance;Endurance;Motor;Pain;Safety;Skin Integrity PT Transfers Functional Problem(s): Bed Mobility;Bed to Chair;Car PT Locomotion Functional Problem(s):  Ambulation;Stairs PT Plan PT Intensity: Minimum of 1-2 x/day ,45 to 90 minutes PT Frequency: 5 out of 7 days PT Duration Estimated Length of Stay: 3-5 days PT Treatment/Interventions: Ambulation/gait training;Cognitive remediation/compensation;Discharge planning;DME/adaptive equipment instruction;Functional mobility training;Pain management;Psychosocial support;Splinting/orthotics;Therapeutic Activities;UE/LE Strength taining/ROM;Visual/perceptual remediation/compensation;Balance/vestibular training;Community reintegration;Disease management/prevention;Functional electrical stimulation;Neuromuscular re-education;Patient/family education;Skin care/wound management;Stair training;Therapeutic Exercise;UE/LE Coordination activities;Wheelchair propulsion/positioning PT Transfers Anticipated Outcome(s): ModI PT Locomotion Anticipated Outcome(s): ModI PT Recommendation Follow Up Recommendations: Outpatient PT;Home health PT Patient destination: Home Equipment Recommended: Rolling walker with 5" wheels   PT Evaluation Precautions/Restrictions Precautions Precautions: Back;Fall Precaution Booklet Issued: No Required Braces or Orthoses: Splint/Cast;Spinal Brace Spinal Brace: Thoracolumbosacral orthotic Restrictions Weight Bearing Restrictions: No General   Vital Signs  Pain Pain Assessment Pain Scale: 0-10 Pain Score: 0-No pain Pain Type: Acute pain Pain Location: Shoulder Pain Orientation: Left Pain Radiating Towards: mid-lowe back Pain Descriptors / Indicators: Aching Pain Frequency: Constant Pain Onset: On-going Pain Intervention(s): Medication (See eMAR) Pain Interference Pain Interference Pain Effect on Sleep: 3. Frequently Pain Interference with Therapy Activities: 2. Occasionally Pain Interference with Day-to-Day Activities: 3. Frequently Home Living/Prior Functioning Home Living Living Arrangements: Other relatives Available Help at Discharge: Family;Available  PRN/intermittently Type of Home: House Home Access: Level  entry Entrance Stairs-Number of Steps: 1 Home Layout: One level Bathroom Shower/Tub: Chiropodist: Standard Bathroom Accessibility: Yes  Lives With: Family Prior Function Level of Independence: Independent with basic ADLs;Independent with homemaking with ambulation;Independent with gait;Independent with transfers  Able to Take Stairs?: Yes Driving: Yes Vocation: Retired Vision/Perception  Vision - History Ability to See in Adequate Light: 0 Adequate Perception Perception: Within Functional Limits Praxis Praxis: Intact  Cognition Overall Cognitive Status: History of cognitive impairments - at baseline Arousal/Alertness: Awake/alert Orientation Level: Oriented X4 Year: 2023 Month: February Day of Week: Correct Attention: Alternating;Selective;Focused Focused Attention: Appears intact Sustained Attention: Appears intact Selective Attention: Appears intact Alternating Attention: Impaired Alternating Attention Impairment: Functional complex;Verbal complex Memory: Impaired Memory Impairment: Storage deficit;Retrieval deficit Immediate Memory Recall: Sock;Blue;Bed Memory Recall Sock: Without Cue Memory Recall Blue: Without Cue Memory Recall Bed: Without Cue Awareness: Impaired Problem Solving: Impaired Problem Solving Impairment: Verbal complex Safety/Judgment: Appears intact Sensation Sensation Light Touch: Impaired by gross assessment (h/o B hand N/T) Hot/Cold: Not tested Proprioception: Appears Intact Stereognosis: Appears Intact Coordination Gross Motor Movements are Fluid and Coordinated: No Fine Motor Movements are Fluid and Coordinated: No Finger Nose Finger Test: WFL Heel Shin Test: [facility] Motor  Motor Motor: Abnormal postural alignment and control Motor - Skilled Clinical Observations: hx of balance disorder and tremors, but not apparent during eval and pt stated she is much better  now that she is off of some medications.   Trunk/Postural Assessment  Cervical Assessment Cervical Assessment: Within Functional Limits Thoracic Assessment Thoracic Assessment: Exceptions to [facility] Lumbar Assessment Lumbar Assessment: Exceptions to [facility] Postural Control Postural Control: Within Functional Limits  Balance Balance Balance Assessed: Yes Static Sitting Balance Static Sitting - Balance Support: Feet supported Static Sitting - Level of Assistance: 7: Independent Dynamic Sitting Balance Dynamic Sitting - Balance Support: Feet supported;During functional activity Dynamic Sitting - Level of Assistance: 6: Modified independent (Device/Increase time) Static Standing Balance Static Standing - Balance Support: During functional activity;No upper extremity supported Static Standing - Level of Assistance: 5: Stand by assistance Dynamic Standing Balance Dynamic Standing - Balance Support: During functional activity Dynamic Standing - Level of Assistance: 5: Stand by assistance Extremity Assessment  RUE Assessment General Strength Comments: 4-/5 - generalized pain with movement post T7 fracture LUE Assessment General Strength Comments: 4-/5 - generalized pain with movement post T7 fracture RLE Assessment RLE Assessment: Within Functional Limits LLE Assessment LLE Assessment: Within Functional Limits  Care Tool Care Tool Bed Mobility Roll left and right activity   Roll left and right assist level: Contact Guard/Touching assist    Sit to lying activity   Sit to lying assist level: Contact Guard/Touching assist    Lying to sitting on side of bed activity   Lying to sitting on side of bed assist level: the ability to move from lying on the back to sitting on the side of the bed with no back support.: Supervision/Verbal cueing     Care Tool Transfers Sit to stand transfer   Sit to stand assist level: Supervision/Verbal cueing    Chair/bed transfer   Chair/bed transfer  assist level: Supervision/Verbal cueing     Toilet transfer   Assist Level: Supervision/Verbal cueing    Car transfer Car transfer activity did not occur: Safety/medical concerns        Care Tool Locomotion Ambulation   Assist level: Contact Guard/Touching assist Assistive device: No Device Max distance: 300  Walk 10 feet activity   Assist level: Supervision/Verbal cueing Assistive device:  No Device   Walk 50 feet with 2 turns activity   Assist level: Contact Guard/Touching assist Assistive device: No Device  Walk 150 feet activity   Assist level: Contact Guard/Touching assist Assistive device: No Device  Walk 10 feet on uneven surfaces activity Walk 10 feet on uneven surfaces activity did not occur: Safety/medical concerns      Stairs   Assist level: Contact Guard/Touching assist Stairs assistive device: 2 hand rails Max number of stairs: 12  Walk up/down 1 step activity   Walk up/down 1 step (curb) assist level: Contact Guard/Touching assist Walk up/down 1 step or curb assistive device: 2 hand rails  Walk up/down 4 steps activity   Walk up/down 4 steps assist level: Contact Guard/Touching assist Walk up/down 4 steps assistive device: 2 hand rails  Walk up/down 12 steps activity   Walk up/down 12 steps assist level: Contact Guard/Touching assist Walk up/down 12 steps assistive device: 2 hand rails  Pick up small objects from floor   Pick up small object from the floor assist level: Supervision/Verbal cueing Pick up small object from the floor assistive device: reacher  Wheelchair Is the patient using a wheelchair?: No          Wheel 50 feet with 2 turns activity      Wheel 150 feet activity        Refer to Care Plan for Long Term Goals  SHORT TERM GOAL WEEK 1 PT Short Term Goal 1 (Week 1): STG=LTG based on ELOS  Recommendations for other services: None   Skilled Therapeutic Intervention Mobility Bed Mobility Bed Mobility: Rolling Right;Rolling  Left;Supine to Sit;Sit to Supine Rolling Right: Contact Guard/Touching assist Rolling Left: Contact Guard/Touching assist Supine to Sit: Contact Guard/Touching assist Sit to Supine: Contact Guard/Touching assist Transfers Transfers: Stand to Sit;Sit to Stand;Stand Pivot Transfers Sit to Stand: Supervision/Verbal cueing Stand to Sit: Supervision/Verbal cueing Stand Pivot Transfers: Supervision/Verbal cueing Transfer (Assistive device): None Locomotion  Gait Ambulation: Yes Gait Assistance: Supervision/Verbal cueing;Contact Guard/Touching assist Gait Distance (Feet): 300 Feet Assistive device: None Gait Gait: Yes Gait Pattern: Impaired Gait Pattern: Step-through pattern;Decreased step length - right;Decreased step length - left;Trunk flexed Gait velocity: reduced Stairs / Additional Locomotion Stairs: Yes Stairs Assistance: Contact Guard/Touching assist Stair Management Technique: Two rails Number of Stairs: 12 Height of Stairs: 6 Curb: Contact Guard/Touching assist Wheelchair Mobility Wheelchair Mobility: No   Discharge Criteria: Patient will be discharged from PT if patient refuses treatment 3 consecutive times without medical reason, if treatment goals not met, if there is a change in medical status, if patient makes no progress towards goals or if patient is discharged from hospital.  The above assessment, treatment plan, treatment alternatives and goals were discussed and mutually agreed upon: by patient  [name] 02/08/2022, 2:17 PM

## 2022-02-08 NOTE — Plan of Care (Signed)
°  Problem: RH Balance Goal: LTG Patient will maintain dynamic sitting balance (PT) Description: LTG:  Patient will maintain dynamic sitting balance with assistance during mobility activities (PT) Flowsheets (Taken 02/08/2022 1457) LTG: Pt will maintain dynamic sitting balance during mobility activities with:: Independent Goal: LTG Patient will maintain dynamic standing balance (PT) Description: LTG:  Patient will maintain dynamic standing balance with assistance during mobility activities (PT) Flowsheets (Taken 02/08/2022 1457) LTG: Pt will maintain dynamic standing balance during mobility activities with:: Independent with assistive device    Problem: Sit to Stand Goal: LTG:  Patient will perform sit to stand with assistance level (PT) Description: LTG:  Patient will perform sit to stand with assistance level (PT) Flowsheets (Taken 02/08/2022 1457) LTG: PT will perform sit to stand in preparation for functional mobility with assistance level: Independent with assistive device   Problem: RH Bed Mobility Goal: LTG Patient will perform bed mobility with assist (PT) Description: LTG: Patient will perform bed mobility with assistance, with/without cues (PT). Flowsheets (Taken 02/08/2022 1457) LTG: Pt will perform bed mobility with assistance level of: Independent with assistive device    Problem: RH Bed to Chair Transfers Goal: LTG Patient will perform bed/chair transfers w/assist (PT) Description: LTG: Patient will perform bed to chair transfers with assistance (PT). Flowsheets (Taken 02/08/2022 1457) LTG: Pt will perform Bed to Chair Transfers with assistance level: Independent with assistive device    Problem: RH Car Transfers Goal: LTG Patient will perform car transfers with assist (PT) Description: LTG: Patient will perform car transfers with assistance (PT). Flowsheets (Taken 02/08/2022 1457) LTG: Pt will perform car transfers with assist:: Independent with assistive device    Problem:  RH Ambulation Goal: LTG Patient will ambulate in controlled environment (PT) Description: LTG: Patient will ambulate in a controlled environment, # of feet with assistance (PT). Flowsheets (Taken 02/08/2022 1457) LTG: Pt will ambulate in controlled environ  assist needed:: Independent with assistive device LTG: Ambulation distance in controlled environment: 50 Goal: LTG Patient will ambulate in home environment (PT) Description: LTG: Patient will ambulate in home environment, # of feet with assistance (PT). Flowsheets (Taken 02/08/2022 1457) LTG: Pt will ambulate in home environ  assist needed:: Independent with assistive device LTG: Ambulation distance in home environment: 150 Goal: LTG Patient will ambulate in community environment (PT) Description: LTG: Patient will ambulate in community environment, # of feet with assistance (PT). Flowsheets (Taken 02/08/2022 1457) LTG: Pt will ambulate in community environ  assist needed:: Supervision/Verbal cueing LTG: Ambulation distance in community environment: 350   Problem: RH Stairs Goal: LTG Patient will ambulate up and down stairs w/assist (PT) Description: LTG: Patient will ambulate up and down # of stairs with assistance (PT) Flowsheets (Taken 02/08/2022 1457) LTG: Pt will ambulate up/down stairs assist needed:: Supervision/Verbal cueing LTG: Pt will  ambulate up and down number of stairs: 12 Note: HR per home set up

## 2022-02-09 LAB — GLUCOSE, CAPILLARY
Glucose-Capillary: 177 mg/dL — ABNORMAL HIGH (ref 70–99)
Glucose-Capillary: 232 mg/dL — ABNORMAL HIGH (ref 70–99)
Glucose-Capillary: 141 mg/dL — ABNORMAL HIGH (ref 70–99)
Glucose-Capillary: 155 mg/dL — ABNORMAL HIGH (ref 70–99)

## 2022-02-09 MED ORDER — SORBITOL 70 % SOLN
15.0000 mL | Freq: Once | Status: DC
  Filled 2022-02-09: qty 30

## 2022-02-09 MED ORDER — OXYCODONE HCL 5 MG PO TABS
2.5000 mg | ORAL_TABLET | ORAL | Status: DC | PRN
  Administered 2022-02-09 – 2022-02-12 (×8): 5 mg via ORAL
  Filled 2022-02-09 (×8): qty 1

## 2022-02-09 MED ORDER — ENSURE MAX PROTEIN PO LIQD
11.0000 [oz_av] | Freq: Two times a day (BID) | ORAL | Status: DC
  Administered 2022-02-10 – 2022-02-13 (×6): 11 [oz_av] via ORAL
  Filled 2022-02-09 (×9): qty 330

## 2022-02-09 MED ORDER — JUVEN PO PACK
1.0000 | PACK | Freq: Two times a day (BID) | ORAL | Status: DC
  Administered 2022-02-10 – 2022-02-12 (×2): 1 via ORAL
  Filled 2022-02-09 (×3): qty 1

## 2022-02-09 NOTE — Progress Notes (Signed)
Occupational Therapy Session Note  Patient Details  Name: [name] MRN: [id number] Date of Birth: Oct 15, 1951  Today's Date: 02/09/2022 OT Individual Time: 1000-1100 OT Individual Time Calculation (min): 60 min    Short Term Goals: Week 1:  OT Short Term Goal 1 (Week 1): STGs = LTGs  Skilled Therapeutic Interventions/Progress Updates:    Pt received in bed. Pt asking for a shower. She had low blood pressure this am with PT.  Sitting EOB her BP was WNL 128/78.  Instead of having pt walk to bathroom to shower (as she said she was having dizziness and increased tremors), had pt use w/c to stand pivot from bed and then to tub bench.  In shower pt bathed with supervision, standing with S as needed to do lower body.  Pt transferred back to wc to don UB  clothing and underwear with pad.  BP only 103/71. Provided with ice water to drink. Had pt don thigh high TEDS with A.   She then donned her pants and shoes.  Pt needed to move more slowly today with increased rest breaks.    Pt taken to day room for next OT group.   Therapy Documentation Precautions:  Precautions Precautions: Back, Fall Precaution Booklet Issued: No Required Braces or Orthoses: Splint/Cast, Spinal Brace Spinal Brace: Thoracolumbosacral orthotic Restrictions Weight Bearing Restrictions: No  Pain: Pain Assessment Pain Scale: 0-10 Pain Score: 9  Pain Type: Acute pain Pain Location: Shoulder Pain Descriptors / Indicators: Aching Pain Intervention(s): Medication (See eMAR) ADL: ADL Eating: Independent Grooming: Setup Upper Body Bathing: Setup Where Assessed-Upper Body Bathing: Shower Lower Body Bathing: Supervision/safety Where Assessed-Lower Body Bathing: Shower Upper Body Dressing: Setup Where Assessed-Upper Body Dressing: Edge of bed Lower Body Dressing: Supervision/safety Where Assessed-Lower Body Dressing: Edge of bed Toileting: Supervision/safety Where Assessed-Toileting: Contractor: Close supervision Toilet Transfer Method: Counselling psychologist: Ambulance person Transfer: Close supervison Clinical cytogeneticist Method: Optometrist: Radio broadcast assistant   Therapy/Group: Individual Therapy  Kerr 02/09/2022, 11:25 AM

## 2022-02-09 NOTE — Progress Notes (Signed)
PROGRESS NOTE   Subjective/Complaints:  Pt reports had a small BM last night- but nothing to clean her out- doesn't remember if took sorbitol. Was written prn- didn't take per chart-  Taking prune juice.   Scared due to hx of severe dirrhea where cannot control it.  When eats, her nose runs.   Using kpad for pain- pain adequate control.   ROS:  Pt denies SOB, abd pain, CP, N/V/(+)C/D, and vision changes   Objective:   No results found. Recent Labs    02/08/22 0621  WBC 4.0  HGB 10.5*  HCT 31.0*  PLT 268   Recent Labs    02/07/22 1521 02/08/22 0621  NA  --  138  K  --  4.1  CL  --  103  CO2  --  27  GLUCOSE  --  164*  BUN  --  29*  CREATININE 1.11* 0.99  CALCIUM  --  9.0    Intake/Output Summary (Last 24 hours) at 02/09/2022 1025 Last data filed at 02/09/2022 0700 Gross per 24 hour  Intake 501 ml  Output --  Net 501 ml        Physical Exam: Vital Signs Blood pressure 130/66, pulse (!) 101, temperature 98.1 F (36.7 C), resp. rate 17, SpO2 100 %.     General: awake, alert, appropriate, sitting EOBNAD HENT: conjugate gaze; oropharynx moist- nose running CV: borderline tachycardic rate; no JVD Pulmonary: CTA B/L; no W/R/R- good air movement GI: soft, NT, ND, (+)BS Psychiatric: appropriate Neurological: Ox3- slightly delayed responses- head bobbing some with tremors and hands tremoring- appears worse today  Skin:    Comments: Right forehead/peri-orbital ecchymosis.3 cm hematoma. 1 cm laceration appears to be closed with Monocryl or vicryl (absorbable) suture.  Neurological:     General: No focal deficit present.     Mental Status: She is alert and oriented to person, place, and time.     Comments: Mild tremors of both hands, worse with intention  Assessment/Plan: 1. Functional deficits which require 3+ hours per day of interdisciplinary therapy in a comprehensive inpatient rehab  setting. Physiatrist is providing close team supervision and 24 hour management of active medical problems listed below. Physiatrist and rehab team continue to assess barriers to discharge/monitor patient progress toward functional and medical goals  Care Tool:  Bathing    Body parts bathed by patient: Right arm, Left arm, Chest, Abdomen, Front perineal area, Buttocks, Face, Left lower leg, Right lower leg, Left upper leg, Right upper leg         Bathing assist Assist Level: Supervision/Verbal cueing     Upper Body Dressing/Undressing Upper body dressing   What is the patient wearing?: Pull over shirt, Orthosis, Bra    Upper body assist Assist Level: Set up assist    Lower Body Dressing/Undressing Lower body dressing      What is the patient wearing?: Underwear/pull up, Pants     Lower body assist Assist for lower body dressing: Supervision/Verbal cueing     Toileting Toileting    Toileting assist Assist for toileting: Contact Guard/Touching assist     Transfers Chair/bed transfer  Transfers assist     Chair/bed  transfer assist level: Supervision/Verbal cueing     Locomotion Ambulation   Ambulation assist      Assist level: Contact Guard/Touching assist Assistive device: No Device Max distance: 300   Walk 10 feet activity   Assist     Assist level: Supervision/Verbal cueing Assistive device: No Device   Walk 50 feet activity   Assist    Assist level: Contact Guard/Touching assist Assistive device: No Device    Walk 150 feet activity   Assist    Assist level: Contact Guard/Touching assist Assistive device: No Device    Walk 10 feet on uneven surface  activity   Assist Walk 10 feet on uneven surfaces activity did not occur: Safety/medical concerns         Wheelchair     Assist Is the patient using a wheelchair?: No             Wheelchair 50 feet with 2 turns activity    Assist            Wheelchair  150 feet activity     Assist          Blood pressure 130/66, pulse (!) 101, temperature 98.1 F (36.7 C), resp. rate 17, SpO2 100 %.    Medical Problem List and Plan: 1. Functional deficits secondary to T7 compression fracture status post fall             -patient may shower             -ELOS/Goals: 7-10 days S             -Admit to CIR  Con't CIR_ PT and OT 2.  Antithrombotics: -DVT/anticoagulation:  Pharmaceutical: Lovenox             -antiplatelet therapy: Aspirin 325 mg daily 3. Pain Management: Neurontin 100 mg 3 times daily, Robaxin, oxycodone as needed, lidocaine patch as needed  2/24- pain a little better with meds and kpad- con't regimen 4. Mood: LCSW to evaluate and provide emotional support             --Continue Wellbutrin 300 mg daily (sees therapist for possible depression)             -antipsychotic agents: Not applicable 5. Neuropsych: This patient is capable of making decisions on her own behalf. 6. Skin/Wound Care: Routine skin care checks             -- forehead laceration, hematoma: Monitor. Sutures are absorable 7. Fluids/Electrolytes/Nutrition: Routine I's and O's and follow-up chemistries 8.  Diabetes mellitus: CBGs and sliding scale insulin, carb modified diet             -- Does not appear she was on home medications  2/23- CBGs 140s-200s- A1c 7.4-  2/24- pt allergic to 31 meds- many of them DM meds- CBGs 160s-170s- doesn't want ot add meds right now- didn't take anything at home   9.  Hypertension: On Bumex at home, currently held 10: Hyperlipidemia: On Repatha at home q 14 days 11: Insomnia: melatonin not effective 12: Pernicious anemia: Normal H&H and normal red blood cell indices. Continue B 12 injection monthly 13: Hypothyroidism: Continue Synthroid 50 mcg every morning 14: Moderate malnutrition secondary to chronic illness: Follow-up albumin, total protein in tomorrow morning's labs  2/23- Albumin 2.7- not bad- but was ~ 4.0- total protein  also droppe-d will d/w pt.  15: Chronic upper extremity tremor 16: T7 fracture: wear TSLO brace 17: Left hand soft tissue injury: continue  to splint as needed 18: GERD: Continue Protonix 19. Constipation in setting of chronic diarrhea  2/23- LBM 6 days ago- will try prune juice, but if not, will order sorbitol at 5pm if no BM  2/24- Sorbitol 15cc ordered for 3pm if no BM   I spent a total of  36  minutes on total care today- >50% coordination of care- due to [place] and d/w pharmacy   LOS: 2 days A FACE TO FACE EVALUATION WAS PERFORMED  Tobyn Osgood 02/09/2022, 10:25 AM

## 2022-02-09 NOTE — Progress Notes (Signed)
Patient ID: [name], female   DOB: March 10, 1951, 71 y.o.   MRN: [id number]  Met with pt who is feeling better and feels doing better. Discussed equipment recommendations, she does not have a rolling walker so agreeable to this but wants to wait on the tub bench due to does not have the money at this time. Wants home health due to transportation issues. No pref.

## 2022-02-09 NOTE — Progress Notes (Signed)
Physical Therapy Session Note  Patient Details  Name: [name] MRN: [id number] Date of Birth: 07/22/1951  Today's Date: 02/09/2022 PT Individual Time: 1431-1500 PT Individual Time Calculation (min): 29 min   Short Term Goals: Week 1:  PT Short Term Goal 1 (Week 1): STG=LTG based on ELOS  Skilled Therapeutic Interventions/Progress Updates: Pt presents sitting in recliner and agreeable to therapy.  Pt transfers sit to stand w/ supervision and amb into hallway w/ RW and CGA to close supervision.  Pt amb approx. 28' and states increase in symptoms of dizziness, "going to pass out".  Pt returned to room and to recliner.  BP monitored at 131/67 in sitting w/ sx resolution, and then 108/60 w/ minimal sx and then marching in place w/ increased sx and BP of 108/52.  Pt sat and sx resolved.  Pt amb 100' w/ RW w/ minimal c/o BP sx.  BP checked at 116/57 when returned to sitting.  Spoke w/ nursing re: pt c/o and certainty that it is " a medicine that I take 3x/day, but should only get 2x/day"  Nursing to follow up w/ pt.  Chair alarm on and all needs in reach.     Therapy Documentation Precautions:  Precautions Precautions: Back, Fall Precaution Booklet Issued: No Required Braces or Orthoses: Splint/Cast, Spinal Brace Spinal Brace: Thoracolumbosacral orthotic Restrictions Weight Bearing Restrictions: No General:   Vital Signs: Therapy Vitals Temp: 97.9 F (36.6 C) Pulse Rate: 93 Resp: 17 BP: 110/65 Patient Position (if appropriate): Sitting Oxygen Therapy SpO2: 100 % O2 Device: Room Air Pain:0/10 Pain Assessment Pain Scale: 0-10 Pain Score: 5  Pain Type: Acute pain Pain Location: Shoulder Pain Descriptors / Indicators: Aching Pain Onset: On-going Pain Intervention(s): Medication (See eMAR) Mobility:      Therapy/Group: Individual Therapy  Ladoris Gene 02/09/2022, 3:02 PM

## 2022-02-09 NOTE — IPOC Note (Signed)
Overall Plan of Care [facility]) Patient Details Name: [name] MRN: [id number] DOB: 06/19/1951  Admitting Diagnosis: T7 vertebral fracture [facility])  Hospital Problems: Principal Problem:   T7 vertebral fracture ([place])     Functional Problem List: Nursing Bowel, Pain, Endurance, Medication Management, Safety  PT Balance, Endurance, Motor, Pain, Safety, Skin Integrity  OT Pain, Endurance  SLP Cognition  TR         Basic ADLs: OT Bathing, Dressing, Toileting     Advanced  ADLs: OT Light Housekeeping     Transfers: PT Bed Mobility, Bed to Chair, Teacher, early years/pre, Tub/Shower     Locomotion: PT Ambulation, Stairs     Additional Impairments: OT None  SLP Social Cognition   Memory, Attention, Problem Solving  TR      Anticipated Outcomes Item Anticipated Outcome  Self Feeding no goal, pt is independent  Swallowing      Basic self-care  Mod I  Toileting  Mod I   Bathroom Transfers Mod I  Bowel/Bladder  manage bowel w mod I assist  Transfers  ModI  Locomotion  ModI  Communication     Cognition  mod I  Pain  pain at or below level 4 with prns  Safety/Judgment  maintain safety w cues   Therapy Plan: PT Intensity: Minimum of 1-2 x/day ,45 to 90 minutes PT Frequency: 5 out of 7 days PT Duration Estimated Length of Stay: 3-5 days OT Intensity: Minimum of 1-2 x/day, 45 to 90 minutes OT Frequency: 5 out of 7 days OT Duration/Estimated Length of Stay: 7 days SLP Intensity: Minumum of 1-2 x/day, 30 to 90 minutes SLP Frequency: 3 to 5 out of 7 days SLP Duration/Estimated Length of Stay: 7 days   Due to the current state of emergency, patients may not be receiving their 3-hours of Medicare-mandated therapy.   Team Interventions: Nursing Interventions Bowel Management, Medication Management, Pain Management, Disease Management/Prevention, Patient/Family Education, Discharge Planning  PT interventions Ambulation/gait training, Cognitive  remediation/compensation, Discharge planning, DME/adaptive equipment instruction, Functional mobility training, Pain management, Psychosocial support, Splinting/orthotics, Therapeutic Activities, UE/LE Strength taining/ROM, Visual/perceptual remediation/compensation, Training and development officer, Community reintegration, Disease management/prevention, Functional electrical stimulation, Neuromuscular re-education, Patient/family education, Skin care/wound management, Stair training, Therapeutic Exercise, UE/LE Coordination activities, Wheelchair propulsion/positioning  OT Interventions Discharge planning, DME/adaptive equipment instruction, Functional mobility training, Patient/family education, Self Care/advanced ADL retraining, Therapeutic Activities, Therapeutic Exercise  SLP Interventions Cognitive remediation/compensation, Internal/external aids, Patient/family education, Therapeutic Activities, Functional tasks  TR Interventions    SW/CM Interventions Discharge Planning, Psychosocial Support, Patient/Family Education   Barriers to Discharge MD  Medical stability, Home enviroment access/loayout, Incontinence, Wound care, Lack of/limited family support, and Behavior  Nursing Lack of/limited family support 1 level 2 ste solo PTA; grand-daughter to assist at d/c  PT Decreased caregiver support    OT      SLP      SW Decreased caregiver support, Insurance underwriter for SNF coverage, Other (comments) Falling PTA before this fall   Team Discharge Planning: Destination: PT-Home ,OT- Home , SLP-Home Projected Follow-up: PT-Outpatient PT, Home health PT, OT-  Home health OT, SLP-Other (comment) (none anticipated) Projected Equipment Needs: PT-Rolling walker with 5" wheels, OT- Tub/shower bench, SLP-None recommended by SLP Equipment Details: PT- , OT-  Patient/family involved in discharge planning: PT- Patient,  OT-Patient, SLP-Patient  MD ELOS: ~ 7 days Medical Rehab Prognosis:  Good Assessment: Pt is a  71 yr old female with hx of  T7 compression fx s/p fall-  Also has DM-  A1c 7.4- cannot tolerate meds, HTN, HLD; hypothyroidism and chronic tremor- that's a little worse.   Goals mod I   See Team Conference Notes for weekly updates to the plan of care

## 2022-02-09 NOTE — Progress Notes (Signed)
Occupational Therapy Session Note  Patient Details  Name: ADALEAH FORGET MRN: [id number] Date of Birth: March 13, 1951  Today's Date: 02/09/2022 OT Group Time: 1100-1200 OT Group Time Calculation (min): 60 min   Short Term Goals: Week 1:  OT Short Term Goal 1 (Week 1): STGs = LTGs  Skilled Therapeutic Interventions/Progress Updates:    Pt participated in rhythmic drumming group. Pain radiating from back to L shodler, rest provided PRN and empowered pt to take breaks or do RUE only as neededm Focus of group on BUE coordination, strengthening, endurance, timing/control, activity tolerance, and social participation and engagement. Pt performs session from seated position for energy conservation. Skilled interventions included grading movmeents through pain free ROM, accessing rhythm for coordination, cuing for back precaution adherence. Warm up performed prior to exercises and UB stretching completed at end of group with demo from OT. Pt able to select preferred song to share with group. Returned pt to room at end of session. Exited session with pt seated in recliner, exit alarm on and call light in reach   Therapy Documentation Precautions:  Precautions Precautions: Back, Fall Precaution Booklet Issued: No Required Braces or Orthoses: Splint/Cast, Spinal Brace Spinal Brace: Thoracolumbosacral orthotic Restrictions Weight Bearing Restrictions: No GTherapy/Group: Group Therapy  Tonny Branch 02/09/2022, 6:53 AM

## 2022-02-09 NOTE — Progress Notes (Signed)
Physical Therapy Session Note  Patient Details  Name: [name] MRN: [id number] Date of Birth: 1951-07-06  Today's Date: 02/09/2022 PT Individual Time: 0832-0928 PT Individual Time Calculation (min): 56 min   Short Term Goals: Week 1:  PT Short Term Goal 1 (Week 1): STG=LTG based on ELOS  Skilled Therapeutic Interventions/Progress Updates:    Patient received sitting up in bed, agreeable to PT. She denies pain without TLSO on. Patient ambulating around her room to collect clothing with no AD and close supervision. She reports feeling more "shaky" today. Reports that she didn't eat a big breakfast due to nausea. Patient dressing with set up assist. She ambulated down to therapy gym with RW and close supervision/CGA. Patient completing large amplitude exercises with reciprocal motions, stepping forward, rocking forward and stepping sideways. Prolonged seated rest breaks between exercises due to fatigue. Toward end of session, patient reporting feeling lightheaded. Seated BP: 96/60, HR 107BPM 100% O2. Standing BP: 57/37 113 BPM 99% O2 with reports of 7/10 dizziness. Patient marching in place with resolution of symptoms and BP back to 92/63. She requested to ambulate back to her room. CGA provided with RW. Upon returning to her room, patient laying down. BP 125/68 112BPM. Patient remaining reclined in bed, RN aware of patients orthostatics, bed alarm on, call light within reach.   Therapy Documentation Precautions:  Precautions Precautions: Back, Fall Precaution Booklet Issued: No Required Braces or Orthoses: Splint/Cast, Spinal Brace Spinal Brace: Thoracolumbosacral orthotic Restrictions Weight Bearing Restrictions: No    Therapy/Group: Individual Therapy  Karoline Caldwell, PT, DPT, CBIS  02/09/2022, 9:34 AM

## 2022-02-10 DIAGNOSIS — I10 Essential (primary) hypertension: Secondary | ICD-10-CM

## 2022-02-10 DIAGNOSIS — F32A Depression, unspecified: Secondary | ICD-10-CM

## 2022-02-10 DIAGNOSIS — M792 Neuralgia and neuritis, unspecified: Secondary | ICD-10-CM

## 2022-02-10 DIAGNOSIS — E1165 Type 2 diabetes mellitus with hyperglycemia: Secondary | ICD-10-CM

## 2022-02-10 LAB — GLUCOSE, CAPILLARY
Glucose-Capillary: 165 mg/dL — ABNORMAL HIGH (ref 70–99)
Glucose-Capillary: 196 mg/dL — ABNORMAL HIGH (ref 70–99)
Glucose-Capillary: 165 mg/dL — ABNORMAL HIGH (ref 70–99)
Glucose-Capillary: 131 mg/dL — ABNORMAL HIGH (ref 70–99)

## 2022-02-10 MED ORDER — BUPROPION HCL 100 MG PO TABS
100.0000 mg | ORAL_TABLET | Freq: Two times a day (BID) | ORAL | Status: DC
Start: 2022-02-10 — End: 2022-02-13
  Administered 2022-02-10 – 2022-02-13 (×7): 100 mg via ORAL
  Filled 2022-02-10 (×8): qty 1

## 2022-02-10 MED ORDER — GABAPENTIN 100 MG PO CAPS
100.0000 mg | ORAL_CAPSULE | Freq: Two times a day (BID) | ORAL | Status: DC
  Administered 2022-02-10 – 2022-02-13 (×7): 100 mg via ORAL
  Filled 2022-02-10 (×8): qty 1

## 2022-02-10 NOTE — Progress Notes (Signed)
Occupational Therapy Session Note ° °Patient Details  °Name: [name] °MRN: [id number] °Date of Birth: 12/26/1950 ° °Today's Date: 02/10/2022 °OT Individual Time: 0815-0915 °OT Individual Time Calculation (min): 60 min  ° ° °Short Term Goals: °Week 1:  OT Short Term Goal 1 (Week 1): STGs = LTGs ° °Skilled Therapeutic Interventions/Progress Updates:  °   °Pt received in EOB with unrated back and RUE pain rest and RN provides meds °ADL: °Pt completes ADL at overall supervision for LB, set up for UB Level. Skilled interventions include: frequent cuing for back precautions, education about OH as pt with >20 point drop in BP sit- 124/75 standing- 100/62 however pt continues to be confused as pt call the sx as feeling faint v staff saying dizzy (OT educates that we are often describing the same symptom just calling it different) Pt completes walking with RW to elevator >200 feet with supervision feeling 5/10 faint sx. Pt provided seated rest ° ° °Therapeutic exercise °Functonal mobility therex/conditioning: °1 min each x10 rest ° supine<>sit in B directions- 4, 3 reps focus on log roll technique ° sit to stand, 10, 9 reps °Modified sit ups from wedge 9, 10 reps ° °3 rounds with 1 min rest between rounds for improved functional moiblity &conditioning requried for BADLs/IADLs ° ° °Pt left at end of session in recliner with exit alarm on, call light in reach and all needs met ° ° °Therapy Documentation °Precautions:  °Precautions °Precautions: Back, Fall °Precaution Booklet Issued: No °Required Braces or Orthoses: Splint/Cast, Spinal Brace °Spinal Brace: Thoracolumbosacral orthotic °Restrictions °Weight Bearing Restrictions: No ° ° °Therapy/Group: Individual Therapy ° ° M  °02/10/2022, 6:48 AM °

## 2022-02-10 NOTE — Progress Notes (Addendum)
Physical Therapy Session Note  Patient Details  Name: [name] MRN: [id number] Date of Birth: 08/22/1951  Today's Date: 02/10/2022 PT Individual Time: 1030-1105; 6222-9798 PT Individual Time Calculation (min): 35 min , 60 min  Short Term Goals: Week 1:  PT Short Term Goal 1 (Week 1): STG=LTG based on ELOS  Skilled Therapeutic Interventions/Progress Updates:  Tx 1:  Pt seated in recliner.  She reported pain L neck, shoulder down to fingers, rated 6/10, premedicated.  Seated 1 minute marching focusing on activation of abductors to keep feet apart. [place] type exs in standing: 15 x 1 each- calf raises (without use of UEs for balance challenge) , mini squats with light use of UEs, bil toe raises with bil UE support.  Pt exhibited minimal excursion bil feet.    Pt requested sitting after each bout, due to dizziness.  PT instructed pt in self stretching via ankle pumps with maximal excursion.  At end of session, pt seated in recliner with K pad secured around L neck and shoulder.  Needs left at hand and seat belt alarm set.  Tx 2;  Pt seated in recliner.  She rated pain around fx site 5/10, premedicated. She was unsure if the K pad on her shoulder was helpful or not.  Stand pivot transfer with RW to wc, CGA.  Endurance activity seated in wc, using Kinetron at resistance 60cm/sec x 2 minute, x 2. Seated edge of mat, R/L alternating ankle DF.  Pt's bil heel cords tight.  Balance challenge/neuro re-ed standing with forefeet on blue wedge, and UE support.  When pt lifted hands off of support, she had immediate LOB backwards, without any balance strategies elicited.  She did flex trunk forward with manual cues.    Vitals in sitting: HR  92; O2 sats 100% on room air;  Bp 127/55.  Gait training with wc follow, RW on level tile, x 175' with close supervision CGA, x 200'.  Backwards gait with RW x 4' x 4 throughout session, CGA.  BP after gait 134/55; stated she felt "a little light headed, but  not bad."  Stand pivot transfers throughout session without AD, CGA.   At end of session, pt reclined in recliner with LEs elevated.  She asked that K pad be placed on L shoulder and L upper back, which PT did. Seat belt alarm set and needs left at hand.     Therapy Documentation Precautions:  Precautions Precautions: Back, Fall Precaution Booklet Issued: No Required Braces or Orthoses: Splint/Cast, Spinal Brace Spinal Brace: Thoracolumbosacral orthotic Restrictions Weight Bearing Restrictions: No         Therapy/Group: Individual Therapy  Jerra Huckeby 02/10/2022, 12:25 PM

## 2022-02-10 NOTE — Progress Notes (Signed)
PROGRESS NOTE   Subjective/Complaints: Patient seen sitting up at the edge of her bed working with therapy this morning.  She states she did not sleep well overnight.  Long discussion with patient regarding her left upper extremity pain, activities, allergies.  Ultimately, patient states she would like to trial gabapentin again.  She also requests evaluation of a low dosage of Wellbutrin.  Discussed orthostasis with therapies. ROS:  Pt denies SOB, abd pain, CP, N/V/C/D, and vision changes   Objective:   No results found. Recent Labs    02/08/22 0621  WBC 4.0  HGB 10.5*  HCT 31.0*  PLT 268    Recent Labs    02/07/22 1521 02/08/22 0621  NA  --  138  K  --  4.1  CL  --  103  CO2  --  27  GLUCOSE  --  164*  BUN  --  29*  CREATININE 1.11* 0.99  CALCIUM  --  9.0     Intake/Output Summary (Last 24 hours) at 02/10/2022 1131 Last data filed at 02/10/2022 0751 Gross per 24 hour  Intake 356 ml  Output --  Net 356 ml         Physical Exam: Vital Signs Blood pressure 125/72, pulse 100, temperature 97.6 F (36.4 C), resp. rate 16, SpO2 96 %.     General: awake, alert, appropriate  HENT: conjugate gaze; periorbital ecchymosis. CV: borderline rate; no JVD Pulmonary: CTA B/L; no W/R/R- good air movement GI: soft, NT, ND, (+)BS Psychiatric: appropriate Neurological: Alert Intention tremors appear to be improving  Assessment/Plan: 1. Functional deficits which require 3+ hours per day of interdisciplinary therapy in a comprehensive inpatient rehab setting. Physiatrist is providing close team supervision and 24 hour management of active medical problems listed below. Physiatrist and rehab team continue to assess barriers to discharge/monitor patient progress toward functional and medical goals  Care Tool:  Bathing    Body parts bathed by patient: Right arm, Left arm, Chest, Abdomen, Front perineal area,  Buttocks, Face, Left lower leg, Right lower leg, Left upper leg, Right upper leg         Bathing assist Assist Level: Supervision/Verbal cueing     Upper Body Dressing/Undressing Upper body dressing   What is the patient wearing?: Pull over shirt, Orthosis, Bra    Upper body assist Assist Level: Set up assist    Lower Body Dressing/Undressing Lower body dressing      What is the patient wearing?: Underwear/pull up, Pants     Lower body assist Assist for lower body dressing: Supervision/Verbal cueing     Toileting Toileting    Toileting assist Assist for toileting: Contact Guard/Touching assist     Transfers Chair/bed transfer  Transfers assist     Chair/bed transfer assist level: Supervision/Verbal cueing     Locomotion Ambulation   Ambulation assist      Assist level: Contact Guard/Touching assist Assistive device: Walker-rolling Max distance: 100   Walk 10 feet activity   Assist     Assist level: Contact Guard/Touching assist Assistive device: Walker-rolling   Walk 50 feet activity   Assist    Assist level: Contact Guard/Touching assist Assistive device:  Walker-rolling    Walk 150 feet activity   Assist Walk 150 feet activity did not occur: Safety/medical concerns  Assist level: Contact Guard/Touching assist Assistive device: No Device    Walk 10 feet on uneven surface  activity   Assist Walk 10 feet on uneven surfaces activity did not occur: Safety/medical concerns         Wheelchair     Assist Is the patient using a wheelchair?: No             Wheelchair 50 feet with 2 turns activity    Assist            Wheelchair 150 feet activity     Assist          Blood pressure 125/72, pulse 100, temperature 97.6 F (36.4 C), resp. rate 16, SpO2 96 %.    Medical Problem List and Plan: 1. Functional deficits secondary to T7 compression fracture status post fall             Continue CIR 2.   Antithrombotics: -DVT/anticoagulation:  Pharmaceutical: Lovenox             -antiplatelet therapy: Aspirin 325 mg daily 3. Pain Management: Robaxin, oxycodone as needed, lidocaine patch as needed  Gabapentin 100 twice daily restarted on 2/25 per patient request 4. Mood: LCSW to evaluate and provide emotional support             --Continue Wellbutrin 300 mg daily (sees therapist for possible depression), decreased to 100 bid mg on 2/25 as patient states this was recently changed as an outpatient             -antipsychotic agents: Not applicable 5. Neuropsych: This patient is capable of making decisions on her own behalf. 6. Skin/Wound Care: Routine skin care checks             -- forehead laceration, hematoma: Monitor. Sutures are absorable 7. Fluids/Electrolytes/Nutrition: Routine I's and O's and follow-up chemistries 8.  Diabetes mellitus with hyperglycemia: CBGs and sliding scale insulin, carb modified diet             -- Does not appear she was on home medications  2/23- CBGs 140s-200s- A1c 7.4-  2/24- pt allergic to 31 meds- many of them DM meds- CBGs 160s-170s- doesn't want ot add meds right now- didn't take anything at home  Remains elevated on 2/25 9.  Hypertension: On Bumex at home, currently held Controlled on 2/25 10: Hyperlipidemia: On Repatha at home q 14 days 11: Insomnia: melatonin not effective 12: Pernicious anemia: Normal H&H and normal red blood cell indices. Continue B 12 injection monthly 13: Hypothyroidism: Continue Synthroid 50 mcg every morning 14: Moderate malnutrition secondary to chronic illness: Follow-up albumin, total protein in tomorrow morning's labs  2/23- Albumin 2.7- not bad- but was ~ 4.0- total protein also droppe-d will d/w pt.  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 Constipation in setting of chronic diarrhea  2/23- LBM 6 days  ago- will try prune juice, but if not, will order sorbitol at 5pm if no BM  2/24- Sorbitol 15cc ordered for 3pm if no BM  LOS: 3 days A FACE TO FACE EVALUATION WAS PERFORMED  Tranquilino Fischler Lorie Phenix 02/10/2022, 11:31 AM

## 2022-02-10 NOTE — Progress Notes (Signed)
Speech Language Pathology Daily Session Note  Patient Details  Name: [name] MRN: [id number] Date of Birth: [birth year]  Today's Date: 02/10/2022 SLP Individual Time: 1115-1200 SLP Individual Time Calculation (min): 45 min  Short Term Goals: Week 1: SLP Short Term Goal 1 (Week 1): STG=LTG due to ELOS  Skilled Therapeutic Interventions: Skilled ST services focused on cognitive skills. Pt expressed stress and became emotional, crying, when talking about her responsibilities at home including caring for her 73 year old granddaughter and mentally ill daughter. SLP provided emotional support. Pt was able to recall medication consumed at home via list on her phone and support memory deficits at baseline requiring written aids. Pt manages her her own medications as well as her daughters. Pt initially demonstrated poor problem solving skills, supporting taking twice a day medication all at once. Pt was agreeable to taking medication twice a 12 hours apart and demonstrated recall of this with mod A fade to supervision A verbal cues. SLP created medication list and will complete pill organizer in next session. Recommend to continue ST services.     Pain Pain Assessment Pain Scale: 0-10 Pain Score: 2  Faces Pain Scale: Hurts a little bit Pain Type: Acute pain Pain Location: Shoulder Pain Orientation: Left Pain Descriptors / Indicators: Aching;Discomfort Pain Frequency: Constant Pain Onset: On-going Patients Stated Pain Goal: 1 Pain Intervention(s): Medication (See eMAR)  Therapy/Group: Individual Therapy  [name]  [facility] 02/10/2022, 12:23 PM

## 2022-02-11 LAB — GLUCOSE, CAPILLARY
Glucose-Capillary: 160 mg/dL — ABNORMAL HIGH (ref 70–99)
Glucose-Capillary: 188 mg/dL — ABNORMAL HIGH (ref 70–99)
Glucose-Capillary: 166 mg/dL — ABNORMAL HIGH (ref 70–99)
Glucose-Capillary: 124 mg/dL — ABNORMAL HIGH (ref 70–99)

## 2022-02-11 NOTE — Progress Notes (Signed)
Occupational Therapy Session Note  Patient Details  Name: [name] MRN: [id number] Date of Birth: [birth year]  Today's Date: 02/12/2022 OT Group Time: 1000-1055 OT Group Time Calculation (min): 55 min   Skilled Therapeutic Interventions/Progress Updates: Pt engaged in therapeutic w/c level dance group focusing on patient choice, UE/LE strengthening, salience, activity tolerance, and social participation. Pt was guided through various dance-based exercises involving UEs/LEs and trunk. All music was selected by group members. Emphasis placed on activity tolerance and general strengthening while adhering to back precautions. Pt with very high levels of participation during group, simultaneously moving UEs/LEs in beat to music, appearing cheerful. Mindful of her precautions without cues. She declined standing though was provided with the opportunity. Pt returned to room by RT.     Therapy Documentation Precautions:  Precautions Precautions: Back, Fall Precaution Booklet Issued: No Required Braces or Orthoses: Splint/Cast, Spinal Brace Spinal Brace: Thoracolumbosacral orthotic (PRN) Restrictions Weight Bearing Restrictions: No Pain: no s/s pain during tx Pain Assessment Faces Pain Scale: No hurt ADL: ADL Eating: Independent Grooming: Independent Upper Body Bathing: Independent Where Assessed-Upper Body Bathing: Shower Lower Body Bathing: Modified independent Where Assessed-Lower Body Bathing: Shower Upper Body Dressing: Independent Where Assessed-Upper Body Dressing: Edge of bed Lower Body Dressing: Modified independent Where Assessed-Lower Body Dressing: Edge of bed Toileting: Modified independent Where Assessed-Toileting: Glass blower/designer: Diplomatic Services operational officer Method: Counselling psychologist: Ambulance person Transfer: Modified independent Tub/Shower Transfer Method: Optometrist: Transfer tub  bench   Therapy/Group: Group Therapy  Skeet Simmer 02/12/2022, 4:35 PM

## 2022-02-12 DIAGNOSIS — S7291XA Unspecified fracture of right femur, initial encounter for closed fracture: Secondary | ICD-10-CM

## 2022-02-12 DIAGNOSIS — S7290XA Unspecified fracture of unspecified femur, initial encounter for closed fracture: Secondary | ICD-10-CM

## 2022-02-12 LAB — GLUCOSE, CAPILLARY
Glucose-Capillary: 143 mg/dL — ABNORMAL HIGH (ref 70–99)
Glucose-Capillary: 177 mg/dL — ABNORMAL HIGH (ref 70–99)
Glucose-Capillary: 146 mg/dL — ABNORMAL HIGH (ref 70–99)
Glucose-Capillary: 175 mg/dL — ABNORMAL HIGH (ref 70–99)

## 2022-02-12 MED ORDER — OXYCODONE HCL 5 MG PO TABS
2.5000 mg | ORAL_TABLET | ORAL | Status: DC | PRN
  Administered 2022-02-12 – 2022-02-13 (×2): 2.5 mg via ORAL
  Filled 2022-02-12 (×2): qty 1

## 2022-02-12 NOTE — Discharge Summary (Signed)
Physician Discharge Summary  Patient ID: [name] MRN: [id number] DOB/AGE: [birth year] 71 y.o.  Admit date: 02/07/2022 Discharge date: 02/13/2022  Discharge Diagnoses:  Principal Problem:   T7 vertebral fracture [facility]) Active Problems:   Neuropathic pain   Essential hypertension   Controlled type 2 diabetes mellitus with hyperglycemia, without long-term current use of insulin (HCC)   Depression Constipation Orthostatic hypotension Left neck pain  Discharged Condition: good  Significant Diagnostic Studies: CT Head Wo Contrast  Result Date: 01/30/2022 CLINICAL DATA:  Fall EXAM: CT HEAD WITHOUT CONTRAST CT CERVICAL SPINE WITHOUT CONTRAST TECHNIQUE: Multidetector CT imaging of the head and cervical spine was performed following the standard protocol without intravenous contrast. Multiplanar CT image reconstructions of the cervical spine were also generated. RADIATION DOSE REDUCTION: This exam was performed according to the departmental dose-optimization program which includes automated exposure control, adjustment of the mA and/or kV according to patient size and/or use of iterative reconstruction technique. COMPARISON:  None. FINDINGS: CT HEAD FINDINGS Brain: There is no mass, hemorrhage or extra-axial collection. The size and configuration of the ventricles and extra-axial CSF spaces are normal. Dense mineralization in the basal ganglia and cerebellar hemispheres. Vascular: No abnormal hyperdensity of the major intracranial arteries or dural venous sinuses. No intracranial atherosclerosis. Skull: Small right frontal scalp hematoma.  No skull fracture. Sinuses/Orbits: No fluid levels or advanced mucosal thickening of the visualized paranasal sinuses. No mastoid or middle ear effusion. The orbits are normal. CT CERVICAL SPINE FINDINGS Alignment: No static subluxation. Facets are aligned. Occipital condyles are normally positioned. Skull base and vertebrae: No acute fracture. Soft tissues  and spinal canal: No prevertebral fluid or swelling. No visible canal hematoma. Disc levels: No advanced spinal canal or neural foraminal stenosis. Upper chest: No pneumothorax, pulmonary nodule or pleural effusion. Other: Normal visualized paraspinal cervical soft tissues. IMPRESSION: 1. No acute intracranial abnormality. 2. Small right frontal scalp hematoma without skull fracture. 3. No acute fracture or static subluxation of the cervical spine. Electronically Signed   By: Ulyses Jarred M.D.   On: 01/30/2022 20:46   CT Cervical Spine Wo Contrast  Result Date: 01/30/2022 CLINICAL DATA:  Fall EXAM: CT HEAD WITHOUT CONTRAST CT CERVICAL SPINE WITHOUT CONTRAST TECHNIQUE: Multidetector CT imaging of the head and cervical spine was performed following the standard protocol without intravenous contrast. Multiplanar CT image reconstructions of the cervical spine were also generated. RADIATION DOSE REDUCTION: This exam was performed according to the departmental dose-optimization program which includes automated exposure control, adjustment of the mA and/or kV according to patient size and/or use of iterative reconstruction technique. COMPARISON:  None. FINDINGS: CT HEAD FINDINGS Brain: There is no mass, hemorrhage or extra-axial collection. The size and configuration of the ventricles and extra-axial CSF spaces are normal. Dense mineralization in the basal ganglia and cerebellar hemispheres. Vascular: No abnormal hyperdensity of the major intracranial arteries or dural venous sinuses. No intracranial atherosclerosis. Skull: Small right frontal scalp hematoma.  No skull fracture. Sinuses/Orbits: No fluid levels or advanced mucosal thickening of the visualized paranasal sinuses. No mastoid or middle ear effusion. The orbits are normal. CT CERVICAL SPINE FINDINGS Alignment: No static subluxation. Facets are aligned. Occipital condyles are normally positioned. Skull base and vertebrae: No acute fracture. Soft tissues and  spinal canal: No prevertebral fluid or swelling. No visible canal hematoma. Disc levels: No advanced spinal canal or neural foraminal stenosis. Upper chest: No pneumothorax, pulmonary nodule or pleural effusion. Other: Normal visualized paraspinal cervical soft tissues. IMPRESSION: 1. No acute intracranial abnormality.  2. Small right frontal scalp hematoma without skull fracture. 3. No acute fracture or static subluxation of the cervical spine. Electronically Signed   By: Ulyses Jarred M.D.   On: 01/30/2022 20:46   CT Thoracic Spine Wo Contrast  Result Date: 01/30/2022 CLINICAL DATA:  Acute thoracic myelopathy.  Fall EXAM: CT THORACIC SPINE WITHOUT CONTRAST TECHNIQUE: Multidetector CT images of the thoracic were obtained using the standard protocol without intravenous contrast. RADIATION DOSE REDUCTION: This exam was performed according to the departmental dose-optimization program which includes automated exposure control, adjustment of the mA and/or kV according to patient size and/or use of iterative reconstruction technique. COMPARISON:  None. FINDINGS: Alignment: Normal. Vertebrae: There is chronic compression deformity of L1. No acute vertebral abnormality. Paraspinal and other soft tissues: Calcific aortic atherosclerosis. Disc levels: There is no spinal canal stenosis. The neural foramina are widely patent. IMPRESSION: 1. No acute fracture or static subluxation of the thoracic spine. 2. Chronic compression deformity of L1. Aortic Atherosclerosis (ICD10-I70.0). Electronically Signed   By: Ulyses Jarred M.D.   On: 01/30/2022 20:36   MR Cervical Spine W or Wo Contrast  Result Date: 01/31/2022 CLINICAL DATA:  Fall EXAM: MRI CERVICAL AND THORACIC SPINE WITHOUT AND WITH CONTRAST TECHNIQUE: Multiplanar and multiecho pulse sequences of the cervical spine, to include the craniocervical junction and cervicothoracic junction, and the thoracic spine, were obtained without and with intravenous contrast. CONTRAST:   47mL GADAVIST GADOBUTROL 1 MMOL/ML IV SOLN COMPARISON:  None. FINDINGS: MRI CERVICAL SPINE FINDINGS Alignment: Grade 1 retrolisthesis at C5-6 and grade 1 anterolisthesis at C6-7 Vertebrae: No fracture, evidence of discitis, or bone lesion. Cord: Normal signal and morphology. Posterior Fossa, vertebral arteries, paraspinal tissues: Negative. Disc levels: C1-2: Unremarkable. C2-3: Normal disc space and facet joints. There is no spinal canal stenosis. No neural foraminal stenosis. C3-4: Left uncovertebral hypertrophy. There is no spinal canal stenosis. Mild left neural foraminal stenosis. C4-5: Small disc bulge with bilateral uncovertebral hypertrophy. Mild spinal canal stenosis. Mild right and moderate left neural foraminal stenosis. C5-6: Small disc bulge with uncovertebral hypertrophy. Moderate spinal canal stenosis. Severe bilateral neural foraminal stenosis. C6-7: Small disc bulge with uncovertebral hypertrophy. There is no spinal canal stenosis. No neural foraminal stenosis. C7-T1: Normal disc space and facet joints. There is no spinal canal stenosis. No neural foraminal stenosis. MRI THORACIC SPINE FINDINGS Alignment:  Physiologic. Vertebrae: There is mild height loss at T7 with a small amount of bone marrow edema. The other thoracic vertebral bodies are normal. Cord:  Normal signal and morphology. Paraspinal and other soft tissues: Negative. Disc levels: No spinal canal stenosis. IMPRESSION: 1. Mild T7 compression fracture with a small amount of bone marrow edema, compatible with acute to subacute injury. Minimal height loss. 2. Multilevel degenerative disc disease of the cervical spine with moderate C5-6 and mild C4-5 spinal canal stenosis. 3. Severe bilateral neural foraminal stenosis at C5-6. Electronically Signed   By: Ulyses Jarred M.D.   On: 01/31/2022 02:41   MR THORACIC SPINE W WO CONTRAST  Result Date: 01/31/2022 CLINICAL DATA:  Fall EXAM: MRI CERVICAL AND THORACIC SPINE WITHOUT AND WITH CONTRAST  TECHNIQUE: Multiplanar and multiecho pulse sequences of the cervical spine, to include the craniocervical junction and cervicothoracic junction, and the thoracic spine, were obtained without and with intravenous contrast. CONTRAST:  92mL GADAVIST GADOBUTROL 1 MMOL/ML IV SOLN COMPARISON:  None. FINDINGS: MRI CERVICAL SPINE FINDINGS Alignment: Grade 1 retrolisthesis at C5-6 and grade 1 anterolisthesis at C6-7 Vertebrae: No fracture, evidence of discitis, or  bone lesion. Cord: Normal signal and morphology. Posterior Fossa, vertebral arteries, paraspinal tissues: Negative. Disc levels: C1-2: Unremarkable. C2-3: Normal disc space and facet joints. There is no spinal canal stenosis. No neural foraminal stenosis. C3-4: Left uncovertebral hypertrophy. There is no spinal canal stenosis. Mild left neural foraminal stenosis. C4-5: Small disc bulge with bilateral uncovertebral hypertrophy. Mild spinal canal stenosis. Mild right and moderate left neural foraminal stenosis. C5-6: Small disc bulge with uncovertebral hypertrophy. Moderate spinal canal stenosis. Severe bilateral neural foraminal stenosis. C6-7: Small disc bulge with uncovertebral hypertrophy. There is no spinal canal stenosis. No neural foraminal stenosis. C7-T1: Normal disc space and facet joints. There is no spinal canal stenosis. No neural foraminal stenosis. MRI THORACIC SPINE FINDINGS Alignment:  Physiologic. Vertebrae: There is mild height loss at T7 with a small amount of bone marrow edema. The other thoracic vertebral bodies are normal. Cord:  Normal signal and morphology. Paraspinal and other soft tissues: Negative. Disc levels: No spinal canal stenosis. IMPRESSION: 1. Mild T7 compression fracture with a small amount of bone marrow edema, compatible with acute to subacute injury. Minimal height loss. 2. Multilevel degenerative disc disease of the cervical spine with moderate C5-6 and mild C4-5 spinal canal stenosis. 3. Severe bilateral neural foraminal  stenosis at C5-6. Electronically Signed   By: Ulyses Jarred M.D.   On: 01/31/2022 02:41   DG Chest Port 1 View  Result Date: 01/31/2022 CLINICAL DATA:  Shortness of breath. EXAM: PORTABLE CHEST 1 VIEW COMPARISON:  None. FINDINGS: Mild cardiomegaly with mild central vascular congestion. No focal consolidation, pleural effusion, or pneumothorax. Atherosclerotic calcification of the aorta. No acute osseous pathology. IMPRESSION: Mild cardiomegaly with mild central vascular congestion. No focal consolidation. Electronically Signed   By: Anner Crete M.D.   On: 01/31/2022 00:36   DG Hand Complete Left  Result Date: 01/31/2022 CLINICAL DATA:  71 year old female status post fall. EXAM: LEFT HAND - COMPLETE 3+ VIEW COMPARISON:  None. FINDINGS: Distal radius and ulna appear intact. There is abnormal widening of the scapholunate interval. But otherwise carpal bone alignment is maintained. Joint space loss and subchondral sclerosis along the radial carpal row and also at the 1st [facility] joint. Questionable healed 5th metacarpal fracture. Metacarpals appear intact. Diffuse distal IP joint space loss with bulky osteophytosis in keeping with osteoarthritis. No phalanx fracture or dislocation identified. No discrete soft tissue injury. Calcified peripheral vascular disease at the distal forearm and wrist. IMPRESSION: 1. Widening of the Scapholunate interval compatible with acute or chronic ligamentous injury which can predispose to SLAC wrist. 2. No acute fracture or dislocation identified about the left hand. 3. 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 4. Calcified peripheral vascular disease. Electronically Signed   By: Genevie Ann M.D.   On: 01/31/2022 06:25   DG Hand Complete Right  Result Date: 01/31/2022 CLINICAL DATA:  Status post fall with head and hand injuries. EXAM: RIGHT HAND - COMPLETE 3+ VIEW COMPARISON:  None. FINDINGS: No acute fracture or dislocation identified. There are marked degenerative  changes involving the D IP joints. Moderate basilar joint and first MCP joint degenerative change also noted. Soft tissues are unremarkable. IMPRESSION: 1. No acute findings. 2. Advanced osteoarthritis. Electronically Signed   By: Kerby Moors M.D.   On: 01/31/2022 06:24    Labs:  Basic Metabolic Panel: Recent Labs  Lab 02/07/22 1521 02/08/22 0621  NA  --  138  K  --  4.1  CL  --  103  CO2  --  27  GLUCOSE  --  164*  BUN  --  29*  CREATININE 1.11* 0.99  CALCIUM  --  9.0    CBC: Recent Labs  Lab 02/08/22 0621  WBC 4.0  NEUTROABS 2.4  HGB 10.5*  HCT 31.0*  MCV 89.6  PLT 268    CBG: Recent Labs  Lab 02/12/22 0640 02/12/22 1149 02/12/22 1645 02/12/22 2119 02/13/22 0640  GLUCAP 143* 177* 146* 175* 157*    Brief HPI:   [name] is a 71 y.o. female with a history of chronic dizziness and tremors who fell at home, unwitnessed.  She presented to [facility] on 01/30/2022 complaining of diffuse pain particularly of the bilateral upper extremities.  She sustained a right frontal laceration with hematoma.  Neurosurgery was consulted for her bilateral upper extremity pain.  She was transferred to [facility] for MRI.  This was significant for T7 compression fracture.  She was also noted to have mild to moderate central stenosis of cervical spine.  She had left hand pain possible scapholunate ligamentous injury.   Hospital Course: NAKEYA ADINOLFI was admitted to rehab 02/07/2022 for inpatient therapies to consist of PT, ST and OT at least three hours five days a week. Past admission physiatrist, therapy team and rehab RN have worked together to provide customized collaborative inpatient rehab. Patient noted to be constipated in the setting of chronic diarrhea. Prune juice given 2/23 and needed sorbitol on 2/24. She was having more discomfort with her TLSO and as it was prescribed for comfort for her stable T spine fracture, she was allowed to remove it for  therapies. Discussed strategies to mitigate orthostatic hypotension with therapists. Primary pain at discharge is left neck and left upper back pain. Will continue gabapentin and oxycodone for pain control. There was consideration of gabapentin causing tremors, however the patient stated that the tremors were present before initiating gabapentin. She was getting effective pain relief with gabapentin and therefore the patient was prescribed gabapentin three times daily per patient request and Dr. Lissa Morales approval. Lidoderm patches not effective. Consider nerve stimulation procedure if pain syndrome persists per Dr. Ranell Patrick.  Blood pressures were monitored on TID basis and remained controlled off Bumex. Discussed resuming this as needed for edema and to take her potassium supplementation as well.  Diabetes has been monitored with ac/hs CBG checks and SSI was use prn for tighter BS control.    Rehab course: During patient's stay in rehab weekly team conferences were held to monitor patient's progress, set goals and discuss barriers to discharge. At admission, patient required supervision with mobility and ADLs  She  has had improvement in activity tolerance, balance, postural control as well as ability to compensate for deficits. She has had improvement in functional use RUE/LUE  and RLE/LLE as well as improvement in awareness  Disposition:  Discharge disposition: 01-Home or Self Care     Diet: Carb modified  Special Instructions:  Discuss with PCP resumption of Bumex and potassium supplement. May resume as needed for swelling per Dr. Ranell Patrick.  No driving, alcohol consumption or tobacco use.   30-35 minutes were spent on discharge planning and discharge summary.   Discharge Instructions     Discharge patient   Complete by: As directed    Discharge disposition: 01-Home or Self Care   Discharge patient date: 02/13/2022       Allergies as of 02/13/2022       Reactions   Atenolol  Stomach problems, chills   Atorvastatin    Other reaction(s): retain fld   Canagliflozin    ALL DIABETIC MEDS PER PT   abdominal pain, headache   Losartan Potassium    chills, HA, stomach pain   Acarbose Nausea Only    headache,nausea   Cinnamon    Stomach pain   Empagliflozin     headaches   Glimepiride    ALL DIABETIC MEDS PER PT  abdominal pain, headache   Metformin And Related Nausea And Vomiting, Other (See Comments)   Patient states that she has severe chills, headache and cramping additionally. Says blood sugar is uncontrolled because of this   Other Other (See Comments)   ALL DIABETIC MEDS PER PT    Pioglitazone Other (See Comments)   ALL DIABETIC MEDS PER PT  Other reaction(s): peripheral edema   Pravastatin    Other reaction(s): retain fld   Sitagliptin    ALL DIABETIC MEDS PER PT  Other reaction(s): headache   Spironolactone-hctz Other (See Comments)   Statins    Myalgia   Sulfamethoxazole Other (See Comments)   ALL DIABETIC MEDS PER PT    Tramadol Hcl Itching   Acarbose Other (See Comments)   Atenolol Other (See Comments)   Atorvastatin Other (See Comments)   Canagliflozin Other (See Comments)   Crestor [rosuvastatin] Other (See Comments)   Empagliflozin Other (See Comments)   Glimepiride Other (See Comments)   Losartan Potassium Other (See Comments)   Metformin Other (See Comments)   Other Other (See Comments)   Pioglitazone Other (See Comments)   Pravastatin Other (See Comments)   Spironolactone-hctz Other (See Comments)   Sulfa Antibiotics Rash, Other (See Comments)   Mother has told patient in the past not to take, she cannot recall there reaction        Medication List     STOP taking these medications    buPROPion 150 MG 24 hr tablet Commonly known as: WELLBUTRIN XL Replaced by: buPROPion 100 MG tablet   feeding supplement (GLUCERNA SHAKE) Liqd Replaced by: nutrition supplement (JUVEN) Pack   insulin aspart 100 UNIT/ML  injection Commonly known as: novoLOG   lidocaine 5 % Commonly known as: LIDODERM   methocarbamol 500 MG tablet Commonly known as: ROBAXIN   oxyCODONE-acetaminophen 7.5-325 MG tablet Commonly known as: PERCOCET   temazepam 15 MG capsule Commonly known as: RESTORIL       TAKE these medications    acetaminophen 500 MG tablet Commonly known as: TYLENOL Take 500 mg by mouth every 6 (six) hours as needed for moderate pain. What changed: Another medication with the same name was removed. Continue taking this medication, and follow the directions you see here.   aspirin 325 MG EC tablet Take 1 tablet (325 mg total) by mouth daily. What changed: Another medication with the same name was removed. Continue taking this medication, and follow the directions you see here.   bumetanide 1 MG tablet Commonly known as: BUMEX Take 1 tablet (1 mg total) by mouth every morning.   buPROPion 100 MG tablet Commonly known as: WELLBUTRIN Take 1 tablet (100 mg total) by mouth 2 (two) times daily. Replaces: buPROPion 150 MG 24 hr tablet   calamine lotion Apply 1 application topically as needed for itching.   cholecalciferol 25 MCG (1000 UNIT) tablet Commonly known as: VITAMIN D3 Take 1,000 Units by mouth 2 (two) times a week.   cyanocobalamin 1000 MCG/ML injection Commonly known as: (VITAMIN B-12) Inject 1,000 mcg into the muscle  every 30 (thirty) days. What changed: Another medication with the same name was removed. Continue taking this medication, and follow the directions you see here.   diphenoxylate-atropine 2.5-0.025 MG tablet Commonly known as: LOMOTIL TAKE 1 TABLET BY MOUTH TWICE DAILY AS NEEDED FOR  DIARRHEA  OR  LOOSE  STOOLS What changed: Another medication with the same name was removed. Continue taking this medication, and follow the directions you see here.   estradiol 0.1 MG/GM vaginal cream Commonly known as: ESTRACE Place 0.5 g vaginally 2 (two) times a week. Place 0.5g  nightly for two weeks then twice a week after What changed: Another medication with the same name was removed. Continue taking this medication, and follow the directions you see here.   gabapentin 100 MG capsule Commonly known as: NEURONTIN Take 1 capsule (100 mg total) by mouth 3 (three) times daily.   levothyroxine 50 MCG tablet Commonly known as: SYNTHROID Take 1 tablet (50 mcg total) by mouth daily. What changed: Another medication with the same name was removed. Continue taking this medication, and follow the directions you see here.   MAGNESIUM PO Take by mouth.   multivitamin with minerals Tabs tablet Take 1 tablet by mouth daily.   nutrition supplement (JUVEN) Pack Take 1 packet by mouth 2 (two) times daily between meals. Replaces: feeding supplement (GLUCERNA SHAKE) Liqd   Ensure Max Protein Liqd Take 330 mLs (11 oz total) by mouth 2 (two) times daily.   oxyCODONE 5 MG immediate release tablet Commonly known as: Oxy IR/ROXICODONE Take 0.5 tablets (2.5 mg total) by mouth every 4 (four) hours as needed for moderate pain. What changed: how much to take   pantoprazole 40 MG tablet Commonly known as: Protonix Take 1 tablet (40 mg total) by mouth daily. What changed: Another medication with the same name was removed. Continue taking this medication, and follow the directions you see here.   polyethylene glycol 17 g packet Commonly known as: MIRALAX / GLYCOLAX Take 17 g by mouth daily as needed for mild constipation.   Potassium 99 MG Tabs Take 198 mg by mouth daily.   Repatha SureClick 163 MG/ML Soaj Generic drug: Evolocumab INJECT 1 PEN SUBCUTANEOUSLY EVERY 14 DAYS What changed: Another medication with the same name was removed. Continue taking this medication, and follow the directions you see here.   senna 8.6 MG Tabs tablet Commonly known as: SENOKOT Take 1 tablet (8.6 mg total) by mouth 2 (two) times daily.        Follow-up Information     Raulkar,  Clide Deutscher, MD Follow up.   Specialty: Physical Medicine and Rehabilitation Why: 07/24/22 please arrive at 1:20pm for 1:40pm follow-up, thank you! Contact information: [address] [place] [address] [phone]         Consuella Lose, MD Follow up.   Specialty: Neurosurgery Why: Call on Wednesday for hospital follow-up appointment Contact information: [address] [place] [address] [phone]                 Signed: Barbie Banner 02/13/2022, 9:10 AM

## 2022-02-12 NOTE — Progress Notes (Signed)
PROGRESS NOTE   Subjective/Complaints: She would like to go home today, but willing to stay a couple of extra days if needed. Discussed with her and therapy plan for discharge tomorrow and she is agreeable. She has no complaints ROS:  Pt denies SOB, abd pain, CP, N/V/C/D, and vision changes   Objective:   No results found. No results for input(s): WBC, HGB, HCT, PLT in the last 72 hours. No results for input(s): NA, K, CL, CO2, GLUCOSE, BUN, CREATININE, CALCIUM in the last 72 hours.  Intake/Output Summary (Last 24 hours) at 02/12/2022 1346 Last data filed at 02/12/2022 1306 Gross per 24 hour  Intake 660 ml  Output --  Net 660 ml        Physical Exam: Vital Signs Blood pressure (!) 118/50, pulse 90, temperature 97.9 F (36.6 C), temperature source Oral, resp. rate 16, SpO2 100 %.     General: awake, alert, appropriate  HENT: conjugate gaze; periorbital ecchymosis. CV: borderline rate; no JVD Pulmonary: CTA B/L; no W/R/R- good air movement GI: soft, NT, ND, (+)BS Psychiatric: appropriate Neurological/MSK: Alert Intention tremors appear to be improving Standing at sink independently  Assessment/Plan: 1. Functional deficits which require 3+ hours per day of interdisciplinary therapy in a comprehensive inpatient rehab setting. Physiatrist is providing close team supervision and 24 hour management of active medical problems listed below. Physiatrist and rehab team continue to assess barriers to discharge/monitor patient progress toward functional and medical goals  Care Tool:  Bathing    Body parts bathed by patient: Right arm, Left arm, Chest, Abdomen, Front perineal area, Buttocks, Face, Left lower leg, Right lower leg, Left upper leg, Right upper leg         Bathing assist Assist Level: Independent     Upper Body Dressing/Undressing Upper body dressing   What is the patient wearing?: Pull over shirt,  Bra    Upper body assist Assist Level: Independent    Lower Body Dressing/Undressing Lower body dressing      What is the patient wearing?: Underwear/pull up, Pants     Lower body assist Assist for lower body dressing: Independent     Toileting Toileting    Toileting assist Assist for toileting: Independent     Transfers Chair/bed transfer  Transfers assist     Chair/bed transfer assist level: Independent with assistive device Chair/bed transfer assistive device: Museum/gallery exhibitions officer assist      Assist level: Supervision/Verbal cueing Assistive device: Walker-rolling Max distance: 350   Walk 10 feet activity   Assist     Assist level: Independent with assistive device Assistive device: Walker-rolling   Walk 50 feet activity   Assist    Assist level: Independent with assistive device Assistive device: Walker-rolling    Walk 150 feet activity   Assist Walk 150 feet activity did not occur: Safety/medical concerns  Assist level: Independent with assistive device Assistive device: Walker-rolling    Walk 10 feet on uneven surface  activity   Assist Walk 10 feet on uneven surfaces activity did not occur: Safety/medical concerns         Wheelchair     Assist Is the patient  using a wheelchair?: No             Wheelchair 50 feet with 2 turns activity    Assist            Wheelchair 150 feet activity     Assist          Blood pressure (!) 118/50, pulse 90, temperature 97.9 F (36.6 C), temperature source Oral, resp. rate 16, SpO2 100 %.    Medical Problem List and Plan: 1. Functional deficits secondary to T7 compression fracture status post fall             Continue CIR 2.  Impaired mobility, ambulating 300 feet, may discontinue Lovenox             -antiplatelet therapy: Aspirin 325 mg daily 3. Pain Management: Robaxin, oxycodone as needed, lidocaine patch as needed  Gabapentin 100  twice daily restarted on 2/25 per patient request  Decrease oxycodone to 2.5mg  q4H prn.  4. Depression: LCSW to evaluate and provide emotional support             --Wellbutrin decreased to 100 bid mg on 2/25 as patient states this was recently changed as an outpatient 5. Neuropsych: This patient is capable of making decisions on her own behalf. 6. Skin/Wound Care: Routine skin care checks             -- forehead laceration, hematoma: Monitor. Sutures are absorable 7. Fluids/Electrolytes/Nutrition: Routine I's and O's and follow-up chemistries 8.  Diabetes mellitus with hyperglycemia: CBGs and sliding scale insulin, carb modified diet             -- Does not appear she was on home medications  2/23- CBGs 140s-200s- A1c 7.4-  2/24- pt allergic to 31 meds- many of them DM meds- CBGs 160s-170s- doesn't want ot add meds right now- didn't take anything at home  Remains elevated on 2/25. Provide dietary education.  9.  Hypertension: On Bumex at home, currently held Controlled on 2/25 10: Hyperlipidemia: On Repatha at home q 14 days 11: Insomnia: melatonin not effective 12: Pernicious anemia: Normal H&H and normal red blood cell indices. Continue B 12 injection monthly 13: Hypothyroidism: Continue Synthroid 50 mcg every morning 14: Moderate malnutrition secondary to chronic illness: Follow-up albumin, total protein in tomorrow morning's labs  2/23- Albumin 2.7- not bad- but was ~ 4.0- total protein also droppe-d will d/w pt.  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 Constipation in setting of chronic diarrhea  2/23- LBM 6 days ago- will try prune juice, but if not, will order sorbitol at 5pm if no BM  2/24- Sorbitol 15cc ordered for 3pm if no BM  LOS: 5 days A FACE TO FACE EVALUATION WAS PERFORMED  Martha Clan P Khalilah Hoke 02/12/2022, 1:46 PM

## 2022-02-12 NOTE — Progress Notes (Signed)
Speech Language Pathology Discharge Summary  Patient Details  Name: [name] MRN: [id number] Date of Birth: May 09, 1951  Today's Date: 02/12/2022 SLP Individual Time: 1345-1430 SLP Individual Time Calculation (min): 45 min   Skilled Therapeutic Interventions:  Skilled ST services focused on education and cognitive skills. SLP facilitated personal medication and money management with BID pill organizer and account balancing task. Pt demonstrated x1 error with pill organizer that pt was able to self-correct. Pt required initial supervision A verbal cues to recognize errors in account balancing task fading to mod I. Pt supports baseline memory deficits, but supports problem solving is at or near baseline. SLP provided education pertaining to memory strategies given handout and need to check for errors in complex tasks. All questions were answered to satisfaction. Pt was left in room with call bell within reach and chair alarm set.   Patient has met 3 of 3 long term goals.  Patient to discharge at overall Modified Independent level.  Reasons goals not met:     Clinical Impression/Discharge Summary:   Pt made great progress meeting 2 out 2 goals, discharging at mod I. Pt completed functional complex problem solving, short term recall and higher attention tasks, including medication and money management. Pt supports baseline deficits in memory and double/triple checks all mild-complex tasks for errors. 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 Education has been completed with pt and all questions answered to satisfaction. Pt benefited from skilled ST services to maximize functional independence and reduce burden of care, requiring no further ST services.   Care Partner:  Caregiver Able to Provide Assistance: No     Recommendation:  None      Equipment: N/A   Reasons for discharge: Discharged from hospital   Patient/Family Agrees with Progress Made and  Goals Achieved: Yes    [name]  [employer] [facility] 02/12/2022, 3:37 PM

## 2022-02-12 NOTE — Patient Care Conference (Signed)
Inpatient RehabilitationTeam Conference and Plan of Care Update Date: 02/13/2022   Time: 09:26 AM   Patient Name: [name]      Medical Record Number: [id number]  Date of Birth: 1951-03-16 Sex: Female         Room/Bed: 3Z85Y/8F02D-74 Payor Info: Payor: Theme park manager MEDICARE / Plan: [facility] MEDICARE / Product Type: *No Product type* /    Admit Date/Time:  02/07/2022  2:41 PM  Primary Diagnosis:  T7 vertebral fracture [facility])  Hospital Problems: Principal Problem:   T7 vertebral fracture ([place]) Active Problems:   Neuropathic pain   Essential hypertension   Controlled type 2 diabetes mellitus with hyperglycemia, without long-term current use of insulin [facility])   Depression    Expected Discharge Date: Expected Discharge Date: 02/13/22  Team Members Present: Physician leading conference: Dr. Leeroy Cha Social Worker Present: Ovidio Kin, LCSW Nurse Present: Dorien Chihuahua, RN PT Present: Estevan Ryder, PT OT Present: Meriel Pica, OT SLP Present: Charolett Bumpers, SLP     Current Status/Progress Goal Weekly Team Focus  Bowel/Bladder     Continent of bladder; constipation, LBM 02/09/22   Continent   Laxative; enc toileting  Swallow/Nutrition/ Hydration             ADL's   mod I  Mod I  pt is ready for discharge, pt education completed   Mobility   ModI/Ind bed mobility, Ind/ModI STS/ tranfsers, ModI gait household and limited community distances, supervision community distances with RW, supervision steps x12 B HR  ModI/spv  pt/fam ed, dc planning, dynamic balance   Communication             Safety/Cognition/ Behavioral Observations  mod I  mod I  education completed with pt   Pain     Oxy prn pain   Pain controlled with prn meds at or below level 4   Assess need for and effectiveness of medications  Skin     Itching, bruising   No new skin integrity issues; bruising resolving and itching subsiding   Calamine lotion prn; assess skin q shift    Discharge  Planning:  HOme with granddaughter who is in and out between school and work, daughter who normally does not live there is there also. Pt will be alone at times   Team Discussion: Patient is doing well.  Patient on target to meet rehab goals: yes,  She is essentially ModI with supervision for longer distance gait. SLP working on   *See Care Plan and progress notes for long and short-term goals.   Revisions to Treatment Plan:  N/A   Teaching Needs: Safety, TLSO brace don/doff prn, medications, secondary risk management, etc  Current Barriers to Discharge: Decreased caregiver support  Possible Resolutions to Barriers: Family education Follow up services recommended DME: TTB and RW     Medical Summary Current Status: depression, neuropathic pain  Barriers to Discharge: Medical stability;Decreased family/caregiver support  Barriers to Discharge Comments: depression, her granddaughter is in school and both sons work, neuuropathi pain Possible Resolutions to [employer]: continue wellbutrin, discussed that we can be flexible in discharge timing tomorrow, continue gabapentin 100mg  BID   Continued Need for Acute Rehabilitation Level of Care: The patient requires daily medical management by a physician with specialized training in physical medicine and rehabilitation for the following reasons: Direction of a multidisciplinary physical rehabilitation program to maximize functional independence : Yes Medical management of patient stability for increased activity during participation in an intensive rehabilitation regime.: Yes Analysis of  laboratory values and/or radiology reports with any subsequent need for medication adjustment and/or medical intervention. : Yes   I attest that I was present, lead the team conference, and concur with the assessment and plan of the team.   Dorien Chihuahua B 02/13/2022, 7:34 AM

## 2022-02-12 NOTE — Progress Notes (Signed)
Physical Therapy Session Note  Patient Details  Name: [name] MRN: [id number] Date of Birth: 1951-05-10  Today's Date: 02/12/2022 PT Individual Time: 1435-1459 PT Individual Time Calculation (min): 24 min   Short Term Goals: Week 1:  PT Short Term Goal 1 (Week 1): STG=LTG based on ELOS  Skilled Therapeutic Interventions/Progress Updates:     Pt received seated in recliner and agrees to therapy. Reports pain in back of neck and down L arm. Number not provided and pt reports pain is improving. PT provides rest breaks as needed to manage pain. Pt performs stand step transfer to [facility] with cues for increased eccentric control of stand to sit. WC transport to gym for time management. Pt performs ambulatory transfer to car and car transfer with cues for sequencing. Following seated rest break, pt completes x12 6" steps with bilateral hand rails and CGA, with cues for step sequencing and positioning. WC transport back to room. Pt left seated in recliner with alarm intact and all needs within reach.  Therapy Documentation Precautions:  Precautions Precautions: Back, Fall Precaution Booklet Issued: No Required Braces or Orthoses: Splint/Cast, Spinal Brace Spinal Brace: Thoracolumbosacral orthotic (PRN) Restrictions Weight Bearing Restrictions: No   Therapy/Group: Individual Therapy  Breck Coons, PT, DPT 02/12/2022, 3:33 PM

## 2022-02-12 NOTE — Progress Notes (Signed)
Inpatient Rehabilitation Care Coordinator Discharge Note   Patient Details  Name: [name] MRN: [id number] Date of Birth: 1951/01/15   Discharge location: HOME WITH GRANDDAUGHTER WHO IS IN AND OUT DAUGHTER ALSO AT HER HOME RIGHT NOW BUT LIVES ALONE IN AN APARTMENT IN [place]  Length of Stay:  6 DAYS  Discharge activity level: MOD/I LEVEL  Home/community participation: ACTIVE  Patient response WH:QPRFFM Literacy - How often do you need to have someone help you when you read instructions, pamphlets, or other written material from your doctor or pharmacy?: Rarely  Patient response BW:GYKZLD Isolation - How often do you feel lonely or isolated from those around you?: Rarely  Services provided included: RD, MD, PT, OT, SLP, RN, CM, Pharmacy, SW  Financial Services:  Charity fundraiser Utilized: Private Insurance [facility] MEDICARE  Choices offered to/list presented to: PT  Follow-up services arranged:  Home Health, Patient/Family has no preference for HH/DME agencies, DME Home Health Agency: [place]    DME : [place] [facility]    Patient response to transportation need: Is the patient able to respond to transportation needs?: Yes In the past 12 months, has lack of transportation kept you from medical appointments or from getting medications?: Yes In the past 12 months, has lack of transportation kept you from meetings, work, or from getting things needed for daily living?: Yes    Comments (or additional information): PT DID WELL AND REACHED HER GOALS. PT WANTING TO GO HOME DUE TO STRESSED HELPS DAUGHTER WITH HER MEDICATIONS AND BILLS COMING DUE. TEAM AND MD AGREED AND FELT PT READY TO GO HOME TODAY. PT MAY STILL BE AT HIGH RISK TO FALL AT HOME AS SHE WAS PRIOR TO ADMISSION  Patient/Family verbalized understanding of follow-up arrangements:  Yes  Individual responsible for coordination of the follow-up plan: [facility]  [phone]  Confirmed correct DME delivered: Elease Hashimoto 02/12/2022    Dimitris Shanahan, [place]

## 2022-02-12 NOTE — Progress Notes (Signed)
Occupational Therapy Discharge Summary  Patient Details  Name: [name] MRN: [id number] Date of Birth: 1951/09/14  Today's Date: 02/12/2022 OT Individual Time: 1200-1220 OT Individual Time Calculation (min): 20 min   Pt seen briefly today to review discharge plans, HH recommendations, help pt find where to locate a button hook to purchase, showed pt a video of how to use button hook (did not have one to demo).   Pt education only.  No UE HEP provided as pt needs to just focus on using arms in ADLs until her fractures in thoracic spine heal more. Pt can do exercises under supervision of [place] therapies.   Patient has met 9 of 9 long term goals due to improved activity tolerance, improved balance, and ability to compensate for deficits.  Patient to discharge at overall Modified Independent level.  Patient's care partner is independent to provide the necessary physical assistance at discharge.    Reasons goals not met: n/a  Recommendation:  Patient will benefit from ongoing skilled OT services in home health setting to continue to advance functional skills in the area of iADL.  Equipment: Transfer tub bench  Reasons for discharge: treatment goals met  Patient/family agrees with progress made and goals achieved: Yes  OT Discharge Precautions/Restrictions  Precautions Precautions: Back;Fall Required Braces or Orthoses: Splint/Cast;Spinal Brace Spinal Brace: Thoracolumbosacral orthotic (PRN) Restrictions Weight Bearing Restrictions: No  Pain Pain Assessment Pain Scale: 0-10 Pain Score: 5  ADL ADL Eating: Independent Grooming: Independent Upper Body Bathing: Independent Where Assessed-Upper Body Bathing: Shower Lower Body Bathing: Modified independent Where Assessed-Lower Body Bathing: Shower Upper Body Dressing: Independent Where Assessed-Upper Body Dressing: Edge of bed Lower Body Dressing: Modified independent Where Assessed-Lower Body Dressing: Edge of  bed Toileting: Modified independent Where Assessed-Toileting: Glass blower/designer: Diplomatic Services operational officer Method: Counselling psychologist: Energy manager: Modified independent Clinical cytogeneticist Method: Optometrist: Gaffer Baseline Vision/History: 1 Wears glasses Patient Visual Report: No change from baseline Vision Assessment?: No apparent visual deficits Perception  Perception: Within Functional Limits Praxis Praxis: Intact Cognition Overall Cognitive Status: History of cognitive impairments - at baseline Arousal/Alertness: Awake/alert Orientation Level: Oriented X4 Year: 2023 Month: February Day of Week: Correct Focused Attention: Appears intact Sustained Attention: Appears intact Selective Attention: Appears intact Alternating Attention: Appears intact Memory: Impaired Immediate Memory Recall: Sock;Blue;Bed Memory Recall Sock: Without Cue Memory Recall Blue: Without Cue Memory Recall Bed: Without Cue Awareness: Appears intact Problem Solving: Appears intact Safety/Judgment: Appears intact Sensation Sensation Light Touch: Impaired by gross assessment (h/o B UE N/T) Hot/Cold: Appears Intact Proprioception: Appears Intact Stereognosis: Appears Intact Coordination Gross Motor Movements are Fluid and Coordinated: No Fine Motor Movements are Fluid and Coordinated: No Coordination and Movement Description: h/o tremors Finger Nose Finger Test: [facility] Heel Shin Test: [facility] Motor  Motor Motor: Abnormal postural alignment and control Motor - Skilled Clinical Observations: hx of balance disorder and tremors, but not apparent during eval and pt stated she is much better now that she is off of some medications. Motor - Discharge Observations: hx of balance disorder and tremors, but not apparent during eval and pt stated she is much better now that she is off of some medications. Mobility  Bed  Mobility Bed Mobility: Rolling Right;Rolling Left;Supine to Sit;Sit to Supine Rolling Right: Independent Rolling Left: Independent Supine to Sit: Independent Sit to Supine: Independent Transfers Sit to Stand: Independent;Independent with assistive device Stand to Sit: Independent;Independent with assistive device  Trunk/Postural Assessment  Cervical  Assessment Cervical Assessment: Within Functional Limits Thoracic Assessment Thoracic Assessment: Exceptions to [facility] Lumbar Assessment Lumbar Assessment: Exceptions to [facility] Postural Control Postural Control: Within Functional Limits  Balance Balance Balance Assessed: Yes Static Sitting Balance Static Sitting - Balance Support: Feet supported Static Sitting - Level of Assistance: 7: Independent Dynamic Sitting Balance Dynamic Sitting - Balance Support: Feet supported;During functional activity Dynamic Sitting - Level of Assistance: 7: Independent Static Standing Balance Static Standing - Balance Support: During functional activity;No upper extremity supported Static Standing - Level of Assistance: 7: Independent;6: Modified independent (Device/Increase time) Dynamic Standing Balance Dynamic Standing - Balance Support: During functional activity Dynamic Standing - Level of Assistance: 7: Independent;6: Modified independent (Device/Increase time) Extremity/Trunk Assessment  4-/5 - generalized pain with movement post T7 fracture     Rayshon Albaugh 02/12/2022, 1:24 PM

## 2022-02-12 NOTE — Discharge Instructions (Addendum)
Inpatient Rehab Discharge Instructions  [name]  Discharge date and time: 02/13/2022   Activities/Precautions/ Functional Status:  Activity: no lifting, driving, or strenuous exercise for until cleared by MD  Diet: diabetic diet  Wound Care: none needed  Functional status:  ___ No restrictions     ___ Walk up steps independently ___ 24/7 supervision/assistance   ___ Walk up steps with assistance ___ Intermittent supervision/assistance  ___ Bathe/dress independently ___ Walk with walker     ___ Bathe/dress with assistance ___ Walk Independently    ___ Shower independently ___ Walk with assistance    __x_ Shower with assistance _x__ No alcohol     ___ Return to work/school ________  Special Instructions:  No driving, alcohol consumption or tobacco use.   COMMUNITY REFERRALS UPON DISCHARGE:    Home Health:   PT & OT                  Agency: [facility] Phone: [phone]    Medical Equipment/Items Ordered: [place]                                                 Agency/Supplier:[facility]  [phone]  My questions have been answered and I understand these instructions. I will adhere to these goals and the provided educational materials after my discharge from the hospital.  Patient/Caregiver Signature _______________________________ Date __________  Clinician Signature _______________________________________ Date __________  Please bring this form and your medication list with you to all your follow-up doctor's appointments.

## 2022-02-12 NOTE — Progress Notes (Signed)
Physical Therapy Session Note  Patient Details  Name: [name] MRN: [id number] Date of Birth: 02-Apr-1951  Today's Date: 02/12/2022 PT Individual Time: 0800-0856 PT Individual Time Calculation (min): 56 min   Short Term Goals: Week 1:  PT Short Term Goal 1 (Week 1): STG=LTG based on ELOS  Skilled Therapeutic Interventions/Progress Updates:    Patient received sitting edge of bed, agreeable to PT. She reports 5/10 pain, premedicated. PT providing rest breaks, distractions and repositioning to assist with pain management. Patient voicing frustration for not being able to move around her room freely to use bathroom and get dressed. 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 PT communicated this to the team. MD okayed d/c tomorrow. Patient ambulating to the bathroom with no AD ModI. Morning ADLs completed standing at sink ModI. Patient requesting to wash up this AM. Set up A for sink-level bath. Patient dressing ModI as well. BP in sitting: 148/71. BP in standing: 117/64 with no s/s of OH. Discussed s/s of OH with patient and how to limit risk for falls related to this at home. Patient verbalized understanding. She ambulated 329ft with no AD and distant supervision. Patient is able to ambulate household distances and limited community distances with RW ModI. She returned to sitting edge of bed, bed alarm on, call light within reach.   Therapy Documentation Precautions:  Precautions Precautions: Back, Fall Precaution Booklet Issued: No Required Braces or Orthoses: Splint/Cast, Spinal Brace Spinal Brace: Thoracolumbosacral orthotic Restrictions Weight Bearing Restrictions: No    Therapy/Group: Individual Therapy  Karoline Caldwell, PT, DPT, CBIS  02/12/2022, 7:42 AM

## 2022-02-12 NOTE — Plan of Care (Signed)
Problem: RH Balance Goal: LTG Patient will maintain dynamic sitting balance (PT) Description: LTG:  Patient will maintain dynamic sitting balance with assistance during mobility activities (PT) Outcome: Completed/Met Goal: LTG Patient will maintain dynamic standing balance (PT) Description: LTG:  Patient will maintain dynamic standing balance with assistance during mobility activities (PT) Outcome: Completed/Met   Problem: Sit to Stand Goal: LTG:  Patient will perform sit to stand with assistance level (PT) Description: LTG:  Patient will perform sit to stand with assistance level (PT) Outcome: Completed/Met   Problem: RH Bed Mobility Goal: LTG Patient will perform bed mobility with assist (PT) Description: LTG: Patient will perform bed mobility with assistance, with/without cues (PT). Outcome: Completed/Met   Problem: RH Bed to Chair Transfers Goal: LTG Patient will perform bed/chair transfers w/assist (PT) Description: LTG: Patient will perform bed to chair transfers with assistance (PT). Outcome: Completed/Met   Problem: RH Car Transfers Goal: LTG Patient will perform car transfers with assist (PT) Description: LTG: Patient will perform car transfers with assistance (PT). Outcome: Completed/Met   Problem: RH Ambulation Goal: LTG Patient will ambulate in controlled environment (PT) Description: LTG: Patient will ambulate in a controlled environment, # of feet with assistance (PT). Outcome: Completed/Met Goal: LTG Patient will ambulate in home environment (PT) Description: LTG: Patient will ambulate in home environment, # of feet with assistance (PT). Outcome: Completed/Met Goal: LTG Patient will ambulate in community environment (PT) Description: LTG: Patient will ambulate in community environment, # of feet with assistance (PT). Outcome: Completed/Met   Problem: RH Stairs Goal: LTG Patient will ambulate up and down stairs w/assist (PT) Description: LTG: Patient will  ambulate up and down # of stairs with assistance (PT) Outcome: Completed/Met   

## 2022-02-12 NOTE — Progress Notes (Signed)
Patient ID: [name], female   DOB: 05-09-1951, 71 y.o.   MRN: [id number]  Pt requesting to discharge tomorrow due to concerns with daughter and needing to be at home. Team and MD are in agreement with her discharge tomorrow. Have ordered her DME and will arrange follow up pt has no preference.

## 2022-02-12 NOTE — Progress Notes (Signed)
Physical Therapy Discharge Summary  Patient Details  Name: [name] MRN: [id number] Date of Birth: [birth year]  Patient has met 10 of 10 long term goals due to improved activity tolerance, improved balance, improved postural control, increased strength, decreased pain, and ability to compensate for deficits.  Patient to discharge at an ambulatory level Modified Independent.   Patient's care partner unavailable to provide the necessary physical and cognitive assistance at discharge. Family did not participate in education prior to dc.    Recommendation:  Patient will benefit from ongoing skilled PT services in home health setting to continue to advance safe functional mobility, address ongoing impairments in dynamic balance, gait progressions, and minimize fall risk.  Equipment: RW  Reasons for discharge: treatment goals met and discharge from hospital  Patient/family agrees with progress made and goals achieved: Yes  PT Discharge Precautions/Restrictions Precautions Precautions: Back;Fall Required Braces or Orthoses: Splint/Cast;Spinal Brace Spinal Brace: Thoracolumbosacral orthotic (PRN) Restrictions Weight Bearing Restrictions: No Pain Pain Assessment Pain Scale: 0-10 Pain Score: 5  Pain Interference Pain Interference Pain Effect on Sleep: 2. Occasionally Pain Interference with Therapy Activities: 2. Occasionally Pain Interference with Day-to-Day Activities: 2. Occasionally Vision/Perception  Vision - History Ability to See in Adequate Light: 0 Adequate Perception Perception: Within Functional Limits Praxis Praxis: Intact  Cognition Overall Cognitive Status: History of cognitive impairments - at baseline Arousal/Alertness: Awake/alert Orientation Level: Oriented X4 Focused Attention: Appears intact Sustained Attention: Appears intact Selective Attention: Appears intact Alternating Attention: Appears intact Memory: Impaired Awareness: Appears  intact Problem Solving: Appears intact Safety/Judgment: Appears intact Sensation Sensation Light Touch: Impaired by gross assessment (h/o B UE N/T) Hot/Cold: Appears Intact Proprioception: Appears Intact Stereognosis: Appears Intact Coordination Gross Motor Movements are Fluid and Coordinated: No Fine Motor Movements are Fluid and Coordinated: No Coordination and Movement Description: h/o tremors Finger Nose Finger Test: [facility] Heel Shin Test: [facility] Motor  Motor Motor: Abnormal postural alignment and control Motor - Skilled Clinical Observations: hx of balance disorder and tremors, but not apparent during eval and pt stated she is much better now that she is off of some medications. Motor - Discharge Observations: hx of balance disorder and tremors, but not apparent during eval and pt stated she is much better now that she is off of some medications.  Mobility Bed Mobility Bed Mobility: Rolling Right;Rolling Left;Supine to Sit;Sit to Supine Rolling Right: Independent Rolling Left: Independent Supine to Sit: Independent Sit to Supine: Independent Transfers Transfers: Stand to Sit;Sit to Stand;Stand Pivot Transfers Sit to Stand: Independent;Independent with assistive device Stand to Sit: Independent;Independent with assistive device Stand Pivot Transfers: Independent;Independent with assistive device Transfer (Assistive device): Rolling walker Locomotion  Gait Ambulation: Yes Gait Assistance: Independent;Independent with assistive device Gait Distance (Feet): 350 Feet Assistive device: None;Rolling walker Gait Gait: Yes Gait Pattern: Impaired Gait Pattern: Step-through pattern;Decreased step length - right;Decreased step length - left;Trunk flexed Gait velocity: reduced Stairs / Additional Locomotion Stairs: Yes Stairs Assistance: Supervision/Verbal cueing Stair Management Technique: Two rails Number of Stairs: 12 Height of Stairs: 6 Curb: Independent with assistive  device Wheelchair Mobility Wheelchair Mobility: No  Trunk/Postural Assessment  Cervical Assessment Cervical Assessment: Within Functional Limits Thoracic Assessment Thoracic Assessment: Exceptions to [facility] Lumbar Assessment Lumbar Assessment: Exceptions to [facility] Postural Control Postural Control: Within Functional Limits  Balance Balance Balance Assessed: Yes Static Sitting Balance Static Sitting - Balance Support: Feet supported Static Sitting - Level of Assistance: 7: Independent Dynamic Sitting Balance Dynamic Sitting - Balance Support: Feet supported;During functional activity Dynamic Sitting -  Level of Assistance: 7: Independent Static Standing Balance Static Standing - Balance Support: During functional activity;No upper extremity supported Static Standing - Level of Assistance: 7: Independent;6: Modified independent (Device/Increase time) Dynamic Standing Balance Dynamic Standing - Balance Support: During functional activity Dynamic Standing - Level of Assistance: 7: Independent;6: Modified independent (Device/Increase time) Extremity Assessment      RLE Assessment RLE Assessment: Within Functional Limits LLE Assessment LLE Assessment: Within Functional Limits    [name] 02/12/2022, 9:38 AM

## 2022-02-12 NOTE — Progress Notes (Signed)
Inpatient Rehabilitation Discharge Medication Review by a Pharmacist  A complete drug regimen review was completed for this patient to identify any potential clinically significant medication issues.  High Risk Drug Classes Is patient taking? Indication by Medication  Antipsychotic No   Anticoagulant No   Antibiotic No   Opioid Yes OxyIR- acute pain  Antiplatelet Yes Aspirin- CVA prophylaxis  Hypoglycemics/insulin No   Vasoactive Medication No   Chemotherapy No   Other Yes Wellbutrin- MDD Repatha- HLD Gabapentin- neuropathic pain Synthroid- hypothyroidism Protonix- GERD Robaxin- muscle spasms     Type of Medication Issue Identified Description of Issue Recommendation(s)  Drug Interaction(s) (clinically significant)     Duplicate Therapy     Allergy     No Medication Administration End Date     Incorrect Dose     Additional Drug Therapy Needed     Significant med changes from prior encounter (inform family/care partners about these prior to discharge).    Other       Clinically significant medication issues were identified that warrant physician communication and completion of prescribed/recommended actions by midnight of the next day:  No  Time spent performing this drug regimen review (minutes):  30   Neomia Herbel BS, PharmD, BCPS Clinical Pharmacist 02/12/2022 10:21 AM

## 2022-02-13 LAB — GLUCOSE, CAPILLARY: Glucose-Capillary: 157 mg/dL — ABNORMAL HIGH (ref 70–99)

## 2022-02-13 MED ORDER — ASPIRIN 325 MG PO TBEC: 325.0000 mg | DELAYED_RELEASE_TABLET | Freq: Every day | ORAL | 0 refills | Status: DC

## 2022-02-13 MED ORDER — CALAMINE EX LOTN: 1.0000 "application " | TOPICAL_LOTION | CUTANEOUS | 0 refills | Status: DC | PRN

## 2022-02-13 MED ORDER — SENNA 8.6 MG PO TABS
1.0000 | ORAL_TABLET | Freq: Two times a day (BID) | ORAL | 0 refills | Status: DC
Start: 2022-02-13 — End: 2022-06-04

## 2022-02-13 MED ORDER — GABAPENTIN 100 MG PO CAPS: 100.0000 mg | ORAL_CAPSULE | Freq: Three times a day (TID) | ORAL | 0 refills | Status: DC

## 2022-02-13 MED ORDER — OXYCODONE HCL 5 MG PO TABS: 2.5000 mg | ORAL_TABLET | ORAL | 0 refills | Status: DC | PRN

## 2022-02-13 MED ORDER — JUVEN PO PACK: 1.0000 | PACK | Freq: Two times a day (BID) | ORAL | 0 refills | Status: DC

## 2022-02-13 MED ORDER — GABAPENTIN 100 MG PO CAPS: 100.0000 mg | ORAL_CAPSULE | Freq: Two times a day (BID) | ORAL | 0 refills | Status: DC

## 2022-02-13 MED ORDER — POLYETHYLENE GLYCOL 3350 17 G PO PACK: 17.0000 g | PACK | Freq: Every day | ORAL | 0 refills | Status: DC | PRN

## 2022-02-13 MED ORDER — ENSURE MAX PROTEIN PO LIQD
11.0000 [oz_av] | Freq: Two times a day (BID) | ORAL | Status: DC
Start: 2022-02-13 — End: 2022-06-04

## 2022-02-13 MED ORDER — BUPROPION HCL 100 MG PO TABS: 100.0000 mg | ORAL_TABLET | Freq: Two times a day (BID) | ORAL | 0 refills | Status: DC

## 2022-02-13 NOTE — Progress Notes (Addendum)
PROGRESS NOTE   Subjective/Complaints: She asks great questions about her pain, medications, follow-ups, the relation between her prior compression fracture and current ones  ROS:  Pt denies SOB, abd pain, CP, N/V/C/D, and vision changes, +pain from compression fracture   Objective:   No results found. No results for input(s): WBC, HGB, HCT, PLT in the last 72 hours. No results for input(s): NA, K, CL, CO2, GLUCOSE, BUN, CREATININE, CALCIUM in the last 72 hours.  Intake/Output Summary (Last 24 hours) at 02/13/2022 1034 Last data filed at 02/13/2022 0758 Gross per 24 hour  Intake 758 ml  Output --  Net 758 ml        Physical Exam: Vital Signs Blood pressure 133/76, pulse (!) 101, temperature 97.8 F (36.6 C), resp. rate 20, SpO2 100 %.     General: awake, alert, appropriate  HENT: conjugate gaze; periorbital ecchymosis. CV: tachycardia Pulmonary: CTA B/L; no W/R/R- good air movement GI: soft, NT, ND, (+)BS Psychiatric: appropriate Neurological/MSK: Alert Intention tremors appear to be improving Standing at sink independently  Assessment/Plan: 1. Functional deficits which require 3+ hours per day of interdisciplinary therapy in a comprehensive inpatient rehab setting. Physiatrist is providing close team supervision and 24 hour management of active medical problems listed below. Physiatrist and rehab team continue to assess barriers to discharge/monitor patient progress toward functional and medical goals  Care Tool:  Bathing    Body parts bathed by patient: Right arm, Left arm, Chest, Abdomen, Front perineal area, Buttocks, Face, Left lower leg, Right lower leg, Left upper leg, Right upper leg         Bathing assist Assist Level: Independent     Upper Body Dressing/Undressing Upper body dressing   What is the patient wearing?: Pull over shirt, Bra    Upper body assist Assist Level: Independent     Lower Body Dressing/Undressing Lower body dressing      What is the patient wearing?: Underwear/pull up, Pants     Lower body assist Assist for lower body dressing: Independent     Toileting Toileting    Toileting assist Assist for toileting: Independent     Transfers Chair/bed transfer  Transfers assist     Chair/bed transfer assist level: Independent with assistive device Chair/bed transfer assistive device: Museum/gallery exhibitions officer assist      Assist level: Supervision/Verbal cueing Assistive device: Walker-rolling Max distance: 350   Walk 10 feet activity   Assist     Assist level: Independent with assistive device Assistive device: Walker-rolling   Walk 50 feet activity   Assist    Assist level: Independent with assistive device Assistive device: Walker-rolling    Walk 150 feet activity   Assist Walk 150 feet activity did not occur: Safety/medical concerns  Assist level: Independent with assistive device Assistive device: Walker-rolling    Walk 10 feet on uneven surface  activity   Assist Walk 10 feet on uneven surfaces activity did not occur: Safety/medical concerns         Wheelchair     Assist Is the patient using a wheelchair?: No             Wheelchair  50 feet with 2 turns activity    Assist            Wheelchair 150 feet activity     Assist          Blood pressure 133/76, pulse (!) 101, temperature 97.8 F (36.6 C), resp. rate 20, SpO2 100 %.    Medical Problem List and Plan: 1. Functional deficits secondary to T7 compression fracture status post fall          d/c home today 2.  Impaired mobility, ambulating 300 feet, may discontinue Lovenox             -antiplatelet therapy: Aspirin 325 mg daily 3. Pain Management: Robaxin, oxycodone as needed, lidocaine patch as needed  Gabapentin 100 twice daily restarted on 2/25 per patient request, can increase to TID as per  patient's request.   Decrease oxycodone to 2.5mg  q4H prn.   Messaged [facility] to schedule TC with Zella Ball next week.  4. Depression: LCSW to evaluate and provide emotional support             --Wellbutrin decreased to 100 bid mg on 2/25 as patient states this was recently changed as an outpatient 5. Neuropsych: This patient is capable of making decisions on her own behalf. 6. Skin/Wound Care: Routine skin care checks             -- forehead laceration, hematoma: Monitor. Sutures are absorable 7. Fluids/Electrolytes/Nutrition: Routine I's and O's and follow-up chemistries 8.  Diabetes mellitus with hyperglycemia: CBGs and sliding scale insulin, carb modified diet             -- Does not appear she was on home medications  2/23- CBGs 140s-200s- A1c 7.4-  2/24- pt allergic to 31 meds- many of them DM meds- CBGs 160s-170s- doesn't want ot add meds right now- didn't take anything at home  Remains elevated on 2/25. Provide dietary education.  9.  Hypertension: On Bumex at home, currently held Controlled on 2/25 10: Hyperlipidemia: On Repatha at home q 14 days 11: Insomnia: melatonin not effective 12: Pernicious anemia: Normal H&H and normal red blood cell indices. Continue B 12 injection monthly 13: Hypothyroidism: Continue Synthroid 50 mcg every morning 14: Moderate malnutrition secondary to chronic illness: Follow-up albumin, total protein in tomorrow morning's labs  2/23- Albumin 2.7- not bad- but was ~ 4.0- total protein also droppe-d will d/w pt.  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 Constipation in setting of chronic diarrhea  2/23- LBM 6 days ago- will try prune juice, but if not, will order sorbitol at 5pm if no BM  2/24- Sorbitol 15cc ordered for 3pm if no BM 20. Osteopenia: reviewed DEXA results with her.  21. AKI: recommended 6-8 glasses of water per day   >30  minutes spent in discharge of patient including review of medications and follow-up appointments, physical examination, and in answering all patient's questions  LOS: 6 days A FACE TO FACE EVALUATION WAS [place] 02/13/2022, 10:34 AM

## 2022-02-15 ENCOUNTER — Telehealth: Payer: Self-pay | Admitting: *Deleted

## 2022-02-15 MED ORDER — ADULT MULTIVITAMIN W/MINERALS CH: 1.0000 | ORAL_TABLET | Freq: Every day | ORAL | 0 refills | Status: AC

## 2022-02-15 NOTE — Telephone Encounter (Signed)
Transitional Care call--[name] ? ? ? ?Are you/is patient experiencing any problems since coming home? Are there any questions regarding any aspect of care? NO ?Are there any questions regarding medications administration/dosing? Are meds being taken as prescribed? Patient should review meds with caller to confirm She reports she has medications except was aking for the MVI to be sent to the pharmacy. I tried to send it electronically but it would not be sent due to not being covered by insurance. ?Have there been any falls? NO ?Has Home Health been to the house and/or have they contacted you? If not, have you tried to contact them? Can we help you contact them? She has made contact but they have not come out yet ?Are bowels and bladder emptying properly? Are there any unexpected incontinence issues? If applicable, is patient following bowel/bladder programs?Yes ?Any fevers, problems with breathing, unexpected pain? Trying to only take 1/2 tablet daily. Her appt has been moved to 3 weeks out and she will need to call office for refill when has about 3 days left.  ?Are there any skin problems or new areas of breakdown? NO ?Has the patient/family member arranged specialty MD follow up (ie cardiology/neurology/renal/surgical/etc)?  Can we help arrange?  She has the information. Appts have been made to see NP in our office and Dr Aurelio Brash July 24 2022. ?Does the patient need any other services or support that we can help arrange? NO ?Are caregivers following through as expected in assisting the patient? YES ?Has the patient quit smoking, drinking alcohol, or using drugs as recommended? YES ? ?Appointment 03/05/22 Monday @ 2:20 pm arrive by 2 pm to see Danella Sensing NP ?Alerted to watch for packet in mail from our office. She is pt of [facility] so familiar with our address ?[place]

## 2022-02-20 ENCOUNTER — Encounter: Payer: Medicare Other | Admitting: Registered Nurse

## 2022-02-21 ENCOUNTER — Other Ambulatory Visit: Payer: Self-pay | Admitting: Family Medicine

## 2022-02-21 DIAGNOSIS — Z13820 Encounter for screening for osteoporosis: Secondary | ICD-10-CM

## 2022-02-21 DIAGNOSIS — Z1231 Encounter for screening mammogram for malignant neoplasm of breast: Secondary | ICD-10-CM

## 2022-02-22 ENCOUNTER — Ambulatory Visit: Payer: Medicaid Other | Admitting: Family Medicine

## 2022-02-23 NOTE — Progress Notes (Signed)
Assessment/Plan:   Tremor Her tremor is really more consistent with essential tremor, which she has been diagnosed with for 10 or 15 years. Talked the patient about nature and pathophysiology of essential tremor. Patient reports tremor is bad enough to require medication.  It does not sound like she has ever been on medication for essential tremor.  She is very poorly tolerant of "all" medications (reports allergies to "all" diabetic medications), so we decided to start with very low-dose primidone.  She will start primidone, 50 mg, half tablet at bedtime for 1 week and then increase to 1 tablet at bedtime thereafter.  Risk, benefits, side effects discussed.   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  Even though she does have a movement disorder (essential tremor) I am not clearly convinced of parkinsonism.  She has had a normal DaTscan in the past, which I reviewed.   The diagnosis of Fahr's does not require genetic testing, although it is thought to be inherited in an autosomal dominant fashion.  I am not even sure that genetic testing is widely available for this.  I have called several labs, and testing is not available.  I will continue to look around regarding that.  However, her radiographic findings have not really changed since 2010, and I would expect if she had Fahr's disease that this would have progressed by now.  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  She was given low-dose levodopa at [facility], half tablet twice per day.  She only took it 1 day.  I really do not see anything clinically that levodopa would help and we decided to hold off on that for right now.  3.  Gait instability  -I think that this is all likely secondary to diabetic pn.  She is currently not on any diabetic medications because she reports allergies to all of  them.  Patient reports diabetes for about 15 years.  She and I discussed that physical therapy can be valuable for the gait instability associated with peripheral neuropathy.  She is getting physical therapy started soon.  We also discussed control of diabetes.  We also discussed that this is likely the source of her hand paresthesias.  She has had an EMG in the past confirming neuropathy.  4.  Headache, with long-term history of migraine  -declines referral to HA clinic.  -Headache slightly worsened after she went off of amitriptyline (which caused dizziness)  Subjective:   [name] was seen today in the movement disorders clinic for neurologic consultation at the request of Allwardt, Alyssa M, PA-C.  The consultation is for the evaluation of tremor.  Patient has previously seen several neurologists, including Dr. Jaynee Eagles and Dr. Buck Mam.  Records reviewed.  Started seeing Dr. Jaynee Eagles in 2016 for memory change.  Came back for evaluation in 2020 for basal ganglia calcifications and long-term history of essential tremor.  Dr. Jaynee Eagles noted that she had some features of Parkinson's.  DaTscan was ordered.  DaTscan was completed November, 2020.  Was reported to show mild decrease in the left striata compared to that of the right, but "findings favored to be within normal limits."  Following this, Dr. Jaynee Eagles sent the patient to [facility] for further evaluation.  It was felt she needed genetic testing for Fahr's disease.  She was first seen at [facility] by Dr. Buck Mam in April, 2021.  Dr. Buck Mam felt patient had voice and head tremor consistent with essential tremor.  She did not feel that the patient had parkinsonism at the time.  She did not feel that the patient had Fahr's disease, and did not recommend genetic testing.  She recommended vestibular therapy, physical therapy and ENT follow-up for her hearing loss and history of petrous apex lesion.  Her only other visit with Dr. Buck Mam was  recently, on January 17, 2022.  It was still felt at that time that her tremor was consistent with essential tremor.  However, Dr. Buck Mam did start carbidopa/levodopa 25/100, half tablet twice per day to see if that would help with possible secondary parkinsonism.  She did state that she would look into the cost of genetic testing for Fahr's disease.  Of note, patient's PTH has been normal.  Her calcium has been normal.  Heavy metals have been normal.  Pt states today that she only took the 1/2 po bid of carbidopa/levodopa on one day as she wanted to be seen here today.  Tremor: Yes.     How long has it been going on? 10 to 15 years (diagnosis of essential tremor)  At rest or with activation? Both per patient  When is it noted the most?  "All the time but its intermittent"  Fam hx of tremor?  Yes.  , son has tremor  Located where?  Bilateral UE; she is R hand dominant but both shake equally  Affected by caffeine:  No.  Affected by alcohol:  doesn't drink alcohol  Affected by stress:  a little bit increased  Affected by fatigue:  No.  Spills soup if on spoon:  Yes.    Spills glass of liquid if full:  Yes.    Affects ADL's (tying shoes, brushing teeth, etc):  No.  Tremor inducing meds:  No.  Other Specific Symptoms:  Voice: tremor in voice; otherwise no change in voice Sleep: been on elavil for migraine for 50 years - just went off of it 2 months ago b/c it was causing dizziness and falls  Vivid Dreams:  No.  Acting out dreams:  No. Wet Pillows: Yes.   Postural symptoms:  Yes.    Falls?  Yes.   Recent T7 compression fx b/c f fall; numerous other falls - many related to elavil and they are "50% better" now that it has been discontinued.  She will start PT next week.  Has started using walker since T7 compression fx end of feb.   Bradykinesia symptoms: shuffling gait Loss of smell:  Yes.   Loss of taste:  Yes.   Urinary Incontinence:  has "leakage" or urine from prolapsed  vagina Difficulty Swallowing:  No. Lightheaded:  better with d/c of elavil Diplopia:  No. Some blurry vision Dyskinesia:  No.  I have reviewed a number of the patient's CTs brains over the years.  She has very extensive bilateral basal ganglia calcifications as well as calcifications in the deep cerebellar hemispheres/cerebellar nuclei.  I am not convinced that this has changed extensively over the years from 2020 to current.  She even had a scan in 2010 that I reviewed and calcifications were present back then.  C/o headaches.   States that she has had headaches all her life but had to go off of elavil b/c of falls.    PREVIOUS MEDICATIONS: none to date  ALLERGIES:   Allergies  Allergen Reactions   Atenolol     Stomach problems, chills   Atorvastatin  Other reaction(s): retain fld   Canagliflozin     ALL DIABETIC MEDS PER PT   abdominal pain, headache   Losartan Potassium     chills, HA, stomach pain   Acarbose Nausea Only     headache,nausea   Cinnamon     Stomach pain   Empagliflozin      headaches   Glimepiride     ALL DIABETIC MEDS PER PT  abdominal pain, headache   Metformin And Related Nausea And Vomiting and Other (See Comments)    Patient states that she has severe chills, headache and cramping additionally. Says blood sugar is uncontrolled because of this   Other Other (See Comments)    ALL DIABETIC MEDS PER PT    Pioglitazone Other (See Comments)    ALL DIABETIC MEDS PER PT  Other reaction(s): peripheral edema   Pravastatin     Other reaction(s): retain fld   Sitagliptin     ALL DIABETIC MEDS PER PT  Other reaction(s): headache   Spironolactone-Hctz Other (See Comments)   Statins     Myalgia   Sulfamethoxazole Other (See Comments)    ALL DIABETIC MEDS PER PT    Tramadol Hcl Itching   Acarbose Other (See Comments)   Atenolol Other (See Comments)   Atorvastatin Other (See Comments)   Canagliflozin Other (See Comments)   Crestor [Rosuvastatin] Other  (See Comments)   Empagliflozin Other (See Comments)   Glimepiride Other (See Comments)   Losartan Potassium Other (See Comments)   Metformin Other (See Comments)   Other Other (See Comments)   Pioglitazone Other (See Comments)   Pravastatin Other (See Comments)   Spironolactone-Hctz Other (See Comments)   Sulfa Antibiotics Rash and Other (See Comments)    Mother has told patient in the past not to take, she cannot recall there reaction    CURRENT MEDICATIONS:  Current Outpatient Medications  Medication Instructions   acetaminophen (TYLENOL) 500 mg, Oral, Every 6 hours PRN   aspirin 325 mg, Oral, Daily   bumetanide (BUMEX) 1 mg, Oral, Every morning   buPROPion (WELLBUTRIN) 100 mg, Oral, 2 times daily   calamine lotion 1 application., Topical, As needed   cholecalciferol (VITAMIN D3) 1,000 Units, Oral, 2 times weekly   cyanocobalamin ((VITAMIN B-12)) 1,000 mcg, Intramuscular, Every 30 days   diphenoxylate-atropine (LOMOTIL) 2.5-0.025 MG tablet TAKE 1 TABLET BY MOUTH TWICE DAILY AS NEEDED FOR  DIARRHEA  OR  LOOSE  STOOLS   Ensure Max Protein (ENSURE MAX PROTEIN) LIQD 11 oz, Oral, 2 times daily   estradiol (ESTRACE) 0.5 g, Vaginal, 2 times weekly, Place 0.5g nightly for two weeks then twice a week after   Evolocumab (REPATHA SURECLICK) 315 MG/ML SOAJ INJECT 1 PEN SUBCUTANEOUSLY EVERY 14 DAYS   gabapentin (NEURONTIN) 100 mg, Oral, 3 times daily   levothyroxine (SYNTHROID) 50 mcg, Oral, Daily   MAGNESIUM PO Oral   Multiple Vitamin (MULTIVITAMIN WITH MINERALS) TABS tablet 1 tablet, Oral, Daily   nutrition supplement, JUVEN, (JUVEN) PACK 1 packet, Oral, 2 times daily between meals   oxyCODONE (OXY IR/ROXICODONE) 2.5 mg, Oral, Every 4 hours PRN   pantoprazole (PROTONIX) 40 mg, Oral, Daily   polyethylene glycol (MIRALAX / GLYCOLAX) 17 g, Oral, Daily PRN   Potassium 198 mg, Oral, Daily   senna (SENOKOT) 8.6 mg, Oral, 2 times daily    Objective:   PHYSICAL EXAMINATION:    VITALS:    Vitals:   02/26/22 1302  BP: (!) 157/93  Pulse: 96  SpO2: 99%  Weight: 175 lb 6.4 oz (79.6 kg)  Height: '5\' 2"'$  (1.575 m)    GEN:  The patient appears stated age and is in NAD. HEENT:  Normocephalic, atraumatic.  The mucous membranes are moist. The superficial temporal arteries are without ropiness or tenderness. CV:  RRR Lungs:  CTAB Neck/HEME:  There are no carotid bruits bilaterally.  Neurological examination:  Orientation: The patient is alert and oriented x3.  Cranial nerves: There is mild right facial droop.  Extraocular muscles are intact. The visual fields are full to confrontational testing. The speech is fluent and clear. Soft palate rises symmetrically and there is no tongue deviation. Hearing is intact to conversational tone. Sensation: Sensation is intact to light touch throughout (facial, trunk, extremities). Vibration is decreased distally. There is no extinction with double simultaneous stimulation.  Motor: Strength is 5/5 in the bilateral upper and lower extremities.   Shoulder shrug is equal and symmetric.  There is no pronator drift. Deep tendon reflexes: Deep tendon reflexes are 2-/4 at the bilateral biceps, triceps, brachioradialis, patella and achilles. Plantar responses are downgoing bilaterally.  Movement examination: Tone: There is nl tone in the bilateral upper extremities.  The tone in the lower extremities is nl.  Abnormal movements: there is rare rest tremor on the LUE that doesn't increase with distraction.  No postural tremor.  There is some head tremor.  There is mild intention tremor.  Mild trouble with archimedes spirals.   Coordination:  There is no decremation with RAM's, with any form of RAMS, including alternating supination and pronation of the forearm, hand opening and closing, finger taps, heel taps and toe taps. Gait and Station: The patient has mild difficulty arising out of a deep-seated chair without the use of the hands. The patient's stride  length is good with the walker.  Without the walker, she is tenuous and wide based but not shuffling or short stepped.   I have reviewed and interpreted the following labs independently   Chemistry      Component Value Date/Time   NA 138 02/08/2022 0621   NA 141 04/12/2020 1347   K 4.1 02/08/2022 0621   CL 103 02/08/2022 0621   CO2 27 02/08/2022 0621   BUN 29 (H) 02/08/2022 0621   BUN 20 04/12/2020 1347   CREATININE 0.99 02/08/2022 0621      Component Value Date/Time   CALCIUM 9.0 02/08/2022 0621   ALKPHOS 46 02/08/2022 0621   AST 19 02/08/2022 0621   ALT 15 02/08/2022 0621   BILITOT 0.6 02/08/2022 0621   BILITOT 0.6 08/16/2021 1018      Lab Results  Component Value Date   TSH 0.79 12/28/2021   Lab Results  Component Value Date   WBC 4.0 02/08/2022   HGB 10.5 (L) 02/08/2022   HCT 31.0 (L) 02/08/2022   MCV 89.6 02/08/2022   PLT 268 02/08/2022   Lab Results  Component Value Date   HGBA1C 7.4 (H) 12/28/2021      Total time spent on today's visit was 60mnutes, including both face-to-face time and nonface-to-face time.  Time included that spent on review of records (prior notes available to me/labs/imaging if pertinent), discussing treatment and goals, answering patient's questions and coordinating care.  Cc:  Rankins, VBill Salinas MD

## 2022-02-26 ENCOUNTER — Other Ambulatory Visit: Payer: Self-pay

## 2022-02-26 ENCOUNTER — Ambulatory Visit (INDEPENDENT_AMBULATORY_CARE_PROVIDER_SITE_OTHER): Payer: Medicare Other | Admitting: Neurology

## 2022-02-26 ENCOUNTER — Encounter: Payer: Self-pay | Admitting: Neurology

## 2022-02-26 VITALS — BP 157/93 | HR 96 | Ht 62.0 in | Wt 175.4 lb

## 2022-02-26 DIAGNOSIS — G43009 Migraine without aura, not intractable, without status migrainosus: Secondary | ICD-10-CM | POA: Diagnosis not present

## 2022-02-26 DIAGNOSIS — G250 Essential tremor: Secondary | ICD-10-CM | POA: Diagnosis not present

## 2022-02-26 DIAGNOSIS — R9402 Abnormal brain scan: Secondary | ICD-10-CM

## 2022-02-26 MED ORDER — PRIMIDONE 50 MG PO TABS: 50.0000 mg | ORAL_TABLET | Freq: Every day | ORAL | 1 refills | Status: DC

## 2022-02-26 NOTE — Patient Instructions (Addendum)
Start primidone 50 mg - 1/2 tablet at bedtime for 1 week and then increase to 1 tablet at bedtime thereafter. ? ?DO NOT TAKE YOUR carbidopa/levodopa for now. ? ?I think that the balance and tingling are mostly due to diabetic peripheral neuropathy.  You need to use your walker at all times.  The physical therapy will help with this as well, and once its completed, you will need to find an exercise program.  Control of the diabetes/blood sugar is integral. ? ?The physicians and staff at [facility] are committed to providing excellent care. You may receive a survey requesting feedback about your experience at our office. We strive to receive "very good" responses to the survey questions. If you feel that your experience would prevent you from giving the office a "very good " response, please contact our office to try to remedy the situation. We may be reached at [phone]. Thank you for taking the time out of your busy day to complete the survey. ? ?

## 2022-02-27 ENCOUNTER — Ambulatory Visit (INDEPENDENT_AMBULATORY_CARE_PROVIDER_SITE_OTHER): Payer: Medicare Other | Admitting: *Deleted

## 2022-02-27 DIAGNOSIS — E538 Deficiency of other specified B group vitamins: Secondary | ICD-10-CM | POA: Diagnosis not present

## 2022-02-27 MED ORDER — CYANOCOBALAMIN 1000 MCG/ML IJ SOLN
1000.0000 ug | Freq: Once | INTRAMUSCULAR | Status: AC
  Administered 2022-02-27: 1000 ug via INTRAMUSCULAR

## 2022-02-27 NOTE — Progress Notes (Signed)
Per orders of Alyssa Allwardt, PA-C, injection of monthly B 12 given in right deltoid per patient preference by Zacarias Pontes, CMA. Patient tolerated injection well.  ?

## 2022-03-05 ENCOUNTER — Encounter: Payer: Medicare Other | Attending: Registered Nurse | Admitting: Registered Nurse

## 2022-03-05 ENCOUNTER — Other Ambulatory Visit: Payer: Self-pay

## 2022-03-05 ENCOUNTER — Encounter: Payer: Self-pay | Admitting: Registered Nurse

## 2022-03-05 VITALS — BP 154/88 | HR 92 | Ht 62.0 in | Wt 177.2 lb

## 2022-03-05 DIAGNOSIS — M792 Neuralgia and neuritis, unspecified: Secondary | ICD-10-CM | POA: Diagnosis present

## 2022-03-05 DIAGNOSIS — I10 Essential (primary) hypertension: Secondary | ICD-10-CM | POA: Diagnosis present

## 2022-03-05 DIAGNOSIS — R42 Dizziness and giddiness: Secondary | ICD-10-CM | POA: Diagnosis present

## 2022-03-05 MED ORDER — GABAPENTIN 100 MG PO CAPS: 100.0000 mg | ORAL_CAPSULE | Freq: Three times a day (TID) | ORAL | 0 refills | Status: DC

## 2022-03-05 NOTE — Progress Notes (Signed)
? ?Subjective:  ? ? Patient ID: [name], female    DOB: 1951-01-09, 71 y.o.   MRN: [id number] ? ?HPI: [name] is a 71 y.o. female who returns for hospital follow up appointment of her T7 Vertebral Fracture, Essential Hypertension, Neuropathic Pain and Dizziness.  ?She presented to [facility] on 01/30/2022 with complaints of pain after a fall, she was transferred to [facility].  ? ?Dr Lorin Mercy: H&P ?HPI: [name] is a 71 y.o. female with medical history significant of DM, HTN, dementia, and pernicious anemia presenting with a fall.  She has been sick for a long time.  She went off amitriptyline for migraines and was 50% less dizzy and so wasn't falling TID.  She took some brain herbal for 5 days and it started making her dizzy (less than amitriptyline).  She came into the house and tripped.   She tried to break her fall with her hand.  Both hands are hurting severely.  She hasn't been able to sleep off the amitriptyline.  She is hurting in her shoulders.  She saw a neurosurgeon 8 months ago and "a compressed something" and there was a question about FARS.  She has Parkinson's-type symptoms.  She is having tremors at baseline.  She thinks she is not a candidate for surgery - elderly, anesthesia complications, calcifications in her brain.  She has decided she wants to see someone in [place]. ?  ?CT Head: WO Contrast: CT Cervical Spine ?IMPRESSION: ?1. No acute intracranial abnormality. ?2. Small right frontal scalp hematoma without skull fracture. ?3. No acute fracture or static subluxation of the cervical spine. ? ?CT Thoracic Spine ?IMPRESSION: ?1. No acute fracture or static subluxation of the thoracic spine. ?2. Chronic compression deformity of L1. ?  ?Aortic Atherosclerosis (ICD10-I70.0). ?  ? ?MR Cervical Spine ?IMPRESSION: ?1. Mild T7 compression fracture with a small amount of bone marrow ?edema, compatible with acute to subacute injury. Minimal height ?loss. ?2. 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 ?3. Severe bilateral neural foraminal stenosis at C5-6. ?  ?MR Thoracic Spine:  ?IMPRESSION: ?1. Mild T7 compression fracture with a small amount of bone marrow ?edema, compatible with acute to subacute injury. Minimal height ?loss. ?2. Multilevel degenerative disc disease of the cervical spine with ?moderate C5-6 and mild C4-5 spinal canal stenosis. ?3. Severe bilateral neural foraminal stenosis at C5-6. ?  ?DG: Left Hand ?  ?IMPRESSION: ?1. Widening of the Scapholunate interval compatible with acute or ?chronic ligamentous injury which can predispose to SLAC wrist. ?2. No acute fracture or dislocation identified about the left hand. ?3. 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 ?4. Calcified peripheral vascular disease. ? ?[name] was admitted to inpatient rehabilitation on 02/07/2022 and discharged home on 02/01/24/2022. She will be attending outpatient therapy.  ?She states she has pain in her neck radiating into her bilateral shoulders, left arm with tingling and lower back pain. She is walking with her walker.  ?  ? ? ?Pain Inventory ?Average Pain 4 ?Pain Right Now 4 ?My pain is intermittent, dull, tingling, and aching ? ?In the last 24 hours, has pain interfered with the following? ?General activity 3 ?Relation with others 0 ?Enjoyment of life 2 ?What TIME of day is your pain at its worst? morning , daytime, evening, and night ?Sleep (in general) Fair ? ?Pain is worse with: some activites ?Pain improves with: medication ?Relief  from Meds: 5 ? ?use a walker ?how many minutes can you walk? several ?ability to climb steps?  yes ?do you drive?  yes ?Do you have any goals in this area?  no ? ?retired ?Do you have any goals in this area?  no ? ?bladder control problems ?bowel control problems ?weakness ?numbness ?tremor ?tingling ?dizziness ?anxiety ? ?Any changes since last visit?  no ? ?Any  changes since last visit?  no ? ? ? ?Family History  ?Problem Relation Age of Onset  ? Anesthesia problems Mother   ?     hard to wake post op   ? Cerebrovascular Disease Mother   ?     Never had stroke, but had CEA.  ? Liver cancer Father   ?     mets to liver   ? Bladder Cancer Father   ? Colon cancer Father   ?     mets to colon   ? Schizophrenia Daughter   ?     Schizoaffective disorder  ? Anesthesia problems Son   ?     stopped breathing post op   ? Tremor Son   ? Anesthesia problems Granddaughter   ?     PONV  ? High blood pressure Other   ? Diabetes Other   ? Arthritis Other   ? Schizophrenia Child   ? Tremor Child   ? Dementia Neg Hx   ? Esophageal cancer Neg Hx   ? Rectal cancer Neg Hx   ? Stomach cancer Neg Hx   ? Colon polyps Neg Hx   ? ?Social History  ? ?Socioeconomic History  ? Marital status: Legally Separated  ?  Spouse name: Not on file  ? Number of children: 3  ? Years of education: 55  ? Highest education level: Not on file  ?Occupational History  ? 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 Occupation: retired  ?Tobacco Use  ? Smoking status: Never  ? Smokeless tobacco: Never  ?Vaping Use  ? Vaping Use: Never used  ?Substance and Sexual Activity  ? Alcohol use: Never  ?  Comment: quit 1978  ? Drug use: Never  ? Sexual activity: Not Currently  ?Other Topics Concern  ? Not on file  ?Social History Narrative  ?  Lives at home. Her granddaughter lives with her and daughter   ? Right handed  ? ?Social Determinants of Health  ? ?Financial Resource Strain: Not on file  ?Food Insecurity: Not on file  ?Transportation Needs: Not on file  ?Physical Activity: Not on file  ?Stress: Not on file  ?Social Connections: Not on file  ? ?Past Surgical History:  ?Procedure Laterality Date  ? CATARACT EXTRACTION Right   ? CHOLECYSTECTOMY  1983  ? COLONOSCOPY  2008  ? DB  ? [place]  ? HYSTEROSCOPY WITH D & C  06/29/2011  ? Procedure: DILATATION AND CURETTAGE (D&C) /HYSTEROSCOPY;  Surgeon:  Donnamae Jude, MD;  Location: [facility];  Service: Gynecology;  Laterality: N/A;  ? ROBOTIC ASSISTED TOTAL HYSTERECTOMY WITH BILATERAL SALPINGO OOPHERECTOMY N/A 10/27/2020  ? Procedure: XI ROBOTIC ASSISTED TOTAL HYSTERECTOMY WITH BILATERAL SALPINGO OOPHORECTOMY;  Surgeon: Everitt Amber, MD;  Location: [facility];  Service: Gynecology;  Laterality: N/A;  ? SENTINEL NODE BIOPSY N/A 10/27/2020  ? Procedure: SENTINEL NODE BIOPSY;  Surgeon: Everitt Amber, MD;  Location: [facility];  Service: Gynecology;  Laterality: N/A;  ? ?Past Medical History:  ?Diagnosis Date  ?  Allergy   ? Anemia   ? Arthritis   ? Asthma   ? Dianosed years ago-no meds at this time  ? Cancer [facility]) 2021  ? endometrial   ? Cataract   ? 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 Chronic kidney disease 1997  ? Nepthrotic syndrom with minimal change-in remission per pt - last seen by kidney MD- 2 mos ago ? name of MD   ? Dementia ([place])   ? Diabetes mellitus   ? type 2   ? Diabetes mellitus without complication ([place])   ? Dyslipidemia   ? Dyspnea   ? with exertion   ? Dysrhythmia   ? "skips beats"  ? Endometrial cancer ([place]) 2021  ? Family history of adverse reaction to anesthesia   ? son stopped breathing during surgery , mother slow to wake up   ? Fibromyalgia 1980's  ? Generalized anxiety disorder   ? GERD (gastroesophageal reflux disease)   ? occ- will use Tums and Prolosec  prn   ? Heart murmur   ? Hyperlipemia   ? Hypertension   ? not on blood pressure meds due to allergies - last dose of meds- months ago   ? Hypertension   ? Hypothyroidism   ? Hypothyroidism (acquired)   ? Irritable bowel syndrome 1980's  ? Major depressive disorder   ? Migraine headaches 1980's  ? On meds-well controlled  ? 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  ? Morbid obesity ([place])   ? Pernicious anemia   ? Pneumonia 2009  ? Several times over the past several years  ? PONV (postoperative nausea and vomiting)   ? was told by neurologist due to calcificaton in brain not to be put to sleep,  N/V during Op and Recovery   ? Recurrent upper respiratory infection (URI)   ? Sleep apnea   ? no cpap  ? Subarachnoid hemorrhage ([place]) 2010  ? Vaginal polyp   ? benign per pt  ? ?BP (!) 154/88   Pulse

## 2022-03-06 ENCOUNTER — Encounter: Payer: Self-pay | Admitting: Podiatry

## 2022-03-06 ENCOUNTER — Ambulatory Visit (INDEPENDENT_AMBULATORY_CARE_PROVIDER_SITE_OTHER): Payer: Medicare Other | Admitting: Podiatry

## 2022-03-06 DIAGNOSIS — E1165 Type 2 diabetes mellitus with hyperglycemia: Secondary | ICD-10-CM | POA: Diagnosis not present

## 2022-03-06 DIAGNOSIS — M79675 Pain in left toe(s): Secondary | ICD-10-CM

## 2022-03-06 DIAGNOSIS — M79674 Pain in right toe(s): Secondary | ICD-10-CM | POA: Diagnosis not present

## 2022-03-06 DIAGNOSIS — B351 Tinea unguium: Secondary | ICD-10-CM

## 2022-03-06 NOTE — Progress Notes (Signed)
This patient returns to my office for at risk foot care.  This patient requires this care by a professional since this patient will be at risk due to having diabetes and kidney disease. This patient is unable to cut nails herself since the patient cannot reach her nails.These nails are painful walking and wearing shoes.  This patient presents for at risk foot care today. ? ?General Appearance  Alert, conversant and in no acute stress. ? ?Vascular  Dorsalis pedis and posterior tibial  pulses are palpable  bilaterally.  Capillary return is within normal limits  bilaterally. Temperature is within normal limits  bilaterally. ? ?Neurologic  Senn-Weinstein monofilament wire test within normal limits  bilaterally. Muscle power within normal limits bilaterally. ? ?Nails Thick disfigured discolored nails with subungual debris  hallux nails bilaterally. No evidence of bacterial infection or drainage bilaterally. ? ?Orthopedic  No limitations of motion  feet .  No crepitus or effusions noted.  No bony pathology or digital deformities noted.  HAV  B/L.  Midfoot  DJD  B/L. ? ?Skin  normotropic skin with no porokeratosis noted bilaterally.  No signs of infections or ulcers noted.    ? ?Onychomycosis  Pain in right toes  Pain in left toes ? ?Consent was obtained for treatment procedures.   Mechanical debridement of nails 1-5  bilaterally performed with a nail nipper.  Filed with dremel without incident.  ? ? ?Return office visit     3 months                 Told patient to return for periodic foot care and evaluation due to potential at risk complications. ? ? ?Gardiner Barefoot DPM   ?

## 2022-03-12 ENCOUNTER — Ambulatory Visit (INDEPENDENT_AMBULATORY_CARE_PROVIDER_SITE_OTHER): Payer: Medicare Other | Admitting: Family Medicine

## 2022-03-12 ENCOUNTER — Encounter: Payer: Self-pay | Admitting: Family Medicine

## 2022-03-12 VITALS — BP 140/70 | HR 98 | Temp 98.8°F | Ht 62.0 in | Wt 185.2 lb

## 2022-03-12 DIAGNOSIS — E1165 Type 2 diabetes mellitus with hyperglycemia: Secondary | ICD-10-CM | POA: Diagnosis not present

## 2022-03-12 DIAGNOSIS — R42 Dizziness and giddiness: Secondary | ICD-10-CM

## 2022-03-12 DIAGNOSIS — S22060D Wedge compression fracture of T7-T8 vertebra, subsequent encounter for fracture with routine healing: Secondary | ICD-10-CM

## 2022-03-12 DIAGNOSIS — G250 Essential tremor: Secondary | ICD-10-CM

## 2022-03-12 NOTE — Progress Notes (Addendum)
? ?Subjective:  ? ? ? Patient ID: [name], female    DOB: 28-Nov-1951, 71 y.o.   MRN: [id number] ? ?Chief Complaint  ?Patient presents with  ? Transfer of Care  ? ? ?HPI ?TOC-"complicated patient" ?Fx spine in Feb-tripped over vacuum cord and fell onto stomache and hit head and got stitches. 01/30/22 and found fx vertebrae-went to [facility] and there for 15 days.  Surgeon said as "elderly", no surgery indicated.  To start PT-had home health. But pt wants to do at PT.  T7 fx compression and L1(chronic) ? ?Tremors-hasn't started meds yet.  ?Dizziness-some better ("50%")off elavil. Weaning wellbutrin as thinks makes dizzy as well.  Falls at times. ? ?DM type 2-not on any meds-mult allergies-not working on diet nor exercise ? ? ?Health Maintenance Due  ?Topic Date Due  ? OPHTHALMOLOGY EXAM  Never done  ? URINE MICROALBUMIN  Never done  ? Hepatitis C Screening  Never done  ? Pneumonia Vaccine 27+ Years old (3 - PPSV23 if available, else PCV20) 10/09/2019  ? Zoster Vaccines- Shingrix (2 of 2) 02/23/2021  ? ? ?Past Medical History:  ?Diagnosis Date  ? Allergy   ? Anemia   ? Arthritis   ? Asthma   ? Dianosed years ago-no meds at this time  ? Cancer [facility]) 2021  ? endometrial   ? Cataract   ? 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 Chronic kidney disease 1997  ? Nepthrotic syndrom with minimal change-in remission per pt - last seen by kidney MD- 2 mos ago ? name of MD   ? Dementia ([place])   ? Diabetes mellitus   ? type 2   ? Diabetes mellitus without complication ([place])   ? Dyslipidemia   ? Dyspnea   ? with exertion   ? Dysrhythmia   ? "skips beats"  ? Endometrial cancer ([place]) 2021  ? Family history of adverse reaction to anesthesia   ? son stopped breathing during surgery , mother slow to wake up   ? Fibromyalgia 1980's  ? Generalized anxiety disorder   ? GERD (gastroesophageal reflux disease)   ? occ- will use Tums and Prolosec  prn   ? Heart murmur   ? Hyperlipemia   ? Hypertension   ? not on blood pressure meds due  to allergies - last dose of meds- months ago   ? Hypertension   ? Hypothyroidism   ? Hypothyroidism (acquired)   ? Irritable bowel syndrome 1980's  ? Major depressive disorder   ? Migraine headaches 1980's  ? On meds-well controlled  ? 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  ? Morbid obesity ([place])   ? Pernicious anemia   ? Pneumonia 2009  ? Several times over the past several years  ? PONV (postoperative nausea and vomiting)   ? was told by neurologist due to calcificaton in brain not to be put to sleep, N/V during Op and Recovery   ? Recurrent upper respiratory infection (URI)   ? Sleep apnea   ? no cpap  ? Subarachnoid hemorrhage ([place]) 2010  ? Vaginal polyp   ? benign per pt  ? ? ?Past Surgical History:  ?Procedure Laterality Date  ? CATARACT EXTRACTION Right   ? CHOLECYSTECTOMY  1983  ? COLONOSCOPY  2008  ? DB  ? [place]  ? HYSTEROSCOPY WITH D & C  06/29/2011  ? Procedure: DILATATION AND CURETTAGE (D&C) /HYSTEROSCOPY;  Surgeon: Donnamae Jude, MD;  Location: [facility]  ORS;  Service: Gynecology;  Laterality: N/A;  ? ROBOTIC ASSISTED TOTAL HYSTERECTOMY WITH BILATERAL SALPINGO OOPHERECTOMY N/A 10/27/2020  ? Procedure: XI ROBOTIC ASSISTED TOTAL HYSTERECTOMY WITH BILATERAL SALPINGO OOPHORECTOMY;  Surgeon: Everitt Amber, MD;  Location: [facility] ORS;  Service: Gynecology;  Laterality: N/A;  ? SENTINEL NODE BIOPSY N/A 10/27/2020  ? Procedure: SENTINEL NODE BIOPSY;  Surgeon: Everitt Amber, MD;  Location: [facility] ORS;  Service: Gynecology;  Laterality: N/A;  ? ? ?Outpatient Medications Prior to Visit  ?Medication Sig Dispense Refill  ? acetaminophen (TYLENOL) 500 MG tablet Take 500 mg by mouth every 6 (six) hours as needed for moderate pain.    ? aspirin 325 MG EC tablet Take 1 tablet (325 mg total) by mouth daily. 30 tablet 0  ? bumetanide (BUMEX) 1 MG tablet Take 1 tablet (1 mg total) by mouth every morning. 90 tablet 1  ? buPROPion (WELLBUTRIN) 100 MG tablet Take 1 tablet (100 mg total) by mouth 2 (two) times  daily. (Patient taking differently: Take 100 mg by mouth daily.) 90 tablet 0  ? cholecalciferol (VITAMIN D3) 25 MCG (1000 UNIT) tablet Take 1,000 Units by mouth 2 (two) times a week.    ? cyanocobalamin (,VITAMIN B-12,) 1000 MCG/ML injection Inject 1,000 mcg into the muscle every 30 (thirty) days.     ? diphenoxylate-atropine (LOMOTIL) 2.5-0.025 MG tablet TAKE 1 TABLET BY MOUTH TWICE DAILY AS NEEDED FOR  DIARRHEA  OR  LOOSE  STOOLS 60 tablet 0  ? Ensure Max Protein (ENSURE MAX PROTEIN) LIQD Take 330 mLs (11 oz total) by mouth 2 (two) times daily.    ? estradiol (ESTRACE) 0.1 MG/GM vaginal cream Place 0.5 g vaginally 2 (two) times a week. Place 0.5g nightly for two weeks then twice a week after 30 g 11  ? Evolocumab (REPATHA SURECLICK) 416 MG/ML SOAJ INJECT 1 PEN SUBCUTANEOUSLY EVERY 14 DAYS 2 mL 11  ? gabapentin (NEURONTIN) 100 MG capsule Take 1 capsule (100 mg total) by mouth 3 (three) times daily. 90 capsule 0  ? levothyroxine (SYNTHROID) 50 MCG tablet Take 1 tablet (50 mcg total) by mouth daily. 90 tablet 1  ? MAGNESIUM PO Take by mouth.    ? nutrition supplement, JUVEN, (JUVEN) PACK Take 1 packet by mouth 2 (two) times daily between meals.  0  ? pantoprazole (PROTONIX) 40 MG tablet Take 1 tablet (40 mg total) by mouth daily. (Patient taking differently: Take 40 mg by mouth as needed.) 30 tablet 11  ? Potassium 99 MG TABS Take 198 mg by mouth daily.    ? primidone (MYSOLINE) 50 MG tablet Take 1 tablet (50 mg total) by mouth at bedtime. 90 tablet 1  ? calamine lotion Apply 1 application topically as needed for itching. (Patient not taking: Reported on 03/12/2022)  0  ? Multiple Vitamin (MULTIVITAMIN WITH MINERALS) TABS tablet Take 1 tablet by mouth daily. (Patient not taking: Reported on 03/12/2022) 30 tablet 0  ? polyethylene glycol (MIRALAX / GLYCOLAX) 17 g packet Take 17 g by mouth daily as needed for mild constipation. (Patient not taking: Reported on 03/12/2022) 14 each 0  ? senna (SENOKOT) 8.6 MG TABS tablet  Take 1 tablet (8.6 mg total) by mouth 2 (two) times daily. (Patient not taking: Reported on 03/12/2022) 60 tablet 0  ? ?No facility-administered medications prior to visit.  ? ? ?Allergies  ?Allergen Reactions  ? Atenolol   ?  Stomach problems, chills  ? Atorvastatin   ?  Other reaction(s): retain fld  ? Canagliflozin   ?  ALL DIABETIC MEDS PER PT  ? abdominal pain, headache  ? Losartan Potassium   ?  chills, HA, stomach pain  ? Acarbose Nausea Only  ?   headache,nausea  ? Cinnamon   ?  Stomach pain  ? Empagliflozin   ?   headaches  ? Glimepiride   ?  ALL DIABETIC MEDS PER PT  ?abdominal pain, headache  ? Metformin And Related Nausea And Vomiting and Other (See Comments)  ?  Patient states that she has severe chills, headache and cramping additionally. Says blood sugar is uncontrolled because of this  ? Other Other (See Comments)  ?  ALL DIABETIC MEDS PER PT   ? Pioglitazone Other (See Comments)  ?  ALL DIABETIC MEDS PER PT  ?Other reaction(s): peripheral edema  ? Pravastatin   ?  Other reaction(s): retain fld  ? Sitagliptin   ?  ALL DIABETIC MEDS PER PT  ?Other reaction(s): headache  ? Spironolactone-Hctz Other (See Comments)  ? Statins   ?  Myalgia  ? Sulfamethoxazole Other (See Comments)  ?  ALL DIABETIC MEDS PER PT   ? Tramadol Hcl Itching  ? Acarbose Other (See Comments)  ? Atenolol Other (See Comments)  ? Atorvastatin Other (See Comments)  ? Canagliflozin Other (See Comments)  ? Crestor [Rosuvastatin] Other (See Comments)  ? Empagliflozin Other (See Comments)  ? Glimepiride Other (See Comments)  ? Losartan Potassium Other (See Comments)  ? Metformin Other (See Comments)  ? Other Other (See Comments)  ? Pioglitazone Other (See Comments)  ? Pravastatin Other (See Comments)  ? Spironolactone-Hctz Other (See Comments)  ? Sulfa Antibiotics Rash and Other (See Comments)  ?  Mother has told patient in the past not to take, she cannot recall there reaction  ? ?ROS neg/noncontributory except as noted HPI/below ? ? ?    ?Objective:  ?  ? ?BP 140/70   Pulse 98   Temp 98.8 ?F (37.1 ?C) (Temporal)   Ht '5\' 2"'$  (1.575 m)   Wt 185 lb 4 oz (84 kg)   SpO2 96%   BMI 33.88 kg/m?  ?Wt Readings from Last 3 Encounters:  ?03/12/22 185

## 2022-03-12 NOTE — Patient Instructions (Signed)
It was very nice to see you today! ? ?Check on med refills.  Check insurance for mail order.  If not hear from [name] end of week about physical therapy, let us know. ? ? ? ? ?PLEASE NOTE: ? ?If you had any lab tests please let us know if you have not heard back within a few days. You may see your results on MyChart before we have a chance to review them but we will give you a call once they are reviewed by [name]. If we ordered any referrals today, please let us know if you have not heard from their office within the next week.  ? ?Please try these tips to maintain a healthy lifestyle: ? ?Eat most of your calories during the day when you are active. Eliminate processed foods including packaged sweets (pies, cakes, cookies), reduce intake of potatoes, white bread, white pasta, and white rice. Look for whole grain options, oat flour or almond flour. ? ?Each meal should contain half fruits/vegetables, one quarter protein, and one quarter carbs (no bigger than a computer mouse). ? ?Cut down on sweet beverages. This includes juice, soda, and sweet tea. Also watch fruit intake, though this is a healthier sweet option, it still contains natural sugar! Limit to 3 servings daily. ? ?Drink at least 1 glass of water with each meal and aim for at least 8 glasses per day ? ?Exercise at least 150 minutes every week.   ?

## 2022-03-13 ENCOUNTER — Telehealth: Payer: Self-pay | Admitting: *Deleted

## 2022-03-13 NOTE — Telephone Encounter (Signed)
After visit with PCP, patient stopped at front desk and stated that she needed a lift chair. Called patient to find out why she needed the lift chair and she stated that she needed it because she broke her back in two places and has been having trouble getting out of the regular chair. ?

## 2022-03-13 NOTE — Addendum Note (Signed)
Addended by: Wellington Hampshire on: 03/13/2022 09:17 AM ? ? Modules accepted: Orders ? ?

## 2022-03-28 NOTE — Telephone Encounter (Signed)
Patient called and stated someone told her lift chair order was ready for her to pick up - idont show one on file nor on the patient folders here upfront. Can you please check on this for me?  ?

## 2022-03-29 ENCOUNTER — Ambulatory Visit: Payer: Medicare Other

## 2022-03-29 ENCOUNTER — Ambulatory Visit: Payer: 59 | Admitting: Physician Assistant

## 2022-04-02 ENCOUNTER — Ambulatory Visit: Payer: Medicare Other | Attending: Family Medicine | Admitting: Physical Therapy

## 2022-04-02 ENCOUNTER — Encounter: Payer: Self-pay | Admitting: Physical Therapy

## 2022-04-02 DIAGNOSIS — S22060D Wedge compression fracture of T7-T8 vertebra, subsequent encounter for fracture with routine healing: Secondary | ICD-10-CM | POA: Diagnosis not present

## 2022-04-02 DIAGNOSIS — R262 Difficulty in walking, not elsewhere classified: Secondary | ICD-10-CM | POA: Diagnosis present

## 2022-04-02 DIAGNOSIS — M6281 Muscle weakness (generalized): Secondary | ICD-10-CM | POA: Diagnosis present

## 2022-04-02 DIAGNOSIS — S22068A Other fracture of T7-T8 thoracic vertebra, initial encounter for closed fracture: Secondary | ICD-10-CM | POA: Insufficient documentation

## 2022-04-02 DIAGNOSIS — R278 Other lack of coordination: Secondary | ICD-10-CM | POA: Diagnosis present

## 2022-04-02 DIAGNOSIS — R293 Abnormal posture: Secondary | ICD-10-CM | POA: Diagnosis present

## 2022-04-02 DIAGNOSIS — R2681 Unsteadiness on feet: Secondary | ICD-10-CM | POA: Diagnosis present

## 2022-04-02 DIAGNOSIS — R2689 Other abnormalities of gait and mobility: Secondary | ICD-10-CM | POA: Diagnosis present

## 2022-04-02 NOTE — Therapy (Signed)
[facility] ?[address] [place] [address] ?[place], [place], [address] ?Phone: [phone]   Fax:  [phone] ? ?Physical Therapy Evaluation ? ?Patient Details  ?Name: [name] ?MRN: [id number] ?Date of Birth: May 26, 1951 ?Referring Provider (PT): Cherlynn Kaiser ? ? ?Encounter Date: 04/02/2022 ? ? PT End of Session - 04/02/22 1428   ? ? Visit Number 1   ? Date for PT Re-Evaluation 06/11/22   ? PT Start Time 1223   ? PT Stop Time 1318   ? PT Time Calculation (min) 55 min   ? Activity Tolerance Patient tolerated treatment well   ? Behavior During Therapy [facility] for tasks assessed/performed   ? ?  ?  ? ?  ? ? ?Past Medical History:  ?Diagnosis Date  ? Allergy   ? Anemia   ? Arthritis   ? Asthma   ? Dianosed years ago-no meds at this time  ? Cancer [facility]) 2021  ? endometrial   ? Cataract   ? 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 Chronic kidney disease 1997  ? Nepthrotic syndrom with minimal change-in remission per pt - last seen by kidney MD- 2 mos ago ? name of MD   ? Dementia ([place])   ? Diabetes mellitus   ? type 2   ? Diabetes mellitus without complication ([place])   ? Dyslipidemia   ? Dyspnea   ? with exertion   ? Dysrhythmia   ? "skips beats"  ? Endometrial cancer ([place]) 2021  ? Family history of adverse reaction to anesthesia   ? son stopped breathing during surgery , mother slow to wake up   ? Fibromyalgia 1980's  ? Generalized anxiety disorder   ? GERD (gastroesophageal reflux disease)   ? occ- will use Tums and Prolosec  prn   ? Heart murmur   ? Hyperlipemia   ? Hypertension   ? not on blood pressure meds due to allergies - last dose of meds- months ago   ? Hypertension   ? Hypothyroidism   ? Hypothyroidism (acquired)   ? Irritable bowel syndrome 1980's  ? Major depressive disorder   ? Migraine headaches 1980's  ? On meds-well controlled  ? 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  ? Morbid obesity ([place])   ? Pernicious anemia   ? Pneumonia 2009  ? Several times over the past  several years  ? PONV (postoperative nausea and vomiting)   ? was told by neurologist due to calcificaton in brain not to be put to sleep, N/V during Op and Recovery   ? Recurrent upper respiratory infection (URI)   ? Sleep apnea   ? no cpap  ? Subarachnoid hemorrhage ([place]) 2010  ? Vaginal polyp   ? benign per pt  ? ? ?Past Surgical History:  ?Procedure Laterality Date  ? CATARACT EXTRACTION Right   ? CHOLECYSTECTOMY  1983  ? COLONOSCOPY  2008  ? DB  ? [place]  ? HYSTEROSCOPY WITH D & C  06/29/2011  ? Procedure: DILATATION AND CURETTAGE (D&C) /HYSTEROSCOPY;  Surgeon: Donnamae Jude, MD;  Location: [facility] ORS;  Service: Gynecology;  Laterality: N/A;  ? ROBOTIC ASSISTED TOTAL HYSTERECTOMY WITH BILATERAL SALPINGO OOPHERECTOMY N/A 10/27/2020  ? Procedure: XI ROBOTIC ASSISTED TOTAL HYSTERECTOMY WITH BILATERAL SALPINGO OOPHORECTOMY;  Surgeon: Everitt Amber, MD;  Location: [facility]S;  Service: Gynecology;  Laterality: N/A;  ? SENTINEL NODE BIOPSY N/A 10/27/2020  ? Procedure: SENTINEL NODE BIOPSY;  Surgeon: Everitt Amber,  MD;  Location: [facility];  Service: Gynecology;  Laterality: N/A;  ? ? ?There were no vitals filed for this visit. ? ? ? Subjective Assessment - 04/02/22 1221   ? ? Subjective Patient reports she fell on 01/30/22, tripped on a vacuum cord. She has a H/O  dizziness x several years. She was taking Amytriptylene for years for Migraines, but has recently gone off the medication. Her dizziness improved somewhat after going off the medication, but now her migraines have returned. MRI showed T7 compression fracture, also has mod spinal stenosis at C5-6, C4-5, and severe B foraminal stenosis at C5-6. She originally had a TLSO, but reports that it hurt her so much that the Dr said she could wear it as needed.   ? Pertinent History PMHx: DM, essential tremors, dizziness, dementia, cervical stenosis, endometrial cancer,SAH, fibromyalgia   ? Diagnostic tests MRI of T and C spine-1. Mild T7 compression  fracture with a small amount of bone marrow  edema, compatible with acute to subacute injury. Minimal height  loss.  2. Multilevel degenerative disc disease of the cervical spine with  moderate C5-6 and mild C4-5 spinal canal stenosis.  3. Severe bilateral neural foraminal stenosis at C5-6.   ? Patient Stated Goals Patient reports that she is still dizzy and wants to become more steady and decresae her fall risk.   ? Currently in Pain? Yes   ? Pain Score 6    ? Pain Location Back   ? Pain Orientation Mid   ? Pain Descriptors / Indicators Aching   ? Pain Type Acute pain   ? Pain Radiating Towards Mid/lower back   ? Pain Onset More than a month ago   ? Pain Frequency Intermittent   ? Aggravating Factors  Increased activity.   ? Pain Relieving Factors sitting,   ? Effect of Pain on Daily Activities poor sleep,   ? ?  ?  ? ?  ? ? ? ? ? [facility] PT Assessment - 04/02/22 0001   ? ?  ? Assessment  ? Medical Diagnosis T7 compress fracture, C5-6 B severe formainal stenosis and [place] stenosis at C4-5, C5-6   ? Referring Provider (PT) Cherlynn Kaiser   ? Prior Therapy Inpatient rehab   ?  ? Precautions  ? Required Braces or Orthoses Spinal Brace   Patient was given a TLSO, but reports that the Dr told her to wear it as needed due to it causing her so much pain.  ?  ? Balance Screen  ? Has the patient fallen in the past 6 months Yes   ? How many times? 15   ?  ? Home Environment  ? Living Environment Private residence   ? Living Arrangements Alone   ? Available Help at Discharge Family   ? Type of Home House   ? Home Access Level entry   ? Home Layout One level   ? [place] - 2 wheels;Tub bench;Grab bars - tub/shower;Hand held shower head   ? Additional Comments patient reports low light throughout her house. Uses counter to rise from commode.   ?  ? Prior Function  ? Level of Independence Independent   ? Leisure None stated. She has a schizo affective disorder daughter. Patient manages paperwork.   ?  ? Cognition  ? Overall  Cognitive Status Within Functional Limits for tasks assessed   Self distracting.  ?  ? Posture/Postural Control  ? Posture Comments L shoulder elevated, slightly forward head, round shoulders,  increased lumbar lordosis.   ?  ? ROM / Strength  ? AROM / PROM / Strength AROM;Strength   ?  ? AROM  ? Overall AROM Comments BUE ROM WFL in all joints. BLE WFL in all joints as well.   ?  ? Strength  ? Overall Strength Comments BUE WFL, although L shoulder, elbow demosntate weakness compared to R.   ? Strength Assessment Site Hip;Knee;Ankle   ? Right/Left Hip Right;Left   ? Right Hip Flexion 3/5   ? Right Hip Extension 3+/5   ? Right Hip External Rotation  3+/5   ? Right Hip Internal Rotation 3+/5   ? Right Hip ABduction 3+/5   ? Left Hip Flexion 3/5   ? Left Hip Extension 3+/5   ? Left Hip External Rotation 3+/5   ? Left Hip Internal Rotation 3+/5   ? Left Hip ABduction 3+/5   ? Right/Left Knee Right;Left   ? Right Knee Flexion 4-/5   ? Right Knee Extension 4-/5   ? Left Knee Flexion 4-/5   ? Left Knee Extension 4-/5   ? Right/Left Ankle Right;Left   ? Right Ankle Dorsiflexion 4-/5   ? Right Ankle Plantar Flexion 3+/5   ? Left Ankle Dorsiflexion 4-/5   ? Left Ankle Plantar Flexion 3+/5   ?  ? Flexibility  ? Soft Tissue Assessment /Muscle Length yes   ? Hamstrings B SLR to at least 80   ?  ? Palpation  ? Palpation comment Patient with no complaints of TTP, but very tight scalenes B, cervical paraspinals, Up traps B   ?  ? Ambulation/Gait  ? Gait Comments Patient walks with RW, MI, reports it has helped her balance, but she still feels unsteady and can't always use it in her home. slow pace, cautious.   ?  ? Balance  ? Balance Assessed Yes   ?  ? Standardized Balance Assessment  ? Standardized Balance Assessment Timed Up and Go Test;Five Times Sit to Stand   ? Five times sit to stand comments  14.56   Slightly elevated surface.  ?  ? Timed Up and Go Test  ? Normal TUG (seconds) 15.72   ? Cognitive TUG (seconds) 15.84   ? TUG  Comments No AD.   ? ?  ?  ? ?  ? ? ? ? ? ? ? ? ? ? ? ? ? ?Objective measurements completed on examination: See above findings.  ? ? ? ? ? ? ? ? ? ? ? ? ? ? PT Education - 04/02/22 1428   ? ? Education Details PO

## 2022-04-02 NOTE — Telephone Encounter (Signed)
Lift chair order was on desk in folder labeled patient pick-up. Called patient to let her know, no answer. Placed order in folder upfront. ?

## 2022-04-03 ENCOUNTER — Telehealth: Payer: Self-pay | Admitting: Family Medicine

## 2022-04-03 ENCOUNTER — Ambulatory Visit (INDEPENDENT_AMBULATORY_CARE_PROVIDER_SITE_OTHER): Payer: Medicare Other | Admitting: *Deleted

## 2022-04-03 DIAGNOSIS — E538 Deficiency of other specified B group vitamins: Secondary | ICD-10-CM | POA: Diagnosis not present

## 2022-04-03 MED ORDER — CYANOCOBALAMIN 1000 MCG/ML IJ SOLN
1000.0000 ug | Freq: Once | INTRAMUSCULAR | Status: AC
  Administered 2022-04-03: 1000 ug via INTRAMUSCULAR

## 2022-04-03 NOTE — Telephone Encounter (Signed)
Patient would like a referral to ENT - patient was last seen on 03/12/22.  ?

## 2022-04-03 NOTE — Progress Notes (Signed)
Pt here for her monthly B 12 injection,   given in left deltoid per patient preference by Hildred Alamin, RN. Patient tolerated injection well. next dose scheduled for 05/02/22.  ?

## 2022-04-03 NOTE — Telephone Encounter (Signed)
Please advise 

## 2022-04-04 ENCOUNTER — Other Ambulatory Visit: Payer: Self-pay | Admitting: *Deleted

## 2022-04-04 DIAGNOSIS — R42 Dizziness and giddiness: Secondary | ICD-10-CM

## 2022-04-04 NOTE — Telephone Encounter (Signed)
Patient stated that she has seen neurologist concerning dizziness. ENT referral placed.  ?

## 2022-04-04 NOTE — Telephone Encounter (Signed)
Pt is returning a call to our office and is asking for a call back. ?

## 2022-04-04 NOTE — Telephone Encounter (Signed)
Left message to return my call.  

## 2022-04-10 ENCOUNTER — Ambulatory Visit: Payer: Medicare Other | Admitting: Physical Therapy

## 2022-04-12 ENCOUNTER — Encounter: Payer: Self-pay | Admitting: Physical Therapy

## 2022-04-12 ENCOUNTER — Ambulatory Visit: Payer: Medicare Other | Admitting: Physical Therapy

## 2022-04-12 DIAGNOSIS — S22068A Other fracture of T7-T8 thoracic vertebra, initial encounter for closed fracture: Secondary | ICD-10-CM

## 2022-04-12 DIAGNOSIS — R293 Abnormal posture: Secondary | ICD-10-CM | POA: Diagnosis not present

## 2022-04-12 DIAGNOSIS — M6281 Muscle weakness (generalized): Secondary | ICD-10-CM

## 2022-04-12 DIAGNOSIS — R2681 Unsteadiness on feet: Secondary | ICD-10-CM

## 2022-04-12 DIAGNOSIS — R262 Difficulty in walking, not elsewhere classified: Secondary | ICD-10-CM

## 2022-04-12 DIAGNOSIS — R278 Other lack of coordination: Secondary | ICD-10-CM

## 2022-04-12 DIAGNOSIS — R2689 Other abnormalities of gait and mobility: Secondary | ICD-10-CM

## 2022-04-12 NOTE — Therapy (Signed)
[place] ?[employer] ?[address] ?[place], [place], [address] ?Phone: [phone]   Fax:  [phone] ? ?Physical Therapy Treatment ? ?Patient Details  ?Name: [name] ?MRN: [id number] ?Date of Birth: [birth year] ?Referring Provider (PT): Cherlynn Kaiser ? ? ?Encounter Date: 04/12/2022 ? ? PT End of Session - 04/12/22 1457   ? ? Visit Number 2   ? Date for PT Re-Evaluation 06/11/22   ? Authorization Type UHC Medicare and MCD   ? Progress Note Due on Visit 10   ? PT Start Time 7124   ? PT Stop Time 5809   ? PT Time Calculation (min) 40 min   ? Activity Tolerance Patient tolerated treatment well   ? Behavior During Therapy [facility] for tasks assessed/performed;Anxious   ? ?  ?  ? ?  ? ? ?Past Medical History:  ?Diagnosis Date  ? Allergy   ? Anemia   ? Arthritis   ? Asthma   ? Dianosed years ago-no meds at this time  ? Cancer [facility]) 2021  ? endometrial   ? Cataract   ? 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 Chronic kidney disease 1997  ? Nepthrotic syndrom with minimal change-in remission per pt - last seen by kidney MD- 2 mos ago ? name of MD   ? Dementia ([place])   ? Diabetes mellitus   ? type 2   ? Diabetes mellitus without complication ([place])   ? Dyslipidemia   ? Dyspnea   ? with exertion   ? Dysrhythmia   ? "skips beats"  ? Endometrial cancer ([place]) 2021  ? Family history of adverse reaction to anesthesia   ? son stopped breathing during surgery , mother slow to wake up   ? Fibromyalgia 1980's  ? Generalized anxiety disorder   ? GERD (gastroesophageal reflux disease)   ? occ- will use Tums and Prolosec  prn   ? Heart murmur   ? Hyperlipemia   ? Hypertension   ? not on blood pressure meds due to allergies - last dose of meds- months ago   ? Hypertension   ? Hypothyroidism   ? Hypothyroidism (acquired)   ? Irritable bowel syndrome 1980's  ? Major depressive disorder   ? Migraine headaches 1980's  ? On meds-well controlled  ? 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  ? Morbid  obesity ([place])   ? Pernicious anemia   ? Pneumonia 2009  ? Several times over the past several years  ? PONV (postoperative nausea and vomiting)   ? was told by neurologist due to calcificaton in brain not to be put to sleep, N/V during Op and Recovery   ? Recurrent upper respiratory infection (URI)   ? Sleep apnea   ? no cpap  ? Subarachnoid hemorrhage ([place]) 2010  ? Vaginal polyp   ? benign per pt  ? ? ?Past Surgical History:  ?Procedure Laterality Date  ? CATARACT EXTRACTION Right   ? CHOLECYSTECTOMY  1983  ? COLONOSCOPY  2008  ? DB  ? [place]  ? HYSTEROSCOPY WITH D & C  06/29/2011  ? Procedure: DILATATION AND CURETTAGE (D&C) /HYSTEROSCOPY;  Surgeon: Donnamae Jude, MD;  Location: [facility];  Service: Gynecology;  Laterality: N/A;  ? ROBOTIC ASSISTED TOTAL HYSTERECTOMY WITH BILATERAL SALPINGO OOPHERECTOMY N/A 10/27/2020  ? Procedure: XI ROBOTIC ASSISTED TOTAL HYSTERECTOMY WITH BILATERAL SALPINGO OOPHORECTOMY;  Surgeon: Everitt Amber, MD;  Location: [facility];  Service: Gynecology;  Laterality:  N/A;  ? SENTINEL NODE BIOPSY N/A 10/27/2020  ? Procedure: SENTINEL NODE BIOPSY;  Surgeon: Everitt Amber, MD;  Location: [facility];  Service: Gynecology;  Laterality: N/A;  ? ? ?There were no vitals filed for this visit. ? ? Subjective Assessment - 04/12/22 1405   ? ? Subjective I took myself off of my amitriptyline because I read about it and I decided it was part of my dizziness. I think it was 50% of my dizziness, I'm still dizzy but its better. But my migraines are bad and my sleep is almost gone without this medicine. Most sleep aides make me dizzy. I fall as much as 3 times a day when I fall, the walker is a recent thing they gave it to me in the hospital. I don't use it all the time but my kids say I should.   ? Pertinent History PMHx: DM, essential tremors, dizziness, dementia, cervical stenosis, endometrial cancer,SAH, fibromyalgia   ? Diagnostic tests MRI of T and C spine-1. Mild T7 compression  fracture with a small amount of bone marrow  edema, compatible with acute to subacute injury. Minimal height  loss.  2. Multilevel degenerative disc disease of the cervical spine with  moderate C5-6 and mild C4-5 spinal canal stenosis.  3. Severe bilateral neural foraminal stenosis at C5-6.   ? Patient Stated Goals Patient reports that she is still dizzy and wants to become more steady and decresae her fall risk.   ? Currently in Pain? Yes   ? Pain Score 4    ? Pain Location Other (Comment)   generalized  ? Pain Orientation Other (Comment)   generalized  ? Pain Descriptors / Indicators Other (Comment)   varies, "depends on where it is"  ? Pain Type Chronic pain   ? ?  ?  ? ?  ? ? ? ? ? [facility] PT Assessment - 04/12/22 0001   ? ?  ? Observation/Other Assessments  ? 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 ?  ?  ? ?  ? ? ? ? ? ? ? ? ? ? ? ? ? ? ? ? [place] [facility] Adult PT Treatment/Exercise - 04/12/22 0001   ? ?  ? Neuro Re-ed   ? Neuro Re-ed Details  Epley manuever for R ear   ? ?  ?  ? ?  ? ? ? ? ? ? ? ? ? ? PT Education - 04/12/22 1455   ? ? Education Details being cautious on reading/applying too much information from [employer]/other sources without consulted a medical professional, also education on PT interventions for dizziness/VOR/BPPV, also encouraged her to use RW regularly until we can improve her strength and balance. VOR HEP   ? Person(s) Educated Patient   ? Methods Explanation;Demonstration;Handout   ? Comprehension Verbalized understanding;Need further instruction   ? ?  ?  ? ?  ? ? ? PT Short Term Goals - 04/02/22 1442   ? ?  ? PT SHORT TERM GOAL #1  ? Title independent with initial HEP   ? Time 4   ? Period Weeks   ? Status New   ? Target Date 04/30/22   ?  ? PT SHORT TERM GOAL #2  ? Title Assess Vestibular system and identify any deficits to be  addressed.   ? Baseline TBD   ? Time 2   ?  Period Weeks   ? Status New   ? Target Date 04/16/22   ?  ? PT SHORT TERM GOAL #3  ? Title Patient will return demosntration of correct posture while per forming various household tasks such as vacumming, washing dishes to decrease strain on back   ? Time 4   ? Period Weeks   ? Status New   ? Target Date 04/30/22   ? ?  ?  ? ?  ? ? ? ? PT Long Term Goals - 04/02/22 1442   ? ?  ? PT LONG TERM GOAL #1  ? Title independent with advanced HEP   ? Time 10   ? Period Weeks   ? Status New   ? Target Date 06/11/22   ?  ? PT LONG TERM GOAL #2  ? Title Complete cognitive TUG in < 12 seconds to demosntrate decreased fall risk.   ? Baseline 15.8   ? Time 10   ? Period Weeks   ? Status New   ? Target Date 06/11/22   ?  ? PT LONG TERM GOAL #3  ? Title complete 5 x STS from regular height surfae in < 12 sec to demosntrate improved LE strength and balance.   ? Baseline 14.56   ? Time 10   ? Period Weeks   ? Status New   ? Target Date 06/11/22   ?  ? PT LONG TERM GOAL #4  ? Title Score at least 21 on FGA to demosntrate lower fall risk.   ? Baseline TBD   ? Time 10   ? Period Weeks   ? Status New   ? Target Date 06/11/22   ?  ? PT LONG TERM GOAL #5  ? Title Patient will walk at least 300' on level and unlevel surfaces iwth LRAD, MI.   ? Baseline 80', RW, S, level surfaces.   ? Time 10   ? Period Weeks   ? Status New   ? Target Date 06/11/22   ? ?  ?  ? ?  ? ? ? ? ? ? ? ? Plan - 04/12/22 1500   ? ? Clinical Impression Statement [name] arrives today, still really dizzy and very talkative. She took herself off of Amitriptyline and has been having severe migraines and has been missing sleep for the past 48 hours due to this but she tells me her dizziness is 50% better after stopping this medication. 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 I gave her quite a bit of education about being cautious on reading/applying too much information from [employer]/other sources without consulted a medical profess

## 2022-04-17 ENCOUNTER — Ambulatory Visit: Payer: Medicare Other | Attending: Family Medicine | Admitting: Physical Therapy

## 2022-04-17 ENCOUNTER — Encounter: Payer: Self-pay | Admitting: Physical Therapy

## 2022-04-17 DIAGNOSIS — R293 Abnormal posture: Secondary | ICD-10-CM | POA: Insufficient documentation

## 2022-04-17 DIAGNOSIS — M6281 Muscle weakness (generalized): Secondary | ICD-10-CM | POA: Insufficient documentation

## 2022-04-17 DIAGNOSIS — R278 Other lack of coordination: Secondary | ICD-10-CM | POA: Insufficient documentation

## 2022-04-17 DIAGNOSIS — R262 Difficulty in walking, not elsewhere classified: Secondary | ICD-10-CM | POA: Insufficient documentation

## 2022-04-17 DIAGNOSIS — R2681 Unsteadiness on feet: Secondary | ICD-10-CM | POA: Insufficient documentation

## 2022-04-17 DIAGNOSIS — R2689 Other abnormalities of gait and mobility: Secondary | ICD-10-CM | POA: Insufficient documentation

## 2022-04-17 NOTE — Therapy (Signed)
[place] ?[facility] ?[address] ?[place], [place], [address] ?Phone: [phone]   Fax:  [phone] ? ?Physical Therapy Treatment ? ?Patient Details  ?Name: [name] ?MRN: [id number] ?Date of Birth: [birth year] ?Referring Provider (PT): Cherlynn Kaiser ? ? ?Encounter Date: 04/17/2022 ? ? PT End of Session - 04/17/22 1620   ? ? Visit Number 3   ? Date for PT Re-Evaluation 06/11/22   ? Authorization Type UHC Medicare and MCD   ? Progress Note Due on Visit 10   ? PT Start Time 1534   ? PT Stop Time 1612   ? PT Time Calculation (min) 38 min   ? Activity Tolerance Patient tolerated treatment well   ? Behavior During Therapy [facility] for tasks assessed/performed;Anxious   ? ?  ?  ? ?  ? ? ?Past Medical History:  ?Diagnosis Date  ? Allergy   ? Anemia   ? Arthritis   ? Asthma   ? Dianosed years ago-no meds at this time  ? Cancer [facility]) 2021  ? endometrial   ? Cataract   ? 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 Chronic kidney disease 1997  ? Nepthrotic syndrom with minimal change-in remission per pt - last seen by kidney MD- 2 mos ago ? name of MD   ? Dementia ([place])   ? Diabetes mellitus   ? type 2   ? Diabetes mellitus without complication ([place])   ? Dyslipidemia   ? Dyspnea   ? with exertion   ? Dysrhythmia   ? "skips beats"  ? Endometrial cancer ([place]) 2021  ? Family history of adverse reaction to anesthesia   ? son stopped breathing during surgery , mother slow to wake up   ? Fibromyalgia 1980's  ? Generalized anxiety disorder   ? GERD (gastroesophageal reflux disease)   ? occ- will use Tums and Prolosec  prn   ? Heart murmur   ? Hyperlipemia   ? Hypertension   ? not on blood pressure meds due to allergies - last dose of meds- months ago   ? Hypertension   ? Hypothyroidism   ? Hypothyroidism (acquired)   ? Irritable bowel syndrome 1980's  ? Major depressive disorder   ? Migraine headaches 1980's  ? On meds-well controlled  ? 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  ? Morbid  obesity ([place])   ? Pernicious anemia   ? Pneumonia 2009  ? Several times over the past several years  ? PONV (postoperative nausea and vomiting)   ? was told by neurologist due to calcificaton in brain not to be put to sleep, N/V during Op and Recovery   ? Recurrent upper respiratory infection (URI)   ? Sleep apnea   ? no cpap  ? Subarachnoid hemorrhage ([place]) 2010  ? Vaginal polyp   ? benign per pt  ? ? ?Past Surgical History:  ?Procedure Laterality Date  ? CATARACT EXTRACTION Right   ? CHOLECYSTECTOMY  1983  ? COLONOSCOPY  2008  ? DB  ? [place]  ? HYSTEROSCOPY WITH D & C  06/29/2011  ? Procedure: DILATATION AND CURETTAGE (D&C) /HYSTEROSCOPY;  Surgeon: Donnamae Jude, MD;  Location: [facility];  Service: Gynecology;  Laterality: N/A;  ? ROBOTIC ASSISTED TOTAL HYSTERECTOMY WITH BILATERAL SALPINGO OOPHERECTOMY N/A 10/27/2020  ? Procedure: XI ROBOTIC ASSISTED TOTAL HYSTERECTOMY WITH BILATERAL SALPINGO OOPHORECTOMY;  Surgeon: Everitt Amber, MD;  Location: [facility];  Service: Gynecology;  Laterality:  N/A;  ? SENTINEL NODE BIOPSY N/A 10/27/2020  ? Procedure: SENTINEL NODE BIOPSY;  Surgeon: Everitt Amber, MD;  Location: [facility];  Service: Gynecology;  Laterality: N/A;  ? ? ?There were no vitals filed for this visit. ? ? Subjective Assessment - 04/17/22 1538   ? ? Subjective Still not sleeping well, I got some from the store just in case but I've been too scared to take it because of the dizziness. Only got to do my exercises once because my son could only come by yesterday. I didn't notice any difference in my dizziness after the manuever we did last time.   ? Pertinent History PMHx: DM, essential tremors, dizziness, dementia, cervical stenosis, endometrial cancer,SAH, fibromyalgia   ? Patient Stated Goals Patient reports that she is still dizzy and wants to become more steady and decresae her fall risk.   ? Currently in Pain? Yes   ? Pain Score 4    ? Pain Location Head   ? Pain Orientation Anterior   ?  Pain Descriptors / Indicators Aching;Sore   ? Pain Type Chronic pain   ? ?  ?  ? ?  ? ? ? ? ? ? ? ? ? ? ? ? ? ? ? ? ? ? ? ? [place] Adult PT Treatment/Exercise - 04/17/22 0001   ? ?  ? Exercises  ? Exercises Other Exercises;Knee/Hip   ? Other Exercises  VOR exercises horizontal and vertical at metronomoe 65BPM; horizontal and vertical VORs with moving targets; horizontal and vertical saccades;   ?  ? Knee/Hip Exercises: Aerobic  ? Recumbent Bike L3 x6 minutes   ?  ? Knee/Hip Exercises: Seated  ? Other Seated Knee/Hip Exercises sit to stands with horizontal VORs x10   ? ?  ?  ? ?  ? ? ? ? ? ? ? ? ? ? PT Education - 04/17/22 1620   ? ? Education Details exercise form, use of metronome and form for exercises at home   ? Person(s) Educated Patient   ? Methods Explanation   ? Comprehension Verbalized understanding   ? ?  ?  ? ?  ? ? ? PT Short Term Goals - 04/02/22 1442   ? ?  ? PT SHORT TERM GOAL #1  ? Title independent with initial HEP   ? Time 4   ? Period Weeks   ? Status New   ? Target Date 04/30/22   ?  ? PT SHORT TERM GOAL #2  ? Title Assess Vestibular system and identify any deficits to be addressed.   ? Baseline TBD   ? Time 2   ? Period Weeks   ? Status New   ? Target Date 04/16/22   ?  ? PT SHORT TERM GOAL #3  ? Title Patient will return demosntration of correct posture while per forming various household tasks such as vacumming, washing dishes to decrease strain on back   ? Time 4   ? Period Weeks   ? Status New   ? Target Date 04/30/22   ? ?  ?  ? ?  ? ? ? ? PT Long Term Goals - 04/02/22 1442   ? ?  ? PT LONG TERM GOAL #1  ? Title independent with advanced HEP   ? Time 10   ? Period Weeks   ? Status New   ? Target Date 06/11/22   ?  ? PT LONG TERM GOAL #2  ? Title Complete cognitive TUG in <  12 seconds to demosntrate decreased fall risk.   ? Baseline 15.8   ? Time 10   ? Period Weeks   ? Status New   ? Target Date 06/11/22   ?  ? PT LONG TERM GOAL #3  ? Title complete 5 x STS from regular height surfae in < 12  sec to demosntrate improved LE strength and balance.   ? Baseline 14.56   ? Time 10   ? Period Weeks   ? Status New   ? Target Date 06/11/22   ?  ? PT LONG TERM GOAL #4  ? Title Score at least 21 on FGA to demosntrate lower fall risk.   ? Baseline TBD   ? Time 10   ? Period Weeks   ? Status New   ? Target Date 06/11/22   ?  ? PT LONG TERM GOAL #5  ? Title Patient will walk at least 300' on level and unlevel surfaces iwth LRAD, MI.   ? Baseline 80', RW, S, level surfaces.   ? Time 10   ? Period Weeks   ? Status New   ? Target Date 06/11/22   ? ?  ?  ? ?  ? ? ? ? ? ? ? ? Plan - 04/17/22 1621   ? ? Clinical Impression Statement Ms. Danish arrives today continuing to complain of dizziness and that she is not sleeping/having migraines due to not taking  her medication. Focused on vestibular rehab today with VOR based exercises. Really needed a lot of repetitive cues, I?m not sure she is performing HEP correctly at home; also tells me she is not consistently using her RW at home, I continued to encourage use of AD to prevent falls.  Fairly talkative today which did limited session. Extremely poor at multitasking, needed a lot of multimodal cues. Will continue efforts.   ? Personal Factors and Comorbidities Age;Fitness;Comorbidity 3+   ? Comorbidities Fahr's syndroime, dizziness, migraines.   ? Examination-Activity Limitations Lift;Stand;Locomotion Level;Bend;Reach Overhead;Transfers   ? Examination-Participation Restrictions [place];Interpersonal Relationship   ? Stability/Clinical Decision Making Evolving/Moderate complexity   ? Clinical Decision Making Moderate   ? Rehab Potential Good   ? PT Frequency 2x / week   ? PT Duration Other (comment)   ? PT Treatment/Interventions ADLs/Self Care Home Management;Iontophoresis '4mg'$ /ml Dexamethasone;Balance training;Vestibular;Gait training;Neuromuscular re-education;Passive range of motion;Dry needling;Therapeutic exercise;Therapeutic activities;Functional mobility  training;Moist Heat;Canalith Repostioning;Cryotherapy;Manual techniques;Patient/family education   ? PT Next Visit Plan check orthostatics   ? PT Home Exercise Plan [email or web address]   ? Consulted and Agree with Plan

## 2022-04-19 ENCOUNTER — Encounter: Payer: Self-pay | Admitting: Physical Therapy

## 2022-04-19 ENCOUNTER — Ambulatory Visit: Payer: Medicare Other | Admitting: Physical Therapy

## 2022-04-19 DIAGNOSIS — M6281 Muscle weakness (generalized): Secondary | ICD-10-CM

## 2022-04-19 DIAGNOSIS — R293 Abnormal posture: Secondary | ICD-10-CM | POA: Diagnosis not present

## 2022-04-19 DIAGNOSIS — R2681 Unsteadiness on feet: Secondary | ICD-10-CM

## 2022-04-19 DIAGNOSIS — R262 Difficulty in walking, not elsewhere classified: Secondary | ICD-10-CM

## 2022-04-19 DIAGNOSIS — R2689 Other abnormalities of gait and mobility: Secondary | ICD-10-CM

## 2022-04-19 DIAGNOSIS — R278 Other lack of coordination: Secondary | ICD-10-CM

## 2022-04-19 NOTE — Therapy (Signed)
[facility] [address] ?[place], [place], [address] ?Phone: [phone]   Fax:  [phone] ? ?Physical Therapy Treatment ? ?Patient Details  ?Name: [name] ?MRN: [id number] ?Date of Birth: 1951/12/11 ?Referring Provider (PT): Cherlynn Kaiser ? ? ?Encounter Date: 04/19/2022 ? ? PT End of Session - 04/19/22 1606   ? ? Visit Number 4   ? Authorization Type UHC Medicare and MCD   ? Progress Note Due on Visit 10   ? PT Start Time 5035   ? PT Stop Time 1530   ? PT Time Calculation (min) 45 min   ? Activity Tolerance Patient tolerated treatment well   ? Behavior During Therapy [facility] for tasks assessed/performed;Anxious   ? ?  ?  ? ?  ? ? ?Past Medical History:  ?Diagnosis Date  ? Allergy   ? Anemia   ? Arthritis   ? Asthma   ? Dianosed years ago-no meds at this time  ? Cancer [place]) 2021  ? endometrial   ? Cataract   ? 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 Chronic kidney disease 1997  ? Nepthrotic syndrom with minimal change-in remission per pt - last seen by kidney MD- 2 mos ago ? name of MD   ? Dementia ([place])   ? Diabetes mellitus   ? type 2   ? Diabetes mellitus without complication ([place])   ? Dyslipidemia   ? Dyspnea   ? with exertion   ? Dysrhythmia   ? "skips beats"  ? Endometrial cancer ([place]) 2021  ? Family history of adverse reaction to anesthesia   ? son stopped breathing during surgery , mother slow to wake up   ? Fibromyalgia 1980's  ? Generalized anxiety disorder   ? GERD (gastroesophageal reflux disease)   ? occ- will use Tums and Prolosec  prn   ? Heart murmur   ? Hyperlipemia   ? Hypertension   ? not on blood pressure meds due to allergies - last dose of meds- months ago   ? Hypertension   ? Hypothyroidism   ? Hypothyroidism (acquired)   ? Irritable bowel syndrome 1980's  ? Major depressive disorder   ? Migraine headaches 1980's  ? On meds-well controlled  ? 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  ? Morbid obesity ([place])   ? Pernicious anemia   ?  Pneumonia 2009  ? Several times over the past several years  ? PONV (postoperative nausea and vomiting)   ? was told by neurologist due to calcificaton in brain not to be put to sleep, N/V during Op and Recovery   ? Recurrent upper respiratory infection (URI)   ? Sleep apnea   ? no cpap  ? Subarachnoid hemorrhage ([place]) 2010  ? Vaginal polyp   ? benign per pt  ? ? ?Past Surgical History:  ?Procedure Laterality Date  ? CATARACT EXTRACTION Right   ? CHOLECYSTECTOMY  1983  ? COLONOSCOPY  2008  ? DB  ? [place]  ? HYSTEROSCOPY WITH D & C  06/29/2011  ? Procedure: DILATATION AND CURETTAGE (D&C) /HYSTEROSCOPY;  Surgeon: Donnamae Jude, MD;  Location: [facility];  Service: Gynecology;  Laterality: N/A;  ? ROBOTIC ASSISTED TOTAL HYSTERECTOMY WITH BILATERAL SALPINGO OOPHERECTOMY N/A 10/27/2020  ? Procedure: XI ROBOTIC ASSISTED TOTAL HYSTERECTOMY WITH BILATERAL SALPINGO OOPHORECTOMY;  Surgeon: Everitt Amber, MD;  Location: [facility];  Service: Gynecology;  Laterality: N/A;  ? SENTINEL NODE BIOPSY N/A 10/27/2020  ?  Procedure: SENTINEL NODE BIOPSY;  Surgeon: Everitt Amber, MD;  Location: [facility];  Service: Gynecology;  Laterality: N/A;  ? ? ?There were no vitals filed for this visit. ? ? Subjective Assessment - 04/19/22 1449   ? ? Subjective I'm having migranes, I'm not doing too well.   ? Pertinent History PMHx: DM, essential tremors, dizziness, dementia, cervical stenosis, endometrial cancer,SAH, fibromyalgia   ? Diagnostic tests MRI of T and C spine-1. Mild T7 compression fracture with a small amount of bone marrow  edema, compatible with acute to subacute injury. Minimal height  loss.  2. Multilevel degenerative disc disease of the cervical spine with  moderate C5-6 and mild C4-5 spinal canal stenosis.  3. Severe bilateral neural foraminal stenosis at C5-6.   ? Patient Stated Goals Patient reports that she is still dizzy and wants to become more steady and decresae her fall risk.   ? Currently in Pain? Yes    ? Pain Score 5    ? Pain Location Head   ? Pain Orientation Anterior   ? Pain Descriptors / Indicators Aching   ? ?  ?  ? ?  ? ? ? ? ? ? ? ? ? ? ? ? ? ? ? ? ? ? ? ? [facility] Adult PT Treatment/Exercise - 04/19/22 0001   ? ?  ? Standardized Balance Assessment  ? Standardized Balance Assessment Timed Up and Go Test   ?  ? Timed Up and Go Test  ? TUG Normal TUG   ? Normal TUG (seconds) 9   no AD  ?  ? Knee/Hip Exercises: Aerobic  ? Recumbent Bike Lvl 5 x 6 mins   ?  ? Knee/Hip Exercises: Standing  ? Lateral Step Up Both;1 set;10 reps   ? Lateral Step Up Limitations 6'   ? Forward Step Up Both;2 sets;10 reps   ? Forward Step Up Limitations 6'   ? Other Standing Knee Exercises rows 2x10 w/ red TB; shoulder ext w/ red tb 2x10; shoulder flexion 10 reps w/ 2# cane 10 reps w/ 3# cane   ? Other Standing Knee Exercises heel raises 2x10; toe raises 2x10   ?  ? Knee/Hip Exercises: Seated  ? Other Seated Knee/Hip Exercises ER w/ red TB 2x10   ? ?  ?  ? ?  ? ? ? ? ? ? ? ? ? ? ? ? PT Short Term Goals - 04/19/22 1603   ? ?  ? PT SHORT TERM GOAL #1  ? Title independent with initial HEP   ? Time 4   ? Period Weeks   ? Status On-going   ? Target Date 04/30/22   ?  ? PT SHORT TERM GOAL #2  ? Title Assess Vestibular system and identify any deficits to be addressed.   ? Baseline TBD   ? Time 2   ? Period Weeks   ? Status On-going   ? Target Date 04/16/22   ?  ? PT SHORT TERM GOAL #3  ? Title Patient will return demosntration of correct posture while per forming various household tasks such as vacumming, washing dishes to decrease strain on back   ? Time 4   ? Period Weeks   ? Status On-going   ? Target Date 04/30/22   ? ?  ?  ? ?  ? ? ? ? PT Long Term Goals - 04/19/22 1558   ? ?  ? PT LONG TERM GOAL #1  ? Title independent with  advanced HEP   ? Time 10   ? Period Weeks   ? Status New   ? Target Date 06/11/22   ?  ? PT LONG TERM GOAL #2  ? Title Complete cognitive TUG in < 12 seconds to demosntrate decreased fall risk.   ? Baseline 9 secs   ?  Time 10   ? Period Weeks   ? Status Achieved   ? Target Date 06/11/22   ?  ? PT LONG TERM GOAL #3  ? Title complete 5 x STS from regular height surfae in < 12 sec to demosntrate improved LE strength and balance.   ? Baseline 10.53   ? Time 10   ? Period Weeks   ? Status Achieved   ? Target Date 06/11/22   ?  ? PT LONG TERM GOAL #4  ? Title Score at least 21 on FGA to demosntrate lower fall risk.   ? Baseline TBD   ? Time 10   ? Period Weeks   ? Status On-going   ? Target Date 06/11/22   ?  ? PT LONG TERM GOAL #5  ? Title Patient will walk at least 300' on level and unlevel surfaces iwth LRAD, MI.   ? Baseline 80', RW, S, level surfaces.   ? Time 10   ? Period Weeks   ? Status On-going   ? Target Date 06/11/22   ? ?  ?  ? ?  ? ? ? ? ? ? ? ? Plan - 04/19/22 1610   ? ? Clinical Impression Statement Patient arrived not feeling well. She stated she was having migranes and they never seem to go away. We focused on strengthening and balance for today's session. She was compliant and did well overall. She did complain of dizziness during steps but after a break she recovered and did well afterwards. VC's and tactile cues needed for all exercises   ? Personal Factors and Comorbidities Age;Fitness;Comorbidity 3+   ? Comorbidities Fahr's syndroime, dizziness, migraines.   ? Examination-Activity Limitations Lift;Stand;Locomotion Level;Bend;Reach Overhead;Transfers   ? Examination-Participation Restrictions [place];Interpersonal Relationship   ? Stability/Clinical Decision Making Evolving/Moderate complexity   ? Clinical Decision Making Moderate   ? Rehab Potential Good   ? PT Frequency 2x / week   ? PT Duration Other (comment)   ? PT Treatment/Interventions ADLs/Self Care Home Management;Iontophoresis '4mg'$ /ml Dexamethasone;Balance training;Vestibular;Gait training;Neuromuscular re-education;Passive range of motion;Dry needling;Therapeutic exercise;Therapeutic activities;Functional mobility training;Moist Heat;Canalith  Repostioning;Cryotherapy;Manual techniques;Patient/family education   ? PT Next Visit Plan progress as tolerated   ? PT Home Exercise Plan [id number]   ? Consulted and Agree with Plan of Care Patient   ?

## 2022-04-23 ENCOUNTER — Other Ambulatory Visit: Payer: Self-pay | Admitting: Gastroenterology

## 2022-04-23 NOTE — Telephone Encounter (Signed)
Dr Silverio Decamp can this patient have a refill of Lomotil  ?

## 2022-04-24 ENCOUNTER — Ambulatory Visit: Payer: Medicare Other | Admitting: Physical Therapy

## 2022-04-24 ENCOUNTER — Telehealth: Payer: Self-pay | Admitting: Gastroenterology

## 2022-04-24 ENCOUNTER — Other Ambulatory Visit: Payer: Self-pay | Admitting: Gastroenterology

## 2022-04-24 MED ORDER — PANTOPRAZOLE SODIUM 40 MG PO TBEC: 40.0000 mg | DELAYED_RELEASE_TABLET | Freq: Every day | ORAL | 2 refills | Status: DC

## 2022-04-24 NOTE — Telephone Encounter (Signed)
Called patient and told her I sent in Pantoprazole for her.  She needs an office appointment also. She stated she is still having severe diarrhea but wanted to wait and call back tomorrow to see if she gets better first , She will need to speak to a Triage Nurse ?

## 2022-04-24 NOTE — Telephone Encounter (Signed)
Patient called requesting protonix medication refill. Per patient, has severe diarrhea. Please give patient a call once it has been sent to pharmacy. Please advise. ?

## 2022-04-25 NOTE — Telephone Encounter (Signed)
Patient called again this morning stating she is still having severe diarrhea and that she had asked for Lomotil to be called in for her. Did not see any reference to Lomotil.  Patient would like someone to call her once it's been called in so she can go pick it up.  If there is an issue, please call her as well.  Thank you. ?

## 2022-04-25 NOTE — Telephone Encounter (Signed)
Spoke with the patient. She tells me that this is her "usual diarrhea that I deal with sometimes and have for years." She is happy to come see you but "really need to Lomotil in order to do that." Denies any abdominal pain or fevers.  ?Please advise. ?thanks ?

## 2022-04-26 ENCOUNTER — Ambulatory Visit: Payer: Medicare Other | Admitting: Physical Therapy

## 2022-04-26 NOTE — Telephone Encounter (Signed)
Patient advised. She will try her PCP. She will call back and ask for me to schedule a follow up appointment . ?

## 2022-04-26 NOTE — Telephone Encounter (Signed)
She has not been seen in the office for over a year, given its a controlled medication.  We cannot call in prescription without seeing the patient.  Maybe she can reach out to her PMD.  Please schedule follow-up office visit with app soon ?

## 2022-04-26 NOTE — Telephone Encounter (Signed)
Patient called back to check on the status of her Lomotil.  She did not want to go through the weekend without her diarrhea medication.  Please call patient and advise.  Thank you. ?

## 2022-05-01 ENCOUNTER — Ambulatory Visit: Payer: Medicare Other | Admitting: Physical Therapy

## 2022-05-02 ENCOUNTER — Ambulatory Visit: Payer: Medicare Other

## 2022-05-03 ENCOUNTER — Ambulatory Visit: Payer: Medicare Other | Admitting: Physical Therapy

## 2022-05-04 ENCOUNTER — Ambulatory Visit (INDEPENDENT_AMBULATORY_CARE_PROVIDER_SITE_OTHER): Payer: Medicare Other | Admitting: Family Medicine

## 2022-05-04 ENCOUNTER — Encounter: Payer: Self-pay | Admitting: Family Medicine

## 2022-05-04 VITALS — BP 150/88 | HR 94 | Temp 97.5°F | Ht 62.0 in | Wt 186.0 lb

## 2022-05-04 DIAGNOSIS — F5101 Primary insomnia: Secondary | ICD-10-CM | POA: Diagnosis not present

## 2022-05-04 DIAGNOSIS — E538 Deficiency of other specified B group vitamins: Secondary | ICD-10-CM | POA: Diagnosis not present

## 2022-05-04 DIAGNOSIS — R600 Localized edema: Secondary | ICD-10-CM | POA: Diagnosis not present

## 2022-05-04 MED ORDER — CYANOCOBALAMIN 1000 MCG/ML IJ SOLN
1000.0000 ug | Freq: Once | INTRAMUSCULAR | Status: AC
  Administered 2022-05-04: 1000 ug via INTRAMUSCULAR

## 2022-05-04 MED ORDER — FUROSEMIDE 20 MG PO TABS: 20.0000 mg | ORAL_TABLET | Freq: Every day | ORAL | 1 refills | Status: DC

## 2022-05-04 MED ORDER — FUROSEMIDE 20 MG PO TABS: 20.0000 mg | ORAL_TABLET | Freq: Every day | ORAL | 3 refills | Status: DC

## 2022-05-04 NOTE — Progress Notes (Signed)
Subjective:     Patient ID: [name], female    DOB: 29-Jan-1951, 71 y.o.   MRN: [id number]  Chief Complaint  Patient presents with   Follow-up    Follow-up to discuss recent blood work Wants B 12 injection      HPI Hasn't started primidone.  Occ takes gabapentin.   B12-on injections-monthly.  Due today Had labs in jan-wants to discuss.renal function 48 and pt concerned. Has seen neph-minimal change dz and nephrotic syndrome.  [facility]-last seen 1.5 yrs ago. Will see Pa soon. Wonders if back fx from CKD stage 3.   Itching, muscle cramps as well.  Some edema if not take bumetanide for fluid-now gets allergy-chills,ha,stomach pain, rash.  Some sob-new-at rest or walking-but not every time. Urine has changed to foamy.  Some nausea.  Some wheezing and sl cough. Not her normal.  Has been taking some NSAID's and knows not supposed to. Drinking plenty of water Doesn't sleep well.  Insomnia-has seen psych and meds not work or make more dizzy. Can go 1.5 days w/o sleeping(off elavil).  Migraines worse as well.  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 name of MD    Dementia ([place])    Diabetes mellitus    type 2    Diabetes mellitus without complication ([place])    Dyslipidemia    Dyspnea    with exertion    Dysrhythmia    "skips beats"   Endometrial cancer ([place]) 2021   Family history of  adverse reaction to anesthesia    son stopped breathing during surgery , mother slow to wake up    Fibromyalgia 1980's   Generalized anxiety disorder    GERD (gastroesophageal reflux disease)    occ- will use Tums and Prolosec  prn    Heart murmur    Hyperlipemia    Hypertension    not on blood pressure meds due to allergies - last dose of meds- months ago    Hypertension    Hypothyroidism    Hypothyroidism (acquired)    Irritable bowel syndrome 1980's   Major depressive disorder    Migraine headaches 1980's   On meds-well controlled   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   Morbid obesity ([place])    Pernicious anemia    Pneumonia 2009   Several times over the past several years   PONV (postoperative nausea and vomiting)    was told by neurologist due to calcificaton in brain not to be put to sleep, N/V during Op and Recovery    Recurrent upper respiratory infection (URI)    Sleep apnea    no cpap   Subarachnoid hemorrhage ([place]) 2010   Vaginal polyp    benign per pt    Past Surgical  History:  Procedure Laterality Date   CATARACT EXTRACTION Right    CHOLECYSTECTOMY  1983   COLONOSCOPY  2008   DB   DILATION AND CURETTAGE OF UTERUS  1972   HYSTEROSCOPY WITH D & C  06/29/2011   Procedure: DILATATION AND CURETTAGE (D&C) /HYSTEROSCOPY;  Surgeon: Donnamae Jude, MD;  Location: [facility];  Service: Gynecology;  Laterality: N/A;   ROBOTIC ASSISTED TOTAL HYSTERECTOMY WITH BILATERAL SALPINGO OOPHERECTOMY N/A 10/27/2020   Procedure: XI ROBOTIC ASSISTED TOTAL HYSTERECTOMY WITH BILATERAL SALPINGO OOPHORECTOMY;  Surgeon: Everitt Amber, MD;  Location: [facility];  Service: Gynecology;  Laterality: N/A;   SENTINEL NODE BIOPSY N/A 10/27/2020   Procedure: SENTINEL NODE BIOPSY;  Surgeon: Everitt Amber, MD;  Location: [facility];  Service: Gynecology;  Laterality: N/A;    Outpatient Medications Prior to Visit  Medication Sig Dispense Refill   acetaminophen (TYLENOL) 500 MG tablet Take 500 mg by mouth  every 6 (six) hours as needed for moderate pain.     aspirin 325 MG EC tablet Take 1 tablet (325 mg total) by mouth daily. 30 tablet 0   calamine lotion Apply 1 application topically as needed for itching.  0   cholecalciferol (VITAMIN D3) 25 MCG (1000 UNIT) tablet Take 1,000 Units by mouth 2 (two) times a week.     Cholecalciferol 25 MCG (1000 UT) capsule      cyanocobalamin (,VITAMIN B-12,) 1000 MCG/ML injection Inject 1,000 mcg into the muscle every 30 (thirty) days.      diphenoxylate-atropine (LOMOTIL) 2.5-0.025 MG tablet TAKE 1 TABLET BY MOUTH TWICE DAILY AS NEEDED FOR DIARRHEA OR  LOOSE  STOOLS 60 tablet 0   Ensure Max Protein (ENSURE MAX PROTEIN) LIQD Take 330 mLs (11 oz total) by mouth 2 (two) times daily.     estradiol (ESTRACE) 0.1 MG/GM vaginal cream Place 0.5 g vaginally 2 (two) times a week. Place 0.5g nightly for two weeks then twice a week after 30 g 11   Evolocumab (REPATHA SURECLICK) 831 MG/ML SOAJ INJECT 1 PEN SUBCUTANEOUSLY EVERY 14 DAYS 2 mL 11   gabapentin (NEURONTIN) 100 MG capsule Take 1 capsule (100 mg total) by mouth 3 (three) times daily. 90 capsule 0   MAGNESIUM PO Take by mouth.     Multiple Vitamin (MULTIVITAMIN WITH MINERALS) TABS tablet Take 1 tablet by mouth daily. 30 tablet 0   nutrition supplement, JUVEN, (JUVEN) PACK Take 1 packet by mouth 2 (two) times daily between meals.  0   pantoprazole (PROTONIX) 40 MG tablet Take 1 tablet (40 mg total) by mouth daily. 30 tablet 2   polyethylene glycol (MIRALAX / GLYCOLAX) 17 g packet Take 17 g by mouth daily as needed for mild constipation. 14 each 0   Potassium 99 MG TABS Take 198 mg by mouth daily.     primidone (MYSOLINE) 50 MG tablet Take 1 tablet (50 mg total) by mouth at bedtime. 90 tablet 1   senna (SENOKOT) 8.6 MG TABS tablet Take 1 tablet (8.6 mg total) by mouth 2 (two) times daily. 60 tablet 0   levothyroxine (SYNTHROID) 50 MCG tablet Take 1 tablet (50 mcg total) by mouth daily. 90 tablet 1   buPROPion  (WELLBUTRIN) 100 MG tablet Take 1 tablet (100 mg total) by mouth 2 (two) times daily. (Patient taking differently: Take 100 mg by mouth daily.) 90 tablet 0   No facility-administered medications prior to visit.    Allergies  Allergen Reactions   Atenolol     Stomach problems, chills  Atorvastatin     Other reaction(s): retain fld   Canagliflozin     ALL DIABETIC MEDS PER PT   abdominal pain, headache   Losartan Potassium     chills, HA, stomach pain   Acarbose Nausea Only     headache,nausea   Cinnamon     Stomach pain   Empagliflozin      headaches   Glimepiride     ALL DIABETIC MEDS PER PT  abdominal pain, headache   Metformin And Related Nausea And Vomiting and Other (See Comments)    Patient states that she has severe chills, headache and cramping additionally. Says blood sugar is uncontrolled because of this   Other Other (See Comments)    ALL DIABETIC MEDS PER PT    Pioglitazone Other (See Comments)    ALL DIABETIC MEDS PER PT  Other reaction(s): peripheral edema   Pravastatin     Other reaction(s): retain fld   Sitagliptin     ALL DIABETIC MEDS PER PT  Other reaction(s): headache   Spironolactone-Hctz Other (See Comments)   Statins     Myalgia   Sulfamethoxazole Other (See Comments)    ALL DIABETIC MEDS PER PT    Tramadol Hcl Itching   Acarbose Other (See Comments)   Atenolol Other (See Comments)   Atorvastatin Other (See Comments)   Atorvastatin Calcium     Other reaction(s): retain fld   Canagliflozin Other (See Comments)   Crestor [Rosuvastatin] Other (See Comments)   Empagliflozin Other (See Comments)   Glimepiride Other (See Comments)   Losartan Potassium Other (See Comments)   Metformin Other (See Comments)   Other Other (See Comments)   Pioglitazone Other (See Comments)   Pravastatin Other (See Comments)   Rosuvastatin Calcium    Spironolactone-Hctz Other (See Comments)   Tramadol    Bumex [Bumetanide] Rash    Chills, rash, ha, stomach,    Sulfa Antibiotics Rash and Other (See Comments)    Mother has told patient in the past not to take, she cannot recall there reaction   ROS neg/noncontributory except as noted HPI/below Having a lot of problems.  Still w/intermitt diarrhea-seeing GI.  Chronic pain-different areas.      Objective:     BP (!) 150/88   Pulse 94   Temp (!) 97.5 F (36.4 C) (Temporal)   Ht '5\' 2"'$  (1.575 m)   Wt 186 lb (84.4 kg)   SpO2 99%   BMI 34.02 kg/m  Wt Readings from Last 3 Encounters:  05/04/22 186 lb (84.4 kg)  03/12/22 185 lb 4 oz (84 kg)  03/05/22 177 lb 3.2 oz (80.4 kg)    Physical Exam   Gen: [facility] NAD [facility] HEENT: NCAT, conjunctiva not injected, sclera nonicteric NECK:  supple, no thyromegaly, no nodes, no carotid bruits CARDIAC: RRR, S1S2+, 2/6 murmur. DP 1+B LUNGS: CTAB. No wheezes ABDOMEN:  BS+, soft, NTND, No HSM, no masses EXT:  1+ BLE edema MSK: ussing rollator  NEURO: A&O x3.  CN II-XII intact.  PSYCH: normal mood. Good eye contact     Assessment & Plan:   Problem List Items Addressed This Visit       Other   Primary insomnia   B12 deficiency - Primary   Local edema   B12 deficiency-chronic/stable.  Continue monthly injections. Due and given today Localized edema-chronic, but worse..  MULTIPLE drug allergies/sensitivities.  Bumex-getting SI and now rash.  Will try furosemide'20mg'$ -keep appt w/nept-h/o minimal change disease.  May be flaring.  Declines  labs-will do at neph. Advised her Fx may or may not be related to CKD(doubt).  Chronic dizziness and falls.   Insomnia-chronic.  SE to muld meds. has a lot going on.  A lot of drug intol/sensitivities.  Consider trazodone or remeron, but given changing bumex to lasix, don't want to add  lot of meds at once.  Hard to sort out chronic symptoms from drug related specifically.  Will have pt f/u monthly as VERY complex and too much going on at once.  Meds ordered this encounter  Medications   cyanocobalamin ((VITAMIN B-12))  injection 1,000 mcg   DISCONTD: furosemide (LASIX) 20 MG tablet    Sig: Take 1 tablet (20 mg total) by mouth daily.    Dispense:  30 tablet    Refill:  3   furosemide (LASIX) 20 MG tablet    Sig: Take 1 tablet (20 mg total) by mouth daily.    Dispense:  30 tablet    Refill:  1    Wellington Hampshire, MD

## 2022-05-04 NOTE — Patient Instructions (Addendum)
It was very nice to see you today!  Start furosemide daily.  Let me know Monday how doing-fluid pill Glad you see neph next week-defer labs to them.  Try gabapentin at night for sleep   PLEASE NOTE:  If you had any lab tests please let us know if you have not heard back within a few days. You may see your results on MyChart before we have a chance to review them but we will give you a call once they are reviewed by [name]. If we ordered any referrals today, please let us know if you have not heard from their office within the next week.   Please try these tips to maintain a healthy lifestyle:  Eat most of your calories during the day when you are active. Eliminate processed foods including packaged sweets (pies, cakes, cookies), reduce intake of potatoes, white bread, white pasta, and white rice. Look for whole grain options, oat flour or almond flour.  Each meal should contain half fruits/vegetables, one quarter protein, and one quarter carbs (no bigger than a computer mouse).  Cut down on sweet beverages. This includes juice, soda, and sweet tea. Also watch fruit intake, though this is a healthier sweet option, it still contains natural sugar! Limit to 3 servings daily.  Drink at least 1 glass of water with each meal and aim for at least 8 glasses per day  Exercise at least 150 minutes every week.

## 2022-05-06 DIAGNOSIS — F5101 Primary insomnia: Secondary | ICD-10-CM | POA: Insufficient documentation

## 2022-05-06 DIAGNOSIS — E538 Deficiency of other specified B group vitamins: Secondary | ICD-10-CM | POA: Insufficient documentation

## 2022-05-06 DIAGNOSIS — R600 Localized edema: Secondary | ICD-10-CM | POA: Insufficient documentation

## 2022-05-07 ENCOUNTER — Telehealth: Payer: Self-pay | Admitting: Gastroenterology

## 2022-05-07 NOTE — Telephone Encounter (Signed)
We received a call from patient regarding scheduling an OV with [name] for "severe diarrhea." [name] has nothing available. I offered to schedule with APP, pt declined. Per patient, would like to speak with nurse. Please advise.

## 2022-05-07 NOTE — Telephone Encounter (Signed)
Spoke with the patient. She is needing to schedule the appointment to refill the Lomotil. She has spells of diarrhea. It is "unpredictable and not every day." She has used Lomotil off and on this past year.  Agrees to see an APP to discuss her refills.

## 2022-05-21 ENCOUNTER — Telehealth: Payer: Self-pay | Admitting: Family Medicine

## 2022-05-21 NOTE — Telephone Encounter (Signed)
Would like to know if we have seen records yet from [place]?

## 2022-05-21 NOTE — Telephone Encounter (Signed)
Patient notified that we have received her records from [facility].

## 2022-05-28 ENCOUNTER — Telehealth: Payer: Self-pay | Admitting: Family Medicine

## 2022-05-28 NOTE — Progress Notes (Unsigned)
05/30/2022 [name] [id number] Nov 29, 1951  Referring provider: [name], MD Primary GI doctor: [name]  ASSESSMENT AND PLAN:   Irritable bowel syndrome with diarrhea with AB cramping Some acute worsening, will rule out infectious process Get KUB to evaluate for possible stool burden with decreased movement.  Sent in lomitil If everything is negative, will consider colonoscopy to evaluate for microscopic colitis -     TSH; Future -     High sensitivity CRP; Future -     Clostridium difficile Toxin B, Qualitative, Real-Time PCR; Future -     GI Profile, Stool, PCR; Future -     DG Abd 1 View; Future -     diphenoxylate-atropine (LOMOTIL) 2.5-0.025 MG tablet; TAKE 1 TABLET BY MOUTH TWICE DAILY AS NEEDED FOR DIARRHEA OR  LOOSE  STOOLS  Fatty liver --Continue to work on risk factor modification including diet exercise and control of risk factors including blood sugars. - monitor q 6 months.   Iron deficiency anemia, unspecified iron deficiency anemia type -     IBC + Ferritin; Future -     CBC with Differential/Platelet; Future Check for iron def anemia, worsening anemia Likely from CKD however if true iron def may want to consider colonoscopy sooner.    History of Present Illness:  71 y.o. female  with a past medical history of type 2 diabetes, GERD, anxiety, hypertension, hyperlipidemia hypothyroidism, obesity history of subarachnoid hemorrhage 2010, endometrial cancer 2021, anemia, IBS mixed constipation and diarrhea and others listed below, returns to clinic today for evaluation of IBS.  Status post hysterectomy, BSO and lymph node sampling 2021 stage I endometrial cancer. 05/2018 colonoscopy 2 small sessile polyps removed 1 tubular adenoma, diverticulosis internal hemorrhoid, recall 5 years. (05/2023) 02/08/2022 hemoglobin 10.5, white blood cell count 4.0, MCV 89.6, platelets 268 BUN 29, total protein 5.6, albumin 2.7, glucose 164, creatinine 0.99, GFR  improved to over 60. Has follow up with [place] [facility] specialist.   Last saw [name] 03/06/2021 for IBS-D.  Constipation improved after bowel purge, negative GI pathogen, O&P, fecal elastase, consider colon if continues.  Called requesting for refill of lomitil.   She states she has been extremely ill for several years, broke her back in 2 places 01/2022, now using cane/walker, has had falls She states she has has had 2 days since Feb where stools were loose but slightly formed.  She states she was having severe diarrhea, unable to go to PT due diarrhea.  She is having up to 8-10 stools a day, worse a week ago.  Can be with or without food, has had chills. She states she has allergies to medications.  OTC medications is not helping, ran out of her lomitil.  New medication with gabapentin, no other new meds. Was taken off amitriptyline and her migraines are back. She has dizziness She has drooling especially at night, has cough.  Has had ABX in last 6 months. States she has had a lot of stress, depression, and may get back on antidepressant.   Current Medications:   Current Outpatient Medications (Endocrine & Metabolic):    levothyroxine (SYNTHROID) 50 MCG tablet, Take 1 tablet (50 mcg total) by mouth daily.  Current Outpatient Medications (Cardiovascular):    Evolocumab (REPATHA SURECLICK) 127 MG/ML SOAJ, INJECT 1 PEN SUBCUTANEOUSLY EVERY 14 DAYS   furosemide (LASIX) 20 MG tablet, Take 1 tablet (20 mg total) by mouth daily. (Patient taking differently: Take 40 mg by mouth daily.)   Current  Outpatient Medications (Analgesics):    acetaminophen (TYLENOL) 500 MG tablet, Take 500 mg by mouth every 6 (six) hours as needed for moderate pain.   aspirin 325 MG EC tablet, Take 1 tablet (325 mg total) by mouth daily.  Current Outpatient Medications (Hematological):    cyanocobalamin (,VITAMIN B-12,) 1000 MCG/ML injection, Inject 1,000 mcg into the muscle every 30 (thirty) days.    Current Outpatient Medications (Other):    calamine lotion, Apply 1 application topically as needed for itching.   cholecalciferol (VITAMIN D3) 25 MCG (1000 UNIT) tablet, Take 1,000 Units by mouth 2 (two) times a week.   Cholecalciferol 25 MCG (1000 UT) capsule,    Ensure Max Protein (ENSURE MAX PROTEIN) LIQD, Take 330 mLs (11 oz total) by mouth 2 (two) times daily.   estradiol (ESTRACE) 0.1 MG/GM vaginal cream, Place 0.5 g vaginally 2 (two) times a week. Place 0.5g nightly for two weeks then twice a week after   gabapentin (NEURONTIN) 100 MG capsule, Take 1 capsule (100 mg total) by mouth 3 (three) times daily.   Multiple Vitamin (MULTIVITAMIN WITH MINERALS) TABS tablet, Take 1 tablet by mouth daily.   nutrition supplement, JUVEN, (JUVEN) PACK, Take 1 packet by mouth 2 (two) times daily between meals.   pantoprazole (PROTONIX) 40 MG tablet, Take 1 tablet (40 mg total) by mouth daily.   Potassium 99 MG TABS, Take 198 mg by mouth daily.   primidone (MYSOLINE) 50 MG tablet, Take 1 tablet (50 mg total) by mouth at bedtime.   diphenoxylate-atropine (LOMOTIL) 2.5-0.025 MG tablet, TAKE 1 TABLET BY MOUTH TWICE DAILY AS NEEDED FOR DIARRHEA OR  LOOSE  STOOLS   MAGNESIUM PO, Take by mouth. (Patient not taking: Reported on 05/30/2022)   polyethylene glycol (MIRALAX / GLYCOLAX) 17 g packet, Take 17 g by mouth daily as needed for mild constipation. (Patient not taking: Reported on 05/30/2022)   senna (SENOKOT) 8.6 MG TABS tablet, Take 1 tablet (8.6 mg total) by mouth 2 (two) times daily. (Patient not taking: Reported on 05/30/2022)  Surgical History:  She  has a past surgical history that includes Dilation and curettage of uterus (1972); Cholecystectomy (1983); Hysteroscopy with D & C (06/29/2011); Colonoscopy (2008); Robotic assisted total hysterectomy with bilateral salpingo oophorectomy (N/A, 10/27/2020); Sentinel node biopsy (N/A, 10/27/2020); and Cataract extraction (Right). Family History:  Her family  history includes Anesthesia problems in her granddaughter, mother, and son; Arthritis in an other family member; Bladder Cancer in her father; Cerebrovascular Disease in her mother; Colon cancer in her father; Diabetes in an other family member; High blood pressure in an other family member; Liver cancer in her father; Schizophrenia in her child and daughter; Tremor in her child and son. Social History:   reports that she has never smoked. She has never used smokeless tobacco. She reports that she does not drink alcohol and does not use drugs.  Current Medications, Allergies, Past Medical History, Past Surgical History, Family History and Social History were reviewed in [employer] record.  Physical Exam: BP (!) 148/80   Pulse 95   Ht '5\' 2"'$  (1.575 m)   Wt 183 lb (83 kg)   BMI 33.47 kg/m  General:   Pleasant, well developed female in no acute distress Heart : Regular rate and rhythm; systolic murmur Pulm: Clear anteriorly; no wheezing Abdomen:  Soft, Obese AB, Active bowel sounds. mild tenderness in the entire abdomen. Without guarding and Without rebound, No organomegaly appreciated. Rectal: Not evaluated Extremities:  without  edema. Neurologic:  Alert and  oriented x4;  No focal deficits. Walking with cane.  Psych:  Cooperative. Normal mood and affect.   Vladimir Crofts, PA-C 05/30/22

## 2022-05-28 NOTE — Telephone Encounter (Signed)
Please see note below. 

## 2022-05-28 NOTE — Telephone Encounter (Signed)
Pharmacy stated they need Rx's sent over for every medication that patient is taking.

## 2022-05-28 NOTE — Telephone Encounter (Signed)
Pharmacy is calling to get the process started to have pt's medications "blister packed" due to confusion/memory issues.  Information about this added to 06/04/22 appointment note.   Feel free to call pharmacy for more information as needed.

## 2022-05-30 ENCOUNTER — Other Ambulatory Visit (INDEPENDENT_AMBULATORY_CARE_PROVIDER_SITE_OTHER): Payer: Medicare Other

## 2022-05-30 ENCOUNTER — Encounter: Payer: Self-pay | Admitting: Physician Assistant

## 2022-05-30 ENCOUNTER — Telehealth: Payer: Self-pay | Admitting: Physician Assistant

## 2022-05-30 ENCOUNTER — Ambulatory Visit (INDEPENDENT_AMBULATORY_CARE_PROVIDER_SITE_OTHER): Payer: Medicare Other | Admitting: Physician Assistant

## 2022-05-30 ENCOUNTER — Ambulatory Visit (INDEPENDENT_AMBULATORY_CARE_PROVIDER_SITE_OTHER)
Admission: RE | Admit: 2022-05-30 | Discharge: 2022-05-30 | Disposition: A | Discharge status: Home / Self Care | Payer: Medicare Other | Source: Ambulatory Visit | Attending: Physician Assistant | Admitting: Physician Assistant

## 2022-05-30 VITALS — BP 148/80 | HR 95 | Ht 62.0 in | Wt 183.0 lb

## 2022-05-30 DIAGNOSIS — K580 Irritable bowel syndrome with diarrhea: Secondary | ICD-10-CM

## 2022-05-30 DIAGNOSIS — D509 Iron deficiency anemia, unspecified: Secondary | ICD-10-CM | POA: Diagnosis not present

## 2022-05-30 DIAGNOSIS — K760 Fatty (change of) liver, not elsewhere classified: Secondary | ICD-10-CM | POA: Diagnosis not present

## 2022-05-30 DIAGNOSIS — K529 Noninfective gastroenteritis and colitis, unspecified: Secondary | ICD-10-CM

## 2022-05-30 DIAGNOSIS — R109 Unspecified abdominal pain: Secondary | ICD-10-CM

## 2022-05-30 LAB — HIGH SENSITIVITY CRP: CRP, High Sensitivity: 0.79 mg/L (ref 0.000–5.000)

## 2022-05-30 LAB — CBC WITH DIFFERENTIAL/PLATELET
Basophils Absolute: 0 10*3/uL (ref 0.0–0.1)
Basophils Relative: 0.5 % (ref 0.0–3.0)
Eosinophils Absolute: 0.1 10*3/uL (ref 0.0–0.7)
Eosinophils Relative: 1.9 % (ref 0.0–5.0)
HCT: 35.6 % — ABNORMAL LOW (ref 36.0–46.0)
Hemoglobin: 12 g/dL (ref 12.0–15.0)
Lymphocytes Relative: 28.3 % (ref 12.0–46.0)
Lymphs Abs: 1.2 10*3/uL (ref 0.7–4.0)
MCHC: 33.7 g/dL (ref 30.0–36.0)
MCV: 87.2 fl (ref 78.0–100.0)
Monocytes Absolute: 0.4 10*3/uL (ref 0.1–1.0)
Monocytes Relative: 9.9 % (ref 3.0–12.0)
Neutro Abs: 2.6 10*3/uL (ref 1.4–7.7)
Neutrophils Relative %: 59.4 % (ref 43.0–77.0)
Platelets: 208 10*3/uL (ref 150.0–400.0)
RBC: 4.08 Mil/uL (ref 3.87–5.11)
RDW: 15.2 % (ref 11.5–15.5)
WBC: 4.4 10*3/uL (ref 4.0–10.5)

## 2022-05-30 LAB — IBC + FERRITIN
Ferritin: 45.7 ng/mL (ref 10.0–291.0)
Iron: 41 ug/dL — ABNORMAL LOW (ref 42–145)
Saturation Ratios: 10.4 % — ABNORMAL LOW (ref 20.0–50.0)
TIBC: 393.4 ug/dL (ref 250.0–450.0)
Transferrin: 281 mg/dL (ref 212.0–360.0)

## 2022-05-30 LAB — TSH: TSH: 0.5 u[IU]/mL (ref 0.35–5.50)

## 2022-05-30 MED ORDER — DIPHENOXYLATE-ATROPINE 2.5-0.025 MG PO TABS: ORAL_TABLET | ORAL | 2 refills | Status: DC

## 2022-05-30 NOTE — Telephone Encounter (Signed)
Left message to return call 

## 2022-05-30 NOTE — Telephone Encounter (Signed)
Called and spoke with X-ray. I advised per Vicie Mutters, that the patient need to follow up with her PCP to discuss her symptoms.

## 2022-05-30 NOTE — Patient Instructions (Signed)
If you are age 73 or older, your body mass index should be between 23-30. Your Body mass index is 33.47 kg/m. If this is out of the aforementioned range listed, please consider follow up with your Primary Care Provider. ________________________________________________________  The [facility] providers would like to encourage you to use [facility] to communicate with providers for non-urgent requests or questions.  Due to long hold times on the telephone, sending your provider a message by [facility] may be a faster and more efficient way to get a response.  Please allow 48 business hours for a response.  Please remember that this is for non-urgent requests.  _______________________________________________________  Your provider has requested that you go to the basement level for lab work before leaving today. Press "B" on the elevator. The lab is located at the first door on the left as you exit the elevator.  Your provider has requested that you have an abdominal x ray before leaving today. Please go to the basement floor to our Radiology department for the test.  Follow up pending  Thank you for entrusting me with your care and choosing [facility].  Vicie Mutters, PA-C

## 2022-05-31 ENCOUNTER — Telehealth: Payer: Self-pay | Admitting: Family Medicine

## 2022-05-31 NOTE — Telephone Encounter (Signed)
Patient called back in regards to the voicemail left. I was able to inform patient to stay by her phone for a callback from Dr. Kathrin Ruddy CMA with her list of current medications. She can be reached at [phone].

## 2022-05-31 NOTE — Telephone Encounter (Signed)
Left message to return my call.  

## 2022-05-31 NOTE — Telephone Encounter (Signed)
Left message to return call 

## 2022-05-31 NOTE — Telephone Encounter (Signed)
Patient called stating she would like a referral/recommendation to an eye doctor who is taking [employer] and has an available appointment soon. States her eye sight has noticeably gotten worse over time and she wanted to have this addressed. She used to have an eye doctor she saw when needed but is unable to find/remember who that was due to her phone data being erased. She has tried getting in with another eye doctor but they aren't available until December. Please Advise.

## 2022-05-31 NOTE — Telephone Encounter (Signed)
Tried calling patient 4 times with no answer, left vm to return call. Patient has ov on Monday, 06/04/2022 will go over medications at that time. PCP aware.

## 2022-06-01 ENCOUNTER — Telehealth: Payer: Self-pay | Admitting: Physician Assistant

## 2022-06-01 ENCOUNTER — Other Ambulatory Visit: Payer: Self-pay | Admitting: *Deleted

## 2022-06-01 NOTE — Telephone Encounter (Signed)
Patient called in and would like to know if she can be prescribed a medication for nausea. She has started taking the miralax & benefiber BID as recommended yesterday, and after she drinks it she feels sick to her stomach and throws up. She has had a bowel movement today that was loose. She would also like me to mention to [place] that she has stage 3 kidney disease & she read online that benefiber should not be taken in patient's with this disease. Will route to PA for further recommendations.

## 2022-06-04 ENCOUNTER — Encounter: Payer: Self-pay | Admitting: Family Medicine

## 2022-06-04 ENCOUNTER — Other Ambulatory Visit: Payer: Self-pay

## 2022-06-04 ENCOUNTER — Ambulatory Visit (INDEPENDENT_AMBULATORY_CARE_PROVIDER_SITE_OTHER): Payer: Medicare Other | Admitting: Family Medicine

## 2022-06-04 VITALS — BP 138/60 | HR 75 | Temp 98.3°F | Ht 62.0 in | Wt 184.5 lb

## 2022-06-04 DIAGNOSIS — H7292 Unspecified perforation of tympanic membrane, left ear: Secondary | ICD-10-CM | POA: Insufficient documentation

## 2022-06-04 DIAGNOSIS — R42 Dizziness and giddiness: Secondary | ICD-10-CM | POA: Insufficient documentation

## 2022-06-04 DIAGNOSIS — N289 Disorder of kidney and ureter, unspecified: Secondary | ICD-10-CM

## 2022-06-04 DIAGNOSIS — H9193 Unspecified hearing loss, bilateral: Secondary | ICD-10-CM | POA: Insufficient documentation

## 2022-06-04 DIAGNOSIS — I25709 Atherosclerosis of coronary artery bypass graft(s), unspecified, with unspecified angina pectoris: Secondary | ICD-10-CM

## 2022-06-04 DIAGNOSIS — N183 Chronic kidney disease, stage 3 unspecified: Secondary | ICD-10-CM

## 2022-06-04 DIAGNOSIS — H538 Other visual disturbances: Secondary | ICD-10-CM

## 2022-06-04 DIAGNOSIS — E538 Deficiency of other specified B group vitamins: Secondary | ICD-10-CM

## 2022-06-04 DIAGNOSIS — K529 Noninfective gastroenteritis and colitis, unspecified: Secondary | ICD-10-CM

## 2022-06-04 MED ORDER — FUROSEMIDE 40 MG PO TABS: 40.0000 mg | ORAL_TABLET | Freq: Every day | ORAL | 1 refills | Status: DC

## 2022-06-04 MED ORDER — ASPIRIN 325 MG PO TBEC: 325.0000 mg | DELAYED_RELEASE_TABLET | Freq: Every day | ORAL | 3 refills | Status: DC

## 2022-06-04 MED ORDER — GABAPENTIN 100 MG PO CAPS: 100.0000 mg | ORAL_CAPSULE | Freq: Every day | ORAL | 1 refills | Status: DC

## 2022-06-04 MED ORDER — POTASSIUM 99 MG PO TABS: 198.0000 mg | ORAL_TABLET | Freq: Every day | ORAL | 3 refills | Status: DC

## 2022-06-04 MED ORDER — LEVOTHYROXINE SODIUM 50 MCG PO TABS: 50.0000 ug | ORAL_TABLET | Freq: Every day | ORAL | 3 refills | Status: DC

## 2022-06-04 MED ORDER — PANTOPRAZOLE SODIUM 40 MG PO TBEC: 40.0000 mg | DELAYED_RELEASE_TABLET | Freq: Every day | ORAL | 2 refills | Status: DC

## 2022-06-04 MED ORDER — REPATHA SURECLICK 140 MG/ML ~~LOC~~ SOAJ: SUBCUTANEOUS | 3 refills | Status: DC

## 2022-06-04 MED ORDER — CYANOCOBALAMIN 1000 MCG/ML IJ SOLN
1000.0000 ug | Freq: Once | INTRAMUSCULAR | Status: AC
  Administered 2022-06-04: 1000 ug via INTRAMUSCULAR

## 2022-06-04 MED ORDER — CHOLECALCIFEROL 25 MCG (1000 UT) PO CAPS: 1000.0000 [IU] | ORAL_CAPSULE | Freq: Every day | ORAL | 3 refills | Status: DC

## 2022-06-04 NOTE — Telephone Encounter (Signed)
Left message for patient to call back  

## 2022-06-04 NOTE — Patient Instructions (Addendum)
It was very nice to see you today!  Will send meds to [facility]  Get shingles shots at pharmacy.   PLEASE NOTE:  If you had any lab tests please let us know if you have not heard back within a few days. You may see your results on MyChart before we have a chance to review them but we will give you a call once they are reviewed by [name]. If we ordered any referrals today, please let us know if you have not heard from their office within the next week.   Please try these tips to maintain a healthy lifestyle:  Eat most of your calories during the day when you are active. Eliminate processed foods including packaged sweets (pies, cakes, cookies), reduce intake of potatoes, white bread, white pasta, and white rice. Look for whole grain options, oat flour or almond flour.  Each meal should contain half fruits/vegetables, one quarter protein, and one quarter carbs (no bigger than a computer mouse).  Cut down on sweet beverages. This includes juice, soda, and sweet tea. Also watch fruit intake, though this is a healthier sweet option, it still contains natural sugar! Limit to 3 servings daily.  Drink at least 1 glass of water with each meal and aim for at least 8 glasses per day  Exercise at least 150 minutes every week.

## 2022-06-04 NOTE — Progress Notes (Signed)
Subjective:     Patient ID: [name], female    DOB: 29-Jan-1951, 71 y.o.   MRN: [id number]  Chief Complaint  Patient presents with   Follow-up    Referral to opthalmology.     HPI  CKD-saw neph. 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 Told hole in L eardrum-will get hearing test 8/28.   Vision bad-past 6 mo, worse.  Failed reading test at [facility].  Has had R cataract done. Ophth can't see her till Jan. Wants referral.  Diarrhea-saw GI PA. Going 5x'd some days.  Occ none.  Had CT scan 2 days ago.  Told to take metamucil and miralax daily for 2 wks. To r/o obstruction. Edema-lasix '40mg'$ -[facility].  Sens to bumex.   Health Maintenance Due  Topic Date Due   OPHTHALMOLOGY EXAM  Never done   URINE MICROALBUMIN  Never done   Hepatitis C Screening  Never done   Pneumonia Vaccine 31+ Years old (84 - PPSV23 if available, else PCV20) 10/09/2019   Zoster Vaccines- Shingrix (2 of 2) 02/23/2021    Past Medical History:  Diagnosis Date   Allergy    Anemia    Arthritis    Asthma    Dianosed years ago-no meds at this time   Cancer [facility]) 2021   endometrial    Cataract    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 name of MD    Dementia ([place])    Diabetes mellitus    type 2    Diabetes mellitus without complication ([place])    Dyslipidemia    Dyspnea    with exertion    Dysrhythmia    "skips beats"   Endometrial cancer ([place]) 2021   Family history of adverse reaction to anesthesia    son stopped breathing during surgery , mother slow to wake up    Fibromyalgia 1980's   Generalized anxiety disorder    GERD (gastroesophageal reflux disease)    occ- will use Tums and Prolosec  prn    Heart murmur    Hyperlipemia    Hypertension    not on blood pressure meds due to allergies - last dose of meds- months ago     Hypertension    Hypothyroidism    Hypothyroidism (acquired)    Irritable bowel syndrome 1980's   Liver cancer ([place])    Major depressive disorder    Migraine headaches 1980's   On meds-well controlled   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   Morbid obesity ([place])    Pernicious anemia    Pneumonia 2009   Several times over the past several years   PONV (postoperative nausea and vomiting)    was told by neurologist due to calcificaton in brain not to be put to sleep, N/V during Op and Recovery    Recurrent upper respiratory infection (URI)    Sleep apnea    no cpap   Subarachnoid hemorrhage ([place]) 2010   Vaginal polyp    benign per pt    Past Surgical History:  Procedure Laterality Date   CATARACT EXTRACTION Right    CHOLECYSTECTOMY  1983   COLONOSCOPY  2008   DB   DILATION AND CURETTAGE OF UTERUS  1972   HYSTEROSCOPY WITH D & C  06/29/2011   Procedure: DILATATION AND CURETTAGE (D&C) /  HYSTEROSCOPY;  Surgeon: Donnamae Jude, MD;  Location: [facility];  Service: Gynecology;  Laterality: N/A;   ROBOTIC ASSISTED TOTAL HYSTERECTOMY WITH BILATERAL SALPINGO OOPHERECTOMY N/A 10/27/2020   Procedure: XI ROBOTIC ASSISTED TOTAL HYSTERECTOMY WITH BILATERAL SALPINGO OOPHORECTOMY;  Surgeon: Everitt Amber, MD;  Location: [facility];  Service: Gynecology;  Laterality: N/A;   SENTINEL NODE BIOPSY N/A 10/27/2020   Procedure: SENTINEL NODE BIOPSY;  Surgeon: Everitt Amber, MD;  Location: [facility];  Service: Gynecology;  Laterality: N/A;    Outpatient Medications Prior to Visit  Medication Sig Dispense Refill   acetaminophen (TYLENOL) 500 MG tablet Take 500 mg by mouth every 6 (six) hours as needed for moderate pain.     cyanocobalamin (,VITAMIN B-12,) 1000 MCG/ML injection Inject 1,000 mcg into the muscle every 30 (thirty) days.      diphenoxylate-atropine (LOMOTIL) 2.5-0.025 MG tablet TAKE 1 TABLET BY MOUTH TWICE DAILY AS NEEDED FOR DIARRHEA OR  LOOSE  STOOLS 60 tablet 2   estradiol (ESTRACE) 0.1 MG/GM vaginal  cream Place 0.5 g vaginally 2 (two) times a week. Place 0.5g nightly for two weeks then twice a week after 30 g 11   Multiple Vitamin (MULTIVITAMIN WITH MINERALS) TABS tablet Take 1 tablet by mouth daily. 30 tablet 0   polyethylene glycol (MIRALAX / GLYCOLAX) 17 g packet Take 17 g by mouth daily as needed for mild constipation. 14 each 0   aspirin 325 MG EC tablet Take 1 tablet (325 mg total) by mouth daily. 30 tablet 0   calamine lotion Apply 1 application topically as needed for itching.  0   Cholecalciferol 25 MCG (1000 UT) capsule      Ensure Max Protein (ENSURE MAX PROTEIN) LIQD Take 330 mLs (11 oz total) by mouth 2 (two) times daily.     Evolocumab (REPATHA SURECLICK) 683 MG/ML SOAJ INJECT 1 PEN SUBCUTANEOUSLY EVERY 14 DAYS 2 mL 11   furosemide (LASIX) 40 MG tablet Take 40 mg by mouth daily.     gabapentin (NEURONTIN) 100 MG capsule Take 1 capsule (100 mg total) by mouth 3 (three) times daily. 90 capsule 0   levothyroxine (SYNTHROID) 50 MCG tablet Take 1 tablet (50 mcg total) by mouth daily. 90 tablet 1   MAGNESIUM PO Take by mouth.     nutrition supplement, JUVEN, (JUVEN) PACK Take 1 packet by mouth 2 (two) times daily between meals.  0   pantoprazole (PROTONIX) 40 MG tablet Take 1 tablet (40 mg total) by mouth daily. 30 tablet 2   Potassium 99 MG TABS Take 198 mg by mouth daily.     senna (SENOKOT) 8.6 MG TABS tablet Take 1 tablet (8.6 mg total) by mouth 2 (two) times daily. 60 tablet 0   primidone (MYSOLINE) 50 MG tablet Take 1 tablet (50 mg total) by mouth at bedtime. (Patient not taking: Reported on 06/04/2022) 90 tablet 1   No facility-administered medications prior to visit.    Allergies  Allergen Reactions   Atenolol     Stomach problems, chills   Atorvastatin     Other reaction(s): retain fld   Canagliflozin     ALL DIABETIC MEDS PER PT   abdominal pain, headache   Losartan Potassium     chills, HA, stomach pain   Acarbose Nausea Only     headache,nausea   Cinnamon      Stomach pain   Empagliflozin      headaches   Glimepiride     ALL DIABETIC MEDS PER PT  abdominal pain, headache   Metformin And Related Nausea And Vomiting and Other (See Comments)    Patient states that she has severe chills, headache and cramping additionally. Says blood sugar is uncontrolled because of this   Other Other (See Comments)    ALL DIABETIC MEDS PER PT    Pioglitazone Other (See Comments)    ALL DIABETIC MEDS PER PT  Other reaction(s): peripheral edema   Pravastatin     Other reaction(s): retain fld   Sitagliptin     ALL DIABETIC MEDS PER PT  Other reaction(s): headache   Spironolactone-Hctz Other (See Comments)   Statins     Myalgia   Sulfamethoxazole Other (See Comments)    ALL DIABETIC MEDS PER PT    Tramadol Hcl Itching   Acarbose Other (See Comments)   Atenolol Other (See Comments)   Atorvastatin Other (See Comments)   Atorvastatin Calcium     Other reaction(s): retain fld   Canagliflozin Other (See Comments)   Crestor [Rosuvastatin] Other (See Comments)   Empagliflozin Other (See Comments)   Glimepiride Other (See Comments)   Losartan Potassium Other (See Comments)   Metformin Other (See Comments)   Other Other (See Comments)   Pioglitazone Other (See Comments)   Pravastatin Other (See Comments)   Rosuvastatin Calcium    Spironolactone-Hctz Other (See Comments)   Tramadol    Bumex [Bumetanide] Rash    Chills, rash, ha, stomach,   Sulfa Antibiotics Rash and Other (See Comments)    Mother has told patient in the past not to take, she cannot recall there reaction   ROS neg/noncontributory except as noted HPI/below Breathing better on lasix '40mg'$ .  Edema better.        Objective:     BP 138/60 (BP Location: Left Arm, Patient Position: Sitting, Cuff Size: Large)   Pulse 75   Temp 98.3 F (36.8 C) (Temporal)   Ht '5\' 2"'$  (1.575 m)   Wt 184 lb 8 oz (83.7 kg)   SpO2 99%   BMI 33.75 kg/m  Wt Readings from Last 3 Encounters:  06/04/22 184 lb 8  oz (83.7 kg)  05/30/22 183 lb (83 kg)  05/04/22 186 lb (84.4 kg)    Physical Exam   Gen: WDWN NAD wf HEENT: NCAT, conjunctiva not injected, sclera nonicteric NECK:  supple, no thyromegaly, no nodes, no carotid bruits CARDIAC: RRR, S1S2+, 1/6  murmur. DP 1+B LUNGS: CTAB. No wheezes ABDOMEN:  BS+, soft, NTND, No HSM, no masses EXT: 1+ edema MSK: cane  NEURO: A&O x3.  CN II-XII intact.  PSYCH: normal mood. Good eye contact     Assessment & Plan:   Problem List Items Addressed This Visit       Cardiovascular and Mediastinum   CAD (coronary artery disease)   Relevant Medications   aspirin EC 325 MG tablet   Evolocumab (REPATHA SURECLICK) 481 MG/ML SOAJ   furosemide (LASIX) 40 MG tablet     Digestive   Chronic diarrhea     Endocrine   Type 2 diabetes mellitus with stage 3 chronic kidney disease, without long-term current use of insulin (HCC)   Relevant Medications   aspirin EC 325 MG tablet     Genitourinary   Kidney disease     Other   B12 deficiency   Other Visit Diagnoses     Blurry vision, bilateral    -  Primary     Chronic kidney disease stage III-chronic.  Stable.  Seen nephrologist.  He has done  recent lab work and will be repeating at the end of the month.  Advised that I need copies of the labs and his notes.  I will prescribe her Lasix 40 mg daily so she can have bubble packs, but if nephrology changes doses, adds other medications, I will need to know. 2.  Diabetes type 2 with chronic kidney disease stage III-chronic.  Stable.  Patient is intolerant of multiple medications.  She tries to manage with diet, but is not doing a great job.  Needs to update ophthalmology exam.  She is requesting a referral 3.  Blurry vision-getting worse.  She would like a referral to ophthalmology 4.  Chronic diarrhea-seeing GI.  Currently on MiraLAX and Benefiber as there is concern about potential blockage.  Per patient had a CAT scan done (I do not see results in epic yet)  care per GI 5.  History of coronary artery disease-chronic.  Stable.  Due to multiple drug intolerances, she is currently on Repatha.  Will send prescription to family pharmacy.  Continue meds 6.  Vitamin B12 deficiency-injection given today.  Continue monthly injections F/u 4-6 wks-complex pt  Meds ordered this encounter  Medications   aspirin EC 325 MG tablet    Sig: Take 1 tablet (325 mg total) by mouth daily.    Dispense:  90 tablet    Refill:  3    Pt wants all meds in bubble packs except protonix please   Cholecalciferol 25 MCG (1000 UT) capsule    Sig: Take 1 capsule (1,000 Units total) by mouth daily.    Dispense:  90 capsule    Refill:  3    Pt wants in bubble packs   Evolocumab (REPATHA SURECLICK) 409 MG/ML SOAJ    Sig: INJECT 1 PEN SUBCUTANEOUSLY EVERY 14 DAYS    Dispense:  6 mL    Refill:  3   furosemide (LASIX) 40 MG tablet    Sig: Take 1 tablet (40 mg total) by mouth daily.    Dispense:  90 tablet    Refill:  1    Pt wants in bubble pack   gabapentin (NEURONTIN) 100 MG capsule    Sig: Take 1 capsule (100 mg total) by mouth at bedtime.    Dispense:  90 capsule    Refill:  1    Pt wants in bubble pack   levothyroxine (SYNTHROID) 50 MCG tablet    Sig: Take 1 tablet (50 mcg total) by mouth daily.    Dispense:  90 tablet    Refill:  3    Wants in bubble pack   pantoprazole (PROTONIX) 40 MG tablet    Sig: Take 1 tablet (40 mg total) by mouth daily.    Dispense:  30 tablet    Refill:  2    Wants in bottle, not bubble pack   Potassium 99 MG TABS    Sig: Take 2 tablets (198 mg total) by mouth daily.    Dispense:  180 tablet    Refill:  3    Wants in bubble pack   cyanocobalamin ((VITAMIN B-12)) injection 1,000 mcg    Wellington Hampshire, MD

## 2022-06-05 ENCOUNTER — Other Ambulatory Visit: Payer: Self-pay

## 2022-06-05 MED ORDER — ONDANSETRON HCL 4 MG PO TABS: 4.0000 mg | ORAL_TABLET | Freq: Three times a day (TID) | ORAL | 0 refills | Status: DC | PRN

## 2022-06-05 NOTE — Telephone Encounter (Signed)
Spoke with patient & she is aware that prescription has been sent to pharmacy. Patient requested that medication be sent to [facility], however for future references to send to [facility] pharmacy. She has been advised to stop taking medication & call back if she develops any reaction. Patient verbalized all understanding, no further questions.

## 2022-06-06 ENCOUNTER — Other Ambulatory Visit: Payer: Medicare Other

## 2022-06-06 ENCOUNTER — Encounter: Payer: Self-pay | Admitting: Podiatry

## 2022-06-06 ENCOUNTER — Ambulatory Visit (INDEPENDENT_AMBULATORY_CARE_PROVIDER_SITE_OTHER): Payer: Medicare Other | Admitting: Podiatry

## 2022-06-06 DIAGNOSIS — K580 Irritable bowel syndrome with diarrhea: Secondary | ICD-10-CM

## 2022-06-06 DIAGNOSIS — M79675 Pain in left toe(s): Secondary | ICD-10-CM | POA: Diagnosis not present

## 2022-06-06 DIAGNOSIS — B351 Tinea unguium: Secondary | ICD-10-CM

## 2022-06-06 DIAGNOSIS — M79674 Pain in right toe(s): Secondary | ICD-10-CM

## 2022-06-06 DIAGNOSIS — E1165 Type 2 diabetes mellitus with hyperglycemia: Secondary | ICD-10-CM

## 2022-06-06 NOTE — Progress Notes (Signed)
This patient returns to my office for at risk foot care.  This patient requires this care by a professional since this patient will be at risk due to having diabetes and kidney disease. This patient is unable to cut nails herself since the patient cannot reach her nails.These nails are painful walking and wearing shoes.  This patient presents for at risk foot care today.  General Appearance  Alert, conversant and in no acute stress.  Vascular  Dorsalis pedis and posterior tibial  pulses are palpable  bilaterally.  Capillary return is within normal limits  bilaterally. Temperature is within normal limits  bilaterally.  Neurologic  Senn-Weinstein monofilament wire test within normal limits  bilaterally. Muscle power within normal limits bilaterally.  Nails Thick disfigured discolored nails with subungual debris  hallux nails bilaterally. No evidence of bacterial infection or drainage bilaterally.  Orthopedic  No limitations of motion  feet .  No crepitus or effusions noted.  No bony pathology or digital deformities noted.  HAV  B/L.  Midfoot  DJD  B/L.  Skin  normotropic skin with no porokeratosis noted bilaterally.  No signs of infections or ulcers noted.     Onychomycosis  Pain in right toes  Pain in left toes  Consent was obtained for treatment procedures.   Mechanical debridement of nails 1-5  bilaterally performed with a nail nipper.  Filed with dremel without incident.    Return office visit     3 months                 Told patient to return for periodic foot care and evaluation due to potential at risk complications.   Gardiner Barefoot DPM

## 2022-06-07 ENCOUNTER — Other Ambulatory Visit: Payer: Self-pay

## 2022-06-07 LAB — GI PROFILE, STOOL, PCR

## 2022-06-07 LAB — CLOSTRIDIUM DIFFICILE TOXIN B, QUALITATIVE, REAL-TIME PCR: Toxigenic C. Difficile by PCR: NOT DETECTED

## 2022-06-07 MED ORDER — CIPROFLOXACIN HCL 500 MG PO TABS: 500.0000 mg | ORAL_TABLET | Freq: Two times a day (BID) | ORAL | 0 refills | Status: AC

## 2022-06-08 ENCOUNTER — Telehealth: Payer: Self-pay | Admitting: Family Medicine

## 2022-06-08 ENCOUNTER — Telehealth: Payer: Self-pay

## 2022-06-11 ENCOUNTER — Telehealth: Payer: Self-pay | Admitting: Family Medicine

## 2022-06-13 ENCOUNTER — Other Ambulatory Visit: Payer: Self-pay | Admitting: Gastroenterology

## 2022-06-13 ENCOUNTER — Other Ambulatory Visit: Payer: Self-pay | Admitting: Physician Assistant

## 2022-06-14 ENCOUNTER — Telehealth: Payer: Self-pay | Admitting: Family Medicine

## 2022-06-14 ENCOUNTER — Encounter: Payer: Self-pay | Admitting: *Deleted

## 2022-06-14 ENCOUNTER — Other Ambulatory Visit: Payer: Self-pay | Admitting: *Deleted

## 2022-06-14 MED ORDER — ACCU-CHEK FASTCLIX LANCET KIT: PACK | 0 refills | Status: DC

## 2022-06-14 MED ORDER — ACCU-CHEK GUIDE VI STRP: ORAL_STRIP | 2 refills | Status: AC

## 2022-06-14 MED ORDER — ACCU-CHEK GUIDE W/DEVICE KIT: PACK | 0 refills | Status: DC

## 2022-06-14 NOTE — Telephone Encounter (Signed)
Patient called in stating she met with her kidney doctor yesterday & he agreed with the antibiotic regimen that she was previously on. She also has complaints of constipation & intermittent abdominal cramping. She completed antibiotics on Tuesday. She has not had a bowel movement in 3 days. Patient has been advised to start back Miralax & Benefiber daily as previously recommended on 6/15 (abd xray) for stool burden, and to call back if symptoms worsen or unrelieved. She currently has a f/u scheduled for 7/7 with [place], [place].

## 2022-06-14 NOTE — Telephone Encounter (Signed)
Rx's sent to the pharmacy while on the phone with the patient.

## 2022-06-14 NOTE — Telephone Encounter (Signed)
Patient called requesting to urgently speak with a nurse said she recently spoke with a kidney specialist.

## 2022-06-14 NOTE — Telephone Encounter (Signed)
Pt states she has lost her entire "Accucheck fast click"system. Believes it has fallen into the recliner and cannot get it out.     LAST APPOINTMENT DATE:   06/04/22 OV with PCP  NEXT APPOINTMENT DATE: 07/16/22 OV with PCP   MEDICATION: - Lancets  - Test strips  - Glucose Reader  Is the patient out of medication?  yes   PHARMACY: [facility] - [place], [place] - [address]  [address], [place] [address]  Phone:  [phone]  Fax:  [phone]

## 2022-06-15 NOTE — Telephone Encounter (Signed)
Patient called requesting to urgently speak with a nurse stating that her symptoms started up again around 1:30 and is wanting a call  back before we close. Please advise.

## 2022-06-15 NOTE — Telephone Encounter (Signed)
Returned call to patient. She states that she had not had a BM in 3 days. Her abdomen was gurgling some last night. She reports that she only had one dose of Miralax since stopping the antibiotics. Pt reports diarrhea today. I told pt that it is possible that she is still experiencing overflow diarrhea. I have advised pt on how to complete a Miralax bowel purge. Pt was instructed to complete this when she has nowhere to go. Pt reports that Miralax has never worked for her or anyone in her family. I told her that we usually see good results with Miralax purge, she should see results within a few hours after completion. I did tell patient that she may not have a BM for a few days after the bowel purge as we are giving her a cleanse. Pt knows to start Miralax 1-2 times daily after the purge. Pt has also been advised to stay hydrated after this. Pt knows that we will be in the office on 7/3, but not 7/4. Pt is concerned about [name] being closed on 06/19/22. I told her that she could always go to an urgent care, but she has an appt on 06/22/22 and will hopefully start to feel better soon. Pt verbalized understanding and had no concerns at the end of the call.

## 2022-06-21 NOTE — Progress Notes (Signed)
06/22/2022 [name] [id number] July 09, 1951  Referring provider: [name], MD Primary GI doctor: [name]  ASSESSMENT AND PLAN:   Irritable bowel syndrome with diarrhea now with constipation -     DG Abd 1 View; Future -     ciprofloxacin (CIPRO) 500 MG tablet; Take 1 tablet (500 mg total) by mouth 2 (two) times daily for 10 days. - had constipation on last Xray, I do think there is a pelvic floor component with vaginal issues as well.  Patient did do really well on cipro for yersinia enterocolitica for 5 days, which does make me wonder if possible SIBO methane producing, will send in cipro 10 days, if this does not help can consider linzess.  Stop fiber, continue miralax at this time,  If not helping can consider repeat colonoscopy to evaluate further  H/O adenomatous polyp of colon 05/2018 colonoscopy 2 small sessile polyps removed 1 tubular adenoma, diverticulosis internal hemorrhoid, recall 5 years. (05/2023)  Abnormal urination Will check urine but likely more pelvic floor dysfunction -     Urinalysis, Routine w reflex microscopic; Future -     ciprofloxacin (CIPRO) 500 MG tablet; Take 1 tablet (500 mg total) by mouth 2 (two) times daily for 10 days.   History of Present Illness:  71 y.o. female  with a past medical history of type 2 diabetes, GERD, anxiety, hypertension, hyperlipidemia hypothyroidism, obesity history of subarachnoid hemorrhage 2010, endometrial cancer 2021, anemia, IBS mixed constipation and diarrhea and others listed below, returns to clinic today for evaluation of IBS.  Status post hysterectomy, BSO and lymph node sampling 2021 stage I endometrial cancer. 05/2018 colonoscopy 2 small sessile polyps removed 1 tubular adenoma, diverticulosis internal hemorrhoid, recall 5 years. (05/2023) 02/08/2022 hemoglobin 10.5, 4.0, MCV 89.6, platelets 268  05/30/2022 seen patient for worsening diarrhea At that time patient was having worsening loose  stools could be up to 8-10 a day.   Was taken off amitriptyline and started on gabapentin, has had antibiotics in the last 6 months.  Was also complaining of increased stress and depression. Iron 41, ferritin 45, saturation ratio 10.4, hemoglobin 12 CRP unremarkable  C. difficile negative, thyroid normal Positive for yersinia enterocolitica, given Cipro 500 mg twice daily for 5 days Zofran as needed for nausea She states while on the ABX she felt great  for those 5 days, less bloating, less issues.   She has prolapsed vagina per patient and she has been having some abnormal urinating. No fever, she has constant chills. Some burning with urination.   06/08/2022 patient complaining not having bowel movements for 3 days patient instructed to go for bowel purge and ER precautions discussed She did the bowel purge x 2 and then only has 2 BM's after that. She is on miralax 1-2 x daily and is on benefiber 1-2 x a day.  She is having diarrhea, no formed poop, having small loose stool on her pads.  She has some mild AB cramping and nausea, she only took a few. She has been off the lomitil.  Denies blood in the stool.  Declines rectal exam today.  03/06/2021 negative GI pathogen, O&P, fecal elastase She thinks she has lasix allergy, talk with [place] kidney doctor.    Current Medications:   Current Outpatient Medications (Endocrine & Metabolic):    levothyroxine (SYNTHROID) 50 MCG tablet, Take 1 tablet (50 mcg total) by mouth daily.  Current Outpatient Medications (Cardiovascular):    Evolocumab (REPATHA SURECLICK) 417 MG/ML SOAJ,  INJECT 1 PEN SUBCUTANEOUSLY EVERY 14 DAYS   furosemide (LASIX) 40 MG tablet, Take 1 tablet (40 mg total) by mouth daily.   Current Outpatient Medications (Analgesics):    acetaminophen (TYLENOL) 500 MG tablet, Take 500 mg by mouth every 6 (six) hours as needed for moderate pain.   aspirin EC 325 MG tablet, Take 1 tablet (325 mg total) by mouth daily.  Current  Outpatient Medications (Hematological):    cyanocobalamin (,VITAMIN B-12,) 1000 MCG/ML injection, Inject 1,000 mcg into the muscle every 30 (thirty) days.   Current Outpatient Medications (Other):    Blood Glucose Monitoring Suppl (ACCU-CHEK GUIDE) w/Device KIT, Use to test blood sugar 3 times daily.   Cholecalciferol 25 MCG (1000 UT) capsule, Take 1 capsule (1,000 Units total) by mouth daily.   ciprofloxacin (CIPRO) 500 MG tablet, Take 1 tablet (500 mg total) by mouth 2 (two) times daily for 10 days.   diphenoxylate-atropine (LOMOTIL) 2.5-0.025 MG tablet, TAKE 1 TABLET BY MOUTH TWICE DAILY AS NEEDED FOR DIARRHEA OR  LOOSE  STOOLS   estradiol (ESTRACE) 0.1 MG/GM vaginal cream, Place 0.5 g vaginally 2 (two) times a week. Place 0.5g nightly for two weeks then twice a week after   gabapentin (NEURONTIN) 100 MG capsule, Take 1 capsule (100 mg total) by mouth at bedtime.   glucose blood (ACCU-CHEK GUIDE) test strip, Use to test blood sugar 3 times daily   Lancets Misc. (ACCU-CHEK FASTCLIX LANCET) KIT, Use to check blood sugar 3 times daily   Multiple Vitamin (MULTIVITAMIN WITH MINERALS) TABS tablet, Take 1 tablet by mouth daily.   ondansetron (ZOFRAN) 4 MG tablet, Take 1 tablet (4 mg total) by mouth every 8 (eight) hours as needed for nausea or vomiting.   pantoprazole (PROTONIX) 40 MG tablet, TAKE 1 TABLET BY MOUTH DAILY   polyethylene glycol (MIRALAX / GLYCOLAX) 17 g packet, Take 17 g by mouth daily as needed for mild constipation.   Potassium 99 MG TABS, Take 2 tablets (198 mg total) by mouth daily.  Surgical History:  She  has a past surgical history that includes Dilation and curettage of uterus (1972); Cholecystectomy (1983); Hysteroscopy with D & C (06/29/2011); Colonoscopy (2008); Robotic assisted total hysterectomy with bilateral salpingo oophorectomy (N/A, 10/27/2020); Sentinel node biopsy (N/A, 10/27/2020); and Cataract extraction (Right). Family History:  Her family history includes  Anesthesia problems in her granddaughter, mother, and son; Arthritis in an other family member; Bladder Cancer in her father; Cerebrovascular Disease in her mother; Colon cancer in her father; Diabetes in an other family member; High blood pressure in an other family member; Liver cancer in her father; Schizophrenia in her child and daughter; Tremor in her child and son. Social History:   reports that she has never smoked. She has never used smokeless tobacco. She reports that she does not drink alcohol and does not use drugs.  Current Medications, Allergies, Past Medical History, Past Surgical History, Family History and Social History were reviewed in [employer] record.  Physical Exam: BP 122/78   Pulse 89   Ht '5\' 2"'  (1.575 m)   Wt 180 lb 6.4 oz (81.8 kg)   SpO2 97%   BMI 33.00 kg/m  General:   Pleasant, well developed female in no acute distress Heart : Regular rate and rhythm; systolic murmur Pulm: Clear anteriorly; no wheezing Abdomen:  Soft, Obese AB, Active bowel sounds. mild tenderness in the entire abdomen. Without guarding and Without rebound, No organomegaly appreciated. Rectal: declines Extremities:  without  edema. Neurologic:  Alert and  oriented x4;  No focal deficits. Walking with cane.  Psych:  Cooperative. Normal mood and affect.   Vladimir Crofts, PA-C 06/22/22

## 2022-06-22 ENCOUNTER — Encounter: Payer: Self-pay | Admitting: Physician Assistant

## 2022-06-22 ENCOUNTER — Ambulatory Visit (INDEPENDENT_AMBULATORY_CARE_PROVIDER_SITE_OTHER)
Admission: RE | Admit: 2022-06-22 | Discharge: 2022-06-22 | Disposition: A | Discharge status: Home / Self Care | Payer: Medicare Other | Source: Ambulatory Visit | Attending: Physician Assistant | Admitting: Physician Assistant

## 2022-06-22 ENCOUNTER — Other Ambulatory Visit (INDEPENDENT_AMBULATORY_CARE_PROVIDER_SITE_OTHER): Payer: Medicare Other

## 2022-06-22 ENCOUNTER — Telehealth: Payer: Self-pay | Admitting: Family Medicine

## 2022-06-22 ENCOUNTER — Ambulatory Visit (INDEPENDENT_AMBULATORY_CARE_PROVIDER_SITE_OTHER): Payer: Medicare Other | Admitting: Physician Assistant

## 2022-06-22 VITALS — BP 122/78 | HR 89 | Ht 62.0 in | Wt 180.4 lb

## 2022-06-22 DIAGNOSIS — K580 Irritable bowel syndrome with diarrhea: Secondary | ICD-10-CM

## 2022-06-22 DIAGNOSIS — R39198 Other difficulties with micturition: Secondary | ICD-10-CM

## 2022-06-22 DIAGNOSIS — K5900 Constipation, unspecified: Secondary | ICD-10-CM

## 2022-06-22 DIAGNOSIS — Z86010 Personal history of colonic polyps: Secondary | ICD-10-CM

## 2022-06-22 LAB — URINALYSIS, ROUTINE W REFLEX MICROSCOPIC
Ketones, ur: NEGATIVE
Nitrite: NEGATIVE
Specific Gravity, Urine: 1.015 (ref 1.000–1.030)
Total Protein, Urine: 30 — AB
Urine Glucose: NEGATIVE
Urobilinogen, UA: 1 (ref 0.0–1.0)
pH: 6 (ref 5.0–8.0)

## 2022-06-22 MED ORDER — CIPROFLOXACIN HCL 500 MG PO TABS: 500.0000 mg | ORAL_TABLET | Freq: Two times a day (BID) | ORAL | 0 refills | Status: AC

## 2022-06-22 NOTE — Patient Instructions (Addendum)
You are scheduled for a follow up with Dr Silverio Decamp on 08/09/22 at 8:50 am.  Your provider has requested that you go to the basement level for lab work before leaving today. Press "B" on the elevator. The lab is located at the first door on the left as you exit the elevator.  Continue miralax ONCE A DAY Stop the fiber to see if this helps with the bowels.   We may want to evaluate you for small intestinal bacterial overgrowth, this can cause increase gas, bloating, loose stools or constipation.  There is a test for this we can do or sometimes we will treat a patient with an antibiotic to see if it helps. Since you had a response to the cipro, will give complete course for this.  Stop if you have any [place]  Linzess -WILL NOT GIVE YOU UNTIL AFTER YOU COMPLETE YOUR ANTIBIOTIC IF NOT BETTER And you would stop the miralax Take at least 30 minutes before the first meal of the day on an empty stomach You can have a loose stool if you eat a high-fat breakfast. Give it at least 4 days, may have more bowel movements during that time.  Do it over the weekend.  If you continue to have severe diarrhea try every other day, if you get AB pain with it stop and call our office.   Here some information about pelvic floor dysfunction. This may be contributing to some of your symptoms.   Pelvic Floor Dysfunction, Female Pelvic floor dysfunction (PFD) is a condition that results when the group of muscles and connective tissues that support the organs in the pelvis (pelvic floor muscles) do not work well. These muscles and their connections form a sling that supports the colon and bladder. In women, they also support the uterus. PFD causes pelvic floor muscles to be too weak, too tight, or both. In PFD, muscle movements are not coordinated. This may cause bowel or bladder problems. It may also cause pain. What are the causes? This condition may be caused by an injury to the pelvic area or by a weakening of  pelvic muscles. This often results from pregnancy and childbirth or other types of strain. In many cases, the exact cause is not known. What increases the risk? The following factors may make you more likely to develop this condition: Having chronic bladder tissue inflammation (interstitial cystitis). Being an older person. Being overweight. History of radiation treatment for cancer in the pelvic region. Previous pelvic surgery, such as removal of the uterus (hysterectomy). What are the signs or symptoms? Symptoms of this condition vary and may include: Bladder symptoms, such as: Trouble starting urination and emptying the bladder. Frequent urinary tract infections. Leaking urine when coughing, laughing, or exercising (stress incontinence). Having to pass urine urgently or frequently. Pain when passing urine. Bowel symptoms, such as: Constipation. Urgent or frequent bowel movements. Incomplete bowel movements. Painful bowel movements. Leaking stool or gas. Unexplained genital or rectal pain. Genital or rectal muscle spasms. Low back pain. Other symptoms may include: A heavy, full, or aching feeling in the vagina. A bulge that protrudes into the vagina. Pain during or after sex. How is this diagnosed? This condition may be diagnosed based on: Your symptoms and medical history. A physical exam. During the exam, your health care provider may check your pelvic muscles for tightness, spasm, pain, or weakness. This may include a rectal exam and a pelvic exam. In some cases, you may have diagnostic tests, such as:  Electrical muscle function tests. Urine flow testing. X-ray tests of bowel function. Ultrasound of the pelvic organs. How is this treated? Treatment for this condition depends on the symptoms. Treatment options include: Physical therapy. This may include Kegel exercises to help relax or strengthen the pelvic floor muscles. Biofeedback. This type of therapy provides  feedback on how tight your pelvic floor muscles are so that you can learn to control them. Internal or external massage therapy. A treatment that involves electrical stimulation of the pelvic floor muscles to help control pain (transcutaneous electrical nerve stimulation, or TENS). Sound wave therapy (ultrasound) to reduce muscle spasms. Medicines, such as: Muscle relaxants. Bladder control medicines. Surgery to reconstruct or support pelvic floor muscles may be an option if other treatments do not help. Follow these instructions at home: Activity Do your usual activities as told by your health care provider. Ask your health care provider if you should modify any activities. Do pelvic floor strengthening or relaxing exercises at home as told by your physical therapist. Lifestyle Maintain a healthy weight. Eat foods that are high in fiber, such as beans, whole grains, and fresh fruits and vegetables. Limit foods that are high in fat and processed sugars, such as fried or sweet foods. Manage stress with relaxation techniques such as yoga or meditation. General instructions If you have problems with leakage: Use absorbable pads or wear padded underwear. Wash frequently with mild soap. Keep your genital and anal area as clean and dry as possible. Ask your health care provider if you should try a barrier cream to prevent skin irritation. Take warm baths to relieve pelvic muscle tension or spasms. Take over-the-counter and prescription medicines only as told by your health care provider. Keep all follow-up visits. How is this prevented? The cause of PFD is not always known, but there are a few things you can do to reduce the risk of developing this condition, including: Staying at a healthy weight. Getting regular exercise. Managing stress. Contact a health care provider if: Your symptoms are not improving with home care. You have signs or symptoms of PFD that get worse at home. You  develop new signs or symptoms. You have signs of a urinary tract infection, such as: Fever. Chills. Increased urinary frequency. A burning feeling when urinating. You have not had a bowel movement in 3 days (constipation). Summary Pelvic floor dysfunction results when the muscles and connective tissues in your pelvic floor do not work well. These muscles and their connections form a sling that supports your colon and bladder. In women, they also support the uterus. PFD may be caused by an injury to the pelvic area or by a weakening of pelvic muscles. PFD causes pelvic floor muscles to be too weak, too tight, or a combination of both. Symptoms may vary from person to person. In most cases, PFD can be treated with physical therapies and medicines. Surgery may be an option if other treatments do not help. This information is not intended to replace advice given to you by your health care provider. Make sure you discuss any questions you have with your health care provider. Document Revised: 04/12/2021 Document Reviewed: 04/12/2021 Elsevier Patient Education  [place].

## 2022-06-22 NOTE — Telephone Encounter (Signed)
error 

## 2022-06-29 LAB — PROTEIN / CREATININE RATIO, URINE
Albumin, U: 22.3
Creatinine, Urine: 45.7

## 2022-06-29 LAB — BASIC METABOLIC PANEL
BUN: 27 — AB (ref 4–21)
CO2: 32 — AB (ref 13–22)
Chloride: 101 (ref 99–108)
Creatinine: 1.2 — AB (ref 0.5–1.1)
Glucose: 174
Potassium: 3.9 mEq/L (ref 3.5–5.1)
Sodium: 140 (ref 137–147)

## 2022-06-29 LAB — MICROALBUMIN / CREATININE URINE RATIO: Microalb Creat Ratio: 49

## 2022-06-29 LAB — CBC AND DIFFERENTIAL: Hemoglobin: 11.6 — AB (ref 12.0–16.0)

## 2022-06-29 LAB — COMPREHENSIVE METABOLIC PANEL
Albumin: 4.2 (ref 3.5–5.0)
Calcium: 9.6 (ref 8.7–10.7)
eGFR: 49

## 2022-07-02 ENCOUNTER — Ambulatory Visit (INDEPENDENT_AMBULATORY_CARE_PROVIDER_SITE_OTHER): Payer: Medicare Other | Admitting: *Deleted

## 2022-07-02 DIAGNOSIS — E538 Deficiency of other specified B group vitamins: Secondary | ICD-10-CM

## 2022-07-02 MED ORDER — CYANOCOBALAMIN 1000 MCG/ML IJ SOLN
1000.0000 ug | Freq: Once | INTRAMUSCULAR | Status: AC
  Administered 2022-07-02: 1000 ug via INTRAMUSCULAR

## 2022-07-02 NOTE — Progress Notes (Signed)
Per orders of Dr. Cherlynn Kaiser, injection of monthly B 12 given in right deltoid per patient preference by Zacarias Pontes, CMA. Patient tolerated injection well. Patient aware to schedule next injection.

## 2022-07-11 ENCOUNTER — Other Ambulatory Visit: Payer: Self-pay | Admitting: *Deleted

## 2022-07-11 DIAGNOSIS — I6523 Occlusion and stenosis of bilateral carotid arteries: Secondary | ICD-10-CM

## 2022-07-16 ENCOUNTER — Ambulatory Visit: Payer: Medicare Other | Admitting: Family Medicine

## 2022-07-24 ENCOUNTER — Encounter: Payer: Self-pay | Admitting: Family Medicine

## 2022-07-24 ENCOUNTER — Encounter
Payer: Medicare Other | Attending: Physical Medicine and Rehabilitation | Admitting: Physical Medicine and Rehabilitation

## 2022-07-24 ENCOUNTER — Encounter: Payer: Self-pay | Admitting: Physical Medicine and Rehabilitation

## 2022-07-24 ENCOUNTER — Ambulatory Visit (INDEPENDENT_AMBULATORY_CARE_PROVIDER_SITE_OTHER): Payer: Medicare Other | Admitting: Family Medicine

## 2022-07-24 VITALS — BP 109/75 | HR 89 | Ht 62.0 in | Wt 181.0 lb

## 2022-07-24 VITALS — BP 142/78 | HR 87 | Temp 97.8°F | Ht 62.0 in | Wt 180.4 lb

## 2022-07-24 DIAGNOSIS — R42 Dizziness and giddiness: Secondary | ICD-10-CM | POA: Insufficient documentation

## 2022-07-24 DIAGNOSIS — R197 Diarrhea, unspecified: Secondary | ICD-10-CM | POA: Diagnosis present

## 2022-07-24 DIAGNOSIS — R600 Localized edema: Secondary | ICD-10-CM

## 2022-07-24 DIAGNOSIS — G43711 Chronic migraine without aura, intractable, with status migrainosus: Secondary | ICD-10-CM | POA: Diagnosis present

## 2022-07-24 DIAGNOSIS — H538 Other visual disturbances: Secondary | ICD-10-CM

## 2022-07-24 DIAGNOSIS — E538 Deficiency of other specified B group vitamins: Secondary | ICD-10-CM

## 2022-07-24 MED ORDER — MECLIZINE HCL 12.5 MG PO TABS: 12.5000 mg | ORAL_TABLET | Freq: Three times a day (TID) | ORAL | 0 refills | Status: DC | PRN

## 2022-07-24 MED ORDER — BUMETANIDE 1 MG PO TABS: 1.0000 mg | ORAL_TABLET | Freq: Every day | ORAL | 0 refills | Status: DC

## 2022-07-24 MED ORDER — CYANOCOBALAMIN 1000 MCG/ML IJ SOLN
1000.0000 ug | Freq: Once | INTRAMUSCULAR | Status: AC
  Administered 2022-07-24: 1000 ug via INTRAMUSCULAR

## 2022-07-24 NOTE — Patient Instructions (Signed)
Constipation:  -Provided list of following foods that help with constipation and highlighted a few: 1) prunes- contain high amounts of fiber.  2) apples- has a form of dietary fiber called pectin that accelerates stool movement and increases beneficial gut bacteria 3) pears- in addition to fiber, also high in fructose and sorbitol which have laxative effect 4) figs- contain an enzyme ficin which helps to speed colonic transit 5) kiwis- contain an enzyme actinidin that improves gut motility and reduces constipation 6) oranges- rich in pectin (like apples) 7) grapefruits- contain a flavanol naringenin which has a laxative effect 8) vegetables- rich in fiber and also great sources of folate, vitamin C, and K 9) artichoke- high in inulin, prebiotic great for the microbiome 10) chicory- increases stool frequency and softness (can be added to coffee) 11) rhubarb- laxative effect 12) sweet potato- high fiber 13) beans, peas, and lentils- contain both soluble and insoluble fiber 14) chia seeds- improves intestinal health and gut flora 15) flaxseeds- laxative effect 16) whole grain rye bread- high in fiber 17) oat bran- high in soluble and insoluble fiber 18) kefir- softens stools -recommended to try at least one of these foods every day.  -drink 6-8 glasses of water per day -walk regularly, especially after meals.      

## 2022-07-24 NOTE — Patient Instructions (Signed)
Trying to get records from Kidney doc

## 2022-07-24 NOTE — Progress Notes (Signed)
Subjective:    Patient ID: [name], female    DOB: February 05, 1951, 71 y.o.   MRN: [id number]  HPI: [name] is a 71 y.o. female who returns for follow-up appointment of her T7 Vertebral Fracture, Essential Hypertension, Neuropathic Pain and Dizziness.  She presented to [facility] on 01/30/2022 with complaints of pain after a fall, she was transferred to [facility].   Dr Lorin Mercy: H&P HPI: [name] is a 71 y.o. female with medical history significant of DM, HTN, dementia, and pernicious anemia presenting with a fall.  She has been sick for a long time.  She went off amitriptyline for migraines and was 50% less dizzy and so wasn't falling TID.  She took some brain herbal for 5 days and it started making her dizzy (less than amitriptyline).  She came into the house and tripped.   She tried to break her fall with her hand.  Both hands are hurting severely.  She hasn't been able to sleep off the amitriptyline.  She is hurting in her shoulders.  She saw a neurosurgeon 8 months ago and "a compressed something" and there was a question about FARS.  She has Parkinson's-type symptoms.  She is having tremors at baseline.  She thinks she is not a candidate for surgery - elderly, anesthesia complications, calcifications in her brain.  She has decided she wants to see someone in [place].   CT Head: WO Contrast: CT Cervical Spine IMPRESSION: 1. No acute intracranial abnormality. 2. Small right frontal scalp hematoma without skull fracture. 3. No acute fracture or static subluxation of the cervical spine.  CT Thoracic Spine IMPRESSION: 1. No acute fracture or static subluxation of the thoracic spine. 2. Chronic compression deformity of L1.   Aortic Atherosclerosis (ICD10-I70.0).    MR Cervical Spine IMPRESSION: 1. Mild T7 compression fracture with a small amount of bone marrow edema, compatible with acute to subacute injury. Minimal height loss. 2. Multilevel  degenerative disc disease of the cervical spine with moderate C5-6 and mild C4-5 spinal canal stenosis. 3. Severe bilateral neural foraminal stenosis at C5-6.   MR Thoracic Spine:  IMPRESSION: 1. Mild T7 compression fracture with a small amount of bone marrow edema, compatible with acute to subacute injury. Minimal height loss. 2. Multilevel degenerative disc disease of the cervical spine with moderate C5-6 and mild C4-5 spinal canal stenosis. 3. Severe bilateral neural foraminal stenosis at C5-6.   DG: Left Hand   IMPRESSION: 1. Widening of the Scapholunate interval compatible with acute or chronic ligamentous injury which can predispose to SLAC wrist. 2. No acute fracture or dislocation identified about the left hand. 3. 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 4. Calcified peripheral vascular disease.  [name] was admitted to inpatient rehabilitation on 02/07/2022 and discharged home on 02/01/24/2022. She will be attending outpatient therapy.  She states she has pain in her neck radiating into her bilateral shoulders, left arm with tingling and lower back pain. She is walking with her walker.    She is walking 4,000 steps per day -she stopped the amitriptyline for migraines- no one told her to. This improved the dizziness by 50%. -she has had falls -does not not fele the room is spinning -she feels dizzy and unbalanced.  -she uses her cane -usually does not use the walker   Pain Inventory Average Pain 3 Pain Right Now 3 My pain is intermittent, dull, tingling, and aching  In the last 24  hours, has pain interfered with the following? General activity 0 Relation with others 1 Enjoyment of life 1 What TIME of day is your pain at its worst? varies Sleep (in general) Poor  Pain is worse with: some activites Pain improves with: medication Relief from Meds: 3    Family History  Problem Relation Age of Onset   Anesthesia problems Mother        hard  to wake post op    Cerebrovascular Disease Mother        Never had stroke, but had CEA.   Liver cancer Father        mets to liver    Bladder Cancer Father    Colon cancer Father        mets to colon    Schizophrenia Daughter        Schizoaffective disorder   Anesthesia problems Son        stopped breathing post op    Tremor Son    Anesthesia problems Granddaughter        PONV   High blood pressure Other    Diabetes Other    Arthritis Other    Schizophrenia Child    Tremor Child    Dementia Neg Hx    Esophageal cancer Neg Hx    Rectal cancer Neg Hx    Stomach cancer Neg Hx    Colon polyps Neg Hx    Social History   Socioeconomic History   Marital status: Legally Separated    Spouse name: Not on file   Number of children: 3   Years of education: 12   Highest education level: Not on file  Occupational History   Occupation: Retired     Comment: nanny   Occupation: retired  Tobacco Use   Smoking status: Never   Smokeless tobacco: Never  Scientific laboratory technician Use: Never used  Substance and Sexual Activity   Alcohol use: Never    Comment: quit 1978   Drug use: Never   Sexual activity: Not Currently  Other Topics Concern   Not on file  Social History Narrative    Lives at home. Her granddaughter lives with her and daughter    Right handed   Social Determinants of Health   Financial Resource Strain: Not on file  Food Insecurity: Not on file  Transportation Needs: Not on file  Physical Activity: Not on file  Stress: Not on file  Social Connections: Not on file   Past Surgical History:  Procedure Laterality Date   CATARACT EXTRACTION Right    CHOLECYSTECTOMY  1983   COLONOSCOPY  2008   [place] D & C  06/29/2011   Procedure: DILATATION AND CURETTAGE (D&C) /HYSTEROSCOPY;  Surgeon: Donnamae Jude, MD;  Location: [facility];  Service: Gynecology;  Laterality: N/A;   ROBOTIC ASSISTED TOTAL HYSTERECTOMY WITH  BILATERAL SALPINGO OOPHERECTOMY N/A 10/27/2020   Procedure: XI ROBOTIC ASSISTED TOTAL HYSTERECTOMY WITH BILATERAL SALPINGO OOPHORECTOMY;  Surgeon: Everitt Amber, MD;  Location: [facility];  Service: Gynecology;  Laterality: N/A;   SENTINEL NODE BIOPSY N/A 10/27/2020   Procedure: SENTINEL NODE BIOPSY;  Surgeon: Everitt Amber, MD;  Location: [facility];  Service: Gynecology;  Laterality: N/A;   Past Medical History:  Diagnosis Date   Allergy    Anemia    Arthritis    Asthma    Dianosed years ago-no meds at this time   Cancer [facility])  2021   endometrial    Cataract    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 name of MD    Dementia ([place])    Diabetes mellitus    type 2    Diverticulosis    Dyslipidemia    Dyspnea    with exertion    Dysrhythmia    "skips beats"   Endometrial cancer ([place]) 2021   Family history of adverse reaction to anesthesia    son stopped breathing during surgery , mother slow to wake up    Fibromyalgia 1980's   Generalized anxiety disorder    GERD (gastroesophageal reflux disease)    occ- will use Tums and Prolosec  prn    Heart murmur    Hyperlipemia    Hypertension    not on blood pressure meds due to allergies - last dose of meds- months ago    Hypothyroidism    Hypothyroidism (acquired)    Internal hemorrhoids    Irritable bowel syndrome 1980's   Liver cancer ([place])    Major depressive disorder    Migraine headaches 1980's   On meds-well controlled   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  Opioid Risk Score:   Fall Risk Score:  `1  Depression screen [facility] 2/9     03/05/2022    1:33 PM 09/21/2020    8:23 AM 09/01/2020    8:47 AM  Depression screen PHQ 2/9  Decreased Interest 1 1 0  Down, Depressed, Hopeless 0 1 0  PHQ - 2 Score 1 2 0  Altered sleeping 3 1 0  Tired, decreased energy 2 1 0  Change in appetite 0 0 0  Feeling bad or failure about yourself  0 0 0  Trouble concentrating 0 0 0  Moving slowly or fidgety/restless 0 0 0  Suicidal thoughts 0 0 0  PHQ-9 Score 6 4 0  Difficult doing work/chores Not difficult at all       Review of Systems  Constitutional:  Positive for chills.  HENT: Negative.    Eyes: Negative.   Respiratory: Negative.    Cardiovascular: Negative.   Gastrointestinal:  Positive for diarrhea.  Endocrine: Negative.   Genitourinary:  Positive for urgency.  Musculoskeletal:  Positive for back pain, gait problem and neck pain.  Skin: Negative.   Allergic/Immunologic: Negative.   Neurological:  Positive for dizziness, tremors, weakness and numbness.  Hematological: Negative.   Psychiatric/Behavioral:  The patient is nervous/anxious.       Objective:   Physical Exam Vitals and nursing note reviewed. BMI 33.11, weight 181 lbs Constitutional:      Appearance: Normal appearance.  Cardiovascular:     Rate and Rhythm: Normal rate and regular rhythm.     Pulses: Normal pulses.     Heart sounds:  Normal heart sounds.  Pulmonary:     Effort: Pulmonary effort is normal.     Breath sounds: Normal breath sounds.  Musculoskeletal:     Cervical back: Normal range of motion and neck supple.     Comments: Normal Muscle Bulk and Muscle Testing Reveals:  Upper Extremities: Full ROM and Muscle Strength 5/5 Lower Extremities: Full ROM and Muscle Strength 5/5 Arises from Table Slowly using walker for support Narrow Based  Gait     Skin:     General: Skin is warm and dry.  Neurological:     General: No focal deficit present.     Mental Status: She is alert and oriented to person, place, and time.  Psychiatric:        Mood and Affect: Mood normal.        Behavior: Behavior normal.        Assessment & Plan:   1. T7 Vertebral Fracture: F/U with Dr. Kathyrn Sheriff. Continue outpatient therapy.  2.  Essential Hypertension,: Continue current medication regimen. PCP Following.   3.  Neuropathic Pain: Continue current medication regimen. Continue to monitor  4. Dizziness. PCP following. Continue to monitor. Continue to Monitor. Commended on stopping amitriptyline and gabapentin. Encouraged continued hydration.   5. Diarrhea: discussed how this had complicated her PT  6. Migraines: discussed her prior use of amitriptyline and gabapentin and how she stopped these due to her dizziness. Continue magnesium supplement -discussed Botox, Nurtec  7. Constipation:  -continue magnesium supplement.  -Provided list of following foods that help with constipation and highlighted a few: 1) prunes- contain high amounts of fiber.  2) apples- has a form of dietary fiber called pectin that accelerates stool movement and increases beneficial gut bacteria 3) pears- in addition to fiber, also high in fructose and sorbitol which have laxative effect 4) figs- contain an enzyme ficin which helps to speed colonic transit 5) kiwis- contain an enzyme actinidin that improves gut motility and reduces constipation 6) oranges- rich in pectin (like apples) 7) grapefruits- contain a flavanol naringenin which has a laxative effect 8) vegetables- rich in fiber and also great sources of folate, vitamin C, and K 9) artichoke- high in inulin, prebiotic great for the microbiome 10) chicory- increases stool frequency and softness (can be added to coffee) 11) rhubarb- laxative effect 12) sweet potato- high fiber 13) beans, peas, and lentils- contain both soluble and  insoluble fiber 14) chia seeds- improves intestinal health and gut flora 15) flaxseeds- laxative effect 16) whole grain rye bread- high in fiber 17) oat bran- high in soluble and insoluble fiber 18) kefir- softens stools -recommended to try at least one of these foods every day.  -drink 6-8 glasses of water per day -walk regularly, especially after meals.   8) Insomnia: -Try to go outside near sunrise  9) HLD -continue Repatha

## 2022-07-24 NOTE — Progress Notes (Signed)
Subjective:     Patient ID: [name], female    DOB: 02/20/1951, 71 y.o.   MRN: [id number]  Chief Complaint  Patient presents with   Follow-up    6 week follow-up to discuss chronic issues Still not sleeping very well    HPI  Edema-was on bumex 64m-thought allergic, but not.  Back on it, but now out.  [facility]  Got rash from lasix and sugar increased and not work well.  Pt states I should have records.  Blurry vision-can't get appt till Dec w/ophth.  B12 deficiency-on injections-done 7/17.  Wants today.   Health Maintenance Due  Topic Date Due   OPHTHALMOLOGY EXAM  Never done   URINE MICROALBUMIN  Never done   Hepatitis C Screening  Never done   Pneumonia Vaccine 71 Years old (3 - PPSV23 or PCV20) 10/09/2019   Zoster Vaccines- Shingrix (2 of 2) 02/23/2021   HEMOGLOBIN A1C  06/27/2022    Past Medical History:  Diagnosis Date   Allergy    Anemia    Arthritis    Asthma    Dianosed years ago-no meds at this time   Cancer ([facility] 2021   endometrial    Cataract    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 name of MD    Dementia [place]    Diabetes mellitus    type 2    Diverticulosis    Dyslipidemia    Dyspnea    with exertion    Dysrhythmia    "skips beats"   Endometrial cancer ([place] 2021   Family history of adverse reaction to anesthesia    son stopped breathing during surgery , mother slow to wake up    Fibromyalgia 1980's   Generalized anxiety disorder    GERD (gastroesophageal reflux disease)    occ- will use Tums and Prolosec  prn    Heart murmur    Hyperlipemia    Hypertension    not on blood pressure meds due to allergies - last dose of meds- months ago    Hypothyroidism    Hypothyroidism (acquired)    Internal hemorrhoids    Irritable bowel syndrome 1980's   [place]    Major  depressive disorder    Migraine headaches 1980's   On meds-well controlled   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   Morbid obesity ([place]    Pernicious anemia    Pneumonia 2009   Several times over the past several years   PONV (postoperative nausea and vomiting)    was told by neurologist due to calcificaton in brain not to be put to sleep, N/V during Op and Recovery    Recurrent upper respiratory infection (URI)    Sleep apnea    no cpap   [place]   Vaginal polyp    benign per pt    Past Surgical History:  Procedure Laterality Date   CATARACT EXTRACTION Right    CHOLECYSTECTOMY  1983   COLONOSCOPY  2008   DB   DILATION AND CURETTAGE OF UTERUS  1972   HYSTEROSCOPY WITH D & C  06/29/2011   Procedure: DILATATION AND CURETTAGE (D&C) /HYSTEROSCOPY;  Surgeon: TDonnamae Jude MD;  [facility];  Service: Gynecology;  Laterality: N/A;   ROBOTIC ASSISTED TOTAL HYSTERECTOMY WITH BILATERAL SALPINGO  OOPHERECTOMY N/A 10/27/2020   Procedure: XI ROBOTIC ASSISTED TOTAL HYSTERECTOMY WITH BILATERAL SALPINGO OOPHORECTOMY;  Surgeon: Everitt Amber, MD;  [facility];  Service: Gynecology;  Laterality: N/A;   SENTINEL NODE BIOPSY N/A 10/27/2020   Procedure: SENTINEL NODE BIOPSY;  Surgeon: Everitt Amber, MD;  [facility];  Service: Gynecology;  Laterality: N/A;    Outpatient Medications Prior to Visit  Medication Sig Dispense Refill   acetaminophen (TYLENOL) 500 MG tablet Take 500 mg by mouth every 6 (six) hours as needed for moderate pain.     aspirin EC 325 MG tablet Take 1 tablet (325 mg total) by mouth daily. 90 tablet 3   Blood Glucose Monitoring Suppl (ACCU-CHEK GUIDE) w/Device KIT Use to test blood sugar 3 times daily. 1 kit 0   Cholecalciferol 25 MCG (1000 UT) capsule Take 1 capsule (1,000 Units total) by mouth daily. 90 capsule 3   cyanocobalamin (,VITAMIN B-12,) 1000 MCG/ML injection Inject 1,000 mcg into the muscle every 30 (thirty) days.       diphenoxylate-atropine (LOMOTIL) 2.5-0.025 MG tablet TAKE 1 TABLET BY MOUTH TWICE DAILY AS NEEDED FOR DIARRHEA OR  LOOSE  STOOLS 60 tablet 2   estradiol (ESTRACE) 0.1 MG/GM vaginal cream Place 0.5 g vaginally 2 (two) times a week. Place 0.5g nightly for two weeks then twice a week after 30 g 11   Evolocumab (REPATHA SURECLICK) 858 MG/ML SOAJ INJECT 1 PEN SUBCUTANEOUSLY EVERY 14 DAYS 6 mL 3   gabapentin (NEURONTIN) 100 MG capsule Take 1 capsule (100 mg total) by mouth at bedtime. 90 capsule 1   glucose blood (ACCU-CHEK GUIDE) test strip Use to test blood sugar 3 times daily 100 each 2   Lancets Misc. (ACCU-CHEK FASTCLIX LANCET) KIT Use to check blood sugar 3 times daily 1 kit 0   levothyroxine (SYNTHROID) 50 MCG tablet Take 1 tablet (50 mcg total) by mouth daily. 90 tablet 3   Multiple Vitamin (MULTIVITAMIN WITH MINERALS) TABS tablet Take 1 tablet by mouth daily. 30 tablet 0   ondansetron (ZOFRAN) 4 MG tablet Take 1 tablet (4 mg total) by mouth every 8 (eight) hours as needed for nausea or vomiting. 20 tablet 0   pantoprazole (PROTONIX) 40 MG tablet TAKE 1 TABLET BY MOUTH DAILY 90 tablet 3   polyethylene glycol (MIRALAX / GLYCOLAX) 17 g packet Take 17 g by mouth daily as needed for mild constipation. 14 each 0   Potassium 99 MG TABS Take 2 tablets (198 mg total) by mouth daily. 180 tablet 3   furosemide (LASIX) 40 MG tablet Take 1 tablet (40 mg total) by mouth daily. 90 tablet 1   citalopram (CELEXA) 10 MG tablet Take 10 mg by mouth every morning. (Patient not taking: Reported on 07/24/2022)     No facility-administered medications prior to visit.    Allergies  Allergen Reactions   Atenolol     Stomach problems, chills   Atorvastatin     Other reaction(s): retain fld   Canagliflozin     ALL DIABETIC MEDS PER PT   abdominal pain, headache   Losartan Potassium     chills, HA, stomach pain   Acarbose Nausea Only     headache,nausea   Cinnamon     Stomach pain   Empagliflozin       headaches   Glimepiride     ALL DIABETIC MEDS PER PT  abdominal pain, headache   Metformin And Related Nausea And Vomiting and Other (See Comments)    Patient states  that she has severe chills, headache and cramping additionally. Says blood sugar is uncontrolled because of this   Other Other (See Comments)    ALL DIABETIC MEDS PER PT    Pioglitazone Other (See Comments)    ALL DIABETIC MEDS PER PT  Other reaction(s): peripheral edema   Pravastatin     Other reaction(s): retain fld   Sitagliptin     ALL DIABETIC MEDS PER PT  Other reaction(s): headache   Spironolactone-Hctz Other (See Comments)   Statins     Myalgia   Sulfamethoxazole Other (See Comments)    ALL DIABETIC MEDS PER PT    Tramadol Hcl Itching   Acarbose Other (See Comments)   Atenolol Other (See Comments)   Atorvastatin Other (See Comments)   Atorvastatin Calcium     Other reaction(s): retain fld   Canagliflozin Other (See Comments)   Crestor [Rosuvastatin] Other (See Comments)   Empagliflozin Other (See Comments)   Glimepiride Other (See Comments)   Losartan Potassium Other (See Comments)   Metformin Other (See Comments)   Other Other (See Comments)   Pioglitazone Other (See Comments)   Pravastatin Other (See Comments)   Rosuvastatin Calcium    Spironolactone-Hctz Other (See Comments)   Tramadol    Lasix [Furosemide] Rash    Sugars increased   Sulfa Antibiotics Rash and Other (See Comments)    Mother has told patient in the past not to take, she cannot recall there reaction   ROS neg/noncontributory except as noted HPI/below Diarrhea-seeing GI.  Some better on meds.   No CP      Objective:     BP (!) 140/80   Pulse 87   Temp 97.8 F (36.6 C) (Temporal)   Ht '5\' 2"'  (1.575 m)   Wt 180 lb 6 oz (81.8 kg)   SpO2 99%   BMI 32.99 kg/m  Wt Readings from Last 3 Encounters:  07/24/22 180 lb 6 oz (81.8 kg)  06/22/22 180 lb 6.4 oz (81.8 kg)  06/04/22 184 lb 8 oz (83.7 kg)    Physical Exam   Gen:  WDWN NAD HEENT: NCAT, conjunctiva not injected, sclera nonicteric NECK:  supple, no thyromegaly, no nodes, no carotid bruits CARDIAC: RRR, S1S2+, 1/6 murmur. DP 1+B LUNGS: CTAB. No wheezes ABDOMEN:  BS+, soft, NTND, No HSM, no masses EXT: no edema MSK: no gross abnormalities.  NEURO: A&O x3.  CN II-XII intact.  PSYCH: normal mood. Good eye contact     Assessment & Plan:   Problem List Items Addressed This Visit       Other   B12 deficiency   Relevant Medications   cyanocobalamin (VITAMIN B12) injection 1,000 mcg (Start on 07/24/2022 11:00 AM)   Local edema - Primary   Other Visit Diagnoses     Blurry vision, bilateral          Local edema-chronic   controlled on bumex 52m.  Renewed.  [facility], they told her they sent them.  If we can't get, will need to repeat labs in 1 mo d/t meds.   Blurry vision-pt can't get appt w/oph till Dec-calling for cancelations.  May need to go elsewhere. Or wait. B12 deficiency-chronic.  Taking B12 injections.  Last 3 wks ago, but here today, so given.   F/u 1 mo.   Meds ordered this encounter  Medications   cyanocobalamin (VITAMIN B12) injection 1,000 mcg    AWellington Hampshire MD

## 2022-07-30 ENCOUNTER — Ambulatory Visit
Admission: RE | Admit: 2022-07-30 | Discharge: 2022-07-30 | Disposition: A | Discharge status: Home / Self Care | Payer: Medicare Other | Source: Ambulatory Visit | Attending: Family Medicine | Admitting: Family Medicine

## 2022-07-30 DIAGNOSIS — Z1231 Encounter for screening mammogram for malignant neoplasm of breast: Secondary | ICD-10-CM

## 2022-07-30 DIAGNOSIS — Z13820 Encounter for screening for osteoporosis: Secondary | ICD-10-CM

## 2022-07-30 NOTE — Progress Notes (Unsigned)
HISTORY AND PHYSICAL     CC:  follow up. Requesting Provider:  [name], MD  HPI: This is a 71 y.o. female here for follow up for carotid artery stenosis.  She was seen in consultation in the hospital in November 2021.  She had undergone a GYN procedure and a few days later was noted to have numbness and tingling and swelling on the left side of her face and eye.  She had a full evaluation including an MRI of the brain which showed no stroke CT scan of the head which showed no bleed carotid duplex scan and CT angio of the neck.    She had not had any episodes of weakness in the upper or lower extremities.  She had not had any episodes of amaurosis.  She had no prior similar events.  She stated that she did have a lot of stress in her life at the time.  She has been evaluated in the past for arrhythmia by cardiology.  She had never had a myocardial infarction.  She had never had a prior stroke.  She had been on aspirin for several years. Other medical problems include diabetes, migraines, hypertension diabetes  Pt was last seen 06/01/2021 and at that time pt was not having any stroke sx.  She was having some numbness in both feet and hands that would come and go.  She had not had any facial droop but did have some after surgery in November 2021.   Pt returns today for follow up.    Pt *** any amaurosis fugax, speech difficulties, weakness, numbness, paralysis or clumsiness or facial droop.    ***  The pt is not on a statin for cholesterol management. She is on Repatha The pt is on a daily aspirin.   Other AC:  none The pt is on diuretic for hypertension.   The pt does  have diabetes Tobacco hx:  never  Pt does *** have family hx of AAA.  Past Medical History:  Diagnosis Date   Allergy    Anemia    Arthritis    Asthma    Dianosed years ago-no meds at this time   Cancer [facility]) 2021   endometrial    Cataract    Right eye removed-still has left cataract    Chronic kidney  disease 1997   Nepthrotic syndrom with minimal change-in remission per pt - last seen by kidney MD- 2 mos ago ? name of MD    Dementia ([place])    Diabetes mellitus    type 2    Diverticulosis    Dyslipidemia    Dyspnea    with exertion    Dysrhythmia    "skips beats"   Endometrial cancer ([place]) 2021   Family history of adverse reaction to anesthesia    son stopped breathing during surgery , mother slow to wake up    Fibromyalgia 1980's   Generalized anxiety disorder    GERD (gastroesophageal reflux disease)    occ- will use Tums and Prolosec  prn    Heart murmur    Hyperlipemia    Hypertension    not on blood pressure meds due to allergies - last dose of meds- months ago    Hypothyroidism    Hypothyroidism (acquired)    Internal hemorrhoids    Irritable bowel syndrome 1980's   Liver cancer ([place])    Major depressive disorder    Migraine headaches 1980's   On meds-well controlled   Mild  neurocognitive disorder 10/05/2019   Morbid obesity ([place])    Pernicious anemia    Pneumonia 2009   Several times over the past several years   PONV (postoperative nausea and vomiting)    was told by neurologist due to calcificaton in brain not to be put to sleep, N/V during Op and Recovery    Recurrent upper respiratory infection (URI)    Sleep apnea    no cpap   Subarachnoid hemorrhage ([place]) 2010   Vaginal polyp    benign per pt    Past Surgical History:  Procedure Laterality Date   CATARACT EXTRACTION Right    CHOLECYSTECTOMY  1983   COLONOSCOPY  2008   DB   DILATION AND CURETTAGE OF UTERUS  1972   HYSTEROSCOPY WITH D & C  06/29/2011   Procedure: DILATATION AND CURETTAGE (D&C) /HYSTEROSCOPY;  Surgeon: Donnamae Jude, MD;  Location: [facility];  Service: Gynecology;  Laterality: N/A;   ROBOTIC ASSISTED TOTAL HYSTERECTOMY WITH BILATERAL SALPINGO OOPHERECTOMY N/A 10/27/2020   Procedure: XI ROBOTIC ASSISTED TOTAL HYSTERECTOMY WITH BILATERAL SALPINGO OOPHORECTOMY;  Surgeon: Everitt Amber, MD;   Location: [facility];  Service: Gynecology;  Laterality: N/A;   SENTINEL NODE BIOPSY N/A 10/27/2020   Procedure: SENTINEL NODE BIOPSY;  Surgeon: Everitt Amber, MD;  Location: [facility];  Service: Gynecology;  Laterality: N/A;    Allergies  Allergen Reactions   Atenolol     Stomach problems, chills   Atorvastatin     Other reaction(s): retain fld   Canagliflozin     ALL DIABETIC MEDS PER PT   abdominal pain, headache   Losartan Potassium     chills, HA, stomach pain   Acarbose Nausea Only     headache,nausea   Cinnamon     Stomach pain   Empagliflozin      headaches   Glimepiride     ALL DIABETIC MEDS PER PT  abdominal pain, headache   Metformin And Related Nausea And Vomiting and Other (See Comments)    Patient states that she has severe chills, headache and cramping additionally. Says blood sugar is uncontrolled because of this   Other Other (See Comments)    ALL DIABETIC MEDS PER PT    Pioglitazone Other (See Comments)    ALL DIABETIC MEDS PER PT  Other reaction(s): peripheral edema   Pravastatin     Other reaction(s): retain fld   Sitagliptin     ALL DIABETIC MEDS PER PT  Other reaction(s): headache   Spironolactone-Hctz Other (See Comments)   Statins     Myalgia   Sulfamethoxazole Other (See Comments)    ALL DIABETIC MEDS PER PT    Tramadol Hcl Itching   Acarbose Other (See Comments)   Atenolol Other (See Comments)   Atorvastatin Other (See Comments)   Atorvastatin Calcium     Other reaction(s): retain fld   Canagliflozin Other (See Comments)   Crestor [Rosuvastatin] Other (See Comments)   Empagliflozin Other (See Comments)   Glimepiride Other (See Comments)   Losartan Potassium Other (See Comments)   Metformin Other (See Comments)   Other Other (See Comments)   Pioglitazone Other (See Comments)   Pravastatin Other (See Comments)   Rosuvastatin Calcium    Spironolactone-Hctz Other (See Comments)   Tramadol    Lasix [Furosemide] Rash    Sugars increased   Sulfa  Antibiotics Rash and Other (See Comments)    Mother has told patient in the past not to take, she cannot recall there reaction  Current Outpatient Medications  Medication Sig Dispense Refill   acetaminophen (TYLENOL) 500 MG tablet Take 500 mg by mouth every 6 (six) hours as needed for moderate pain.     aspirin EC 325 MG tablet Take 1 tablet (325 mg total) by mouth daily. 90 tablet 3   Blood Glucose Monitoring Suppl (ACCU-CHEK GUIDE) w/Device KIT Use to test blood sugar 3 times daily. 1 kit 0   bumetanide (BUMEX) 1 MG tablet Take 1 tablet (1 mg total) by mouth every morning. 90 tablet 1   bumetanide (BUMEX) 1 MG tablet Take 1 tablet (1 mg total) by mouth daily. 90 tablet 0   Cholecalciferol 25 MCG (1000 UT) capsule Take 1 capsule (1,000 Units total) by mouth daily. 90 capsule 3   cyanocobalamin (,VITAMIN B-12,) 1000 MCG/ML injection Inject 1,000 mcg into the muscle every 30 (thirty) days.      diphenoxylate-atropine (LOMOTIL) 2.5-0.025 MG tablet TAKE 1 TABLET BY MOUTH TWICE DAILY AS NEEDED FOR DIARRHEA OR  LOOSE  STOOLS 60 tablet 2   estradiol (ESTRACE) 0.1 MG/GM vaginal cream Place 0.5 g vaginally 2 (two) times a week. Place 0.5g nightly for two weeks then twice a week after 30 g 11   Evolocumab (REPATHA SURECLICK) 654 MG/ML SOAJ INJECT 1 PEN SUBCUTANEOUSLY EVERY 14 DAYS 6 mL 3   gabapentin (NEURONTIN) 100 MG capsule Take 1 capsule (100 mg total) by mouth at bedtime. 90 capsule 1   glucose blood (ACCU-CHEK GUIDE) test strip Use to test blood sugar 3 times daily 100 each 2   Lancets Misc. (ACCU-CHEK FASTCLIX LANCET) KIT Use to check blood sugar 3 times daily (Patient not taking: Reported on 07/24/2022) 1 kit 0   levothyroxine (SYNTHROID) 50 MCG tablet Take 1 tablet (50 mcg total) by mouth daily. 90 tablet 3   meclizine (ANTIVERT) 12.5 MG tablet Take 1 tablet (12.5 mg total) by mouth 3 (three) times daily as needed for dizziness. 30 tablet 0   Multiple Vitamin (MULTIVITAMIN WITH MINERALS) TABS  tablet Take 1 tablet by mouth daily. 30 tablet 0   ondansetron (ZOFRAN) 4 MG tablet Take 1 tablet (4 mg total) by mouth every 8 (eight) hours as needed for nausea or vomiting. 20 tablet 0   pantoprazole (PROTONIX) 40 MG tablet TAKE 1 TABLET BY MOUTH DAILY 90 tablet 3   polyethylene glycol (MIRALAX / GLYCOLAX) 17 g packet Take 17 g by mouth daily as needed for mild constipation. 14 each 0   Potassium 99 MG TABS Take 2 tablets (198 mg total) by mouth daily. 180 tablet 3   No current facility-administered medications for this visit.    Family History  Problem Relation Age of Onset   Anesthesia problems Mother        hard to wake post op    Cerebrovascular Disease Mother        Never had stroke, but had CEA.   Liver cancer Father        mets to liver    Bladder Cancer Father    Colon cancer Father        mets to colon    Schizophrenia Daughter        Schizoaffective disorder   Anesthesia problems Son        stopped breathing post op    Tremor Son    Anesthesia problems Granddaughter        PONV   High blood pressure Other    Diabetes Other    Arthritis Other  Schizophrenia Child    Tremor Child    Dementia Neg Hx    Esophageal cancer Neg Hx    Rectal cancer Neg Hx    Stomach cancer Neg Hx    Colon polyps Neg Hx     Social History   Socioeconomic History   Marital status: Legally Separated    Spouse name: Not on file   Number of children: 3   Years of education: 12   Highest education level: Not on file  Occupational History   Occupation: Retired     Comment: nanny   Occupation: retired  Tobacco Use   Smoking status: Never   Smokeless tobacco: Never  Scientific laboratory technician Use: Never used  Substance and Sexual Activity   Alcohol use: Never    Comment: quit 1978   Drug use: Never   Sexual activity: Not Currently  Other Topics Concern   Not on file  Social History Narrative    Lives at home. Her granddaughter lives with her and daughter    Right handed    Social Determinants of Health   Financial Resource Strain: Not on file  Food Insecurity: Not on file  Transportation Needs: Not on file  Physical Activity: Not on file  Stress: Not on file  Social Connections: Not on file  Intimate Partner Violence: Not on file     REVIEW OF SYSTEMS:  *** '[X]'  denotes positive finding, '[ ]'  denotes negative finding Cardiac  Comments:  Chest pain or chest pressure:    Shortness of breath upon exertion:    Short of breath when lying flat:    Irregular heart rhythm:        Vascular    Pain in calf, thigh, or hip brought on by ambulation:    Pain in feet at night that wakes you up from your sleep:     Blood clot in your veins:    Leg swelling:         Pulmonary    Oxygen at home:    Productive cough:     Wheezing:         Neurologic    Sudden weakness in arms or legs:     Sudden numbness in arms or legs:     Sudden onset of difficulty speaking or slurred speech:    Temporary loss of vision in one eye:     Problems with dizziness:         Gastrointestinal    Blood in stool:     Vomited blood:         Genitourinary    Burning when urinating:     Blood in urine:        Psychiatric    Major depression:         Hematologic    Bleeding problems:    Problems with blood clotting too easily:        Skin    Rashes or ulcers:        Constitutional    Fever or chills:      PHYSICAL EXAMINATION:  ***  General:  WDWN in NAD; vital signs documented above Gait: Not observed HENT: WNL, normocephalic Pulmonary: normal non-labored breathing Cardiac: {Desc; regular/irreg:14544} HR, {With/Without:20273} carotid bruit*** Abdomen: soft, NT; aortic pulse is *** palpable Skin: {With/Without:20273} rashes Vascular Exam/Pulses:  Right Left  Radial {Exam; arterial pulse strength 0-4:30167} {Exam; arterial pulse strength 0-4:30167}  Popliteal {Exam; arterial pulse strength 0-4:30167} {Exam; arterial pulse strength 0-4:30167}  DP {  Exam;  arterial pulse strength 0-4:30167} {Exam; arterial pulse strength 0-4:30167}  PT {Exam; arterial pulse strength 0-4:30167} {Exam; arterial pulse strength 0-4:30167}   Extremities: {With/Without:20273} ischemic changes, {With/Without:20273} Gangrene , {With/Without:20273} cellulitis; {With/Without:20273} open wounds Musculoskeletal: no muscle wasting or atrophy  Neurologic: A&O X 3; moving all extremities equally; speech is fluent/normal Psychiatric:  The pt has {Desc; normal/abnormal:11317::"Normal"} affect.   Non-Invasive Vascular Imaging:   Carotid Duplex on 8/15/202 Right:  ***% ICA stenosis Left:  ***% ICA stenosis ***  Previous Carotid duplex on 06/01/2021: Right: 40-59% ICA stenosis Left:   1-39% ICA stenosis    ASSESSMENT/PLAN:: 71 y.o. female here for follow up carotid artery stenosis   -duplex today reveals *** -discussed s/s of stroke with pt and she understands should she develop any of these sx, she will go to the nearest ER or call 911. -pt will f/u in *** with carotid duplex -pt will call sooner should they have any issues. -continue statin/asa ***  Leontine Locket, [facility] [phone]  Clinic MD:  Carlis Abbott

## 2022-07-31 ENCOUNTER — Ambulatory Visit (HOSPITAL_COMMUNITY)
Admission: RE | Admit: 2022-07-31 | Discharge: 2022-07-31 | Disposition: A | Discharge status: Home / Self Care | Payer: Medicare Other | Source: Ambulatory Visit | Attending: Vascular Surgery | Admitting: Vascular Surgery

## 2022-07-31 ENCOUNTER — Ambulatory Visit (INDEPENDENT_AMBULATORY_CARE_PROVIDER_SITE_OTHER): Payer: Medicare Other | Admitting: Physician Assistant

## 2022-07-31 VITALS — BP 137/72 | HR 83 | Temp 97.8°F | Resp 18 | Ht 62.0 in | Wt 183.7 lb

## 2022-07-31 DIAGNOSIS — I6523 Occlusion and stenosis of bilateral carotid arteries: Secondary | ICD-10-CM | POA: Diagnosis present

## 2022-07-31 NOTE — Progress Notes (Signed)
Established Carotid Patient   History of Present Illness   [name] is a 71 y.o. ([birth year]) female who is here for follow-up for carotid artery stenosis.  The first consulted on the patient in November 2021 for an episode of numbness and tingling on the left side of her face, after a GYN procedure.  MRI of the brain showed no stroke.  CT scan of the head showed no bleeding.  Carotid duplex scan at that time revealed 40 to 59% ICA stenosis on the right, and 1 to 39% ICA stenosis on the left.  She has no history of weakness or numbness in upper or lower extremities. She has no history of amaurosis. She has no history of TIA, stroke, myocardial infarction.  At her most recent visit on 06/02/2021 the patient had unchanged bilateral carotid artery stenosis.  It was 40 to 59% ICA stenosis on the right and 1 to 39% ICA stenosis on the left.  The patient is here for follow-up today and states she is still asymptomatic.  She denies any weakness or numbness in her extremities, amaurosis fugax, facial droop, or slurred speech.  She did take a fall in February 2023 after tripping on something in her home. She states that she fractured some vertebrae because of it. She also endorses a progressive loss of fine motor skills and short term memory over the years, and she is not sure why. 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 Her current neurologist has considered the diagnosis but is still unsure.   Current Outpatient Medications  Medication Sig Dispense Refill   acetaminophen (TYLENOL) 500 MG tablet Take 500 mg by mouth every 6 (six) hours as needed for moderate pain.     aspirin EC 325 MG tablet Take 1 tablet (325 mg total) by mouth daily. 90 tablet 3   Blood Glucose Monitoring Suppl (ACCU-CHEK GUIDE) w/Device KIT Use to test blood sugar 3 times daily. 1 kit 0   bumetanide (BUMEX) 1 MG tablet Take 1 tablet (1 mg total) by mouth every  morning. 90 tablet 1   bumetanide (BUMEX) 1 MG tablet Take 1 tablet (1 mg total) by mouth daily. 90 tablet 0   Cholecalciferol 25 MCG (1000 UT) capsule Take 1 capsule (1,000 Units total) by mouth daily. 90 capsule 3   cyanocobalamin (,VITAMIN B-12,) 1000 MCG/ML injection Inject 1,000 mcg into the muscle every 30 (thirty) days.      diphenoxylate-atropine (LOMOTIL) 2.5-0.025 MG tablet TAKE 1 TABLET BY MOUTH TWICE DAILY AS NEEDED FOR DIARRHEA OR  LOOSE  STOOLS 60 tablet 2   estradiol (ESTRACE) 0.1 MG/GM vaginal cream Place 0.5 g vaginally 2 (two) times a week. Place 0.5g nightly for two weeks then twice a week after 30 g 11   Evolocumab (REPATHA SURECLICK) 159 MG/ML SOAJ INJECT 1 PEN SUBCUTANEOUSLY EVERY 14 DAYS 6 mL 3   gabapentin (NEURONTIN) 100 MG capsule Take 1 capsule (100 mg total) by mouth at bedtime. 90 capsule 1   glucose blood (ACCU-CHEK GUIDE) test strip Use to test blood sugar 3 times daily 100 each 2   Lancets Misc. (ACCU-CHEK FASTCLIX LANCET) KIT Use to check blood sugar 3 times daily (Patient not taking: Reported on 07/24/2022) 1 kit 0   levothyroxine (SYNTHROID) 50 MCG tablet Take 1 tablet (50 mcg total) by mouth daily. 90 tablet 3   meclizine (ANTIVERT) 12.5 MG tablet Take 1 tablet (12.5 mg total) by mouth  3 (three) times daily as needed for dizziness. 30 tablet 0   Multiple Vitamin (MULTIVITAMIN WITH MINERALS) TABS tablet Take 1 tablet by mouth daily. 30 tablet 0   ondansetron (ZOFRAN) 4 MG tablet Take 1 tablet (4 mg total) by mouth every 8 (eight) hours as needed for nausea or vomiting. 20 tablet 0   pantoprazole (PROTONIX) 40 MG tablet TAKE 1 TABLET BY MOUTH DAILY 90 tablet 3   polyethylene glycol (MIRALAX / GLYCOLAX) 17 g packet Take 17 g by mouth daily as needed for mild constipation. 14 each 0   Potassium 99 MG TABS Take 2 tablets (198 mg total) by mouth daily. 180 tablet 3   No current facility-administered medications for this visit.    REVIEW OF SYSTEMS (negative unless  checked):   Cardiac:  _0  Chest pain or chest pressure? _1  Shortness of breath upon activity? _2  Shortness of breath when lying flat? _3  Irregular heart rhythm?  Vascular:  _4  Pain in calf, thigh, or hip brought on by walking? _5  Pain in feet at night that wakes you up from your sleep? _6  Blood clot in your veins? _7  Leg swelling?  Pulmonary:  _8  Oxygen at home? _9  Productive cough? _10  Wheezing?  Neurologic:  _11  Sudden weakness in arms or legs? _12  Sudden numbness in arms or legs? _13  Sudden onset of difficult speaking or slurred speech? _14  Temporary loss of vision in one eye? _15  Problems with dizziness?  Gastrointestinal:  _16  Blood in stool? _17  Vomited blood?  Genitourinary:  _18  Burning when urinating? _19  Blood in urine?  Psychiatric:  _20  Major depression  Hematologic:  _21  Bleeding problems? _22  Problems with blood clotting?  Dermatologic:  _23  Rashes or ulcers?  Constitutional:  _24  Fever or chills?  Ear/Nose/Throat:  _25  Change in hearing? _26  Nose bleeds? _27  Sore throat?  Musculoskeletal:  _28  Back pain? _29  Joint pain? _30  Muscle pain?   Physical Examination   Vitals:   07/31/22 1429 07/31/22 1433  BP: (!) 145/77 137/72  Pulse: 83   Resp: 18   Temp: 97.8 F (36.6 C)   TempSrc: Temporal   SpO2: 99%   Weight: 183 lb 11.2 oz (83.3 kg)   Height: _31  (1.575 m)    Body mass index is 33.6 kg/m.  General:  WDWN in NAD; vital signs documented above Gait: Not observed HENT: WNL, normocephalic Pulmonary: normal non-labored breathing , without Rales, rhonchi,  wheezing Cardiac: regular HR, without murmurs without carotid bruit Abdomen: soft, NT, no masses Skin: without rashes Vascular Exam/Pulses: 2+ DP pulses bilaterally.  2+ radial pulses bilaterally. Extremities: without ischemic changes, without Gangrene , without cellulitis; without open wounds;  Musculoskeletal: no muscle wasting or atrophy  Neurologic: A&O X 3;  No focal weakness or  paresthesias are detected Psychiatric:  The pt has Normal affect.  Non-Invasive Vascular Imaging   B Carotid Duplex (07/31/2022):  R ICA stenosis:  40-59% R VA:  patent and antegrade L ICA stenosis:  1-39% L VA:  patent and antegrade   Medical Decision Making   [name] is a 71 y.o. female who presents with: asymptomatic bilateral carotid artery stenosis  - Duplex today reveals unchanged RICA stenosis at 19-14% and unchanged LICA stenosis at 7-82%. The patient is still asymptomatic. - I have discussed the signs and symptoms of stroke, and she understands to go to the ER or call 911 if she experiences any of these symptoms - Continue Repatha and Aspirin - She will follow up in 1 year  with bilateral carotid artery duplex  Vicente Serene PA-C [facility] [place]: [facility] MD: [facility]

## 2022-08-09 ENCOUNTER — Ambulatory Visit (INDEPENDENT_AMBULATORY_CARE_PROVIDER_SITE_OTHER): Payer: Medicare Other | Admitting: Gastroenterology

## 2022-08-09 ENCOUNTER — Encounter: Payer: Self-pay | Admitting: Gastroenterology

## 2022-08-09 VITALS — BP 132/72 | HR 72 | Ht 62.0 in | Wt 180.1 lb

## 2022-08-09 DIAGNOSIS — R140 Abdominal distension (gaseous): Secondary | ICD-10-CM

## 2022-08-09 DIAGNOSIS — K219 Gastro-esophageal reflux disease without esophagitis: Secondary | ICD-10-CM | POA: Diagnosis not present

## 2022-08-09 DIAGNOSIS — K581 Irritable bowel syndrome with constipation: Secondary | ICD-10-CM | POA: Diagnosis not present

## 2022-08-09 MED ORDER — PANTOPRAZOLE SODIUM 20 MG PO TBEC: 20.0000 mg | DELAYED_RELEASE_TABLET | Freq: Every day | ORAL | 3 refills | Status: DC

## 2022-08-09 NOTE — Patient Instructions (Addendum)
If you are age 73 or older, your body mass index should be between 23-30. Your Body mass index is 32.95 kg/m. If this is out of the aforementioned range listed, please consider follow up with your Primary Care Provider. ________________________________________________________  The [facility] providers would like to encourage you to use [facility] to communicate with providers for non-urgent requests or questions.  Due to long hold times on the telephone, sending your provider a message by [facility] may be a faster and more efficient way to get a response.  Please allow 48 business hours for a response.  Please remember that this is for non-urgent requests.  _______________________________________________________  Decrease your Pantoprazole to 20 mg 1 tablet daily.  *We have sent a new prescription to your pharmacy.  Use Lactaid milk instead of regular milk  Follow anti-reflux measures  Continue eating prunes or drinking prune juice.   Call the office to schedule a 3 month follow up with Vicie Mutters, PA-C

## 2022-08-09 NOTE — Progress Notes (Signed)
[name]    [id number]    [birth year]  Primary Care Physician:Kulik, Catalina Lunger, MD  Referring Physician: [name], MD [place],  [place] [address]   Chief complaint:  Chronic diarrhea  HPI:  71 year old very pleasant female here for follow-up visit for IBS diarrhea and generalized abdominal pain with cramping.  Prune juice  and eating everyday, BM once every 1-3 days   She is having worsening diarrhea with 5-6 bowel movements a day.  She was taking MiraLAX but stopped it as it was making her diarrhea worse.  She is continuing to have increased stool frequency She was constipated previously after surgery. She did well during immediate postop but in the last few weeks her symptoms have been progressively worse   She is s/p hysterectomy, BSO and lymph node sampling on October 27, 2020 for stage I endometrial cancer   She was recently in the ER on January 20 CT Angio chest, abd and pelvis  Mild prominent air and fluid filled loops of ileum, possible enteritis. Moderate amount of colonic stool see through out colon with diverticulosis     Denies any rectal bleeding.  No unintentional weight loss, does have some decreased appetite.  She has gained 15 pounds in the past 2 to 3 months     Colonoscopy June 2019: 2 small sessile polyps removed, 1 of which was tubular adenoma.  Colonic diverticulosis and internal hemorrhoids.   Outpatient Encounter Medications as of 08/09/2022  Medication Sig   acetaminophen (TYLENOL) 500 MG tablet Take 500 mg by mouth every 6 (six) hours as needed for moderate pain.   aspirin EC 325 MG tablet Take 1 tablet (325 mg total) by mouth daily.   Blood Glucose Monitoring Suppl (ACCU-CHEK GUIDE) w/Device KIT Use to test blood sugar 3 times daily.   bumetanide (BUMEX) 1 MG tablet Take 1 tablet (1 mg total) by mouth every morning.   bumetanide (BUMEX) 1 MG tablet Take 1 tablet (1 mg total) by mouth daily.    Cholecalciferol 25 MCG (1000 UT) capsule Take 1 capsule (1,000 Units total) by mouth daily.   cyanocobalamin (,VITAMIN B-12,) 1000 MCG/ML injection Inject 1,000 mcg into the muscle every 30 (thirty) days.    diphenoxylate-atropine (LOMOTIL) 2.5-0.025 MG tablet TAKE 1 TABLET BY MOUTH TWICE DAILY AS NEEDED FOR DIARRHEA OR  LOOSE  STOOLS   estradiol (ESTRACE) 0.1 MG/GM vaginal cream Place 0.5 g vaginally 2 (two) times a week. Place 0.5g nightly for two weeks then twice a week after   Evolocumab (REPATHA SURECLICK) 419 MG/ML SOAJ INJECT 1 PEN SUBCUTANEOUSLY EVERY 14 DAYS   gabapentin (NEURONTIN) 100 MG capsule Take 1 capsule (100 mg total) by mouth at bedtime.   glucose blood (ACCU-CHEK GUIDE) test strip Use to test blood sugar 3 times daily   Lancets Misc. (ACCU-CHEK FASTCLIX LANCET) KIT Use to check blood sugar 3 times daily   levothyroxine (SYNTHROID) 50 MCG tablet Take 1 tablet (50 mcg total) by mouth daily.   meclizine (ANTIVERT) 12.5 MG tablet Take 1 tablet (12.5 mg total) by mouth 3 (three) times daily as needed for dizziness.   Multiple Vitamin (MULTIVITAMIN WITH MINERALS) TABS tablet Take 1 tablet by mouth daily.   ondansetron (ZOFRAN) 4 MG tablet Take 1 tablet (4 mg total) by mouth every 8 (eight) hours as needed for nausea or vomiting.   pantoprazole (PROTONIX) 40 MG tablet TAKE 1 TABLET BY MOUTH DAILY  polyethylene glycol (MIRALAX / GLYCOLAX) 17 g packet Take 17 g by mouth daily as needed for mild constipation.   Potassium 99 MG TABS Take 2 tablets (198 mg total) by mouth daily.   No facility-administered encounter medications on file as of 08/09/2022.    Allergies as of 08/09/2022 - Review Complete 08/09/2022  Allergen Reaction Noted   Atenolol  09/29/2019   Atorvastatin  05/20/2018   Canagliflozin  05/20/2018   Losartan potassium  05/20/2018   Acarbose Nausea Only 05/20/2018   Cinnamon  06/15/2015   Empagliflozin  11/28/2021   Glimepiride  05/20/2018   Metformin and related  Nausea And Vomiting and Other (See Comments) 06/26/2011   Other Other (See Comments) 04/15/2020   Pioglitazone Other (See Comments) 04/15/2020   Pravastatin  05/20/2018   Sitagliptin  05/20/2018   Spironolactone-hctz Other (See Comments) 11/28/2021   Statins  10/14/2020   Sulfamethoxazole Other (See Comments) 03/24/2007   Tramadol hcl Itching 10/29/2020   Acarbose Other (See Comments) 11/28/2021   Atenolol Other (See Comments) 11/28/2021   Atorvastatin Other (See Comments) 11/28/2021   Atorvastatin calcium  11/28/2021   Canagliflozin Other (See Comments) 11/28/2021   Crestor [rosuvastatin] Other (See Comments) 11/28/2021   Empagliflozin Other (See Comments) 11/28/2021   Glimepiride Other (See Comments) 11/28/2021   Losartan potassium Other (See Comments) 11/28/2021   Metformin Other (See Comments) 11/28/2021   Other Other (See Comments) 11/28/2021   Pioglitazone Other (See Comments) 11/28/2021   Pravastatin Other (See Comments) 11/28/2021   Rosuvastatin calcium  03/14/2022   Spironolactone-hctz Other (See Comments) 11/28/2021   Tramadol  03/14/2022   Lasix [furosemide] Rash 07/24/2022   Sulfa antibiotics Rash and Other (See Comments) 06/16/2011    Past Medical History:  Diagnosis Date   Allergy    Anemia    Arthritis    Asthma    Dianosed years ago-no meds at this time   Cancer [facility]) 2021   endometrial    Cataract    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 name of MD    Dementia ([place])    Diabetes mellitus    type 2    Diverticulosis    Dyslipidemia    Dyspnea    with exertion    Dysrhythmia    "skips beats"   Endometrial cancer ([place]) 2021   Family history of adverse reaction to anesthesia    son stopped breathing during surgery , mother slow to wake up    Fibromyalgia 1980's   Generalized anxiety disorder    GERD (gastroesophageal  reflux disease)    occ- will use Tums and Prolosec  prn    Heart murmur    Hyperlipemia    Hypertension    not on blood pressure meds due to allergies - last dose of meds- months ago    Hypothyroidism    Hypothyroidism (acquired)    Internal hemorrhoids    Irritable bowel syndrome 1980's   Liver cancer ([place])    Major depressive disorder    Migraine headaches 1980's   On meds-well controlled   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   Morbid obesity ([place])    Pernicious anemia    Pneumonia 2009   Several times over the past several years   PONV (postoperative nausea and vomiting)    was told by neurologist due to calcificaton in brain not to  be put to sleep, N/V during Op and Recovery    Recurrent upper respiratory infection (URI)    Sleep apnea    no cpap   Subarachnoid hemorrhage ([place]) 2010   Vaginal polyp    benign per pt    Past Surgical History:  Procedure Laterality Date   CATARACT EXTRACTION Right    CHOLECYSTECTOMY  1983   COLONOSCOPY  2008   DB   DILATION AND CURETTAGE OF UTERUS  1972   HYSTEROSCOPY WITH D & C  06/29/2011   Procedure: DILATATION AND CURETTAGE (D&C) /HYSTEROSCOPY;  Surgeon: Donnamae Jude, MD;  Location: [facility];  Service: Gynecology;  Laterality: N/A;   ROBOTIC ASSISTED TOTAL HYSTERECTOMY WITH BILATERAL SALPINGO OOPHERECTOMY N/A 10/27/2020   Procedure: XI ROBOTIC ASSISTED TOTAL HYSTERECTOMY WITH BILATERAL SALPINGO OOPHORECTOMY;  Surgeon: Everitt Amber, MD;  Location: [facility];  Service: Gynecology;  Laterality: N/A;   SENTINEL NODE BIOPSY N/A 10/27/2020   Procedure: SENTINEL NODE BIOPSY;  Surgeon: Everitt Amber, MD;  Location: [facility];  Service: Gynecology;  Laterality: N/A;    Family History  Problem Relation Age of Onset   Anesthesia problems Mother        hard to wake post op    Cerebrovascular Disease Mother        Never had stroke, but had CEA.   Liver cancer Father        mets to liver    Bladder Cancer Father    Colon cancer Father         mets to colon    Schizophrenia Daughter        Schizoaffective disorder   Anesthesia problems Son        stopped breathing post op    Tremor Son    Anesthesia problems Granddaughter        PONV   High blood pressure Other    Diabetes Other    Arthritis Other    Schizophrenia Child    Tremor Child    Dementia Neg Hx    Esophageal cancer Neg Hx    Rectal cancer Neg Hx    Stomach cancer Neg Hx    Colon polyps Neg Hx     Social History   Socioeconomic History   Marital status: Legally Separated    Spouse name: Not on file   Number of children: 3   Years of education: 12   Highest education level: Not on file  Occupational History   Occupation: Retired     Comment: nanny   Occupation: retired  Tobacco Use   Smoking status: Never   Smokeless tobacco: Never  Scientific laboratory technician Use: Never used  Substance and Sexual Activity   Alcohol use: Never    Comment: quit 1978   Drug use: Never   Sexual activity: Not Currently  Other Topics Concern   Not on file  Social History Narrative    Lives at home. Her granddaughter lives with her and daughter    Right handed   Social Determinants of Health   Financial Resource Strain: Not on file  Food Insecurity: Not on file  Transportation Needs: Not on file  Physical Activity: Not on file  Stress: Not on file  Social Connections: Not on file  Intimate Partner Violence: Not on file      Review of systems: All other review of systems negative except as mentioned in the HPI.   Physical Exam: Vitals:   08/09/22 0844  BP: 132/72  Pulse:  72   Body mass index is 32.95 kg/m. Gen:      No acute distress HEENT:  sclera anicteric Abd:      soft, non-tender; no palpable masses, no distension Ext:    No edema Neuro: alert and oriented x 3 Psych: normal mood and affect  Data Reviewed:  Reviewed labs, radiology imaging, old records and pertinent past GI work up   Assessment and Plan/Recommendations:  ***  This visit  required *** minutes of patient care (this includes precharting, chart review, review of results, face-to-face time used for counseling as well as treatment plan and follow-up. The patient was provided an opportunity to ask questions and all were answered. The patient agreed with the plan and demonstrated an understanding of the instructions.  Damaris Hippo , MD    CC: [name], MD

## 2022-08-15 ENCOUNTER — Encounter: Payer: Self-pay | Admitting: Family Medicine

## 2022-08-15 NOTE — Progress Notes (Signed)
PTH, INTACT-67 TOTAL URINE PROTEIN-7.1 PROTEIN/CREAT RATIO-155 BUN/CREATINE RATIO-23 PHOSPHORUS-2.7

## 2022-08-21 ENCOUNTER — Ambulatory Visit (INDEPENDENT_AMBULATORY_CARE_PROVIDER_SITE_OTHER): Payer: Medicare Other | Admitting: Family Medicine

## 2022-08-21 ENCOUNTER — Encounter: Payer: Self-pay | Admitting: Family Medicine

## 2022-08-21 ENCOUNTER — Encounter: Payer: Self-pay | Admitting: Podiatry

## 2022-08-21 ENCOUNTER — Ambulatory Visit (INDEPENDENT_AMBULATORY_CARE_PROVIDER_SITE_OTHER): Payer: Medicare Other | Admitting: Podiatry

## 2022-08-21 VITALS — BP 120/70 | HR 90 | Temp 98.1°F | Ht 62.0 in | Wt 182.4 lb

## 2022-08-21 DIAGNOSIS — B351 Tinea unguium: Secondary | ICD-10-CM

## 2022-08-21 DIAGNOSIS — M79675 Pain in left toe(s): Secondary | ICD-10-CM | POA: Diagnosis not present

## 2022-08-21 DIAGNOSIS — N1831 Chronic kidney disease, stage 3a: Secondary | ICD-10-CM | POA: Diagnosis not present

## 2022-08-21 DIAGNOSIS — M79674 Pain in right toe(s): Secondary | ICD-10-CM

## 2022-08-21 DIAGNOSIS — E1165 Type 2 diabetes mellitus with hyperglycemia: Secondary | ICD-10-CM | POA: Diagnosis not present

## 2022-08-21 DIAGNOSIS — E538 Deficiency of other specified B group vitamins: Secondary | ICD-10-CM

## 2022-08-21 DIAGNOSIS — E7800 Pure hypercholesterolemia, unspecified: Secondary | ICD-10-CM

## 2022-08-21 DIAGNOSIS — Z1159 Encounter for screening for other viral diseases: Secondary | ICD-10-CM | POA: Diagnosis not present

## 2022-08-21 DIAGNOSIS — E1122 Type 2 diabetes mellitus with diabetic chronic kidney disease: Secondary | ICD-10-CM | POA: Diagnosis not present

## 2022-08-21 DIAGNOSIS — R600 Localized edema: Secondary | ICD-10-CM

## 2022-08-21 LAB — LIPID PANEL
Cholesterol: 119 mg/dL (ref 0–200)
HDL: 49 mg/dL (ref >39.00)
LDL Cholesterol: 41 mg/dL (ref 0–99)
NonHDL: 70.34
Total CHOL/HDL Ratio: 2
Triglycerides: 149 mg/dL (ref 0.0–149.0)
VLDL: 29.8 mg/dL (ref 0.0–40.0)

## 2022-08-21 LAB — HEMOGLOBIN A1C: Hgb A1c MFr Bld: 6.9 % — ABNORMAL HIGH (ref 4.6–6.5)

## 2022-08-21 MED ORDER — BUMETANIDE 2 MG PO TABS: 2.0000 mg | ORAL_TABLET | Freq: Every morning | ORAL | 1 refills | Status: DC

## 2022-08-21 MED ORDER — POTASSIUM CHLORIDE CRYS ER 20 MEQ PO TBCR: 20.0000 meq | EXTENDED_RELEASE_TABLET | Freq: Every day | ORAL | 0 refills | Status: DC

## 2022-08-21 MED ORDER — CYANOCOBALAMIN 1000 MCG/ML IJ SOLN
1000.0000 ug | Freq: Once | INTRAMUSCULAR | Status: AC
  Administered 2022-08-21: 1000 ug via INTRAMUSCULAR

## 2022-08-21 NOTE — Patient Instructions (Signed)
It was very nice to see you today!  See cardiologist   PLEASE NOTE:  If you had any lab tests please let us know if you have not heard back within a few days. You may see your results on MyChart before we have a chance to review them but we will give you a call once they are reviewed by [name]. If we ordered any referrals today, please let us know if you have not heard from their office within the next week.   Please try these tips to maintain a healthy lifestyle:  Eat most of your calories during the day when you are active. Eliminate processed foods including packaged sweets (pies, cakes, cookies), reduce intake of potatoes, white bread, white pasta, and white rice. Look for whole grain options, oat flour or almond flour.  Each meal should contain half fruits/vegetables, one quarter protein, and one quarter carbs (no bigger than a computer mouse).  Cut down on sweet beverages. This includes juice, soda, and sweet tea. Also watch fruit intake, though this is a healthier sweet option, it still contains natural sugar! Limit to 3 servings daily.  Drink at least 1 glass of water with each meal and aim for at least 8 glasses per day  Exercise at least 150 minutes every week.

## 2022-08-21 NOTE — Progress Notes (Signed)
This patient returns to my office for at risk foot care.  This patient requires this care by a professional since this patient will be at risk due to having diabetes and kidney disease. This patient is unable to cut nails herself since the patient cannot reach her nails.These nails are painful walking and wearing shoes.  This patient presents for at risk foot care today.  General Appearance  Alert, conversant and in no acute stress.  Vascular  Dorsalis pedis and posterior tibial  pulses are palpable  bilaterally.  Capillary return is within normal limits  bilaterally. Temperature is within normal limits  bilaterally.  Neurologic  Senn-Weinstein monofilament wire test within normal limits  bilaterally. Muscle power within normal limits bilaterally.  Nails Thick disfigured discolored nails with subungual debris  hallux nails bilaterally. No evidence of bacterial infection or drainage bilaterally.  Orthopedic  No limitations of motion  feet .  No crepitus or effusions noted.  No bony pathology or digital deformities noted.  HAV  B/L.  Midfoot  DJD  B/L.  Skin  normotropic skin with no porokeratosis noted bilaterally.  No signs of infections or ulcers noted.     Onychomycosis  Pain in right toes  Pain in left toes  Consent was obtained for treatment procedures.   Mechanical debridement of nails 1-5  bilaterally performed with a nail nipper.  Filed with dremel without incident.    Return office visit     10 weeks                 Told patient to return for periodic foot care and evaluation due to potential at risk complications.   Gardiner Barefoot DPM

## 2022-08-21 NOTE — Progress Notes (Signed)
Subjective:     Patient ID: [name], female    DOB: [birth year], 71 y.o.   MRN: [id number]  Chief Complaint  Patient presents with   Follow-up    4 week follow-up for dm and B 12 injection Not fasting Discuss medication change    HPI  DM type 2-intolerant of meds.  No exercise d/t pain, etc.  Some diet. B12 deficiency-getting B12 injections monthly.   Edema-doing better on bumex 38m daily but needs to increase to 244m  [facility]on repatha-ate 1 hr ago  Health Maintenance Due  Topic Date Due   OPHTHALMOLOGY EXAM  Never done   Hepatitis C Screening  Never done   COVID-19 Vaccine (3 - Pfizer risk series) 08/04/2020   Zoster Vaccines- Shingrix (2 of 2) 02/23/2021   HEMOGLOBIN A1C  06/27/2022    Past Medical History:  Diagnosis Date   Allergy    Anemia    Arthritis    Asthma    Dianosed years ago-no meds at this time   [facility]   [facility]    Cataract    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 name of MD    [place]   Diabetes mellitus    type 2    Diverticulosis    Dyslipidemia    Dyspnea    with exertion    Dysrhythmia    "skips beats"   [place]   Family history of adverse reaction to anesthesia    son stopped breathing during surgery , mother slow to wake up    Fibromyalgia 1980's   Generalized anxiety disorder    GERD (gastroesophageal reflux disease)    occ- will use Tums and Prolosec  prn    Heart murmur    Hyperlipemia    Hypertension    not on blood pressure meds due to allergies - last dose of meds- months ago    Hypothyroidism    Hypothyroidism (acquired)    Internal hemorrhoids    Irritable bowel syndrome 1980's   [facility] [place]   Major depressive disorder    Migraine headaches 1980's   On meds-well controlled   Mild  neurocognitive disorder 10/05/2019   [place]   Pernicious anemia    Pneumonia 2009   Several times over the past several years   PONV (postoperative nausea and vomiting)    was told by neurologist due to calcificaton in brain not to be put to sleep, N/V during Op and Recovery    Recurrent upper respiratory infection (URI)    Sleep apnea    no cpap   [place]   Vaginal polyp    benign per pt    Past Surgical History:  Procedure Laterality Date   CATARACT EXTRACTION Right    CHOLECYSTECTOMY  1983   COLONOSCOPY  2008   DB   DILATION AND CURETTAGE OF UTERUS  1972   HYSTEROSCOPY WITH D & C  06/29/2011   Procedure: DILATATION AND CURETTAGE (D&C) /HYSTEROSCOPY;  Surgeon: TaDonnamae JudeMD;  [facility];  Service: Gynecology;  Laterality: N/A;   ROBOTIC ASSISTED TOTAL HYSTERECTOMY WITH BILATERAL SALPINGO OOPHERECTOMY N/A 10/27/2020   Procedure: XI ROBOTIC ASSISTED TOTAL HYSTERECTOMY WITH BILATERAL SALPINGO OOPHORECTOMY;  Surgeon: RoEveritt AmberMD;  [facility];  Service: Gynecology;  Laterality: N/A;   SENTINEL NODE BIOPSY N/A 10/27/2020   Procedure: SENTINEL NODE BIOPSY;  Surgeon: Everitt Amber, MD;  Location: [facility];  Service: Gynecology;  Laterality: N/A;    Outpatient Medications Prior to Visit  Medication Sig Dispense Refill   acetaminophen (TYLENOL) 500 MG tablet Take 500 mg by mouth every 6 (six) hours as needed for moderate pain.     aspirin EC 325 MG tablet Take 1 tablet (325 mg total) by mouth daily. 90 tablet 3   Blood Glucose Monitoring Suppl (ACCU-CHEK GUIDE) w/Device KIT Use to test blood sugar 3 times daily. 1 kit 0   Cholecalciferol 25 MCG (1000 UT) capsule Take 1 capsule (1,000 Units total) by mouth daily. 90 capsule 3   cyanocobalamin (,VITAMIN B-12,) 1000 MCG/ML injection Inject 1,000 mcg into the muscle every 30 (thirty) days.      diphenoxylate-atropine (LOMOTIL) 2.5-0.025 MG tablet TAKE 1 TABLET BY MOUTH TWICE DAILY AS  NEEDED FOR DIARRHEA OR  LOOSE  STOOLS 60 tablet 2   estradiol (ESTRACE) 0.1 MG/GM vaginal cream Place 0.5 g vaginally 2 (two) times a week. Place 0.5g nightly for two weeks then twice a week after 30 g 11   Evolocumab (REPATHA SURECLICK) 400 MG/ML SOAJ INJECT 1 PEN SUBCUTANEOUSLY EVERY 14 DAYS 6 mL 3   gabapentin (NEURONTIN) 100 MG capsule Take 1 capsule (100 mg total) by mouth at bedtime. 90 capsule 1   glucose blood (ACCU-CHEK GUIDE) test strip Use to test blood sugar 3 times daily 100 each 2   Lancets Misc. (ACCU-CHEK FASTCLIX LANCET) KIT Use to check blood sugar 3 times daily 1 kit 0   levothyroxine (SYNTHROID) 50 MCG tablet Take 1 tablet (50 mcg total) by mouth daily. 90 tablet 3   meclizine (ANTIVERT) 12.5 MG tablet Take 1 tablet (12.5 mg total) by mouth 3 (three) times daily as needed for dizziness. 30 tablet 0   Multiple Vitamin (MULTIVITAMIN WITH MINERALS) TABS tablet Take 1 tablet by mouth daily. 30 tablet 0   ondansetron (ZOFRAN) 4 MG tablet Take 1 tablet (4 mg total) by mouth every 8 (eight) hours as needed for nausea or vomiting. 20 tablet 0   pantoprazole (PROTONIX) 20 MG tablet Take 1 tablet (20 mg total) by mouth daily. 90 tablet 3   polyethylene glycol (MIRALAX / GLYCOLAX) 17 g packet Take 17 g by mouth daily as needed for mild constipation. 14 each 0   Potassium 99 MG TABS Take 2 tablets (198 mg total) by mouth daily. 180 tablet 3   bumetanide (BUMEX) 1 MG tablet Take 1 tablet (1 mg total) by mouth every morning. 90 tablet 1   bumetanide (BUMEX) 1 MG tablet Take 1 tablet (1 mg total) by mouth daily. 90 tablet 0   No facility-administered medications prior to visit.    Allergies  Allergen Reactions   Atenolol     Stomach problems, chills   Atorvastatin     Other reaction(s): retain fld   Canagliflozin     ALL DIABETIC MEDS PER PT   abdominal pain, headache   Losartan Potassium     chills, HA, stomach pain   Acarbose Nausea Only     headache,nausea   Cinnamon      Stomach pain   Empagliflozin      headaches   Glimepiride     ALL DIABETIC MEDS PER PT  abdominal pain, headache   Metformin And Related Nausea And Vomiting and Other (See  Comments)    Patient states that she has severe chills, headache and cramping additionally. Says blood sugar is uncontrolled because of this   Other Other (See Comments)    ALL DIABETIC MEDS PER PT    Pioglitazone Other (See Comments)    ALL DIABETIC MEDS PER PT  Other reaction(s): peripheral edema   Pravastatin     Other reaction(s): retain fld   Sitagliptin     ALL DIABETIC MEDS PER PT  Other reaction(s): headache   Spironolactone-Hctz Other (See Comments)   Statins     Myalgia   Sulfamethoxazole Other (See Comments)    ALL DIABETIC MEDS PER PT    Tramadol Hcl Itching   Acarbose Other (See Comments)   Atenolol Other (See Comments)   Atorvastatin Other (See Comments)   Atorvastatin Calcium     Other reaction(s): retain fld   Canagliflozin Other (See Comments)   Crestor [Rosuvastatin] Other (See Comments)   Empagliflozin Other (See Comments)   Glimepiride Other (See Comments)   Losartan Potassium Other (See Comments)   Metformin Other (See Comments)   Other Other (See Comments)   Pioglitazone Other (See Comments)   Pravastatin Other (See Comments)   Rosuvastatin Calcium    Spironolactone-Hctz Other (See Comments)   Tramadol    Lasix [Furosemide] Rash    Sugars increased   Sulfa Antibiotics Rash and Other (See Comments)    Mother has told patient in the past not to take, she cannot recall there reaction   ROS neg/noncontributory except as noted HPI/below Severe chills at times.  No fever.  Last hrs to all day.   Getting for past 1 mo-some chest "twinges".  Can be at rest.  Last a second and can recur a few times/day.  No other symptoms.  Will sch w/card. Bp's mostly ok.       Objective:     BP 120/70   Pulse 90   Temp 98.1 F (36.7 C) (Temporal)   Ht _0  (1.575 m)   Wt 182 lb 6 oz (82.7  kg)   SpO2 99%   BMI 33.36 kg/m  Wt Readings from Last 3 Encounters:  08/21/22 182 lb 6 oz (82.7 kg)  08/09/22 180 lb 2 oz (81.7 kg)  07/31/22 183 lb 11.2 oz (83.3 kg)    Physical Exam   Gen: WDWN NAD [facility] HEENT: NCAT, conjunctiva not injected, sclera nonicteric NECK:  supple, no thyromegaly, no nodes, no carotid bruits CARDIAC: RRR, S1S2+, 1/6 murmur. DP1+B LUNGS: CTAB. No wheezes EXT:  no edema MSK: cane  NEURO: A&O x3.  CN II-XII intact. .  Mild tremor PSYCH: normal mood. Good eye contact  Reviewed labs from [facility].       Assessment & Plan:   Problem List Items Addressed This Visit       Endocrine   Type 2 diabetes mellitus with stage 3 chronic kidney disease, without long-term current use of insulin (HCC)   Relevant Orders   Hemoglobin A1c     Other   HLD (hyperlipidemia)   Relevant Medications   bumetanide (BUMEX) 2 MG tablet   Other Relevant Orders   Lipid panel   B12 deficiency - Primary   Relevant Medications   cyanocobalamin (VITAMIN B12) injection 1,000 mcg   Local edema   Other Visit Diagnoses     Encounter for hepatitis C screening test for low risk patient       Relevant Orders   Hepatitis C antibody  DM type 2 w/CKD stage 3a.  Chronic.  Intol meds.  Check A1C.  Seeing [facility]-chronic.  Controlled.  On Repatha-continue.  Check lipids.  Has CAD/carotid dz.   Edema-chronic.  Better if on bumex 794m-will inc.  Montior closely.  Keep legs elevated.  Ad Kcl 271m as K 3.9 on 1 mg and OTC K and we are increasing the Bumex to 94m66mStop the OTC potassium. B12 deficiency-getting monthly injections.  Given today.  Continue.   F/u monthly for B12 nurse and in 2 mo me.   See Cardiology  Meds ordered this encounter  Medications   cyanocobalamin (VITAMIN B12) injection 1,000 mcg   bumetanide (BUMEX) 2 MG tablet    Sig: Take 1 tablet (2 mg total) by mouth every morning.    Dispense:  90 tablet    Refill:  1   potassium chloride SA  (KLOR-CON M) 20 MEQ tablet    Sig: Take 1 tablet (20 mEq total) by mouth daily.    Dispense:  90 tablet    Refill:  0    AnnWellington HampshireD

## 2022-08-22 LAB — HEPATITIS C ANTIBODY: Hepatitis C Ab: NONREACTIVE

## 2022-08-23 ENCOUNTER — Telehealth: Payer: Self-pay | Admitting: Interventional Cardiology

## 2022-08-23 NOTE — Telephone Encounter (Signed)
Called patient. Patient complaining about chest pain that comes and goes. Last episode was this morning. Made patient an appointment with DOD, Dr. Angelena Form tomorrow. Encouraged patient to go to nearest ED if chest pain gets worse or does not go away with rest.

## 2022-08-23 NOTE — Telephone Encounter (Signed)
Pt c/o of Chest Pain: STAT if CP now or developed within 24 hours  1. Are you having CP right now? No  2. Are you experiencing any other symptoms (ex. SOB, nausea, vomiting, sweating)? No  3. How long have you been experiencing CP? About a month  4. Is your CP continuous or coming and going? Coming and going   5. Have you taken Nitroglycerin? No  Pt states pain in her chest started about a month ago and she says it wasn't anything to worry about and that it was coming and going. Pt states more recently it has become more. Requesting to speak to someone.  ?

## 2022-08-24 ENCOUNTER — Encounter: Payer: Self-pay | Admitting: Cardiovascular Disease

## 2022-08-24 ENCOUNTER — Ambulatory Visit: Payer: Medicare Other | Attending: Cardiovascular Disease | Admitting: Cardiovascular Disease

## 2022-08-24 VITALS — BP 134/74 | HR 87 | Ht 62.0 in | Wt 178.4 lb

## 2022-08-24 DIAGNOSIS — I2510 Atherosclerotic heart disease of native coronary artery without angina pectoris: Secondary | ICD-10-CM

## 2022-08-24 DIAGNOSIS — R079 Chest pain, unspecified: Secondary | ICD-10-CM | POA: Diagnosis not present

## 2022-08-24 NOTE — Patient Instructions (Signed)
Medication Instructions:  The current medical regimen is effective;  continue present plan and medications.  *If you need a refill on your cardiac medications before your next appointment, please call your pharmacy*  Follow-Up: At [facility], you and your health needs are our priority.  As part of our continuing mission to provide you with exceptional heart care, we have created designated Provider Care Teams.  These Care Teams include your primary Cardiologist (physician) and Advanced Practice Providers (APPs -  Physician Assistants and Nurse Practitioners) who all work together to provide you with the care you need, when you need it.  We recommend signing up for the patient portal called "MyChart".  Sign up information is provided on this After Visit Summary.  MyChart is used to connect with patients for Virtual Visits (Telemedicine).  Patients are able to view lab/test results, encounter notes, upcoming appointments, etc.  Non-urgent messages can be sent to your provider as well.   To learn more about what you can do with MyChart, go to [email or web address].    Your next appointment:   3 month(s)  The format for your next appointment:   In Person  Provider:   Larae Grooms, MD     Important Information About Sugar

## 2022-08-24 NOTE — Progress Notes (Signed)
Chief Complaint  Patient presents with   Follow-up    Chest pain   History of Present Illness: 71 yo female with history of mild CAD, PVCs, PACs, anemia, CKD, dementia, DM, HLD, GERD, fibromyalgia, hypothyroidism, obesity, sleep apnea and prior subarachnoid hemorrhage who is here today as an add on to my schedule. She is followed in our office by Dr. Irish Lack. Coronary CTA in May 2021 with mild three vessel CAD. She has had issues over the last few years with LE edema which has been controlled with Bumex. She called our office on 08/23/22 with c/o chest pain. She tells me today that she has been having chest pain for one month. The pain started as "twinges" one month ago. These last for a few seconds. Last night she had pain while lying in bed that lasted for 3-5 seconds. She reminds me today that she has allergies to 35 medications and she is concerned that there is not "chills" listed on her allergy list as a symptom. She feels well overall other than chills.  No dyspnea with exertion. She has some LE edema during the day that resolves at night.  Primary Care Physician: [name], MD  [facility]: Irish Lack  Past Medical History:  Diagnosis Date   Allergy    Anemia    Arthritis    Asthma    Dianosed years ago-no meds at this time   Cancer [facility]) 2021   endometrial    Cataract    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 name of MD    Dementia ([place])    Diabetes mellitus    type 2    Diverticulosis    Dyslipidemia    Dyspnea    with exertion    Dysrhythmia    "skips beats"   Endometrial cancer ([place]) 2021   Family history of adverse reaction to anesthesia    son stopped breathing during surgery , mother slow to wake up    Fibromyalgia 1980's   Generalized anxiety disorder    GERD (gastroesophageal reflux disease)    occ- will use Tums and  Prolosec  prn    Heart murmur    Hyperlipemia    Hypertension    not on blood pressure meds due to allergies - last dose of meds- months ago    Hypothyroidism    Hypothyroidism (acquired)    Internal hemorrhoids    Irritable bowel syndrome 1980's   Liver cancer ([place])    Major depressive disorder    Migraine headaches 1980's   On meds-well controlled   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   Morbid obesity ([place])    Pernicious anemia    Pneumonia 2009   Several times over the past several years   PONV (postoperative nausea and vomiting)    was told by neurologist due to calcificaton in brain not to be put to sleep, N/V during Op and Recovery    Recurrent upper respiratory infection (URI)    Sleep apnea    no cpap   Subarachnoid hemorrhage ([place]) 2010   Vaginal polyp    benign per pt    Past Surgical History:  Procedure Laterality Date   CATARACT EXTRACTION Right    CHOLECYSTECTOMY  1983   COLONOSCOPY  2008   DB   [place]  HYSTEROSCOPY WITH D & C  06/29/2011   Procedure: DILATATION AND CURETTAGE (D&C) /HYSTEROSCOPY;  Surgeon: Donnamae Jude, MD;  Location: [facility];  Service: Gynecology;  Laterality: N/A;   ROBOTIC ASSISTED TOTAL HYSTERECTOMY WITH BILATERAL SALPINGO OOPHERECTOMY N/A 10/27/2020   Procedure: XI ROBOTIC ASSISTED TOTAL HYSTERECTOMY WITH BILATERAL SALPINGO OOPHORECTOMY;  Surgeon: Everitt Amber, MD;  Location: [facility];  Service: Gynecology;  Laterality: N/A;   SENTINEL NODE BIOPSY N/A 10/27/2020   Procedure: SENTINEL NODE BIOPSY;  Surgeon: Everitt Amber, MD;  Location: [facility];  Service: Gynecology;  Laterality: N/A;    Current Outpatient Medications  Medication Sig Dispense Refill   acetaminophen (TYLENOL) 500 MG tablet Take 500 mg by mouth every 6 (six) hours as needed for moderate pain.     aspirin EC 325 MG tablet Take 1 tablet (325 mg total) by mouth daily. 90 tablet 3   Blood Glucose Monitoring Suppl (ACCU-CHEK GUIDE) w/Device KIT  Use to test blood sugar 3 times daily. 1 kit 0   bumetanide (BUMEX) 2 MG tablet Take 1 tablet (2 mg total) by mouth every morning. 90 tablet 1   Cholecalciferol 25 MCG (1000 UT) capsule Take 1 capsule (1,000 Units total) by mouth daily. 90 capsule 3   cyanocobalamin (,VITAMIN B-12,) 1000 MCG/ML injection Inject 1,000 mcg into the muscle every 30 (thirty) days.      diphenoxylate-atropine (LOMOTIL) 2.5-0.025 MG tablet TAKE 1 TABLET BY MOUTH TWICE DAILY AS NEEDED FOR DIARRHEA OR  LOOSE  STOOLS 60 tablet 2   estradiol (ESTRACE) 0.1 MG/GM vaginal cream Place 0.5 g vaginally 2 (two) times a week. Place 0.5g nightly for two weeks then twice a week after 30 g 11   Evolocumab (REPATHA SURECLICK) 962 MG/ML SOAJ INJECT 1 PEN SUBCUTANEOUSLY EVERY 14 DAYS 6 mL 3   glucose blood (ACCU-CHEK GUIDE) test strip Use to test blood sugar 3 times daily 100 each 2   Lancets Misc. (ACCU-CHEK FASTCLIX LANCET) KIT Use to check blood sugar 3 times daily 1 kit 0   levothyroxine (SYNTHROID) 50 MCG tablet Take 1 tablet (50 mcg total) by mouth daily. 90 tablet 3   Multiple Vitamin (MULTIVITAMIN WITH MINERALS) TABS tablet Take 1 tablet by mouth daily. 30 tablet 0   ondansetron (ZOFRAN) 4 MG tablet Take 1 tablet (4 mg total) by mouth every 8 (eight) hours as needed for nausea or vomiting. 20 tablet 0   pantoprazole (PROTONIX) 20 MG tablet Take 1 tablet (20 mg total) by mouth daily. 90 tablet 3   potassium chloride SA (KLOR-CON M) 20 MEQ tablet Take 1 tablet (20 mEq total) by mouth daily. 90 tablet 0   gabapentin (NEURONTIN) 100 MG capsule Take 1 capsule (100 mg total) by mouth at bedtime. (Patient not taking: Reported on 08/24/2022) 90 capsule 1   meclizine (ANTIVERT) 12.5 MG tablet Take 1 tablet (12.5 mg total) by mouth 3 (three) times daily as needed for dizziness. (Patient not taking: Reported on 08/24/2022) 30 tablet 0   polyethylene glycol (MIRALAX / GLYCOLAX) 17 g packet Take 17 g by mouth daily as needed for mild constipation.  (Patient not taking: Reported on 08/24/2022) 14 each 0   No current facility-administered medications for this visit.    Allergies  Allergen Reactions   Atenolol     Stomach problems, chills   Atorvastatin     Other reaction(s): retain fld   Canagliflozin     ALL DIABETIC MEDS PER PT   abdominal pain, headache   Losartan Potassium  chills, HA, stomach pain   Acarbose Nausea Only     headache,nausea   Cinnamon     Stomach pain   Empagliflozin      headaches   Glimepiride     ALL DIABETIC MEDS PER PT  abdominal pain, headache   Metformin And Related Nausea And Vomiting and Other (See Comments)    Patient states that she has severe chills, headache and cramping additionally. Says blood sugar is uncontrolled because of this   Other Other (See Comments)    ALL DIABETIC MEDS PER PT    Pioglitazone Other (See Comments)    ALL DIABETIC MEDS PER PT  Other reaction(s): peripheral edema   Pravastatin     Other reaction(s): retain fld   Sitagliptin     ALL DIABETIC MEDS PER PT  Other reaction(s): headache   Spironolactone-Hctz Other (See Comments)   Statins     Myalgia   Sulfamethoxazole Other (See Comments)    ALL DIABETIC MEDS PER PT    Tramadol Hcl Itching   Acarbose Other (See Comments)   Atenolol Other (See Comments)   Atorvastatin Other (See Comments)   Atorvastatin Calcium     Other reaction(s): retain fld   Canagliflozin Other (See Comments)   Crestor [Rosuvastatin] Other (See Comments)   Empagliflozin Other (See Comments)   Glimepiride Other (See Comments)   Losartan Potassium Other (See Comments)   Metformin Other (See Comments)   Other Other (See Comments)   Pioglitazone Other (See Comments)   Pravastatin Other (See Comments)   Rosuvastatin Calcium    Spironolactone-Hctz Other (See Comments)   Tramadol    Lasix [Furosemide] Rash    Sugars increased   Sulfa Antibiotics Rash and Other (See Comments)    Mother has told patient in the past not to take, she  cannot recall there reaction    Social History   Socioeconomic History   Marital status: Legally Separated    Spouse name: Not on file   Number of children: 3   Years of education: 12   Highest education level: Not on file  Occupational History   Occupation: Retired     Comment: nanny   Occupation: retired  Tobacco Use   Smoking status: Never   Smokeless tobacco: Never  Scientific laboratory technician Use: Never used  Substance and Sexual Activity   Alcohol use: Never    Comment: quit 1978   Drug use: Never   Sexual activity: Not Currently  Other Topics Concern   Not on file  Social History Narrative    Lives at home. Her granddaughter lives with her and daughter    Right handed   Social Determinants of Health   Financial Resource Strain: Not on file  Food Insecurity: Not on file  Transportation Needs: Not on file  Physical Activity: Not on file  Stress: Not on file  Social Connections: Not on file  Intimate Partner Violence: Not on file    Family History  Problem Relation Age of Onset   Anesthesia problems Mother        hard to wake post op    Cerebrovascular Disease Mother        Never had stroke, but had CEA.   Liver cancer Father        mets to liver    Bladder Cancer Father    Colon cancer Father        mets to colon    Schizophrenia Daughter  Schizoaffective disorder   Anesthesia problems Son        stopped breathing post op    Tremor Son    Anesthesia problems Granddaughter        PONV   High blood pressure Other    Diabetes Other    Arthritis Other    Schizophrenia Child    Tremor Child    Dementia Neg Hx    Esophageal cancer Neg Hx    Rectal cancer Neg Hx    Stomach cancer Neg Hx    Colon polyps Neg Hx     Review of Systems:  As stated in the HPI and otherwise negative.   BP 134/74   Pulse 87   Ht _0  (1.575 m)   Wt 178 lb 6.6 oz (80.9 kg)   SpO2 100%   BMI 32.63 kg/m   Physical Examination: General: Well developed, well  nourished, NAD  HEENT: OP clear, mucus membranes moist  SKIN: warm, dry. No rashes. Neuro: No focal deficits  Musculoskeletal: Muscle strength 5/5 all ext  Psychiatric: Mood and affect normal  Neck: No JVD, no carotid bruits, no thyromegaly, no lymphadenopathy.  Lungs:Clear bilaterally, no wheezes, rhonci, crackles Cardiovascular: Regular rate and rhythm. No murmurs, gallops or rubs. Abdomen:Soft. Bowel sounds present. Non-tender.  Extremities: No lower extremity edema. Pulses are 2 + in the bilateral DP/PT.  EKG:  EKG is ordered today. The ekg ordered today demonstrates Sinus  Recent Labs: 02/08/2022: ALT 15 05/30/2022: Platelets 208.0; TSH 0.50 06/29/2022: BUN 27; Creatinine 1.2; Hemoglobin 11.6; Potassium 3.9; Sodium 140   Lipid Panel    Component Value Date/Time   CHOL 119 08/21/2022 1140   CHOL 134 08/16/2021 1018   TRIG 149.0 08/21/2022 1140   HDL 49.00 08/21/2022 1140   HDL 36 (L) 08/16/2021 1018   CHOLHDL 2 08/21/2022 1140   VLDL 29.8 08/21/2022 1140   LDLCALC 41 08/21/2022 1140   LDLCALC 69 08/16/2021 1018   LDLDIRECT 130 (H) 05/10/2020 1659     Wt Readings from Last 3 Encounters:  08/24/22 178 lb 6.6 oz (80.9 kg)  08/21/22 182 lb 6 oz (82.7 kg)  08/09/22 180 lb 2 oz (81.7 kg)    Assessment and Plan:   1. CAD with chest pain: She has evidence of mild CAD by coronary CTA in 2021. Her chest pain today seems atypical. The pain lasts for 2-3 seconds and is at rest. EKG is normal. I do not think her chest pain is cardiac related. No cardiac workup is indicated  Labs/ tests ordered today include:   Orders Placed This Encounter  Procedures   EKG 12-Lead   Disposition:   F/U with Dr. Irish Lack or office APP in 4 months  Signed, Lauree Chandler, MD, [facility] 08/24/2022 11:13 AM    [address], [place], [place]  [address] Phone: [phone]; Fax: [phone]

## 2022-09-03 NOTE — Progress Notes (Deleted)
Assessment/Plan:      Tremor Her tremor is really more consistent with essential tremor, which she has been diagnosed with for 10 or 15 years. Talked the patient about nature and pathophysiology of essential tremor. Patient reports tremor is bad enough to require medication.  It does not sound like she has ever been on medication for essential tremor.  She is very poorly tolerant of "all" medications (reports allergies to "all" diabetic medications), so we decided to start with very low-dose primidone.  She will start primidone, 50 mg, half tablet at bedtime for 1 week and then increase to 1 tablet at bedtime thereafter.  Risk, benefits, side effects discussed. Patient was given primidone but ***.  She generally just is not able to tolerate medication well (see allergy list with reporting of allergies to all diabetic medications).  I told the patient today that it is doubtful that there are any other medications that she is going to be able to tolerate, if she could not tolerate 25 mg of primidone.  I can see she was given 100 mg of gabapentin in June for neuropathy ***     2.  ? Fahr disease             -She has radiographic (bilateral basal ganglia calcifications, cerebellar nuclei calcifications) evidence but I am not really clearly convinced of clinical evidence.  Even though she does have a movement disorder (essential tremor) I am not clearly convinced of parkinsonism.  She has had a normal DaTscan in the past, which I reviewed.   The diagnosis of Fahr's does not require genetic testing, although it is thought to be inherited in an autosomal dominant fashion.  I am not even sure that genetic testing is widely available for this.  I have called several labs, and testing is not available.  I will continue to look around regarding that.  However, her radiographic findings have not really changed since 2010, and I would expect if she had Fahr's disease that this would have progressed by now.   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  She was given low-dose [facility]odopa at [facility], half tablet twice per day.  She only took it 1 day.  I really do not see anything clinically that levodopa would help and we decided to hold off on that for right now.   3.  Gait instability  -I think that this is all likely secondary to diabetic pn.  She is currently not on any diabetic medications because she reports allergies to all of them.  Patient reports diabetes for about 15 years.  Her last A1c was fairly good at 6.9.  She and I discussed that physical therapy can be valuable for the gait instability associated with peripheral neuropathy.  She is getting physical therapy started soon.  We also discussed control of diabetes.  We also discussed that this is likely the source of her hand paresthesias.  She has had an EMG in the past confirming neuropathy.   4.  Headache, with long-term history of migraine             -declines referral to HA clinic.             -Headache slightly worsened after she went off of amitriptyline (which caused dizziness) Subjective:   [name] was seen today in follow up for essential tremor.  My previous records were reviewed prior to todays visit.  Because of the patient's poor tolerance to medication, we just  started her on very low-dose primidone last visit, 50 mg daily.  She reports ***that she is not taking that medication.  She was seen by cardiology on September 8 for what was felt noncardiac chest pain.  Current prescribed movement disorder medications: ***Primidone, 50 mg daily   PREVIOUS MEDICATIONS: {Parkinson's RX:18200}  ALLERGIES:   Allergies  Allergen Reactions   Atenolol     Stomach problems, chills   Atorvastatin     Other reaction(s): retain fld   Canagliflozin     ALL DIABETIC MEDS PER PT   abdominal pain, headache   Losartan Potassium     chills, HA, stomach pain   Acarbose Nausea Only     headache,nausea   Cinnamon      Stomach pain   Empagliflozin      headaches   Glimepiride     ALL DIABETIC MEDS PER PT  abdominal pain, headache   Metformin And Related Nausea And Vomiting and Other (See Comments)    Patient states that she has severe chills, headache and cramping additionally. Says blood sugar is uncontrolled because of this   Other Other (See Comments)    ALL DIABETIC MEDS PER PT    Pioglitazone Other (See Comments)    ALL DIABETIC MEDS PER PT  Other reaction(s): peripheral edema   Pravastatin     Other reaction(s): retain fld   Sitagliptin     ALL DIABETIC MEDS PER PT  Other reaction(s): headache   Spironolactone-Hctz Other (See Comments)   Statins     Myalgia   Sulfamethoxazole Other (See Comments)    ALL DIABETIC MEDS PER PT    Tramadol Hcl Itching   Acarbose Other (See Comments)   Atenolol Other (See Comments)   Atorvastatin Other (See Comments)   Atorvastatin Calcium     Other reaction(s): retain fld   Canagliflozin Other (See Comments)   Crestor [Rosuvastatin] Other (See Comments)   Empagliflozin Other (See Comments)   Glimepiride Other (See Comments)   Losartan Potassium Other (See Comments)   Metformin Other (See Comments)   Other Other (See Comments)   Pioglitazone Other (See Comments)   Pravastatin Other (See Comments)   Rosuvastatin Calcium    Spironolactone-Hctz Other (See Comments)   Tramadol    Lasix [Furosemide] Rash    Sugars increased   Sulfa Antibiotics Rash and Other (See Comments)    Mother has told patient in the past not to take, she cannot recall there reaction    CURRENT MEDICATIONS:  No outpatient medications have been marked as taking for the 09/04/22 encounter (Appointment) with Madilynne Mullan, Eustace Quail, DO.      Objective:    PHYSICAL EXAMINATION:    VITALS:  There were no vitals filed for this visit.  GEN:  The patient appears stated age and is in NAD. HEENT:  Normocephalic, atraumatic.  The mucous membranes are moist. The superficial temporal  arteries are without ropiness or tenderness. CV:  RRR Lungs:  CTAB Neck/HEME:  There are no carotid bruits bilaterally.   Neurological examination:   Orientation: The patient is alert and oriented x3.  Cranial nerves: There is mild right facial droop.  Extraocular muscles are intact. The visual fields are full to confrontational testing. The speech is fluent and clear. Soft palate rises symmetrically and there is no tongue deviation. Hearing is intact to conversational tone. Sensation: Sensation is intact to light touch throughout (facial, trunk, extremities). Vibration is decreased distally. There is no extinction with double simultaneous stimulation.  Motor:  Strength is 5/5 in the bilateral upper and lower extremities.   Shoulder shrug is equal and symmetric.  There is no pronator drift. Deep tendon reflexes: Deep tendon reflexes are 2-/4 at the bilateral biceps, triceps, brachioradialis, patella and achilles. Plantar responses are downgoing bilaterally.   Movement examination: Tone: There is nl tone in the bilateral upper extremities.  The tone in the lower extremities is nl.  Abnormal movements: there is rare rest tremor on the LUE that doesn't increase with distraction.  No postural tremor.  There is some head tremor.  There is mild intention tremor.  Mild trouble with archimedes spirals.   Coordination:  There is no decremation with RAM's, with any form of RAMS, including alternating supination and pronation of the forearm, hand opening and closing, finger taps, heel taps and toe taps. Gait and Station: The patient has mild difficulty arising out of a deep-seated chair without the use of the hands. The patient's stride length is good with the walker.  Without the walker, she is tenuous and wide based but not shuffling or short stepped.   I have reviewed and interpreted the following labs independently   Chemistry      Component Value Date/Time   NA 140 06/29/2022 0000   K 3.9 06/29/2022  0000   CL 101 06/29/2022 0000   CO2 32 (A) 06/29/2022 0000   BUN 27 (A) 06/29/2022 0000   CREATININE 1.2 (A) 06/29/2022 0000   CREATININE 0.99 02/08/2022 0621   GLU 174 06/29/2022 0000      Component Value Date/Time   CALCIUM 9.6 06/29/2022 0000   ALKPHOS 46 02/08/2022 0621   AST 19 02/08/2022 0621   ALT 15 02/08/2022 0621   BILITOT 0.6 02/08/2022 0621   BILITOT 0.6 08/16/2021 1018      Lab Results  Component Value Date   WBC 4.4 05/30/2022   HGB 11.6 (A) 06/29/2022   HCT 35.6 (L) 05/30/2022   MCV 87.2 05/30/2022   PLT 208.0 05/30/2022   Lab Results  Component Value Date   TSH 0.50 05/30/2022     Chemistry      Component Value Date/Time   NA 140 06/29/2022 0000   K 3.9 06/29/2022 0000   CL 101 06/29/2022 0000   CO2 32 (A) 06/29/2022 0000   BUN 27 (A) 06/29/2022 0000   CREATININE 1.2 (A) 06/29/2022 0000   CREATININE 0.99 02/08/2022 0621   GLU 174 06/29/2022 0000      Component Value Date/Time   CALCIUM 9.6 06/29/2022 0000   ALKPHOS 46 02/08/2022 0621   AST 19 02/08/2022 0621   ALT 15 02/08/2022 0621   BILITOT 0.6 02/08/2022 0621   BILITOT 0.6 08/16/2021 1018     Lab Results  Component Value Date   HGBA1C 6.9 (H) 08/21/2022       Total time spent on today's visit was ***30 minutes, including both face-to-face time and nonface-to-face time.  Time included that spent on review of records (prior notes available to me/labs/imaging if pertinent), discussing treatment and goals, answering patient's questions and coordinating care.  Cc:  [name], MD

## 2022-09-04 ENCOUNTER — Ambulatory Visit: Payer: Medicare Other | Admitting: Neurology

## 2022-09-18 ENCOUNTER — Ambulatory Visit: Payer: Medicare Other

## 2022-09-19 ENCOUNTER — Ambulatory Visit (INDEPENDENT_AMBULATORY_CARE_PROVIDER_SITE_OTHER): Payer: Medicare Other | Admitting: *Deleted

## 2022-09-19 DIAGNOSIS — E538 Deficiency of other specified B group vitamins: Secondary | ICD-10-CM

## 2022-09-19 MED ORDER — CYANOCOBALAMIN 1000 MCG/ML IJ SOLN
1000.0000 ug | Freq: Once | INTRAMUSCULAR | Status: AC
  Administered 2022-09-19: 1000 ug via INTRAMUSCULAR

## 2022-09-19 NOTE — Progress Notes (Signed)
Per orders of Dr. Cherlynn Kaiser, injection of monthly B 12  given in right deltoid per patient preference by Zacarias Pontes, CMA. Patient tolerated injection well. Patient reminded to schedule next injection.

## 2022-09-20 ENCOUNTER — Other Ambulatory Visit: Payer: Self-pay | Admitting: Family Medicine

## 2022-09-27 ENCOUNTER — Ambulatory Visit: Payer: Medicare Other

## 2022-10-01 ENCOUNTER — Telehealth: Payer: Self-pay | Admitting: Surgery

## 2022-10-01 NOTE — Telephone Encounter (Signed)
Pt called in requesting to be scheduled for follow up appointment. States she was told to follow up every 6 months following her surgery in 2021 but has not been seen since 09/12/21 according to chart.

## 2022-10-01 NOTE — Telephone Encounter (Signed)
Called pt to inform her she will be scheduled with Dr Delsa Sale but date is pending. Please note, pt unavailable for appointments the following dates.   10/19, 10/20, 10/23, 10/25, 10/31 11/1, 11/16 12/5, 12/12, 12/22 1/6, 1/26

## 2022-10-02 ENCOUNTER — Ambulatory Visit: Payer: Medicare Other | Admitting: Obstetrics and Gynecology

## 2022-10-08 ENCOUNTER — Encounter: Payer: Self-pay | Admitting: *Deleted

## 2022-10-17 ENCOUNTER — Ambulatory Visit (INDEPENDENT_AMBULATORY_CARE_PROVIDER_SITE_OTHER): Payer: Medicare Other | Admitting: Family Medicine

## 2022-10-17 ENCOUNTER — Encounter: Payer: Self-pay | Admitting: Family Medicine

## 2022-10-17 ENCOUNTER — Ambulatory Visit: Payer: Medicare Other

## 2022-10-17 VITALS — BP 124/72 | HR 97 | Temp 98.0°F | Ht 62.0 in | Wt 189.0 lb

## 2022-10-17 DIAGNOSIS — E1122 Type 2 diabetes mellitus with diabetic chronic kidney disease: Secondary | ICD-10-CM | POA: Diagnosis not present

## 2022-10-17 DIAGNOSIS — R600 Localized edema: Secondary | ICD-10-CM

## 2022-10-17 DIAGNOSIS — E538 Deficiency of other specified B group vitamins: Secondary | ICD-10-CM | POA: Diagnosis not present

## 2022-10-17 DIAGNOSIS — R4189 Other symptoms and signs involving cognitive functions and awareness: Secondary | ICD-10-CM

## 2022-10-17 DIAGNOSIS — E1165 Type 2 diabetes mellitus with hyperglycemia: Secondary | ICD-10-CM | POA: Diagnosis not present

## 2022-10-17 DIAGNOSIS — N183 Chronic kidney disease, stage 3 unspecified: Secondary | ICD-10-CM

## 2022-10-17 DIAGNOSIS — K219 Gastro-esophageal reflux disease without esophagitis: Secondary | ICD-10-CM

## 2022-10-17 MED ORDER — CYANOCOBALAMIN 1000 MCG/ML IJ SOLN
1000.0000 ug | Freq: Once | INTRAMUSCULAR | Status: AC
  Administered 2022-10-17: 1000 ug via INTRAMUSCULAR

## 2022-10-17 NOTE — Progress Notes (Signed)
Subjective:     Patient ID: [name], female    DOB: 1951/11/14, 71 y.o.   MRN: [id number]  Chief Complaint  Patient presents with   Follow-up    2 month follow-up Want B 12 injection    HPI  B12 def so here for injection DM type 2 w/ckd-A1C at goal. Not on meds-intol.  Saw ophth-dx w/macular degen-referred to retina-appt in 1-2 wks.  Stressed all the time so poor diet.  Gaining wt.   Gerd-mostly ok on pantaprazole.  Sees GI. Now on 27m as "problems w/460m Edema-bumex causing chills, GI upset, cp(saw Card)-resolved off bumex.  So back on Lasix-no rash yet and not working as well.  No inc sob.   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 name of MD    Dementia ([facility]   Diabetes mellitus    type 2    Diverticulosis    Dyslipidemia    Dyspnea    with exertion    Dysrhythmia    "skips beats"   [facility] [place]   Family history of adverse reaction to anesthesia    son stopped breathing during surgery , mother slow to wake up    Fibromyalgia 1980's   Generalized anxiety disorder    GERD (gastroesophageal reflux disease)    occ- will use Tums and Prolosec  prn    Heart murmur    Hyperlipemia    Hypertension    not on blood pressure meds due to allergies - last dose of meds- months ago    Hypothyroidism    Hypothyroidism (acquired)    Internal hemorrhoids    Irritable bowel syndrome 1980's   [facility]   Major depressive disorder    Migraine headaches 1980's   On meds-well controlled   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   Morbid [place] ([facility]   Pernicious anemia    Pneumonia 2009   Several times over the past several years   PONV (postoperative nausea and vomiting)    was told by neurologist due to calcificaton in brain not to be put to sleep, N/V during Op and Recovery    Recurrent upper respiratory infection (URI)    Sleep apnea    no cpap   [facility]noid hemorrhage [facility]   Vaginal polyp    benign per pt    Past Surgical History:  Procedure Laterality Date   CATARACT EXTRACTION Right    CHOLECYSTECTOMY  1983   COLONOSCOPY  2008   DB   DILATION AND CURETTAGE OF UTERUS  1972   HYSTEROSCOPY WITH D & C  06/29/2011   Procedure: DILATATION AND CURETTAGE (D&C) /HYSTEROSCOPY;  Surgeon: TaDonnamae JudeMD;  [facility];  Service: Gynecology;  Laterality: N/A;   ROBOTIC ASSISTED TOTAL HYSTERECTOMY WITH BILATERAL SALPINGO OOPHERECTOMY N/A 10/27/2020   Procedure: XI ROBOTIC ASSISTED TOTAL HYSTERECTOMY WITH BILATERAL SALPINGO OOPHORECTOMY;  Surgeon: RoEveritt Amber  MD;  Location: [facility];  Service: Gynecology;  Laterality: N/A;   SENTINEL NODE BIOPSY N/A 10/27/2020   Procedure: SENTINEL NODE BIOPSY;  Surgeon: Rossi, Emma, MD;  Location: [facility];  Service: Gynecology;  Laterality: N/A;    Outpatient Medications Prior to Visit  Medication Sig Dispense Refill   acetaminophen (TYLENOL) 500 MG tablet Take 500 mg by mouth every 6 (six) hours as needed for moderate pain.     aspirin EC 325 MG tablet Take 1 tablet (325 mg total) by mouth daily. 90 tablet 3   Blood Glucose Monitoring Suppl (ACCU-CHEK GUIDE) w/Device KIT Use to test blood sugar 3 times daily. 1 kit 0   Cholecalciferol 25 MCG (1000 UT) capsule Take 1 capsule (1,000 Units total) by mouth daily. 90 capsule 3   cyanocobalamin (,VITAMIN B-12,) 1000 MCG/ML injection Inject 1,000 mcg into the muscle every 30 (thirty) days.      diphenoxylate-atropine (LOMOTIL) 2.5-0.025 MG tablet TAKE 1 TABLET BY MOUTH TWICE DAILY AS NEEDED FOR  DIARRHEA OR  LOOSE  STOOLS 60 tablet 2   estradiol (ESTRACE) 0.1 MG/GM vaginal cream Place 0.5 g vaginally 2 (two) times a week. Place 0.5g nightly for two weeks then twice a week after 30 g 11   Evolocumab (REPATHA SURECLICK) 140 MG/ML SOAJ INJECT 1 PEN SUBCUTANEOUSLY EVERY 14 DAYS 6 mL 3   gabapentin (NEURONTIN) 100 MG capsule Take 1 capsule (100 mg total) by mouth at bedtime. 90 capsule 1   glucose blood (ACCU-CHEK GUIDE) test strip Use to test blood sugar 3 times daily 100 each 2   Lancets Misc. (ACCU-CHEK FASTCLIX LANCET) KIT Use to check blood sugar 3 times daily 1 kit 0   levothyroxine (SYNTHROID) 50 MCG tablet Take 1 tablet (50 mcg total) by mouth daily. 90 tablet 3   meclizine (ANTIVERT) 12.5 MG tablet Take 1 tablet (12.5 mg total) by mouth 3 (three) times daily as needed for dizziness. 30 tablet 0   Multiple Vitamin (MULTIVITAMIN WITH MINERALS) TABS tablet Take 1 tablet by mouth daily. 30 tablet 0   ondansetron (ZOFRAN) 4 MG tablet Take 1 tablet (4 mg total) by mouth every 8 (eight) hours as needed for nausea or vomiting. 20 tablet 0   pantoprazole (PROTONIX) 20 MG tablet Take 1 tablet (20 mg total) by mouth daily. 90 tablet 3   polyethylene glycol (MIRALAX / GLYCOLAX) 17 g packet Take 17 g by mouth daily as needed for mild constipation. 14 each 0   potassium chloride SA (KLOR-CON M) 20 MEQ tablet Take 1 tablet (20 mEq total) by mouth daily. 90 tablet 0   bumetanide (BUMEX) 2 MG tablet Take 1 tablet (2 mg total) by mouth every morning. 90 tablet 1   No facility-administered medications prior to visit.    Allergies  Allergen Reactions   Atenolol     Stomach problems, chills   Atorvastatin     Other reaction(s): retain fld   Canagliflozin     ALL DIABETIC MEDS PER PT   abdominal pain, headache   Losartan Potassium     chills, HA, stomach pain   Acarbose Nausea Only     headache,nausea   Cinnamon     Stomach pain   Empagliflozin      headaches   Glimepiride     ALL DIABETIC  MEDS PER PT  abdominal pain, headache   Metformin And Related Nausea And Vomiting and Other (See Comments)    Patient states that she has severe chills, headache and   cramping additionally. Says blood sugar is uncontrolled because of this   Other Other (See Comments)    ALL DIABETIC MEDS PER PT    Pioglitazone Other (See Comments)    ALL DIABETIC MEDS PER PT  Other reaction(s): peripheral edema   Pravastatin     Other reaction(s): retain fld   Sitagliptin     ALL DIABETIC MEDS PER PT  Other reaction(s): headache   Spironolactone-Hctz Other (See Comments)   Statins     Myalgia   Sulfamethoxazole Other (See Comments)    ALL DIABETIC MEDS PER PT    Tramadol Hcl Itching   Acarbose Other (See Comments)   Atenolol Other (See Comments)   Atorvastatin Other (See Comments)   Atorvastatin Calcium     Other reaction(s): retain fld   Canagliflozin Other (See Comments)   Crestor [Rosuvastatin] Other (See Comments)   Empagliflozin Other (See Comments)   Glimepiride Other (See Comments)   Losartan Potassium Other (See Comments)   Metformin Other (See Comments)   Other Other (See Comments)   Pioglitazone Other (See Comments)   Pravastatin Other (See Comments)   Rosuvastatin Calcium    Spironolactone-Hctz Other (See Comments)   Tramadol    Lasix [Furosemide] Rash    Sugars increased   Sulfa Antibiotics Rash and Other (See Comments)    Mother has told patient in the past not to take, she cannot recall there reaction   ROS neg/noncontributory except as noted HPI/below Short term memory terrible.  Had appt that she needed to change w/neuro.  Calling family wrong names.  Has to think about directions now.  Cognitive impairment      Objective:     BP 124/72   Pulse 97   Temp 98 F (36.7 C) (Temporal)   Ht 5' 2" (1.575 m)   Wt 189 lb (85.7 kg)   SpO2 99%   BMI 34.57 kg/m  Wt Readings from Last 3 Encounters:  10/17/22 189 lb (85.7 kg)  08/24/22 178 lb 6.6 oz (80.9 kg)  08/21/22  182 lb 6 oz (82.7 kg)    Physical Exam   Gen: WDWN NAD HEENT: NCAT, conjunctiva not injected, sclera nonicteric NECK:  supple, no thyromegaly, no nodes, no carotid bruits CARDIAC: RRR, S1S2+, 1/6 murmur. DP 1+B LUNGS: CTAB. No wheezes ABDOMEN:  BS+, soft, NTND, No HSM, no masses EXT:  1+edema MSK: cane NEURO: A&O x3.  CN II-XII intact.  PSYCH: normal mood. Good eye contact. Teared up at times     Assessment & Plan:   Problem List Items Addressed This Visit       Endocrine   Type 2 diabetes mellitus with stage 3 chronic kidney disease, without long-term current use of insulin (HCC)   Controlled type 2 diabetes mellitus with hyperglycemia, without long-term current use of insulin (HCC) - Primary     Other   B12 deficiency   Local edema   Other Visit Diagnoses     Gastroesophageal reflux disease without esophagitis       Cognitive impairment         1.  Type 2 diabetes with stage IIIa CKD-chronic.  Intolerant of meds.  A1c is at goal.  Patient continues to struggle with diet.  Has limited exercise.  She is seeing nephrology.  Continue to monitor. 2.  Vitamin B12 deficiency-currently getting B12 injections monthly.  Given today.  Continue monthly injections. 3.  GERD-chronic.  Stable on pantoprazole.  Continue.  Followed by GI. 4.  Localized edema-chronic.    Patient has intolerances to multiple medications.  She will start 1, go to another, and do back at the original.  Currently, states that Bumex caused a lot of GI problems but worked very well on the edema.  She is back on the Lasix even though at one point told me she had a rash.  So far no rash.  However, the Lasix does not work as well as to Bumex.  Advised to try the Bumex once or twice a week as it controlled much better, and use the Lasix daily but be very cautious of rash.  Keep us informed of progress, meds, other problems. 5.  Cognitive impairment-worsening.  Advised to keep good notes.  I have a lot of concerns about  patient-she is power of attorney for her grown daughter.  Has a lot of stressors in her life.  While those may contribute to memory loss, she also has chronic underlying memory loss.  Advised to follow-up with neurology  Meds ordered this encounter  Medications   cyanocobalamin (VITAMIN B12) injection 1,000 mcg     M , MD  

## 2022-10-17 NOTE — Patient Instructions (Addendum)
Bumex once or twice/wk Continue Lasix-but if rash, let me know.  Let me know dose of lasix-should be '40mg'$  daily  See neurologist

## 2022-10-22 ENCOUNTER — Encounter: Payer: Self-pay | Admitting: Physical Medicine and Rehabilitation

## 2022-10-22 ENCOUNTER — Encounter
Payer: Medicare Other | Attending: Physical Medicine and Rehabilitation | Admitting: Physical Medicine and Rehabilitation

## 2022-10-22 VITALS — BP 141/84 | HR 94 | Ht 62.0 in | Wt 186.0 lb

## 2022-10-22 DIAGNOSIS — R42 Dizziness and giddiness: Secondary | ICD-10-CM | POA: Diagnosis present

## 2022-10-22 NOTE — Patient Instructions (Addendum)
Insomnia: -Try to go outside near sunrise -Get exercise during the day.  -Turn off all devices an hour before bedtime.  -Teas that can benefit: chamomile, valerian root, Brahmi (Bacopa) -Can consider over the counter melatonin, magnesium, and/or L-theanine. Melatonin is an anti-oxidant with multiple health benefits. Magnesium is involved in greater than 300 enzymatic reactions in the body and most of [name] are deficient as our soil is often depleted. There are 7 different types of magnesium- Bioptemizer's is a supplement with all 7 types, and each has unique benefits. Magnesium can also help with constipation and anxiety.  -Pistachios naturally increase the production of melatonin -[employer]zy [employer] bamboo bed sheets are free from toxic chemicals.  -Tart cherry juice or a tart cherry supplement can improve sleep and soreness post-workout   [place] Fish Oil Algal Oil

## 2022-10-22 NOTE — Progress Notes (Signed)
Subjective:    Patient ID: [name], female    DOB: January 23, 1951, 71 y.o.   MRN: [id number]  HPI: [name] is a 71 y.o. female who returns for f/u appointment of her T7 Vertebral Fracture, Essential Hypertension, Neuropathic Pain and Dizziness.  She presented to [facility] on 01/30/2022 with complaints of pain after a fall, she was transferred to [facility].   Dr Lorin Mercy: H&P HPI: [name] is a 71 y.o. female with medical history significant of DM, HTN, dementia, and pernicious anemia presenting with a fall.  She has been sick for a long time.  She went off amitriptyline for migraines and was 50% less dizzy and so wasn't falling TID.  She took some brain herbal for 5 days and it started making her dizzy (less than amitriptyline).  She came into the house and tripped.   She tried to break her fall with her hand.  Both hands are hurting severely.  She hasn't been able to sleep off the amitriptyline.  She is hurting in her shoulders.  She saw a neurosurgeon 8 months ago and "a compressed something" and there was a question about FARS.  She has Parkinson's-type symptoms.  She is having tremors at baseline.  She thinks she is not a candidate for surgery - elderly, anesthesia complications, calcifications in her brain.  She has decided she wants to see someone in [place].  She just found out that she has macular degeneration She falls from dizziness She has had dizziness for a long time.  She has severe calcifications in the brain She has a lot of allergies to medications and dizziness.    CT Head: WO Contrast: CT Cervical Spine IMPRESSION: 1. No acute intracranial abnormality. 2. Small right frontal scalp hematoma without skull fracture. 3. No acute fracture or static subluxation of the cervical spine.  CT Thoracic Spine IMPRESSION: 1. No acute fracture or static subluxation of the thoracic spine. 2. Chronic compression deformity of L1.   Aortic  Atherosclerosis (ICD10-I70.0).    MR Cervical Spine IMPRESSION: 1. Mild T7 compression fracture with a small amount of bone marrow edema, compatible with acute to subacute injury. Minimal height loss. 2. Multilevel degenerative disc disease of the cervical spine with moderate C5-6 and mild C4-5 spinal canal stenosis. 3. Severe bilateral neural foraminal stenosis at C5-6.   MR Thoracic Spine:  IMPRESSION: 1. Mild T7 compression fracture with a small amount of bone marrow edema, compatible with acute to subacute injury. Minimal height loss. 2. Multilevel degenerative disc disease of the cervical spine with moderate C5-6 and mild C4-5 spinal canal stenosis. 3. Severe bilateral neural foraminal stenosis at C5-6.   DG: Left Hand   IMPRESSION: 1. Widening of the Scapholunate interval compatible with acute or chronic ligamentous injury which can predispose to SLAC wrist. 2. No acute fracture or dislocation identified about the left hand. 3. 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 4. Calcified peripheral vascular disease.  [name] was admitted to inpatient rehabilitation on 02/07/2022 and discharged home on 02/01/24/2022. She will be attending outpatient therapy.  She states she has pain in her neck radiating into her bilateral shoulders, left arm with tingling and lower back pain. She is walking with her walker.    She is walking 4,000 steps per day -she stopped the amitriptyline for migraines- no one told her to. This improved the dizziness by 50%. -she has had falls -does not not fele the room is  spinning -she feels dizzy and unbalanced.  -she uses her cane -usually does not use the walker -insomnia has improved   Pain Inventory Average Pain 5 Pain Right Now 2 My pain is intermittent, burning, and dull  In the last 24 hours, has pain interfered with the following? General activity 2 Relation with others 10 Enjoyment of life 10 What TIME of day is your  pain at its worst? morning , daytime, evening, night, and varies Sleep (in general) Poor  Pain is worse with: some activites Pain improves with:  nothing Relief from Meds0    Family History  Problem Relation Age of Onset   Anesthesia problems Mother        hard to wake post op    Cerebrovascular Disease Mother        Never had stroke, but had CEA.   Liver cancer Father        mets to liver    Bladder Cancer Father    Colon cancer Father        mets to colon    Schizophrenia Daughter        Schizoaffective disorder   Anesthesia problems Son        stopped breathing post op    Tremor Son    Anesthesia problems Granddaughter        PONV   High blood pressure Other    Diabetes Other    Arthritis Other    Schizophrenia Child    Tremor Child    Dementia Neg Hx    Esophageal cancer Neg Hx    Rectal cancer Neg Hx    Stomach cancer Neg Hx    Colon polyps Neg Hx    Social History   Socioeconomic History   Marital status: Legally Separated    Spouse name: Not on file   Number of children: 3   Years of education: 12   Highest education level: Not on file  Occupational History   Occupation: Retired     Comment: nanny   Occupation: retired  Tobacco Use   Smoking status: Never   Smokeless tobacco: Never  Scientific laboratory technician Use: Never used  Substance and Sexual Activity   Alcohol use: Never    Comment: quit 1978   Drug use: Never   Sexual activity: Not Currently  Other Topics Concern   Not on file  Social History Narrative    Lives at home. Her granddaughter lives with her and daughter    Right handed   Social Determinants of Health   Financial Resource Strain: Not on file  Food Insecurity: Not on file  Transportation Needs: Not on file  Physical Activity: Not on file  Stress: Not on file  Social Connections: Not on file   Past Surgical History:  Procedure Laterality Date   CATARACT EXTRACTION Right    CHOLECYSTECTOMY  1983   COLONOSCOPY  2008   [place] D & C  06/29/2011   Procedure: DILATATION AND CURETTAGE (D&C) /HYSTEROSCOPY;  Surgeon: Donnamae Jude, MD;  Location: [facility];  Service: Gynecology;  Laterality: N/A;   ROBOTIC ASSISTED TOTAL HYSTERECTOMY WITH BILATERAL SALPINGO OOPHERECTOMY N/A 10/27/2020   Procedure: XI ROBOTIC ASSISTED TOTAL HYSTERECTOMY WITH BILATERAL SALPINGO OOPHORECTOMY;  Surgeon: Everitt Amber, MD;  Location: [facility];  Service: Gynecology;  Laterality: N/A;   SENTINEL NODE BIOPSY N/A 10/27/2020   Procedure: SENTINEL NODE BIOPSY;  Surgeon: Denman George,  Terrence Dupont, MD;  Location: [facility];  Service: Gynecology;  Laterality: N/A;   Past Medical History:  Diagnosis Date   Allergy    Anemia    Arthritis    Asthma    Dianosed years ago-no meds at this time   Cancer [facility]) 2021   endometrial    Cataract    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 name of MD    Dementia ([place])    Diabetes mellitus    type 2    Diverticulosis    Dyslipidemia    Dyspnea    with exertion    Dysrhythmia    "skips beats"   Endometrial cancer ([place]) 2021   Family history of adverse reaction to anesthesia    son stopped breathing during surgery , mother slow to wake up    Fibromyalgia 1980's   Generalized anxiety disorder    GERD (gastroesophageal reflux disease)    occ- will use Tums and Prolosec  prn    Heart murmur    Hyperlipemia    Hypertension    not on blood pressure meds due to allergies - last dose of meds- months ago    Hypothyroidism    Hypothyroidism (acquired)    Internal hemorrhoids    Irritable bowel syndrome 1980's   Liver cancer ([place])    Major depressive disorder    Migraine headaches 1980's   On meds-well controlled   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  Opioid Risk Score:   Fall Risk Score:  `1  Depression screen PHQ 2/9     07/24/2022    1:30 PM 03/05/2022    1:33 PM 09/21/2020    8:23 AM 09/01/2020    8:47 AM  Depression screen PHQ 2/9  Decreased Interest 0 1 1 0  Down, Depressed, Hopeless 0 0 1 0  PHQ - 2 Score 0 1 2 0  Altered sleeping  3 1 0  Tired, decreased energy  2 1 0  Change in appetite  0 0 0  Feeling bad or failure about yourself   0 0 0  Trouble concentrating  0 0 0  Moving slowly or fidgety/restless  0 0 0  Suicidal thoughts  0 0 0  PHQ-9 Score  6 4 0  Difficult doing work/chores  Not difficult at all       Review of Systems  Constitutional:  Positive for chills.  HENT: Negative.    Eyes: Negative.   Respiratory: Negative.    Cardiovascular: Negative.   Gastrointestinal:  Positive for diarrhea.  Endocrine: Negative.   Genitourinary:  Positive for urgency.  Musculoskeletal:  Positive for arthralgias, back pain, gait problem and neck pain.  Skin: Negative.   Allergic/Immunologic: Negative.   Neurological:  Positive for dizziness, tremors, weakness and numbness.  Hematological: Negative.   Psychiatric/Behavioral:  The patient is nervous/anxious.       Objective:   Physical Exam Vitals and nursing note reviewed. BMI 34.02, weight 186 lbs Constitutional:      Appearance: Normal appearance.  Cardiovascular:     Rate and Rhythm: Normal rate and regular rhythm.     Pulses: Normal pulses.     Heart sounds: Normal heart sounds.  Pulmonary:     Effort: Pulmonary effort is normal.     Breath sounds: Normal breath sounds.  Musculoskeletal:      Cervical back: Normal range of motion and neck supple.     Comments: Normal Muscle Bulk and Muscle Testing Reveals:  Upper Extremities: Full ROM and Muscle Strength 5/5 Lower Extremities: Full ROM and Muscle Strength 5/5 Arises from Table Slowly using walker for support Narrow Based  Gait  Ambulating with gait    Skin:    General: Skin is warm and dry.  Neurological:     General: No focal deficit present.     Mental Status: She is alert and oriented to person, place, and time.  Psychiatric:        Mood and Affect: Mood normal.        Behavior: Behavior normal.        Assessment & Plan:   1. T7 Vertebral Fracture: F/U with Dr. Kathyrn Sheriff. Continue outpatient therapy.  2.  Essential Hypertension,: Continue current medication regimen. PCP Following.   3.  Neuropathic Pain: Continue current medication regimen. Continue to monitor  4. Dizziness. PCP following. Continue to monitor. Continue to Monitor. Commended on stopping amitriptyline and gabapentin. Encouraged continued hydration.  -discussed vestibular eval -discussed that her brain calcifications could contribute Discussed extracorporeal shockwave therapy as a modality for treatment. Discussed that the device looks and feels like a massage gun and I would move it over the area of pain for about 10 minutes. The device releases sound waves to the area of pain and helps to improve blood flow and circulation to improve the healing process. Discuss that this initially induces inflammation and can sometimes cause short-term increase in pain. Discussed that we typically do three weekly treatments, but sometimes up to 6 if needed, and after 6 weeks long term benefits can sometimes be achieved. Discussed that this is an FDA approved device, but not covered by insurance and would cost $60 per session. Will scheduled patient for 6 consecutive appointments and can cancel latter three if benefits are achieved after first three sessions.    5.  Diarrhea: discussed how this had complicated her PT  6. Migraines: discussed her prior use of amitriptyline and gabapentin and how she stopped these due to her dizziness. Continue magnesium supplement -discussed Botox, Nurtec  7. Constipation:  -continue magnesium supplement.  -Provided list of following foods that help with constipation and highlighted a few: 1) prunes- contain high amounts of fiber.  2) apples- has a form of dietary fiber called pectin that accelerates stool movement and increases beneficial gut bacteria 3) pears- in addition to fiber, also high in fructose and sorbitol which have laxative effect 4) figs- contain an enzyme ficin which helps to speed colonic transit 5) kiwis- contain an enzyme actinidin that improves gut motility and reduces constipation 6) oranges- rich in pectin (like apples) 7) grapefruits- contain a flavanol naringenin which has a laxative effect 8) vegetables- rich in fiber and also great sources of folate, vitamin C, and K 9) artichoke- high in inulin, prebiotic great for the microbiome  10) chicory- increases stool frequency and softness (can be added to coffee) 11) rhubarb- laxative effect 12) sweet potato- high fiber 13) beans, peas, and lentils- contain both soluble and insoluble fiber 14) chia seeds- improves intestinal health and gut flora 15) flaxseeds- laxative effect 16) whole grain rye bread- high in fiber 17) oat bran- high in soluble and insoluble fiber 18) kefir- softens stools -recommended to try at least one of these foods every day.  -drink 6-8 glasses of water per day -walk regularly, especially after meals.   8) Insomnia: -Try to go outside near sunrise -Get exercise during the day.  -Turn off all devices an hour before bedtime.  -Teas that can benefit: chamomile, valerian root, Brahmi (Bacopa) -Can consider over the counter melatonin, magnesium, and/or L-theanine. Melatonin is an anti-oxidant with multiple health benefits.  Magnesium is involved in greater than 300 enzymatic reactions in the body and most of [name] are deficient as our soil is often depleted. There are 7 different types of magnesium- Bioptemizer's is a supplement with all 7 types, and each has unique benefits. Magnesium can also help with constipation and anxiety.  -Pistachios naturally increase the production of melatonin -Cozy Earth bamboo bed sheets are free from toxic chemicals.  -Tart cherry juice or a tart cherry supplement can improve sleep and soreness post-workout    9) HLD -continue Repatha

## 2022-10-23 ENCOUNTER — Encounter: Payer: Self-pay | Admitting: Obstetrics & Gynecology

## 2022-10-24 ENCOUNTER — Inpatient Hospital Stay: Payer: Medicare Other | Attending: Obstetrics & Gynecology | Admitting: Obstetrics & Gynecology

## 2022-10-24 ENCOUNTER — Encounter: Payer: Self-pay | Admitting: Obstetrics & Gynecology

## 2022-10-24 VITALS — BP 146/84 | HR 96 | Temp 98.1°F | Resp 20 | Ht 62.0 in | Wt 182.0 lb

## 2022-10-24 DIAGNOSIS — Z8541 Personal history of malignant neoplasm of cervix uteri: Secondary | ICD-10-CM | POA: Diagnosis not present

## 2022-10-24 DIAGNOSIS — C541 Malignant neoplasm of endometrium: Secondary | ICD-10-CM

## 2022-10-24 DIAGNOSIS — Z8542 Personal history of malignant neoplasm of other parts of uterus: Secondary | ICD-10-CM | POA: Diagnosis not present

## 2022-10-24 NOTE — Patient Instructions (Signed)
Return in 6 monthsHealthy vulval hygiene practices Avoid Substitute  Clothing  Pantyhose Stockings with a garter belt Thigh-high or knee-high stockings   Synthetic underwear Cotton underwear or no underwear  Jeans and other tight pants Loose pants, skirts, dresses  Swimsuits, leotards, thongs, lycra garments Loose-fitting cotton garments  Cleansing products  Scented soaps or shampoos Fragrance-free pH neutral soap  Bubble bath Tub baths in the morning and at night without additives and at a comfortable temperature  Scented detergents Unscented detergents  Baby wipes or flushable wipes Rinse with water using sports water bottle or perineal irrigation bottle  Feminine sprays, douches, powders These are not necessary products and can be omitted from personal practices  Other  Washcloths Use fingertips for washing; pat dry, do not rub dry  Panty liners Tampons or cotton pads  Dyed toilet articles Toilet articles without dyes  Hair dryers to dry vulva skin without contact Dry vulva by gentle patting  Vulvar/Vaginal Moisturizers  Moisturizer Options: Vitamin E oil: pump or capsule form Vitamin E cream (Gene's vitamin E cream) Coconut oil: bottle or bead form Shea butter Blossom Organic Lubricant (organic and all natural; www.blossomorganics.com) PE suppository(coconut oil/vitamin E/palm oil) [place] [employer]vest Aloe Glide      Consider the ingredients of the product - the fewer the ingredients the better!  Directions for Use: Clean and dry your hands Gently dab the vulvar/vaginal area dry as needed Apply a "pea-sized" amount of the moisturizer onto your fingertip Using you other hand, open the labia   Apply the moisturizer to the vulvar/vaginal tissues Wear loose fitting underwear/clothing if possible following application  Use moisturize 2-3 times daily as desired.

## 2022-10-24 NOTE — Assessment & Plan Note (Addendum)
[name]  is a 71 y.o.  year old P3 with stage IA grade 1 endometrial cancer, MMR deficient, (hypermethylation present), s/p surgical staging on 10/27/20 .  No evidence of recurrence   Advanced stage anterior wall defect, CUR sxs.  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 >encouraged f/u w/UROGYN >optimize hygiene practices; counseled re: moisturizers--education materials provided

## 2022-10-24 NOTE — Progress Notes (Signed)
Follow Up Note: Gyn-Onc  [name] 71 y.o. female  CC: She presents for a follow-up visit   HPI: The oncology history was reviewed.  Interval History: At the last visit she complained of vaginal bleeding.  A lesion at the vaginal cuff was biopsied.  The histology was benign.  No further episodes of bleeding. Interim comorbid issues--she sustained a fall complicated by a back injury, diagnosed w/macular degeneration.  C/O pain, diarrhea, urinary frequency/urgency. Recently diagnosed w/vaginitis.      Review of Systems  Review of Systems  Constitutional:  Negative for malaise/fatigue and weight loss.  Respiratory:  Negative for shortness of breath and wheezing.   Cardiovascular:  Negative for chest pain and leg swelling.  Gastrointestinal:  Negative for abdominal pain, blood in stool, constipation, nausea and vomiting.  Genitourinary:  Negative for dysuria, frequency, hematuria and urgency.  Musculoskeletal:  Negative for joint pain and myalgias.  Neurological:  Negative for weakness.  Psychiatric/Behavioral:  Negative for depression. The patient does not have insomnia.    Current medications, allergy, social history, past surgical history, past medical history, family history were all reviewed.    Vitals:  BP (!) 146/84 Comment: MD notified  Pulse 96   Temp 98.1 F (36.7 C) (Oral)   Resp 20   Ht _0  (1.575 m)   Wt 182 lb (82.6 kg)   BMI 33.29 kg/m   Physical Exam:  Physical Exam Exam conducted with a chaperone present.  Constitutional:      General: She is not in acute distress. Cardiovascular:     Rate and Rhythm: Normal rate and regular rhythm.  Pulmonary:     Effort: Pulmonary effort is normal.     Breath sounds: Normal breath sounds. No wheezing or rhonchi.  Abdominal:     Palpations: Abdomen is soft.     Tenderness: There is no abdominal tenderness. There is no right CVA tenderness or left CVA tenderness.     Hernia: No hernia is present.   Genitourinary:    General: Normal vulva.     Urethra: No urethral lesion.     Vagina: No lesions. No bleeding.  Large cystocele Musculoskeletal:     Cervical back: Neck supple.     Right lower leg: No edema.     Left lower leg: No edema.  Lymphadenopathy:     Upper Body:     Right upper body: No supraclavicular adenopathy.     Left upper body: No supraclavicular adenopathy.     Lower Body: No right inguinal adenopathy. No left inguinal adenopathy.  Skin:    Findings: No rash.  Neurological:     Mental Status: She is oriented to person, place, and time.   Assessment/Plan:  Endometrial cancer ([place]) [name]  is a 71 y.o.  year old P3 with stage IA grade 1 endometrial cancer, MMR deficient, (hypermethylation present), s/p surgical staging on 10/27/20 .  No evidence of recurrence   Advanced stage anterior wall defect, CUR sxs.  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 >encouraged f/u w/UROGYN >optimize hygiene practices; counseled re: moisturizers--education materials provided   I personally spent 25 minutes face-to-face and non-face-to-face in the care of this patient, which includes all pre, intra, and post visit time on the date of service.   Lahoma Crocker, MD

## 2022-10-29 ENCOUNTER — Encounter: Payer: Self-pay | Admitting: Obstetrics and Gynecology

## 2022-10-29 ENCOUNTER — Ambulatory Visit (INDEPENDENT_AMBULATORY_CARE_PROVIDER_SITE_OTHER): Payer: Medicare Other | Admitting: Obstetrics and Gynecology

## 2022-10-29 VITALS — BP 150/83 | HR 93 | Ht 61.5 in | Wt 181.0 lb

## 2022-10-29 DIAGNOSIS — N3941 Urge incontinence: Secondary | ICD-10-CM

## 2022-10-29 DIAGNOSIS — R159 Full incontinence of feces: Secondary | ICD-10-CM

## 2022-10-29 DIAGNOSIS — N8110 Cystocele, unspecified: Secondary | ICD-10-CM

## 2022-10-29 DIAGNOSIS — N993 Prolapse of vaginal vault after hysterectomy: Secondary | ICD-10-CM

## 2022-10-29 DIAGNOSIS — R350 Frequency of micturition: Secondary | ICD-10-CM

## 2022-10-29 LAB — POCT URINALYSIS DIPSTICK
Bilirubin, UA: NEGATIVE
Blood, UA: NEGATIVE
Glucose, UA: NEGATIVE
Ketones, UA: NEGATIVE
Nitrite, UA: NEGATIVE
Protein, UA: POSITIVE — AB
Spec Grav, UA: 1.02 (ref 1.010–1.025)
Urobilinogen, UA: 0.2 E.U./dL
pH, UA: 7 (ref 5.0–8.0)

## 2022-10-29 NOTE — Progress Notes (Signed)
[facility] New Patient Evaluation and Consultation  Referring Provider: Truett Mainland, DO PCP: Isabella Crook, MD Date of Service: 10/29/2022  SUBJECTIVE Chief Complaint: New Patient (Initial Visit) ([name] is a 71 y.o. female here for a consult for prolapse./)  History of Present Illness: [name] is a 71 y.o. White or Caucasian female seen in consultation at the request of Dr. Nehemiah Settle for evaluation of prolapse.    Review of records significant for: Has history of stage 1A grade 1 endometrial cancer s/p surgical staging on 10/2020. Cystocele noted on exam.   Last Hgb A1c on 08/21/22 was 6.9%  Urinary Symptoms: Leaks urine with without sensation Leaks 3-10 time(s) per day.  Pad use:  pads  She is bothered by her UI symptoms.  Day time voids- every few hours.  Nocturia: 3 times per night to void. Voiding dysfunction: she empties her bladder well.  When urinating, she feels difficulty starting urine stream Drinks: 2 cups coffee in AM, 2 glasses diet fresca, lots of water (unable to quantify) per day  UTIs:  0  UTI's in the last year.   Denies history of blood in urine and kidney or bladder stones  Pelvic Organ Prolapse Symptoms:                  She Admits to a feeling of a bulge the vaginal area. It has been present for about a year.  She Admits to seeing a bulge.  This bulge is bothersome.  Bowel Symptom: Bowel movements: small BM once a day (with the magnesium) Stool consistency: soft  Straining: no.  Splinting: no.  Incomplete evacuation: no.  She Admits to accidental bowel leakage / fecal incontinence  Occurs: with diarrhea  Consistency with leakage: loose Bowel regimen: magnesium, miralax did not work for her. Previously was using benefiber.  Last colonoscopy: Date 2019  Sexual Function Sexually active: no.    Pelvic Pain Denies pelvic pain   Past Medical History:  Past Medical History:  Diagnosis Date   Allergy     Anemia    Arthritis    Asthma    Dianosed years ago-no meds at this time   Cancer [facility]) 2021   endometrial    Cataract    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 name of MD    Dementia ([place])    Diabetes mellitus    type 2    Diverticulosis    Dyslipidemia    Dyspnea    with exertion    Dysrhythmia    "skips beats"   Endometrial cancer ([place]) 2021   Family history of adverse reaction to anesthesia    son stopped breathing during surgery , mother slow to wake up    Fibromyalgia 1980's   Generalized anxiety disorder    GERD (gastroesophageal reflux disease)    occ- will use Tums and Prolosec  prn    Heart murmur    Hyperlipemia    Hypertension    not on blood pressure meds due to allergies - last dose of meds- months ago    Hypothyroidism    Hypothyroidism (acquired)    Internal hemorrhoids    Irritable bowel syndrome 1980's   Liver cancer ([place])    Major depressive disorder    Migraine headaches 1980's   On meds-well controlled  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   Morbid obesity ([place])    Pernicious anemia    Pneumonia 2009   Several times over the past several years   PONV (postoperative nausea and vomiting)    was told by neurologist due to calcificaton in brain not to be put to sleep, N/V during Op and Recovery    Recurrent upper respiratory infection (URI)    Sleep apnea    no cpap   Subarachnoid hemorrhage ([place]) 2010   Vaginal polyp    benign per pt     Past Surgical History:   Past Surgical History:  Procedure Laterality Date   CATARACT EXTRACTION Right    CHOLECYSTECTOMY  1983   COLONOSCOPY  2008   DB   DILATION AND CURETTAGE OF UTERUS  1972   HYSTEROSCOPY WITH D & C  06/29/2011   Procedure: DILATATION AND CURETTAGE (D&C) /HYSTEROSCOPY;  Surgeon: Donnamae Jude, MD;  Location: [facility];  Service: Gynecology;   Laterality: N/A;   ROBOTIC ASSISTED TOTAL HYSTERECTOMY WITH BILATERAL SALPINGO OOPHERECTOMY N/A 10/27/2020   Procedure: XI ROBOTIC ASSISTED TOTAL HYSTERECTOMY WITH BILATERAL SALPINGO OOPHORECTOMY;  Surgeon: Everitt Amber, MD;  Location: [facility];  Service: Gynecology;  Laterality: N/A;   SENTINEL NODE BIOPSY N/A 10/27/2020   Procedure: SENTINEL NODE BIOPSY;  Surgeon: Everitt Amber, MD;  Location: [facility];  Service: Gynecology;  Laterality: N/A;     Past OB/GYN History: OB History  Gravida Para Term Preterm AB Living  '5 3 3 '$ 0 2 3  SAB IAB Ectopic Multiple Live Births  2 0 0   3    # Outcome Date GA Lbr Len/2nd Weight Sex Delivery Anes PTL Lv  5 SAB           4 SAB           3 Term           2 Term           1 Term            S/p hysterectomy for endometrial cancer 2021.    Medications: She has a current medication list which includes the following prescription(s): acetaminophen, aspirin ec, accu-chek guide, cholecalciferol, cyanocobalamin, repatha sureclick, accu-chek guide, accu-chek fastclix lancet, levothyroxine, multivitamin with minerals, pantoprazole, and potassium chloride sa.   Allergies: Patient is allergic to atenolol, atorvastatin, canagliflozin, losartan potassium, acarbose, cinnamon, empagliflozin, glimepiride, metformin and related, other, pioglitazone, pravastatin, sitagliptin, spironolactone-hctz, statins, sulfamethoxazole, tramadol hcl, acarbose, atenolol, atorvastatin, atorvastatin calcium, canagliflozin, crestor [rosuvastatin], empagliflozin, glimepiride, losartan potassium, metformin, other, pioglitazone, pravastatin, rosuvastatin calcium, spironolactone-hctz, tramadol, lasix [furosemide], and sulfa antibiotics.   Social History:  Social History   Tobacco Use   Smoking status: Never   Smokeless tobacco: Never  Vaping Use   Vaping Use: Never used  Substance Use Topics   Alcohol use: Never    Comment: quit 1978   Drug use: Never    Family History:   Family  History  Problem Relation Age of Onset   Anesthesia problems Mother        hard to wake post op    Cerebrovascular Disease Mother        Never had stroke, but had CEA.   Liver cancer Father        mets to liver    Bladder Cancer Father    Colon cancer Father        mets to colon    Schizophrenia Daughter        Schizoaffective  disorder   Anesthesia problems Son        stopped breathing post op    Tremor Son    Anesthesia problems Granddaughter        PONV   High blood pressure Other    Diabetes Other    Arthritis Other    Schizophrenia Child    Tremor Child    Dementia Neg Hx    Esophageal cancer Neg Hx    Rectal cancer Neg Hx    Stomach cancer Neg Hx    Colon polyps Neg Hx      Review of Systems: Review of Systems  Constitutional:  Positive for chills. Negative for fever, malaise/fatigue and weight loss.  Respiratory:  Negative for cough, shortness of breath and wheezing.   Cardiovascular:  Positive for leg swelling. Negative for chest pain and palpitations.  Gastrointestinal:  Negative for abdominal pain and blood in stool.  Genitourinary:  Negative for dysuria.  Musculoskeletal:  Negative for myalgias.  Skin:  Negative for rash.  Neurological:  Positive for headaches. Negative for dizziness.  Endo/Heme/Allergies:  Does not bruise/bleed easily.  Psychiatric/Behavioral:  Negative for depression. The patient is not nervous/anxious.      OBJECTIVE Physical Exam: Vitals:   10/29/22 1308  BP: (!) 150/83  Pulse: 93  Weight: 181 lb (82.1 kg)  Height: 5' 1.5" (1.562 m)    Physical Exam Constitutional:      General: She is not in acute distress. Pulmonary:     Effort: Pulmonary effort is normal.  Abdominal:     General: There is no distension.     Palpations: Abdomen is soft.     Tenderness: There is no abdominal tenderness. There is no rebound.  Musculoskeletal:        General: No swelling. Normal range of motion.  Skin:    General: Skin is warm and dry.      Findings: No rash.  Neurological:     Mental Status: She is alert and oriented to person, place, and time.  Psychiatric:        Mood and Affect: Mood normal.        Behavior: Behavior normal.      GU / Detailed Urogynecologic Evaluation:  Pelvic Exam: Normal external female genitalia; Bartholin's and Skene's glands normal in appearance; urethral meatus normal in appearance, no urethral masses or discharge.   CST: positive   s/p hysterectomy: Speculum exam reveals normal vaginal mucosa with  atrophy and normal vaginal cuff.  Adnexa no mass, fullness, tenderness.    Pelvic floor strength I/V  Pelvic floor musculature: Right levator non-tender, Right obturator non-tender, Left levator non-tender, Left obturator non-tender  POP-Q:   POP-Q  1                                            Aa   1                                           Ba  2                                              C  5                                            Gh  3.5                                            Pb  8                                            tvl   -2                                            Ap  -2                                            Bp                                                 D      Rectal Exam:  Rectal exam deferred for patient comfort due to active bowel leakage.   Post-Void Residual (PVR) by Bladder Scan: In order to evaluate bladder emptying, we discussed obtaining a postvoid residual and she agreed to this procedure.  Procedure: The ultrasound unit was placed on the patient's abdomen in the suprapubic region after the patient had voided. A PVR of 3 ml was obtained by bladder scan.  Laboratory Results: POC urine: trace leukocytes, negative nitrites   ASSESSMENT AND PLAN [name] is a 71 y.o. with:  1. Prolapse of anterior vaginal wall   2. Vaginal vault prolapse after hysterectomy   3. Urinary frequency   4. Incontinence of feces,  unspecified fecal incontinence type   5. Urge incontinence    Stage II anterior, Stage I posterior, Stage III apical prolapse - For treatment of pelvic organ prolapse, we discussed options for management including expectant management, conservative management, and surgical management, such as Kegels, a pessary, pelvic floor physical therapy, and specific surgical procedures. - She is possibly interested in a pessary. Handout provided. Overall she is not sure if she wants any intervention at this time.   2. Fecal incontinence - Treatment options include anti-diarrhea medication (loperamide/ Imodium OTC or prescription lomotil), fiber supplements, physical therapy, and possible sacral neuromodulation or surgery.   - Reviewed restarting the daily fiber supplement can help with stool bulking, which will help with bowel leakage.   3. Urge incontinence - Unclear if she has more of OAB vs SUI symptoms.  - Recommended decreasing bladder irritants- coffee and diet soda.  - Can consider starting medication if still bothersome.   She will call office for appt if she decides she wants a pessary fitting.   Jaquita Folds, MD

## 2022-10-29 NOTE — Patient Instructions (Addendum)
You have a stage 3 (out of 4) prolapse.  We discussed the fact that it is not life threatening but there are several treatment options. For treatment of pelvic organ prolapse, we discussed options for management including expectant management, conservative management, and surgical management, such as Kegels, a pessary, pelvic floor physical therapy, and specific surgical procedures.     Restart fiber supplement for stool bulking- take daily metamucil or benefiber.

## 2022-10-30 ENCOUNTER — Ambulatory Visit (INDEPENDENT_AMBULATORY_CARE_PROVIDER_SITE_OTHER): Payer: Medicare Other | Admitting: Podiatry

## 2022-10-30 ENCOUNTER — Encounter: Payer: Self-pay | Admitting: Podiatry

## 2022-10-30 DIAGNOSIS — M79674 Pain in right toe(s): Secondary | ICD-10-CM

## 2022-10-30 DIAGNOSIS — B351 Tinea unguium: Secondary | ICD-10-CM

## 2022-10-30 DIAGNOSIS — E1165 Type 2 diabetes mellitus with hyperglycemia: Secondary | ICD-10-CM

## 2022-10-30 DIAGNOSIS — M79675 Pain in left toe(s): Secondary | ICD-10-CM | POA: Diagnosis not present

## 2022-10-30 NOTE — Progress Notes (Signed)
This patient returns to my office for at risk foot care.  This patient requires this care by a professional since this patient will be at risk due to having diabetes and kidney disease. This patient is unable to cut nails herself since the patient cannot reach her nails.These nails are painful walking and wearing shoes.  This patient presents for at risk foot care today.  General Appearance  Alert, conversant and in no acute stress.  Vascular  Dorsalis pedis and posterior tibial  pulses are palpable  bilaterally.  Capillary return is within normal limits  bilaterally. Temperature is within normal limits  bilaterally.  Neurologic  Senn-Weinstein monofilament wire test within normal limits  bilaterally. Muscle power within normal limits bilaterally.  Nails Thick disfigured discolored nails with subungual debris  hallux nails bilaterally. No evidence of bacterial infection or drainage bilaterally.  Orthopedic  No limitations of motion  feet .  No crepitus or effusions noted.  No bony pathology or digital deformities noted.  HAV  B/L.  Midfoot  DJD  B/L.  Skin  normotropic skin with no porokeratosis noted bilaterally.  No signs of infections or ulcers noted.     Onychomycosis  Pain in right toes  Pain in left toes  Consent was obtained for treatment procedures.   Mechanical debridement of nails 1-5  bilaterally performed with a nail nipper.  Filed with dremel without incident.    Return office visit     9  weeks                 Told patient to return for periodic foot care and evaluation due to potential at risk complications.   Gardiner Barefoot DPM

## 2022-11-10 ENCOUNTER — Encounter (HOSPITAL_COMMUNITY): Payer: Self-pay

## 2022-11-10 ENCOUNTER — Ambulatory Visit (HOSPITAL_COMMUNITY)
Admission: EM | Admit: 2022-11-10 | Discharge: 2022-11-10 | Disposition: A | Discharge status: Home / Self Care | Payer: Medicare Other | Attending: Emergency Medicine | Admitting: Emergency Medicine

## 2022-11-10 DIAGNOSIS — U071 COVID-19: Secondary | ICD-10-CM | POA: Diagnosis not present

## 2022-11-10 MED ORDER — PAXLOVID (150/100) 10 X 150 MG & 10 X 100MG PO TBPK: 2.0000 | ORAL_TABLET | Freq: Two times a day (BID) | ORAL | 0 refills | Status: DC

## 2022-11-10 MED ORDER — PAXLOVID (150/100) 10 X 150 MG & 10 X 100MG PO TBPK: 2.0000 | ORAL_TABLET | Freq: Two times a day (BID) | ORAL | 0 refills | Status: AC

## 2022-11-10 NOTE — Discharge Instructions (Signed)
Please begin Paxlovid by taking 2 tablets every 12 hours for the next 5 days.  While this medication will not make you feel better, you will keep you from feeling worse and is also been statistically proven to significantly decrease your risk of hospitalization due to side effects caused by COVID-19.  I enclosed information about COVID-19 and self-care at home that I hope you find helpful.  Thank you for visiting urgent care today, we hope you feel better soon.

## 2022-11-10 NOTE — ED Provider Notes (Signed)
[facility]    CSN: [id number] Arrival date & time: 11/10/22  1418    HISTORY   Chief Complaint  Patient presents with   Covid Positive   HPI [name] is a pleasant, 71 y.o. female who presents to urgent care today. Patient reports testing positive for COVID-19 at home today.  Patient states that her entire family came over for Thanksgiving dinner and that everyone present tested positive for COVID-19 as well.  Patient states she had a sore throat, fever with a Tmax of 103, nausea, chills, headache, slight shortness of breath and nasal congestion.  Yesterday states she reached out to her doctor's office who was happy to provide her with a prescription for Paxlovid when she had COVID-19 2 years ago, states they refused to do it today stating that she needed to have someone listen to her lungs before she could receive a prescription.  Patient also reports a history of mild kidney disease.  EMR reviewed, last GFR was 49 in July of this year.  The history is provided by the patient.   Past Medical History:  Diagnosis Date   Allergy    Anemia    Arthritis    Asthma    Dianosed years ago-no meds at this time   Cancer [facility]) 2021   endometrial    Cataract    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 name of MD    Dementia ([place])    Diabetes mellitus    type 2    Diverticulosis    Dyslipidemia    Dyspnea    with exertion    Dysrhythmia    "skips beats"   Endometrial cancer ([place]) 2021   Family history of adverse reaction to anesthesia    son stopped breathing during surgery , mother slow to wake up    Fibromyalgia 1980's   Generalized anxiety disorder    GERD (gastroesophageal reflux disease)    occ- will use Tums and Prolosec  prn    Heart murmur    Hyperlipemia    Hypertension    not on blood pressure meds due to allergies -  last dose of meds- months ago    Hypothyroidism    Hypothyroidism (acquired)    Internal hemorrhoids    Irritable bowel syndrome 1980's   Liver cancer ([place])    Major depressive disorder    Migraine headaches 1980's   On meds-well controlled   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   Morbid obesity ([place])    Pernicious anemia    Pneumonia 2009   Several times over the past several years   PONV (postoperative nausea and vomiting)    was told by neurologist due to calcificaton in brain not to be put to sleep, N/V during Op and Recovery    Recurrent upper respiratory infection (URI)    Sleep apnea    no cpap   Subarachnoid hemorrhage ([place]) 2010   Vaginal polyp    benign per pt   Patient Active Problem List   Diagnosis Date Noted   Primary insomnia 05/06/2022   B12 deficiency 05/06/2022   Local edema 05/06/2022   Neuropathic pain    Essential hypertension    Controlled type 2 diabetes mellitus with hyperglycemia, without long-term current use of insulin ([place])    Depression    T7 vertebral fracture ([place])  02/07/2022   Malnutrition of moderate degree 02/05/2022   Closed T7 fracture ([place]) 01/31/2022   Dementia ([place]) 01/31/2022   Diabetes mellitus without complication ([place]) 44/12/270   Dyslipidemia 01/31/2022   Hypertension 01/31/2022   Hypothyroidism (acquired) 01/31/2022   Cervical stenosis of spinal canal 01/31/2022   Falls frequently 01/31/2022   Bilateral hand pain 01/31/2022   Vaginal discharge 11/26/2021   Anxiety and depression 10/30/2020   Carotid artery stenosis 10/30/2020   Facial droop 10/29/2020   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   HLD (hyperlipidemia) 10/29/2020   Endometrial cancer ([place]) 10/18/2020   CAD (coronary artery disease) 05/11/2020   Acute encephalopathy 02/26/2020   Hypothyroidism    Cerebral calcification 02/10/2020   Impairment of balance 02/10/2020   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   Essential tremor 06/24/2018   Migraine without  aura 06/24/2018   Stress at home 08/28/2015   Post-menopausal bleeding 06/16/2011   Kidney disease 06/16/2011   Colon polyp 06/16/2011   Vaginal polyp 06/16/2011   Chronic diarrhea 06/16/2011   Diverticulitis 06/16/2011   DUB (dysfunctional uterine bleeding) 06/16/2011   Fatty liver 06/16/2011   Type 2 diabetes mellitus with stage 3 chronic kidney disease, without long-term current use of insulin ([place]) 06/16/2011   Past Surgical History:  Procedure Laterality Date   CATARACT EXTRACTION Right    CHOLECYSTECTOMY  1983   COLONOSCOPY  2008   DB   DILATION AND CURETTAGE OF UTERUS  1972   HYSTEROSCOPY WITH D & C  06/29/2011   Procedure: DILATATION AND CURETTAGE (D&C) /HYSTEROSCOPY;  Surgeon: Donnamae Jude, MD;  Location: [facility];  Service: Gynecology;  Laterality: N/A;   ROBOTIC ASSISTED TOTAL HYSTERECTOMY WITH BILATERAL SALPINGO OOPHERECTOMY N/A 10/27/2020   Procedure: XI ROBOTIC ASSISTED TOTAL HYSTERECTOMY WITH BILATERAL SALPINGO OOPHORECTOMY;  Surgeon: Everitt Amber, MD;  Location: [facility];  Service: Gynecology;  Laterality: N/A;   SENTINEL NODE BIOPSY N/A 10/27/2020   Procedure: SENTINEL NODE BIOPSY;  Surgeon: Everitt Amber, MD;  Location: [facility];  Service: Gynecology;  Laterality: N/A;   OB History     Gravida  5   Para  3   Term  3   Preterm  0   AB  2   Living  3      SAB  2   IAB  0   Ectopic  0   Multiple      Live Births  3          Home Medications    Prior to Admission medications   Medication Sig Start Date End Date Taking? Authorizing Provider  acetaminophen (TYLENOL) 500 MG tablet Take 500 mg by mouth every 6 (six) hours as needed for moderate pain.   Yes [provider]  aspirin EC 325 MG tablet Take 1 tablet (325 mg total) by mouth daily. 06/04/22  Yes [name], MD  Blood Glucose Monitoring Suppl (ACCU-CHEK GUIDE) w/Device KIT Use to test blood sugar 3 times daily. 06/14/22  Yes [name], MD  Cholecalciferol 25 MCG (1000 UT)  capsule Take 1 capsule (1,000 Units total) by mouth daily. 06/04/22  Yes [name], MD  cyanocobalamin (,VITAMIN B-12,) 1000 MCG/ML injection Inject 1,000 mcg into the muscle every 30 (thirty) days.    Yes [provider]  levothyroxine (SYNTHROID) 50 MCG tablet Take 1 tablet (50 mcg total) by mouth daily. 06/04/22  Yes [name], MD  Multiple Vitamin (MULTIVITAMIN WITH MINERALS) TABS tablet Take 1 tablet by mouth daily. 02/15/22  Yes Raulkar, Clide Deutscher, MD  nirmatrelvir & ritonavir (PAXLOVID, 150/100,) 10 x 150 MG & 10 x 100MG TBPK Take 2 tablets by mouth every 12 (twelve) hours for 5 days. 11/10/22 11/15/22 Yes Lynden Oxford Scales, PA-C  pantoprazole (PROTONIX) 20 MG tablet Take 1 tablet (20 mg total) by mouth daily. 08/09/22  Yes Nandigam, Venia Minks, MD  potassium chloride SA (KLOR-CON M) 20 MEQ tablet Take 1 tablet (20 mEq total) by mouth daily. 08/21/22  Yes [name], MD  Evolocumab [facility] SURECLICK) 161 MG/ML SOAJ INJECT 1 PEN SUBCUTANEOUSLY EVERY 14 DAYS 06/04/22   [name], MD  glucose blood (ACCU-CHEK GUIDE) test strip Use to test blood sugar 3 times daily 06/14/22   [name], MD  Lancets Misc. (ACCU-CHEK FASTCLIX LANCET) KIT Use to check blood sugar 3 times daily 06/14/22   [name], MD    Family History Family History  Problem Relation Age of Onset   Anesthesia problems Mother        hard to wake post op    Cerebrovascular Disease Mother        Never had stroke, but had CEA.   Liver cancer Father        mets to liver    Bladder Cancer Father    Colon cancer Father        mets to colon    Schizophrenia Daughter        Schizoaffective disorder   Anesthesia problems Son        stopped breathing post op    Tremor Son    Anesthesia problems Granddaughter        PONV   High blood pressure Other    Diabetes Other    Arthritis Other    Schizophrenia Child    Tremor Child    Dementia Neg Hx    Esophageal cancer Neg Hx    Rectal  cancer Neg Hx    Stomach cancer Neg Hx    Colon polyps Neg Hx    Social History Social History   Tobacco Use   Smoking status: Never   Smokeless tobacco: Never  Vaping Use   Vaping Use: Never used  Substance Use Topics   Alcohol use: Never    Comment: quit 1978   Drug use: Never   Allergies   Atenolol, Atorvastatin, Canagliflozin, Losartan potassium, Acarbose, Cinnamon, Empagliflozin, Glimepiride, Metformin and related, Other, Pioglitazone, Pravastatin, Sitagliptin, Spironolactone-hctz, Statins, Sulfamethoxazole, Tramadol hcl, Acarbose, Atenolol, Atorvastatin, Atorvastatin calcium, Canagliflozin, Crestor [rosuvastatin], Empagliflozin, Glimepiride, Losartan potassium, Metformin, Other, Pioglitazone, Pravastatin, Rosuvastatin calcium, Spironolactone-hctz, Tramadol, Lasix [furosemide], and Sulfa antibiotics  Review of Systems Review of Systems Pertinent findings revealed after performing a 14 point review of systems has been noted in the history of present illness.  Physical Exam Triage Vital Signs ED Triage Vitals  Enc Vitals Group     BP 10/13/21 0827 (!) 147/82     Pulse Rate 10/13/21 0827 72     Resp 10/13/21 0827 18     Temp 10/13/21 0827 98.3 F (36.8 C)     Temp Source 10/13/21 0827 Oral     SpO2 10/13/21 0827 98 %     Weight --      Height --      Head Circumference --      Peak Flow --      Pain Score 10/13/21 0826 5     Pain Loc --      Pain Edu? --  Excl. in GC? --   No data found.  Updated Vital Signs BP (!) 155/82 (BP Location: Right Arm)   Pulse 91   Temp 98.1 F (36.7 C) (Oral)   Resp 16   SpO2 100%   Physical Exam Vitals and nursing note reviewed.  Constitutional:      General: She is not in acute distress.    Appearance: Normal appearance. She is not ill-appearing.  HENT:     Head: Normocephalic and atraumatic.     Salivary Glands: Right salivary gland is not diffusely enlarged or tender. Left salivary gland is not diffusely enlarged or  tender.     Right Ear: Tympanic membrane, ear canal and external ear normal. No drainage. No middle ear effusion. There is no impacted cerumen. Tympanic membrane is not erythematous or bulging.     Left Ear: Tympanic membrane, ear canal and external ear normal. No drainage.  No middle ear effusion. There is no impacted cerumen. Tympanic membrane is not erythematous or bulging.     Nose: Nose normal. No nasal deformity, septal deviation, mucosal edema, congestion or rhinorrhea.     Right Turbinates: Not enlarged, swollen or pale.     Left Turbinates: Not enlarged, swollen or pale.     Right Sinus: No maxillary sinus tenderness or frontal sinus tenderness.     Left Sinus: No maxillary sinus tenderness or frontal sinus tenderness.     Mouth/Throat:     Lips: Pink. No lesions.     Mouth: Mucous membranes are moist. No oral lesions.     Pharynx: Oropharynx is clear. Uvula midline. No posterior oropharyngeal erythema or uvula swelling.     Tonsils: No tonsillar exudate. 0 on the right. 0 on the left.  Eyes:     General: Lids are normal.        Right eye: No discharge.        Left eye: No discharge.     Extraocular Movements: Extraocular movements intact.     Conjunctiva/sclera: Conjunctivae normal.     Right eye: Right conjunctiva is not injected.     Left eye: Left conjunctiva is not injected.  Neck:     Trachea: Trachea and phonation normal.  Cardiovascular:     Rate and Rhythm: Normal rate and regular rhythm.     Pulses: Normal pulses.     Heart sounds: Normal heart sounds. No murmur heard.    No friction rub. No gallop.  Pulmonary:     Effort: Pulmonary effort is normal. No tachypnea, bradypnea, accessory muscle usage, prolonged expiration, respiratory distress or retractions.     Breath sounds: Normal breath sounds and air entry. No stridor, decreased air movement or transmitted upper airway sounds. No decreased breath sounds, wheezing, rhonchi or rales.  Chest:     Chest wall: No  tenderness.  Musculoskeletal:        General: Normal range of motion.     Cervical back: Normal range of motion and neck supple. Normal range of motion.  Lymphadenopathy:     Cervical: No cervical adenopathy.  Skin:    General: Skin is warm and dry.     Findings: No erythema or rash.  Neurological:     General: No focal deficit present.     Mental Status: She is alert and oriented to person, place, and time.  Psychiatric:        Mood and Affect: Mood normal.        Behavior: Behavior normal.  Visual Acuity Right Eye Distance:   Left Eye Distance:   Bilateral Distance:    Right Eye Near:   Left Eye Near:    Bilateral Near:     UC Couse / Diagnostics / Procedures:     Radiology No results found.  Procedures Procedures (including critical care time) EKG  Pending results:  Labs Reviewed - No data to display  Medications Ordered in UC: Medications - No data to display  UC Diagnoses / Final Clinical Impressions(s)   I have reviewed the triage vital signs and the nursing notes.  Pertinent labs & imaging results that were available during my care of the patient were reviewed by me and considered in my medical decision making (see chart for details).    Final diagnoses:  [id number]   Patient advised I recommend she begin Paxlovid now and I apologize on behalf of the medical community for the delay in her care.  Patient was provided with a renal dosing.  Conservative care recommended.  COVID-19 education provided.  Return precautions advised.  Emergency precautions advised.  ED Prescriptions     Medication Sig Dispense Auth. Provider   nirmatrelvir & ritonavir (PAXLOVID, 150/100,) 10 x 150 MG & 10 x 100MG TBPK Take 2 tablets by mouth every 12 (twelve) hours for 5 days. 20 tablet Lynden Oxford Scales, PA-C      PDMP not reviewed this encounter.  Disposition Upon Discharge:  Condition: stable for discharge home Home: take medications as prescribed; routine  discharge instructions as discussed; follow up as advised.  Patient presented with an acute illness with associated systemic symptoms and significant discomfort requiring urgent management. In my opinion, this is a condition that a prudent lay person (someone who possesses an average knowledge of health and medicine) may potentially expect to result in complications if not addressed urgently such as respiratory distress, impairment of bodily function or dysfunction of bodily organs.   Routine symptom specific, illness specific and/or disease specific instructions were discussed with the patient and/or caregiver at length.   As such, the patient has been evaluated and assessed, work-up was performed and treatment was provided in alignment with urgent care protocols and evidence based medicine.  Patient/parent/caregiver has been advised that the patient may require follow up for further testing and treatment if the symptoms continue in spite of treatment, as clinically indicated and appropriate.  If the patient was tested for COVID-19, Influenza and/or RSV, then the patient/parent/guardian was advised to isolate at home pending the results of his/her diagnostic coronavirus test and potentially longer if they're positive. I have also advised pt that if his/her COVID-19 test returns positive, it's recommended to self-isolate for at least 10 days after symptoms first appeared AND until fever-free for 24 hours without fever reducer AND other symptoms have improved or resolved. Discussed self-isolation recommendations as well as instructions for household member/close contacts as per the [facility] and [place] DHHS, and also gave patient the [place] packet with this information.  Patient/parent/caregiver has been advised to return to the [facility] or PCP in 3-5 days if no better; to PCP or the Emergency Department if new signs and symptoms develop, or if the current signs or symptoms continue to change or worsen for further workup,  evaluation and treatment as clinically indicated and appropriate  The patient will follow up with their current PCP if and as advised. If the patient does not currently have a PCP we will assist them in obtaining one.   The patient may need specialty  follow up if the symptoms continue, in spite of conservative treatment and management, for further workup, evaluation, consultation and treatment as clinically indicated and appropriate.  Patient/parent/caregiver verbalized understanding and agreement of plan as discussed.  All questions were addressed during visit.  Please see discharge instructions below for further details of plan.  Discharge Instructions:   Discharge Instructions      Please begin Paxlovid by taking 2 tablets every 12 hours for the next 5 days.  While this medication will not make you feel better, you will keep you from feeling worse and is also been statistically proven to significantly decrease your risk of hospitalization due to side effects caused by COVID-19.  I enclosed information about COVID-19 and self-care at home that I hope you find helpful.  Thank you for visiting urgent care today, we hope you feel better soon.    This office note has been dictated using Museum/gallery curator.  Unfortunately, this method of dictation can sometimes lead to typographical or grammatical errors.  I apologize for your inconvenience in advance if this occurs.  Please do not hesitate to reach out to me if clarification is needed.      Lynden Oxford Scales, PA-C 11/10/22 1614

## 2022-11-10 NOTE — ED Triage Notes (Signed)
Chief Complaint: tested positive for Covid at home. Sore throat, fever 103, nausea, chills, headache, slight SOB with nasal congestion.   Onset: today was positive test, symptoms yesterday.   OTC medications tried: Yes- tylenol    with mild relief  Sick exposure: Yes- granddaughter was positive yesterday and a house guest is sick.   New foods or medications: No  Recent Travel: No

## 2022-11-12 ENCOUNTER — Telehealth: Payer: Self-pay | Admitting: Family Medicine

## 2022-11-12 NOTE — Telephone Encounter (Signed)
Pt was seen in [facility] on 11/10/22  Patient Name: [name] Gender: Female DOB: 23-Jun-1951 Age: 71 Y 98 M Return Phone Number: [phone] (Primary) Address: City/ State/ Zip: [place]  [address] Client [place] [facility] [place] Client Site [facility] [employer] Type Call Who Is Calling Patient / Member / Family / Caregiver Call Type Triage / Clinical Relationship To Patient Self Return Phone Number [phone] (Primary) Chief Complaint Cold Symptom Reason for Call Symptomatic / Request for [place] stated that she has covid and would like to be prescribed something Translation No Nurse Assessment Nurse: Tressia Danas, RN, Earnest Bailey Date/Time ([place] Time): 11/10/2022 1:03:17 PM Confirm and document reason for call. If symptomatic, describe symptoms. ---Caller stated that she has covid and would like to be prescribed something. caller states she tested positive this morning. states she took Paxlovid a year ago. Sore throat, upset stomach, fever, headache are her symptoms. caller states she has a little problem breathing but not bad. denies chest pain or pressure. Does the patient have any new or worsening symptoms? ---Yes Will a triage be completed? ---Yes Related visit to physician within the last 2 weeks? ---No Does the PT have any chronic conditions? (i.e. diabetes, asthma, this includes High risk factors for pregnancy, etc.) ---Yes List chronic conditions. ---diabetes, broken back in Feb. Uterine Cancer, hypertension, CKD. Is this a behavioral health or substance abuse call? ---No Guidelines Guideline Title Affirmed Question Affirmed Notes Nurse Date/Time (Eastern Time) COVID-19 - Diagnosed or Suspected MILD difficulty breathing (e.g., minimal/no SOB at rest, SOB with walking, pulse <100) McClarnon, RN, [facility] 11/10/2022 1:07:13 PM Disp. Time  Eilene Ghazi Time) Disposition Final User 11/10/2022 12:47:59 PM Send To Nurse Krista Blue, RN, [facility] 11/10/2022 1:10:02 PM See HCP within 4 Hours (or PCP triage) Yes Tressia Danas, RN, [facility] Final Disposition 11/10/2022 1:10:02 PM See HCP within 4 Hours (or PCP triage) Yes McClarnon, RN, [facility] Disagree/Comply Comply Caller Understands Yes PreDisposition Call Doctor Care Advice Given Per Guideline SEE HCP (OR PCP TRIAGE) WITHIN 4 HOURS: * IF OFFICE WILL BE CLOSED AND NO PCP (PRIMARY CARE PROVIDER) SECOND-LEVEL TRIAGE: You need to be seen within the next 3 or 4 hours. A nearby Urgent Care Center [facility]) is often a good source of care. Another choice is to go to the ED. Go sooner if you become worse. ALTERNATE DISPOSITION - CALL TELEMEDICINE DOCTOR NOW: * Telemedicine may be your best choice for care during this COVID-19 outbreak. * You should call a telemedicine doctor (or NP/PA) now, if your own doctor is not available. CARE ADVICE given per COVID-19 - DIAGNOSED OR SUSPECTED (Adult) guideline. * You become worse CALL BACK IF: Comments User: Dennard Nip, RN Date/Time Eilene Ghazi Time): 11/10/2022 1:17:36 PM offered virtual care thru [facility]. Pt declined. Referrals [facility] [place]

## 2022-11-12 NOTE — Telephone Encounter (Signed)
FYI, see Triage 

## 2022-11-14 ENCOUNTER — Ambulatory Visit: Payer: Medicare Other

## 2022-11-21 ENCOUNTER — Ambulatory Visit: Payer: Medicare Other

## 2022-11-22 ENCOUNTER — Ambulatory Visit: Payer: Medicare Other | Attending: Interventional Cardiology | Admitting: Interventional Cardiology

## 2022-11-30 ENCOUNTER — Ambulatory Visit (INDEPENDENT_AMBULATORY_CARE_PROVIDER_SITE_OTHER): Payer: Medicare Other | Admitting: *Deleted

## 2022-11-30 DIAGNOSIS — E538 Deficiency of other specified B group vitamins: Secondary | ICD-10-CM

## 2022-11-30 MED ORDER — CYANOCOBALAMIN 1000 MCG/ML IJ SOLN
1000.0000 ug | Freq: Once | INTRAMUSCULAR | Status: AC
  Administered 2022-11-30: 1000 ug via INTRAMUSCULAR

## 2022-11-30 NOTE — Progress Notes (Signed)
Per orders of Dr. Cherlynn Kaiser, injection of monthy B 12 given in left deltoid per patient preference by [name], CMA. Patient tolerated injection well. Patient reminded to schedule next injection.

## 2022-12-06 ENCOUNTER — Telehealth: Payer: Self-pay | Admitting: Family Medicine

## 2022-12-06 NOTE — Telephone Encounter (Signed)
agree

## 2022-12-06 NOTE — Telephone Encounter (Signed)
Patient scheduled for 12/07/2022.

## 2022-12-06 NOTE — Telephone Encounter (Signed)
Sent to triage for symptoms of low potassium, leg shakes and cramps, chills, very cold, ate 2 bananas and felt a little better but still not as well as should.

## 2022-12-06 NOTE — Telephone Encounter (Signed)
Patient Name: [name] Gender: Female DOB: 05-30-1951 Age: 71 Y 1 M 26 D Return Phone Number: [phone] (Primary) Address: City/ State/ Zip: [place]  [address] Client [place] [facility] [place] Client Site [place] [facility] [place] Contact Type Call Who Is Calling Patient / Member / Family / Caregiver Call Type Triage / Clinical Relationship To Patient Self Return Phone Number [phone] (Primary) Chief Complaint BREATHING - shortness of breath or sounds breathless Reason for Call Symptomatic / Request for Health Information Initial Comment Caller was transferred from the office, Patient is experiencing shakiness, chills, cold, and severe leg cramps, may has low potassium, shortness of breath. Translation No Nurse Assessment Nurse: Alvis Lemmings, RN, Marcie Bal Date/Time Eilene Ghazi Time): 12/06/2022 9:03:21 AM Confirm and document reason for call. If symptomatic, describe symptoms. ---Caller was transferred from the office, Patient is experiencing shakiness(for years), chills, (for years)cold, and severe leg cramps(last night), may has low potassium, shortness of breath. On diuretics, Bumide, popping K+. No CP Does the patient have any new or worsening symptoms? ---Yes Will a triage be completed? ---Yes Related visit to physician within the last 2 weeks? ---No Does the PT have any chronic conditions? (i.e. diabetes, asthma, this includes High risk factors for pregnancy, etc.) ---Yes List chronic conditions. ---Covid 6 weeks ago, brain calcifications, memory, CA endometrial, Fall in February, break to back, HTN, Diabetic2, On diuretics, Is this a behavioral health or substance abuse call? ---No Guidelines Guideline Title Affirmed Question Affirmed Notes Nurse Date/Time (Eastern Time) Muscle Aches and Body Pain Patient sounds very sick or weak to the triager Alvis Lemmings, RN, Marcie Bal 12/06/2022 9:11:01 AM PLEASE  NOTE: All timestamps contained within this report are represented as [place] Standard Time. CONFIDENTIALTY NOTICE: This fax transmission is intended only for the addressee. It contains information that is legally privileged, confidential or otherwise protected from use or disclosure. If you are not the intended recipient, you are strictly prohibited from reviewing, disclosing, copying using or disseminating any of this information or taking any action in reliance on or regarding this information. If you have received this fax in error, please notify us immediately by telephone so that we can arrange for its return to [name]. Phone: [phone], Toll-Free: [phone], Fax: [phone] Page: 2 of 2 Call Id: [id number] [place]. Time Eilene Ghazi Time) Disposition Final User 12/06/2022 9:01:34 AM Send to Urgent Clarnce Flock 12/06/2022 9:14:31 AM Go to ED Now (or PCP triage) Yes Alvis Lemmings, RN, Marcie Bal Final Disposition 12/06/2022 9:14:31 AM Go to ED Now (or PCP triage) Yes Alvis Lemmings, RN, Lenon Oms Disagree/Comply Comply Caller Understands Yes PreDisposition Call Doctor Care Advice Given Per Guideline GO TO ED NOW (OR PCP TRIAGE): CARE ADVICE given per Muscle Aches and Body Pain (Adult) guideline. Comments User: Manning Charity, RN Date/Time Eilene Ghazi Time): 12/06/2022 9:06:19 AM Covid 6 weeks ago Referrals GO TO FACILITY REFUSED

## 2022-12-07 ENCOUNTER — Ambulatory Visit (INDEPENDENT_AMBULATORY_CARE_PROVIDER_SITE_OTHER): Payer: Medicare Other | Admitting: Family Medicine

## 2022-12-07 ENCOUNTER — Other Ambulatory Visit (INDEPENDENT_AMBULATORY_CARE_PROVIDER_SITE_OTHER): Payer: Medicare Other

## 2022-12-07 ENCOUNTER — Encounter: Payer: Self-pay | Admitting: Family Medicine

## 2022-12-07 VITALS — BP 130/70 | HR 50 | Temp 98.0°F | Ht 62.0 in | Wt 186.1 lb

## 2022-12-07 DIAGNOSIS — R252 Cramp and spasm: Secondary | ICD-10-CM | POA: Diagnosis not present

## 2022-12-07 DIAGNOSIS — R600 Localized edema: Secondary | ICD-10-CM | POA: Diagnosis not present

## 2022-12-07 LAB — COMPREHENSIVE METABOLIC PANEL
ALT: 16 U/L (ref 0–35)
AST: 28 U/L (ref 0–37)
Albumin: 4.3 g/dL (ref 3.5–5.2)
Alkaline Phosphatase: 55 U/L (ref 39–117)
BUN: 23 mg/dL (ref 6–23)
CO2: 30 mEq/L (ref 19–32)
Calcium: 9.8 mg/dL (ref 8.4–10.5)
Chloride: 101 mEq/L (ref 96–112)
Creatinine, Ser: 1.21 mg/dL — ABNORMAL HIGH (ref 0.40–1.20)
GFR: 44.57 mL/min — ABNORMAL LOW (ref >60.00)
Glucose, Bld: 179 mg/dL — ABNORMAL HIGH (ref 70–99)
Potassium: 3.8 mEq/L (ref 3.5–5.1)
Sodium: 141 mEq/L (ref 135–145)
Total Bilirubin: 0.7 mg/dL (ref 0.2–1.2)
Total Protein: 7.4 g/dL (ref 6.0–8.3)

## 2022-12-07 LAB — MAGNESIUM: Magnesium: 1.9 mg/dL (ref 1.5–2.5)

## 2022-12-07 MED ORDER — TORSEMIDE 20 MG PO TABS: 20.0000 mg | ORAL_TABLET | Freq: Every day | ORAL | 1 refills | Status: DC

## 2022-12-07 MED ORDER — POTASSIUM CHLORIDE CRYS ER 20 MEQ PO TBCR: 20.0000 meq | EXTENDED_RELEASE_TABLET | Freq: Every day | ORAL | 1 refills | Status: DC

## 2022-12-07 NOTE — Progress Notes (Signed)
Subjective:     Patient ID: [name], female    DOB: Feb 14, 1951, 71 y.o.   MRN: [id number]  Chief Complaint  Patient presents with   Leg cramping    Extreme leg cramping and shakiness, noticed about 6 weeks ago, worse past few days Believes it maybe low potassium, have been taking potassium pills and eating a lot of bananas     HPI Cramping in legs, shakiness. Started about 6 wks ago.  Some days, hard to even get up. Concerned about potassium.  Taking K otc 8-10/day, bananas, chills.  Taking bumex again as out of lasix(has issues w/meds so keeps changing).  In the past, walking would help, but not this time.  Cramping in hands and feet and "drawn up",  some palpitations and some trouble breathing.  Feeling better since doing a lot of bananas and otc K.   Didn't take Bumex yesterday.  More chills past few days. Taking 2 Mg/day  Bumex reactions at times. So takes lasix.  Health Maintenance Due  Topic Date Due   Medicare Annual Wellness (AWV)  Never done   OPHTHALMOLOGY EXAM  Never done    Past Medical History:  Diagnosis Date   Allergy    Anemia    Arthritis    Asthma    Dianosed years ago-no meds at this time   Cancer [facility]) 2021   endometrial    Cataract    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 name of MD    Dementia ([place])    Diabetes mellitus    type 2    Diverticulosis    Dyslipidemia    Dyspnea    with exertion    Dysrhythmia    "skips beats"   Endometrial cancer ([place]) 2021   Family history of adverse reaction to anesthesia    son stopped breathing during surgery , mother slow to wake up    Fibromyalgia 1980's   Generalized anxiety disorder    GERD (gastroesophageal reflux disease)    occ- will use Tums and Prolosec  prn    Heart murmur    Hyperlipemia    Hypertension    not on blood pressure meds due to allergies -  last dose of meds- months ago    Hypothyroidism    Hypothyroidism (acquired)    Internal hemorrhoids    Irritable bowel syndrome 1980's   Liver cancer ([place])    Major depressive disorder    Migraine headaches 1980's   On meds-well controlled   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   Morbid obesity ([place])    Pernicious anemia    Pneumonia 2009   Several times over the past several years   PONV (postoperative nausea and vomiting)    was told by neurologist due to calcificaton in brain not to be put to sleep, N/V during Op and Recovery    Recurrent upper respiratory infection (URI)    Sleep apnea    no cpap   Subarachnoid hemorrhage ([place]) 2010   Vaginal polyp    benign per pt    Past Surgical History:  Procedure Laterality Date   CATARACT EXTRACTION Right    CHOLECYSTECTOMY  1983   COLONOSCOPY  2008   DB   DILATION AND CURETTAGE OF UTERUS  1972   HYSTEROSCOPY WITH D & C  06/29/2011   Procedure:  DILATATION AND CURETTAGE (D&C) /HYSTEROSCOPY;  Surgeon: Donnamae Jude, MD;  Location: [facility];  Service: Gynecology;  Laterality: N/A;   ROBOTIC ASSISTED TOTAL HYSTERECTOMY WITH BILATERAL SALPINGO OOPHERECTOMY N/A 10/27/2020   Procedure: XI ROBOTIC ASSISTED TOTAL HYSTERECTOMY WITH BILATERAL SALPINGO OOPHORECTOMY;  Surgeon: Everitt Amber, MD;  Location: [facility];  Service: Gynecology;  Laterality: N/A;   SENTINEL NODE BIOPSY N/A 10/27/2020   Procedure: SENTINEL NODE BIOPSY;  Surgeon: Everitt Amber, MD;  Location: [facility];  Service: Gynecology;  Laterality: N/A;    Outpatient Medications Prior to Visit  Medication Sig Dispense Refill   acetaminophen (TYLENOL) 500 MG tablet Take 500 mg by mouth every 6 (six) hours as needed for moderate pain.     aspirin EC 325 MG tablet Take 1 tablet (325 mg total) by mouth daily. 90 tablet 3   Blood Glucose Monitoring Suppl (ACCU-CHEK GUIDE) w/Device KIT Use to test blood sugar 3 times daily. 1 kit 0   Cholecalciferol 25 MCG (1000 UT) capsule Take 1 capsule  (1,000 Units total) by mouth daily. 90 capsule 3   cyanocobalamin (,VITAMIN B-12,) 1000 MCG/ML injection Inject 1,000 mcg into the muscle every 30 (thirty) days.      Evolocumab (REPATHA SURECLICK) 250 MG/ML SOAJ INJECT 1 PEN SUBCUTANEOUSLY EVERY 14 DAYS 6 mL 3   glucose blood (ACCU-CHEK GUIDE) test strip Use to test blood sugar 3 times daily 100 each 2   Lancets Misc. (ACCU-CHEK FASTCLIX LANCET) KIT Use to check blood sugar 3 times daily 1 kit 0   levothyroxine (SYNTHROID) 50 MCG tablet Take 1 tablet (50 mcg total) by mouth daily. 90 tablet 3   Multiple Vitamin (MULTIVITAMIN WITH MINERALS) TABS tablet Take 1 tablet by mouth daily. 30 tablet 0   pantoprazole (PROTONIX) 20 MG tablet Take 1 tablet (20 mg total) by mouth daily. 90 tablet 3   bumetanide (BUMEX) 2 MG tablet Take 2 mg by mouth every morning.     potassium chloride SA (KLOR-CON M) 20 MEQ tablet Take 1 tablet (20 mEq total) by mouth daily. 90 tablet 0   No facility-administered medications prior to visit.    Allergies  Allergen Reactions   Atenolol     Stomach problems, chills   Atorvastatin     Other reaction(s): retain fld   Canagliflozin     ALL DIABETIC MEDS PER PT   abdominal pain, headache   Losartan Potassium     chills, HA, stomach pain   Acarbose Nausea Only     headache,nausea   Cinnamon     Stomach pain   Empagliflozin      headaches   Glimepiride     ALL DIABETIC MEDS PER PT  abdominal pain, headache   Metformin And Related Nausea And Vomiting and Other (See Comments)    Patient states that she has severe chills, headache and cramping additionally. Says blood sugar is uncontrolled because of this   Other Other (See Comments)    ALL DIABETIC MEDS PER PT    Pioglitazone Other (See Comments)    ALL DIABETIC MEDS PER PT  Other reaction(s): peripheral edema   Pravastatin     Other reaction(s): retain fld   Sitagliptin     ALL DIABETIC MEDS PER PT  Other reaction(s): headache   Spironolactone-Hctz Other  (See Comments)   Statins     Myalgia   Sulfamethoxazole Other (See Comments)    ALL DIABETIC MEDS PER PT    Tramadol Hcl Itching   Acarbose Other (See  Comments)   Atenolol Other (See Comments)   Atorvastatin Other (See Comments)   Atorvastatin Calcium     Other reaction(s): retain fld   Canagliflozin Other (See Comments)   Crestor [Rosuvastatin] Other (See Comments)   Empagliflozin Other (See Comments)   Glimepiride Other (See Comments)   Losartan Potassium Other (See Comments)   Metformin Other (See Comments)   Other Other (See Comments)   Pioglitazone Other (See Comments)   Pravastatin Other (See Comments)   Rosuvastatin Calcium    Spironolactone-Hctz Other (See Comments)   Tramadol    Lasix [Furosemide] Rash    Sugars increased   Sulfa Antibiotics Rash and Other (See Comments)    Mother has told patient in the past not to take, she cannot recall there reaction   ROS neg/noncontributory except as noted HPI/below      Objective:     BP 130/70   Pulse (!) 50   Temp 98 F (36.7 C) (Temporal)   Ht _0  (1.575 m)   Wt 186 lb 2 oz (84.4 kg)   SpO2 99%   BMI 34.04 kg/m  Wt Readings from Last 3 Encounters:  12/07/22 186 lb 2 oz (84.4 kg)  10/29/22 181 lb (82.1 kg)  10/24/22 182 lb (82.6 kg)    Physical Exam   Gen: WDWN NAD HEENT: NCAT, conjunctiva not injected, sclera nonicteric NECK:  supple, no thyromegaly, no nodes, no carotid bruits CARDIAC: RRR, S1S2+, no murmur. DP1+B LUNGS: CTAB. No wheezes ABDOMEN:  BS+, soft, NTND, No HSM, no masses EXT:  no edema MSK: cane  NEURO: A&O x3.  CN II-XII intact.  PSYCH: normal mood. Good eye contact     Assessment & Plan:   Problem List Items Addressed This Visit   None Visit Diagnoses     Cramps, muscle, general    -  Primary   Relevant Orders   Comprehensive metabolic panel   TSH   Magnesium      Muscle cramps-pt on diuretics, not able to fill rx K.  Also w/chronic diarrhea.  May be low K and Mg, other.   Some better on otc past 2 days.  Will send to [facility] lab(ours closed today)-cmp,tsh,cbc.  Renewed K.  Worse, ER Local edema-SE to bumex, lasix not work well.  Will start torsemide 45m daily.  Keep in touch.   Meds ordered this encounter  Medications   potassium chloride SA (KLOR-CON M) 20 MEQ tablet    Sig: Take 1 tablet (20 mEq total) by mouth daily.    Dispense:  90 tablet    Refill:  1   torsemide (DEMADEX) 20 MG tablet    Sig: Take 1 tablet (20 mg total) by mouth daily.    Dispense:  90 tablet    Refill:  1    AWellington Hampshire MD

## 2022-12-07 NOTE — Patient Instructions (Signed)
Merry Christmas  Changing bumedinide(bumex) to torsemide.  Sent in potassium as well.   Get labs done.  get X-ray/labs at [facility].  [place]  hours 8=M-F 8:30-5.  closed 12:30-1 lunch

## 2022-12-08 LAB — TSH: TSH: 1.02 u[IU]/mL (ref 0.35–5.50)

## 2022-12-08 NOTE — Addendum Note (Signed)
Addended by: Wellington Hampshire on: 12/08/2022 06:05 PM   Modules accepted: Orders

## 2022-12-08 NOTE — Progress Notes (Signed)
D/w pt on 12/23 at 6pm.  Mg normal-cont otc as is.  K normal but will never know if was really low.  Back on 6mq rx but now on torsemide.  Can still do 2 otc K.  Reck K on Wednesday.

## 2022-12-12 ENCOUNTER — Other Ambulatory Visit (INDEPENDENT_AMBULATORY_CARE_PROVIDER_SITE_OTHER): Payer: Medicare Other

## 2022-12-12 DIAGNOSIS — R252 Cramp and spasm: Secondary | ICD-10-CM

## 2022-12-13 LAB — BASIC METABOLIC PANEL
BUN: 19 mg/dL (ref 6–23)
CO2: 27 mEq/L (ref 19–32)
Calcium: 9.3 mg/dL (ref 8.4–10.5)
Chloride: 105 mEq/L (ref 96–112)
Creatinine, Ser: 1.15 mg/dL (ref 0.40–1.20)
GFR: 47.37 mL/min — ABNORMAL LOW (ref >60.00)
Glucose, Bld: 234 mg/dL — ABNORMAL HIGH (ref 70–99)
Potassium: 4 mEq/L (ref 3.5–5.1)
Sodium: 142 mEq/L (ref 135–145)

## 2022-12-14 ENCOUNTER — Telehealth: Payer: Self-pay | Admitting: Family Medicine

## 2022-12-14 NOTE — Telephone Encounter (Signed)
Returned call to patient, gave lab results and recommendations. Patient verbalized understanding.

## 2022-12-14 NOTE — Telephone Encounter (Signed)
Patient requests to be called at ph# [phone] to be given lab results

## 2022-12-19 ENCOUNTER — Ambulatory Visit (INDEPENDENT_AMBULATORY_CARE_PROVIDER_SITE_OTHER): Payer: Medicare Other | Admitting: Family Medicine

## 2022-12-19 ENCOUNTER — Encounter: Payer: Self-pay | Admitting: Family Medicine

## 2022-12-19 VITALS — BP 142/80 | HR 95 | Temp 98.5°F | Ht 62.0 in | Wt 190.5 lb

## 2022-12-19 DIAGNOSIS — N1831 Chronic kidney disease, stage 3a: Secondary | ICD-10-CM

## 2022-12-19 DIAGNOSIS — R252 Cramp and spasm: Secondary | ICD-10-CM

## 2022-12-19 DIAGNOSIS — R0602 Shortness of breath: Secondary | ICD-10-CM

## 2022-12-19 DIAGNOSIS — E1122 Type 2 diabetes mellitus with diabetic chronic kidney disease: Secondary | ICD-10-CM | POA: Diagnosis not present

## 2022-12-19 DIAGNOSIS — R600 Localized edema: Secondary | ICD-10-CM | POA: Diagnosis not present

## 2022-12-19 DIAGNOSIS — H353122 Nonexudative age-related macular degeneration, left eye, intermediate dry stage: Secondary | ICD-10-CM | POA: Diagnosis not present

## 2022-12-19 LAB — BASIC METABOLIC PANEL
BUN: 22 mg/dL (ref 6–23)
CO2: 28 mEq/L (ref 19–32)
Calcium: 9.5 mg/dL (ref 8.4–10.5)
Chloride: 105 mEq/L (ref 96–112)
Creatinine, Ser: 1.16 mg/dL (ref 0.40–1.20)
GFR: 46.88 mL/min — ABNORMAL LOW (ref >60.00)
Glucose, Bld: 163 mg/dL — ABNORMAL HIGH (ref 70–99)
Potassium: 4.3 mEq/L (ref 3.5–5.1)
Sodium: 142 mEq/L (ref 135–145)

## 2022-12-19 LAB — BRAIN NATRIURETIC PEPTIDE: Pro B Natriuretic peptide (BNP): 92 pg/mL (ref 0.0–100.0)

## 2022-12-19 NOTE — Progress Notes (Signed)
Subjective:     Patient ID: [name], female    DOB: [birth year], 72 y.o.   MRN: [id number]  Chief Complaint  Patient presents with   Follow-up    2 month follow-up    HPI  Muscle cramps-better, not gone. On Kcl 67mq and 2 otc.   B12 deficiency-last injection 12/15. DM type 2-intol meds.  Not doing great on diet/exercise.  Edema-torsemide doing better-chills not as bad. Gaining wt Card-seeing PA-wonders if in CHF.  No h/o CAD.  On repatha. Sees card in few wks. Gaining wt. When lies down at night-some wheezing. Mac degeneration-told needs to start shots for dry.   Health Maintenance Due  Topic Date Due   Medicare Annual Wellness (AWV)  Never done   OPHTHALMOLOGY EXAM  Never done    Past Medical History:  Diagnosis Date   Allergy    Anemia    Arthritis    Asthma    Dianosed years ago-no meds at this time   [facility]   endometrial    Cataract    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 name of MD    D[place]    Diabetes mellitus    type 2    Diverticulosis    Dyslipidemia    Dyspnea    with exertion    Dysrhythmia    "skips beats"   [place]ometrial cancer [place]   Family history of adverse reaction to anesthesia    son stopped breathing during surgery , mother slow to wake up    Fibromyalgia 1980's   Generalized anxiety disorder    GERD (gastroesophageal reflux disease)    occ- will use Tums and Prolosec  prn    Heart murmur    Hyperlipemia    Hypertension    not on blood pressure meds due to allergies - last dose of meds- months ago    Hypothyroidism    Hypothyroidism (acquired)    Internal hemorrhoids    Irritable bowel syndrome 1980's   Liver cancer ([place]    Major depressive disorder    Migraine headaches 1980's   On meds-well controlled   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   Morbid obesity  ([place]    Pernicious anemia    Pneumonia 2009   Several times over the past several years   PONV (postoperative nausea and vomiting)    was told by neurologist due to calcificaton in brain not to be put to sleep, N/V during Op and Recovery    Recurrent upper respiratory infection (URI)    Sleep apnea    no cpap   Subarachnoid hemorrhage ([place] 2010   Vaginal polyp    benign per pt    Past Surgical History:  Procedure Laterality Date   CATARACT EXTRACTION Right    CHOLECYSTECTOMY  1983   COLONOSCOPY  2008   DB   DILATION AND CURETTAGE OF UTERUS  1972   HYSTEROSCOPY WITH D & C  06/29/2011   Procedure: DILATATION AND CURETTAGE (D&C) /HYSTEROSCOPY;  Surgeon: TDonnamae Jude MD;  [facility];  Service: Gynecology;  Laterality: N/A;   ROBOTIC ASSISTED TOTAL HYSTERECTOMY WITH BILATERAL SALPINGO OOPHERECTOMY N/A 10/27/2020   Procedure: XI ROBOTIC ASSISTED TOTAL HYSTERECTOMY WITH BILATERAL SALPINGO OOPHORECTOMY;  Surgeon: REveritt Amber MD;  [facility];  Service: Gynecology;  Laterality: N/A;  SENTINEL NODE BIOPSY N/A 10/27/2020   Procedure: SENTINEL NODE BIOPSY;  Surgeon: Everitt Amber, MD;  Location: [facility];  Service: Gynecology;  Laterality: N/A;    Outpatient Medications Prior to Visit  Medication Sig Dispense Refill   acetaminophen (TYLENOL) 500 MG tablet Take 500 mg by mouth every 6 (six) hours as needed for moderate pain.     aspirin EC 325 MG tablet Take 1 tablet (325 mg total) by mouth daily. 90 tablet 3   Blood Glucose Monitoring Suppl (ACCU-CHEK GUIDE) w/Device KIT Use to test blood sugar 3 times daily. 1 kit 0   Cholecalciferol 25 MCG (1000 UT) capsule Take 1 capsule (1,000 Units total) by mouth daily. 90 capsule 3   cyanocobalamin (,VITAMIN B-12,) 1000 MCG/ML injection Inject 1,000 mcg into the muscle every 30 (thirty) days.      Evolocumab (REPATHA SURECLICK) 893 MG/ML SOAJ INJECT 1 PEN SUBCUTANEOUSLY EVERY 14 DAYS 6 mL 3   glucose blood (ACCU-CHEK GUIDE) test strip Use  to test blood sugar 3 times daily 100 each 2   Lancets Misc. (ACCU-CHEK FASTCLIX LANCET) KIT Use to check blood sugar 3 times daily 1 kit 0   levothyroxine (SYNTHROID) 50 MCG tablet Take 1 tablet (50 mcg total) by mouth daily. 90 tablet 3   Multiple Vitamin (MULTIVITAMIN WITH MINERALS) TABS tablet Take 1 tablet by mouth daily. 30 tablet 0   pantoprazole (PROTONIX) 20 MG tablet Take 1 tablet (20 mg total) by mouth daily. 90 tablet 3   potassium chloride SA (KLOR-CON M) 20 MEQ tablet Take 1 tablet (20 mEq total) by mouth daily. 90 tablet 1   torsemide (DEMADEX) 20 MG tablet Take 1 tablet (20 mg total) by mouth daily. 90 tablet 1   No facility-administered medications prior to visit.    Allergies  Allergen Reactions   Atenolol     Stomach problems, chills   Atorvastatin     Other reaction(s): retain fld   Canagliflozin     ALL DIABETIC MEDS PER PT   abdominal pain, headache   Losartan Potassium     chills, HA, stomach pain   Acarbose Nausea Only     headache,nausea   Cinnamon     Stomach pain   Empagliflozin      headaches   Glimepiride     ALL DIABETIC MEDS PER PT  abdominal pain, headache   Metformin And Related Nausea And Vomiting and Other (See Comments)    Patient states that she has severe chills, headache and cramping additionally. Says blood sugar is uncontrolled because of this   Other Other (See Comments)    ALL DIABETIC MEDS PER PT    Pioglitazone Other (See Comments)    ALL DIABETIC MEDS PER PT  Other reaction(s): peripheral edema   Pravastatin     Other reaction(s): retain fld   Sitagliptin     ALL DIABETIC MEDS PER PT  Other reaction(s): headache   Spironolactone-Hctz Other (See Comments)   Statins     Myalgia   Sulfamethoxazole Other (See Comments)    ALL DIABETIC MEDS PER PT    Tramadol Hcl Itching   Acarbose Other (See Comments)   Atenolol Other (See Comments)   Atorvastatin Other (See Comments)   Atorvastatin Calcium     Other reaction(s): retain  fld   Canagliflozin Other (See Comments)   Crestor [Rosuvastatin] Other (See Comments)   Empagliflozin Other (See Comments)   Glimepiride Other (See Comments)   Losartan Potassium Other (See Comments)  Metformin Other (See Comments)   Other Other (See Comments)   Pioglitazone Other (See Comments)   Pravastatin Other (See Comments)   Rosuvastatin Calcium    Spironolactone-Hctz Other (See Comments)   Tramadol    Lasix [Furosemide] Rash    Sugars increased   Sulfa Antibiotics Rash and Other (See Comments)    Mother has told patient in the past not to take, she cannot recall there reaction   ROS neg/noncontributory except as noted HPI/below Drooling-more at night.  Occ day.  Choking occ. Tested for Parkinson's 1.5 yrs ago and neg, but has parkinsonism. Chronic dizziness-uses Cane and occ walker.       Objective:     BP (!) 142/80 (BP Location: Left Arm, Patient Position: Sitting, Cuff Size: Normal)   Pulse 95   Temp 98.5 F (36.9 C) (Temporal)   Ht 5' 2" (1.575 m)   Wt 190 lb 8 oz (86.4 kg)   SpO2 99%   BMI 34.84 kg/m  Wt Readings from Last 3 Encounters:  12/19/22 190 lb 8 oz (86.4 kg)  12/07/22 186 lb 2 oz (84.4 kg)  10/29/22 181 lb (82.1 kg)    Physical Exam   Gen: WDWN NAD HEENT: NCAT, conjunctiva not injected, sclera nonicteric NECK:  supple, no thyromegaly, no nodes, no carotid bruits CARDIAC: RRR, S1S2+,1/6 murmur. DP 2+B LUNGS: CTAB. No wheezes ABDOMEN:  BS+, soft, NTND, No HSM, no masses EXT:  no edema MSK: no gross abnormalities.  NEURO: A&O x3.  CN II-XII intact.  PSYCH: normal mood. Good eye contact     Assessment & Plan:   Problem List Items Addressed This Visit       Endocrine   Type 2 diabetes mellitus with stage 3 chronic kidney disease, without long-term current use of insulin ([place])     Other   Local edema - Primary   Relevant Orders   Basic Metabolic Panel (BMET) (Completed)   B Nat Peptide (Completed)   Intermediate stage  nonexudative age-related macular degeneration of left eye   Other Visit Diagnoses     Cramps, muscle, general       Shortness of breath       Relevant Orders   B Nat Peptide (Completed)     1.  Type 2 diabetes with stage IIIa chronic kidney disease-patient is intolerant to multiple medications.  She tries to control her diabetes with diet, however has been gaining weight lately and is not very mobile.  Advise she has got to do better on her diet.  Follow-up in 2 months 2.  Muscle cramps-some better on potassium.  Still gets some.  Will recheck BMP 3.  Edema-chronic.  Currently on torsemide.  Also on potassium 20 mEq daily as well as on her own taking over-the-counter 2 potassium pills daily as well.  Check BMP.  Check BNP to rule out CHF.  She will be seeing cardiology in a few weeks. 4.  Occasional shortness of breath-probably more deconditioning.  Patient is content concerned about CHF.  Will check BN P.  Has follow-up with cardiology. 5.  Macular degeneration-patient is seeing ophthalmology.  They recommend injections.  Patient is hesitant to do this as she is the only person who drives I am concerned about how she will be able to get around.  Other family members have been encouraging her to do the treatment to try to preserve her vision.  No orders of the defined types were placed in this encounter.   Wellington Hampshire,  MD

## 2022-12-19 NOTE — Patient Instructions (Signed)
It was very nice to see you today!  Work on diet/exercise   PLEASE NOTE:  If you had any lab tests please let us know if you have not heard back within a few days. You may see your results on MyChart before we have a chance to review them but we will give you a call once they are reviewed by us. If we ordered any referrals today, please let us know if you have not heard from their office within the next week.   Please try these tips to maintain a healthy lifestyle:  Eat most of your calories during the day when you are active. Eliminate processed foods including packaged sweets (pies, cakes, cookies), reduce intake of potatoes, white bread, white pasta, and white rice. Look for whole grain options, oat flour or almond flour.  Each meal should contain half fruits/vegetables, one quarter protein, and one quarter carbs (no bigger than a computer mouse).  Cut down on sweet beverages. This includes juice, soda, and sweet tea. Also watch fruit intake, though this is a healthier sweet option, it still contains natural sugar! Limit to 3 servings daily.  Drink at least 1 glass of water with each meal and aim for at least 8 glasses per day  Exercise at least 150 minutes every week.   

## 2022-12-27 ENCOUNTER — Other Ambulatory Visit (HOSPITAL_COMMUNITY): Payer: Self-pay

## 2022-12-31 ENCOUNTER — Other Ambulatory Visit: Payer: Self-pay | Admitting: Family Medicine

## 2023-01-01 ENCOUNTER — Ambulatory Visit: Payer: Medicare Other | Admitting: Podiatry

## 2023-01-02 ENCOUNTER — Ambulatory Visit: Payer: 59 | Admitting: Podiatry

## 2023-01-07 ENCOUNTER — Ambulatory Visit: Payer: 59 | Attending: Interventional Cardiology | Admitting: Physician Assistant

## 2023-01-07 ENCOUNTER — Encounter: Payer: Self-pay | Admitting: Physician Assistant

## 2023-01-07 VITALS — BP 150/60 | HR 93 | Ht 62.0 in | Wt 193.0 lb

## 2023-01-07 DIAGNOSIS — R0602 Shortness of breath: Secondary | ICD-10-CM | POA: Diagnosis not present

## 2023-01-07 DIAGNOSIS — R635 Abnormal weight gain: Secondary | ICD-10-CM | POA: Diagnosis not present

## 2023-01-07 DIAGNOSIS — R609 Edema, unspecified: Secondary | ICD-10-CM | POA: Diagnosis not present

## 2023-01-07 NOTE — Progress Notes (Signed)
Office Visit    Patient Name: [name] Date of Encounter: 01/07/2023  PCP:  [name], MD   [place]  Cardiologist:  Larae Grooms, MD  Advanced Practice Provider:  No care team member to display Electrophysiologist:  None   HPI    [name] is a 72 y.o. female with a past medical history of mild CAD, PVCs, PACs, anemia, CKD, dementia, DM, HLD, GERD, fibromyalgia, hypothyroidism, obesity, sleep apnea, and prior subarachnoid hemorrhage presents today for follow-up appointment.  She was followed by Dr. Irish Lack with coronary CTA May 2021 with mild three-vessel CAD.  Had issues over the last few years with lower extremity edema which was controlled with Bumex.  She called our office 08/23/2022 with contrast plaints of chest pain.  She was last seen 08/24/2022 by Dr. Angelena Form.  At that time she endorsed "twinges" a month prior.  These lasted for only a few seconds.  They usually occurred when she was laying in bed at night.  She reminded him that she had allergies to 35 medications and was concerned that there was not "chills" listed on her allergy list as a symptom.  She felt well at that time other than having chills.  No dyspnea with exertion.  Some lower extremity edema but resolves at night.  No cardiac workup was indicated at that time.  Today, she tells me that she has had chills for years.  She experiences wheezing at night and chest pain randomly throughout the day.  She has had some weight gain over the past 4 to 6 months.  She also has had a lot of drooling mostly at night but now is occurring during the day.  She has been on fluid medications since her 48s and she has had low potassium in the past.  She does take potassium supplements.  Last potassium checked by her PCP was normal.  She was recently diagnosed with macular degeneration and she has it in both eyes.  Vision has gotten worse.  Her nose is also running a lot especially after  eating.  She endorses a slight cough as well. BNP normal at her PCPs office.  Reports no shortness of breath nor dyspnea on exertion. No edema, orthopnea, PND. Reports no palpitations.     Past Medical History    Past Medical History:  Diagnosis Date   Allergy    Anemia    Arthritis    Asthma    Dianosed years ago-no meds at this time   Cancer [facility]) 2021   endometrial    Cataract    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 name of MD    Dementia ([place])    Diabetes mellitus    type 2    Diverticulosis    Dyslipidemia    Dyspnea    with exertion    Dysrhythmia    "skips beats"   Endometrial cancer ([place]) 2021   Family history of adverse reaction to anesthesia    son stopped breathing during surgery , mother slow to wake up    Fibromyalgia 1980's   Generalized anxiety disorder    GERD (gastroesophageal reflux disease)    occ- will use Tums and Prolosec  prn    Heart murmur    Hyperlipemia    Hypertension    not  on blood pressure meds due to allergies - last dose of meds- months ago    Hypothyroidism    Hypothyroidism (acquired)    Internal hemorrhoids    Irritable bowel syndrome 1980's   Liver cancer ([place])    Major depressive disorder    Migraine headaches 1980's   On meds-well controlled   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   Morbid obesity ([place])    Pernicious anemia    Pneumonia 2009   Several times over the past several years   PONV (postoperative nausea and vomiting)    was told by neurologist due to calcificaton in brain not to be put to sleep, N/V during Op and Recovery    Recurrent upper respiratory infection (URI)    Sleep apnea    no cpap   [place]   Vaginal polyp    benign per pt   Past Surgical History:  Procedure Laterality Date   CATARACT EXTRACTION Right    CHOLECYSTECTOMY  1983    COLONOSCOPY  2008   DB   DILATION AND CURETTAGE OF UTERUS  1972   HYSTEROSCOPY WITH D & C  06/29/2011   Procedure: DILATATION AND CURETTAGE (D&C) /HYSTEROSCOPY;  Surgeon: Donnamae Jude, MD;  Location: [facility];  Service: Gynecology;  Laterality: N/A;   ROBOTIC ASSISTED TOTAL HYSTERECTOMY WITH BILATERAL SALPINGO OOPHERECTOMY N/A 10/27/2020   Procedure: XI ROBOTIC ASSISTED TOTAL HYSTERECTOMY WITH BILATERAL SALPINGO OOPHORECTOMY;  Surgeon: Everitt Amber, MD;  Location: [facility];  Service: Gynecology;  Laterality: N/A;   SENTINEL NODE BIOPSY N/A 10/27/2020   Procedure: SENTINEL NODE BIOPSY;  Surgeon: Everitt Amber, MD;  Location: [facility];  Service: Gynecology;  Laterality: N/A;    Allergies  Allergies  Allergen Reactions   Atenolol     Stomach problems, chills   Atorvastatin     Other reaction(s): retain fld   Canagliflozin     ALL DIABETIC MEDS PER PT   abdominal pain, headache   Losartan Potassium     chills, HA, stomach pain   Acarbose Nausea Only     headache,nausea   Cinnamon     Stomach pain   Empagliflozin      headaches   Glimepiride     ALL DIABETIC MEDS PER PT  abdominal pain, headache   Metformin And Related Nausea And Vomiting and Other (See Comments)    Patient states that she has severe chills, headache and cramping additionally. Says blood sugar is uncontrolled because of this   Other Other (See Comments)    ALL DIABETIC MEDS PER PT    Pioglitazone Other (See Comments)    ALL DIABETIC MEDS PER PT  Other reaction(s): peripheral edema   Pravastatin     Other reaction(s): retain fld   Sitagliptin     ALL DIABETIC MEDS PER PT  Other reaction(s): headache   Spironolactone-Hctz Other (See Comments)   Statins     Myalgia   Sulfamethoxazole Other (See Comments)    ALL DIABETIC MEDS PER PT    Tramadol Hcl Itching   Acarbose Other (See Comments)   Atenolol Other (See Comments)   Atorvastatin Other (See Comments)   Atorvastatin Calcium     Other reaction(s): retain fld    Canagliflozin Other (See Comments)   Crestor [Rosuvastatin] Other (See Comments)   Empagliflozin Other (See Comments)   Glimepiride Other (See Comments)   Losartan Potassium Other (See Comments)   Metformin Other (See Comments)   Other Other (See Comments)   Pioglitazone Other (  See Comments)   Pravastatin Other (See Comments)   Rosuvastatin Calcium    Spironolactone-Hctz Other (See Comments)   Tramadol    Lasix [Furosemide] Rash    Sugars increased   Sulfa Antibiotics Rash and Other (See Comments)    Mother has told patient in the past not to take, she cannot recall there reaction     EKGs/Labs/Other Studies Reviewed:   The following studies were reviewed today:  Coronary CTA 05/02/2020 IMPRESSION: 1.  Three vessel mild non-obstructive CAD, CADRADS = 2.   2. Coronary calcium score of 879. This was 96th percentile for age and sex matched control.   3.  Normal coronary origin with right dominance.   4.  PFO present with evidence of left to right flow.    EKG:  EKG is not ordered today.    Recent Labs: 05/30/2022: Platelets 208.0 06/29/2022: Hemoglobin 11.6 12/07/2022: ALT 16; Magnesium 1.9; TSH 1.02 12/19/2022: BUN 22; Creatinine, Ser 1.16; Potassium 4.3; Pro B Natriuretic peptide (BNP) 92.0; Sodium 142  Recent Lipid Panel    Component Value Date/Time   CHOL 119 08/21/2022 1140   CHOL 134 08/16/2021 1018   TRIG 149.0 08/21/2022 1140   HDL 49.00 08/21/2022 1140   HDL 36 (L) 08/16/2021 1018   CHOLHDL 2 08/21/2022 1140   VLDL 29.8 08/21/2022 1140   LDLCALC 41 08/21/2022 1140   LDLCALC 69 08/16/2021 1018   LDLDIRECT 130 (H) 05/10/2020 1659    Home Medications   Current Meds  Medication Sig   acetaminophen (TYLENOL) 500 MG tablet Take 500 mg by mouth every 6 (six) hours as needed for moderate pain.   aspirin EC 325 MG tablet Take 1 tablet (325 mg total) by mouth daily.   Blood Glucose Monitoring Suppl (ACCU-CHEK GUIDE) w/Device KIT Use to test blood sugar 3 times  daily.   Cholecalciferol 25 MCG (1000 UT) capsule Take 1 capsule (1,000 Units total) by mouth daily.   cyanocobalamin (,VITAMIN B-12,) 1000 MCG/ML injection Inject 1,000 mcg into the muscle every 30 (thirty) days.    Evolocumab (REPATHA SURECLICK) 371 MG/ML SOAJ INJECT 1 PEN SUBCUTANEOUSLY EVERY 14 DAYS   glucose blood (ACCU-CHEK GUIDE) test strip Use to test blood sugar 3 times daily   Lancets Misc. (ACCU-CHEK FASTCLIX LANCET) KIT Use to check blood sugar 3 times daily   levothyroxine (SYNTHROID) 50 MCG tablet Take 1 tablet (50 mcg total) by mouth daily.   Multiple Vitamin (MULTIVITAMIN WITH MINERALS) TABS tablet Take 1 tablet by mouth daily.   pantoprazole (PROTONIX) 20 MG tablet Take 1 tablet (20 mg total) by mouth daily.   potassium chloride SA (KLOR-CON M) 20 MEQ tablet Take 1 tablet (20 mEq total) by mouth daily.   torsemide (DEMADEX) 20 MG tablet Take 1 tablet (20 mg total) by mouth daily.     Review of Systems      All other systems reviewed and are otherwise negative except as noted above.  Physical Exam    VS:  BP (!) 150/60   Pulse 93   Ht '5\' 2"'$  (1.575 m)   Wt 193 lb (87.5 kg)   SpO2 98%   BMI 35.30 kg/m  , BMI Body mass index is 35.3 kg/m.  Wt Readings from Last 3 Encounters:  01/07/23 193 lb (87.5 kg)  12/19/22 190 lb 8 oz (86.4 kg)  12/07/22 186 lb 2 oz (84.4 kg)     GEN: Well nourished, well developed, in no acute distress. HEENT: normal. Neck: Supple, no JVD, carotid  bruits, or masses. Cardiac: RRR, no murmurs, rubs, or gallops. No clubbing, cyanosis, edema.  Radials/PT 2+ and equal bilaterally.  Respiratory:  Respirations regular and unlabored, clear to auscultation bilaterally. GI: Soft, nontender, nondistended. MS: No deformity or atrophy. Skin: Warm and dry, no rash. Neuro:  Strength and sensation are intact. Psych: Normal affect.  Assessment & Plan    Chest pain -Comes and goes and does not seem to be exertional -We will order an echocardiogram  for further workup -I do not think a true ischemic workup is indicated at this time  Weight gain in the past 4-6 months -BNP normal so will not increase diuretics at this time -echo -will need a US of the neck updated (she says she has a doctor following her for this)  Hypertension -slightly elevated today -Continue current medication regimen for now -She will need to keep track of her blood pressure at home and if it remains elevated in please call and let us know -She has 35 medication allergies she is on med list prior to  Hyperlipidemia -LDL 41 -She will need a repeat panel in the fall     Disposition: Follow up 3 months with Larae Grooms, MD or APP.  Signed, Elgie Collard, PA-C 01/07/2023, 5:12 PM [facility]

## 2023-01-07 NOTE — Patient Instructions (Signed)
Medication Instructions:  Your physician recommends that you continue on your current medications as directed. Please refer to the Current Medication list given to you today.  *If you need a refill on your cardiac medications before your next appointment, please call your pharmacy*   Lab Work: None If you have labs (blood work) drawn today and your tests are completely normal, you will receive your results only by: [place] (if you have MyChart) OR A paper copy in the mail If you have any lab test that is abnormal or we need to change your treatment, we will call you to review the results.   Testing/Procedures: Your physician has requested that you have an echocardiogram. Echocardiography is a painless test that uses sound waves to create images of your heart. It provides your doctor with information about the size and shape of your heart and how well your heart's chambers and valves are working. This procedure takes approximately one hour. There are no restrictions for this procedure. Please do NOT wear cologne, perfume, aftershave, or lotions (deodorant is allowed). Please arrive 15 minutes prior to your appointment time.    Follow-Up: At [facility], you and your health needs are our priority.  As part of our continuing mission to provide you with exceptional heart care, we have created designated Provider Care Teams.  These Care Teams include your primary Cardiologist (physician) and Advanced Practice Providers (APPs -  Physician Assistants and Nurse Practitioners) who all work together to provide you with the care you need, when you need it.   Your next appointment:   1 year(s)  Provider:   Larae Grooms, MD    Other Instructions Talk to your primary care provider about having an ultrasound of your neck Memantine is the name of medication that was discussed today that you can talk to your primary care to about further

## 2023-01-15 ENCOUNTER — Telehealth: Payer: Self-pay | Admitting: Gastroenterology

## 2023-01-15 NOTE — Telephone Encounter (Signed)
Spoke with the patient. She states she is passing gas, but is unable to pass stool. She started taking Magnesium Sulfate tablets 2 days ago. She states this usually gives her diarrhea. Patient declines to go to the ED. States "my stomach hurts and feels hard." Appointment scheduled for tomorrow.  Is there anything she should do today?

## 2023-01-15 NOTE — Telephone Encounter (Signed)
Inbound call from patient stating that she has not used the bathroom in 2 weeks and she is in a lot of pain and is needing an appointment as soon as possible. Patient is requesting a call back to discuss. Please advise.

## 2023-01-16 ENCOUNTER — Other Ambulatory Visit: Payer: Self-pay | Admitting: Family Medicine

## 2023-01-16 ENCOUNTER — Ambulatory Visit (INDEPENDENT_AMBULATORY_CARE_PROVIDER_SITE_OTHER): Payer: 59 | Admitting: Physician Assistant

## 2023-01-16 ENCOUNTER — Encounter: Payer: Self-pay | Admitting: Physician Assistant

## 2023-01-16 ENCOUNTER — Ambulatory Visit (INDEPENDENT_AMBULATORY_CARE_PROVIDER_SITE_OTHER)
Admission: RE | Admit: 2023-01-16 | Discharge: 2023-01-16 | Disposition: A | Discharge status: Home / Self Care | Payer: 59 | Source: Ambulatory Visit | Attending: Physician Assistant | Admitting: Physician Assistant

## 2023-01-16 ENCOUNTER — Other Ambulatory Visit (INDEPENDENT_AMBULATORY_CARE_PROVIDER_SITE_OTHER): Payer: 59

## 2023-01-16 VITALS — BP 134/84 | HR 61 | Ht 62.0 in | Wt 191.0 lb

## 2023-01-16 DIAGNOSIS — E538 Deficiency of other specified B group vitamins: Secondary | ICD-10-CM

## 2023-01-16 DIAGNOSIS — D509 Iron deficiency anemia, unspecified: Secondary | ICD-10-CM | POA: Diagnosis not present

## 2023-01-16 DIAGNOSIS — K219 Gastro-esophageal reflux disease without esophagitis: Secondary | ICD-10-CM

## 2023-01-16 DIAGNOSIS — K581 Irritable bowel syndrome with constipation: Secondary | ICD-10-CM

## 2023-01-16 DIAGNOSIS — Z86010 Personal history of colonic polyps: Secondary | ICD-10-CM | POA: Diagnosis not present

## 2023-01-16 LAB — IBC + FERRITIN
Ferritin: 53.1 ng/mL (ref 10.0–291.0)
Iron: 73 ug/dL (ref 42–145)
Saturation Ratios: 17.7 % — ABNORMAL LOW (ref 20.0–50.0)
TIBC: 413 ug/dL (ref 250.0–450.0)
Transferrin: 295 mg/dL (ref 212.0–360.0)

## 2023-01-16 LAB — CBC WITH DIFFERENTIAL/PLATELET
Basophils Absolute: 0 10*3/uL (ref 0.0–0.1)
Basophils Relative: 0.6 % (ref 0.0–3.0)
Eosinophils Absolute: 0.1 10*3/uL (ref 0.0–0.7)
Eosinophils Relative: 2.6 % (ref 0.0–5.0)
HCT: 38.6 % (ref 36.0–46.0)
Hemoglobin: 13 g/dL (ref 12.0–15.0)
Lymphocytes Relative: 26.9 % (ref 12.0–46.0)
Lymphs Abs: 1.3 10*3/uL (ref 0.7–4.0)
MCHC: 33.6 g/dL (ref 30.0–36.0)
MCV: 88.9 fl (ref 78.0–100.0)
Monocytes Absolute: 0.4 10*3/uL (ref 0.1–1.0)
Monocytes Relative: 7.9 % (ref 3.0–12.0)
Neutro Abs: 3.1 10*3/uL (ref 1.4–7.7)
Neutrophils Relative %: 62 % (ref 43.0–77.0)
Platelets: 227 10*3/uL (ref 150.0–400.0)
RBC: 4.34 Mil/uL (ref 3.87–5.11)
RDW: 14.3 % (ref 11.5–15.5)
WBC: 4.9 10*3/uL (ref 4.0–10.5)

## 2023-01-16 LAB — COMPREHENSIVE METABOLIC PANEL
ALT: 12 U/L (ref 0–35)
AST: 18 U/L (ref 0–37)
Albumin: 4.3 g/dL (ref 3.5–5.2)
Alkaline Phosphatase: 49 U/L (ref 39–117)
BUN: 21 mg/dL (ref 6–23)
CO2: 27 mEq/L (ref 19–32)
Calcium: 9.7 mg/dL (ref 8.4–10.5)
Chloride: 103 mEq/L (ref 96–112)
Creatinine, Ser: 1.09 mg/dL (ref 0.40–1.20)
GFR: 50.48 mL/min — ABNORMAL LOW (ref >60.00)
Glucose, Bld: 161 mg/dL — ABNORMAL HIGH (ref 70–99)
Potassium: 3.7 mEq/L (ref 3.5–5.1)
Sodium: 139 mEq/L (ref 135–145)
Total Bilirubin: 0.6 mg/dL (ref 0.2–1.2)
Total Protein: 7.3 g/dL (ref 6.0–8.3)

## 2023-01-16 LAB — VITAMIN B12: Vitamin B-12: 602 pg/mL (ref 211–911)

## 2023-01-16 LAB — SEDIMENTATION RATE: Sed Rate: 30 mm/hr (ref 0–30)

## 2023-01-16 NOTE — Progress Notes (Signed)
01/16/2023 [name] [id number] 1951-11-20  Referring provider: [name], MD Primary GI doctor: [name]  ASSESSMENT AND PLAN:  Irritable bowel syndrome with constipation with LLQ pain, no BM x 2 weeks, passing gas Get Sed rate and CBC, if elevated can consider CT AB and pelvis but likely this is due to constipation/IBS Can do trial of IBGARD daily FODMAP, trial off lactulose and lifestyle changes discussed - linzess samples given 245 mcg, can do glycerin suppositories - instructions how to take given to patient in AVS -can take after Xray results are back to rule out obstruction Consider motegrity if not helping Possible pelvic floor component Some worsening drooling, resting tremor on exam may want evaluation with neurology for parkinson's though some of this can also be from stress of taking care of her husband with parkinson's  Gastroesophageal reflux disease, unspecified whether esophagitis present Lifestyle changes discussed, avoid NSAIDS, ETOH Continue on the same medication, reports symptoms are well controlled.   H/O adenomatous polyp of colon 05/2018 colonoscopy 2 small sessile polyps removed 1 tubular adenoma, diverticulosis internal hemorrhoid, recall 5 years. (05/2023)  SOB with dizziness, HD stable Will check iron/ferritin with dizziness and memory issues Can also be from possible cardiovascular, has some SOB, fluid overload, pending echocardiogram  B12 deficiency Has not had for at least 2 months Will recheck with memory issues   History of Present Illness:  72 y.o. female  with a past medical history of type 2 diabetes, GERD, anxiety, hypertension, hyperlipidemia hypothyroidism, obesity history of subarachnoid hemorrhage 2010, endometrial cancer 2021, anemia, IBS mixed constipation and diarrhea and others listed below, returns to clinic today for evaluation of IBS.  Status post hysterectomy, BSO and lymph node sampling 2021 stage I  endometrial cancer. 05/2018 colonoscopy 2 small sessile polyps removed 1 tubular adenoma, diverticulosis internal hemorrhoid, recall 5 years. (05/2023) 03/06/2021 Labs negative GI pathogen, O&P, fecal elastase 11/2021 CT ab and pelvis LLQ pain- tiny recal calculi, no metatic carcinoma, diverticulosi,  02/08/2022 hemoglobin 10.5, 4.0, MCV 89.6, platelets 268 05/30/2022 seen patient for worsening diarrhea, off amtitripyline and started on gabapentin, Cdiff negative, thyroid normal. Positive yersinia enterocolitica, given Cipro 500 mg twice daily for 5 days Felt better while on ABX 06/22/2022 office visit with myself for diarrhea then constipation leathers potential pelvic floor component did Cipro 10 days, consider Linzess At that time patient was having worsening loose stools could be up to 8-10 a day.   Was taken off amitriptyline and started on gabapentin, has had antibiotics in the last 6 months.  Was also complaining of increased stress and depression. Iron 41, ferritin 45, saturation ratio 10.4, hemoglobin 12 CRP unremarkable  C. difficile negative, thyroid normal Positive for  Zofran as needed for nausea She states while on the ABX she felt great  for those 5 days, less bloating, less issues.  06/22/2022 office visit with myself for 10-day of ciprofloxacin, KUB showed average stool burden, possible pelvic floor dysfunction with history of prolapse.  08/09/2022 office visit with [name] for IBS overall doing better with antibiotics and dietary change as BM every 1 to 3 days denies rectal bleeding.   Continued on dicyclomine, lactose-free diet 01/15/2023 phone call stating she had not had a bowel movement for 2 weeks with AB pain Presents here for follow-up.  States she has not had urge to have BM for years, has been worse the last few years.  She states can vary between 1 to 5 days without a BM,  then will have diarrhea when she does have a BM.  She has not had a BM for 2 weeks, had small  volume brown stool this AM.  Having associated left lower and left upper AB pain.  She has had some nausea, no vomiting.  States miralax does not help at all, tried senokot, MOM. Has not tried any enemas or suppositories.  Has been on magnesium citrate.  No blood in stool, no black stool.  She has been having more dizziness last few days, worse with standing.  She has had weight gain, has had more fluid retention and SOB, getting echocardiogram with cardiology. She has been having some chest pain, non exertional.   She is on B12 injections, has not had for at least 7 weeks. She is not on an iron supplement.  She is on her thyroid medication, last checked 12/22 and in normal range.  She found out she has macular degeneration, getting injections.  She states her short term memory has been worse in last 6 months, and worse last month.  She has had a lot of drooling, has slight resting tremor right hand.  She is driving, she does not do my chart.  Her daughter with mental health issues is living with her.   Wt Readings from Last 10 Encounters:  01/16/23 191 lb (86.6 kg)  01/07/23 193 lb (87.5 kg)  12/19/22 190 lb 8 oz (86.4 kg)  12/07/22 186 lb 2 oz (84.4 kg)  10/29/22 181 lb (82.1 kg)  10/24/22 182 lb (82.6 kg)  10/22/22 186 lb (84.4 kg)  10/17/22 189 lb (85.7 kg)  08/24/22 178 lb 6.6 oz (80.9 kg)  08/21/22 182 lb 6 oz (82.7 kg)     Current Medications:   Current Outpatient Medications (Endocrine & Metabolic):    levothyroxine (SYNTHROID) 50 MCG tablet, Take 1 tablet (50 mcg total) by mouth daily.  Current Outpatient Medications (Cardiovascular):    Evolocumab (REPATHA SURECLICK) 062 MG/ML SOAJ, INJECT 1 PEN SUBCUTANEOUSLY EVERY 14 DAYS   torsemide (DEMADEX) 20 MG tablet, Take 1 tablet (20 mg total) by mouth daily.   Current Outpatient Medications (Analgesics):    acetaminophen (TYLENOL) 500 MG tablet, Take 500 mg by mouth every 6 (six) hours as needed for moderate pain.    aspirin EC 325 MG tablet, Take 1 tablet (325 mg total) by mouth daily.  Current Outpatient Medications (Hematological):    cyanocobalamin (,VITAMIN B-12,) 1000 MCG/ML injection, Inject 1,000 mcg into the muscle every 30 (thirty) days.   Current Outpatient Medications (Other):    Blood Glucose Monitoring Suppl (ACCU-CHEK GUIDE) w/Device KIT, Use to test blood sugar 3 times daily.   Cholecalciferol 25 MCG (1000 UT) capsule, Take 1 capsule (1,000 Units total) by mouth daily.   glucose blood (ACCU-CHEK GUIDE) test strip, Use to test blood sugar 3 times daily   Lancets Misc. (ACCU-CHEK FASTCLIX LANCET) KIT, Use to check blood sugar 3 times daily   magnesium citrate SOLN, Take 1 Bottle by mouth once.   Multiple Vitamin (MULTIVITAMIN WITH MINERALS) TABS tablet, Take 1 tablet by mouth daily.   pantoprazole (PROTONIX) 20 MG tablet, Take 1 tablet (20 mg total) by mouth daily.   potassium chloride SA (KLOR-CON M) 20 MEQ tablet, Take 1 tablet (20 mEq total) by mouth daily.  Surgical History:  She  has a past surgical history that includes Dilation and curettage of uterus (1972); Cholecystectomy (1983); Hysteroscopy with D & C (06/29/2011); Colonoscopy (2008); Robotic assisted total hysterectomy with bilateral salpingo  oophorectomy (N/A, 10/27/2020); Sentinel node biopsy (N/A, 10/27/2020); and Cataract extraction (Right). Family History:  Her family history includes Anesthesia problems in her granddaughter, mother, and son; Arthritis in an other family member; Bladder Cancer in her father; Cerebrovascular Disease in her mother; Colon cancer in her father; Diabetes in an other family member; High blood pressure in an other family member; Liver cancer in her father; Schizophrenia in her child and daughter; Tremor in her child and son. Social History:   reports that she has never smoked. She has never used smokeless tobacco. She reports that she does not drink alcohol and does not use drugs.  Current  Medications, Allergies, Past Medical History, Past Surgical History, Family History and Social History were reviewed in [employer] record.  Physical Exam: BP 134/84   Pulse 61   Ht '5\' 2"'$  (1.575 m)   Wt 191 lb (86.6 kg)   BMI 34.93 kg/m  General:   Pleasant, well developed female in no acute distress Heart : Regular rate and rhythm, occ PVC,  holosystolic murmur harsh Pulm: Clear anteriorly; no wheezing Abdomen:  Soft, Obese AB, Active bowel sounds. mild tenderness in the LLQ. Without guarding and Without rebound, No organomegaly appreciated. Rectal: declines Extremities:  without  edema. Neurologic:  Alert and  oriented x4;  No focal deficits. Walking with cane, walks slowly, tremor left hand.  Psych:  Cooperative. Normal mood and affect.   Vladimir Crofts, PA-C 01/16/23

## 2023-01-16 NOTE — Patient Instructions (Addendum)
_______________________________________________________  If your blood pressure at your visit was 140/90 or greater, please contact your primary care physician to follow up on this.  _______________________________________________________  If you are age 74 or older, your body mass index should be between 23-30. Your Body mass index is 34.93 kg/m. If this is out of the aforementioned range listed, please consider follow up with your Primary Care Provider.  If you are age 22 or younger, your body mass index should be between 19-25. Your Body mass index is 34.93 kg/m. If this is out of the aformentioned range listed, please consider follow up with your Primary Care Provider.   ________________________________________________________  The [facility] providers would like to encourage you to use [facility] to communicate with providers for non-urgent requests or questions.  Due to long hold times on the telephone, sending your provider a message by [facility] may be a faster and more efficient way to get a response.  Please allow 48 business hours for a response.  Please remember that this is for non-urgent requests.  _______________________________________________________  Your provider has requested that you go to the basement level for lab work before leaving today. Press "B" on the elevator. The lab is located at the first door on the left as you exit the elevator.  Your provider has requested that you have an abdominal x ray before leaving today. Please go to the basement floor to our Radiology department for the test.  You have been scheduled for an appointment with [name] on 03-14-2023 at 2pm . Please arrive 10 minutes early for your appointment.   Get suppository like glycerin suppository or dulcolax tablets or suppository to try  Can start linzess AFTER xray results  Linzess 290 mcg samples lot [id number] exp 02-2024 2 boxes  *IBS-C patients may begin to experience relief from belly pain  and overall abdominal symptoms (pain, discomfort, and bloating) in about 1 week,  with symptoms typically improving over 12 weeks.  Take at least 30 minutes before the first meal of the day on an empty stomach You can have a loose stool if you eat a high-fat breakfast. Give it at least 7 days, may have more bowel movements during that time.   The diarrhea should go away and you should start having normal, complete, full bowel movements.  It may be helpful to start treatment when you can be near the comfort of your own bathroom, such as a weekend.  After you are out we can send in a prescription if you did well, there is a prescription card

## 2023-01-22 ENCOUNTER — Encounter: Payer: 59 | Admitting: Physical Medicine and Rehabilitation

## 2023-01-22 ENCOUNTER — Telehealth: Payer: Self-pay | Admitting: Physician Assistant

## 2023-01-22 NOTE — Telephone Encounter (Signed)
Patient returned your call, requesting a call back. Patient stated if you are not able to reach her, to leave a detailed voicemail. Please advise.

## 2023-01-22 NOTE — Telephone Encounter (Signed)
See result note.  

## 2023-01-23 LAB — BASIC METABOLIC PANEL
BUN: 19 (ref 4–21)
CO2: 24 — AB (ref 13–22)
Chloride: 106 (ref 99–108)
Creatinine: 1 (ref 0.5–1.1)
Glucose: 143
Potassium: 4.4 mEq/L (ref 3.5–5.1)
Sodium: 141 (ref 137–147)

## 2023-01-23 LAB — COMPREHENSIVE METABOLIC PANEL
Albumin: 4.1 (ref 3.5–5.0)
Calcium: 9.4 (ref 8.7–10.7)
eGFR: 62

## 2023-01-23 LAB — CBC AND DIFFERENTIAL
HCT: 36 (ref 36–46)
Hemoglobin: 12 (ref 12.0–16.0)
Neutrophils Absolute: 2.8
Platelets: 207 10*3/uL (ref 150–400)
WBC: 4.8

## 2023-01-23 LAB — MICROALBUMIN / CREATININE URINE RATIO: Microalb Creat Ratio: 207

## 2023-01-23 LAB — CBC: RBC: 4.05 (ref 3.87–5.11)

## 2023-01-23 LAB — PROTEIN / CREATININE RATIO, URINE
Albumin, U: 227.1
Creatinine, Urine: 109.5

## 2023-01-25 ENCOUNTER — Telehealth: Payer: Self-pay | Admitting: Physician Assistant

## 2023-01-25 MED ORDER — LINACLOTIDE 145 MCG PO CAPS: 145.0000 ug | ORAL_CAPSULE | Freq: Every day | ORAL | 3 refills | Status: DC

## 2023-01-25 NOTE — Telephone Encounter (Signed)
Inbound call from patient, states she was given Linzess samples at her past visit. Patient is requesting to reduce the dosage of Linzess due to having diarrhea. Please advise.

## 2023-01-25 NOTE — Telephone Encounter (Signed)
Pt was given Linzess 252mg and report she is having diarrhea. Requesting a lower dose. Please advise.

## 2023-01-25 NOTE — Telephone Encounter (Signed)
Patient called back, advised her that the prescription had been sent. Patient stated she has been having diarrhea, and wanted to know if 145 was the smallest dose. Please advise.

## 2023-01-25 NOTE — Telephone Encounter (Signed)
Spoke with pt and let her know that there is a 21mg dose but AEstill Bambergwanted to start her on 1470m and see how she does on that dose. Pt verbalized understanding and knows to call back if the 14552mseems to be to strong.

## 2023-01-25 NOTE — Telephone Encounter (Signed)
Prescription sent to pharmacy and message left for pt.

## 2023-02-01 ENCOUNTER — Ambulatory Visit (HOSPITAL_COMMUNITY): Payer: 59 | Attending: Cardiology

## 2023-02-01 DIAGNOSIS — R635 Abnormal weight gain: Secondary | ICD-10-CM | POA: Diagnosis present

## 2023-02-01 DIAGNOSIS — R609 Edema, unspecified: Secondary | ICD-10-CM | POA: Diagnosis not present

## 2023-02-01 DIAGNOSIS — R0602 Shortness of breath: Secondary | ICD-10-CM

## 2023-02-01 LAB — ECHOCARDIOGRAM COMPLETE
Area-P 1/2: 3.84 cm2
MV VTI: 1.22 cm2
S' Lateral: 3 cm

## 2023-02-06 ENCOUNTER — Ambulatory Visit (INDEPENDENT_AMBULATORY_CARE_PROVIDER_SITE_OTHER): Payer: 59 | Admitting: *Deleted

## 2023-02-06 DIAGNOSIS — E538 Deficiency of other specified B group vitamins: Secondary | ICD-10-CM | POA: Diagnosis not present

## 2023-02-06 MED ORDER — CYANOCOBALAMIN 1000 MCG/ML IJ SOLN
1000.0000 ug | Freq: Once | INTRAMUSCULAR | Status: AC
  Administered 2023-02-06: 1000 ug via INTRAMUSCULAR

## 2023-02-06 NOTE — Progress Notes (Signed)
Per orders of Dr. Cherlynn Kaiser, injection of B 12 given in right deltoid per patient preference by [name], CMA. Patient tolerated injection well. Patient reminded to schedule next injection.

## 2023-02-07 ENCOUNTER — Telehealth: Payer: Self-pay | Admitting: Interventional Cardiology

## 2023-02-07 DIAGNOSIS — I493 Ventricular premature depolarization: Secondary | ICD-10-CM

## 2023-02-07 DIAGNOSIS — R0602 Shortness of breath: Secondary | ICD-10-CM

## 2023-02-07 NOTE — Telephone Encounter (Signed)
Patient is calling bout her echo results. Please advise

## 2023-02-09 ENCOUNTER — Other Ambulatory Visit: Payer: Self-pay | Admitting: Physician Assistant

## 2023-02-12 ENCOUNTER — Encounter: Payer: Self-pay | Admitting: Physical Medicine and Rehabilitation

## 2023-02-12 ENCOUNTER — Encounter: Payer: 59 | Attending: Physical Medicine and Rehabilitation | Admitting: Physical Medicine and Rehabilitation

## 2023-02-12 VITALS — BP 160/81 | HR 91 | Ht 62.0 in | Wt 190.0 lb

## 2023-02-12 DIAGNOSIS — N8110 Cystocele, unspecified: Secondary | ICD-10-CM | POA: Insufficient documentation

## 2023-02-12 DIAGNOSIS — K5900 Constipation, unspecified: Secondary | ICD-10-CM | POA: Diagnosis present

## 2023-02-12 DIAGNOSIS — E7800 Pure hypercholesterolemia, unspecified: Secondary | ICD-10-CM

## 2023-02-12 DIAGNOSIS — F439 Reaction to severe stress, unspecified: Secondary | ICD-10-CM | POA: Insufficient documentation

## 2023-02-12 DIAGNOSIS — R410 Disorientation, unspecified: Secondary | ICD-10-CM | POA: Insufficient documentation

## 2023-02-12 DIAGNOSIS — Z87898 Personal history of other specified conditions: Secondary | ICD-10-CM | POA: Diagnosis present

## 2023-02-12 MED ORDER — LACTULOSE 20 GM/30ML PO SOLN: 30.0000 mL | Freq: Every day | ORAL | 3 refills | Status: DC | PRN

## 2023-02-12 NOTE — Progress Notes (Addendum)
Subjective:    Patient ID: [name], female    DOB: January 23, 1951, 72 y.o.   MRN: [id number]  HPI: [name] is a 72 y.o. female who returns for f/u appointment of her T7 Vertebral Fracture, Essential Hypertension, Neuropathic Pain and Dizziness.  She presented to [facility] on 01/30/2022 with complaints of pain after a fall, she was transferred to [facility].   Dr Lorin Mercy: H&P HPI: [name] is a 72 y.o. female with medical history significant of DM, HTN, dementia, and pernicious anemia presenting with a fall.  She has been sick for a long time.  She went off amitriptyline for migraines and was 50% less dizzy and so wasn't falling TID.  She took some brain herbal for 5 days and it started making her dizzy (less than amitriptyline).  She came into the house and tripped.   She tried to break her fall with her hand.  Both hands are hurting severely.  She hasn't been able to sleep off the amitriptyline.  She is hurting in her shoulders.  She saw a neurosurgeon 8 months ago and "a compressed something" and there was a question about FARS.  She has Parkinson's-type symptoms.  She is having tremors at baseline.  She thinks she is not a candidate for surgery - elderly, anesthesia complications, calcifications in her brain.  She has decided she wants to see someone in [place].  She just found out that she has macular degeneration She falls from dizziness She has had dizziness for a long time.  She has severe calcifications in the brain She has a lot of allergies to medications and dizziness.    CT Head: WO Contrast: CT Cervical Spine IMPRESSION: 1. No acute intracranial abnormality. 2. Small right frontal scalp hematoma without skull fracture. 3. No acute fracture or static subluxation of the cervical spine.  CT Thoracic Spine IMPRESSION: 1. No acute fracture or static subluxation of the thoracic spine. 2. Chronic compression deformity of L1.   Aortic  Atherosclerosis (ICD10-I70.0).    MR Cervical Spine IMPRESSION: 1. Mild T7 compression fracture with a small amount of bone marrow edema, compatible with acute to subacute injury. Minimal height loss. 2. Multilevel degenerative disc disease of the cervical spine with moderate C5-6 and mild C4-5 spinal canal stenosis. 3. Severe bilateral neural foraminal stenosis at C5-6.   MR Thoracic Spine:  IMPRESSION: 1. Mild T7 compression fracture with a small amount of bone marrow edema, compatible with acute to subacute injury. Minimal height loss. 2. Multilevel degenerative disc disease of the cervical spine with moderate C5-6 and mild C4-5 spinal canal stenosis. 3. Severe bilateral neural foraminal stenosis at C5-6.   DG: Left Hand   IMPRESSION: 1. Widening of the Scapholunate interval compatible with acute or chronic ligamentous injury which can predispose to SLAC wrist. 2. No acute fracture or dislocation identified about the left hand. 3. 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 4. Calcified peripheral vascular disease.  [name] was admitted to inpatient rehabilitation on 02/07/2022 and discharged home on 02/01/24/2022. She will be attending outpatient therapy.  She states she has pain in her neck radiating into her bilateral shoulders, left arm with tingling and lower back pain. She is walking with her walker.    She is walking 4,000 steps per day -she stopped the amitriptyline for migraines- no one told her to. This improved the dizziness by 50%. -she has had falls -does not not fele the room is  spinning -she feels dizzy and unbalanced.  -she uses her cane -usually does not use the walker -insomnia has improved  1) Constipation -had constant diarrhea and took magneisum citrate and now nothing is working -the gastro PA put her on Linzess, it caused more diarrhea for a few days and it now does nothing.   2) Prolapsed vagina -has had a hysterectomy due to  cancer and it was done by a robot, she had no pain from the procedures -she had a bad vaginal infection afterwards -she has a urology appointment tomorrow.    Pain Inventory Average Pain 5 Pain Right Now 5 My pain is intermittent, burning, dull, and tingling  In the last 24 hours, has pain interfered with the following? General activity 0 Relation with others 7 Enjoyment of life 8 What TIME of day is your pain at its worst? morning , daytime, evening, night, and varies Sleep (in general) Poor  Pain is worse with: unsure Pain improves with:  nothing Relief from Meds none  Family History  Problem Relation Age of Onset   Anesthesia problems Mother        hard to wake post op    Cerebrovascular Disease Mother        Never had stroke, but had CEA.   Liver cancer Father        mets to liver    Bladder Cancer Father    Colon cancer Father        mets to colon    Schizophrenia Daughter        Schizoaffective disorder   Anesthesia problems Son        stopped breathing post op    Tremor Son    Anesthesia problems Granddaughter        PONV   High blood pressure Other    Diabetes Other    Arthritis Other    Schizophrenia Child    Tremor Child    Dementia Neg Hx    Esophageal cancer Neg Hx    Rectal cancer Neg Hx    Stomach cancer Neg Hx    Colon polyps Neg Hx    Social History   Socioeconomic History   Marital status: Legally Separated    Spouse name: Not on file   Number of children: 3   Years of education: 12   Highest education level: Not on file  Occupational History   Occupation: Retired     Comment: nanny   Occupation: retired  Tobacco Use   Smoking status: Never   Smokeless tobacco: Never  Scientific laboratory technician Use: Never used  Substance and Sexual Activity   Alcohol use: Never    Comment: quit 1978   Drug use: Never   Sexual activity: Not Currently  Other Topics Concern   Not on file  Social History Narrative    Lives at home. Her granddaughter  lives with her and daughter    Right handed   Social Determinants of Health   Financial Resource Strain: Not on file  Food Insecurity: Not on file  Transportation Needs: Not on file  Physical Activity: Not on file  Stress: Not on file  Social Connections: Not on file   Past Surgical History:  Procedure Laterality Date   CATARACT EXTRACTION Right    CHOLECYSTECTOMY  1983   COLONOSCOPY  2008   [place] D & C  06/29/2011   Procedure: DILATATION AND  CURETTAGE (D&C) /HYSTEROSCOPY;  Surgeon: Donnamae Jude, MD;  Location: [facility];  Service: Gynecology;  Laterality: N/A;   ROBOTIC ASSISTED TOTAL HYSTERECTOMY WITH BILATERAL SALPINGO OOPHERECTOMY N/A 10/27/2020   Procedure: XI ROBOTIC ASSISTED TOTAL HYSTERECTOMY WITH BILATERAL SALPINGO OOPHORECTOMY;  Surgeon: Everitt Amber, MD;  Location: [facility];  Service: Gynecology;  Laterality: N/A;   SENTINEL NODE BIOPSY N/A 10/27/2020   Procedure: SENTINEL NODE BIOPSY;  Surgeon: Everitt Amber, MD;  Location: [facility];  Service: Gynecology;  Laterality: N/A;   Past Medical History:  Diagnosis Date   Allergy    Anemia    Arthritis    Asthma    Dianosed years ago-no meds at this time   Cancer [facility]) 2021   endometrial    Cataract    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 name of MD    Dementia ([place])    Diabetes mellitus    type 2    Diverticulosis    Dyslipidemia    Dyspnea    with exertion    Dysrhythmia    "skips beats"   Endometrial cancer ([place]) 2021   Family history of adverse reaction to anesthesia    son stopped breathing during surgery , mother slow to wake up    Fibromyalgia 1980's   Generalized anxiety disorder    GERD (gastroesophageal reflux disease)    occ- will use Tums and Prolosec  prn    Heart murmur    Hyperlipemia    Hypertension    not on  blood pressure meds due to allergies - last dose of meds- months ago    Hypothyroidism    Hypothyroidism (acquired)    Internal hemorrhoids    Irritable bowel syndrome 1980's   Liver cancer ([place])    Major depressive disorder    Migraine headaches 1980's   On meds-well controlled   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  Opioid Risk Score:   Fall Risk Score:  `1  Depression screen PHQ 2/9     10/22/2022    1:49 PM 07/24/2022    1:30 PM 03/05/2022    1:33 PM 09/21/2020    8:23 AM 09/01/2020    8:47 AM  Depression screen PHQ 2/9  Decreased Interest 1 0 1 1 0  Down, Depressed, Hopeless 1 0 0 1 0  PHQ - 2 Score 2 0 1 2 0  Altered sleeping   3 1 0  Tired, decreased energy   2 1 0  Change in appetite   0 0 0  Feeling bad or failure about yourself    0 0 0  Trouble concentrating   0 0 0  Moving slowly or fidgety/restless   0 0 0  Suicidal thoughts   0 0 0  PHQ-9 Score   6  4 0  Difficult doing work/chores   Not difficult at all       Review of Systems  Constitutional:  Positive for chills.  HENT: Negative.    Eyes: Negative.   Respiratory: Negative.    Cardiovascular: Negative.   Gastrointestinal:  Positive for diarrhea.  Endocrine: Negative.   Genitourinary:  Positive for urgency.  Musculoskeletal:  Positive for arthralgias, back pain, gait problem and neck pain.  Skin: Negative.   Allergic/Immunologic: Negative.   Neurological:  Positive for dizziness, tremors, weakness and numbness.  Hematological: Negative.    Psychiatric/Behavioral:  The patient is nervous/anxious.        Objective:   Physical Exam Vitals and nursing note reviewed. BMI 34.75, weight 190 lbs, BP 160/81 Constitutional:      Appearance: Normal appearance.  Cardiovascular:     Rate and Rhythm: Normal rate and regular rhythm.     Pulses: Normal pulses.     Heart sounds: Normal heart sounds.  Pulmonary:     Effort: Pulmonary effort is normal.     Breath sounds: Normal breath sounds.  Musculoskeletal:     Cervical back: Normal range of motion and neck supple.     Comments: Normal Muscle Bulk and Muscle Testing Reveals:  Upper Extremities: Full ROM and Muscle Strength 5/5 Lower Extremities: Full ROM and Muscle Strength 5/5 Arises from Table Slowly using walker for support Narrow Based  Gait  Ambulating with gait    Skin:    General: Skin is warm and dry.  Neurological:     General: No focal deficit present.     Mental Status: She is alert and oriented to person, place, and time.  Psychiatric:        Mood and Affect: Mood normal.        Behavior: Behavior normal. Verbose       Assessment & Plan:   1. T7 Vertebral Fracture: F/U with Dr. Kathyrn Sheriff. Continue outpatient therapy.  2.  Essential Hypertension,: Continue current medication regimen. PCP Following.   3.  Neuropathic Pain: Continue current medication regimen. Continue to monitor  4. Dizziness. PCP following. Continue to monitor. Continue to Monitor. Commended on stopping amitriptyline and gabapentin. Encouraged continued hydration.  -discussed vestibular eval -discussed that her brain calcifications could contribute Discussed extracorporeal shockwave therapy as a modality for treatment. Discussed that the device looks and feels like a massage gun and I would move it over the area of pain for about 10 minutes. The device releases sound waves to the area of pain and helps to improve blood flow and circulation to improve the healing process. Discuss that this  initially induces inflammation and can sometimes cause short-term increase in pain. Discussed that we typically do three weekly treatments, but sometimes up to 6 if needed, and after 6 weeks long term benefits can sometimes be achieved. Discussed that this is an FDA approved device, but not covered by insurance and would cost $60 per session. Will scheduled patient for 6 consecutive appointments and can cancel latter three if benefits are achieved after first three sessions.    5. Diarrhea: discussed how this had complicated her PT  6. Migraines: discussed her prior use of amitriptyline and gabapentin and how she stopped these due to her dizziness. Continue magnesium supplement -discussed Botox, Nurtec  7. Constipation:  -continue magnesium supplement.  -discussed that she had a tiny BM three days ago  -discussed that magnesium citrate was not beneficial -discussed that suppository was not effective -discussed lactulose, ordered -Provided  list of following foods that help with constipation and highlighted a few: 1) prunes- contain high amounts of fiber.  2) apples- has a form of dietary fiber called pectin that accelerates stool movement and increases beneficial gut bacteria 3) pears- in addition to fiber, also high in fructose and sorbitol which have laxative effect 4) figs- contain an enzyme ficin which helps to speed colonic transit 5) kiwis- contain an enzyme actinidin that improves gut motility and reduces constipation 6) oranges- rich in pectin (like apples) 7) grapefruits- contain a flavanol naringenin which has a laxative effect 8) vegetables- rich in fiber and also great sources of folate, vitamin C, and K 9) artichoke- high in inulin, prebiotic great for the microbiome 10) chicory- increases stool frequency and softness (can be added to coffee) 11) rhubarb- laxative effect 12) sweet potato- high fiber 13) beans, peas, and lentils- contain both soluble and insoluble fiber 14) chia  seeds- improves intestinal health and gut flora 15) flaxseeds- laxative effect 16) whole grain rye bread- high in fiber 17) oat bran- high in soluble and insoluble fiber 18) kefir- softens stools -recommended to try at least one of these foods every day.  -drink 6-8 glasses of water per day -walk regularly, especially after meals.   8) Insomnia: -Try to go outside near sunrise -Get exercise during the day. Discussed that her family doctor does not want her to use the treadmill due to fall risk.  -Turn off all devices an hour before bedtime.  -Teas that can benefit: chamomile, valerian root, Brahmi (Bacopa) -Can consider over the counter melatonin, magnesium, and/or L-theanine. Melatonin is an anti-oxidant with multiple health benefits. Magnesium is involved in greater than 300 enzymatic reactions in the body and most of [name] are deficient as our soil is often depleted. There are 7 different types of magnesium- Bioptemizer's is a supplement with all 7 types, and each has unique benefits. Magnesium can also help with constipation and anxiety.  -Pistachios naturally increase the production of melatonin -Cozy Earth bamboo bed sheets are free from toxic chemicals.  -Tart cherry juice or a tart cherry supplement can improve sleep and soreness post-workout  9) HLD -continue Repatha   10) Subacute confusion: -ammonia level ordered -discussed her history of fatty liver disease -lactulose ordered  11) Prolapsed vaginal wall: -follow-up with urology tomorrow  12) Stress -discussed family stressors.   13) History of diarrhea: -discussed that it got in the way of her therapy and she was taking 3-4 showers per day -discussed that she can try imodium daily prn if diarrhea resumes.

## 2023-02-12 NOTE — Patient Instructions (Signed)
Insomnia: -Try to go outside near sunrise -Get exercise during the day.  -Turn off all devices an hour before bedtime.  -Teas that can benefit: chamomile, valerian root, Brahmi (Bacopa) -Can consider over the counter melatonin, magnesium, and/or L-theanine. Melatonin is an anti-oxidant with multiple health benefits. Magnesium is involved in greater than 300 enzymatic reactions in the body and most of [name] are deficient as our soil is often depleted. There are 7 different types of magnesium- Bioptemizer's is a supplement with all 7 types, and each has unique benefits. Magnesium can also help with constipation and anxiety.  -Pistachios naturally increase the production of melatonin -Cozy Earth bamboo bed sheets are free from toxic chemicals.  -Tart cherry juice or a tart cherry supplement can improve sleep and soreness post-workout

## 2023-02-13 ENCOUNTER — Encounter: Payer: Self-pay | Admitting: Obstetrics and Gynecology

## 2023-02-13 ENCOUNTER — Ambulatory Visit (INDEPENDENT_AMBULATORY_CARE_PROVIDER_SITE_OTHER): Payer: 59 | Admitting: Obstetrics and Gynecology

## 2023-02-13 VITALS — BP 148/84 | HR 78

## 2023-02-13 DIAGNOSIS — N8110 Cystocele, unspecified: Secondary | ICD-10-CM

## 2023-02-13 DIAGNOSIS — N993 Prolapse of vaginal vault after hysterectomy: Secondary | ICD-10-CM

## 2023-02-13 LAB — AMMONIA: Ammonia: 26 ug/dL — ABNORMAL LOW (ref 31–169)

## 2023-02-13 MED ORDER — ESTRADIOL 0.1 MG/GM VA CREA: TOPICAL_CREAM | VAGINAL | 11 refills | Status: DC

## 2023-02-13 NOTE — Patient Instructions (Addendum)
Start vaginal estrogen therapy at night two times per week- can use a finger tip amount anywhere in the vagina for moisture.  Please let us know if the prescription is too expensive and we can look for alternative options.   You can also use coconut oil or vitamin E cream on the vagina and vulva as needed- can place anywhere that needs moisture.   The prolapse is not causing your constipation issues- only the bladder is prolapsing.   There is an option for a pessary to help lift the bladder. If you have this placed, you would come once every three months to have this cleaned in the office.

## 2023-02-13 NOTE — Progress Notes (Signed)
[facility] Return Visit  SUBJECTIVE  History of Present Illness: ORIE BRENNING is a 72 y.o. female seen in follow-up for prolapse  Has nausea, cramps, back pain and difficutly breathing at times. She has hardly been going to the bathroom for bowel movements at all. Had been taking magnesium and this causes her to have diarrhea but now not working as well. She was placed on Linzess and felt this was helping more but again stopped working. So she stopped the linzess about 3 days ago. She did have a small BM today. Prescribed lactulose by Dr Ranell Patrick yesterday but has not tried it yet. She is questioning if the prolapse is contributing to her constipation symptoms.   Feels more pressure in the vaginal area and the vagina falling out more. Overall she is not bothered by this but is concerned about the constipation. Used to use vaseline for vaginal moisture but not currently using anything.   She had a bubble test study scheduled for hole in her heart. Also noticed more issues with her memory.   Past Medical History: Patient  has a past medical history of Allergy, Anemia, Arthritis, Asthma, Cancer ([place]) (2021), Cataract, Chronic kidney disease (1997), Dementia ([place]), Diabetes mellitus, Diverticulosis, Dyslipidemia, Dyspnea, Dysrhythmia, Endometrial cancer ([place]) (2021), Family history of adverse reaction to anesthesia, Fibromyalgia (1980's), Generalized anxiety disorder, GERD (gastroesophageal reflux disease), Heart murmur, Hyperlipemia, Hypertension, Hypothyroidism, Hypothyroidism (acquired), Internal hemorrhoids, Irritable bowel syndrome (1980's), Liver cancer ([place]), Major depressive disorder, Migraine headaches (1980's), Mild neurocognitive disorder (10/05/2019), Morbid obesity ([place]), Pernicious anemia, Pneumonia (2009), PONV (postoperative nausea and vomiting), Recurrent upper respiratory infection (URI), Sleep apnea, Subarachnoid hemorrhage ([place]) (2010), and Vaginal polyp.   Past  Surgical History: She  has a past surgical history that includes Dilation and curettage of uterus (1972); Cholecystectomy (1983); Hysteroscopy with D & C (06/29/2011); Colonoscopy (2008); Robotic assisted total hysterectomy with bilateral salpingo oophorectomy (N/A, 10/27/2020); Sentinel node biopsy (N/A, 10/27/2020); and Cataract extraction (Right).   Medications: She has a current medication list which includes the following prescription(s): acetaminophen, aspirin ec, accu-chek guide, carbidopa-levodopa, cholecalciferol, cyanocobalamin, erythromycin, [START ON 02/14/2023] estradiol, repatha sureclick, accu-chek guide, lactulose, accu-chek fastclix lancet, levothyroxine, linaclotide, magnesium citrate, multivitamin with minerals, pantoprazole, potassium chloride sa, rifaximin, and torsemide.   Allergies: Patient is allergic to atenolol, atenolol, atorvastatin, canagliflozin, losartan potassium, acarbose, cinnamon, empagliflozin, glimepiride, metformin and related, other, pioglitazone, pravastatin, sitagliptin, spironolactone-hctz, statins, sulfamethoxazole, tramadol hcl, acarbose, atorvastatin, atorvastatin calcium, canagliflozin, crestor [rosuvastatin], empagliflozin, glimepiride, losartan potassium, metformin, other, pioglitazone, pravastatin, rosuvastatin calcium, spironolactone-hctz, tramadol, lasix [furosemide], and sulfa antibiotics.   Social History: Patient  reports that she has never smoked. She has never used smokeless tobacco. She reports that she does not drink alcohol and does not use drugs.      OBJECTIVE     Physical Exam: Vitals:   02/13/23 1236  BP: (!) 148/84  Pulse: 78   Gen: No apparent distress, A&O x 3.  Detailed Urogynecologic Evaluation:  Normal external genitalia. Speculum exam shows normal mucosa with atrophy.   POP-Q  2                                            Aa   2  Ba  -3.5                                               C   5                                            Gh  4                                            Pb  6.5                                            tvl   -2.5                                            Ap  -2.5                                            Bp                                                 D      ASSESSMENT AND PLAN    Ms. Eckelkamp is a 72 y.o. with:  1. Prolapse of anterior vaginal wall   2. Vaginal vault prolapse after hysterectomy    - We discussed that she has minimal posterior/ rectal prolapse so it is unlikely that her prolapse is contributing to her constipation. Recommended restarting the linzess and following up with GI.  - We again reviewed the option of a pessary for the prolapse. Overall she does not feel she wants treatment if it will not fix her constipation.  - For vaginal dryness, recommended estrace cream 0.5g two times a week at night. Also can use coconut oil or vitamin E cream on other days for moisture as needed.   Return 6 months for follow up  Jaquita Folds, MD

## 2023-02-15 ENCOUNTER — Telehealth: Payer: Self-pay

## 2023-02-15 NOTE — Telephone Encounter (Signed)
Called pt to advise

## 2023-02-18 ENCOUNTER — Encounter: Payer: 59 | Attending: Physical Medicine and Rehabilitation | Admitting: Physical Medicine and Rehabilitation

## 2023-02-18 DIAGNOSIS — K5900 Constipation, unspecified: Secondary | ICD-10-CM | POA: Insufficient documentation

## 2023-02-18 NOTE — Progress Notes (Signed)
Subjective:    Patient ID: [name], female    DOB: [birth year], 72 y.o.   MRN: [id number]  HPI: An audio/video tele-health visit is felt to be the most appropriate encounter for this patient at this time. This is a follow up tele-visit via phone. The patient is at home. MD is at office. Prior to scheduling this appointment, our staff discussed the limitations of evaluation and management by telemedicine and the availability of in-person appointments. The patient expressed understanding and agreed to proceed.    [name] is a 72 y.o. female who returns for f/u appointment of her [name] Vertebral Fracture, Essential Hypertension, Neuropathic Pain and Dizziness.  She presented to [name] on 01/30/2022 with complaints of pain after a fall, she was transferred to [name].   Dr [name]: H&P HPI: [name] is a 72 y.o. female with medical history significant of DM, HTN, dementia, and pernicious anemia presenting with a fall.  She has been sick for a long time.  She went off amitriptyline for migraines and was 50% less dizzy and so wasn't falling TID.  She took some brain herbal for 5 days and it started making her dizzy (less than amitriptyline).  She came into the house and tripped.   She tried to break her fall with her hand.  Both hands are hurting severely.  She hasn't been able to sleep off the amitriptyline.  She is hurting in her shoulders.  She saw a neurosurgeon 8 months ago and "a compressed something" and there was a question about FARS.  She has Parkinson's-type symptoms.  She is having tremors at baseline.  She thinks she is not a candidate for surgery - elderly, anesthesia complications, calcifications in her brain.  She has decided she wants to see someone in [place].  She just found out that she has macular degeneration She falls from dizziness She has had dizziness for a long time.  She has severe calcifications in the brain She has a lot of  allergies to medications and dizziness.    CT Head: WO Contrast: CT Cervical Spine IMPRESSION: 1. No acute intracranial abnormality. 2. Small right frontal scalp hematoma without skull fracture. 3. No acute fracture or static subluxation of the cervical spine.  CT Thoracic Spine IMPRESSION: 1. No acute fracture or static subluxation of the thoracic spine. 2. Chronic compression deformity of [name].   Aortic Atherosclerosis (ICD10-I70.0).    MR Cervical Spine IMPRESSION: 1. Mild [name] compression fracture with a small amount of bone marrow edema, compatible with acute to subacute injury. Minimal height loss. 2. Multilevel degenerative disc disease of the cervical spine with moderate C5-6 and mild C4-5 spinal canal stenosis. 3. Severe bilateral neural foraminal stenosis at C5-6.   MR Thoracic Spine:  IMPRESSION: 1. Mild [name] compression fracture with a small amount of bone marrow edema, compatible with acute to subacute injury. Minimal height loss. 2. Multilevel degenerative disc disease of the cervical spine with moderate C5-6 and mild C4-5 spinal canal stenosis. 3. Severe bilateral neural foraminal stenosis at C5-6.   DG: Left Hand   IMPRESSION: 1. Widening of the Scapholunate interval compatible with acute or chronic ligamentous injury which can predispose to SLAC wrist. 2. No acute fracture or dislocation identified about the left hand. 3. 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 4. Calcified peripheral vascular disease.  [name] was admitted to inpatient rehabilitation on 02/07/2022 and discharged home on 02/01/24/2022. She will be attending  outpatient therapy.  She states she has pain in her neck radiating into her bilateral shoulders, left arm with tingling and lower back pain. She is walking with her walker.    She is walking 4,000 steps per day -she stopped the amitriptyline for migraines- no one told her to. This improved the dizziness by 50%. -she has  had falls -does not not fele the room is spinning -she feels dizzy and unbalanced.  -she uses her cane -usually does not use the walker -insomnia has improved  1) Constipation -had constant diarrhea and took magneisum citrate and now nothing is working -the gastro PA put her on Linzess, it caused more diarrhea for a few days and it now does nothing.  -she tried the lactulose and this resulted in 2 small BPM -she had an XR in January and this did not show cancer recurrence -she does not think she can insert and enema herself  2) Prolapsed vagina -has had a hysterectomy due to cancer and it was done by a robot, she had no pain from the procedures -she had a bad vaginal infection afterwards -she has a urology appointment tomorrow.    Pain Inventory Average Pain 5 Pain Right Now 5 My pain is intermittent, burning, dull, and tingling  In the last 24 hours, has pain interfered with the following? General activity 0 Relation with others 7 Enjoyment of life 8 What TIME of day is your pain at its worst? morning , daytime, evening, night, and varies Sleep (in general) Poor  Pain is worse with: unsure Pain improves with:  nothing Relief from Meds none  Family History  Problem Relation Age of Onset   Anesthesia problems Mother        hard to wake post op    Cerebrovascular Disease Mother        Never had stroke, but had CEA.   Liver cancer Father        mets to liver    Bladder Cancer Father    Colon cancer Father        mets to colon    Schizophrenia Daughter        Schizoaffective disorder   Anesthesia problems Son        stopped breathing post op    Tremor Son    Anesthesia problems Granddaughter        PONV   High blood pressure Other    Diabetes Other    Arthritis Other    Schizophrenia Child    Tremor Child    Dementia Neg Hx    Esophageal cancer Neg Hx    Rectal cancer Neg Hx    Stomach cancer Neg Hx    Colon polyps Neg Hx    Social History    Socioeconomic History   Marital status: Legally Separated    Spouse name: Not on file   Number of children: 3   Years of education: 12   Highest education level: Not on file  Occupational History   Occupation: Retired     Comment: nanny   Occupation: retired  Tobacco Use   Smoking status: Never   Smokeless tobacco: Never  Scientific laboratory technician Use: Never used  Substance and Sexual Activity   Alcohol use: Never    Comment: quit 1978   Drug use: Never   Sexual activity: Not Currently  Other Topics Concern   Not on file  Social History Narrative    Lives at home. Her granddaughter lives with  her and daughter    Right handed   Social Determinants of Health   Financial Resource Strain: Not on file  Food Insecurity: Not on file  Transportation Needs: Not on file  Physical Activity: Not on file  Stress: Not on file  Social Connections: Not on file   Past Surgical History:  Procedure Laterality Date   CATARACT EXTRACTION Right    CHOLECYSTECTOMY  1983   COLONOSCOPY  2008   [place] D & C  06/29/2011   Procedure: DILATATION AND CURETTAGE (D&C) /HYSTEROSCOPY;  Surgeon: Donnamae Jude, MD;  Location: [facility];  Service: Gynecology;  Laterality: N/A;   ROBOTIC ASSISTED TOTAL HYSTERECTOMY WITH BILATERAL SALPINGO OOPHERECTOMY N/A 10/27/2020   Procedure: XI ROBOTIC ASSISTED TOTAL HYSTERECTOMY WITH BILATERAL SALPINGO OOPHORECTOMY;  Surgeon: Everitt Amber, MD;  Location: [facility];  Service: Gynecology;  Laterality: N/A;   SENTINEL NODE BIOPSY N/A 10/27/2020   Procedure: SENTINEL NODE BIOPSY;  Surgeon: Everitt Amber, MD;  Location: [facility];  Service: Gynecology;  Laterality: N/A;   Past Medical History:  Diagnosis Date   Allergy    Anemia    Arthritis    Asthma    Dianosed years ago-no meds at this time   Cancer [facility]) 2021   endometrial    Cataract    Right eye removed-still has left cataract    Chronic kidney disease 1997    Nepthrotic syndrom with minimal change-in remission per pt - last seen by kidney MD- 2 mos ago ? name of MD    Dementia ([place])    Diabetes mellitus    type 2    Diverticulosis    Dyslipidemia    Dyspnea    with exertion    Dysrhythmia    "skips beats"   Endometrial cancer ([place]) 2021   Family history of adverse reaction to anesthesia    son stopped breathing during surgery , mother slow to wake up    Fibromyalgia 1980's   Generalized anxiety disorder    GERD (gastroesophageal reflux disease)    occ- will use Tums and Prolosec  prn    Heart murmur    Hyperlipemia    Hypertension    not on blood pressure meds due to allergies - last dose of meds- months ago    Hypothyroidism    Hypothyroidism (acquired)    Internal hemorrhoids    Irritable bowel syndrome 1980's   Liver cancer ([place])    Major depressive disorder    Migraine headaches 1980's   On meds-well controlled   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  Opioid Risk Score:   Fall Risk Score:  `1  Depression screen PHQ 2/9     02/12/2023   12:44 PM 10/22/2022    1:49 PM 07/24/2022    1:30 PM 03/05/2022    1:33 PM 09/21/2020    8:23 AM 09/01/2020  8:47 AM  Depression screen PHQ 2/9  Decreased Interest 1 1 0 1 1 0  Down, Depressed, Hopeless 1 1 0 0 1 0  PHQ - 2 Score 2 2 0 1 2 0  Altered sleeping    3 1 0  Tired, decreased energy    2 1 0  Change in appetite    0 0 0  Feeling bad or failure about yourself     0 0 0  Trouble concentrating    0 0 0  Moving slowly or  fidgety/restless    0 0 0  Suicidal thoughts    0 0 0  PHQ-9 Score    6 4 0  Difficult doing work/chores    Not difficult at all       Review of Systems  Constitutional:  Positive for chills.  HENT: Negative.    Eyes: Negative.   Respiratory: Negative.    Cardiovascular: Negative.   Gastrointestinal:  Positive for diarrhea.  Endocrine: Negative.   Genitourinary:  Positive for urgency.  Musculoskeletal:  Positive for arthralgias, back pain, gait problem and neck pain.  Skin: Negative.   Allergic/Immunologic: Negative.   Neurological:  Positive for dizziness, tremors, weakness and numbness.  Hematological: Negative.   Psychiatric/Behavioral:  The patient is nervous/anxious.        Objective:   Physical Exam Vitals and nursing note reviewed. BMI 34.75, weight 190 lbs, BP 160/81 Constitutional:      Appearance: Normal appearance.  Cardiovascular:     Rate and Rhythm: Normal rate and regular rhythm.     Pulses: Normal pulses.     Heart sounds: Normal heart sounds.  Pulmonary:     Effort: Pulmonary effort is normal.     Breath sounds: Normal breath sounds.  Musculoskeletal:     Cervical back: Normal range of motion and neck supple.     Comments: Normal Muscle Bulk and Muscle Testing Reveals:  Upper Extremities: Full ROM and Muscle Strength 5/5 Lower Extremities: Full ROM and Muscle Strength 5/5 Arises from Table Slowly using walker for support Narrow Based  Gait  Ambulating with gait    Skin:    General: Skin is warm and dry.  Neurological:     General: No focal deficit present.     Mental Status: She is alert and oriented to person, place, and time.  Psychiatric:        Mood and Affect: Mood normal.        Behavior: Behavior normal. Verbose       Assessment & Plan:   1. [name] Vertebral Fracture: F/U with Dr. Kathyrn Sheriff. Continue outpatient therapy.  2.  Essential Hypertension,: Continue current medication regimen. PCP Following.   3.  Neuropathic Pain: Continue  current medication regimen. Continue to monitor  4. Dizziness. PCP following. Continue to monitor. Continue to Monitor. Commended on stopping amitriptyline and gabapentin. Encouraged continued hydration.  -discussed vestibular eval -discussed that her brain calcifications could contribute Discussed extracorporeal shockwave therapy as a modality for treatment. Discussed that the device looks and feels like a massage gun and I would move it over the area of pain for about 10 minutes. The device releases sound waves to the area of pain and helps to improve blood flow and circulation to improve the healing process. Discuss that this initially induces inflammation and can sometimes cause short-term increase in pain. Discussed that we typically do three weekly treatments, but sometimes up to 6 if needed, and after 6 weeks long term benefits  can sometimes be achieved. Discussed that this is an FDA approved device, but not covered by insurance and would cost $60 per session. Will scheduled patient for 6 consecutive appointments and can cancel latter three if benefits are achieved after first three sessions.    5. Diarrhea: discussed how this had complicated her PT  6. Migraines: discussed her prior use of amitriptyline and gabapentin and how she stopped these due to her dizziness. Continue magnesium supplement -discussed Botox, Nurtec  7. Constipation:  -continue magnesium supplement.  -discussed that response to lactulose was not complete, she only had 2 small BM with this, recommended going to urgent care/ER as she may need disimpaction/enema -discussed that she had a tiny BM three days ago  -discussed that magnesium citrate was not beneficial -discussed that suppository was not effective -discussed lactulose, ordered -Provided list of following foods that help with constipation and highlighted a few: 1) prunes- contain high amounts of fiber.  2) apples- has a form of dietary fiber called pectin that  accelerates stool movement and increases beneficial gut bacteria 3) pears- in addition to fiber, also high in fructose and sorbitol which have laxative effect 4) figs- contain an enzyme ficin which helps to speed colonic transit 5) kiwis- contain an enzyme actinidin that improves gut motility and reduces constipation 6) oranges- rich in pectin (like apples) 7) grapefruits- contain a flavanol naringenin which has a laxative effect 8) vegetables- rich in fiber and also great sources of folate, vitamin C, and K 9) artichoke- high in inulin, prebiotic great for the microbiome 10) chicory- increases stool frequency and softness (can be added to coffee) 11) rhubarb- laxative effect 12) sweet potato- high fiber 13) beans, peas, and lentils- contain both soluble and insoluble fiber 14) chia seeds- improves intestinal health and gut flora 15) flaxseeds- laxative effect 16) whole grain rye bread- high in fiber 17) oat bran- high in soluble and insoluble fiber 18) kefir- softens stools -recommended to try at least one of these foods every day.  -drink 6-8 glasses of water per day -walk regularly, especially after meals.   8) Insomnia: -Try to go outside near sunrise -Get exercise during the day. Discussed that her family doctor does not want her to use the treadmill due to fall risk.  -Turn off all devices an hour before bedtime.  -Teas that can benefit: chamomile, valerian root, Brahmi (Bacopa) -Can consider over the counter melatonin, magnesium, and/or L-theanine. Melatonin is an anti-oxidant with multiple health benefits. Magnesium is involved in greater than 300 enzymatic reactions in the body and most of [name] are deficient as our soil is often depleted. There are 7 different types of magnesium- Bioptemizer's is a supplement with all 7 types, and each has unique benefits. Magnesium can also help with constipation and anxiety.  -Pistachios naturally increase the production of melatonin -[employer]  bamboo bed sheets are free from toxic chemicals.  -Tart cherry juice or a tart cherry supplement can improve sleep and soreness post-workout  9) HLD -continue Repatha   10) Subacute confusion: -ammonia level ordered -discussed her history of fatty liver disease -lactulose ordered  11) Prolapsed vaginal wall: -follow-up with urology tomorrow  12) Stress -discussed family stressors.   13) History of diarrhea: -discussed that it got in the way of her therapy and she was taking 3-4 showers per day -discussed that she can try imodium daily prn if diarrhea resumes.   9 minutes spent in discussion that her response to Lactulose was not complete and that  she may need to go to to urgent care/ED for dismpaction/enema

## 2023-02-19 ENCOUNTER — Telehealth: Payer: Self-pay | Admitting: Physician Assistant

## 2023-02-19 DIAGNOSIS — K581 Irritable bowel syndrome with constipation: Secondary | ICD-10-CM

## 2023-02-19 MED ORDER — LINACLOTIDE 290 MCG PO CAPS: 290.0000 ug | ORAL_CAPSULE | Freq: Every day | ORAL | 0 refills | Status: DC

## 2023-02-19 NOTE — Telephone Encounter (Signed)
Left message for pt to call back  °

## 2023-02-19 NOTE — Telephone Encounter (Signed)
Pt states she is hardly passing any stool at all. Her PCP put her on Lactulose. Asked pt how many times/day she is taking it, report she has not taken it today. Discussed with her she can take it 2-3 times/day to have BM. Also asked pt if she was taking linzess. Reports she has not taken it today either. She reports the 129mg dose was not working well and she would like 2913m dose sent in for her Please advise.

## 2023-02-19 NOTE — Telephone Encounter (Signed)
Inbound call from patient, wishing to be seen sooner, states she is having severe constipation issues and does not wish to go to the emergency room. Wishes to speak to a nurse in regards to her symptoms. Please advise.

## 2023-02-19 NOTE — Telephone Encounter (Signed)
Sent in [place] 290 to her pharmacy to take. Go to the ER if unable to pass gas, severe AB pain, unable to hold down food, any shortness of breath of chest pain.  Keep appointment at the end of this month.

## 2023-02-19 NOTE — Telephone Encounter (Signed)
Left message for pt that the prescription has been sent to the pharmacy.

## 2023-02-21 ENCOUNTER — Encounter: Payer: Self-pay | Admitting: Family Medicine

## 2023-02-25 ENCOUNTER — Ambulatory Visit (INDEPENDENT_AMBULATORY_CARE_PROVIDER_SITE_OTHER): Payer: 59 | Admitting: Podiatry

## 2023-02-25 ENCOUNTER — Encounter: Payer: Self-pay | Admitting: Podiatry

## 2023-02-25 DIAGNOSIS — M79675 Pain in left toe(s): Secondary | ICD-10-CM | POA: Diagnosis not present

## 2023-02-25 DIAGNOSIS — M79674 Pain in right toe(s): Secondary | ICD-10-CM

## 2023-02-25 DIAGNOSIS — B351 Tinea unguium: Secondary | ICD-10-CM | POA: Diagnosis not present

## 2023-02-25 DIAGNOSIS — E1165 Type 2 diabetes mellitus with hyperglycemia: Secondary | ICD-10-CM | POA: Diagnosis not present

## 2023-02-26 ENCOUNTER — Telehealth: Payer: Self-pay | Admitting: Family Medicine

## 2023-02-26 ENCOUNTER — Ambulatory Visit (INDEPENDENT_AMBULATORY_CARE_PROVIDER_SITE_OTHER): Payer: 59 | Admitting: Family Medicine

## 2023-02-26 ENCOUNTER — Encounter: Payer: Self-pay | Admitting: Family Medicine

## 2023-02-26 VITALS — BP 130/78 | HR 103 | Temp 98.7°F | Ht 62.0 in | Wt 186.1 lb

## 2023-02-26 DIAGNOSIS — E119 Type 2 diabetes mellitus without complications: Secondary | ICD-10-CM | POA: Diagnosis not present

## 2023-02-26 DIAGNOSIS — K5904 Chronic idiopathic constipation: Secondary | ICD-10-CM

## 2023-02-26 DIAGNOSIS — K529 Noninfective gastroenteritis and colitis, unspecified: Secondary | ICD-10-CM | POA: Diagnosis not present

## 2023-02-26 DIAGNOSIS — R252 Cramp and spasm: Secondary | ICD-10-CM

## 2023-02-26 DIAGNOSIS — R1084 Generalized abdominal pain: Secondary | ICD-10-CM

## 2023-02-26 NOTE — Telephone Encounter (Signed)
Patient requests earlier appointment time-none available-Patient states she has low Potassium levels, stomach pain, cramping all over, heart skipping.  Transferred to Triage Gabriel Cirri)

## 2023-02-26 NOTE — Patient Instructions (Addendum)
ER - if worse.    Call tomorrow afternoon for lab results.

## 2023-02-26 NOTE — Progress Notes (Signed)
Subjective:     Patient ID: [name], female    DOB: 1951/07/27, 72 y.o.   MRN: [id number]  Chief Complaint  Patient presents with   Follow-up    Discuss medications and side effects Potassium levels low, stomach pain, cramping all over     HPI Cramping all over-feels K may be low.  Stomach pain.  Gets diarrhea a lot-has seen GI-"has never felt this bad".  Has 5 K in 24 hrs.   Constipation-chronic-on Linzess for few months.  Can't get in to GI till end of month.  Abd pain-bad.  Was taking MgCit, Lactulose-no success.  Then all 3 together and some diarrhea for few days(3-7 thru 3/9). So stopped Linzess and lactulose. Still a little diarrhea.  Heart Skipping intermitt. Has echo w/bubble test coming up.   Health Maintenance Due  Topic Date Due   Medicare Annual Wellness (AWV)  Never done   OPHTHALMOLOGY EXAM  Never done   HEMOGLOBIN A1C  02/19/2023    Past Medical History:  Diagnosis Date   Allergy    Anemia    Arthritis    Asthma    Dianosed years ago-no meds at this time   Cancer [facility]) 2021   endometrial    Cataract    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 name of MD    Dementia ([place])    Diabetes mellitus    type 2    Diverticulosis    Dyslipidemia    Dyspnea    with exertion    Dysrhythmia    "skips beats"   Endometrial cancer ([place]) 2021   Family history of adverse reaction to anesthesia    son stopped breathing during surgery , mother slow to wake up    Fibromyalgia 1980's   Generalized anxiety disorder    GERD (gastroesophageal reflux disease)    occ- will use Tums and Prolosec  prn    Heart murmur    Hyperlipemia    Hypertension    not on blood pressure meds due to allergies - last dose of meds- months ago    Hypothyroidism    Hypothyroidism (acquired)    Internal hemorrhoids    Irritable bowel syndrome  1980's   Liver cancer ([place])    Major depressive disorder    Migraine headaches 1980's   On meds-well controlled   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   Morbid obesity ([place])    Pernicious anemia    Pneumonia 2009   Several times over the past several years   PONV (postoperative nausea and vomiting)    was told by neurologist due to calcificaton in brain not to be put to sleep, N/V during Op and Recovery    Recurrent upper respiratory infection (URI)    Sleep apnea    no cpap   Subarachnoid hemorrhage ([place]) 2010   Vaginal polyp    benign per pt    Past Surgical History:  Procedure Laterality Date   CATARACT EXTRACTION Right    CHOLECYSTECTOMY  1983   COLONOSCOPY  2008   DB   DILATION AND CURETTAGE OF UTERUS  1972   HYSTEROSCOPY WITH D & C  06/29/2011   Procedure: DILATATION AND CURETTAGE (D&C) /HYSTEROSCOPY;  Surgeon: Donnamae Jude, MD;  Location: [facility];  Service: Gynecology;  Laterality: N/A;   ROBOTIC ASSISTED TOTAL HYSTERECTOMY  WITH BILATERAL SALPINGO OOPHERECTOMY N/A 10/27/2020   Procedure: XI ROBOTIC ASSISTED TOTAL HYSTERECTOMY WITH BILATERAL SALPINGO OOPHORECTOMY;  Surgeon: Everitt Amber, MD;  Location: [facility];  Service: Gynecology;  Laterality: N/A;   SENTINEL NODE BIOPSY N/A 10/27/2020   Procedure: SENTINEL NODE BIOPSY;  Surgeon: Everitt Amber, MD;  Location: [facility];  Service: Gynecology;  Laterality: N/A;    Outpatient Medications Prior to Visit  Medication Sig Dispense Refill   acetaminophen (TYLENOL) 500 MG tablet Take 500 mg by mouth every 6 (six) hours as needed for moderate pain.     aspirin EC 325 MG tablet Take 1 tablet (325 mg total) by mouth daily. 90 tablet 3   Blood Glucose Monitoring Suppl (ACCU-CHEK GUIDE) w/Device KIT Use to test blood sugar 3 times daily. 1 kit 0   carbidopa-levodopa (SINEMET IR) 25-100 MG tablet Take by mouth.     Cholecalciferol 25 MCG (1000 UT) capsule Take 1 capsule (1,000 Units total) by mouth daily. 90 capsule 3   cyanocobalamin  (,VITAMIN B-12,) 1000 MCG/ML injection Inject 1,000 mcg into the muscle every 30 (thirty) days.      erythromycin ophthalmic ointment SMARTSIG:Sparingly In Eye(s) Daily     estradiol (ESTRACE) 0.1 MG/GM vaginal cream Place 0.5g at night twice a week 30 g 11   Evolocumab (REPATHA SURECLICK) [id number] MG/ML SOAJ INJECT 1 PEN SUBCUTANEOUSLY EVERY 14 DAYS 6 mL 3   glucose blood (ACCU-CHEK GUIDE) test strip Use to test blood sugar 3 times daily 100 each 2   Lancets Misc. (ACCU-CHEK FASTCLIX LANCET) KIT Use to check blood sugar 3 times daily 1 kit 0   levothyroxine (SYNTHROID) 50 MCG tablet Take 1 tablet (50 mcg total) by mouth daily. 90 tablet 3   magnesium citrate SOLN Take 1 Bottle by mouth once.     Multiple Vitamin (MULTIVITAMIN WITH MINERALS) TABS tablet Take 1 tablet by mouth daily. 30 tablet 0   pantoprazole (PROTONIX) 20 MG tablet Take 1 tablet (20 mg total) by mouth daily. 90 tablet 3   potassium chloride SA (KLOR-CON M) 20 MEQ tablet Take 1 tablet (20 mEq total) by mouth daily. 90 tablet 1   rifaximin (XIFAXAN) 550 MG TABS tablet Take by mouth.     torsemide (DEMADEX) 20 MG tablet Take 1 tablet (20 mg total) by mouth daily. 90 tablet 1   Lactulose 20 GM/30ML SOLN Take 30 mLs (20 g total) by mouth daily as needed (severe constipation). 450 mL 3   linaclotide (LINZESS) 290 MCG CAPS capsule Take 1 capsule (290 mcg total) by mouth daily before breakfast. (Patient not taking: Reported on 02/26/2023) 90 capsule 0   No facility-administered medications prior to visit.    Allergies  Allergen Reactions   Atenolol     Stomach problems, chills   Atenolol Other (See Comments) and Shortness Of Breath   Atorvastatin     Other reaction(s): retain fld   Canagliflozin     ALL DIABETIC MEDS PER PT   abdominal pain, headache   Losartan Potassium     chills, HA, stomach pain   Acarbose Nausea Only     headache,nausea   Cinnamon     Stomach pain   Empagliflozin      headaches   Glimepiride     ALL  DIABETIC MEDS PER PT  abdominal pain, headache   Metformin And Related Nausea And Vomiting and Other (See Comments)    Patient states that she has severe chills, headache and cramping additionally. Says blood sugar is  uncontrolled because of this   Other Other (See Comments)    ALL DIABETIC MEDS PER PT    Pioglitazone Other (See Comments)    ALL DIABETIC MEDS PER PT  Other reaction(s): peripheral edema   Pravastatin     Other reaction(s): retain fld   Sitagliptin     ALL DIABETIC MEDS PER PT  Other reaction(s): headache   Spironolactone-Hctz Other (See Comments)   Statins     Myalgia   Sulfamethoxazole Other (See Comments)    ALL DIABETIC MEDS PER PT    Tramadol Hcl Itching   Acarbose Other (See Comments)    Headache   Atorvastatin Other (See Comments)   Atorvastatin Calcium     Other reaction(s): retain fld   Canagliflozin Other (See Comments)   Crestor [Rosuvastatin] Other (See Comments)   Empagliflozin Other (See Comments)   Glimepiride Other (See Comments)   Losartan Potassium Other (See Comments)   Metformin Other (See Comments)   Other Other (See Comments)   Pioglitazone Other (See Comments)   Pravastatin Other (See Comments)   Rosuvastatin Calcium    Spironolactone-Hctz Other (See Comments)   Tramadol    Lasix [Furosemide] Rash    Sugars increased   Sulfa Antibiotics Rash and Other (See Comments)    Mother has told patient in the past not to take, she cannot recall there reaction   ROS neg/noncontributory except as noted HPI/below      Objective:     BP 130/78   Pulse (!) 103   Temp 98.7 F (37.1 C) (Temporal)   Ht '5\' 2"'$  (1.575 m)   Wt 186 lb 2 oz (84.4 kg)   SpO2 96%   BMI 34.04 kg/m  Wt Readings from Last 3 Encounters:  02/26/23 186 lb 2 oz (84.4 kg)  02/12/23 190 lb (86.2 kg)  01/16/23 191 lb (86.6 kg)    Physical Exam   Gen: WDWN NAD HEENT: NCAT, conjunctiva not injected, sclera nonicteric NECK:  supple, no thyromegaly, no nodes, no  carotid bruits CARDIAC: tachy RRR, S1S2+,+2/6 murmur.  LUNGS: CTAB. No wheezes ABDOMEN:  BS+, soft, mod tender diffusely, No HSM, no masses EXT:  no edema MSK: no gross abnormalities.  NEURO: A&O x3.  CN II-XII intact.  PSYCH: normal mood. Good eye contact     Assessment & Plan:   Problem List Items Addressed This Visit       Digestive   Chronic diarrhea     Endocrine   Diabetes mellitus without complication ([place])   Other Visit Diagnoses     Generalized abdominal pain    -  Primary   Relevant Orders   Comprehensive metabolic panel   CBC with Differential/Platelet   Magnesium   CT Abdomen Pelvis W Contrast   Chronic idiopathic constipation       Cramps, muscle, general          Abd pain-diffuse.  H/o endomet ca, constipation/diarrhea-need to r/o mass compressing colon, other.  Declines er-advised sev times to go. Won't take any more linzess or lactulose.  Miralax doesn't work.  Usu, pt diarrhea. Check cmp,cbcd,mg, CT abd pelvis Cramps- pt took 5 K in 24 hrs-need to check electrolytes  No orders of the defined types were placed in this encounter.   Wellington Hampshire, MD

## 2023-02-26 NOTE — Telephone Encounter (Signed)
Abby states: - Spoke to patient but when it came to ED outcome, patient stated ED should not be advised during office hours and disconnected call.   Please Advise.

## 2023-02-26 NOTE — Telephone Encounter (Signed)
FYI

## 2023-02-27 ENCOUNTER — Other Ambulatory Visit: Payer: Self-pay | Admitting: *Deleted

## 2023-02-27 ENCOUNTER — Encounter: Payer: Self-pay | Admitting: Podiatry

## 2023-02-27 DIAGNOSIS — E876 Hypokalemia: Secondary | ICD-10-CM

## 2023-02-27 LAB — CBC WITH DIFFERENTIAL/PLATELET
Basophils Absolute: 0 10*3/uL (ref 0.0–0.1)
Basophils Relative: 0.5 % (ref 0.0–3.0)
Eosinophils Absolute: 0 10*3/uL (ref 0.0–0.7)
Eosinophils Relative: 0.4 % (ref 0.0–5.0)
HCT: 37.7 % (ref 36.0–46.0)
Hemoglobin: 12.8 g/dL (ref 12.0–15.0)
Lymphocytes Relative: 9.8 % — ABNORMAL LOW (ref 12.0–46.0)
Lymphs Abs: 0.8 10*3/uL (ref 0.7–4.0)
MCHC: 33.8 g/dL (ref 30.0–36.0)
MCV: 88.7 fl (ref 78.0–100.0)
Monocytes Absolute: 0.7 10*3/uL (ref 0.1–1.0)
Monocytes Relative: 8.1 % (ref 3.0–12.0)
Neutro Abs: 7 10*3/uL (ref 1.4–7.7)
Neutrophils Relative %: 81.2 % — ABNORMAL HIGH (ref 43.0–77.0)
Platelets: 238 10*3/uL (ref 150.0–400.0)
RBC: 4.25 Mil/uL (ref 3.87–5.11)
RDW: 14 % (ref 11.5–15.5)
WBC: 8.6 10*3/uL (ref 4.0–10.5)

## 2023-02-27 LAB — COMPREHENSIVE METABOLIC PANEL
ALT: 9 U/L (ref 0–35)
AST: 15 U/L (ref 0–37)
Albumin: 3.8 g/dL (ref 3.5–5.2)
Alkaline Phosphatase: 62 U/L (ref 39–117)
BUN: 18 mg/dL (ref 6–23)
CO2: 25 mEq/L (ref 19–32)
Calcium: 9.6 mg/dL (ref 8.4–10.5)
Chloride: 102 mEq/L (ref 96–112)
Creatinine, Ser: 1.08 mg/dL (ref 0.40–1.20)
GFR: 51 mL/min — ABNORMAL LOW (ref >60.00)
Glucose, Bld: 196 mg/dL — ABNORMAL HIGH (ref 70–99)
Potassium: 4.8 mEq/L (ref 3.5–5.1)
Sodium: 137 mEq/L (ref 135–145)
Total Bilirubin: 1 mg/dL (ref 0.2–1.2)
Total Protein: 6.7 g/dL (ref 6.0–8.3)

## 2023-02-27 LAB — MAGNESIUM: Magnesium: 1.8 mg/dL (ref 1.5–2.5)

## 2023-02-27 NOTE — Telephone Encounter (Signed)
Pt saw provider on 02/26/23  Patient Name: [name] Gender: Female DOB: 1951/07/16 Age: 72 Y 9 M 16 D Return Phone Number: [phone] (Primary) Address: City/ State/ Zip: [place]  [address] Client [facility] [facility] [place] [facility] Provider Cherlynn Kaiser, Ann Contact Type Call Who Is Calling Patient / Member / Family / Caregiver Call Type Triage / Clinical Relationship To Patient Self Return Phone Number [phone] (Primary) Chief Complaint Heart palpitations or irregular heartbeat Reason for Call Symptomatic / Request for [place] states that she has abdominal pain and feels like her heart is skipping, but her heart skips beats normally She believes that she has low potassium due to a medication that she was put on. . She does have an appt this afternoon at 4pm. Translation No Nurse Assessment Nurse: Lissa Merlin, RN, Abigail Date/Time (Eastern Time): 02/26/2023 10:49:21 AM Confirm and document reason for call. If symptomatic, describe symptoms. ---Caller states that she is having abdominal pain and her heart is skipping. States that she was given medicine to cause diarrhea. States that she is having generalized cramping. States that she intermittently has skipping heart beat. Does take potassium. Took 5 potassium pills in the past 24 hours. ALso has a headache. Does the patient have any new or worsening symptoms? ---Yes Will a triage be completed? ---Yes Related visit to physician within the last 2 weeks? ---Yes Does the PT have any chronic conditions? (i.e. diabetes, asthma, this includes High risk factors for pregnancy, etc.) ---Yes List chronic conditions. ---Kidney disease broken back Dizziness from brain calcification High cholesterol Is this a behavioral health or substance abuse call? ---No Guidelines Guideline Title Affirmed Question Affirmed Notes  Nurse Date/Time (Eastern Time) Heart Rate and Heartbeat Questions Dizziness, lightheadedness, or weakness Lissa Merlin, RN, Abigail 02/26/2023 10:56:41 AM Disp. Time Eilene Ghazi Time) Disposition Final User 02/26/2023 11:07:44 AM Go to ED Now Yes Lissa Merlin, RN, Abigail Final Disposition 02/26/2023 11:07:44 AM Go to ED Now Yes Lissa Merlin, RN, Leanne Lovely Disagree/Comply Disagree Caller Understands Yes PreDisposition InappropriateToAsk Care Advice Given Per Guideline GO TO ED NOW: * You need to be seen in the Emergency Department. * Go to the ED at ___________ [place] now. Drive carefully. CARE ADVICE given per Heart Rate and Heartbeat Questions (Adult) guideline. Referrals GO TO FACILITY REFUSED

## 2023-02-27 NOTE — Progress Notes (Signed)
Subjective:  Patient ID: [name], female    DOB: 02/16/1951,  MRN: [id number]  [name] presents to clinic today for at risk foot care. Pt has h/o NIDDM with chronic kidney disease  Chief Complaint  Patient presents with   Nail Problem    DFC BS-did not check today A1C-6.9 PCP-Kiul[facility] PCP VST-7 weeks ago   New problem(s): None.   PCP is [name], MD.  Allergies  Allergen Reactions   Atenolol     Stomach problems, chills   Atenolol Other (See Comments) and Shortness Of Breath   Atorvastatin     Other reaction(s): retain fld   Canagliflozin     ALL DIABETIC MEDS PER PT   abdominal pain, headache   Losartan Potassium     chills, HA, stomach pain   Acarbose Nausea Only     headache,nausea   Cinnamon     Stomach pain   Empagliflozin      headaches   Glimepiride     ALL DIABETIC MEDS PER PT  abdominal pain, headache   Metformin And Related Nausea And Vomiting and Other (See Comments)    Patient states that she has severe chills, headache and cramping additionally. Says blood sugar is uncontrolled because of this   Other Other (See Comments)    ALL DIABETIC MEDS PER PT    Pioglitazone Other (See Comments)    ALL DIABETIC MEDS PER PT  Other reaction(s): peripheral edema   Pravastatin     Other reaction(s): retain fld   Sitagliptin     ALL DIABETIC MEDS PER PT  Other reaction(s): headache   Spironolactone-Hctz Other (See Comments)   Statins     Myalgia   Sulfamethoxazole Other (See Comments)    ALL DIABETIC MEDS PER PT    Tramadol Hcl Itching   Acarbose Other (See Comments)    Headache   Atorvastatin Other (See Comments)   Atorvastatin Calcium     Other reaction(s): retain fld   Canagliflozin Other (See Comments)   Crestor [Rosuvastatin] Other (See Comments)   Empagliflozin Other (See Comments)   Glimepiride Other (See Comments)   Losartan Potassium Other (See Comments)   Metformin Other (See Comments)   Other Other (See Comments)    Pioglitazone Other (See Comments)   Pravastatin Other (See Comments)   Rosuvastatin Calcium    Spironolactone-Hctz Other (See Comments)   Tramadol    Lasix [Furosemide] Rash    Sugars increased   Sulfa Antibiotics Rash and Other (See Comments)    Mother has told patient in the past not to take, she cannot recall there reaction    Review of Systems: Negative except as noted in the HPI.  Objective: No changes noted in today's physical examination. There were no vitals filed for this visit.  [name] is a pleasant 72 y.o. female WD, WN in NAD. AAO x 3.  Vascular Examination: Capillary refill time immediate b/l. Vascular status intact b/l with palpable pedal pulses. Pedal hair present b/l. No edema. No pain with calf compression b/l. Skin temperature gradient WNL b/l.   Neurological Examination: Sensation grossly intact b/l with 10 gram monofilament. Vibratory sensation intact b/l.   Dermatological Examination: Pedal skin with normal turgor, texture and tone b/l.  No open wounds. No interdigital macerations.   Toenails 1-5 b/l thick, discolored, elongated with subungual debris and pain on dorsal palpation.   Hyperkeratotic lesion(s) bilateral great toes and 1st metatarsal head b/l lower extremities.  No erythema, no  edema, no drainage, no fluctuance.  Musculoskeletal Examination: Normal muscle strength 5/5 to all lower extremity muscle groups bilaterally. Limited joint ROM to the midfoot b/l. HAV with bunion deformity noted b/l LE.Marland Kitchen No pain, crepitus or joint limitation noted with ROM b/l LE.  Patient ambulates independently without assistive aids.  Radiographs: None  Last A1c:      Latest Ref Rng & Units 08/21/2022   11:40 AM  Hemoglobin A1C  Hemoglobin-A1c 4.6 - 6.5 % 6.9    Assessment/Plan: 1. Pain due to onychomycosis of toenails of both feet   2. Uncontrolled type 2 diabetes mellitus with hyperglycemia ([place])     -Patient was evaluated and treated. All  patient's and/or POA's questions/concerns answered on today's visit. -Patient declined paring of calluses; uses pumice stone periodically. -Continue foot and shoe inspections daily. Monitor blood glucose per PCP/Endocrinologist's recommendations. -Patient to continue soft, supportive shoe gear daily. -Toenails 1-5 b/l were debrided in length and girth with sterile nail nippers and dremel without iatrogenic bleeding.  -Patient/POA to call should there be question/concern in the interim.   Return in about 9 weeks (around 04/29/2023).  Marzetta Board, DPM

## 2023-02-27 NOTE — Progress Notes (Signed)
Labs fine except sugar.  Take the potassium 2/day.  Can repeat bmp in 1 wk to make sure that is enough

## 2023-02-27 NOTE — Telephone Encounter (Signed)
Patient seen on 02/26/23 for issue.

## 2023-03-04 ENCOUNTER — Ambulatory Visit
Admission: RE | Admit: 2023-03-04 | Discharge: 2023-03-04 | Disposition: A | Discharge status: Home / Self Care | Payer: 59 | Source: Ambulatory Visit | Attending: Family Medicine | Admitting: Family Medicine

## 2023-03-04 DIAGNOSIS — R1084 Generalized abdominal pain: Secondary | ICD-10-CM

## 2023-03-04 MED ORDER — IOPAMIDOL (ISOVUE-300) INJECTION 61%
100.0000 mL | Freq: Once | INTRAVENOUS | Status: AC | PRN
  Administered 2023-03-04: 100 mL via INTRAVENOUS

## 2023-03-05 ENCOUNTER — Encounter: Payer: Self-pay | Admitting: Family Medicine

## 2023-03-05 ENCOUNTER — Ambulatory Visit (INDEPENDENT_AMBULATORY_CARE_PROVIDER_SITE_OTHER): Payer: 59 | Admitting: Family Medicine

## 2023-03-05 VITALS — BP 130/80 | HR 91 | Temp 97.6°F | Ht 62.0 in | Wt 184.0 lb

## 2023-03-05 DIAGNOSIS — E538 Deficiency of other specified B group vitamins: Secondary | ICD-10-CM

## 2023-03-05 DIAGNOSIS — E876 Hypokalemia: Secondary | ICD-10-CM | POA: Diagnosis not present

## 2023-03-05 DIAGNOSIS — R5383 Other fatigue: Secondary | ICD-10-CM

## 2023-03-05 DIAGNOSIS — E1122 Type 2 diabetes mellitus with diabetic chronic kidney disease: Secondary | ICD-10-CM | POA: Diagnosis not present

## 2023-03-05 DIAGNOSIS — K5904 Chronic idiopathic constipation: Secondary | ICD-10-CM

## 2023-03-05 DIAGNOSIS — R1084 Generalized abdominal pain: Secondary | ICD-10-CM

## 2023-03-05 DIAGNOSIS — N1832 Chronic kidney disease, stage 3b: Secondary | ICD-10-CM

## 2023-03-05 DIAGNOSIS — R252 Cramp and spasm: Secondary | ICD-10-CM | POA: Diagnosis not present

## 2023-03-05 LAB — BASIC METABOLIC PANEL
BUN: 25 mg/dL — ABNORMAL HIGH (ref 6–23)
CO2: 27 mEq/L (ref 19–32)
Calcium: 9.5 mg/dL (ref 8.4–10.5)
Chloride: 104 mEq/L (ref 96–112)
Creatinine, Ser: 1.37 mg/dL — ABNORMAL HIGH (ref 0.40–1.20)
GFR: 38.34 mL/min — ABNORMAL LOW (ref >60.00)
Glucose, Bld: 162 mg/dL — ABNORMAL HIGH (ref 70–99)
Potassium: 4.6 mEq/L (ref 3.5–5.1)
Sodium: 140 mEq/L (ref 135–145)

## 2023-03-05 LAB — CK: Total CK: 45 U/L (ref 7–177)

## 2023-03-05 LAB — C-REACTIVE PROTEIN: CRP: <1 mg/dL (ref 0.5–20.0)

## 2023-03-05 LAB — HEMOGLOBIN A1C: Hgb A1c MFr Bld: 7.2 % — ABNORMAL HIGH (ref 4.6–6.5)

## 2023-03-05 MED ORDER — CYANOCOBALAMIN 1000 MCG/ML IJ SOLN
1000.0000 ug | Freq: Once | INTRAMUSCULAR | Status: AC
  Administered 2023-03-05: 1000 ug via INTRAMUSCULAR

## 2023-03-05 NOTE — Patient Instructions (Signed)
Get gas-X

## 2023-03-05 NOTE — Progress Notes (Signed)
Subjective:     Patient ID: [name], female    DOB: 1951-01-09, 72 y.o.   MRN: [id number]  Chief Complaint  Patient presents with   Follow-up    1 week follow-up     HPI  Cramping all over-legs, stomach,back.  Pt feels d/t low K.  Taking 2/day at same time.  Cramps any time of the day or night.   Diarrhea/constipation-some formed stools, little diarrhea.  Abd pain-did Ct yesterday-no results yet. A ot of "air" in stomach-rad to chest/back. Bloated, severe chills at times. Pressure and pain and continues to feel "rotten".  Passing gas ok.  Nausea + HA, fatigue since took Linzess-not taken in over 8 days . Macular degen-had one shot in eyes. Eyes hurt/burn/itch, no d/c.   Health Maintenance Due  Topic Date Due   Medicare Annual Wellness (AWV)  Never done   OPHTHALMOLOGY EXAM  Never done    Past Medical History:  Diagnosis Date   Allergy    Anemia    Arthritis    Asthma    Dianosed years ago-no meds at this time   Cancer [facility]) 2021   endometrial    Cataract    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 name of MD    Dementia ([place])    Diabetes mellitus    type 2    Diverticulosis    Dyslipidemia    Dyspnea    with exertion    Dysrhythmia    "skips beats"   Endometrial cancer ([place]) 2021   Family history of adverse reaction to anesthesia    son stopped breathing during surgery , mother slow to wake up    Fibromyalgia 1980's   Generalized anxiety disorder    GERD (gastroesophageal reflux disease)    occ- will use Tums and Prolosec  prn    Heart murmur    Hyperlipemia    Hypertension    not on blood pressure meds due to allergies - last dose of meds- months ago    Hypothyroidism    Hypothyroidism (acquired)    Internal hemorrhoids    Irritable bowel syndrome 1980's   Liver cancer ([place])    Major depressive disorder     Migraine headaches 1980's   On meds-well controlled   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   Morbid obesity ([place])    Pernicious anemia    Pneumonia 2009   Several times over the past several years   PONV (postoperative nausea and vomiting)    was told by neurologist due to calcificaton in brain not to be put to sleep, N/V during Op and Recovery    Recurrent upper respiratory infection (URI)    Sleep apnea    no cpap   Subarachnoid hemorrhage ([place]) 2010   Vaginal polyp    benign per pt    Past Surgical History:  Procedure Laterality Date   CATARACT EXTRACTION Right    CHOLECYSTECTOMY  1983   COLONOSCOPY  2008   DB   DILATION AND CURETTAGE OF UTERUS  1972   HYSTEROSCOPY WITH D & C  06/29/2011   Procedure: DILATATION AND CURETTAGE (D&C) /HYSTEROSCOPY;  Surgeon: Donnamae Jude, MD;  Location: [facility];  Service: Gynecology;  Laterality: N/A;   ROBOTIC ASSISTED TOTAL HYSTERECTOMY WITH BILATERAL SALPINGO OOPHERECTOMY N/A 10/27/2020   Procedure: XI ROBOTIC ASSISTED TOTAL  HYSTERECTOMY WITH BILATERAL SALPINGO OOPHORECTOMY;  Surgeon: Everitt Amber, MD;  Location: [facility];  Service: Gynecology;  Laterality: N/A;   SENTINEL NODE BIOPSY N/A 10/27/2020   Procedure: SENTINEL NODE BIOPSY;  Surgeon: Everitt Amber, MD;  Location: [facility];  Service: Gynecology;  Laterality: N/A;    Outpatient Medications Prior to Visit  Medication Sig Dispense Refill   acetaminophen (TYLENOL) 500 MG tablet Take 500 mg by mouth every 6 (six) hours as needed for moderate pain.     aspirin EC 325 MG tablet Take 1 tablet (325 mg total) by mouth daily. 90 tablet 3   Blood Glucose Monitoring Suppl (ACCU-CHEK GUIDE) w/Device KIT Use to test blood sugar 3 times daily. 1 kit 0   carbidopa-levodopa (SINEMET IR) 25-100 MG tablet Take by mouth.     Cholecalciferol 25 MCG (1000 UT) capsule Take 1 capsule (1,000 Units total) by mouth daily. 90 capsule 3   cyanocobalamin (,VITAMIN B-12,) 1000 MCG/ML injection Inject 1,000 mcg into  the muscle every 30 (thirty) days.      erythromycin ophthalmic ointment SMARTSIG:Sparingly In Eye(s) Daily     estradiol (ESTRACE) 0.1 MG/GM vaginal cream Place 0.5g at night twice a week 30 g 11   Evolocumab (REPATHA SURECLICK) XX123456 MG/ML SOAJ INJECT 1 PEN SUBCUTANEOUSLY EVERY 14 DAYS 6 mL 3   glucose blood (ACCU-CHEK GUIDE) test strip Use to test blood sugar 3 times daily 100 each 2   Lancets Misc. (ACCU-CHEK FASTCLIX LANCET) KIT Use to check blood sugar 3 times daily 1 kit 0   levothyroxine (SYNTHROID) 50 MCG tablet Take 1 tablet (50 mcg total) by mouth daily. 90 tablet 3   magnesium citrate SOLN Take 1 Bottle by mouth once.     Multiple Vitamin (MULTIVITAMIN WITH MINERALS) TABS tablet Take 1 tablet by mouth daily. 30 tablet 0   pantoprazole (PROTONIX) 20 MG tablet Take 1 tablet (20 mg total) by mouth daily. 90 tablet 3   potassium chloride SA (KLOR-CON M) 20 MEQ tablet Take 1 tablet (20 mEq total) by mouth daily. 90 tablet 1   rifaximin (XIFAXAN) 550 MG TABS tablet Take by mouth.     torsemide (DEMADEX) 20 MG tablet Take 1 tablet (20 mg total) by mouth daily. 90 tablet 1   No facility-administered medications prior to visit.    Allergies  Allergen Reactions   Atenolol     Stomach problems, chills   Atenolol Other (See Comments) and Shortness Of Breath   Atorvastatin     Other reaction(s): retain fld   Canagliflozin     ALL DIABETIC MEDS PER PT   abdominal pain, headache   Losartan Potassium     chills, HA, stomach pain   Acarbose Nausea Only     headache,nausea   Cinnamon     Stomach pain   Empagliflozin      headaches   Glimepiride     ALL DIABETIC MEDS PER PT  abdominal pain, headache   Metformin And Related Nausea And Vomiting and Other (See Comments)    Patient states that she has severe chills, headache and cramping additionally. Says blood sugar is uncontrolled because of this   Other Other (See Comments)    ALL DIABETIC MEDS PER PT    Pioglitazone Other (See  Comments)    ALL DIABETIC MEDS PER PT  Other reaction(s): peripheral edema   Pravastatin     Other reaction(s): retain fld   Sitagliptin     ALL DIABETIC MEDS PER PT  Other  reaction(s): headache   Spironolactone-Hctz Other (See Comments)   Statins     Myalgia   Sulfamethoxazole Other (See Comments)    ALL DIABETIC MEDS PER PT    Tramadol Hcl Itching   Acarbose Other (See Comments)    Headache   Atorvastatin Other (See Comments)   Atorvastatin Calcium     Other reaction(s): retain fld   Canagliflozin Other (See Comments)   Crestor [Rosuvastatin] Other (See Comments)   Empagliflozin Other (See Comments)   Glimepiride Other (See Comments)   Losartan Potassium Other (See Comments)   Metformin Other (See Comments)   Other Other (See Comments)   Pioglitazone Other (See Comments)   Pravastatin Other (See Comments)   Rosuvastatin Calcium    Spironolactone-Hctz Other (See Comments)   Tramadol    Lasix [Furosemide] Rash    Sugars increased   Sulfa Antibiotics Rash and Other (See Comments)    Mother has told patient in the past not to take, she cannot recall there reaction   ROS neg/noncontributory except as noted HPI/below      Objective:     BP 130/80   Pulse 91   Temp 97.6 F (36.4 C) (Temporal)   Ht 5\' 2"  (1.575 m)   Wt 184 lb (83.5 kg)   SpO2 99%   BMI 33.65 kg/m  Wt Readings from Last 3 Encounters:  03/05/23 184 lb (83.5 kg)  02/26/23 186 lb 2 oz (84.4 kg)  02/12/23 190 lb (86.2 kg)    Physical Exam   Gen: WDWN NAD HEENT: NCAT, conjunctiva not injected, sclera nonicteric NECK:  supple, no thyromegaly, no nodes, no carotid bruits CARDIAC: RRR, S1S2+, +2/6 murmur.  LUNGS: CTAB. No wheezes ABDOMEN:  BS+, soft, mildly tender diffusely.  Feels "gas" moving around on palp, No HSM, no masses EXT:  no edema MSK: no gross abnormalities.  NEURO: A&O x3.  CN II-XII intact.  PSYCH: normal mood. Good eye contact     Assessment & Plan:   Problem List Items  Addressed This Visit       Endocrine   Type 2 diabetes mellitus with stage 3 chronic kidney disease, without long-term current use of insulin (HCC)   Relevant Orders   Hemoglobin A1c (Completed)     Other   B12 deficiency - Primary   Other Visit Diagnoses     Cramps, muscle, general       Relevant Orders   CK (Completed)   C-reactive protein (Completed)   ANA   Low serum potassium       Generalized abdominal pain       Chronic idiopathic constipation       Other fatigue         1.  Diabetes type 2-chronic.  Probably not too well-controlled as she is intolerant of meds and having difficulty with her diet.  Check A1c 2.  Generalized muscle cramps-patient thinks this is due to low potassium.  There are times she is taking a lot of potassium and then the labs are okay.  Currently she is taking 2 a day.  Will recheck BMP.  Possibly cramps can be coming from nerves and back.  Also check CK, CRP, ANA 3.  B12 deficiency-chronic.  Patient getting monthly injections.  Got today 4.  Abdominal pain-had CT yesterday.  No reports yet.  Will check again later tonight/tomorrow. 5.  Chronic diarrhea/constipation-patient has been on multiple meds.  She ended up constipated, took Linzess and lactulose-and that seems to be was set off  the most recent episode of abdominal pain.  She is no longer taking those, but does not feel like she is improving.  She does see GI on 3/28 6.  Fatigue-chronic.  Not sure if medications, life stressors, other.  Nothing specific on lab studies.  Meds ordered this encounter  Medications   cyanocobalamin (VITAMIN B12) injection 1,000 mcg    Wellington Hampshire, MD

## 2023-03-06 ENCOUNTER — Other Ambulatory Visit: Payer: Self-pay | Admitting: *Deleted

## 2023-03-06 LAB — ANA: Anti Nuclear Antibody (ANA): NEGATIVE

## 2023-03-06 MED ORDER — AMOXICILLIN-POT CLAVULANATE 875-125 MG PO TABS: 1.0000 | ORAL_TABLET | Freq: Two times a day (BID) | ORAL | 0 refills | Status: DC

## 2023-03-06 NOTE — Progress Notes (Signed)
CT scan does show some diverticulitis.  Given all of her symptoms, we should probably treat this with antibiotics.  Please verify she does not have an allergy to penicillin.  If not, send in Augmentin 875 twice daily x 10 days.  She needs to the let GI know about this as well when she sees them on the 28th

## 2023-03-06 NOTE — Progress Notes (Signed)
1.  Kidney function has worsened a little bit-this could partly be due to dehydration and the CT.  She needs to make sure she is drinking plenty of fluids 2.  Diabetes is at the top end of where we want it.  Needs to try to do a little better on diet.  (A1c 7.2) 3.  Potassium is right where it should be.  Continue the same dose of potassium.  I do not think her cramps are coming from low potassium.  Muscle enzyme and inflammation are also negative.

## 2023-03-08 ENCOUNTER — Other Ambulatory Visit: Payer: Self-pay | Admitting: Family Medicine

## 2023-03-12 ENCOUNTER — Ambulatory Visit (HOSPITAL_COMMUNITY): Payer: 59 | Attending: Physician Assistant

## 2023-03-12 ENCOUNTER — Other Ambulatory Visit: Payer: Self-pay | Admitting: Physician Assistant

## 2023-03-12 DIAGNOSIS — I493 Ventricular premature depolarization: Secondary | ICD-10-CM | POA: Diagnosis not present

## 2023-03-12 DIAGNOSIS — R0602 Shortness of breath: Secondary | ICD-10-CM | POA: Insufficient documentation

## 2023-03-13 LAB — ECHOCARDIOGRAM LIMITED BUBBLE STUDY

## 2023-03-13 NOTE — Progress Notes (Unsigned)
03/14/2023 [name] [id number] 1951-01-06  Referring provider: [name], MD Primary GI doctor: [name]  ASSESSMENT AND PLAN:  Recent diverticulitis on CT 3/19 Treated with 10-day of Augmentin has some improvement in symptoms but continues to have pain.  Chills.  No rebound tenderness on exam. Will get sed rate, CBC, consider CT abdomen pelvis if these are elevated to rule out complications from diverticulitis but I think this is more IBS with constipation. Send 10 and extension of 5 days of Augmentin twice daily Patient states she does not feel constipated but can have days between bowel movements, last x-ray 1/31 showed moderate stool burden. Will repeat x-ray today to evaluate stool burden, did Linzess and lactulose with out significant results. Can consider Motegrity versus Trulance. FODMAP given. Will do trial of dicyclomine as needed for abdominal pain. Patient is due for colonoscopy 05/2023, discussed if she continues to have issues we will plan for scheduling repeat colonoscopy and endoscopy, declines scheduling today. Close follow-up 6 weeks, can decide on possible endoscopic evaluation if needed at that time. Consider SIBO testing/treatment.  Gastroesophageal reflux disease, unspecified whether esophagitis present Lifestyle changes discussed, avoid NSAIDS, ETOH Continue on the same medication, reports symptoms are well controlled.  Can consider EGD with colonoscopy pending results  H/O adenomatous polyp of colon 05/2018 colonoscopy 2 small sessile polyps removed 1 tubular adenoma, diverticulosis internal hemorrhoid, recall 5 years. (05/2023  B12 deficiency Stable   History of Present Illness:  72 y.o. female  with a past medical history of type 2 diabetes, GERD, anxiety, hypertension, hyperlipidemia hypothyroidism, obesity history of subarachnoid hemorrhage 2010, endometrial cancer 2021, anemia, IBS mixed constipation and diarrhea and others listed  below, returns to clinic today for evaluation of IBS.  Status post hysterectomy, BSO and lymph node sampling 2021 stage I endometrial cancer. 05/2018 colonoscopy 2 small sessile polyps removed 1 tubular adenoma, diverticulosis internal hemorrhoid, recall 5 years. (05/2023) 03/06/2021 Labs negative GI pathogen, O&P, fecal elastase 11/2021 CT ab and pelvis LLQ pain- tiny recal calculi, no metatic carcinoma, diverticulosi,  02/08/2022 hemoglobin 10.5, 4.0, MCV 89.6, platelets 268 05/30/2022 seen patient for worsening diarrhea, off amtitripyline and started on gabapentin, Cdiff negative, thyroid normal. Positive yersinia enterocolitica, given Cipro 500 mg twice daily for 5 days Felt better while on ABX 06/22/2022 office visit with myself for diarrhea then constipation leathers potential pelvic floor component did Cipro 10 days, consider Linzess Was taken off amitriptyline and started on gabapentin, Iron 41, ferritin 45, saturation ratio 10.4, hemoglobin 12 CRP unremarkable  C. difficile negative, thyroid normal Positive for  Zofran as needed for nausea  08/09/2022 office visit with [name] for IBS overall doing better with antibiotics and dietary change as BM every 1 to 3 days denies rectal bleeding.    Continued on dicyclomine, lactose-free diet.  01/16/2023 office visit with myself for constipation for 2 weeks She had normal iron, ferritin, B12 normal range.   Sed rate was unremarkable, no leukocytosis.  Kidney and liver normal 02/26/2023 office visit with PCP for 3 to 5 days of abdominal pain and diarrhea. 02/28/2022 CT abdomen pelvis shows diverticulitis without any complications.   Started on Augmentin for 10 days. She had hypokalemia and is now on medications.   She is on her last pill of augmentin.  She states she was having severe cramping, AB bloating and it was so bad she said she almost went to ER.  She is on her last dose of pill, continues to have  pain but it has made it 30-40 %  better.  She states she has more diarrhea than constipation but can go 1 to 5 days without a BM.  States she has not had urge to have BM for years, has been worse the last few years.  Having associated left lower and left upper AB pain.  Had small volume BM this AM, last one was a few days a go. The linzess and lactulose was not helping until suddenly she had fecal incontinence, watery stools, no stool in it.  That is when the severe cramping and diagnosed in the ER.  No blood in stool, no black stool.  She has been having more dizziness last few days, worse with standing.  She has had weight gain, has had more fluid retention and SOB, getting echocardiogram with cardiology. She has been having some chest pain, non exertional.  She is burping some and passing gas.  She has some recent GERD, and has orophangeal dysphagia, worse with liquids, can feel the go down the wrong way, coughing with eating/drinking.   03/12/2023 Echocardiogram bubble test negative, normal LVF, aortic valve not assessed.   She is driving, she does not do my chart.  Walks with cane, able to get on exam table with help.  Her daughter with mental health issues is living with her.   Wt Readings from Last 10 Encounters:  03/14/23 188 lb 6 oz (85.4 kg)  03/05/23 184 lb (83.5 kg)  02/26/23 186 lb 2 oz (84.4 kg)  02/12/23 190 lb (86.2 kg)  01/16/23 191 lb (86.6 kg)  01/07/23 193 lb (87.5 kg)  12/19/22 190 lb 8 oz (86.4 kg)  12/07/22 186 lb 2 oz (84.4 kg)  10/29/22 181 lb (82.1 kg)  10/24/22 182 lb (82.6 kg)     Current Medications:   Current Outpatient Medications (Endocrine & Metabolic):    levothyroxine (SYNTHROID) 50 MCG tablet, Take 1 tablet (50 mcg total) by mouth daily.  Current Outpatient Medications (Cardiovascular):    Evolocumab (REPATHA SURECLICK) XX123456 MG/ML SOAJ, INJECT 1 PEN SUBCUTANEOUSLY EVERY 14 DAYS   torsemide (DEMADEX) 20 MG tablet, Take 1 tablet (20 mg total) by mouth daily.   Current  Outpatient Medications (Analgesics):    acetaminophen (TYLENOL) 500 MG tablet, Take 500 mg by mouth every 6 (six) hours as needed for moderate pain.   aspirin EC 325 MG tablet, Take 1 tablet (325 mg total) by mouth daily.  Current Outpatient Medications (Hematological):    cyanocobalamin (,VITAMIN B-12,) 1000 MCG/ML injection, Inject 1,000 mcg into the muscle every 30 (thirty) days.   Current Outpatient Medications (Other):    amoxicillin-clavulanate (AUGMENTIN) 875-125 MG tablet, Take 1 tablet by mouth 2 (two) times daily for 10 days.   Blood Glucose Monitoring Suppl (ACCU-CHEK GUIDE) w/Device KIT, Use to test blood sugar 3 times daily.   carbidopa-levodopa (SINEMET IR) 25-100 MG tablet, Take by mouth.   Cholecalciferol 25 MCG (1000 UT) capsule, Take 1 capsule (1,000 Units total) by mouth daily.   erythromycin ophthalmic ointment, SMARTSIG:Sparingly In Eye(s) Daily   estradiol (ESTRACE) 0.1 MG/GM vaginal cream, Place 0.5g at night twice a week   glucose blood (ACCU-CHEK GUIDE) test strip, Use to test blood sugar 3 times daily   Lancets Misc. (ACCU-CHEK FASTCLIX LANCET) KIT, Use to check blood sugar 3 times daily   magnesium citrate SOLN, Take 1 Bottle by mouth once.   Multiple Vitamin (MULTIVITAMIN WITH MINERALS) TABS tablet, Take 1 tablet by mouth daily.   pantoprazole (  PROTONIX) 20 MG tablet, Take 1 tablet (20 mg total) by mouth daily.   potassium chloride SA (KLOR-CON M) 20 MEQ tablet, Take 1 tablet (20 mEq total) by mouth 2 (two) times daily.   rifaximin (XIFAXAN) 550 MG TABS tablet, Take by mouth.  Surgical History:  She  has a past surgical history that includes Dilation and curettage of uterus (1972); Cholecystectomy (1983); Hysteroscopy with D & C (06/29/2011); Colonoscopy (2008); Robotic assisted total hysterectomy with bilateral salpingo oophorectomy (N/A, 10/27/2020); Sentinel node biopsy (N/A, 10/27/2020); and Cataract extraction (Right). Family History:  Her family history  includes Anesthesia problems in her granddaughter, mother, and son; Arthritis in an other family member; Bladder Cancer in her father; Cerebrovascular Disease in her mother; Colon cancer in her father; Diabetes in an other family member; High blood pressure in an other family member; Liver cancer in her father; Schizophrenia in her child and daughter; Tremor in her child and son. Social History:   reports that she has never smoked. She has never used smokeless tobacco. She reports that she does not currently use alcohol. She reports that she does not use drugs.  Current Medications, Allergies, Past Medical History, Past Surgical History, Family History and Social History were reviewed in [employer] record.  Physical Exam: BP (!) 140/72   Pulse 94   Ht 5\' 2"  (1.575 m)   Wt 188 lb 6 oz (85.4 kg)   BMI 34.45 kg/m  General:   Pleasant, well developed female in no acute distress Heart : Regular rate and rhythm, occ PVC,  holosystolic murmur harsh Pulm: Clear anteriorly; no wheezing Abdomen:  Soft, Obese AB, Active bowel sounds. mild tenderness in the LLQ. Without guarding and Without rebound, No organomegaly appreciated. Rectal: declines Extremities:  without  edema. Neurologic:  Alert and  oriented x4;  No focal deficits. Walking with cane, walks slowly, tremor left hand.  Psych:  Cooperative. Normal mood and affect.   Vladimir Crofts, PA-C 03/14/23

## 2023-03-14 ENCOUNTER — Other Ambulatory Visit (INDEPENDENT_AMBULATORY_CARE_PROVIDER_SITE_OTHER): Payer: 59

## 2023-03-14 ENCOUNTER — Encounter: Payer: Self-pay | Admitting: Physician Assistant

## 2023-03-14 ENCOUNTER — Ambulatory Visit (INDEPENDENT_AMBULATORY_CARE_PROVIDER_SITE_OTHER): Payer: 59 | Admitting: Physician Assistant

## 2023-03-14 ENCOUNTER — Ambulatory Visit (INDEPENDENT_AMBULATORY_CARE_PROVIDER_SITE_OTHER)
Admission: RE | Admit: 2023-03-14 | Discharge: 2023-03-14 | Disposition: A | Discharge status: Home / Self Care | Payer: 59 | Source: Ambulatory Visit | Attending: Physician Assistant | Admitting: Physician Assistant

## 2023-03-14 VITALS — BP 140/72 | HR 94 | Ht 62.0 in | Wt 188.4 lb

## 2023-03-14 DIAGNOSIS — Z86010 Personal history of colonic polyps: Secondary | ICD-10-CM | POA: Diagnosis not present

## 2023-03-14 DIAGNOSIS — R1084 Generalized abdominal pain: Secondary | ICD-10-CM | POA: Diagnosis not present

## 2023-03-14 DIAGNOSIS — K581 Irritable bowel syndrome with constipation: Secondary | ICD-10-CM

## 2023-03-14 DIAGNOSIS — Z8719 Personal history of other diseases of the digestive system: Secondary | ICD-10-CM | POA: Diagnosis not present

## 2023-03-14 LAB — CBC WITH DIFFERENTIAL/PLATELET
Basophils Absolute: 0 10*3/uL (ref 0.0–0.1)
Basophils Relative: 0.7 % (ref 0.0–3.0)
Eosinophils Absolute: 0.1 10*3/uL (ref 0.0–0.7)
Eosinophils Relative: 2.2 % (ref 0.0–5.0)
HCT: 35.4 % — ABNORMAL LOW (ref 36.0–46.0)
Hemoglobin: 12 g/dL (ref 12.0–15.0)
Lymphocytes Relative: 22.7 % (ref 12.0–46.0)
Lymphs Abs: 1.1 10*3/uL (ref 0.7–4.0)
MCHC: 33.9 g/dL (ref 30.0–36.0)
MCV: 88.3 fl (ref 78.0–100.0)
Monocytes Absolute: 0.4 10*3/uL (ref 0.1–1.0)
Monocytes Relative: 7.1 % (ref 3.0–12.0)
Neutro Abs: 3.4 10*3/uL (ref 1.4–7.7)
Neutrophils Relative %: 67.3 % (ref 43.0–77.0)
Platelets: 240 10*3/uL (ref 150.0–400.0)
RBC: 4.01 Mil/uL (ref 3.87–5.11)
RDW: 13.6 % (ref 11.5–15.5)
WBC: 5 10*3/uL (ref 4.0–10.5)

## 2023-03-14 LAB — COMPREHENSIVE METABOLIC PANEL
ALT: 10 U/L (ref 0–35)
AST: 15 U/L (ref 0–37)
Albumin: 4 g/dL (ref 3.5–5.2)
Alkaline Phosphatase: 61 U/L (ref 39–117)
BUN: 21 mg/dL (ref 6–23)
CO2: 28 mEq/L (ref 19–32)
Calcium: 9.5 mg/dL (ref 8.4–10.5)
Chloride: 104 mEq/L (ref 96–112)
Creatinine, Ser: 1.08 mg/dL (ref 0.40–1.20)
GFR: 50.99 mL/min — ABNORMAL LOW (ref >60.00)
Glucose, Bld: 226 mg/dL — ABNORMAL HIGH (ref 70–99)
Potassium: 4 mEq/L (ref 3.5–5.1)
Sodium: 138 mEq/L (ref 135–145)
Total Bilirubin: 0.4 mg/dL (ref 0.2–1.2)
Total Protein: 7 g/dL (ref 6.0–8.3)

## 2023-03-14 LAB — SEDIMENTATION RATE: Sed Rate: 33 mm/hr — ABNORMAL HIGH (ref 0–30)

## 2023-03-14 MED ORDER — DICYCLOMINE HCL 20 MG PO TABS: 20.0000 mg | ORAL_TABLET | Freq: Three times a day (TID) | ORAL | 0 refills | Status: DC | PRN

## 2023-03-14 MED ORDER — PANTOPRAZOLE SODIUM 20 MG PO TBEC: 20.0000 mg | DELAYED_RELEASE_TABLET | Freq: Two times a day (BID) | ORAL | 3 refills | Status: DC

## 2023-03-14 MED ORDER — AMOXICILLIN-POT CLAVULANATE 875-125 MG PO TABS: 1.0000 | ORAL_TABLET | Freq: Two times a day (BID) | ORAL | 0 refills | Status: DC

## 2023-03-14 NOTE — Patient Instructions (Addendum)
   Your provider has requested that you go to the basement level for lab work before leaving today. Press "B" on the elevator. The lab is located at the first door on the left as you exit the elevator.  Your provider has requested that you have an abdominal x ray before leaving today. Please go to the basement floor to our Radiology department for the test.   You have been scheduled for an appointment with Vicie Mutters, PA on 05/06/23 at 1100 AM . Please arrive 10 minutes early for your appointment.   We have sent the following medications to your pharmacy for you to pick up at your convenience: Augmentin, bentyl, pantoprazole,   Will increase augmentin and given an additional 5 days Will check labs for inflammation and if still elevated suggest going to ER and or repeating CT AB and pelvis.  Add on florastor 250mg  twice a day for a month.  Increase the pantoprazole 20 mg to twice a day for GERD  Miralax is an osmotic laxative.  It only brings more water into the stool.  This is safe to take daily.  Can take up to 17 gram of miralax twice a day.  Mix with juice or coffee.  Start 1 capful at night for 3-4 days and reassess your response in 3-4 days.  You can increase and decrease the dose based on your response.  Remember, it can take up to 3-4 days to take effect OR for the effects to wear off.   I often pair this with benefiber in the morning to help assure the stool is not too loose.   Thank you for choosing me and [facility].  Vicie Mutters, PA-C

## 2023-03-18 ENCOUNTER — Other Ambulatory Visit: Payer: Self-pay

## 2023-03-18 DIAGNOSIS — R109 Unspecified abdominal pain: Secondary | ICD-10-CM

## 2023-03-18 DIAGNOSIS — Z8719 Personal history of other diseases of the digestive system: Secondary | ICD-10-CM

## 2023-03-18 MED ORDER — TRULANCE 3 MG PO TABS: 3.0000 mg | ORAL_TABLET | Freq: Every day | ORAL | 3 refills | Status: DC

## 2023-03-21 ENCOUNTER — Ambulatory Visit
Admission: RE | Admit: 2023-03-21 | Discharge: 2023-03-21 | Disposition: A | Discharge status: Home / Self Care | Payer: 59 | Source: Ambulatory Visit | Attending: Physician Assistant | Admitting: Physician Assistant

## 2023-03-21 DIAGNOSIS — R109 Unspecified abdominal pain: Secondary | ICD-10-CM

## 2023-03-21 DIAGNOSIS — I2510 Atherosclerotic heart disease of native coronary artery without angina pectoris: Secondary | ICD-10-CM | POA: Diagnosis not present

## 2023-03-21 DIAGNOSIS — Z8719 Personal history of other diseases of the digestive system: Secondary | ICD-10-CM

## 2023-03-21 DIAGNOSIS — I700 Atherosclerosis of aorta: Secondary | ICD-10-CM | POA: Diagnosis not present

## 2023-03-21 DIAGNOSIS — K760 Fatty (change of) liver, not elsewhere classified: Secondary | ICD-10-CM | POA: Diagnosis not present

## 2023-03-21 DIAGNOSIS — K5730 Diverticulosis of large intestine without perforation or abscess without bleeding: Secondary | ICD-10-CM | POA: Diagnosis not present

## 2023-03-21 MED ORDER — IOPAMIDOL (ISOVUE-300) INJECTION 61%
100.0000 mL | Freq: Once | INTRAVENOUS | Status: AC | PRN
  Administered 2023-03-21: 100 mL via INTRAVENOUS

## 2023-03-26 ENCOUNTER — Inpatient Hospital Stay (HOSPITAL_COMMUNITY)
Admission: EM | Admit: 2023-03-26 | Discharge: 2023-03-28 | DRG: 442 | Disposition: A | Discharge status: Home / Self Care | Payer: 59 | Source: Ambulatory Visit | Attending: Internal Medicine | Admitting: Internal Medicine

## 2023-03-26 ENCOUNTER — Encounter: Payer: 59 | Admitting: Physical Medicine and Rehabilitation

## 2023-03-26 ENCOUNTER — Other Ambulatory Visit: Payer: Self-pay

## 2023-03-26 ENCOUNTER — Emergency Department (HOSPITAL_COMMUNITY): Payer: 59

## 2023-03-26 DIAGNOSIS — G8929 Other chronic pain: Secondary | ICD-10-CM | POA: Diagnosis present

## 2023-03-26 DIAGNOSIS — Z885 Allergy status to narcotic agent status: Secondary | ICD-10-CM

## 2023-03-26 DIAGNOSIS — Z8505 Personal history of malignant neoplasm of liver: Secondary | ICD-10-CM

## 2023-03-26 DIAGNOSIS — R109 Unspecified abdominal pain: Secondary | ICD-10-CM | POA: Diagnosis not present

## 2023-03-26 DIAGNOSIS — K219 Gastro-esophageal reflux disease without esophagitis: Secondary | ICD-10-CM | POA: Diagnosis present

## 2023-03-26 DIAGNOSIS — Z818 Family history of other mental and behavioral disorders: Secondary | ICD-10-CM

## 2023-03-26 DIAGNOSIS — Z79620 Long term (current) use of immunosuppressive biologic: Secondary | ICD-10-CM

## 2023-03-26 DIAGNOSIS — K5732 Diverticulitis of large intestine without perforation or abscess without bleeding: Secondary | ICD-10-CM | POA: Diagnosis present

## 2023-03-26 DIAGNOSIS — Z79899 Other long term (current) drug therapy: Secondary | ICD-10-CM

## 2023-03-26 DIAGNOSIS — I130 Hypertensive heart and chronic kidney disease with heart failure and stage 1 through stage 4 chronic kidney disease, or unspecified chronic kidney disease: Secondary | ICD-10-CM | POA: Diagnosis present

## 2023-03-26 DIAGNOSIS — Z8701 Personal history of pneumonia (recurrent): Secondary | ICD-10-CM

## 2023-03-26 DIAGNOSIS — N183 Chronic kidney disease, stage 3 unspecified: Secondary | ICD-10-CM | POA: Diagnosis not present

## 2023-03-26 DIAGNOSIS — M797 Fibromyalgia: Secondary | ICD-10-CM | POA: Diagnosis present

## 2023-03-26 DIAGNOSIS — E860 Dehydration: Secondary | ICD-10-CM | POA: Diagnosis present

## 2023-03-26 DIAGNOSIS — Z8249 Family history of ischemic heart disease and other diseases of the circulatory system: Secondary | ICD-10-CM

## 2023-03-26 DIAGNOSIS — E871 Hypo-osmolality and hyponatremia: Secondary | ICD-10-CM | POA: Diagnosis present

## 2023-03-26 DIAGNOSIS — Z79890 Hormone replacement therapy (postmenopausal): Secondary | ICD-10-CM

## 2023-03-26 DIAGNOSIS — F0390 Unspecified dementia without behavioral disturbance: Secondary | ICD-10-CM | POA: Diagnosis present

## 2023-03-26 DIAGNOSIS — E119 Type 2 diabetes mellitus without complications: Secondary | ICD-10-CM | POA: Diagnosis not present

## 2023-03-26 DIAGNOSIS — G250 Essential tremor: Secondary | ICD-10-CM | POA: Diagnosis not present

## 2023-03-26 DIAGNOSIS — N1831 Chronic kidney disease, stage 3a: Secondary | ICD-10-CM | POA: Diagnosis present

## 2023-03-26 DIAGNOSIS — E876 Hypokalemia: Secondary | ICD-10-CM | POA: Diagnosis present

## 2023-03-26 DIAGNOSIS — R7989 Other specified abnormal findings of blood chemistry: Secondary | ICD-10-CM | POA: Diagnosis not present

## 2023-03-26 DIAGNOSIS — L299 Pruritus, unspecified: Secondary | ICD-10-CM | POA: Diagnosis present

## 2023-03-26 DIAGNOSIS — K581 Irritable bowel syndrome with constipation: Secondary | ICD-10-CM | POA: Diagnosis not present

## 2023-03-26 DIAGNOSIS — E1165 Type 2 diabetes mellitus with hyperglycemia: Secondary | ICD-10-CM | POA: Diagnosis not present

## 2023-03-26 DIAGNOSIS — K529 Noninfective gastroenteritis and colitis, unspecified: Secondary | ICD-10-CM | POA: Diagnosis not present

## 2023-03-26 DIAGNOSIS — E1122 Type 2 diabetes mellitus with diabetic chronic kidney disease: Secondary | ICD-10-CM | POA: Diagnosis not present

## 2023-03-26 DIAGNOSIS — E861 Hypovolemia: Secondary | ICD-10-CM | POA: Diagnosis present

## 2023-03-26 DIAGNOSIS — Z792 Long term (current) use of antibiotics: Secondary | ICD-10-CM

## 2023-03-26 DIAGNOSIS — K838 Other specified diseases of biliary tract: Secondary | ICD-10-CM | POA: Diagnosis not present

## 2023-03-26 DIAGNOSIS — E785 Hyperlipidemia, unspecified: Secondary | ICD-10-CM | POA: Diagnosis not present

## 2023-03-26 DIAGNOSIS — Z881 Allergy status to other antibiotic agents status: Secondary | ICD-10-CM

## 2023-03-26 DIAGNOSIS — Z800 Family history of malignant neoplasm of digestive organs: Secondary | ICD-10-CM

## 2023-03-26 DIAGNOSIS — N179 Acute kidney failure, unspecified: Secondary | ICD-10-CM | POA: Diagnosis present

## 2023-03-26 DIAGNOSIS — I700 Atherosclerosis of aorta: Secondary | ICD-10-CM | POA: Diagnosis not present

## 2023-03-26 DIAGNOSIS — J45909 Unspecified asthma, uncomplicated: Secondary | ICD-10-CM | POA: Diagnosis not present

## 2023-03-26 DIAGNOSIS — I5032 Chronic diastolic (congestive) heart failure: Secondary | ICD-10-CM | POA: Diagnosis not present

## 2023-03-26 DIAGNOSIS — R17 Unspecified jaundice: Secondary | ICD-10-CM | POA: Diagnosis present

## 2023-03-26 DIAGNOSIS — Z8542 Personal history of malignant neoplasm of other parts of uterus: Secondary | ICD-10-CM

## 2023-03-26 DIAGNOSIS — Z888 Allergy status to other drugs, medicaments and biological substances status: Secondary | ICD-10-CM

## 2023-03-26 DIAGNOSIS — Z880 Allergy status to penicillin: Secondary | ICD-10-CM

## 2023-03-26 DIAGNOSIS — K5730 Diverticulosis of large intestine without perforation or abscess without bleeding: Secondary | ICD-10-CM | POA: Diagnosis not present

## 2023-03-26 DIAGNOSIS — Z86010 Personal history of colonic polyps: Secondary | ICD-10-CM

## 2023-03-26 DIAGNOSIS — Z882 Allergy status to sulfonamides status: Secondary | ICD-10-CM

## 2023-03-26 DIAGNOSIS — Z6832 Body mass index (BMI) 32.0-32.9, adult: Secondary | ICD-10-CM | POA: Diagnosis not present

## 2023-03-26 DIAGNOSIS — E669 Obesity, unspecified: Secondary | ICD-10-CM | POA: Diagnosis present

## 2023-03-26 DIAGNOSIS — Z833 Family history of diabetes mellitus: Secondary | ICD-10-CM

## 2023-03-26 DIAGNOSIS — T361X5A Adverse effect of cephalosporins and other beta-lactam antibiotics, initial encounter: Secondary | ICD-10-CM | POA: Diagnosis present

## 2023-03-26 DIAGNOSIS — E039 Hypothyroidism, unspecified: Secondary | ICD-10-CM | POA: Diagnosis not present

## 2023-03-26 DIAGNOSIS — K719 Toxic liver disease, unspecified: Principal | ICD-10-CM | POA: Diagnosis present

## 2023-03-26 DIAGNOSIS — Z8052 Family history of malignant neoplasm of bladder: Secondary | ICD-10-CM

## 2023-03-26 DIAGNOSIS — Z9049 Acquired absence of other specified parts of digestive tract: Secondary | ICD-10-CM

## 2023-03-26 DIAGNOSIS — T360X5A Adverse effect of penicillins, initial encounter: Secondary | ICD-10-CM | POA: Diagnosis present

## 2023-03-26 DIAGNOSIS — Z7982 Long term (current) use of aspirin: Secondary | ICD-10-CM

## 2023-03-26 LAB — CBC WITH DIFFERENTIAL/PLATELET
Abs Immature Granulocytes: 0.02 10*3/uL (ref 0.00–0.07)
Basophils Absolute: 0 10*3/uL (ref 0.0–0.1)
Basophils Relative: 0 %
Eosinophils Absolute: 0.2 10*3/uL (ref 0.0–0.5)
Eosinophils Relative: 3 %
HCT: 36.7 % (ref 36.0–46.0)
Hemoglobin: 12 g/dL (ref 12.0–15.0)
Immature Granulocytes: 0 %
Lymphocytes Relative: 18 %
Lymphs Abs: 0.8 10*3/uL (ref 0.7–4.0)
MCH: 29.5 pg (ref 26.0–34.0)
MCHC: 32.7 g/dL (ref 30.0–36.0)
MCV: 90.2 fL (ref 80.0–100.0)
Monocytes Absolute: 0.5 10*3/uL (ref 0.1–1.0)
Monocytes Relative: 10 %
Neutro Abs: 3 10*3/uL (ref 1.7–7.7)
Neutrophils Relative %: 69 %
Platelets: 237 10*3/uL (ref 150–400)
RBC: 4.07 MIL/uL (ref 3.87–5.11)
RDW: 15.7 % — ABNORMAL HIGH (ref 11.5–15.5)
WBC: 4.5 10*3/uL (ref 4.0–10.5)
nRBC: 0 % (ref 0.0–0.2)

## 2023-03-26 LAB — PROTIME-INR
INR: 0.9 (ref 0.8–1.2)
Prothrombin Time: 12.5 seconds (ref 11.4–15.2)

## 2023-03-26 LAB — COMPREHENSIVE METABOLIC PANEL
ALT: 149 U/L — ABNORMAL HIGH (ref 0–44)
AST: 154 U/L — ABNORMAL HIGH (ref 15–41)
Albumin: 3.6 g/dL (ref 3.5–5.0)
Alkaline Phosphatase: 229 U/L — ABNORMAL HIGH (ref 38–126)
Anion gap: 11 (ref 5–15)
BUN: 31 mg/dL — ABNORMAL HIGH (ref 8–23)
CO2: 22 mmol/L (ref 22–32)
Calcium: 8.8 mg/dL — ABNORMAL LOW (ref 8.9–10.3)
Chloride: 101 mmol/L (ref 98–111)
Creatinine, Ser: 2.52 mg/dL — ABNORMAL HIGH (ref 0.44–1.00)
GFR, Estimated: 20 mL/min — ABNORMAL LOW (ref >60)
Glucose, Bld: 207 mg/dL — ABNORMAL HIGH (ref 70–99)
Potassium: 3.1 mmol/L — ABNORMAL LOW (ref 3.5–5.1)
Sodium: 134 mmol/L — ABNORMAL LOW (ref 135–145)
Total Bilirubin: 8.1 mg/dL — ABNORMAL HIGH (ref 0.3–1.2)
Total Protein: 7.2 g/dL (ref 6.5–8.1)

## 2023-03-26 MED ORDER — ONDANSETRON HCL 4 MG/2ML IJ SOLN
4.0000 mg | Freq: Once | INTRAMUSCULAR | Status: AC
  Administered 2023-03-26: 4 mg via INTRAVENOUS
  Filled 2023-03-26: qty 2

## 2023-03-26 MED ORDER — IOHEXOL 300 MG/ML  SOLN: 100.0000 mL | Freq: Once | INTRAMUSCULAR | Status: DC | PRN

## 2023-03-26 MED ORDER — ENOXAPARIN SODIUM 30 MG/0.3ML IJ SOSY
30.0000 mg | PREFILLED_SYRINGE | INTRAMUSCULAR | Status: DC
  Filled 2023-03-26: qty 0.3

## 2023-03-26 MED ORDER — DIPHENHYDRAMINE HCL 50 MG/ML IJ SOLN
25.0000 mg | Freq: Once | INTRAMUSCULAR | Status: AC
  Administered 2023-03-26: 25 mg via INTRAVENOUS
  Filled 2023-03-26: qty 1

## 2023-03-26 MED ORDER — SODIUM CHLORIDE 0.9 % IV BOLUS
1000.0000 mL | Freq: Once | INTRAVENOUS | Status: AC
  Administered 2023-03-26: 1000 mL via INTRAVENOUS

## 2023-03-26 NOTE — ED Provider Triage Note (Signed)
Emergency Medicine Provider Triage Evaluation Note  SHATIYA BEDEL , a 72 y.o. female  was evaluated in triage.  Pt complains of jaundice. Unknown onset, although family member indicates noticing yellowing of the sclera last summer..  Review of Systems  Positive: Abdominal pain, nausea, vomiting, pruritis Negative: Chest pain, shortness of breath  Physical Exam  BP 137/69 (BP Location: Left Arm)   Pulse 93   Temp 98 F (36.7 C) (Oral)   Resp 17   SpO2 100%  Gen:   Awake, no distress   Resp:  Normal effort  MSK:   Moves extremities without difficulty  Other:  Scleral icterus, yellow skin  Medical Decision Making  Medically screening exam initiated at 7:03 PM.  Appropriate orders placed.  Eileen MAXENE STAUDACHER was informed that the remainder of the evaluation will be completed by another provider, this initial triage assessment does not replace that evaluation, and the importance of remaining in the ED until their evaluation is complete.     Felicie Morn, NP 03/26/23 [id number]

## 2023-03-26 NOTE — ED Provider Notes (Signed)
[place] [facility] Provider Note   CSN: [id number] Arrival date & time: 03/26/23  1736     History  Chief Complaint  Patient presents with   Jaundice    Isabella FittingCorinne H [name] is a 72 y.o. female history of recurrent diverticulitis, IBS, here presenting with jaundice.  Patient states that she just finished a course of Augmentin several days ago for her diverticulitis.  She has she was seen by GI about a week ago and had unremarkable LFTs.  She went to PCP office today with her daughter for her daughter's appointment.  Her doctor noticed that she is very jaundiced.  She states that for the last 2 to 3 days she has been having some epigastric pain.  She also feels very itchy all over and feels very foggy.  The history is provided by the patient.       Home Medications Prior to Admission medications   Medication Sig Start Date End Date Taking? Authorizing Provider  Plecanatide (TRULANCE) 3 MG TABS Take 1 tablet (3 mg total) by mouth daily. 03/18/23   Doree Albeeollier, Amanda R, PA-C  acetaminophen (TYLENOL) 500 MG tablet Take 500 mg by mouth every 6 (six) hours as needed for moderate pain.    [provider]  amoxicillin-clavulanate (AUGMENTIN) 875-125 MG tablet Take 1 tablet by mouth 2 (two) times daily. 03/14/23   Doree Albeeollier, Amanda R, PA-C  aspirin EC 325 MG tablet Take 1 tablet (325 mg total) by mouth daily. 06/04/22   Jeani SowKulik, Ann Marie, MD  Blood Glucose Monitoring Suppl (ACCU-CHEK GUIDE) w/Device KIT Use to test blood sugar 3 times daily. 06/14/22   Jeani SowKulik, Ann Marie, MD  carbidopa-levodopa (SINEMET IR) 25-100 MG tablet Take by mouth. 01/17/22   [provider]  Cholecalciferol 25 MCG (1000 UT) capsule Take 1 capsule (1,000 Units total) by mouth daily. 06/04/22   Jeani SowKulik, Ann Marie, MD  cyanocobalamin (,VITAMIN B-12,) 1000 MCG/ML injection Inject 1,000 mcg into the muscle every 30 (thirty) days.     [provider]  dicyclomine (BENTYL) 20 MG  tablet Take 1 tablet (20 mg total) by mouth every 8 (eight) hours as needed for spasms (AB pain). 03/14/23   Doree Albeeollier, Amanda R, PA-C  erythromycin ophthalmic ointment SMARTSIG:Sparingly In Eye(s) Daily 01/24/23   [provider]  estradiol (ESTRACE) 0.1 MG/GM vaginal cream Place 0.5g at night twice a week 02/14/23   Marguerita BeardsSchroeder, Michelle N, MD  Evolocumab [facility](REPATHA SURECLICK) 140 MG/ML SOAJ INJECT 1 PEN SUBCUTANEOUSLY EVERY 14 DAYS 06/04/22   Jeani SowKulik, Ann Marie, MD  glucose blood (ACCU-CHEK GUIDE) test strip Use to test blood sugar 3 times daily 06/14/22   Jeani SowKulik, Ann Marie, MD  Lancets Misc. (ACCU-CHEK FASTCLIX LANCET) KIT Use to check blood sugar 3 times daily 06/14/22   Jeani SowKulik, Ann Marie, MD  levothyroxine (SYNTHROID) 50 MCG tablet Take 1 tablet (50 mcg total) by mouth daily. 06/04/22   Jeani SowKulik, Ann Marie, MD  magnesium citrate SOLN Take 1 Bottle by mouth once.    [provider]  Multiple Vitamin (MULTIVITAMIN WITH MINERALS) TABS tablet Take 1 tablet by mouth daily. 02/15/22   Raulkar, Drema PryKrutika P, MD  pantoprazole (PROTONIX) 20 MG tablet Take 1 tablet (20 mg total) by mouth 2 (two) times daily. 03/14/23   Doree Albeeollier, Amanda R, PA-C  potassium chloride SA (KLOR-CON M) 20 MEQ tablet Take 1 tablet (20 mEq total) by mouth 2 (two) times daily. 03/08/23   Jeani SowKulik, Ann Marie, MD  rifaximin [employer](XIFAXAN) 550  MG TABS tablet Take by mouth. 04/05/20   [provider]  torsemide (DEMADEX) 20 MG tablet Take 1 tablet (20 mg total) by mouth daily. 12/07/22   Jeani Sow, MD      Allergies    Atenolol, Atenolol, Atorvastatin, Canagliflozin, Losartan potassium, Acarbose, Cinnamon, Empagliflozin, Glimepiride, Metformin and related, Other, Pioglitazone, Pravastatin, Sitagliptin, Spironolactone-hctz, Statins, Sulfamethoxazole, Tramadol hcl, Acarbose, Atorvastatin, Atorvastatin calcium, Canagliflozin, Crestor [rosuvastatin], Empagliflozin, Glimepiride, Losartan potassium, Metformin, Other, Pioglitazone, Pravastatin,  Rosuvastatin calcium, Spironolactone-hctz, Tramadol, Lasix [furosemide], and Sulfa antibiotics    Review of Systems   Review of Systems  Gastrointestinal:  Positive for abdominal pain and nausea.  All other systems reviewed and are negative.   Physical Exam Updated Vital Signs BP 136/71 (BP Location: Right Arm)   Pulse 92   Temp 98.3 F (36.8 C) (Oral)   Resp (!) 25   SpO2 99%  Physical Exam Vitals and nursing note reviewed.  Constitutional:      Appearance: Normal appearance.  HENT:     Head: Normocephalic.     Nose: Nose normal.     Mouth/Throat:     Mouth: Mucous membranes are moist.  Eyes:     Comments: Obvious scleral icterus  Cardiovascular:     Rate and Rhythm: Normal rate and regular rhythm.     Pulses: Normal pulses.     Heart sounds: Normal heart sounds.  Pulmonary:     Effort: Pulmonary effort is normal.     Breath sounds: Normal breath sounds.  Abdominal:     General: Abdomen is flat.     Palpations: Abdomen is soft.  Musculoskeletal:        General: Normal range of motion.     Cervical back: Normal range of motion and neck supple.  Skin:    General: Skin is warm.     Capillary Refill: Capillary refill takes less than 2 seconds.  Neurological:     General: No focal deficit present.     Mental Status: She is alert and oriented to person, place, and time.  Psychiatric:        Mood and Affect: Mood normal.        Behavior: Behavior normal.     ED Results / Procedures / Treatments   Labs (all labs ordered are listed, but only abnormal results are displayed) Labs Reviewed  COMPREHENSIVE METABOLIC PANEL - Abnormal; Notable for the following components:      Result Value   Sodium 134 (*)    Potassium 3.1 (*)    Glucose, Bld 207 (*)    BUN 31 (*)    Creatinine, Ser 2.52 (*)    Calcium 8.8 (*)    AST 154 (*)    ALT 149 (*)    Alkaline Phosphatase 229 (*)    Total Bilirubin 8.1 (*)    GFR, Estimated 20 (*)    All other components within normal  limits  CBC WITH DIFFERENTIAL/PLATELET - Abnormal; Notable for the following components:   RDW 15.7 (*)    All other components within normal limits  PROTIME-INR  URINALYSIS, ROUTINE W REFLEX MICROSCOPIC  HEPATITIS PANEL, ACUTE    EKG None  Radiology CT ABDOMEN PELVIS WO CONTRAST  Result Date: 03/26/2023 CLINICAL DATA:  Jaundice, abdominal pain. EXAM: CT ABDOMEN AND PELVIS WITHOUT CONTRAST TECHNIQUE: Multidetector CT imaging of the abdomen and pelvis was performed following the standard protocol without IV contrast. RADIATION DOSE REDUCTION: This exam was performed according to the departmental dose-optimization program which includes  automated exposure control, adjustment of the mA and/or kV according to patient size and/or use of iterative reconstruction technique. COMPARISON:  CT abdomen and pelvis 03/21/2023 FINDINGS: Lower chest: No acute abnormality. Hepatobiliary: No focal liver abnormality is seen. Status post cholecystectomy. No biliary dilatation. Pancreas: Unremarkable. No pancreatic ductal dilatation or surrounding inflammatory changes. Spleen: Mildly enlarged, unchanged. Adrenals/Urinary Tract: Prominent vascular calcifications noted in the kidneys. No hydronephrosis or perinephric stranding. The adrenal glands, ureters and bladder are within normal limits. Stomach/Bowel: Stomach is within normal limits. There is no evidence of bowel wall thickening, distention, or inflammatory changes. There is diffuse colonic diverticulosis without evidence for acute diverticulitis. The appendix is not visualized. Vascular/Lymphatic: There are atherosclerotic calcifications of the aorta and branch vessels. Aorta is normal in size. No enlarged lymph nodes are seen. Reproductive: Status post hysterectomy. No adnexal masses. Other: No abdominal wall hernia or abnormality. No abdominopelvic ascites. Musculoskeletal: Mild compression deformity of L1 is unchanged. IMPRESSION: 1. No acute localizing process in  the abdomen or pelvis. 2. Colonic diverticulosis without evidence for acute diverticulitis. 3. Stable mild splenomegaly. Aortic Atherosclerosis (ICD10-I70.0). Electronically Signed   By: Darliss Cheney M.D.   On: 03/26/2023 22:23    Procedures Procedures    Medications Ordered in ED Medications  iohexol (OMNIPAQUE) 300 MG/ML solution 100 mL (has no administration in time range)  enoxaparin (LOVENOX) injection 30 mg (has no administration in time range)  sodium chloride 0.9 % bolus 1,000 mL (1,000 mLs Intravenous New Bag/Given 03/26/23 2211)  ondansetron (ZOFRAN) injection 4 mg (4 mg Intravenous Given 03/26/23 2211)  diphenhydrAMINE (BENADRYL) injection 25 mg (25 mg Intravenous Given 03/26/23 2211)    ED Course/ Medical Decision Making/ A&P                             Medical Decision Making SHAROL THORNLEY is a 72 y.o. female here presenting with new onset jaundice.  Patient has chronic abdominal pain from IBS and also from diverticulitis.  Consider hepatobiliary mass versus pancreatitis.  Plan to get CBC and CMP and CT abdomen pelvis.  10:30 pm Reviewed patient's labs and independently interpreted imaging studies.  Patient's AST and ALT were elevated and bilirubin is now 8.  It was normal several days ago.  CT abdomen pelvis did not show any mass.  I was able to discuss with Dr. Orvan Falconer from GI.  She wants me to order MRCP with and without contrast.  GI will follow tomorrow and she agreed with medicine admission for AKI and jaundice.   Problems Addressed: AKI (acute kidney injury): acute illness or injury Jaundice: acute illness or injury  Amount and/or Complexity of Data Reviewed Labs: ordered. Decision-making details documented in ED Course. Radiology: ordered and independent interpretation performed. Decision-making details documented in ED Course.  Risk Prescription drug management. Decision regarding hospitalization.    Final Clinical Impression(s) / ED Diagnoses Final  diagnoses:  None    Rx / DC Orders ED Discharge Orders     None         Charlynne Pander, MD 03/26/23 2326

## 2023-03-26 NOTE — H&P (Incomplete)
History and Physical  Isabella FittingCorinne H Borneman NGE:[id number] DOB: [birth year] DOA: 03/26/2023  Referring physician: Dr. Silverio LayYao, EDP  PCP: Jeani SowKulik, Ann Marie, MD  Outpatient Specialists: [facility] Patient coming from: Home  Chief Complaint: Jaundice, abnormal lab results.  HPI: [name] is a 72 y.o. female with medical history significant for recurrent diverticulitis recently on Augmentin, completed course of the antibiotic several days ago, chronic constipation, type 2 diabetes, hyperlipidemia, hypothyroidism, GERD, CKD 3A, essential tremor, history of endometrial cancer who [facility] at the recommendation of her primary care provider due to new onset jaundice.  The patient was seen by GI about a week ago and had unremarkable LFTs.    Went to PCP office today for her daughter's appointment and was noted by PCP to be jaundiced.  Was advised to come to the ED for further evaluation.  Endorses diffuse abdominal pain for the past 2 to 3 days.  Associated with all over body pruritus and several days constipation.  No reported emesis.  In the ED, lab studies are remarkable for elevated liver chemistries and elevated T bilirubin 8.1.  EDP discussed the case with [facility], Dr. Orvan FalconerBeavers, who recommended MRCP with and without contrast and admission by the medicine team.  GI will follow-up tomorrow.  Additionally, lab studies remarkable for elevated creatinine above baseline with concern for prerenal azotemia.  ED Course: Tmax 98.3.  BP 125/91, pulse 95, respiratory 17, saturation 100% on room air.  Lab studies remarkable for serum sodium 134, potassium 3.1, serum glucose 207, BUN 31, creatinine 2.52, GFR of 20 from baseline creatinine 1.0 and GFR 50.99.  Review of Systems: Review of systems as noted in the HPI. All other systems reviewed and are negative.   Past Medical History:  Diagnosis Date  . Allergy   . Anemia   . Arthritis   . Asthma    Dianosed years ago-no meds at this time  .  Cancer [facility](HCC) 2021   endometrial   . Cataract    Right eye removed-still has left cataract   . Chronic kidney disease 1997   Nepthrotic syndrom with minimal change-in remission per pt - last seen by kidney MD- 2 mos ago ? name of MD   . Dementia (HCC)   . Diabetes mellitus    type 2   . Diverticulosis   . Dyslipidemia   . Dyspnea    with exertion   . Dysrhythmia    "skips beats"  . Endometrial cancer (HCC) 2021  . Family history of adverse reaction to anesthesia    son stopped breathing during surgery , mother slow to wake up   . Fibromyalgia 1980's  . Generalized anxiety disorder   . GERD (gastroesophageal reflux disease)    occ- will use Tums and Prolosec  prn   . Heart murmur   . Hyperlipemia   . Hypertension    not on blood pressure meds due to allergies - last dose of meds- months ago   . Hypothyroidism   . Hypothyroidism (acquired)   . Internal hemorrhoids   . Irritable bowel syndrome 1980's  . Liver cancer (HCC)   . Major depressive disorder   . Migraine headaches 1980's   On meds-well controlled  . 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  . Morbid obesity (HCC)   . Pernicious anemia   . Pneumonia 2009   Several times over the past several years  . PONV (postoperative nausea and vomiting)    was told by neurologist due  to calcificaton in brain not to be put to sleep, N/V during Op and Recovery   . Recurrent upper respiratory infection (URI)   . Sleep apnea    no cpap  . Subarachnoid hemorrhage (HCC) 2010  . Vaginal polyp    benign per pt   Past Surgical History:  Procedure Laterality Date  . CATARACT EXTRACTION Right   . CHOLECYSTECTOMY  1983  . COLONOSCOPY  2008   DB  . DILATION AND CURETTAGE OF UTERUS  1972  . HYSTEROSCOPY WITH D & C  06/29/2011   Procedure: DILATATION AND CURETTAGE (D&C) /HYSTEROSCOPY;  Surgeon: Reva Bores, MD;  Location: [facility];  Service: Gynecology;  Laterality: N/A;  . ROBOTIC ASSISTED TOTAL HYSTERECTOMY WITH BILATERAL SALPINGO  OOPHERECTOMY N/A 10/27/2020   Procedure: XI ROBOTIC ASSISTED TOTAL HYSTERECTOMY WITH BILATERAL SALPINGO OOPHORECTOMY;  Surgeon: Adolphus Birchwood, MD;  Location: [facility];  Service: Gynecology;  Laterality: N/A;  . SENTINEL NODE BIOPSY N/A 10/27/2020   Procedure: SENTINEL NODE BIOPSY;  Surgeon: Adolphus Birchwood, MD;  Location: [facility];  Service: Gynecology;  Laterality: N/A;    Social History:  reports that she has never smoked. She has never used smokeless tobacco. She reports that she does not currently use alcohol. She reports that she does not use drugs.   Allergies  Allergen Reactions  . Atenolol     Stomach problems, chills  . Atenolol Other (See Comments) and Shortness Of Breath  . Atorvastatin     Other reaction(s): retain fld  . Canagliflozin     ALL DIABETIC MEDS PER PT   abdominal pain, headache  . Losartan Potassium     chills, HA, stomach pain  . Acarbose Nausea Only     headache,nausea  . Cinnamon     Stomach pain  . Empagliflozin      headaches  . Glimepiride     ALL DIABETIC MEDS PER PT  abdominal pain, headache  . Metformin And Related Nausea And Vomiting and Other (See Comments)    Patient states that she has severe chills, headache and cramping additionally. Says blood sugar is uncontrolled because of this  . Other Other (See Comments)    ALL DIABETIC MEDS PER PT   . Pioglitazone Other (See Comments)    ALL DIABETIC MEDS PER PT  Other reaction(s): peripheral edema  . Pravastatin     Other reaction(s): retain fld  . Sitagliptin     ALL DIABETIC MEDS PER PT  Other reaction(s): headache  . Spironolactone-Hctz Other (See Comments)  . Statins     Myalgia  . Sulfamethoxazole Other (See Comments)    ALL DIABETIC MEDS PER PT   . Tramadol Hcl Itching  . Acarbose Other (See Comments)    Headache  . Atorvastatin Other (See Comments)  . Atorvastatin Calcium     Other reaction(s): retain fld  . Canagliflozin Other (See Comments)  . Crestor [Rosuvastatin] Other (See  Comments)  . Empagliflozin Other (See Comments)  . Glimepiride Other (See Comments)  . Losartan Potassium Other (See Comments)  . Metformin Other (See Comments)  . Other Other (See Comments)  . Pioglitazone Other (See Comments)  . Pravastatin Other (See Comments)  . Rosuvastatin Calcium   . Spironolactone-Hctz Other (See Comments)  . Tramadol   . Lasix [Furosemide] Rash    Sugars increased  . Sulfa Antibiotics Rash and Other (See Comments)    Mother has told patient in the past not to take, she cannot recall there reaction  Family History  Problem Relation Age of Onset  . Anesthesia problems Mother        hard to wake post op   . Cerebrovascular Disease Mother        Never had stroke, but had CEA.  . Liver cancer Father        mets to liver   . Bladder Cancer Father   . Colon cancer Father        mets to colon   . Schizophrenia Daughter        Schizoaffective disorder  . Anesthesia problems Son        stopped breathing post op   . Tremor Son   . Anesthesia problems Granddaughter        PONV  . High blood pressure Other   . Diabetes Other   . Arthritis Other   . Schizophrenia Child   . Tremor Child   . Dementia Neg Hx   . Esophageal cancer Neg Hx   . Rectal cancer Neg Hx   . Stomach cancer Neg Hx   . Colon polyps Neg Hx       Prior to Admission medications   Medication Sig Start Date End Date Taking? Authorizing Provider  Plecanatide (TRULANCE) 3 MG TABS Take 1 tablet (3 mg total) by mouth daily. 03/18/23   Doree Albee, PA-C  acetaminophen (TYLENOL) 500 MG tablet Take 500 mg by mouth every 6 (six) hours as needed for moderate pain.    [provider]  amoxicillin-clavulanate (AUGMENTIN) 875-125 MG tablet Take 1 tablet by mouth 2 (two) times daily. 03/14/23   Doree Albee, PA-C  aspirin EC 325 MG tablet Take 1 tablet (325 mg total) by mouth daily. 06/04/22   Jeani Sow, MD  Blood Glucose Monitoring Suppl (ACCU-CHEK GUIDE) w/Device KIT Use to  test blood sugar 3 times daily. 06/14/22   Jeani Sow, MD  carbidopa-levodopa (SINEMET IR) 25-100 MG tablet Take by mouth. 01/17/22   [provider]  Cholecalciferol 25 MCG (1000 UT) capsule Take 1 capsule (1,000 Units total) by mouth daily. 06/04/22   Jeani Sow, MD  cyanocobalamin (,VITAMIN B-12,) 1000 MCG/ML injection Inject 1,000 mcg into the muscle every 30 (thirty) days.     [provider]  dicyclomine (BENTYL) 20 MG tablet Take 1 tablet (20 mg total) by mouth every 8 (eight) hours as needed for spasms (AB pain). 03/14/23   Doree Albee, PA-C  erythromycin ophthalmic ointment SMARTSIG:Sparingly In Eye(s) Daily 01/24/23   [provider]  estradiol (ESTRACE) 0.1 MG/GM vaginal cream Place 0.5g at night twice a week 02/14/23   Marguerita Beards, MD  Evolocumab [facility] SURECLICK) 140 MG/ML SOAJ INJECT 1 PEN SUBCUTANEOUSLY EVERY 14 DAYS 06/04/22   Jeani Sow, MD  glucose blood (ACCU-CHEK GUIDE) test strip Use to test blood sugar 3 times daily 06/14/22   Jeani Sow, MD  Lancets Misc. (ACCU-CHEK FASTCLIX LANCET) KIT Use to check blood sugar 3 times daily 06/14/22   Jeani Sow, MD  levothyroxine (SYNTHROID) 50 MCG tablet Take 1 tablet (50 mcg total) by mouth daily. 06/04/22   Jeani Sow, MD  magnesium citrate SOLN Take 1 Bottle by mouth once.    [provider]  Multiple Vitamin (MULTIVITAMIN WITH MINERALS) TABS tablet Take 1 tablet by mouth daily. 02/15/22   Raulkar, Drema Pry, MD  pantoprazole (PROTONIX) 20 MG tablet Take 1 tablet (20 mg total) by mouth 2 (two) times daily. 03/14/23  Quentin Mulling R, PA-C  potassium chloride SA (KLOR-CON M) 20 MEQ tablet Take 1 tablet (20 mEq total) by mouth 2 (two) times daily. 03/08/23   Jeani Sow, MD  rifaximin [employer] [employer]) 550 MG TABS tablet Take by mouth. 04/05/20   [provider]  torsemide (DEMADEX) 20 MG tablet Take 1 tablet (20 mg total) by mouth daily. 12/07/22   Jeani Sow, MD    Physical Exam: BP 136/71 (BP Location: Right Arm)   Pulse 92   Temp 98.3 F (36.8 C) (Oral)   Resp (!) 25   SpO2 99%   General: 72 y.o. year-old female well developed well nourished in no acute distress.  Alert and interactive. Cardiovascular: Regular rate and rhythm with no rubs or gallops.  No thyromegaly or JVD noted.  No lower extremity edema. 2/4 pulses in all 4 extremities. Respiratory: Clear to auscultation with no wheezes or rales. Good inspiratory effort. Abdomen: Soft diffusely tender mildly distended with normal bowel sounds x4 quadrants. Muskuloskeletal: No cyanosis, clubbing or edema noted bilaterally Neuro: CN II-XII intact, strength, sensation, reflexes Skin: No ulcerative lesions noted or rashes Psychiatry: Mood is appropriate for condition and setting          Labs on Admission:  Basic Metabolic Panel: Recent Labs  Lab 03/26/23 1923  NA 134*  K 3.1*  CL 101  CO2 22  GLUCOSE 207*  BUN 31*  CREATININE 2.52*  CALCIUM 8.8*   Liver Function Tests: Recent Labs  Lab 03/26/23 1923  AST 154*  ALT 149*  ALKPHOS 229*  BILITOT 8.1*  PROT 7.2  ALBUMIN 3.6   No results for input(s): "LIPASE", "AMYLASE" in the last 168 hours. No results for input(s): "AMMONIA" in the last 168 hours. CBC: Recent Labs  Lab 03/26/23 1923  WBC 4.5  NEUTROABS 3.0  HGB 12.0  HCT 36.7  MCV 90.2  PLT 237   Cardiac Enzymes: No results for input(s): "CKTOTAL", "CKMB", "CKMBINDEX", "TROPONINI" in the last 168 hours.  BNP (last 3 results) No results for input(s): "BNP" in the last 8760 hours.  ProBNP (last 3 results) Recent Labs    12/19/22 1421  PROBNP 92.0    CBG: No results for input(s): "GLUCAP" in the last 168 hours.  Radiological Exams on Admission: CT ABDOMEN PELVIS WO CONTRAST  Result Date: 03/26/2023 CLINICAL DATA:  Jaundice, abdominal pain. EXAM: CT ABDOMEN AND PELVIS WITHOUT CONTRAST TECHNIQUE: Multidetector CT imaging of the abdomen and  pelvis was performed following the standard protocol without IV contrast. RADIATION DOSE REDUCTION: This exam was performed according to the departmental dose-optimization program which includes automated exposure control, adjustment of the mA and/or kV according to patient size and/or use of iterative reconstruction technique. COMPARISON:  CT abdomen and pelvis 03/21/2023 FINDINGS: Lower chest: No acute abnormality. Hepatobiliary: No focal liver abnormality is seen. Status post cholecystectomy. No biliary dilatation. Pancreas: Unremarkable. No pancreatic ductal dilatation or surrounding inflammatory changes. Spleen: Mildly enlarged, unchanged. Adrenals/Urinary Tract: Prominent vascular calcifications noted in the kidneys. No hydronephrosis or perinephric stranding. The adrenal glands, ureters and bladder are within normal limits. Stomach/Bowel: Stomach is within normal limits. There is no evidence of bowel wall thickening, distention, or inflammatory changes. There is diffuse colonic diverticulosis without evidence for acute diverticulitis. The appendix is not visualized. Vascular/Lymphatic: There are atherosclerotic calcifications of the aorta and branch vessels. Aorta is normal in size. No enlarged lymph nodes are seen. Reproductive: Status post hysterectomy. No adnexal masses. Other: No abdominal wall hernia or abnormality.  No abdominopelvic ascites. Musculoskeletal: Mild compression deformity of L1 is unchanged. IMPRESSION: 1. No acute localizing process in the abdomen or pelvis. 2. Colonic diverticulosis without evidence for acute diverticulitis. 3. Stable mild splenomegaly. Aortic Atherosclerosis (ICD10-I70.0). Electronically Signed   By: Darliss Cheney M.D.   On: 03/26/2023 22:23    EKG: I independently viewed the EKG done and my findings are as followed: None available at the time of the visit.  Assessment/Plan Present on Admission: . Jaundice  Principal Problem:   Jaundice  New onset jaundice in  the setting of elevated liver chemistries and hyperbilirubinemia 8.1, unclear etiology CT abdomen and pelvis without contrast revealed: 1. No acute localizing process in the abdomen or pelvis. 2. Colonic diverticulosis without evidence for acute diverticulitis. 3. Stable mild splenomegaly. 4. Aortic Atherosclerosis (ICD10-I70.0). [facility] consulted MRCP with and without contrast ordered.  Elevated liver chemistries Acute transaminitis Hyperbilirubinemia Presented with elevated alkaline phosphatase, elevated AST, ALT with T. bili of 8.1. Unclear etiology CT abdomen pelvis without contrast was unrevealing MRCP with and without contrast is pending GI consulted to assist with the management Avoid hepatotoxic agents.  AKI on CKD 3A, suspect prerenal in the setting of dehydration, possibly overdiuresis  Baseline creatinine appears to be 1.08 with GFR 50. Presented with creatinine of 2.5 to with GFR of 20 Hold off home torsemide Encourage oral intake. Closely monitor urine output Repeat chemistry panel in the morning.  Type 2 diabetes with hyperglycemia Last hemoglobin A1c was 7.2 on 03/05/2023 Start insulin sliding scale     DVT prophylaxis: ***   Code Status: ***   Family Communication: ***   Disposition Plan: ***   Consults called: ***   Admission status: ***    Status is: Inpatient {Inpatient:23812}   Darlin Drop MD [facility] Pager [phone]  If 7PM-7AM, please contact night-coverage [email or web address] Password [facility]  03/26/2023, 10:55 PM

## 2023-03-26 NOTE — ED Triage Notes (Addendum)
C/o jaundice to sclera and skin with easy bruising.  Pt referred by PCP.  A&O x4 and ambulatory to triage Hx of cholecystomy.  Denies ETOH and NSAIDS usage.

## 2023-03-26 NOTE — H&P (Incomplete)
History and Physical  [name] DOB: [birth year] DOA: 03/26/2023  Referring physician: Dr. Silverio Lay, EDP  PCP: Jeani Sow, MD  Outpatient Specialists: [facility] Patient coming from: Home  Chief Complaint: Jaundice, abnormal lab results.  HPI: [name] is a 72 y.o. female with medical history significant for recurrent diverticulitis recently on Augmentin, completed course of the antibiotic several days ago, chronic constipation, type 2 diabetes, hyperlipidemia, hypothyroidism, GERD, CKD 3A, essential tremor, history of endometrial cancer who presented to [facility] ED at the recommendation of her primary care provider due to new onset jaundice.  The patient was seen by GI about a week ago and had unremarkable LFTs.    Went to PCP office today for her daughter's appointment and was noted by PCP to be jaundiced.  Was advised to come to the ED for further evaluation.  Endorses diffuse abdominal pain for the past 2 to 3 days.  Associated with all over body pruritus and several days constipation.  No reported emesis.  In the ED, lab studies are remarkable for elevated liver chemistries and elevated T bilirubin 8.1.  EDP discussed the case with [facility], Dr. Orvan Falconer, who recommended MRCP with and without contrast and admission by the medicine team.  GI will follow-up tomorrow.  Additionally, lab studies remarkable for elevated creatinine above baseline with concern for prerenal azotemia.  ED Course: Tmax 98.3.  BP 125/91, pulse 95, respiratory 17, saturation 100% on room air.  Lab studies remarkable for serum sodium 134, potassium 3.1, serum glucose 207, BUN 31, creatinine 2.52, GFR of 20 from baseline creatinine 1.0 and GFR 50.99.  Review of Systems: Review of systems as noted in the HPI. All other systems reviewed and are negative.   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 name of MD    Dementia (HCC)    Diabetes mellitus    type 2    Diverticulosis    Dyslipidemia    Dyspnea    with exertion    Dysrhythmia    "skips beats"   Endometrial cancer (HCC) 2021   Family history of adverse reaction to anesthesia    son stopped breathing during surgery , mother slow to wake up    Fibromyalgia 1980's   Generalized anxiety disorder    GERD (gastroesophageal reflux disease)    occ- will use Tums and Prolosec  prn    Heart murmur    Hyperlipemia    Hypertension    not on blood pressure meds due to allergies - last dose of meds- months ago    Hypothyroidism    Hypothyroidism (acquired)    Internal hemorrhoids    Irritable bowel syndrome 1980's   Liver cancer (HCC)    Major depressive disorder    Migraine headaches 1980's   On meds-well controlled   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   Morbid obesity (HCC)    Pernicious anemia    Pneumonia 2009   Several times over the past several years   PONV (postoperative nausea and vomiting)    was told by neurologist due  to calcificaton in brain not to be put to sleep, N/V during Op and Recovery    Recurrent upper respiratory infection (URI)    Sleep apnea    no cpap   Subarachnoid hemorrhage (HCC) 2010   Vaginal polyp    benign per pt   Past Surgical History:  Procedure Laterality Date   CATARACT EXTRACTION Right    CHOLECYSTECTOMY  1983   COLONOSCOPY  2008   DB   DILATION AND CURETTAGE OF UTERUS  1972   HYSTEROSCOPY WITH D & C  06/29/2011   Procedure: DILATATION AND CURETTAGE (D&C) /HYSTEROSCOPY;  Surgeon: Reva Bores, MD;  Location: [facility];  Service: Gynecology;  Laterality: N/A;   ROBOTIC ASSISTED TOTAL HYSTERECTOMY WITH BILATERAL SALPINGO OOPHERECTOMY N/A 10/27/2020    Procedure: XI ROBOTIC ASSISTED TOTAL HYSTERECTOMY WITH BILATERAL SALPINGO OOPHORECTOMY;  Surgeon: Adolphus Birchwood, MD;  Location: [facility];  Service: Gynecology;  Laterality: N/A;   SENTINEL NODE BIOPSY N/A 10/27/2020   Procedure: SENTINEL NODE BIOPSY;  Surgeon: Adolphus Birchwood, MD;  Location: [facility];  Service: Gynecology;  Laterality: N/A;    Social History:  reports that she has never smoked. She has never used smokeless tobacco. She reports that she does not currently use alcohol. She reports that she does not use drugs.   Allergies  Allergen Reactions   Atenolol     Stomach problems, chills   Atenolol Other (See Comments) and Shortness Of Breath   Atorvastatin     Other reaction(s): retain fld   Canagliflozin     ALL DIABETIC MEDS PER PT   abdominal pain, headache   Losartan Potassium     chills, HA, stomach pain   Acarbose Nausea Only     headache,nausea   Cinnamon     Stomach pain   Empagliflozin      headaches   Glimepiride     ALL DIABETIC MEDS PER PT  abdominal pain, headache   Metformin And Related Nausea And Vomiting and Other (See Comments)    Patient states that she has severe chills, headache and cramping additionally. Says blood sugar is uncontrolled because of this   Other Other (See Comments)    ALL DIABETIC MEDS PER PT    Pioglitazone Other (See Comments)    ALL DIABETIC MEDS PER PT  Other reaction(s): peripheral edema   Pravastatin     Other reaction(s): retain fld   Sitagliptin     ALL DIABETIC MEDS PER PT  Other reaction(s): headache   Spironolactone-Hctz Other (See Comments)   Statins     Myalgia   Sulfamethoxazole Other (See Comments)    ALL DIABETIC MEDS PER PT    Tramadol Hcl Itching   Acarbose Other (See Comments)    Headache   Atorvastatin Other (See Comments)   Atorvastatin Calcium     Other reaction(s): retain fld   Canagliflozin Other (See Comments)   Crestor [Rosuvastatin] Other (See Comments)   Empagliflozin Other (See Comments)    Glimepiride Other (See Comments)   Losartan Potassium Other (See Comments)   Metformin Other (See Comments)   Other Other (See Comments)   Pioglitazone Other (See Comments)   Pravastatin Other (See Comments)   Rosuvastatin Calcium    Spironolactone-Hctz Other (See Comments)   Tramadol    Lasix [Furosemide] Rash    Sugars increased   Sulfa Antibiotics Rash and Other (See Comments)    Mother has told patient in the past not to take, she cannot recall there reaction  Family History  Problem Relation Age of Onset   Anesthesia problems Mother        hard to wake post op    Cerebrovascular Disease Mother        Never had stroke, but had CEA.   Liver cancer Father        mets to liver    Bladder Cancer Father    Colon cancer Father        mets to colon    Schizophrenia Daughter        Schizoaffective disorder   Anesthesia problems Son        stopped breathing post op    Tremor Son    Anesthesia problems Granddaughter        PONV   High blood pressure Other    Diabetes Other    Arthritis Other    Schizophrenia Child    Tremor Child    Dementia Neg Hx    Esophageal cancer Neg Hx    Rectal cancer Neg Hx    Stomach cancer Neg Hx    Colon polyps Neg Hx       Prior to Admission medications   Medication Sig Start Date End Date Taking? Authorizing Provider  Plecanatide (TRULANCE) 3 MG TABS Take 1 tablet (3 mg total) by mouth daily. 03/18/23   Doree Albee, PA-C  acetaminophen (TYLENOL) 500 MG tablet Take 500 mg by mouth every 6 (six) hours as needed for moderate pain.    [provider]  amoxicillin-clavulanate (AUGMENTIN) 875-125 MG tablet Take 1 tablet by mouth 2 (two) times daily. 03/14/23   Doree Albee, PA-C  aspirin EC 325 MG tablet Take 1 tablet (325 mg total) by mouth daily. 06/04/22   Jeani Sow, MD  Blood Glucose Monitoring Suppl (ACCU-CHEK GUIDE) w/Device KIT Use to test blood sugar 3 times daily. 06/14/22   Jeani Sow, MD   carbidopa-levodopa (SINEMET IR) 25-100 MG tablet Take by mouth. 01/17/22   [provider]  Cholecalciferol 25 MCG (1000 UT) capsule Take 1 capsule (1,000 Units total) by mouth daily. 06/04/22   Jeani Sow, MD  cyanocobalamin (,VITAMIN B-12,) 1000 MCG/ML injection Inject 1,000 mcg into the muscle every 30 (thirty) days.     [provider]  dicyclomine (BENTYL) 20 MG tablet Take 1 tablet (20 mg total) by mouth every 8 (eight) hours as needed for spasms (AB pain). 03/14/23   Doree Albee, PA-C  erythromycin ophthalmic ointment SMARTSIG:Sparingly In Eye(s) Daily 01/24/23   [provider]  estradiol (ESTRACE) 0.1 MG/GM vaginal cream Place 0.5g at night twice a week 02/14/23   Marguerita Beards, MD  Evolocumab [facility] SURECLICK) 140 MG/ML SOAJ INJECT 1 PEN SUBCUTANEOUSLY EVERY 14 DAYS 06/04/22   Jeani Sow, MD  glucose blood (ACCU-CHEK GUIDE) test strip Use to test blood sugar 3 times daily 06/14/22   Jeani Sow, MD  Lancets Misc. (ACCU-CHEK FASTCLIX LANCET) KIT Use to check blood sugar 3 times daily 06/14/22   Jeani Sow, MD  levothyroxine (SYNTHROID) 50 MCG tablet Take 1 tablet (50 mcg total) by mouth daily. 06/04/22   Jeani Sow, MD  magnesium citrate SOLN Take 1 Bottle by mouth once.    [provider]  Multiple Vitamin (MULTIVITAMIN WITH MINERALS) TABS tablet Take 1 tablet by mouth daily. 02/15/22   Raulkar, Drema Pry, MD  pantoprazole (PROTONIX) 20 MG tablet Take 1 tablet (20 mg total) by mouth 2 (two) times daily. 03/14/23  Quentin Mullingollier, Amanda R, PA-C  potassium chloride SA (KLOR-CON M) 20 MEQ tablet Take 1 tablet (20 mEq total) by mouth 2 (two) times daily. 03/08/23   Jeani SowKulik, Ann Marie, MD  rifaximin [employer](XIFAXAN) 550 MG TABS tablet Take by mouth. 04/05/20   [provider]  torsemide (DEMADEX) 20 MG tablet Take 1 tablet (20 mg total) by mouth daily. 12/07/22   Jeani SowKulik, Ann Marie, MD    Physical Exam: BP 136/71 (BP Location: Right  Arm)   Pulse 92   Temp 98.3 F (36.8 C) (Oral)   Resp (!) 25   SpO2 99%   General: 72 y.o. year-old female well developed well nourished in no acute distress.  Alert and interactive. Cardiovascular: Regular rate and rhythm with no rubs or gallops.  No thyromegaly or JVD noted.  No lower extremity edema. 2/4 pulses in all 4 extremities. Respiratory: Clear to auscultation with no wheezes or rales. Good inspiratory effort. Abdomen: Soft diffusely tender mildly distended with normal bowel sounds x4 quadrants. Muskuloskeletal: No cyanosis, clubbing or edema noted bilaterally Neuro: CN II-XII intact, strength, sensation, reflexes Skin: No ulcerative lesions noted or rashes Psychiatry: Mood is appropriate for condition and setting          Labs on Admission:  Basic Metabolic Panel: Recent Labs  Lab 03/26/23 1923  NA 134*  K 3.1*  CL 101  CO2 22  GLUCOSE 207*  BUN 31*  CREATININE 2.52*  CALCIUM 8.8*   Liver Function Tests: Recent Labs  Lab 03/26/23 1923  AST 154*  ALT 149*  ALKPHOS 229*  BILITOT 8.1*  PROT 7.2  ALBUMIN 3.6   No results for input(s): "LIPASE", "AMYLASE" in the last 168 hours. No results for input(s): "AMMONIA" in the last 168 hours. CBC: Recent Labs  Lab 03/26/23 1923  WBC 4.5  NEUTROABS 3.0  HGB 12.0  HCT 36.7  MCV 90.2  PLT 237   Cardiac Enzymes: No results for input(s): "CKTOTAL", "CKMB", "CKMBINDEX", "TROPONINI" in the last 168 hours.  BNP (last 3 results) No results for input(s): "BNP" in the last 8760 hours.  ProBNP (last 3 results) Recent Labs    12/19/22 1421  PROBNP 92.0    CBG: No results for input(s): "GLUCAP" in the last 168 hours.  Radiological Exams on Admission: CT ABDOMEN PELVIS WO CONTRAST  Result Date: 03/26/2023 CLINICAL DATA:  Jaundice, abdominal pain. EXAM: CT ABDOMEN AND PELVIS WITHOUT CONTRAST TECHNIQUE: Multidetector CT imaging of the abdomen and pelvis was performed following the standard protocol without IV  contrast. RADIATION DOSE REDUCTION: This exam was performed according to the departmental dose-optimization program which includes automated exposure control, adjustment of the mA and/or kV according to patient size and/or use of iterative reconstruction technique. COMPARISON:  CT abdomen and pelvis 03/21/2023 FINDINGS: Lower chest: No acute abnormality. Hepatobiliary: No focal liver abnormality is seen. Status post cholecystectomy. No biliary dilatation. Pancreas: Unremarkable. No pancreatic ductal dilatation or surrounding inflammatory changes. Spleen: Mildly enlarged, unchanged. Adrenals/Urinary Tract: Prominent vascular calcifications noted in the kidneys. No hydronephrosis or perinephric stranding. The adrenal glands, ureters and bladder are within normal limits. Stomach/Bowel: Stomach is within normal limits. There is no evidence of bowel wall thickening, distention, or inflammatory changes. There is diffuse colonic diverticulosis without evidence for acute diverticulitis. The appendix is not visualized. Vascular/Lymphatic: There are atherosclerotic calcifications of the aorta and branch vessels. Aorta is normal in size. No enlarged lymph nodes are seen. Reproductive: Status post hysterectomy. No adnexal masses. Other: No abdominal wall hernia or abnormality.  No abdominopelvic ascites. Musculoskeletal: Mild compression deformity of L1 is unchanged. IMPRESSION: 1. No acute localizing process in the abdomen or pelvis. 2. Colonic diverticulosis without evidence for acute diverticulitis. 3. Stable mild splenomegaly. Aortic Atherosclerosis (ICD10-I70.0). Electronically Signed   By: Darliss Cheney M.D.   On: 03/26/2023 22:23    EKG: I independently viewed the EKG done and my findings are as followed: None available at the time of the visit.  Assessment/Plan Present on Admission:  Jaundice  Principal Problem:   Jaundice  New onset jaundice in the setting of elevated liver chemistries and hyperbilirubinemia  8.1, unclear etiology CT abdomen and pelvis without contrast revealed: 1. No acute localizing process in the abdomen or pelvis. 2. Colonic diverticulosis without evidence for acute diverticulitis. 3. Stable mild splenomegaly. 4. Aortic Atherosclerosis (ICD10-I70.0). [facility] consulted MRCP with and without contrast ordered.  Elevated liver chemistries Acute transaminitis Hyperbilirubinemia Presented with elevated alkaline phosphatase, elevated AST, ALT with T. bili of 8.1. INR normal, 0.9. Unclear etiology CT abdomen pelvis without contrast was unrevealing MRCP with and without contrast is pending GI consulted to assist with the management Avoid hepatotoxic agents.  AKI on CKD 3A, suspect prerenal in the setting of dehydration, possibly overdiuresis  Baseline creatinine appears to be 1.08 with GFR 50. Presented with creatinine of 2.5 to with GFR of 20 She received 1 L IV fluid bolus NS x 1 in the ED Hold off home torsemide Encourage oral intake. Closely monitor urine output Repeat chemistry panel in the morning.  Type 2 diabetes with hyperglycemia Last hemoglobin A1c was 7.2 on 03/05/2023 Start insulin sliding scale   Hypovolemic hyponatremia Serum sodium 124 Hold off home diuretics Alcohol oral protein calorie intake  Hypokalemia Serum potassium 3.1 Repleted orally with KCl 40 mEq x 1 dose.  Chronic constipation Bowel regimen in place.  HFpEF 60 to 65% Last 2D echo done on 03/12/2023 showed LVEF 65%.  No Euvolemic on exam. Hold off home diuretics due to AKI Continue strict I's and O's and daily weight  Hyperlipidemia Hold off home regimen due to acute transaminitis  Hypothyroidism Resume home levothyroxine  GERD Resume home PPI      DVT prophylaxis: Subcu Lovenox daily  Code Status: Full code, as stated by the patient herself and her son was present at bedside.  Family Communication: Updated her son at bedside.  Disposition Plan: Admitted to  telemetry medical unit  Consults called: [facility], Dr. Orvan Falconer.  Admission status: Inpatient status.   Status is: Inpatient The patient requires at least 2 midnights for further evaluation and treatment of present condition.   Darlin Drop MD [facility] Pager [phone]  If 7PM-7AM, please contact night-coverage [email or web address] Password [facility]  03/26/2023, 10:55 PM

## 2023-03-26 NOTE — ED Notes (Signed)
Patient transported to CT 

## 2023-03-27 ENCOUNTER — Inpatient Hospital Stay (HOSPITAL_COMMUNITY): Payer: 59

## 2023-03-27 DIAGNOSIS — R7989 Other specified abnormal findings of blood chemistry: Secondary | ICD-10-CM

## 2023-03-27 DIAGNOSIS — E119 Type 2 diabetes mellitus without complications: Secondary | ICD-10-CM

## 2023-03-27 DIAGNOSIS — K529 Noninfective gastroenteritis and colitis, unspecified: Secondary | ICD-10-CM

## 2023-03-27 DIAGNOSIS — N183 Chronic kidney disease, stage 3 unspecified: Secondary | ICD-10-CM

## 2023-03-27 DIAGNOSIS — R17 Unspecified jaundice: Secondary | ICD-10-CM | POA: Diagnosis not present

## 2023-03-27 LAB — GLUCOSE, CAPILLARY
Glucose-Capillary: 153 mg/dL — ABNORMAL HIGH (ref 70–99)
Glucose-Capillary: 165 mg/dL — ABNORMAL HIGH (ref 70–99)
Glucose-Capillary: 166 mg/dL — ABNORMAL HIGH (ref 70–99)
Glucose-Capillary: 211 mg/dL — ABNORMAL HIGH (ref 70–99)
Glucose-Capillary: 272 mg/dL — ABNORMAL HIGH (ref 70–99)

## 2023-03-27 LAB — MAGNESIUM: Magnesium: 1.5 mg/dL — ABNORMAL LOW (ref 1.7–2.4)

## 2023-03-27 LAB — COMPREHENSIVE METABOLIC PANEL
ALT: 120 U/L — ABNORMAL HIGH (ref 0–44)
ALT: 135 U/L — ABNORMAL HIGH (ref 0–44)
AST: 116 U/L — ABNORMAL HIGH (ref 15–41)
AST: 120 U/L — ABNORMAL HIGH (ref 15–41)
Albumin: 2.9 g/dL — ABNORMAL LOW (ref 3.5–5.0)
Albumin: 3.5 g/dL (ref 3.5–5.0)
Alkaline Phosphatase: 223 U/L — ABNORMAL HIGH (ref 38–126)
Alkaline Phosphatase: 264 U/L — ABNORMAL HIGH (ref 38–126)
Anion gap: 8 (ref 5–15)
Anion gap: 11 (ref 5–15)
BUN: 34 mg/dL — ABNORMAL HIGH (ref 8–23)
BUN: 34 mg/dL — ABNORMAL HIGH (ref 8–23)
CO2: 19 mmol/L — ABNORMAL LOW (ref 22–32)
CO2: 21 mmol/L — ABNORMAL LOW (ref 22–32)
Calcium: 8.4 mg/dL — ABNORMAL LOW (ref 8.9–10.3)
Calcium: 9 mg/dL (ref 8.9–10.3)
Chloride: 109 mmol/L (ref 98–111)
Chloride: 105 mmol/L (ref 98–111)
Creatinine, Ser: 2.45 mg/dL — ABNORMAL HIGH (ref 0.44–1.00)
Creatinine, Ser: 2.52 mg/dL — ABNORMAL HIGH (ref 0.44–1.00)
GFR, Estimated: 20 mL/min — ABNORMAL LOW (ref >60)
GFR, Estimated: 20 mL/min — ABNORMAL LOW (ref >60)
Glucose, Bld: 159 mg/dL — ABNORMAL HIGH (ref 70–99)
Glucose, Bld: 235 mg/dL — ABNORMAL HIGH (ref 70–99)
Potassium: 3.8 mmol/L (ref 3.5–5.1)
Potassium: 4.1 mmol/L (ref 3.5–5.1)
Sodium: 136 mmol/L (ref 135–145)
Sodium: 137 mmol/L (ref 135–145)
Total Bilirubin: 7.6 mg/dL — ABNORMAL HIGH (ref 0.3–1.2)
Total Bilirubin: 9.3 mg/dL — ABNORMAL HIGH (ref 0.3–1.2)
Total Protein: 5.9 g/dL — ABNORMAL LOW (ref 6.5–8.1)
Total Protein: 6.8 g/dL (ref 6.5–8.1)

## 2023-03-27 LAB — CBC
HCT: 31.5 % — ABNORMAL LOW (ref 36.0–46.0)
Hemoglobin: 10.5 g/dL — ABNORMAL LOW (ref 12.0–15.0)
MCH: 29.9 pg (ref 26.0–34.0)
MCHC: 33.3 g/dL (ref 30.0–36.0)
MCV: 89.7 fL (ref 80.0–100.0)
Platelets: 182 10*3/uL (ref 150–400)
RBC: 3.51 MIL/uL — ABNORMAL LOW (ref 3.87–5.11)
RDW: 16 % — ABNORMAL HIGH (ref 11.5–15.5)
WBC: 3.2 10*3/uL — ABNORMAL LOW (ref 4.0–10.5)
nRBC: 0 % (ref 0.0–0.2)

## 2023-03-27 LAB — BILIRUBIN, FRACTIONATED(TOT/DIR/INDIR)
Bilirubin, Direct: 6.4 mg/dL — ABNORMAL HIGH (ref 0.0–0.2)
Indirect Bilirubin: 3.5 mg/dL — ABNORMAL HIGH (ref 0.3–0.9)
Total Bilirubin: 9.9 mg/dL — ABNORMAL HIGH (ref 0.3–1.2)

## 2023-03-27 LAB — PHOSPHORUS: Phosphorus: 5 mg/dL — ABNORMAL HIGH (ref 2.5–4.6)

## 2023-03-27 MED ORDER — POTASSIUM CHLORIDE CRYS ER 20 MEQ PO TBCR
40.0000 meq | EXTENDED_RELEASE_TABLET | Freq: Once | ORAL | Status: AC
  Administered 2023-03-27: 40 meq via ORAL
  Filled 2023-03-27: qty 2

## 2023-03-27 MED ORDER — PROCHLORPERAZINE EDISYLATE 10 MG/2ML IJ SOLN
5.0000 mg | Freq: Four times a day (QID) | INTRAMUSCULAR | Status: DC | PRN
  Administered 2023-03-28: 5 mg via INTRAVENOUS
  Filled 2023-03-27: qty 2

## 2023-03-27 MED ORDER — INSULIN ASPART 100 UNIT/ML IJ SOLN: 0.0000 [IU] | Freq: Every day | INTRAMUSCULAR | Status: DC

## 2023-03-27 MED ORDER — LEVOTHYROXINE SODIUM 50 MCG PO TABS
50.0000 ug | ORAL_TABLET | Freq: Every day | ORAL | Status: DC
  Administered 2023-03-27 – 2023-03-28 (×2): 50 ug via ORAL
  Filled 2023-03-27 (×2): qty 1

## 2023-03-27 MED ORDER — INSULIN ASPART 100 UNIT/ML IJ SOLN
0.0000 [IU] | Freq: Three times a day (TID) | INTRAMUSCULAR | Status: DC
  Administered 2023-03-28: 3 [IU] via SUBCUTANEOUS
  Administered 2023-03-28: 2 [IU] via SUBCUTANEOUS

## 2023-03-27 MED ORDER — MELATONIN 3 MG PO TABS
3.0000 mg | ORAL_TABLET | Freq: Every evening | ORAL | Status: DC | PRN
  Administered 2023-03-27: 3 mg via ORAL
  Filled 2023-03-27: qty 1

## 2023-03-27 MED ORDER — PANTOPRAZOLE SODIUM 20 MG PO TBEC
20.0000 mg | DELAYED_RELEASE_TABLET | Freq: Two times a day (BID) | ORAL | Status: DC
  Administered 2023-03-27 – 2023-03-28 (×4): 20 mg via ORAL
  Filled 2023-03-27 (×4): qty 1

## 2023-03-27 MED ORDER — POLYETHYLENE GLYCOL 3350 17 G PO PACK: 17.0000 g | PACK | Freq: Every day | ORAL | Status: DC | PRN

## 2023-03-27 MED ORDER — SENNOSIDES-DOCUSATE SODIUM 8.6-50 MG PO TABS
2.0000 | ORAL_TABLET | Freq: Every day | ORAL | Status: AC
  Administered 2023-03-27 (×2): 2 via ORAL
  Filled 2023-03-27 (×2): qty 2

## 2023-03-27 MED ORDER — SIMETHICONE 40 MG/0.6ML PO SUSP
80.0000 mg | Freq: Once | ORAL | Status: AC
  Administered 2023-03-27: 80 mg via ORAL
  Filled 2023-03-27: qty 1.2

## 2023-03-27 MED ORDER — OXYCODONE HCL 5 MG PO TABS
2.5000 mg | ORAL_TABLET | Freq: Once | ORAL | Status: AC
  Administered 2023-03-27: 2.5 mg via ORAL
  Filled 2023-03-27: qty 1

## 2023-03-27 MED ORDER — GADOBUTROL 1 MMOL/ML IV SOLN
7.0000 mL | Freq: Once | INTRAVENOUS | Status: AC | PRN
  Administered 2023-03-27: 7 mL via INTRAVENOUS

## 2023-03-27 NOTE — Progress Notes (Signed)
PROGRESS NOTE    [name]  JTT:[id number] DOB: November 09, 1951 DOA: 03/26/2023 PCP: Jeani Sow, MD    Brief Narrative:   [name] is a 72 y.o. female with past medical history significant for recurrent diverticulitis recently on Augmentin, DM2, HLD, hypothyroidism, CKD stage IIIa, essential tremor, chronic constipation, history of endometrial cancer, multiple allergies who presented to [facility] ED on 4/9 by the recommendation of her primary care physician due to new onset jaundice.  Patient was seen by GI roughly 1 week ago with unremarkable LFTs and went to the PCP office for her daughter's appointment was noted by daughters PCP to be jaundiced.  Patient endorses abdominal pain over the last 2-3 days associated with diffuse pruritus.  Denies any nausea/vomiting.  In the ED, temperature 98.0 F, HR 93, RR 17, BP 137/69, SpO2 100% on room air.  WBC 4.5, hemoglobin 12.0, platelets 237.  Sodium 134, potassium 3.1, chloride 101, CO2 22, glucose 207, BUN 31, creatinine 2.52.  Alkaline phosphatase 229, AST 154, ALT 149, total bilirubin 8.1.  INR 0.9.  CT abdomen/pelvis with no acute localizing process in the abdomen/pelvis, colonic diverticulosis without evidence for acute diverticulitis, stable mild splenomegaly.  GI was consulted recommended MRCP.  TRH consulted for admission for further evaluation and management of elevated LFTs, total bilirubin with jaundice.  Assessment & Plan:   Elevated LFTs/bilirubin likely secondary to drug-induced liver injury Patient presenting with jaundice, pruritus and was noted to have elevated LFTs on admission with AST 154, ALT 149, total bilirubin 8.1.  CT abdomen/pelvis unrevealing.  RCP with common bile duct dilated up to 12 mm now substantially changed dating back to 12/04/2021 and tapering at the level of the ampulla, no definite filling defect or focal mass lesion, mild splenomegaly, colonic diverticulosis without acute diverticulitis.  Etiology likely  secondary to drug-induced liver injury with recent Augmentin use -- GI following, appreciate assistance -- AST 154>116>120 -- ALT 149>120>135 -- Tbili 8.1>7.6>9.3 -- Acute hepatitis panel: Pending -- EBV, varicella-zoster, CMV: Pending -- Avoid hepatotoxins -- LFTs daily  Acute renal failure on CKD stage III A Etiology likely secondary to prerenal in the setting of dehydration possible overdiuresis on home torsemide.  Creatinine 1.08 with a GFR of 50.  Presenting with a creatinine of 2.52. -- Continue to hold home torsemide -- Monitor urine output -- Repeat renal function in the a.m.  Type 2 diabetes mellitus with hyperglycemia Hemoglobin A1c 7.2 03/05/2023. -- SSI for coverage -- CBG before every meal/at bedtime  Hypovolemic hyponatremia Sodium 134 on admission, likely secondary to poor oral intake in the days preceding hospitalization. -- Na 134>136 -- BMP in am  Hypokalemia Repleted, repeat potassium 3.8 this morning. --BMP in a.m.  Chronic constipation -- MiraLAX daily as needed  Chronic diastolic congestive heart failure, compensated Recent TTE 03/12/2023 with LVEF 65%. --Holding home diuretics due to AKI -- Strict I's and O's and daily weights  Hypothyroidism -- Levothyroxine 50 mcg p.o. daily  GERD -- Protonix 20 mg p.o. twice daily  Obesity Body mass index is 32.79 kg/m.  Discussed with patient needs for aggressive lifestyle changes/weight loss as this complicates all facets of care.  Outpatient follow-up with PCP.     DVT prophylaxis: Place and maintain sequential compression device Start: 03/27/23 0959    Code Status: Full Code Family Communication: No family present at bedside this morning  Disposition Plan:  Level of care: Med-Surg Status is: Inpatient Remains inpatient appropriate because: Pending further GI input, close monitoring  LFTs    Consultants:  [facility]  Procedures:  None  Antimicrobials:   None   Subjective: Seen examined at bedside, resting calmly.  Lying in bed.  Reports pruritus improved.  Seen by GI this morning who believe elevated LFTs secondary to Augmentin use causing drug-induced liver injury.  Patient declining insulin use.  Also wants telemetry removed.  No other questions or concerns at this time.  Denies headache, no dizziness, no chest pain, no palpitations, no shortness of breath, no abdominal pain, no focal weakness, no fatigue, no cough/congestion, no fever/chills/night sweats, no nausea/vomiting/diarrhea, no paresthesias.  No acute events overnight per nursing staff.  Objective: Vitals:   03/27/23 0407 03/27/23 0702 03/27/23 0801 03/27/23 1157  BP: 137/70  130/67 114/61  Pulse: 94  85 82  Resp: 15  18 16   Temp: 97.9 F (36.6 C)  97.9 F (36.6 C) 97.7 F (36.5 C)  TempSrc: Oral  Oral Oral  SpO2: 99%  100% 100%  Weight:  81.3 kg      Intake/Output Summary (Last 24 hours) at 03/27/2023 1453 Last data filed at 03/27/2023 0848 Gross per 24 hour  Intake 118 ml  Output --  Net 118 ml   Filed Weights   03/27/23 0702  Weight: 81.3 kg    Examination:  Physical Exam: GEN: NAD, alert and oriented x 3, wd/wn HEENT: NCAT, PERRL, EOMI, scleral icterus, MMM PULM: CTAB w/o wheezes/crackles, normal respiratory effort, on room air CV: RRR w/o M/G/R GI: abd soft, NTND, NABS, no R/G/M MSK: no peripheral edema, muscle strength globally intact 5/5 bilateral upper/lower extremities NEURO: CN II-XII intact, no focal deficits, sensation to light touch intact PSYCH: normal mood/affect Integumentary: + jaundice, no other concerning rashes/lesions/wounds noted on exposed skin surfaces    Data Reviewed: I have personally reviewed following labs and imaging studies  CBC: Recent Labs  Lab 03/26/23 1923 03/27/23 0536  WBC 4.5 3.2*  NEUTROABS 3.0  --   HGB 12.0 10.5*  HCT 36.7 31.5*  MCV 90.2 89.7  PLT 237 182   Basic Metabolic Panel: Recent Labs  Lab  03/26/23 1923 03/27/23 0536 03/27/23 1046  NA 134* 136 137  K 3.1* 3.8 4.1  CL 101 109 105  CO2 22 19* 21*  GLUCOSE 207* 159* 235*  BUN 31* 34* 34*  CREATININE 2.52* 2.45* 2.52*  CALCIUM 8.8* 8.4* 9.0  MG  --  1.5*  --   PHOS  --  5.0*  --    GFR: Estimated Creatinine Clearance: 19.6 mL/min (A) (by C-G formula based on SCr of 2.52 mg/dL (H)). Liver Function Tests: Recent Labs  Lab 03/26/23 1923 03/27/23 0536 03/27/23 1046  AST 154* 116* 120*  ALT 149* 120* 135*  ALKPHOS 229* 223* 264*  BILITOT 8.1* 7.6* 9.3*  PROT 7.2 5.9* 6.8  ALBUMIN 3.6 2.9* 3.5   No results for input(s): "LIPASE", "AMYLASE" in the last 168 hours. No results for input(s): "AMMONIA" in the last 168 hours. Coagulation Profile: Recent Labs  Lab 03/26/23 1923  INR 0.9   Cardiac Enzymes: No results for input(s): "CKTOTAL", "CKMB", "CKMBINDEX", "TROPONINI" in the last 168 hours. BNP (last 3 results) Recent Labs    12/19/22 1421  PROBNP 92.0   HbA1C: No results for input(s): "HGBA1C" in the last 72 hours. CBG: Recent Labs  Lab 03/27/23 0053 03/27/23 0751 03/27/23 1140  GLUCAP 153* 165* 211*   Lipid Profile: No results for input(s): "CHOL", "HDL", "LDLCALC", "TRIG", "CHOLHDL", "LDLDIRECT" in the last 72 hours.  Thyroid Function Tests: No results for input(s): "TSH", "T4TOTAL", "FREET4", "T3FREE", "THYROIDAB" in the last 72 hours. Anemia Panel: No results for input(s): "VITAMINB12", "FOLATE", "FERRITIN", "TIBC", "IRON", "RETICCTPCT" in the last 72 hours. Sepsis Labs: No results for input(s): "PROCALCITON", "LATICACIDVEN" in the last 168 hours.  No results found for this or any previous visit (from the past 240 hour(s)).       Radiology Studies: MR ABDOMEN MRCP W WO CONTAST  Result Date: 03/27/2023 CLINICAL DATA:  Cholelithiasis and elevated LFTs EXAM: MRI ABDOMEN WITHOUT AND WITH CONTRAST (INCLUDING MRCP) TECHNIQUE: Multiplanar multisequence MR imaging of the abdomen was performed  both before and after the administration of intravenous contrast. Heavily T2-weighted images of the biliary and pancreatic ducts were obtained, and three-dimensional MRCP images were rendered by post processing. CONTRAST:  7mL GADAVIST GADOBUTROL 1 MMOL/ML IV SOLN COMPARISON:  CT abdomen and pelvis dated 03/26/2023, 12/04/2021 FINDINGS: Lower chest: No acute findings. Hepatobiliary: No mass or other parenchymal abnormality identified. No intrahepatic bile duct dilation. Common bile duct is dilated up to 12 mm, not substantially changed dating back to 12/04/2021 and tapering to the level of the ampulla. No definite filling defect or focal mass lesion. Cholecystectomy. Pancreas: No mass, inflammatory changes, or other parenchymal abnormality identified. Spleen: Mildly enlarged in the AP dimension measuring 13.9 cm, decreased from 15.2 cm on 03/26/2023 Adrenals/Urinary Tract: No adrenal nodules. No suspicious renal masses identified. No evidence of hydronephrosis. Stomach/Bowel: Colonic diverticulosis without acute diverticulitis. Vascular/Lymphatic: No pathologically enlarged lymph nodes identified. No abdominal aortic aneurysm demonstrated. Other:  None. Musculoskeletal: No suspicious bone lesions identified. Small fat-containing paraumbilical hernia. Susceptibility artifact of the anterior abdominal wall, likely postsurgical. IMPRESSION: 1. Common bile duct is dilated up to 12 mm, not substantially changed dating back to 12/04/2021 and tapering to the level of the ampulla. No definite filling defect or focal mass lesion. 2. Mild splenomegaly, decreased from 15.2 cm on 03/26/2023. 3. Colonic diverticulosis without acute diverticulitis. Electronically Signed   By: Agustin CreeLimin  Xu M.D.   On: 03/27/2023 08:14   MR 3D Recon At Scanner  Result Date: 03/27/2023 CLINICAL DATA:  Cholelithiasis and elevated LFTs EXAM: MRI ABDOMEN WITHOUT AND WITH CONTRAST (INCLUDING MRCP) TECHNIQUE: Multiplanar multisequence MR imaging of the  abdomen was performed both before and after the administration of intravenous contrast. Heavily T2-weighted images of the biliary and pancreatic ducts were obtained, and three-dimensional MRCP images were rendered by post processing. CONTRAST:  7mL GADAVIST GADOBUTROL 1 MMOL/ML IV SOLN COMPARISON:  CT abdomen and pelvis dated 03/26/2023, 12/04/2021 FINDINGS: Lower chest: No acute findings. Hepatobiliary: No mass or other parenchymal abnormality identified. No intrahepatic bile duct dilation. Common bile duct is dilated up to 12 mm, not substantially changed dating back to 12/04/2021 and tapering to the level of the ampulla. No definite filling defect or focal mass lesion. Cholecystectomy. Pancreas: No mass, inflammatory changes, or other parenchymal abnormality identified. Spleen: Mildly enlarged in the AP dimension measuring 13.9 cm, decreased from 15.2 cm on 03/26/2023 Adrenals/Urinary Tract: No adrenal nodules. No suspicious renal masses identified. No evidence of hydronephrosis. Stomach/Bowel: Colonic diverticulosis without acute diverticulitis. Vascular/Lymphatic: No pathologically enlarged lymph nodes identified. No abdominal aortic aneurysm demonstrated. Other:  None. Musculoskeletal: No suspicious bone lesions identified. Small fat-containing paraumbilical hernia. Susceptibility artifact of the anterior abdominal wall, likely postsurgical. IMPRESSION: 1. Common bile duct is dilated up to 12 mm, not substantially changed dating back to 12/04/2021 and tapering to the level of the ampulla. No definite filling defect or focal mass lesion.  2. Mild splenomegaly, decreased from 15.2 cm on 03/26/2023. 3. Colonic diverticulosis without acute diverticulitis. Electronically Signed   By: Agustin Cree M.D.   On: 03/27/2023 08:14   CT ABDOMEN PELVIS WO CONTRAST  Result Date: 03/26/2023 CLINICAL DATA:  Jaundice, abdominal pain. EXAM: CT ABDOMEN AND PELVIS WITHOUT CONTRAST TECHNIQUE: Multidetector CT imaging of the abdomen  and pelvis was performed following the standard protocol without IV contrast. RADIATION DOSE REDUCTION: This exam was performed according to the departmental dose-optimization program which includes automated exposure control, adjustment of the mA and/or kV according to patient size and/or use of iterative reconstruction technique. COMPARISON:  CT abdomen and pelvis 03/21/2023 FINDINGS: Lower chest: No acute abnormality. Hepatobiliary: No focal liver abnormality is seen. Status post cholecystectomy. No biliary dilatation. Pancreas: Unremarkable. No pancreatic ductal dilatation or surrounding inflammatory changes. Spleen: Mildly enlarged, unchanged. Adrenals/Urinary Tract: Prominent vascular calcifications noted in the kidneys. No hydronephrosis or perinephric stranding. The adrenal glands, ureters and bladder are within normal limits. Stomach/Bowel: Stomach is within normal limits. There is no evidence of bowel wall thickening, distention, or inflammatory changes. There is diffuse colonic diverticulosis without evidence for acute diverticulitis. The appendix is not visualized. Vascular/Lymphatic: There are atherosclerotic calcifications of the aorta and branch vessels. Aorta is normal in size. No enlarged lymph nodes are seen. Reproductive: Status post hysterectomy. No adnexal masses. Other: No abdominal wall hernia or abnormality. No abdominopelvic ascites. Musculoskeletal: Mild compression deformity of L1 is unchanged. IMPRESSION: 1. No acute localizing process in the abdomen or pelvis. 2. Colonic diverticulosis without evidence for acute diverticulitis. 3. Stable mild splenomegaly. Aortic Atherosclerosis (ICD10-I70.0). Electronically Signed   By: Darliss Cheney M.D.   On: 03/26/2023 22:23        Scheduled Meds:  insulin aspart  0-5 Units Subcutaneous QHS   insulin aspart  0-9 Units Subcutaneous TID WC   levothyroxine  50 mcg Oral Q0600   pantoprazole  20 mg Oral BID   senna-docusate  2 tablet Oral QHS    Continuous Infusions:   LOS: 1 day    Time spent: 52 minutes spent on chart review, discussion with nursing staff, consultants, updating family and interview/physical exam; more than 50% of that time was spent in counseling and/or coordination of care.    Alvira Philips [place], DO [facility] Available via Epic secure chat 7am-7pm After these hours, please refer to coverage provider listed on [email or web address] 03/27/2023, 2:53 PM

## 2023-03-27 NOTE — Progress Notes (Signed)
Pt. Refused her Lovenox injection and insulin. Educated on the importance of those medication however she is hesitant to take new meds. Pt. Stated "I developed allergies to 30 prescription medications that's why I'm scared to start a new one" will continue to reinforce. MD was notified.Marland Kitchen

## 2023-03-27 NOTE — Progress Notes (Signed)
  Transition of Care [facility]) Screening Note   Patient Details  Name: [name] Date of Birth: 02-19-1951   Transition of Care [facility]) CM/SW Contact:    Otelia Santee, LCSW Phone Number: 03/27/2023, 10:05 AM    Transition of Care Department [facility]) has reviewed patient and no TOC needs have been identified at this time. We will continue to monitor patient advancement through interdisciplinary progression rounds. If new patient transition needs arise, please place a TOC consult.

## 2023-03-27 NOTE — Consult Note (Addendum)
Consultation  Referring Provider: [facility] Primary Care Physician:  Jeani Sow, MD Primary Gastroenterologist:  Dr. Lavon Paganini  Reason for Consultation: Elevated LFTs/jaundice  HPI: [name] is a 72 y.o. female, established with Dr. Everlene Other who has history of diverticular disease, prior diverticulitis, IBS, and chronic constipation.  In addition with history of GERD, prior adenomatous colon polyps, adult onset diabetes mellitus, chronic kidney disease stage III, prior history of endometrial cancer for which she underwent surgery hypertension, hyperlipidemia, fibromyalgia, dementia, hypothyroidism, and is status post subarachnoid bleed in 2015. She was last seen in our office on 03/14/2023 by Quentin Mulling, PA-C after she had been started on Augmentin by her PCP Dr. Ruthine Dose for acute diverticulitis.  She did have CT on 03/05/2023 that was consistent with uncomplicated diverticulitis involving the proximal transverse colon. At the time she was seen in the office she was continuing to have complaints of abdominal pain, and chills.  Augmentin was extended for an additional 5 days, and labs were ordered.  Patient was having difficulty with constipation but has been intolerant to multiple medications in the past and does not feel that MiraLAX helps her at all . Labs on 03/14/2023 showed normal LFTs, and normal CBC She had been seen by her PCP apparently early yesterday and was noted to be jaundiced and sent to the emergency room.  Patient was unaware that she was jaundiced and had noticed that her urine was very dark and had been itching over the past 4 to 5 days.  She is a difficult historian because she says she has been feeling bad for the past couple of months, seem frustrated about her ability to be seen in our office.  On careful questioning her abdominal pain has improved but she does not think it is completely resolved and wonders if part of it is due to constipation.  She named  several meds that she cannot take for constipation. She has had some headache over the past 4 to 5 days has not felt well in general, has had some nausea and said she had 1 episode of vomiting at home.  Labs in the ER yesterday showed sodium 130/potassium 3.1/BUN 34/creatinine 2.45 T. bili 8.1/alk phos 229/AST 154/ALT 149 CBC unremarkable/INR 0.9 acute hepatitis panel pending  Reviewing prior labs LFTs again normal on 03/14/2023 and also normal on 03/05/2023.  She had CK and ANA both done at that time also both within normal limits sed rate mildly elevated at 33.  Repeat CT of the abdomen pelvis yesterday shows diffuse colonic diverticulosis but no evidence of diverticulitis, normal-appearing liver, status postcholecystectomy with no ductal dilation  MRI/MRCP was obtained per the ER physician which again showed a normal-appearing liver, CBD of 12 mm with no change since 2022, status postcholecystectomy, unremarkable pancreas, there is some mild splenic enlargement no adenopathy  The only new medication patient has been on recently was the 2-week course of Augmentin.  Today-BUN 34/creatinine 2.52 T. bili 9.3/alk phos 264/AST 120/ALT 135   Past Medical History:  Diagnosis Date   Allergy    Anemia    Arthritis    Asthma    Dianosed years ago-no meds at this time   [facility] 2021   endometrial    Cataract    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 name of MD  Dementia (HCC)    Diabetes mellitus    type 2    Diverticulosis    Dyslipidemia    Dyspnea    with exertion    Dysrhythmia    "skips beats"   Endometrial cancer (HCC) 2021   Family history of adverse reaction to anesthesia    son stopped breathing during surgery , mother slow to wake up    Fibromyalgia 1980's   Generalized anxiety disorder    GERD (gastroesophageal reflux disease)     occ- will use Tums and Prolosec  prn    Heart murmur    Hyperlipemia    Hypertension    not on blood pressure meds due to allergies - last dose of meds- months ago    Hypothyroidism    Hypothyroidism (acquired)    Internal hemorrhoids    Irritable bowel syndrome 1980's   Liver cancer (HCC)    Major depressive disorder    Migraine headaches 1980's   On meds-well controlled   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   Morbid obesity (HCC)    Pernicious anemia    Pneumonia 2009   Several times over the past several years   PONV (postoperative nausea and vomiting)    was told by neurologist due to calcificaton in brain not to be put to sleep, N/V during Op and Recovery    Recurrent upper respiratory infection (URI)    Sleep apnea    no cpap   Subarachnoid hemorrhage (HCC) 2010   Vaginal polyp    benign per pt    Past Surgical History:  Procedure Laterality Date   CATARACT EXTRACTION Right    CHOLECYSTECTOMY  1983   COLONOSCOPY  2008   DB   DILATION AND CURETTAGE OF UTERUS  1972   HYSTEROSCOPY WITH D & C  06/29/2011   Procedure: DILATATION AND CURETTAGE (D&C) /HYSTEROSCOPY;  Surgeon: Reva Boresanya S Pratt, MD;  Location: [facility];  Service: Gynecology;  Laterality: N/A;   ROBOTIC ASSISTED TOTAL HYSTERECTOMY WITH BILATERAL SALPINGO OOPHERECTOMY N/A 10/27/2020   Procedure: XI ROBOTIC ASSISTED TOTAL HYSTERECTOMY WITH BILATERAL SALPINGO OOPHORECTOMY;  Surgeon: Adolphus Birchwoodossi, Emma, MD;  Location: [facility];  Service: Gynecology;  Laterality: N/A;   SENTINEL NODE BIOPSY N/A 10/27/2020   Procedure: SENTINEL NODE BIOPSY;  Surgeon: Adolphus Birchwoodossi, Emma, MD;  Location: [facility];  Service: Gynecology;  Laterality: N/A;    Prior to Admission medications   Medication Sig Start Date End Date Taking? Authorizing Provider  Plecanatide (TRULANCE) 3 MG TABS Take 1 tablet (3 mg total) by mouth daily. 03/18/23   Doree Albeeollier, Amanda R, PA-C  acetaminophen (TYLENOL) 500 MG tablet Take 500 mg by mouth every 6 (six) hours as needed for  moderate pain.    [provider]  amoxicillin-clavulanate (AUGMENTIN) 875-125 MG tablet Take 1 tablet by mouth 2 (two) times daily. 03/14/23   Doree Albeeollier, Amanda R, PA-C  aspirin EC 325 MG tablet Take 1 tablet (325 mg total) by mouth daily. 06/04/22   Jeani SowKulik, Ann Marie, MD  carbidopa-levodopa (SINEMET IR) 25-100 MG tablet Take by mouth. 01/17/22   [provider]  Cholecalciferol 25 MCG (1000 UT) capsule Take 1 capsule (1,000 Units total) by mouth daily. 06/04/22   Jeani SowKulik, Ann Marie, MD  cyanocobalamin (,VITAMIN B-12,) 1000 MCG/ML injection Inject 1,000 mcg into the muscle every 30 (thirty) days.     [provider]  dicyclomine (BENTYL) 20 MG tablet Take 1 tablet (20 mg total) by mouth every 8 (eight) hours as needed for spasms (AB pain).  03/14/23   Doree Albee, PA-C  erythromycin ophthalmic ointment SMARTSIG:Sparingly In Eye(s) Daily 01/24/23   [provider]  estradiol (ESTRACE) 0.1 MG/GM vaginal cream Place 0.5g at night twice a week 02/14/23   Marguerita Beards, MD  Evolocumab [facility] SURECLICK) 140 MG/ML SOAJ INJECT 1 PEN SUBCUTANEOUSLY EVERY 14 DAYS 06/04/22   Jeani Sow, MD  glucose blood (ACCU-CHEK GUIDE) test strip Use to test blood sugar 3 times daily 06/14/22   Jeani Sow, MD  levothyroxine (SYNTHROID) 50 MCG tablet Take 1 tablet (50 mcg total) by mouth daily. 06/04/22   Jeani Sow, MD  magnesium citrate SOLN Take 1 Bottle by mouth once.    [provider]  Multiple Vitamin (MULTIVITAMIN WITH MINERALS) TABS tablet Take 1 tablet by mouth daily. 02/15/22   Raulkar, Drema Pry, MD  pantoprazole (PROTONIX) 20 MG tablet Take 1 tablet (20 mg total) by mouth 2 (two) times daily. 03/14/23   Doree Albee, PA-C  potassium chloride SA (KLOR-CON M) 20 MEQ tablet Take 1 tablet (20 mEq total) by mouth 2 (two) times daily. 03/08/23   Jeani Sow, MD  rifaximin [employer]) 550 MG TABS tablet Take by mouth. 04/05/20   [provider]   torsemide (DEMADEX) 20 MG tablet Take 1 tablet (20 mg total) by mouth daily. 12/07/22   Jeani Sow, MD    Current Facility-Administered Medications  Medication Dose Route Frequency Provider Last Rate Last Admin   insulin aspart (novoLOG) injection 0-5 Units  0-5 Units Subcutaneous QHS Hall, Carole N, DO       insulin aspart (novoLOG) injection 0-9 Units  0-9 Units Subcutaneous TID WC Dow Adolph N, DO   3 Units at 03/27/23 1219   iohexol (OMNIPAQUE) 300 MG/ML solution 100 mL  100 mL Intravenous Once PRN Charlynne Pander, MD       levothyroxine (SYNTHROID) tablet 50 mcg  50 mcg Oral Q0600 Darlin Drop, DO   50 mcg at 03/27/23 0604   melatonin tablet 3 mg  3 mg Oral QHS PRN Darlin Drop, DO       pantoprazole (PROTONIX) EC tablet 20 mg  20 mg Oral BID Dow Adolph N, DO   20 mg at 03/27/23 0908   polyethylene glycol (MIRALAX / GLYCOLAX) packet 17 g  17 g Oral Daily PRN Dow Adolph N, DO       prochlorperazine (COMPAZINE) injection 5 mg  5 mg Intravenous Q6H PRN Dow Adolph N, DO       senna-docusate (Senokot-S) tablet 2 tablet  2 tablet Oral QHS Dow Adolph N, DO   2 tablet at 03/27/23 0113    Allergies as of 03/26/2023 - Review Complete 03/26/2023  Allergen Reaction Noted   Atenolol  09/29/2019   Atenolol Other (See Comments) and Shortness Of Breath 09/23/2019   Atorvastatin  05/20/2018   Canagliflozin  05/20/2018   Losartan potassium  05/20/2018   Acarbose Nausea Only 05/20/2018   Cinnamon  06/15/2015   Empagliflozin  11/28/2021   Glimepiride  05/20/2018   Metformin and related Nausea And Vomiting and Other (See Comments) 06/26/2011   Other Other (See Comments) 04/15/2020   Pioglitazone Other (See Comments) 04/15/2020   Pravastatin  05/20/2018   Sitagliptin  05/20/2018   Spironolactone-hctz Other (See Comments) 11/28/2021   Statins  10/14/2020   Sulfamethoxazole Other (See Comments) 03/24/2007   Tramadol hcl Itching 10/29/2020   Acarbose Other (See Comments)  05/20/2018   Atorvastatin Other (See Comments)  11/28/2021   Atorvastatin calcium  11/28/2021   Canagliflozin Other (See Comments) 11/28/2021   Crestor [rosuvastatin] Other (See Comments) 11/28/2021   Empagliflozin Other (See Comments) 11/28/2021   Glimepiride Other (See Comments) 11/28/2021   Losartan potassium Other (See Comments) 11/28/2021   Metformin Other (See Comments) 11/28/2021   Other Other (See Comments) 11/28/2021   Pioglitazone Other (See Comments) 11/28/2021   Pravastatin Other (See Comments) 11/28/2021   Rosuvastatin calcium  03/14/2022   Spironolactone-hctz Other (See Comments) 11/28/2021   Tramadol  03/14/2022   Lasix [furosemide] Rash 07/24/2022   Sulfa antibiotics Rash and Other (See Comments) 06/16/2011    Family History  Problem Relation Age of Onset   Anesthesia problems Mother        hard to wake post op    Cerebrovascular Disease Mother        Never had stroke, but had CEA.   Liver cancer Father        mets to liver    Bladder Cancer Father    Colon cancer Father        mets to colon    Schizophrenia Daughter        Schizoaffective disorder   Anesthesia problems Son        stopped breathing post op    Tremor Son    Anesthesia problems Granddaughter        PONV   High blood pressure Other    Diabetes Other    Arthritis Other    Schizophrenia Child    Tremor Child    Dementia Neg Hx    Esophageal cancer Neg Hx    Rectal cancer Neg Hx    Stomach cancer Neg Hx    Colon polyps Neg Hx     Social History   Socioeconomic History   Marital status: Legally Separated    Spouse name: Not on file   Number of children: 3   Years of education: 12   Highest education level: Not on file  Occupational History   Occupation: Retired     Comment: nanny   Occupation: retired  Tobacco Use   Smoking status: Never   Smokeless tobacco: Never  Building services engineer Use: Never used  Substance and Sexual Activity   Alcohol use: Not Currently    Comment:  quit 1978   Drug use: Never   Sexual activity: Not Currently  Other Topics Concern   Not on file  Social History Narrative    Lives at home. Her granddaughter lives with her and daughter    Right handed   Social Determinants of Health   Financial Resource Strain: Not on file  Food Insecurity: Not on file  Transportation Needs: Not on file  Physical Activity: Not on file  Stress: Not on file  Social Connections: Not on file  Intimate Partner Violence: Not on file    Review of Systems: Pertinent positive and negative review of systems were noted in the above HPI section.  All other review of systems was otherwise negative.   Physical Exam: Vital signs in last 24 hours: Temp:  [97.7 F (36.5 C)-98.3 F (36.8 C)] 97.7 F (36.5 C) (04/10 1157) Pulse Rate:  [82-99] 82 (04/10 1157) Resp:  [15-25] 16 (04/10 1157) BP: (114-151)/(61-74) 114/61 (04/10 1157) SpO2:  [99 %-100 %] 100 % (04/10 1157) Weight:  [81.3 kg] 81.3 kg (04/10 0702) Last BM Date : 03/26/23 General:   Alert,  Well-developed, well-nourished, elderly white female pleasant and cooperative in  NAD-jaundiced Head:  Normocephalic and atraumatic. Eyes:  Sclera icteric.   Conjunctiva pink. Ears:  Normal auditory acuity. Nose:  No deformity, discharge,  or lesions. Mouth:  No deformity or lesions.   Neck:  Supple; no masses or thyromegaly. Lungs:  Clear throughout to auscultation.   No wheezes, crackles, or rhonchi.  Heart:  Regular rate and rhythm; no murmurs, clicks, rubs,  or gallops. Abdomen:  Soft, no significant abdominal tenderness, BS active,nonpalp mass or hsm.   Rectal: Not done Msk:  Symmetrical without gross deformities. . Pulses:  Normal pulses noted. Extremities:  Without clubbing or edema. Neurologic:  Alert and  oriented x4;  grossly normal neurologically. Skin:  Intact without significant lesions or rashes.. Psych:  Alert and cooperative. Normal mood and affect.  Intake/Output from previous day: No  intake/output data recorded. Intake/Output this shift: Total I/O In: 118 [P.O.:118] Out: -   Lab Results: Recent Labs    03/26/23 1923 03/27/23 0536  WBC 4.5 3.2*  HGB 12.0 10.5*  HCT 36.7 31.5*  PLT 237 182   BMET Recent Labs    03/26/23 1923 03/27/23 0536 03/27/23 1046  NA 134* 136 137  K 3.1* 3.8 4.1  CL 101 109 105  CO2 22 19* 21*  GLUCOSE 207* 159* 235*  BUN 31* 34* 34*  CREATININE 2.52* 2.45* 2.52*  CALCIUM 8.8* 8.4* 9.0   LFT Recent Labs    03/27/23 1046  PROT 6.8  ALBUMIN 3.5  AST 120*  ALT 135*  ALKPHOS 264*  BILITOT 9.3*   PT/INR Recent Labs    03/26/23 1923  LABPROT 12.5  INR 0.9     IMPRESSION:  #72 72 year old white female with new onset of jaundice, and pruritus over the past 4 to 5 days. No specific complaints of abdominal pain currently, had had some nausea at home 1 episode of vomiting, no documented fever or chills, mild headache, generally not feeling well recently Workup thus far does not show any evidence of biliary obstruction with both CT of the abdomen and pelvis and MRI/MRCP.  Patient is status post cholecystectomy Normal appearing liver on CT and MRI LFTs with cholestatic pattern  Suspect this is an acute DI LI-induced liver injury likely secondary to Augmentin which can be associated with hepatitis/cholestatic liver injury. Fortunately no worrisome parameters for hepatic failure  #2 recent acute diverticulitis-resolved #3 constipation chronic #4 chronic kidney disease stage III-AKI on admit #5 adult onset diabetes mellitus #6.  Multiple drug intolerances/allergies #7.  Hypothyroidism #8.  History of hypertension #9.  IBS #10.  Prior second subarachnoid hemorrhage #11.  Dementia #12  history of endometrial cancer status post surgery    PLAN: Diet as tolerated Await hepatitis serologies, add CMV, EBV, zoster Fractionate bilirubin Trend LFTs daily, INR No further Augmentin use! Avoid all nonessential  medications GI will follow with you    Amy Esterwood PA-C 03/27/2023, 12:58 PM   Attending physician's note   I have taken history, reviewed the chart and examined the patient. I performed a substantive portion of this encounter, including complete performance of at least one of the key components, in conjunction with the APP. I agree with the Advanced Practitioner's note, impression and recommendations.   Cholestatic jaundice likely d/t DILI (Augmentin). Neg MRCP/CT except for post-chole stable biliary dil. Nl alb/plts/INR. LFTs were Nl 2 weeks ago. No hepatic encephalopathy. Assoc AKI on CKD is concerning.  Recent acute diverticulitis-resolved. Has chronic abdo pain with H/O constipation.  Plan: -Trend CBC, CMP, INR -Await  acute hep profile, CMV, EBV, HSV -Avoid hepatotoxic meds including Augmentin -?Nephrology consultation (in AM) -Liver Bx only if no improvement.   Edman Circle, MD [facility] [phone]

## 2023-03-28 ENCOUNTER — Other Ambulatory Visit: Payer: Self-pay

## 2023-03-28 DIAGNOSIS — R109 Unspecified abdominal pain: Secondary | ICD-10-CM

## 2023-03-28 DIAGNOSIS — R17 Unspecified jaundice: Secondary | ICD-10-CM | POA: Diagnosis not present

## 2023-03-28 LAB — COMPREHENSIVE METABOLIC PANEL
ALT: 117 U/L — ABNORMAL HIGH (ref 0–44)
AST: 114 U/L — ABNORMAL HIGH (ref 15–41)
Albumin: 2.9 g/dL — ABNORMAL LOW (ref 3.5–5.0)
Alkaline Phosphatase: 254 U/L — ABNORMAL HIGH (ref 38–126)
Anion gap: 7 (ref 5–15)
BUN: 40 mg/dL — ABNORMAL HIGH (ref 8–23)
CO2: 21 mmol/L — ABNORMAL LOW (ref 22–32)
Calcium: 8.9 mg/dL (ref 8.9–10.3)
Chloride: 108 mmol/L (ref 98–111)
Creatinine, Ser: 2.62 mg/dL — ABNORMAL HIGH (ref 0.44–1.00)
GFR, Estimated: 19 mL/min — ABNORMAL LOW (ref >60)
Glucose, Bld: 207 mg/dL — ABNORMAL HIGH (ref 70–99)
Potassium: 4 mmol/L (ref 3.5–5.1)
Sodium: 136 mmol/L (ref 135–145)
Total Bilirubin: 9.3 mg/dL — ABNORMAL HIGH (ref 0.3–1.2)
Total Protein: 5.9 g/dL — ABNORMAL LOW (ref 6.5–8.1)

## 2023-03-28 LAB — SODIUM, URINE, RANDOM: Sodium, Ur: 47 mmol/L

## 2023-03-28 LAB — GLUCOSE, CAPILLARY
Glucose-Capillary: 224 mg/dL — ABNORMAL HIGH (ref 70–99)
Glucose-Capillary: 187 mg/dL — ABNORMAL HIGH (ref 70–99)

## 2023-03-28 LAB — CMV DNA, QUANTITATIVE, PCR
CMV DNA Quant: NEGATIVE IU/mL
Log10 CMV Qn DNA Pl: UNDETERMINED log10 IU/mL

## 2023-03-28 LAB — HEPATITIS PANEL, ACUTE
HCV Ab: NONREACTIVE
Hep A IgM: NONREACTIVE
Hep B C IgM: NONREACTIVE
Hepatitis B Surface Ag: NONREACTIVE

## 2023-03-28 LAB — PROTIME-INR
INR: 1 (ref 0.8–1.2)
Prothrombin Time: 12.9 seconds (ref 11.4–15.2)

## 2023-03-28 LAB — CREATININE, URINE, RANDOM: Creatinine, Urine: 129 mg/dL

## 2023-03-28 LAB — EPSTEIN BARR VRS(EBV DNA BY PCR): EBV DNA QN by PCR: NEGATIVE IU/mL

## 2023-03-28 MED ORDER — ACETAMINOPHEN 500 MG PO TABS: 500.0000 mg | ORAL_TABLET | Freq: Two times a day (BID) | ORAL | 0 refills | Status: DC | PRN

## 2023-03-28 MED ORDER — SODIUM CHLORIDE 0.9 % IV SOLN: INTRAVENOUS | Status: DC

## 2023-03-28 NOTE — Discharge Summary (Signed)
Physician Discharge Summary  [name]:[id number] DOB: May 01, 1951 DOA: 03/26/2023  PCP: Jeani Sow, MD  Admit date: 03/26/2023 Discharge date: 03/28/2023  Admitted From: Home Disposition: Home  Recommendations for Outpatient Follow-up:  Follow up with PCP in 1-2 weeks Follow-up with [facility] gastroenterology, Dr. Chales Abrahams as needed Recommend repeat CMP 1-2 weeks to follow creatinine, LFTs Augmentin now listed as an allergy/contraindication as concern for drug-induced liver injury Consider outpatient referral to nephrology if creatinine remains elevated following discharge on repeat labs. Follow-up CMV, EBV, varicella-zoster that were pending at time of discharge   Home Health: No Equipment/Devices: None  Discharge Condition: Stable CODE STATUS: Full code Diet recommendation: Heart healthy diet  History of present illness:  [name] is a 72 y.o. female with past medical history significant for recurrent diverticulitis recently on Augmentin, DM2, HLD, hypothyroidism, CKD stage IIIa, essential tremor, chronic constipation, history of endometrial cancer, multiple allergies who presented to [facility] ED on 4/9 by the recommendation of her primary care physician due to new onset jaundice.  Patient was seen by GI roughly 1 week ago with unremarkable LFTs and went to the PCP office for her daughter's appointment was noted by daughters PCP to be jaundiced.  Patient endorses abdominal pain over the last 2-3 days associated with diffuse pruritus.  Denies any nausea/vomiting.   In the ED, temperature 98.0 F, HR 93, RR 17, BP 137/69, SpO2 100% on room air.  WBC 4.5, hemoglobin 12.0, platelets 237.  Sodium 134, potassium 3.1, chloride 101, CO2 22, glucose 207, BUN 31, creatinine 2.52.  Alkaline phosphatase 229, AST 154, ALT 149, total bilirubin 8.1.  INR 0.9.  CT abdomen/pelvis with no acute localizing process in the abdomen/pelvis, colonic diverticulosis without evidence for acute  diverticulitis, stable mild splenomegaly.  GI was consulted recommended MRCP.  TRH consulted for admission for further evaluation and management of elevated LFTs, total bilirubin with jaundice.  Hospital course:  Elevated LFTs/bilirubin likely secondary to drug-induced liver injury Patient presenting with jaundice, pruritus and was noted to have elevated LFTs on admission with AST 154, ALT 149, total bilirubin 8.1.  CT abdomen/pelvis unrevealing.  RCP with common bile duct dilated up to 12 mm now substantially changed dating back to 12/04/2021 and tapering at the level of the ampulla, no definite filling defect or focal mass lesion, mild splenomegaly, colonic diverticulosis without acute diverticulitis.  Etiology likely secondary to drug-induced liver injury with recent Augmentin use.  Acute hepatitis panel negative.  Total bilirubin peaked at a high of 9.9 during hospitalization, down to 9.3 at time of discharge.  No further workup per GI and okay for discharge home.  Will need outpatient follow-up with PCP versus GI in 1-2 weeks for repeat labs.  Follow-up EBV, varicella-zoster, CMV which were pending at time of discharge.  Acute renal failure on CKD stage III A Etiology likely secondary to prerenal in the setting of dehydration possible overdiuresis on home torsemide.  Creatinine 1.08 with a GFR of 50.  Patient declined IV fluid hydration during the hospitalization; as it caused her to "retain urine".  Her creatinine was stable but elevated from her baseline at time of discharge, 2.62.  Recommend repeat CMP 1-2 weeks.  If remains elevated may need outpatient referral to nephrology for further evaluation.   Type 2 diabetes mellitus with hyperglycemia Hemoglobin A1c 7.2 03/05/2023.  Diet controlled at baseline.  Outpatient follow-up with PCP.   Hypovolemic hyponatremia: Resolved Sodium 134 on admission, likely secondary to poor oral intake  in the days preceding hospitalization.  Sodium improved to 136  at time of discharge.  Recommend repeat CMP 1 week.   Hypokalemia: Solved Repleted during hospitalization.   Chronic constipation MiraLAX daily as needed   Chronic diastolic congestive heart failure, compensated Recent TTE 03/12/2023 with LVEF 65%.  Continue home torsemide.   Hypothyroidism Levothyroxine 50 mcg p.o. daily   GERD Continue PPI.   Obesity Body mass index is 32.79 kg/m.  Discussed with patient needs for aggressive lifestyle changes/weight loss as this complicates all facets of care.  Outpatient follow-up with PCP.    Discharge Diagnoses:  Principal Problem:   Jaundice    Discharge Instructions  Discharge Instructions     Call MD for:  difficulty breathing, headache or visual disturbances   Complete by: As directed    Call MD for:  extreme fatigue   Complete by: As directed    Call MD for:  persistant dizziness or light-headedness   Complete by: As directed    Call MD for:  persistant nausea and vomiting   Complete by: As directed    Call MD for:  severe uncontrolled pain   Complete by: As directed    Call MD for:  temperature >100.4   Complete by: As directed    Diet - low sodium heart healthy   Complete by: As directed    Increase activity slowly   Complete by: As directed       Allergies as of 03/28/2023       Reactions   Atenolol Other (See Comments)   Stomach problems, chills   Atorvastatin Other (See Comments)   Retained fluid   Canagliflozin Other (See Comments)   ALL DIABETIC MEDS PER PT   abdominal pain, headache   Losartan Potassium Other (See Comments)   chills, HA, stomach pain   Acarbose Nausea Only, Other (See Comments)    headache,nausea   Cinnamon Other (See Comments)   Stomach pain   Empagliflozin Other (See Comments)    headaches   Glimepiride Other (See Comments)   ALL DIABETIC MEDS PER PT  abdominal pain, headache   Metformin And Related Nausea And Vomiting, Other (See Comments)   Patient states that she has severe  chills, headache and cramping additionally. Says blood sugar is uncontrolled because of this   Other Other (See Comments)   ALL DIABETIC MEDS PER PT    Pioglitazone Other (See Comments)   ALL DIABETIC MEDS PER PT = peripheral edema   Pravastatin Other (See Comments)   Retained fluid   Sitagliptin Other (See Comments)   ALL DIABETIC MEDS PER PT  Other reaction(s): headache   Spironolactone-hctz Other (See Comments)   Statins Other (See Comments)   Myalgia   Sulfamethoxazole Other (See Comments)   ALL DIABETIC MEDS PER PT    Tramadol Hcl Itching   Atorvastatin Calcium Other (See Comments)    retain fld   Augmentin [amoxicillin-pot Clavulanate] Other (See Comments)   Caused jaundice   Crestor [rosuvastatin] Other (See Comments)   Bumex [bumetanide] Rash, Other (See Comments)   Chills, rash, headaches, stomach issues also   Lasix [furosemide] Rash, Other (See Comments)   Sugars increased   Sulfa Antibiotics Rash, Other (See Comments)   Mother has told patient in the past not to take, she cannot recall there reaction        Medication List     STOP taking these medications    amoxicillin-clavulanate 875-125 MG tablet Commonly known as: AUGMENTIN  estradiol 0.1 MG/GM vaginal cream Commonly known as: ESTRACE   Trulance 3 MG Tabs Generic drug: Plecanatide       TAKE these medications    Accu-Chek Guide test strip Generic drug: glucose blood Use to test blood sugar 3 times daily   acetaminophen 500 MG tablet Commonly known as: TYLENOL Take 1 tablet (500 mg total) by mouth every 12 (twelve) hours as needed for moderate pain. What changed: when to take this   aspirin EC 325 MG tablet Take 1 tablet (325 mg total) by mouth daily. What changed: when to take this   cyanocobalamin 1000 MCG/ML injection Commonly known as: VITAMIN B12 Inject 1,000 mcg into the muscle every 30 (thirty) days.   dicyclomine 20 MG tablet Commonly known as: BENTYL Take 1 tablet (20 mg  total) by mouth every 8 (eight) hours as needed for spasms (AB pain).   levothyroxine 50 MCG tablet Commonly known as: SYNTHROID Take 1 tablet (50 mcg total) by mouth daily. What changed: when to take this   MAGNESIUM CITRATE PO Take 1 tablet by mouth 2 (two) times daily.   multivitamin with minerals Tabs tablet Take 1 tablet by mouth daily.   pantoprazole 20 MG tablet Commonly known as: PROTONIX Take 1 tablet (20 mg total) by mouth 2 (two) times daily. What changed:  when to take this reasons to take this   potassium chloride SA 20 MEQ tablet Commonly known as: KLOR-CON M Take 1 tablet (20 mEq total) by mouth 2 (two) times daily.   pregabalin 50 MG capsule Commonly known as: LYRICA Take 50 mg by mouth daily as needed (for pain).   Repatha SureClick 140 MG/ML Soaj Generic drug: Evolocumab INJECT 1 PEN SUBCUTANEOUSLY EVERY 14 DAYS What changed:  how much to take how to take this when to take this additional instructions   Systane Hydration PF 0.4-0.3 % Soln Generic drug: Polyethyl Glyc-Propyl Glyc PF Place 1-2 drops into both eyes in the morning.   torsemide 20 MG tablet Commonly known as: DEMADEX Take 1 tablet (20 mg total) by mouth daily.   Vitamin D3 25 MCG (1000 UT) capsule Generic drug: Cholecalciferol Take 1,000 Units by mouth daily. What changed: Another medication with the same name was removed. Continue taking this medication, and follow the directions you see here.        Follow-up Information     Jeani Sow, MD. Schedule an appointment as soon as possible for a visit in 1 week(s).   Specialty: Family Medicine Why: For repeat labs Contact information: [address] [place] [address] [phone]         Lynann Bologna, MD Follow up.   Specialties: Gastroenterology, Internal Medicine Contact information: [address] [place] [address] [phone]                Allergies  Allergen Reactions   Atenolol  Other (See Comments)    Stomach problems, chills   Atorvastatin Other (See Comments)    Retained fluid   Canagliflozin Other (See Comments)    ALL DIABETIC MEDS PER PT   abdominal pain, headache   Losartan Potassium Other (See Comments)    chills, HA, stomach pain   Acarbose Nausea Only and Other (See Comments)     headache,nausea   Cinnamon Other (See Comments)    Stomach pain   Empagliflozin Other (See Comments)     headaches   Glimepiride Other (See Comments)    ALL DIABETIC MEDS PER PT  abdominal pain, headache   Metformin And Related Nausea And Vomiting and Other (See Comments)    Patient states that she has severe chills, headache and cramping additionally. Says blood sugar is uncontrolled because of this   Other Other (See Comments)    ALL DIABETIC MEDS PER PT    Pioglitazone Other (See Comments)    ALL DIABETIC MEDS PER PT = peripheral edema   Pravastatin Other (See Comments)    Retained fluid   Sitagliptin Other (See Comments)    ALL DIABETIC MEDS PER PT  Other reaction(s): headache   Spironolactone-Hctz Other (See Comments)   Statins Other (See Comments)    Myalgia   Sulfamethoxazole Other (See Comments)    ALL DIABETIC MEDS PER PT    Tramadol Hcl Itching   Atorvastatin Calcium Other (See Comments)     retain fld   Augmentin [Amoxicillin-Pot Clavulanate] Other (See Comments)    Caused jaundice   Crestor [Rosuvastatin] Other (See Comments)   Bumex [Bumetanide] Rash and Other (See Comments)    Chills, rash, headaches, stomach issues also   Lasix [Furosemide] Rash and Other (See Comments)    Sugars increased   Sulfa Antibiotics Rash and Other (See Comments)    Mother has told patient in the past not to take, she cannot recall there reaction    Consultations: [place] gastroenterology   Procedures/Studies: MR ABDOMEN MRCP W WO CONTAST  Result Date: 03/27/2023 CLINICAL DATA:  Cholelithiasis and elevated LFTs EXAM: MRI ABDOMEN WITHOUT AND WITH CONTRAST  (INCLUDING MRCP) TECHNIQUE: Multiplanar multisequence MR imaging of the abdomen was performed both before and after the administration of intravenous contrast. Heavily T2-weighted images of the biliary and pancreatic ducts were obtained, and three-dimensional MRCP images were rendered by post processing. CONTRAST:  7mL GADAVIST GADOBUTROL 1 MMOL/ML IV SOLN COMPARISON:  CT abdomen and pelvis dated 03/26/2023, 12/04/2021 FINDINGS: Lower chest: No acute findings. Hepatobiliary: No mass or other parenchymal abnormality identified. No intrahepatic bile duct dilation. Common bile duct is dilated up to 12 mm, not substantially changed dating back to 12/04/2021 and tapering to the level of the ampulla. No definite filling defect or focal mass lesion. Cholecystectomy. Pancreas: No mass, inflammatory changes, or other parenchymal abnormality identified. Spleen: Mildly enlarged in the AP dimension measuring 13.9 cm, decreased from 15.2 cm on 03/26/2023 Adrenals/Urinary Tract: No adrenal nodules. No suspicious renal masses identified. No evidence of hydronephrosis. Stomach/Bowel: Colonic diverticulosis without acute diverticulitis. Vascular/Lymphatic: No pathologically enlarged lymph nodes identified. No abdominal aortic aneurysm demonstrated. Other:  None. Musculoskeletal: No suspicious bone lesions identified. Small fat-containing paraumbilical hernia. Susceptibility artifact of the anterior abdominal wall, likely postsurgical. IMPRESSION: 1. Common bile duct is dilated up to 12 mm, not substantially changed dating back to 12/04/2021 and tapering to the level of the ampulla. No definite filling defect or focal mass lesion. 2. Mild splenomegaly, decreased from 15.2 cm on 03/26/2023. 3. Colonic diverticulosis without acute diverticulitis. Electronically Signed   By: Agustin Cree M.D.   On: 03/27/2023 08:14   MR 3D Recon At Scanner  Result Date: 03/27/2023 CLINICAL DATA:  Cholelithiasis and elevated LFTs EXAM: MRI ABDOMEN  WITHOUT AND WITH CONTRAST (INCLUDING MRCP) TECHNIQUE: Multiplanar multisequence MR imaging of the abdomen was performed both before and after the administration of intravenous contrast. Heavily T2-weighted images of the biliary and pancreatic ducts were obtained, and three-dimensional MRCP images were rendered by post processing. CONTRAST:  7mL GADAVIST GADOBUTROL 1 MMOL/ML IV SOLN COMPARISON:  CT abdomen and pelvis dated 03/26/2023, 12/04/2021 FINDINGS:  Lower chest: No acute findings. Hepatobiliary: No mass or other parenchymal abnormality identified. No intrahepatic bile duct dilation. Common bile duct is dilated up to 12 mm, not substantially changed dating back to 12/04/2021 and tapering to the level of the ampulla. No definite filling defect or focal mass lesion. Cholecystectomy. Pancreas: No mass, inflammatory changes, or other parenchymal abnormality identified. Spleen: Mildly enlarged in the AP dimension measuring 13.9 cm, decreased from 15.2 cm on 03/26/2023 Adrenals/Urinary Tract: No adrenal nodules. No suspicious renal masses identified. No evidence of hydronephrosis. Stomach/Bowel: Colonic diverticulosis without acute diverticulitis. Vascular/Lymphatic: No pathologically enlarged lymph nodes identified. No abdominal aortic aneurysm demonstrated. Other:  None. Musculoskeletal: No suspicious bone lesions identified. Small fat-containing paraumbilical hernia. Susceptibility artifact of the anterior abdominal wall, likely postsurgical. IMPRESSION: 1. Common bile duct is dilated up to 12 mm, not substantially changed dating back to 12/04/2021 and tapering to the level of the ampulla. No definite filling defect or focal mass lesion. 2. Mild splenomegaly, decreased from 15.2 cm on 03/26/2023. 3. Colonic diverticulosis without acute diverticulitis. Electronically Signed   By: Agustin Cree M.D.   On: 03/27/2023 08:14   CT ABDOMEN PELVIS WO CONTRAST  Result Date: 03/26/2023 CLINICAL DATA:  Jaundice, abdominal  pain. EXAM: CT ABDOMEN AND PELVIS WITHOUT CONTRAST TECHNIQUE: Multidetector CT imaging of the abdomen and pelvis was performed following the standard protocol without IV contrast. RADIATION DOSE REDUCTION: This exam was performed according to the departmental dose-optimization program which includes automated exposure control, adjustment of the mA and/or kV according to patient size and/or use of iterative reconstruction technique. COMPARISON:  CT abdomen and pelvis 03/21/2023 FINDINGS: Lower chest: No acute abnormality. Hepatobiliary: No focal liver abnormality is seen. Status post cholecystectomy. No biliary dilatation. Pancreas: Unremarkable. No pancreatic ductal dilatation or surrounding inflammatory changes. Spleen: Mildly enlarged, unchanged. Adrenals/Urinary Tract: Prominent vascular calcifications noted in the kidneys. No hydronephrosis or perinephric stranding. The adrenal glands, ureters and bladder are within normal limits. Stomach/Bowel: Stomach is within normal limits. There is no evidence of bowel wall thickening, distention, or inflammatory changes. There is diffuse colonic diverticulosis without evidence for acute diverticulitis. The appendix is not visualized. Vascular/Lymphatic: There are atherosclerotic calcifications of the aorta and branch vessels. Aorta is normal in size. No enlarged lymph nodes are seen. Reproductive: Status post hysterectomy. No adnexal masses. Other: No abdominal wall hernia or abnormality. No abdominopelvic ascites. Musculoskeletal: Mild compression deformity of L1 is unchanged. IMPRESSION: 1. No acute localizing process in the abdomen or pelvis. 2. Colonic diverticulosis without evidence for acute diverticulitis. 3. Stable mild splenomegaly. Aortic Atherosclerosis (ICD10-I70.0). Electronically Signed   By: Darliss Cheney M.D.   On: 03/26/2023 22:23   CT Abdomen Pelvis W Contrast  Result Date: 03/21/2023 CLINICAL DATA:  Abdominal pain, rule out diverticulitis  complications. EXAM: CT ABDOMEN AND PELVIS WITH CONTRAST TECHNIQUE: Multidetector CT imaging of the abdomen and pelvis was performed using the standard protocol following bolus administration of intravenous contrast. RADIATION DOSE REDUCTION: This exam was performed according to the departmental dose-optimization program which includes automated exposure control, adjustment of the mA and/or kV according to patient size and/or use of iterative reconstruction technique. CONTRAST:  ISOVUE-300 IOPAMIDOL (ISOVUE-300) INJECTION 61% COMPARISON:  CT examination dated March 04, 2019 FINDINGS: Lower chest: No acute abnormality. Mitral annular and coronary artery atherosclerotic calcifications. Hepatobiliary: No focal liver abnormality is seen. Low attenuation of hepatic parenchyma suggesting hepatic steatosis. Status post cholecystectomy. No biliary dilatation. Pancreas: Unremarkable. No pancreatic ductal dilatation or surrounding inflammatory changes. Spleen: Normal  in size without focal abnormality. Adrenals/Urinary Tract: Adrenal glands are unremarkable. Kidneys are normal, without renal calculi, focal lesion, or hydronephrosis. Bladder is unremarkable. Stomach/Bowel: Stomach is within normal limits. Small bowel loops are normal in caliber. Appendix not identified. Interval near complete resolution of the proximal transverse colon diverticulitis. Colonic diverticulosis without evidence of acute diverticulitis. No bowel wall thickening or abdominal collection/abscess. Vascular/Lymphatic: Aortic atherosclerosis. No enlarged abdominal or pelvic lymph nodes. Reproductive: Status post hysterectomy. No adnexal masses. Other: No abdominal wall hernia or abnormality. No abdominopelvic ascites. Musculoskeletal: Chronic compression deformity of the L1 vertebral body, unchanged. Multilevel degenerative disease of the lower lumbar spine with associated facet joint arthropathy. No acute osseous abnormality. IMPRESSION: 1.  Interval near complete resolution of the proximal transverse colon diverticulitis. 2. Colonic diverticulosis without evidence of acute diverticulitis. 3. Hepatic steatosis. 4. Chronic compression deformity of the L1 vertebral body, unchanged. 5. Aortic and coronary artery atherosclerotic calcifications. Electronically Signed   By: Larose HiresImran  Ahmed D.O.   On: 03/21/2023 11:47   DG Abd 1 View  Result Date: 03/16/2023 CLINICAL DATA:  Constipation, pain. EXAM: ABDOMEN - 1 VIEW COMPARISON:  01/16/2023. FINDINGS: Increased stool consistent with constipation. There is retained barium and scattered diverticula. Extensive postop changes right midabdomen. Calcifications overlie the left kidney could be nephrolithiasis, similar to the prior study. IMPRESSION: Constipation. Scattered diverticula with retained contrast. Left-sided nephrolithiasis. Electronically Signed   By: Layla MawJoshua  Pleasure M.D.   On: 03/16/2023 17:29   ECHOCARDIOGRAM LIMITED BUBBLE STUDY  Result Date: 03/13/2023    ECHOCARDIOGRAM LIMITED REPORT   Patient Name:   Janeece FittingCORINNE H Lusher Date of Exam: 03/12/2023 Medical Rec #:  [id number]         Height:       62.0 in Accession #:    [id number]        Weight:       184.0 lb Date of Birth:  03/10/1951        BSA:          1.845 m Patient Age:    71 years          BP:           130/80 mmHg Patient Gender: F                 HR:           91 bpm. [address] Procedure: Limited Echo, Limited Color Doppler, Cardiac Doppler and Saline            Contrast Bubble Study Indications:    PFO Q21.1  History:        Patient has prior history of Echocardiogram examinations, most                 recent 02/01/2023. Risk Factors:Diabetes, Hypertension and                 Dyslipidemia.  Sonographer:    Thurman Coyerasey Kirkpatrick RDCS Referring Phys: [id number] TESSA N CONTE IMPRESSIONS  1. Left ventricular ejection fraction, by estimation, is 60 to 65%. The left ventricle has normal function. Left ventricular diastolic parameters  were normal.  2. Right ventricular systolic function is normal.  3. The mitral valve is normal in structure.  4. The aortic valve was not assessed.  5. Positive color doppler with left to right flow.. Agitated saline contrast bubble study was negative, with no evidence of any interatrial shunt. FINDINGS  Left Ventricle: Left ventricular ejection fraction, by estimation, is 60  to 65%. The left ventricle has normal function. Left ventricular diastolic parameters were normal. Right Ventricle: Right ventricular systolic function is normal. Pericardium: There is no evidence of pericardial effusion. Mitral Valve: The mitral valve is normal in structure. Tricuspid Valve: The tricuspid valve is normal in structure. Aortic Valve: The aortic valve was not assessed. Pulmonic Valve: The pulmonic valve was not assessed. Aorta: The aortic root was not well visualized. IAS/Shunts: Positive color doppler with left to right flow. Agitated saline contrast was given intravenously to evaluate for intracardiac shunting. Agitated saline contrast bubble study was negative, with no evidence of any interatrial shunt. Dorthula Nettles Electronically signed by Dorthula Nettles Signature Date/Time: 03/13/2023/12:29:18 PM    Final    CT Abdomen Pelvis W Contrast  Result Date: 03/05/2023 CLINICAL DATA:  Abdominal pain. EXAM: CT ABDOMEN AND PELVIS WITH CONTRAST TECHNIQUE: Multidetector CT imaging of the abdomen and pelvis was performed using the standard protocol following bolus administration of intravenous contrast. RADIATION DOSE REDUCTION: This exam was performed according to the departmental dose-optimization program which includes automated exposure control, adjustment of the mA and/or kV according to patient size and/or use of iterative reconstruction technique. CONTRAST:  ISOVUE-300 IOPAMIDOL (ISOVUE-300) INJECTION 61% COMPARISON:  Pelvis dated 12/04/2021. FINDINGS: Lower chest: The visualized lung bases are clear. There is  coronary vascular calcification and calcification of the mitral annulus. No intra-abdominal free air or free fluid. Hepatobiliary: Apparent mild fatty liver. No biliary dilatation. Cholecystectomy. No retained calcified stone noted in the central CBD. Pancreas: Unremarkable. No pancreatic ductal dilatation or surrounding inflammatory changes. Spleen: Normal in size without focal abnormality. Adrenals/Urinary Tract: The adrenal glands are unremarkable. Mild bilateral renal parenchyma atrophy. There is no hydronephrosis on either side. There is symmetric enhancement and excretion of contrast by both kidneys. The visualized ureters and urinary bladder appear unremarkable. Stomach/Bowel: There is sigmoid diverticulosis without active inflammatory changes. There is focal area of inflammatory change abutting the proximal transverse colon likely associated with a diverticula (45/2 and coronal 34/3) and most consistent with diverticulitis. Underlying mass is not excluded. Follow-up with CT or colonoscopy after resolution of acute symptoms recommended. There is no bowel obstruction. The appendix is not visualized with certainty. No inflammatory changes identified in the right lower quadrant. Vascular/Lymphatic: Mild aortoiliac atherosclerotic disease. The IVC is unremarkable. No portal venous gas. There is no adenopathy. Reproductive: Hysterectomy.  No adnexal masses. Other: None Musculoskeletal: Osteopenia with degenerative changes of the spine. No acute osseous pathology. Old L1 compression fracture. IMPRESSION: 1. Diverticulitis of the proximal transverse colon. Follow-up recommended. No perforation or abscess. No bowel obstruction. 2.  Aortic Atherosclerosis (ICD10-I70.0). Electronically Signed   By: Elgie Collard M.D.   On: 03/05/2023 23:31     Subjective: Patient seen examined at bedside, resting calmly.  Lying in bed.  No specific complaints this morning.  Seen by GI and LFTs/bilirubin slowly improving.   Discussed with Dr. Chales Abrahams, GI this morning believe this is all related to drug-induced liver injury from Augmentin use.  GI recommends discharge home with outpatient follow-up 1-2 weeks with repeat labs.  Patient with no other questions or concerns at this time.  Denies visual changes, no dizziness, no chest pain, no palpitations, no shortness of breath, no abdominal pain, no fever/chills/night sweats, no nausea cefonicid diarrhea, no focal weakness, no fatigue, no paresthesias.  No acute events overnight per nursing staff.  Discharge Exam: Vitals:   03/27/23 2037 03/28/23 0541  BP: 131/79 (!) 147/70  Pulse: 92 88  Resp: 18  18  Temp: 97.8 F (36.6 C) 97.6 F (36.4 C)  SpO2: 99% 99%   Vitals:   03/27/23 1454 03/27/23 2037 03/28/23 0500 03/28/23 0541  BP: 123/75 131/79  (!) 147/70  Pulse: 86 92  88  Resp: 15 18  18   Temp: (!) 97.5 F (36.4 C) 97.8 F (36.6 C)  97.6 F (36.4 C)  TempSrc: Oral Oral  Oral  SpO2: 100% 99%  99%  Weight:   80 kg     Physical Exam: GEN: NAD, alert and oriented x 3, obese, chronically ill in appearance HEENT: NCAT, PERRL, EOMI, + scleral icterus, MMM PULM: CTAB w/o wheezes/crackles, normal respiratory effort, on room air CV: RRR w/o M/G/R GI: abd soft, NTND, NABS, no R/G/M MSK: no peripheral edema, muscle strength globally intact 5/5 bilateral upper/lower extremities NEURO: CN II-XII intact, no focal deficits, sensation to light touch intact PSYCH: normal mood/affect Integumentary: + Jaundice, otherwise no other concerning rashes/lesions/wounds on exposed skin surfaces     The results of significant diagnostics from this hospitalization (including imaging, microbiology, ancillary and laboratory) are listed below for reference.     Microbiology: No results found for this or any previous visit (from the past 240 hour(s)).   Labs: BNP (last 3 results) No results for input(s): "BNP" in the last 8760 hours. Basic Metabolic Panel: Recent Labs  Lab  03/26/23 1923 03/27/23 0536 03/27/23 1046 03/28/23 0553  NA 134* 136 137 136  K 3.1* 3.8 4.1 4.0  CL 101 109 105 108  CO2 22 19* 21* 21*  GLUCOSE 207* 159* 235* 207*  BUN 31* 34* 34* 40*  CREATININE 2.52* 2.45* 2.52* 2.62*  CALCIUM 8.8* 8.4* 9.0 8.9  MG  --  1.5*  --   --   PHOS  --  5.0*  --   --    Liver Function Tests: Recent Labs  Lab 03/26/23 1923 03/27/23 0536 03/27/23 1046 03/27/23 1403 03/28/23 0553  AST 154* 116* 120*  --  114*  ALT 149* 120* 135*  --  117*  ALKPHOS 229* 223* 264*  --  254*  BILITOT 8.1* 7.6* 9.3* 9.9* 9.3*  PROT 7.2 5.9* 6.8  --  5.9*  ALBUMIN 3.6 2.9* 3.5  --  2.9*   No results for input(s): "LIPASE", "AMYLASE" in the last 168 hours. No results for input(s): "AMMONIA" in the last 168 hours. CBC: Recent Labs  Lab 03/26/23 1923 03/27/23 0536  WBC 4.5 3.2*  NEUTROABS 3.0  --   HGB 12.0 10.5*  HCT 36.7 31.5*  MCV 90.2 89.7  PLT 237 182   Cardiac Enzymes: No results for input(s): "CKTOTAL", "CKMB", "CKMBINDEX", "TROPONINI" in the last 168 hours. BNP: Invalid input(s): "POCBNP" CBG: Recent Labs  Lab 03/27/23 0751 03/27/23 1140 03/27/23 1652 03/27/23 2038 03/28/23 0716  GLUCAP 165* 211* 272* 166* 224*   D-Dimer No results for input(s): "DDIMER" in the last 72 hours. Hgb A1c No results for input(s): "HGBA1C" in the last 72 hours. Lipid Profile No results for input(s): "CHOL", "HDL", "LDLCALC", "TRIG", "CHOLHDL", "LDLDIRECT" in the last 72 hours. Thyroid function studies No results for input(s): "TSH", "T4TOTAL", "T3FREE", "THYROIDAB" in the last 72 hours.  Invalid input(s): "FREET3" Anemia work up No results for input(s): "VITAMINB12", "FOLATE", "FERRITIN", "TIBC", "IRON", "RETICCTPCT" in the last 72 hours. Urinalysis    Component Value Date/Time   COLORURINE YELLOW 06/22/2022 1556   APPEARANCEUR Sl Cloudy (A) 06/22/2022 1556   APPEARANCEUR Clear 11/24/2021 1139   LABSPEC 1.015 06/22/2022 1556  PHURINE 6.0 06/22/2022  1556   GLUCOSEU NEGATIVE 06/22/2022 1556   HGBUR TRACE-INTACT (A) 06/22/2022 1556   BILIRUBINUR Negative 10/29/2022 1458   BILIRUBINUR Negative 11/24/2021 1139   KETONESUR NEGATIVE 06/22/2022 1556   PROTEINUR Positive (A) 10/29/2022 1458   PROTEINUR Trace 11/24/2021 1139   PROTEINUR NEGATIVE 10/18/2020 1354   UROBILINOGEN 0.2 10/29/2022 1458   UROBILINOGEN 1.0 06/22/2022 1556   NITRITE Negative 10/29/2022 1458   NITRITE NEGATIVE 06/22/2022 1556   LEUKOCYTESUR Trace (A) 10/29/2022 1458   LEUKOCYTESUR MODERATE (A) 06/22/2022 1556   Sepsis Labs Recent Labs  Lab 03/26/23 1923 03/27/23 0536  WBC 4.5 3.2*   Microbiology No results found for this or any previous visit (from the past 240 hour(s)).   Time coordinating discharge: Over 30 minutes  SIGNED:   Alvira Philips [place], DO  [facility] 03/28/2023, 9:56 AM

## 2023-03-28 NOTE — Progress Notes (Addendum)
Progress Note  Primary GI: Dr. Lavon PaganiniNandigam   Subjective  Chief Complaint:Elevated LFTs/jaundice   No family was present at the time of my evaluation. Patient lying in bed, states she is not feeling as well today she did yesterday.  Complains of abdominal discomfort, right flank and right upper quadrant.  Has headache nausea without vomiting, chills without fever.  She did receive pain medications and nausea medications last night was able to sleep. Last bowel movement 4 days ago.    Objective   Vital signs in last 24 hours: Temp:  [97.5 F (36.4 C)-97.8 F (36.6 C)] 97.6 F (36.4 C) (04/11 0541) Pulse Rate:  [82-92] 88 (04/11 0541) Resp:  [15-18] 18 (04/11 0541) BP: (114-147)/(61-79) 147/70 (04/11 0541) SpO2:  [99 %-100 %] 99 % (04/11 0541) Weight:  [80 kg] 80 kg (04/11 0500) Last BM Date : 03/26/23 Last BM recorded by nurses in past 5 days No data recorded  General: Elderly jaundiced female in no acute distress  Heart:  Regular rate and rhythm; no murmurs Pulm: Clear anteriorly; no wheezing Abdomen:  Soft, Obese AB, Sluggish bowel sounds. mild tenderness in the RUQ and in the RLQ. Without guarding and Without rebound, No organomegaly appreciated. Extremities:  without  edema. Neurologic:  Alert and  oriented x4;  No focal deficits.  Psych:  Cooperative. Normal mood and affect.  Intake/Output from previous day: 04/10 0701 - 04/11 0700 In: 678 [P.O.:678] Out: -  Intake/Output this shift: No intake/output data recorded.  Studies/Results: MR ABDOMEN MRCP W WO CONTAST  Result Date: 03/27/2023 CLINICAL DATA:  Cholelithiasis and elevated LFTs EXAM: MRI ABDOMEN WITHOUT AND WITH CONTRAST (INCLUDING MRCP) TECHNIQUE: Multiplanar multisequence MR imaging of the abdomen was performed both before and after the administration of intravenous contrast. Heavily T2-weighted images of the biliary and pancreatic ducts were obtained, and three-dimensional MRCP images were rendered by post  processing. CONTRAST:  7mL GADAVIST GADOBUTROL 1 MMOL/ML IV SOLN COMPARISON:  CT abdomen and pelvis dated 03/26/2023, 12/04/2021 FINDINGS: Lower chest: No acute findings. Hepatobiliary: No mass or other parenchymal abnormality identified. No intrahepatic bile duct dilation. Common bile duct is dilated up to 12 mm, not substantially changed dating back to 12/04/2021 and tapering to the level of the ampulla. No definite filling defect or focal mass lesion. Cholecystectomy. Pancreas: No mass, inflammatory changes, or other parenchymal abnormality identified. Spleen: Mildly enlarged in the AP dimension measuring 13.9 cm, decreased from 15.2 cm on 03/26/2023 Adrenals/Urinary Tract: No adrenal nodules. No suspicious renal masses identified. No evidence of hydronephrosis. Stomach/Bowel: Colonic diverticulosis without acute diverticulitis. Vascular/Lymphatic: No pathologically enlarged lymph nodes identified. No abdominal aortic aneurysm demonstrated. Other:  None. Musculoskeletal: No suspicious bone lesions identified. Small fat-containing paraumbilical hernia. Susceptibility artifact of the anterior abdominal wall, likely postsurgical. IMPRESSION: 1. Common bile duct is dilated up to 12 mm, not substantially changed dating back to 12/04/2021 and tapering to the level of the ampulla. No definite filling defect or focal mass lesion. 2. Mild splenomegaly, decreased from 15.2 cm on 03/26/2023. 3. Colonic diverticulosis without acute diverticulitis. Electronically Signed   By: Agustin CreeLimin  Xu M.D.   On: 03/27/2023 08:14   MR 3D Recon At Scanner  Result Date: 03/27/2023 CLINICAL DATA:  Cholelithiasis and elevated LFTs EXAM: MRI ABDOMEN WITHOUT AND WITH CONTRAST (INCLUDING MRCP) TECHNIQUE: Multiplanar multisequence MR imaging of the abdomen was performed both before and after the administration of intravenous contrast. Heavily T2-weighted images of the biliary and pancreatic ducts were obtained, and three-dimensional MRCP images  were rendered by post processing. CONTRAST:  39mL GADAVIST GADOBUTROL 1 MMOL/ML IV SOLN COMPARISON:  CT abdomen and pelvis dated 03/26/2023, 12/04/2021 FINDINGS: Lower chest: No acute findings. Hepatobiliary: No mass or other parenchymal abnormality identified. No intrahepatic bile duct dilation. Common bile duct is dilated up to 12 mm, not substantially changed dating back to 12/04/2021 and tapering to the level of the ampulla. No definite filling defect or focal mass lesion. Cholecystectomy. Pancreas: No mass, inflammatory changes, or other parenchymal abnormality identified. Spleen: Mildly enlarged in the AP dimension measuring 13.9 cm, decreased from 15.2 cm on 03/26/2023 Adrenals/Urinary Tract: No adrenal nodules. No suspicious renal masses identified. No evidence of hydronephrosis. Stomach/Bowel: Colonic diverticulosis without acute diverticulitis. Vascular/Lymphatic: No pathologically enlarged lymph nodes identified. No abdominal aortic aneurysm demonstrated. Other:  None. Musculoskeletal: No suspicious bone lesions identified. Small fat-containing paraumbilical hernia. Susceptibility artifact of the anterior abdominal wall, likely postsurgical. IMPRESSION: 1. Common bile duct is dilated up to 12 mm, not substantially changed dating back to 12/04/2021 and tapering to the level of the ampulla. No definite filling defect or focal mass lesion. 2. Mild splenomegaly, decreased from 15.2 cm on 03/26/2023. 3. Colonic diverticulosis without acute diverticulitis. Electronically Signed   By: Agustin Cree M.D.   On: 03/27/2023 08:14   CT ABDOMEN PELVIS WO CONTRAST  Result Date: 03/26/2023 CLINICAL DATA:  Jaundice, abdominal pain. EXAM: CT ABDOMEN AND PELVIS WITHOUT CONTRAST TECHNIQUE: Multidetector CT imaging of the abdomen and pelvis was performed following the standard protocol without IV contrast. RADIATION DOSE REDUCTION: This exam was performed according to the departmental dose-optimization program which includes  automated exposure control, adjustment of the mA and/or kV according to patient size and/or use of iterative reconstruction technique. COMPARISON:  CT abdomen and pelvis 03/21/2023 FINDINGS: Lower chest: No acute abnormality. Hepatobiliary: No focal liver abnormality is seen. Status post cholecystectomy. No biliary dilatation. Pancreas: Unremarkable. No pancreatic ductal dilatation or surrounding inflammatory changes. Spleen: Mildly enlarged, unchanged. Adrenals/Urinary Tract: Prominent vascular calcifications noted in the kidneys. No hydronephrosis or perinephric stranding. The adrenal glands, ureters and bladder are within normal limits. Stomach/Bowel: Stomach is within normal limits. There is no evidence of bowel wall thickening, distention, or inflammatory changes. There is diffuse colonic diverticulosis without evidence for acute diverticulitis. The appendix is not visualized. Vascular/Lymphatic: There are atherosclerotic calcifications of the aorta and branch vessels. Aorta is normal in size. No enlarged lymph nodes are seen. Reproductive: Status post hysterectomy. No adnexal masses. Other: No abdominal wall hernia or abnormality. No abdominopelvic ascites. Musculoskeletal: Mild compression deformity of L1 is unchanged. IMPRESSION: 1. No acute localizing process in the abdomen or pelvis. 2. Colonic diverticulosis without evidence for acute diverticulitis. 3. Stable mild splenomegaly. Aortic Atherosclerosis (ICD10-I70.0). Electronically Signed   By: Darliss Cheney M.D.   On: 03/26/2023 22:23    Lab Results: Recent Labs    03/26/23 1923 03/27/23 0536  WBC 4.5 3.2*  HGB 12.0 10.5*  HCT 36.7 31.5*  PLT 237 182   BMET Recent Labs    03/27/23 0536 03/27/23 1046 03/28/23 0553  NA 136 137 136  K 3.8 4.1 4.0  CL 109 105 108  CO2 19* 21* 21*  GLUCOSE 159* 235* 207*  BUN 34* 34* 40*  CREATININE 2.45* 2.52* 2.62*  CALCIUM 8.4* 9.0 8.9   LFT Recent Labs    03/27/23 1403 03/28/23 0553  PROT   --  5.9*  ALBUMIN  --  2.9*  AST  --  114*  ALT  --  117*  ALKPHOS  --  254*  BILITOT 9.9* 9.3*  BILIDIR 6.4*  --   IBILI 3.5*  --    PT/INR Recent Labs    03/26/23 1923 03/28/23 0553  LABPROT 12.5 12.9  INR 0.9 1.0     Scheduled Meds:  insulin aspart  0-5 Units Subcutaneous QHS   insulin aspart  0-9 Units Subcutaneous TID WC   levothyroxine  50 mcg Oral Q0600   pantoprazole  20 mg Oral BID   Continuous Infusions:  sodium chloride        Patient profile:   72 year old white female with new onset of jaundice, and pruritus over the past 4 to 5 days after Augmentin use for diverticulitis presents with elevated LFTs/jaundice. CT abdomen pelvis and MRCP/MRI without any evidence of biliary obstruction, patient status post cholecystectomy   Impression/Plan:   Jaundice and pruritus 4 to 5 days Recent Labs  Lab 03/26/23 1923 03/27/23 0536 03/27/23 1046 03/27/23 1403 03/28/23 0553  AST 154* 116* 120*  --  114*  ALT 149* 120* 135*  --  117*  ALKPHOS 229* 223* 264*  --  254*  BILITOT 8.1* 7.6* 9.3* 9.9* 9.3*  PROT 7.2 5.9* 6.8  --  5.9*  ALBUMIN 3.6 2.9* 3.5  --  2.9*   AST 114 ALT 117 Alkphos 254 TBili 9.3-direct bili 6.4, indirect 3.5 03/28/2023 INR 1.0 Unremarkable CT/MRCP Negative acute hepatitis panel Cholestatic pattern, no evidence of hepatic dysfunction, INR stable Most likely Dili induced liver injury secondary to Augmentin Pending CMV, VCD, EBV LFTs down trending Follow-up on serology, will plan on 1 week outpatient lab follow-up with our office. Patient has follow-up office visit 5/20 in our office.   [facility] will sign off.  Please contact us if we can be of any further assistance during this hospital stay.  Recent acute diverticulitis Resolved  Acute on chronic kidney BUN 40 Cr 2.62  GFR 19  Potassium 4.0  Magnesium 1.5  Pending urine sodium and creatinine Consider nephrology consult  Type 2 diabetes  Multiple drug  intolerances/allergies Augmentin has been added to contraindication/allergy  Principal Problem:   Jaundice    LOS: 2 days   Doree Albee  03/28/2023, 8:24 AM   Attending physician's note   I have taken history, reviewed the chart and examined the patient. I performed a substantive portion of this encounter, including complete performance of at least one of the key components, in conjunction with the APP. I agree with the Advanced Practitioner's note, impression and recommendations.   LFTs trending down. 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 FU GI 5/20 Will sign off for now   Edman Circle, MD [facility] [phone]

## 2023-03-29 LAB — VARICELLA-ZOSTER BY PCR: Varicella-Zoster, PCR: NEGATIVE

## 2023-04-01 ENCOUNTER — Telehealth: Payer: Self-pay

## 2023-04-01 NOTE — Transitions of Care (Post Inpatient/ED Visit) (Signed)
   04/01/2023  Name: [name] MRN: [id number] DOB: [birth year]  Today's TOC FU Call Status: Today's TOC FU Call Status:: Unsuccessul Call (1st Attempt) Unsuccessful Call (1st Attempt) Date: 04/01/23  Attempted to reach the patient regarding the most recent Inpatient/ED visit.  Follow Up Plan: Additional outreach attempts will be made to reach the patient to complete the Transitions of Care (Post Inpatient/ED visit) call.   Agnes Lawrence, CMA (AAMA)  [facility] [phone]

## 2023-04-02 ENCOUNTER — Other Ambulatory Visit: Payer: Self-pay | Admitting: Gastroenterology

## 2023-04-02 NOTE — Progress Notes (Signed)
Reviewed and agree with documentation and assessment and plan. K. Veena Ketina Mars , MD   

## 2023-04-05 ENCOUNTER — Ambulatory Visit (INDEPENDENT_AMBULATORY_CARE_PROVIDER_SITE_OTHER): Payer: 59 | Admitting: Family Medicine

## 2023-04-05 ENCOUNTER — Encounter: Payer: Self-pay | Admitting: Family Medicine

## 2023-04-05 VITALS — BP 142/86 | HR 66 | Temp 98.1°F | Ht 62.0 in | Wt 187.6 lb

## 2023-04-05 DIAGNOSIS — R600 Localized edema: Secondary | ICD-10-CM | POA: Diagnosis not present

## 2023-04-05 DIAGNOSIS — N289 Disorder of kidney and ureter, unspecified: Secondary | ICD-10-CM

## 2023-04-05 DIAGNOSIS — E538 Deficiency of other specified B group vitamins: Secondary | ICD-10-CM | POA: Diagnosis not present

## 2023-04-05 DIAGNOSIS — R17 Unspecified jaundice: Secondary | ICD-10-CM | POA: Diagnosis not present

## 2023-04-05 LAB — COMPREHENSIVE METABOLIC PANEL
ALT: 81 U/L — ABNORMAL HIGH (ref 0–35)
AST: 64 U/L — ABNORMAL HIGH (ref 0–37)
Albumin: 3.8 g/dL (ref 3.5–5.2)
Alkaline Phosphatase: 282 U/L — ABNORMAL HIGH (ref 39–117)
BUN: 52 mg/dL — ABNORMAL HIGH (ref 6–23)
CO2: 24 mEq/L (ref 19–32)
Calcium: 9.2 mg/dL (ref 8.4–10.5)
Chloride: 100 mEq/L (ref 96–112)
Creatinine, Ser: 2.05 mg/dL — ABNORMAL HIGH (ref 0.40–1.20)
GFR: 23.62 mL/min — ABNORMAL LOW (ref >60.00)
Glucose, Bld: 299 mg/dL — ABNORMAL HIGH (ref 70–99)
Potassium: 4.3 mEq/L (ref 3.5–5.1)
Sodium: 133 mEq/L — ABNORMAL LOW (ref 135–145)
Total Bilirubin: 3.6 mg/dL — ABNORMAL HIGH (ref 0.2–1.2)
Total Protein: 6.6 g/dL (ref 6.0–8.3)

## 2023-04-05 LAB — MAGNESIUM: Magnesium: 1.7 mg/dL (ref 1.5–2.5)

## 2023-04-05 LAB — CBC WITH DIFFERENTIAL/PLATELET
Basophils Absolute: 0 10*3/uL (ref 0.0–0.1)
Basophils Relative: 1 % (ref 0.0–3.0)
Eosinophils Absolute: 0.2 10*3/uL (ref 0.0–0.7)
Eosinophils Relative: 3.9 % (ref 0.0–5.0)
HCT: 31 % — ABNORMAL LOW (ref 36.0–46.0)
Hemoglobin: 10.5 g/dL — ABNORMAL LOW (ref 12.0–15.0)
Lymphocytes Relative: 18.4 % (ref 12.0–46.0)
Lymphs Abs: 0.8 10*3/uL (ref 0.7–4.0)
MCHC: 33.8 g/dL (ref 30.0–36.0)
MCV: 90.5 fl (ref 78.0–100.0)
Monocytes Absolute: 0.4 10*3/uL (ref 0.1–1.0)
Monocytes Relative: 8.3 % (ref 3.0–12.0)
Neutro Abs: 3.1 10*3/uL (ref 1.4–7.7)
Neutrophils Relative %: 68.4 % (ref 43.0–77.0)
Platelets: 260 10*3/uL (ref 150.0–400.0)
RBC: 3.42 Mil/uL — ABNORMAL LOW (ref 3.87–5.11)
RDW: 16.9 % — ABNORMAL HIGH (ref 11.5–15.5)
WBC: 4.6 10*3/uL (ref 4.0–10.5)

## 2023-04-05 MED ORDER — CYANOCOBALAMIN 1000 MCG/ML IJ SOLN
1000.0000 ug | INTRAMUSCULAR | Status: DC
Start: 2023-04-05 — End: 2024-07-15
  Administered 2023-04-05 – 2024-07-15 (×10): 1000 ug via INTRAMUSCULAR

## 2023-04-05 NOTE — Patient Instructions (Signed)
See Retina doctor

## 2023-04-05 NOTE — Progress Notes (Signed)
Subjective:     Patient ID: [name], female    DOB: 09-May-1951, 72 y.o.   MRN: [id number]  Chief Complaint  Patient presents with   Hospitalization Follow-up    Recommendations for Outpatient Follow-up:  1. Follow up with PCP in 1-2 weeks 2. Follow-up with [facility] gastroenterology, Dr. Chales Abrahams as needed 3. Recommend repeat CMP 1-2 weeks to follow creatinine, LFTs 4. Augmentin now listed as an allergy/contraindication as concern for drug-induced liver injury 5. Consider outpatient referral to nephrology if creatinine remains elevated following discharge on repeat labs. 6. Follow-up CMV, EBV, varicella-zoster that were pending at time of discharge    HPI [facility] for jaundice 4/9-4/11.  Patient was here accomanying daughter on 4/9 and I noted jaundiced and told to go to ER.  Admitted for bili 8-9.  Had CT abdomen/pelvis and MRCP.  No etiology.  Saw gastroenterology.  Thought to possibly be due to Augmentin.  Alk phos 229, ast 154  alt 149, INR 0.9.  also, acute on chronic CHRONIC KIDNEY DISEASE 3A.  Creatinine maxed 2.62.  here for repeat. Hepatitis,cmv,ebv all negative as well.  Patient not feeling as poorly  Some itching.  Abdomen pain still but some improvement past 4 days.  2.  Edema-Torsemide 10-20 mg daily or 10 mg twice daily.         Health Maintenance Due  Topic Date Due   Medicare Annual Wellness (AWV)  Never done   OPHTHALMOLOGY EXAM  Never done    Past Medical History:  Diagnosis Date   Allergy    Anemia    Arthritis    Asthma    Dianosed years ago-no meds at this time   Cancer 2021   endometrial    Cataract    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 name of MD    Dementia    Diabetes mellitus    type 2    Diverticulosis    Dyslipidemia    Dyspnea    with exertion    Dysrhythmia    "skips beats"   Endometrial  cancer 2021   Family history of adverse reaction to anesthesia    son stopped breathing during surgery , mother slow to wake up    Fibromyalgia 1980's   Generalized anxiety disorder    GERD (gastroesophageal reflux disease)    occ- will use Tums and Prolosec  prn    Heart murmur    Hyperlipemia    Hypertension    not on blood pressure meds due to allergies - last dose of meds- months ago    Hypothyroidism    Hypothyroidism (acquired)    Internal hemorrhoids    Irritable bowel syndrome 1980's   Liver cancer    Major depressive disorder    Migraine headaches 1980's   On meds-well controlled   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   Morbid obesity    Pernicious anemia    Pneumonia 2009   Several times over the past several years   PONV (postoperative nausea and vomiting)    was told by neurologist due to calcificaton in brain not to be put to sleep, N/V during Op and Recovery    Recurrent upper respiratory infection (URI)    Sleep apnea    no cpap   Subarachnoid hemorrhage 2010   Vaginal polyp    benign per  pt    Past Surgical History:  Procedure Laterality Date   CATARACT EXTRACTION Right    CHOLECYSTECTOMY  1983   COLONOSCOPY  2008   DB   DILATION AND CURETTAGE OF UTERUS  1972   HYSTEROSCOPY WITH D & C  06/29/2011   Procedure: DILATATION AND CURETTAGE (D&C) /HYSTEROSCOPY;  Surgeon: Reva Bores, MD;  Location: [facility];  Service: Gynecology;  Laterality: N/A;   ROBOTIC ASSISTED TOTAL HYSTERECTOMY WITH BILATERAL SALPINGO OOPHERECTOMY N/A 10/27/2020   Procedure: XI ROBOTIC ASSISTED TOTAL HYSTERECTOMY WITH BILATERAL SALPINGO OOPHORECTOMY;  Surgeon: Adolphus Birchwood, MD;  Location: [facility];  Service: Gynecology;  Laterality: N/A;   SENTINEL NODE BIOPSY N/A 10/27/2020   Procedure: SENTINEL NODE BIOPSY;  Surgeon: Adolphus Birchwood, MD;  Location: [facility];  Service: Gynecology;  Laterality: N/A;     Current Outpatient Medications:    Cholecalciferol (VITAMIN D3) 25 MCG (1000 UT)  capsule, Take 1,000 Units by mouth daily., Disp: , Rfl:    cyanocobalamin (,VITAMIN B-12,) 1000 MCG/ML injection, Inject 1,000 mcg into the muscle every 30 (thirty) days. , Disp: , Rfl:    dicyclomine (BENTYL) 20 MG tablet, Take 1 tablet (20 mg total) by mouth every 8 (eight) hours as needed for spasms (AB pain)., Disp: 30 tablet, Rfl: 0   Evolocumab (REPATHA SURECLICK) 140 MG/ML SOAJ, INJECT 1 PEN SUBCUTANEOUSLY EVERY 14 DAYS (Patient taking differently: Inject 140 mg into the skin every 14 (fourteen) days.), Disp: 6 mL, Rfl: 3   glucose blood (ACCU-CHEK GUIDE) test strip, Use to test blood sugar 3 times daily, Disp: 100 each, Rfl: 2   levothyroxine (SYNTHROID) 50 MCG tablet, Take 1 tablet (50 mcg total) by mouth daily. (Patient taking differently: Take 50 mcg by mouth daily before breakfast.), Disp: 90 tablet, Rfl: 3   MAGNESIUM CITRATE PO, Take 1 tablet by mouth 2 (two) times daily., Disp: , Rfl:    Multiple Vitamin (MULTIVITAMIN WITH MINERALS) TABS tablet, Take 1 tablet by mouth daily., Disp: 30 tablet, Rfl: 0   potassium chloride SA (KLOR-CON M) 20 MEQ tablet, Take 1 tablet (20 mEq total) by mouth 2 (two) times daily., Disp: 180 tablet, Rfl: 1   pregabalin (LYRICA) 50 MG capsule, Take 50 mg by mouth daily as needed (for pain)., Disp: , Rfl:    SYSTANE HYDRATION PF 0.4-0.3 % SOLN, Place 1-2 drops into both eyes in the morning., Disp: , Rfl:    torsemide (DEMADEX) 20 MG tablet, Take 1 tablet (20 mg total) by mouth daily., Disp: 90 tablet, Rfl: 1   acetaminophen (TYLENOL) 500 MG tablet, Take 1 tablet (500 mg total) by mouth every 12 (twelve) hours as needed for moderate pain. (Patient not taking: Reported on 04/05/2023), Disp: 30 tablet, Rfl: 0   aspirin EC 325 MG tablet, Take 1 tablet (325 mg total) by mouth daily. (Patient not taking: Reported on 04/05/2023), Disp: 90 tablet, Rfl: 3   pantoprazole (PROTONIX) 20 MG tablet, Take 1 tablet (20 mg total) by mouth 2 (two) times daily. (Patient not taking:  Reported on 04/05/2023), Disp: 180 tablet, Rfl: 3   pantoprazole (PROTONIX) 40 MG tablet, TAKE 1 TABLET BY MOUTH DAILY (Patient not taking: Reported on 04/05/2023), Disp: 100 tablet, Rfl: 2  Current Facility-Administered Medications:    cyanocobalamin (VITAMIN B12) injection 1,000 mcg, 1,000 mcg, Intramuscular, Q30 days, Jeani Sow, MD, 1,000 mcg at 04/05/23 1345  Allergies  Allergen Reactions   Atenolol Other (See Comments)    Stomach problems, chills   Atorvastatin Other (  See Comments)    Retained fluid   Canagliflozin Other (See Comments)    ALL DIABETIC MEDS PER PT   abdominal pain, headache   Losartan Potassium Other (See Comments)    chills, HA, stomach pain   Acarbose Nausea Only and Other (See Comments)     headache,nausea   Cinnamon Other (See Comments)    Stomach pain   Empagliflozin Other (See Comments)     headaches   Glimepiride Other (See Comments)    ALL DIABETIC MEDS PER PT  abdominal pain, headache   Metformin And Related Nausea And Vomiting and Other (See Comments)    Patient states that she has severe chills, headache and cramping additionally. Says blood sugar is uncontrolled because of this   Other Other (See Comments)    ALL DIABETIC MEDS PER PT    Pioglitazone Other (See Comments)    ALL DIABETIC MEDS PER PT = peripheral edema   Pravastatin Other (See Comments)    Retained fluid   Sitagliptin Other (See Comments)    ALL DIABETIC MEDS PER PT  Other reaction(s): headache   Spironolactone-Hctz Other (See Comments)   Statins Other (See Comments)    Myalgia   Sulfamethoxazole Other (See Comments)    ALL DIABETIC MEDS PER PT    Tramadol Hcl Itching   Atorvastatin Calcium Other (See Comments)     retain fld   Augmentin [Amoxicillin-Pot Clavulanate] Other (See Comments)    Caused jaundice   Crestor [Rosuvastatin] Other (See Comments)   Bumex [Bumetanide] Rash and Other (See Comments)    Chills, rash, headaches, stomach issues also   Lasix  [Furosemide] Rash and Other (See Comments)    Sugars increased   Sulfa Antibiotics Rash and Other (See Comments)    Mother has told patient in the past not to take, she cannot recall there reaction   ROS neg/noncontributory except as noted HPI/below Vision getting worse as far as letters/numbers.  Injections in eyes 2 months ago.      Objective:     BP (!) 142/86   Pulse 66   Temp 98.1 F (36.7 C)   Ht 5\' 2"  (1.575 m)   Wt 187 lb 9.6 oz (85.1 kg)   SpO2 94%   BMI 34.31 kg/m  Wt Readings from Last 3 Encounters:  04/05/23 187 lb 9.6 oz (85.1 kg)  03/28/23 176 lb 5.9 oz (80 kg)  03/14/23 188 lb 6 oz (85.4 kg)    Physical Exam   Gen: WDWN NAD HEENT: NCAT, conjunctiva not injected, sclera still some icterus NECK:  supple, no thyromegaly, no nodes, no carotid bruits CARDIAC: RRR, S1S2+, no murmur. DP 2+B LUNGS: CTAB. No wheezes ABDOMEN:  BS+, soft, sl tender abd, No HSM, no masses EXT:  no edema MSK: no gross abnormalities.  NEURO: A&O x3.  CN II-XII intact.  PSYCH: normal mood. Good eye contact  Addendum 04/06/2023 at 3 PM: Discussed labs with the patient.  LFTs have decreased and bilirubin, except alk phos increased a few points.  Renal function improved some, however still greater than 2.  Sugars were 299.  They have been more elevated lately.  She has multiple allergies.  Not thrilled with the idea of injectable long-acting insulin.  Will give a trial of glipizide 2.5 mg daily.  Monitor sugars at least every 2 to 3 days.  Will repeat CMP in 2 weeks.  Limit torsemide to the minimal amount.  Make sure you are drinking enough water.  She voiced understanding  and repeated instructions     Assessment & Plan:  Jaundice -     Comprehensive metabolic panel -     CBC with Differential/Platelet -     Magnesium  Kidney disease  Local edema  B12 deficiency -     Cyanocobalamin  1.  Jaundice-new diagnosis.  Status post hospitalization.  Aware that it may take a long time to  totally clear.  Feeling a little better.  Will recheck CMP, CBC, magnesium.  Follow-up in 1 month or sooner if needed 2.  Edema-chronic.  Patient varies amount of torsemide.  She did have acute on chronic kidney failure.  Will check BMP.  Advised not to get over diuresed. 3.  Vitamin B12 deficiency-patient was given B12 at 1000 mcg IM today  Angelena Sole, MD

## 2023-04-06 MED ORDER — GLIPIZIDE 2.5 MG PO TABS: 2.5000 mg | ORAL_TABLET | Freq: Every day | ORAL | 0 refills | Status: DC

## 2023-04-16 ENCOUNTER — Other Ambulatory Visit (INDEPENDENT_AMBULATORY_CARE_PROVIDER_SITE_OTHER): Payer: 59

## 2023-04-16 ENCOUNTER — Other Ambulatory Visit: Payer: Self-pay | Admitting: Family Medicine

## 2023-04-16 DIAGNOSIS — N289 Disorder of kidney and ureter, unspecified: Secondary | ICD-10-CM | POA: Diagnosis not present

## 2023-04-16 DIAGNOSIS — R17 Unspecified jaundice: Secondary | ICD-10-CM

## 2023-04-17 ENCOUNTER — Inpatient Hospital Stay (HOSPITAL_BASED_OUTPATIENT_CLINIC_OR_DEPARTMENT_OTHER): Payer: 59 | Admitting: Obstetrics & Gynecology

## 2023-04-17 ENCOUNTER — Other Ambulatory Visit: Payer: Self-pay | Admitting: Family Medicine

## 2023-04-17 DIAGNOSIS — C541 Malignant neoplasm of endometrium: Secondary | ICD-10-CM

## 2023-04-17 LAB — COMPREHENSIVE METABOLIC PANEL
ALT: 27 U/L (ref 0–35)
AST: 28 U/L (ref 0–37)
Albumin: 3.8 g/dL (ref 3.5–5.2)
Alkaline Phosphatase: 144 U/L — ABNORMAL HIGH (ref 39–117)
BUN: 38 mg/dL — ABNORMAL HIGH (ref 6–23)
CO2: 24 mEq/L (ref 19–32)
Calcium: 8.9 mg/dL (ref 8.4–10.5)
Chloride: 103 mEq/L (ref 96–112)
Creatinine, Ser: 1.64 mg/dL — ABNORMAL HIGH (ref 0.40–1.20)
GFR: 30.87 mL/min — ABNORMAL LOW (ref >60.00)
Glucose, Bld: 191 mg/dL — ABNORMAL HIGH (ref 70–99)
Potassium: 4.6 mEq/L (ref 3.5–5.1)
Sodium: 136 mEq/L (ref 135–145)
Total Bilirubin: 1.9 mg/dL — ABNORMAL HIGH (ref 0.2–1.2)
Total Protein: 6.5 g/dL (ref 6.0–8.3)

## 2023-04-17 LAB — LACTATE DEHYDROGENASE: LDH: 182 U/L (ref 120–250)

## 2023-04-17 NOTE — Assessment & Plan Note (Signed)
[name]  is a 71 y.o.  year old P3 with stage IA grade 1 endometrial cancer, MMR deficient, (hypermethylation present), s/p surgical staging on 10/27/20 .  No evidence of recurrence   Advanced stage anterior wall defect, CUR sxs.  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 >encouraged f/u w/UROGYN >optimize hygiene practices; counseled re: moisturizers--education materials provided 

## 2023-04-17 NOTE — Progress Notes (Signed)
Follow Up Note: Gyn-Onc  [name] 72 y.o. female  CC: She presents for a follow-up visit   HPI: The oncology history was reviewed.  Interval History: She was seen in consultation by urogynecology for her symptomatic POP.  At the last visit C/O pain, diarrhea, urinary frequency/urgency.       Review of Systems  Review of Systems  Constitutional:  Negative for malaise/fatigue and weight loss.  Respiratory:  Negative for shortness of breath and wheezing.   Cardiovascular:  Negative for chest pain and leg swelling.  Gastrointestinal:  Negative for abdominal pain, blood in stool, constipation, nausea and vomiting.  Genitourinary:  Negative for dysuria, frequency, hematuria and urgency.  Musculoskeletal:  Negative for joint pain and myalgias.  Neurological:  Negative for weakness.  Psychiatric/Behavioral:  Negative for depression. The patient does not have insomnia.    Current medications, allergy, social history, past surgical history, past medical history, family history were all reviewed.    Vitals:  There were no vitals taken for this visit.  Physical Exam:  Physical Exam Exam conducted with a chaperone present.  Constitutional:      General: She is not in acute distress. Cardiovascular:     Rate and Rhythm: Normal rate and regular rhythm.  Pulmonary:     Effort: Pulmonary effort is normal.     Breath sounds: Normal breath sounds. No wheezing or rhonchi.  Abdominal:     Palpations: Abdomen is soft.     Tenderness: There is no abdominal tenderness. There is no right CVA tenderness or left CVA tenderness.     Hernia: No hernia is present.  Genitourinary:    General: Normal vulva.     Urethra: No urethral lesion.     Vagina: No lesions. No bleeding.  Large cystocele Musculoskeletal:     Cervical back: Neck supple.     Right lower leg: No edema.     Left lower leg: No edema.  Lymphadenopathy:     Upper Body:     Right upper body: No supraclavicular adenopathy.      Left upper body: No supraclavicular adenopathy.     Lower Body: No right inguinal adenopathy. No left inguinal adenopathy.  Skin:    Findings: No rash.  Neurological:     Mental Status: She is oriented to person, place, and time.   Assessment/Plan:  No problem-specific Assessment & Plan notes found for this encounter.    I personally spent 25 minutes face-to-face and non-face-to-face in the care of this patient, which includes all pre, intra, and post visit time on the date of service.   Antionette Char, MD

## 2023-04-17 NOTE — Transitions of Care (Post Inpatient/ED Visit) (Signed)
   04/17/2023  Name: [name] MRN: [id number] DOB: 1951/11/25  Today's TOC FU Call Status: Today's TOC FU Call Status:: Unsuccessul Call (1st Attempt) Unsuccessful Call (1st Attempt) Date: 04/01/23  Attempted to reach the patient regarding the most recent Inpatient/ED visit.  Follow Up Plan: No further outreach attempts will be made at this time. We have been unable to contact the patient. Patient was seen by PCP on 04/05/23.  Signature  Agnes Lawrence, CMA (AAMA)  [facility]- AWV Program [phone]

## 2023-04-28 NOTE — Progress Notes (Signed)
04/28/2023 [name] [id number] 12-11-1951  Referring provider: Jeani Sow, MD Primary GI doctor: Dr. Lavon Paganini  ASSESSMENT AND PLAN:  Elevated LFTs with history of fatty liver Patient had acute elevation of liver function with cholestatic pattern. MRCP without evidence of choledocholithiasis, did show CBD dilated up to 12 mm but not changed from 2022 and tapering to the level of the ampulla.  No filling defect or focal mass.  Mild splenomegaly. Most recent liver function showed near resolution of her LFTs Most likely this represents DILI from Augmentin use Will recheck liver function today  Change in bowel habits with alternating diarrhea and constipation Recent diverticulitis now resolved Recall colonoscopy 05/2023 Most likely some of this is anxiety, IBS, possible pelvic floor component, however with change in bowel habits, recent diverticulitis, and patient is due for colonoscopy, will plan on repeat colonoscopy in August to evaluate for microscopic colitis, polyps, malignancy, etc. Patient is dealing with macular degeneration at this time and would like more time to get some injections in her eyes prior to scheduling for colonoscopy, scheduled for in August, appropriate for [facility] with Dr. Lavon Paganini. Patient is very fearful of trying medications for IBS, will do MiraLAX with Senokot every 2 to 3 days and Benefiber.  History of Present Illness:  72 y.o. female  with a past medical history of type 2 diabetes, GERD, anxiety, hypertension, hyperlipidemia hypothyroidism, obesity history of subarachnoid hemorrhage 2010, endometrial cancer 2021, anemia, IBS mixed constipation and diarrhea and others listed below, returns to clinic today for evaluation of IBS.  Status post hysterectomy, BSO and lymph node sampling 2021 stage I endometrial cancer. 05/2018 colonoscopy 2 small sessile polyps removed 1 tubular adenoma, diverticulosis, internal hemorrhoid, recall 5 years.  (05/2023) 03/06/2021 Labs negative GI pathogen, O&P, fecal elastase 11/2021 CT ab and pelvis LLQ pain- tiny recal calculi, no metatic carcinoma, diverticulosi,  02/08/2022 hemoglobin 10.5, 4.0, MCV 89.6, platelets 268 05/30/2022 seen patient for worsening diarrhea, off amtitripyline and started on gabapentin, Cdiff negative, thyroid normal. Positive yersinia enterocolitica, given Cipro 500 mg twice daily for 5 days Felt better while on ABX 06/22/2022 office visit with myself for diarrhea then constipation potential pelvic floor component  Iron 41, ferritin 45, saturation ratio 10.4, hemoglobin 12 CRP unremarkable   08/09/2022 office visit with Dr. Lavon Paganini for IBS overall doing better with antibiotics and dietary change as BM every 1 to 3 days denies rectal bleeding.    Continued on dicyclomine, lactose-free diet.  01/16/2023 office visit with myself for constipation for 2 weeks She had normal iron, ferritin, B12 normal range.   Sed rate was unremarkable, no leukocytosis.  Kidney and liver normal 02/26/2023 office visit with PCP for 3 to 5 days of abdominal pain and diarrhea. 02/28/2022 CT abdomen pelvis shows diverticulitis without any complications.   Started on Augmentin for 10 days.  Had 5 days extension of Augmentin. Labs 3/28 showed normal LFTs normal CBC.  Developed pruritic jaundice secondary to DILI and hospitilized.   AST 154, ALT 149, total bilirubin 8.1.  CT abdomen/pelvis unrevealing. 04/16/2023 AST 28, ALT 27, alk phos 144, total bilirubin 1.9  She has BM every 4-5 days and has small volume streaks of small stool between Bm's.  She is still having the AB cramping but it has improved from where she was.  She states she is not taking her probiotic and feels she needs to.  She is not on trulance, she is on senokot 2 a day, occ once a day.  Dicyclomine takes occ and it helps.   She is driving still but states her mac degen is getting worse, she does not do my chart. She need to see her  eye doctor and this is helping.  Walks with cane, able to get on exam table with help.  Her daughter with mental health issues is living with her.   03/12/2023 Echocardiogram bubble test negative, normal LVF, aortic valve not assessed.  She is not on blood thinner.   Wt Readings from Last 10 Encounters:  04/05/23 187 lb 9.6 oz (85.1 kg)  03/28/23 176 lb 5.9 oz (80 kg)  03/14/23 188 lb 6 oz (85.4 kg)  03/05/23 184 lb (83.5 kg)  02/26/23 186 lb 2 oz (84.4 kg)  02/12/23 190 lb (86.2 kg)  01/16/23 191 lb (86.6 kg)  01/07/23 193 lb (87.5 kg)  12/19/22 190 lb 8 oz (86.4 kg)  12/07/22 186 lb 2 oz (84.4 kg)     Current Medications:   Current Outpatient Medications (Endocrine & Metabolic):    glipiZIDE 2.5 MG TABS, Take 2.5 mg by mouth daily. With breakfast   levothyroxine (SYNTHROID) 50 MCG tablet, Take 1 tablet (50 mcg total) by mouth daily before breakfast.   Current Outpatient Medications (Cardiovascular):    Evolocumab (REPATHA SURECLICK) 140 MG/ML SOAJ, INJECT 1 PEN SUBCUTANEOUSLY EVERY 14 DAYS (Patient taking differently: Inject 140 mg into the skin every 14 (fourteen) days.)   torsemide (DEMADEX) 20 MG tablet, Take 1 tablet (20 mg total) by mouth daily.       Current Outpatient Medications (Hematological):    cyanocobalamin (,VITAMIN B-12,) 1000 MCG/ML injection, Inject 1,000 mcg into the muscle every 30 (thirty) days.   Current Facility-Administered Medications (Hematological):    cyanocobalamin (VITAMIN B12) injection 1,000 mcg  Current Outpatient Medications (Other):    Cholecalciferol (VITAMIN D3) 25 MCG (1000 UT) capsule, Take 1,000 Units by mouth daily.   dicyclomine (BENTYL) 20 MG tablet, Take 1 tablet (20 mg total) by mouth every 8 (eight) hours as needed for spasms (AB pain).   glucose blood (ACCU-CHEK GUIDE) test strip, Use to test blood sugar 3 times daily   MAGNESIUM CITRATE PO*, Take 1 tablet by mouth 2 (two) times daily.   Multiple Vitamin (MULTIVITAMIN  WITH MINERALS) TABS tablet, Take 1 tablet by mouth daily.   potassium chloride SA (KLOR-CON M) 20 MEQ tablet, Take 1 tablet (20 mEq total) by mouth 2 (two) times daily.   pregabalin (LYRICA) 50 MG capsule, Take 50 mg by mouth daily as needed (for pain).   SYSTANE HYDRATION PF 0.4-0.3 % SOLN, Place 1-2 drops into both eyes in the morning.  * These medications belong to multiple therapeutic classes and are listed under each applicable group.  Surgical History:  She  has a past surgical history that includes Dilation and curettage of uterus (1972); Cholecystectomy (1983); Hysteroscopy with D & C (06/29/2011); Colonoscopy (2008); Robotic assisted total hysterectomy with bilateral salpingo oophorectomy (N/A, 10/27/2020); Sentinel node biopsy (N/A, 10/27/2020); and Cataract extraction (Right). Family History:  Her family history includes Anesthesia problems in her granddaughter, mother, and son; Arthritis in an other family member; Bladder Cancer in her father; Cerebrovascular Disease in her mother; Colon cancer in her father; Diabetes in an other family member; High blood pressure in an other family member; Liver cancer in her father; Schizophrenia in her child and daughter; Tremor in her child and son. Social History:   reports that she has never smoked. She has never used smokeless tobacco. She  reports that she does not currently use alcohol. She reports that she does not use drugs.  Current Medications, Allergies, Past Medical History, Past Surgical History, Family History and Social History were reviewed in [employer] record.  Physical Exam: There were no vitals taken for this visit. General:   Pleasant, well developed female in no acute distress Heart : Regular rate and rhythm, occ PVC,  holosystolic murmur harsh Pulm: Clear anteriorly; no wheezing Abdomen:  Soft, Obese AB, Active bowel sounds. No tenderness . Without guarding and Without rebound, No organomegaly  appreciated. Rectal: declines Extremities:  without  edema. Neurologic:  Alert and  oriented x4;  No focal deficits. Walking with cane, walks slowly, tremor left hand.  Psych:  Cooperative. Normal mood and affect.   Doree Albee, PA-C 04/28/23

## 2023-04-30 ENCOUNTER — Other Ambulatory Visit: Payer: Self-pay | Admitting: Family Medicine

## 2023-04-30 ENCOUNTER — Encounter: Payer: 59 | Admitting: Physical Medicine and Rehabilitation

## 2023-05-06 ENCOUNTER — Other Ambulatory Visit (INDEPENDENT_AMBULATORY_CARE_PROVIDER_SITE_OTHER): Payer: 59

## 2023-05-06 ENCOUNTER — Encounter: Payer: Self-pay | Admitting: Physician Assistant

## 2023-05-06 ENCOUNTER — Other Ambulatory Visit: Payer: Self-pay

## 2023-05-06 ENCOUNTER — Ambulatory Visit (INDEPENDENT_AMBULATORY_CARE_PROVIDER_SITE_OTHER): Payer: 59 | Admitting: Physician Assistant

## 2023-05-06 ENCOUNTER — Telehealth: Payer: Self-pay | Admitting: Physician Assistant

## 2023-05-06 VITALS — BP 130/82 | HR 80 | Ht 62.0 in | Wt 190.0 lb

## 2023-05-06 DIAGNOSIS — R7989 Other specified abnormal findings of blood chemistry: Secondary | ICD-10-CM

## 2023-05-06 DIAGNOSIS — Z86010 Personal history of colonic polyps: Secondary | ICD-10-CM

## 2023-05-06 DIAGNOSIS — D509 Iron deficiency anemia, unspecified: Secondary | ICD-10-CM | POA: Diagnosis not present

## 2023-05-06 DIAGNOSIS — K760 Fatty (change of) liver, not elsewhere classified: Secondary | ICD-10-CM

## 2023-05-06 DIAGNOSIS — K581 Irritable bowel syndrome with constipation: Secondary | ICD-10-CM | POA: Diagnosis not present

## 2023-05-06 LAB — BASIC METABOLIC PANEL
BUN: 26 mg/dL — ABNORMAL HIGH (ref 6–23)
CO2: 28 mEq/L (ref 19–32)
Calcium: 9.7 mg/dL (ref 8.4–10.5)
Chloride: 102 mEq/L (ref 96–112)
Creatinine, Ser: 1.34 mg/dL — ABNORMAL HIGH (ref 0.40–1.20)
GFR: 39.32 mL/min — ABNORMAL LOW (ref >60.00)
Glucose, Bld: 162 mg/dL — ABNORMAL HIGH (ref 70–99)
Potassium: 4.3 mEq/L (ref 3.5–5.1)
Sodium: 139 mEq/L (ref 135–145)

## 2023-05-06 LAB — CBC WITH DIFFERENTIAL/PLATELET
Basophils Absolute: 0 10*3/uL (ref 0.0–0.1)
Basophils Relative: 0.5 % (ref 0.0–3.0)
Eosinophils Absolute: 0.1 10*3/uL (ref 0.0–0.7)
Eosinophils Relative: 3.2 % (ref 0.0–5.0)
HCT: 30.5 % — ABNORMAL LOW (ref 36.0–46.0)
Hemoglobin: 10.6 g/dL — ABNORMAL LOW (ref 12.0–15.0)
Lymphocytes Relative: 24.3 % (ref 12.0–46.0)
Lymphs Abs: 1.1 10*3/uL (ref 0.7–4.0)
MCHC: 34.6 g/dL (ref 30.0–36.0)
MCV: 92.7 fl (ref 78.0–100.0)
Monocytes Absolute: 0.4 10*3/uL (ref 0.1–1.0)
Monocytes Relative: 8.5 % (ref 3.0–12.0)
Neutro Abs: 2.8 10*3/uL (ref 1.4–7.7)
Neutrophils Relative %: 63.5 % (ref 43.0–77.0)
Platelets: 196 10*3/uL (ref 150.0–400.0)
RBC: 3.3 Mil/uL — ABNORMAL LOW (ref 3.87–5.11)
RDW: 15.4 % (ref 11.5–15.5)
WBC: 4.4 10*3/uL (ref 4.0–10.5)

## 2023-05-06 LAB — IRON: Iron: 47 ug/dL (ref 42–145)

## 2023-05-06 LAB — HEPATIC FUNCTION PANEL
ALT: 14 U/L (ref 0–35)
AST: 21 U/L (ref 0–37)
Albumin: 3.9 g/dL (ref 3.5–5.2)
Alkaline Phosphatase: 84 U/L (ref 39–117)
Bilirubin, Direct: 0.4 mg/dL — ABNORMAL HIGH (ref 0.0–0.3)
Total Bilirubin: 1 mg/dL (ref 0.2–1.2)
Total Protein: 7 g/dL (ref 6.0–8.3)

## 2023-05-06 MED ORDER — NA SULFATE-K SULFATE-MG SULF 17.5-3.13-1.6 GM/177ML PO SOLN: 1.0000 | Freq: Once | ORAL | 0 refills | Status: AC

## 2023-05-06 MED ORDER — VSL#3 DS PO PACK: 1.0000 | PACK | Freq: Every day | ORAL | 0 refills | Status: DC

## 2023-05-06 NOTE — Addendum Note (Signed)
Addended by: Sunday Spillers on: 05/06/2023 04:23 PM   Modules accepted: Orders

## 2023-05-06 NOTE — Telephone Encounter (Signed)
Patient call stating she was recommended two different probiotics. States they are both very expensive, and would a prescription sent in to [facility] so her insurance can cover one probiotics. Please advise, thank you.

## 2023-05-06 NOTE — Patient Instructions (Addendum)
Your provider has requested that you go to the basement level for lab work before leaving today. Press "B" on the elevator. The lab is located at the first door on the left as you exit the elevator.   You have been scheduled for a colonoscopy. Please follow written instructions given to you at your visit today.  Please pick up your prep supplies at the pharmacy within the next 1-3 days. If you use inhalers (even only as needed), please bring them with you on the day of your procedure.    Miralax is an osmotic laxative.  It only brings more water into the stool.  This is safe to take daily.  Take 17 gram of miralax twice a day with a senokot 2 pills every 2 days.  Mix with juice or coffee.  Reassess your response in 3-4 days.  You can increase and decrease the dose based on your response.  Remember, it can take up to 3-4 days to take effect OR for the effects to wear off.   I often pair this with benefiber in the morning to help assure the stool is not too loose.   Probiotics are microorganisms which may be beneficial to the function of the gastrointestinal tract and are used in several different GI conditions including Irritable Bowel Syndrome and following infections involving the GI tract.  Two commonly used probiotics are:  VSL#3 (Bifidobacterium breve, B. longum, B. infantis, Lactobacillus acidophilus, L. plantarum, L. paracasei, L. bulgaricus, Streptococcus thermophilus) and Align (B. Infantis).  I recommend you take one of these two probiotics once daily.  _______________________________________________________  If your blood pressure at your visit was 140/90 or greater, please contact your primary care physician to follow up on this.  _______________________________________________________  If you are age 75 or older, your body mass index should be between 23-30. Your Body mass index is 34.75 kg/m. If this is out of the aforementioned range listed, please consider follow up  with your Primary Care Provider.  If you are age 62 or younger, your body mass index should be between 19-25. Your Body mass index is 34.75 kg/m. If this is out of the aformentioned range listed, please consider follow up with your Primary Care Provider.   ________________________________________________________  The [facility] providers would like to encourage you to use [facility] to communicate with providers for non-urgent requests or questions.  Due to long hold times on the telephone, sending your provider a message by [facility] may be a faster and more efficient way to get a response.  Please allow 48 business hours for a response.  Please remember that this is for non-urgent requests.  _______________________________________________________ It was a pleasure to see you today!  Thank you for trusting me with your gastrointestinal care!

## 2023-05-07 ENCOUNTER — Ambulatory Visit (INDEPENDENT_AMBULATORY_CARE_PROVIDER_SITE_OTHER): Payer: 59 | Admitting: Family Medicine

## 2023-05-07 ENCOUNTER — Encounter: Payer: Self-pay | Admitting: Family Medicine

## 2023-05-07 VITALS — BP 130/80 | HR 84 | Temp 98.2°F | Resp 18 | Ht 62.0 in | Wt 189.2 lb

## 2023-05-07 DIAGNOSIS — E1122 Type 2 diabetes mellitus with diabetic chronic kidney disease: Secondary | ICD-10-CM

## 2023-05-07 DIAGNOSIS — D649 Anemia, unspecified: Secondary | ICD-10-CM

## 2023-05-07 DIAGNOSIS — Z7984 Long term (current) use of oral hypoglycemic drugs: Secondary | ICD-10-CM

## 2023-05-07 DIAGNOSIS — R600 Localized edema: Secondary | ICD-10-CM | POA: Diagnosis not present

## 2023-05-07 DIAGNOSIS — N1832 Chronic kidney disease, stage 3b: Secondary | ICD-10-CM | POA: Diagnosis not present

## 2023-05-07 DIAGNOSIS — R17 Unspecified jaundice: Secondary | ICD-10-CM

## 2023-05-07 DIAGNOSIS — E538 Deficiency of other specified B group vitamins: Secondary | ICD-10-CM

## 2023-05-07 DIAGNOSIS — R1084 Generalized abdominal pain: Secondary | ICD-10-CM

## 2023-05-07 DIAGNOSIS — H353122 Nonexudative age-related macular degeneration, left eye, intermediate dry stage: Secondary | ICD-10-CM | POA: Diagnosis not present

## 2023-05-07 LAB — FERRITIN: Ferritin: 171.9 ng/mL (ref 10.0–291.0)

## 2023-05-07 MED ORDER — CYANOCOBALAMIN 1000 MCG/ML IJ SOLN
1000.0000 ug | Freq: Once | INTRAMUSCULAR | Status: AC
Start: 2023-05-07 — End: 2023-05-07
  Administered 2023-05-07: 1000 ug via INTRAMUSCULAR

## 2023-05-07 NOTE — Assessment & Plan Note (Signed)
Chronic.  Seeing retina doctor tomorrow.  She is not comfortable with him tomorrow advised to keep appointment for now, and will refer to another retina specialist.

## 2023-05-07 NOTE — Assessment & Plan Note (Signed)
Chronic.  Well-controlled on torsemide 20 mg daily.  Patient states that she cannot do without the medication or else everything swells back up.

## 2023-05-07 NOTE — Progress Notes (Signed)
Subjective:     Patient ID: [name], female    DOB: 1951-11-26, 72 y.o.   MRN: [id number]  Chief Complaint  Patient presents with   Medical Management of Chronic Issues    1 month follow-up     HPI  Abdominal pain-has had scans.  Has seen gastroenterology. Getting colonoscopy in August. Told to take probiotics.  CHRONIC KIDNEY DISEASE-creatinine had increased to 2.  Past 2 labs, improved to 1.64 and 1.34. saw [facility] but no follow up needed per patient.  DM-intolerant mult medications.  Sugars have been over 200.  Patient not taking the glipizide 2.5 mg. A1C 7.2 03/05/23.  Not checking sugars.  Elevated LFT's-thought to be due to Augmentin-back to normal now.  Edema-taking torsemide 20 mg daily.  Doing ok Eyes getting "really bad"-seeing retina tomorrow. Patient concerned about the macular degeneration and getting injections. Dr. Champ Mungo Anemia-recurrent.    Health Maintenance Due  Topic Date Due   Medicare Annual Wellness (AWV)  Never done   OPHTHALMOLOGY EXAM  Never done    Past Medical History:  Diagnosis Date   Allergy    Anemia    Arthritis    Asthma    Dianosed years ago-no meds at this time   Cancer [facility]) 2021   endometrial    Cataract    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 name of MD    Dementia (HCC)    Diabetes mellitus    type 2    Diverticulosis    Dyslipidemia    Dyspnea    with exertion    Dysrhythmia    "skips beats"   Endometrial cancer (HCC) 2021   Family history of adverse reaction to anesthesia    son stopped breathing during surgery , mother slow to wake up    Fibromyalgia 1980's   Generalized anxiety disorder    GERD (gastroesophageal reflux disease)    occ- will use Tums and Prolosec  prn    Heart murmur    Hyperlipemia    Hypertension    not on blood pressure meds due to allergies -  last dose of meds- months ago    Hypothyroidism    Hypothyroidism (acquired)    Internal hemorrhoids    Irritable bowel syndrome 1980's   Liver cancer (HCC)    Major depressive disorder    Migraine headaches 1980's   On meds-well controlled   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   Morbid obesity (HCC)    Pernicious anemia    Pneumonia 2009   Several times over the past several years   PONV (postoperative nausea and vomiting)    was told by neurologist due to calcificaton in brain not to be put to sleep, N/V during Op and Recovery    Recurrent upper respiratory infection (URI)    Sleep apnea    no cpap   Subarachnoid hemorrhage (HCC) 2010   Vaginal polyp    benign per pt    Past Surgical History:  Procedure Laterality Date   CATARACT EXTRACTION Right    CHOLECYSTECTOMY  1983   COLONOSCOPY  2008   DB   DILATION AND CURETTAGE OF UTERUS  1972   HYSTEROSCOPY WITH D & C  06/29/2011   Procedure: DILATATION AND CURETTAGE (D&C) /HYSTEROSCOPY;  Surgeon: Reva Bores, MD;  Location: [facility];  Service:  Gynecology;  Laterality: N/A;   ROBOTIC ASSISTED TOTAL HYSTERECTOMY WITH BILATERAL SALPINGO OOPHERECTOMY N/A 10/27/2020   Procedure: XI ROBOTIC ASSISTED TOTAL HYSTERECTOMY WITH BILATERAL SALPINGO OOPHORECTOMY;  Surgeon: Adolphus Birchwood, MD;  Location: [facility];  Service: Gynecology;  Laterality: N/A;   SENTINEL NODE BIOPSY N/A 10/27/2020   Procedure: SENTINEL NODE BIOPSY;  Surgeon: Adolphus Birchwood, MD;  Location: [facility];  Service: Gynecology;  Laterality: N/A;     Current Outpatient Medications:    Cholecalciferol (VITAMIN D3) 25 MCG (1000 UT) capsule, Take 1,000 Units by mouth daily., Disp: , Rfl:    cyanocobalamin (,VITAMIN B-12,) 1000 MCG/ML injection, Inject 1,000 mcg into the muscle every 30 (thirty) days. , Disp: , Rfl:    dicyclomine (BENTYL) 20 MG tablet, Take 1 tablet (20 mg total) by mouth every 8 (eight) hours as needed for spasms (AB pain)., Disp: 30 tablet, Rfl: 0   Evolocumab  (REPATHA SURECLICK) 140 MG/ML SOAJ, INJECT 1 PEN SUBCUTANEOUSLY EVERY 14 DAYS (Patient taking differently: Inject 140 mg into the skin every 14 (fourteen) days.), Disp: 6 mL, Rfl: 3   glipiZIDE (GLUCOTROL) 5 MG tablet, Take 2.5 mg by mouth every morning., Disp: , Rfl:    glipiZIDE (GLUCOTROL) 5 MG tablet, Take 0.5 tablets (2.5 mg total) by mouth daily before breakfast., Disp: 45 tablet, Rfl: 1   glucose blood (ACCU-CHEK GUIDE) test strip, Use to test blood sugar 3 times daily, Disp: 100 each, Rfl: 2   levothyroxine (SYNTHROID) 50 MCG tablet, Take 1 tablet (50 mcg total) by mouth daily before breakfast., Disp: 90 tablet, Rfl: 3   MAGNESIUM CITRATE PO, Take 1 tablet by mouth 2 (two) times daily., Disp: , Rfl:    Multiple Vitamin (MULTIVITAMIN WITH MINERALS) TABS tablet, Take 1 tablet by mouth daily., Disp: 30 tablet, Rfl: 0   Na Sulfate-K Sulfate-Mg Sulf 17.5-3.13-1.6 GM/177ML SOLN, , Disp: , Rfl:    potassium chloride SA (KLOR-CON M) 20 MEQ tablet, Take 1 tablet (20 mEq total) by mouth 2 (two) times daily., Disp: 180 tablet, Rfl: 1   pregabalin (LYRICA) 50 MG capsule, Take 50 mg by mouth daily as needed (for pain)., Disp: , Rfl:    Probiotic Product (VSL#3 DS) PACK, Take 1 each by mouth daily., Disp: 20 each, Rfl: 0   SYSTANE HYDRATION PF 0.4-0.3 % SOLN, Place 1-2 drops into both eyes in the morning., Disp: , Rfl:    torsemide (DEMADEX) 20 MG tablet, Take 1 tablet (20 mg total) by mouth daily., Disp: 90 tablet, Rfl: 1   TRULANCE 3 MG TABS, Take 1 tablet by mouth daily., Disp: , Rfl:   Current Facility-Administered Medications:    cyanocobalamin (VITAMIN B12) injection 1,000 mcg, 1,000 mcg, Intramuscular, Q30 days, Jeani Sow, MD, 1,000 mcg at 04/05/23 1345  Allergies  Allergen Reactions   Atenolol Other (See Comments)    Stomach problems, chills   Atorvastatin Other (See Comments)    Retained fluid   Canagliflozin Other (See Comments)    ALL DIABETIC MEDS PER PT   abdominal pain,  headache   Losartan Potassium Other (See Comments)    chills, HA, stomach pain   Acarbose Nausea Only and Other (See Comments)     headache,nausea   Cinnamon Other (See Comments)    Stomach pain   Empagliflozin Other (See Comments)     headaches   Glimepiride Other (See Comments)    ALL DIABETIC MEDS PER PT  abdominal pain, headache   Metformin And Related Nausea And Vomiting and Other (  See Comments)    Patient states that she has severe chills, headache and cramping additionally. Says blood sugar is uncontrolled because of this   Other Other (See Comments)    ALL DIABETIC MEDS PER PT    Pioglitazone Other (See Comments)    ALL DIABETIC MEDS PER PT = peripheral edema   Pravastatin Other (See Comments)    Retained fluid   Sitagliptin Other (See Comments)    ALL DIABETIC MEDS PER PT  Other reaction(s): headache   Spironolactone-Hctz Other (See Comments)   Statins Other (See Comments)    Myalgia   Sulfamethoxazole Other (See Comments)    ALL DIABETIC MEDS PER PT    Tramadol Hcl Itching   Atorvastatin Calcium Other (See Comments)     retain fld   Augmentin [Amoxicillin-Pot Clavulanate] Other (See Comments)    Caused jaundice   Crestor [Rosuvastatin] Other (See Comments)   Rosuvastatin Calcium    Bumex [Bumetanide] Rash and Other (See Comments)    Chills, rash, headaches, stomach issues also   Lasix [Furosemide] Rash and Other (See Comments)    Sugars increased   Sulfa Antibiotics Rash and Other (See Comments)    Mother has told patient in the past not to take, she cannot recall there reaction   ROS neg/noncontributory except as noted HPI/below Gets "twinges" in chest at rest.  Lasts few seconds.        Objective:     BP 130/80   Pulse 84   Temp 98.2 F (36.8 C) (Temporal)   Resp 18   Ht 5\' 2"  (1.575 m)   Wt 189 lb 4 oz (85.8 kg)   SpO2 98%   BMI 34.61 kg/m  Wt Readings from Last 3 Encounters:  05/07/23 189 lb 4 oz (85.8 kg)  05/06/23 190 lb (86.2 kg)   04/05/23 187 lb 9.6 oz (85.1 kg)    Physical Exam   Gen: WDWN NAD HEENT: NCAT, conjunctiva not injected, sclera nonicteric CARDIAC: RRR, S1S2+, no murmur. DP 2+B LUNGS: CTAB. No wheezes ABDOMEN:  BS+, soft, NTND, No HSM, no masses EXT:  no edema MSK: no gross abnormalities.  NEURO: A&O x3.  CN II-XII intact.  PSYCH: normal mood. Good eye contact     Assessment & Plan:  Generalized abdominal pain  Jaundice Assessment & Plan: Thought to be due to Augmentin.  She was hospitalized.  Everything seems to have resolved.   Type 2 diabetes mellitus with stage 3b chronic kidney disease, without long-term current use of insulin (HCC) Assessment & Plan: Chronic.  Not well-controlled.  Very intolerant of multiple meds so not on any medications.  Not doing well with diet.  Limited exercise.  Things exacerbated when she had acute transaminitis/jaundice.  Things seem to be improving.   Local edema Assessment & Plan: Chronic.  Well-controlled on torsemide 20 mg daily.  Patient states that she cannot do without the medication or else everything swells back up.   Anemia, unspecified type  B12 deficiency Assessment & Plan: Chronic.  Patient gets monthly B12 injections.  Continue  Orders: -     Cyanocobalamin  Intermediate stage nonexudative age-related macular degeneration of left eye Assessment & Plan: Chronic.  Seeing retina doctor tomorrow.  She is not comfortable with him tomorrow advised to keep appointment for now, and will refer to another retina specialist.   Abdominal pain-improved.  Intermitt.  Seeing gastroenterology-will start probiotics Anemia-?from exacerbation of jaundice, etc.  Will monitor  Angelena Sole, MD

## 2023-05-07 NOTE — Telephone Encounter (Signed)
PA request received via CMM for VSL #3, PA will not be submitted due to medication being OTC and not covered by plan

## 2023-05-07 NOTE — Assessment & Plan Note (Signed)
Thought to be due to Augmentin.  She was hospitalized.  Everything seems to have resolved.

## 2023-05-07 NOTE — Patient Instructions (Signed)
Call cardiology for appointment   See eye doctor  Trying to refer to retina

## 2023-05-07 NOTE — Assessment & Plan Note (Signed)
Chronic.  Not well-controlled.  Very intolerant of multiple meds so not on any medications.  Not doing well with diet.  Limited exercise.  Things exacerbated when she had acute transaminitis/jaundice.  Things seem to be improving.

## 2023-05-07 NOTE — Telephone Encounter (Signed)
Called patient to advise. Left vm.

## 2023-05-07 NOTE — Assessment & Plan Note (Signed)
Chronic.  Patient gets monthly B12 injections.  Continue

## 2023-05-08 DIAGNOSIS — H2512 Age-related nuclear cataract, left eye: Secondary | ICD-10-CM | POA: Diagnosis not present

## 2023-05-08 DIAGNOSIS — H43813 Vitreous degeneration, bilateral: Secondary | ICD-10-CM | POA: Diagnosis not present

## 2023-05-08 DIAGNOSIS — H353124 Nonexudative age-related macular degeneration, left eye, advanced atrophic with subfoveal involvement: Secondary | ICD-10-CM | POA: Diagnosis not present

## 2023-05-08 DIAGNOSIS — D3132 Benign neoplasm of left choroid: Secondary | ICD-10-CM | POA: Diagnosis not present

## 2023-05-08 DIAGNOSIS — H353113 Nonexudative age-related macular degeneration, right eye, advanced atrophic without subfoveal involvement: Secondary | ICD-10-CM | POA: Diagnosis not present

## 2023-05-08 DIAGNOSIS — H04123 Dry eye syndrome of bilateral lacrimal glands: Secondary | ICD-10-CM | POA: Diagnosis not present

## 2023-05-08 NOTE — Telephone Encounter (Signed)
Patient is calling back, she want to know if something else can be prescribed instead.Please advise

## 2023-05-15 ENCOUNTER — Telehealth: Payer: Self-pay | Admitting: Family Medicine

## 2023-05-15 NOTE — Telephone Encounter (Signed)
Pt would like a call back about the conversation with her liver issues. A second opinion referral. Please advise.

## 2023-05-15 NOTE — Telephone Encounter (Signed)
Patient stated that she wanted to discuss last eye appointment with pcp. She was told that she needed shots in both eyes and she wasn't sure if she should do them. She also stated that last ov it was discussed that maybe she should get a second opinion from another eye specialist and she wanted to know if she should do that. Patient stated that she will discuss her concerns with pcp at upcoming appointment on 06/04/23.

## 2023-05-16 NOTE — Telephone Encounter (Signed)
Patient notified of message below.

## 2023-05-21 NOTE — Telephone Encounter (Signed)
Patient is returning your call.  

## 2023-05-22 ENCOUNTER — Ambulatory Visit (INDEPENDENT_AMBULATORY_CARE_PROVIDER_SITE_OTHER): Payer: 59

## 2023-05-22 ENCOUNTER — Encounter (HOSPITAL_COMMUNITY): Payer: Self-pay

## 2023-05-22 ENCOUNTER — Ambulatory Visit (HOSPITAL_COMMUNITY)
Admission: EM | Admit: 2023-05-22 | Discharge: 2023-05-22 | Disposition: A | Discharge status: Home / Self Care | Payer: 59 | Attending: Emergency Medicine | Admitting: Emergency Medicine

## 2023-05-22 DIAGNOSIS — M7989 Other specified soft tissue disorders: Secondary | ICD-10-CM | POA: Diagnosis not present

## 2023-05-22 DIAGNOSIS — M19071 Primary osteoarthritis, right ankle and foot: Secondary | ICD-10-CM

## 2023-05-22 DIAGNOSIS — M79671 Pain in right foot: Secondary | ICD-10-CM | POA: Diagnosis not present

## 2023-05-22 MED ORDER — DICLOFENAC SODIUM 1 % EX GEL: 2.0000 g | Freq: Four times a day (QID) | CUTANEOUS | 0 refills | Status: DC

## 2023-05-22 NOTE — Discharge Instructions (Addendum)
The xray does not have any acute finding. There are several degenerative changes including osteoarthritis. The arthritis is categorized as severe. This is likely the cause of your foot pain, but you also may have a soft tissue injury.  I recommend to use the Voltaren gel 4x daily. This is meant for arthritis.  Apply ice and elevate the leg as often as possible. Please follow up with your podiatrist. Call them to move your appointment sooner. I also recommend you see the orthopedic specialists; they may be able to provide further intervention.

## 2023-05-22 NOTE — ED Provider Notes (Signed)
[facility]    CSN: [id number] Arrival date & time: 05/22/23  1436     History   Chief Complaint Chief Complaint  Patient presents with   Leg Pain    HPI [name] is a 72 y.o. female.  Here with 1 week of right foot pain. Worse with pressure and walking. Located at base of 3rd/4th toes Today reporting 10/10 pain with walking She denies any injury or trauma Has used tylenol  Denies history of this  Frustrated because she cannot walk without pain. Usually ambulates well with cane.  Past Medical History:  Diagnosis Date   Allergy    Anemia    Arthritis    Asthma    Dianosed years ago-no meds at this time   Cancer [facility]) 2021   endometrial    Cataract    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 name of MD    Dementia (HCC)    Diabetes mellitus    type 2    Diverticulosis    Dyslipidemia    Dyspnea    with exertion    Dysrhythmia    "skips beats"   Endometrial cancer (HCC) 2021   Family history of adverse reaction to anesthesia    son stopped breathing during surgery , mother slow to wake up    Fibromyalgia 1980's   Generalized anxiety disorder    GERD (gastroesophageal reflux disease)    occ- will use Tums and Prolosec  prn    Heart murmur    Hyperlipemia    Hypertension    not on blood pressure meds due to allergies - last dose of meds- months ago    Hypothyroidism    Hypothyroidism (acquired)    Internal hemorrhoids    Irritable bowel syndrome 1980's   Liver cancer (HCC)    Major depressive disorder    Migraine headaches 1980's   On meds-well controlled   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   Morbid obesity (HCC)    Pernicious anemia    Pneumonia 2009   Several times over the past several years   PONV (postoperative nausea and vomiting)    was told by neurologist due to calcificaton in brain not  to be put to sleep, N/V during Op and Recovery    Recurrent upper respiratory infection (URI)    Sleep apnea    no cpap   Subarachnoid hemorrhage (HCC) 2010   Vaginal polyp    benign per pt    Patient Active Problem List   Diagnosis Date Noted   Jaundice 03/26/2023   Intermediate stage nonexudative age-related macular degeneration of left eye 12/19/2022   Bilateral hearing loss 06/04/2022   Dizziness 06/04/2022   Perforation of left tympanic membrane 06/04/2022   Primary insomnia 05/06/2022   B12 deficiency 05/06/2022   Local edema 05/06/2022   Neuropathic pain    Essential hypertension    Controlled type 2 diabetes mellitus with hyperglycemia, without long-term current use of insulin (HCC)    Depression    T7 vertebral fracture (HCC) 02/07/2022   Malnutrition of moderate degree 02/05/2022   Closed T7 fracture (HCC) 01/31/2022   Dementia (HCC) 01/31/2022   Diabetes mellitus without complication (HCC) 01/31/2022   Dyslipidemia 01/31/2022   Hypertension 01/31/2022   Hypothyroidism (acquired) 01/31/2022   Cervical stenosis of spinal canal 01/31/2022   Falls frequently  01/31/2022   Bilateral hand pain 01/31/2022   Vaginal discharge 11/26/2021   Anxiety and depression 10/30/2020   Carotid artery stenosis 10/30/2020   Facial droop 10/29/2020   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   HLD (hyperlipidemia) 10/29/2020   Endometrial cancer (HCC) 10/18/2020   CAD (coronary artery disease) 05/11/2020   Acute encephalopathy 02/26/2020   Hypothyroidism    Cerebral calcification 02/10/2020   Impairment of balance 02/10/2020   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   Essential tremor 06/24/2018   Migraine without aura 06/24/2018   Stress at home 08/28/2015   Post-menopausal bleeding 06/16/2011   Kidney disease 06/16/2011   Colon polyp 06/16/2011   Vaginal polyp 06/16/2011   Chronic diarrhea 06/16/2011   Diverticulitis 06/16/2011   DUB (dysfunctional uterine bleeding) 06/16/2011    Fatty liver 06/16/2011   Type 2 diabetes mellitus with stage 3 chronic kidney disease, without long-term current use of insulin (HCC) 06/16/2011    Past Surgical History:  Procedure Laterality Date   CATARACT EXTRACTION Right    CHOLECYSTECTOMY  1983   COLONOSCOPY  2008   DB   DILATION AND CURETTAGE OF UTERUS  1972   HYSTEROSCOPY WITH D & C  06/29/2011   Procedure: DILATATION AND CURETTAGE (D&C) /HYSTEROSCOPY;  Surgeon: Reva Bores, MD;  Location: [facility];  Service: Gynecology;  Laterality: N/A;   ROBOTIC ASSISTED TOTAL HYSTERECTOMY WITH BILATERAL SALPINGO OOPHERECTOMY N/A 10/27/2020   Procedure: XI ROBOTIC ASSISTED TOTAL HYSTERECTOMY WITH BILATERAL SALPINGO OOPHORECTOMY;  Surgeon: Adolphus Birchwood, MD;  Location: [facility];  Service: Gynecology;  Laterality: N/A;   SENTINEL NODE BIOPSY N/A 10/27/2020   Procedure: SENTINEL NODE BIOPSY;  Surgeon: Adolphus Birchwood, MD;  Location: [facility];  Service: Gynecology;  Laterality: N/A;    OB History     Gravida  5   Para  3   Term  3   Preterm  0   AB  2   Living  3      SAB  2   IAB  0   Ectopic  0   Multiple      Live Births  3            Home Medications    Prior to Admission medications   Medication Sig Start Date End Date Taking? Authorizing Provider  diclofenac Sodium (VOLTAREN) 1 % GEL Apply 2 g topically 4 (four) times daily. 05/22/23  Yes Baily Serpe, Lurena Joiner, PA-C  Cholecalciferol (VITAMIN D3) 25 MCG (1000 UT) capsule Take 1,000 Units by mouth daily.    [provider]  cyanocobalamin (,VITAMIN B-12,) 1000 MCG/ML injection Inject 1,000 mcg into the muscle every 30 (thirty) days.     [provider]  dicyclomine (BENTYL) 20 MG tablet Take 1 tablet (20 mg total) by mouth every 8 (eight) hours as needed for spasms (AB pain). 03/14/23   Doree Albee, PA-C  Evolocumab (REPATHA SURECLICK) 140 MG/ML SOAJ INJECT 1 PEN SUBCUTANEOUSLY EVERY 14 DAYS Patient taking differently: Inject 140 mg into the skin every 14  (fourteen) days. 06/04/22   Jeani Sow, MD  glipiZIDE (GLUCOTROL) 5 MG tablet Take 2.5 mg by mouth every morning. 04/08/23   [provider]  glipiZIDE (GLUCOTROL) 5 MG tablet Take 0.5 tablets (2.5 mg total) by mouth daily before breakfast. 04/30/23   Jeani Sow, MD  glucose blood (ACCU-CHEK GUIDE) test strip Use to test blood sugar 3 times daily 06/14/22   Jeani Sow, MD  levothyroxine (SYNTHROID) 50 MCG tablet Take 1 tablet (  50 mcg total) by mouth daily before breakfast. 04/17/23   Jeani Sow, MD  MAGNESIUM CITRATE PO Take 1 tablet by mouth 2 (two) times daily.    [provider]  Multiple Vitamin (MULTIVITAMIN WITH MINERALS) TABS tablet Take 1 tablet by mouth daily. 02/15/22   Raulkar, Drema Pry, MD  Na Sulfate-K Sulfate-Mg Sulf 17.5-3.13-1.6 GM/177ML SOLN  05/06/23   [provider]  potassium chloride SA (KLOR-CON M) 20 MEQ tablet Take 1 tablet (20 mEq total) by mouth 2 (two) times daily. 03/08/23   Jeani Sow, MD  pregabalin (LYRICA) 50 MG capsule Take 50 mg by mouth daily as needed (for pain).    [provider]  Probiotic Product (VSL#3 DS) PACK Take 1 each by mouth daily. 05/06/23   Doree Albee, PA-C  SYSTANE HYDRATION PF 0.4-0.3 % SOLN Place 1-2 drops into both eyes in the morning.    [provider]  torsemide (DEMADEX) 20 MG tablet Take 1 tablet (20 mg total) by mouth daily. 04/16/23   Jeani Sow, MD  TRULANCE 3 MG TABS Take 1 tablet by mouth daily. 04/12/23   [provider]    Family History Family History  Problem Relation Age of Onset   Anesthesia problems Mother        hard to wake post op    Cerebrovascular Disease Mother        Never had stroke, but had CEA.   Liver cancer Father        mets to liver    Bladder Cancer Father    Colon cancer Father        mets to colon    Schizophrenia Daughter        Schizoaffective disorder   Anesthesia problems Son        stopped breathing post op     Tremor Son    Anesthesia problems Granddaughter        PONV   High blood pressure Other    Diabetes Other    Arthritis Other    Schizophrenia Child    Tremor Child    Dementia Neg Hx    Esophageal cancer Neg Hx    Rectal cancer Neg Hx    Stomach cancer Neg Hx    Colon polyps Neg Hx     Social History Social History   Tobacco Use   Smoking status: Never   Smokeless tobacco: Never  Vaping Use   Vaping Use: Never used  Substance Use Topics   Alcohol use: Not Currently    Comment: quit 1978   Drug use: Never     Allergies   Atenolol, Atorvastatin, Canagliflozin, Losartan potassium, Acarbose, Cinnamon, Empagliflozin, Glimepiride, Metformin and related, Other, Pioglitazone, Pravastatin, Sitagliptin, Spironolactone-hctz, Statins, Sulfamethoxazole, Tramadol hcl, Atorvastatin calcium, Augmentin [amoxicillin-pot clavulanate], Crestor [rosuvastatin], Rosuvastatin calcium, Bumex [bumetanide], Lasix [furosemide], and Sulfa antibiotics   Review of Systems Review of Systems As per HPI  Physical Exam Triage Vital Signs ED Triage Vitals  Enc Vitals Group     BP 05/22/23 1543 (!) 166/107     Pulse Rate 05/22/23 1543 91     Resp 05/22/23 1543 18     Temp 05/22/23 1543 98.2 F (36.8 C)     Temp Source 05/22/23 1543 Oral     SpO2 05/22/23 1543 99 %     Weight --      Height --      Head Circumference --      Peak Flow --  Pain Score 05/22/23 1544 10     Pain Loc --      Pain Edu? --      Excl. in GC? --    No data found.  Updated Vital Signs BP (!) 166/107 (BP Location: Left Arm)   Pulse 91   Temp 98.2 F (36.8 C) (Oral)   Resp 18   SpO2 99%    Physical Exam Vitals and nursing note reviewed.  Constitutional:      General: She is not in acute distress.    Appearance: Normal appearance.  HENT:     Mouth/Throat:     Pharynx: Oropharynx is clear.  Cardiovascular:     Rate and Rhythm: Normal rate and regular rhythm.     Pulses: Normal pulses.     Heart  sounds: Normal heart sounds.  Pulmonary:     Effort: Pulmonary effort is normal.     Breath sounds: Normal breath sounds.  Musculoskeletal:        General: Normal range of motion.     Right ankle: Normal range of motion.     Right foot: Normal range of motion and normal capillary refill. Swelling and tenderness present. No deformity or foot drop. Normal pulse.     Comments: Swelling of bilateral feet. Does not extend past ankles. Tender over the dorsal foot, at base of 3rd/4th toes. Non tender ankles or toes. Good ROM at ankle. Distal sensation intact. Strong DP pulse. Cap refill < 2 seconds   Skin:    General: Skin is warm and dry.     Capillary Refill: Capillary refill takes less than 2 seconds.     Findings: No bruising or rash.  Neurological:     Mental Status: She is alert and oriented to person, place, and time.     Gait: Gait abnormal.     UC Treatments / Results  Labs (all labs ordered are listed, but only abnormal results are displayed) Labs Reviewed - No data to display  EKG   Radiology DG Foot Complete Right  Result Date: 05/22/2023 CLINICAL DATA:  Pain for 1 week. No known injury. Worsened yesterday. EXAM: RIGHT FOOT COMPLETE - 3+ VIEW COMPARISON:  None Available. FINDINGS: There is diffuse decreased bone mineralization. Severe great toe metatarsophalangeal joint space narrowing, subchondral sclerosis/cystic change, and peripheral osteophytosis. There is approximately 3 mm likely degenerative lateralization of the proximal phalanx with respect to the great toe metatarsal head. Mild-to-moderate interphalangeal joint space narrowing diffusely. Severe second through fourth tarsometatarsal joint space narrowing, subchondral sclerosis, and peripheral osteophytosis. Minimal calcaneocuboid joint space narrowing and peripheral degenerative spurring. Mild dorsal forefoot soft tissue swelling at the level of the distal metatarsals. IMPRESSION: 1. Severe great toe metatarsophalangeal  joint osteoarthritis. 2. Severe second through fourth tarsometatarsal joint osteoarthritis. Electronically Signed   By: Neita Garnet M.D.   On: 05/22/2023 16:36    Procedures Procedures (including critical care time)  Medications Ordered in UC Medications - No data to display  Initial Impression / Assessment and Plan / UC Course  I have reviewed the triage vital signs and the nursing notes.  Pertinent labs & imaging results that were available during my care of the patient were reviewed by me and considered in my medical decision making (see chart for details).  Xray showing severe osteoarthritis. No acute abnormality. Recommend Voltaren gel QID, tylenol, ice and elevate, and follow up with podiatry. Also provided with orthopedics info. Could be the OA causing pain or more likely a soft tissue  something such as neuroma. Might benefit from U/S. Discussed limited in what the urgent care can do for this since she needs to see specialist. Can attempt to control pain but needs follow up. Patient daughter was asking for a wheelchair from the urgent care, patient herself asking for crutches. Discussed UC does not have wheelchairs to give. Do not recommend crutches given patient is 9 with history of frequent falls. Recommend close follow up with ortho/podiatry. Provided with the [facility] urgent care hours as well.  Final Clinical Impressions(s) / UC Diagnoses   Final diagnoses:  Osteoarthritis of right foot, unspecified osteoarthritis type  Right foot pain     Discharge Instructions      The xray does not have any acute finding. There are several degenerative changes including osteoarthritis. The arthritis is categorized as severe. This is likely the cause of your foot pain, but you also may have a soft tissue injury.  I recommend to use the Voltaren gel 4x daily. This is meant for arthritis.  Apply ice and elevate the leg as often as possible. Please follow up with your podiatrist.  Call them to move your appointment sooner. I also recommend you see the orthopedic specialists; they may be able to provide further intervention.      ED Prescriptions     Medication Sig Dispense Auth. Provider   diclofenac Sodium (VOLTAREN) 1 % GEL Apply 2 g topically 4 (four) times daily. 100 g Reeanna Acri, Lurena Joiner, PA-C      PDMP not reviewed this encounter.   Maysie Parkhill, Lurena Joiner, [place] 05/22/23 1736

## 2023-05-22 NOTE — ED Triage Notes (Signed)
Pt c/o rt foot/leg pain x1wk and became worse yesterday. States pain worse walking. Denies injury but states my have turned her foot wrong while walking. States taking tylenol with no relief.

## 2023-05-23 ENCOUNTER — Telehealth (HOSPITAL_COMMUNITY): Payer: Self-pay

## 2023-05-23 NOTE — Telephone Encounter (Signed)
Patient;s daughter calling in and states her mother was seen yesterday and an oral pain medication was supposed to be sent in. Went over the discharge note with Patient and daughter that states the Patient is to use topical diclofenac.   They state this medication has been picked up and used but the Patient requested an oral medication as well for pain yesterday suring her visit. Provider the Patient saw yesterday is in office today will send a secure chat to inquire.

## 2023-05-23 NOTE — Telephone Encounter (Signed)
Called and spoke with the Patient's daughter and and the Patient. They were informed of the conversatio had with the provider. Due to Patient allergies she was prescribed a topical pain medication and no oral medication was recommended.   Patient verbalized understanding and her daughter. Informed them if pain was to get worse she would need to be re-evaluated.

## 2023-05-24 ENCOUNTER — Emergency Department (HOSPITAL_COMMUNITY)
Admission: EM | Admit: 2023-05-24 | Discharge: 2023-05-24 | Disposition: A | Discharge status: Home / Self Care | Payer: 59 | Attending: Emergency Medicine | Admitting: Emergency Medicine

## 2023-05-24 ENCOUNTER — Encounter: Payer: Self-pay | Admitting: Family Medicine

## 2023-05-24 ENCOUNTER — Ambulatory Visit (INDEPENDENT_AMBULATORY_CARE_PROVIDER_SITE_OTHER): Payer: 59 | Admitting: Family Medicine

## 2023-05-24 VITALS — BP 142/80 | HR 86 | Temp 98.6°F | Ht 62.0 in

## 2023-05-24 DIAGNOSIS — M109 Gout, unspecified: Secondary | ICD-10-CM | POA: Diagnosis not present

## 2023-05-24 DIAGNOSIS — M79671 Pain in right foot: Secondary | ICD-10-CM

## 2023-05-24 DIAGNOSIS — R112 Nausea with vomiting, unspecified: Secondary | ICD-10-CM | POA: Diagnosis not present

## 2023-05-24 DIAGNOSIS — F17210 Nicotine dependence, cigarettes, uncomplicated: Secondary | ICD-10-CM | POA: Diagnosis not present

## 2023-05-24 DIAGNOSIS — R197 Diarrhea, unspecified: Secondary | ICD-10-CM | POA: Diagnosis not present

## 2023-05-24 DIAGNOSIS — M10071 Idiopathic gout, right ankle and foot: Secondary | ICD-10-CM | POA: Diagnosis not present

## 2023-05-24 DIAGNOSIS — I493 Ventricular premature depolarization: Secondary | ICD-10-CM | POA: Diagnosis not present

## 2023-05-24 DIAGNOSIS — I2510 Atherosclerotic heart disease of native coronary artery without angina pectoris: Secondary | ICD-10-CM | POA: Diagnosis not present

## 2023-05-24 DIAGNOSIS — K5730 Diverticulosis of large intestine without perforation or abscess without bleeding: Secondary | ICD-10-CM | POA: Diagnosis not present

## 2023-05-24 DIAGNOSIS — R9431 Abnormal electrocardiogram [ECG] [EKG]: Secondary | ICD-10-CM | POA: Diagnosis not present

## 2023-05-24 DIAGNOSIS — M47814 Spondylosis without myelopathy or radiculopathy, thoracic region: Secondary | ICD-10-CM | POA: Diagnosis not present

## 2023-05-24 LAB — BASIC METABOLIC PANEL
Anion gap: 9 (ref 5–15)
BUN: 22 mg/dL (ref 8–23)
CO2: 24 mmol/L (ref 22–32)
Calcium: 9.2 mg/dL (ref 8.9–10.3)
Chloride: 106 mmol/L (ref 98–111)
Creatinine, Ser: 1.14 mg/dL — ABNORMAL HIGH (ref 0.44–1.00)
GFR, Estimated: 51 mL/min — ABNORMAL LOW (ref >60)
Glucose, Bld: 131 mg/dL — ABNORMAL HIGH (ref 70–99)
Potassium: 4.1 mmol/L (ref 3.5–5.1)
Sodium: 139 mmol/L (ref 135–145)

## 2023-05-24 LAB — CBC WITH DIFFERENTIAL/PLATELET
Abs Immature Granulocytes: 0.01 10*3/uL (ref 0.00–0.07)
Basophils Absolute: 0 10*3/uL (ref 0.0–0.1)
Basophils Relative: 1 %
Eosinophils Absolute: 0.1 10*3/uL (ref 0.0–0.5)
Eosinophils Relative: 2 %
HCT: 31.5 % — ABNORMAL LOW (ref 36.0–46.0)
Hemoglobin: 10.4 g/dL — ABNORMAL LOW (ref 12.0–15.0)
Immature Granulocytes: 0 %
Lymphocytes Relative: 29 %
Lymphs Abs: 1.2 10*3/uL (ref 0.7–4.0)
MCH: 31.3 pg (ref 26.0–34.0)
MCHC: 33 g/dL (ref 30.0–36.0)
MCV: 94.9 fL (ref 80.0–100.0)
Monocytes Absolute: 0.4 10*3/uL (ref 0.1–1.0)
Monocytes Relative: 10 %
Neutro Abs: 2.5 10*3/uL (ref 1.7–7.7)
Neutrophils Relative %: 58 %
Platelets: 199 10*3/uL (ref 150–400)
RBC: 3.32 MIL/uL — ABNORMAL LOW (ref 3.87–5.11)
RDW: 12.9 % (ref 11.5–15.5)
WBC: 4.2 10*3/uL (ref 4.0–10.5)
nRBC: 0 % (ref 0.0–0.2)

## 2023-05-24 LAB — SEDIMENTATION RATE: Sed Rate: 56 mm/hr — ABNORMAL HIGH (ref 0–22)

## 2023-05-24 LAB — URIC ACID: Uric Acid, Serum: 8.4 mg/dL — ABNORMAL HIGH (ref 2.5–7.1)

## 2023-05-24 LAB — LACTIC ACID, PLASMA: Lactic Acid, Venous: 0.7 mmol/L (ref 0.5–1.9)

## 2023-05-24 MED ORDER — OXYCODONE HCL 5 MG PO TABS
5.0000 mg | ORAL_TABLET | Freq: Once | ORAL | Status: AC
  Administered 2023-05-24: 5 mg via ORAL
  Filled 2023-05-24: qty 1

## 2023-05-24 MED ORDER — OXYCODONE HCL 5 MG PO TABS: 5.0000 mg | ORAL_TABLET | Freq: Four times a day (QID) | ORAL | 0 refills | Status: DC | PRN

## 2023-05-24 NOTE — Progress Notes (Signed)
Subjective:     Patient ID: [name], female    DOB: 12-18-1950, 72 y.o.   MRN: [id number]  Chief Complaint  Patient presents with   Severe right leg pain    HPI-here w/daughter  Severe right foot pain-foot. No injury.  Cleans a lot.  Started 2 weeks ago but getting worse.  Seen in urgent care on 6/5.  X-ray no fracture.  Pain at first w/weight bearing, now mild pain all the time but severe w/weight bearing.  Hard to do anything.  Can't take NSAID's due to CHRONIC KIDNEY DISEASE.  No history of gout but +FH gout..  Torsemide-stopped and felt some better then took again and pain so stopped again but edema.  Health Maintenance Due  Topic Date Due   Medicare Annual Wellness (AWV)  Never done   OPHTHALMOLOGY EXAM  Never done   Colonoscopy  06/04/2023    Past Medical History:  Diagnosis Date   Allergy    Anemia    Arthritis    Asthma    Dianosed years ago-no meds at this time   Cancer [facility]) 2021   endometrial    Cataract    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 name of MD    Dementia (HCC)    Diabetes mellitus    type 2    Diverticulosis    Dyslipidemia    Dyspnea    with exertion    Dysrhythmia    "skips beats"   Endometrial cancer (HCC) 2021   Family history of adverse reaction to anesthesia    son stopped breathing during surgery , mother slow to wake up    Fibromyalgia 1980's   Generalized anxiety disorder    GERD (gastroesophageal reflux disease)    occ- will use Tums and Prolosec  prn    Heart murmur    Hyperlipemia    Hypertension    not on blood pressure meds due to allergies - last dose of meds- months ago    Hypothyroidism    Hypothyroidism (acquired)    Internal hemorrhoids    Irritable bowel syndrome 1980's   Liver cancer (HCC)    Major depressive disorder    Migraine headaches 1980's   On meds-well  controlled   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   Morbid obesity (HCC)    Pernicious anemia    Pneumonia 2009   Several times over the past several years   PONV (postoperative nausea and vomiting)    was told by neurologist due to calcificaton in brain not to be put to sleep, N/V during Op and Recovery    Recurrent upper respiratory infection (URI)    Sleep apnea    no cpap   Subarachnoid hemorrhage (HCC) 2010   Vaginal polyp    benign per pt    Past Surgical History:  Procedure Laterality Date   CATARACT EXTRACTION Right    CHOLECYSTECTOMY  1983   COLONOSCOPY  2008   DB   DILATION AND CURETTAGE OF UTERUS  1972   HYSTEROSCOPY WITH D & C  06/29/2011   Procedure: DILATATION AND CURETTAGE (D&C) /HYSTEROSCOPY;  Surgeon: Reva Bores, MD;  Location: [facility];  Service: Gynecology;  Laterality: N/A;   ROBOTIC ASSISTED TOTAL HYSTERECTOMY WITH BILATERAL SALPINGO OOPHERECTOMY N/A 10/27/2020   Procedure: XI ROBOTIC ASSISTED TOTAL HYSTERECTOMY WITH BILATERAL SALPINGO  OOPHORECTOMY;  Surgeon: Adolphus Birchwood, MD;  Location: [facility];  Service: Gynecology;  Laterality: N/A;   SENTINEL NODE BIOPSY N/A 10/27/2020   Procedure: SENTINEL NODE BIOPSY;  Surgeon: Adolphus Birchwood, MD;  Location: [facility];  Service: Gynecology;  Laterality: N/A;     Current Outpatient Medications:    Cholecalciferol (VITAMIN D3) 25 MCG (1000 UT) capsule, Take 1,000 Units by mouth daily., Disp: , Rfl:    cyanocobalamin (,VITAMIN B-12,) 1000 MCG/ML injection, Inject 1,000 mcg into the muscle every 30 (thirty) days. , Disp: , Rfl:    diclofenac Sodium (VOLTAREN) 1 % GEL, Apply 2 g topically 4 (four) times daily., Disp: 100 g, Rfl: 0   dicyclomine (BENTYL) 20 MG tablet, Take 1 tablet (20 mg total) by mouth every 8 (eight) hours as needed for spasms (AB pain)., Disp: 30 tablet, Rfl: 0   Evolocumab (REPATHA SURECLICK) 140 MG/ML SOAJ, INJECT 1 PEN SUBCUTANEOUSLY EVERY 14 DAYS (Patient taking differently: Inject 140 mg into the skin  every 14 (fourteen) days.), Disp: 6 mL, Rfl: 3   glipiZIDE (GLUCOTROL) 5 MG tablet, Take 2.5 mg by mouth every morning., Disp: , Rfl:    glipiZIDE (GLUCOTROL) 5 MG tablet, Take 0.5 tablets (2.5 mg total) by mouth daily before breakfast., Disp: 45 tablet, Rfl: 1   glucose blood (ACCU-CHEK GUIDE) test strip, Use to test blood sugar 3 times daily, Disp: 100 each, Rfl: 2   levothyroxine (SYNTHROID) 50 MCG tablet, Take 1 tablet (50 mcg total) by mouth daily before breakfast., Disp: 90 tablet, Rfl: 3   MAGNESIUM CITRATE PO, Take 1 tablet by mouth 2 (two) times daily., Disp: , Rfl:    Multiple Vitamin (MULTIVITAMIN WITH MINERALS) TABS tablet, Take 1 tablet by mouth daily., Disp: 30 tablet, Rfl: 0   Na Sulfate-K Sulfate-Mg Sulf 17.5-3.13-1.6 GM/177ML SOLN, , Disp: , Rfl:    pantoprazole (PROTONIX) 20 MG tablet, Take 20 mg by mouth 2 (two) times daily. As needed., Disp: , Rfl:    potassium chloride SA (KLOR-CON M) 20 MEQ tablet, Take 1 tablet (20 mEq total) by mouth 2 (two) times daily., Disp: 180 tablet, Rfl: 1   pregabalin (LYRICA) 50 MG capsule, Take 50 mg by mouth daily as needed (for pain)., Disp: , Rfl:    Probiotic Product (VSL#3 DS) PACK, Take 1 each by mouth daily., Disp: 20 each, Rfl: 0   SYSTANE HYDRATION PF 0.4-0.3 % SOLN, Place 1-2 drops into both eyes in the morning., Disp: , Rfl:    torsemide (DEMADEX) 20 MG tablet, Take 1 tablet (20 mg total) by mouth daily., Disp: 90 tablet, Rfl: 1   TRULANCE 3 MG TABS, Take 1 tablet by mouth daily., Disp: , Rfl:   Current Facility-Administered Medications:    cyanocobalamin (VITAMIN B12) injection 1,000 mcg, 1,000 mcg, Intramuscular, Q30 days, Jeani Sow, MD, 1,000 mcg at 04/05/23 1345  Allergies  Allergen Reactions   Atenolol Other (See Comments)    Stomach problems, chills   Atorvastatin Other (See Comments)    Retained fluid   Canagliflozin Other (See Comments)    ALL DIABETIC MEDS PER PT   abdominal pain, headache   Losartan Potassium  Other (See Comments)    chills, HA, stomach pain   Acarbose Nausea Only and Other (See Comments)     headache,nausea   Cinnamon Other (See Comments)    Stomach pain   Empagliflozin Other (See Comments)     headaches   Glimepiride Other (See Comments)    ALL DIABETIC MEDS  PER PT  abdominal pain, headache   Metformin And Related Nausea And Vomiting and Other (See Comments)    Patient states that she has severe chills, headache and cramping additionally. Says blood sugar is uncontrolled because of this   Other Other (See Comments)    ALL DIABETIC MEDS PER PT    Pioglitazone Other (See Comments)    ALL DIABETIC MEDS PER PT = peripheral edema   Pravastatin Other (See Comments)    Retained fluid   Sitagliptin Other (See Comments)    ALL DIABETIC MEDS PER PT  Other reaction(s): headache   Spironolactone-Hctz Other (See Comments)   Statins Other (See Comments)    Myalgia   Sulfamethoxazole Other (See Comments)    ALL DIABETIC MEDS PER PT    Tramadol Hcl Itching   Atorvastatin Calcium Other (See Comments)     retain fld   Augmentin [Amoxicillin-Pot Clavulanate] Other (See Comments)    Caused jaundice   Crestor [Rosuvastatin] Other (See Comments)   Rosuvastatin Calcium    Bumex [Bumetanide] Rash and Other (See Comments)    Chills, rash, headaches, stomach issues also   Lasix [Furosemide] Rash and Other (See Comments)    Sugars increased   Sulfa Antibiotics Rash and Other (See Comments)    Mother has told patient in the past not to take, she cannot recall there reaction   ROS neg/noncontributory except as noted HPI/below  Upset about health and so much going wrong.       Objective:     BP (!) 142/80 (BP Location: Left Arm, Patient Position: Sitting)   Pulse 86   Temp 98.6 F (37 C) (Temporal)   Ht 5\' 2"  (1.575 m)   SpO2 97%   BMI 34.61 kg/m  Wt Readings from Last 3 Encounters:  05/07/23 189 lb 4 oz (85.8 kg)  05/06/23 190 lb (86.2 kg)  04/05/23 187 lb 9.6 oz (85.1  kg)    Physical Exam   Gen: [facility] NAD HEENT: NCAT, conjunctiva not injected, sclera nonicteric EXT:  1+ edema right foot-pitting and up leg.  Has palpable varicosities.  Negative Homan's.  Pain and redness right foot/toes.  Swelling.  Warm.  DP 2+.   MSK: in w/c  NEURO: A&O x3.  CN II-XII intact.  PSYCH: normal mood. Good eye contact     Assessment & Plan:  Right foot pain   Right foot pain.  Doubt fracture(negative x-ray few days ago and no mechanism).  Could be DVT-can't get outpt doppler/D dimer  on a Friday at 430pm.  Could be gout(no history of).  Inflammatory arthritis, other.  Patient going to ER for evaluation.  Can't do NSAID, can't do prednisone as sugars elevated.    No follow-ups on file.  Angelena Sole, MD

## 2023-05-24 NOTE — Discharge Instructions (Signed)
1.  You have gout in your foot.  The usual treatment for gout includes either anti-inflammatory medications or steroids.  At this time you do not feel comfortable taking either of these due to your underlying medical conditions.  You will be treated with a pain medication called oxycodone.  You may take 1 of these tablets every 6 hours if needed.  Call your doctor on Monday to discuss management of your gout and address concerns for your other medical conditions and allergies.

## 2023-05-24 NOTE — Patient Instructions (Signed)
To Emergency Department as can't get tests and considering blood clot, gout, etc.

## 2023-05-24 NOTE — ED Provider Notes (Signed)
[place] EMERGENCY DEPARTMENT AT [facility] Provider Note   CSN: [id number] Arrival date & time: 05/24/23  1712     History  Chief Complaint  Patient presents with   Foot Pain    [name] is a 72 y.o. female.  HPI Patient reports her foot started getting sore about 10 days ago.  It was uncomfortable but tolerable.  Over the past several days has become exquisitely painful.  She reports if she puts any weight on it it hurts terribly and even light touch across the top of the foot is very painful.  No injuries.  Patient denies any similar episodes.  She denies having problems with recurrent red or swollen other joints.  Patient was seen at urgent care and diagnosed with osteoarthritis.  Patient reports many allergies.  She was given diclofenac to apply topically.  She reports this is not helping.  Fever no chills no general malaise.  Patient denies any personal history of gout but reports she recalls her mother having been diagnosed with gout.    Home Medications Prior to Admission medications   Medication Sig Start Date End Date Taking? Authorizing Provider  oxyCODONE (OXY IR/ROXICODONE) 5 MG immediate release tablet Take 1 tablet (5 mg total) by mouth every 6 (six) hours as needed for moderate pain. 05/24/23  Yes Arby Barrette, MD  Cholecalciferol (VITAMIN D3) 25 MCG (1000 UT) capsule Take 1,000 Units by mouth daily.    [provider]  cyanocobalamin (,VITAMIN B-12,) 1000 MCG/ML injection Inject 1,000 mcg into the muscle every 30 (thirty) days.     [provider]  diclofenac Sodium (VOLTAREN) 1 % GEL Apply 2 g topically 4 (four) times daily. 05/22/23   Rising, Lurena Joiner, PA-C  dicyclomine (BENTYL) 20 MG tablet Take 1 tablet (20 mg total) by mouth every 8 (eight) hours as needed for spasms (AB pain). 03/14/23   Doree Albee, PA-C  Evolocumab (REPATHA SURECLICK) 140 MG/ML SOAJ INJECT 1 PEN SUBCUTANEOUSLY EVERY 14 DAYS Patient taking differently:  Inject 140 mg into the skin every 14 (fourteen) days. 06/04/22   Jeani Sow, MD  glipiZIDE (GLUCOTROL) 5 MG tablet Take 2.5 mg by mouth every morning. 04/08/23   [provider]  glipiZIDE (GLUCOTROL) 5 MG tablet Take 0.5 tablets (2.5 mg total) by mouth daily before breakfast. 04/30/23   Jeani Sow, MD  glucose blood (ACCU-CHEK GUIDE) test strip Use to test blood sugar 3 times daily 06/14/22   Jeani Sow, MD  levothyroxine (SYNTHROID) 50 MCG tablet Take 1 tablet (50 mcg total) by mouth daily before breakfast. 04/17/23   Jeani Sow, MD  MAGNESIUM CITRATE PO Take 1 tablet by mouth 2 (two) times daily.    [provider]  Multiple Vitamin (MULTIVITAMIN WITH MINERALS) TABS tablet Take 1 tablet by mouth daily. 02/15/22   Raulkar, Drema Pry, MD  Na Sulfate-K Sulfate-Mg Sulf 17.5-3.13-1.6 GM/177ML SOLN  05/06/23   [provider]  pantoprazole (PROTONIX) 20 MG tablet Take 20 mg by mouth 2 (two) times daily. As needed. 05/09/23   [provider]  potassium chloride SA (KLOR-CON M) 20 MEQ tablet Take 1 tablet (20 mEq total) by mouth 2 (two) times daily. 03/08/23   Jeani Sow, MD  pregabalin (LYRICA) 50 MG capsule Take 50 mg by mouth daily as needed (for pain).    [provider]  Probiotic Product (VSL#3 DS) PACK Take 1 each by mouth daily. 05/06/23   Doree Albee,  PA-C  SYSTANE HYDRATION PF 0.4-0.3 % SOLN Place 1-2 drops into both eyes in the morning.    [provider]  torsemide (DEMADEX) 20 MG tablet Take 1 tablet (20 mg total) by mouth daily. 04/16/23   Jeani Sow, MD  TRULANCE 3 MG TABS Take 1 tablet by mouth daily. 04/12/23   [provider]      Allergies    Atenolol, Atorvastatin, Canagliflozin, Losartan potassium, Acarbose, Cinnamon, Empagliflozin, Glimepiride, Metformin and related, Other, Pioglitazone, Pravastatin, Sitagliptin, Spironolactone-hctz, Statins, Sulfamethoxazole, Tramadol hcl, Atorvastatin  calcium, Augmentin [amoxicillin-pot clavulanate], Crestor [rosuvastatin], Rosuvastatin calcium, Bumex [bumetanide], Lasix [furosemide], and Sulfa antibiotics    Review of Systems   Review of Systems  Physical Exam Updated Vital Signs BP (!) 159/82 (BP Location: Left Arm)   Pulse 89   Temp 98.6 F (37 C) (Oral)   Resp 18   Ht 5\' 2"  (1.575 m)   Wt 83.9 kg   SpO2 96%   BMI 33.84 kg/m  Physical Exam Constitutional:      Comments: Nontoxic clinically well in appearance.  HENT:     Mouth/Throat:     Pharynx: Oropharynx is clear.  Eyes:     Extraocular Movements: Extraocular movements intact.  Pulmonary:     Effort: Pulmonary effort is normal.  Musculoskeletal:     Comments: Right foot is moderately swollen.  Tenderness concentrates to the top of the forefoot.  Some diffuse erythema of the top of the foot.  Patient nontender to palpation into the sole of the foot at the heel and midfoot.  Nontender to compression over the ankle.  Calves are soft and pliable.  No effusions or erythema at the knee.  Skin:    General: Skin is warm and dry.  Neurological:     General: No focal deficit present.     ED Results / Procedures / Treatments   Labs (all labs ordered are listed, but only abnormal results are displayed) Labs Reviewed  BASIC METABOLIC PANEL - Abnormal; Notable for the following components:      Result Value   Glucose, Bld 131 (*)    Creatinine, Ser 1.14 (*)    GFR, Estimated 51 (*)    All other components within normal limits  CBC WITH DIFFERENTIAL/PLATELET - Abnormal; Notable for the following components:   RBC 3.32 (*)    Hemoglobin 10.4 (*)    HCT 31.5 (*)    All other components within normal limits  URIC ACID - Abnormal; Notable for the following components:   Uric Acid, Serum 8.4 (*)    All other components within normal limits  LACTIC ACID, PLASMA  LACTIC ACID, PLASMA  SEDIMENTATION RATE  C-REACTIVE PROTEIN    EKG None  Radiology No results  found.  Procedures Procedures    Medications Ordered in ED Medications  oxyCODONE (Oxy IR/ROXICODONE) immediate release tablet 5 mg (5 mg Oral Given 05/24/23 2156)    ED Course/ Medical Decision Making/ A&P                             Medical Decision Making Amount and/or Complexity of Data Reviewed Labs: ordered.  Risk Prescription drug management.   Presents as outlined with a tender red foot.  Differential diagnosis includes injury\gout\osteoarthritis\cellulitis or deep space infection.  Will proceed with diagnostic lab work.  Patient had x-rays done on outpatient basis at urgent care will not repeat.  White blood cell count 4.2.  Lactic  acid 0.7 uric acid 8.4 GFR 51.  Sodium potassium normal.  Blood glucose 131.  At this time with normal white count, elevated uric acid normal lactic acid findings are most consistent with gout.  Patient is otherwise clinically well and less likely to have cellulitis or deep space infection.  Patient had concerns for taking anti-inflammatory medications due to her report of kidney disease.  She also had concerns for taking prednisone due to her diagnosis of diabetes although she does not actually take any medications and blood sugars at 131.  She reports multiple allergies including acetaminophen.  At this point she is willing to try oxycodone and elevating and follow-up with her doctor to discuss her options for management of gout in lieu of solely narcotic pain control which I explained will probably be less effective overall and pain control then either prednisone or an NSAID.  She voices understanding but is concerned about the prospect of taking prednisone or NSAIDs at this time.        Final Clinical Impression(s) / ED Diagnoses Final diagnoses:  Acute gout of right foot, unspecified cause    Rx / DC Orders ED Discharge Orders          Ordered    oxyCODONE (OXY IR/ROXICODONE) 5 MG immediate release tablet  Every 6 hours PRN         05/24/23 2155              Arby Barrette, MD 05/24/23 2202

## 2023-05-24 NOTE — ED Triage Notes (Signed)
Pt c/o R foot pain for 2x weeks. R foot has +2 edema, and some redness. Foot is warm and pink.

## 2023-05-25 LAB — C-REACTIVE PROTEIN: CRP: 1.8 mg/dL — ABNORMAL HIGH (ref <1.0)

## 2023-05-27 ENCOUNTER — Encounter: Payer: 59 | Admitting: Physical Medicine and Rehabilitation

## 2023-05-28 ENCOUNTER — Ambulatory Visit (INDEPENDENT_AMBULATORY_CARE_PROVIDER_SITE_OTHER): Payer: 59 | Admitting: Family Medicine

## 2023-05-28 ENCOUNTER — Encounter: Payer: Self-pay | Admitting: Family Medicine

## 2023-05-28 VITALS — BP 130/80 | HR 88 | Temp 98.4°F | Resp 18 | Ht 62.0 in | Wt 188.5 lb

## 2023-05-28 DIAGNOSIS — M10071 Idiopathic gout, right ankle and foot: Secondary | ICD-10-CM

## 2023-05-28 MED ORDER — PREDNISONE 20 MG PO TABS: 40.0000 mg | ORAL_TABLET | Freq: Every day | ORAL | 0 refills | Status: AC

## 2023-05-28 MED ORDER — COLCHICINE 0.6 MG PO TABS: ORAL_TABLET | ORAL | 1 refills | Status: DC

## 2023-05-28 NOTE — Patient Instructions (Signed)
Take the prednisone with food

## 2023-05-28 NOTE — Progress Notes (Signed)
Subjective:     Patient ID: [name], female    DOB: [birth year], 72 y.o.   MRN: [id number]  Chief Complaint  Patient presents with   Follow-up    ED Follow-up on 05/24/23 for gout of right foot    HPI  Seen in Emergency Department 6/7 for right foot pain.  Uric acid 8.4, white blood cell(s) 4.2, ESR 56. Was given oxycodone 5 mg for pain.  Can't take NSAID's due to CHRONIC KIDNEY DISEASE and prednisone-DM uncontrolled.  Mult sensitivities to medications.  Most recent creat 1.14.    Xray from 6/5 :IMPRESSION:  1.. Severe great toe metatarsophalangeal joint osteoarthritis. 2. Severe second through fourth tarsometatarsal joint osteoarthritis.  Still having pain but can at least walk only took 2 oxycodones due to fears of SE.    Still having problems w/mult areas cramps-left foot, hands-takes magnesium citrate.  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 name of MD    Dementia (HCC)    Diabetes mellitus    type 2    Diverticulosis    Dyslipidemia    Dyspnea    with exertion    Dysrhythmia    "skips beats"   Endometrial cancer (HCC) 2021   Family history of adverse reaction to anesthesia    son stopped breathing during surgery , mother slow to wake up    Fibromyalgia 1980's   Generalized anxiety disorder    GERD (gastroesophageal reflux disease)    occ- will use Tums and Prolosec  prn    Heart murmur    Hyperlipemia    Hypertension    not on blood pressure meds due to allergies - last dose of meds- months ago     Hypothyroidism    Hypothyroidism (acquired)    Internal hemorrhoids    Irritable bowel syndrome 1980's   Liver cancer (HCC)    Major depressive disorder    Migraine headaches 1980's   On meds-well controlled   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   Morbid obesity (HCC)    Pernicious anemia    Pneumonia 2009   Several times over the past several years   PONV (postoperative nausea and vomiting)    was told by neurologist due to calcificaton in brain not to be put to sleep, N/V during Op and Recovery    Recurrent upper respiratory infection (URI)    Sleep apnea    no cpap   Subarachnoid hemorrhage (HCC) 2010   Vaginal polyp    benign per pt    Past Surgical History:  Procedure Laterality Date   CATARACT EXTRACTION Right    CHOLECYSTECTOMY  1983   COLONOSCOPY  2008   DB   DILATION AND CURETTAGE OF UTERUS  1972   HYSTEROSCOPY WITH D & C  06/29/2011   Procedure: DILATATION AND CURETTAGE (D&C) /HYSTEROSCOPY;  Surgeon: Reva Bores, MD;  Location: [facility];  Service:  Gynecology;  Laterality: N/A;   ROBOTIC ASSISTED TOTAL HYSTERECTOMY WITH BILATERAL SALPINGO OOPHERECTOMY N/A 10/27/2020   Procedure: XI ROBOTIC ASSISTED TOTAL HYSTERECTOMY WITH BILATERAL SALPINGO OOPHORECTOMY;  Surgeon: Adolphus Birchwood, MD;  Location: [facility];  Service: Gynecology;  Laterality: N/A;   SENTINEL NODE BIOPSY N/A 10/27/2020   Procedure: SENTINEL NODE BIOPSY;  Surgeon: Adolphus Birchwood, MD;  Location: [facility];  Service: Gynecology;  Laterality: N/A;     Current Outpatient Medications:    Cholecalciferol (VITAMIN D3) 25 MCG (1000 UT) capsule, Take 1,000 Units by mouth daily., Disp: , Rfl:    colchicine 0.6 MG tablet, 1 tab by mouth daily, Disp: 10 tablet, Rfl: 1   cyanocobalamin (,VITAMIN B-12,) 1000 MCG/ML injection, Inject 1,000 mcg into the muscle every 30 (thirty) days. , Disp: , Rfl:    diclofenac Sodium (VOLTAREN) 1 % GEL, Apply 2 g topically 4 (four) times daily., Disp: 100 g, Rfl: 0   dicyclomine (BENTYL) 20  MG tablet, Take 1 tablet (20 mg total) by mouth every 8 (eight) hours as needed for spasms (AB pain)., Disp: 30 tablet, Rfl: 0   Evolocumab (REPATHA SURECLICK) 140 MG/ML SOAJ, INJECT 1 PEN SUBCUTANEOUSLY EVERY 14 DAYS (Patient taking differently: Inject 140 mg into the skin every 14 (fourteen) days.), Disp: 6 mL, Rfl: 3   glipiZIDE (GLUCOTROL) 5 MG tablet, Take 2.5 mg by mouth every morning., Disp: , Rfl:    glipiZIDE (GLUCOTROL) 5 MG tablet, Take 0.5 tablets (2.5 mg total) by mouth daily before breakfast., Disp: 45 tablet, Rfl: 1   glucose blood (ACCU-CHEK GUIDE) test strip, Use to test blood sugar 3 times daily, Disp: 100 each, Rfl: 2   levothyroxine (SYNTHROID) 50 MCG tablet, Take 1 tablet (50 mcg total) by mouth daily before breakfast., Disp: 90 tablet, Rfl: 3   MAGNESIUM CITRATE PO, Take 1 tablet by mouth 2 (two) times daily., Disp: , Rfl:    Multiple Vitamin (MULTIVITAMIN WITH MINERALS) TABS tablet, Take 1 tablet by mouth daily., Disp: 30 tablet, Rfl: 0   Na Sulfate-K Sulfate-Mg Sulf 17.5-3.13-1.6 GM/177ML SOLN, , Disp: , Rfl:    oxyCODONE (OXY IR/ROXICODONE) 5 MG immediate release tablet, Take 1 tablet (5 mg total) by mouth every 6 (six) hours as needed for moderate pain., Disp: 15 tablet, Rfl: 0   pantoprazole (PROTONIX) 20 MG tablet, Take 20 mg by mouth 2 (two) times daily. As needed., Disp: , Rfl:    potassium chloride SA (KLOR-CON M) 20 MEQ tablet, Take 1 tablet (20 mEq total) by mouth 2 (two) times daily., Disp: 180 tablet, Rfl: 1   predniSONE (DELTASONE) 20 MG tablet, Take 2 tablets (40 mg total) by mouth daily with breakfast for 5 days., Disp: 10 tablet, Rfl: 0   pregabalin (LYRICA) 50 MG capsule, Take 50 mg by mouth daily as needed (for pain)., Disp: , Rfl:    Probiotic Product (VSL#3 DS) PACK, Take 1 each by mouth daily., Disp: 20 each, Rfl: 0   SYSTANE HYDRATION PF 0.4-0.3 % SOLN, Place 1-2 drops into both eyes in the morning., Disp: , Rfl:    torsemide (DEMADEX) 20 MG tablet, Take 1  tablet (20 mg total) by mouth daily., Disp: 90 tablet, Rfl: 1   TRULANCE 3 MG TABS, Take 1 tablet by mouth daily., Disp: , Rfl:   Current Facility-Administered Medications:    cyanocobalamin (VITAMIN B12) injection 1,000 mcg, 1,000 mcg, Intramuscular, Q30 days, Jeani Sow, MD, 1,000 mcg at 04/05/23 1345  Allergies  Allergen Reactions   Atenolol  Other (See Comments)    Stomach problems, chills   Atorvastatin Other (See Comments)    Retained fluid   Canagliflozin Other (See Comments)    ALL DIABETIC MEDS PER PT   abdominal pain, headache   Losartan Potassium Other (See Comments)    chills, HA, stomach pain   Acarbose Nausea Only and Other (See Comments)     headache,nausea   Cinnamon Other (See Comments)    Stomach pain   Empagliflozin Other (See Comments)     headaches   Glimepiride Other (See Comments)    ALL DIABETIC MEDS PER PT  abdominal pain, headache   Metformin And Related Nausea And Vomiting and Other (See Comments)    Patient states that she has severe chills, headache and cramping additionally. Says blood sugar is uncontrolled because of this   Other Other (See Comments)    ALL DIABETIC MEDS PER PT    Pioglitazone Other (See Comments)    ALL DIABETIC MEDS PER PT = peripheral edema   Pravastatin Other (See Comments)    Retained fluid   Sitagliptin Other (See Comments)    ALL DIABETIC MEDS PER PT  Other reaction(s): headache   Spironolactone-Hctz Other (See Comments)   Statins Other (See Comments)    Myalgia   Sulfamethoxazole Other (See Comments)    ALL DIABETIC MEDS PER PT    Tramadol Hcl Itching   Atorvastatin Calcium Other (See Comments)     retain fld   Augmentin [Amoxicillin-Pot Clavulanate] Other (See Comments)    Caused jaundice   Crestor [Rosuvastatin] Other (See Comments)   Rosuvastatin Calcium    Bumex [Bumetanide] Rash and Other (See Comments)    Chills, rash, headaches, stomach issues also   Lasix [Furosemide] Rash and Other (See Comments)     Sugars increased   Sulfa Antibiotics Rash and Other (See Comments)    Mother has told patient in the past not to take, she cannot recall there reaction   ROS neg/noncontributory except as noted HPI/below      Objective:     BP 130/80   Pulse 88   Temp 98.4 F (36.9 C) (Temporal)   Resp 18   Ht 5\' 2"  (1.575 m)   Wt 188 lb 8 oz (85.5 kg)   SpO2 99%   BMI 34.48 kg/m  Wt Readings from Last 3 Encounters:  05/28/23 188 lb 8 oz (85.5 kg)  05/24/23 185 lb (83.9 kg)  05/07/23 189 lb 4 oz (85.8 kg)    Physical Exam   Gen: WDWN NAD HEENT: NCAT, conjunctiva not injected, sclera nonicteric EXT:  tr bilateral lower extremity.  1+ right foot Right foot-red, hot, tender, swollen lateral dorsum.  MSK: cane NEURO: A&O x3.  CN II-XII intact.  PSYCH: normal mood. Good eye contact  Reviewed ER notes     Assessment & Plan:  Acute idiopathic gout of right foot  Other orders -     predniSONE; Take 2 tablets (40 mg total) by mouth daily with breakfast for 5 days.  Dispense: 10 tablet; Refill: 0 -     Colchicine; 1 tab by mouth daily  Dispense: 10 tablet; Refill: 1   First episode of gout.  Right lateral foot.  Prednisone 40 mg daily and colchicine 0.6 mg daily.  Will just have to work on diet to prevent high sugars.  Patient needs relief.  A1C not too elevated.  Caution w/NSAID's-creatinine ok, but history of high.   Has appointment next week(s) so will decide on  next plan.    Return if symptoms worsen or fail to improve.  Angelena Sole, MD

## 2023-05-30 ENCOUNTER — Telehealth: Payer: Self-pay | Admitting: Family Medicine

## 2023-05-30 NOTE — Telephone Encounter (Signed)
Please see message below and advise.

## 2023-05-30 NOTE — Telephone Encounter (Signed)
Caller states they noticed patient may have diabetes and currently isn't on a statin per the ACC/AHA cholesterol guideline recommendations. Caller requests PCP consider re-evaluating therapy for patient and prescribing a statin if clinically appropriate. Caller can be reached at [phone].

## 2023-06-04 ENCOUNTER — Ambulatory Visit (INDEPENDENT_AMBULATORY_CARE_PROVIDER_SITE_OTHER): Payer: 59 | Admitting: Family Medicine

## 2023-06-04 ENCOUNTER — Encounter: Payer: Self-pay | Admitting: Family Medicine

## 2023-06-04 VITALS — BP 128/62 | HR 85 | Temp 99.1°F | Resp 18 | Ht 62.0 in | Wt 190.0 lb

## 2023-06-04 DIAGNOSIS — N1832 Chronic kidney disease, stage 3b: Secondary | ICD-10-CM | POA: Diagnosis not present

## 2023-06-04 DIAGNOSIS — F5101 Primary insomnia: Secondary | ICD-10-CM

## 2023-06-04 DIAGNOSIS — R600 Localized edema: Secondary | ICD-10-CM | POA: Diagnosis not present

## 2023-06-04 DIAGNOSIS — E1122 Type 2 diabetes mellitus with diabetic chronic kidney disease: Secondary | ICD-10-CM

## 2023-06-04 DIAGNOSIS — E538 Deficiency of other specified B group vitamins: Secondary | ICD-10-CM

## 2023-06-04 DIAGNOSIS — M10071 Idiopathic gout, right ankle and foot: Secondary | ICD-10-CM

## 2023-06-04 MED ORDER — MIRTAZAPINE 15 MG PO TABS
15.0000 mg | ORAL_TABLET | Freq: Every day | ORAL | 0 refills | Status: DC
Start: 2023-06-04 — End: 2023-10-02

## 2023-06-04 MED ORDER — CYANOCOBALAMIN 1000 MCG/ML IJ SOLN
1000.0000 ug | Freq: Once | INTRAMUSCULAR | Status: DC
Start: 2023-06-04 — End: 2023-06-04

## 2023-06-04 NOTE — Assessment & Plan Note (Signed)
Chronic.  Not ideal.  Intolerant of mult medications.  Needs to work on diet.

## 2023-06-04 NOTE — Progress Notes (Signed)
Subjective:     Patient ID: [name], female    DOB: [birth year], 72 y.o.   MRN: [id number]  Chief Complaint  Patient presents with   Follow-up    4 week follow-up     HPI DM-intolerant several medications.  Not taking glipizide.  Not working on diet.  Just finished prednisone Edema-intermitt torsemide-took yesterday and today.  Macular degeneration-retina-mid Sept.    B12 deficiency-getting monthly injections Gout-finished prednisone and better in right foot, now getting pain left foot.  Has been on feet a lot-cleaning-so making worse.   Insomnia-about 3 hours/night. Wonders if "imangining  things".  Pregabalin helps but not taking nightly.   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 name of MD    Dementia (HCC)    Diabetes mellitus    type 2    Diverticulosis    Dyslipidemia    Dyspnea    with exertion    Dysrhythmia    "skips beats"   Endometrial cancer (HCC) 2021   Family history of adverse reaction to anesthesia    son stopped breathing during surgery , mother slow to wake up    Fibromyalgia 1980's   Generalized anxiety disorder    GERD (gastroesophageal reflux disease)    occ- will use Tums and Prolosec  prn    Heart murmur    Hyperlipemia    Hypertension    not on blood pressure meds due to allergies - last dose of meds- months ago    Hypothyroidism    Hypothyroidism (acquired)    Internal hemorrhoids    Irritable bowel syndrome 1980's   Liver cancer (HCC)    Major depressive disorder     Migraine headaches 1980's   On meds-well controlled   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   Morbid obesity (HCC)    Pernicious anemia    Pneumonia 2009   Several times over the past several years   PONV (postoperative nausea and vomiting)    was told by neurologist due to calcificaton in brain not to be put to sleep, N/V during Op and Recovery    Recurrent upper respiratory infection (URI)    Sleep apnea    no cpap   Subarachnoid hemorrhage (HCC) 2010   Vaginal polyp    benign per pt    Past Surgical History:  Procedure Laterality Date   CATARACT EXTRACTION Right    CHOLECYSTECTOMY  1983   COLONOSCOPY  2008   DB   DILATION AND CURETTAGE OF UTERUS  1972   HYSTEROSCOPY WITH D & C  06/29/2011   Procedure: DILATATION AND CURETTAGE (D&C) /HYSTEROSCOPY;  Surgeon: Reva Bores, MD;  Location: [facility];  Service: Gynecology;  Laterality: N/A;   ROBOTIC ASSISTED TOTAL HYSTERECTOMY WITH BILATERAL SALPINGO OOPHERECTOMY N/A 10/27/2020   Procedure: XI ROBOTIC ASSISTED TOTAL HYSTERECTOMY WITH BILATERAL SALPINGO OOPHORECTOMY;  Surgeon: Adolphus Birchwood, MD;  Location: [facility];  Service: Gynecology;  Laterality: N/A;   SENTINEL NODE BIOPSY N/A 10/27/2020   Procedure: SENTINEL NODE BIOPSY;  Surgeon: Adolphus Birchwood, MD;  Location: [facility];  Service: Gynecology;  Laterality: N/A;     Current Outpatient Medications:    Cholecalciferol (VITAMIN D3) 25 MCG (1000 UT) capsule, Take 1,000 Units by mouth daily., Disp: , Rfl:    colchicine 0.6 MG tablet, 1 tab by mouth daily, Disp: 10 tablet, Rfl: 1   cyanocobalamin (,VITAMIN B-12,) 1000 MCG/ML injection, Inject 1,000 mcg into the muscle every 30 (thirty) days. , Disp: , Rfl:    diclofenac Sodium (VOLTAREN) 1 % GEL, Apply 2 g topically 4 (four) times daily., Disp: 100 g, Rfl: 0   dicyclomine (BENTYL) 20 MG tablet, Take 1 tablet (20 mg total) by mouth every 8 (eight) hours as needed for spasms (AB pain)., Disp: 30 tablet, Rfl: 0   Evolocumab (REPATHA SURECLICK)  140 MG/ML SOAJ, INJECT 1 PEN SUBCUTANEOUSLY EVERY 14 DAYS (Patient taking differently: Inject 140 mg into the skin every 14 (fourteen) days.), Disp: 6 mL, Rfl: 3   glucose blood (ACCU-CHEK GUIDE) test strip, Use to test blood sugar 3 times daily, Disp: 100 each, Rfl: 2   levothyroxine (SYNTHROID) 50 MCG tablet, Take 1 tablet (50 mcg total) by mouth daily before breakfast., Disp: 90 tablet, Rfl: 3   MAGNESIUM CITRATE PO, Take 1 tablet by mouth 2 (two) times daily., Disp: , Rfl:    mirtazapine (REMERON) 15 MG tablet, Take 1 tablet (15 mg total) by mouth at bedtime., Disp: 30 tablet, Rfl: 0   Multiple Vitamin (MULTIVITAMIN WITH MINERALS) TABS tablet, Take 1 tablet by mouth daily., Disp: 30 tablet, Rfl: 0   Na Sulfate-K Sulfate-Mg Sulf 17.5-3.13-1.6 GM/177ML SOLN, , Disp: , Rfl:    oxyCODONE (OXY IR/ROXICODONE) 5 MG immediate release tablet, Take 1 tablet (5 mg total) by mouth every 6 (six) hours as needed for moderate pain., Disp: 15 tablet, Rfl: 0   pantoprazole (PROTONIX) 20 MG tablet, Take 20 mg by mouth 2 (two) times daily. As needed., Disp: , Rfl:    potassium chloride SA (KLOR-CON M) 20 MEQ tablet, Take 1 tablet (20 mEq total) by mouth 2 (two) times daily., Disp: 180 tablet, Rfl: 1   pregabalin (LYRICA) 50 MG capsule, Take 50 mg by mouth daily as needed (for pain)., Disp: , Rfl:    Probiotic Product (VSL#3 DS) PACK, Take 1 each by mouth daily., Disp: 20 each, Rfl: 0   SYSTANE HYDRATION PF 0.4-0.3 % SOLN, Place 1-2 drops into both eyes in the morning., Disp: , Rfl:    torsemide (DEMADEX) 20 MG tablet, Take 1 tablet (20 mg total) by mouth daily., Disp: 90 tablet, Rfl: 1   TRULANCE 3 MG TABS, Take 1 tablet by mouth daily., Disp: , Rfl:   Current Facility-Administered Medications:    cyanocobalamin (VITAMIN B12) injection 1,000 mcg, 1,000 mcg, Intramuscular, Q30 days, Jeani Sow, MD, 1,000 mcg at 06/04/23 1354  Allergies  Allergen Reactions   Atenolol Other (See Comments)    Stomach  problems, chills   Atorvastatin Other (See Comments)    Retained fluid   Canagliflozin Other (See Comments)    ALL DIABETIC MEDS PER PT   abdominal pain, headache   Losartan Potassium Other (See Comments)    chills, HA, stomach pain   Acarbose Nausea Only and Other (See Comments)     headache,nausea   Cinnamon Other (See Comments)    Stomach pain   Empagliflozin Other (See  Comments)     headaches   Glimepiride Other (See Comments)    ALL DIABETIC MEDS PER PT  abdominal pain, headache   Metformin And Related Nausea And Vomiting and Other (See Comments)    Patient states that she has severe chills, headache and cramping additionally. Says blood sugar is uncontrolled because of this   Other Other (See Comments)    ALL DIABETIC MEDS PER PT    Pioglitazone Other (See Comments)    ALL DIABETIC MEDS PER PT = peripheral edema   Pravastatin Other (See Comments)    Retained fluid   Sitagliptin Other (See Comments)    ALL DIABETIC MEDS PER PT  Other reaction(s): headache   Spironolactone-Hctz Other (See Comments)   Statins Other (See Comments)    Myalgia   Sulfamethoxazole Other (See Comments)    ALL DIABETIC MEDS PER PT    Tramadol Hcl Itching   Atorvastatin Calcium Other (See Comments)     retain fld   Augmentin [Amoxicillin-Pot Clavulanate] Other (See Comments)    Caused jaundice   Crestor [Rosuvastatin] Other (See Comments)   Rosuvastatin Calcium    Bumex [Bumetanide] Rash and Other (See Comments)    Chills, rash, headaches, stomach issues also   Lasix [Furosemide] Rash and Other (See Comments)    Sugars increased   Sulfa Antibiotics Rash and Other (See Comments)    Mother has told patient in the past not to take, she cannot recall there reaction   ROS neg/noncontributory except as noted HPI/below      Objective:     BP 128/62 (BP Location: Left Arm, Patient Position: Sitting, Cuff Size: Large)   Pulse 85   Temp 99.1 F (37.3 C) (Temporal)   Resp 18   Ht 5\' 2"   (1.575 m)   Wt 190 lb (86.2 kg)   SpO2 99%   BMI 34.75 kg/m  Wt Readings from Last 3 Encounters:  06/04/23 190 lb (86.2 kg)  05/28/23 188 lb 8 oz (85.5 kg)  05/24/23 185 lb (83.9 kg)    Physical Exam   Gen: WDWN NAD HEENT: NCAT, conjunctiva not injected, sclera nonicteric NECK:  supple, no thyromegaly, no nodes, no carotid bruits CARDIAC: RRR, S1S2+, no murmur. DP 2+B LUNGS: CTAB. No wheezes ABDOMEN:  BS+, soft, NTND, No HSM, no masses EXT:  no edema.  Feet-bunions.  General tenderness.  Not red MSK: no gross abnormalities.  NEURO: A&O x3.  CN II-XII intact.  PSYCH: normal mood. Good eye contact     Assessment & Plan:  Type 2 diabetes mellitus with stage 3b chronic kidney disease, without long-term current use of insulin (HCC) Assessment & Plan: Chronic.  Not ideal.  Intolerant of mult medications.  Needs to work on diet.    B12 deficiency  Primary insomnia Assessment & Plan: Chronic.  Not well controlled.  Lyrica helps but causes dizziness(patient already w/balance issues), will trial mirtazipine 15 mg nightly   Local edema Assessment & Plan: Chronic.  Mult drug intolerances.  Use torsemide 20 mg daily as needed.  Controlled right now   Acute idiopathic gout of right foot  Other orders -     Mirtazapine; Take 1 tablet (15 mg total) by mouth at bedtime.  Dispense: 30 tablet; Refill: 0  B12 deficiency-getting monthly injections.  Chronic.  Stable Gout-resolved.    Return in about 4 weeks (around 07/02/2023) for chronic follow-up.  Angelena Sole, MD

## 2023-06-04 NOTE — Assessment & Plan Note (Signed)
Chronic.  Not well controlled.  Lyrica helps but causes dizziness(patient already w/balance issues), will trial mirtazipine 15 mg nightly

## 2023-06-04 NOTE — Assessment & Plan Note (Signed)
Chronic.  Mult drug intolerances.  Use torsemide 20 mg daily as needed.  Controlled right now

## 2023-06-04 NOTE — Patient Instructions (Signed)

## 2023-06-06 ENCOUNTER — Other Ambulatory Visit: Payer: Self-pay | Admitting: Family Medicine

## 2023-06-06 DIAGNOSIS — I25709 Atherosclerosis of coronary artery bypass graft(s), unspecified, with unspecified angina pectoris: Secondary | ICD-10-CM

## 2023-06-17 ENCOUNTER — Encounter: Payer: 59 | Attending: Physical Medicine and Rehabilitation | Admitting: Physical Medicine and Rehabilitation

## 2023-06-17 VITALS — BP 158/87 | HR 86 | Ht 62.0 in | Wt 192.8 lb

## 2023-06-17 DIAGNOSIS — R262 Difficulty in walking, not elsewhere classified: Secondary | ICD-10-CM | POA: Diagnosis not present

## 2023-06-17 DIAGNOSIS — T7840XS Allergy, unspecified, sequela: Secondary | ICD-10-CM

## 2023-06-17 DIAGNOSIS — M109 Gout, unspecified: Secondary | ICD-10-CM | POA: Diagnosis not present

## 2023-06-17 DIAGNOSIS — R7401 Elevation of levels of liver transaminase levels: Secondary | ICD-10-CM | POA: Diagnosis not present

## 2023-06-17 DIAGNOSIS — H353122 Nonexudative age-related macular degeneration, left eye, intermediate dry stage: Secondary | ICD-10-CM | POA: Diagnosis not present

## 2023-06-17 DIAGNOSIS — N189 Chronic kidney disease, unspecified: Secondary | ICD-10-CM | POA: Diagnosis not present

## 2023-06-17 MED ORDER — PREGABALIN 50 MG PO CAPS: 50.0000 mg | ORAL_CAPSULE | Freq: Every day | ORAL | 3 refills | Status: DC | PRN

## 2023-06-17 MED ORDER — TRAZODONE HCL 50 MG PO TABS: 50.0000 mg | ORAL_TABLET | Freq: Every day | ORAL | 3 refills | Status: DC

## 2023-06-17 NOTE — Progress Notes (Deleted)
Subjective:    Patient ID: [name], female    DOB: [birth year], 72 y.o.   MRN: [id number]  HPI: An audio/video tele-health visit is felt to be the most appropriate encounter for this patient at this time. This is a follow up tele-visit via phone. The patient is at home. MD is at office. Prior to scheduling this appointment, our staff discussed the limitations of evaluation and management by telemedicine and the availability of in-person appointments. The patient expressed understanding and agreed to proceed.    [name] is a 72 y.o. female who returns for f/u appointment of her T7 Vertebral Fracture, Essential Hypertension, Neuropathic Pain and Dizziness.  She presented to [name] on 01/30/2022 with complaints of pain after a fall, she was transferred to [name].   Dr [name]: H&P HPI: [name] is a 72 y.o. female with medical history significant of DM, HTN, dementia, and pernicious anemia presenting with a fall.  She has been sick for a long time.  She went off amitriptyline for migraines and was 50% less dizzy and so wasn't falling TID.  She took some brain herbal for 5 days and it started making her dizzy (less than amitriptyline).  She came into the house and tripped.   She tried to break her fall with her hand.  Both hands are hurting severely.  She hasn't been able to sleep off the amitriptyline.  She is hurting in her shoulders.  She saw a neurosurgeon 8 months ago and "a compressed something" and there was a question about FARS.  She has Parkinson's-type symptoms.  She is having tremors at baseline.  She thinks she is not a candidate for surgery - elderly, anesthesia complications, calcifications in her brain.  She has decided she wants to see someone in [place].  She just found out that she has macular degeneration She falls from dizziness She has had dizziness for a long time.  She has severe calcifications in the brain She has a lot of  allergies to medications and dizziness.    CT Head: WO Contrast: CT Cervical Spine IMPRESSION: 1. No acute intracranial abnormality. 2. Small right frontal scalp hematoma without skull fracture. 3. No acute fracture or static subluxation of the cervical spine.  CT Thoracic Spine IMPRESSION: 1. No acute fracture or static subluxation of the thoracic spine. 2. Chronic compression deformity of L1.   Aortic Atherosclerosis (ICD10-I70.0).    MR Cervical Spine IMPRESSION: 1. Mild T7 compression fracture with a small amount of bone marrow edema, compatible with acute to subacute injury. Minimal height loss. 2. Multilevel degenerative disc disease of the cervical spine with moderate C5-6 and mild C4-5 spinal canal stenosis. 3. Severe bilateral neural foraminal stenosis at C5-6.   MR Thoracic Spine:  IMPRESSION: 1. Mild T7 compression fracture with a small amount of bone marrow edema, compatible with acute to subacute injury. Minimal height loss. 2. Multilevel degenerative disc disease of the cervical spine with moderate C5-6 and mild C4-5 spinal canal stenosis. 3. Severe bilateral neural foraminal stenosis at C5-6.   DG: Left Hand   IMPRESSION: 1. Widening of the Scapholunate interval compatible with acute or chronic ligamentous injury which can predispose to SLAC wrist. 2. No acute fracture or dislocation identified about the left hand. 3. 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 4. Calcified peripheral vascular disease.  [name] was admitted to inpatient rehabilitation on 02/07/2022 and discharged home on 02/01/24/2022. She will be attending  outpatient therapy.  She states she has pain in her neck radiating into her bilateral shoulders, left arm with tingling and lower back pain. She is walking with her walker.    She is walking 4,000 steps per day -she stopped the amitriptyline for migraines- no one told her to. This improved the dizziness by 50%. -she has  had falls -does not not fele the room is spinning -she feels dizzy and unbalanced.  -she uses her cane -usually does not use the walker -insomnia has improved  1) Constipation -had constant diarrhea and took magneisum citrate and now nothing is working -the gastro PA put her on Linzess, it caused more diarrhea for a few days and it now does nothing.  -she tried the lactulose and this resulted in 2 small BPM -she had an XR in January and this did not show cancer recurrence -she does not think she can insert and enema herself  2) Prolapsed vagina -has had a hysterectomy due to cancer and it was done by a robot, she had no pain from the procedures -she had a bad vaginal infection afterwards -she has a urology appointment tomorrow.    Pain Inventory Average Pain 5 Pain Right Now 5 My pain is intermittent, burning, dull, and tingling  In the last 24 hours, has pain interfered with the following? General activity 0 Relation with others 7 Enjoyment of life 8 What TIME of day is your pain at its worst? morning , daytime, evening, night, and varies Sleep (in general) Poor  Pain is worse with: unsure Pain improves with:  nothing Relief from Meds none  Family History  Problem Relation Age of Onset   Anesthesia problems Mother        hard to wake post op    Cerebrovascular Disease Mother        Never had stroke, but had CEA.   Liver cancer Father        mets to liver    Bladder Cancer Father    Colon cancer Father        mets to colon    Schizophrenia Daughter        Schizoaffective disorder   Anesthesia problems Son        stopped breathing post op    Tremor Son    Anesthesia problems Granddaughter        PONV   High blood pressure Other    Diabetes Other    Arthritis Other    Schizophrenia Child    Tremor Child    Dementia Neg Hx    Esophageal cancer Neg Hx    Rectal cancer Neg Hx    Stomach cancer Neg Hx    Colon polyps Neg Hx    Social History    Socioeconomic History   Marital status: Legally Separated    Spouse name: Not on file   Number of children: 3   Years of education: 12   Highest education level: Not on file  Occupational History   Occupation: Retired     Comment: nanny   Occupation: retired  Tobacco Use   Smoking status: Never   Smokeless tobacco: Never  Building services engineer Use: Never used  Substance and Sexual Activity   Alcohol use: Not Currently    Comment: quit 1978   Drug use: Never   Sexual activity: Not Currently  Other Topics Concern   Not on file  Social History Narrative    Lives at home. Her granddaughter lives  with her and daughter    Right handed   Social Determinants of Health   Financial Resource Strain: Not on file  Food Insecurity: Not on file  Transportation Needs: Not on file  Physical Activity: Not on file  Stress: Not on file  Social Connections: Not on file   Past Surgical History:  Procedure Laterality Date   CATARACT EXTRACTION Right    CHOLECYSTECTOMY  1983   COLONOSCOPY  2008   DB   DILATION AND CURETTAGE OF UTERUS  1972   HYSTEROSCOPY WITH D & C  06/29/2011   Procedure: DILATATION AND CURETTAGE (D&C) /HYSTEROSCOPY;  Surgeon: Reva Bores, MD;  Location: [facility];  Service: Gynecology;  Laterality: N/A;   ROBOTIC ASSISTED TOTAL HYSTERECTOMY WITH BILATERAL SALPINGO OOPHERECTOMY N/A 10/27/2020   Procedure: XI ROBOTIC ASSISTED TOTAL HYSTERECTOMY WITH BILATERAL SALPINGO OOPHORECTOMY;  Surgeon: Adolphus Birchwood, MD;  Location: [facility];  Service: Gynecology;  Laterality: N/A;   SENTINEL NODE BIOPSY N/A 10/27/2020   Procedure: SENTINEL NODE BIOPSY;  Surgeon: Adolphus Birchwood, MD;  Location: [facility];  Service: Gynecology;  Laterality: N/A;   Past Medical History:  Diagnosis Date   Allergy    Anemia    Arthritis    Asthma    Dianosed years ago-no meds at this time   Cancer Surgery [name]) 2021   endometrial    Cataract    Right eye removed-still has left cataract    Chronic kidney disease  1997   Nepthrotic syndrom with minimal change-in remission per pt - last seen by kidney MD- 2 mos ago ? name of MD    Dementia (HCC)    Diabetes mellitus    type 2    Diverticulosis    Dyslipidemia    Dyspnea    with exertion    Dysrhythmia    "skips beats"   Endometrial cancer (HCC) 2021   Family history of adverse reaction to anesthesia    son stopped breathing during surgery , mother slow to wake up    Fibromyalgia 1980's   Generalized anxiety disorder    GERD (gastroesophageal reflux disease)    occ- will use Tums and Prolosec  prn    Heart murmur    Hyperlipemia    Hypertension    not on blood pressure meds due to allergies - last dose of meds- months ago    Hypothyroidism    Hypothyroidism (acquired)    Internal hemorrhoids    Irritable bowel syndrome 1980's   Liver cancer (HCC)    Major depressive disorder    Migraine headaches 1980's   On meds-well controlled   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  Opioid Risk Score:   Fall Risk Score:  `1  Depression screen PHQ 2/9     04/05/2023    1:08 PM 02/12/2023   12:44 PM 10/22/2022    1:49 PM 07/24/2022    1:30 PM 03/05/2022    1:33 PM 09/21/2020  8:23 AM 09/01/2020    8:47 AM  Depression screen PHQ 2/9  Decreased Interest 0 1 1 0 1 1 0  Down, Depressed, Hopeless 1 1 1  0 0 1 0  PHQ - 2 Score 1 2 2  0 1 2 0  Altered sleeping 2    3 1  0  Tired, decreased energy 2    2 1  0  Change in appetite 0    0 0 0  Feeling bad or failure about yourself  1    0 0 0  Trouble  concentrating 0    0 0 0  Moving slowly or fidgety/restless 0    0 0 0  Suicidal thoughts 0    0 0 0  PHQ-9 Score 6    6 4  0  Difficult doing work/chores Somewhat difficult    Not difficult at all       Review of Systems  Constitutional:  Positive for chills.  HENT: Negative.    Eyes: Negative.   Respiratory: Negative.    Cardiovascular: Negative.   Gastrointestinal:  Positive for diarrhea.  Endocrine: Negative.   Genitourinary:  Positive for urgency.  Musculoskeletal:  Positive for arthralgias, back pain, gait problem and neck pain.  Skin: Negative.   Allergic/Immunologic: Negative.   Neurological:  Positive for dizziness, tremors, weakness and numbness.  Hematological: Negative.   Psychiatric/Behavioral:  The patient is nervous/anxious.   All other systems reviewed and are negative.      Objective:   Physical Exam Vitals and nursing note reviewed. BMI 34.75, weight 190 lbs, BP 160/81 Constitutional:      Appearance: Normal appearance.  Cardiovascular:     Rate and Rhythm: Normal rate and regular rhythm.     Pulses: Normal pulses.     Heart sounds: Normal heart sounds.  Pulmonary:     Effort: Pulmonary effort is normal.     Breath sounds: Normal breath sounds.  Musculoskeletal:     Cervical back: Normal range of motion and neck supple.     Comments: Normal Muscle Bulk and Muscle Testing Reveals:  Upper Extremities: Full ROM and Muscle Strength 5/5 Lower Extremities: Full ROM and Muscle Strength 5/5 Arises from Table Slowly using walker for support Narrow Based  Gait  Ambulating with gait    Skin:    General: Skin is warm and dry.  Neurological:     General: No focal deficit present.     Mental Status: She is alert and oriented to person, place, and time.  Psychiatric:        Mood and Affect: Mood normal.        Behavior: Behavior normal. Verbose       Assessment & Plan:   1. T7 Vertebral Fracture: F/U with Dr. Conchita Paris. Continue outpatient therapy.  2.   Essential Hypertension,: Continue current medication regimen. PCP Following.   3.  Neuropathic Pain: Continue current medication regimen. Continue to monitor  4. Dizziness. PCP following. Continue to monitor. Continue to Monitor. Commended on stopping amitriptyline and gabapentin. Encouraged continued hydration.  -discussed vestibular eval -discussed that her brain calcifications could contribute Discussed extracorporeal shockwave therapy as a modality for treatment. Discussed that the device looks and feels like a massage gun and I would move it over the area of pain for about 10 minutes. The device releases sound waves to the area of pain and helps to improve blood flow and circulation to improve the healing process. Discuss that this initially induces inflammation and can sometimes cause  short-term increase in pain. Discussed that we typically do three weekly treatments, but sometimes up to 6 if needed, and after 6 weeks long term benefits can sometimes be achieved. Discussed that this is an FDA approved device, but not covered by insurance and would cost $60 per session. Will scheduled patient for 6 consecutive appointments and can cancel latter three if benefits are achieved after first three sessions.    5. Diarrhea: discussed how this had complicated her PT  6. Migraines: discussed her prior use of amitriptyline and gabapentin and how she stopped these due to her dizziness. Continue magnesium supplement -discussed Botox, Nurtec  7. Constipation:  -continue magnesium supplement.  -discussed that response to lactulose was not complete, she only had 2 small BM with this, recommended going to urgent care/ER as she may need disimpaction/enema -discussed that she had a tiny BM three days ago  -discussed that magnesium citrate was not beneficial -discussed that suppository was not effective -discussed lactulose, ordered -Provided list of following foods that help with constipation and  highlighted a few: 1) prunes- contain high amounts of fiber.  2) apples- has a form of dietary fiber called pectin that accelerates stool movement and increases beneficial gut bacteria 3) pears- in addition to fiber, also high in fructose and sorbitol which have laxative effect 4) figs- contain an enzyme ficin which helps to speed colonic transit 5) kiwis- contain an enzyme actinidin that improves gut motility and reduces constipation 6) oranges- rich in pectin (like apples) 7) grapefruits- contain a flavanol naringenin which has a laxative effect 8) vegetables- rich in fiber and also great sources of folate, vitamin C, and K 9) artichoke- high in inulin, prebiotic great for the microbiome 10) chicory- increases stool frequency and softness (can be added to coffee) 11) rhubarb- laxative effect 12) sweet potato- high fiber 13) beans, peas, and lentils- contain both soluble and insoluble fiber 14) chia seeds- improves intestinal health and gut flora 15) flaxseeds- laxative effect 16) whole grain rye bread- high in fiber 17) oat bran- high in soluble and insoluble fiber 18) kefir- softens stools -recommended to try at least one of these foods every day.  -drink 6-8 glasses of water per day -walk regularly, especially after meals.   8) Insomnia: -Try to go outside near sunrise -Get exercise during the day. Discussed that her family doctor does not want her to use the treadmill due to fall risk.  -Turn off all devices an hour before bedtime.  -Teas that can benefit: chamomile, valerian root, Brahmi (Bacopa) -Can consider over the counter melatonin, magnesium, and/or L-theanine. Melatonin is an anti-oxidant with multiple health benefits. Magnesium is involved in greater than 300 enzymatic reactions in the body and most of [name] are deficient as our soil is often depleted. There are 7 different types of magnesium- Bioptemizer's is a supplement with all 7 types, and each has unique benefits.  Magnesium can also help with constipation and anxiety.  -Pistachios naturally increase the production of melatonin -Cozy Earth bamboo bed sheets are free from toxic chemicals.  -Tart cherry juice or a tart cherry supplement can improve sleep and soreness post-workout  9) HLD -continue Repatha   10) Subacute confusion: -ammonia level ordered -discussed her history of fatty liver disease -lactulose ordered  11) Prolapsed vaginal wall: -follow-up with urology tomorrow  12) Stress -discussed family stressors.   13) History of diarrhea: -discussed that it got in the way of her therapy and she was taking 3-4 showers per day -discussed that  she can try imodium daily prn if diarrhea resumes.   9 minutes spent in discussion that her response to Lactulose was not complete and that she may need to go to to urgent care/ED for dismpaction/enema

## 2023-06-17 NOTE — Patient Instructions (Signed)
Allopurinol  

## 2023-06-17 NOTE — Progress Notes (Addendum)
Subjective:    Patient ID: [name], female    DOB: January 23, 1951, 72 y.o.   MRN: [id number]  HPI: [name] is a 72 y.o. female who returns for f/u appointment of her T7 Vertebral Fracture, Essential Hypertension, Neuropathic Pain and Dizziness.  She presented to [facility] on 01/30/2022 with complaints of pain after a fall, she was transferred to [facility].   Dr Lorin Mercy: H&P HPI: [name] is a 72 y.o. female with medical history significant of DM, HTN, dementia, and pernicious anemia presenting with a fall.  She has been sick for a long time.  She went off amitriptyline for migraines and was 50% less dizzy and so wasn't falling TID.  She took some brain herbal for 5 days and it started making her dizzy (less than amitriptyline).  She came into the house and tripped.   She tried to break her fall with her hand.  Both hands are hurting severely.  She hasn't been able to sleep off the amitriptyline.  She is hurting in her shoulders.  She saw a neurosurgeon 8 months ago and "a compressed something" and there was a question about FARS.  She has Parkinson's-type symptoms.  She is having tremors at baseline.  She thinks she is not a candidate for surgery - elderly, anesthesia complications, calcifications in her brain.  She has decided she wants to see someone in [place].  She just found out that she has macular degeneration She falls from dizziness She has had dizziness for a long time.  She has severe calcifications in the brain She has a lot of allergies to medications and dizziness.    CT Head: WO Contrast: CT Cervical Spine IMPRESSION: 1. No acute intracranial abnormality. 2. Small right frontal scalp hematoma without skull fracture. 3. No acute fracture or static subluxation of the cervical spine.  CT Thoracic Spine IMPRESSION: 1. No acute fracture or static subluxation of the thoracic spine. 2. Chronic compression deformity of L1.   Aortic  Atherosclerosis (ICD10-I70.0).    MR Cervical Spine IMPRESSION: 1. Mild T7 compression fracture with a small amount of bone marrow edema, compatible with acute to subacute injury. Minimal height loss. 2. Multilevel degenerative disc disease of the cervical spine with moderate C5-6 and mild C4-5 spinal canal stenosis. 3. Severe bilateral neural foraminal stenosis at C5-6.   MR Thoracic Spine:  IMPRESSION: 1. Mild T7 compression fracture with a small amount of bone marrow edema, compatible with acute to subacute injury. Minimal height loss. 2. Multilevel degenerative disc disease of the cervical spine with moderate C5-6 and mild C4-5 spinal canal stenosis. 3. Severe bilateral neural foraminal stenosis at C5-6.   DG: Left Hand   IMPRESSION: 1. Widening of the Scapholunate interval compatible with acute or chronic ligamentous injury which can predispose to SLAC wrist. 2. No acute fracture or dislocation identified about the left hand. 3. 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 4. Calcified peripheral vascular disease.  [name] was admitted to inpatient rehabilitation on 02/07/2022 and discharged home on 02/01/24/2022. She will be attending outpatient therapy.  She states she has pain in her neck radiating into her bilateral shoulders, left arm with tingling and lower back pain. She is walking with her walker.    She is walking 4,000 steps per day -she stopped the amitriptyline for migraines- no one told her to. This improved the dizziness by 50%. -she has had falls -does not not fele the room is  spinning -she feels dizzy and unbalanced.  -she uses her cane -usually does not use the walker -insomnia has improved  1) Constipation -had constant diarrhea and took magneisum citrate and now nothing is working -the gastro PA put her on Linzess, it caused more diarrhea for a few days and it now does nothing.   2) Prolapsed vagina -has had a hysterectomy due to  cancer and it was done by a robot, she had no pain from the procedures -she had a bad vaginal infection afterwards -she has a urology appointment tomorrow.   3) Allergy: -had severe allergy to Augmentin which she was started on for a diverticular infection  4) Transaminitis: -in response to augmentin    Pain Inventory Average Pain 5 Pain Right Now 4 My pain is intermittent, burning, dull, and tingling sharp stabbing aching  In the last 24 hours, has pain interfered with the following? General activity 5 Relation with others 0 Enjoyment of life 4 What TIME of day is your pain at its worst? morning , daytime, evening, night, and varies Sleep (in general) Poor  Pain is worse with: walking some activities Pain improves with:  nothing Relief from Meds none  Family History  Problem Relation Age of Onset   Anesthesia problems Mother        hard to wake post op    Cerebrovascular Disease Mother        Never had stroke, but had CEA.   Liver cancer Father        mets to liver    Bladder Cancer Father    Colon cancer Father        mets to colon    Schizophrenia Daughter        Schizoaffective disorder   Anesthesia problems Son        stopped breathing post op    Tremor Son    Anesthesia problems Granddaughter        PONV   High blood pressure Other    Diabetes Other    Arthritis Other    Schizophrenia Child    Tremor Child    Dementia Neg Hx    Esophageal cancer Neg Hx    Rectal cancer Neg Hx    Stomach cancer Neg Hx    Colon polyps Neg Hx    Social History   Socioeconomic History   Marital status: Legally Separated    Spouse name: Not on file   Number of children: 3   Years of education: 12   Highest education level: Not on file  Occupational History   Occupation: Retired     Comment: nanny   Occupation: retired  Tobacco Use   Smoking status: Never   Smokeless tobacco: Never  Building services engineer Use: Never used  Substance and Sexual Activity   Alcohol  use: Not Currently    Comment: quit 1978   Drug use: Never   Sexual activity: Not Currently  Other Topics Concern   Not on file  Social History Narrative    Lives at home. Her granddaughter lives with her and daughter    Right handed   Social Determinants of Health   Financial Resource Strain: Not on file  Food Insecurity: Not on file  Transportation Needs: Not on file  Physical Activity: Not on file  Stress: Not on file  Social Connections: Not on file   Past Surgical History:  Procedure Laterality Date   CATARACT EXTRACTION Right    CHOLECYSTECTOMY  [phone]  COLONOSCOPY  2008   DB   DILATION AND CURETTAGE OF UTERUS  1972   HYSTEROSCOPY WITH D & C  06/29/2011   Procedure: DILATATION AND CURETTAGE (D&C) /HYSTEROSCOPY;  Surgeon: Reva Bores, MD;  Location: [facility];  Service: Gynecology;  Laterality: N/A;   ROBOTIC ASSISTED TOTAL HYSTERECTOMY WITH BILATERAL SALPINGO OOPHERECTOMY N/A 10/27/2020   Procedure: XI ROBOTIC ASSISTED TOTAL HYSTERECTOMY WITH BILATERAL SALPINGO OOPHORECTOMY;  Surgeon: Adolphus Birchwood, MD;  Location: [facility];  Service: Gynecology;  Laterality: N/A;   SENTINEL NODE BIOPSY N/A 10/27/2020   Procedure: SENTINEL NODE BIOPSY;  Surgeon: Adolphus Birchwood, MD;  Location: [facility];  Service: Gynecology;  Laterality: N/A;   Past Medical History:  Diagnosis Date   Allergy    Anemia    Arthritis    Asthma    Dianosed years ago-no meds at this time   Cancer [facility]) 2021   endometrial    Cataract    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 name of MD    Dementia (HCC)    Diabetes mellitus    type 2    Diverticulosis    Dyslipidemia    Dyspnea    with exertion    Dysrhythmia    "skips beats"   Endometrial cancer (HCC) 2021   Family history of adverse reaction to anesthesia    son stopped breathing during surgery , mother slow to wake up     Fibromyalgia 1980's   Generalized anxiety disorder    GERD (gastroesophageal reflux disease)    occ- will use Tums and Prolosec  prn    Heart murmur    Hyperlipemia    Hypertension    not on blood pressure meds due to allergies - last dose of meds- months ago    Hypothyroidism    Hypothyroidism (acquired)    Internal hemorrhoids    Irritable bowel syndrome 1980's   Liver cancer (HCC)    Major depressive disorder    Migraine headaches 1980's   On meds-well controlled   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   Morbid obesity (HCC)    Pernicious anemia    Pneumonia 2009   Several times over the past several years   PONV (postoperative nausea and vomiting)    was told by neurologist due to calcificaton in brain not to be put to sleep, N/V during Op and Recovery    Recurrent upper respiratory infection (URI)    Sleep apnea    no cpap   Subarachnoid hemorrhage (HCC) 2010   Vaginal polyp    benign per pt   Ht 5\' 2"  (1.575 m)   Wt 192 lb 12.8 oz (87.5 kg)   BMI 35.26 kg/m   Opioid Risk Score:   Fall Risk Score:  `1  Depression screen PHQ 2/9     04/05/2023    1:08 PM 02/12/2023   12:44 PM 10/22/2022    1:49 PM 07/24/2022    1:30 PM 03/05/2022    1:33 PM 09/21/2020    8:23 AM 09/01/2020    8:47 AM  Depression screen PHQ 2/9  Decreased Interest 0 1 1 0 1 1 0  Down, Depressed, Hopeless 1 1 1  0 0 1 0  PHQ - 2 Score 1 2 2  0 1 2 0  Altered sleeping 2    3 1  0  Tired, decreased  energy 2    2 1  0  Change in appetite 0    0 0 0  Feeling bad or failure about yourself  1    0 0 0  Trouble concentrating 0    0 0 0  Moving slowly or fidgety/restless 0    0 0 0  Suicidal thoughts 0    0 0 0  PHQ-9 Score 6    6 4  0  Difficult doing work/chores Somewhat difficult    Not difficult at all       Review of Systems  Constitutional:  Positive for chills.  HENT: Negative.    Eyes: Negative.   Respiratory: Negative.    Cardiovascular: Negative.   Gastrointestinal:  Positive for  diarrhea.  Endocrine: Negative.   Genitourinary:  Positive for urgency.  Musculoskeletal:  Positive for arthralgias, back pain, gait problem and neck pain.  Skin: Negative.   Allergic/Immunologic: Negative.   Neurological:  Positive for dizziness, tremors, weakness and numbness.  Hematological: Negative.   Psychiatric/Behavioral:  The patient is nervous/anxious.        Objective:   Physical Exam Vitals and nursing note reviewed. BMI 35.26, weight 192 lbs, BP 158/87 Gen: no distress, normal appearing HEENT: oral mucosa pink and moist, NCAT Cardio: Reg rate Chest: normal effort, normal rate of breathing Abd: soft, non-distended Ext: no edema Psych: pleasant, normal affect Skin: intact    Breath sounds: Normal breath sounds.  Musculoskeletal:     Cervical back: Normal range of motion and neck supple.     Comments: Normal Muscle Bulk and Muscle Testing Reveals:  Upper Extremities: Full ROM and Muscle Strength 5/5 Lower Extremities: Full ROM and Muscle Strength 5/5 Arises from Table Slowly using walker for support Narrow Based  Gait  Ambulating with gait    Skin:    General: Skin is warm and dry.  Neurological:     General: No focal deficit present.     Mental Status: She is alert and oriented to person, place, and time.  Psychiatric:        Mood and Affect: Mood normal.        Behavior: Behavior normal. Verbose       Assessment & Plan:   1. T7 Vertebral Fracture: F/U with Dr. Conchita Paris. Continue outpatient therapy.  2.  Essential Hypertension,: Continue current medication regimen. PCP Following.   3.  Neuropathic Pain: Continue current medication regimen. Continue to monitor  4. Dizziness. PCP following. Continue to monitor. Continue to Monitor. Commended on stopping amitriptyline and gabapentin. Encouraged continued hydration.  -discussed vestibular eval -discussed that her brain calcifications could contribute Discussed extracorporeal shockwave therapy as a  modality for treatment. Discussed that the device looks and feels like a massage gun and I would move it over the area of pain for about 10 minutes. The device releases sound waves to the area of pain and helps to improve blood flow and circulation to improve the healing process. Discuss that this initially induces inflammation and can sometimes cause short-term increase in pain. Discussed that we typically do three weekly treatments, but sometimes up to 6 if needed, and after 6 weeks long term benefits can sometimes be achieved. Discussed that this is an FDA approved device, but not covered by insurance and would cost $60 per session. Will scheduled patient for 6 consecutive appointments and can cancel latter three if benefits are achieved after first three sessions.    5. Diarrhea: discussed how this had complicated her PT  6.  Migraines: discussed her prior use of amitriptyline and gabapentin and how she stopped these due to her dizziness. Continue magnesium supplement -discussed Botox, Nurtec  7. Constipation:  -continue magnesium supplement.  -discussed that she had a tiny BM three days ago  -discussed that magnesium citrate was not beneficial -discussed that suppository was not effective -discussed lactulose, ordered -Provided list of following foods that help with constipation and highlighted a few: 1) prunes- contain high amounts of fiber.  2) apples- has a form of dietary fiber called pectin that accelerates stool movement and increases beneficial gut bacteria 3) pears- in addition to fiber, also high in fructose and sorbitol which have laxative effect 4) figs- contain an enzyme ficin which helps to speed colonic transit 5) kiwis- contain an enzyme actinidin that improves gut motility and reduces constipation 6) oranges- rich in pectin (like apples) 7) grapefruits- contain a flavanol naringenin which has a laxative effect 8) vegetables- rich in fiber and also great sources of folate,  vitamin C, and K 9) artichoke- high in inulin, prebiotic great for the microbiome 10) chicory- increases stool frequency and softness (can be added to coffee) 11) rhubarb- laxative effect 12) sweet potato- high fiber 13) beans, peas, and lentils- contain both soluble and insoluble fiber 14) chia seeds- improves intestinal health and gut flora 15) flaxseeds- laxative effect 16) whole grain rye bread- high in fiber 17) oat bran- high in soluble and insoluble fiber 18) kefir- softens stools -recommended to try at least one of these foods every day.  -drink 6-8 glasses of water per day -walk regularly, especially after meals.   8) Insomnia: -Try to go outside near sunrise -Get exercise during the day. Discussed that her family doctor does not want her to use the treadmill due to fall risk.  -Turn off all devices an hour before bedtime.  -Teas that can benefit: chamomile, valerian root, Brahmi (Bacopa) -Can consider over the counter melatonin, magnesium, and/or L-theanine. Melatonin is an anti-oxidant with multiple health benefits. Magnesium is involved in greater than 300 enzymatic reactions in the body and most of [name] are deficient as our soil is often depleted. There are 7 different types of magnesium- Bioptemizer's is a supplement with all 7 types, and each has unique benefits. Magnesium can also help with constipation and anxiety.  -Pistachios naturally increase the production of melatonin -Cozy Earth bamboo bed sheets are free from toxic chemicals.  -Tart cherry juice or a tart cherry supplement can improve sleep and soreness post-workout  9) HLD -continue Repatha   10) Subacute confusion: -ammonia level ordered -discussed her history of fatty liver disease -lactulose ordered  11) Prolapsed vaginal wall: -follow-up with urology tomorrow  12) Stress -discussed family stressors.   13) History of diarrhea: -discussed that it got in the way of her therapy and she was taking 3-4  showers per day -discussed that she can try imodium daily prn if diarrhea resumes.   14. Transaminitis: -discussed that this was in response to Augmentin  15. Allergy to Augmentin: -discussed her hospitalization as a result of this allergy.   16. CKD:  -continue drinking water  17. Gout: -recommended tart cherry juice -discussed allopurinol but would not recommend this given her CKD -discussed that the steroids helped somewhat  18. Macular degeneration: -discussed red light therapy  19. Insomnia: -prescribed trazodone and lyrica  20. Impaired ambulation: -prescribed PT  40 minutes spent in discussion of her impaired ambulation, insomnia, macular degeneration, gout, CKD, allergy to Augmentin, transaminitis, difficulties with tying  to get her granddaughter organized, drinking water, discussed allopurinol for gout, prescribed PT and provided with phone number to schedule, discussed red light therapy for her macular degeneration

## 2023-06-17 NOTE — Addendum Note (Signed)
Addended by: Horton Chin on: 06/17/2023 01:37 PM   Modules accepted: Orders, Level of Service

## 2023-06-21 ENCOUNTER — Other Ambulatory Visit: Payer: Self-pay | Admitting: Family Medicine

## 2023-06-21 DIAGNOSIS — Z1231 Encounter for screening mammogram for malignant neoplasm of breast: Secondary | ICD-10-CM

## 2023-06-24 ENCOUNTER — Telehealth: Payer: Self-pay | Admitting: Physician Assistant

## 2023-06-24 NOTE — Telephone Encounter (Signed)
Incoming call from patient states she is returning Emily's call. Says she is no longer taking [facility]. Please advise

## 2023-06-26 ENCOUNTER — Ambulatory Visit (INDEPENDENT_AMBULATORY_CARE_PROVIDER_SITE_OTHER): Payer: 59 | Admitting: Podiatry

## 2023-06-26 ENCOUNTER — Encounter: Payer: Self-pay | Admitting: Podiatry

## 2023-06-26 DIAGNOSIS — N1831 Chronic kidney disease, stage 3a: Secondary | ICD-10-CM

## 2023-06-26 DIAGNOSIS — M79675 Pain in left toe(s): Secondary | ICD-10-CM | POA: Diagnosis not present

## 2023-06-26 DIAGNOSIS — E119 Type 2 diabetes mellitus without complications: Secondary | ICD-10-CM | POA: Diagnosis not present

## 2023-06-26 DIAGNOSIS — E1122 Type 2 diabetes mellitus with diabetic chronic kidney disease: Secondary | ICD-10-CM

## 2023-06-26 DIAGNOSIS — B351 Tinea unguium: Secondary | ICD-10-CM | POA: Diagnosis not present

## 2023-06-26 DIAGNOSIS — M2012 Hallux valgus (acquired), left foot: Secondary | ICD-10-CM

## 2023-06-26 DIAGNOSIS — M79674 Pain in right toe(s): Secondary | ICD-10-CM

## 2023-06-26 DIAGNOSIS — M2011 Hallux valgus (acquired), right foot: Secondary | ICD-10-CM

## 2023-06-30 NOTE — Progress Notes (Signed)
ANNUAL DIABETIC FOOT EXAM  Subjective: [name] presents today annual diabetic foot exam. Chief Complaint  Patient presents with   Nail Problem     Routine foot care   Patient confirms h/o diabetes.  Patient denies any h/o foot wounds.  Patient has been diagnosed with neuropathy.  Risk factors: diabetes, HTN, CKD, dyslipidemia.  Isabella Sow, MD is patient's PCP.  Past Medical History:  Diagnosis Date   Allergy    Anemia    Arthritis    Asthma    Dianosed years ago-no meds at this time   Cancer [facility]) 2021   endometrial    Cataract    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 name of MD    Dementia (HCC)    Diabetes mellitus    type 2    Diverticulosis    Dyslipidemia    Dyspnea    with exertion    Dysrhythmia    "skips beats"   Endometrial cancer (HCC) 2021   Family history of adverse reaction to anesthesia    son stopped breathing during surgery , mother slow to wake up    Fibromyalgia 1980's   Generalized anxiety disorder    GERD (gastroesophageal reflux disease)    occ- will use Tums and Prolosec  prn    Heart murmur    Hyperlipemia    Hypertension    not on blood pressure meds due to allergies - last dose of meds- months ago    Hypothyroidism    Hypothyroidism (acquired)    Internal hemorrhoids    Irritable bowel syndrome 1980's   Liver cancer (HCC)    Major depressive disorder    Migraine headaches 1980's   On meds-well controlled   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   Morbid obesity (HCC)    Pernicious anemia    Pneumonia 2009   Several times over the past several years   PONV (postoperative nausea and vomiting)    was told by neurologist due to calcificaton in brain not to be put to sleep, N/V during Op and Recovery    Recurrent upper respiratory infection (URI)    Sleep apnea    no cpap    Subarachnoid hemorrhage (HCC) 2010   Vaginal polyp    benign per pt   Patient Active Problem List   Diagnosis Date Noted   Jaundice 03/26/2023   Intermediate stage nonexudative age-related macular degeneration of left eye 12/19/2022   Bilateral hearing loss 06/04/2022   Dizziness 06/04/2022   Perforation of left tympanic membrane 06/04/2022   Primary insomnia 05/06/2022   B12 deficiency 05/06/2022   Local edema 05/06/2022   Neuropathic pain    Essential hypertension    Controlled type 2 diabetes mellitus with hyperglycemia, without long-term current use of insulin (HCC)    Depression    T7 vertebral fracture (HCC) 02/07/2022   Malnutrition of moderate degree 02/05/2022   Closed T7 fracture (HCC) 01/31/2022   Dementia (HCC) 01/31/2022   Diabetes mellitus without complication (HCC) 01/31/2022   Dyslipidemia 01/31/2022   Hypertension 01/31/2022   Hypothyroidism (acquired) 01/31/2022   Cervical stenosis of spinal canal 01/31/2022   Falls frequently 01/31/2022   Bilateral hand pain 01/31/2022   Vaginal discharge 11/26/2021   Anxiety and depression 10/30/2020   Carotid artery stenosis 10/30/2020   Facial droop 10/29/2020   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   HLD (hyperlipidemia) 10/29/2020   Endometrial cancer (HCC) 10/18/2020   CAD (coronary artery disease) 05/11/2020   Acute encephalopathy 02/26/2020   Hypothyroidism    Cerebral calcification 02/10/2020   Impairment of balance 02/10/2020   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   Essential tremor 06/24/2018   Migraine without aura 06/24/2018   Stress at home 08/28/2015   Post-menopausal bleeding 06/16/2011   Kidney disease 06/16/2011   Colon polyp 06/16/2011   Vaginal polyp 06/16/2011   Chronic diarrhea 06/16/2011   Diverticulitis 06/16/2011   DUB (dysfunctional uterine bleeding) 06/16/2011   Fatty liver 06/16/2011   Type 2 diabetes mellitus with stage 3 chronic kidney disease, without long-term current use of insulin  (HCC) 06/16/2011   Past Surgical History:  Procedure Laterality Date   CATARACT EXTRACTION Right    CHOLECYSTECTOMY  1983   COLONOSCOPY  2008   DB   DILATION AND CURETTAGE OF UTERUS  1972   HYSTEROSCOPY WITH D & C  06/29/2011   Procedure: DILATATION AND CURETTAGE (D&C) /HYSTEROSCOPY;  Surgeon: Reva Bores, MD;  Location: [facility];  Service: Gynecology;  Laterality: N/A;   ROBOTIC ASSISTED TOTAL HYSTERECTOMY WITH BILATERAL SALPINGO OOPHERECTOMY N/A 10/27/2020   Procedure: XI ROBOTIC ASSISTED TOTAL HYSTERECTOMY WITH BILATERAL SALPINGO OOPHORECTOMY;  Surgeon: Adolphus Birchwood, MD;  Location: [facility];  Service: Gynecology;  Laterality: N/A;   SENTINEL NODE BIOPSY N/A 10/27/2020   Procedure: SENTINEL NODE BIOPSY;  Surgeon: Adolphus Birchwood, MD;  Location: [facility];  Service: Gynecology;  Laterality: N/A;   Current Outpatient Medications on File Prior to Visit  Medication Sig Dispense Refill   Cholecalciferol (VITAMIN D3) 25 MCG (1000 UT) capsule Take 1,000 Units by mouth daily.     colchicine 0.6 MG tablet 1 tab by mouth daily 10 tablet 1   cyanocobalamin (,VITAMIN B-12,) 1000 MCG/ML injection Inject 1,000 mcg into the muscle every 30 (thirty) days.      diclofenac Sodium (VOLTAREN) 1 % GEL Apply 2 g topically 4 (four) times daily. 100 g 0   dicyclomine (BENTYL) 20 MG tablet Take 1 tablet (20 mg total) by mouth every 8 (eight) hours as needed for spasms (AB pain). 30 tablet 0   glucose blood (ACCU-CHEK GUIDE) test strip Use to test blood sugar 3 times daily 100 each 2   levothyroxine (SYNTHROID) 50 MCG tablet Take 1 tablet (50 mcg total) by mouth daily before breakfast. 90 tablet 3   MAGNESIUM CITRATE PO Take 1 tablet by mouth 2 (two) times daily.     mirtazapine (REMERON) 15 MG tablet Take 1 tablet (15 mg total) by mouth at bedtime. 30 tablet 0   Multiple Vitamin (MULTIVITAMIN WITH MINERALS) TABS tablet Take 1 tablet by mouth daily. 30 tablet 0   Na Sulfate-K Sulfate-Mg Sulf 17.5-3.13-1.6 GM/177ML SOLN       oxyCODONE (OXY IR/ROXICODONE) 5 MG immediate release tablet Take 1 tablet (5 mg total) by mouth every 6 (six) hours as needed for moderate pain. (Patient not taking: Reported on 06/17/2023) 15 tablet 0   pantoprazole (PROTONIX) 20 MG tablet Take 20 mg by mouth 2 (two) times daily. As needed.     potassium chloride SA (KLOR-CON M) 20 MEQ tablet Take 1 tablet (20 mEq total) by mouth 2 (two) times daily. 180 tablet 1   pregabalin (LYRICA) 50 MG capsule Take 1 capsule (50 mg total) by mouth daily as needed (for pain). 90 capsule 3   Probiotic Product (VSL#3 DS) PACK Take 1 each by mouth  daily. 20 each 0   REPATHA SURECLICK 140 MG/ML SOAJ INJECT 1 PEN SUBCUTANEOUSLY EVERY 14 DAYS 6 mL 3   SYSTANE HYDRATION PF 0.4-0.3 % SOLN Place 1-2 drops into both eyes in the morning.     torsemide (DEMADEX) 20 MG tablet Take 1 tablet (20 mg total) by mouth daily. 90 tablet 1   traZODone (DESYREL) 50 MG tablet Take 1 tablet (50 mg total) by mouth at bedtime. 90 tablet 3   TRULANCE 3 MG TABS Take 1 tablet by mouth daily.     Current Facility-Administered Medications on File Prior to Visit  Medication Dose Route Frequency Provider Last Rate Last Admin   cyanocobalamin (VITAMIN B12) injection 1,000 mcg  1,000 mcg Intramuscular Q30 days Isabella Sow, MD   1,000 mcg at 06/04/23 1354    Allergies  Allergen Reactions   Atenolol Other (See Comments)    Stomach problems, chills   Atorvastatin Other (See Comments)    Retained fluid   Canagliflozin Other (See Comments)    ALL DIABETIC MEDS PER PT   abdominal pain, headache   Losartan Potassium Other (See Comments)    chills, HA, stomach pain   Acarbose Nausea Only and Other (See Comments)     headache,nausea   Cinnamon Other (See Comments)    Stomach pain   Empagliflozin Other (See Comments)     headaches   Glimepiride Other (See Comments)    ALL DIABETIC MEDS PER PT  abdominal pain, headache   Metformin And Related Nausea And Vomiting and Other (See  Comments)    Patient states that she has severe chills, headache and cramping additionally. Says blood sugar is uncontrolled because of this   Other Other (See Comments)    ALL DIABETIC MEDS PER PT    Pioglitazone Other (See Comments)    ALL DIABETIC MEDS PER PT = peripheral edema   Pravastatin Other (See Comments)    Retained fluid   Sitagliptin Other (See Comments)    ALL DIABETIC MEDS PER PT  Other reaction(s): headache   Spironolactone-Hctz Other (See Comments)   Statins Other (See Comments)    Myalgia   Sulfamethoxazole Other (See Comments)    ALL DIABETIC MEDS PER PT    Tramadol Hcl Itching   Atorvastatin Calcium Other (See Comments)     retain fld   Augmentin [Amoxicillin-Pot Clavulanate] Other (See Comments)    Caused jaundice   Crestor [Rosuvastatin] Other (See Comments)   Rosuvastatin Calcium    Bumex [Bumetanide] Rash and Other (See Comments)    Chills, rash, headaches, stomach issues also   Lasix [Furosemide] Rash and Other (See Comments)    Sugars increased   Sulfa Antibiotics Rash and Other (See Comments)    Mother has told patient in the past not to take, she cannot recall there reaction   Social History   Occupational History   Occupation: Retired     Comment: Social worker   Occupation: retired  Tobacco Use   Smoking status: Never   Smokeless tobacco: Never  Advertising account planner   Vaping status: Never Used  Substance and Sexual Activity   Alcohol use: Not Currently    Comment: quit 1978   Drug use: Never   Sexual activity: Not Currently   Family History  Problem Relation Age of Onset   Anesthesia problems Mother        hard to wake post op    Cerebrovascular Disease Mother        Never had stroke, but  had CEA.   Liver cancer Father        mets to liver    Bladder Cancer Father    Colon cancer Father        mets to colon    Schizophrenia Daughter        Schizoaffective disorder   Anesthesia problems Son        stopped breathing post op    Tremor Son     Anesthesia problems Granddaughter        PONV   High blood pressure Other    Diabetes Other    Arthritis Other    Schizophrenia Child    Tremor Child    Dementia Neg Hx    Esophageal cancer Neg Hx    Rectal cancer Neg Hx    Stomach cancer Neg Hx    Colon polyps Neg Hx    Immunization History  Administered Date(s) Administered   Influenza Split 10/08/2014   Influenza, High Dose Seasonal PF 08/10/2015, 07/27/2016, 12/31/2018, 09/22/2019   PFIZER(Purple Top)SARS-COV-2 Vaccination 06/16/2020, 07/07/2020   Pneumococcal Conjugate-13 07/27/2016   Pneumococcal Polysaccharide-23 10/08/2014   Tdap 12/31/2018, 01/30/2022   Zoster Recombinant(Shingrix) 12/29/2020     Review of Systems: Negative except as noted in the HPI.   Objective: There were no vitals filed for this visit.  [name] is a pleasant 72 y.o. female in NAD. AAO X 3.  Vascular Examination: Capillary refill time immediate b/l. Vascular status intact b/l with palpable pedal pulses. Pedal hair present b/l. No pain with calf compression b/l. Skin temperature gradient WNL b/l. No cyanosis or clubbing b/l. No ischemia or gangrene noted b/l.   Neurological Examination: Sensation grossly intact b/l with 10 gram monofilament. Vibratory sensation intact b/l.   Dermatological Examination: Pedal skin with normal turgor, texture and tone b/l.  No open wounds. No interdigital macerations.   Toenails 1-5 b/l thick, discolored, elongated with subungual debris and pain on dorsal palpation.   No hyperkeratotic nor porokeratotic lesions present on today's visit.  Musculoskeletal Examination: Muscle strength 5/5 to all lower extremity muscle groups bilaterally. HAV with bunion deformity noted b/l LE.  Radiographs: None  Last A1c:      Latest Ref Rng & Units 03/05/2023    1:44 PM 08/21/2022   11:40 AM  Hemoglobin A1C  Hemoglobin-A1c 4.6 - 6.5 % 7.2  6.9     Lab Results  Component Value Date   HGBA1C 7.2 (H) 03/05/2023    No results found. ADA Risk Categorization: Low Risk :  Patient has all of the following: Intact protective sensation No prior foot ulcer  No severe deformity Pedal pulses present  Assessment: 1. Pain due to onychomycosis of toenails of both feet   2. Hallux valgus, acquired, bilateral   3. Type 2 diabetes mellitus with stage 3a chronic kidney disease, without long-term current use of insulin (HCC)   4. Encounter for diabetic foot exam [facility])     Plan: -Patient was evaluated and treated. All patient's and/or POA's questions/concerns answered on today's visit. -Continue diabetic foot care principles: inspect feet daily, monitor glucose as recommended by PCP and/or Endocrinologist, and follow prescribed diet per PCP, Endocrinologist and/or dietician. -Continue supportive shoe gear daily. -Toenails 1-5 b/l were debrided in length and girth with sterile nail nippers and dremel without iatrogenic bleeding.  -Patient/POA to call should there be question/concern in the interim. Return in about 9 weeks (around 08/28/2023).  Freddie Breech, DPM

## 2023-07-01 DIAGNOSIS — H25012 Cortical age-related cataract, left eye: Secondary | ICD-10-CM | POA: Diagnosis not present

## 2023-07-01 DIAGNOSIS — H26491 Other secondary cataract, right eye: Secondary | ICD-10-CM | POA: Diagnosis not present

## 2023-07-01 DIAGNOSIS — Z961 Presence of intraocular lens: Secondary | ICD-10-CM | POA: Diagnosis not present

## 2023-07-01 DIAGNOSIS — H2512 Age-related nuclear cataract, left eye: Secondary | ICD-10-CM | POA: Diagnosis not present

## 2023-07-02 ENCOUNTER — Ambulatory Visit: Payer: 59 | Admitting: Family Medicine

## 2023-07-02 ENCOUNTER — Encounter: Payer: Self-pay | Admitting: Family Medicine

## 2023-07-02 VITALS — BP 136/74 | HR 84 | Temp 98.7°F | Resp 18 | Ht 62.0 in | Wt 193.1 lb

## 2023-07-02 DIAGNOSIS — N1831 Chronic kidney disease, stage 3a: Secondary | ICD-10-CM

## 2023-07-02 DIAGNOSIS — M10071 Idiopathic gout, right ankle and foot: Secondary | ICD-10-CM | POA: Diagnosis not present

## 2023-07-02 DIAGNOSIS — I10 Essential (primary) hypertension: Secondary | ICD-10-CM | POA: Diagnosis not present

## 2023-07-02 DIAGNOSIS — R600 Localized edema: Secondary | ICD-10-CM

## 2023-07-02 DIAGNOSIS — E038 Other specified hypothyroidism: Secondary | ICD-10-CM

## 2023-07-02 DIAGNOSIS — E538 Deficiency of other specified B group vitamins: Secondary | ICD-10-CM

## 2023-07-02 DIAGNOSIS — E1122 Type 2 diabetes mellitus with diabetic chronic kidney disease: Secondary | ICD-10-CM

## 2023-07-02 DIAGNOSIS — D649 Anemia, unspecified: Secondary | ICD-10-CM

## 2023-07-02 DIAGNOSIS — G720 Drug-induced myopathy: Secondary | ICD-10-CM

## 2023-07-02 LAB — COMPREHENSIVE METABOLIC PANEL
ALT: 10 U/L (ref 0–35)
AST: 16 U/L (ref 0–37)
Albumin: 4.1 g/dL (ref 3.5–5.2)
Alkaline Phosphatase: 61 U/L (ref 39–117)
BUN: 21 mg/dL (ref 6–23)
CO2: 26 mEq/L (ref 19–32)
Calcium: 9.6 mg/dL (ref 8.4–10.5)
Chloride: 106 mEq/L (ref 96–112)
Creatinine, Ser: 1.25 mg/dL — ABNORMAL HIGH (ref 0.40–1.20)
GFR: 42.7 mL/min — ABNORMAL LOW (ref >60.00)
Glucose, Bld: 185 mg/dL — ABNORMAL HIGH (ref 70–99)
Potassium: 3.9 mEq/L (ref 3.5–5.1)
Sodium: 139 mEq/L (ref 135–145)
Total Bilirubin: 0.6 mg/dL (ref 0.2–1.2)
Total Protein: 7 g/dL (ref 6.0–8.3)

## 2023-07-02 LAB — LIPID PANEL
Cholesterol: 118 mg/dL (ref 0–200)
HDL: 45.6 mg/dL (ref >39.00)
LDL Cholesterol: 44 mg/dL (ref 0–99)
NonHDL: 72.4
Total CHOL/HDL Ratio: 3
Triglycerides: 143 mg/dL (ref 0.0–149.0)
VLDL: 28.6 mg/dL (ref 0.0–40.0)

## 2023-07-02 LAB — CBC WITH DIFFERENTIAL/PLATELET
Basophils Absolute: 0 10*3/uL (ref 0.0–0.1)
Basophils Relative: 0.4 % (ref 0.0–3.0)
Eosinophils Absolute: 0.1 10*3/uL (ref 0.0–0.7)
Eosinophils Relative: 2.5 % (ref 0.0–5.0)
HCT: 33.8 % — ABNORMAL LOW (ref 36.0–46.0)
Hemoglobin: 11.4 g/dL — ABNORMAL LOW (ref 12.0–15.0)
Lymphocytes Relative: 23.8 % (ref 12.0–46.0)
Lymphs Abs: 1.1 10*3/uL (ref 0.7–4.0)
MCHC: 33.6 g/dL (ref 30.0–36.0)
MCV: 90 fl (ref 78.0–100.0)
Monocytes Absolute: 0.4 10*3/uL (ref 0.1–1.0)
Monocytes Relative: 9.5 % (ref 3.0–12.0)
Neutro Abs: 3 10*3/uL (ref 1.4–7.7)
Neutrophils Relative %: 63.8 % (ref 43.0–77.0)
Platelets: 222 10*3/uL (ref 150.0–400.0)
RBC: 3.76 Mil/uL — ABNORMAL LOW (ref 3.87–5.11)
RDW: 13.2 % (ref 11.5–15.5)
WBC: 4.7 10*3/uL (ref 4.0–10.5)

## 2023-07-02 LAB — URIC ACID: Uric Acid, Serum: 9.5 mg/dL — ABNORMAL HIGH (ref 2.4–7.0)

## 2023-07-02 LAB — TSH: TSH: 1.32 u[IU]/mL (ref 0.35–5.50)

## 2023-07-02 LAB — HEMOGLOBIN A1C: Hgb A1c MFr Bld: 7.7 % — ABNORMAL HIGH (ref 4.6–6.5)

## 2023-07-02 MED ORDER — CYANOCOBALAMIN 1000 MCG/ML IJ SOLN
1000.0000 ug | Freq: Once | INTRAMUSCULAR | Status: AC
Start: 2023-07-02 — End: 2023-07-02
  Administered 2023-07-02: 1000 ug via INTRAMUSCULAR

## 2023-07-02 MED ORDER — EPLERENONE 25 MG PO TABS
25.0000 mg | ORAL_TABLET | Freq: Every day | ORAL | 0 refills | Status: DC
Start: 2023-07-02 — End: 2023-07-24

## 2023-07-02 NOTE — Patient Instructions (Signed)
It was very nice to see you today!  Start the inspra for fluid but don't take the potassium with it   PLEASE NOTE:  If you had any lab tests please let us know if you have not heard back within a few days. You may see your results on MyChart before we have a chance to review them but we will give you a call once they are reviewed by [name]. If we ordered any referrals today, please let us know if you have not heard from their office within the next week.   Please try these tips to maintain a healthy lifestyle:  Eat most of your calories during the day when you are active. Eliminate processed foods including packaged sweets (pies, cakes, cookies), reduce intake of potatoes, white bread, white pasta, and white rice. Look for whole grain options, oat flour or almond flour.  Each meal should contain half fruits/vegetables, one quarter protein, and one quarter carbs (no bigger than a computer mouse).  Cut down on sweet beverages. This includes juice, soda, and sweet tea. Also watch fruit intake, though this is a healthier sweet option, it still contains natural sugar! Limit to 3 servings daily.  Drink at least 1 glass of water with each meal and aim for at least 8 glasses per day  Exercise at least 150 minutes every week.

## 2023-07-02 NOTE — Progress Notes (Signed)
Subjective:     Patient ID: [name], female    DOB: Feb 12, 1951, 72 y.o.   MRN: [id number]  Chief Complaint  Patient presents with   Medical Management of Chronic Issues    4 week follow-up on HTN, B 12, dm     HPI She is accompanied by her daughter.   HTN - Her blood pressure was elevated at initial check, at 145/77. When rechecked, her BP improved to 136/74. She attributes her high BP to white coat. No ha/dizziness/cp/palp/cough.  DM - She has not been maintaining a healthy diet. Intolerant/SE to mult meds.  Edema - She has been taking Torsemide 20 mg. She endorses chills, headaches, abd pain, acid reflux, and weight gain since starting Torsemide. Notes intolerances to other medications, such as Lasix, bumex.   Gout - Has had trouble walking due to swelling in her right foot. She believes it may be a flare up.   Shortness of breath - She states she has been mildly short of breath. She believes it may be attributed to recently finding out she has mold in her 2 AC units. She notes her granddaughter that live with her also have symptoms as well as daughter.   HLD - Is taking Repatha 140 mg once every 2 weeks.   Memory - She has noticed slightly worse difficulties with her short term memory recently.    Health Maintenance Due  Topic Date Due   Medicare Annual Wellness (AWV)  Never done   OPHTHALMOLOGY EXAM  Never done   Colonoscopy  06/04/2023    Past Medical History:  Diagnosis Date   Allergy    Anemia    Arthritis    Asthma    Dianosed years ago-no meds at this time   Cancer [facility]) 2021   endometrial    Cataract    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 name of MD    Dementia (HCC)    Diabetes mellitus    type 2    Diverticulosis    Dyslipidemia    Dyspnea    with exertion    Dysrhythmia    "skips beats"    Endometrial cancer (HCC) 2021   Family history of adverse reaction to anesthesia    son stopped breathing during surgery , mother slow to wake up    Fibromyalgia 1980's   Generalized anxiety disorder    GERD (gastroesophageal reflux disease)    occ- will use Tums and Prolosec  prn    Heart murmur    Hyperlipemia    Hypertension    not on blood pressure meds due to allergies - last dose of meds- months ago    Hypothyroidism    Hypothyroidism (acquired)    Internal hemorrhoids    Irritable bowel syndrome 1980's   Liver cancer (HCC)    Major depressive disorder    Migraine headaches 1980's   On meds-well controlled   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   Morbid obesity (HCC)    Pernicious anemia    Pneumonia 2009   Several times over the past several years   PONV (postoperative nausea and vomiting)    was told by neurologist due to calcificaton in brain not to be put to sleep, N/V during Op and Recovery    Recurrent upper respiratory infection (URI)    Sleep apnea  no cpap   Subarachnoid hemorrhage (HCC) 2010   Vaginal polyp    benign per pt    Past Surgical History:  Procedure Laterality Date   CATARACT EXTRACTION Right    CHOLECYSTECTOMY  1983   COLONOSCOPY  2008   DB   DILATION AND CURETTAGE OF UTERUS  1972   HYSTEROSCOPY WITH D & C  06/29/2011   Procedure: DILATATION AND CURETTAGE (D&C) /HYSTEROSCOPY;  Surgeon: Reva Bores, MD;  Location: [facility];  Service: Gynecology;  Laterality: N/A;   ROBOTIC ASSISTED TOTAL HYSTERECTOMY WITH BILATERAL SALPINGO OOPHERECTOMY N/A 10/27/2020   Procedure: XI ROBOTIC ASSISTED TOTAL HYSTERECTOMY WITH BILATERAL SALPINGO OOPHORECTOMY;  Surgeon: Adolphus Birchwood, MD;  Location: [facility];  Service: Gynecology;  Laterality: N/A;   SENTINEL NODE BIOPSY N/A 10/27/2020   Procedure: SENTINEL NODE BIOPSY;  Surgeon: Adolphus Birchwood, MD;  Location: [facility];  Service: Gynecology;  Laterality: N/A;     Current Outpatient Medications:    Cholecalciferol  (VITAMIN D3) 25 MCG (1000 UT) capsule, Take 1,000 Units by mouth daily., Disp: , Rfl:    colchicine 0.6 MG tablet, 1 tab by mouth daily, Disp: 10 tablet, Rfl: 1   cyanocobalamin (,VITAMIN B-12,) 1000 MCG/ML injection, Inject 1,000 mcg into the muscle every 30 (thirty) days. , Disp: , Rfl:    diclofenac Sodium (VOLTAREN) 1 % GEL, Apply 2 g topically 4 (four) times daily., Disp: 100 g, Rfl: 0   dicyclomine (BENTYL) 20 MG tablet, Take 1 tablet (20 mg total) by mouth every 8 (eight) hours as needed for spasms (AB pain)., Disp: 30 tablet, Rfl: 0   eplerenone (INSPRA) 25 MG tablet, Take 1 tablet (25 mg total) by mouth daily., Disp: 30 tablet, Rfl: 0   glucose blood (ACCU-CHEK GUIDE) test strip, Use to test blood sugar 3 times daily, Disp: 100 each, Rfl: 2   levothyroxine (SYNTHROID) 50 MCG tablet, Take 1 tablet (50 mcg total) by mouth daily before breakfast., Disp: 90 tablet, Rfl: 3   MAGNESIUM CITRATE PO, Take 1 tablet by mouth 2 (two) times daily., Disp: , Rfl:    mirtazapine (REMERON) 15 MG tablet, Take 1 tablet (15 mg total) by mouth at bedtime., Disp: 30 tablet, Rfl: 0   Multiple Vitamin (MULTIVITAMIN WITH MINERALS) TABS tablet, Take 1 tablet by mouth daily., Disp: 30 tablet, Rfl: 0   Na Sulfate-K Sulfate-Mg Sulf 17.5-3.13-1.6 GM/177ML SOLN, , Disp: , Rfl:    oxyCODONE (OXY IR/ROXICODONE) 5 MG immediate release tablet, Take 1 tablet (5 mg total) by mouth every 6 (six) hours as needed for moderate pain., Disp: 15 tablet, Rfl: 0   pantoprazole (PROTONIX) 20 MG tablet, Take 20 mg by mouth 2 (two) times daily. As needed., Disp: , Rfl:    potassium chloride SA (KLOR-CON M) 20 MEQ tablet, Take 1 tablet (20 mEq total) by mouth 2 (two) times daily., Disp: 180 tablet, Rfl: 1   pregabalin (LYRICA) 50 MG capsule, Take 1 capsule (50 mg total) by mouth daily as needed (for pain)., Disp: 90 capsule, Rfl: 3   Probiotic Product (VSL#3 DS) PACK, Take 1 each by mouth daily., Disp: 20 each, Rfl: 0   REPATHA SURECLICK  140 MG/ML SOAJ, INJECT 1 PEN SUBCUTANEOUSLY EVERY 14 DAYS, Disp: 6 mL, Rfl: 3   SYSTANE HYDRATION PF 0.4-0.3 % SOLN, Place 1-2 drops into both eyes in the morning., Disp: , Rfl:    traZODone (DESYREL) 50 MG tablet, Take 1 tablet (50 mg total) by mouth at bedtime., Disp: 90 tablet, Rfl:  3   TRULANCE 3 MG TABS, Take 1 tablet by mouth daily., Disp: , Rfl:   Current Facility-Administered Medications:    cyanocobalamin (VITAMIN B12) injection 1,000 mcg, 1,000 mcg, Intramuscular, Q30 days, Jeani Sow, MD, 1,000 mcg at 06/04/23 1354  Allergies  Allergen Reactions   Atenolol Other (See Comments)    Stomach problems, chills   Atorvastatin Other (See Comments)    Retained fluid   Canagliflozin Other (See Comments)    ALL DIABETIC MEDS PER PT   abdominal pain, headache   Losartan Potassium Other (See Comments)    chills, HA, stomach pain   Acarbose Nausea Only and Other (See Comments)     headache,nausea   Cinnamon Other (See Comments)    Stomach pain   Empagliflozin Other (See Comments)     headaches   Glimepiride Other (See Comments)    ALL DIABETIC MEDS PER PT  abdominal pain, headache   Metformin And Related Nausea And Vomiting and Other (See Comments)    Patient states that she has severe chills, headache and cramping additionally. Says blood sugar is uncontrolled because of this   Other Other (See Comments)    ALL DIABETIC MEDS PER PT    Pioglitazone Other (See Comments)    ALL DIABETIC MEDS PER PT = peripheral edema   Pravastatin Other (See Comments)    Retained fluid   Sitagliptin Other (See Comments)    ALL DIABETIC MEDS PER PT  Other reaction(s): headache   Spironolactone-Hctz Other (See Comments)   Statins Other (See Comments)    Myalgia   Sulfamethoxazole Other (See Comments)    ALL DIABETIC MEDS PER PT    Tramadol Hcl Itching   Atorvastatin Calcium Other (See Comments)     retain fld   Augmentin [Amoxicillin-Pot Clavulanate] Other (See Comments)    Caused  jaundice   Crestor [Rosuvastatin] Other (See Comments)   Rosuvastatin Calcium    Torsemide Nausea Only    Gi upset, chills, abd pain,bloating   Bumex [Bumetanide] Rash and Other (See Comments)    Chills, rash, headaches, stomach issues also   Lasix [Furosemide] Rash and Other (See Comments)    Sugars increased   Sulfa Antibiotics Rash and Other (See Comments)    Mother has told patient in the past not to take, she cannot recall there reaction   ROS neg/noncontributory except as noted HPI/below      Objective:     BP 136/74 (BP Location: Left Arm, Patient Position: Sitting, Cuff Size: Large)   Pulse 84   Temp 98.7 F (37.1 C) (Temporal)   Resp 18   Ht 5\' 2"  (1.575 m)   Wt 193 lb 2 oz (87.6 kg)   SpO2 96%   BMI 35.32 kg/m  Wt Readings from Last 3 Encounters:  07/02/23 193 lb 2 oz (87.6 kg)  06/17/23 192 lb 12.8 oz (87.5 kg)  06/04/23 190 lb (86.2 kg)    Physical Exam   Gen: WDWN NAD HEENT: NCAT, conjunctiva not injected, sclera nonicteric NECK:  supple, no thyromegaly, no nodes, no carotid bruits CARDIAC: RRR, S1S2+. DP 2+B. +2/6 murmur  LUNGS: CTAB. No wheezes ABDOMEN:  BS+, soft, NTND, No HSM, no masses EXT:  1+ edema MSK: no gross abnormalities.  NEURO: A&O x3.  CN II-XII intact.  PSYCH: normal mood. Good eye contact     Assessment & Plan:  Type 2 diabetes mellitus with stage 3a chronic kidney disease, without long-term current use of insulin (HCC) -  Comprehensive metabolic panel -     Hemoglobin A1c -     Lipid panel  Essential hypertension -     Eplerenone; Take 1 tablet (25 mg total) by mouth daily.  Dispense: 30 tablet; Refill: 0 -     CBC with Differential/Platelet -     Basic metabolic panel; Future  B12 deficiency -     Cyanocobalamin  Statin myopathy  Local edema -     Eplerenone; Take 1 tablet (25 mg total) by mouth daily.  Dispense: 30 tablet; Refill: 0  Acute idiopathic gout of right foot -     Uric acid  Anemia, unspecified  type -     CBC with Differential/Platelet -     Iron, TIBC and Ferritin Panel  Other specified hypothyroidism -     TSH  1.  Diabetes type 2 with stage IIIa chronic kidney disease-chronic.  Fair control.  She is not working on her diet, cannot exercise.  Intolerant to multiple medications. 2.  Hypertension-chronic.  Well-controlled.  Not on any antihypertensives. 3.  Chronic edema-intolerant to multiple medications.  Also having some gout so want to try to avoid thiazide diuretics.  Due to her multiple allergies, will trial Inspra 25 mg daily.  Recheck BMP in 1 to 2 weeks. 4.  Gout-still having pain in the right foot.  Uric acid has been elevated.  I have concerns about adding more medications to her regimen.  Check uric acid 5.  Anemia-seem to have started when she got jaundiced with Augmentin.  (In April).  Things have not worsened, however they have not improved.  Will check iron panel and CBC 6.  Hypothyroidism-chronic.  Had been well-controlled on Synthroid 50 mcg daily.  Patient is feeling more fatigued.  Will recheck TSH 7.  B12 deficiency-chronic.  Getting B12 injections here monthly.  This was given today.  Follow-up in 3 weeks for edema, gout  Return in about 3 weeks (around 07/23/2023) for 3 wks me.  2 weeks lab bmp.   I,Rachel Rivera,acting as a scribe for Angelena Sole, MD.,have documented all relevant documentation on the behalf of Angelena Sole, MD,as directed by  Angelena Sole, MD while in the presence of Angelena Sole, MD.  I, Angelena Sole, MD, have reviewed all documentation for this visit. The documentation on 07/02/23 for the exam, diagnosis, procedures, and orders are all accurate and complete.   Angelena Sole, MD

## 2023-07-03 LAB — IRON,TIBC AND FERRITIN PANEL
%SAT: 13 % (calc) — ABNORMAL LOW (ref 16–45)
Ferritin: 107 ng/mL (ref 16–288)
Iron: 44 ug/dL — ABNORMAL LOW (ref 45–160)
TIBC: 328 mcg/dL (calc) (ref 250–450)

## 2023-07-04 NOTE — Telephone Encounter (Signed)
Patient is calling requesting to speak with you regarding previous message. Please advise. Thank you

## 2023-07-07 NOTE — Progress Notes (Signed)
1.  Thyroid-same dose 2.  Diabetes-has gotten worse.  Needs to work on it.  3.  Gout levels elevated.  Start uloric 40mg -every other day to start.  Drink plenty of water 4.  Anemia has improved some.  Iron levels are still low side. Can she take otc ferrous sulfate every other day?  Monitor for constipation. Or wait till after GI scopes

## 2023-07-08 ENCOUNTER — Telehealth: Payer: Self-pay | Admitting: Family Medicine

## 2023-07-08 ENCOUNTER — Other Ambulatory Visit: Payer: Self-pay | Admitting: *Deleted

## 2023-07-08 MED ORDER — FEBUXOSTAT 40 MG PO TABS: 40.0000 mg | ORAL_TABLET | ORAL | 0 refills | Status: DC

## 2023-07-08 NOTE — Telephone Encounter (Signed)
Patient contacted and given lab results and recommendations. Patient stated understanding and did not have any questions or concerns at this time.   Patient stated that she is not tolerating inspra well, still retaining fluid, having severe chills, stomach pain. Patient stated her stomach cannot tolerate iron pills.

## 2023-07-08 NOTE — Telephone Encounter (Signed)
Pt would like a call back with lab results 

## 2023-07-09 ENCOUNTER — Other Ambulatory Visit: Payer: Self-pay | Admitting: *Deleted

## 2023-07-09 ENCOUNTER — Telehealth: Payer: Self-pay | Admitting: Family Medicine

## 2023-07-09 DIAGNOSIS — E611 Iron deficiency: Secondary | ICD-10-CM

## 2023-07-09 DIAGNOSIS — I6523 Occlusion and stenosis of bilateral carotid arteries: Secondary | ICD-10-CM

## 2023-07-09 NOTE — Telephone Encounter (Signed)
Left message to return call 

## 2023-07-09 NOTE — Telephone Encounter (Signed)
Patient has agreed to hematology referral for iron infusions.

## 2023-07-09 NOTE — Telephone Encounter (Signed)
Patient called requesting to speak with Fausto Skillern about a medication she was recently prescribed. States she spoke to pharmacy about this and wanted to update PCP team.

## 2023-07-10 NOTE — Telephone Encounter (Signed)
Left message to return my call. Medication needed a PA, completed through covermymeds, pending approval.  Charlean Merl (Key: [id number])  form thumbnail [employer] is reviewing your PA request. Typically an electronic response will be received within 24-72 hours. To check for an update later, open this request from your dashboard.  You may close this dialog and return to your dashboard to perform other tasks.

## 2023-07-10 NOTE — Telephone Encounter (Signed)
Patient informed that PA is needed, was completed this morning and that when insurance make their decision, I will let her know. Patient verbalized understanding.

## 2023-07-10 NOTE — Telephone Encounter (Signed)
Patient returned call

## 2023-07-11 ENCOUNTER — Telehealth: Payer: Self-pay | Admitting: Family Medicine

## 2023-07-11 NOTE — Telephone Encounter (Signed)
Pt would like a call back in regards to the kidney doctor referral. They state they need some more information for t hem to see pt. Please call pt back with details.

## 2023-07-11 NOTE — Telephone Encounter (Signed)
Spoke to patient and she stated that kidney doctor needed proof of uric acid being elevated since patient hasn't been seen since February 2024. Labs faxed to office.

## 2023-07-16 ENCOUNTER — Other Ambulatory Visit: Payer: 59

## 2023-07-16 NOTE — Progress Notes (Signed)
[name] is a 72 y.o. female here for a follow up of a pre-existing problem.  History of Present Illness:   No chief complaint on file.   HPI   Has been having pain    Past Medical History:  Diagnosis Date   Allergy    Anemia    Arthritis    Asthma    Dianosed years ago-no meds at this time   Cancer [facility]) 2021   endometrial    Cataract    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 name of MD    Dementia (HCC)    Diabetes mellitus    type 2    Diverticulosis    Dyslipidemia    Dyspnea    with exertion    Dysrhythmia    "skips beats"   Endometrial cancer (HCC) 2021   Family history of adverse reaction to anesthesia    son stopped breathing during surgery , mother slow to wake up    Fibromyalgia 1980's   Generalized anxiety disorder    GERD (gastroesophageal reflux disease)    occ- will use Tums and Prolosec  prn    Heart murmur    Hyperlipemia    Hypertension    not on blood pressure meds due to allergies - last dose of meds- months ago    Hypothyroidism    Hypothyroidism (acquired)    Internal hemorrhoids    Irritable bowel syndrome 1980's   Liver cancer (HCC)    Major depressive disorder    Migraine headaches 1980's   On meds-well controlled   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   Morbid obesity (HCC)    Pernicious anemia    Pneumonia 2009   Several times over the past several years   PONV (postoperative nausea and vomiting)    was told by neurologist due to calcificaton in brain not to be put to sleep, N/V during Op and Recovery    Recurrent upper respiratory infection (URI)    Sleep apnea    no cpap   Subarachnoid hemorrhage (HCC) 2010   Vaginal polyp    benign per pt     Social History   Tobacco Use   Smoking status: Never   Smokeless tobacco: Never  Vaping Use   Vaping status: Never Used  Substance  Use Topics   Alcohol use: Not Currently    Comment: quit 1978   Drug use: Never    Past Surgical History:  Procedure Laterality Date   CATARACT EXTRACTION Right    CHOLECYSTECTOMY  1983   COLONOSCOPY  2008   DB   DILATION AND CURETTAGE OF UTERUS  1972   HYSTEROSCOPY WITH D & C  06/29/2011   Procedure: DILATATION AND CURETTAGE (D&C) /HYSTEROSCOPY;  Surgeon: Reva Bores, MD;  Location: [facility];  Service: Gynecology;  Laterality: N/A;   ROBOTIC ASSISTED TOTAL HYSTERECTOMY WITH BILATERAL SALPINGO OOPHERECTOMY N/A 10/27/2020   Procedure: XI ROBOTIC ASSISTED TOTAL HYSTERECTOMY WITH BILATERAL SALPINGO OOPHORECTOMY;  Surgeon: Adolphus Birchwood, MD;  Location: [facility];  Service: Gynecology;  Laterality: N/A;   SENTINEL NODE BIOPSY N/A 10/27/2020   Procedure: SENTINEL NODE BIOPSY;  Surgeon: Adolphus Birchwood, MD;  Location: [facility];  Service: Gynecology;  Laterality: N/A;    Family History  Problem Relation Age of Onset   Anesthesia problems Mother  hard to wake post op    Cerebrovascular Disease Mother        Never had stroke, but had CEA.   Liver cancer Father        mets to liver    Bladder Cancer Father    Colon cancer Father        mets to colon    Schizophrenia Daughter        Schizoaffective disorder   Anesthesia problems Son        stopped breathing post op    Tremor Son    Anesthesia problems Granddaughter        PONV   High blood pressure Other    Diabetes Other    Arthritis Other    Schizophrenia Child    Tremor Child    Dementia Neg Hx    Esophageal cancer Neg Hx    Rectal cancer Neg Hx    Stomach cancer Neg Hx    Colon polyps Neg Hx     Allergies  Allergen Reactions   Atenolol Other (See Comments)    Stomach problems, chills   Atorvastatin Other (See Comments)    Retained fluid   Canagliflozin Other (See Comments)    ALL DIABETIC MEDS PER PT   abdominal pain, headache   Losartan Potassium Other (See Comments)    chills, HA, stomach pain   Acarbose Nausea Only  and Other (See Comments)     headache,nausea   Cinnamon Other (See Comments)    Stomach pain   Empagliflozin Other (See Comments)     headaches   Glimepiride Other (See Comments)    ALL DIABETIC MEDS PER PT  abdominal pain, headache   Metformin And Related Nausea And Vomiting and Other (See Comments)    Patient states that she has severe chills, headache and cramping additionally. Says blood sugar is uncontrolled because of this   Other Other (See Comments)    ALL DIABETIC MEDS PER PT    Pioglitazone Other (See Comments)    ALL DIABETIC MEDS PER PT = peripheral edema   Pravastatin Other (See Comments)    Retained fluid   Sitagliptin Other (See Comments)    ALL DIABETIC MEDS PER PT  Other reaction(s): headache   Spironolactone-Hctz Other (See Comments)   Statins Other (See Comments)    Myalgia   Sulfamethoxazole Other (See Comments)    ALL DIABETIC MEDS PER PT    Tramadol Hcl Itching   Atorvastatin Calcium Other (See Comments)     retain fld   Augmentin [Amoxicillin-Pot Clavulanate] Other (See Comments)    Caused jaundice   Crestor [Rosuvastatin] Other (See Comments)   Rosuvastatin Calcium    Torsemide Nausea Only    Gi upset, chills, abd pain,bloating   Bumex [Bumetanide] Rash and Other (See Comments)    Chills, rash, headaches, stomach issues also   Lasix [Furosemide] Rash and Other (See Comments)    Sugars increased   Sulfa Antibiotics Rash and Other (See Comments)    Mother has told patient in the past not to take, she cannot recall there reaction    Current Medications:   Current Outpatient Medications:    Cholecalciferol (VITAMIN D3) 25 MCG (1000 UT) capsule, Take 1,000 Units by mouth daily., Disp: , Rfl:    colchicine 0.6 MG tablet, 1 tab by mouth daily, Disp: 10 tablet, Rfl: 1   cyanocobalamin (,VITAMIN B-12,) 1000 MCG/ML injection, Inject 1,000 mcg into the muscle every 30 (thirty) days. , Disp: , Rfl:  diclofenac Sodium (VOLTAREN) 1 % GEL, Apply 2 g  topically 4 (four) times daily., Disp: 100 g, Rfl: 0   dicyclomine (BENTYL) 20 MG tablet, Take 1 tablet (20 mg total) by mouth every 8 (eight) hours as needed for spasms (AB pain)., Disp: 30 tablet, Rfl: 0   eplerenone (INSPRA) 25 MG tablet, Take 1 tablet (25 mg total) by mouth daily., Disp: 30 tablet, Rfl: 0   febuxostat (ULORIC) 40 MG tablet, Take 1 tablet (40 mg total) by mouth every other day., Disp: 30 tablet, Rfl: 0   glucose blood (ACCU-CHEK GUIDE) test strip, Use to test blood sugar 3 times daily, Disp: 100 each, Rfl: 2   levothyroxine (SYNTHROID) 50 MCG tablet, Take 1 tablet (50 mcg total) by mouth daily before breakfast., Disp: 90 tablet, Rfl: 3   MAGNESIUM CITRATE PO, Take 1 tablet by mouth 2 (two) times daily., Disp: , Rfl:    mirtazapine (REMERON) 15 MG tablet, Take 1 tablet (15 mg total) by mouth at bedtime., Disp: 30 tablet, Rfl: 0   Multiple Vitamin (MULTIVITAMIN WITH MINERALS) TABS tablet, Take 1 tablet by mouth daily., Disp: 30 tablet, Rfl: 0   Na Sulfate-K Sulfate-Mg Sulf 17.5-3.13-1.6 GM/177ML SOLN, , Disp: , Rfl:    oxyCODONE (OXY IR/ROXICODONE) 5 MG immediate release tablet, Take 1 tablet (5 mg total) by mouth every 6 (six) hours as needed for moderate pain., Disp: 15 tablet, Rfl: 0   pantoprazole (PROTONIX) 20 MG tablet, Take 20 mg by mouth 2 (two) times daily. As needed., Disp: , Rfl:    potassium chloride SA (KLOR-CON M) 20 MEQ tablet, Take 1 tablet (20 mEq total) by mouth 2 (two) times daily., Disp: 180 tablet, Rfl: 1   pregabalin (LYRICA) 50 MG capsule, Take 1 capsule (50 mg total) by mouth daily as needed (for pain)., Disp: 90 capsule, Rfl: 3   Probiotic Product (VSL#3 DS) PACK, Take 1 each by mouth daily., Disp: 20 each, Rfl: 0   REPATHA SURECLICK 140 MG/ML SOAJ, INJECT 1 PEN SUBCUTANEOUSLY EVERY 14 DAYS, Disp: 6 mL, Rfl: 3   SYSTANE HYDRATION PF 0.4-0.3 % SOLN, Place 1-2 drops into both eyes in the morning., Disp: , Rfl:    traZODone (DESYREL) 50 MG tablet, Take 1  tablet (50 mg total) by mouth at bedtime., Disp: 90 tablet, Rfl: 3   TRULANCE 3 MG TABS, Take 1 tablet by mouth daily., Disp: , Rfl:   Current Facility-Administered Medications:    cyanocobalamin (VITAMIN B12) injection 1,000 mcg, 1,000 mcg, Intramuscular, Q30 days, Jeani Sow, MD, 1,000 mcg at 06/04/23 1354   Review of Systems:   ROS  Vitals:   There were no vitals filed for this visit.   There is no height or weight on file to calculate BMI.  Physical Exam:   Physical Exam  Assessment and Plan:   ***   I,Alexander Ruley,acting as a scribe for Jarold Motto, PA.,have documented all relevant documentation on the behalf of Jarold Motto, PA,as directed by  Jarold Motto, PA while in the presence of Jarold Motto, [place].   ***   Jarold Motto, PA-C

## 2023-07-17 ENCOUNTER — Encounter: Payer: Self-pay | Admitting: Physician Assistant

## 2023-07-17 ENCOUNTER — Other Ambulatory Visit: Payer: 59

## 2023-07-17 ENCOUNTER — Ambulatory Visit: Payer: 59 | Admitting: Physician Assistant

## 2023-07-17 VITALS — BP 128/70 | HR 86 | Temp 97.7°F | Ht 62.0 in | Wt 193.4 lb

## 2023-07-17 DIAGNOSIS — E790 Hyperuricemia without signs of inflammatory arthritis and tophaceous disease: Secondary | ICD-10-CM | POA: Diagnosis not present

## 2023-07-17 DIAGNOSIS — M2550 Pain in unspecified joint: Secondary | ICD-10-CM | POA: Diagnosis not present

## 2023-07-17 LAB — COMPREHENSIVE METABOLIC PANEL
ALT: 9 U/L (ref 0–35)
AST: 15 U/L (ref 0–37)
Albumin: 3.9 g/dL (ref 3.5–5.2)
Alkaline Phosphatase: 56 U/L (ref 39–117)
BUN: 23 mg/dL (ref 6–23)
CO2: 25 mEq/L (ref 19–32)
Calcium: 9.4 mg/dL (ref 8.4–10.5)
Chloride: 108 mEq/L (ref 96–112)
Creatinine, Ser: 1.21 mg/dL — ABNORMAL HIGH (ref 0.40–1.20)
GFR: 44.38 mL/min — ABNORMAL LOW (ref >60.00)
Glucose, Bld: 186 mg/dL — ABNORMAL HIGH (ref 70–99)
Potassium: 4 mEq/L (ref 3.5–5.1)
Sodium: 139 mEq/L (ref 135–145)
Total Bilirubin: 0.7 mg/dL (ref 0.2–1.2)
Total Protein: 6.8 g/dL (ref 6.0–8.3)

## 2023-07-17 LAB — URIC ACID: Uric Acid, Serum: 4.8 mg/dL (ref 2.4–7.0)

## 2023-07-17 MED ORDER — PREDNISONE 20 MG PO TABS: 20.0000 mg | ORAL_TABLET | Freq: Every day | ORAL | 0 refills | Status: DC

## 2023-07-17 NOTE — Patient Instructions (Signed)
It was great to see you!  Start 20 mg prednisone daily  Continue your febuxostat 40 mg daily  I will be in touch with results  Take care,  Jarold Motto PA-C

## 2023-07-22 ENCOUNTER — Telehealth: Payer: Self-pay | Admitting: Physician Assistant

## 2023-07-22 NOTE — Telephone Encounter (Signed)
Left message for pt to call back.  Pt called and states she has had several issues going on for the past couple of mths. She wanted to know if provider feels she should proceed with Colonoscopy.  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 4)States she has a cough at night when she lays down. 5)Reports some SOB and wheezing due to the cough 6)Has been told she has some memory loss 7)anemia  Please advise.

## 2023-07-22 NOTE — Telephone Encounter (Signed)
Inbound call from patient requesting a call back to discuss health concerns that she has before proceeding with 8/15 colonoscopy. Please advise, thank you.

## 2023-07-22 NOTE — Telephone Encounter (Signed)
Left message for pt to call back.  Pt aware of [name]'s recommendations. She sees her PCP this Wed 8/7 and will have cough/sob/wheezing eval. If she needs to cancel procedures she will call back. She wanted to schedule an OV with Dr. Lavon Paganini to have one scheduled in case she has to cancel procedures. Pt scheduled to see Dr. Lavon Paganini 11/01/23 at 1:30pm, pt aware of appt.

## 2023-07-23 ENCOUNTER — Encounter: Payer: Self-pay | Admitting: Gastroenterology

## 2023-07-24 ENCOUNTER — Ambulatory Visit (INDEPENDENT_AMBULATORY_CARE_PROVIDER_SITE_OTHER): Payer: 59 | Admitting: Family Medicine

## 2023-07-24 ENCOUNTER — Encounter: Payer: Self-pay | Admitting: Family Medicine

## 2023-07-24 VITALS — BP 162/80 | HR 90 | Temp 98.3°F | Resp 18 | Ht 62.0 in | Wt 197.1 lb

## 2023-07-24 DIAGNOSIS — R600 Localized edema: Secondary | ICD-10-CM

## 2023-07-24 DIAGNOSIS — M10071 Idiopathic gout, right ankle and foot: Secondary | ICD-10-CM

## 2023-07-24 DIAGNOSIS — D649 Anemia, unspecified: Secondary | ICD-10-CM | POA: Diagnosis not present

## 2023-07-24 DIAGNOSIS — I10 Essential (primary) hypertension: Secondary | ICD-10-CM

## 2023-07-24 DIAGNOSIS — F439 Reaction to severe stress, unspecified: Secondary | ICD-10-CM

## 2023-07-24 MED ORDER — FEBUXOSTAT 40 MG PO TABS: 40.0000 mg | ORAL_TABLET | Freq: Every day | ORAL | 2 refills | Status: DC

## 2023-07-24 MED ORDER — EPLERENONE 25 MG PO TABS
25.0000 mg | ORAL_TABLET | Freq: Every day | ORAL | 2 refills | Status: DC
Start: 2023-07-24 — End: 2023-10-07

## 2023-07-24 NOTE — Patient Instructions (Addendum)
Febuxostab-40mg  daily. Continue the water pill

## 2023-07-24 NOTE — Progress Notes (Signed)
Subjective:     Patient ID: [name], female    DOB: 01-17-1951, 72 y.o.   MRN: [id number]  Chief Complaint  Patient presents with   Follow-up    3 week follow-up on edema and gout     HPI Edema - She had been taking Torsemide 20 mg but had SE so restarted Eplerenone 25mg .  . Has had increased LE swelling and endorses weight gain. States she finds it difficult to keep her legs elevated. Has SE to this med as well.   Gout - She has not been taking Febuxostat 40 mg every other day for at least a week. Endorses noticeable swelling in her right leg and increased foot and hand pain  HTN - Pt is on Eplerenone 25 mg daily.  Bp at initial check was 179/92. Bp at recheck was 162/80. No ha/dizziness/palp.  Cough and Wheezing - She complains of coughing and wheezing when lying down. She says it is the worst at bedtime and endorses chest pain. Notes that she is still living with moldy AC units in her house. Daughter is worse.  Depression - She states her current familial and medical issues have increased her frustration and depression. No SI.  Itchiness - She states she was exposed to poison ivy on her LE and her itchiness began today.   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 name of MD    Dementia (HCC)    Diabetes mellitus    type 2    Diverticulosis    Dyslipidemia    Dyspnea    with exertion    Dysrhythmia    "skips beats"   Endometrial cancer (HCC) 2021   Family history of adverse reaction to anesthesia    son  stopped breathing during surgery , mother slow to wake up    Fibromyalgia 1980's   Generalized anxiety disorder    GERD (gastroesophageal reflux disease)    occ- will use Tums and Prolosec  prn    Heart murmur    Hyperlipemia    Hypertension    not on blood pressure meds due to allergies - last dose of meds- months ago    Hypothyroidism    Hypothyroidism (acquired)    Internal hemorrhoids    Irritable bowel syndrome 1980's   Liver cancer (HCC)    Major depressive disorder    Migraine headaches 1980's   On meds-well controlled   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   Morbid obesity (HCC)    Pernicious anemia    Pneumonia 2009   Several times over the past several years   PONV (postoperative nausea and vomiting)    was told by neurologist due to calcificaton in brain not to be put to sleep, N/V during Op and Recovery    Recurrent upper respiratory infection (URI)    Sleep apnea    no cpap   Subarachnoid hemorrhage (HCC)  2010   Vaginal polyp    benign per pt    Past Surgical History:  Procedure Laterality Date   CATARACT EXTRACTION Right    CHOLECYSTECTOMY  1983   COLONOSCOPY  2008   DB   DILATION AND CURETTAGE OF UTERUS  1972   HYSTEROSCOPY WITH D & C  06/29/2011   Procedure: DILATATION AND CURETTAGE (D&C) /HYSTEROSCOPY;  Surgeon: Reva Bores, MD;  Location: [facility];  Service: Gynecology;  Laterality: N/A;   ROBOTIC ASSISTED TOTAL HYSTERECTOMY WITH BILATERAL SALPINGO OOPHERECTOMY N/A 10/27/2020   Procedure: XI ROBOTIC ASSISTED TOTAL HYSTERECTOMY WITH BILATERAL SALPINGO OOPHORECTOMY;  Surgeon: Adolphus Birchwood, MD;  Location: [facility];  Service: Gynecology;  Laterality: N/A;   SENTINEL NODE BIOPSY N/A 10/27/2020   Procedure: SENTINEL NODE BIOPSY;  Surgeon: Adolphus Birchwood, MD;  Location: [facility];  Service: Gynecology;  Laterality: N/A;     Current Outpatient Medications:    Cholecalciferol (VITAMIN D3) 25 MCG (1000 UT) capsule, Take 1,000 Units by mouth daily., Disp: , Rfl:     cyanocobalamin (,VITAMIN B-12,) 1000 MCG/ML injection, Inject 1,000 mcg into the muscle every 30 (thirty) days. , Disp: , Rfl:    diclofenac Sodium (VOLTAREN) 1 % GEL, Apply 2 g topically 4 (four) times daily., Disp: 100 g, Rfl: 0   dicyclomine (BENTYL) 20 MG tablet, Take 1 tablet (20 mg total) by mouth every 8 (eight) hours as needed for spasms (AB pain)., Disp: 30 tablet, Rfl: 0   glucose blood (ACCU-CHEK GUIDE) test strip, Use to test blood sugar 3 times daily, Disp: 100 each, Rfl: 2   levothyroxine (SYNTHROID) 50 MCG tablet, Take 1 tablet (50 mcg total) by mouth daily before breakfast., Disp: 90 tablet, Rfl: 3   MAGNESIUM CITRATE PO, Take 1 tablet by mouth 2 (two) times daily., Disp: , Rfl:    mirtazapine (REMERON) 15 MG tablet, Take 1 tablet (15 mg total) by mouth at bedtime., Disp: 30 tablet, Rfl: 0   Multiple Vitamin (MULTIVITAMIN WITH MINERALS) TABS tablet, Take 1 tablet by mouth daily., Disp: 30 tablet, Rfl: 0   Na Sulfate-K Sulfate-Mg Sulf 17.5-3.13-1.6 GM/177ML SOLN, , Disp: , Rfl:    oxyCODONE (OXY IR/ROXICODONE) 5 MG immediate release tablet, Take 1 tablet (5 mg total) by mouth every 6 (six) hours as needed for moderate pain., Disp: 15 tablet, Rfl: 0   pregabalin (LYRICA) 50 MG capsule, Take 1 capsule (50 mg total) by mouth daily as needed (for pain)., Disp: 90 capsule, Rfl: 3   REPATHA SURECLICK 140 MG/ML SOAJ, INJECT 1 PEN SUBCUTANEOUSLY EVERY 14 DAYS, Disp: 6 mL, Rfl: 3   SYSTANE HYDRATION PF 0.4-0.3 % SOLN, Place 1-2 drops into both eyes in the morning., Disp: , Rfl:    traZODone (DESYREL) 50 MG tablet, Take 1 tablet (50 mg total) by mouth at bedtime., Disp: 90 tablet, Rfl: 3   eplerenone (INSPRA) 25 MG tablet, Take 1 tablet (25 mg total) by mouth daily., Disp: 30 tablet, Rfl: 2   febuxostat (ULORIC) 40 MG tablet, Take 1 tablet (40 mg total) by mouth daily., Disp: 30 tablet, Rfl: 2   pantoprazole (PROTONIX) 40 MG tablet, Take 40 mg by mouth daily. (Patient not taking: Reported on  07/24/2023), Disp: , Rfl:   Current Facility-Administered Medications:    cyanocobalamin (VITAMIN B12) injection 1,000 mcg, 1,000 mcg, Intramuscular, Q30 days, Jeani Sow, MD, 1,000 mcg at 06/04/23 1354  Allergies  Allergen Reactions   Atenolol Other (See Comments)    Stomach problems, chills  Atorvastatin Other (See Comments)    Retained fluid   Canagliflozin Other (See Comments)    ALL DIABETIC MEDS PER PT   abdominal pain, headache   Losartan Potassium Other (See Comments)    chills, HA, stomach pain   Acarbose Nausea Only and Other (See Comments)     headache,nausea   Cinnamon Other (See Comments)    Stomach pain   Empagliflozin Other (See Comments)     headaches   Glimepiride Other (See Comments)    ALL DIABETIC MEDS PER PT  abdominal pain, headache   Metformin And Related Nausea And Vomiting and Other (See Comments)    Patient states that she has severe chills, headache and cramping additionally. Says blood sugar is uncontrolled because of this   Other Other (See Comments)    ALL DIABETIC MEDS PER PT    Pioglitazone Other (See Comments)    ALL DIABETIC MEDS PER PT = peripheral edema   Pravastatin Other (See Comments)    Retained fluid   Sitagliptin Other (See Comments)    ALL DIABETIC MEDS PER PT  Other reaction(s): headache   Spironolactone-Hctz Other (See Comments)   Statins Other (See Comments)    Myalgia   Sulfamethoxazole Other (See Comments)    ALL DIABETIC MEDS PER PT    Tramadol Hcl Itching   Atorvastatin Calcium Other (See Comments)     retain fld   Augmentin [Amoxicillin-Pot Clavulanate] Other (See Comments)    Caused jaundice   Crestor [Rosuvastatin] Other (See Comments)   Rosuvastatin Calcium    Torsemide Nausea Only    Gi upset, chills, abd pain,bloating   Bumex [Bumetanide] Rash and Other (See Comments)    Chills, rash, headaches, stomach issues also   Lasix [Furosemide] Rash and Other (See Comments)    Sugars increased   Sulfa  Antibiotics Rash and Other (See Comments)    Mother has told patient in the past not to take, she cannot recall there reaction   ROS neg/noncontributory except as noted HPI/below      Objective:     BP (!) 162/80 (Patient Position: Sitting, Cuff Size: Large)   Pulse 90   Temp 98.3 F (36.8 C) (Temporal)   Resp 18   Ht 5\' 2"  (1.575 m)   Wt 197 lb 2 oz (89.4 kg)   SpO2 99%   BMI 36.05 kg/m  Wt Readings from Last 3 Encounters:  07/24/23 197 lb 2 oz (89.4 kg)  07/17/23 193 lb 6.1 oz (87.7 kg)  07/02/23 193 lb 2 oz (87.6 kg)    Physical Exam   Gen: WDWN NAD HEENT: NCAT, conjunctiva not injected, sclera nonicteric CARDIAC: RRR, S1S2+, no murmur. DP 1+B LUNGS: CTAB. No wheezes ABDOMEN:  BS+, soft, NTND, No HSM, no masses EXT:  right LE edema +1, L 1/2+ MSK: no gross abnormalities.  NEURO: A&O x3.  CN II-XII intact.  PSYCH: normal mood. Good eye contact     Assessment & Plan:  Acute idiopathic gout of right foot -     Febuxostat; Take 1 tablet (40 mg total) by mouth daily.  Dispense: 30 tablet; Refill: 2  Local edema -     Eplerenone; Take 1 tablet (25 mg total) by mouth daily.  Dispense: 30 tablet; Refill: 2  Essential hypertension -     Eplerenone; Take 1 tablet (25 mg total) by mouth daily.  Dispense: 30 tablet; Refill: 2  Stress at home  Anemia, unspecified type  1.  Gout-right foot and right hand.  Chronic.  Somewhat worse today, but she has been off of the Uloric.  Has a lot of sensitivities to medications.  Uric acid was at goal while on the medicine.  Advised to restart. 2.  Localized edema-usually better with diuretics, however she has had multiple drug allergies/sensitivities.  No longer tolerating Bumex, Lasix, torsemide, HCTZ, spironolactone.  Has restarted Inspra 25 mg.  Will monitor. 3.  Elevated blood pressures-patient thinks due to stress today.  Will monitor.  She is coming in in 2 days with her daughter, we will check her blood pressure then. 4.   Anemia-seems to have started after she had acute jaundice thought to be due to Augmentin.  Iron levels are low.  She cannot tolerate p.o. iron.  She has concerns about getting a colonoscopy/endoscopy due to all of her comorbid problems.  She has an appointment in November for GI to discuss this.  She does have an appoint with oncology for evaluation of the anemia and consideration of IV iron versus watchful waiting. 5.  Stress at home-grown daughter has paranoid schizoaffective disorder.  Granddaughter and her boyfriend live with them.  Everybody is having problems with coughing.  Air conditioners are very moldy.  She does not have anybody to replace them.  Patient's chronic illness is also a major stressor.  Return in about 4 weeks (around 08/21/2023) for chronic follow-up.   Germaine Pomfret Rice,acting as a scribe for Angelena Sole, MD.,have documented all relevant documentation on the behalf of Angelena Sole, MD,as directed by  Angelena Sole, MD while in the presence of Angelena Sole, MD.  I, Angelena Sole, MD, have reviewed all documentation for this visit. The documentation on 07/24/23 for the exam, diagnosis, procedures, and orders are all accurate and complete.  Angelena Sole, MD

## 2023-07-29 NOTE — Progress Notes (Unsigned)
HISTORY AND PHYSICAL     CC:  follow up. Requesting Provider:  Jeani Sow, MD  HPI: This is a 72 y.o. female here for follow up for carotid artery stenosis.   She was seen in consultation in the hospital in November 2021.  She had undergone a GYN procedure and a few days later was noted to have numbness and tingling and swelling on the left side of her face and eye.  She had a full evaluation including an MRI of the brain which showed no stroke CT scan of the head which showed no bleed carotid duplex scan and CT angio of the neck.    She had not had any episodes of weakness in the upper or lower extremities.  She had not had any episodes of amaurosis.  She had no prior similar events.  She stated that she did have a lot of stress in her life at the time.  She has been evaluated in the past for arrhythmia by cardiology.     Pt was last seen 07/31/2022 and at that time she was not having any neurological sx.  She did have a fall in February 2023 and fractured some vertebrae.  She had progressive loss of fine motor skills and short term memory loss over the years. 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  Her current neurologist had considered it but still wasn't sure.   Pt returns today for follow up.    Pt denies any amaurosis fugax, speech difficulties, weakness, numbness, paralysis or clumsiness or facial droop.    She states that she has had a very hard year with hospitalization with jaundice from liver issues from medication. She states that she was also told she had gout in the right foot with significant pain and increased uric acid.  She states that she also has been diagnosed with anemia and was supposed to have endoscopy and colonoscopy tomorrow but did not feel that she was in good enough shape for this.  She states she has had some shortness of breath and was found to have some black mold within her air conditioner.  She has not  seen a pulmonologist.   She states that she has trouble with directions/memory since getting older.  She has not had temporary blindness but has cataracts and been diagnosed with macular degeneration.  She states that she has leg swelling and is retaining water all over.  She does have elevated blood pressure today but has had many allergies to antihypertensive medications and diuretics.  She states that her mother had hx of carotid disease and had surgery.    The pt is on a statin for cholesterol management. (Repatha) The pt is not on a daily aspirin.   Other AC:  none The pt is not on medication for hypertension.   The pt is not on medication for diabetes Tobacco hx:  never   Past Medical History:  Diagnosis Date   Allergy    Anemia    Arthritis    Asthma    Dianosed years ago-no meds at this time   Cancer [facility]) 2021   endometrial    Cataract    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 name of MD    Dementia (HCC)    Diabetes mellitus  type 2    Diverticulosis    Dyslipidemia    Dyspnea    with exertion    Dysrhythmia    "skips beats"   Endometrial cancer (HCC) 2021   Family history of adverse reaction to anesthesia    son stopped breathing during surgery , mother slow to wake up    Fibromyalgia 1980's   Generalized anxiety disorder    GERD (gastroesophageal reflux disease)    occ- will use Tums and Prolosec  prn    Heart murmur    Hyperlipemia    Hypertension    not on blood pressure meds due to allergies - last dose of meds- months ago    Hypothyroidism    Hypothyroidism (acquired)    Internal hemorrhoids    Irritable bowel syndrome 1980's   Liver cancer (HCC)    Major depressive disorder    Migraine headaches 1980's   On meds-well controlled   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   Morbid obesity (HCC)    Pernicious anemia     Pneumonia 2009   Several times over the past several years   PONV (postoperative nausea and vomiting)    was told by neurologist due to calcificaton in brain not to be put to sleep, N/V during Op and Recovery    Recurrent upper respiratory infection (URI)    Sleep apnea    no cpap   Subarachnoid hemorrhage (HCC) 2010   Vaginal polyp    benign per pt    Past Surgical History:  Procedure Laterality Date   CATARACT EXTRACTION Right    CHOLECYSTECTOMY  1983   COLONOSCOPY  2008   DB   DILATION AND CURETTAGE OF UTERUS  1972   HYSTEROSCOPY WITH D & C  06/29/2011   Procedure: DILATATION AND CURETTAGE (D&C) /HYSTEROSCOPY;  Surgeon: Reva Bores, MD;  Location: [facility];  Service: Gynecology;  Laterality: N/A;   ROBOTIC ASSISTED TOTAL HYSTERECTOMY WITH BILATERAL SALPINGO OOPHERECTOMY N/A 10/27/2020   Procedure: XI ROBOTIC ASSISTED TOTAL HYSTERECTOMY WITH BILATERAL SALPINGO OOPHORECTOMY;  Surgeon: Adolphus Birchwood, MD;  Location: [facility];  Service: Gynecology;  Laterality: N/A;   SENTINEL NODE BIOPSY N/A 10/27/2020   Procedure: SENTINEL NODE BIOPSY;  Surgeon: Adolphus Birchwood, MD;  Location: [facility];  Service: Gynecology;  Laterality: N/A;    Allergies  Allergen Reactions   Atenolol Other (See Comments)    Stomach problems, chills   Atorvastatin Other (See Comments)    Retained fluid   Canagliflozin Other (See Comments)    ALL DIABETIC MEDS PER PT   abdominal pain, headache   Losartan Potassium Other (See Comments)    chills, HA, stomach pain   Acarbose Nausea Only and Other (See Comments)     headache,nausea   Cinnamon Other (See Comments)    Stomach pain   Empagliflozin Other (See Comments)     headaches   Glimepiride Other (See Comments)    ALL DIABETIC MEDS PER PT  abdominal pain, headache   Metformin And Related Nausea And Vomiting and Other (See Comments)    Patient states that she has severe chills, headache and cramping additionally. Says blood sugar is uncontrolled because of this    Other Other (See Comments)    ALL DIABETIC MEDS PER PT    Pioglitazone Other (See Comments)    ALL DIABETIC MEDS PER PT = peripheral edema   Pravastatin Other (See Comments)    Retained fluid   Sitagliptin Other (See Comments)    ALL DIABETIC MEDS PER  PT  Other reaction(s): headache   Spironolactone-Hctz Other (See Comments)   Statins Other (See Comments)    Myalgia   Sulfamethoxazole Other (See Comments)    ALL DIABETIC MEDS PER PT    Tramadol Hcl Itching   Atorvastatin Calcium Other (See Comments)     retain fld   Augmentin [Amoxicillin-Pot Clavulanate] Other (See Comments)    Caused jaundice   Crestor [Rosuvastatin] Other (See Comments)   Rosuvastatin Calcium    Torsemide Nausea Only    Gi upset, chills, abd pain,bloating   Bumex [Bumetanide] Rash and Other (See Comments)    Chills, rash, headaches, stomach issues also   Lasix [Furosemide] Rash and Other (See Comments)    Sugars increased   Sulfa Antibiotics Rash and Other (See Comments)    Mother has told patient in the past not to take, she cannot recall there reaction    Current Outpatient Medications  Medication Sig Dispense Refill   Cholecalciferol (VITAMIN D3) 25 MCG (1000 UT) capsule Take 1,000 Units by mouth daily.     cyanocobalamin (,VITAMIN B-12,) 1000 MCG/ML injection Inject 1,000 mcg into the muscle every 30 (thirty) days.      diclofenac Sodium (VOLTAREN) 1 % GEL Apply 2 g topically 4 (four) times daily. 100 g 0   dicyclomine (BENTYL) 20 MG tablet Take 1 tablet (20 mg total) by mouth every 8 (eight) hours as needed for spasms (AB pain). 30 tablet 0   eplerenone (INSPRA) 25 MG tablet Take 1 tablet (25 mg total) by mouth daily. 30 tablet 2   febuxostat (ULORIC) 40 MG tablet Take 1 tablet (40 mg total) by mouth daily. 30 tablet 2   glucose blood (ACCU-CHEK GUIDE) test strip Use to test blood sugar 3 times daily 100 each 2   levothyroxine (SYNTHROID) 50 MCG tablet Take 1 tablet (50 mcg total) by mouth daily before  breakfast. 90 tablet 3   MAGNESIUM CITRATE PO Take 1 tablet by mouth 2 (two) times daily.     mirtazapine (REMERON) 15 MG tablet Take 1 tablet (15 mg total) by mouth at bedtime. 30 tablet 0   Multiple Vitamin (MULTIVITAMIN WITH MINERALS) TABS tablet Take 1 tablet by mouth daily. 30 tablet 0   Na Sulfate-K Sulfate-Mg Sulf 17.5-3.13-1.6 GM/177ML SOLN      oxyCODONE (OXY IR/ROXICODONE) 5 MG immediate release tablet Take 1 tablet (5 mg total) by mouth every 6 (six) hours as needed for moderate pain. 15 tablet 0   pantoprazole (PROTONIX) 40 MG tablet Take 40 mg by mouth daily. (Patient not taking: Reported on 07/24/2023)     pregabalin (LYRICA) 50 MG capsule Take 1 capsule (50 mg total) by mouth daily as needed (for pain). 90 capsule 3   REPATHA SURECLICK 140 MG/ML SOAJ INJECT 1 PEN SUBCUTANEOUSLY EVERY 14 DAYS 6 mL 3   SYSTANE HYDRATION PF 0.4-0.3 % SOLN Place 1-2 drops into both eyes in the morning.     traZODone (DESYREL) 50 MG tablet Take 1 tablet (50 mg total) by mouth at bedtime. 90 tablet 3   Current Facility-Administered Medications  Medication Dose Route Frequency Provider Last Rate Last Admin   cyanocobalamin (VITAMIN B12) injection 1,000 mcg  1,000 mcg Intramuscular Q30 days Jeani Sow, MD   1,000 mcg at 06/04/23 1354    Family History  Problem Relation Age of Onset   Anesthesia problems Mother        hard to wake post op    Cerebrovascular Disease Mother  Never had stroke, but had CEA.   Liver cancer Father        mets to liver    Bladder Cancer Father    Colon cancer Father        mets to colon    Schizophrenia Daughter        Schizoaffective disorder   Anesthesia problems Son        stopped breathing post op    Tremor Son    Anesthesia problems Granddaughter        PONV   High blood pressure Other    Diabetes Other    Arthritis Other    Schizophrenia Child    Tremor Child    Dementia Neg Hx    Esophageal cancer Neg Hx    Rectal cancer Neg Hx    Stomach  cancer Neg Hx    Colon polyps Neg Hx     Social History   Socioeconomic History   Marital status: Legally Separated    Spouse name: Not on file   Number of children: 3   Years of education: 12   Highest education level: Not on file  Occupational History   Occupation: Retired     Comment: nanny   Occupation: retired  Tobacco Use   Smoking status: Never   Smokeless tobacco: Never  Vaping Use   Vaping status: Never Used  Substance and Sexual Activity   Alcohol use: Not Currently    Comment: quit 1978   Drug use: Never   Sexual activity: Not Currently  Other Topics Concern   Not on file  Social History Narrative    Lives at home. Her granddaughter lives with her and daughter    Right handed   Social Determinants of Health   Financial Resource Strain: Not on file  Food Insecurity: Not on file  Transportation Needs: Not on file  Physical Activity: Not on file  Stress: Not on file  Social Connections: Not on file  Intimate Partner Violence: Not on file     REVIEW OF SYSTEMS:   [X]  denotes positive finding, [ ]  denotes negative finding Cardiac  Comments:  Chest pain or chest pressure:    Shortness of breath upon exertion: x   Short of breath when lying flat: x   Irregular heart rhythm: x       Vascular    Pain in calf, thigh, or hip brought on by ambulation:    Pain in feet at night that wakes you up from your sleep:  x   Blood clot in your veins:    Leg swelling:  x       Pulmonary    Oxygen at home:    Productive cough:     Wheezing:  x       Neurologic    Sudden weakness in arms or legs:     Sudden numbness in arms or legs:     Sudden onset of difficulty speaking or slurred speech:    Temporary loss of vision in one eye:     Problems with dizziness:         Gastrointestinal    Blood in stool:     Vomited blood:         Genitourinary    Burning when urinating:     Blood in urine:        Psychiatric    Major depression:  x       Hematologic     Bleeding problems:  Problems with blood clotting too easily:        Skin    Rashes or ulcers:        Constitutional    Fever or chills:      PHYSICAL EXAMINATION:  Today's Vitals   07/31/23 1324 07/31/23 1326 07/31/23 1328  BP: (!) 191/112 (!) 183/105   Pulse: (!) 101    Resp: 18    Temp: 98.2 F (36.8 C)    TempSrc: Temporal    SpO2: 99%    Weight: 198 lb 1.6 oz (89.9 kg)    Height: 5\' 2"  (1.575 m)    PainSc:   5    Body mass index is 36.23 kg/m.   General:  WDWN in NAD; vital signs documented above Gait: Not observed HENT: WNL, normocephalic Pulmonary: normal non-labored breathing Cardiac: regular HR, without carotid bruits Abdomen: soft, NT; aortic pulse is not palpable Skin: without rashes Vascular Exam/Pulses:  Right Left  Radial 2+ (normal) 2+ (normal)  DP 2+ (normal) 2+ (normal)   Extremities: without open wounds; +BLE swelling with pitting from her shoes bilaterally Musculoskeletal: no muscle wasting or atrophy  Neurologic: A&O X 3; moving all extremities equally; speech is fluent/normal Psychiatric:  The pt has Normal affect.   Non-Invasive Vascular Imaging:   Carotid Duplex on 07/31/2023 Right:  60-79% ICA stenosis Left:  1-39% ICA stenosis   Previous Carotid duplex on 07/31/2022: Right: 40-59% ICA stenosis Left:   1-39% ICA stenosis    ASSESSMENT/PLAN:: 72 y.o. female here for follow up carotid artery stenosis   -duplex today reveals that the right ICA stenosis has jumped to the 60-79% but is in the low range of this as it was 40-59% previously but in the high range.  She remains asymptomatic.  Discussed with pt that overall, this is essentially unchanged and will check this again in 6 months.  -discussed s/s of stroke with pt and she understands should she develop any of these sx, she will go to the nearest ER or call 911. -pt will f/u in 6 months with carotid duplex -her BP was elevated today, but looking back at recent appts, it has not  been this elevated.  Pt was feeling overwhelmed with her recent medical issues.  -pt will call sooner should she have any issues. -continue Repatha.  She has been off of her asa.  Given her recent diagnosis of asa, she may need to be off of this until she has her colonoscopy/EGD to determine source.  Will defer to GI for further evaluation.  -she recently had right foot pain that was determined to be gout.  She has easily palpable DP pulses bilaterally.  -if she continues to have wheezing/sob, may need referral to pulmonology but will defer to her PCP.  Her O2 saturation was 99% today.    Doreatha Massed, [facility] [phone]  Clinic MD:  Randie Heinz

## 2023-07-31 ENCOUNTER — Telehealth: Payer: Self-pay | Admitting: Family Medicine

## 2023-07-31 ENCOUNTER — Encounter: Payer: Self-pay | Admitting: Physician Assistant

## 2023-07-31 ENCOUNTER — Ambulatory Visit: Payer: 59 | Admitting: Physician Assistant

## 2023-07-31 ENCOUNTER — Ambulatory Visit (HOSPITAL_COMMUNITY)
Admission: RE | Admit: 2023-07-31 | Discharge: 2023-07-31 | Disposition: A | Discharge status: Home / Self Care | Payer: 59 | Source: Ambulatory Visit | Attending: Vascular Surgery | Admitting: Vascular Surgery

## 2023-07-31 VITALS — BP 183/105 | HR 101 | Temp 98.2°F | Resp 18 | Ht 62.0 in | Wt 198.1 lb

## 2023-07-31 DIAGNOSIS — I6523 Occlusion and stenosis of bilateral carotid arteries: Secondary | ICD-10-CM | POA: Diagnosis not present

## 2023-07-31 NOTE — Telephone Encounter (Signed)
FYI: This call has been transferred to triage nurse: Access Nurse. Once the result note has been entered staff can address the message at that time.  Patient called in with the following symptoms:  Red Word:elevated blood pressure-183/105 @ Vein and vascular appointment    Please advise at Mobile [phone] (mobile)  Message is routed to Provider Pool.

## 2023-07-31 NOTE — Telephone Encounter (Signed)
Final Outcome: Go to ED Now. Per note, patient refused for back line to be reached and hung up call.     Patient Name First: Isabella Sledge Last: [name] Gender: Female DOB: May 22, 1951 Age: 72 Y 9 M 20 D Return Phone Number: [phone] (Primary) Address: City/ State/ Zip: [place]  [address] Client [facility] [employer] [facility] Provider Ruthine Dose, Ann Contact Type Call Who Is Calling Patient / Member / Family / Caregiver Call Type Triage / Clinical Relationship To Patient Self Return Phone Number [phone] (Primary) Chief Complaint Blood Pressure High Reason for Call Symptomatic / Request for Health Information Initial Comment Caller states her blood pressure was 191/112 183/105 Translation No Nurse Assessment Nurse: Hipolito Bayley, RN, Marjean Donna Date/Time (Eastern Time): 07/31/2023 3:23:54 PM Confirm and document reason for call. If symptomatic, describe symptoms. ---Caller states she was having an [name] for carotid artery. Caller states she was put on a new BP medication and thinks she may be allergic to it. Caller states BP was 181 over something and 191/115 Does the patient have any new or worsening symptoms? ---Yes Will a triage be completed? ---Yes Related visit to physician within the last 2 weeks? ---Yes Does the PT have any chronic conditions? (i.e. diabetes, asthma, this includes High risk factors for pregnancy, etc.) ---Yes List chronic conditions. ---HTN, High uric acid, High choline Is this a behavioral health or substance abuse call? ---No Guidelines Guideline Title Affirmed Question Affirmed Notes Nurse Date/Time (Eastern Time) Blood Pressure - High Systolic BP >= 180 OR Diastolic >= 110 Andreas Ohm 07/31/2023 3:27:44 PM Disp. Time Lamount Cohen Time) Disposition Final User 07/31/2023 2:54:21 PM Attempt made - message left Clarita Leber, RN, Gavin Pound 07/31/2023 3:11:15 PM Attempt made - no  message left Womble, RN, [employer]. Time Lamount Cohen Time) Disposition Final User 07/31/2023 3:13:35 PM Send To RN Personal Clarita Leber, RN, Gavin Pound 07/31/2023 3:40:11 PM Go to ED Now Yes Hipolito Bayley, RN, Marjean Donna Final Disposition 07/31/2023 3:40:11 PM Go to ED Now Yes Hipolito Bayley, RN, Marjean Donna Disposition Overriden: See PCP within 24 Hours Override Reason: Patient's symptoms need a higher level of care Caller Disagree/Comply Disagree Caller Understands Yes PreDisposition Call Doctor Care Advice Given Per Guideline GO TO ED NOW: * You need to be seen in the Emergency Department. CALL EMS 911 IF: * Passes out or faints * Becomes confused * Becomes too weak to stand * You become worse CARE ADVICE given per High Blood Pressure (Adult) guideline. Comments User: Molli Hazard, RN Date/Time (Eastern Time): 07/31/2023 3:26:10 PM Caller states she was in the hospitl and has allergies to augmentin. User: Molli Hazard, RN Date/Time Lamount Cohen Time): 07/31/2023 3:29:19 PM Caller states at night she has problems breathing and wheezing when laying down. Caller states she does not have trouble breathing. User: Molli Hazard, RN Date/Time Lamount Cohen Time): 07/31/2023 3:29:55 PM Caller states she has pain in foot and right hand. User: Molli Hazard, RN Date/Time Lamount Cohen Time): 07/31/2023 3:32:12 PM Caller states last BP was taken 2 hours ago and first BP was 191/115 and second BP was 183/105. Caller states she is under a lot of stress. User: Molli Hazard, RN Date/Time Lamount Cohen Time): 07/31/2023 3:33:44 PM Caller states she has macular degeneration. Caller states she has a headache rated 4/10. User: Molli Hazard, RN Date/Time Lamount Cohen Time): 07/31/2023 3:35:47 PM Caller states she was supposed to have a colonoscopy and endoscopy tomorrow caller states she has not had an appointment. User: Molli Hazard, RN  Date/Time Lamount Cohen Time): 07/31/2023 3:37:04 PM Caller states she white coat syndrome. User: Molli Hazard, RN Date/Time Lamount Cohen Time): 07/31/2023 3:38:21 PM Caller states her abdomen hurts rated 3/10. User: Molli Hazard, RN Date/Time Lamount Cohen Time): 07/31/2023 3:43:48 PM Comments Caller refused for back line to be reached. Caller stated she is very dissatisfied with medical field after covid. Caller hung up. Referrals GO TO FACILITY REFUSED

## 2023-07-31 NOTE — Telephone Encounter (Signed)
ok 

## 2023-08-01 ENCOUNTER — Other Ambulatory Visit: Payer: Self-pay | Admitting: *Deleted

## 2023-08-01 ENCOUNTER — Encounter: Payer: 59 | Admitting: Gastroenterology

## 2023-08-01 DIAGNOSIS — I10 Essential (primary) hypertension: Secondary | ICD-10-CM

## 2023-08-01 NOTE — Telephone Encounter (Signed)
Urgent referral placed. Patient stated that she has not checked her blood pressure today, but she is doing okay today. Patient informed that referral has been placed and she will get a call from th [facility] clinic to schedule an appointment.

## 2023-08-01 NOTE — Telephone Encounter (Signed)
PCP aware of message below, was standing at desk while talking to patient.

## 2023-08-02 ENCOUNTER — Other Ambulatory Visit: Payer: Self-pay

## 2023-08-02 DIAGNOSIS — I6523 Occlusion and stenosis of bilateral carotid arteries: Secondary | ICD-10-CM

## 2023-08-05 ENCOUNTER — Other Ambulatory Visit: Payer: Self-pay | Admitting: Family Medicine

## 2023-08-07 ENCOUNTER — Other Ambulatory Visit: Payer: Self-pay | Admitting: Family Medicine

## 2023-08-07 NOTE — Telephone Encounter (Signed)
Please call pt and verify not taking since not on diuretic.  Pharm keeps requesting

## 2023-08-08 ENCOUNTER — Inpatient Hospital Stay: Payer: 59

## 2023-08-08 ENCOUNTER — Inpatient Hospital Stay: Payer: 59 | Attending: Nurse Practitioner | Admitting: Nurse Practitioner

## 2023-08-08 VITALS — BP 168/97 | HR 108 | Temp 97.9°F | Resp 17 | Wt 197.2 lb

## 2023-08-08 DIAGNOSIS — Z79899 Other long term (current) drug therapy: Secondary | ICD-10-CM | POA: Diagnosis not present

## 2023-08-08 DIAGNOSIS — D509 Iron deficiency anemia, unspecified: Secondary | ICD-10-CM | POA: Diagnosis not present

## 2023-08-08 DIAGNOSIS — Z808 Family history of malignant neoplasm of other organs or systems: Secondary | ICD-10-CM | POA: Diagnosis not present

## 2023-08-08 DIAGNOSIS — I129 Hypertensive chronic kidney disease with stage 1 through stage 4 chronic kidney disease, or unspecified chronic kidney disease: Secondary | ICD-10-CM | POA: Insufficient documentation

## 2023-08-08 DIAGNOSIS — Z86010 Personal history of colonic polyps: Secondary | ICD-10-CM | POA: Diagnosis not present

## 2023-08-08 DIAGNOSIS — N189 Chronic kidney disease, unspecified: Secondary | ICD-10-CM | POA: Diagnosis not present

## 2023-08-08 DIAGNOSIS — E1159 Type 2 diabetes mellitus with other circulatory complications: Secondary | ICD-10-CM | POA: Insufficient documentation

## 2023-08-08 DIAGNOSIS — Z800 Family history of malignant neoplasm of digestive organs: Secondary | ICD-10-CM | POA: Insufficient documentation

## 2023-08-08 DIAGNOSIS — C541 Malignant neoplasm of endometrium: Secondary | ICD-10-CM | POA: Diagnosis not present

## 2023-08-08 LAB — CMP (CANCER CENTER ONLY)
ALT: 9 U/L (ref 0–44)
AST: 15 U/L (ref 15–41)
Albumin: 4 g/dL (ref 3.5–5.0)
Alkaline Phosphatase: 57 U/L (ref 38–126)
Anion gap: 7 (ref 5–15)
BUN: 21 mg/dL (ref 8–23)
CO2: 28 mmol/L (ref 22–32)
Calcium: 9.3 mg/dL (ref 8.9–10.3)
Chloride: 107 mmol/L (ref 98–111)
Creatinine: 1.25 mg/dL — ABNORMAL HIGH (ref 0.44–1.00)
GFR, Estimated: 46 mL/min — ABNORMAL LOW (ref >60)
Glucose, Bld: 158 mg/dL — ABNORMAL HIGH (ref 70–99)
Potassium: 4.3 mmol/L (ref 3.5–5.1)
Sodium: 142 mmol/L (ref 135–145)
Total Bilirubin: 0.6 mg/dL (ref 0.3–1.2)
Total Protein: 7 g/dL (ref 6.5–8.1)

## 2023-08-08 LAB — IRON AND IRON BINDING CAPACITY (CC-WL,HP ONLY)
Iron: 69 ug/dL (ref 28–170)
Saturation Ratios: 20 % (ref 10.4–31.8)
TIBC: 354 ug/dL (ref 250–450)
UIBC: 285 ug/dL (ref 148–442)

## 2023-08-08 LAB — CBC WITH DIFFERENTIAL (CANCER CENTER ONLY)
Abs Immature Granulocytes: 0.01 10*3/uL (ref 0.00–0.07)
Basophils Absolute: 0 10*3/uL (ref 0.0–0.1)
Basophils Relative: 1 %
Eosinophils Absolute: 0.1 10*3/uL (ref 0.0–0.5)
Eosinophils Relative: 3 %
HCT: 33.9 % — ABNORMAL LOW (ref 36.0–46.0)
Hemoglobin: 11.1 g/dL — ABNORMAL LOW (ref 12.0–15.0)
Immature Granulocytes: 0 %
Lymphocytes Relative: 28 %
Lymphs Abs: 1.1 10*3/uL (ref 0.7–4.0)
MCH: 30 pg (ref 26.0–34.0)
MCHC: 32.7 g/dL (ref 30.0–36.0)
MCV: 91.6 fL (ref 80.0–100.0)
Monocytes Absolute: 0.4 10*3/uL (ref 0.1–1.0)
Monocytes Relative: 10 %
Neutro Abs: 2.3 10*3/uL (ref 1.7–7.7)
Neutrophils Relative %: 58 %
Platelet Count: 191 10*3/uL (ref 150–400)
RBC: 3.7 MIL/uL — ABNORMAL LOW (ref 3.87–5.11)
RDW: 13.9 % (ref 11.5–15.5)
WBC Count: 3.9 10*3/uL — ABNORMAL LOW (ref 4.0–10.5)
nRBC: 0 % (ref 0.0–0.2)

## 2023-08-08 LAB — VITAMIN B12: Vitamin B-12: 686 pg/mL (ref 180–914)

## 2023-08-08 LAB — RETIC PANEL
Immature Retic Fract: 6.8 % (ref 2.3–15.9)
RBC.: 3.72 MIL/uL — ABNORMAL LOW (ref 3.87–5.11)
Retic Count, Absolute: 75.5 10*3/uL (ref 19.0–186.0)
Retic Ct Pct: 2 % (ref 0.4–3.1)
Reticulocyte Hemoglobin: 31.4 pg (ref >27.9)

## 2023-08-08 NOTE — Progress Notes (Addendum)
[facility]   [phone] [phone]   Clinic New consult Note   Patient Care Team: Jeani Sow, MD as PCP - General (Family Medicine) Corky Crafts, MD as PCP - Cardiology (Cardiology) Allwardt, Milus Mallick (Physician Assistant) 08/11/2023  CHIEF COMPLAINTS/PURPOSE OF CONSULTATION:  Anemia  ASSESSMENT & PLAN:  [name] is a 72 y.o. female with a history of endometrial cancer, hypertension, Type 2 diabetes, and diverticulitis, who is now having iron deficiency anemia of chronic disease.   1. Anemia Anemia noted on labs done per  PCP and other specialists since 03/2023. Has been getting monthly B12 injections without much improvement in Hgb or Hct.  States that she has been feeling more fatigued, has some shortness of breath, and has been getting some dizziness in recent months.  She has history  of endometrial cancer, stage 1A. She has had complete hysterectomy as a result. A CT abdomen and pelvis was done in 03/2023 due to  diverticulitis, which showed no evidence of malignancy or new disease.    PLAN: Check labs to evaluate anemia further. Check CBC with diff, CMP, ferritin, iron, TIBC, vitamin b12 and reculocyte panel.  -notify patient of results when available.  -recheck labs in 3 months.  -labs and follow up appointment should be set up for 6 months.    HISTORY OF PRESENTING ILLNESS:  [name] 72 y.o. female is here because of iron deficiency anemia of chronic disease.  She was found to have abnormal CBC from 03/2023 She denies recent chest pain on exertion,  pre-syncopal episodes, or palpitations. She does report  some pre-syncopal episodes when moving too quickly.  She had not noticed any recent bleeding such as epistaxis, hematuria or hematochezia The patient denies over the counter NSAID ingestion. She is not  on antiplatelets agents. Her last colonoscopy was 06/03/2018 done per Dr. Lavon Paganini at [facility]. She had 2 small polys which were removed in the transverse and ascending colon, moderate  diverticulosis throughout the colon with evidence of impacted diverticulum. There were non-bleeding, internal hemorrhoids.  She has prior history of endometrial cancer, stage 1A. She has had complete hysterectomy. Her age appropriate screening programs are up-to-date. She denies any pica and eats a variety of diet. She never donated blood or received blood transfusion The patient was prescribed oral iron supplements but does not take them because they upset her stomach.    REVIEW OF SYSTEMS:   Constitutional: Denies fevers, chills or abnormal night sweats. Reports increased fatigue.  Eyes: Denies blurriness of vision, double vision or watery eyes Ears, nose, mouth, throat, and face: Denies mucositis or sore throat Respiratory: Denies cough, dyspnea or wheezes Cardiovascular: Denies palpitation, chest discomfort or lower extremity swelling. She states that she does have shortness of breath, especially with exertion.  Gastrointestinal:  Denies nausea, heartburn or change in bowel habits Skin: Denies abnormal skin rashes Lymphatics: Denies new lymphadenopathy or easy bruising Neurological:Denies numbness, tingling or new weaknesses Behavioral/Psych: Mood is stable, no new changes   All other systems were reviewed with the patient and are negative.   MEDICAL HISTORY:  Past Medical History:  Diagnosis Date   Allergy    Anemia    Arthritis    Asthma    Dianosed years ago-no meds at this time   Cancer [facility]) 2021   endometrial    Cataract    Right eye removed-still has left cataract    Chronic kidney disease 1997  Nepthrotic syndrom with minimal change-in remission per pt - last seen by kidney MD- 2 mos ago ? name of MD    Dementia (HCC)    Diabetes mellitus    type 2    Diverticulosis    Dyslipidemia    Dyspnea    with exertion    Dysrhythmia    "skips beats"    Endometrial cancer (HCC) 2021   Family history of adverse reaction to anesthesia    son stopped breathing during surgery , mother slow to wake up    Fibromyalgia 1980's   Generalized anxiety disorder    GERD (gastroesophageal reflux disease)    occ- will use Tums and Prolosec  prn    Heart murmur    Hyperlipemia    Hypertension    not on blood pressure meds due to allergies - last dose of meds- months ago    Hypothyroidism    Hypothyroidism (acquired)    Internal hemorrhoids    Irritable bowel syndrome 1980's   Liver cancer (HCC)    Major depressive disorder    Migraine headaches 1980's   On meds-well controlled   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   Morbid obesity (HCC)    Pernicious anemia    Pneumonia 2009   Several times over the past several years   PONV (postoperative nausea and vomiting)    was told by neurologist due to calcificaton in brain not to be put to sleep, N/V during Op and Recovery    Recurrent upper respiratory infection (URI)    Sleep apnea    no cpap   Subarachnoid hemorrhage (HCC) 2010   Vaginal polyp    benign per pt    SURGICAL HISTORY: Past Surgical History:  Procedure Laterality Date   CATARACT EXTRACTION Right    CHOLECYSTECTOMY  1983   COLONOSCOPY  2008   DB   DILATION AND CURETTAGE OF UTERUS  1972   HYSTEROSCOPY WITH D & C  06/29/2011   Procedure: DILATATION AND CURETTAGE (D&C) /HYSTEROSCOPY;  Surgeon: Reva Bores, MD;  Location: [facility];  Service: Gynecology;  Laterality: N/A;   ROBOTIC ASSISTED TOTAL HYSTERECTOMY WITH BILATERAL SALPINGO OOPHERECTOMY N/A 10/27/2020   Procedure: XI ROBOTIC ASSISTED TOTAL HYSTERECTOMY WITH BILATERAL SALPINGO OOPHORECTOMY;  Surgeon: Adolphus Birchwood, MD;  Location: [facility];  Service: Gynecology;  Laterality: N/A;   SENTINEL NODE BIOPSY N/A 10/27/2020   Procedure: SENTINEL NODE BIOPSY;  Surgeon: Adolphus Birchwood, MD;  Location: [facility];  Service: Gynecology;  Laterality: N/A;    SOCIAL HISTORY: Social History    Socioeconomic History   Marital status: Legally Separated    Spouse name: Not on file   Number of children: 3   Years of education: 12   Highest education level: Not on file  Occupational History   Occupation: Retired     Comment: nanny   Occupation: retired  Tobacco Use   Smoking status: Never   Smokeless tobacco: Never  Vaping Use   Vaping status: Never Used  Substance and Sexual Activity   Alcohol use: Not Currently    Comment: quit 1978   Drug use: Never   Sexual activity: Not Currently  Other Topics Concern   Not on file  Social History Narrative    Lives at home. Her granddaughter lives with her and daughter    Right handed   Social Determinants of Health   Financial Resource Strain: Not on file  Food Insecurity: Not on file  Transportation Needs: Not on file  Physical  Activity: Not on file  Stress: Not on file  Social Connections: Not on file  Intimate Partner Violence: Not on file    FAMILY HISTORY: Family History  Problem Relation Age of Onset   Anesthesia problems Mother        hard to wake post op    Cerebrovascular Disease Mother        Never had stroke, but had CEA.   Liver cancer Father        mets to liver    Bladder Cancer Father    Colon cancer Father        mets to colon    Schizophrenia Daughter        Schizoaffective disorder   Anesthesia problems Son        stopped breathing post op    Tremor Son    Anesthesia problems Granddaughter        PONV   High blood pressure Other    Diabetes Other    Arthritis Other    Schizophrenia Child    Tremor Child    Dementia Neg Hx    Esophageal cancer Neg Hx    Rectal cancer Neg Hx    Stomach cancer Neg Hx    Colon polyps Neg Hx     ALLERGIES:  is allergic to atenolol, atorvastatin, canagliflozin, losartan potassium, acarbose, cinnamon, empagliflozin, glimepiride, metformin and related, other, pioglitazone, pravastatin, sitagliptin, spironolactone-hctz, statins, sulfamethoxazole, tramadol  hcl, atorvastatin calcium, augmentin [amoxicillin-pot clavulanate], crestor [rosuvastatin], rosuvastatin calcium, torsemide, bumex [bumetanide], lasix [furosemide], and sulfa antibiotics.  MEDICATIONS:  Current Outpatient Medications  Medication Sig Dispense Refill   Cholecalciferol (VITAMIN D3) 25 MCG (1000 UT) capsule Take 1,000 Units by mouth daily.     cyanocobalamin (,VITAMIN B-12,) 1000 MCG/ML injection Inject 1,000 mcg into the muscle every 30 (thirty) days.      diclofenac Sodium (VOLTAREN) 1 % GEL Apply 2 g topically 4 (four) times daily. 100 g 0   dicyclomine (BENTYL) 20 MG tablet Take 1 tablet (20 mg total) by mouth every 8 (eight) hours as needed for spasms (AB pain). 30 tablet 0   eplerenone (INSPRA) 25 MG tablet Take 1 tablet (25 mg total) by mouth daily. 30 tablet 2   febuxostat (ULORIC) 40 MG tablet Take 1 tablet (40 mg total) by mouth daily. 30 tablet 2   glucose blood (ACCU-CHEK GUIDE) test strip Use to test blood sugar 3 times daily 100 each 2   levothyroxine (SYNTHROID) 50 MCG tablet Take 1 tablet (50 mcg total) by mouth daily before breakfast. 90 tablet 3   MAGNESIUM CITRATE PO Take 1 tablet by mouth 2 (two) times daily.     mirtazapine (REMERON) 15 MG tablet Take 1 tablet (15 mg total) by mouth at bedtime. 30 tablet 0   Multiple Vitamin (MULTIVITAMIN WITH MINERALS) TABS tablet Take 1 tablet by mouth daily. 30 tablet 0   Na Sulfate-K Sulfate-Mg Sulf 17.5-3.13-1.6 GM/177ML SOLN      oxyCODONE (OXY IR/ROXICODONE) 5 MG immediate release tablet Take 1 tablet (5 mg total) by mouth every 6 (six) hours as needed for moderate pain. 15 tablet 0   pantoprazole (PROTONIX) 40 MG tablet Take 40 mg by mouth daily.     pregabalin (LYRICA) 50 MG capsule Take 1 capsule (50 mg total) by mouth daily as needed (for pain). 90 capsule 3   REPATHA SURECLICK 140 MG/ML SOAJ INJECT 1 PEN SUBCUTANEOUSLY EVERY 14 DAYS 6 mL 3   SYSTANE HYDRATION PF 0.4-0.3 % SOLN Place  1-2 drops into both eyes in the  morning.     traZODone (DESYREL) 50 MG tablet Take 1 tablet (50 mg total) by mouth at bedtime. 90 tablet 3   Current Facility-Administered Medications  Medication Dose Route Frequency Provider Last Rate Last Admin   cyanocobalamin (VITAMIN B12) injection 1,000 mcg  1,000 mcg Intramuscular Q30 days Jeani Sow, MD   1,000 mcg at 06/04/23 1354    PHYSICAL EXAMINATION: ECOG PERFORMANCE STATUS: 1 - Symptomatic but completely ambulatory  Vitals:   08/08/23 1543  BP: (!) 168/97  Pulse: (!) 108  Resp: 17  Temp: 97.9 F (36.6 C)  SpO2: 100%   Filed Weights   08/08/23 1543  Weight: 197 lb 3.2 oz (89.4 kg)    GENERAL:alert, no distress and comfortable SKIN: skin color, texture, turgor are normal, no rashes or significant lesions EYES: normal, conjunctiva are pink and non-injected, sclera clear OROPHARYNX:no exudate, no erythema and lips, buccal mucosa, and tongue normal  NECK: supple, thyroid normal size, non-tender, without nodularity LYMPH:  no palpable lymphadenopathy in the cervical, axillary or inguinal LUNGS: clear to auscultation and percussion with normal breathing effort HEART: regular rate & rhythm and no murmurs and no lower extremity edema ABDOMEN:abdomen soft, non-tender and normal bowel sounds Musculoskeletal:no cyanosis of digits and no clubbing  PSYCH: alert & oriented x 3 with fluent speech NEURO: no focal motor/sensory deficits  LABORATORY DATA:  I have reviewed the data as listed    Latest Ref Rng & Units 08/08/2023    3:21 PM 07/02/2023    2:47 PM 05/24/2023    8:43 PM  CBC  WBC 4.0 - 10.5 K/uL 3.9  4.7  4.2   Hemoglobin 12.0 - 15.0 g/dL 42.5  95.6  38.7   Hematocrit 36.0 - 46.0 % 33.9  33.8  31.5   Platelets 150 - 400 K/uL 191  222.0  199        Latest Ref Rng & Units 08/08/2023    3:21 PM 07/17/2023   12:01 PM 07/02/2023    2:47 PM  CMP  Glucose 70 - 99 mg/dL 564  332  951   BUN 8 - 23 mg/dL 21  23  21    Creatinine 0.44 - 1.00 mg/dL 8.84  1.66   0.63   Sodium 135 - 145 mmol/L 142  139  139   Potassium 3.5 - 5.1 mmol/L 4.3  4.0  3.9   Chloride 98 - 111 mmol/L 107  108  106   CO2 22 - 32 mmol/L 28  25  26    Calcium 8.9 - 10.3 mg/dL 9.3  9.4  9.6   Total Protein 6.5 - 8.1 g/dL 7.0  6.8  7.0   Total Bilirubin 0.3 - 1.2 mg/dL 0.6  0.7  0.6   Alkaline Phos 38 - 126 U/L 57  56  61   AST 15 - 41 U/L 15  15  16    ALT 0 - 44 U/L 9  9  10       RADIOGRAPHIC STUDIES: I have personally reviewed the radiological images as listed and agreed with the findings in the report. VAS US CAROTID  Result Date: 07/31/2023 Carotid Arterial Duplex Study Patient Name:  [name]  Date of Exam:   07/31/2023 Medical Rec #: [id number]          Accession #:    [id number] Date of Birth: 1951/09/16         Patient Gender: F Patient Age:  71 years Exam Location:  [facility] Procedure:      VAS US CAROTID Referring Phys: Emilie Rutter --------------------------------------------------------------------------------  Risk Factors:      Hypertension, hyperlipidemia, coronary artery disease. Comparison Study:  07/31/2022                    R=40-59, L=1-39 Performing Technologist: Dorthula Matas RVS, RCS  Examination Guidelines: A complete evaluation includes B-mode imaging, spectral Doppler, color Doppler, and power Doppler as needed of all accessible portions of each vessel. Bilateral testing is considered an integral part of a complete examination. Limited examinations for reoccurring indications may be performed as noted.  Right Carotid Findings: +----------+--------+--------+--------+------------------+----------------+           PSV cm/sEDV cm/sStenosisPlaque DescriptionComments         +----------+--------+--------+--------+------------------+----------------+ CCA Prox  76      16                                                 +----------+--------+--------+--------+------------------+----------------+ CCA Mid   71      18                                                  +----------+--------+--------+--------+------------------+----------------+ CCA Distal60      16              heterogenous                       +----------+--------+--------+--------+------------------+----------------+ ICA Prox  254     67      60-79%  heterogenous      low end of range +----------+--------+--------+--------+------------------+----------------+ ICA Mid   85      16                                                 +----------+--------+--------+--------+------------------+----------------+ ICA Distal86      28                                                 +----------+--------+--------+--------+------------------+----------------+ ECA       150     23                                                 +----------+--------+--------+--------+------------------+----------------+ +----------+--------+-------+----------------+-------------------+           PSV cm/sEDV cmsDescribe        Arm Pressure (mmHG) +----------+--------+-------+----------------+-------------------+ Subclavian117            Multiphasic, [id number]                 +----------+--------+-------+----------------+-------------------+ +---------+--------+--+--------+--+---------+ VertebralPSV cm/s88EDV cm/s23Antegrade +---------+--------+--+--------+--+---------+  Left Carotid Findings: +----------+--------+--------+--------+------------------+--------+           PSV cm/sEDV cm/sStenosisPlaque DescriptionComments +----------+--------+--------+--------+------------------+--------+ CCA Prox  69  16                                         +----------+--------+--------+--------+------------------+--------+ CCA Mid   59      16                                         +----------+--------+--------+--------+------------------+--------+ CCA Distal57      16                                          +----------+--------+--------+--------+------------------+--------+ ICA Prox  40      12      1-39%   heterogenous               +----------+--------+--------+--------+------------------+--------+ ICA Mid   95      31                                         +----------+--------+--------+--------+------------------+--------+ ICA Distal93      30                                         +----------+--------+--------+--------+------------------+--------+ ECA       85      15                                         +----------+--------+--------+--------+------------------+--------+ +----------+--------+--------+----------------+-------------------+           PSV cm/sEDV cm/sDescribe        Arm Pressure (mmHG) +----------+--------+--------+----------------+-------------------+ [id number]             Multiphasic, [id number]                 +----------+--------+--------+----------------+-------------------+ +---------+--------+--+--------+--+---------+ VertebralPSV cm/s42EDV cm/s12Antegrade +---------+--------+--+--------+--+---------+   Summary: Right Carotid: Velocities in 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 Left Carotid: Velocities in the left ICA are consistent with a 1-39% stenosis. Vertebrals:  Bilateral vertebral arteries demonstrate antegrade flow. Subclavians: Normal flow hemodynamics were seen in bilateral subclavian              arteries. *See table(s) above for measurements and observations.  Electronically signed by Lemar Livings MD on 07/31/2023 at 1:35:59 PM.    Final     Orders Placed This Encounter  Procedures   CBC with Differential (Cancer Center Only)    Standing Status:   Future    Number of Occurrences:   1    Standing Expiration Date:   08/07/2024   Iron and Iron Binding Capacity ([facility] only)    Standing Status:   Future    Number of Occurrences:   1    Standing Expiration Date:   08/07/2024   Vitamin B12     Standing Status:   Future    Number of Occurrences:   1    Standing Expiration  Date:   08/07/2024   Ferritin    Standing Status:   Future    Number of Occurrences:   1    Standing Expiration Date:   08/07/2024   CMP (Cancer Center only)    Standing Status:   Future    Number of Occurrences:   1    Standing Expiration Date:   08/07/2024   Retic Panel    Standing Status:   Future    Number of Occurrences:   1    Standing Expiration Date:   08/07/2024    All questions were answered. The patient knows to call the clinic with any problems, questions or concerns. The total time spent in the appointment was 40 minutes.     Carlean Jews, NP 08/11/2023 3:18 PM  Addendum I have seen the patient, examined her. I agree with the assessment and and plan and have edited the notes.   72 year old lady with multiple medical comorbidities, including hypertension, dyslipidemia, fibromyalgia, arthritis, asthma, irritable bowel syndrome, pernicious anemia on B12 injections, CKD, was referred for anemia evaluation.  She has been slightly anemic since April 2024, previous iron, B12 levels were normal.  We discussed, etiology of anemia, likely has anemia of chronic disease from CKD and other medical comorbidities, we will repeat iron level, B12, folate, and reticulocyte count today.  If her anemia gets worse, will obtain further anemia workup, including bone marrow biopsy.  All questions were answered, we will call her with lab results.  I spent a total of 30 minutes for her visit today, more than 50% time on face-to-face counseling.  Malachy Mood MD 08/08/2023

## 2023-08-08 NOTE — Telephone Encounter (Signed)
Left message to return call to office.

## 2023-08-09 LAB — FERRITIN: Ferritin: 97 ng/mL (ref 11–307)

## 2023-08-11 DIAGNOSIS — D509 Iron deficiency anemia, unspecified: Secondary | ICD-10-CM | POA: Insufficient documentation

## 2023-08-11 NOTE — Assessment & Plan Note (Signed)
Likely iron deficiency of chronic disease. Anemia  has been intermittent for past several years, byt consistent over the past 4 months.  Check labs today, including CBC with diff, CMP, reticulocyte panel, ferritin, iron and TIBC, vitamin B12.  Monitor labs every 3 months and follow up In 6 months.

## 2023-08-11 NOTE — Assessment & Plan Note (Signed)
Patient has had complete hysterectomy. Followed by GYN provider.  CT abdomen and pelvis done 03/2023 showed no evidence of malignancy or new disease.

## 2023-08-12 ENCOUNTER — Telehealth: Payer: Self-pay

## 2023-08-12 NOTE — Telephone Encounter (Signed)
Called patient to determine if she is currently still taking Trulance due to receiving fax for refill. Patient confirmed she is NOT taking Trulance

## 2023-08-14 ENCOUNTER — Encounter (HOSPITAL_BASED_OUTPATIENT_CLINIC_OR_DEPARTMENT_OTHER): Payer: Self-pay | Admitting: Cardiovascular Disease

## 2023-08-14 ENCOUNTER — Ambulatory Visit (INDEPENDENT_AMBULATORY_CARE_PROVIDER_SITE_OTHER): Payer: 59 | Admitting: Cardiovascular Disease

## 2023-08-14 VITALS — BP 159/82 | HR 86 | Ht 62.0 in | Wt 196.8 lb

## 2023-08-14 DIAGNOSIS — I2510 Atherosclerotic heart disease of native coronary artery without angina pectoris: Secondary | ICD-10-CM

## 2023-08-14 DIAGNOSIS — I10 Essential (primary) hypertension: Secondary | ICD-10-CM

## 2023-08-14 DIAGNOSIS — R0683 Snoring: Secondary | ICD-10-CM | POA: Diagnosis not present

## 2023-08-14 DIAGNOSIS — I6521 Occlusion and stenosis of right carotid artery: Secondary | ICD-10-CM | POA: Diagnosis not present

## 2023-08-14 DIAGNOSIS — R400 Somnolence: Secondary | ICD-10-CM

## 2023-08-14 MED ORDER — AMLODIPINE BESYLATE 2.5 MG PO TABS: 2.5000 mg | ORAL_TABLET | Freq: Every day | ORAL | 3 refills | Status: DC

## 2023-08-14 NOTE — Progress Notes (Deleted)
Advanced Hypertension Clinic Initial Assessment:    Date:  08/14/2023   ID:  Isabella, [name] [birth year], MRN [id number]  PCP:  Jeani Sow, MD  Cardiologist:  Lance Muss, MD  Nephrologist:  Referring MD: Jeani Sow, MD   CC: Hypertension  History of Present Illness:    [name] is a 72 y.o. female with a hx of non-obstructive CAD, carotid stenosis, diabetes, CKD, hypertension, hyperlipidemia, dementia here to establish care in the Advanced Hypertension Clinic.   She saw Dr. Eldridge Dace in 2021 due to three-vessel CAD on imaging.  She has had lower extremity edema that has been managed with Bumex.  Carotid Dopplers 07/2023 revealed 60 to 79% right ICA stenosis and 1 to 39% left ICA stenosis.  She last saw vascular surgery 07/2023.  She was asymptomatic and medical management was recommended.  She remained off aspirin due to history of GI bleeding.  She last saw Jari Favre, PA-C 12/2022.  BP was 150/60 at that time.  She struggled with multiple medication allergies.  She was not on any antihypertensives at that time.  Previous antihypertensives:  Secondary Causes of Hypertension  Medications/Herbal: OCP, steroids, stimulants, antidepressants, weight loss medication, immune suppressants, NSAIDs, sympathomimetics, alcohol, caffeine, licorice, ginseng, St. John's wort, chemo  Sleep Apnea Renal artery stenosis Hyperaldosteronism Hyper/hypothyroidism Pheochromocytoma: palpitations, tachycardia, headache, diaphoresis (plasma metanephrines) Cushing's syndrome: Cushingoid facies, central obesity, proximal muscle weakness, and ecchymoses, adrenal incidentaloma (cortisol) Coarctation of the aorta  Past Medical History:  Diagnosis Date   Allergy    Anemia    Arthritis    Asthma    Dianosed years ago-no meds at this time   Cancer [facility]) 2021   endometrial    Cataract    Right eye removed-still has left cataract    Chronic kidney disease 1997    Nepthrotic syndrom with minimal change-in remission per pt - last seen by kidney MD- 2 mos ago ? name of MD    Dementia (HCC)    Diabetes mellitus    type 2    Diverticulosis    Dyslipidemia    Dyspnea    with exertion    Dysrhythmia    "skips beats"   Endometrial cancer (HCC) 2021   Family history of adverse reaction to anesthesia    son stopped breathing during surgery , mother slow to wake up    Fibromyalgia 1980's   Generalized anxiety disorder    GERD (gastroesophageal reflux disease)    occ- will use Tums and Prolosec  prn    Heart murmur    Hyperlipemia    Hypertension    not on blood pressure meds due to allergies - last dose of meds- months ago    Hypothyroidism    Hypothyroidism (acquired)    Internal hemorrhoids    Irritable bowel syndrome 1980's   Liver cancer (HCC)    Major depressive disorder    Migraine headaches 1980's   On meds-well controlled   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   Morbid obesity (HCC)    Pernicious anemia    Pneumonia 2009   Several times over the past several years   PONV (postoperative nausea and vomiting)    was told by neurologist due to calcificaton in brain not to be put to sleep, N/V during Op and Recovery    Recurrent upper respiratory infection (URI)    Sleep apnea    no cpap   Subarachnoid hemorrhage (HCC) 2010   Vaginal polyp  benign per pt    Past Surgical History:  Procedure Laterality Date   CATARACT EXTRACTION Right    CHOLECYSTECTOMY  1983   COLONOSCOPY  2008   DB   DILATION AND CURETTAGE OF UTERUS  1972   HYSTEROSCOPY WITH D & C  06/29/2011   Procedure: DILATATION AND CURETTAGE (D&C) /HYSTEROSCOPY;  Surgeon: Reva Bores, MD;  Location: [facility]S;  Service: Gynecology;  Laterality: N/A;   ROBOTIC ASSISTED TOTAL HYSTERECTOMY WITH BILATERAL SALPINGO OOPHERECTOMY N/A 10/27/2020   Procedure: XI ROBOTIC ASSISTED TOTAL HYSTERECTOMY WITH BILATERAL SALPINGO OOPHORECTOMY;  Surgeon: Adolphus Birchwood, MD;  Location: [facility]  ORS;  Service: Gynecology;  Laterality: N/A;   SENTINEL NODE BIOPSY N/A 10/27/2020   Procedure: SENTINEL NODE BIOPSY;  Surgeon: Adolphus Birchwood, MD;  Location: [facility];  Service: Gynecology;  Laterality: N/A;    Current Medications: No outpatient medications have been marked as taking for the 08/14/23 encounter (Appointment) with Chilton Si, MD.   Current Facility-Administered Medications for the 08/14/23 encounter (Appointment) with Chilton Si, MD  Medication   cyanocobalamin (VITAMIN B12) injection 1,000 mcg     Allergies:   Atenolol, Atorvastatin, Canagliflozin, Losartan potassium, Acarbose, Cinnamon, Empagliflozin, Glimepiride, Metformin and related, Other, Pioglitazone, Pravastatin, Sitagliptin, Spironolactone-hctz, Statins, Sulfamethoxazole, Tramadol hcl, Atorvastatin calcium, Augmentin [amoxicillin-pot clavulanate], Crestor [rosuvastatin], Rosuvastatin calcium, Torsemide, Bumex [bumetanide], Lasix [furosemide], and Sulfa antibiotics   Social History   Socioeconomic History   Marital status: Legally Separated    Spouse name: Not on file   Number of children: 3   Years of education: 12   Highest education level: Not on file  Occupational History   Occupation: Retired     Comment: nanny   Occupation: retired  Tobacco Use   Smoking status: Never   Smokeless tobacco: Never  Vaping Use   Vaping status: Never Used  Substance and Sexual Activity   Alcohol use: Not Currently    Comment: quit 1978   Drug use: Never   Sexual activity: Not Currently  Other Topics Concern   Not on file  Social History Narrative    Lives at home. Her granddaughter lives with her and daughter    Right handed   Social Determinants of Health   Financial Resource Strain: Not on file  Food Insecurity: Not on file  Transportation Needs: Not on file  Physical Activity: Not on file  Stress: Not on file  Social Connections: Not on file     Family History: The patient's ***family history  includes Anesthesia problems in her granddaughter, mother, and son; Arthritis in an other family member; Bladder Cancer in her father; Cerebrovascular Disease in her mother; Colon cancer in her father; Diabetes in an other family member; High blood pressure in an other family member; Liver cancer in her father; Schizophrenia in her child and daughter; Tremor in her child and son. There is no history of Dementia, Esophageal cancer, Rectal cancer, Stomach cancer, or Colon polyps.  ROS:   Please see the history of present illness.    *** All other systems reviewed and are negative.  EKGs/Labs/Other Studies Reviewed:    EKG:  EKG is *** ordered today.  The ekg ordered today demonstrates ***  Recent Labs: 12/19/2022: Pro B Natriuretic peptide (BNP) 92.0 04/05/2023: Magnesium 1.7 07/02/2023: TSH 1.32 08/08/2023: ALT 9; BUN 21; Creatinine 1.25; Hemoglobin 11.1; Platelet Count 191; Potassium 4.3; Sodium 142   Recent Lipid Panel    Component Value Date/Time   CHOL 118 07/02/2023 1447   CHOL 134 08/16/2021  1018   TRIG 143.0 07/02/2023 1447   HDL 45.60 07/02/2023 1447   HDL 36 (L) 08/16/2021 1018   CHOLHDL 3 07/02/2023 1447   VLDL 28.6 07/02/2023 1447   LDLCALC 44 07/02/2023 1447   LDLCALC 69 08/16/2021 1018   LDLDIRECT 130 (H) 05/10/2020 1659    Physical Exam:   VS:  There were no vitals taken for this visit. , BMI There is no height or weight on file to calculate BMI. GENERAL:  Well appearing HEENT: Pupils equal round and reactive, fundi not visualized, oral mucosa unremarkable NECK:  No jugular venous distention, waveform within normal limits, carotid upstroke brisk and symmetric, no bruits, no thyromegaly LYMPHATICS:  No cervical adenopathy LUNGS:  Clear to auscultation bilaterally HEART:  RRR.  PMI not displaced or sustained,S1 and S2 within normal limits, no S3, no S4, no clicks, no rubs, *** murmurs ABD:  Flat, positive bowel sounds normal in frequency in pitch, no bruits, no rebound,  no guarding, no midline pulsatile mass, no hepatomegaly, no splenomegaly EXT:  2 plus pulses throughout, no edema, no cyanosis no clubbing SKIN:  No rashes no nodules NEURO:  Cranial nerves II through XII grossly intact, motor grossly intact throughout PSYCH:  Cognitively intact, oriented to person place and time   ASSESSMENT/PLAN:    No problem-specific Assessment & Plan notes found for this encounter.   Screening for Secondary Hypertension: { Click here to document screening for secondary causes of HTN  :[id number]}    Relevant Labs/Studies:    Latest Ref Rng & Units 08/08/2023    3:21 PM 07/17/2023   12:01 PM 07/02/2023    2:47 PM  Basic Labs  Sodium 135 - 145 mmol/L 142  139  139   Potassium 3.5 - 5.1 mmol/L 4.3  4.0  3.9   Creatinine 0.44 - 1.00 mg/dL 4.69  6.29  5.28        Latest Ref Rng & Units 07/02/2023    2:47 PM 12/07/2022   10:38 AM  Thyroid   TSH 0.35 - 5.50 uIU/mL 1.32  1.02      Disposition:    FU with MD/PharmD in {gen number [id number]:244010} {Days to years:10300}    Medication Adjustments/Labs and Tests Ordered: Current medicines are reviewed at length with the patient today.  Concerns regarding medicines are outlined above.  No orders of the defined types were placed in this encounter.  No orders of the defined types were placed in this encounter.    Signed, Chilton Si, MD  08/14/2023 1:33 PM    [facility]

## 2023-08-14 NOTE — Patient Instructions (Addendum)
Medication Instructions:  START AMLODIPINE 2.5 MG DAILY    Labwork: NONE   Testing/Procedures: CALL IF YOU DECIDE TO DO A SLEEP STUDY  [phone]    Follow-Up: 1 TO 2 MONTHS WITH CAITLIN W NP OR PHARM D IN ADV HTN CLINIC    You will receive a phone call from the [facility] exercise and nutrition program to schedule an initial assessment.   Special Instructions:  MONITOR YOUR BLOOD PRESSURE AND BRING YOUR READINGS TO YOUR FOLLOW UP APPOINTMENT   DASH Eating Plan DASH stands for "Dietary Approaches to Stop Hypertension." The DASH eating plan is a healthy eating plan that has been shown to reduce high blood pressure (hypertension). It may also reduce your risk for type 2 diabetes, heart disease, and stroke. The DASH eating plan may also help with weight loss. What are tips for following this plan?  General guidelines Avoid eating more than 2,300 mg (milligrams) of salt (sodium) a day. If you have hypertension, you may need to reduce your sodium intake to 1,500 mg a day. Limit alcohol intake to no more than 1 drink a day for nonpregnant women and 2 drinks a day for men. One drink equals 12 oz of beer, 5 oz of wine, or 1 oz of hard liquor. Work with your health care provider to maintain a healthy body weight or to lose weight. Ask what an ideal weight is for you. Get at least 30 minutes of exercise that causes your heart to beat faster (aerobic exercise) most days of the week. Activities may include walking, swimming, or biking. Work with your health care provider or diet and nutrition specialist (dietitian) to adjust your eating plan to your individual calorie needs. Reading food labels  Check food labels for the amount of sodium per serving. Choose foods with less than 5 percent of the Daily Value of sodium. Generally, foods with less than 300 mg of sodium per serving fit into this eating plan. To find whole grains, look for the word "whole" as the first word in the ingredient  list. Shopping Buy products labeled as "low-sodium" or "no salt added." Buy fresh foods. Avoid canned foods and premade or frozen meals. Cooking Avoid adding salt when cooking. Use salt-free seasonings or herbs instead of table salt or sea salt. Check with your health care provider or pharmacist before using salt substitutes. Do not fry foods. Cook foods using healthy methods such as baking, boiling, grilling, and broiling instead. Cook with heart-healthy oils, such as olive, canola, soybean, or sunflower oil. Meal planning Eat a balanced diet that includes: 5 or more servings of fruits and vegetables each day. At each meal, try to fill half of your plate with fruits and vegetables. Up to 6-8 servings of whole grains each day. Less than 6 oz of lean meat, poultry, or fish each day. A 3-oz serving of meat is about the same size as a deck of cards. One egg equals 1 oz. 2 servings of low-fat dairy each day. A serving of nuts, seeds, or beans 5 times each week. Heart-healthy fats. Healthy fats called Omega-3 fatty acids are found in foods such as flaxseeds and coldwater fish, like sardines, salmon, and mackerel. Limit how much you eat of the following: Canned or prepackaged foods. Food that is high in trans fat, such as fried foods. Food that is high in saturated fat, such as fatty meat. Sweets, desserts, sugary drinks, and other foods with added sugar. Full-fat dairy products. Do not salt foods before eating.  Try to eat at least 2 vegetarian meals each week. Eat more home-cooked food and less restaurant, buffet, and fast food. When eating at a restaurant, ask that your food be prepared with less salt or no salt, if possible. What foods are recommended? The items listed may not be a complete list. Talk with your dietitian about what dietary choices are best for you. Grains Whole-grain or whole-wheat bread. Whole-grain or whole-wheat pasta. Brown rice. Orpah Cobb. Bulgur. Whole-grain and  low-sodium cereals. Pita bread. Low-fat, low-sodium crackers. Whole-wheat flour tortillas. Vegetables Fresh or frozen vegetables (raw, steamed, roasted, or grilled). Low-sodium or reduced-sodium tomato and vegetable juice. Low-sodium or reduced-sodium tomato sauce and tomato paste. Low-sodium or reduced-sodium canned vegetables. Fruits All fresh, dried, or frozen fruit. Canned fruit in natural juice (without added sugar). Meat and other protein foods Skinless chicken or [place]. Ground chicken or [place]. Pork with fat trimmed off. Fish and seafood. Egg whites. Dried beans, peas, or lentils. Unsalted nuts, nut butters, and seeds. Unsalted canned beans. Lean cuts of beef with fat trimmed off. Low-sodium, lean deli meat. Dairy Low-fat (1%) or fat-free (skim) milk. Fat-free, low-fat, or reduced-fat cheeses. Nonfat, low-sodium ricotta or cottage cheese. Low-fat or nonfat yogurt. Low-fat, low-sodium cheese. Fats and oils Soft margarine without trans fats. Vegetable oil. Low-fat, reduced-fat, or light mayonnaise and salad dressings (reduced-sodium). Canola, safflower, olive, soybean, and sunflower oils. Avocado. Seasoning and other foods Herbs. Spices. Seasoning mixes without salt. Unsalted popcorn and pretzels. Fat-free sweets. What foods are not recommended? The items listed may not be a complete list. Talk with your dietitian about what dietary choices are best for you. Grains Baked goods made with fat, such as croissants, muffins, or some breads. Dry pasta or rice meal packs. Vegetables Creamed or fried vegetables. Vegetables in a cheese sauce. Regular canned vegetables (not low-sodium or reduced-sodium). Regular canned tomato sauce and paste (not low-sodium or reduced-sodium). Regular tomato and vegetable juice (not low-sodium or reduced-sodium). Rosita Fire. Olives. Fruits Canned fruit in a light or heavy syrup. Fried fruit. Fruit in cream or butter sauce. Meat and other protein foods Fatty cuts of  meat. Ribs. Fried meat. Tomasa Blase. Sausage. Bologna and other processed lunch meats. Salami. Fatback. Hotdogs. Bratwurst. Salted nuts and seeds. Canned beans with added salt. Canned or smoked fish. Whole eggs or egg yolks. Chicken or [place] with skin. Dairy Whole or 2% milk, cream, and half-and-half. Whole or full-fat cream cheese. Whole-fat or sweetened yogurt. Full-fat cheese. Nondairy creamers. Whipped toppings. Processed cheese and cheese spreads. Fats and oils Butter. Stick margarine. Lard. Shortening. Ghee. Bacon fat. Tropical oils, such as coconut, palm kernel, or palm oil. Seasoning and other foods Salted popcorn and pretzels. Onion salt, garlic salt, seasoned salt, table salt, and sea salt. Worcestershire sauce. Tartar sauce. Barbecue sauce. Teriyaki sauce. Soy sauce, including reduced-sodium. Steak sauce. Canned and packaged gravies. Fish sauce. Oyster sauce. Cocktail sauce. Horseradish that you find on the shelf. Ketchup. Mustard. Meat flavorings and tenderizers. Bouillon cubes. Hot sauce and Tabasco sauce. Premade or packaged marinades. Premade or packaged taco seasonings. Relishes. Regular salad dressings. Where to find more information: National Heart, Lung, and Blood Institute: [employer].is American Heart Association: www.heart.org Summary The DASH eating plan is a healthy eating plan that has been shown to reduce high blood pressure (hypertension). It may also reduce your risk for type 2 diabetes, heart disease, and stroke. With the DASH eating plan, you should limit salt (sodium) intake to 2,300 mg a day. If you have hypertension, you may need  to reduce your sodium intake to 1,500 mg a day. When on the DASH eating plan, aim to eat more fresh fruits and vegetables, whole grains, lean proteins, low-fat dairy, and heart-healthy fats. Work with your health care provider or diet and nutrition specialist (dietitian) to adjust your eating plan to your individual calorie needs. This  information is not intended to replace advice given to you by your health care provider. Make sure you discuss any questions you have with your health care provider. Document Released: 11/22/2011 Document Revised: 11/15/2017 Document Reviewed: 11/26/2016 Elsevier Patient Education  2020 [employer].

## 2023-08-14 NOTE — Progress Notes (Signed)
Advanced Hypertension Clinic Initial Assessment:    Date:  08/19/2023   ID:  [name], DOB 1951-12-01, MRN [id number]  PCP:  Jeani Sow, MD  Cardiologist:  Lance Muss, MD  Nephrologist:  Referring MD: Jeani Sow, MD   CC: Hypertension  History of Present Illness:    [name] is a 72 y.o. female with a hx of non-obstructive CAD, carotid stenosis, diabetes, CKD, hypertension, hyperlipidemia, dementia, here to establish care in the Advanced [facility]pertension Clinic.   She saw Dr. Eldridge Dace in 2021 due to three-vessel CAD on imaging.  She has had lower extremity edema that has been managed with Bumex.  Carotid Dopplers 07/2023 revealed 60 to 79% right ICA stenosis and 1 to 39% left ICA stenosis.  She last saw vascular surgery 07/2023.  She was asymptomatic and medical management was recommended.  She remained off aspirin due to history of GI bleeding.  She last saw Jari Favre, PA-C 12/2022.  BP was 150/60 at that time.  She struggled with multiple medication allergies.  She was not on any antihypertensives at that time.  Today, she confirms struggling with high blood pressures. Per her home blood pressure log, she has seen readings as high as 220 systolic. Her diastolic pressure is usually in the 70s-80s with a few 90s. On my review, her systolic readings are mostly in the 120s-160s. Her blood pressure seems to be higher in the mornings. In the office her blood pressure is elevated to 159/82 initially, and - on manual recheck. She endorses a component of white coat hypertension. At this time she is not taking eplerenone; she had tried this but subsequently developed higher blood pressures. She states that she is a fall risk, which has limited her formal exercise. Mostly she stays active with cleaning and walking around the house. Additionally she is not sleeping well, often gets up at 2-3 AM and wakes up multiple times through the night. Since April she has been  feeling more fatigued. She does snore, has not been previously tested for sleep apnea. For 6 months she has also experienced shortness of breath. At night she suffers from a cough with wheezing. She states that there has been mold found in her air conditioners, which she feels is contributory to her health issues. Her diet often consists of frozen dinners, may have 1-3 cups of coffee in the mornings and otherwise drinks water throughout the day. Occasionally has up to 2 diet sodas. No alcohol consumption. No smoking history. Currently on Repatha every 2 weeks. She has gained some weight in the past 6 months. She reports a peak weight of 289 lbs, then lost down to 175 lbs. In clinic today she is 196 lbs. A few months ago hospitalized with severe reaction to augmentin. In the past few months she notes that her uric acid was elevated twice. Lately she has been under significant stress and is feeling very depressed. She affirms that she has seen a neurologist regarding worsening memory issues. Additionally she complains of chronic, freezing chills that has been an ongoing issue for years. She denies any palpitations, chest pain, peripheral edema, lightheadedness, headaches, syncope, orthopnea, or PND.  Previous antihypertensives: Atenolol - GI issues, chills Losartan Spironolactone/HCTZ Eplerenone - increased BP  Past Medical History:  Diagnosis Date   Allergy    Anemia    Arthritis    Asthma    Dianosed years ago-no meds at this time   Cancer [facility]) 2021   endometrial  Cataract    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 name of MD    Dementia (HCC)    Diabetes mellitus    type 2    Diverticulosis    Dyslipidemia    Dyspnea    with exertion    Dysrhythmia    "skips beats"   Endometrial cancer (HCC) 2021   Family history of adverse reaction to anesthesia     son stopped breathing during surgery , mother slow to wake up    Fibromyalgia 1980's   Generalized anxiety disorder    GERD (gastroesophageal reflux disease)    occ- will use Tums and Prolosec  prn    Heart murmur    Hyperlipemia    Hypertension    not on blood pressure meds due to allergies - last dose of meds- months ago    Hypothyroidism    Hypothyroidism (acquired)    Internal hemorrhoids    Irritable bowel syndrome 1980's   Liver cancer (HCC)    Major depressive disorder    Migraine headaches 1980's   On meds-well controlled   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   Morbid obesity (HCC)    Pernicious anemia    Pneumonia 2009   Several times over the past several years   PONV (postoperative nausea and vomiting)    was told by neurologist due to calcificaton in brain not to be put to sleep, N/V during Op and Recovery    Recurrent upper respiratory infection (URI)    Sleep apnea    no cpap   Subarachnoid hemorrhage (HCC) 2010   Vaginal polyp    benign per pt    Past Surgical History:  Procedure Laterality Date   CATARACT EXTRACTION Right    CHOLECYSTECTOMY  1983   COLONOSCOPY  2008   DB   DILATION AND CURETTAGE OF UTERUS  1972   HYSTEROSCOPY WITH D & C  06/29/2011   Procedure: DILATATION AND CURETTAGE (D&C) /HYSTEROSCOPY;  Surgeon: Reva Bores, MD;  Location: [facility];  Service: Gynecology;  Laterality: N/A;   ROBOTIC ASSISTED TOTAL HYSTERECTOMY WITH BILATERAL SALPINGO OOPHERECTOMY N/A 10/27/2020   Procedure: XI ROBOTIC ASSISTED TOTAL HYSTERECTOMY WITH BILATERAL SALPINGO OOPHORECTOMY;  Surgeon: Adolphus Birchwood, MD;  Location: [facility];  Service: Gynecology;  Laterality: N/A;   SENTINEL NODE BIOPSY N/A 10/27/2020   Procedure: SENTINEL NODE BIOPSY;  Surgeon: Adolphus Birchwood, MD;  Location: [facility];  Service: Gynecology;  Laterality: N/A;    Current Medications: Current Meds  Medication Sig   amLODipine (NORVASC) 2.5 MG tablet Take 1 tablet (2.5 mg total) by mouth daily.    Cholecalciferol (VITAMIN D3) 25 MCG (1000 UT) capsule Take 1,000 Units by mouth daily.   cyanocobalamin (,VITAMIN B-12,) 1000 MCG/ML injection Inject 1,000 mcg into the muscle every 30 (thirty) days.    diclofenac Sodium (VOLTAREN) 1 % GEL Apply 2 g topically 4 (four) times daily.   dicyclomine (BENTYL) 20 MG tablet Take 1 tablet (20 mg total) by mouth every 8 (eight) hours as needed for spasms (AB pain).   febuxostat (ULORIC) 40 MG tablet Take 1 tablet (40 mg total) by mouth daily.   glucose blood (ACCU-CHEK GUIDE) test strip Use to test blood sugar 3 times daily   levothyroxine (SYNTHROID) 50 MCG tablet Take 1 tablet (50 mcg total) by mouth daily before breakfast.  MAGNESIUM CITRATE PO Take 1 tablet by mouth 2 (two) times daily.   mirtazapine (REMERON) 15 MG tablet Take 1 tablet (15 mg total) by mouth at bedtime.   Multiple Vitamin (MULTIVITAMIN WITH MINERALS) TABS tablet Take 1 tablet by mouth daily.   Na Sulfate-K Sulfate-Mg Sulf 17.5-3.13-1.6 GM/177ML SOLN    pantoprazole (PROTONIX) 40 MG tablet Take 40 mg by mouth daily.   pregabalin (LYRICA) 50 MG capsule Take 50 mg by mouth as needed ((for pain).).   REPATHA SURECLICK 140 MG/ML SOAJ INJECT 1 PEN SUBCUTANEOUSLY EVERY 14 DAYS   SYSTANE HYDRATION PF 0.4-0.3 % SOLN Place 1-2 drops into both eyes in the morning.   [DISCONTINUED] oxyCODONE (OXY IR/ROXICODONE) 5 MG immediate release tablet Take 1 tablet (5 mg total) by mouth every 6 (six) hours as needed for moderate pain.   [DISCONTINUED] pregabalin (LYRICA) 50 MG capsule Take 1 capsule (50 mg total) by mouth daily as needed (for pain).   [DISCONTINUED] traZODone (DESYREL) 50 MG tablet Take 1 tablet (50 mg total) by mouth at bedtime.   Current Facility-Administered Medications for the 08/14/23 encounter (Office Visit) with Chilton Si, MD  Medication   cyanocobalamin (VITAMIN B12) injection 1,000 mcg     Allergies:   Atenolol, Atorvastatin, Canagliflozin, Losartan potassium, Acarbose,  Cinnamon, Empagliflozin, Glimepiride, Metformin and related, Other, Pioglitazone, Pravastatin, Sitagliptin, Spironolactone-hctz, Statins, Sulfamethoxazole, Tramadol hcl, Atorvastatin calcium, Augmentin [amoxicillin-pot clavulanate], Crestor [rosuvastatin], Rosuvastatin calcium, Torsemide, Bumex [bumetanide], Lasix [furosemide], and Sulfa antibiotics   Social History   Socioeconomic History   Marital status: Legally Separated    Spouse name: Not on file   Number of children: 3   Years of education: 12   Highest education level: Not on file  Occupational History   Occupation: Retired     Comment: nanny   Occupation: retired  Tobacco Use   Smoking status: Never   Smokeless tobacco: Never  Vaping Use   Vaping status: Never Used  Substance and Sexual Activity   Alcohol use: Not Currently    Comment: quit 1978   Drug use: Never   Sexual activity: Not Currently  Other Topics Concern   Not on file  Social History Narrative    Lives at home. Her granddaughter lives with her and daughter    Right handed   Social Determinants of Health   Financial Resource [name]: Not on file  Food Insecurity: Not on file  Transportation Needs: Not on file  Physical Activity: Not on file  Stress: Not on file  Social Connections: Not on file     Family History: The patient's family history includes Anesthesia problems in her granddaughter, mother, and son; Arthritis in an other family member; Bladder Cancer in her father; Cerebrovascular Disease in her mother; Colon cancer in her father; Diabetes in an other family member; High blood pressure in an other family member; Liver cancer in her father; Schizophrenia in her child and daughter; Tremor in her child and son. There is no history of Dementia, Esophageal cancer, Rectal cancer, Stomach cancer, or Colon polyps.  ROS:   Please see the history of present illness.    (+) Fatigue (+) Shortness of breath (+) Nocturnal cough and wheezing (+)  Insomnia (+) Stress (+) Depressed mood (+) Snoring (+) Chronic, freezing chills All other systems reviewed and are negative.  EKGs/Labs/Other Studies Reviewed:    Bilateral Carotid Dopplers  07/31/2023: Summary:  Right Carotid: Velocities in the right ICA are consistent with a 60-79% stenosis.   Left Carotid: Velocities in  the left ICA are consistent with a 1-39%  stenosis.   Vertebrals: Bilateral vertebral arteries demonstrate antegrade flow.  Subclavians: Normal flow hemodynamics were seen in bilateral subclavian arteries.   Echo w/ Bubble Study  03/12/2023:  1. Left ventricular ejection fraction, by estimation, is 60 to 65%. The  left ventricle has normal function. Left ventricular diastolic parameters  were normal.   2. Right ventricular systolic function is normal.   3. The mitral valve is normal in structure.   4. The aortic valve was not assessed.   5. Positive color doppler with left to right flow.. Agitated saline  contrast bubble study was negative, with no evidence of any interatrial  shunt.   EKG:  EKG is personally reviewed. 08/14/2023:  Not ordered.  Recent Labs: 12/19/2022: Pro B Natriuretic peptide (BNP) 92.0 04/05/2023: Magnesium 1.7 07/02/2023: TSH 1.32 08/08/2023: ALT 9; BUN 21; Creatinine 1.25; Hemoglobin 11.1; Platelet Count 191; Potassium 4.3; Sodium 142   Recent Lipid Panel    Component Value Date/Time   CHOL 118 07/02/2023 1447   CHOL 134 08/16/2021 1018   TRIG 143.0 07/02/2023 1447   HDL 45.60 07/02/2023 1447   HDL 36 (L) 08/16/2021 1018   CHOLHDL 3 07/02/2023 1447   VLDL 28.6 07/02/2023 1447   LDLCALC 44 07/02/2023 1447   LDLCALC 69 08/16/2021 1018   LDLDIRECT 130 (H) 05/10/2020 1659    Physical Exam:    VS:  BP (!) 159/82 (BP Location: Right Arm, Patient Position: Sitting, Cuff Size: Normal)   Pulse 86   Ht 5\' 2"  (1.575 m)   Wt 196 lb 12.8 oz (89.3 kg)   SpO2 98%   BMI 36.00 kg/m  , BMI Body mass index is 36 kg/m. GENERAL:  Well  appearing HEENT: Pupils equal round and reactive, fundi not visualized, oral mucosa unremarkable NECK:  No jugular venous distention, waveform within normal limits, carotid upstroke brisk and symmetric, no bruits, no thyromegaly LUNGS:  Clear to auscultation bilaterally HEART:  RRR.  PMI not displaced or sustained,S1 and S2 within normal limits, no S3, no S4, no clicks, no rubs, no murmurs ABD:  Flat, positive bowel sounds normal in frequency in pitch, no bruits, no rebound, no guarding, no midline pulsatile mass, no hepatomegaly, no splenomegaly EXT:  2 plus pulses throughout, no edema, no cyanosis no clubbing SKIN:  No rashes no nodules NEURO:  Cranial nerves II through XII grossly intact, motor grossly intact throughout PSYCH:  Cognitively intact, oriented to person place and time   ASSESSMENT/PLAN:    # Hypertension Uncontrolled with home readings as high as 220 systolic. Patient has multiple medication allergies and has been off antihypertensive medications for approximately six months. -Start Amlodipine 2.5mg  daily.  -Check blood pressure at home and record readings. -Follow-up in 1 month to assess response to medication.   # CAD:  # Hyperlipidemia: Mild CAD on coronary CT-A 04/2020.  Continue Repatha.  She has been intolerant to statins.  # Possible Sleep Apnea Patient reports poor sleep quality and frequent awakenings. No prior sleep study has been conducted. -Order home sleep study to evaluate for sleep apnea.  # Weight Gain Patient reports significant weight gain over the past six months. -Refer to [facility] for comprehensive weight management support. -Consider participation in the [facility] PREP program for diet and exercise guidance.  # Depression Patient reports severe depression and social isolation. Unable to take antidepressants due to medication allergies. -Continue to work with primary care provider to explore  possible treatment  options.  # Breathing Issues Patient reports worsening breathing issues over the past six months, possibly related to mold exposure. -Continue to monitor symptoms and consider referral to pulmonology if symptoms persist or worsen.  # Fluid Retention Patient reports leg swelling and rapid weight gain. Systolic and diastolic function were normal on echo 02/2023.  -Consider low dose diuretic therapy, pending discussion with primary care provider. - Compression socks advised.  Stop amlodipine if it worsens.     Screening for Secondary Hypertension:     08/14/2023    3:01 PM  Causes  Drugs/Herbals Screened     - Comments +frozen meals.  +1-3 cups of coffee.  2 sodas/day.  No EtOH/tob  Renovascular HTN N/A  Thyroid Disease Screened  Hyperaldosteronism N/A  Pheochromocytoma N/A  Cushing's Syndrome N/A  Hyperparathyroidism N/A  Coarctation of the Aorta Screened     - Comments BP symmetric  Compliance Screened    Relevant Labs/Studies:    Latest Ref Rng & Units 08/08/2023    3:21 PM 07/17/2023   12:01 PM 07/02/2023    2:47 PM  Basic Labs  Sodium 135 - 145 mmol/L 142  139  139   Potassium 3.5 - 5.1 mmol/L 4.3  4.0  3.9   Creatinine 0.44 - 1.00 mg/dL 1.61  0.96  0.45        Latest Ref Rng & Units 07/02/2023    2:47 PM 12/07/2022   10:38 AM  Thyroid   TSH 0.35 - 5.50 uIU/mL 1.32  1.02      Disposition:    FU with APP/PharmD in 1 month for the next 3 months. FU with Brice Kossman C. Duke Salvia, MD, [facility] in 4 months.  Medication Adjustments/Labs and Tests Ordered: Current medicines are reviewed at length with the patient today.  Concerns regarding medicines are outlined above.   Orders Placed This Encounter  Procedures   Amb Referral To Provider Referral Exercise Program (P.R.E.P)   Meds ordered this encounter  Medications   amLODipine (NORVASC) 2.5 MG tablet    Sig: Take 1 tablet (2.5 mg total) by mouth daily.    Dispense:  90 tablet    Refill:  3   I,Mathew Stumpf,acting as a  scribe for Chilton Si, MD.,have documented all relevant documentation on the behalf of Chilton Si, MD,as directed by  Chilton Si, MD while in the presence of Chilton Si, MD.  I, Jetta Murray C. Duke Salvia, MD have reviewed all documentation for this visit.  The documentation of the exam, diagnosis, procedures, and orders on 08/19/2023 are all accurate and complete.   Signed, Chilton Si, MD  08/19/2023 2:40 PM    [facility]

## 2023-08-15 ENCOUNTER — Ambulatory Visit: Payer: 59 | Admitting: Obstetrics and Gynecology

## 2023-08-16 ENCOUNTER — Telehealth: Payer: Self-pay

## 2023-08-16 NOTE — Telephone Encounter (Signed)
Call to pt ref referral to PREP.  In the process of getting in the car. Offered to call her back next week. Pt agreeable. Will call her next week.

## 2023-08-19 ENCOUNTER — Encounter (HOSPITAL_BASED_OUTPATIENT_CLINIC_OR_DEPARTMENT_OTHER): Payer: Self-pay | Admitting: Cardiovascular Disease

## 2023-08-20 ENCOUNTER — Encounter: Payer: 59 | Admitting: Physical Medicine and Rehabilitation

## 2023-08-20 ENCOUNTER — Telehealth: Payer: Self-pay

## 2023-08-20 ENCOUNTER — Ambulatory Visit: Payer: 59

## 2023-08-20 NOTE — Telephone Encounter (Signed)
VMT pt requesting call back to discuss PREP

## 2023-08-27 ENCOUNTER — Telehealth: Payer: Self-pay | Admitting: Interventional Cardiology

## 2023-08-27 NOTE — Telephone Encounter (Signed)
Patient states she is a patient of Dr. Hoyle Barr and she usually has yearly labs drawn for him. She states she is on repatha and has not had her repatha labs drawn.  Informed patient she had a Lipid panel in July 2024 and CMP in August 2024, LDL and LFTs WNL. Patient states she was not aware she had already had these labs drawn.  Patient states she is also scheduled to have a colonoscopy and endoscopy in November, and she would like to reschedule her appt with Dr. Elease Hashimoto (following after Dr. Eldridge Dace leaves our practice) for a sooner date in October to be evaluated prior to her procedures.  Patient rescheduled for 10/14/2023 to see Dr. Elease Hashimoto. Patient verbalized understanding and expressed appreciation for assistance.

## 2023-08-27 NOTE — Telephone Encounter (Signed)
Patient is requesting orders for lab work. 

## 2023-08-28 ENCOUNTER — Ambulatory Visit (INDEPENDENT_AMBULATORY_CARE_PROVIDER_SITE_OTHER): Payer: 59 | Admitting: Podiatry

## 2023-08-28 ENCOUNTER — Encounter: Payer: Self-pay | Admitting: Podiatry

## 2023-08-28 DIAGNOSIS — E1122 Type 2 diabetes mellitus with diabetic chronic kidney disease: Secondary | ICD-10-CM | POA: Diagnosis not present

## 2023-08-28 DIAGNOSIS — N1831 Chronic kidney disease, stage 3a: Secondary | ICD-10-CM | POA: Diagnosis not present

## 2023-08-28 DIAGNOSIS — B351 Tinea unguium: Secondary | ICD-10-CM | POA: Diagnosis not present

## 2023-08-28 DIAGNOSIS — M79675 Pain in left toe(s): Secondary | ICD-10-CM | POA: Diagnosis not present

## 2023-08-28 DIAGNOSIS — M79674 Pain in right toe(s): Secondary | ICD-10-CM | POA: Diagnosis not present

## 2023-08-28 NOTE — Progress Notes (Signed)
Subjective:  Patient ID: [name], female    DOB: [birth year],  MRN: [id number]  CATHERYN YOUNIS presents to clinic today for at risk foot care. Pt has h/o NIDDM with chronic kidney disease and painful elongated mycotic toenails 1-5 bilaterally which are tender when wearing enclosed shoe gear. Pain is relieved with periodic professional debridement.  Chief Complaint  Patient presents with   Diabetes    DFC BS - DIDN'T CHECK IT  A1C - 7    New problem(s): None.   PCP is Jeani Sow, MD.  Allergies  Allergen Reactions   Atenolol Other (See Comments)    Stomach problems, chills   Atorvastatin Other (See Comments)    Retained fluid   Canagliflozin Other (See Comments)    ALL DIABETIC MEDS PER PT   abdominal pain, headache   Losartan Potassium Other (See Comments)    chills, HA, stomach pain   Acarbose Nausea Only and Other (See Comments)     headache,nausea   Cinnamon Other (See Comments)    Stomach pain   Empagliflozin Other (See Comments)     headaches   Glimepiride Other (See Comments)    ALL DIABETIC MEDS PER PT  abdominal pain, headache   Metformin And Related Nausea And Vomiting and Other (See Comments)    Patient states that she has severe chills, headache and cramping additionally. Says blood sugar is uncontrolled because of this   Other Other (See Comments)    ALL DIABETIC MEDS PER PT    Pioglitazone Other (See Comments)    ALL DIABETIC MEDS PER PT = peripheral edema   Pravastatin Other (See Comments)    Retained fluid   Sitagliptin Other (See Comments)    ALL DIABETIC MEDS PER PT  Other reaction(s): headache   Spironolactone-Hctz Other (See Comments)   Statins Other (See Comments)    Myalgia   Sulfamethoxazole Other (See Comments)    ALL DIABETIC MEDS PER PT    Tramadol Hcl Itching   Atorvastatin Calcium Other (See Comments)     retain fld   Augmentin [Amoxicillin-Pot Clavulanate] Other (See Comments)    Caused jaundice   Crestor  [Rosuvastatin] Other (See Comments)   Rosuvastatin Calcium    Torsemide Nausea Only    Gi upset, chills, abd pain,bloating   Bumex [Bumetanide] Rash and Other (See Comments)    Chills, rash, headaches, stomach issues also   Lasix [Furosemide] Rash and Other (See Comments)    Sugars increased   Sulfa Antibiotics Rash and Other (See Comments)    Mother has told patient in the past not to take, she cannot recall there reaction    Review of Systems: Negative except as noted in the HPI.  Objective: No changes noted in today's physical examination. There were no vitals filed for this visit. Umi BRITTNEE GNAU is a pleasant 72 y.o. female WD, WN in NAD. AAO x 3.  Vascular Examination: Capillary refill time immediate b/l. Vascular status intact b/l with palpable pedal pulses. Pedal hair present b/l. No pain with calf compression b/l. Skin temperature gradient WNL b/l. No cyanosis or clubbing b/l. No ischemia or gangrene noted b/l.   Neurological Examination: Sensation grossly intact b/l with 10 gram monofilament. Vibratory sensation intact b/l.   Dermatological Examination: Pedal skin with normal turgor, texture and tone b/l.  No open wounds. No interdigital macerations.   Toenails 1-5 b/l thick, discolored, elongated with subungual debris and pain on dorsal palpation.   No hyperkeratotic nor porokeratotic lesions  present on today's visit.  Musculoskeletal Examination: Muscle strength 5/5 to all lower extremity muscle groups bilaterally. HAV with bunion deformity noted b/l LE.  Radiographs: None  Assessment/Plan: 1. Pain due to onychomycosis of toenails of both feet   2. Type 2 diabetes mellitus with stage 3a chronic kidney disease, without long-term current use of insulin (HCC)     -Consent given for treatment as described below: -Examined patient. -Continue foot and shoe inspections daily. Monitor blood glucose per PCP/Endocrinologist's recommendations. -Continue supportive shoe  gear daily. -Mycotic toenails 1-5 bilaterally were debrided in length and girth with sterile nail nippers and dremel without incident. -Patient/POA to call should there be question/concern in the interim.   Return in about 9 weeks (around 10/30/2023).  Freddie Breech, DPM

## 2023-08-29 ENCOUNTER — Ambulatory Visit: Payer: 59 | Admitting: Family Medicine

## 2023-09-02 ENCOUNTER — Other Ambulatory Visit: Payer: Self-pay | Admitting: Family Medicine

## 2023-09-02 ENCOUNTER — Ambulatory Visit
Admission: RE | Admit: 2023-09-02 | Discharge: 2023-09-02 | Disposition: A | Discharge status: Home / Self Care | Payer: 59 | Source: Ambulatory Visit | Attending: Family Medicine | Admitting: Family Medicine

## 2023-09-02 DIAGNOSIS — I10 Essential (primary) hypertension: Secondary | ICD-10-CM

## 2023-09-02 DIAGNOSIS — Z1231 Encounter for screening mammogram for malignant neoplasm of breast: Secondary | ICD-10-CM

## 2023-09-02 DIAGNOSIS — R600 Localized edema: Secondary | ICD-10-CM

## 2023-09-02 DIAGNOSIS — M10071 Idiopathic gout, right ankle and foot: Secondary | ICD-10-CM

## 2023-09-03 ENCOUNTER — Other Ambulatory Visit: Payer: Self-pay | Admitting: *Deleted

## 2023-09-09 ENCOUNTER — Telehealth: Payer: Self-pay

## 2023-09-09 NOTE — Telephone Encounter (Signed)
Call to pt to check interest in PREP.  Explained program. Concerned about multiple medical programs but most concerned about recurrent diarrhea. Needs to have a colon/endo but not scheduled. Agreed to being called in Nov to check in to see if she is ready to start.  Has my number for call back.

## 2023-09-10 ENCOUNTER — Telehealth: Payer: Self-pay | Admitting: Physician Assistant

## 2023-09-10 ENCOUNTER — Telehealth: Payer: Self-pay | Admitting: Family Medicine

## 2023-09-10 NOTE — Telephone Encounter (Signed)
Patient requests to be called to be given Mammogram results

## 2023-09-10 NOTE — Telephone Encounter (Signed)
Spoke to patient and gave her results per imaging:  FINDINGS: There are no findings suspicious for malignancy.   IMPRESSION: No mammographic evidence of malignancy. A result letter of this screening mammogram will be mailed directly to the patient.   RECOMMENDATION: Screening mammogram in one year. (Code:SM-B-01Y)   BI-RADS CATEGORY  1: Negative.  Patient verbalized understanding.

## 2023-09-10 NOTE — Telephone Encounter (Signed)
Pt states her diarrhea is worse and at times it comes out and she doesn't know it. Upset as she cannot see Dr. Lavon Paganini until Nov. Offered pt an appt tomorrow with PA and she states she cannot make that appt. Scheduled pt to see Doug Sou PA 09/17/33 at 1:30pm. Pt aware of appt.

## 2023-09-10 NOTE — Telephone Encounter (Signed)
Left message to return call to office.

## 2023-09-10 NOTE — Telephone Encounter (Signed)
Inbound call from patient stating that she is having more diarrhea in the last month to month in a half. Patient has an appointment on 11/15 and is requesting a call to discuss. Please advise.

## 2023-09-17 ENCOUNTER — Encounter: Payer: Self-pay | Admitting: Obstetrics and Gynecology

## 2023-09-17 ENCOUNTER — Ambulatory Visit (INDEPENDENT_AMBULATORY_CARE_PROVIDER_SITE_OTHER): Payer: 59 | Admitting: Obstetrics and Gynecology

## 2023-09-17 VITALS — BP 143/71 | HR 101

## 2023-09-17 DIAGNOSIS — N8110 Cystocele, unspecified: Secondary | ICD-10-CM | POA: Diagnosis not present

## 2023-09-17 DIAGNOSIS — N952 Postmenopausal atrophic vaginitis: Secondary | ICD-10-CM | POA: Diagnosis not present

## 2023-09-17 MED ORDER — ESTRADIOL 7.5 MCG/24HR VA RING: 1.0000 | VAGINAL_RING | VAGINAL | 3 refills | Status: DC

## 2023-09-17 NOTE — Progress Notes (Signed)
[facility] Return Visit  SUBJECTIVE  History of Present Illness: [name] is a 72 y.o. female seen in follow-up for prolapse  Having increased pressure in the vaginal area. She is having more difficulty with urination.   She is having more rectal pressure and diarrhea along with incontinence. She has an appt with GI tomorrow for this. She was previously recommended to have a colonoscopy and upper GI but wanted to meet with the doctor before she has the testing.   She does not feel she can have surgery due to her medical issues but is interested in a pessary. We discussed this will likely help her to void better.   Past Medical History: Patient  has a past medical history of Allergy, Anemia, Arthritis, Asthma, Cancer (HCC) (2021), Cataract, Chronic kidney disease (1997), Dementia (HCC), Diabetes mellitus, Diverticulosis, Dyslipidemia, Dyspnea, Dysrhythmia, Endometrial cancer (HCC) (2021), Family history of adverse reaction to anesthesia, Fibromyalgia (1980's), Generalized anxiety disorder, GERD (gastroesophageal reflux disease), Heart murmur, Hyperlipemia, Hypertension, Hypothyroidism, Hypothyroidism (acquired), Internal hemorrhoids, Irritable bowel syndrome (1980's), Liver cancer (HCC), Major depressive disorder, Migraine headaches (1980's), Mild neurocognitive disorder (10/05/2019), Morbid obesity (HCC), Pernicious anemia, Pneumonia (2009), PONV (postoperative nausea and vomiting), Recurrent upper respiratory infection (URI), Sleep apnea, Subarachnoid hemorrhage (HCC) (2010), and Vaginal polyp.   Past Surgical History: She  has a past surgical history that includes Dilation and curettage of uterus (1972); Cholecystectomy (1983); Hysteroscopy with D & C (06/29/2011); Colonoscopy (2008); Robotic assisted total hysterectomy with bilateral salpingo oophorectomy (N/A, 10/27/2020); Sentinel node biopsy (N/A, 10/27/2020); and Cataract extraction (Right).   Medications: She has a  current medication list which includes the following prescription(s): amlodipine, vitamin d3, cyanocobalamin, diclofenac sodium, dicyclomine, estradiol, febuxostat, accu-chek guide, levothyroxine, magnesium citrate, mirtazapine, multivitamin with minerals, na sulfate-k sulfate-mg sulf, pantoprazole, pregabalin, repatha sureclick, systane hydration pf, and eplerenone, and the following Facility-Administered Medications: cyanocobalamin.   Allergies: Patient is allergic to atenolol, atorvastatin, canagliflozin, losartan potassium, acarbose, cinnamon, empagliflozin, glimepiride, metformin and related, other, pioglitazone, pravastatin, sitagliptin, spironolactone-hctz, statins, sulfamethoxazole, tramadol hcl, atorvastatin calcium, augmentin [amoxicillin-pot clavulanate], crestor [rosuvastatin], rosuvastatin calcium, torsemide, bumex [bumetanide], lasix [furosemide], and sulfa antibiotics.   Social History: Patient  reports that she has never smoked. She has never used smokeless tobacco. She reports that she does not currently use alcohol. She reports that she does not use drugs.      OBJECTIVE     Physical Exam: Vitals:   09/17/23 0840 09/17/23 0850  BP: (!) 150/81 (!) 143/71  Pulse: (!) 103 (!) 101    Gen: No apparent distress, A&O x 3.  Detailed Urogynecologic Evaluation:  Normal external genitalia. Speculum exam shows normal mucosa with atrophy.   #5 ring with support pessary placed. It was slightly uncomfortable and protruded. A #4 ring with support was placed. It was comfortable, fit well, and stayed in placed with strong cough, valsalva and bending. Lot # [id number]     ASSESSMENT AND PLAN    Ms. Cadenhead is a 72 y.o. with:  1. Prolapse of anterior vaginal wall   2. Vaginal atrophy     - #4 RWS Pessary placed today for prolapse.  She will keep in place.  - She does not think she can do the estrogen cream regularly, previously prescribed and not using. Therefore, will order estring  vaginal ring to be placed with pessary. Instructed patient to bring to next appt. Also can use aquaphor or coconut oil  on other days for moisture as needed.   Follow up  in 2 weeks for pessary check  Marguerita Beards, MD

## 2023-09-17 NOTE — Patient Instructions (Signed)
Pick up the estrogen ring from the pharmacy and bring to next appointment.   You were fitted with a #4 ring pessary. Leave in place until your next visit.   Can use aquaphor or coconut oil on the vulva and vagina for moisture.

## 2023-09-18 ENCOUNTER — Other Ambulatory Visit (INDEPENDENT_AMBULATORY_CARE_PROVIDER_SITE_OTHER): Payer: 59

## 2023-09-18 ENCOUNTER — Encounter: Payer: Self-pay | Admitting: Gastroenterology

## 2023-09-18 ENCOUNTER — Ambulatory Visit (INDEPENDENT_AMBULATORY_CARE_PROVIDER_SITE_OTHER): Payer: 59 | Admitting: Gastroenterology

## 2023-09-18 VITALS — BP 118/70 | HR 78 | Ht 62.0 in | Wt 189.0 lb

## 2023-09-18 DIAGNOSIS — R159 Full incontinence of feces: Secondary | ICD-10-CM | POA: Diagnosis not present

## 2023-09-18 DIAGNOSIS — R197 Diarrhea, unspecified: Secondary | ICD-10-CM | POA: Diagnosis not present

## 2023-09-18 DIAGNOSIS — R109 Unspecified abdominal pain: Secondary | ICD-10-CM | POA: Diagnosis not present

## 2023-09-18 LAB — CBC WITH DIFFERENTIAL/PLATELET
Basophils Absolute: 0 10*3/uL (ref 0.0–0.1)
Basophils Relative: 0.4 % (ref 0.0–3.0)
Eosinophils Absolute: 0.2 10*3/uL (ref 0.0–0.7)
Eosinophils Relative: 2.6 % (ref 0.0–5.0)
HCT: 35.1 % — ABNORMAL LOW (ref 36.0–46.0)
Hemoglobin: 11.6 g/dL — ABNORMAL LOW (ref 12.0–15.0)
Lymphocytes Relative: 18.4 % (ref 12.0–46.0)
Lymphs Abs: 1.1 10*3/uL (ref 0.7–4.0)
MCHC: 33.1 g/dL (ref 30.0–36.0)
MCV: 89.9 fL (ref 78.0–100.0)
Monocytes Absolute: 0.5 10*3/uL (ref 0.1–1.0)
Monocytes Relative: 8.8 % (ref 3.0–12.0)
Neutro Abs: 4.2 10*3/uL (ref 1.4–7.7)
Neutrophils Relative %: 69.8 % (ref 43.0–77.0)
Platelets: 210 10*3/uL (ref 150.0–400.0)
RBC: 3.9 Mil/uL (ref 3.87–5.11)
RDW: 14.2 % (ref 11.5–15.5)
WBC: 6 10*3/uL (ref 4.0–10.5)

## 2023-09-18 LAB — HEPATIC FUNCTION PANEL
ALT: 8 U/L (ref 0–35)
AST: 14 U/L (ref 0–37)
Albumin: 4 g/dL (ref 3.5–5.2)
Alkaline Phosphatase: 58 U/L (ref 39–117)
Bilirubin, Direct: 0.2 mg/dL (ref 0.0–0.3)
Total Bilirubin: 0.5 mg/dL (ref 0.2–1.2)
Total Protein: 7 g/dL (ref 6.0–8.3)

## 2023-09-18 NOTE — Patient Instructions (Signed)
Your provider has requested that you go to the basement level for lab work before leaving today. Press "B" on the elevator. The lab is located at the first door on the left as you exit the elevator.   Please collect Hemoccult samples over 3 separate days and return with other stool studies.  _______________________________________________________  If your blood pressure at your visit was 140/90 or greater, please contact your primary care physician to follow up on this.  _______________________________________________________  If you are age 45 or older, your body mass index should be between 23-30. Your Body mass index is 34.57 kg/m. If this is out of the aforementioned range listed, please consider follow up with your Primary Care Provider.  If you are age 50 or younger, your body mass index should be between 19-25. Your Body mass index is 34.57 kg/m. If this is out of the aformentioned range listed, please consider follow up with your Primary Care Provider.   ________________________________________________________  The [facility] providers would like to encourage you to use [facility] to communicate with providers for non-urgent requests or questions.  Due to long hold times on the telephone, sending your provider a message by [facility] may be a faster and more efficient way to get a response.  Please allow 48 business hours for a response.  Please remember that this is for non-urgent requests.  _______________________________________________________

## 2023-09-18 NOTE — Progress Notes (Signed)
09/18/2023 [name] [id number] 02/27/1951   HISTORY OF PRESENT ILLNESS: This is a 72 year old female who is a patient of Dr. Elana Alm.  She has a history of diarrhea thought to be due to anxiety and IBS, usually alternating between diarrhea and constipation although she says usually diarrhea predominant.  She was last seen here in May 2024 by one of our other PAs.  She is here today with complaints of worsening diarrhea.  She expresses her frustration with the fact that she always has to see a PA and she has not seen Dr. Lavon Paganini in a long time.  Says that she did not like the nurse that she spoke with last on the phone.  She tells me that she has been having worsening of her diarrhea for the past 6 to 8 weeks.  She has fecal incontinence most of the time.  Does not see any blood in her stool.  Does not recall being on antibiotics since the Augmentin with the drug-induced liver injury earlier this year.  She keeps talking about a tremendous amount of weight gain, but it looks like her weight stable at least dating back to May.  She is overdue for colonoscopy and it was recommended maybe she have an endoscopy as well to evaluate anemia, but she is reluctant to have either of those procedures performed but she is concerned about her memory, etc. after being put to sleep.  05/2018 colonoscopy 2 small sessile polyps removed 1 tubular adenoma, diverticulosis, internal hemorrhoid, recall 5 years. (05/2023) 03/06/2021 Labs negative GI pathogen, O&P, fecal elastase 11/2021 CT ab and pelvis LLQ pain- tiny recal calculi, no metatic carcinoma, diverticulosi,  02/08/2022 hemoglobin 10.5, 4.0, MCV 89.6, platelets 268 05/30/2022 seen patient for worsening diarrhea, off amtitripyline and started on gabapentin, Cdiff negative, thyroid normal. Positive yersinia enterocolitica, given Cipro 500 mg twice daily for 5 days Felt better while on ABX 06/22/2022 office visit with myself for diarrhea then  constipation potential pelvic floor component  Iron 41, ferritin 45, saturation ratio 10.4, hemoglobin 12 CRP unremarkable   08/09/2022 office visit with Dr. Lavon Paganini for IBS overall doing better with antibiotics and dietary change as BM every 1 to 3 days denies rectal bleeding.    Continued on dicyclomine, lactose-free diet.  01/16/2023 office visit with myself for constipation for 2 weeks She had normal iron, ferritin, B12 normal range.   Sed rate was unremarkable, no leukocytosis.  Kidney and liver normal 02/26/2023 office visit with PCP for 3 to 5 days of abdominal pain and diarrhea. 03/05/2023 CT abdomen pelvis shows diverticulitis without any complications.   Started on Augmentin for 10 days. Had 5 days extension of Augmentin. Labs 3/28 showed normal LFTs normal CBC.  Developed pruritic jaundice secondary to DILI and hospitilized.   AST 154, ALT 149, total bilirubin 8.1.  CT abdomen/pelvis unrevealing. 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 name of MD    Dementia (HCC)    Diabetes mellitus  type 2    Diverticulosis    Dyslipidemia    Dyspnea    with exertion    Dysrhythmia    "skips beats"   Endometrial cancer (HCC) 2021   Family history of adverse reaction to anesthesia    son stopped breathing during surgery , mother slow to wake up    Fibromyalgia 1980's   Generalized anxiety disorder    GERD (gastroesophageal reflux disease)    occ- will use Tums and Prolosec  prn    Heart murmur    Hyperlipemia    Hypertension    not on blood pressure meds due to allergies - last dose of meds- months ago     Hypothyroidism    Hypothyroidism (acquired)    Internal hemorrhoids    Irritable bowel syndrome 1980's   Liver cancer (HCC)    Major depressive disorder    Migraine headaches 1980's   On meds-well controlled   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   Morbid obesity (HCC)    Pernicious anemia    Pneumonia 2009   Several times over the past several years   PONV (postoperative nausea and vomiting)    was told by neurologist due to calcificaton in brain not to be put to sleep, N/V during Op and Recovery    Recurrent upper respiratory infection (URI)    Sleep apnea    no cpap   Subarachnoid hemorrhage (HCC) 2010   Vaginal polyp    benign per pt   Past Surgical History:  Procedure Laterality Date   CATARACT EXTRACTION Right    CHOLECYSTECTOMY  1983   COLONOSCOPY  2008   DB   DILATION AND CURETTAGE OF UTERUS  1972   HYSTEROSCOPY WITH D & C  06/29/2011   Procedure: DILATATION AND CURETTAGE (D&C) /HYSTEROSCOPY;  Surgeon: Reva Bores, MD;  Location: [facility];  Service: Gynecology;  Laterality: N/A;   ROBOTIC ASSISTED TOTAL HYSTERECTOMY WITH BILATERAL SALPINGO OOPHERECTOMY N/A 10/27/2020   Procedure: XI ROBOTIC ASSISTED TOTAL HYSTERECTOMY WITH BILATERAL SALPINGO OOPHORECTOMY;  Surgeon: Adolphus Birchwood, MD;  Location: [facility];  Service: Gynecology;  Laterality: N/A;   SENTINEL NODE BIOPSY N/A 10/27/2020   Procedure: SENTINEL NODE BIOPSY;  Surgeon: Adolphus Birchwood, MD;  Location: [facility];  Service: Gynecology;  Laterality: N/A;    reports that she has never smoked. She has never used smokeless tobacco. She reports that she does not currently use alcohol. She reports that she does not use drugs. family history includes Anesthesia problems in her granddaughter, mother, and son; Arthritis in an other family member; Bladder Cancer in her father; Cerebrovascular Disease in her mother; Colon cancer in her father; Diabetes in an other family member; High blood pressure in an other family member; Liver cancer  in her father; Schizophrenia in her child and daughter; Tremor in her child and son. Allergies  Allergen Reactions   Atenolol Other (See Comments)    Stomach problems, chills   Atorvastatin Other (See Comments)    Retained fluid   Canagliflozin Other (See Comments)    ALL DIABETIC MEDS PER PT   abdominal pain, headache   Losartan Potassium Other (See Comments)    chills, HA, stomach pain   Acarbose Nausea Only and Other (See Comments)     headache,nausea   Cinnamon Other (See Comments)    Stomach pain   Empagliflozin Other (See Comments)     headaches   Glimepiride Other (See Comments)    ALL DIABETIC MEDS PER PT  abdominal pain,  headache   Metformin And Related Nausea And Vomiting and Other (See Comments)    Patient states that she has severe chills, headache and cramping additionally. Says blood sugar is uncontrolled because of this   Other Other (See Comments)    ALL DIABETIC MEDS PER PT    Pioglitazone Other (See Comments)    ALL DIABETIC MEDS PER PT = peripheral edema   Pravastatin Other (See Comments)    Retained fluid   Sitagliptin Other (See Comments)    ALL DIABETIC MEDS PER PT  Other reaction(s): headache   Spironolactone-Hctz Other (See Comments)   Statins Other (See Comments)    Myalgia   Sulfamethoxazole Other (See Comments)    ALL DIABETIC MEDS PER PT    Tramadol Hcl Itching   Atorvastatin Calcium Other (See Comments)     retain fld   Augmentin [Amoxicillin-Pot Clavulanate] Other (See Comments)    Caused jaundice   Crestor [Rosuvastatin] Other (See Comments)   Rosuvastatin Calcium    Torsemide Nausea Only    Gi upset, chills, abd pain,bloating   Bumex [Bumetanide] Rash and Other (See Comments)    Chills, rash, headaches, stomach issues also   Lasix [Furosemide] Rash and Other (See Comments)    Sugars increased   Sulfa Antibiotics Rash and Other (See Comments)    Mother has told patient in the past not to take, she cannot recall there reaction       Outpatient Encounter Medications as of 09/18/2023  Medication Sig   Cholecalciferol (VITAMIN D3) 25 MCG (1000 UT) capsule Take 1,000 Units by mouth daily.   cyanocobalamin (,VITAMIN B-12,) 1000 MCG/ML injection Inject 1,000 mcg into the muscle every 30 (thirty) days.    eplerenone (INSPRA) 25 MG tablet Take 1 tablet (25 mg total) by mouth daily.   estradiol (ESTRING) 7.5 MCG/24HR vaginal ring Place 1 each vaginally every 3 (three) months.   glucose blood (ACCU-CHEK GUIDE) test strip Use to test blood sugar 3 times daily   levothyroxine (SYNTHROID) 50 MCG tablet Take 1 tablet (50 mcg total) by mouth daily before breakfast.   MAGNESIUM CITRATE PO Take 1 tablet by mouth 2 (two) times daily.   pantoprazole (PROTONIX) 40 MG tablet Take 40 mg by mouth daily.   pregabalin (LYRICA) 50 MG capsule Take 50 mg by mouth as needed ((for pain).).   REPATHA SURECLICK 140 MG/ML SOAJ INJECT 1 PEN SUBCUTANEOUSLY EVERY 14 DAYS   SYSTANE HYDRATION PF 0.4-0.3 % SOLN Place 1-2 drops into both eyes in the morning.   amLODipine (NORVASC) 2.5 MG tablet Take 1 tablet (2.5 mg total) by mouth daily. (Patient not taking: Reported on 09/18/2023)   diclofenac Sodium (VOLTAREN) 1 % GEL Apply 2 g topically 4 (four) times daily. (Patient not taking: Reported on 09/18/2023)   dicyclomine (BENTYL) 20 MG tablet Take 1 tablet (20 mg total) by mouth every 8 (eight) hours as needed for spasms (AB pain). (Patient not taking: Reported on 09/18/2023)   febuxostat (ULORIC) 40 MG tablet Take 1 tablet (40 mg total) by mouth daily. (Patient not taking: Reported on 09/18/2023)   mirtazapine (REMERON) 15 MG tablet Take 1 tablet (15 mg total) by mouth at bedtime. (Patient not taking: Reported on 09/18/2023)   Multiple Vitamin (MULTIVITAMIN WITH MINERALS) TABS tablet Take 1 tablet by mouth daily. (Patient not taking: Reported on 09/18/2023)   Na Sulfate-K Sulfate-Mg Sulf 17.5-3.13-1.6 GM/177ML SOLN  (Patient not taking: Reported on 09/18/2023)    Facility-Administered Encounter Medications as of 09/18/2023  Medication  cyanocobalamin (VITAMIN B12) injection 1,000 mcg    REVIEW OF SYSTEMS  : All other systems reviewed and negative except where noted in the History of Present Illness.   PHYSICAL EXAM: BP 118/70   Pulse 78   Ht 5\' 2"  (1.575 m)   Wt 189 lb (85.7 kg)   BMI 34.57 kg/m  General: Well developed white female in no acute distress Head: Normocephalic and atraumatic Eyes:  Sclerae anicteric, conjunctiva pink. Ears: Normal auditory acuity Lungs: Clear throughout to auscultation; no W/R/R. Heart: Regular rate and rhythm; no M/R/G. Abdomen: Soft, non-distended.  BS present.  Non-tender. Musculoskeletal: Symmetrical with no gross deformities  Skin: No lesions on visible extremities Extremities: No edema  Neurological: Alert oriented x 4, grossly non-focal Psychological:  Alert and cooperative. Normal mood and affect  ASSESSMENT AND PLAN: *Diarrhea with incontinence: Describes worsening diarrhea with very frequent incontinence that has worsened over the past 6 to 8 weeks.  Will check a GI pathogen panel to rule out infectious source.  Likely does have some underlying IBS and does I am sure has some pelvic floor weakness as she has vaginal prolapse as well.  I did suggest considering pelvic floor physical therapy. *Anemia: Hemoglobin 11.1 g in August.  Will check stool Hemoccult cards x 3.  **Patient is overdue for colonoscopy for history of adenomatous colon polyps.  It has also been recommended that she consider endoscopy as well to evaluate her anemia.  Patient is reluctant to have either of those procedures.  Will check Hemoccult cards as above and rule out infectious source for cause of her diarrhea.  If negative once again she likely does have IBS related to her anxiety with pelvic floor weakness causing issues with her fecal incontinence, but could rule out microscopic colitis, etc. with colonoscopy.  She has an  appointment with Dr. Lavon Paganini next month at which time she can rediscuss these procedures with her.   CC:  Jeani Sow, MD

## 2023-09-24 ENCOUNTER — Ambulatory Visit: Payer: 59

## 2023-09-24 DIAGNOSIS — Z0389 Encounter for observation for other suspected diseases and conditions ruled out: Secondary | ICD-10-CM | POA: Diagnosis not present

## 2023-09-24 DIAGNOSIS — A048 Other specified bacterial intestinal infections: Secondary | ICD-10-CM | POA: Diagnosis not present

## 2023-09-24 DIAGNOSIS — R197 Diarrhea, unspecified: Secondary | ICD-10-CM | POA: Diagnosis not present

## 2023-09-26 ENCOUNTER — Institutional Professional Consult (permissible substitution) (HOSPITAL_BASED_OUTPATIENT_CLINIC_OR_DEPARTMENT_OTHER): Payer: 59 | Admitting: Family

## 2023-09-26 LAB — GI PROFILE, STOOL, PCR

## 2023-09-30 ENCOUNTER — Telehealth: Payer: Self-pay | Admitting: Gastroenterology

## 2023-09-30 NOTE — Telephone Encounter (Signed)
Inbound call from patient requesting a call to discuss stool kit test that she brought in on 10/2. Please advise, thank you.

## 2023-09-30 NOTE — Telephone Encounter (Signed)
Test results not read as of today.   Left message on machine to call back

## 2023-09-30 NOTE — Telephone Encounter (Signed)
Inbound call from patient, advised her results had not been read yet.

## 2023-09-30 NOTE — Telephone Encounter (Signed)
Noted pt will be called as soon as able

## 2023-10-02 ENCOUNTER — Ambulatory Visit (INDEPENDENT_AMBULATORY_CARE_PROVIDER_SITE_OTHER): Payer: 59 | Admitting: Obstetrics and Gynecology

## 2023-10-02 ENCOUNTER — Encounter: Payer: Self-pay | Admitting: Obstetrics and Gynecology

## 2023-10-02 VITALS — BP 146/80 | HR 85

## 2023-10-02 DIAGNOSIS — N812 Incomplete uterovaginal prolapse: Secondary | ICD-10-CM

## 2023-10-02 DIAGNOSIS — N952 Postmenopausal atrophic vaginitis: Secondary | ICD-10-CM

## 2023-10-02 DIAGNOSIS — N993 Prolapse of vaginal vault after hysterectomy: Secondary | ICD-10-CM

## 2023-10-02 DIAGNOSIS — N8110 Cystocele, unspecified: Secondary | ICD-10-CM

## 2023-10-02 NOTE — Progress Notes (Signed)
[facility]   Subjective:     Chief Complaint:  Chief Complaint  Patient presents with   Pessary Check    [name] is a 72 y.o. female here for a pessary check.    History of Present Illness: [name] is a 71 y.o. female with stage II pelvic organ prolapse who presents for a pessary check. She is using a size #4 ring with support pessary. The pessary has been working well and she has no complaints. She is not using vaginal estrogen. She denies vaginal bleeding.  Past Medical History: Patient  has a past medical history of Allergy, Anemia, Arthritis, Asthma, Cancer (HCC) (2021), Cataract, Chronic kidney disease (1997), Dementia (HCC), Diabetes mellitus, Diverticulosis, Dyslipidemia, Dyspnea, Dysrhythmia, Endometrial cancer (HCC) (2021), Family history of adverse reaction to anesthesia, Fibromyalgia (1980's), Generalized anxiety disorder, GERD (gastroesophageal reflux disease), Heart murmur, Hyperlipemia, Hypertension, Hypothyroidism, Hypothyroidism (acquired), Internal hemorrhoids, Irritable bowel syndrome (1980's), Liver cancer (HCC), Major depressive disorder, Migraine headaches (1980's), Mild neurocognitive disorder (10/05/2019), Morbid obesity (HCC), Pernicious anemia, Pneumonia (2009), PONV (postoperative nausea and vomiting), Recurrent upper respiratory infection (URI), Sleep apnea, Subarachnoid hemorrhage (HCC) (2010), and Vaginal polyp.   Past Surgical History: She  has a past surgical history that includes Dilation and curettage of uterus (1972); Cholecystectomy (1983); Hysteroscopy with D & C (06/29/2011); Colonoscopy (2008); Robotic assisted total hysterectomy with bilateral salpingo oophorectomy (N/A, 10/27/2020); Sentinel node biopsy (N/A, 10/27/2020); and Cataract extraction (Right).   Medications: She has a current medication list which includes the following prescription(s): vitamin d3, cyanocobalamin, eplerenone, estradiol, accu-chek guide,  levothyroxine, magnesium citrate, pantoprazole, pregabalin, repatha sureclick, systane hydration pf, febuxostat, multivitamin with minerals, and na sulfate-k sulfate-mg sulf, and the following Facility-Administered Medications: cyanocobalamin.   Allergies: Patient is allergic to atenolol, atorvastatin, canagliflozin, losartan potassium, acarbose, cinnamon, empagliflozin, glimepiride, metformin and related, other, pioglitazone, pravastatin, sitagliptin, spironolactone-hctz, statins, sulfamethoxazole, tramadol hcl, atorvastatin calcium, augmentin [amoxicillin-pot clavulanate], crestor [rosuvastatin], rosuvastatin calcium, torsemide, bumex [bumetanide], lasix [furosemide], and sulfa antibiotics.   Social History: Patient  reports that she has never smoked. She has never used smokeless tobacco. She reports that she does not currently use alcohol. She reports that she does not use drugs.      Objective:    Physical Exam: BP (!) 146/80   Pulse 85  Gen: No apparent distress, A&O x 3. Detailed Urogynecologic Evaluation:  Pelvic Exam: Normal external female genitalia; Bartholin's and Skene's glands normal in appearance; urethral meatus normal in appearance, no urethral masses or discharge. Applied lidocaine cream to vaginal opening prior to exam for patient comfort.  The pessary was noted to be in place. It was removed and cleaned. Speculum exam revealed no lesions in the vagina. The pessary was replaced. It was comfortable to the patient and fit well.    Assessment/Plan:    Assessment: Ms. Brymer is a 72 y.o. with stage II pelvic organ prolapse here for a pessary check. She is doing well.  Plan: She will keep the pessary in place until next visit. She will start to use estrogen. She will follow-up in 3 months for a pessary check or sooner as needed.  All questions were answered.

## 2023-10-02 NOTE — Patient Instructions (Signed)
Coconut oil Vitamin E cream

## 2023-10-07 ENCOUNTER — Ambulatory Visit (INDEPENDENT_AMBULATORY_CARE_PROVIDER_SITE_OTHER): Payer: 59 | Admitting: Family Medicine

## 2023-10-07 ENCOUNTER — Encounter: Payer: Self-pay | Admitting: Family Medicine

## 2023-10-07 ENCOUNTER — Encounter: Payer: Self-pay | Admitting: Pharmacist

## 2023-10-07 VITALS — BP 130/80 | HR 83 | Temp 98.2°F | Resp 18 | Ht 62.0 in | Wt 189.2 lb

## 2023-10-07 DIAGNOSIS — R600 Localized edema: Secondary | ICD-10-CM

## 2023-10-07 DIAGNOSIS — E538 Deficiency of other specified B group vitamins: Secondary | ICD-10-CM

## 2023-10-07 DIAGNOSIS — E1122 Type 2 diabetes mellitus with diabetic chronic kidney disease: Secondary | ICD-10-CM | POA: Diagnosis not present

## 2023-10-07 DIAGNOSIS — N1831 Chronic kidney disease, stage 3a: Secondary | ICD-10-CM

## 2023-10-07 DIAGNOSIS — E782 Mixed hyperlipidemia: Secondary | ICD-10-CM | POA: Diagnosis not present

## 2023-10-07 DIAGNOSIS — I10 Essential (primary) hypertension: Secondary | ICD-10-CM | POA: Diagnosis not present

## 2023-10-07 DIAGNOSIS — F439 Reaction to severe stress, unspecified: Secondary | ICD-10-CM

## 2023-10-07 MED ORDER — CYANOCOBALAMIN 1000 MCG/ML IJ SOLN
1000.0000 ug | Freq: Once | INTRAMUSCULAR | Status: AC
Start: 2023-10-07 — End: 2023-10-07
  Administered 2023-10-07: 1000 ug via INTRAMUSCULAR

## 2023-10-07 MED ORDER — TORSEMIDE 20 MG PO TABS
20.0000 mg | ORAL_TABLET | Freq: Every day | ORAL | 1 refills | Status: DC
Start: 2023-10-07 — End: 2023-11-28

## 2023-10-07 MED ORDER — POTASSIUM CHLORIDE CRYS ER 20 MEQ PO TBCR
20.0000 meq | EXTENDED_RELEASE_TABLET | Freq: Every day | ORAL | 1 refills | Status: DC
Start: 2023-10-07 — End: 2023-11-25

## 2023-10-07 NOTE — Assessment & Plan Note (Signed)
Chronic.  Patient gets monthly B12 injections.  Continue

## 2023-10-07 NOTE — Assessment & Plan Note (Signed)
Chronic.  Mult drug intolerances.  Use torsemide 20 mg daily as needed.  Controlled right now.  Add back K daily

## 2023-10-07 NOTE — Progress Notes (Signed)
Subjective:     Patient ID: [name], female    DOB: [birth year], 72 y.o.   MRN: [id number]  Chief Complaint  Patient presents with   Medical Management of Chronic Issues    HPI  Edema - Pt is on Torsemide 20 mg once daily. Tolerating better again.  Less edema.  Ran out of rx K so taking OTC  HTN - Pt is not taking inspra 25 mg. She states her systolic BP tends to be in the 160s at doctor's visits. Is established with cardiology. No ha/dizziness/cp/palp/edema/cough/sob.  DM, type 2 - She reports recent weight gain.  Intolerant of meds, so eating very little, less than 500 calories a day. There are days where she endorses binge eating. Has intolerances and side effects to multiple medications.   Mood / Depression - Has had many family life stressors recently. She is hesitant to start antidepressants given she has intolerances to many medications.  GERD - Pt taking pantoprazole as needed. States her dosage varies. Some days not taking, some days 20 mg, and some 40 mg.   Gout -  Not Taking febuxostat 40 mg daily for gout prevention-side effects.   Hypothyroidism - Taking synthroid 50 mcg once daily.   HLD - She is taking repatha   B12 deficiency - Has been receiving B12 injections every months.   Cataracts - Has cataracts surgery on left eye scheduled tomorrow. Not taking injections in her eye for macular degeneration.   Not taking magnesium citrate due to diarrhea. States she will plan to start.    Health Maintenance Due  Topic Date Due   Medicare Annual Wellness (AWV)  Never done   OPHTHALMOLOGY EXAM  Never done   Colonoscopy  06/04/2023    Past Medical History:  Diagnosis Date   Allergy    Anemia    Arthritis    Asthma    Dianosed years ago-no meds at this time   Cancer [facility]) 2021   endometrial    Cataract    Right eye removed-still has left cataract    Chronic kidney disease 1997   Nepthrotic syndrom with minimal change-in remission per pt - last seen  by kidney MD- 2 mos ago ? name of MD    Dementia (HCC)    Diabetes mellitus    type 2    Diverticulosis    Dyslipidemia    Dyspnea    with exertion    Dysrhythmia    "skips beats"   Endometrial cancer (HCC) 2021   Family history of adverse reaction to anesthesia    son stopped breathing during surgery , mother slow to wake up    Fibromyalgia 1980's   Generalized anxiety disorder    GERD (gastroesophageal reflux disease)    occ- will use Tums and Prolosec  prn    Heart murmur    Hyperlipemia    Hypertension    not on blood pressure meds due to allergies - last dose of meds- months ago    Hypothyroidism    Hypothyroidism (acquired)    Internal hemorrhoids    Irritable bowel syndrome 1980's   Liver cancer (HCC)    Major depressive disorder    Migraine headaches 1980's   On meds-well controlled   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   Morbid obesity (HCC)    Pernicious anemia    Pneumonia 2009   Several times over the past several years   PONV (postoperative nausea and vomiting)    was told by  neurologist due to calcificaton in brain not to be put to sleep, N/V during Op and Recovery    Recurrent upper respiratory infection (URI)    Sleep apnea    no cpap   Subarachnoid hemorrhage (HCC) 2010   Vaginal polyp    benign per pt    Past Surgical History:  Procedure Laterality Date   CATARACT EXTRACTION Right    CHOLECYSTECTOMY  1983   COLONOSCOPY  2008   DB   DILATION AND CURETTAGE OF UTERUS  1972   HYSTEROSCOPY WITH D & C  06/29/2011   Procedure: DILATATION AND CURETTAGE (D&C) /HYSTEROSCOPY;  Surgeon: Reva Bores, MD;  Location: [facility];  Service: Gynecology;  Laterality: N/A;   ROBOTIC ASSISTED TOTAL HYSTERECTOMY WITH BILATERAL SALPINGO OOPHERECTOMY N/A 10/27/2020   Procedure: XI ROBOTIC ASSISTED TOTAL HYSTERECTOMY WITH BILATERAL SALPINGO OOPHORECTOMY;  Surgeon: Adolphus Birchwood, MD;  Location: [facility];  Service: Gynecology;  Laterality: N/A;   SENTINEL NODE BIOPSY N/A  10/27/2020   Procedure: SENTINEL NODE BIOPSY;  Surgeon: Adolphus Birchwood, MD;  Location: [facility];  Service: Gynecology;  Laterality: N/A;     Current Outpatient Medications:    Cholecalciferol (VITAMIN D3) 25 MCG (1000 UT) capsule, Take 1,000 Units by mouth daily., Disp: , Rfl:    cyanocobalamin (,VITAMIN B-12,) 1000 MCG/ML injection, Inject 1,000 mcg into the muscle every 30 (thirty) days. , Disp: , Rfl:    estradiol (ESTRING) 7.5 MCG/24HR vaginal ring, Place 1 each vaginally every 3 (three) months., Disp: 1 each, Rfl: 3   glucose blood (ACCU-CHEK GUIDE) test strip, Use to test blood sugar 3 times daily, Disp: 100 each, Rfl: 2   levothyroxine (SYNTHROID) 50 MCG tablet, Take 1 tablet (50 mcg total) by mouth daily before breakfast., Disp: 90 tablet, Rfl: 3   MAGNESIUM CITRATE PO, Take 1 tablet by mouth 2 (two) times daily., Disp: , Rfl:    Multiple Vitamin (MULTIVITAMIN WITH MINERALS) TABS tablet, Take 1 tablet by mouth daily., Disp: 30 tablet, Rfl: 0   Na Sulfate-K Sulfate-Mg Sulf 17.5-3.13-1.6 GM/177ML SOLN, , Disp: , Rfl:    pantoprazole (PROTONIX) 40 MG tablet, Take 40 mg by mouth daily., Disp: , Rfl:    potassium chloride SA (KLOR-CON M) 20 MEQ tablet, Take 1 tablet (20 mEq total) by mouth daily., Disp: 90 tablet, Rfl: 1   pregabalin (LYRICA) 50 MG capsule, Take 50 mg by mouth as needed ((for pain).)., Disp: , Rfl:    REPATHA SURECLICK 140 MG/ML SOAJ, INJECT 1 PEN SUBCUTANEOUSLY EVERY 14 DAYS, Disp: 6 mL, Rfl: 3   SYSTANE HYDRATION PF 0.4-0.3 % SOLN, Place 1-2 drops into both eyes in the morning., Disp: , Rfl:    torsemide (DEMADEX) 20 MG tablet, Take 1 tablet (20 mg total) by mouth daily., Disp: 90 tablet, Rfl: 1  Current Facility-Administered Medications:    cyanocobalamin (VITAMIN B12) injection 1,000 mcg, 1,000 mcg, Intramuscular, Q30 days, Jeani Sow, MD, 1,000 mcg at 06/04/23 1354  Allergies  Allergen Reactions   Atenolol Other (See Comments)    Stomach problems, chills    Atorvastatin Other (See Comments)    Retained fluid   Canagliflozin Other (See Comments)    ALL DIABETIC MEDS PER PT   abdominal pain, headache   Losartan Potassium Other (See Comments)    chills, HA, stomach pain   Acarbose Nausea Only and Other (See Comments)     headache,nausea   Cinnamon Other (See Comments)    Stomach pain   Empagliflozin Other (See Comments)  headaches   Glimepiride Other (See Comments)    ALL DIABETIC MEDS PER PT  abdominal pain, headache   Metformin And Related Nausea And Vomiting and Other (See Comments)    Patient states that she has severe chills, headache and cramping additionally. Says blood sugar is uncontrolled because of this   Other Other (See Comments)    ALL DIABETIC MEDS PER PT    Pioglitazone Other (See Comments)    ALL DIABETIC MEDS PER PT = peripheral edema   Pravastatin Other (See Comments)    Retained fluid   Sitagliptin Other (See Comments)    ALL DIABETIC MEDS PER PT  Other reaction(s): headache   Spironolactone-Hctz Other (See Comments)   Statins Other (See Comments)    Myalgia   Sulfamethoxazole Other (See Comments)    ALL DIABETIC MEDS PER PT    Tramadol Hcl Itching   Atorvastatin Calcium Other (See Comments)     retain fld   Augmentin [Amoxicillin-Pot Clavulanate] Other (See Comments)    Caused jaundice   Crestor [Rosuvastatin] Other (See Comments)   Rosuvastatin Calcium    Torsemide Nausea Only    Gi upset, chills, abd pain,bloating   Bumex [Bumetanide] Rash and Other (See Comments)    Chills, rash, headaches, stomach issues also   Lasix [Furosemide] Rash and Other (See Comments)    Sugars increased   Sulfa Antibiotics Rash and Other (See Comments)    Mother has told patient in the past not to take, she cannot recall there reaction   ROS neg/noncontributory except as noted HPI/below      Objective:     BP 130/80   Pulse 83   Temp 98.2 F (36.8 C) (Temporal)   Resp 18   Ht 5\' 2"  (1.575 m)   Wt 189 lb 4 oz  (85.8 kg)   SpO2 99%   BMI 34.61 kg/m  Wt Readings from Last 3 Encounters:  10/07/23 189 lb 4 oz (85.8 kg)  09/18/23 189 lb (85.7 kg)  08/14/23 196 lb 12.8 oz (89.3 kg)    Physical Exam   Gen: WDWN NAD HEENT: NCAT, conjunctiva not injected, sclera nonicteric NECK:  supple, no thyromegaly, no nodes, no carotid bruits CARDIAC: RRR, S1S2+, no murmur. DP 1+B LUNGS: CTAB. No wheezes ABDOMEN:  BS+, soft, NTND, No HSM, no masses EXT:  no edema MSK: no gross abnormalities.  NEURO: A&O x3.  CN II-XII intact.  PSYCH: normal mood. Good eye contact     Assessment & Plan:  Local edema Assessment & Plan: Chronic.  Mult drug intolerances.  Use torsemide 20 mg daily as needed.  Controlled right now.  Add back K daily  Orders: -     Potassium Chloride Crys ER; Take 1 tablet (20 mEq total) by mouth daily.  Dispense: 90 tablet; Refill: 1 -     Torsemide; Take 1 tablet (20 mg total) by mouth daily.  Dispense: 90 tablet; Refill: 1 -     Basic metabolic panel -     Hemoglobin A1c -     Magnesium  Type 2 diabetes mellitus with stage 3a chronic kidney disease, without long-term current use of insulin (HCC) Assessment & Plan: Chronic.  Not ideal.  Intolerant of mult medications.  Needs to work on diet and is but only eating 500 calories/day.  Discussed not healthy and needs at least 1000 calories/day   Primary hypertension Assessment & Plan: Chronic.  Control varies.  She is intolerant of several medicines.  Going to the  hypertension clinic.  Today is controlled   B12 deficiency Assessment & Plan: Chronic.  Patient gets monthly B12 injections.  Continue  Orders: -     Cyanocobalamin  Mixed hyperlipidemia Assessment & Plan: Chronic.  Controlled.  Intolerant of several medications.  Repatha 140 mg every other week is working well.  Continue   Stress at home Assessment & Plan: Pt trying to cope but has a lot of stressors.  Not wanting meds     Return in about 4 weeks  (around 11/04/2023) for monthly appts w/me-f/u-sch 3 of them.Burnett Sheng, acting as a scribe for Angelena Sole, MD., have documented all relevant documentation on the behalf of Angelena Sole, MD, as directed by  Angelena Sole, MD while in the presence of Angelena Sole, MD.  I, Angelena Sole, MD, have reviewed all documentation for this visit. The documentation on 10/07/23 for the exam, diagnosis, procedures, and orders are all accurate and complete.    Angelena Sole, MD

## 2023-10-07 NOTE — Patient Instructions (Signed)
It was very nice to see you today!     PLEASE NOTE:  If you had any lab tests please let us know if you have not heard back within a few days. You may see your results on MyChart before we have a chance to review them but we will give you a call once they are reviewed by us. If we ordered any referrals today, please let us know if you have not heard from their office within the next week.   Please try these tips to maintain a healthy lifestyle:  Eat most of your calories during the day when you are active. Eliminate processed foods including packaged sweets (pies, cakes, cookies), reduce intake of potatoes, white bread, white pasta, and white rice. Look for whole grain options, oat flour or almond flour.  Each meal should contain half fruits/vegetables, one quarter protein, and one quarter carbs (no bigger than a computer mouse).  Cut down on sweet beverages. This includes juice, soda, and sweet tea. Also watch fruit intake, though this is a healthier sweet option, it still contains natural sugar! Limit to 3 servings daily.  Drink at least 1 glass of water with each meal and aim for at least 8 glasses per day  Exercise at least 150 minutes every week.   

## 2023-10-07 NOTE — Assessment & Plan Note (Signed)
Chronic.  Controlled.  Intolerant of several medications.  Repatha 140 mg every other week is working well.  Continue

## 2023-10-07 NOTE — Assessment & Plan Note (Signed)
Chronic.  Not ideal.  Intolerant of mult medications.  Needs to work on diet and is but only eating 500 calories/day.  Discussed not healthy and needs at least 1000 calories/day

## 2023-10-07 NOTE — Progress Notes (Signed)
Pharmacy Quality Measure Review  This patient is appearing on report for being at risk of failing the measure for Statin Therapy for Patients with Cardiovascular Disease (SPC) and Statin Use in Persons with Diabetes (SUPD) medications this calendar year.   Prior trials of: atorvastatin, rosuvastatin, pravastatin  Pt with documented intolerance to statins for myalgias and fluid retention. 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  Arbutus Leas, PharmD, BCPS Clinical Pharmacist [facility] [phone]

## 2023-10-07 NOTE — Assessment & Plan Note (Signed)
Pt trying to cope but has a lot of stressors.  Not wanting meds

## 2023-10-07 NOTE — Assessment & Plan Note (Signed)
Chronic.  Control varies.  She is intolerant of several medicines.  Going to the hypertension clinic.  Today is controlled

## 2023-10-08 ENCOUNTER — Other Ambulatory Visit: Payer: 59

## 2023-10-08 DIAGNOSIS — H25012 Cortical age-related cataract, left eye: Secondary | ICD-10-CM | POA: Diagnosis not present

## 2023-10-08 DIAGNOSIS — H25812 Combined forms of age-related cataract, left eye: Secondary | ICD-10-CM | POA: Diagnosis not present

## 2023-10-08 DIAGNOSIS — H2512 Age-related nuclear cataract, left eye: Secondary | ICD-10-CM | POA: Diagnosis not present

## 2023-10-08 LAB — BASIC METABOLIC PANEL
BUN: 22 mg/dL (ref 6–23)
CO2: 26 meq/L (ref 19–32)
Calcium: 9.2 mg/dL (ref 8.4–10.5)
Chloride: 103 meq/L (ref 96–112)
Creatinine, Ser: 1.39 mg/dL — ABNORMAL HIGH (ref 0.40–1.20)
GFR: 37.52 mL/min — ABNORMAL LOW (ref >60.00)
Glucose, Bld: 117 mg/dL — ABNORMAL HIGH (ref 70–99)
Potassium: 4 meq/L (ref 3.5–5.1)
Sodium: 140 meq/L (ref 135–145)

## 2023-10-08 LAB — MAGNESIUM: Magnesium: 1.7 mg/dL (ref 1.5–2.5)

## 2023-10-08 LAB — HEMOGLOBIN A1C: Hgb A1c MFr Bld: 6.8 % — ABNORMAL HIGH (ref 4.6–6.5)

## 2023-10-09 ENCOUNTER — Other Ambulatory Visit: Payer: Self-pay | Admitting: *Deleted

## 2023-10-09 ENCOUNTER — Other Ambulatory Visit (INDEPENDENT_AMBULATORY_CARE_PROVIDER_SITE_OTHER): Payer: 59

## 2023-10-09 ENCOUNTER — Telehealth: Payer: Self-pay | Admitting: Family Medicine

## 2023-10-09 DIAGNOSIS — R197 Diarrhea, unspecified: Secondary | ICD-10-CM | POA: Diagnosis not present

## 2023-10-09 DIAGNOSIS — R159 Full incontinence of feces: Secondary | ICD-10-CM | POA: Diagnosis not present

## 2023-10-09 DIAGNOSIS — D509 Iron deficiency anemia, unspecified: Secondary | ICD-10-CM

## 2023-10-09 LAB — HEMOCCULT SLIDES (X 3 CARDS)
Fecal Occult Blood: NEGATIVE
OCCULT 1: NEGATIVE
OCCULT 2: NEGATIVE
OCCULT 3: NEGATIVE
OCCULT 4: NEGATIVE
OCCULT 5: NEGATIVE

## 2023-10-09 NOTE — Progress Notes (Signed)
Potassium and magnesium are fine.  Kidney function is about the same (stable).  A1c has improved.  Just make sure you are getting enough to eat.

## 2023-10-09 NOTE — Telephone Encounter (Signed)
Pt would like a call back with lab results 

## 2023-10-09 NOTE — Telephone Encounter (Signed)
See message below °

## 2023-10-10 ENCOUNTER — Ambulatory Visit (HOSPITAL_BASED_OUTPATIENT_CLINIC_OR_DEPARTMENT_OTHER): Payer: 59 | Admitting: Family

## 2023-10-10 ENCOUNTER — Telehealth: Payer: Self-pay | Admitting: Gastroenterology

## 2023-10-10 ENCOUNTER — Encounter (HOSPITAL_BASED_OUTPATIENT_CLINIC_OR_DEPARTMENT_OTHER): Payer: Self-pay | Admitting: Family

## 2023-10-10 VITALS — BP 130/74 | HR 85 | Ht 62.0 in | Wt 188.9 lb

## 2023-10-10 DIAGNOSIS — I2510 Atherosclerotic heart disease of native coronary artery without angina pectoris: Secondary | ICD-10-CM

## 2023-10-10 DIAGNOSIS — I10 Essential (primary) hypertension: Secondary | ICD-10-CM | POA: Diagnosis not present

## 2023-10-10 DIAGNOSIS — G720 Drug-induced myopathy: Secondary | ICD-10-CM

## 2023-10-10 DIAGNOSIS — E785 Hyperlipidemia, unspecified: Secondary | ICD-10-CM | POA: Diagnosis not present

## 2023-10-10 NOTE — Telephone Encounter (Signed)
PT is calling to go over results of stool test. Please advise.

## 2023-10-10 NOTE — Telephone Encounter (Signed)
Patient contacted and given lab results and recommendations. Patient stated understanding and did not have any questions or concerns at this time.  ? ?

## 2023-10-10 NOTE — Progress Notes (Signed)
Advanced Hypertension Clinic Assessment:    Date:  10/10/2023   ID:  [name], DOB 1951-03-04, MRN [id number]  PCP:  Jeani Sow, MD  Cardiologist:  Lance Muss, MD  Nephrologist:  Referring MD: Jeani Sow, MD   CC: Hypertension  History of Present Illness:    [name] is a 72 y.o. female with a hx of nonobstructive CAD, carotid stenosis, diabetes, CKD, hypertension, hyperlipidemia, dementia. Here to follow up in the Advanced Hypertension Clinic.   Saw Dr. Eldridge Dace 2021 due to three-vessel coronary disease on imaging.  Lower extremity edema managed on Bumex.  Echocardiogram 02/2023 normal systolic and diastolic function.  Carotid Doppler 07/2023 60 to 79% right ICA stenosis and 1-39% left ICA stenosis.  She saw vascular surgery 07/2023 and as she was asymptomatic medical management was recommended.  She remained off aspirin due to history of GI bleeding.  Established with [facility]pert[facility] Clinic 08/14/23 for difficult to control blood pressure with multiple medication intolerances.  Home BP mostly 120-160s/70-80s with rare SBP 220.  She noted 87-month history of shortness of breath.  Also noted more difficult sleep and snoring, home sleep study offered which she preferred to think about. Amlodipine 2.5mg  daily initiated.    Presents today for follow up. She reports numerous allergies to medications. She notes this stems hospitalization 4/2/024 for jaundice due to liver injury after recent Augmentin use. Liver function has since normalized. She self endorses being very skeptical about medications. She is no longer taking Amlodipine, self discontinued as was unaware it was an antihypertensive agent. No reported side effects. She notes taking her Torsemide daily recently with improvement in LE edema but has concerns as reports she "developed an allergy" to Torsemide and lists a myriad of symptoms including chills, stomach upset, rash which are intermittent. Of  note, chills have been ongoing many years - low suspicion related to her medications. Checking BP at home intermittently with readings from September reviewed today: 138/80, 137/77, 103/60, 127/68, 122/71, 119/68, 128/71, 119/66. Average BP on those readings: 124/70.   Of note, has appointment 10/14/23 to establish with Dr. Elease Hashimoto for annual general cardiology follow up and to discuss possible colonoscopy/endoscopy. Per GI notes she is overdue for colonoscopy for history of adenomatous colon polyp and has been recommended to consider endoscopy to evaluate anemia. She has appt 11/01/23 with Dr. Lavon Paganini of GI to discuss further, she prefers to be seen by MD instead of APP.   Previous antihypertensives: Atenolol-GI issues, chills Losartan Spironolactone/HCTZ Eplerenone-increased BP  Past Medical History:  Diagnosis Date   Allergy    Anemia    Arthritis    Asthma    Dianosed years ago-no meds at this time   Cancer [facility]) 2021   endometrial    Cataract    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 name of MD    Dementia (HCC)    Diabetes mellitus    type 2    Diverticulosis    Dyslipidemia    Dyspnea    with exertion    Dysrhythmia    "skips beats"   Endometrial cancer (HCC) 2021   Family history of adverse reaction to anesthesia    son stopped breathing during surgery , mother slow to wake up    Fibromyalgia 1980's   Generalized anxiety disorder    GERD (  gastroesophageal reflux disease)    occ- will use Tums and Prolosec  prn    Heart murmur    Hyperlipemia    Hypertension    not on blood pressure meds due to allergies - last dose of meds- months ago    Hypothyroidism    Hypothyroidism (acquired)    Internal hemorrhoids    Irritable bowel syndrome 1980's   Liver cancer (HCC)    Major depressive disorder    Migraine headaches 1980's   On  meds-well controlled   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   Morbid obesity (HCC)    Pernicious anemia    Pneumonia 2009   Several times over the past several years   PONV (postoperative nausea and vomiting)    was told by neurologist due to calcificaton in brain not to be put to sleep, N/V during Op and Recovery    Recurrent upper respiratory infection (URI)    Sleep apnea    no cpap   Subarachnoid hemorrhage (HCC) 2010   Vaginal polyp    benign per pt    Past Surgical History:  Procedure Laterality Date   CATARACT EXTRACTION Right    CHOLECYSTECTOMY  1983   COLONOSCOPY  2008   DB   DILATION AND CURETTAGE OF UTERUS  1972   HYSTEROSCOPY WITH D & C  06/29/2011   Procedure: DILATATION AND CURETTAGE (D&C) /HYSTEROSCOPY;  Surgeon: Reva Bores, MD;  Location: [facility];  Service: Gynecology;  Laterality: N/A;   ROBOTIC ASSISTED TOTAL HYSTERECTOMY WITH BILATERAL SALPINGO OOPHERECTOMY N/A 10/27/2020   Procedure: XI ROBOTIC ASSISTED TOTAL HYSTERECTOMY WITH BILATERAL SALPINGO OOPHORECTOMY;  Surgeon: Adolphus Birchwood, MD;  Location: [facility];  Service: Gynecology;  Laterality: N/A;   SENTINEL NODE BIOPSY N/A 10/27/2020   Procedure: SENTINEL NODE BIOPSY;  Surgeon: Adolphus Birchwood, MD;  Location: [facility];  Service: Gynecology;  Laterality: N/A;    Current Medications: Current Meds  Medication Sig   Cholecalciferol (VITAMIN D3) 25 MCG (1000 UT) capsule Take 1,000 Units by mouth daily.   cyanocobalamin (,VITAMIN B-12,) 1000 MCG/ML injection Inject 1,000 mcg into the muscle every 30 (thirty) days.    estradiol (ESTRING) 7.5 MCG/24HR vaginal ring Place 1 each vaginally every 3 (three) months.   glucose blood (ACCU-CHEK GUIDE) test strip Use to test blood sugar 3 times daily (Patient taking differently: 1 each as needed. Use to test blood sugar 3 times daily)   levothyroxine (SYNTHROID) 50 MCG tablet Take 1 tablet (50 mcg total) by mouth daily before breakfast.   MAGNESIUM CITRATE PO Take 1 tablet by  mouth 2 (two) times daily.   Multiple Vitamin (MULTIVITAMIN WITH MINERALS) TABS tablet Take 1 tablet by mouth daily.   Na Sulfate-K Sulfate-Mg Sulf 17.5-3.13-1.6 GM/177ML SOLN    pantoprazole (PROTONIX) 40 MG tablet Take 40 mg by mouth as needed.   potassium chloride SA (KLOR-CON M) 20 MEQ tablet Take 1 tablet (20 mEq total) by mouth daily.   pregabalin (LYRICA) 50 MG capsule Take 50 mg by mouth as needed ((for pain).).   REPATHA SURECLICK 140 MG/ML SOAJ INJECT 1 PEN SUBCUTANEOUSLY EVERY 14 DAYS   SYSTANE HYDRATION PF 0.4-0.3 % SOLN Place 1-2 drops into both eyes in the morning.   torsemide (DEMADEX) 20 MG tablet Take 1 tablet (20 mg total) by mouth daily.   Current Facility-Administered Medications for the 10/10/23 encounter (Office Visit) with Alver Sorrow, NP  Medication   cyanocobalamin (VITAMIN B12) injection 1,000 mcg     Allergies:  Atenolol, Atorvastatin, Canagliflozin, Losartan potassium, Acarbose, Cinnamon, Empagliflozin, Glimepiride, Metformin and related, Other, Pioglitazone, Pravastatin, Sitagliptin, Spironolactone-hctz, Statins, Sulfamethoxazole, Tramadol hcl, Atorvastatin calcium, Augmentin [amoxicillin-pot clavulanate], Crestor [rosuvastatin], Rosuvastatin calcium, Torsemide, Bumex [bumetanide], Lasix [furosemide], and Sulfa antibiotics   Social History   Socioeconomic History   Marital status: Legally Separated    Spouse name: Not on file   Number of children: 3   Years of education: 12   Highest education level: Not on file  Occupational History   Occupation: Retired     Comment: nanny   Occupation: retired  Tobacco Use   Smoking status: Never   Smokeless tobacco: Never  Vaping Use   Vaping status: Never Used  Substance and Sexual Activity   Alcohol use: Not Currently    Comment: quit 1978   Drug use: Never   Sexual activity: Not Currently  Other Topics Concern   Not on file  Social History Narrative    Lives at home. Her granddaughter lives with her  and daughter    Right handed   Social Determinants of Health   Financial Resource Strain: Not on file  Food Insecurity: Not on file  Transportation Needs: Not on file  Physical Activity: Not on file  Stress: Not on file  Social Connections: Not on file     Family History: The patient's family history includes Anesthesia problems in her granddaughter, mother, and son; Arthritis in an other family member; Bladder Cancer in her father; Cerebrovascular Disease in her mother; Colon cancer in her father; Diabetes in an other family member; High blood pressure in an other family member; Liver cancer in her father; Schizophrenia in her child and daughter; Tremor in her child and son. There is no history of Dementia, Esophageal cancer, Rectal cancer, Stomach cancer, or Colon polyps.  ROS:   Please see the history of present illness.     All other systems reviewed and are negative.  EKGs/Labs/Other Studies Reviewed:    EKG Interpretation Date/Time:  Thursday October 10 2023 15:14:12 EDT Ventricular Rate:  85 PR Interval:  136 QRS Duration:  70 QT Interval:  382 QTC Calculation: 454 R Axis:   -10  Text Interpretation: Sinus rhythm  with isolated PVC Low voltage QRS Confirmed by Gillian Shields ([id number]) on 10/10/2023 3:17:29 PM    Recent Labs: 12/19/2022: Pro B Natriuretic peptide (BNP) 92.0 07/02/2023: TSH 1.32 09/18/2023: ALT 8; Hemoglobin 11.6; Platelets 210.0 10/07/2023: BUN 22; Creatinine, Ser 1.39; Magnesium 1.7; Potassium 4.0; Sodium 140   Recent Lipid Panel    Component Value Date/Time   CHOL 118 07/02/2023 1447   CHOL 134 08/16/2021 1018   TRIG 143.0 07/02/2023 1447   HDL 45.60 07/02/2023 1447   HDL 36 (L) 08/16/2021 1018   CHOLHDL 3 07/02/2023 1447   VLDL 28.6 07/02/2023 1447   LDLCALC 44 07/02/2023 1447   LDLCALC 69 08/16/2021 1018   LDLDIRECT 130 (H) 05/10/2020 1659    Physical Exam:   VS:  BP 130/74   Pulse 85   Ht 5\' 2"  (1.575 m)   Wt 188 lb 14.4 oz (85.7 kg)    SpO2 100%   BMI 34.55 kg/m  , BMI Body mass index is 34.55 kg/m.  Vitals:   10/10/23 1508 10/10/23 1534  BP: (!) 147/85 130/74  Pulse: 85   Height: 5\' 2"  (1.575 m)   Weight: 188 lb 14.4 oz (85.7 kg)   SpO2: 100%   BMI (Calculated): 34.54     GENERAL:  Well appearing HEENT: Pupils  equal round and reactive, fundi not visualized, oral mucosa unremarkable NECK:  No jugular venous distention, waveform within normal limits, carotid upstroke brisk and symmetric, no bruits, no thyromegaly LYMPHATICS:  No cervical adenopathy LUNGS:  Clear to auscultation bilaterally HEART:  RRR.  PMI not displaced or sustained,S1 and S2 within normal limits, no S3, no S4, no clicks, no rubs, no murmurs ABD:  Flat, positive bowel sounds normal in frequency in pitch, no bruits, no rebound, no guarding, no midline pulsatile mass, no hepatomegaly, no splenomegaly EXT:  2 plus pulses throughout, no edema, no cyanosis no clubbing SKIN:  No rashes no nodules NEURO:  Cranial nerves II through XII grossly intact, motor grossly intact throughout PSYCH:  Cognitively intact, oriented to person place and time   ASSESSMENT/PLAN:    HTN - Prior intolerances detailed above. Very hesitant regarding medications. Self discontinued Amlodipine as she was unaware it was antihypertensive agent. Average BP in September at home 124/70 by arm cuff. Initial BP 147/85 with repeat 130/74 without intervention. Not presently on antihypertensive agent though has been taking PRN Torsemide more recent, as below. Given BP reasonably controlled in clinic and controlled at home, will defer resuming Amlodipine. Extensive education provided in clinic and on AVS regarding the safety of Amlodipine in case needed in the future. Requested she check BP 3x per week and contact us if >130/80 at home.  She is reconsidering participation in PREP exercise program in November.   CAD / HLD, LDL goal <70 / Statin myopathy -mild CAD by coronary CTA 04/2020.   Continue Repatha.  Intolerant to statin with myalgia.  Possible OSA- Noted snoring, sleep disturbance. Was offered referral to sleep study at consult 08/2023 and politely declined.   LE edema - Echo 02/2023 normal systolic and diastolic function. Multiple medication intolerances. On Torsemide 20mg  PRN per PCP. Notes she has been using more recently, renal functio in 10/07/23 was stable.  DM2 with CKD3a - Continue to follow with PCP.   Screening for Secondary Hypertension:     08/14/2023    3:01 PM  Causes  Drugs/Herbals Screened     - Comments +frozen meals.  +1-3 cups of coffee.  2 sodas/day.  No EtOH/tob  Renovascular HTN N/A  Thyroid Disease Screened  Hyperaldosteronism N/A  Pheochromocytoma N/A  Cushing's Syndrome N/A  Hyperparathyroidism N/A  Coarctation of the Aorta Screened     - Comments BP symmetric  Compliance Screened    Relevant Labs/Studies:    Latest Ref Rng & Units 10/07/2023    2:45 PM 08/08/2023    3:21 PM 07/17/2023   12:01 PM  Basic Labs  Sodium 135 - 145 mEq/L 140  142  139   Potassium 3.5 - 5.1 mEq/L 4.0  4.3  4.0   Creatinine 0.40 - 1.20 mg/dL 0.98  1.19  1.47        Latest Ref Rng & Units 07/02/2023    2:47 PM 12/07/2022   10:38 AM  Thyroid   TSH 0.35 - 5.50 uIU/mL 1.32  1.02                  Disposition:    FU with MD/PharmD in 3-4 months   Medication Adjustments/Labs and Tests Ordered: Current medicines are reviewed at length with the patient today.  Concerns regarding medicines are outlined above.  Orders Placed This Encounter  Procedures   EKG 12-Lead   No orders of the defined types were placed in this encounter.  Signed, Alver Sorrow, NP  10/10/2023 3:43 PM     [facility]

## 2023-10-10 NOTE — Telephone Encounter (Signed)
The pt has been advised that the results have not been reviewed and we will contact her as soon as able.

## 2023-10-10 NOTE — Patient Instructions (Addendum)
Medication Instructions:  Continue your current medications.   *If you need a refill on your cardiac medications before your next appointment, please call your pharmacy*  Follow-Up: At [facility], you and your health needs are our priority.  As part of our continuing mission to provide you with exceptional heart care, we have created designated Provider Care Teams.  These Care Teams include your primary Cardiologist (physician) and Advanced Practice Providers (APPs -  Physician Assistants and Nurse Practitioners) who all work together to provide you with the care you need, when you need it.  Your next appointment:   3-4 months with Dr. Duke Salvia or Alver Sorrow, NP in Hypertension Clinic  Other Instructions   If your blood pressure is poorly controlled in the future and not at goal of less than 130/80, we would consider using a blood pressure medicine called Amlodipine (Norvasc). This is a generic medication.   It is called a calcium channel blocker. It works by helping to block stress signals and help your heart and blood vessels to relax.   Amlodipine is different than previous medications you have taken for blood pressure so we would not expect you to have a reaction. We would start it at a low dose. It does not negatively effect your kidneys nor your liver.  Amlodipine is even used in pregnant individuals so is a very safe medication.   Please check your blood pressure three times per week. If your blood pressure at home is routinely more than 130/80, we would plan to start blood pressure medication.

## 2023-10-13 ENCOUNTER — Encounter: Payer: Self-pay | Admitting: Cardiovascular Disease

## 2023-10-13 NOTE — Progress Notes (Unsigned)
Cardiology Office Note:  .   Date:  10/14/2023  ID:  [name], DOB 11-Jun-1951, MRN [id number] PCP: Jeani Sow, MD  [facility] Providers Cardiologist:  Lance Muss, MD {    History of Present Illness: . Oct 14, 2023   [name] is a 72 y.o. female , previous patient of Dr. Eldridge Dace I am meeting her for the first time today   Hx of nonobstructive CAD  Carotid stenosis CKD HTN - has been seen in the Advanced HTN clinic HLD Dementia   Has white coat HTN  BP readings are been quite variable  She still eats lots of salt  Complains of fluid retention and weight gain    Has dizziness, has fallen many times  So she does not get much exercise   Has been found to have some colon issues  Has developed anemia and will need EGD  as well      ROS:   Studies Reviewed: .         Risk Assessment/Calculations:         Physical Exam:   VS:  BP 128/68   Pulse 93   Ht 5' 2.5" (1.588 m)   Wt 191 lb (86.6 kg)   SpO2 98%   BMI 34.38 kg/m    Wt Readings from Last 3 Encounters:  10/14/23 191 lb (86.6 kg)  10/10/23 188 lb 14.4 oz (85.7 kg)  10/07/23 189 lb 4 oz (85.8 kg)    GEN: Well nourished, well developed in no acute distress NECK: No JVD; No carotid bruits CARDIAC: RRR, no murmurs, rubs, gallops RESPIRATORY:  Clear to auscultation without rales, wheezing or rhonchi  ABDOMEN: Soft, non-tender, non-distended EXTREMITIES:  No edema; No deformity   ASSESSMENT AND PLAN: .    HTN:   she still eats a very salty diet .    She is being followed in the Advanced HTN clinic  I've asked her to greatly reduce her salt intake .  She has not been exercising much   2.  Hyperlipidemia :   contniue repatha   3.  Leg edema:  no leg edema today .  Continue torsemide and Kdur   1 year follow up         Dispo:   Signed, Kristeen Miss, MD

## 2023-10-14 ENCOUNTER — Encounter: Payer: Self-pay | Admitting: Cardiovascular Disease

## 2023-10-14 ENCOUNTER — Ambulatory Visit: Payer: 59 | Attending: Cardiovascular Disease | Admitting: Cardiovascular Disease

## 2023-10-14 VITALS — BP 128/68 | HR 93 | Ht 62.5 in | Wt 191.0 lb

## 2023-10-14 DIAGNOSIS — E785 Hyperlipidemia, unspecified: Secondary | ICD-10-CM | POA: Diagnosis not present

## 2023-10-14 DIAGNOSIS — I10 Essential (primary) hypertension: Secondary | ICD-10-CM

## 2023-10-14 NOTE — Patient Instructions (Signed)
Follow-Up: At [facility], you and your health needs are our priority.  As part of our continuing mission to provide you with exceptional heart care, we have created designated Provider Care Teams.  These Care Teams include your primary Cardiologist (physician) and Advanced Practice Providers (APPs -  Physician Assistants and Nurse Practitioners) who all work together to provide you with the care you need, when you need it.  Your next appointment:   1 year(s)  Provider:   Verne Carrow, MD

## 2023-10-22 ENCOUNTER — Telehealth: Payer: Self-pay

## 2023-10-22 NOTE — Telephone Encounter (Signed)
VMT pt reference starting PREP on 11/05/23.

## 2023-10-28 ENCOUNTER — Telehealth: Payer: Self-pay

## 2023-10-28 NOTE — Telephone Encounter (Signed)
Call from pt. Appreciated the call reference starting PREP NOV 19th however still undergoing testing and eye procedures. Open to being called in January and offered another class.

## 2023-10-30 ENCOUNTER — Encounter: Payer: Self-pay | Admitting: Podiatry

## 2023-10-30 ENCOUNTER — Ambulatory Visit: Payer: 59 | Admitting: Podiatry

## 2023-10-30 DIAGNOSIS — M79674 Pain in right toe(s): Secondary | ICD-10-CM

## 2023-10-30 DIAGNOSIS — N1831 Chronic kidney disease, stage 3a: Secondary | ICD-10-CM

## 2023-10-30 DIAGNOSIS — B351 Tinea unguium: Secondary | ICD-10-CM | POA: Diagnosis not present

## 2023-10-30 DIAGNOSIS — M79675 Pain in left toe(s): Secondary | ICD-10-CM | POA: Diagnosis not present

## 2023-10-30 DIAGNOSIS — E1122 Type 2 diabetes mellitus with diabetic chronic kidney disease: Secondary | ICD-10-CM | POA: Diagnosis not present

## 2023-10-30 NOTE — Progress Notes (Signed)
Subjective:  Patient ID: [name], female    DOB: [birth year],  MRN: [id number]  72 y.o. female presents at risk foot care. Pt has h/o NIDDM with chronic kidney disease and painful thick toenails that are difficult to trim. Pain interferes with ambulation. Aggravating factors include wearing enclosed shoe gear. Pain is relieved with periodic professional debridement.  Chief Complaint  Patient presents with   [facility]    [facility] BS not checked this am A1c 6.8 10/07/2023 PCP appt 10/2023   New problem(s): None   PCP is Jeani Sow, MD , and last visit was October 07, 2023.  Allergies  Allergen Reactions   Atenolol Other (See Comments)    Stomach problems, chills   Atorvastatin Other (See Comments)    Retained fluid   Canagliflozin Other (See Comments)    ALL DIABETIC MEDS PER PT   abdominal pain, headache   Losartan Potassium Other (See Comments)    chills, HA, stomach pain   Acarbose Nausea Only and Other (See Comments)     headache,nausea   Cinnamon Other (See Comments)    Stomach pain   Empagliflozin Other (See Comments)     headaches   Glimepiride Other (See Comments)    ALL DIABETIC MEDS PER PT  abdominal pain, headache   Metformin And Related Nausea And Vomiting and Other (See Comments)    Patient states that she has severe chills, headache and cramping additionally. Says blood sugar is uncontrolled because of this   Other Other (See Comments)    ALL DIABETIC MEDS PER PT    Pioglitazone Other (See Comments)    ALL DIABETIC MEDS PER PT = peripheral edema   Pravastatin Other (See Comments)    Retained fluid   Sitagliptin Other (See Comments)    ALL DIABETIC MEDS PER PT  Other reaction(s): headache   Spironolactone-Hctz Other (See Comments)   Statins Other (See Comments)    Myalgia   Sulfamethoxazole Other (See Comments)    ALL DIABETIC MEDS PER PT    Tramadol Hcl Itching   Atorvastatin Calcium Other (See Comments)     retain fld   Augmentin  [Amoxicillin-Pot Clavulanate] Other (See Comments)    Caused jaundice   Crestor [Rosuvastatin] Other (See Comments)   Rosuvastatin Calcium    Torsemide Nausea Only    Gi upset, chills, abd pain,bloating   Bumex [Bumetanide] Rash and Other (See Comments)    Chills, rash, headaches, stomach issues also   Lasix [Furosemide] Rash and Other (See Comments)    Sugars increased   Sulfa Antibiotics Rash and Other (See Comments)    Mother has told patient in the past not to take, she cannot recall there reaction    Review of Systems: Negative except as noted in the HPI.   Objective:  [name] is a pleasant 72 y.o. female WD, WN in NAD.Marland Kitchen AAO x 3.  Vascular Examination: Vascular status intact b/l with palpable pedal pulses. CFT immediate b/l. Pedal hair present. No edema. No pain with calf compression b/l. Skin temperature gradient WNL b/l. No varicosities noted. No cyanosis or clubbing noted.  Neurological Examination: Sensation grossly intact b/l with 10 gram monofilament. Vibratory sensation intact b/l.  Dermatological Examination: Pedal skin with normal turgor, texture and tone b/l. No open wounds nor interdigital macerations noted. Toenails 1-5 b/l thick, discolored, elongated with subungual debris and pain on dorsal palpation. No hyperkeratotic lesions noted b/l.   Musculoskeletal Examination: Muscle strength 5/5 to b/l LE.  No pain,  crepitus noted b/l. HAV with bunion deformity noted b/l LE.  Radiographs: None  Last A1c:      Latest Ref Rng & Units 10/07/2023    2:45 PM 07/02/2023    2:47 PM 03/05/2023    1:44 PM  Hemoglobin A1C  Hemoglobin-A1c 4.6 - 6.5 % 6.8  7.7 Repeated and verified X2.  7.2      Assessment:   1. Pain due to onychomycosis of toenails of both feet   2. Type 2 diabetes mellitus with stage 3a chronic kidney disease, without long-term current use of insulin (HCC)    Plan:  Patient was evaluated and treated. All patient's and/or POA's  questions/concerns addressed on today's visit. Toenails 1-5 debrided in length and girth without incident. Continue soft, supportive shoe gear daily. Report any pedal injuries to medical professional. Call office if there are any questions/concerns. -Continue foot and shoe inspections daily. Monitor blood glucose per PCP/Endocrinologist's recommendations. -Patient/POA to call should there be question/concern in the interim.  Return in about 9 weeks (around 01/01/2024).  Freddie Breech, DPM

## 2023-11-01 ENCOUNTER — Ambulatory Visit: Payer: 59 | Admitting: Gastroenterology

## 2023-11-01 ENCOUNTER — Encounter: Payer: Self-pay | Admitting: Gastroenterology

## 2023-11-01 VITALS — BP 124/70 | HR 68 | Ht 62.0 in | Wt 189.0 lb

## 2023-11-01 DIAGNOSIS — R159 Full incontinence of feces: Secondary | ICD-10-CM | POA: Diagnosis not present

## 2023-11-01 DIAGNOSIS — R1312 Dysphagia, oropharyngeal phase: Secondary | ICD-10-CM

## 2023-11-01 DIAGNOSIS — Z860101 Personal history of adenomatous and serrated colon polyps: Secondary | ICD-10-CM | POA: Diagnosis not present

## 2023-11-01 DIAGNOSIS — R1310 Dysphagia, unspecified: Secondary | ICD-10-CM | POA: Diagnosis not present

## 2023-11-01 DIAGNOSIS — K582 Mixed irritable bowel syndrome: Secondary | ICD-10-CM

## 2023-11-01 DIAGNOSIS — Z860100 Personal history of colon polyps, unspecified: Secondary | ICD-10-CM

## 2023-11-01 DIAGNOSIS — D649 Anemia, unspecified: Secondary | ICD-10-CM | POA: Diagnosis not present

## 2023-11-01 DIAGNOSIS — D509 Iron deficiency anemia, unspecified: Secondary | ICD-10-CM

## 2023-11-01 DIAGNOSIS — K580 Irritable bowel syndrome with diarrhea: Secondary | ICD-10-CM

## 2023-11-01 DIAGNOSIS — D638 Anemia in other chronic diseases classified elsewhere: Secondary | ICD-10-CM

## 2023-11-01 DIAGNOSIS — M6289 Other specified disorders of muscle: Secondary | ICD-10-CM

## 2023-11-01 MED ORDER — NA SULFATE-K SULFATE-MG SULF 17.5-3.13-1.6 GM/177ML PO SOLN: 1.0000 | Freq: Once | ORAL | 0 refills | Status: AC

## 2023-11-01 NOTE — Patient Instructions (Addendum)
VISIT SUMMARY:  During today's visit, we discussed your concerns about scheduling and the necessity of upcoming colonoscopy and endoscopy procedures, as well as your symptoms of severe diarrhea, difficulty swallowing liquids, and other health issues. We reviewed your history of precancerous polyps, anemia, and recent health challenges. We also addressed your concerns about anesthesia due to family history and your current medications.  YOUR PLAN:  -COLON POLYPS: Colon polyps are growths on the inner lining of the colon which can sometimes develop into cancer. Given your history and age, we discussed the risks and benefits of a colonoscopy. We plan to proceed with a colonoscopy at [facility] using a tablet preparation due to your difficulty with liquid intake.  -SWALLOWING DIFFICULTY: Difficulty swallowing liquids can be due to issues with the muscles in your throat. We have ordered a swallow test to evaluate the function of these muscles. EGD to be scheduled along with colonoscopy  -DIARRHEA: Diarrhea involves frequent, loose, or watery bowel movements. To help manage this, we recommend adding Benefiber to your meals to bulk up your stool and using a step stool during bowel movements to help with complete evacuation.  -ANEMIA: Anemia is a condition where you don't have enough healthy red blood cells to carry adequate oxygen to your body's tissues. We will continue to monitor your blood counts to manage this condition.  -GENERAL HEALTH MAINTENANCE: For your overall health, please resume taking a multivitamin like One A Day, and continue with your B12 injections as prescribed.   I appreciate the  opportunity to care for you  Thank You   Marsa Aris , MD  INSTRUCTIONS:  Please schedule and complete the swallow test, endoscopy, and colonoscopy. We will see you back after these tests to review the results and adjust your treatment plan as needed. Add 1 tablespoon Benefiber three  times daily with meals Daily Multivitamin One A day 50+ for women  Due to recent changes in healthcare laws, you may see the results of your imaging and laboratory studies on MyChart before your provider has had a chance to review them.  We understand that in some cases there may be results that are confusing or concerning to you. Not all laboratory results come back in the same time frame and the provider may be waiting for multiple results in order to interpret others.  Please give [name] 48 hours in order for your provider to thoroughly review all the results before contacting the office for clarification of your results.    You have been scheduled for a modified barium swallow on ___________ at ______________. Please arrive 30 minutes prior to your test for registration. You will go to ____________ Radiology (1st Floor) for your appointment. Should you need to cancel or reschedule your appointment, please contact [phone] [facility]) or [phone] [facility]). _____________________________________________________________________ A Modified Barium Swallow Study, or MBS, is a special x-ray that is taken to check swallowing skills. It is carried out by a Marine scientist and a Warehouse manager (SLP). During this test, yourmouth, throat, and esophagus, a muscular tube which connects your mouth to your stomach, is checked. The test will help you, your doctor, and the SLP plan what types of foods and liquids are easier for you to swallow. The SLP will also identify positions and ways to help you swallow more easily and safely. What will happen during an MBS? You will be taken to an x-ray room and seated comfortably. You will be asked to swallow small amounts of food  and liquid mixed with barium. Barium is a liquid or paste that allows images of your mouth, throat and esophagus to be seen on x-ray. The x-ray captures moving images of the food you are swallowing as it travels from your  mouth through your throat and into your esophagus. This test helps identify whether food or liquid is entering your lungs (aspiration). The test also shows which part of your mouth or throat lacks strength or coordination to move the food or liquid in the right direction. This test typically takes 30 minutes to 1 hour to complete. _______________________________________________________________________

## 2023-11-01 NOTE — Progress Notes (Signed)
[name]    [id number]    24-Jan-1951  Primary Care Physician:Kulik, Maryruth Hancock, MD  Referring Physician: Jeani Sow, MD [address] [place],  [place] [address]   Chief complaint: History of colon polyps  Discussed the use of AI scribe software for clinical note transcription with the patient, who gave verbal consent to proceed.  History of Present Illness   The patient, an elderly individual with a history of precancerous polyps removed five and a half years ago, presents with concerns about scheduling and the necessity of upcoming colonoscopy and endoscopy procedures. The patient expresses fear about the procedures due to a family history of anesthesia complications. She also reports a feeling of rawness internally, though it is unclear whether this is a physical sensation or a metaphorical description of her overall health status.  The patient has been experiencing severe diarrhea, with up to five episodes per day, often resulting in incontinence. The stool is described as slushy, with occasional formed stool. The patient reports that the diarrhea is so severe that she has resorted to not eating to avoid episodes. Despite this, she reports rapid weight gain.  The patient also reports difficulty swallowing liquids, which often results in the liquid coming back up. She does not report the same difficulty with swallowing solid foods. She also mentions an increase in drooling.  The patient has a history of anemia, which was last reported three months ago. She also reports a feeling of rawness internally, but it is unclear whether this is a physical sensation or a metaphorical description of her overall health status. The patient has been experiencing a series of health issues over the past year and a half, including a broken back, which she describes as one thing right after another.  The patient also reports a collapsed vagina, for which a ring was inserted.  She reports increased pain in the area since the procedure. She also reports a cough, wheezing, and some trouble breathing at night. The patient has been taking torsemide, despite developing an allergy to it. She also reports that her uric acid levels were elevated.          Outpatient Encounter Medications as of 11/01/2023  Medication Sig   Cholecalciferol (VITAMIN D3) 25 MCG (1000 UT) capsule Take 1,000 Units by mouth daily.   cyanocobalamin (,VITAMIN B-12,) 1000 MCG/ML injection Inject 1,000 mcg into the muscle every 30 (thirty) days.    estradiol (ESTRING) 7.5 MCG/24HR vaginal ring Place 1 each vaginally every 3 (three) months.   glucose blood (ACCU-CHEK GUIDE) test strip Use to test blood sugar 3 times daily (Patient taking differently: 1 each as needed. Use to test blood sugar 3 times daily)   levothyroxine (SYNTHROID) 50 MCG tablet Take 1 tablet (50 mcg total) by mouth daily before breakfast.   MAGNESIUM CITRATE PO Take 1 tablet by mouth 2 (two) times daily.   Multiple Vitamin (MULTIVITAMIN WITH MINERALS) TABS tablet Take 1 tablet by mouth daily.   [EXPIRED] Na Sulfate-K Sulfate-Mg Sulf 17.5-3.13-1.6 GM/177ML SOLN Take 1 kit by mouth once for 1 dose.   pantoprazole (PROTONIX) 40 MG tablet Take 40 mg by mouth as needed.   potassium chloride SA (KLOR-CON M) 20 MEQ tablet Take 1 tablet (20 mEq total) by mouth daily.   pregabalin (LYRICA) 50 MG capsule Take 50 mg by mouth as needed ((for pain).).   REPATHA SURECLICK 140 MG/ML SOAJ INJECT 1 PEN SUBCUTANEOUSLY EVERY  14 DAYS   SYSTANE HYDRATION PF 0.4-0.3 % SOLN Place 1-2 drops into both eyes in the morning.   torsemide (DEMADEX) 20 MG tablet Take 1 tablet (20 mg total) by mouth daily.   [DISCONTINUED] Na Sulfate-K Sulfate-Mg Sulf 17.5-3.13-1.6 GM/177ML SOLN    Facility-Administered Encounter Medications as of 11/01/2023  Medication   cyanocobalamin (VITAMIN B12) injection 1,000 mcg    Allergies as of 11/01/2023 - Review Complete  11/01/2023  Allergen Reaction Noted   Atenolol Other (See Comments) 09/29/2019   Atorvastatin Other (See Comments) 05/20/2018   Canagliflozin Other (See Comments) 05/20/2018   Losartan potassium Other (See Comments) 05/20/2018   Acarbose Nausea Only and Other (See Comments) 05/20/2018   Cinnamon Other (See Comments) 06/15/2015   Empagliflozin Other (See Comments) 11/28/2021   Glimepiride Other (See Comments) 05/20/2018   Metformin and related Nausea And Vomiting and Other (See Comments) 06/26/2011   Other Other (See Comments) 04/15/2020   Pioglitazone Other (See Comments) 04/15/2020   Pravastatin Other (See Comments) 05/20/2018   Sitagliptin Other (See Comments) 05/20/2018   Spironolactone-hctz Other (See Comments) 11/28/2021   Statins Other (See Comments) 10/14/2020   Sulfamethoxazole Other (See Comments) 03/24/2007   Tramadol hcl Itching 10/29/2020   Atorvastatin calcium Other (See Comments) 11/28/2021   Augmentin [amoxicillin-pot clavulanate] Other (See Comments) 03/27/2023   Crestor [rosuvastatin] Other (See Comments) 11/28/2021   Rosuvastatin calcium  03/14/2022   Torsemide Nausea Only 07/02/2023   Bumex [bumetanide] Rash and Other (See Comments) 05/04/2022   Lasix [furosemide] Rash and Other (See Comments) 07/24/2022   Sulfa antibiotics Rash and Other (See Comments) 06/16/2011    Past Medical History:  Diagnosis Date   Allergy    Anemia    Arthritis    Asthma    Dianosed years ago-no meds at this time   Cancer [facility]) 2021   endometrial    Cataract    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 name of MD    Dementia (HCC)    Diabetes mellitus    type 2    Diverticulosis    Dyslipidemia    Dyspnea    with exertion    Dysrhythmia    "skips beats"   Endometrial cancer (HCC) 2021   Family history of adverse reaction to anesthesia    son  stopped breathing during surgery , mother slow to wake up    Fibromyalgia 1980's   Generalized anxiety disorder    GERD (gastroesophageal reflux disease)    occ- will use Tums and Prolosec  prn    Heart murmur    Hyperlipemia    Hypertension    not on blood pressure meds due to allergies - last dose of meds- months ago    Hypothyroidism    Hypothyroidism (acquired)    Internal hemorrhoids    Irritable bowel syndrome 1980's   Liver cancer (HCC)    Major depressive disorder    Migraine headaches 1980's   On meds-well controlled   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   Morbid obesity (HCC)    Pernicious anemia    Pneumonia 2009   Several times over the past several years   PONV (postoperative nausea and vomiting)    was told by neurologist due to calcificaton in brain not to be put to sleep, N/V during Op and Recovery    Recurrent upper respiratory infection (  URI)    Sleep apnea    no cpap   Subarachnoid hemorrhage (HCC) 2010   Vaginal polyp    benign per pt    Past Surgical History:  Procedure Laterality Date   CATARACT EXTRACTION Right    CHOLECYSTECTOMY  1983   COLONOSCOPY  2008   DB   DILATION AND CURETTAGE OF UTERUS  1972   HYSTEROSCOPY WITH D & C  06/29/2011   Procedure: DILATATION AND CURETTAGE (D&C) /HYSTEROSCOPY;  Surgeon: Reva Bores, MD;  Location: [facility];  Service: Gynecology;  Laterality: N/A;   ROBOTIC ASSISTED TOTAL HYSTERECTOMY WITH BILATERAL SALPINGO OOPHERECTOMY N/A 10/27/2020   Procedure: XI ROBOTIC ASSISTED TOTAL HYSTERECTOMY WITH BILATERAL SALPINGO OOPHORECTOMY;  Surgeon: Adolphus Birchwood, MD;  Location: [facility];  Service: Gynecology;  Laterality: N/A;   SENTINEL NODE BIOPSY N/A 10/27/2020   Procedure: SENTINEL NODE BIOPSY;  Surgeon: Adolphus Birchwood, MD;  Location: [facility];  Service: Gynecology;  Laterality: N/A;    Family History  Problem Relation Age of Onset   Anesthesia problems Mother        hard to wake post op    Cerebrovascular Disease Mother         Never had stroke, but had CEA.   Liver cancer Father        mets to liver    Bladder Cancer Father    Colon cancer Father        mets to colon    Schizophrenia Daughter        Schizoaffective disorder   Anesthesia problems Son        stopped breathing post op    Tremor Son    Anesthesia problems Granddaughter        PONV   High blood pressure Other    Diabetes Other    Arthritis Other    Schizophrenia Child    Tremor Child    Dementia Neg Hx    Esophageal cancer Neg Hx    Rectal cancer Neg Hx    Stomach cancer Neg Hx    Colon polyps Neg Hx     Social History   Socioeconomic History   Marital status: Legally Separated    Spouse name: Not on file   Number of children: 3   Years of education: 12   Highest education level: Not on file  Occupational History   Occupation: Retired     Comment: nanny   Occupation: retired  Tobacco Use   Smoking status: Never   Smokeless tobacco: Never  Vaping Use   Vaping status: Never Used  Substance and Sexual Activity   Alcohol use: Not Currently    Comment: quit 1978   Drug use: Never   Sexual activity: Not Currently  Other Topics Concern   Not on file  Social History Narrative    Lives at home. Her granddaughter lives with her and daughter    Right handed   Social Determinants of Health   Financial Resource Strain: Not on file  Food Insecurity: Not on file  Transportation Needs: Not on file  Physical Activity: Not on file  Stress: Not on file  Social Connections: Not on file  Intimate Partner Violence: Not on file      Review of systems: All other review of systems negative except as mentioned in the HPI.   Physical Exam: Vitals:   11/01/23 1319  BP: 124/70  Pulse: 68   Body mass index is 34.57 kg/m. Gen:  No acute distress HEENT:  sclera anicteric Abd:      soft, non-tender; no palpable masses, no distension Ext:    No edema Neuro: alert and oriented x 3 Psych: normal mood and affect  Data  Reviewed:  Reviewed labs, radiology imaging, old records and pertinent past GI work up  Results   LABS Stool test for blood: Negative (07/2023) Blood counts: Slightly anemic, WBC low (07/2023)  DIAGNOSTIC Colonoscopy: Tiny polyp removed (04/2018)       Assessment and Plan/Recommendations: 72 year old very pleasant female with history of stage I endometrial cancer s/p TAH/BSO with complaints of generalized abdominal discomfort with cramping and irritable bowel syndrome with alternating constipation and diarrhea     Colon Polyps History of small precancerous polyps removed 5.5 years ago. Discussed the risk/benefit of colonoscopy given the patient's age and low risk for colon cancer.  Patient remains concerned about possible risk of cancer and wants to proceed with colonoscopy. -Plan for colonoscopy at [facility] with tablet prep due to patient's difficulty with liquid intake.  Swallowing Difficulty Patient reports difficulty swallowing liquids and drooling. -Order a swallow test to evaluate oropharyngeal muscle function.  Diarrhea Patient reports frequent, unformed bowel movements and fecal incontinence. -Add Benefiber to meals to bulk up stool and decrease leakage. -Advise patient to use a step stool during bowel movements to facilitate complete evacuation.  Anemia Patient has a history of anemia. -Continue monitoring blood counts.  General Health Maintenance -Advise patient to resume taking a multivitamin, such as One A Day. -Continue B12 injections and torsemide as prescribed.  Follow-up -See patient back after swallow test, endoscopy, and colonoscopy.          This visit required >40 minutes of patient care (this includes precharting, chart review, review of results, face-to-face time used for counseling as well as treatment plan and follow-up. The patient was provided an opportunity to ask questions and all were answered. The patient agreed with the plan and  demonstrated an understanding of the instructions.  Iona Beard , MD    CC: Jeani Sow, MD

## 2023-11-03 ENCOUNTER — Encounter: Payer: Self-pay | Admitting: Podiatry

## 2023-11-04 ENCOUNTER — Other Ambulatory Visit: Payer: Self-pay

## 2023-11-04 ENCOUNTER — Ambulatory Visit (INDEPENDENT_AMBULATORY_CARE_PROVIDER_SITE_OTHER): Payer: 59 | Admitting: Family Medicine

## 2023-11-04 ENCOUNTER — Encounter: Payer: Self-pay | Admitting: Family Medicine

## 2023-11-04 ENCOUNTER — Encounter (HOSPITAL_COMMUNITY): Payer: Self-pay | Admitting: Emergency Medicine

## 2023-11-04 ENCOUNTER — Emergency Department (HOSPITAL_COMMUNITY): Payer: 59

## 2023-11-04 ENCOUNTER — Other Ambulatory Visit: Payer: Self-pay | Admitting: *Deleted

## 2023-11-04 ENCOUNTER — Emergency Department (HOSPITAL_COMMUNITY)
Admission: EM | Admit: 2023-11-04 | Discharge: 2023-11-05 | Disposition: A | Discharge status: Home / Self Care | Payer: 59 | Attending: Emergency Medicine | Admitting: Emergency Medicine

## 2023-11-04 VITALS — BP 153/89 | HR 86 | Temp 97.9°F | Resp 18 | Ht 62.0 in | Wt 191.1 lb

## 2023-11-04 DIAGNOSIS — H538 Other visual disturbances: Secondary | ICD-10-CM | POA: Diagnosis not present

## 2023-11-04 DIAGNOSIS — Z79890 Hormone replacement therapy (postmenopausal): Secondary | ICD-10-CM | POA: Insufficient documentation

## 2023-11-04 DIAGNOSIS — I672 Cerebral atherosclerosis: Secondary | ICD-10-CM | POA: Diagnosis not present

## 2023-11-04 DIAGNOSIS — E039 Hypothyroidism, unspecified: Secondary | ICD-10-CM | POA: Insufficient documentation

## 2023-11-04 DIAGNOSIS — Z79899 Other long term (current) drug therapy: Secondary | ICD-10-CM | POA: Insufficient documentation

## 2023-11-04 DIAGNOSIS — Z8542 Personal history of malignant neoplasm of other parts of uterus: Secondary | ICD-10-CM | POA: Insufficient documentation

## 2023-11-04 DIAGNOSIS — R5383 Other fatigue: Secondary | ICD-10-CM | POA: Diagnosis not present

## 2023-11-04 DIAGNOSIS — R4781 Slurred speech: Secondary | ICD-10-CM | POA: Diagnosis not present

## 2023-11-04 DIAGNOSIS — F419 Anxiety disorder, unspecified: Secondary | ICD-10-CM | POA: Insufficient documentation

## 2023-11-04 DIAGNOSIS — E538 Deficiency of other specified B group vitamins: Secondary | ICD-10-CM

## 2023-11-04 DIAGNOSIS — F32A Depression, unspecified: Secondary | ICD-10-CM | POA: Diagnosis not present

## 2023-11-04 DIAGNOSIS — R4702 Dysphasia: Secondary | ICD-10-CM | POA: Diagnosis present

## 2023-11-04 DIAGNOSIS — Z7982 Long term (current) use of aspirin: Secondary | ICD-10-CM | POA: Diagnosis not present

## 2023-11-04 DIAGNOSIS — M6281 Muscle weakness (generalized): Secondary | ICD-10-CM | POA: Diagnosis not present

## 2023-11-04 DIAGNOSIS — T466X5A Adverse effect of antihyperlipidemic and antiarteriosclerotic drugs, initial encounter: Secondary | ICD-10-CM | POA: Diagnosis not present

## 2023-11-04 DIAGNOSIS — K219 Gastro-esophageal reflux disease without esophagitis: Secondary | ICD-10-CM | POA: Insufficient documentation

## 2023-11-04 DIAGNOSIS — G720 Drug-induced myopathy: Secondary | ICD-10-CM

## 2023-11-04 DIAGNOSIS — K589 Irritable bowel syndrome without diarrhea: Secondary | ICD-10-CM | POA: Insufficient documentation

## 2023-11-04 DIAGNOSIS — R479 Unspecified speech disturbances: Secondary | ICD-10-CM | POA: Diagnosis not present

## 2023-11-04 DIAGNOSIS — M797 Fibromyalgia: Secondary | ICD-10-CM | POA: Diagnosis not present

## 2023-11-04 DIAGNOSIS — I10 Essential (primary) hypertension: Secondary | ICD-10-CM | POA: Diagnosis not present

## 2023-11-04 DIAGNOSIS — R42 Dizziness and giddiness: Secondary | ICD-10-CM

## 2023-11-04 DIAGNOSIS — E785 Hyperlipidemia, unspecified: Secondary | ICD-10-CM | POA: Insufficient documentation

## 2023-11-04 DIAGNOSIS — G238 Other specified degenerative diseases of basal ganglia: Secondary | ICD-10-CM | POA: Diagnosis not present

## 2023-11-04 DIAGNOSIS — I639 Cerebral infarction, unspecified: Secondary | ICD-10-CM | POA: Diagnosis present

## 2023-11-04 DIAGNOSIS — R1312 Dysphagia, oropharyngeal phase: Secondary | ICD-10-CM

## 2023-11-04 LAB — CBC
HCT: 33.6 % — ABNORMAL LOW (ref 36.0–46.0)
Hemoglobin: 11.1 g/dL — ABNORMAL LOW (ref 12.0–15.0)
MCH: 30.2 pg (ref 26.0–34.0)
MCHC: 33 g/dL (ref 30.0–36.0)
MCV: 91.3 fL (ref 80.0–100.0)
Platelets: 221 10*3/uL (ref 150–400)
RBC: 3.68 MIL/uL — ABNORMAL LOW (ref 3.87–5.11)
RDW: 14.2 % (ref 11.5–15.5)
WBC: 4.2 10*3/uL (ref 4.0–10.5)
nRBC: 0 % (ref 0.0–0.2)

## 2023-11-04 LAB — COMPREHENSIVE METABOLIC PANEL
ALT: 14 U/L (ref 0–44)
AST: 19 U/L (ref 15–41)
Albumin: 3.9 g/dL (ref 3.5–5.0)
Alkaline Phosphatase: 56 U/L (ref 38–126)
Anion gap: 9 (ref 5–15)
BUN: 29 mg/dL — ABNORMAL HIGH (ref 8–23)
CO2: 23 mmol/L (ref 22–32)
Calcium: 9.4 mg/dL (ref 8.9–10.3)
Chloride: 106 mmol/L (ref 98–111)
Creatinine, Ser: 1.39 mg/dL — ABNORMAL HIGH (ref 0.44–1.00)
GFR, Estimated: 40 mL/min — ABNORMAL LOW (ref >60)
Glucose, Bld: 145 mg/dL — ABNORMAL HIGH (ref 70–99)
Potassium: 4.2 mmol/L (ref 3.5–5.1)
Sodium: 138 mmol/L (ref 135–145)
Total Bilirubin: 0.5 mg/dL (ref <1.2)
Total Protein: 7.3 g/dL (ref 6.5–8.1)

## 2023-11-04 LAB — DIFFERENTIAL
Abs Immature Granulocytes: 0.01 10*3/uL (ref 0.00–0.07)
Basophils Absolute: 0 10*3/uL (ref 0.0–0.1)
Basophils Relative: 1 %
Eosinophils Absolute: 0.2 10*3/uL (ref 0.0–0.5)
Eosinophils Relative: 4 %
Immature Granulocytes: 0 %
Lymphocytes Relative: 31 %
Lymphs Abs: 1.3 10*3/uL (ref 0.7–4.0)
Monocytes Absolute: 0.4 10*3/uL (ref 0.1–1.0)
Monocytes Relative: 9 %
Neutro Abs: 2.3 10*3/uL (ref 1.7–7.7)
Neutrophils Relative %: 55 %

## 2023-11-04 LAB — I-STAT CHEM 8, ED
BUN: 27 mg/dL — ABNORMAL HIGH (ref 8–23)
Calcium, Ion: 1.19 mmol/L (ref 1.15–1.40)
Chloride: 106 mmol/L (ref 98–111)
Creatinine, Ser: 1.5 mg/dL — ABNORMAL HIGH (ref 0.44–1.00)
Glucose, Bld: 143 mg/dL — ABNORMAL HIGH (ref 70–99)
HCT: 31 % — ABNORMAL LOW (ref 36.0–46.0)
Hemoglobin: 10.5 g/dL — ABNORMAL LOW (ref 12.0–15.0)
Potassium: 4.3 mmol/L (ref 3.5–5.1)
Sodium: 142 mmol/L (ref 135–145)
TCO2: 24 mmol/L (ref 22–32)

## 2023-11-04 LAB — PROTIME-INR
INR: 1 (ref 0.8–1.2)
Prothrombin Time: 13.1 s (ref 11.4–15.2)

## 2023-11-04 LAB — ETHANOL: Alcohol, Ethyl (B): <10 mg/dL (ref <10)

## 2023-11-04 LAB — APTT: aPTT: 26 s (ref 24–36)

## 2023-11-04 NOTE — ED Triage Notes (Signed)
Patient arrives in wheelchair by POV states yesterday around 3 pm she was cleaning the refrigerator when she began having trouble speaking along with difficulty with vision out of both eyes. States she went and got into bed and then did not wake up until 5:30am. She describes having a lapse in time. Patient feels like her speech has improved some since yesterday however feels like her tongue is still getting in the way. Also reports having dizziness.

## 2023-11-04 NOTE — Patient Instructions (Signed)
It was very nice to see you today!  Go to ER now   PLEASE NOTE:  If you had any lab tests please let us know if you have not heard back within a few days. You may see your results on MyChart before we have a chance to review them but we will give you a call once they are reviewed by [name]. If we ordered any referrals today, please let us know if you have not heard from their office within the next week.   Please try these tips to maintain a healthy lifestyle:  Eat most of your calories during the day when you are active. Eliminate processed foods including packaged sweets (pies, cakes, cookies), reduce intake of potatoes, white bread, white pasta, and white rice. Look for whole grain options, oat flour or almond flour.  Each meal should contain half fruits/vegetables, one quarter protein, and one quarter carbs (no bigger than a computer mouse).  Cut down on sweet beverages. This includes juice, soda, and sweet tea. Also watch fruit intake, though this is a healthier sweet option, it still contains natural sugar! Limit to 3 servings daily.  Drink at least 1 glass of water with each meal and aim for at least 8 glasses per day  Exercise at least 150 minutes every week.

## 2023-11-04 NOTE — ED Provider Notes (Signed)
[place] EMERGENCY DEPARTMENT AT [facility] Provider Note   CSN: [id number] Arrival date & time: 11/04/23  1623     History  Chief Complaint  Patient presents with   Aphasia    [name] is a 72 y.o. female.  Pt is a 72 yo female with pmhx significant for hypothyroidism, fibromyalgia, IBS, hld, gerd, anxiety, depression, htn, and endometrial cancer.  At 3 pm yesterday, pt suddenly developed blurry vision, trouble speaking, and weakness all over.  This occurred after cleaning.  She said her daughter helped her into bed and she went to sleep until 0530 this am.  She woke up and feels better, but still has some dizziness.  She had a routine visit to her pcp today and was told to come here for further eval.         Home Medications Prior to Admission medications   Medication Sig Start Date End Date Taking? Authorizing Provider  aspirin EC 325 MG tablet Take 325 mg by mouth daily.    [provider]  Cholecalciferol (VITAMIN D3) 25 MCG (1000 UT) capsule Take 1,000 Units by mouth daily.    [provider]  cyanocobalamin (,VITAMIN B-12,) 1000 MCG/ML injection Inject 1,000 mcg into the muscle every 30 (thirty) days.     [provider]  estradiol (ESTRING) 7.5 MCG/24HR vaginal ring Place 1 each vaginally every 3 (three) months. 09/17/23   Marguerita Beards, MD  glucose blood (ACCU-CHEK GUIDE) test strip Use to test blood sugar 3 times daily Patient taking differently: 1 each as needed. Use to test blood sugar 3 times daily 06/14/22   Jeani Sow, MD  levothyroxine (SYNTHROID) 50 MCG tablet Take 1 tablet (50 mcg total) by mouth daily before breakfast. 04/17/23   Jeani Sow, MD  MAGNESIUM CITRATE PO Take 1 tablet by mouth 2 (two) times daily.    [provider]  Multiple Vitamin (MULTIVITAMIN WITH MINERALS) TABS tablet Take 1 tablet by mouth daily. 02/15/22   Raulkar, Drema Pry, MD  pantoprazole (PROTONIX) 40 MG tablet Take  40 mg by mouth as needed. 07/10/23   [provider]  potassium chloride SA (KLOR-CON M) 20 MEQ tablet Take 1 tablet (20 mEq total) by mouth daily. 10/07/23   Jeani Sow, MD  pregabalin (LYRICA) 50 MG capsule Take 50 mg by mouth as needed ((for pain).).    [provider]  REPATHA SURECLICK 140 MG/ML SOAJ INJECT 1 PEN SUBCUTANEOUSLY EVERY 14 DAYS 06/06/23   Jeani Sow, MD  SYSTANE HYDRATION PF 0.4-0.3 % SOLN Place 1-2 drops into both eyes in the morning.    [provider]  torsemide (DEMADEX) 20 MG tablet Take 1 tablet (20 mg total) by mouth daily. 10/07/23   Jeani Sow, MD      Allergies    Atenolol, Atorvastatin, Canagliflozin, Losartan potassium, Acarbose, Cinnamon, Empagliflozin, Glimepiride, Metformin and related, Other, Pioglitazone, Pravastatin, Sitagliptin, Spironolactone-hctz, Statins, Sulfamethoxazole, Tramadol hcl, Atorvastatin calcium, Augmentin [amoxicillin-pot clavulanate], Crestor [rosuvastatin], Rosuvastatin calcium, Torsemide, Bumex [bumetanide], Lasix [furosemide], and Sulfa antibiotics    Review of Systems   Review of Systems  Neurological:        Both eye blurry vision, diffuse body weakness, difficulty speaking   All other systems reviewed and are negative.   Physical Exam Updated Vital Signs BP 138/65   Pulse 89   Temp 97.6 F (36.4 C)   Resp 12   SpO2 100%  Physical Exam Vitals reviewed.  Constitutional:  Appearance: Normal appearance. She is obese.  HENT:     Head: Normocephalic and atraumatic.     Right Ear: External ear normal.     Left Ear: External ear normal.     Nose: Nose normal.     Mouth/Throat:     Mouth: Mucous membranes are moist.     Pharynx: Oropharynx is clear.  Eyes:     Extraocular Movements: Extraocular movements intact.     Conjunctiva/sclera: Conjunctivae normal.     Pupils: Pupils are equal, round, and reactive to light.  Cardiovascular:     Rate and Rhythm: Normal rate and regular  rhythm.     Pulses: Normal pulses.     Heart sounds: Normal heart sounds.  Pulmonary:     Effort: Pulmonary effort is normal.     Breath sounds: Normal breath sounds.  Abdominal:     General: Abdomen is flat. Bowel sounds are normal.     Palpations: Abdomen is soft.  Musculoskeletal:        General: Normal range of motion.     Cervical back: Normal range of motion and neck supple.  Skin:    General: Skin is warm.     Capillary Refill: Capillary refill takes less than 2 seconds.  Neurological:     General: No focal deficit present.     Mental Status: She is alert and oriented to person, place, and time.  Psychiatric:        Mood and Affect: Mood normal.        Behavior: Behavior normal.     ED Results / Procedures / Treatments   Labs (all labs ordered are listed, but only abnormal results are displayed) Labs Reviewed  CBC - Abnormal; Notable for the following components:      Result Value   RBC 3.68 (*)    Hemoglobin 11.1 (*)    HCT 33.6 (*)    All other components within normal limits  COMPREHENSIVE METABOLIC PANEL - Abnormal; Notable for the following components:   Glucose, Bld 145 (*)    BUN 29 (*)    Creatinine, Ser 1.39 (*)    GFR, Estimated 40 (*)    All other components within normal limits  I-STAT CHEM 8, ED - Abnormal; Notable for the following components:   BUN 27 (*)    Creatinine, Ser 1.50 (*)    Glucose, Bld 143 (*)    Hemoglobin 10.5 (*)    HCT 31.0 (*)    All other components within normal limits  PROTIME-INR  APTT  DIFFERENTIAL  ETHANOL  URINALYSIS, ROUTINE W REFLEX MICROSCOPIC  CBG MONITORING, ED    EKG EKG Interpretation Date/Time:  Monday November 04 2023 16:43:12 EST Ventricular Rate:  89 PR Interval:  131 QRS Duration:  117 QT Interval:  394 QTC Calculation: 480 R Axis:   -8  Text Interpretation: Sinus rhythm Nonspecific intraventricular conduction delay Low voltage, precordial leads No significant change since last tracing  Confirmed by Jacalyn Lefevre [phone]) on 11/04/2023 7:09:37 PM  Radiology CT HEAD WO CONTRAST  Result Date: 11/04/2023 CLINICAL DATA:  Difficulty speaking, difficulty with bilateral vision, stroke suspected, dizziness EXAM: CT HEAD WITHOUT CONTRAST TECHNIQUE: Contiguous axial images were obtained from the base of the skull through the vertex without intravenous contrast. RADIATION DOSE REDUCTION: This exam was performed according to the departmental dose-optimization program which includes automated exposure control, adjustment of the mA and/or kV according to patient size and/or use of iterative reconstruction technique. COMPARISON:  10/28/2020  FINDINGS: Brain: No evidence of acute infarction, hemorrhage, mass, mass effect, or midline shift. No hydrocephalus or extra-axial fluid collection. Redemonstrated calcifications in the lentiform and dentate nuclei bilaterally. Periventricular white matter changes, likely the sequela of chronic small vessel ischemic disease. Vascular: No hyperdense vessel. Atherosclerotic calcifications in the intracranial carotid and vertebral arteries. Skull: Negative for fracture or focal lesion. Sinuses/Orbits: No acute finding. Status post bilateral lens replacements. Other: The mastoid air cells are well aerated. IMPRESSION: 1. No acute intracranial process. 2. 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 Electronically Signed   By: Wiliam Ke M.D.   On: 11/04/2023 19:56    Procedures Procedures    Medications Ordered in ED Medications - No data to display  ED Course/ Medical Decision Making/ A&P                                 Medical Decision Making Amount and/or Complexity of Data Reviewed Labs: ordered. Radiology: ordered.   This patient presents to the ED for concern of visual changes, dizziness, this involves an extensive number of treatment options, and is a complaint that  carries with it a high risk of complications and morbidity.  The differential diagnosis includes cva, tia, orthostasis, infection   Co morbidities that complicate the patient evaluation   hypothyroidism, fibromyalgia, IBS, hld, gerd, anxiety, depression, htn, and endometrial cancer   Additional history obtained:  Additional history obtained from epic chart review  Lab Tests:  I Ordered, and personally interpreted labs.  The pertinent results include:  cbc nl other than hgb low at 11.1 (stable), cmp nl other than cr sl elevated at 1.39 (chronic), inr nl, etoh neg,   Imaging Studies ordered:  I ordered imaging studies including ct head and mri brain I independently visualized and interpreted imaging which showed  CT head: No acute intracranial process.  2. 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   I agree with the radiologist interpretation   Cardiac Monitoring:  The patient was maintained on a cardiac monitor.  I personally viewed and interpreted the cardiac monitored which showed an underlying rhythm of: nsr   Medicines ordered and prescription drug management:   I have reviewed the patients home medicines and have made adjustments as needed   Test Considered:  Ct/mri   Problem List / ED Course:  Dizziness:  pt's CT nl.  Pt's mri is pending.  She is feeling better.  If MRI nl, she will need f/u with neurology.   Reevaluation:  After the interventions noted above, I reevaluated the patient and found that they have :improved   Social Determinants of Health:  Lives at home   Dispostion:  Pending at shift change        Final Clinical Impression(s) / ED Diagnoses Final diagnoses:  Blurry vision, bilateral    Rx / DC Orders ED Discharge Orders     None         Jacalyn Lefevre, MD 11/04/23 2349

## 2023-11-04 NOTE — Progress Notes (Unsigned)
Subjective:    Patient ID: [name], female    DOB: 1951/01/11, 72 y.o.   MRN: [id number]  Chief Complaint  Patient presents with   Follow-up    1 month follow-up Patient stated that she believes she had a stroke on yesterday because she was very tired and lethargic, dizziness, having a hard time walking    HPI - Accompanied by daughter.  Pt here for 1 mo f/u mult and B12, however, pt late for appt and poss CVA. Lethargic/ Fatigue - Patient states she believe she had a stroke yesterday. Was lethargic, dizzy, slurring her speech, blurred vision, and extremely fatigued around 3 pm. She went to sleep at 3 pm yesterday and did not fully wake until 5 am today. Daughter states she had slurred speech. Patient did not want EMS called. Still complains of dizziness and all over weakness. When she woke up this morning, she had no recollection of the past 14+ hours.    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 name of MD    Dementia (HCC)    Diabetes mellitus    type 2    Diverticulosis    Dyslipidemia    Dyspnea    with exertion    Dysrhythmia    "skips beats"   Endometrial cancer (HCC) 2021   Family history of adverse reaction to anesthesia    son stopped breathing during surgery , mother slow to wake up    Fibromyalgia 1980's   Generalized anxiety disorder    GERD (gastroesophageal reflux disease)    occ- will use Tums and Prolosec  prn    Heart murmur    Hyperlipemia    Hypertension    not on blood pressure meds due to  allergies - last dose of meds- months ago    Hypothyroidism    Hypothyroidism (acquired)    Internal hemorrhoids    Irritable bowel syndrome 1980's   Liver cancer (HCC)    Major depressive disorder    Migraine headaches 1980's   On meds-well controlled   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   Morbid obesity (HCC)    Pernicious anemia    Pneumonia 2009   Several times over the past several years   PONV (postoperative nausea and vomiting)    was told by neurologist due to calcificaton in brain not to be put to sleep, N/V during Op and Recovery    Recurrent upper respiratory infection (URI)    Sleep apnea    no cpap   Subarachnoid hemorrhage (HCC) 2010   Vaginal polyp    benign per pt    Past Surgical History:  Procedure Laterality Date   CATARACT EXTRACTION Right    CHOLECYSTECTOMY  1983   COLONOSCOPY  2008   DB   DILATION AND CURETTAGE OF UTERUS  1972   HYSTEROSCOPY WITH D & C  06/29/2011   Procedure: DILATATION  AND CURETTAGE (D&C) /HYSTEROSCOPY;  Surgeon: Reva Bores, MD;  Location: [facility];  Service: Gynecology;  Laterality: N/A;   ROBOTIC ASSISTED TOTAL HYSTERECTOMY WITH BILATERAL SALPINGO OOPHERECTOMY N/A 10/27/2020   Procedure: XI ROBOTIC ASSISTED TOTAL HYSTERECTOMY WITH BILATERAL SALPINGO OOPHORECTOMY;  Surgeon: Adolphus Birchwood, MD;  Location: [facility];  Service: Gynecology;  Laterality: N/A;   SENTINEL NODE BIOPSY N/A 10/27/2020   Procedure: SENTINEL NODE BIOPSY;  Surgeon: Adolphus Birchwood, MD;  Location: [facility];  Service: Gynecology;  Laterality: N/A;     Current Facility-Administered Medications:    cyanocobalamin (VITAMIN B12) injection 1,000 mcg, 1,000 mcg, Intramuscular, Q30 days, Jeani Sow, MD, 1,000 mcg at 11/04/23 1531  Current Outpatient Medications:    aspirin EC 325 MG tablet, Take 325 mg by mouth daily., Disp: , Rfl:    Cholecalciferol (VITAMIN D3) 25 MCG (1000 UT) capsule, Take 1,000 Units by mouth daily., Disp: , Rfl:    cyanocobalamin (,VITAMIN  B-12,) 1000 MCG/ML injection, Inject 1,000 mcg into the muscle every 30 (thirty) days. , Disp: , Rfl:    estradiol (ESTRING) 7.5 MCG/24HR vaginal ring, Place 1 each vaginally every 3 (three) months., Disp: 1 each, Rfl: 3   glucose blood (ACCU-CHEK GUIDE) test strip, Use to test blood sugar 3 times daily (Patient taking differently: 1 each as needed. Use to test blood sugar 3 times daily), Disp: 100 each, Rfl: 2   levothyroxine (SYNTHROID) 50 MCG tablet, Take 1 tablet (50 mcg total) by mouth daily before breakfast., Disp: 90 tablet, Rfl: 3   MAGNESIUM CITRATE PO, Take 1 tablet by mouth 2 (two) times daily., Disp: , Rfl:    Multiple Vitamin (MULTIVITAMIN WITH MINERALS) TABS tablet, Take 1 tablet by mouth daily., Disp: 30 tablet, Rfl: 0   pantoprazole (PROTONIX) 40 MG tablet, Take 40 mg by mouth as needed., Disp: , Rfl:    potassium chloride SA (KLOR-CON M) 20 MEQ tablet, Take 1 tablet (20 mEq total) by mouth daily., Disp: 90 tablet, Rfl: 1   pregabalin (LYRICA) 50 MG capsule, Take 50 mg by mouth as needed ((for pain).)., Disp: , Rfl:    REPATHA SURECLICK 140 MG/ML SOAJ, INJECT 1 PEN SUBCUTANEOUSLY EVERY 14 DAYS, Disp: 6 mL, Rfl: 3   SYSTANE HYDRATION PF 0.4-0.3 % SOLN, Place 1-2 drops into both eyes in the morning., Disp: , Rfl:    torsemide (DEMADEX) 20 MG tablet, Take 1 tablet (20 mg total) by mouth daily., Disp: 90 tablet, Rfl: 1  Allergies  Allergen Reactions   Atenolol Other (See Comments)    Stomach problems, chills   Atorvastatin Other (See Comments)    Retained fluid   Canagliflozin Other (See Comments)    ALL DIABETIC MEDS PER PT   abdominal pain, headache   Losartan Potassium Other (See Comments)    chills, HA, stomach pain   Acarbose Nausea Only and Other (See Comments)     headache,nausea   Cinnamon Other (See Comments)    Stomach pain   Empagliflozin Other (See Comments)     headaches   Glimepiride Other (See Comments)    ALL DIABETIC MEDS PER PT  abdominal pain, headache    Metformin And Related Nausea And Vomiting and Other (See Comments)    Patient states that she has severe chills, headache and cramping additionally. Says blood sugar is uncontrolled because of this   Other Other (See Comments)    ALL DIABETIC MEDS PER PT    Pioglitazone Other (See Comments)    ALL DIABETIC  MEDS PER PT = peripheral edema   Pravastatin Other (See Comments)    Retained fluid   Sitagliptin Other (See Comments)    ALL DIABETIC MEDS PER PT  Other reaction(s): headache   Spironolactone-Hctz Other (See Comments)   Statins Other (See Comments)    Myalgia   Sulfamethoxazole Other (See Comments)    ALL DIABETIC MEDS PER PT    Tramadol Hcl Itching   Atorvastatin Calcium Other (See Comments)     retain fld   Augmentin [Amoxicillin-Pot Clavulanate] Other (See Comments)    Caused jaundice   Crestor [Rosuvastatin] Other (See Comments)   Rosuvastatin Calcium    Torsemide Nausea Only    Gi upset, chills, abd pain,bloating   Bumex [Bumetanide] Rash and Other (See Comments)    Chills, rash, headaches, stomach issues also   Lasix [Furosemide] Rash and Other (See Comments)    Sugars increased   Sulfa Antibiotics Rash and Other (See Comments)    Mother has told patient in the past not to take, she cannot recall there reaction   ROS neg/noncontributory except as noted HPI/below      Objective:     BP (!) 153/89   Pulse 86   Temp 97.9 F (36.6 C) (Temporal)   Resp 18   Ht 5\' 2"  (1.575 m)   Wt 191 lb 2 oz (86.7 kg)   SpO2 98%   BMI 34.96 kg/m  Wt Readings from Last 3 Encounters:  11/04/23 191 lb 2 oz (86.7 kg)  11/01/23 189 lb (85.7 kg)  10/14/23 191 lb (86.6 kg)    Physical Exam   Gen: WDWN NAD HEENT: NCAT, conjunctiva not injected, sclera nonicteric NECK:  supple, no thyromegaly, no nodes, no carotid bruits CARDIAC: RRR, S1S2+, no murmur. DP 1+B LUNGS: CTAB. No wheezes ABDOMEN:  BS+, soft, NTND, No HSM, no masses EXT:  no edema MSK: no gross abnormalities.  - using a cane NEURO: A&O x3.  CN II-XII intact.  PSYCH: normal mood. Good eye contact     Assessment & Plan:  Dizziness  Slurred speech  Other fatigue  B12 deficiency  Statin myopathy  1.  Dizziness/slurred speech/fatigue/memory lapse-possibly TIA/CVA.  She did have some visual changes as well.  Still has dizziness.  Advised to go to the emergency room. 2.  Vitamin B12 deficiency-received injection today and right arm 3.  Chronic follow-up-not done today due to above presenting symptoms. Return in about 4 weeks (around 12/02/2023) for chronic follow-up, B12.  Melina Fiddler N Rice,acting as a scribe for Angelena Sole, MD.,have documented all relevant documentation on the behalf of Angelena Sole, MD,as directed by  Angelena Sole, MD while in the presence of Angelena Sole, MD.  I, Angelena Sole, MD, have reviewed all documentation for this visit. The documentation on 11/04/23 for the exam, diagnosis, procedures, and orders are all accurate and complete.   Angelena Sole, MD

## 2023-11-06 ENCOUNTER — Other Ambulatory Visit: Payer: Self-pay

## 2023-11-06 DIAGNOSIS — C541 Malignant neoplasm of endometrium: Secondary | ICD-10-CM

## 2023-11-07 ENCOUNTER — Inpatient Hospital Stay: Payer: 59

## 2023-11-07 ENCOUNTER — Telehealth: Payer: Self-pay | Admitting: Hematology

## 2023-11-18 ENCOUNTER — Encounter: Payer: Self-pay | Admitting: Gastroenterology

## 2023-11-21 DIAGNOSIS — H26491 Other secondary cataract, right eye: Secondary | ICD-10-CM | POA: Diagnosis not present

## 2023-11-25 ENCOUNTER — Other Ambulatory Visit: Payer: Self-pay | Admitting: Family Medicine

## 2023-11-25 DIAGNOSIS — R600 Localized edema: Secondary | ICD-10-CM

## 2023-11-25 MED ORDER — POTASSIUM CHLORIDE CRYS ER 20 MEQ PO TBCR: 20.0000 meq | EXTENDED_RELEASE_TABLET | Freq: Two times a day (BID) | ORAL | 0 refills | Status: DC

## 2023-11-25 NOTE — Progress Notes (Signed)
Per pt, taking K bid d/t a lot of cramps so out.  Advised low K may not be the cause and dangerous.  Will do short term and sch f/u appt

## 2023-11-28 ENCOUNTER — Other Ambulatory Visit: Payer: Self-pay | Admitting: Family Medicine

## 2023-11-28 DIAGNOSIS — R600 Localized edema: Secondary | ICD-10-CM

## 2023-12-02 ENCOUNTER — Telehealth: Payer: Self-pay | Admitting: Family Medicine

## 2023-12-02 NOTE — Telephone Encounter (Signed)
Spoke to patient concerning order for bed. Patient verbalized understanding.

## 2023-12-02 NOTE — Telephone Encounter (Signed)
Patient states she was returning [place] call. Requests a callback.

## 2023-12-04 ENCOUNTER — Other Ambulatory Visit: Payer: Self-pay | Admitting: Family Medicine

## 2023-12-06 ENCOUNTER — Ambulatory Visit: Payer: 59 | Admitting: Family Medicine

## 2023-12-09 ENCOUNTER — Encounter: Payer: Self-pay | Admitting: Family Medicine

## 2023-12-09 ENCOUNTER — Ambulatory Visit (INDEPENDENT_AMBULATORY_CARE_PROVIDER_SITE_OTHER): Payer: 59 | Admitting: Family Medicine

## 2023-12-09 VITALS — BP 134/68 | HR 85 | Temp 98.0°F | Ht 62.0 in | Wt 196.2 lb

## 2023-12-09 DIAGNOSIS — Z79899 Other long term (current) drug therapy: Secondary | ICD-10-CM | POA: Diagnosis not present

## 2023-12-09 DIAGNOSIS — E785 Hyperlipidemia, unspecified: Secondary | ICD-10-CM

## 2023-12-09 DIAGNOSIS — E538 Deficiency of other specified B group vitamins: Secondary | ICD-10-CM | POA: Diagnosis not present

## 2023-12-09 DIAGNOSIS — M79641 Pain in right hand: Secondary | ICD-10-CM

## 2023-12-09 DIAGNOSIS — M79642 Pain in left hand: Secondary | ICD-10-CM

## 2023-12-09 DIAGNOSIS — E038 Other specified hypothyroidism: Secondary | ICD-10-CM

## 2023-12-09 DIAGNOSIS — F419 Anxiety disorder, unspecified: Secondary | ICD-10-CM

## 2023-12-09 DIAGNOSIS — F32A Depression, unspecified: Secondary | ICD-10-CM

## 2023-12-09 DIAGNOSIS — K529 Noninfective gastroenteritis and colitis, unspecified: Secondary | ICD-10-CM | POA: Diagnosis not present

## 2023-12-09 DIAGNOSIS — R600 Localized edema: Secondary | ICD-10-CM | POA: Diagnosis not present

## 2023-12-09 LAB — COMPREHENSIVE METABOLIC PANEL
ALT: 13 U/L (ref 0–35)
AST: 19 U/L (ref 0–37)
Albumin: 3.9 g/dL (ref 3.5–5.2)
Alkaline Phosphatase: 64 U/L (ref 39–117)
BUN: 26 mg/dL — ABNORMAL HIGH (ref 6–23)
CO2: 25 meq/L (ref 19–32)
Calcium: 9.2 mg/dL (ref 8.4–10.5)
Chloride: 104 meq/L (ref 96–112)
Creatinine, Ser: 1.49 mg/dL — ABNORMAL HIGH (ref 0.40–1.20)
GFR: 34.48 mL/min — ABNORMAL LOW (ref >60.00)
Glucose, Bld: 174 mg/dL — ABNORMAL HIGH (ref 70–99)
Potassium: 4.5 meq/L (ref 3.5–5.1)
Sodium: 137 meq/L (ref 135–145)
Total Bilirubin: 0.5 mg/dL (ref 0.2–1.2)
Total Protein: 7 g/dL (ref 6.0–8.3)

## 2023-12-09 LAB — LIPID PANEL
Cholesterol: 115 mg/dL (ref 0–200)
HDL: 42.5 mg/dL (ref >39.00)
LDL Cholesterol: 34 mg/dL (ref 0–99)
NonHDL: 72.7
Total CHOL/HDL Ratio: 3
Triglycerides: 195 mg/dL — ABNORMAL HIGH (ref 0.0–149.0)
VLDL: 39 mg/dL (ref 0.0–40.0)

## 2023-12-09 LAB — VITAMIN D 25 HYDROXY (VIT D DEFICIENCY, FRACTURES): VITD: 28.11 ng/mL — ABNORMAL LOW (ref 30.00–100.00)

## 2023-12-09 LAB — TSH: TSH: 1.13 u[IU]/mL (ref 0.35–5.50)

## 2023-12-09 LAB — MAGNESIUM: Magnesium: 1.9 mg/dL (ref 1.5–2.5)

## 2023-12-09 MED ORDER — CYANOCOBALAMIN 1000 MCG/ML IJ SOLN: 1000.0000 ug | Freq: Once | INTRAMUSCULAR | Status: DC

## 2023-12-09 MED ORDER — SERTRALINE HCL 25 MG PO TABS: 25.0000 mg | ORAL_TABLET | Freq: Every day | ORAL | 3 refills | Status: DC

## 2023-12-09 NOTE — Progress Notes (Unsigned)
Subjective:     Patient ID: [name], female    DOB: 1951-12-16, 72 y.o.   MRN: [id number]  Chief Complaint  Patient presents with   1 month follow-up  +  HPI  B12 def.  Getting B12 monthly Depression-back SE to Trazodone-so stopped and better. Depression worse.  Needs more help from family but can't get.. no SI. Moods-crying a lot.  Meds in past-intol.  A lot of hand cramps, etc.  Concerned about K.  Feet at times as well. Taking K 2/day. And eating bananas   chronic diarrhea so not taking Mg.  Edema-chronic.  On torsemide 20mg  daily-if not taking, more edema.  Hypothyroidism-on 50/day HLD-on repatha.  +myopathy to statins   Health Maintenance Due  Topic Date Due   Medicare Annual Wellness (AWV)  Never done   OPHTHALMOLOGY EXAM  Never done   Zoster Vaccines- Shingrix (2 of 2) 02/23/2021   Colonoscopy  06/04/2023    Past Medical History:  Diagnosis Date   Allergy    Anemia    Arthritis    Asthma    Dianosed years ago-no meds at this time   Cancer [facility]) 2021   endometrial    Cataract    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 name of MD    Dementia (HCC)    Diabetes mellitus    type 2    Diverticulosis    Dyslipidemia    Dyspnea    with exertion    Dysrhythmia    "skips beats"   Endometrial cancer (HCC) 2021   Family history of adverse reaction to anesthesia    son stopped breathing during surgery , mother slow to wake up    Fibromyalgia 1980's   Generalized anxiety disorder    GERD (gastroesophageal reflux disease)    occ- will use Tums and Prolosec  prn    Heart murmur    Hyperlipemia    Hypertension    not on blood pressure meds due to allergies - last dose of meds- months ago    Hypothyroidism    Hypothyroidism (acquired)    Internal hemorrhoids    Irritable bowel syndrome 1980's   Liver cancer (HCC)     Major depressive disorder    Migraine headaches 1980's   On meds-well controlled   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   Morbid obesity (HCC)    Pernicious anemia    Pneumonia 2009   Several times over the past several years   PONV (postoperative nausea and vomiting)    was told by neurologist due to calcificaton in brain not to be put to sleep, N/V during Op and Recovery    Recurrent upper respiratory infection (URI)    Sleep apnea    no cpap   Subarachnoid hemorrhage (HCC) 2010   Vaginal polyp    benign per pt    Past Surgical History:  Procedure Laterality Date   CATARACT EXTRACTION Right    CHOLECYSTECTOMY  1983   COLONOSCOPY  2008   DB   DILATION AND CURETTAGE OF UTERUS  1972   HYSTEROSCOPY WITH D & C  06/29/2011   Procedure: DILATATION AND CURETTAGE (D&C) /HYSTEROSCOPY;  Surgeon: Reva Bores, MD;  Location: [facility];  Service: Gynecology;  Laterality: N/A;   ROBOTIC ASSISTED TOTAL HYSTERECTOMY WITH BILATERAL SALPINGO OOPHERECTOMY N/A 10/27/2020  Procedure: XI ROBOTIC ASSISTED TOTAL HYSTERECTOMY WITH BILATERAL SALPINGO OOPHORECTOMY;  Surgeon: Adolphus Birchwood, MD;  Location: [facility];  Service: Gynecology;  Laterality: N/A;   SENTINEL NODE BIOPSY N/A 10/27/2020   Procedure: SENTINEL NODE BIOPSY;  Surgeon: Adolphus Birchwood, MD;  Location: [facility];  Service: Gynecology;  Laterality: N/A;     Current Outpatient Medications:    aspirin EC 325 MG tablet, Take 325 mg by mouth daily., Disp: , Rfl:    Cholecalciferol (VITAMIN D3) 25 MCG (1000 UT) capsule, Take 1,000 Units by mouth daily., Disp: , Rfl:    cyanocobalamin (,VITAMIN B-12,) 1000 MCG/ML injection, Inject 1,000 mcg into the muscle every 30 (thirty) days. , Disp: , Rfl:    estradiol (ESTRING) 7.5 MCG/24HR vaginal ring, Place 1 each vaginally every 3 (three) months., Disp: 1 each, Rfl: 3   glucose blood (ACCU-CHEK GUIDE) test strip, Use to test blood sugar 3 times daily (Patient taking differently: 1 each as needed. Use to test  blood sugar 3 times daily), Disp: 100 each, Rfl: 2   levothyroxine (SYNTHROID) 50 MCG tablet, TAKE 1 TABLET BY MOUTH EVERY DAY BEFORE BREAKFAST, Disp: 90 tablet, Rfl: 3   MAGNESIUM CITRATE PO, Take 1 tablet by mouth 2 (two) times daily., Disp: , Rfl:    Multiple Vitamin (MULTIVITAMIN WITH MINERALS) TABS tablet, Take 1 tablet by mouth daily., Disp: 30 tablet, Rfl: 0   Na Sulfate-K Sulfate-Mg Sulf 17.5-3.13-1.6 GM/177ML SOLN, Take 1 kit by mouth as directed., Disp: , Rfl:    pantoprazole (PROTONIX) 20 MG tablet, Take 20 mg by mouth 2 (two) times daily., Disp: , Rfl:    pantoprazole (PROTONIX) 40 MG tablet, Take 40 mg by mouth as needed., Disp: , Rfl:    potassium chloride SA (KLOR-CON M) 20 MEQ tablet, Take 1 tablet (20 mEq total) by mouth 2 (two) times daily., Disp: 60 tablet, Rfl: 0   pregabalin (LYRICA) 50 MG capsule, Take 50 mg by mouth as needed ((for pain).)., Disp: , Rfl:    REPATHA SURECLICK 140 MG/ML SOAJ, INJECT 1 PEN SUBCUTANEOUSLY EVERY 14 DAYS, Disp: 6 mL, Rfl: 3   sertraline (ZOLOFT) 25 MG tablet, Take 1 tablet (25 mg total) by mouth daily., Disp: 30 tablet, Rfl: 3   SYSTANE HYDRATION PF 0.4-0.3 % SOLN, Place 1-2 drops into both eyes in the morning., Disp: , Rfl:    torsemide (DEMADEX) 20 MG tablet, TAKE 1 TABLET BY MOUTH ONCE DAILY, Disp: 90 tablet, Rfl: 1  Current Facility-Administered Medications:    cyanocobalamin (VITAMIN B12) injection 1,000 mcg, 1,000 mcg, Intramuscular, Q30 days, Jeani Sow, MD, 1,000 mcg at 12/09/23 1131  Allergies  Allergen Reactions   Atenolol Other (See Comments)    Stomach problems, chills   Atorvastatin Other (See Comments)    Retained fluid   Canagliflozin Other (See Comments)    ALL DIABETIC MEDS PER PT   abdominal pain, headache   Losartan Potassium Other (See Comments)    chills, HA, stomach pain   Acarbose Nausea Only and Other (See Comments)     headache,nausea   Cinnamon Other (See Comments)    Stomach pain   Empagliflozin Other  (See Comments)     headaches   Glimepiride Other (See Comments)    ALL DIABETIC MEDS PER PT  abdominal pain, headache   Metformin And Related Nausea And Vomiting and Other (See Comments)    Patient states that she has severe chills, headache and cramping additionally. Says blood sugar is uncontrolled because of this  Other Other (See Comments)    ALL DIABETIC MEDS PER PT    Pioglitazone Other (See Comments)    ALL DIABETIC MEDS PER PT = peripheral edema   Pravastatin Other (See Comments)    Retained fluid   Sitagliptin Other (See Comments)    ALL DIABETIC MEDS PER PT  Other reaction(s): headache   Spironolactone-Hctz Other (See Comments)   Statins Other (See Comments)    Myalgia   Sulfamethoxazole Other (See Comments)    ALL DIABETIC MEDS PER PT    Tramadol Hcl Itching   Atorvastatin Calcium Other (See Comments)     retain fld   Augmentin [Amoxicillin-Pot Clavulanate] Other (See Comments)    Caused jaundice   Crestor [Rosuvastatin] Other (See Comments)   Rosuvastatin Calcium    Torsemide Nausea Only    Gi upset, chills, abd pain,bloating   Bumex [Bumetanide] Rash and Other (See Comments)    Chills, rash, headaches, stomach issues also   Lasix [Furosemide] Rash and Other (See Comments)    Sugars increased   Sulfa Antibiotics Rash and Other (See Comments)    Mother has told patient in the past not to take, she cannot recall there reaction   ROS neg/noncontributory except as noted HPI/below      Objective:     BP 134/68   Pulse 85   Temp 98 F (36.7 C) (Temporal)   Ht 5\' 2"  (1.575 m)   Wt 196 lb 3.2 oz (89 kg)   SpO2 99%   BMI 35.89 kg/m  Wt Readings from Last 3 Encounters:  12/09/23 196 lb 3.2 oz (89 kg)  11/04/23 191 lb 2 oz (86.7 kg)  11/01/23 189 lb (85.7 kg)    Physical Exam   Gen: WDWN NAD HEENT: NCAT, conjunctiva not injected, sclera nonicteric NECK:  supple, no thyromegaly, no nodes, no carotid bruits CARDIAC: RRR, S1S2+, no murmur. DP 1+B LUNGS:  CTAB. No wheezes ABDOMEN:  BS+, soft, NTND, No HSM, no masses EXT:  no edema MSK: no gross abnormalities. OA hands.  cane NEURO: A&O x3.  CN II-XII intact.  PSYCH: normal mood. Good eye contact     Assessment & Plan:  B12 deficiency Assessment & Plan: Chronic.  Patient gets monthly B12 injections.  Continue   Other specified hypothyroidism Assessment & Plan: Chronic  controlled on synthroid 0.05mg .  check labs  Orders: -     TSH  Dyslipidemia Assessment & Plan: Chronic.  Controlled on Repatha.  Myopathy to statins/zetia.  Orders: -     Comprehensive metabolic panel -     Lipid panel  Local edema Assessment & Plan: Chronic.  Mult drug intolerances. contine torsemide 20 mg daily.  Controlled right now. continue K bid   Chronic diarrhea Assessment & Plan: Sees GI.  Scopes next month.  Very sensitive to meds. Check labs   Bilateral hand pain Assessment & Plan: Pt thinks from low potassium.  K has been controlled.  Discussed dangers of taking too much K.  Will check Mg.  Can take low dose otc(chronic diarrhea-may make worse).  Suspect more from her OA.   Anxiety and depression Assessment & Plan: Chronic.  Not controlled.  Very intolerant meds.  Most recently trazodone.  Willing to retry zoloft 12.5mg  daily   High risk medication use -     Comprehensive metabolic panel -     Magnesium -     VITAMIN D 25 Hydroxy (Vit-D Deficiency, Fractures)  Other orders -     Sertraline  HCl; Take 1 tablet (25 mg total) by mouth daily.  Dispense: 30 tablet; Refill: 3    Return in about 4 weeks (around 01/06/2024) for chronic follow-up,  tina for AWV.  Angelena Sole, MD

## 2023-12-09 NOTE — Patient Instructions (Addendum)
It was very nice to see you today!  Take Magnesium oxide 200mg /day or less.   1/2 tab sertraline every other day to start    PLEASE NOTE:  If you had any lab tests please let us know if you have not heard back within a few days. You may see your results on MyChart before we have a chance to review them but we will give you a call once they are reviewed by [name]. If we ordered any referrals today, please let us know if you have not heard from their office within the next week.   Please try these tips to maintain a healthy lifestyle:  Eat most of your calories during the day when you are active. Eliminate processed foods including packaged sweets (pies, cakes, cookies), reduce intake of potatoes, white bread, white pasta, and white rice. Look for whole grain options, oat flour or almond flour.  Each meal should contain half fruits/vegetables, one quarter protein, and one quarter carbs (no bigger than a computer mouse).  Cut down on sweet beverages. This includes juice, soda, and sweet tea. Also watch fruit intake, though this is a healthier sweet option, it still contains natural sugar! Limit to 3 servings daily.  Drink at least 1 glass of water with each meal and aim for at least 8 glasses per day  Exercise at least 150 minutes every week.

## 2023-12-10 MED ORDER — POTASSIUM CHLORIDE CRYS ER 20 MEQ PO TBCR: 20.0000 meq | EXTENDED_RELEASE_TABLET | Freq: Two times a day (BID) | ORAL | 1 refills | Status: DC

## 2023-12-10 NOTE — Assessment & Plan Note (Signed)
Pt thinks from low potassium.  K has been controlled.  Discussed dangers of taking too much K.  Will check Mg.  Can take low dose otc(chronic diarrhea-may make worse).  Suspect more from her OA.

## 2023-12-10 NOTE — Assessment & Plan Note (Signed)
Chronic.  Patient gets monthly B12 injections.  Continue

## 2023-12-10 NOTE — Assessment & Plan Note (Signed)
Sees GI.  Scopes next month.  Very sensitive to meds. Check labs

## 2023-12-10 NOTE — Assessment & Plan Note (Signed)
Chronic.  Controlled on Repatha.  Myopathy to statins/zetia.

## 2023-12-10 NOTE — Progress Notes (Signed)
Labs are stable/normal.  No changes in meds.   Vitamin D slightly low-can take 1000iu/d otc

## 2023-12-10 NOTE — Assessment & Plan Note (Signed)
Chronic.  Mult drug intolerances. contine torsemide 20 mg daily.  Controlled right now. continue K bid

## 2023-12-10 NOTE — Assessment & Plan Note (Signed)
Chronic.  Not controlled.  Very intolerant meds.  Most recently trazodone.  Willing to retry zoloft 12.5mg  daily

## 2023-12-10 NOTE — Assessment & Plan Note (Signed)
Chronic  controlled on synthroid 0.05mg .  check labs

## 2023-12-12 ENCOUNTER — Other Ambulatory Visit: Payer: Self-pay | Admitting: Family Medicine

## 2023-12-12 ENCOUNTER — Other Ambulatory Visit: Payer: Self-pay | Admitting: Gastroenterology

## 2023-12-23 ENCOUNTER — Telehealth: Payer: Self-pay | Admitting: Gastroenterology

## 2023-12-23 ENCOUNTER — Other Ambulatory Visit (HOSPITAL_COMMUNITY): Payer: Self-pay | Admitting: *Deleted

## 2023-12-23 DIAGNOSIS — R1310 Dysphagia, unspecified: Secondary | ICD-10-CM

## 2023-12-23 DIAGNOSIS — R1312 Dysphagia, oropharyngeal phase: Secondary | ICD-10-CM

## 2023-12-23 DIAGNOSIS — K219 Gastro-esophageal reflux disease without esophagitis: Secondary | ICD-10-CM

## 2023-12-23 NOTE — Telephone Encounter (Signed)
 PT is calling about scheduling the barium swallow as discussed at her last OV. Please advise.

## 2023-12-23 NOTE — Telephone Encounter (Signed)
 I had put the orders in for her the day of her office visit but I seen that it was never scheduled. I left a detailed message for that particular team to give her a call to schedule modified barium swallow with speech path   [phone] [facility]

## 2023-12-23 NOTE — Telephone Encounter (Signed)
 I contacted the patient and explained to her that she could call me if they did not call her by Wed. I left them a detailed message to please contact the patient

## 2023-12-23 NOTE — Telephone Encounter (Signed)
 I called [phone] to have a modified baruim swallow study scheduled

## 2023-12-30 ENCOUNTER — Ambulatory Visit (HOSPITAL_COMMUNITY)
Admission: RE | Admit: 2023-12-30 | Discharge: 2023-12-30 | Disposition: A | Discharge status: Home / Self Care | Payer: 59 | Source: Ambulatory Visit | Attending: Family Medicine | Admitting: Family Medicine

## 2023-12-30 DIAGNOSIS — I129 Hypertensive chronic kidney disease with stage 1 through stage 4 chronic kidney disease, or unspecified chronic kidney disease: Secondary | ICD-10-CM | POA: Insufficient documentation

## 2023-12-30 DIAGNOSIS — R1310 Dysphagia, unspecified: Secondary | ICD-10-CM | POA: Insufficient documentation

## 2023-12-30 DIAGNOSIS — M797 Fibromyalgia: Secondary | ICD-10-CM | POA: Diagnosis not present

## 2023-12-30 DIAGNOSIS — N189 Chronic kidney disease, unspecified: Secondary | ICD-10-CM | POA: Insufficient documentation

## 2023-12-30 DIAGNOSIS — R1312 Dysphagia, oropharyngeal phase: Secondary | ICD-10-CM

## 2023-12-30 DIAGNOSIS — E1122 Type 2 diabetes mellitus with diabetic chronic kidney disease: Secondary | ICD-10-CM | POA: Diagnosis not present

## 2023-12-30 DIAGNOSIS — K219 Gastro-esophageal reflux disease without esophagitis: Secondary | ICD-10-CM | POA: Insufficient documentation

## 2023-12-30 NOTE — Evaluation (Signed)
 Modified Barium Swallow Study  Patient Details  Name: [name] MRN: [id number] Date of Birth: 1951/05/21  Today's Date: 12/30/2023  Modified Barium Swallow completed.  Full report located under Chart Review in the Imaging Section.  History of Present Illness 73 y.o. female referred for OP MBS per Dr. Shila, GI, due to reports of occasional difficulty swallowing liquids (coming back up) and solids (trouble getting it all down), increased drooling ~ 6 months. PMHx includes DM, diverticulosis, endometrial cancer, fibromyalgia, GERD, IBS.  Plan is for endoscopy and colonoscopy.   Clinical Impression Pt demonstrated a functional oropharyngeal swallow with normal mastication and transfer of boluses of all consistencies through the pharynx. There was  reliable laryngeal vestibule closure and no episodes of penetration/aspiration. Normal pharyngeal stripping wave without concerning residue.  The PES opened adequately to allow passage of POs.  A 13 mm barium pill became lodged in the esophagus without pt's awareness. A f/u swallow of applesauce released it.  Notable during oral motor exam was strained voice with tremulous quality. Pt was unable to sustain voicing without vocal breaks.  We reviewed study in real time and discussed normal findings.  Relayed to pt that Dr. Shila will have access to results this afternoon; EGD is still pending. No further recommendations with regard to swallowing.  ENT consult may be beneficial in addressing vocal changes in the context of pt's symptomatology. Factors that may increase risk of adverse event in presence of aspiration [facility] 2021):    Swallow Evaluation Recommendations Recommendations: PO diet PO Diet Recommendation: Regular;Thin liquids (Level 0) Liquid Administration via: Cup;Straw Medication Administration: Whole meds with liquid Supervision: Patient able to self-feed Oral care recommendations: Oral care BID  (2x/day)    Carolynn Tuley L. Vona, MA CCC/SLP Clinical Specialist - Acute Care SLP Acute Rehabilitation Services Office number [phone]   Vona Palma Laurice 12/30/2023,1:35 PM

## 2023-12-31 ENCOUNTER — Encounter (HOSPITAL_COMMUNITY): Payer: Self-pay | Admitting: Gastroenterology

## 2023-12-31 NOTE — Progress Notes (Signed)
 Attempted to obtain medical history for pre op call via telephone, unable to reach at this time. HIPAA compliant voicemail message left requesting return call to pre surgical testing department.

## 2024-01-01 ENCOUNTER — Ambulatory Visit: Payer: 59 | Admitting: Podiatry

## 2024-01-01 ENCOUNTER — Telehealth: Payer: Self-pay | Admitting: *Deleted

## 2024-01-01 ENCOUNTER — Encounter: Payer: Self-pay | Admitting: Podiatry

## 2024-01-01 VITALS — Ht 62.0 in | Wt 196.0 lb

## 2024-01-01 DIAGNOSIS — M79675 Pain in left toe(s): Secondary | ICD-10-CM

## 2024-01-01 DIAGNOSIS — M79674 Pain in right toe(s): Secondary | ICD-10-CM | POA: Diagnosis not present

## 2024-01-01 DIAGNOSIS — B351 Tinea unguium: Secondary | ICD-10-CM

## 2024-01-01 DIAGNOSIS — E1122 Type 2 diabetes mellitus with diabetic chronic kidney disease: Secondary | ICD-10-CM | POA: Diagnosis not present

## 2024-01-01 DIAGNOSIS — N1831 Chronic kidney disease, stage 3a: Secondary | ICD-10-CM | POA: Diagnosis not present

## 2024-01-01 NOTE — Progress Notes (Signed)
Subjective:  Patient ID: [name], female    DOB: [birth year],  MRN: [id number]  73 y.o. female presents at risk foot care. Pt has h/o NIDDM with chronic kidney disease and painful elongated mycotic toenails 1-5 bilaterally which are tender when wearing enclosed shoe gear. Pain is relieved with periodic professional debridement. Chief Complaint  Patient presents with   [facility]   New problem(s): None   PCP is Jeani Sow, MD , and last visit was December 09, 2023.  Allergies  Allergen Reactions   Atenolol Other (See Comments)    Stomach problems, chills   Atorvastatin Other (See Comments)    Retained fluid   Canagliflozin Other (See Comments)    ALL DIABETIC MEDS PER PT   abdominal pain, headache   Losartan Potassium Other (See Comments)    chills, HA, stomach pain   Acarbose Nausea Only and Other (See Comments)     headache,nausea   Cinnamon Other (See Comments)    Stomach pain   Empagliflozin Other (See Comments)     headaches   Glimepiride Other (See Comments)    ALL DIABETIC MEDS PER PT  abdominal pain, headache   Metformin And Related Nausea And Vomiting and Other (See Comments)    Patient states that she has severe chills, headache and cramping additionally. Says blood sugar is uncontrolled because of this   Other Other (See Comments)    ALL DIABETIC MEDS PER PT    Pioglitazone Other (See Comments)    ALL DIABETIC MEDS PER PT = peripheral edema   Pravastatin Other (See Comments)    Retained fluid   Sitagliptin Other (See Comments)    ALL DIABETIC MEDS PER PT  Other reaction(s): headache   Spironolactone-Hctz Other (See Comments)   Statins Other (See Comments)    Myalgia   Sulfamethoxazole Other (See Comments)    ALL DIABETIC MEDS PER PT    Tramadol Hcl Itching   Atorvastatin Calcium Other (See Comments)     retain fld   Augmentin [Amoxicillin-Pot Clavulanate] Other (See Comments)    Caused jaundice   Crestor [Rosuvastatin] Other (See Comments)    Rosuvastatin Calcium    Torsemide Nausea Only    Gi upset, chills, abd pain,bloating   Bumex [Bumetanide] Rash and Other (See Comments)    Chills, rash, headaches, stomach issues also   Lasix [Furosemide] Rash and Other (See Comments)    Sugars increased   Sulfa Antibiotics Rash and Other (See Comments)    Mother has told patient in the past not to take, she cannot recall there reaction    Review of Systems: Negative except as noted in the HPI.   Objective:  [name] is a pleasant 73 y.o. female WD, WN in NAD. AAO x 3.  Vascular Examination: Vascular status intact b/l with palpable pedal pulses. CFT immediate b/l. Pedal hair present. No edema. No pain with calf compression b/l. Skin temperature gradient WNL b/l. No varicosities noted. No cyanosis or clubbing noted.  Neurological Examination: Sensation grossly intact b/l with 10 gram monofilament.   Dermatological Examination: Pedal skin with normal turgor, texture and tone b/l. No open wounds nor interdigital macerations noted. Toenails 1-5 b/l thick, discolored, elongated with subungual debris and pain on dorsal palpation. No hyperkeratotic lesions noted b/l.   Musculoskeletal Examination: Muscle strength 5/5 to b/l LE.  HAV with bunion deformity noted b/l LE. Uses cane for ambulation assistance today.  Radiographs: None  Last A1c:      Latest Ref  Rng & Units 10/07/2023    2:45 PM 07/02/2023    2:47 PM 03/05/2023    1:44 PM  Hemoglobin A1C  Hemoglobin-A1c 4.6 - 6.5 % 6.8  7.7 Repeated and verified X2.  7.2      Assessment:   1. Pain due to onychomycosis of toenails of both feet   2. Type 2 diabetes mellitus with stage 3a chronic kidney disease, without long-term current use of insulin (HCC)    Plan:  Patient was evaluated and treated. All patient's and/or POA's questions/concerns addressed on today's visit. 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 Continue soft, supportive shoe gear  daily. Report any pedal injuries to medical professional. Call office if there are any quesitons/concerns. -Continue foot and shoe inspections daily. Monitor blood glucose per PCP/Endocrinologist's recommendations. -Patient/POA to call should there be question/concern in the interim.  Return in about 3 months (around 03/31/2024).  Freddie Breech, DPM      [facility] LOCATION: [address], [place] [address]                   Office [phone]   [facility] LOCATION: [address] [place], [place] [address] Office [phone]

## 2024-01-01 NOTE — Telephone Encounter (Signed)
 [name] requested we contact her in January regarding the PREP class referral. Voice message left today for call back for more information.

## 2024-01-02 ENCOUNTER — Ambulatory Visit: Payer: 59 | Admitting: Obstetrics and Gynecology

## 2024-01-06 NOTE — Anesthesia Preprocedure Evaluation (Signed)
Anesthesia Evaluation  Patient identified by MRN, date of birth, ID band Patient awake    Reviewed: Allergy & Precautions, NPO status , Patient's Chart, lab work & pertinent test results  History of Anesthesia Complications (+) PONV and history of anesthetic complications  Airway Mallampati: II  TM Distance: >3 FB Neck ROM: Full    Dental no notable dental hx. (+) Teeth Intact, Dental Advisory Given   Pulmonary asthma    Pulmonary exam normal breath sounds clear to auscultation       Cardiovascular hypertension, Normal cardiovascular exam Rhythm:Regular Rate:Normal     Neuro/Psych   Anxiety Depression   Dementia    GI/Hepatic   Endo/Other  diabetes    Renal/GU      Musculoskeletal   Abdominal   Peds  Hematology   Anesthesia Other Findings All: see list   Reproductive/Obstetrics                             Anesthesia Physical Anesthesia Plan  ASA: 3  Anesthesia Plan: MAC   Post-op Pain Management: Minimal or no pain anticipated   Induction:   PONV Risk Score and Plan: 2 and Treatment may vary due to age or medical condition and Propofol infusion  Airway Management Planned: Simple Face Mask and Natural Airway  Additional Equipment: None  Intra-op Plan:   Post-operative Plan:   Informed Consent: I have reviewed the patients History and Physical, chart, labs and discussed the procedure including the risks, benefits and alternatives for the proposed anesthesia with the patient or authorized representative who has indicated his/her understanding and acceptance.     Dental advisory given  Plan Discussed with: CRNA and Anesthesiologist  Anesthesia Plan Comments: (Colonoscopy for hx of colon polyps)       Anesthesia Quick Evaluation

## 2024-01-07 ENCOUNTER — Telehealth: Payer: Self-pay | Admitting: Gastroenterology

## 2024-01-07 ENCOUNTER — Ambulatory Visit: Payer: 59 | Admitting: Family Medicine

## 2024-01-07 ENCOUNTER — Encounter (HOSPITAL_COMMUNITY)
Admission: RE | Disposition: A | Discharge status: Home / Self Care | Payer: Self-pay | Source: Ambulatory Visit | Attending: Gastroenterology

## 2024-01-07 ENCOUNTER — Encounter (HOSPITAL_COMMUNITY): Payer: Self-pay | Admitting: Gastroenterology

## 2024-01-07 ENCOUNTER — Ambulatory Visit: Payer: 59 | Admitting: Cardiovascular Disease

## 2024-01-07 ENCOUNTER — Ambulatory Visit (HOSPITAL_COMMUNITY)
Admission: RE | Admit: 2024-01-07 | Discharge: 2024-01-07 | Disposition: A | Discharge status: Home / Self Care | Payer: 59 | Source: Ambulatory Visit | Attending: Gastroenterology | Admitting: Gastroenterology

## 2024-01-07 ENCOUNTER — Other Ambulatory Visit: Payer: Self-pay

## 2024-01-07 ENCOUNTER — Ambulatory Visit (HOSPITAL_COMMUNITY): Payer: 59 | Admitting: Anesthesiology

## 2024-01-07 ENCOUNTER — Ambulatory Visit (HOSPITAL_BASED_OUTPATIENT_CLINIC_OR_DEPARTMENT_OTHER): Payer: 59 | Admitting: Anesthesiology

## 2024-01-07 DIAGNOSIS — K648 Other hemorrhoids: Secondary | ICD-10-CM

## 2024-01-07 DIAGNOSIS — K5790 Diverticulosis of intestine, part unspecified, without perforation or abscess without bleeding: Secondary | ICD-10-CM | POA: Diagnosis not present

## 2024-01-07 DIAGNOSIS — I129 Hypertensive chronic kidney disease with stage 1 through stage 4 chronic kidney disease, or unspecified chronic kidney disease: Secondary | ICD-10-CM | POA: Diagnosis not present

## 2024-01-07 DIAGNOSIS — M6289 Other specified disorders of muscle: Secondary | ICD-10-CM

## 2024-01-07 DIAGNOSIS — E1122 Type 2 diabetes mellitus with diabetic chronic kidney disease: Secondary | ICD-10-CM | POA: Insufficient documentation

## 2024-01-07 DIAGNOSIS — D638 Anemia in other chronic diseases classified elsewhere: Secondary | ICD-10-CM

## 2024-01-07 DIAGNOSIS — N189 Chronic kidney disease, unspecified: Secondary | ICD-10-CM | POA: Diagnosis not present

## 2024-01-07 DIAGNOSIS — D509 Iron deficiency anemia, unspecified: Secondary | ICD-10-CM

## 2024-01-07 DIAGNOSIS — K580 Irritable bowel syndrome with diarrhea: Secondary | ICD-10-CM

## 2024-01-07 DIAGNOSIS — K2211 Ulcer of esophagus with bleeding: Secondary | ICD-10-CM | POA: Diagnosis not present

## 2024-01-07 DIAGNOSIS — K5730 Diverticulosis of large intestine without perforation or abscess without bleeding: Secondary | ICD-10-CM

## 2024-01-07 DIAGNOSIS — R1310 Dysphagia, unspecified: Secondary | ICD-10-CM | POA: Diagnosis not present

## 2024-01-07 DIAGNOSIS — R1319 Other dysphagia: Secondary | ICD-10-CM

## 2024-01-07 DIAGNOSIS — Z860100 Personal history of colon polyps, unspecified: Secondary | ICD-10-CM

## 2024-01-07 DIAGNOSIS — K2210 Ulcer of esophagus without bleeding: Secondary | ICD-10-CM | POA: Diagnosis not present

## 2024-01-07 DIAGNOSIS — Z79899 Other long term (current) drug therapy: Secondary | ICD-10-CM | POA: Insufficient documentation

## 2024-01-07 DIAGNOSIS — K644 Residual hemorrhoidal skin tags: Secondary | ICD-10-CM

## 2024-01-07 DIAGNOSIS — K529 Noninfective gastroenteritis and colitis, unspecified: Secondary | ICD-10-CM

## 2024-01-07 DIAGNOSIS — K2950 Unspecified chronic gastritis without bleeding: Secondary | ICD-10-CM | POA: Diagnosis not present

## 2024-01-07 DIAGNOSIS — K6389 Other specified diseases of intestine: Secondary | ICD-10-CM | POA: Diagnosis not present

## 2024-01-07 DIAGNOSIS — K2971 Gastritis, unspecified, with bleeding: Secondary | ICD-10-CM

## 2024-01-07 DIAGNOSIS — I2510 Atherosclerotic heart disease of native coronary artery without angina pectoris: Secondary | ICD-10-CM | POA: Diagnosis not present

## 2024-01-07 DIAGNOSIS — R159 Full incontinence of feces: Secondary | ICD-10-CM

## 2024-01-07 DIAGNOSIS — I10 Essential (primary) hypertension: Secondary | ICD-10-CM | POA: Diagnosis not present

## 2024-01-07 DIAGNOSIS — K3189 Other diseases of stomach and duodenum: Secondary | ICD-10-CM | POA: Insufficient documentation

## 2024-01-07 DIAGNOSIS — K2970 Gastritis, unspecified, without bleeding: Secondary | ICD-10-CM | POA: Diagnosis not present

## 2024-01-07 DIAGNOSIS — D649 Anemia, unspecified: Secondary | ICD-10-CM

## 2024-01-07 DIAGNOSIS — K449 Diaphragmatic hernia without obstruction or gangrene: Secondary | ICD-10-CM | POA: Insufficient documentation

## 2024-01-07 HISTORY — PX: BIOPSY: SHX5522

## 2024-01-07 HISTORY — PX: ESOPHAGOGASTRODUODENOSCOPY (EGD) WITH PROPOFOL: SHX5813

## 2024-01-07 HISTORY — PX: COLONOSCOPY WITH PROPOFOL: SHX5780

## 2024-01-07 LAB — GLUCOSE, CAPILLARY: Glucose-Capillary: 131 mg/dL — ABNORMAL HIGH (ref 70–99)

## 2024-01-07 SURGERY — COLONOSCOPY WITH PROPOFOL
Anesthesia: Monitor Anesthesia Care

## 2024-01-07 MED ORDER — SODIUM CHLORIDE 0.9 % IV SOLN: INTRAVENOUS | Status: DC

## 2024-01-07 MED ORDER — PROPOFOL 10 MG/ML IV BOLUS
INTRAVENOUS | Status: DC | PRN
  Administered 2024-01-07: 30 mg via INTRAVENOUS

## 2024-01-07 MED ORDER — PROPOFOL 500 MG/50ML IV EMUL
INTRAVENOUS | Status: DC | PRN
  Administered 2024-01-07: 130 ug/kg/min via INTRAVENOUS

## 2024-01-07 MED ORDER — PANTOPRAZOLE SODIUM 40 MG PO TBEC: 40.0000 mg | DELAYED_RELEASE_TABLET | Freq: Two times a day (BID) | ORAL | 3 refills | Status: DC

## 2024-01-07 MED ORDER — PROPOFOL 1000 MG/100ML IV EMUL
INTRAVENOUS | Status: AC
  Filled 2024-01-07: qty 100

## 2024-01-07 MED ORDER — LIDOCAINE 2% (20 MG/ML) 5 ML SYRINGE
INTRAMUSCULAR | Status: DC | PRN
  Administered 2024-01-07: 100 mg via INTRAVENOUS

## 2024-01-07 MED ORDER — PANTOPRAZOLE SODIUM 20 MG PO TBEC: 40.0000 mg | DELAYED_RELEASE_TABLET | Freq: Two times a day (BID) | ORAL | 4 refills | Status: DC

## 2024-01-07 SURGICAL SUPPLY — 24 items
BLOCK BITE 60FR ADLT L/F BLUE (MISCELLANEOUS) ×3 IMPLANT
ELECT REM PT RETURN 9FT ADLT (ELECTROSURGICAL)
ELECTRODE REM PT RTRN 9FT ADLT (ELECTROSURGICAL) IMPLANT
FCP BXJMBJMB 240X2.8X (CUTTING FORCEPS)
FLOOR PAD 36X40 (MISCELLANEOUS) ×2
FORCEP RJ3 GP 1.8X160 W-NEEDLE (CUTTING FORCEPS) IMPLANT
FORCEPS BIOP RAD 4 LRG CAP 4 (CUTTING FORCEPS) IMPLANT
FORCEPS BIOP RJ4 240 W/NDL (CUTTING FORCEPS)
FORCEPS BXJMBJMB 240X2.8X (CUTTING FORCEPS) IMPLANT
INJECTOR/SNARE I SNARE (MISCELLANEOUS) IMPLANT
LUBRICANT JELLY 4.5OZ STERILE (MISCELLANEOUS) IMPLANT
MANIFOLD NEPTUNE II (INSTRUMENTS) IMPLANT
NDL SCLEROTHERAPY 25GX240 (NEEDLE) IMPLANT
NEEDLE SCLEROTHERAPY 25GX240 (NEEDLE)
PAD FLOOR 36X40 (MISCELLANEOUS) ×3 IMPLANT
PROBE APC STR FIRE (PROBE) IMPLANT
PROBE INJECTION GOLD 7FR (MISCELLANEOUS) IMPLANT
SNARE ROTATE MED OVAL 20MM (MISCELLANEOUS) IMPLANT
SNARE SHORT THROW 13M SML OVAL (MISCELLANEOUS) IMPLANT
SYR 50ML LL SCALE MARK (SYRINGE) IMPLANT
TRAP SPECIMEN MUCOUS 40CC (MISCELLANEOUS) IMPLANT
TUBING ENDO SMARTCAP PENTAX (MISCELLANEOUS) ×6 IMPLANT
TUBING IRRIGATION ENDOGATOR (MISCELLANEOUS) ×3 IMPLANT
WATER STERILE IRR 1000ML POUR (IV SOLUTION) IMPLANT

## 2024-01-07 NOTE — Op Note (Addendum)
[facility] Patient Name: [name] Procedure Date: 01/07/2024 MRN: [id number] Attending MD: Napoleon Form , MD, [id number] Date of Birth: November 07, 1951 CSN: [id number] Age: 73 Admit Type: Outpatient Procedure:                Upper GI endoscopy Indications:              Gastrointestinal bleeding of unknown origin,                            Dysphagia Providers:                Napoleon Form, MD, Lorenza Evangelist, RN, Rhodia Albright, Technician, Marja Kays, Technician Referring MD:              Medicines:                Monitored Anesthesia Care Complications:            No immediate complications. Estimated Blood Loss:     Estimated blood loss was minimal. Procedure:                Pre-Anesthesia Assessment:                           - Prior to the procedure, a History and Physical                            was performed, and patient medications and                            allergies were reviewed. The patient's tolerance of                            previous anesthesia was also reviewed. The risks                            and benefits of the procedure and the sedation                            options and risks were discussed with the patient.                            All questions were answered, and informed consent                            was obtained. Prior Anticoagulants: The patient has                            taken no anticoagulant or antiplatelet agents                            except for aspirin. ASA Grade Assessment: III - A  patient with severe systemic disease. After                            reviewing the risks and benefits, the patient was                            deemed in satisfactory condition to undergo the                            procedure.                           After obtaining informed consent, the endoscope was                            passed under  direct vision. Throughout the                            procedure, the patient's blood pressure, pulse, and                            oxygen saturations were monitored continuously. The                            GIF-H190 ([id number]) Olympus endoscope was introduced                            through the mouth, and advanced to the second part                            of duodenum. The upper GI endoscopy was                            accomplished without difficulty. The patient                            tolerated the procedure well. Scope In: Scope Out: Findings:      Three cratered and superficial esophageal ulcers with no bleeding and no       stigmata of recent bleeding were found 36 to 38 cm from the incisors.       The largest lesion was 2 mm in largest dimension.      A 2 cm hiatal hernia was present.      Patchy mild inflammation characterized by erythema and friability was       found in the gastric antrum and in the prepyloric region of the stomach.       Biopsies were taken with a cold forceps for histology. Biopsies were       taken with a cold forceps for Helicobacter pylori testing.      The cardia and gastric fundus were normal on retroflexion.      Patchy mildly erythematous mucosa without active bleeding and with no       stigmata of bleeding was found in the duodenal bulb. Impression:               - Esophageal ulcers with no bleeding  and no                            stigmata of recent bleeding.                           - 2 cm hiatal hernia.                           - Gastritis. Biopsied.                           - Erythematous duodenopathy. Moderate Sedation:      N/A Recommendation:           - Patient has a contact number available for                            emergencies. The signs and symptoms of potential                            delayed complications were discussed with the                            patient. Return to normal activities tomorrow.                             Written discharge instructions were provided to the                            patient.                           - Resume previous diet.                           - Continue present medications.                           - Await pathology results.                           - Follow an antireflux regimen.                           - Use Protonix (pantoprazole) 40 mg PO BID.                           - Follow up in office visit next available in 2-3                            months Procedure Code(s):        --- Professional ---                           [phone], Esophagogastroduodenoscopy, flexible,                            transoral;  with biopsy, single or multiple Diagnosis Code(s):        --- Professional ---                           K22.10, Ulcer of esophagus without bleeding                           K44.9, Diaphragmatic hernia without obstruction or                            gangrene                           K29.70, Gastritis, unspecified, without bleeding                           K31.89, Other diseases of stomach and duodenum                           K92.2, Gastrointestinal hemorrhage, unspecified                           R13.10, Dysphagia, unspecified CPT copyright 2022 American [employer] Association. All rights reserved. The codes documented in this report are preliminary and upon coder review may  be revised to meet current compliance requirements. Napoleon Form, MD 01/07/2024 11:06:04 AM This report has been signed electronically. Number of Addenda: 0

## 2024-01-07 NOTE — Anesthesia Postprocedure Evaluation (Signed)
Anesthesia Post Note  Patient: [name]  Procedure(s) Performed: COLONOSCOPY WITH PROPOFOL ESOPHAGOGASTRODUODENOSCOPY (EGD) WITH PROPOFOL BIOPSY     Patient location during evaluation: Endoscopy Anesthesia Type: MAC Level of consciousness: awake and alert Pain management: pain level controlled Vital Signs Assessment: post-procedure vital signs reviewed and stable Respiratory status: spontaneous breathing, nonlabored ventilation, respiratory function stable and patient connected to nasal cannula oxygen Cardiovascular status: blood pressure returned to baseline and stable Postop Assessment: no apparent nausea or vomiting Anesthetic complications: no   No notable events documented.  Last Vitals:  Vitals:   01/07/24 1030 01/07/24 1040  BP: 134/63 (!) 140/68  Pulse: 84 86  Resp: 20 (!) 24  Temp:    SpO2: 99% 100%    Last Pain:  Vitals:   01/07/24 1040  TempSrc:   PainSc: 4                  Trevor Iha

## 2024-01-07 NOTE — Transfer of Care (Signed)
Immediate Anesthesia Transfer of Care Note  Patient: [name]  Procedure(s) Performed: COLONOSCOPY WITH PROPOFOL ESOPHAGOGASTRODUODENOSCOPY (EGD) WITH PROPOFOL BIOPSY  Patient Location: PACU  Anesthesia Type:MAC  Level of Consciousness: sedated  Airway & Oxygen Therapy: Patient Spontanous Breathing and Patient connected to face mask oxygen  Post-op Assessment: Report given to RN and Post -op Vital signs reviewed and stable  Post vital signs: Reviewed and stable  Last Vitals:  Vitals Value Taken Time  BP    Temp    Pulse    Resp    SpO2      Last Pain:  Vitals:   01/07/24 0840  TempSrc: Temporal  PainSc: 4          Complications: No notable events documented.

## 2024-01-07 NOTE — Telephone Encounter (Signed)
Lawson Fiscal from [facility] called and stated that they had received a prescription for this patient and stated that unfortunately her insurance will not pay for the protonix 22 MG . Lawson Fiscal from [employer] [facility] stated that the insurance will pay for 40 MG of Protonix 2x a day. Please advise.

## 2024-01-07 NOTE — Discharge Instructions (Signed)
YOU HAD AN ENDOSCOPIC PROCEDURE TODAY: Refer to the procedure report and other information in the discharge instructions given to you for any specific questions about what was found during the examination. If this information does not answer your questions, please call [place] office at [phone] to clarify.   YOU SHOULD EXPECT: Some feelings of bloating in the abdomen. Passage of more gas than usual. Walking can help get rid of the air that was put into your GI tract during the procedure and reduce the bloating. If you had a lower endoscopy (such as a colonoscopy or flexible sigmoidoscopy) you may notice spotting of blood in your stool or on the toilet paper. Some abdominal soreness may be present for a day or two, also.  DIET: Your first meal following the procedure should be a light meal and then it is ok to progress to your normal diet. A half-sandwich or bowl of soup is an example of a good first meal. Heavy or fried foods are harder to digest and may make you feel nauseous or bloated. Drink plenty of fluids but you should avoid alcoholic beverages for 24 hours. If you had a esophageal dilation, please see attached instructions for diet.    ACTIVITY: Your care partner should take you home directly after the procedure. You should plan to take it easy, moving slowly for the rest of the day. You can resume normal activity the day after the procedure however YOU SHOULD NOT DRIVE, use power tools, machinery or perform tasks that involve climbing or major physical exertion for 24 hours (because of the sedation medicines used during the test).   SYMPTOMS TO REPORT IMMEDIATELY: A gastroenterologist can be reached at any hour. Please call [phone]  for any of the following symptoms:  Following lower endoscopy (colonoscopy, flexible sigmoidoscopy) Excessive amounts of blood in the stool  Significant tenderness, worsening of abdominal pains  Swelling of the abdomen that is new, acute  Fever of 100 or  higher  Following upper endoscopy (EGD, EUS, ERCP, esophageal dilation) Vomiting of blood or coffee ground material  New, significant abdominal pain  New, significant chest pain or pain under the shoulder blades  Painful or persistently difficult swallowing  New shortness of breath  Black, tarry-looking or red, bloody stools  FOLLOW UP:  If any biopsies were taken you will be contacted by phone or by letter within the next 1-3 weeks. Call [phone]  if you have not heard about the biopsies in 3 weeks.  Please also call with any specific questions about appointments or follow up tests.YOU HAD AN ENDOSCOPIC PROCEDURE TODAY: Refer to the procedure report and other information in the discharge instructions given to you for any specific questions about what was found during the examination. If this information does not answer your questions, please call [place] office at [phone] to clarify.   YOU SHOULD EXPECT: Some feelings of bloating in the abdomen. Passage of more gas than usual. Walking can help get rid of the air that was put into your GI tract during the procedure and reduce the bloating. If you had a lower endoscopy (such as a colonoscopy or flexible sigmoidoscopy) you may notice spotting of blood in your stool or on the toilet paper. Some abdominal soreness may be present for a day or two, also.  DIET: Your first meal following the procedure should be a light meal and then it is ok to progress to your normal diet. A half-sandwich or bowl of soup is an example of a   good first meal. Heavy or fried foods are harder to digest and may make you feel nauseous or bloated. Drink plenty of fluids but you should avoid alcoholic beverages for 24 hours. If you had a esophageal dilation, please see attached instructions for diet.    ACTIVITY: Your care partner should take you home directly after the procedure. You should plan to take it easy, moving slowly for the rest of the day. You can resume  normal activity the day after the procedure however YOU SHOULD NOT DRIVE, use power tools, machinery or perform tasks that involve climbing or major physical exertion for 24 hours (because of the sedation medicines used during the test).   SYMPTOMS TO REPORT IMMEDIATELY: A gastroenterologist can be reached at any hour. Please call [phone]  for any of the following symptoms:  Following lower endoscopy (colonoscopy, flexible sigmoidoscopy) Excessive amounts of blood in the stool  Significant tenderness, worsening of abdominal pains  Swelling of the abdomen that is new, acute  Fever of 100 or higher  Following upper endoscopy (EGD, EUS, ERCP, esophageal dilation) Vomiting of blood or coffee ground material  New, significant abdominal pain  New, significant chest pain or pain under the shoulder blades  Painful or persistently difficult swallowing  New shortness of breath  Black, tarry-looking or red, bloody stools  FOLLOW UP:  If any biopsies were taken you will be contacted by phone or by letter within the next 1-3 weeks. Call [phone]  if you have not heard about the biopsies in 3 weeks.  Please also call with any specific questions about appointments or follow up tests. 

## 2024-01-07 NOTE — Telephone Encounter (Signed)
Resent pantoprazole as 40 mg twice a day, the way its covered by her insurance

## 2024-01-07 NOTE — H&P (Signed)
[facility] History and Physical   Primary Care Physician:  Jeani Sow, MD   Reason for Procedure:   Dysphagia, chronic diarrhea, chronic anemia  Plan:    EGD and colonoscopy with possible intervention     HPI: [name] is a 73 y.o. female here for EGD and colonoscopy for evaluation of chronic dysphagia, diarrhea and anemia Please refer to office visit note for additional details The risks and benefits as well as alternatives of endoscopic procedure(s) have been discussed and reviewed. All questions answered. The patient agrees to proceed.    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 name of MD    Dementia (HCC)    Diabetes mellitus    type 2    Diverticulosis    Dyslipidemia    Dyspnea    with exertion    Dysrhythmia    "skips beats"   Endometrial cancer (HCC) 2021   Family history of adverse reaction to anesthesia    son stopped breathing during surgery , mother slow to wake up    Fibromyalgia 1980's   Generalized anxiety disorder    GERD (gastroesophageal reflux disease)    occ- will use Tums and Prolosec  prn    Heart murmur    Hyperlipemia    Hypertension    not on blood pressure meds due to allergies - last dose of meds- months ago    Hypothyroidism    Hypothyroidism (acquired)    Internal hemorrhoids    Irritable bowel syndrome 1980's   Liver cancer (HCC)    Major depressive disorder    Migraine headaches 1980's   On meds-well controlled   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   Morbid obesity (HCC)    Pernicious anemia    Pneumonia 2009   Several times over the past several years   PONV (postoperative nausea and  vomiting)    was told by neurologist due to calcificaton in brain not to be put to sleep, N/V during Op and Recovery    Recurrent upper respiratory infection (URI)    Sleep apnea    no cpap   Subarachnoid hemorrhage (HCC) 2010   Vaginal polyp    benign per pt    Past Surgical History:  Procedure Laterality Date   CATARACT EXTRACTION Right    CHOLECYSTECTOMY  1983   COLONOSCOPY  2008   DB   DILATION AND CURETTAGE OF UTERUS  1972   HYSTEROSCOPY WITH D & C  06/29/2011   Procedure: DILATATION AND CURETTAGE (D&C) /HYSTEROSCOPY;  Surgeon: Reva Bores, MD;  Location: [facility];  Service: Gynecology;  Laterality: N/A;   ROBOTIC ASSISTED TOTAL HYSTERECTOMY WITH BILATERAL SALPINGO OOPHERECTOMY N/A 10/27/2020   Procedure: XI ROBOTIC ASSISTED TOTAL HYSTERECTOMY WITH BILATERAL SALPINGO OOPHORECTOMY;  Surgeon: Adolphus Birchwood, MD;  Location: [facility];  Service: Gynecology;  Laterality: N/A;   SENTINEL NODE BIOPSY N/A 10/27/2020   Procedure: SENTINEL NODE BIOPSY;  Surgeon: Adolphus Birchwood, MD;  Location: [facility];  Service: Gynecology;  Laterality: N/A;    Prior to Admission medications   Medication Sig Start Date  End Date Taking? Authorizing Provider  aspirin EC 325 MG tablet Take 325 mg by mouth daily.   Yes [provider]  Cholecalciferol (VITAMIN D3) 25 MCG (1000 UT) capsule Take 1,000 Units by mouth daily.   Yes [provider]  levothyroxine (SYNTHROID) 50 MCG tablet TAKE 1 TABLET BY MOUTH EVERY DAY BEFORE BREAKFAST 12/04/23  Yes Jeani Sow, MD  MAGNESIUM CITRATE PO Take 1 tablet by mouth 2 (two) times daily.   Yes [provider]  pantoprazole (PROTONIX) 20 MG tablet Take 20 mg by mouth 2 (two) times daily. 11/28/23  Yes [provider]  pantoprazole (PROTONIX) 40 MG tablet TAKE 1 TABLET BY MOUTH DAILY 12/16/23  Yes Jasma Seevers, Eleonore Chiquito, MD  potassium chloride SA (KLOR-CON M) 20 MEQ tablet Take 1 tablet (20 mEq total) by mouth 2 (two) times daily. 12/10/23  Yes  Jeani Sow, MD  REPATHA SURECLICK 140 MG/ML SOAJ INJECT 1 PEN SUBCUTANEOUSLY EVERY 14 DAYS 06/06/23  Yes Jeani Sow, MD  sertraline (ZOLOFT) 25 MG tablet Take 1 tablet (25 mg total) by mouth daily. 12/09/23  Yes Jeani Sow, MD  torsemide (DEMADEX) 20 MG tablet TAKE 1 TABLET BY MOUTH ONCE DAILY 11/28/23  Yes Jeani Sow, MD  estradiol (ESTRING) 7.5 MCG/24HR vaginal ring Place 1 each vaginally every 3 (three) months. 09/17/23   Marguerita Beards, MD  glucose blood (ACCU-CHEK GUIDE) test strip Use to test blood sugar 3 times daily Patient taking differently: 1 each as needed. Use to test blood sugar 3 times daily 06/14/22   Jeani Sow, MD  Multiple Vitamin (MULTIVITAMIN WITH MINERALS) TABS tablet Take 1 tablet by mouth daily. 02/15/22   Raulkar, Drema Pry, MD  Na Sulfate-K Sulfate-Mg Sulf 17.5-3.13-1.6 GM/177ML SOLN Take 1 kit by mouth as directed. 11/01/23   [provider]  pregabalin (LYRICA) 50 MG capsule Take 50 mg by mouth as needed ((for pain).).    [provider]  SYSTANE HYDRATION PF 0.4-0.3 % SOLN Place 1-2 drops into both eyes in the morning.    [provider]    Current Facility-Administered Medications  Medication Dose Route Frequency Provider Last Rate Last Admin   0.9 %  sodium chloride infusion   Intravenous Continuous Nara Paternoster, Eleonore Chiquito, MD        Allergies as of 11/01/2023 - Review Complete 11/01/2023  Allergen Reaction Noted   Atenolol Other (See Comments) 09/29/2019   Atorvastatin Other (See Comments) 05/20/2018   Canagliflozin Other (See Comments) 05/20/2018   Losartan potassium Other (See Comments) 05/20/2018   Acarbose Nausea Only and Other (See Comments) 05/20/2018   Cinnamon Other (See Comments) 06/15/2015   Empagliflozin Other (See Comments) 11/28/2021   Glimepiride Other (See Comments) 05/20/2018   Metformin and related Nausea And Vomiting and Other (See Comments) 06/26/2011   Other Other (See Comments)  04/15/2020   Pioglitazone Other (See Comments) 04/15/2020   Pravastatin Other (See Comments) 05/20/2018   Sitagliptin Other (See Comments) 05/20/2018   Spironolactone-hctz Other (See Comments) 11/28/2021   Statins Other (See Comments) 10/14/2020   Sulfamethoxazole Other (See Comments) 03/24/2007   Tramadol hcl Itching 10/29/2020   Atorvastatin calcium Other (See Comments) 11/28/2021   Augmentin [amoxicillin-pot clavulanate] Other (See Comments) 03/27/2023   Crestor [rosuvastatin] Other (See Comments) 11/28/2021   Rosuvastatin calcium  03/14/2022   Torsemide Nausea Only 07/02/2023   Bumex [bumetanide] Rash and Other (See Comments) 05/04/2022   Lasix [furosemide] Rash and Other (See Comments) 07/24/2022   Sulfa  antibiotics Rash and Other (See Comments) 06/16/2011    Family History  Problem Relation Age of Onset   Anesthesia problems Mother        hard to wake post op    Cerebrovascular Disease Mother        Never had stroke, but had CEA.   Liver cancer Father        mets to liver    Bladder Cancer Father    Colon cancer Father        mets to colon    Schizophrenia Daughter        Schizoaffective disorder   Anesthesia problems Son        stopped breathing post op    Tremor Son    Anesthesia problems Granddaughter        PONV   High blood pressure Other    Diabetes Other    Arthritis Other    Schizophrenia Child    Tremor Child    Dementia Neg Hx    Esophageal cancer Neg Hx    Rectal cancer Neg Hx    Stomach cancer Neg Hx    Colon polyps Neg Hx     Social History   Socioeconomic History   Marital status: Divorced    Spouse name: Not on file   Number of children: 3   Years of education: 12   Highest education level: Not on file  Occupational History   Occupation: Retired     Comment: nanny   Occupation: retired  Tobacco Use   Smoking status: Never   Smokeless tobacco: Never  Vaping Use   Vaping status: Never Used  Substance and Sexual Activity   Alcohol  use: Not Currently    Comment: quit 1978   Drug use: Never   Sexual activity: Not Currently  Other Topics Concern   Not on file  Social History Narrative    Lives at home. Her granddaughter lives with her and daughter    Right handed   Social Drivers of Corporate investment banker Strain: Not on file  Food Insecurity: Not on file  Transportation Needs: Not on file  Physical Activity: Not on file  Stress: Not on file  Social Connections: Not on file  Intimate Partner Violence: Not on file    Review of Systems:  All other review of systems negative except as mentioned in the HPI.  Physical Exam: Vital signs in last 24 hours: Temp:  [97.7 F (36.5 C)] 97.7 F (36.5 C) (01/21 0840) Pulse Rate:  [91] 91 (01/21 0840) Resp:  [25] 25 (01/21 0840) BP: (145)/(57) 145/57 (01/21 0840) SpO2:  [100 %] 100 % (01/21 0840) Weight:  [82.1 kg] 82.1 kg (01/21 0840)   General:   Alert, NAD Lungs:  Clear .   Heart:  Regular rate and rhythm Abdomen:  Soft, nontender and nondistended. Neuro/Psych:  Alert and cooperative. Normal mood and affect. A and O x 3   K. Scherry Ran , MD [phone]

## 2024-01-07 NOTE — Op Note (Signed)
[facility], [facility], [facility] Patient Name: [name] Procedure Date: 01/07/2024 MRN: [id number] Attending MD: Napoleon Form , MD, [id number] Date of Birth: 09-04-1951 CSN: [id number] Age: 73 Admit Type: Outpatient Procedure:                Colonoscopy Indications:              Chronic diarrhea Providers:                Napoleon Form, MD, Lorenza Evangelist, RN, Rhodia Albright, Technician, Marja Kays, Technician Referring MD:              Medicines:                Monitored Anesthesia Care Complications:            No immediate complications. Estimated Blood Loss:     Estimated blood loss was minimal. Procedure:                Pre-Anesthesia Assessment:                           - Prior to the procedure, a History and Physical                            was performed, and patient medications and                            allergies were reviewed. The patient's tolerance of                            previous anesthesia was also reviewed. The risks                            and benefits of the procedure and the sedation                            options and risks were discussed with the patient.                            All questions were answered, and informed consent                            was obtained. Prior Anticoagulants: The patient has                            taken no anticoagulant or antiplatelet agents. ASA                            Grade Assessment: III - A patient with severe                            systemic disease. After reviewing the risks and  benefits, the patient was deemed in satisfactory                            condition to undergo the procedure.                           After obtaining informed consent, the colonoscope                            was passed under direct vision. Throughout the                            procedure, the patient's blood pressure, pulse, and                             oxygen saturations were monitored continuously. The                            PCF-HQ190L ([id number]) Olympus colonoscope was                            introduced through the anus and advanced to the the                            cecum, identified by appendiceal orifice and                            ileocecal valve. The colonoscopy was performed                            without difficulty. The patient tolerated the                            procedure well. The quality of the bowel                            preparation was excellent. The ileocecal valve,                            appendiceal orifice, and rectum were photographed. Scope In: 9:50:38 AM Scope Out: 10:08:05 AM Scope Withdrawal Time: 0 hours 10 minutes 46 seconds  Total Procedure Duration: 0 hours 17 minutes 27 seconds  Findings:      The perianal and digital rectal examinations were normal.      Normal mucosa was found in the entire colon. Biopsies for histology were       taken with a cold forceps from the right colon and left colon for       evaluation of microscopic colitis.      Scattered large-mouthed, medium-mouthed and small-mouthed diverticula       were found in the sigmoid colon, descending colon, transverse colon and       ascending colon.      Non-bleeding external and internal hemorrhoids were found during       retroflexion. The hemorrhoids were medium-sized. Impression:               -  Normal mucosa in the entire examined colon.                            Biopsied.                           - Moderate diverticulosis in the sigmoid colon, in                            the descending colon, in the transverse colon and                            in the ascending colon.                           - Non-bleeding external and internal hemorrhoids. Moderate Sedation:      N/A Recommendation:           - Patient has a contact number available for                            emergencies. The  signs and symptoms of potential                            delayed complications were discussed with the                            patient. Return to normal activities tomorrow.                            Written discharge instructions were provided to the                            patient.                           - Resume previous diet.                           - Continue present medications.                           - Await pathology results.                           - No repeat colonoscopy due to age. Procedure Code(s):        --- Professional ---                           [id number], Colonoscopy, flexible; with biopsy, single                            or multiple Diagnosis Code(s):        --- Professional ---                           K64.8, Other hemorrhoids  K52.9, Noninfective gastroenteritis and colitis,                            unspecified                           K57.30, Diverticulosis of large intestine without                            perforation or abscess without bleeding CPT copyright 2022 American [employer] Association. All rights reserved. The codes documented in this report are preliminary and upon coder review may  be revised to meet current compliance requirements. Napoleon Form, MD 01/07/2024 10:36:08 AM This report has been signed electronically. Number of Addenda: 0

## 2024-01-08 LAB — SURGICAL PATHOLOGY

## 2024-01-10 ENCOUNTER — Encounter (HOSPITAL_COMMUNITY): Payer: Self-pay | Admitting: Gastroenterology

## 2024-01-14 ENCOUNTER — Encounter: Payer: Self-pay | Admitting: Gastroenterology

## 2024-01-17 ENCOUNTER — Encounter (HOSPITAL_BASED_OUTPATIENT_CLINIC_OR_DEPARTMENT_OTHER): Payer: 59 | Admitting: Cardiovascular Disease

## 2024-01-20 ENCOUNTER — Ambulatory Visit: Payer: 59 | Admitting: Family Medicine

## 2024-01-20 ENCOUNTER — Encounter: Payer: Self-pay | Admitting: Family Medicine

## 2024-01-20 VITALS — BP 128/83 | HR 81 | Temp 97.9°F | Resp 18 | Ht 62.0 in | Wt 181.1 lb

## 2024-01-20 DIAGNOSIS — E538 Deficiency of other specified B group vitamins: Secondary | ICD-10-CM | POA: Diagnosis not present

## 2024-01-20 DIAGNOSIS — N1831 Chronic kidney disease, stage 3a: Secondary | ICD-10-CM | POA: Diagnosis not present

## 2024-01-20 DIAGNOSIS — Z789 Other specified health status: Secondary | ICD-10-CM

## 2024-01-20 DIAGNOSIS — E1122 Type 2 diabetes mellitus with diabetic chronic kidney disease: Secondary | ICD-10-CM | POA: Diagnosis not present

## 2024-01-20 DIAGNOSIS — F32A Depression, unspecified: Secondary | ICD-10-CM

## 2024-01-20 DIAGNOSIS — F419 Anxiety disorder, unspecified: Secondary | ICD-10-CM

## 2024-01-20 DIAGNOSIS — I10 Essential (primary) hypertension: Secondary | ICD-10-CM

## 2024-01-20 DIAGNOSIS — D638 Anemia in other chronic diseases classified elsewhere: Secondary | ICD-10-CM

## 2024-01-20 MED ORDER — ESCITALOPRAM OXALATE 10 MG PO TABS: 5.0000 mg | ORAL_TABLET | Freq: Every day | ORAL | 1 refills | Status: DC

## 2024-01-20 NOTE — Progress Notes (Signed)
Subjective:     Patient ID: [name], female    DOB: 12-May-1951, 73 y.o.   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  History of Present Illness   The patient presents with concerns following a recent colonoscopy and endoscopy.  They recently underwent a colonoscopy and endoscopy on January 21st, which revealed melanosis in the colon, gastritis, stress-related ulcers, a hernia, and diverticular disease. They are concerned about the black pigmentation in the colon and its implications. No use of herbal tea or senna, which were suggested as potential causes for the melanosis.  They experience severe diarrhea and black rectal discharge. They have been informed of esophageal inflammation with erosion, gastrointestinal hemorrhage, and hemorrhoidal hemorrhage. They are taking Protonix 40 mg twice daily, which causes stomach ache and bloating. They are worried about ongoing bleeding and potential anemia, with the last blood work done on November 18th.  They are currently taking torsemide, which they resumed recently, and sertraline (Zoloft) 25 mg, which they believe causes an upset stomach and worsened vision. They have a history of macular degeneration and note improved vision when not taking sertraline. Severe chills, stomach pain, and nausea are associated with torsemide. intolerance to lasix,bumex,hctz, spironolactone,amiloride.   They experienced bilateral epistaxis eight days ago and a subsequent subconjunctival hemorrhage in the right eye, which appeared yellow. They also note a yellowish tint to their skin and increased  trembling.  They have a history of anxiety and have been taking sertraline, which has helped with anxiety but caused side effects. They are interested in exploring alternative medications for anxiety management.       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 name of MD    Dementia (HCC)    Diabetes mellitus    type 2    Diverticulosis    Dyslipidemia    Dyspnea    with exertion    Dysrhythmia    "skips beats"   Endometrial cancer (HCC) 2021   Family history of adverse reaction to anesthesia    son stopped breathing during surgery , mother slow to wake up    Fibromyalgia 1980's   Generalized anxiety disorder    GERD (gastroesophageal reflux disease)    occ- will use Tums and Prolosec  prn    Heart murmur    Hyperlipemia    Hypertension    not on blood pressure meds due to allergies - last dose of meds- months ago    Hypothyroidism    Hypothyroidism (acquired)  Internal hemorrhoids    Irritable bowel syndrome 1980's   Liver cancer (HCC)    Major depressive disorder    Migraine headaches 1980's   On meds-well controlled   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   Morbid obesity (HCC)    Pernicious anemia    Pneumonia 2009   Several times over the past several years   PONV (postoperative nausea and vomiting)    was told by neurologist due to calcificaton in brain not to be put to sleep, N/V during Op and Recovery    Recurrent upper respiratory infection (URI)    Sleep apnea    no cpap    Subarachnoid hemorrhage (HCC) 2010   Vaginal polyp    benign per pt    Past Surgical History:  Procedure Laterality Date   BIOPSY  01/07/2024   Procedure: BIOPSY;  Surgeon: Napoleon Form, MD;  Location: [facility];  Service: Gastroenterology;;   CATARACT EXTRACTION Right    CHOLECYSTECTOMY  1983   COLONOSCOPY  2008   DB   COLONOSCOPY WITH PROPOFOL N/A 01/07/2024   Procedure: COLONOSCOPY WITH PROPOFOL;  Surgeon: Napoleon Form, MD;  Location: [facility];  Service: Gastroenterology;  Laterality: N/A;   DILATION AND CURETTAGE OF UTERUS  1972   ESOPHAGOGASTRODUODENOSCOPY (EGD) WITH PROPOFOL N/A 01/07/2024   Procedure: ESOPHAGOGASTRODUODENOSCOPY (EGD) WITH PROPOFOL;  Surgeon: Napoleon Form, MD;  Location: [facility];  Service: Gastroenterology;  Laterality: N/A;   HYSTEROSCOPY WITH D & C  06/29/2011   Procedure: DILATATION AND CURETTAGE (D&C) /HYSTEROSCOPY;  Surgeon: Reva Bores, MD;  Location: [facility];  Service: Gynecology;  Laterality: N/A;   ROBOTIC ASSISTED TOTAL HYSTERECTOMY WITH BILATERAL SALPINGO OOPHERECTOMY N/A 10/27/2020   Procedure: XI ROBOTIC ASSISTED TOTAL HYSTERECTOMY WITH BILATERAL SALPINGO OOPHORECTOMY;  Surgeon: Adolphus Birchwood, MD;  Location: [facility];  Service: Gynecology;  Laterality: N/A;   SENTINEL NODE BIOPSY N/A 10/27/2020   Procedure: SENTINEL NODE BIOPSY;  Surgeon: Adolphus Birchwood, MD;  Location: [facility];  Service: Gynecology;  Laterality: N/A;     Current Outpatient Medications:    aspirin EC 325 MG tablet, Take 325 mg by mouth daily., Disp: , Rfl:    Cholecalciferol (VITAMIN D3) 25 MCG (1000 UT) capsule, Take 1,000 Units by mouth daily., Disp: , Rfl:    escitalopram (LEXAPRO) 10 MG tablet, Take 0.5 tablets (5 mg total) by mouth daily., Disp: 30 tablet, Rfl: 1   estradiol (ESTRING) 7.5 MCG/24HR vaginal ring, Place 1 each vaginally every 3 (three) months., Disp: 1 each, Rfl: 3   glucose blood (ACCU-CHEK GUIDE) test strip, Use to test blood sugar 3 times  daily (Patient taking differently: 1 each as needed. Use to test blood sugar 3 times daily), Disp: 100 each, Rfl: 2   levothyroxine (SYNTHROID) 50 MCG tablet, TAKE 1 TABLET BY MOUTH EVERY DAY BEFORE BREAKFAST, Disp: 90 tablet, Rfl: 3   MAGNESIUM CITRATE PO, Take 1 tablet by mouth 2 (two) times daily., Disp: , Rfl:    Multiple Vitamin (MULTIVITAMIN WITH MINERALS) TABS tablet, Take 1 tablet by mouth daily., Disp: 30 tablet, Rfl: 0   pantoprazole (PROTONIX) 40 MG tablet, Take 1 tablet (40 mg total) by mouth 2 (two) times daily before a meal., Disp: 180 tablet, Rfl: 3   potassium chloride SA (KLOR-CON M) 20 MEQ tablet, Take 1 tablet (20 mEq total) by mouth 2 (two) times daily., Disp: 180 tablet, Rfl: 1   pregabalin (LYRICA) 50 MG capsule, Take 50 mg by mouth as needed ((for pain).)., Disp: ,  Rfl:    REPATHA SURECLICK 140 MG/ML SOAJ, INJECT 1 PEN SUBCUTANEOUSLY EVERY 14 DAYS, Disp: 6 mL, Rfl: 3   SYSTANE HYDRATION PF 0.4-0.3 % SOLN, Place 1-2 drops into both eyes in the morning., Disp: , Rfl:    torsemide (DEMADEX) 20 MG tablet, TAKE 1 TABLET BY MOUTH ONCE DAILY, Disp: 90 tablet, Rfl: 1  Current Facility-Administered Medications:    cyanocobalamin (VITAMIN B12) injection 1,000 mcg, 1,000 mcg, Intramuscular, Q30 days, Jeani Sow, MD, 1,000 mcg at 01/20/24 1526  Allergies  Allergen Reactions   Atenolol Other (See Comments)    Stomach problems, chills   Atorvastatin Other (See Comments)    Retained fluid   Canagliflozin Other (See Comments)    ALL DIABETIC MEDS PER PT   abdominal pain, headache   Losartan Potassium Other (See Comments)    chills, HA, stomach pain   Acarbose Nausea Only and Other (See Comments)     headache,nausea   Cinnamon Other (See Comments)    Stomach pain   Empagliflozin Other (See Comments)     headaches   Glimepiride Other (See Comments)    ALL DIABETIC MEDS PER PT  abdominal pain, headache   Metformin And Related Nausea And Vomiting and Other (See Comments)     Patient states that she has severe chills, headache and cramping additionally. Says blood sugar is uncontrolled because of this   Other Other (See Comments)    ALL DIABETIC MEDS PER PT    Pioglitazone Other (See Comments)    ALL DIABETIC MEDS PER PT = peripheral edema   Pravastatin Other (See Comments)    Retained fluid   Sitagliptin Other (See Comments)    ALL DIABETIC MEDS PER PT  Other reaction(s): headache   Spironolactone-Hctz Other (See Comments)   Statins Other (See Comments)    Myalgia   Sulfamethoxazole Other (See Comments)    ALL DIABETIC MEDS PER PT    Tramadol Hcl Itching   Atorvastatin Calcium Other (See Comments)     retain fld   Augmentin [Amoxicillin-Pot Clavulanate] Other (See Comments)    Caused jaundice   Crestor [Rosuvastatin] Other (See Comments)   Rosuvastatin Calcium    Torsemide Nausea Only    Gi upset, chills, abd pain,bloating   Bumex [Bumetanide] Rash and Other (See Comments)    Chills, rash, headaches, stomach issues also   Lasix [Furosemide] Rash and Other (See Comments)    Sugars increased   Sulfa Antibiotics Rash and Other (See Comments)    Mother has told patient in the past not to take, she cannot recall there reaction   ROS neg/noncontributory except as noted HPI/below      Objective:     BP 128/83   Pulse 81   Temp 97.9 F (36.6 C) (Temporal)   Resp 18   Ht 5\' 2"  (1.575 m)   Wt 181 lb 2 oz (82.2 kg)   SpO2 98%   BMI 33.13 kg/m  Wt Readings from Last 3 Encounters:  01/20/24 181 lb 2 oz (82.2 kg)  01/07/24 181 lb (82.1 kg)  01/01/24 196 lb (88.9 kg)    Physical Exam   Gen: [facility]DWN NAD HEENT: NCAT, conjunctiva not injected, sclera nonicteric NECK:  supple, no thyromegaly, no nodes, no carotid bruits CARDIAC: RRR, S1S2+, no murmur. DP 1+B LUNGS: CTAB. No wheezes ABDOMEN:  BS+, soft, NTND, No HSM, no masses EXT:  no edema MSK: no gross abnormalities. cane NEURO: A&O x3.  CN II-XII intact.  PSYCH: normal mood.  Good eye  contact     Assessment & Plan:  B12 deficiency -     Vitamin B12  Essential hypertension -     Comprehensive metabolic panel -     Magnesium  Anemia, chronic disease -     CBC with Differential/Platelet -     IBC + Ferritin  Type 2 diabetes mellitus with stage 3a chronic kidney disease, without long-term current use of insulin (HCC) -     Hemoglobin A1c  Anxiety and depression  Medication intolerance  Other orders -     Escitalopram Oxalate; Take 0.5 tablets (5 mg total) by mouth daily.  Dispense: 30 tablet; Refill: 1  Assessment and Plan    Epistaxis and Subconjunctival Hemorrhage Bilateral epistaxis and a subconjunctival hemorrhage in the right eye were reported eight days ago. The skin appears slightly jaundiced. Monitor for recurrence and evaluate for underlying causes if symptoms persist.  Adverse Reaction to Torsemide Severe chills, stomach pain, and nausea occurred with torsemide. Previous diuretics caused rashes. Adjust torsemide to every other day and monitor for side effects and efficacy.  more intolerance to other diuretics  Adverse Reaction to Sertraline Upset stomach and worsened vision were noted with sertraline. Discontinue sertraline and initiate escitalopram (Lexapro) at 2.5 mg daily for one week, then increase to 5 mg daily.  Gastrointestinal Ulcers and Gastritis Endoscopy showed no H. pylori but revealed melanosis, gastritis, and ulcers likely due to stress. Protonix previously caused stomach ache and bloating. Continue Protonix 40 mg twice daily and monitor for symptom improvement and side effects.  Melanosis Coli Endoscopy revealed melanosis coli, not likely due to herbal tea or senna as use is denied. Discuss findings with a gastroenterologist and consider alternative causes.  Diverticulosis Known diverticulosis with no new symptoms. Monitor for symptoms of diverticulitis.  Vitamin B12 Deficiency Receiving monthly B12 injections, with the last on  December 23rd. Order a blood test for B12 levels and administer the injection after the test.  General Health Maintenance Routine health maintenance was discussed. Order blood work to assess anemia, administer the B12 injection after the blood test, and schedule a follow-up appointment in one month.   bp's well controlled DM type 2 w/ckd-intolerant to mult meds.  trying to limit diet  Follow-up Schedule a follow-up appointment in one month, call the gastroenterologist to discuss melanosis coli findings, and monitor for side effects and efficacy of the new medication regimen.        Return in about 4 weeks (around 02/17/2024) for mood, chronic follow-up.  Angelena Sole, MD

## 2024-01-20 NOTE — Patient Instructions (Addendum)
It was very nice to see you today!  Try the torsemide every other day and see how you do. Lexapro 1/2 tab daily for 1 wk, then whole tab   PLEASE NOTE:  If you had any lab tests please let us know if you have not heard back within a few days. You may see your results on MyChart before we have a chance to review them but we will give you a call once they are reviewed by [name]. If we ordered any referrals today, please let us know if you have not heard from their office within the next week.   Please try these tips to maintain a healthy lifestyle:  Eat most of your calories during the day when you are active. Eliminate processed foods including packaged sweets (pies, cakes, cookies), reduce intake of potatoes, white bread, white pasta, and white rice. Look for whole grain options, oat flour or almond flour.  Each meal should contain half fruits/vegetables, one quarter protein, and one quarter carbs (no bigger than a computer mouse).  Cut down on sweet beverages. This includes juice, soda, and sweet tea. Also watch fruit intake, though this is a healthier sweet option, it still contains natural sugar! Limit to 3 servings daily.  Drink at least 1 glass of water with each meal and aim for at least 8 glasses per day  Exercise at least 150 minutes every week.

## 2024-01-21 ENCOUNTER — Encounter: Payer: Self-pay | Admitting: Family Medicine

## 2024-01-21 DIAGNOSIS — N182 Chronic kidney disease, stage 2 (mild): Secondary | ICD-10-CM | POA: Diagnosis not present

## 2024-01-21 LAB — CBC WITH DIFFERENTIAL/PLATELET
Basophils Absolute: 0 10*3/uL (ref 0.0–0.1)
Basophils Relative: 0.7 % (ref 0.0–3.0)
Eosinophils Absolute: 0.1 10*3/uL (ref 0.0–0.7)
Eosinophils Relative: 3 % (ref 0.0–5.0)
HCT: 34.9 % — ABNORMAL LOW (ref 36.0–46.0)
Hemoglobin: 11.6 g/dL — ABNORMAL LOW (ref 12.0–15.0)
Lymphocytes Relative: 23.1 % (ref 12.0–46.0)
Lymphs Abs: 1 10*3/uL (ref 0.7–4.0)
MCHC: 33.2 g/dL (ref 30.0–36.0)
MCV: 91.2 fL (ref 78.0–100.0)
Monocytes Absolute: 0.4 10*3/uL (ref 0.1–1.0)
Monocytes Relative: 9 % (ref 3.0–12.0)
Neutro Abs: 2.9 10*3/uL (ref 1.4–7.7)
Neutrophils Relative %: 64.2 % (ref 43.0–77.0)
Platelets: 233 10*3/uL (ref 150.0–400.0)
RBC: 3.83 Mil/uL — ABNORMAL LOW (ref 3.87–5.11)
RDW: 14.5 % (ref 11.5–15.5)
WBC: 4.5 10*3/uL (ref 4.0–10.5)

## 2024-01-21 LAB — IBC + FERRITIN
Ferritin: 138.8 ng/mL (ref 10.0–291.0)
Iron: 58 ug/dL (ref 42–145)
Saturation Ratios: 18.1 % — ABNORMAL LOW (ref 20.0–50.0)
TIBC: 320.6 ug/dL (ref 250.0–450.0)
Transferrin: 229 mg/dL (ref 212.0–360.0)

## 2024-01-21 LAB — COMPREHENSIVE METABOLIC PANEL
ALT: 10 U/L (ref 0–35)
AST: 18 U/L (ref 0–37)
Albumin: 3.8 g/dL (ref 3.5–5.2)
Alkaline Phosphatase: 54 U/L (ref 39–117)
BUN: 24 mg/dL — ABNORMAL HIGH (ref 6–23)
CO2: 26 meq/L (ref 19–32)
Calcium: 9.4 mg/dL (ref 8.4–10.5)
Chloride: 107 meq/L (ref 96–112)
Creatinine, Ser: 1.51 mg/dL — ABNORMAL HIGH (ref 0.40–1.20)
GFR: 33.9 mL/min — ABNORMAL LOW (ref >60.00)
Glucose, Bld: 120 mg/dL — ABNORMAL HIGH (ref 70–99)
Potassium: 4.4 meq/L (ref 3.5–5.1)
Sodium: 141 meq/L (ref 135–145)
Total Bilirubin: 0.7 mg/dL (ref 0.2–1.2)
Total Protein: 7 g/dL (ref 6.0–8.3)

## 2024-01-21 LAB — MAGNESIUM: Magnesium: 1.6 mg/dL (ref 1.5–2.5)

## 2024-01-21 LAB — VITAMIN B12: Vitamin B-12: 606 pg/mL (ref 211–911)

## 2024-01-21 LAB — HEMOGLOBIN A1C: Hgb A1c MFr Bld: 6 % (ref 4.6–6.5)

## 2024-01-21 NOTE — Progress Notes (Signed)
Labs stable.  Sugars much better.

## 2024-01-22 ENCOUNTER — Other Ambulatory Visit: Payer: Self-pay | Admitting: Family Medicine

## 2024-01-28 ENCOUNTER — Ambulatory Visit (INDEPENDENT_AMBULATORY_CARE_PROVIDER_SITE_OTHER): Payer: 59

## 2024-01-28 VITALS — Wt 181.0 lb

## 2024-01-28 DIAGNOSIS — Z0000 Encounter for general adult medical examination without abnormal findings: Secondary | ICD-10-CM

## 2024-01-28 NOTE — Progress Notes (Signed)
Subjective:   [name] is a 73 y.o. female who presents for Medicare Annual (Subsequent) preventive examination.  Visit Complete: Virtual I connected with  [name] on 01/28/24 by a audio enabled telemedicine application and verified that I am speaking with the correct person using two identifiers.  Patient Location: Home  Provider Location: Office/Clinic  I discussed the limitations of evaluation and management by telemedicine. The patient expressed understanding and agreed to proceed.  Vital Signs: Because this visit was a virtual/telehealth visit, some criteria may be missing or patient reported. Any vitals not documented were not able to be obtained and vitals that have been documented are patient reported.   Cardiac Risk Factors include: advanced age (>108men, >63 women);diabetes mellitus;dyslipidemia;hypertension;obesity (BMI >30kg/m2)     Objective:    Today's Vitals   01/28/24 1409  Weight: 181 lb (82.1 kg)   Body mass index is 33.11 kg/m.     01/28/2024    2:20 PM 01/07/2024    8:42 AM 11/04/2023    4:38 PM 05/24/2023    6:03 PM 03/26/2023    6:16 PM 10/23/2022    4:21 PM 02/26/2022    1:01 PM  Advanced Directives  Does Patient Have a Medical Advance Directive? No No Yes No No No No  Would patient like information on creating a medical advance directive? No - Patient declined Yes (MAU/Ambulatory/Procedural Areas - Information given)   No - Patient declined No - Patient declined     Current Medications (verified) Outpatient Encounter Medications as of 01/28/2024  Medication Sig   aspirin EC 325 MG tablet Take 325 mg by mouth daily.   Cholecalciferol (VITAMIN D3) 25 MCG (1000 UT) capsule Take 1,000 Units by mouth daily.   estradiol (ESTRING) 7.5 MCG/24HR vaginal ring Place 1 each vaginally every 3 (three) months.   glucose blood (ACCU-CHEK GUIDE) test strip Use to test blood sugar 3 times daily (Patient taking differently: 1 each as needed. Use to test  blood sugar 3 times daily)   levothyroxine (SYNTHROID) 50 MCG tablet TAKE 1 TABLET BY MOUTH EVERY DAY BEFORE BREAKFAST   MAGNESIUM CITRATE PO Take 1 tablet by mouth 2 (two) times daily.   Multiple Vitamin (MULTIVITAMIN WITH MINERALS) TABS tablet Take 1 tablet by mouth daily.   pantoprazole (PROTONIX) 40 MG tablet Take 1 tablet (40 mg total) by mouth 2 (two) times daily before a meal.   potassium chloride SA (KLOR-CON M) 20 MEQ tablet Take 1 tablet (20 mEq total) by mouth 2 (two) times daily.   REPATHA SURECLICK 140 MG/ML SOAJ INJECT 1 PEN SUBCUTANEOUSLY EVERY 14 DAYS   SYSTANE HYDRATION PF 0.4-0.3 % SOLN Place 1-2 drops into both eyes in the morning.   torsemide (DEMADEX) 20 MG tablet TAKE 1 TABLET BY MOUTH ONCE DAILY   escitalopram (LEXAPRO) 10 MG tablet Take 0.5 tablets (5 mg total) by mouth daily. (Patient not taking: Reported on 01/28/2024)   pregabalin (LYRICA) 50 MG capsule Take 50 mg by mouth as needed ((for pain).). (Patient not taking: Reported on 01/28/2024)   Facility-Administered Encounter Medications as of 01/28/2024  Medication   cyanocobalamin (VITAMIN B12) injection 1,000 mcg    Allergies (verified) Atenolol, Atorvastatin, Canagliflozin, Losartan potassium, Acarbose, Cinnamon, Empagliflozin, Glimepiride, Metformin and related, Other, Pioglitazone, Pravastatin, Sitagliptin, Spironolactone-hctz, Statins, Sulfamethoxazole, Tramadol hcl, Atorvastatin calcium, Augmentin [amoxicillin-pot clavulanate], Crestor [rosuvastatin], Rosuvastatin calcium, Torsemide, Bumex [bumetanide], Lasix [furosemide], and Sulfa antibiotics   History: Past Medical History:  Diagnosis Date   Allergy  Anemia    Arthritis    Asthma    Dianosed years ago-no meds at this time   Cancer [facility]) 2021   endometrial    Cataract    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 name of  MD    Dementia (HCC)    Diabetes mellitus    type 2    Diverticulosis    Dyslipidemia    Dyspnea    with exertion    Dysrhythmia    "skips beats"   Endometrial cancer (HCC) 2021   Family history of adverse reaction to anesthesia    son stopped breathing during surgery , mother slow to wake up    Fibromyalgia 1980's   Generalized anxiety disorder    GERD (gastroesophageal reflux disease)    occ- will use Tums and Prolosec  prn    Heart murmur    Hyperlipemia    Hypertension    not on blood pressure meds due to allergies - last dose of meds- months ago    Hypothyroidism    Hypothyroidism (acquired)    Internal hemorrhoids    Irritable bowel syndrome 1980's   Liver cancer (HCC)    Major depressive disorder    Migraine headaches 1980's   On meds-well controlled   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   Morbid obesity (HCC)    Pernicious anemia    Pneumonia 2009   Several times over the past several years   PONV (postoperative nausea and vomiting)    was told by neurologist due to calcificaton in brain not to be put to sleep, N/V during Op and Recovery    Recurrent upper respiratory infection (URI)    Sleep apnea    no cpap   Subarachnoid hemorrhage (HCC) 2010   Vaginal polyp    benign per pt   Past Surgical History:  Procedure Laterality Date   BIOPSY  01/07/2024   Procedure: BIOPSY;  Surgeon: Napoleon Form, MD;  Location: [facility];  Service: Gastroenterology;;   CATARACT EXTRACTION Right    CHOLECYSTECTOMY  1983   COLONOSCOPY  2008   DB   COLONOSCOPY WITH PROPOFOL N/A 01/07/2024   Procedure: COLONOSCOPY WITH PROPOFOL;  Surgeon: Napoleon Form, MD;  Location: [facility];  Service: Gastroenterology;  Laterality: N/A;   DILATION AND CURETTAGE OF UTERUS  1972   ESOPHAGOGASTRODUODENOSCOPY (EGD) WITH PROPOFOL N/A 01/07/2024   Procedure: ESOPHAGOGASTRODUODENOSCOPY (EGD) WITH PROPOFOL;  Surgeon: Napoleon Form, MD;  Location: [facility];  Service:  Gastroenterology;  Laterality: N/A;   HYSTEROSCOPY WITH D & C  06/29/2011   Procedure: DILATATION AND CURETTAGE (D&C) /HYSTEROSCOPY;  Surgeon: Reva Bores, MD;  Location: [facility];  Service: Gynecology;  Laterality: N/A;   ROBOTIC ASSISTED TOTAL HYSTERECTOMY WITH BILATERAL SALPINGO OOPHERECTOMY N/A 10/27/2020   Procedure: XI ROBOTIC ASSISTED TOTAL HYSTERECTOMY WITH BILATERAL SALPINGO OOPHORECTOMY;  Surgeon: Adolphus Birchwood, MD;  Location: [facility];  Service: Gynecology;  Laterality: N/A;   SENTINEL NODE BIOPSY N/A 10/27/2020   Procedure: SENTINEL NODE BIOPSY;  Surgeon: Adolphus Birchwood, MD;  Location: [facility];  Service: Gynecology;  Laterality: N/A;   Family History  Problem Relation Age of Onset   Anesthesia problems Mother        hard to wake post op    Cerebrovascular Disease Mother        Never had stroke,  but had CEA.   Liver cancer Father        mets to liver    Bladder Cancer Father    Colon cancer Father        mets to colon    Schizophrenia Daughter        Schizoaffective disorder   Anesthesia problems Son        stopped breathing post op    Tremor Son    Anesthesia problems Granddaughter        PONV   High blood pressure Other    Diabetes Other    Arthritis Other    Schizophrenia Child    Tremor Child    Dementia Neg Hx    Esophageal cancer Neg Hx    Rectal cancer Neg Hx    Stomach cancer Neg Hx    Colon polyps Neg Hx    Social History   Socioeconomic History   Marital status: Divorced    Spouse name: Not on file   Number of children: 3   Years of education: 12   Highest education level: Not on file  Occupational History   Occupation: Retired     Comment: nanny   Occupation: retired  Tobacco Use   Smoking status: Never   Smokeless tobacco: Never  Vaping Use   Vaping status: Never Used  Substance and Sexual Activity   Alcohol use: Not Currently    Comment: quit 1978   Drug use: Never   Sexual activity: Not Currently  Other Topics Concern   Not on file  Social  History Narrative    Lives at home. Her granddaughter lives with her and daughter    Right handed   Social Drivers of Health   Financial Resource Strain: Low Risk  (01/28/2024)   Overall Financial Resource Strain (CARDIA)    Difficulty of Paying Living Expenses: Not hard at all  Food Insecurity: No Food Insecurity (01/28/2024)   Hunger Vital Sign    Worried About Running Out of Food in the Last Year: Never true    Ran Out of Food in the Last Year: Never true  Transportation Needs: No Transportation Needs (01/28/2024)   PRAPARE - Administrator, Civil Service (Medical): No    Lack of Transportation (Non-Medical): No  Physical Activity: Not on file  Stress: Stress Concern Present (01/28/2024)   [employer] of Occupational Health - Occupational Stress Questionnaire    Feeling of Stress : To some extent  Social Connections: Moderately Isolated (01/28/2024)   Social Connection and Isolation Panel [NHANES]    Frequency of Communication with Friends and Family: Twice a week    Frequency of Social Gatherings with Friends and Family: More than three times a week    Attends Religious Services: 1 to 4 times per year    Active Member of [employer] or Organizations: No    Attends Engineer, structural: Never    Marital Status: Divorced    Tobacco Counseling Counseling given: Not Answered   Clinical Intake:  Pre-visit preparation completed: Yes  Pain : No/denies pain     BMI - recorded: 33.11 Nutritional Status: BMI > 30  Obese Nutritional Risks: None Diabetes: Yes CBG done?: No Did pt. bring in CBG monitor from home?: No  How often do you need to have someone help you when you read instructions, pamphlets, or other written materials from your doctor or pharmacy?: 1 - Never  Interpreter Needed?: No  Information entered by :: Lanier Ensign,  LPN   Activities of Daily Living    01/28/2024    2:14 PM  In your present state of health, do you have any  difficulty performing the following activities:  Hearing? 0  Vision? 0  Difficulty concentrating or making decisions? 0  Walking or climbing stairs? 0  Dressing or bathing? 0  Doing errands, shopping? 0  Preparing Food and eating ? N  Using the Toilet? N  In the past six months, have you accidently leaked urine? N  Do you have problems with loss of bowel control? N  Managing your Medications? N  Managing your Finances? N  Housekeeping or managing your Housekeeping? N    Patient Care Team: Jeani Sow, MD as PCP - General (Family Medicine) Corky Crafts, MD as PCP - Cardiology (Cardiology) Allwardt, Crist Infante, PA-C (Physician Assistant)  Indicate any recent Medical Services you may have received from other than [facility] providers in the past year (date may be approximate).     Assessment:   This is a routine wellness examination for Kaleia.  Hearing/Vision screen Hearing Screening - Comments:: Pt denies any hearing issues  Vision Screening - Comments:: Pt follows up with eye provider for annual ey exams    Goals Addressed             This Visit's Progress    Patient Stated       Get into better health        Depression Screen    01/28/2024    2:17 PM 06/17/2023   12:56 PM 04/05/2023    1:08 PM 02/12/2023   12:44 PM 10/22/2022    1:49 PM 07/24/2022    1:30 PM 03/05/2022    1:33 PM  PHQ 2/9 Scores  PHQ - 2 Score 2 3 1 2 2  0 1  PHQ- 9 Score 3  6    6     Fall Risk    01/28/2024    2:21 PM 12/09/2023   11:02 AM 06/17/2023   12:56 PM 04/05/2023    1:09 PM 02/12/2023   12:44 PM  Fall Risk   Falls in the past year? 1 1 1 1  0  Number falls in past yr: 1 1 0 0 0  Injury with Fall? 1 0 0 0 0  Comment bruised      Risk for fall due to : Impaired balance/gait;Impaired mobility   No Fall Risks   Follow up Falls prevention discussed Falls evaluation completed  Falls evaluation completed     MEDICARE RISK AT HOME: Medicare Risk at Home Any stairs in or around the  home?: Yes If so, are there any without handrails?: No Home free of loose throw rugs in walkways, pet beds, electrical cords, etc?: Yes Adequate lighting in your home to reduce risk of falls?: Yes Life alert?: No Use of a cane, walker or w/c?: Yes Grab bars in the bathroom?: Yes Shower chair or bench in shower?: No Elevated toilet seat or a handicapped toilet?: No  TIMED UP AND GO:  Was the test performed?  No    Cognitive Function:    09/29/2019   12:17 PM  MMSE - Mini Mental State Exam  Orientation to time 5  Orientation to Place 5  Registration 3  Attention/ Calculation 5  Recall 2  Language- name 2 objects 2  Language- repeat 1  Language- follow 3 step command 3  Language- read & follow direction 1  Write a sentence 1  Copy design  0  Total score 28      08/25/2015   10:36 AM  Montreal Cognitive Assessment   Visuospatial/ Executive (0/5) 3  Naming (0/3) 3  Attention: Read list of digits (0/2) 2  Attention: Read list of letters (0/1) 1  Attention: Serial 7 subtraction starting at 100 (0/3) 1  Language: Repeat phrase (0/2) 1  Language : Fluency (0/1) 1  Abstraction (0/2) 1  Delayed Recall (0/5) 2  Orientation (0/6) 6  Total 21  Adjusted Score (based on education) 22      01/28/2024    2:23 PM  6CIT Screen  What Year? 0 points  What month? 0 points  What time? 0 points  Count back from 20 0 points  Months in reverse 0 points  Repeat phrase 0 points  Total Score 0 points    Immunizations Immunization History  Administered Date(s) Administered   Influenza Split 10/08/2014   Influenza, High Dose Seasonal PF 08/10/2015, 07/27/2016, 12/31/2018, 09/22/2019   PFIZER(Purple Top)SARS-COV-2 Vaccination 06/16/2020, 07/07/2020   Pneumococcal Conjugate-13 07/27/2016   Pneumococcal Polysaccharide-23 10/08/2014   Tdap 12/31/2018, 01/30/2022   Zoster Recombinant(Shingrix) 12/29/2020    TDAP status: Up to date  Flu Vaccine status: Due, Education has been  provided regarding the importance of this vaccine. Advised may receive this vaccine at local pharmacy or Health Dept. Aware to provide a copy of the vaccination record if obtained from local pharmacy or Health Dept. Verbalized acceptance and understanding.  Pneumococcal vaccine status: Declined,  Education has been provided regarding the importance of this vaccine but patient still declined. Advised may receive this vaccine at local pharmacy or Health Dept. Aware to provide a copy of the vaccination record if obtained from local pharmacy or Health Dept. Verbalized acceptance and understanding.   Covid-19 vaccine status: Information provided on how to obtain vaccines.   Qualifies for Shingles Vaccine? Yes   Zostavax completed No   Shingrix Completed?: No.    Education has been provided regarding the importance of this vaccine. Patient has been advised to call insurance company to determine out of pocket expense if they have not yet received this vaccine. Advised may also receive vaccine at local pharmacy or Health Dept. Verbalized acceptance and understanding.  Screening Tests Health Maintenance  Topic Date Due   OPHTHALMOLOGY EXAM  Never done   Diabetic kidney evaluation - Urine ACR  01/24/2024   INFLUENZA VACCINE  03/16/2024 (Originally 07/18/2023)   Zoster Vaccines- Shingrix (2 of 2) 03/16/2024 (Originally 02/23/2021)   COVID-19 Vaccine (3 - Pfizer risk series) 07/16/2024 (Originally 08/04/2020)   FOOT EXAM  06/25/2024   HEMOGLOBIN A1C  07/19/2024   Diabetic kidney evaluation - eGFR measurement  01/19/2025   Medicare Annual Wellness (AWV)  01/27/2025   MAMMOGRAM  09/01/2025   Colonoscopy  01/06/2029   DTaP/Tdap/Td (3 - Td or Tdap) 01/31/2032   DEXA SCAN  Completed   Hepatitis C Screening  Completed   HPV VACCINES  Aged Out   Pneumonia Vaccine 37+ Years old  Discontinued    Health Maintenance  Health Maintenance Due  Topic Date Due   OPHTHALMOLOGY EXAM  Never done   Diabetic kidney  evaluation - Urine ACR  01/24/2024    Colorectal cancer screening: Type of screening: Colonoscopy. Completed 01/07/24. Repeat every 5 years  Mammogram status: Completed 09/02/23. Repeat every year  Bone Density status: Completed 07/30/22. Results reflect: Bone density results: OSTEOPENIA. Repeat every 2 years.   Additional Screening:  Hepatitis C Screening: Completed 05/30/23  Vision  Screening: Recommended annual ophthalmology exams for early detection of glaucoma and other disorders of the eye. Is the patient up to date with their annual eye exam?  Yes  Who is the provider or what is the name of the office in which the patient attends annual eye exams? Unsure of providers name  If pt is not established with a provider, would they like to be referred to a provider to establish care? No .   Dental Screening: Recommended annual dental exams for proper oral hygiene  Diabetic Foot Exam: Diabetic Foot Exam: Completed 06/26/23  Community Resource Referral / Chronic Care Management: CRR required this visit?  No   CCM required this visit?  No     Plan:     I have personally reviewed and noted the following in the patient's chart:   Medical and social history Use of alcohol, tobacco or illicit drugs  Current medications and supplements including opioid prescriptions. Patient is not currently taking opioid prescriptions. Functional ability and status Nutritional status Physical activity Advanced directives List of other physicians Hospitalizations, surgeries, and ER visits in previous 12 months Vitals Screenings to include cognitive, depression, and falls Referrals and appointments  In addition, I have reviewed and discussed with patient certain preventive protocols, quality metrics, and best practice recommendations. A written personalized care plan for preventive services as well as general preventive health recommendations were provided to patient.     Marzella Schlein,  LPN   [id number]/[id number]   After Visit Summary: (MyChart) Due to this being a telephonic visit, the after visit summary with patients personalized plan was offered to patient via MyChart   Nurse Notes: none

## 2024-01-28 NOTE — Patient Instructions (Signed)
[name] , Thank you for taking time to come for your Medicare Wellness Visit. I appreciate your ongoing commitment to your health goals. Please review the following plan we discussed and let me know if I can assist you in the future.   Referrals/Orders/Follow-Ups/Clinician Recommendations: Each day, aim for 6 glasses of water, plenty of protein in your diet and try to get up and walk/ stretch every hour for 5-10 minutes at a time.    This is a list of the screening recommended for you and due dates:  Health Maintenance  Topic Date Due   Eye exam for diabetics  Never done   Yearly kidney health urinalysis for diabetes  01/24/2024   Flu Shot  03/16/2024*   Zoster (Shingles) Vaccine (2 of 2) 03/16/2024*   COVID-19 Vaccine (3 - Pfizer risk series) 07/16/2024*   Complete foot exam   06/25/2024   Hemoglobin A1C  07/19/2024   Yearly kidney function blood test for diabetes  01/19/2025   Medicare Annual Wellness Visit  01/27/2025   Mammogram  09/01/2025   Colon Cancer Screening  01/06/2029   DTaP/Tdap/Td vaccine (3 - Td or Tdap) 01/31/2032   DEXA scan (bone density measurement)  Completed   Hepatitis C Screening  Completed   HPV Vaccine  Aged Out   Pneumonia Vaccine  Discontinued  *Topic was postponed. The date shown is not the original due date.    Advanced directives: (Declined) Advance directive discussed with you today. Even though you declined this today, please call our office should you change your mind, and we can give you the proper paperwork for you to fill out.  Next Medicare Annual Wellness Visit scheduled for next year: Yes

## 2024-01-29 ENCOUNTER — Encounter (HOSPITAL_BASED_OUTPATIENT_CLINIC_OR_DEPARTMENT_OTHER): Payer: 59 | Admitting: Cardiovascular Disease

## 2024-01-29 DIAGNOSIS — I129 Hypertensive chronic kidney disease with stage 1 through stage 4 chronic kidney disease, or unspecified chronic kidney disease: Secondary | ICD-10-CM | POA: Diagnosis not present

## 2024-01-29 DIAGNOSIS — N182 Chronic kidney disease, stage 2 (mild): Secondary | ICD-10-CM | POA: Diagnosis not present

## 2024-01-29 DIAGNOSIS — R609 Edema, unspecified: Secondary | ICD-10-CM | POA: Diagnosis not present

## 2024-01-29 DIAGNOSIS — D631 Anemia in chronic kidney disease: Secondary | ICD-10-CM | POA: Diagnosis not present

## 2024-01-29 DIAGNOSIS — N050 Unspecified nephritic syndrome with minor glomerular abnormality: Secondary | ICD-10-CM | POA: Diagnosis not present

## 2024-01-29 DIAGNOSIS — N2581 Secondary hyperparathyroidism of renal origin: Secondary | ICD-10-CM | POA: Diagnosis not present

## 2024-01-29 NOTE — Progress Notes (Deleted)
 [facility] Initial Assessment:    Date:  01/29/2024   ID:  [name], Abdulaziz 11/07/1951, MRN [id number]  PCP:  Jeani Sow, MD  Cardiologist:  Lance Muss, MD  Nephrologist:  Referring MD: Jeani Sow, MD   CC: Hypertension  History of Present Illness:    [name] is a 73 y.o. female with a hx of non-obstructive CAD, carotid stenosis, diabetes, CKD, hypertension, hyperlipidemia and dementia here for follow up.  She first established care in the [facility] 07/2023.  She saw Dr. Eldridge Dace in 2021 due to three-vessel CAD on imaging.  She has had lower extremity edema that has been managed with Bumex.  Carotid Dopplers 07/2023 revealed 60 to 79% right ICA stenosis and 1 to 39% left ICA stenosis.  She last saw vascular surgery 07/2023.  She was asymptomatic and medical management was recommended.  She remained off aspirin due to history of GI bleeding.  She last saw Jari Favre, PA-C 12/2022.  BP was 150/60 at that time.  She struggled with multiple medication allergies.  She was not on any antihypertensives at that time.  At her visit 07/2023 home blood pressures were ranging in the 120s to 160s.  They typically run higher in the mornings.  She was started on amlodipine.  However when she got home she decided not to take it because she stated that she did not know it was an antihypertensive medication.  She was also referred for sleep study and to healthy weight and wellness.  She followed up with Gillian Shields, NP 09/2023 and blood pressure was averaging 124/70 off the medication so it was not resumed.  She saw Dr. Melburn Popper and BP remained controlled.  He noted that she was eating very salty diet. Previous antihypertensives: Atenolol - GI issues, chills Losartan Spironolactone/HCTZ Eplerenone - increased BP  Past Medical History:  Diagnosis Date   Allergy    Anemia    Arthritis    Asthma    Dianosed years ago-no meds at this  time   Cancer [facility]) 2021   endometrial    Cataract    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 name of MD    Dementia (HCC)    Diabetes mellitus    type 2    Diverticulosis    Dyslipidemia    Dyspnea    with exertion    Dysrhythmia    "skips beats"   Endometrial cancer (HCC) 2021   Family history of adverse reaction to anesthesia    son stopped breathing during surgery , mother slow to wake up    Fibromyalgia 1980's   Generalized anxiety disorder    GERD (gastroesophageal reflux disease)    occ- will use Tums and Prolosec  prn    Heart murmur    Hyperlipemia    Hypertension    not on blood pressure meds due to allergies - last dose of meds- months ago    Hypothyroidism    Hypothyroidism (acquired)    Internal hemorrhoids    Irritable bowel syndrome 1980's   Liver cancer (HCC)    Major depressive disorder    Migraine headaches 1980's   On meds-well controlled   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   Morbid obesity (HCC)    Pernicious anemia    Pneumonia 2009   Several  times over the past several years   PONV (postoperative nausea and vomiting)    was told by neurologist due to calcificaton in brain not to be put to sleep, N/V during Op and Recovery    Recurrent upper respiratory infection (URI)    Sleep apnea    no cpap   Subarachnoid hemorrhage (HCC) 2010   Vaginal polyp    benign per pt    Past Surgical History:  Procedure Laterality Date   BIOPSY  01/07/2024   Procedure: BIOPSY;  Surgeon: Napoleon Form, MD;  Location: [facility];  Service: Gastroenterology;;   CATARACT EXTRACTION Right    CHOLECYSTECTOMY  1983   COLONOSCOPY  2008   DB   COLONOSCOPY WITH PROPOFOL N/A 01/07/2024   Procedure: COLONOSCOPY WITH PROPOFOL;  Surgeon: Napoleon Form, MD;  Location: [facility];  Service: Gastroenterology;  Laterality:  N/A;   DILATION AND CURETTAGE OF UTERUS  1972   ESOPHAGOGASTRODUODENOSCOPY (EGD) WITH PROPOFOL N/A 01/07/2024   Procedure: ESOPHAGOGASTRODUODENOSCOPY (EGD) WITH PROPOFOL;  Surgeon: Napoleon Form, MD;  Location: [facility];  Service: Gastroenterology;  Laterality: N/A;   HYSTEROSCOPY WITH D & C  06/29/2011   Procedure: DILATATION AND CURETTAGE (D&C) /HYSTEROSCOPY;  Surgeon: Reva Bores, MD;  Location: [facility];  Service: Gynecology;  Laterality: N/A;   ROBOTIC ASSISTED TOTAL HYSTERECTOMY WITH BILATERAL SALPINGO OOPHERECTOMY N/A 10/27/2020   Procedure: XI ROBOTIC ASSISTED TOTAL HYSTERECTOMY WITH BILATERAL SALPINGO OOPHORECTOMY;  Surgeon: Adolphus Birchwood, MD;  Location: [facility];  Service: Gynecology;  Laterality: N/A;   SENTINEL NODE BIOPSY N/A 10/27/2020   Procedure: SENTINEL NODE BIOPSY;  Surgeon: Adolphus Birchwood, MD;  Location: [facility];  Service: Gynecology;  Laterality: N/A;    Current Medications: No outpatient medications have been marked as taking for the 01/29/24 encounter (Appointment) with Chilton Si, MD.   Current Facility-Administered Medications for the 01/29/24 encounter (Appointment) with Chilton Si, MD  Medication   cyanocobalamin (VITAMIN B12) injection 1,000 mcg     Allergies:   Atenolol, Atorvastatin, Canagliflozin, Losartan potassium, Acarbose, Cinnamon, Empagliflozin, Glimepiride, Metformin and related, Other, Pioglitazone, Pravastatin, Sitagliptin, Spironolactone-hctz, Statins, Sulfamethoxazole, Tramadol hcl, Atorvastatin calcium, Augmentin [amoxicillin-pot clavulanate], Crestor [rosuvastatin], Rosuvastatin calcium, Torsemide, Bumex [bumetanide], Lasix [furosemide], and Sulfa antibiotics   Social History   Socioeconomic History   Marital status: Divorced    Spouse name: Not on file   Number of children: 3   Years of education: 12   Highest education level: Not on file  Occupational History   Occupation: Retired     Comment: nanny   Occupation: retired   Tobacco Use   Smoking status: Never   Smokeless tobacco: Never  Vaping Use   Vaping status: Never Used  Substance and Sexual Activity   Alcohol use: Not Currently    Comment: quit 1978   Drug use: Never   Sexual activity: Not Currently  Other Topics Concern   Not on file  Social History Narrative    Lives at home. Her granddaughter lives with her and daughter    Right handed   Social Drivers of Health   Financial Resource Strain: Low Risk  (01/28/2024)   Overall Financial Resource Strain (CARDIA)    Difficulty of Paying Living Expenses: Not hard at all  Food Insecurity: No Food Insecurity (01/28/2024)   Hunger Vital Sign    Worried About Running Out of Food in the Last Year: Never true    Ran Out of Food in the Last Year: Never true  Transportation Needs: No  Transportation Needs (01/28/2024)   PRAPARE - Administrator, Civil Service (Medical): No    Lack of Transportation (Non-Medical): No  Physical Activity: Not on file  Stress: Stress Concern Present (01/28/2024)   [employer] of Occupational Health - Occupational Stress Questionnaire    Feeling of Stress : To some extent  Social Connections: Moderately Isolated (01/28/2024)   Social Connection and Isolation Panel [NHANES]    Frequency of Communication with Friends and Family: Twice a week    Frequency of Social Gatherings with Friends and Family: More than three times a week    Attends Religious Services: 1 to 4 times per year    Active Member of [employer] or Organizations: No    Attends Banker Meetings: Never    Marital Status: Divorced     Family History: The patient's family history includes Anesthesia problems in her granddaughter, mother, and son; Arthritis in an other family member; Bladder Cancer in her father; Cerebrovascular Disease in her mother; Colon cancer in her father; Diabetes in an other family member; High blood pressure in an other family member; Liver cancer in her father;  Schizophrenia in her child and daughter; Tremor in her child and son. There is no history of Dementia, Esophageal cancer, Rectal cancer, Stomach cancer, or Colon polyps.  ROS:   Please see the history of present illness.    (+) Fatigue (+) Shortness of breath (+) Nocturnal cough and wheezing (+) Insomnia (+) Stress (+) Depressed mood (+) Snoring (+) Chronic, freezing chills All other systems reviewed and are negative.  EKGs/Labs/Other Studies Reviewed:    Bilateral Carotid Dopplers  07/31/2023: Summary:  Right Carotid: Velocities in the right ICA are consistent with a 60-79% stenosis.   Left Carotid: Velocities in the left ICA are consistent with a 1-39%  stenosis.   Vertebrals: Bilateral vertebral arteries demonstrate antegrade flow.  Subclavians: Normal flow hemodynamics were seen in bilateral subclavian arteries.   Echo w/ Bubble Study  03/12/2023:  1. Left ventricular ejection fraction, by estimation, is 60 to 65%. The  left ventricle has normal function. Left ventricular diastolic parameters  were normal.   2. Right ventricular systolic function is normal.   3. The mitral valve is normal in structure.   4. The aortic valve was not assessed.   5. Positive color doppler with left to right flow.. Agitated saline  contrast bubble study was negative, with no evidence of any interatrial  shunt.   EKG:  EKG is personally reviewed. 08/14/2023:  Not ordered.  Recent Labs: 12/09/2023: TSH 1.13 01/20/2024: ALT 10; BUN 24; Creatinine, Ser 1.51; Hemoglobin 11.6; Magnesium 1.6; Platelets 233.0; Potassium 4.4; Sodium 141   Recent Lipid Panel    Component Value Date/Time   CHOL 115 12/09/2023 1155   CHOL 134 08/16/2021 1018   TRIG 195.0 (H) 12/09/2023 1155   HDL 42.50 12/09/2023 1155   HDL 36 (L) 08/16/2021 1018   CHOLHDL 3 12/09/2023 1155   VLDL 39.0 12/09/2023 1155   LDLCALC 34 12/09/2023 1155   LDLCALC 69 08/16/2021 1018   LDLDIRECT 130 (H) 05/10/2020 1659    Physical  Exam:    VS:  There were no vitals taken for this visit. , BMI There is no height or weight on file to calculate BMI. GENERAL:  Well appearing HEENT: Pupils equal round and reactive, fundi not visualized, oral mucosa unremarkable NECK:  No jugular venous distention, waveform within normal limits, carotid upstroke brisk and symmetric, no bruits, no thyromegaly LUNGS:  Clear to auscultation bilaterally HEART:  RRR.  PMI not displaced or sustained,S1 and S2 within normal limits, no S3, no S4, no clicks, no rubs, no murmurs ABD:  Flat, positive bowel sounds normal in frequency in pitch, no bruits, no rebound, no guarding, no midline pulsatile mass, no hepatomegaly, no splenomegaly EXT:  2 plus pulses throughout, no edema, no cyanosis no clubbing SKIN:  No rashes no nodules NEURO:  Cranial nerves II through XII grossly intact, motor grossly intact throughout PSYCH:  Cognitively intact, oriented to person place and time   ASSESSMENT/PLAN:          Screening for Secondary Hypertension:     08/14/2023    3:01 PM  Causes  Drugs/Herbals Screened     - Comments +frozen meals.  +1-3 cups of coffee.  2 sodas/day.  No EtOH/tob  Renovascular HTN N/A  Thyroid Disease Screened  Hyperaldosteronism N/A  Pheochromocytoma N/A  Cushing's Syndrome N/A  Hyperparathyroidism N/A  Coarctation of the Aorta Screened     - Comments BP symmetric  Compliance Screened    Relevant Labs/Studies:    Latest Ref Rng & Units 01/20/2024    3:16 PM 12/09/2023   11:55 AM 11/04/2023    5:03 PM  Basic Labs  Sodium 135 - 145 mEq/L 141  137  142   Potassium 3.5 - 5.1 mEq/L 4.4  4.5  4.3   Creatinine 0.40 - 1.20 mg/dL 1.61  0.96  0.45        Latest Ref Rng & Units 12/09/2023   11:55 AM 07/02/2023    2:47 PM  Thyroid   TSH 0.35 - 5.50 uIU/mL 1.13  1.32      Disposition:    FU with APP/PharmD in 1 month for the next 3 months. FU with Jayel Inks C. Duke Salvia, MD, [facility] in 4 months.  Medication Adjustments/Labs and  Tests Ordered: Current medicines are reviewed at length with the patient today.  Concerns regarding medicines are outlined above.   No orders of the defined types were placed in this encounter.  No orders of the defined types were placed in this encounter.    Signed, Chilton Si, MD  01/29/2024 1:08 PM    [facility]

## 2024-02-05 ENCOUNTER — Ambulatory Visit (HOSPITAL_COMMUNITY)
Admission: RE | Admit: 2024-02-05 | Discharge: 2024-02-05 | Disposition: A | Discharge status: Home / Self Care | Payer: 59 | Source: Ambulatory Visit | Attending: Vascular Surgery | Admitting: Vascular Surgery

## 2024-02-05 ENCOUNTER — Ambulatory Visit (HOSPITAL_COMMUNITY): Payer: 59

## 2024-02-05 ENCOUNTER — Ambulatory Visit (INDEPENDENT_AMBULATORY_CARE_PROVIDER_SITE_OTHER): Payer: 59 | Admitting: Physician Assistant

## 2024-02-05 ENCOUNTER — Ambulatory Visit: Payer: 59

## 2024-02-05 VITALS — BP 182/86 | HR 89 | Temp 97.1°F | Ht 62.0 in | Wt 198.2 lb

## 2024-02-05 DIAGNOSIS — I6523 Occlusion and stenosis of bilateral carotid arteries: Secondary | ICD-10-CM

## 2024-02-05 NOTE — Progress Notes (Signed)
Office Note     CC:  follow up Requesting Provider:  Jeani Sow, MD  HPI: [name] is a 73 y.o. ([birth year]) female who presents for routine follow up of carotid artery stenosis. She has no history of TIA or stroke. She has had some vague neurological symptoms. We have been following her right ICA stenosis of 40-59% and left ICA stenosis of 1-39% since 2021. She has been followed by Neurology over the years.   She denies any amaurosis. She says she has had recent cataract surgery and has Macular degeneration and feels her left eye vision is worsening over past 6 months - 1 year. No other visual changes, no slurred speech, facial drooping or unilateral upper or lower extremity weakness or numbness. She does feel that her memory is getting much worse. She also explains that she recently underwent colonoscopy and endoscopy for chronic diarrhea and bleeding and she is asking if this is related to her medications. She says she has many allergies.   The pt is on a statin for cholesterol management. (Repatha) The pt is not on a daily aspirin.   Other AC:  none The pt is not on medication for hypertension.   The pt is not on medication for diabetes Tobacco hx:  never   Past Medical History:  Diagnosis Date   Allergy    Anemia    Arthritis    Asthma    Dianosed years ago-no meds at this time   Cancer [facility]) 2021   endometrial    Cataract    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 name of MD    Dementia (HCC)    Diabetes mellitus    type 2    Diverticulosis    Dyslipidemia    Dyspnea    with exertion    Dysrhythmia    "skips beats"   Endometrial cancer (HCC) 2021   Family history of adverse reaction to anesthesia    son stopped breathing during surgery , mother slow to wake up    Fibromyalgia 1980's   Generalized anxiety disorder    GERD  (gastroesophageal reflux disease)    occ- will use Tums and Prolosec  prn    Heart murmur    Hyperlipemia    Hypertension    not on blood pressure meds due to allergies - last dose of meds- months ago    Hypothyroidism    Hypothyroidism (acquired)    Internal hemorrhoids    Irritable bowel syndrome 1980's   Liver cancer (HCC)    Major depressive disorder    Migraine headaches 1980's   On meds-well controlled   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   Morbid obesity (HCC)    Pernicious anemia    Pneumonia 2009   Several times over the past several years   PONV (postoperative nausea and vomiting)    was told by neurologist due to calcificaton in brain not to be put to sleep, N/V during Op and Recovery    Recurrent upper respiratory infection (URI)    Sleep apnea    no cpap   Subarachnoid hemorrhage (HCC) 2010   Vaginal polyp    benign per pt    Past Surgical History:  Procedure Laterality Date   BIOPSY  01/07/2024   Procedure: BIOPSY;  Surgeon: Napoleon Form, MD;  Location: [facility]  END[facility]COPY;  Service: Gastroenterology;;   CATARACT EXTRACTION Right    CHOLECYSTECTOMY  1983   COLONOSCOPY  2008   DB   COLONOSCOPY WITH PROPOFOL N/A 01/07/2024   Procedure: COLONOSCOPY WITH PROPOFOL;  Surgeon: Napoleon Form, MD;  Location: [facility] ENDOSCOPY;  Service: Gastroenterology;  Laterality: N/A;   DILATION AND CURETTAGE OF UTERUS  1972   ESOPHAGOGASTRODUODENOSCOPY (EGD) WITH PROPOFOL N/A 01/07/2024   Procedure: ESOPHAGOGASTRODUODENOSCOPY (EGD) WITH PROPOFOL;  Surgeon: Napoleon Form, MD;  Location: [facility] ENDOSCOPY;  Service: Gastroenterology;  Laterality: N/A;   HYSTEROSCOPY WITH D & C  06/29/2011   Procedure: DILATATION AND CURETTAGE (D&C) /HYSTEROSCOPY;  Surgeon: Reva Bores, MD;  Location: [facility] ORS;  Service: Gynecology;  Laterality: N/A;   ROBOTIC ASSISTED TOTAL HYSTERECTOMY WITH BILATERAL SALPINGO OOPHERECTOMY N/A 10/27/2020   Procedure: XI ROBOTIC ASSISTED TOTAL HYSTERECTOMY WITH  BILATERAL SALPINGO OOPHORECTOMY;  Surgeon: Adolphus Birchwood, MD;  Location: [facility] ORS;  Service: Gynecology;  Laterality: N/A;   SENTINEL NODE BIOPSY N/A 10/27/2020   Procedure: SENTINEL NODE BIOPSY;  Surgeon: Adolphus Birchwood, MD;  Location: [facility] OR[facility];  Service: Gynecology;  Laterality: N/A;    Social History   Socioeconomic History   Marital status: Divorced    Spouse name: Not on file   Number of children: 3   Years of education: 12   Highest education level: Not on file  Occupational History   Occupation: Retired     Comment: nanny   Occupation: retired  Tobacco Use   Smoking status: Never   Smokeless tobacco: Never  Vaping Use   Vaping status: Never Used  Substance and Sexual Activity   Alcohol use: Not Currently    Comment: quit 1978   Drug use: Never   Sexual activity: Not Currently  Other Topics Concern   Not on file  Social History Narrative    Lives at home. Her granddaughter lives with her and daughter    Right handed   Social Drivers of Health   Financial Resource Strain: Low Risk  (01/28/2024)   Overall Financial Resource Strain (CARDIA)    Difficulty of Paying Living Expenses: Not hard at all  Food Insecurity: No Food Insecurity (01/28/2024)   Hunger Vital Sign    Worried About Running Out of Food in the Last Year: Never true    Ran Out of Food in the Last Year: Never true  Transportation Needs: No Transportation Needs (01/28/2024)   PRAPARE - Administrator, Civil Service (Medical): No    Lack of Transportation (Non-Medical): No  Physical Activity: Not on file  Stress: Stress Concern Present (01/28/2024)   [employer] of Occupational Health - Occupational Stress Questionnaire    Feeling of Stress : To some extent  Social Connections: Moderately Isolated (01/28/2024)   Social Connection and Isolation Panel [NHANES]    Frequency of Communication with Friends and Family: Twice a week    Frequency of Social Gatherings with Friends and Family: More than  three times a week    Attends Religious Services: 1 to 4 times per year    Active Member of [employer] or Organizations: No    Attends Banker Meetings: Never    Marital Status: Divorced  Catering manager Violence: Not At Risk (01/28/2024)   Humiliation, Afraid, Rape, and Kick questionnaire    Fear of Current or Ex-Partner: No    Emotionally Abused: No    Physically Abused: No    Sexually Abused: No    Family History  Problem  Relation Age of Onset   Anesthesia problems Mother        hard to wake post op    Cerebrovascular Disease Mother        Never had stroke, but had CEA.   Liver cancer Father        mets to liver    Bladder Cancer Father    Colon cancer Father        mets to colon    Schizophrenia Daughter        Schizoaffective disorder   Anesthesia problems Son        stopped breathing post op    Tremor Son    Anesthesia problems Granddaughter        PONV   High blood pressure Other    Diabetes Other    Arthritis Other    Schizophrenia Child    Tremor Child    Dementia Neg Hx    Esophageal cancer Neg Hx    Rectal cancer Neg Hx    Stomach cancer Neg Hx    Colon polyps Neg Hx     Current Outpatient Medications  Medication Sig Dispense Refill   aspirin EC 325 MG tablet Take 325 mg by mouth daily.     Cholecalciferol (VITAMIN D3) 25 MCG (1000 UT) capsule Take 1,000 Units by mouth daily.     escitalopram (LEXAPRO) 10 MG tablet Take 0.5 tablets (5 mg total) by mouth daily. (Patient not taking: Reported on 01/28/2024) 30 tablet 1   estradiol (ESTRING) 7.5 MCG/24HR vaginal ring Place 1 each vaginally every 3 (three) months. 1 each 3   glucose blood (ACCU-CHEK GUIDE) test strip Use to test blood sugar 3 times daily (Patient taking differently: 1 each as needed. Use to test blood sugar 3 times daily) 100 each 2   levothyroxine (SYNTHROID) 50 MCG tablet TAKE 1 TABLET BY MOUTH EVERY DAY BEFORE BREAKFAST 90 tablet 3   MAGNESIUM CITRATE PO Take 1 tablet by mouth 2  (two) times daily.     Multiple Vitamin (MULTIVITAMIN WITH MINERALS) TABS tablet Take 1 tablet by mouth daily. 30 tablet 0   pantoprazole (PROTONIX) 40 MG tablet Take 1 tablet (40 mg total) by mouth 2 (two) times daily before a meal. 180 tablet 3   potassium chloride SA (KLOR-CON M) 20 MEQ tablet Take 1 tablet (20 mEq total) by mouth 2 (two) times daily. 180 tablet 1   pregabalin (LYRICA) 50 MG capsule Take 50 mg by mouth as needed ((for pain).). (Patient not taking: Reported on 01/28/2024)     REPATHA SURECLICK 140 MG/ML SOAJ INJECT 1 PEN SUBCUTANEOUSLY EVERY 14 DAYS 6 mL 3   SYSTANE HYDRATION PF 0.4-0.3 % SOLN Place 1-2 drops into both eyes in the morning.     torsemide (DEMADEX) 20 MG tablet TAKE 1 TABLET BY MOUTH ONCE DAILY 90 tablet 1   Current Facility-Administered Medications  Medication Dose Route Frequency Provider Last Rate Last Admin   cyanocobalamin (VITAMIN B12) injection 1,000 mcg  1,000 mcg Intramuscular Q30 days Jeani Sow, MD   1,000 mcg at 01/20/24 1526    Allergies  Allergen Reactions   Atenolol Other (See Comments)    Stomach problems, chills   Atorvastatin Other (See Comments)    Retained fluid   Canagliflozin Other (See Comments)    ALL DIABETIC MEDS PER PT   abdominal pain, headache   Losartan Potassium Other (See Comments)    chills, HA, stomach pain   Acarbose Nausea Only and Other (  See Comments)     headache,nausea   Cinnamon Other (See Comments)    Stomach pain   Empagliflozin Other (See Comments)     headaches   Glimepiride Other (See Comments)    ALL DIABETIC MEDS PER PT  abdominal pain, headache   Metformin And Related Nausea And Vomiting and Other (See Comments)    Patient states that she has severe chills, headache and cramping additionally. Says blood sugar is uncontrolled because of this   Other Other (See Comments)    ALL DIABETIC MEDS PER PT    Pioglitazone Other (See Comments)    ALL DIABETIC MEDS PER PT = peripheral edema    Pravastatin Other (See Comments)    Retained fluid   Sitagliptin Other (See Comments)    ALL DIABETIC MEDS PER PT  Other reaction(s): headache   Spironolactone-Hctz Other (See Comments)   Statins Other (See Comments)    Myalgia   Sulfamethoxazole Other (See Comments)    ALL DIABETIC MEDS PER PT    Tramadol Hcl Itching   Atorvastatin Calcium Other (See Comments)     retain fld   Augmentin [Amoxicillin-Pot Clavulanate] Other (See Comments)    Caused jaundice   Crestor [Rosuvastatin] Other (See Comments)   Rosuvastatin Calcium    Torsemide Nausea Only    Gi upset, chills, abd pain,bloating   Bumex [Bumetanide] Rash and Other (See Comments)    Chills, rash, headaches, stomach issues also   Lasix [Furosemide] Rash and Other (See Comments)    Sugars increased   Sulfa Antibiotics Rash and Other (See Comments)    Mother has told patient in the past not to take, she cannot recall there reaction     REVIEW OF SYSTEMS:  [X]  denotes positive finding, [ ]  denotes negative finding Cardiac  Comments:  Chest pain or chest pressure:    Shortness of breath upon exertion:    Short of breath when lying flat:    Irregular heart rhythm:        Vascular    Pain in calf, thigh, or hip brought on by ambulation:    Pain in feet at night that wakes you up from your sleep:     Blood clot in your veins:    Leg swelling:         Pulmonary    Oxygen at home:    Productive cough:     Wheezing:         Neurologic    Sudden weakness in arms or legs:     Sudden numbness in arms or legs:     Sudden onset of difficulty speaking or slurred speech:    Temporary loss of vision in one eye:     Problems with dizziness:         Gastrointestinal    Blood in stool:     Vomited blood:         Genitourinary    Burning when urinating:     Blood in urine:        Psychiatric    Major depression:         Hematologic    Bleeding problems:    Problems with blood clotting too easily:        Skin     Rashes or ulcers:        Constitutional    Fever or chills:      PHYSICAL EXAMINATION:  Vitals:   02/05/24 0934  BP: (!) 182/86  Pulse: 89  Temp: Marland Kitchen)  97.1 F (36.2 C)  SpO2: 99%  Weight: 198 lb 3.2 oz (89.9 kg)  Height: 5\' 2"  (1.575 m)    General:  WDWN in NAD; vital signs documented above Gait: uses cane HENT: WNL, normocephalic Pulmonary: normal non-labored breathing , without wheezing Cardiac: regular HR Abdomen: soft Vascular Exam/Pulses: 2+ radial, 2+ DP pulses bilaterally Extremities: without ischemic changes, without Gangrene , without cellulitis; without open wounds;  Musculoskeletal: no muscle wasting or atrophy  Neurologic: A&O X 3 Psychiatric:  The pt has Normal affect.   Non-Invasive Vascular Imaging:   VAS Carotid Duplex: Summary:  Right Carotid: Velocities in the right ICA are consistent with a 60-79% stenosis.   Left Carotid: Velocities in the left ICA are consistent with a 1-39% stenosis.   Vertebrals: Bilateral vertebral arteries demonstrate antegrade flow.  Subclavians: Normal flow hemodynamics were seen in bilateral subclavian arteries.    ASSESSMENT/PLAN:: 73 y.o. female here for follow up for carotid artery stenosis. She has no associated symptoms at this time. Her duplex today is unchanged from her prior duplex 6 months ago. Right ICA 60-79%. Left ICA 1-39% stenosis. Normal flow in the vertebral and subclavian arteries - We discussed no need for surgical intervention unless she became symptomatic or her stenosis progressed to > 80%. Surgical intervention would be for stroke risk reduction - Advised her to follow up with PCP or Gastroenterologist regarding her Endo/Colonoscopy findings - I reviewed the signs and symptoms of stroke, and she understands to go to the ER or call 911 if she experiences any of these symptoms  - Continue Aspirin and Repatha - She will follow up again in 6 months with carotid duplex   Graceann Congress, PA-C [facility] [phone]  Clinic MD:   Randie Heinz

## 2024-02-06 ENCOUNTER — Telehealth: Payer: Self-pay | Admitting: Nurse Practitioner

## 2024-02-06 NOTE — Telephone Encounter (Signed)
 Rescheduled appointments per patient request. Patient is aware of the changes made.

## 2024-02-10 ENCOUNTER — Encounter: Payer: Self-pay | Admitting: Gastroenterology

## 2024-02-10 ENCOUNTER — Ambulatory Visit (INDEPENDENT_AMBULATORY_CARE_PROVIDER_SITE_OTHER): Payer: 59 | Admitting: Gastroenterology

## 2024-02-10 ENCOUNTER — Telehealth: Payer: Self-pay | Admitting: *Deleted

## 2024-02-10 VITALS — BP 134/66 | HR 82 | Ht 62.0 in | Wt 187.2 lb

## 2024-02-10 DIAGNOSIS — K580 Irritable bowel syndrome with diarrhea: Secondary | ICD-10-CM

## 2024-02-10 DIAGNOSIS — K529 Noninfective gastroenteritis and colitis, unspecified: Secondary | ICD-10-CM | POA: Diagnosis not present

## 2024-02-10 DIAGNOSIS — K6389 Other specified diseases of intestine: Secondary | ICD-10-CM | POA: Diagnosis not present

## 2024-02-10 DIAGNOSIS — K2101 Gastro-esophageal reflux disease with esophagitis, with bleeding: Secondary | ICD-10-CM

## 2024-02-10 DIAGNOSIS — K279 Peptic ulcer, site unspecified, unspecified as acute or chronic, without hemorrhage or perforation: Secondary | ICD-10-CM | POA: Diagnosis not present

## 2024-02-10 MED ORDER — ZENPEP 40000-126000 UNITS PO CPEP: ORAL_CAPSULE | ORAL | Status: DC

## 2024-02-10 MED ORDER — ZENPEP 40000-126000 UNITS PO CPEP: ORAL_CAPSULE | ORAL | 2 refills | Status: DC

## 2024-02-10 NOTE — Telephone Encounter (Signed)
 Copied from CRM [id number]. Topic: Clinical - Prescription Issue >> Feb 10, 2024  1:24 PM [place] A wrote: Reason for CRM: Patient called to speak with Dr.Kulick's nurse regarding her REPATHA SURECLICK 140 MG/ML SOAJ; patients states that her electric went off and she had to throw other two bottles away. Would like a call back from nurse please.  Left message for patient to return my call.

## 2024-02-10 NOTE — Progress Notes (Signed)
 [name]    [id number]    January 09, 1951  Primary Care Physician:Kulik, Maryruth Hancock, MD  Referring Physician: Jeani Sow, MD [address] [place],  [place] [address]   Chief complaint: Diarrhea, generalized abdominal pain  Discussed the use of AI scribe software for clinical note transcription with the patient, who gave verbal consent to proceed.  History of Present Illness   [name] is a 73 year old female who presents with diarrhea and gastrointestinal concerns.  She has been experiencing diarrhea, which has become less frequent since undergoing a colonoscopy and endoscopy. She feels 'totally cleaned out' after these procedures. She has tried various treatments, including Creon, which she did not tolerate well, and is unsure if she has tried Zenpep. Stress is noted as a potential contributing factor, as she describes herself as 'extremely stressed.'  She has a history of ulcers, identified as stress ulcers or possibly related to high-dose aspirin use or acid reflux, which have caused bleeding. She is concerned about the impact of high-dose aspirin on her gastrointestinal health and is considering switching to a baby aspirin, which was originally prescribed by her family doctor, Dr. Clarene Duke.  She mentions having black areas in her colon, which she attributes to medication staining, as she does not consume tea or other substances that could cause this. 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  Her diet includes oatmeal with Splenda and diet drinks, which she acknowledges may contribute to her symptoms. She drinks water regularly and is considering dietary changes to improve her health.  She has been experiencing increased shaking and is considering physical therapy to address this. She plans to discuss this with her primary doctor in about two weeks. She has tried three different antidepressants in the past few months,  which have affected her eyesight and caused her to cry frequently, leading to her family's frustration with her emotional state.      EGD January 07, 2024 - Esophageal ulcers with no bleeding and no stigmata of recent bleeding.  - 2 cm hiatal hernia. - Gastritis. Biopsied.  - Erythematous duodenopathy.  Colonoscopy January 07, 2024 - Normal mucosa in the entire examined colon. Biopsied. - Moderate diverticulosis in the sigmoid colon, in the descending colon, in the transverse colon and in the ascending colon. - Non- bleeding external and internal hemorrhoids.  A. GASTRIC ANTRUM AND BODY, BIOPSY:  Reactive gastropathy and minimal chronic gastritis with lymphoid  aggregate  Negative for H. pylori, intestinal metaplasia, dysplasia and carcinoma   B. RIGHT COLON, BIOPSY:  Melanosis coli   C. LEFT COLON, BIOPSY:  Melanosis coli   MRI abdomen/MRCP with and without contrast March 27, 2023 1. Common bile duct is dilated up to 12 mm, not substantially changed dating back to 12/04/2021 and tapering to the level of the ampulla. No definite filling defect or focal mass lesion. 2. Mild splenomegaly, decreased from 15.2 cm on 03/26/2023. 3. Colonic diverticulosis without acute diverticulitis. Outpatient Encounter Medications as of 02/10/2024  Medication Sig   aspirin EC 325 MG tablet Take 325 mg by mouth daily.   Cholecalciferol (VITAMIN D3) 25 MCG (1000 UT) capsule Take 1,000 Units by mouth daily.   estradiol (ESTRING) 7.5 MCG/24HR vaginal ring Place 1 each vaginally every 3 (three) months.   glucose blood (ACCU-CHEK GUIDE) test strip Use to test blood sugar 3 times daily (Patient taking differently: 1 each as needed. Use to test  blood sugar 3 times daily)   levothyroxine (SYNTHROID) 50 MCG tablet TAKE 1 TABLET BY MOUTH EVERY DAY BEFORE BREAKFAST   Multiple Vitamin (MULTIVITAMIN WITH MINERALS) TABS tablet Take 1 tablet by mouth daily.   Pancrelipase, Lip-Prot-Amyl, (ZENPEP) 40000-126000 units  CPEP Take 2 capsules by mouth with meals and 1 capsule with snacks.   Pancrelipase, Lip-Prot-Amyl, (ZENPEP) 40000-126000 units CPEP Take 2 capsules by mouth with meals and one capsule with snacks   pantoprazole (PROTONIX) 40 MG tablet Take 1 tablet (40 mg total) by mouth 2 (two) times daily before a meal.   potassium chloride SA (KLOR-CON M) 20 MEQ tablet Take 1 tablet (20 mEq total) by mouth 2 (two) times daily.   torsemide (DEMADEX) 20 MG tablet TAKE 1 TABLET BY MOUTH ONCE DAILY (Patient taking differently: Take 20 mg by mouth every other day.)   escitalopram (LEXAPRO) 10 MG tablet Take 0.5 tablets (5 mg total) by mouth daily. (Patient not taking: Reported on 02/10/2024)   MAGNESIUM CITRATE PO Take 1 tablet by mouth 2 (two) times daily. (Patient not taking: Reported on 02/10/2024)   pregabalin (LYRICA) 50 MG capsule Take 50 mg by mouth as needed ((for pain).). (Patient not taking: Reported on 02/10/2024)   REPATHA SURECLICK 140 MG/ML SOAJ INJECT 1 PEN SUBCUTANEOUSLY EVERY 14 DAYS (Patient not taking: Reported on 02/10/2024)   SYSTANE HYDRATION PF 0.4-0.3 % SOLN Place 1-2 drops into both eyes in the morning. (Patient not taking: Reported on 02/10/2024)   Facility-Administered Encounter Medications as of 02/10/2024  Medication   cyanocobalamin (VITAMIN B12) injection 1,000 mcg    Allergies as of 02/10/2024 - Review Complete 02/10/2024  Allergen Reaction Noted   Atenolol Other (See Comments) 09/29/2019   Atorvastatin Other (See Comments) 05/20/2018   Canagliflozin Other (See Comments) 05/20/2018   Losartan potassium Other (See Comments) 05/20/2018   Acarbose Nausea Only and Other (See Comments) 05/20/2018   Cinnamon Other (See Comments) 06/15/2015   Empagliflozin Other (See Comments) 11/28/2021   Glimepiride Other (See Comments) 05/20/2018   Metformin and related Nausea And Vomiting and Other (See Comments) 06/26/2011   Other Other (See Comments) 04/15/2020   Pioglitazone Other (See Comments)  04/15/2020   Pravastatin Other (See Comments) 05/20/2018   Sitagliptin Other (See Comments) 05/20/2018   Spironolactone-hctz Other (See Comments) 11/28/2021   Statins Other (See Comments) 10/14/2020   Sulfamethoxazole Other (See Comments) 03/24/2007   Tramadol hcl Itching 10/29/2020   Atorvastatin calcium Other (See Comments) 11/28/2021   Augmentin [amoxicillin-pot clavulanate] Other (See Comments) 03/27/2023   Crestor [rosuvastatin] Other (See Comments) 11/28/2021   Rosuvastatin calcium  03/14/2022   Torsemide Nausea Only 07/02/2023   Bumex [bumetanide] Rash and Other (See Comments) 05/04/2022   Lasix [furosemide] Rash and Other (See Comments) 07/24/2022   Sulfa antibiotics Rash and Other (See Comments) 06/16/2011    Past Medical History:  Diagnosis Date   Allergy    Anemia    Arthritis    Asthma    Dianosed years ago-no meds at this time   Cancer [facility]) 2021   endometrial    Cataract    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 name of MD    Dementia (HCC)    Diabetes mellitus    type 2    Diverticulosis    Dyslipidemia    Dyspnea    with exertion  Dysrhythmia    "skips beats"   Endometrial cancer (HCC) 2021   Family history of adverse reaction to anesthesia    son stopped breathing during surgery , mother slow to wake up    Fibromyalgia 1980's   Generalized anxiety disorder    GERD (gastroesophageal reflux disease)    occ- will use Tums and Prolosec  prn    Heart murmur    Hyperlipemia    Hypertension    not on blood pressure meds due to allergies - last dose of meds- months ago    Hypothyroidism    Hypothyroidism (acquired)    Internal hemorrhoids    Irritable bowel syndrome 1980's   Liver cancer (HCC)    Major depressive disorder    Migraine headaches 1980's   On meds-well controlled   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    Morbid obesity (HCC)    Pernicious anemia    Pneumonia 2009   Several times over the past several years   PONV (postoperative nausea and vomiting)    was told by neurologist due to calcificaton in brain not to be put to sleep, N/V during Op and Recovery    Recurrent upper respiratory infection (URI)    Sleep apnea    no cpap   Subarachnoid hemorrhage (HCC) 2010   Vaginal polyp    benign per pt    Past Surgical History:  Procedure Laterality Date   BIOPSY  01/07/2024   Procedure: BIOPSY;  Surgeon: Napoleon Form, MD;  Location: [facility];  Service: Gastroenterology;;   CATARACT EXTRACTION Right    CHOLECYSTECTOMY  1983   COLONOSCOPY  2008   DB   COLONOSCOPY WITH PROPOFOL N/A 01/07/2024   Procedure: COLONOSCOPY WITH PROPOFOL;  Surgeon: Napoleon Form, MD;  Location: [facility];  Service: Gastroenterology;  Laterality: N/A;   DILATION AND CURETTAGE OF UTERUS  1972   ESOPHAGOGASTRODUODENOSCOPY (EGD) WITH PROPOFOL N/A 01/07/2024   Procedure: ESOPHAGOGASTRODUODENOSCOPY (EGD) WITH PROPOFOL;  Surgeon: Napoleon Form, MD;  Location: [facility];  Service: Gastroenterology;  Laterality: N/A;   HYSTEROSCOPY WITH D & C  06/29/2011   Procedure: DILATATION AND CURETTAGE (D&C) /HYSTEROSCOPY;  Surgeon: Reva Bores, MD;  Location: [facility];  Service: Gynecology;  Laterality: N/A;   ROBOTIC ASSISTED TOTAL HYSTERECTOMY WITH BILATERAL SALPINGO OOPHERECTOMY N/A 10/27/2020   Procedure: XI ROBOTIC ASSISTED TOTAL HYSTERECTOMY WITH BILATERAL SALPINGO OOPHORECTOMY;  Surgeon: Adolphus Birchwood, MD;  Location: [facility];  Service: Gynecology;  Laterality: N/A;   SENTINEL NODE BIOPSY N/A 10/27/2020   Procedure: SENTINEL NODE BIOPSY;  Surgeon: Adolphus Birchwood, MD;  Location: [facility];  Service: Gynecology;  Laterality: N/A;    Family History  Problem Relation Age of Onset   Anesthesia problems Mother        hard to wake post op    Cerebrovascular Disease Mother        Never had stroke, but had CEA.    Liver cancer Father        mets to liver    Bladder Cancer Father    Colon cancer Father        mets to colon    Schizophrenia Daughter        Schizoaffective disorder   Anesthesia problems Son        stopped breathing post op    Tremor Son    Anesthesia problems Granddaughter        PONV   High blood pressure Other    Diabetes Other  Arthritis Other    Schizophrenia Child    Tremor Child    Dementia Neg Hx    Esophageal cancer Neg Hx    Rectal cancer Neg Hx    Stomach cancer Neg Hx    Colon polyps Neg Hx     Social History   Socioeconomic History   Marital status: Divorced    Spouse name: Not on file   Number of children: 3   Years of education: 12   Highest education level: Not on file  Occupational History   Occupation: Retired     Comment: nanny   Occupation: retired  Tobacco Use   Smoking status: Never   Smokeless tobacco: Never  Vaping Use   Vaping status: Never Used  Substance and Sexual Activity   Alcohol use: Not Currently    Comment: quit 1978   Drug use: Never   Sexual activity: Not Currently  Other Topics Concern   Not on file  Social History Narrative    Lives at home. Her granddaughter lives with her and daughter    Right handed   Social Drivers of Health   Financial Resource Strain: Low Risk  (01/28/2024)   Overall Financial Resource Strain (CARDIA)    Difficulty of Paying Living Expenses: Not hard at all  Food Insecurity: No Food Insecurity (01/28/2024)   Hunger Vital Sign    Worried About Running Out of Food in the Last Year: Never true    Ran Out of Food in the Last Year: Never true  Transportation Needs: No Transportation Needs (01/28/2024)   PRAPARE - Administrator, Civil Service (Medical): No    Lack of Transportation (Non-Medical): No  Physical Activity: Not on file  Stress: Stress Concern Present (01/28/2024)   [employer] of Occupational Health - Occupational Stress Questionnaire    Feeling of Stress : To  some extent  Social Connections: Moderately Isolated (01/28/2024)   Social Connection and Isolation Panel [NHANES]    Frequency of Communication with Friends and Family: Twice a week    Frequency of Social Gatherings with Friends and Family: More than three times a week    Attends Religious Services: 1 to 4 times per year    Active Member of [employer] or Organizations: No    Attends Banker Meetings: Never    Marital Status: Divorced  Catering manager Violence: Not At Risk (01/28/2024)   Humiliation, Afraid, Rape, and Kick questionnaire    Fear of Current or Ex-Partner: No    Emotionally Abused: No    Physically Abused: No    Sexually Abused: No      Review of systems: All other review of systems negative except as mentioned in the HPI.   Physical Exam: Vitals:   02/10/24 1420  BP: (!) 142/88  Pulse: 82   Body mass index is 34.24 kg/m. Gen:      No acute distress HEENT:  sclera anicteric CV: s1s2 rrr, no murmur Lungs: B/l clear. Abd:      soft, non-tender; no palpable masses, no distension Ext:    No edema Neuro: alert and oriented x 3 Psych: normal mood and affect  Data Reviewed:  Reviewed labs, radiology imaging, old records and pertinent past GI work up     Assessment and Plan    Chronic Diarrhea   Chronic diarrhea with recent improvement post-colonoscopy and endoscopy. No evidence of colitis on biopsy. Possible pancreatic insufficiency suspected due to insufficient digestive enzymes. Stress and diet (diet drinks)  may also contribute to symptoms. Discussed Zenpep as a digestive enzyme supplement, unlikely to cause allergic reactions. Advised Zenpep 40000 international unit  lipase, should be taken one pill with snacks and two with meals, showing improvement within a few days. If effective, a prescription can be filled.   - Provide Zenpep samples: one pill with snacks, two with meals   - Evaluate response to Zenpep over 3-4 days   - If diarrhea recurs  after stopping samples, fill prescription for Zenpep   - Advise stopping diet drinks, soda, and artificial sweeteners as they may contribute to diarrhea    Stress Ulcers   Stress ulcers likely exacerbated by high-dose aspirin and acid reflux, causing gastrointestinal bleeding.    - Discuss with primary care doctor about switching from adult aspirin (325 mg) to baby aspirin (81 mg)    Melanosis Coli   Benign condition characterized by black areas in the colon, likely due to medication staining. Not related to allergies.   - No specific treatment required    General Health Maintenance   Advised increasing intake of fruits, vegetables, whole grains, and fiber (25-35 grams/day). Recommended drinking only water and avoiding artificial sweeteners, diet drinks, energy drinks, and fruit juices. Suggested adding nuts or flax seeds to oatmeal and using a small amount of honey instead of artificial sweeteners.   - Recommend physical therapy; request referral from primary care doctor   - Advise increasing intake of fruits, vegetables, whole grains, and fiber (25-35 grams/day)    Follow-up   - Schedule follow-up appointment  in 2-3 months to assess progress.      This visit required >40 minutes of patient care (this includes precharting, chart review, review of results, face-to-face time used for counseling as well as treatment plan and follow-up. The patient was provided an opportunity to ask questions and all were answered. The patient agreed with the plan and demonstrated an understanding of the instructions.  Iona Beard , MD    CC: Jeani Sow, MD

## 2024-02-10 NOTE — Patient Instructions (Addendum)
 VISIT SUMMARY:  Today, we discussed your recent gastrointestinal concerns, including chronic diarrhea, stress ulcers, and melanosis coli. We reviewed your current symptoms, recent procedures, and potential contributing factors such as diet and stress. We also talked about possible treatments and lifestyle changes to help manage your conditions.  YOUR PLAN:  -CHRONIC DIARRHEA: Chronic diarrhea is a condition where you have frequent, loose, or watery bowel movements. We suspect it may be due to insufficient digestive enzymes and stress. We recommend trying Zenpep, a digestive enzyme supplement, by taking one pill with snacks and two with meals. You should see improvement within a few days. If it helps, we can provide a prescription. Also, please stop drinking diet drinks as they may contribute to your symptoms.  -STRESS ULCERS: Stress ulcers are sores in the stomach lining that can cause bleeding, often exacerbated by stress and certain medications like high-dose aspirin. We discussed switching from adult aspirin (325 mg) to baby aspirin (81 mg) with your primary care doctor to reduce the risk of further gastrointestinal issues.  -MELANOSIS COLI: Melanosis coli is a benign condition where the colon appears dark due to medication staining. It is not harmful and does not require treatment.  -GENERAL HEALTH MAINTENANCE: For overall health, we recommend increasing your intake of fruits, vegetables, whole grains, and fiber (25-35 grams/day). Drink only water and avoid diet drinks, energy drinks, and fruit juices. Consider adding nuts or flax seeds to your oatmeal and using a small amount of honey instead of artificial sweeteners. We also suggest physical therapy to help with your increased shaking; please request a referral from your primary care doctor.  INSTRUCTIONS:  Please evaluate your response to Zenpep over the next 3-4 days. If your diarrhea recurs after stopping the samples, we will fill a  prescription for Zenpep. Discuss switching from adult aspirin to baby aspirin with your primary care doctor. Schedule a follow-up appointment with your gastroenterologist in two months to assess your progress.   I appreciate the opportunity to care for you. Marsa Aris, MD

## 2024-02-11 NOTE — Telephone Encounter (Signed)
 Left message for patient to return my call.

## 2024-02-12 ENCOUNTER — Other Ambulatory Visit: Payer: Self-pay | Admitting: *Deleted

## 2024-02-12 DIAGNOSIS — I25709 Atherosclerosis of coronary artery bypass graft(s), unspecified, with unspecified angina pectoris: Secondary | ICD-10-CM

## 2024-02-12 MED ORDER — REPATHA SURECLICK 140 MG/ML ~~LOC~~ SOAJ: SUBCUTANEOUS | 3 refills | Status: DC

## 2024-02-12 NOTE — Telephone Encounter (Signed)
 Patient notified of message below. Refill sent to the pharmacy.

## 2024-02-12 NOTE — Telephone Encounter (Signed)
 Left message for patient to return my call. Left several messages for patient to return my call.

## 2024-02-13 ENCOUNTER — Other Ambulatory Visit: Payer: 59

## 2024-02-13 ENCOUNTER — Ambulatory Visit: Payer: 59 | Admitting: Nurse Practitioner

## 2024-02-17 ENCOUNTER — Ambulatory Visit (INDEPENDENT_AMBULATORY_CARE_PROVIDER_SITE_OTHER): Payer: 59 | Admitting: Family Medicine

## 2024-02-17 ENCOUNTER — Encounter: Payer: Self-pay | Admitting: Family Medicine

## 2024-02-17 VITALS — BP 160/86 | HR 77 | Temp 98.0°F | Resp 18 | Ht 62.0 in | Wt 184.0 lb

## 2024-02-17 DIAGNOSIS — K529 Noninfective gastroenteritis and colitis, unspecified: Secondary | ICD-10-CM

## 2024-02-17 DIAGNOSIS — R2689 Other abnormalities of gait and mobility: Secondary | ICD-10-CM

## 2024-02-17 DIAGNOSIS — E785 Hyperlipidemia, unspecified: Secondary | ICD-10-CM | POA: Diagnosis not present

## 2024-02-17 DIAGNOSIS — E538 Deficiency of other specified B group vitamins: Secondary | ICD-10-CM | POA: Diagnosis not present

## 2024-02-17 DIAGNOSIS — E1165 Type 2 diabetes mellitus with hyperglycemia: Secondary | ICD-10-CM | POA: Diagnosis not present

## 2024-02-17 DIAGNOSIS — R269 Unspecified abnormalities of gait and mobility: Secondary | ICD-10-CM | POA: Diagnosis not present

## 2024-02-17 DIAGNOSIS — I6523 Occlusion and stenosis of bilateral carotid arteries: Secondary | ICD-10-CM

## 2024-02-17 NOTE — Patient Instructions (Signed)
 See gyn to get the ring out.

## 2024-02-17 NOTE — Progress Notes (Signed)
 Subjective:     Patient ID: [name], female    DOB: 10/03/1951, 73 y.o.   MRN: [id number]  Chief Complaint  Patient presents with   Medical Management of Chronic Issues    Follow-up to discuss new medications, referrals and paperwork    HPI Discussed the use of AI scribe software for clinical note transcription with the patient, who gave verbal consent to proceed.  History of Present Illness   [name] is a 73 year old female with exocrine pancreatic insufficiency who presents with diarrhea and abdominal pain.  She has been experiencing ongoing diarrhea and abdominal pain, for which she recently consulted a gastroenterologist. She has been started on Zenpep, taking two pills before each meal and one with a snack, to manage her symptoms. She has received two large bottles of the medication and is monitoring its effectiveness.  She mentions a history of yellowing in her one eye after having a "burst" vessel, which she initially associated with her pancreas issues. However, it was explained to her that the yellowing was due to a burst blood vessel, similar to a bruise healing process.  She has a history of high cholesterol, managed with Repatha, and diabetes, which she attempts to control through diet due to sensitivity to medications. She also has a B12 deficiency, for which she receives regular injections.  She experiences swelling in her legs and takes torsemide every other day, although she suspects an allergic reaction to the medication.very intoleratnt to other diuretics.  She also takes a baby aspirin daily, having reduced from a higher dose due to potential side effects.  She has experienced issues with a vaginal ring, which caused significant pain upon insertion and led to a collapse of the vagina. She has not returned for follow-up due to fear of pain. ring has been in place for 4 months  She takes potassium supplements daily, ensuring she consumes a banana or  two potassium pills to maintain her levels. Her blood pressure has been variable, and she no longer takes Lyrica or Systane eye drops.  She uses a cane for mobility and has a handicap parking placard due to her limited ability to walk long distances.       Health Maintenance Due  Topic Date Due   OPHTHALMOLOGY EXAM  Never done   Diabetic kidney evaluation - Urine ACR  01/24/2024    Past Medical History:  Diagnosis Date   Allergy    Anemia    Arthritis    Asthma    Dianosed years ago-no meds at this time   Cancer [facility]) 2021   endometrial    Cataract    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 name of MD    Dementia (HCC)    Diabetes mellitus    type 2    Diverticulosis    Dyslipidemia    Dyspnea    with exertion    Dysrhythmia    "skips beats"   Endometrial cancer (HCC) 2021   Family history of adverse reaction to anesthesia    son stopped breathing during surgery , mother slow to wake up    Fibromyalgia 1980's   Generalized anxiety disorder    GERD (gastroesophageal reflux disease)    occ- will use Tums and Prolosec  prn    Heart murmur  Hyperlipemia    Hypertension    not on blood pressure meds due to allergies - last dose of meds- months ago    Hypothyroidism    Hypothyroidism (acquired)    Internal hemorrhoids    Irritable bowel syndrome 1980's   Liver cancer (HCC)    Major depressive disorder    Migraine headaches 1980's   On meds-well controlled   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   Morbid obesity (HCC)    Pernicious anemia    Pneumonia 2009   Several times over the past several years   PONV (postoperative nausea and vomiting)    was told by neurologist due to calcificaton in brain not to be put to sleep, N/V during Op and Recovery    Recurrent upper respiratory infection (URI)    Sleep apnea    no cpap    Subarachnoid hemorrhage (HCC) 2010   Vaginal polyp    benign per pt    Past Surgical History:  Procedure Laterality Date   BIOPSY  01/07/2024   Procedure: BIOPSY;  Surgeon: Napoleon Form, MD;  Location: [facility];  Service: Gastroenterology;;   CATARACT EXTRACTION Right    CHOLECYSTECTOMY  1983   COLONOSCOPY  2008   DB   COLONOSCOPY WITH PROPOFOL N/A 01/07/2024   Procedure: COLONOSCOPY WITH PROPOFOL;  Surgeon: Napoleon Form, MD;  Location: [facility];  Service: Gastroenterology;  Laterality: N/A;   DILATION AND CURETTAGE OF UTERUS  1972   ESOPHAGOGASTRODUODENOSCOPY (EGD) WITH PROPOFOL N/A 01/07/2024   Procedure: ESOPHAGOGASTRODUODENOSCOPY (EGD) WITH PROPOFOL;  Surgeon: Napoleon Form, MD;  Location: [facility];  Service: Gastroenterology;  Laterality: N/A;   HYSTEROSCOPY WITH D & C  06/29/2011   Procedure: DILATATION AND CURETTAGE (D&C) /HYSTEROSCOPY;  Surgeon: Reva Bores, MD;  Location: [facility];  Service: Gynecology;  Laterality: N/A;   ROBOTIC ASSISTED TOTAL HYSTERECTOMY WITH BILATERAL SALPINGO OOPHERECTOMY N/A 10/27/2020   Procedure: XI ROBOTIC ASSISTED TOTAL HYSTERECTOMY WITH BILATERAL SALPINGO OOPHORECTOMY;  Surgeon: Adolphus Birchwood, MD;  Location: [facility];  Service: Gynecology;  Laterality: N/A;   SENTINEL NODE BIOPSY N/A 10/27/2020   Procedure: SENTINEL NODE BIOPSY;  Surgeon: Adolphus Birchwood, MD;  Location: [facility];  Service: Gynecology;  Laterality: N/A;     Current Outpatient Medications:    aspirin EC 81 MG tablet, Take 81 mg by mouth daily. Swallow whole., Disp: , Rfl:    Cholecalciferol (VITAMIN D3) 25 MCG (1000 UT) capsule, Take 1,000 Units by mouth daily., Disp: , Rfl:    estradiol (ESTRING) 7.5 MCG/24HR vaginal ring, Place 1 each vaginally every 3 (three) months., Disp: 1 each, Rfl: 3   Evolocumab (REPATHA SURECLICK) 140 MG/ML SOAJ, INJECT 1 PEN SUBCUTANEOUSLY EVERY 14 DAYS, Disp: 6 mL, Rfl: 3   glucose blood (ACCU-CHEK GUIDE) test strip, Use to test blood  sugar 3 times daily (Patient taking differently: 1 each as needed. Use to test blood sugar 3 times daily), Disp: 100 each, Rfl: 2   levothyroxine (SYNTHROID) 50 MCG tablet, TAKE 1 TABLET BY MOUTH EVERY DAY BEFORE BREAKFAST, Disp: 90 tablet, Rfl: 3   MAGNESIUM CITRATE PO, Take 1 tablet by mouth 2 (two) times daily., Disp: , Rfl:    Multiple Vitamin (MULTIVITAMIN WITH MINERALS) TABS tablet, Take 1 tablet by mouth daily., Disp: 30 tablet, Rfl: 0   Pancrelipase, Lip-Prot-Amyl, (ZENPEP) 40000-126000 units CPEP, Take 2 capsules by mouth with meals and 1 capsule with snacks., Disp: , Rfl:    Pancrelipase, Lip-Prot-Amyl, (ZENPEP) 40000-126000 units CPEP, Take 2 capsules  by mouth with meals and one capsule with snacks, Disp: 240 capsule, Rfl: 2   pantoprazole (PROTONIX) 40 MG tablet, Take 1 tablet (40 mg total) by mouth 2 (two) times daily before a meal., Disp: 180 tablet, Rfl: 3   potassium chloride SA (KLOR-CON M) 20 MEQ tablet, Take 1 tablet (20 mEq total) by mouth 2 (two) times daily., Disp: 180 tablet, Rfl: 1   torsemide (DEMADEX) 20 MG tablet, TAKE 1 TABLET BY MOUTH ONCE DAILY (Patient taking differently: Take 20 mg by mouth every other day.), Disp: 90 tablet, Rfl: 1  Current Facility-Administered Medications:    cyanocobalamin (VITAMIN B12) injection 1,000 mcg, 1,000 mcg, Intramuscular, Q30 days, Jeani Sow, MD, 1,000 mcg at 02/17/24 1337  Allergies  Allergen Reactions   Atenolol Other (See Comments)    Stomach problems, chills   Atorvastatin Other (See Comments)    Retained fluid   Canagliflozin Other (See Comments)    ALL DIABETIC MEDS PER PT   abdominal pain, headache   Losartan Potassium Other (See Comments)    chills, HA, stomach pain   Acarbose Nausea Only and Other (See Comments)     headache,nausea   Cinnamon Other (See Comments)    Stomach pain   Empagliflozin Other (See Comments)     headaches   Glimepiride Other (See Comments)    ALL DIABETIC MEDS PER PT  abdominal  pain, headache   Metformin And Related Nausea And Vomiting and Other (See Comments)    Patient states that she has severe chills, headache and cramping additionally. Says blood sugar is uncontrolled because of this   Other Other (See Comments)    ALL DIABETIC MEDS PER PT    Pioglitazone Other (See Comments)    ALL DIABETIC MEDS PER PT = peripheral edema   Pravastatin Other (See Comments)    Retained fluid   Sitagliptin Other (See Comments)    ALL DIABETIC MEDS PER PT  Other reaction(s): headache   Spironolactone-Hctz Other (See Comments)   Statins Other (See Comments)    Myalgia   Sulfamethoxazole Other (See Comments)    ALL DIABETIC MEDS PER PT    Tramadol Hcl Itching   Atorvastatin Calcium Other (See Comments)     retain fld   Augmentin [Amoxicillin-Pot Clavulanate] Other (See Comments)    Caused jaundice   Crestor [Rosuvastatin] Other (See Comments)   Rosuvastatin Calcium    Torsemide Nausea Only    Gi upset, chills, abd pain,bloating   Bumex [Bumetanide] Rash and Other (See Comments)    Chills, rash, headaches, stomach issues also   Lasix [Furosemide] Rash and Other (See Comments)    Sugars increased   Sulfa Antibiotics Rash and Other (See Comments)    Mother has told patient in the past not to take, she cannot recall there reaction   ROS neg/noncontributory except as noted HPI/below      Objective:     BP (!) 160/86   Pulse 77   Temp 98 F (36.7 C) (Temporal)   Resp 18   Ht 5\' 2"  (1.575 m)   Wt 184 lb (83.5 kg)   SpO2 98%   BMI 33.65 kg/m  Wt Readings from Last 3 Encounters:  02/17/24 184 lb (83.5 kg)  02/10/24 187 lb 3.2 oz (84.9 kg)  02/05/24 198 lb 3.2 oz (89.9 kg)    Physical Exam   Gen: [facility] NAD HEENT: NCAT, conjunctiva not injected, sclera nonicteric NECK:  supple, no thyromegaly, no nodes, no carotid bruits CARDIAC: RRR,  S1S2+, no murmur. DP 2+B LUNGS: CTAB. No wheezes ABDOMEN:  BS+, soft, NTND, No HSM, no masses EXT:  no edema MSK: no  gross abnormalities.  NEURO: A&O x3.  CN II-XII intact.  PSYCH: normal mood. Good eye contact     Assessment & Plan:  Gait abnormality -     Ambulatory referral to Physical Therapy  Bilateral carotid artery stenosis  Chronic diarrhea  Controlled type 2 diabetes mellitus with hyperglycemia, without long-term current use of insulin (HCC)  Dyslipidemia  Impairment of balance  Assessment and Plan    Exocrine Pancreatic Insufficiency (EPI)   She experiences diarrhea and abdominal pain, likely due to EPI. Treatment with Zenpep has been initiated, with instructions to take two pills before each meal and one with a snack. The effectiveness of this treatment will be evaluated before finalizing the prescription. She has received two large bottles of Zenpep to start. Continue Zenpep as directed and follow up with the gastroenterologist in two months to assess its effectiveness.  Diabetes Mellitus   Her diabetes is managed primarily through diet due to medication sensitivities. Insulin was previously tried but is not currently in use.  Peripheral Edema   She experiences leg swelling, managed with torsemide every other day, though she reports feeling allergic to it and discomfort with its use. She takes potassium supplements daily to manage potential electrolyte imbalances and is aware of the need to monitor potassium intake to avoid hyperkalemia. Continue torsemide every other day and monitor potassium intake.  Chronic Kidney Disease   She has stage 3 chronic kidney disease, not attributed to diabetes. Surgical intervention for carotid artery stenosis is not considered until 80% stenosis is reached.  Hypertension   Her blood pressure is variable, with recent readings lower than previously recorded. No current plan to adjust treatment. very sensitive to mult meds trialed in past.  Hyperlipidemia   She is on Repatha for hyperlipidemia and plans to continue this medication without changes. form  filled out  B12 Deficiency   Her B12 deficiency is managed with regular B12 injections.- received today  Vaginal Prolapse   She experienced vaginal prolapse and had a vaginal ring inserted, which caused significant pain. It is uncertain if the ring is still in place. She is advised to follow up with a gynecologist to have it checked and possibly removed.  General Health Maintenance   She is advised to switch to baby aspirin, preferably chewable, to reduce potential gastrointestinal side effects. She is encouraged to continue physical activity, such as walking at the gym, and is aware of the need to ensure adequate hydration when swallowing medications to prevent them from getting stuck.        Return in about 4 weeks (around 03/16/2024) for chronic follow-up.  Angelena Sole, MD

## 2024-02-18 ENCOUNTER — Ambulatory Visit: Payer: 59 | Admitting: Family Medicine

## 2024-02-18 DIAGNOSIS — Z961 Presence of intraocular lens: Secondary | ICD-10-CM | POA: Diagnosis not present

## 2024-02-18 DIAGNOSIS — H353232 Exudative age-related macular degeneration, bilateral, with inactive choroidal neovascularization: Secondary | ICD-10-CM | POA: Diagnosis not present

## 2024-02-18 DIAGNOSIS — E119 Type 2 diabetes mellitus without complications: Secondary | ICD-10-CM | POA: Diagnosis not present

## 2024-02-19 ENCOUNTER — Ambulatory Visit: Payer: 59 | Admitting: Family Medicine

## 2024-02-21 ENCOUNTER — Telehealth: Payer: Self-pay | Admitting: Gastroenterology

## 2024-02-21 NOTE — Telephone Encounter (Signed)
 Inbound call from patient requesting to speak with [facility] regarding recent medication that was prescribed. Please advise, thank you.

## 2024-02-21 NOTE — Telephone Encounter (Signed)
 The patient is taking the Zenpep. She also stopped taking magnesium citrate. She reports some improvement in her diarrhea even though it is not completely better. She was unsure if a test for pancreatic insufficiency had been done.  She also reports she has abdominal pain, her vision is blurry, and she has chills. Patient reports she has chills when allergic to a medication. She denies any rashes.  The patient asks if you will advise on this information.

## 2024-02-24 ENCOUNTER — Other Ambulatory Visit: Payer: Self-pay | Admitting: Physical Medicine and Rehabilitation

## 2024-02-24 ENCOUNTER — Other Ambulatory Visit: Payer: Self-pay | Admitting: Family Medicine

## 2024-02-24 ENCOUNTER — Other Ambulatory Visit: Payer: Self-pay

## 2024-02-24 DIAGNOSIS — I6523 Occlusion and stenosis of bilateral carotid arteries: Secondary | ICD-10-CM

## 2024-02-24 DIAGNOSIS — R600 Localized edema: Secondary | ICD-10-CM

## 2024-02-25 ENCOUNTER — Other Ambulatory Visit: Payer: Self-pay | Admitting: Family

## 2024-02-25 NOTE — Telephone Encounter (Signed)
?    Does patient think she has reaction to Zenpep, it is unlikely.  If she is having improvement on Zenpep, please send prescription and advised patient to continue with meals

## 2024-02-27 ENCOUNTER — Other Ambulatory Visit: Payer: Self-pay | Admitting: Nurse Practitioner

## 2024-02-27 DIAGNOSIS — D509 Iron deficiency anemia, unspecified: Secondary | ICD-10-CM

## 2024-02-27 NOTE — Progress Notes (Signed)
 Patient Care Team: Jeani Sow, MD as PCP - General (Family Medicine) Corky Crafts, MD as PCP - Cardiology (Cardiology) Allwardt, Milus Mallick (Physician Assistant)  Clinic Day:  03/01/2024  Referring physician: Jeani Sow, MD  ASSESSMENT & PLAN:   Assessment & Plan: Iron deficiency anemia Likely iron deficiency of chronic disease. Anemia  has been intermittent for past several years, but consistent over the past 4 months.  Labs indicate mildly worse anemia today.  Awaiting results of ferritin and iron studies.  Consider treatment with IV iron if indicated. Labs and follow-up based on new results and possible treatment with IV iron.   Esophageal ulcer Possible contribution of patient's iron deficiency anemia. He reviewed endoscopy report done January 2025.  This did show esophageal ulcers with no evidence or stigmata of bleeding.  There was 2 cm hiatal hernia.  She has gastritis and erythematous duodenopathy.  Colonoscopy done at the same time showed normal mucosa throughout the colon with moderate diverticulosis.  There were nonbleeding internal and external hemorrhoids.  She continues to follow-up with her GI provider.   Fatigue Possibly related to worsening anemia or hypothyroid.  Anemia panel ordered today and will treat with IV iron as indicated.  Plan: Labs from today do indicate mild worsening of anemia.  Awaiting ferritin and iron with iron binding capacity. Continue B12 injections at home Consider iron infusions as indicated.  Reviewed potential risk factors and side effects of IV iron which include potential allergic reaction or infusion reaction.  Due to history of multiple medication allergies, would pretreat with Benadryl and Tylenol to prevent reactions.  Patient would like to proceed if necessary. Will follow-up with patient based on results of labs and need for IV iron.  She is agreeable to the plan.   The patient understands the plans discussed  today and is in agreement with them.  She knows to contact our office if she develops concerns prior to her next appointment.  I provided 25 minutes of face-to-face time during this encounter and > 50% was spent counseling as documented under my assessment and plan.    Carlean Jews, NP  [facility] - A DEPT OF [facility] [address] [place] [address] Dept: [phone] Dept Fax: [phone]   No orders of the defined types were placed in this encounter.     CHIEF COMPLAINT:  CC: Iron deficiency anemia  Current Treatment: B12 injections, supportive therapy  INTERVAL HISTORY:  [name] is here today for repeat clinical assessment.  Continues to get vitamin B12 injections per primary care.  Most recent B12 level was normal.  She has worsening fatigue. She denies chest pain, chest pressure, or shortness of breath. She denies headaches or visual disturbances.   She denies fevers or chills. She denies pain. Her appetite is good. Her weight has increased 5 pounds over last 3 weeks .  I have reviewed the past medical history, past surgical history, social history and family history with the patient and they are unchanged from previous note.  ALLERGIES:  is allergic to atenolol, atorvastatin, canagliflozin, losartan potassium, acarbose, cinnamon, empagliflozin, glimepiride, metformin and related, other, pioglitazone, pravastatin, sitagliptin, spironolactone-hctz, statins, sulfamethoxazole, tramadol hcl, atorvastatin calcium, augmentin [amoxicillin-pot clavulanate], crestor [rosuvastatin], rosuvastatin calcium, torsemide, bumex [bumetanide], lasix [furosemide], and sulfa antibiotics.  MEDICATIONS:  Current Outpatient Medications  Medication Sig Dispense Refill   aspirin EC 81 MG tablet Take 81 mg by mouth  daily. Swallow whole.     Cholecalciferol (VITAMIN D3) 25 MCG (1000 UT) capsule Take 1,000 Units by mouth daily.      estradiol (ESTRING) 7.5 MCG/24HR vaginal ring Place 1 each vaginally every 3 (three) months. 1 each 3   Evolocumab (REPATHA SURECLICK) 140 MG/ML SOAJ INJECT 1 PEN SUBCUTANEOUSLY EVERY 14 DAYS 6 mL 3   glucose blood (ACCU-CHEK GUIDE) test strip Use to test blood sugar 3 times daily (Patient taking differently: 1 each as needed. Use to test blood sugar 3 times daily) 100 each 2   levothyroxine (SYNTHROID) 50 MCG tablet TAKE 1 TABLET BY MOUTH EVERY DAY BEFORE BREAKFAST 90 tablet 3   MAGNESIUM CITRATE PO Take 1 tablet by mouth 2 (two) times daily.     Multiple Vitamin (MULTIVITAMIN WITH MINERALS) TABS tablet Take 1 tablet by mouth daily. 30 tablet 0   potassium chloride SA (KLOR-CON M) 20 MEQ tablet TAKE 1 TABLET BY MOUTH 2 TIMES DAILY 180 tablet 1   escitalopram (LEXAPRO) 5 MG tablet TAKE 1 TABLET BY MOUTH ONCE DAILY (Patient not taking: Reported on 02/28/2024) 30 tablet 1   Pancrelipase, Lip-Prot-Amyl, (ZENPEP) 40000-126000 units CPEP Take 2 capsules by mouth with meals and 1 capsule with snacks. (Patient not taking: Reported on 02/28/2024)     Pancrelipase, Lip-Prot-Amyl, (ZENPEP) 40000-126000 units CPEP Take 2 capsules by mouth with meals and one capsule with snacks (Patient not taking: Reported on 02/28/2024) 240 capsule 2   pantoprazole (PROTONIX) 40 MG tablet Take 1 tablet (40 mg total) by mouth 2 (two) times daily before a meal. (Patient not taking: Reported on 02/28/2024) 180 tablet 3   sertraline (ZOLOFT) 25 MG tablet TAKE 1 TABLET BY MOUTH ONCE DAILY (Patient not taking: Reported on 02/28/2024) 90 tablet 0   torsemide (DEMADEX) 20 MG tablet TAKE 1 TABLET BY MOUTH ONCE DAILY 90 tablet 1   traZODone (DESYREL) 50 MG tablet TAKE 1 TABLET BY MOUTH AT BEDTIME (Patient not taking: Reported on 02/28/2024) 90 tablet 3   Current Facility-Administered Medications  Medication Dose Route Frequency Provider Last Rate Last Admin   cyanocobalamin (VITAMIN B12) injection 1,000 mcg  1,000 mcg Intramuscular Q30  days Jeani Sow, MD   1,000 mcg at 02/17/24 1337    HISTORY OF PRESENT ILLNESS:   Oncology History  Endometrial cancer (HCC)  09/19/2020 Initial Biopsy   EMB  and showed 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    10/11/2020 Imaging   Pelvic MRI showed a deeply invasive myometrial extending tumor from the endometrium.  There was no apparent lymphadenopathy or extrauterine disease present, however this appeared to be at least clinical stage Ib.    10/18/2020 Initial Diagnosis   Endometrial cancer (HCC)   10/27/2020 Surgery   robotic assisted total hysterectomy, BSO, SLN biopsy. 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 MMR testing showed loss of expression of MLH1 and PMS2. 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        REVIEW OF SYSTEMS:   Constitutional: Denies fevers, chills or abnormal weight loss. fatigue Eyes: Denies blurriness of vision Ears, nose, mouth, throat, and face: Denies mucositis or sore throat Respiratory: Denies cough, dyspnea or  wheezes Cardiovascular: Denies palpitation, chest discomfort or lower extremity swelling Gastrointestinal:  Denies nausea, heartburn or change in bowel habits Skin: Denies abnormal skin rashes Lymphatics: Denies new lymphadenopathy or easy bruising Neurological:Denies numbness, tingling or new weaknesses Behavioral/Psych: Mood is stable, no new changes  All other systems were reviewed with the patient and are negative.   VITALS:   Today's Vitals   02/28/24 1300 02/28/24 1302  BP: (!) 168/76 (!) 166/70  Pulse: 92   Resp: (!) 22   Temp: (!) 97.3 F (36.3  C)   TempSrc: Temporal   SpO2: 99%   Weight: 192 lb 9.6 oz (87.4 kg)   Height: 5\' 2"  (1.575 m)    Body mass index is 35.23 kg/m.   Wt Readings from Last 3 Encounters:  02/28/24 192 lb 9.6 oz (87.4 kg)  02/17/24 184 lb (83.5 kg)  02/10/24 187 lb 3.2 oz (84.9 kg)    Body mass index is 35.23 kg/m.  Performance status (ECOG): 1 - Symptomatic but completely ambulatory  PHYSICAL EXAM:   GENERAL:alert, no distress and comfortable SKIN: skin color, texture, turgor are normal, no rashes or significant lesions EYES: normal, Conjunctiva are pink and non-injected, sclera clear OROPHARYNX:no exudate, no erythema and lips, buccal mucosa, and tongue normal  NECK: supple, thyroid normal size, non-tender, without nodularity LYMPH:  no palpable lymphadenopathy in the cervical, axillary or inguinal LUNGS: clear to auscultation and percussion with normal breathing effort HEART: regular rate & rhythm and no murmurs and no lower extremity edema ABDOMEN:abdomen soft, non-tender and normal bowel sounds Musculoskeletal:no cyanosis of digits and no clubbing  NEURO: alert & oriented x 3 with fluent speech, no focal motor/sensory deficits  LABORATORY DATA:  I have reviewed the data as listed    Component Value Date/Time   NA 140 02/28/2024 1226   NA 141 01/23/2023 0000   K 4.1 02/28/2024 1226   CL 109 02/28/2024 1226   CO2 24 02/28/2024 1226   GLUCOSE 183 (H) 02/28/2024 1226   BUN 31 (H) 02/28/2024 1226   BUN 19 01/23/2023 0000   CREATININE 1.36 (H) 02/28/2024 1226   CALCIUM 9.0 02/28/2024 1226   PROT 7.2 02/28/2024 1226   PROT 6.8 08/16/2021 1018   ALBUMIN 4.0 02/28/2024 1226   ALBUMIN 4.0 08/16/2021 1018   AST 19 02/28/2024 1226   ALT 13 02/28/2024 1226   ALKPHOS 61 02/28/2024 1226   BILITOT 0.5 02/28/2024 1226   GFRNONAA 41 (L) 02/28/2024 1226   GFRAA 73 04/12/2020 1347     Lab Results  Component Value Date   WBC 4.2 02/28/2024   NEUTROABS 2.8 02/28/2024   HGB 10.0 (L)  02/28/2024   HCT 31.3 (L) 02/28/2024   MCV 94.3 02/28/2024   PLT 224 02/28/2024     RADIOGRAPHIC STUDIES: VAS US CAROTID Result Date: 02/05/2024 Carotid Arterial Duplex Study Patient Name:  [name]  Date of Exam:   02/05/2024 Medical Rec #: [id number]          Accession #:    [id number] Date of Birth: 1951-04-11         Patient Gender: F Patient Age:   1 years Exam Location:  [facility] Procedure:      VAS US CAROTID Referring Phys: Lemar Livings --------------------------------------------------------------------------------  Indications:       Carotid artery disease. Risk Factors:      Hypertension, hyperlipidemia, coronary artery disease. Comparison Study:  07/31/2023  R=60-79, L=1-39 Performing Technologist: Dorthula Matas RVS, RCS  Examination Guidelines: A complete evaluation includes B-mode imaging, spectral Doppler, color Doppler, and power Doppler as needed of all accessible portions of each vessel. Bilateral testing is considered an integral part of a complete examination. Limited examinations for reoccurring indications may be performed as noted.  Right Carotid Findings: +----------+--------+--------+--------+------------------+----------------+           PSV cm/sEDV cm/sStenosisPlaque DescriptionComments         +----------+--------+--------+--------+------------------+----------------+ CCA Prox  124     11                                                 +----------+--------+--------+--------+------------------+----------------+ CCA Mid   69      19                                                 +----------+--------+--------+--------+------------------+----------------+ CCA Distal56      16                                                 +----------+--------+--------+--------+------------------+----------------+ ICA Prox  232     67      60-79%  heterogenous      low end of range  +----------+--------+--------+--------+------------------+----------------+ ICA Mid   88      18                                                 +----------+--------+--------+--------+------------------+----------------+ ICA Distal81      25                                                 +----------+--------+--------+--------+------------------+----------------+ ECA       120     17                                                 +----------+--------+--------+--------+------------------+----------------+ +----------+--------+-------+----------------+-------------------+           PSV cm/sEDV cmsDescribe        Arm Pressure (mmHG) +----------+--------+-------+----------------+-------------------+ [id number]            Multiphasic, [id number]                 +----------+--------+-------+----------------+-------------------+ +---------+--------+--+--------+--+---------+ VertebralPSV cm/s63EDV cm/s16Antegrade +---------+--------+--+--------+--+---------+  Left Carotid Findings: +----------+--------+--------+--------+------------------+--------+           PSV cm/sEDV cm/sStenosisPlaque DescriptionComments +----------+--------+--------+--------+------------------+--------+ CCA Prox  99      21                                         +----------+--------+--------+--------+------------------+--------+ CCA Mid  59      14                                         +----------+--------+--------+--------+------------------+--------+ CCA Distal66      14                                         +----------+--------+--------+--------+------------------+--------+ ICA Prox  41      13      1-39%   heterogenous               +----------+--------+--------+--------+------------------+--------+ ICA Mid   100     31                                         +----------+--------+--------+--------+------------------+--------+ ICA Distal142     43                                          +----------+--------+--------+--------+------------------+--------+ ECA       80      13                                         +----------+--------+--------+--------+------------------+--------+ +----------+--------+--------+----------------+-------------------+           PSV cm/sEDV cm/sDescribe        Arm Pressure (mmHG) +----------+--------+--------+----------------+-------------------+ [id number]             Multiphasic, [id number]                 +----------+--------+--------+----------------+-------------------+ +---------+--------+---+--------+--+---------+ VertebralPSV cm/s118EDV cm/s32Antegrade +---------+--------+---+--------+--+---------+   Summary: Right Carotid: Velocities in 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 Left Carotid: Velocities in the left ICA are consistent with a 1-39% stenosis. Vertebrals:  Bilateral vertebral arteries demonstrate antegrade flow. Subclavians: Normal flow hemodynamics were seen in bilateral subclavian              arteries. *See table(s) above for measurements and observations.  Electronically signed by Lemar Livings MD on 02/05/2024 at 12:51:52 PM.    Final

## 2024-02-27 NOTE — Telephone Encounter (Signed)
 The patient has stopped a lot of her medications. She felt worse, was tired of the polypharmacy and hoped that she could narrow down to the necessary medications only. Since stopping a lot of her medications, she reports she feels "60% better." She will be seeing a provider at the oncology office 02/28/24. I have encouraged her to take the bottles of the medications she continuing to take at this time for accuracy of the medication reconciliation. She has stopped Zenpep.

## 2024-02-27 NOTE — Assessment & Plan Note (Addendum)
 Likely iron deficiency of chronic disease. Anemia  has been intermittent for past several years, but consistent over the past 4 months.  Labs indicate mildly worse anemia today.  Awaiting results of ferritin and iron studies.  Consider treatment with IV iron if indicated. Labs and follow-up based on new results and possible treatment with IV iron.

## 2024-02-27 NOTE — Telephone Encounter (Signed)
 ok

## 2024-02-28 ENCOUNTER — Inpatient Hospital Stay (HOSPITAL_BASED_OUTPATIENT_CLINIC_OR_DEPARTMENT_OTHER): Payer: 59 | Admitting: Nurse Practitioner

## 2024-02-28 ENCOUNTER — Inpatient Hospital Stay: Payer: 59 | Attending: Nurse Practitioner

## 2024-02-28 VITALS — BP 166/70 | HR 92 | Temp 97.3°F | Resp 22 | Ht 62.0 in | Wt 192.6 lb

## 2024-02-28 DIAGNOSIS — C541 Malignant neoplasm of endometrium: Secondary | ICD-10-CM | POA: Insufficient documentation

## 2024-02-28 DIAGNOSIS — R5383 Other fatigue: Secondary | ICD-10-CM | POA: Diagnosis not present

## 2024-02-28 DIAGNOSIS — K2210 Ulcer of esophagus without bleeding: Secondary | ICD-10-CM | POA: Insufficient documentation

## 2024-02-28 DIAGNOSIS — Z79899 Other long term (current) drug therapy: Secondary | ICD-10-CM | POA: Insufficient documentation

## 2024-02-28 DIAGNOSIS — D509 Iron deficiency anemia, unspecified: Secondary | ICD-10-CM | POA: Insufficient documentation

## 2024-02-28 LAB — CMP (CANCER CENTER ONLY)
ALT: 13 U/L (ref 0–44)
AST: 19 U/L (ref 15–41)
Albumin: 4 g/dL (ref 3.5–5.0)
Alkaline Phosphatase: 61 U/L (ref 38–126)
Anion gap: 7 (ref 5–15)
BUN: 31 mg/dL — ABNORMAL HIGH (ref 8–23)
CO2: 24 mmol/L (ref 22–32)
Calcium: 9 mg/dL (ref 8.9–10.3)
Chloride: 109 mmol/L (ref 98–111)
Creatinine: 1.36 mg/dL — ABNORMAL HIGH (ref 0.44–1.00)
GFR, Estimated: 41 mL/min — ABNORMAL LOW (ref >60)
Glucose, Bld: 183 mg/dL — ABNORMAL HIGH (ref 70–99)
Potassium: 4.1 mmol/L (ref 3.5–5.1)
Sodium: 140 mmol/L (ref 135–145)
Total Bilirubin: 0.5 mg/dL (ref 0.0–1.2)
Total Protein: 7.2 g/dL (ref 6.5–8.1)

## 2024-02-28 LAB — CBC WITH DIFFERENTIAL/PLATELET
Abs Immature Granulocytes: 0.02 10*3/uL (ref 0.00–0.07)
Basophils Absolute: 0 10*3/uL (ref 0.0–0.1)
Basophils Relative: 1 %
Eosinophils Absolute: 0.2 10*3/uL (ref 0.0–0.5)
Eosinophils Relative: 5 %
HCT: 31.3 % — ABNORMAL LOW (ref 36.0–46.0)
Hemoglobin: 10 g/dL — ABNORMAL LOW (ref 12.0–15.0)
Immature Granulocytes: 1 %
Lymphocytes Relative: 21 %
Lymphs Abs: 0.9 10*3/uL (ref 0.7–4.0)
MCH: 30.1 pg (ref 26.0–34.0)
MCHC: 31.9 g/dL (ref 30.0–36.0)
MCV: 94.3 fL (ref 80.0–100.0)
Monocytes Absolute: 0.3 10*3/uL (ref 0.1–1.0)
Monocytes Relative: 7 %
Neutro Abs: 2.8 10*3/uL (ref 1.7–7.7)
Neutrophils Relative %: 65 %
Platelets: 224 10*3/uL (ref 150–400)
RBC: 3.32 MIL/uL — ABNORMAL LOW (ref 3.87–5.11)
RDW: 14.8 % (ref 11.5–15.5)
WBC: 4.2 10*3/uL (ref 4.0–10.5)
nRBC: 0 % (ref 0.0–0.2)

## 2024-02-28 LAB — FERRITIN: Ferritin: 124 ng/mL (ref 11–307)

## 2024-02-28 LAB — VITAMIN B12: Vitamin B-12: 797 pg/mL (ref 180–914)

## 2024-02-28 LAB — FOLATE: Folate: 14.7 ng/mL (ref >5.9)

## 2024-03-01 ENCOUNTER — Encounter: Payer: Self-pay | Admitting: Nurse Practitioner

## 2024-03-04 ENCOUNTER — Encounter: Payer: Self-pay | Admitting: Podiatry

## 2024-03-04 ENCOUNTER — Other Ambulatory Visit: Payer: Self-pay | Admitting: Family

## 2024-03-04 ENCOUNTER — Ambulatory Visit (INDEPENDENT_AMBULATORY_CARE_PROVIDER_SITE_OTHER): Payer: 59 | Admitting: Podiatry

## 2024-03-04 DIAGNOSIS — E1122 Type 2 diabetes mellitus with diabetic chronic kidney disease: Secondary | ICD-10-CM

## 2024-03-04 DIAGNOSIS — N1831 Chronic kidney disease, stage 3a: Secondary | ICD-10-CM

## 2024-03-04 DIAGNOSIS — B351 Tinea unguium: Secondary | ICD-10-CM

## 2024-03-04 DIAGNOSIS — M79675 Pain in left toe(s): Secondary | ICD-10-CM | POA: Diagnosis not present

## 2024-03-04 DIAGNOSIS — M79674 Pain in right toe(s): Secondary | ICD-10-CM | POA: Diagnosis not present

## 2024-03-09 NOTE — Progress Notes (Signed)
 Subjective:  Patient ID: [name], female    DOB: [birth year],  MRN: [id number]  [name] presents to clinic today for at risk foot care. Pt has h/o NIDDM with chronic kidney disease and painful, elongated thickened toenails x 10 which are symptomatic when wearing enclosed shoe gear. This interferes with his/her daily activities. Patient declines callus debridement today. States she files them herself. Chief Complaint  Patient presents with   Diabetes    "Nails"  saw Dr. Lutricia Horsfall on 02/17/2024, A1c - 6   New problem(s): None.   PCP is Jeani Sow, MD.  Allergies  Allergen Reactions   Atenolol Other (See Comments)    Stomach problems, chills   Atorvastatin Other (See Comments)    Retained fluid   Canagliflozin Other (See Comments)    ALL DIABETIC MEDS PER PT   abdominal pain, headache   Losartan Potassium Other (See Comments)    chills, HA, stomach pain   Acarbose Nausea Only and Other (See Comments)     headache,nausea   Cinnamon Other (See Comments)    Stomach pain   Empagliflozin Other (See Comments)     headaches   Glimepiride Other (See Comments)    ALL DIABETIC MEDS PER PT  abdominal pain, headache   Metformin And Related Nausea And Vomiting and Other (See Comments)    Patient states that she has severe chills, headache and cramping additionally. Says blood sugar is uncontrolled because of this   Other Other (See Comments)    ALL DIABETIC MEDS PER PT    Pioglitazone Other (See Comments)    ALL DIABETIC MEDS PER PT = peripheral edema   Pravastatin Other (See Comments)    Retained fluid   Sitagliptin Other (See Comments)    ALL DIABETIC MEDS PER PT  Other reaction(s): headache   Spironolactone-Hctz Other (See Comments)   Statins Other (See Comments)    Myalgia   Sulfamethoxazole Other (See Comments)    ALL DIABETIC MEDS PER PT    Tramadol Hcl Itching   Atorvastatin Calcium Other (See Comments)     retain fld   Augmentin [Amoxicillin-Pot  Clavulanate] Other (See Comments)    Caused jaundice   Crestor [Rosuvastatin] Other (See Comments)   Rosuvastatin Calcium    Torsemide Nausea Only    Gi upset, chills, abd pain,bloating   Bumex [Bumetanide] Rash and Other (See Comments)    Chills, rash, headaches, stomach issues also   Lasix [Furosemide] Rash and Other (See Comments)    Sugars increased   Sulfa Antibiotics Rash and Other (See Comments)    Mother has told patient in the past not to take, she cannot recall there reaction    Review of Systems: Negative except as noted in the HPI.  Objective: No changes noted in today's physical examination. There were no vitals filed for this visit. [name] is a pleasant 73 y.o. female in NAD. AAO x 3.   Vascular Examination: Vascular status intact b/l with palpable pedal pulses. CFT immediate b/l. Pedal hair present. No edema. No pain with calf compression b/l. Skin temperature gradient WNL b/l. No varicosities noted. No cyanosis or clubbing noted.  Neurological Examination: Sensation grossly intact b/l with 10 gram monofilament.   Dermatological Examination: Pedal skin with normal turgor, texture and tone b/l. No open wounds nor interdigital macerations noted. Toenails 1-5 b/l thick, discolored, elongated with subungual debris and pain on dorsal palpation. Hyperkeratotic lesion(s) of both feet.  No erythema, no edema, no  drainage, no fluctuance.  Musculoskeletal Examination: Muscle strength 5/5 to b/l LE.  HAV with bunion deformity noted b/l LE. Uses cane for ambulation assistance today.  Radiographs: None  Assessment/Plan: 1. Pain due to onychomycosis of toenails of both feet   2. 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 Patient examined. All patient's and/or POA's questions/concerns addressed on today's visit. Toenails 1-5 debrided in length and girth without  incident. Continue foot and shoe inspections daily. Monitor blood glucose per PCP/Endocrinologist's recommendations. Continue soft, supportive shoe gear daily. Report any pedal injuries to medical professional. Call office if there are any questions/concerns. -Patient/POA to call should there be question/concern in the interim.   Return in about 3 months (around 06/04/2024).  Freddie Breech, DPM      [place] LOCATION: [address], [place] [address]                   Office [phone]   [facility] LOCATION: [address] [place], [place] [address] Office [phone]

## 2024-03-10 ENCOUNTER — Ambulatory Visit: Admitting: Physical Therapy

## 2024-03-10 NOTE — Therapy (Signed)
 OUTPATIENT PHYSICAL THERAPY NEURO EVALUATION   Patient Name: [name] MRN: [id number] DOB:1951-06-08, 73 y.o., female Today's Date: 03/11/2024   PCP: Jeani Sow REFERRING PROVIDER: Jeani Sow  END OF SESSION:  PT End of Session - 03/11/24 1047     Visit Number 1    Date for PT Re-Evaluation 06/03/24    Authorization Type [employer]    PT Start Time 1100    PT Stop Time 1145    PT Time Calculation (min) 45 min             Past Medical History:  Diagnosis Date   Allergy    Anemia    Arthritis    Asthma    Dianosed years ago-no meds at this time   Cancer [facility]) 2021   endometrial    Cataract    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 name of MD    Dementia (HCC)    Diabetes mellitus    type 2    Diverticulosis    Dyslipidemia    Dyspnea    with exertion    Dysrhythmia    "skips beats"   Endometrial cancer (HCC) 2021   Family history of adverse reaction to anesthesia    son stopped breathing during surgery , mother slow to wake up    Fibromyalgia 1980's   Generalized anxiety disorder    GERD (gastroesophageal reflux disease)    occ- will use Tums and Prolosec  prn    Heart murmur    Hyperlipemia    Hypertension    not on blood pressure meds due to allergies - last dose of meds- months ago    Hypothyroidism    Hypothyroidism (acquired)    Internal hemorrhoids    Irritable bowel syndrome 1980's   Liver cancer (HCC)    Major depressive disorder    Migraine headaches 1980's   On meds-well controlled   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   Morbid obesity (HCC)    Pernicious anemia    Pneumonia 2009   Several times over the past several years   PONV (postoperative nausea and vomiting)    was told by neurologist due to calcificaton in brain not to be put to sleep, N/V during Op and Recovery    Recurrent  upper respiratory infection (URI)    Sleep apnea    no cpap   Subarachnoid hemorrhage (HCC) 2010   Vaginal polyp    benign per pt   Past Surgical History:  Procedure Laterality Date   BIOPSY  01/07/2024   Procedure: BIOPSY;  Surgeon: Napoleon Form, MD;  Location: [facility];  Service: Gastroenterology;;   CATARACT EXTRACTION Right    CHOLECYSTECTOMY  1983   COLONOSCOPY  2008   DB   COLONOSCOPY WITH PROPOFOL N/A 01/07/2024   Procedure: COLONOSCOPY WITH PROPOFOL;  Surgeon: Napoleon Form, MD;  Location: [facility];  Service: Gastroenterology;  Laterality: N/A;   DILATION AND CURETTAGE OF UTERUS  1972   ESOPHAGOGASTRODUODENOSCOPY (EGD) WITH PROPOFOL N/A 01/07/2024   Procedure: ESOPHAGOGASTRODUODENOSCOPY (EGD) WITH PROPOFOL;  Surgeon: Napoleon Form, MD;  Location: [facility];  Service: Gastroenterology;  Laterality: N/A;   HYSTEROSCOPY WITH D & C  06/29/2011   Procedure: DILATATION AND CURETTAGE (D&C) /HYSTEROSCOPY;  Surgeon: Reva Bores, MD;  Location: [facility];  Service: Gynecology;  Laterality:  N/A;   ROBOTIC ASSISTED TOTAL HYSTERECTOMY WITH BILATERAL SALPINGO OOPHERECTOMY N/A 10/27/2020   Procedure: XI ROBOTIC ASSISTED TOTAL HYSTERECTOMY WITH BILATERAL SALPINGO OOPHORECTOMY;  Surgeon: Adolphus Birchwood, MD;  Location: [facility];  Service: Gynecology;  Laterality: N/A;   SENTINEL NODE BIOPSY N/A 10/27/2020   Procedure: SENTINEL NODE BIOPSY;  Surgeon: Adolphus Birchwood, MD;  Location: [facility];  Service: Gynecology;  Laterality: N/A;   Patient Active Problem List   Diagnosis Date Noted   Medication intolerance 01/20/2024   Anemia, chronic disease 01/07/2024   Esophageal dysphagia 01/07/2024   Diarrhea of presumed infectious origin 09/18/2023   Full incontinence of feces 09/18/2023   Iron deficiency anemia 08/11/2023   Acute idiopathic gout of right foot 07/24/2023   Jaundice 03/26/2023   Intermediate stage nonexudative age-related macular degeneration of left eye 12/19/2022    Bilateral hearing loss 06/04/2022   Dizziness 06/04/2022   Perforation of left tympanic membrane 06/04/2022   Primary insomnia 05/06/2022   B12 deficiency 05/06/2022   Local edema 05/06/2022   Neuropathic pain    Essential hypertension    Controlled type 2 diabetes mellitus with hyperglycemia, without long-term current use of insulin (HCC)    Depression    T7 vertebral fracture (HCC) 02/07/2022   Malnutrition of moderate degree 02/05/2022   Closed T7 fracture (HCC) 01/31/2022   Dementia (HCC) 01/31/2022   Diabetes mellitus without complication (HCC) 01/31/2022   Dyslipidemia 01/31/2022   Hypertension 01/31/2022   Hypothyroidism (acquired) 01/31/2022   Cervical stenosis of spinal canal 01/31/2022   Falls frequently 01/31/2022   Bilateral hand pain 01/31/2022   Vaginal discharge 11/26/2021   Anxiety and depression 10/30/2020   Carotid artery stenosis 10/30/2020   Facial droop 10/29/2020   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   HLD (hyperlipidemia) 10/29/2020   Endometrial cancer (HCC) 10/18/2020   CAD (coronary artery disease) 05/11/2020   Acute encephalopathy 02/26/2020   Hypothyroidism    Cerebral calcification 02/10/2020   Impairment of balance 02/10/2020   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   Essential tremor 06/24/2018   Migraine without aura 06/24/2018   Stress at home 08/28/2015   Post-menopausal bleeding 06/16/2011   Kidney disease 06/16/2011   Colon polyp 06/16/2011   Vaginal polyp 06/16/2011   Chronic diarrhea 06/16/2011   Diverticulitis 06/16/2011   DUB (dysfunctional uterine bleeding) 06/16/2011   Fatty liver 06/16/2011   Type 2 diabetes mellitus with stage 3 chronic kidney disease, without long-term current use of insulin (HCC) 06/16/2011    ONSET DATE: 02/17/24  REFERRING DIAG:  R26.9 (ICD-10-CM) - Gait abnormality    THERAPY DIAG:  Abnormal posture  Difficulty in walking, not elsewhere classified  Muscle weakness (generalized)  Other  abnormalities of gait and mobility  Other lack of coordination  Unsteadiness on feet  Rationale for Evaluation and Treatment: Rehabilitation  SUBJECTIVE:  SUBJECTIVE STATEMENT: I have had an enormous amount of problems. I am under a lot of stress, I have been sick. And I taking care of my daughter.   Pt accompanied by:  with daughter   PERTINENT HISTORY: gait abnormality, bilateral carotid artery stenosis, chronic diarrhea, type 2 DM, dyslipidemia, impairment of balance, HTN, CKD stage 3, B12 deficiency   PAIN:  Are you having pain? Yes: NPRS scale: 4-5/10 Pain location: all over Pain description: chronic pain  Aggravating factors: I hurt all the time I don't know why Relieving factors: moving around might help some  PRECAUTIONS: Fall  RED FLAGS: Bowel or bladder incontinence: Yes: reports chronic diarrhea     WEIGHT BEARING RESTRICTIONS: No  FALLS: Has patient fallen in last 6 months? Yes. Number of falls 5 *last one was a week ago while walking down the road in the yard  LIVING ENVIRONMENT: Lives with: lives with their family Lives in: House/apartment Stairs: No Has following equipment at home: Single point cane and Environmental consultant - 2 wheeled  PLOF: Independent with basic ADLs and Independent with household mobility without device  PATIENT GOALS: just doing any exercises that might help me move better  OBJECTIVE:  Note: Objective measures were completed at Evaluation unless otherwise noted.  DIAGNOSTIC FINDINGS: Brain: No restricted diffusion to suggest acute or subacute infarct. No acute hemorrhage, mass, mass effect, or midline shift. No hydrocephalus or extra-axial collection. Pituitary and craniocervical junction within normal limits.   Mineralization basal ganglia and dentate nuclei.  Additional more punctate foci of hemosiderin deposition left cerebellum, right posterior frontal lobe, and right temporal lobe, likely sequela of prior hypertensive microhemorrhages. T2 hyperintense signal in the periventricular white matter, likely the sequela of chronic small vessel ischemic disease.   Vascular: Normal arterial flow voids.   Skull and upper cervical spine: Normal marrow signal.   Sinuses/Orbits: Clear paranasal sinuses. No acute finding in the orbits. Status post bilateral lens replacements.   Other: The mastoid air cells are well aerated.   IMPRESSION: No acute intracranial process. No evidence of acute or subacute infarct.  COGNITION: Overall cognitive status: Within functional limits for tasks assessed slow with processing at times and gets fixated   SENSATION: WFL  COORDINATION: Decreased coordination d/t gait and balance abnormalities   POSTURE: rounded shoulders and forward head  LOWER EXTREMITY ROM:   grossly WFL   LOWER EXTREMITY MMT:    MMT Right Eval Left Eval  Hip flexion 3+ 3+  Hip extension    Hip abduction 4 4  Hip adduction 4 4  Hip internal rotation    Hip external rotation    Knee flexion 4- 4-  Knee extension 4- 4-  Ankle dorsiflexion    Ankle plantarflexion    Ankle inversion    Ankle eversion    (Blank rows = not tested)  TRANSFERS: Assistive device utilized: Single point cane  Sit to stand: Modified independence Stand to sit: Modified independence  GAIT: Gait pattern: decreased arm swing- Right, decreased arm swing- Left, decreased step length- Right, decreased step length- Left, decreased stride length, shuffling, ataxic, wide BOS, poor foot clearance- Right, and poor foot clearance- Left Distance walked: in clinic distances Assistive device utilized: Single point cane Level of assistance: Modified independence Comments: feet at externally rotated   FUNCTIONAL TESTS:  5 times sit to stand: 19.41s Timed up and go  (TUG): 16.86s with SPC  TREATMENT DATE: 03/11/24- EVAL     PATIENT EDUCATION: Education details: POC, HEP Person educated: Patient Education method: Medical illustrator Education comprehension: verbalized understanding and returned demonstration  HOME EXERCISE PROGRAM: Access Code: [email or web address] URL: [email or web address] Date: 03/11/2024 Prepared by: Cassie Freer  Exercises - Sit to Stand  - 1 x daily - 7 x weekly - 2 sets - 10 reps - Standing March with Counter Support  - 1 x daily - 7 x weekly - 2 sets - 10 reps - Standing Hip Abduction with Counter Support  - 1 x daily - 7 x weekly - 2 sets - 10 reps - Seated Hip Abduction with Resistance  - 1 x daily - 7 x weekly - 2 sets - 10 reps - Seated March with Resistance  - 1 x daily - 7 x weekly - 2 sets - 10 reps  GOALS: Goals reviewed with patient? Yes  SHORT TERM GOALS: Target date: 04/22/24  Patient will be independent with initial HEP. Baseline: given 03/11/24 Goal status: INITIAL  2.  Patient will demonstrate decreased fall risk by scoring < 14 sec on TUG. Baseline: 16.86s with SPC Goal status: INITIAL   LONG TERM GOALS: Target date: 06/03/24  Patient will be independent with advanced/ongoing HEP to improve outcomes and carryover.  Baseline:  Goal status: INITIAL  2.  Patient will demonstrate decreased fall risk by scoring < 14 sec on TUG w/o AD Baseline: 16.86s with SPC Goal status: INITIAL  3.  Patient will demonstrate improved functional LE strength as demonstrated by 5xSTS <15s. Baseline: 19.41s Goal status: INITIAL  4.  Patient will complete 12 weeks of PT with no reports of a fall  Baseline: last fall was a week ago  Goal status: INITIAL   ASSESSMENT:  CLINICAL IMPRESSION: Patient is a 73 y.o. female who was seen today for physical therapy evaluation and  treatment for gait abnormalities and balance. She reports that she has a lot of problems and she is sick. The last 2.5 years have been really bad and she feels that it is a constantly something new. Patient has a long list of problems in her phone that she read over and over for most of the evaluation and was persistent on explaining every issue she has had. She believes that doctors have dismissed her issues because she is elderly. Patient reports vision issues, chronic diarrhea, allergies to meds, chills, hx of falls, cancer, etc. Patient will benefit from PT to help witj her overall mobility and health to try to get her to be more functional and decrease her risk for falls.   OBJECTIVE IMPAIRMENTS: Abnormal gait, cardiopulmonary status limiting activity, decreased activity tolerance, decreased balance, decreased coordination, decreased endurance, difficulty walking, decreased strength, improper body mechanics, postural dysfunction, and pain.   ACTIVITY LIMITATIONS: bending, squatting, stairs, transfers, and locomotion level  PARTICIPATION LIMITATIONS: cleaning, laundry, shopping, community activity, and yard work  PERSONAL FACTORS: Age, Behavior pattern, Past/current experiences, Time since onset of injury/illness/exacerbation, and 3+ comorbidities: DM, dementia, cancer, fibromyalgia, HTN, CKD, arthritis, IBS,   are also affecting patient's functional outcome.   REHAB POTENTIAL: Good  CLINICAL DECISION MAKING: Evolving/moderate complexity  EVALUATION COMPLEXITY: Moderate  PLAN:  PT FREQUENCY: 1x/week  PT DURATION: 12 weeks  PLANNED INTERVENTIONS: 97110-Therapeutic exercises, 97530- Therapeutic activity, [id number]- Neuromuscular re-education, 97535- Self Care, [id number]- Manual therapy, [phone]- Gait training, Patient/Family education, Balance training, Stair training, Taping, Dry Needling, Joint mobilization, Spinal mobilization, Cryotherapy, and Moist heat  PLAN FOR NEXT SESSION: start with any  functional activity to get her moving, general strengthening and balance    Cassie Freer, PT 03/11/2024, 11:43 AM

## 2024-03-11 ENCOUNTER — Ambulatory Visit: Attending: Family Medicine

## 2024-03-11 DIAGNOSIS — R2689 Other abnormalities of gait and mobility: Secondary | ICD-10-CM | POA: Insufficient documentation

## 2024-03-11 DIAGNOSIS — R296 Repeated falls: Secondary | ICD-10-CM | POA: Insufficient documentation

## 2024-03-11 DIAGNOSIS — R269 Unspecified abnormalities of gait and mobility: Secondary | ICD-10-CM | POA: Diagnosis not present

## 2024-03-11 DIAGNOSIS — M6281 Muscle weakness (generalized): Secondary | ICD-10-CM | POA: Insufficient documentation

## 2024-03-11 DIAGNOSIS — R2681 Unsteadiness on feet: Secondary | ICD-10-CM | POA: Insufficient documentation

## 2024-03-11 DIAGNOSIS — R293 Abnormal posture: Secondary | ICD-10-CM | POA: Insufficient documentation

## 2024-03-11 DIAGNOSIS — R262 Difficulty in walking, not elsewhere classified: Secondary | ICD-10-CM | POA: Insufficient documentation

## 2024-03-11 DIAGNOSIS — R278 Other lack of coordination: Secondary | ICD-10-CM | POA: Diagnosis not present

## 2024-03-16 ENCOUNTER — Ambulatory Visit (INDEPENDENT_AMBULATORY_CARE_PROVIDER_SITE_OTHER): Admitting: Family Medicine

## 2024-03-16 ENCOUNTER — Encounter: Payer: Self-pay | Admitting: Family Medicine

## 2024-03-16 VITALS — BP 162/80 | HR 85 | Temp 97.5°F | Resp 18 | Ht 62.0 in | Wt 184.0 lb

## 2024-03-16 DIAGNOSIS — R600 Localized edema: Secondary | ICD-10-CM

## 2024-03-16 DIAGNOSIS — E538 Deficiency of other specified B group vitamins: Secondary | ICD-10-CM | POA: Diagnosis not present

## 2024-03-16 DIAGNOSIS — D638 Anemia in other chronic diseases classified elsewhere: Secondary | ICD-10-CM | POA: Diagnosis not present

## 2024-03-16 DIAGNOSIS — G250 Essential tremor: Secondary | ICD-10-CM

## 2024-03-16 DIAGNOSIS — Z139 Encounter for screening, unspecified: Secondary | ICD-10-CM | POA: Diagnosis not present

## 2024-03-16 DIAGNOSIS — I10 Essential (primary) hypertension: Secondary | ICD-10-CM | POA: Diagnosis not present

## 2024-03-16 DIAGNOSIS — I2510 Atherosclerotic heart disease of native coronary artery without angina pectoris: Secondary | ICD-10-CM

## 2024-03-16 DIAGNOSIS — E1165 Type 2 diabetes mellitus with hyperglycemia: Secondary | ICD-10-CM | POA: Diagnosis not present

## 2024-03-16 MED ORDER — CYANOCOBALAMIN 1000 MCG/ML IJ SOLN: 1000.0000 ug | Freq: Once | INTRAMUSCULAR | Status: DC

## 2024-03-16 NOTE — Progress Notes (Signed)
 "  Subjective:     Patient ID: [name], female    DOB: October 15, 1951, 73 y.o.   MRN: [id number]  Chief Complaint  Patient presents with   Medical Management of Chronic Issues    4 week follow-up Concerned with weight gain    HPI Discussed the use of AI scribe software for clinical note transcription with the patient, who gave verbal consent to proceed.  History of Present Illness [name] is a 73 year old female who presents with medication management concerns and worsening symptoms.  She has ongoing issues with medication management, having stopped Lexapro  and other antidepressants due to feeling worse. She is frustrated with the number of medications prescribed by various doctors. She continues to take Synthroid  50 mcg daily, potassium twice a day, and torsemide  20 mg every other day for swelling, although she feels worse on torsemide . She has also stopped taking Zenpep  and pantoprazole  due to adverse effects.has MULTIPLE drug sensitivities.    She experiences persistent diarrhea, which is not as severe as before, and occasional chest pain, which she attributes to reflux since discontinuing pantoprazole . She has a history of seeing a cardiologist, but her previous cardiologist retired, and she has not established care with a new one. She has protein in her urine and fluctuating blood pressure, which she attributes to medication sensitivities.  She reports a prolapsed vagina, noting that the vaginal ring fell out and was found in the commode. She has not been using the vaginal ring since then. She continues to take Repatha  every two weeks.  She has a history of anemia and has seen a hematologist, but has not received follow-up regarding potential iron infusions. She is concerned about allergies to infusions and has not been contacted for further management.  She faces significant stress and financial difficulties, including challenges with paying taxes and utilities,  although she receives some assistance from [employer]. She is not at risk of running out of food.    Health Maintenance Due  Topic Date Due   OPHTHALMOLOGY EXAM  Never done   Zoster Vaccines- Shingrix (2 of 2) 02/23/2021   Diabetic kidney evaluation - Urine ACR  01/24/2024    Past Medical History:  Diagnosis Date   Allergy    Anemia    Arthritis    Asthma    Dianosed years ago-no meds at this time   Cancer [facility]) 2021   endometrial    Cataract    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 name of MD    Dementia (HCC)    Diabetes mellitus    type 2    Diverticulosis    Dyslipidemia    Dyspnea    with exertion    Dysrhythmia    skips beats   Endometrial cancer (HCC) 2021   Family history of adverse reaction to anesthesia    son stopped breathing during surgery , mother slow to wake up    Fibromyalgia 1980's   Generalized anxiety disorder    GERD (gastroesophageal reflux disease)    occ- will use Tums and Prolosec  prn    Heart murmur    Hyperlipemia    Hypertension    not on blood pressure meds due to allergies - last dose of meds- months ago    Hypothyroidism    Hypothyroidism (acquired)  Internal hemorrhoids    Irritable bowel syndrome 1980's   Liver cancer (HCC)    Major depressive disorder    Migraine headaches 1980's   On meds-well controlled   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   Morbid obesity (HCC)    Pernicious anemia    Pneumonia 2009   Several times over the past several years   PONV (postoperative nausea and vomiting)    was told by neurologist due to calcificaton in brain not to be put to sleep, N/V during Op and Recovery    Recurrent upper respiratory infection (URI)    Sleep apnea    no cpap   Subarachnoid hemorrhage (HCC) 2010   Vaginal polyp    benign per pt    Past Surgical History:  Procedure Laterality  Date   BIOPSY  01/07/2024   Procedure: BIOPSY;  Surgeon: Shila Gustav GAILS, MD;  Location: [facility];  Service: Gastroenterology;;   CATARACT EXTRACTION Right    CHOLECYSTECTOMY  1983   COLONOSCOPY  2008   DB   COLONOSCOPY WITH PROPOFOL  N/A 01/07/2024   Procedure: COLONOSCOPY WITH PROPOFOL ;  Surgeon: Shila Gustav GAILS, MD;  Location: [facility];  Service: Gastroenterology;  Laterality: N/A;   DILATION AND CURETTAGE OF UTERUS  1972   ESOPHAGOGASTRODUODENOSCOPY (EGD) WITH PROPOFOL  N/A 01/07/2024   Procedure: ESOPHAGOGASTRODUODENOSCOPY (EGD) WITH PROPOFOL ;  Surgeon: Shila Gustav GAILS, MD;  Location: [facility];  Service: Gastroenterology;  Laterality: N/A;   HYSTEROSCOPY WITH D & C  06/29/2011   Procedure: DILATATION AND CURETTAGE (D&C) /HYSTEROSCOPY;  Surgeon: Glenys GORMAN Birk, MD;  Location: [facility];  Service: Gynecology;  Laterality: N/A;   ROBOTIC ASSISTED TOTAL HYSTERECTOMY WITH BILATERAL SALPINGO OOPHERECTOMY N/A 10/27/2020   Procedure: XI ROBOTIC ASSISTED TOTAL HYSTERECTOMY WITH BILATERAL SALPINGO OOPHORECTOMY;  Surgeon: Eloy Herring, MD;  Location: [facility];  Service: Gynecology;  Laterality: N/A;   SENTINEL NODE BIOPSY N/A 10/27/2020   Procedure: SENTINEL NODE BIOPSY;  Surgeon: Eloy Herring, MD;  Location: [facility];  Service: Gynecology;  Laterality: N/A;     Current Outpatient Medications:    aspirin  EC 81 MG tablet, Take 81 mg by mouth daily. Swallow whole., Disp: , Rfl:    Cholecalciferol  (VITAMIN D3) 25 MCG (1000 UT) capsule, Take 1,000 Units by mouth daily., Disp: , Rfl:    Evolocumab  (REPATHA  SURECLICK) 140 MG/ML SOAJ, INJECT 1 PEN SUBCUTANEOUSLY EVERY 14 DAYS, Disp: 6 mL, Rfl: 3   glucose blood (ACCU-CHEK GUIDE) test strip, Use to test blood sugar 3 times daily (Patient taking differently: 1 each as needed. Use to test blood sugar 3 times daily), Disp: 100 each, Rfl: 2   levothyroxine  (SYNTHROID ) 50 MCG tablet, TAKE 1 TABLET BY MOUTH EVERY DAY BEFORE BREAKFAST, Disp: 90 tablet,  Rfl: 3   MAGNESIUM  CITRATE PO, Take 1 tablet by mouth 2 (two) times daily., Disp: , Rfl:    Multiple Vitamin (MULTIVITAMIN WITH MINERALS) TABS tablet, Take 1 tablet by mouth daily., Disp: 30 tablet, Rfl: 0   potassium chloride  SA (KLOR-CON  M) 20 MEQ tablet, TAKE 1 TABLET BY MOUTH 2 TIMES DAILY, Disp: 180 tablet, Rfl: 1   torsemide  (DEMADEX ) 20 MG tablet, TAKE 1 TABLET BY MOUTH ONCE DAILY (Patient taking differently: Take 20 mg by mouth daily.), Disp: 90 tablet, Rfl: 1  Current Facility-Administered Medications:    cyanocobalamin  (VITAMIN B12) injection 1,000 mcg, 1,000 mcg, Intramuscular, Q30 days, Wendolyn Jenkins Jansky, MD, 1,000 mcg at 03/16/24 1051  Allergies  Allergen Reactions   Atenolol Other (See Comments)  Stomach problems, chills   Atorvastatin Other (See Comments)    Retained fluid   Canagliflozin Other (See Comments)    ALL DIABETIC MEDS PER PT   abdominal pain, headache   Losartan Potassium Other (See Comments)    chills, HA, stomach pain   Acarbose Nausea Only and Other (See Comments)     headache,nausea   Cinnamon Other (See Comments)    Stomach pain   Empagliflozin Other (See Comments)     headaches   Glimepiride Other (See Comments)    ALL DIABETIC MEDS PER PT  abdominal pain, headache   Metformin And Related Nausea And Vomiting and Other (See Comments)    Patient states that she has severe chills, headache and cramping additionally. Says blood sugar is uncontrolled because of this   Other Other (See Comments)    ALL DIABETIC MEDS PER PT    Pioglitazone Other (See Comments)    ALL DIABETIC MEDS PER PT = peripheral edema   Pravastatin Other (See Comments)    Retained fluid   Sitagliptin Other (See Comments)    ALL DIABETIC MEDS PER PT  Other reaction(s): headache   Spironolactone-Hctz Other (See Comments)   Statins Other (See Comments)    Myalgia   Sulfamethoxazole Other (See Comments)    ALL DIABETIC MEDS PER PT    Tramadol  Hcl Itching   Atorvastatin Calcium   Other (See Comments)     retain fld   Augmentin  [Amoxicillin -Pot Clavulanate] Other (See Comments)    Caused jaundice   Crestor [Rosuvastatin] Other (See Comments)   Rosuvastatin Calcium     Torsemide  Nausea Only    Gi upset, chills, abd pain,bloating   Bumex  [Bumetanide ] Rash and Other (See Comments)    Chills, rash, headaches, stomach issues also   Lasix  [Furosemide ] Rash and Other (See Comments)    Sugars increased   Sulfa Antibiotics Rash and Other (See Comments)    Mother has told patient in the past not to take, she cannot recall there reaction   ROS neg/noncontributory except as noted HPI/below      Objective:     BP (!) 162/80 (BP Location: Left Arm, Patient Position: Sitting, Cuff Size: Normal)   Pulse 85   Temp (!) 97.5 F (36.4 C) (Temporal)   Resp 18   Ht 5' 2 (1.575 m)   Wt 184 lb (83.5 kg)   SpO2 100%   BMI 33.65 kg/m  Wt Readings from Last 3 Encounters:  03/16/24 184 lb (83.5 kg)  02/28/24 192 lb 9.6 oz (87.4 kg)  02/17/24 184 lb (83.5 kg)    Physical Exam   Gen: WDWN NAD HEENT: NCAT, conjunctiva not injected, sclera nonicteric NECK:  supple, no thyromegaly, no nodes, no carotid bruits CARDIAC: RRR, S1S2+, no murmur. DP 2+B LUNGS: CTAB. No wheezes ABDOMEN:  BS+, soft, NTND, No HSM, no masses EXT:  no edema MSK: no gross abnormalities.  NEURO: A&O x3.  CN II-XII intact.  PSYCH: normal mood. Good eye contact     Assessment & Plan:  Coronary artery disease involving native coronary artery of native heart without angina pectoris -     AMB Referral [facility] Care Management  Anemia, chronic disease  Controlled type 2 diabetes mellitus with hyperglycemia, without long-term current use of insulin  (HCC) -     AMB Referral [facility] Care Management  Essential hypertension -     AMB Referral [facility] Care Management  Local edema -     AMB Referral [facility] Care Management  B12 deficiency  Essential tremor  Encounter for screening involving social determinants  of health (SDoH) -     AMB Referral [facility]I Care Management  Assessment and Plan Assessment & Plan Depression   She is not taking Lexapro  or any antidepressants due to adverse effects and frustration with medication management. She desires an antidepressant but has not pursued further consultation.  Hypertension   She is not taking antihypertensive medication due to sensitivity and allergies. Her blood pressure fluctuates, making management difficult without medication.  Diabetes Mellitus   She is not taking medication for diabetes since intolerant of all  Chronic kidney disease   She has proteinuria and cannot take medications to protect her kidneys or control blood pressure due to allergies and sensitivities, presenting challenges in management.has seen neph in past  Edema   She experiences swelling and takes torsemide  20 mg every other day, reporting feeling worse on medication days. Adjust torsemide  frequency to every third or fourth day based on fluid buildup to improve well-being.  Anemia   She has anemia with fluctuating levels and no follow-up from hematology/oncology regarding iron infusions. There is concern about potential allergies to infusions, and her ferritin level may not indicate the need for infusion. Pt advised to Contact hematology/oncology for follow-up on the iron infusion plan.  Gastroesophageal reflux disease (GERD)   She experiences chest pain possibly related to reflux and is not taking pantoprazole , reporting feeling better off medication despite chest pain.  Exocrine pancreatic insufficiency   She stopped taking Zenpep  due to feeling worse and has informed healthcare providers of her decision.  Vaginal prolapse   Her vaginal area has dropped more, and the vaginal ring fell out. She is not using the vaginal ring currently.  Hyperlipidemia   She is taking Repatha  every two weeks for hyperlipidemia. Continue Repatha  every two weeks.  Hypothyroidism   She is  taking Synthroid  50 mcg for hypothyroidism. Continue Synthroid  50 mcg.  Vitamin B12 deficiency   She is receiving monthly B12 injections with long-term treatment and unclear subjective benefit. Continue monthly B12 injections.  Social and financial concerns   She is experiencing financial difficulties, including trouble paying taxes and utilities. She is not at risk of losing her house but is stressed by these issues. Coordinate with social services to explore available resources for financial assistance.  Follow-up   She needs follow-up with cardiology due to chest pain and with hematology/oncology for anemia management. Refer to cardiology for evaluation of chest pain and contact hematology/oncology for follow-up on anemia management.    Return in about 4 weeks (around 04/13/2024) for chronic follow-up.  Jenkins CHRISTELLA Carrel, MD  "

## 2024-03-16 NOTE — Patient Instructions (Signed)
 Call oncology about the iron

## 2024-03-18 ENCOUNTER — Ambulatory Visit

## 2024-03-19 ENCOUNTER — Telehealth: Payer: Self-pay | Admitting: *Deleted

## 2024-03-19 NOTE — Progress Notes (Signed)
 Care Guide Pharmacy Note  03/19/2024 Name: [name] MRN: [id number] DOB: 1951/11/10  Referred By: Jeani Sow, MD Reason for referral: Care Coordination (Outreach to schedule referral with pharmacist )   [name] is a 73 y.o. year old female who is a primary care patient of Jeani Sow, MD.  [name] was referred to the pharmacist for assistance related to: DMII  An unsuccessful telephone outreach was attempted today to contact the patient who was referred to the pharmacy team for assistance with medication management. Additional attempts will be made to contact the patient.  [name], CMA [place]  [facility], [facility] Guide Direct Dial: [phone]  Fax: [phone] Website: [email or web address]

## 2024-03-20 NOTE — Progress Notes (Signed)
 Care Guide Pharmacy Note  03/20/2024 Name: [name] MRN: [id number] DOB: 09/11/1951  Referred By: Jeani Sow, MD Reason for referral: Care Coordination (Outreach to schedule referral with pharmacist )   [name] is a 73 y.o. year old female who is a primary care patient of Jeani Sow, MD.  [name] was referred to the pharmacist for assistance related to: DMII  Pt requested to check her schedule and call back   [name], CMA [facility]  [facility], [facility] Guide Direct Dial: [phone]  Fax: [phone] Website: [email or web address]

## 2024-03-23 ENCOUNTER — Ambulatory Visit: Admitting: Physical Therapy

## 2024-03-23 NOTE — Progress Notes (Signed)
 Care Guide Pharmacy Note  03/23/2024 Name: [name] MRN: [id number] DOB: 10-11-1951  Referred By: Jeani Sow, MD Reason for referral: Care Coordination (Outreach to schedule referral with pharmacist )   [name] is a 73 y.o. year old female who is a primary care patient of Jeani Sow, MD.  [name] was referred to the pharmacist for assistance related to: DMII  Successful contact was made with the patient to discuss pharmacy services.  Patient declines engagement at this time. Contact information was provided to the patient should they wish to reach out for assistance at a later time.  [name], CMA [place]  [facility], [facility] Guide Direct Dial: [phone]  Fax: [phone] Website: [email or web address]

## 2024-04-01 ENCOUNTER — Ambulatory Visit: Admitting: Physical Therapy

## 2024-04-06 ENCOUNTER — Other Ambulatory Visit (HOSPITAL_COMMUNITY)
Admission: RE | Admit: 2024-04-06 | Discharge: 2024-04-06 | Disposition: A | Discharge status: Home / Self Care | Attending: Obstetrics and Gynecology | Admitting: Obstetrics and Gynecology

## 2024-04-06 ENCOUNTER — Encounter: Payer: Self-pay | Admitting: Obstetrics and Gynecology

## 2024-04-06 ENCOUNTER — Ambulatory Visit (INDEPENDENT_AMBULATORY_CARE_PROVIDER_SITE_OTHER): Admitting: Obstetrics and Gynecology

## 2024-04-06 VITALS — BP 160/81 | HR 98

## 2024-04-06 DIAGNOSIS — N8110 Cystocele, unspecified: Secondary | ICD-10-CM

## 2024-04-06 DIAGNOSIS — N813 Complete uterovaginal prolapse: Secondary | ICD-10-CM

## 2024-04-06 DIAGNOSIS — R350 Frequency of micturition: Secondary | ICD-10-CM | POA: Insufficient documentation

## 2024-04-06 DIAGNOSIS — N993 Prolapse of vaginal vault after hysterectomy: Secondary | ICD-10-CM

## 2024-04-06 DIAGNOSIS — N952 Postmenopausal atrophic vaginitis: Secondary | ICD-10-CM

## 2024-04-06 DIAGNOSIS — R319 Hematuria, unspecified: Secondary | ICD-10-CM

## 2024-04-06 LAB — URINALYSIS, ROUTINE W REFLEX MICROSCOPIC
Bacteria, UA: NONE SEEN
Bilirubin Urine: NEGATIVE
Glucose, UA: NEGATIVE mg/dL
Ketones, ur: NEGATIVE mg/dL
Leukocytes,Ua: NEGATIVE
Nitrite: NEGATIVE
Protein, ur: 100 mg/dL — AB
Specific Gravity, Urine: 1.015 (ref 1.005–1.030)
pH: 5 (ref 5.0–8.0)

## 2024-04-06 LAB — POCT URINALYSIS DIPSTICK
Bilirubin, UA: NEGATIVE
Glucose, UA: NEGATIVE
Ketones, UA: NEGATIVE
Leukocytes, UA: NEGATIVE
Nitrite, UA: NEGATIVE
Protein, UA: POSITIVE — AB
Spec Grav, UA: 1.02 (ref 1.010–1.025)
Urobilinogen, UA: 0.2 U/dL
pH, UA: 5.5 (ref 5.0–8.0)

## 2024-04-06 MED ORDER — ESTRADIOL 0.1 MG/GM VA CREA: 0.5000 g | TOPICAL_CREAM | VAGINAL | 11 refills | Status: DC

## 2024-04-06 NOTE — Progress Notes (Signed)
 [facility]   Subjective:     Chief Complaint:  Chief Complaint  Patient presents with   Pessary Check    [name] is a 73 y.o. female is here for pessary check.   History of Present Illness: [name] is a 73 y.o. female with stage II pelvic organ prolapse who presents for a pessary check. She is using a size #4 ring with support pessary. The pessary has not been working well and she reports it was causing significant pain and then fell out. She reports the prolapse is significantly pushing out and makes her feel she cannot empty her bladder.   Past Medical History: Patient  has a past medical history of Allergy, Anemia, Arthritis, Asthma, Cancer (HCC) (2021), Cataract, Chronic kidney disease (1997), Dementia (HCC), Diabetes mellitus, Diverticulosis, Dyslipidemia, Dyspnea, Dysrhythmia, Endometrial cancer (HCC) (2021), Family history of adverse reaction to anesthesia, Fibromyalgia (1980's), Generalized anxiety disorder, GERD (gastroesophageal reflux disease), Heart murmur, Hyperlipemia, Hypertension, Hypothyroidism, Hypothyroidism (acquired), Internal hemorrhoids, Irritable bowel syndrome (1980's), Liver cancer (HCC), Major depressive disorder, Migraine headaches (1980's), Mild neurocognitive disorder (10/05/2019), Morbid obesity (HCC), Pernicious anemia, Pneumonia (2009), PONV (postoperative nausea and vomiting), Recurrent upper respiratory infection (URI), Sleep apnea, Subarachnoid hemorrhage (HCC) (2010), and Vaginal polyp.   Past Surgical History: She  has a past surgical history that includes Dilation and curettage of uterus (1972); Cholecystectomy (1983); Hysteroscopy with D & C (06/29/2011); Colonoscopy (2008); Robotic assisted total hysterectomy with bilateral salpingo oophorectomy (N/A, 10/27/2020); Sentinel node biopsy (N/A, 10/27/2020); Cataract extraction (Right); Colonoscopy with propofol  (N/A, 01/07/2024); Esophagogastroduodenoscopy (egd) with propofol   (N/A, 01/07/2024); and biopsy (01/07/2024).   Medications: She has a current medication list which includes the following prescription(s): aspirin  ec, vitamin d3, estradiol , repatha  sureclick, accu-chek guide, levothyroxine , magnesium  citrate, multivitamin with minerals, potassium chloride  sa, and torsemide , and the following Facility-Administered Medications: cyanocobalamin .   Allergies: Patient is allergic to atenolol, atorvastatin, canagliflozin, losartan potassium, acarbose, cinnamon, empagliflozin, glimepiride, metformin and related, other, pioglitazone, pravastatin, sitagliptin, spironolactone-hctz, statins, sulfamethoxazole, tramadol  hcl, atorvastatin calcium , augmentin  [amoxicillin -pot clavulanate], crestor [rosuvastatin], rosuvastatin calcium , torsemide , bumex  [bumetanide ], lasix  [furosemide ], and sulfa antibiotics.   Social History: Patient  reports that she has never smoked. She has never used smokeless tobacco. She reports that she does not currently use alcohol. She reports that she does not use drugs.      Objective:    Physical Exam: BP (!) 160/81   Pulse 98  Gen: No apparent distress, A&O x 3. Detailed Urogynecologic Evaluation:  Pelvic Exam: Normal external female genitalia; Bartholin's and Skene's glands normal in appearance; urethral meatus with caruncle, no urethral masses or discharge. Patient was re-fit with a #4 Short Stem Gellhorn pessary. Patient was able to urinate, walk around, and attempt defecation. Patient voided approximately and had a catheterized PVR of 25 with pessary in place.     Laboratory Results: Lab Results  Component Value Date   COLORU yellow 04/06/2024   CLARITYU clear 04/06/2024   GLUCOSEUR Negative 04/06/2024   BILIRUBINUR negative 04/06/2024   KETONESU negative 04/06/2024   SPECGRAV 1.020 04/06/2024   RBCUR Small 04/06/2024   PHUR 5.5 04/06/2024   PROTEINUR Positive (A) 04/06/2024   UROBILINOGEN 0.2 04/06/2024   LEUKOCYTESUR  Negative 04/06/2024    Assessment/Plan:    Assessment: [name] is a 73 y.o. with stage II pelvic organ prolapse here for a pessary check. She is doing well.  Plan: She will keep the pessary in place until next visit. She will continue to  use estrogen. She will follow-up in 3 months for a pessary check or sooner as needed.  All questions were answered.

## 2024-04-06 NOTE — Patient Instructions (Addendum)
 Please use the estrogen cream inside the vagina twice a week. It comes with an applicator. Please push the number up to the #1 and push it up inside the vagina like inserting a tampon.   If the pessary starts to hurt please call. If you see bleeding, please call. We will need yo see you in 3 months to take the pessary out and clean it and replace it.

## 2024-04-07 ENCOUNTER — Other Ambulatory Visit: Payer: Self-pay | Admitting: Obstetrics and Gynecology

## 2024-04-07 DIAGNOSIS — N952 Postmenopausal atrophic vaginitis: Secondary | ICD-10-CM

## 2024-04-08 ENCOUNTER — Ambulatory Visit: Admitting: Physical Therapy

## 2024-04-14 ENCOUNTER — Telehealth: Payer: Self-pay | Admitting: *Deleted

## 2024-04-14 ENCOUNTER — Telehealth: Payer: Self-pay

## 2024-04-14 ENCOUNTER — Encounter: Payer: Self-pay | Admitting: Family Medicine

## 2024-04-14 ENCOUNTER — Ambulatory Visit (INDEPENDENT_AMBULATORY_CARE_PROVIDER_SITE_OTHER): Admitting: Family Medicine

## 2024-04-14 VITALS — BP 139/71 | HR 86 | Temp 97.9°F | Resp 18 | Ht 62.0 in | Wt 191.5 lb

## 2024-04-14 DIAGNOSIS — N289 Disorder of kidney and ureter, unspecified: Secondary | ICD-10-CM

## 2024-04-14 DIAGNOSIS — R600 Localized edema: Secondary | ICD-10-CM

## 2024-04-14 DIAGNOSIS — E538 Deficiency of other specified B group vitamins: Secondary | ICD-10-CM

## 2024-04-14 MED ORDER — CYANOCOBALAMIN 1000 MCG/ML IJ SOLN: 1000.0000 ug | Freq: Once | INTRAMUSCULAR | Status: DC

## 2024-04-14 NOTE — Progress Notes (Signed)
 Complex Care Management Note  Care Guide Note 04/14/2024 Name: [name] MRN: [id number] DOB: Nov 08, 1951  [name] is a 73 y.o. year old female who sees Christel Cousins, MD for primary care. I reached out to Gennaro Khat by phone today to offer complex care management services.  Ms. Brettschneider was given information about Complex Care Management services today including:   The Complex Care Management services include support from the care team which includes your Nurse Care Manager, Clinical Social Worker, or Pharmacist.  The Complex Care Management team is here to help remove barriers to the health concerns and goals most important to you. Complex Care Management services are voluntary, and the patient may decline or stop services at any time by request to their care team member.   Complex Care Management Consent Status: Patient agreed to services and verbal consent obtained.   Follow up plan:  Telephone appointment with complex care management team member scheduled for:  04/24/2024  Encounter Outcome:  Patient Scheduled  Kandis Ormond, CMA [place]  [facility], [facility] Guide Direct Dial: [phone]  Fax: [phone] Website: [email or web address]

## 2024-04-14 NOTE — Progress Notes (Signed)
 Subjective:     Patient ID: [name], female    DOB: 1951-09-03, 73 y.o.   MRN: [id number]  Chief Complaint  Patient presents with   Medical Management of Chronic Issues    4 week follow-up Fasting     HPI Vag prolapse ?chf, cough at hs.  Neph 2 mo ago Discussed the use of AI scribe software for clinical note transcription with the patient, who gave verbal consent to proceed.  History of Present Illness [name] is a 73 year old female with stage three kidney disease who presents with worsening symptoms of a prolapsed vagina and fluid retention.  She describes her prolapsed vagina as significantly worsened, stating it 'totally fell out.' A physician assistant recently attempted to manually reduce the prolapse, but she was uncomfortable with this intervention. She is seeing a urogynecologist i  She is experiencing significant fluid retention, which she attributes to weight gain. Despite losing almost ten pounds recently by taking a diuretic daily and reducing food intake, she still notices fluid retention, particularly in her legs. She is currently taking torsemide  daily, which has helped reduce some weight but not completely. Her weight has fluctuated significantly, with recent weights ranging from 181 to 196 pounds. Intolerances of several meds.  Eats a lot of salt-shole life.   She reports a worsening cough and wheezing, especially at night, along with increased breathing difficulties. She also experiences nausea and has almost vomited a few times, which is unusual for her. Additionally, she reports burning during urination, although a recent test indicated no bladder infection. She has been using Estrace  cream, which helps with the burning sensation.  She has a history of stage three kidney disease and is concerned about the possibility of her kidneys being affected again due to the fluid retention. She is currently taking several medications, including torsemide ,  Synthroid , Repatha , and potassium supplements. She is also on vitamins and D3. 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 name of MD    Dementia (HCC)    Diabetes mellitus    type 2    Diverticulosis    Dyslipidemia    Dyspnea    with exertion    Dysrhythmia    "skips beats"   Endometrial cancer (HCC) 2021   Family history of adverse reaction to anesthesia    son stopped breathing during surgery , mother slow to wake up    Fibromyalgia 1980's   Generalized anxiety disorder    GERD (gastroesophageal reflux disease)    occ- will use Tums and Prolosec  prn    Heart murmur    Hyperlipemia    Hypertension    not on blood pressure meds due to allergies - last dose of meds- months ago    Hypothyroidism    Hypothyroidism (acquired)    Internal hemorrhoids  Irritable bowel syndrome 1980's   Liver cancer (HCC)    Major depressive disorder    Migraine headaches 1980's   On meds-well controlled   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   Morbid obesity (HCC)    Pernicious anemia    Pneumonia 2009   Several times over the past several years   PONV (postoperative nausea and vomiting)    was told by neurologist due to calcificaton in brain not to be put to sleep, N/V during Op and Recovery    Recurrent upper respiratory infection (URI)    Sleep apnea    no cpap   Subarachnoid hemorrhage (HCC) 2010   Vaginal polyp    benign per pt    Past Surgical History:  Procedure  Laterality Date   BIOPSY  01/07/2024   Procedure: BIOPSY;  Surgeon: Sergio Dandy, MD;  Location: [facility];  Service: Gastroenterology;;   CATARACT EXTRACTION Right    CHOLECYSTECTOMY  1983   COLONOSCOPY  2008   DB   COLONOSCOPY WITH PROPOFOL  N/A 01/07/2024   Procedure: COLONOSCOPY WITH PROPOFOL ;  Surgeon: Sergio Dandy, MD;  Location: [facility];  Service: Gastroenterology;  Laterality: N/A;   DILATION AND CURETTAGE OF UTERUS  1972   ESOPHAGOGASTRODUODENOSCOPY (EGD) WITH PROPOFOL  N/A 01/07/2024   Procedure: ESOPHAGOGASTRODUODENOSCOPY (EGD) WITH PROPOFOL ;  Surgeon: Sergio Dandy, MD;  Location: [facility];  Service: Gastroenterology;  Laterality: N/A;   HYSTEROSCOPY WITH D & C  06/29/2011   Procedure: DILATATION AND CURETTAGE (D&C) /HYSTEROSCOPY;  Surgeon: Granville Layer, MD;  Location: [facility];  Service: Gynecology;  Laterality: N/A;   ROBOTIC ASSISTED TOTAL HYSTERECTOMY WITH BILATERAL SALPINGO OOPHERECTOMY N/A 10/27/2020   Procedure: XI ROBOTIC ASSISTED TOTAL HYSTERECTOMY WITH BILATERAL SALPINGO OOPHORECTOMY;  Surgeon: Alphonso Aschoff, MD;  Location: [facility];  Service: Gynecology;  Laterality: N/A;   SENTINEL NODE BIOPSY N/A 10/27/2020   Procedure: SENTINEL NODE BIOPSY;  Surgeon: Alphonso Aschoff, MD;  Location: [facility];  Service: Gynecology;  Laterality: N/A;     Current Outpatient Medications:    aspirin  EC 81 MG tablet, Take 81 mg by mouth daily. Swallow whole., Disp: , Rfl:    Cholecalciferol  (VITAMIN D3) 25 MCG (1000 UT) capsule, Take 1,000 Units by mouth daily., Disp: , Rfl:    estradiol  (ESTRACE ) 0.1 MG/GM vaginal cream, Place 0.5 g vaginally 2 (two) times a week. Place 1g twice a week, Disp: 42.5 g, Rfl: 11   Evolocumab  (REPATHA  SURECLICK) 140 MG/ML SOAJ, INJECT 1 PEN SUBCUTANEOUSLY EVERY 14 DAYS, Disp: 6 mL, Rfl: 3   glucose blood (ACCU-CHEK GUIDE) test strip, Use to test blood sugar 3 times daily (Patient taking differently: 1 each as needed. Use to test blood sugar 3  times daily), Disp: 100 each, Rfl: 2   levothyroxine  (SYNTHROID ) 50 MCG tablet, TAKE 1 TABLET BY MOUTH EVERY DAY BEFORE BREAKFAST, Disp: 90 tablet, Rfl: 3   MAGNESIUM  CITRATE PO, Take 1 tablet by mouth 2 (two) times daily., Disp: , Rfl:    Multiple Vitamin (MULTIVITAMIN WITH MINERALS) TABS tablet, Take 1 tablet by mouth daily., Disp: 30 tablet, Rfl: 0   potassium chloride  SA (KLOR-CON  M) 20 MEQ tablet, TAKE 1 TABLET BY MOUTH 2 TIMES DAILY, Disp: 180 tablet, Rfl: 1   torsemide  (DEMADEX ) 20 MG tablet, TAKE 1 TABLET BY MOUTH ONCE DAILY (Patient taking differently: Take 20 mg by mouth daily.), Disp: 90 tablet, Rfl: 1  Current Facility-Administered Medications:    cyanocobalamin  (VITAMIN B12) injection 1,000 mcg, 1,000 mcg, Intramuscular, Q30 days,  Christel Cousins, MD, 1,000 mcg at 04/14/24 1154   cyanocobalamin  (VITAMIN B12) injection 1,000 mcg, 1,000 mcg, Intramuscular, Once, Christel Cousins, MD  Allergies  Allergen Reactions   Atenolol Other (See Comments)    Stomach problems, chills   Atorvastatin Other (See Comments)    Retained fluid   Canagliflozin Other (See Comments)    ALL DIABETIC MEDS PER PT   abdominal pain, headache   Losartan Potassium Other (See Comments)    chills, HA, stomach pain   Acarbose Nausea Only and Other (See Comments)     headache,nausea   Cinnamon Other (See Comments)    Stomach pain   Empagliflozin Other (See Comments)     headaches   Glimepiride Other (See Comments)    ALL DIABETIC MEDS PER PT  abdominal pain, headache   Metformin And Related Nausea And Vomiting and Other (See Comments)    Patient states that she has severe chills, headache and cramping additionally. Says blood sugar is uncontrolled because of this   Other Other (See Comments)    ALL DIABETIC MEDS PER PT    Pioglitazone Other (See Comments)    ALL DIABETIC MEDS PER PT = peripheral edema   Pravastatin Other (See Comments)    Retained fluid   Sitagliptin Other (See Comments)    ALL  DIABETIC MEDS PER PT  Other reaction(s): headache   Spironolactone-Hctz Other (See Comments)   Statins Other (See Comments)    Myalgia   Sulfamethoxazole Other (See Comments)    ALL DIABETIC MEDS PER PT    Tramadol  Hcl Itching   Atorvastatin Calcium  Other (See Comments)     retain fld   Augmentin  [Amoxicillin -Pot Clavulanate] Other (See Comments)    Caused jaundice   Crestor [Rosuvastatin] Other (See Comments)   Rosuvastatin Calcium     Torsemide  Nausea Only    Gi upset, chills, abd pain,bloating   Bumex  [Bumetanide ] Rash and Other (See Comments)    Chills, rash, headaches, stomach issues also   Lasix  [Furosemide ] Rash and Other (See Comments)    Sugars increased   Sulfa Antibiotics Rash and Other (See Comments)    Mother has told patient in the past not to take, she cannot recall there reaction   ROS neg/noncontributory except as noted HPI/below      Objective:     BP 139/71   Pulse 86   Temp 97.9 F (36.6 C) (Temporal)   Resp 18   Ht 5\' 2"  (1.575 m)   Wt 191 lb 8 oz (86.9 kg)   SpO2 98%   BMI 35.03 kg/m  Wt Readings from Last 3 Encounters:  04/14/24 191 lb 8 oz (86.9 kg)  03/16/24 184 lb (83.5 kg)  02/28/24 192 lb 9.6 oz (87.4 kg)    Physical Exam   Gen: WDWN NAD HEENT: NCAT, conjunctiva not injected, sclera nonicteric NECK:  supple, no thyromegaly, no nodes, no carotid bruits CARDIAC: RRR, S1S2+, no murmur.  LUNGS: CTAB. No wheezes ABDOMEN:  BS+, soft, NTND, No HSM, no masses EXT: 1+ edema MSK: no gross abnormalities.  NEURO: A&O x3.  CN II-XII intact.  PSYCH: normal mood. Good eye contact     Assessment & Plan:  B12 deficiency -     Cyanocobalamin   Local edema  Kidney disease  Assessment and Plan Assessment & Plan Heart failure   Possible heart failure is suspected due to fluid retention and weight gain, with symptoms of cough, wheezing, and nocturnal dyspnea. There has been no recent cardiology follow-up  since her previous cardiologist left.  An echocardiogram from a year ago showed stability. She is concerned about the impact of fluid retention on her weight. Schedule an appointment with a cardiologist for further evaluation of heart failure and fluid retention.  Fluid retention   Significant fluid retention is contributing to weight gain, with recent fluctuations. Torsemide  is used daily but is not fully effective and gets side effects.. She is worried about the impact of fluid retention on her weight and overall health. Continue daily Torsemide  and regularly monitor weight and fluid status. DECREASE SALT  Chronic kidney disease stage 3   She has chronic kidney disease stage 3 with proteinuria and is concerned about potential worsening of kidney function due to increased fluid retention. Recent kidney function tests indicated stability. Monitor kidney function regularly and consider referral to a nephrologist if kidney function worsens.  Vaginal prolapse   Severe vaginal prolapse was recently managed by a PA who repositioned it. Symptoms include dysuria, which improved with Estrace  cream. She is concerned about the prolapse affecting bladder function and prefers the PA's approach after a painful visit to a urogynecologist. Continue Estrace  cream to manage symptoms and consider follow-up with a urogynecologist if symptoms persist or worsen.    Return in about 4 weeks (around 05/12/2024) for chronic follow-up.  Ellsworth Haas, MD

## 2024-04-14 NOTE — Patient Instructions (Addendum)
 Possibly Staci from [facility]-for resources, etc.  Possibly nurse management.   Kandis Ormond, CMA [place]  [facility], [facility] Guide Direct Dial: [phone]

## 2024-04-14 NOTE — Telephone Encounter (Signed)
 I spoke to [name] yesterday, she is still complaining of vaginal burning and discomfort. She indicates she has been using her Estradiol  cream, However per her pharmacy needing to check the order before filling she hasn't picked up her Estradiol  since 01-2023. [name] said she needs to continue the cream for several weeks to see improvement.   Left a voicemail to return call.

## 2024-04-15 ENCOUNTER — Ambulatory Visit: Admitting: Physical Therapy

## 2024-04-23 ENCOUNTER — Ambulatory Visit: Payer: Self-pay

## 2024-04-23 NOTE — Telephone Encounter (Signed)
 Copied from CRM [id number]. Topic: Clinical - Red Word Triage >> Apr 23, 2024  1:12 PM [place] A wrote: [facility] that prompted transfer to Nurse Triage: Patient fell lastnight in the kitchen hit a head on the oven window and shattered glass. EMS was called but she declined to go to hospital.  Chief Complaint: head injury Symptoms: dizziness causing fall, hard impact to back of head, worsening 5/10, "slight" cut on head, some neck pain now Frequency: continual Pertinent Negatives: Patient denies LOC, vision changes, immediate neck pain after injury, confusion, bruising Disposition: [] 911 / [] ED /[] Urgent Care (no appt availability in office) / [] Appointment(In office/virtual)/ []  [facility]/ [] Home Care/ [x] Refused Recommended Disposition /[] [facility]/ []  Follow-up with PCP Additional Notes: Pt reporting that she had a bout of dizziness, as she states she's had for the past few years, last night and fell backwards, hit back of her head on oven really hard and broke all of the oven glass, someone else called ambulance, EMS came but pt refused going to hospital. Pt reporting that she did not feel neck pain last night but does feel some today, confirms she has a "slight cut" on the back of her head but "don't think" still bleeding. Pt confirms that she did not lose consciousness last night, but head "inside really does hurt," hurts "all over" head despite taking tylenol , 5/10 pain and worsening, constant pain that worsens intermittently. Pt reporting that she has "fallen before with severe head injury" years ago for which she had a 1-week hospital stay. Pt confirms she is "more unsteady" now after injury than usual. Advised pt be examined in ED, pt refusing, alerted CAL to situation, CAL advising ED as well, informed pt that PCP office including nursing supervisor specifically wanting pt to go to ED, pt refusing to go "unless I'm dying," pt upset with healthcare system. Advised pt go to  ED where able to care for severity of injury, offered Patient Experience line, pt refused, pt hung up.  Reason for Disposition  Sounds like a serious injury to the triager  Answer Assessment - Initial Assessment Questions 1. MECHANISM: "How did the injury happen?" For falls, ask: "What height did you fall from?" and "What surface did you fall against?"      Do have problems with dizziness better the past couple years, got dizzy, fell backwards, hit head on oven really hard and broke all of the oven glass, shattered all over, someone else called ambulance and EMS came and stayed briefly, asked if wanted to go to hospital and I said not really, BP was about same that it's been with doctors and so on, don't think anything dilated Head inside really does hurt, been taking tylenol , went to bed last night and was slight cut on head Did not have LOC 3. NEUROLOGIC SYMPTOMS: "Was there any loss of consciousness?" "Are there any other neurological symptoms?"      no 4. MENTAL STATUS: "Does the person know who they are, who you are, and where they are?"      No confusion 5. LOCATION: "What part of the head was hit?"      Back of head 7. SIZE: For cuts, bruises, or swelling, ask: "How large is it?" (e.g., inches or centimeters)      Slight cut on head, don't think it's that large, ambulance was not concerned that much about it, don't think still bleeding 8. PAIN: "Is there any pain?" If Yes, ask: "How bad is  it?"  (e.g., Scale 1-10; or mild, moderate, severe)     Getting worse, 5/10, constant but gets worse every now and then, feeling it all over inside 9. TETANUS: For any breaks in the skin, ask: "When was the last tetanus booster?"     2023 10. OTHER SYMPTOMS: "Do you have any other symptoms?" (e.g., neck pain, vomiting)       No vomiting, some neck pain Fallen before with severe head injury years ago and...forgot what I was gonna say More unsteady than usual but not that much more No vision  changes More tolerable with tylenol  No immediate neck pain Been to ED before, not gonna do it now, refuse to unless I'm dying Totally disgusted with the whole medical system in the [place]  your doctor's office won't see you  Protocols used: Head Injury-A-AH

## 2024-04-23 NOTE — Telephone Encounter (Signed)
 Spoke to patient and she stated that she got dizzy and fell. She still denied going to the ER, she stated EMS came to check her out, but she told them she did not want to go. She stated she is doing okay.

## 2024-04-24 ENCOUNTER — Encounter (HOSPITAL_COMMUNITY): Payer: Self-pay

## 2024-04-24 ENCOUNTER — Emergency Department (HOSPITAL_COMMUNITY)

## 2024-04-24 ENCOUNTER — Other Ambulatory Visit: Payer: Self-pay | Admitting: Licensed Clinical Social Worker

## 2024-04-24 ENCOUNTER — Emergency Department (HOSPITAL_COMMUNITY)
Admission: EM | Admit: 2024-04-24 | Discharge: 2024-04-24 | Disposition: A | Discharge status: Home / Self Care | Attending: Emergency Medicine | Admitting: Emergency Medicine

## 2024-04-24 ENCOUNTER — Other Ambulatory Visit: Payer: Self-pay

## 2024-04-24 DIAGNOSIS — Z043 Encounter for examination and observation following other accident: Secondary | ICD-10-CM | POA: Diagnosis not present

## 2024-04-24 DIAGNOSIS — J45909 Unspecified asthma, uncomplicated: Secondary | ICD-10-CM | POA: Insufficient documentation

## 2024-04-24 DIAGNOSIS — R200 Anesthesia of skin: Secondary | ICD-10-CM | POA: Diagnosis not present

## 2024-04-24 DIAGNOSIS — E039 Hypothyroidism, unspecified: Secondary | ICD-10-CM | POA: Insufficient documentation

## 2024-04-24 DIAGNOSIS — W01198A Fall on same level from slipping, tripping and stumbling with subsequent striking against other object, initial encounter: Secondary | ICD-10-CM | POA: Insufficient documentation

## 2024-04-24 DIAGNOSIS — E1122 Type 2 diabetes mellitus with diabetic chronic kidney disease: Secondary | ICD-10-CM | POA: Diagnosis not present

## 2024-04-24 DIAGNOSIS — F0280 Dementia in other diseases classified elsewhere without behavioral disturbance: Secondary | ICD-10-CM | POA: Diagnosis not present

## 2024-04-24 DIAGNOSIS — Z8542 Personal history of malignant neoplasm of other parts of uterus: Secondary | ICD-10-CM | POA: Insufficient documentation

## 2024-04-24 DIAGNOSIS — M1811 Unilateral primary osteoarthritis of first carpometacarpal joint, right hand: Secondary | ICD-10-CM | POA: Diagnosis not present

## 2024-04-24 DIAGNOSIS — R42 Dizziness and giddiness: Secondary | ICD-10-CM | POA: Insufficient documentation

## 2024-04-24 DIAGNOSIS — M85831 Other specified disorders of bone density and structure, right forearm: Secondary | ICD-10-CM | POA: Diagnosis not present

## 2024-04-24 DIAGNOSIS — Z8505 Personal history of malignant neoplasm of liver: Secondary | ICD-10-CM | POA: Diagnosis not present

## 2024-04-24 DIAGNOSIS — M8580 Other specified disorders of bone density and structure, unspecified site: Secondary | ICD-10-CM | POA: Diagnosis not present

## 2024-04-24 DIAGNOSIS — N189 Chronic kidney disease, unspecified: Secondary | ICD-10-CM | POA: Insufficient documentation

## 2024-04-24 DIAGNOSIS — I129 Hypertensive chronic kidney disease with stage 1 through stage 4 chronic kidney disease, or unspecified chronic kidney disease: Secondary | ICD-10-CM | POA: Insufficient documentation

## 2024-04-24 DIAGNOSIS — W19XXXA Unspecified fall, initial encounter: Secondary | ICD-10-CM

## 2024-04-24 DIAGNOSIS — R202 Paresthesia of skin: Secondary | ICD-10-CM | POA: Diagnosis not present

## 2024-04-24 DIAGNOSIS — M25521 Pain in right elbow: Secondary | ICD-10-CM | POA: Diagnosis not present

## 2024-04-24 DIAGNOSIS — M79631 Pain in right forearm: Secondary | ICD-10-CM | POA: Diagnosis not present

## 2024-04-24 DIAGNOSIS — I10 Essential (primary) hypertension: Secondary | ICD-10-CM | POA: Diagnosis not present

## 2024-04-24 LAB — COMPREHENSIVE METABOLIC PANEL WITH GFR
ALT: 12 U/L (ref 0–44)
AST: 20 U/L (ref 15–41)
Albumin: 3.8 g/dL (ref 3.5–5.0)
Alkaline Phosphatase: 54 U/L (ref 38–126)
Anion gap: 11 (ref 5–15)
BUN: 22 mg/dL (ref 8–23)
CO2: 25 mmol/L (ref 22–32)
Calcium: 9.2 mg/dL (ref 8.9–10.3)
Chloride: 106 mmol/L (ref 98–111)
Creatinine, Ser: 1.51 mg/dL — ABNORMAL HIGH (ref 0.44–1.00)
GFR, Estimated: 36 mL/min — ABNORMAL LOW (ref >60)
Glucose, Bld: 144 mg/dL — ABNORMAL HIGH (ref 70–99)
Potassium: 4.8 mmol/L (ref 3.5–5.1)
Sodium: 142 mmol/L (ref 135–145)
Total Bilirubin: 0.9 mg/dL (ref 0.0–1.2)
Total Protein: 7 g/dL (ref 6.5–8.1)

## 2024-04-24 LAB — CBC
HCT: 32.7 % — ABNORMAL LOW (ref 36.0–46.0)
Hemoglobin: 10.3 g/dL — ABNORMAL LOW (ref 12.0–15.0)
MCH: 29.1 pg (ref 26.0–34.0)
MCHC: 31.5 g/dL (ref 30.0–36.0)
MCV: 92.4 fL (ref 80.0–100.0)
Platelets: 248 10*3/uL (ref 150–400)
RBC: 3.54 MIL/uL — ABNORMAL LOW (ref 3.87–5.11)
RDW: 13.3 % (ref 11.5–15.5)
WBC: 4.9 10*3/uL (ref 4.0–10.5)
nRBC: 0 % (ref 0.0–0.2)

## 2024-04-24 LAB — CBG MONITORING, ED: Glucose-Capillary: 132 mg/dL — ABNORMAL HIGH (ref 70–99)

## 2024-04-24 MED ORDER — ACETAMINOPHEN 500 MG PO TABS
1000.0000 mg | ORAL_TABLET | Freq: Once | ORAL | Status: AC
  Administered 2024-04-24: 1000 mg via ORAL
  Filled 2024-04-24: qty 2

## 2024-04-24 NOTE — Telephone Encounter (Signed)
 No return communication from patient. Will await return call.

## 2024-04-24 NOTE — Patient Outreach (Signed)
 Complex Care Management   Visit Note  04/24/2024  Name:  [name] MRN: [id number] DOB: 1951/06/27  Situation: Referral received for Complex Care Management related to SDOH Barriers:  Financial Resource Strain Home repair I obtained verbal consent from Patient.  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 name of MD    Dementia (HCC)    Diabetes mellitus    type 2    Diverticulosis    Dyslipidemia    Dyspnea    with exertion    Dysrhythmia    "skips beats"   Endometrial cancer (HCC) 2021   Family history of adverse reaction to anesthesia    son stopped breathing during surgery , mother slow to wake up    Fibromyalgia 1980's   Generalized anxiety disorder    GERD (gastroesophageal reflux disease)    occ- will use Tums and Prolosec  prn    Heart murmur    Hyperlipemia    Hypertension    not on blood pressure meds due to allergies - last dose of meds- months ago    Hypothyroidism    Hypothyroidism (acquired)    Internal hemorrhoids    Irritable bowel syndrome 1980's   Liver cancer (HCC)    Major depressive disorder    Migraine headaches 1980's   On meds-well controlled   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   Morbid obesity (HCC)    Pernicious anemia    Pneumonia 2009   Several times over the past several years   PONV (postoperative nausea and vomiting)    was told by neurologist due to calcificaton in brain not to be put to sleep, N/V during Op and Recovery    Recurrent upper respiratory infection (URI)    Sleep apnea    no cpap   Subarachnoid hemorrhage (HCC) 2010   Vaginal polyp    benign  per pt    Assessment: Patient Reported Symptoms:  Cognitive Cognitive Status: Alert and oriented to person, place, and time, Normal speech and language skills Cognitive/Intellectual Conditions Management [RPT]: None reported or documented in medical history or problem list   Health Maintenance Behaviors: Annual physical exam  Neurological Neurological Review of Symptoms: Dizziness Neurological Conditions: Parkinson's disease Neurological Self-Management Outcome: 3 (uncertain)  HEENT HEENT Symptoms Reported: Not assessed      Cardiovascular Cardiovascular Symptoms Reported: Not assessed    Respiratory Respiratory Symptoms Reported: Not assesed    Endocrine Patient reports the following symptoms related to hypoglycemia or hyperglycemia : Not assessed    Gastrointestinal Gastrointestinal Symptoms Reported: Not assessed      Genitourinary Genitourinary Symptoms Reported: Not assessed    Integumentary Integumentary Symptoms Reported: Not assessed    Musculoskeletal Musculoskelatal Symptoms Reviewed: Difficulty walking, Unsteady gait Additional Musculoskeletal Details: [name] this past week and hit her head, EMS was called and she refused to go to the ED. Pt refused 911 contact today and stated she would go to ED if she felt she needed to. Pt plans on following up with her provider. CSW  denied any further interventions from this CSW. Musculoskeletal Conditions: Mobility limited, Unsteady gait Musculoskeletal Management Strategies: Activity Musculoskeletal Self-Management Outcome: 2 (bad) Falls in the past year?: Yes Number of falls in past year: 2 or more Was there an injury with Fall?: Yes Fall Risk Category Calculator: 3 Patient Fall Risk Level: High Fall Risk Patient at Risk for Falls Due to: Impaired balance/gait, Impaired mobility Fall risk Follow up: Falls prevention discussed, Education provided  Psychosocial Psychosocial Symptoms Reported: Not assessed     Quality of  Family Relationships: helpful, involved Do you feel physically threatened by others?: No      01/28/2024    2:17 PM  Depression screen PHQ 2/9  Decreased Interest 1  Down, Depressed, Hopeless 1  PHQ - 2 Score 2  Altered sleeping 0  Tired, decreased energy 1  Change in appetite 0  Feeling bad or failure about yourself  0  Trouble concentrating 0  Moving slowly or fidgety/restless 0  Suicidal thoughts 0  PHQ-9 Score 3    There were no vitals filed for this visit.  Medications Reviewed Today   Medications were not reviewed in this encounter     Recommendation:   Review materials on community resources. Contact provider with any new health needs. CSW will research available community resources and application process.  Follow Up Plan:   Telephone follow up appointment date/time:  04/29/2024  Hale Level, LCSW [place], [facility] Licensed Clinical Social Worker Care Coordinator [phone]

## 2024-04-24 NOTE — ED Provider Notes (Signed)
 [place] [facility] Provider Note  History  Chief Complaint:  Fall  The history is provided by the patient.  Fall   [name] is a 73 y.o. female with a history of CKD, dementia, diabetes who presents the emergency department for a fall.  She reports the fall happened on Monday.  She states that she fell backwards striking the posterior aspect of her head.  She also describes that she fell over on her right arm.  She states that she has baseline sensation changes all of her body.  She states she has no weakness or numbness.  She has no back pain.  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 name of MD    Dementia (HCC)    Diabetes mellitus    type 2    Diverticulosis    Dyslipidemia    Dyspnea    with exertion    Dysrhythmia    "skips beats"   Endometrial cancer (HCC) 2021   Family history of adverse reaction to anesthesia    son stopped breathing during surgery , mother slow to wake up    Fibromyalgia 1980's   Generalized anxiety disorder    GERD (gastroesophageal reflux disease)    occ- will use Tums and Prolosec  prn    Heart murmur    Hyperlipemia    Hypertension    not on blood pressure meds due to allergies - last dose of meds- months ago    Hypothyroidism    Hypothyroidism (acquired)    Internal hemorrhoids    Irritable bowel syndrome 1980's   Liver cancer (HCC)    Major depressive disorder    Migraine headaches 1980's   On meds-well controlled   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   Morbid obesity (HCC)    Pernicious anemia    Pneumonia 2009   Several times over the past several years   PONV (postoperative  nausea and vomiting)    was told by neurologist due to calcificaton in brain not to be put to sleep, N/V during Op and Recovery    Recurrent upper respiratory infection (URI)    Sleep apnea    no cpap   Subarachnoid hemorrhage (HCC) 2010   Vaginal polyp    benign per pt    Past Surgical History:  Procedure Laterality Date   BIOPSY  01/07/2024   Procedure: BIOPSY;  Surgeon: Sergio Dandy, MD;  Location: [facility];  Service: Gastroenterology;;   CATARACT EXTRACTION Right    CHOLECYSTECTOMY  1983   COLONOSCOPY  2008   DB   COLONOSCOPY WITH PROPOFOL  N/A 01/07/2024   Procedure: COLONOSCOPY WITH PROPOFOL ;  Surgeon: Sergio Dandy, MD;  Location: [facility];  Service: Gastroenterology;  Laterality: N/A;   DILATION AND CURETTAGE OF UTERUS  1972   ESOPHAGOGASTRODUODENOSCOPY (EGD) WITH PROPOFOL  N/A 01/07/2024   Procedure: ESOPHAGOGASTRODUODENOSCOPY (EGD) WITH PROPOFOL ;  Surgeon: Sergio Dandy, MD;  Location: [facility];  Service: Gastroenterology;  Laterality: N/A;   HYSTEROSCOPY WITH D & C  06/29/2011   Procedure: DILATATION AND CURETTAGE (D&C) /  HYSTEROSCOPY;  Surgeon: Granville Layer, MD;  Location: [facility];  Service: Gynecology;  Laterality: N/A;   ROBOTIC ASSISTED TOTAL HYSTERECTOMY WITH BILATERAL SALPINGO OOPHERECTOMY N/A 10/27/2020   Procedure: XI ROBOTIC ASSISTED TOTAL HYSTERECTOMY WITH BILATERAL SALPINGO OOPHORECTOMY;  Surgeon: Alphonso Aschoff, MD;  Location: [facility];  Service: Gynecology;  Laterality: N/A;   SENTINEL NODE BIOPSY N/A 10/27/2020   Procedure: SENTINEL NODE BIOPSY;  Surgeon: Alphonso Aschoff, MD;  Location: [facility];  Service: Gynecology;  Laterality: N/A;    Family History  Problem Relation Age of Onset   Anesthesia problems Mother        hard to wake post op    Cerebrovascular Disease Mother        Never had stroke, but had CEA.   Liver cancer Father        mets to liver    Bladder Cancer Father    Colon cancer Father        mets to colon    Schizophrenia  Daughter        Schizoaffective disorder   Anesthesia problems Son        stopped breathing post op    Tremor Son    Anesthesia problems Granddaughter        PONV   High blood pressure Other    Diabetes Other    Arthritis Other    Schizophrenia Child    Tremor Child    Dementia Neg Hx    Esophageal cancer Neg Hx    Rectal cancer Neg Hx    Stomach cancer Neg Hx    Colon polyps Neg Hx     Social History   Tobacco Use   Smoking status: Never   Smokeless tobacco: Never  Vaping Use   Vaping status: Never Used  Substance Use Topics   Alcohol use: Not Currently    Comment: quit 1978   Drug use: Never    Review of Systems  Review of Systems   Reviewed and documented in HPI if pertinent.   Physical Exam   ED Triage Vitals  Encounter Vitals Group     BP 04/24/24 1238 (!) 143/82     Systolic BP Percentile --      Diastolic BP Percentile --      Pulse Rate 04/24/24 1238 77     Resp 04/24/24 1238 16     Temp 04/24/24 1238 98.4 F (36.9 C)     Temp Source 04/24/24 1238 Oral     SpO2 04/24/24 1238 100 %     Weight 04/24/24 1235 182 lb (82.6 kg)     Height 04/24/24 1235 5\' 2"  (1.575 m)     Head Circumference --      Peak Flow --      Pain Score 04/24/24 1235 7     Pain Loc --      Pain Education --      Exclude from Growth Chart --      Physical Exam  Constitutional Nursing notes reviewed Vital signs reviewed  Head No obvious trauma No skull depressions or lacerations  ENT PERRL No conjunctival hemorrhage No periorbital ecchymoses, Racoon Eyes, or Battle Sign bilaterally Ears atraumatic No nasal septal deviation or hematoma Mouth and tongue atraumatic Trachea midline.  Neck No C spine stepoffs, deformities, or tenderness  Chest Clavicles atraumatic Clavicles stable to anterior compression without crepitus Chest wall with symmetric expansion Chest wall stable to anterior and lateral compression without crepitus  Respiratory Effort  normal CTAB  No respiratory distress  CV Normal rate DP and radial pulses 2+ and equal bilaterally  Abdomen Soft Non-tender Non-distended No peritonitis No abrasions/contusions  GU Atraumatic No gross blood  MSK Atraumatic No obvious deformity 5-5 strength in all 4 extremities 2+ radial and DP pulses Intact sensation all 4 extremities ROM appropriate Pelvis stable to lateral compression  Back T spine non-tender L spine non-tender No step offs or deformities   Skin Warm Dry  Neuro Awake and alert Moving all extremities GCS 15   Procedures   Procedures  ED Course - Medical Decision Making  Brief Overview [name] is a 73 y.o. female who presents as per above.  I have reviewed the nursing documentation for past medical history, family history, and social history and agree.  I have reviewed the patient's vital signs. There are no abnormalities.  Initial Differential Diagnoses: I am primarily concerned for intracranial bleed, intra-abdominal bleeding, rib fractures, pneumothorax, hemothorax, life-threatening extremity injury, fracture, dislocation, thoracic spine fracture, lumbar spine fracture, cervical spine fracture   Therapies: These medications and interventions were provided for the patient while in the ED.  Medications  acetaminophen  (TYLENOL ) tablet 1,000 mg (1,000 mg Oral Given 04/24/24 1631)    Testing Results: On my interpretation labs are significant for : Cr at baseline Hgb 10.3, at baseline  On my interpretation imaging is significant for: No acute traumatic injuries  EKG Interpretation Date/Time:    Ventricular Rate:    PR Interval:    QRS Duration:    QT Interval:    QTC Calculation:   R Axis:      Text Interpretation:     See the EMR for full details regarding lab and imaging results.   Medical Decision Making [name] is a 73 y.o. female with significant PMH as above who presented to the ED as a  non-leveled trauma secondary to fall.  ABCs intact. GCS 15.  Afebrile, hemodynamically stable. PE as below, notable for mild tenderness to posterior head, no C,T, L spine tenderness.   CT head and C-spine were performed which were negative for acute injuries.  X-rays were performed of the right upper extremity which revealed no acute fractures.  Patient states that she does have intermittent numbness and tingling all over her body.  She states this is baseline it has been going on for years.  She did discuss this with the triage nurse he reports that is not been tingling.  On exam patient has normal sensation and strength in his extremity.  She has no focal neurological deficit on exam.  Patient has no abdominal tenderness therefore do not feel abdominal imaging is necessary.  Do not feel that further spine imaging is indicated other than C-spine.  Given her presentation and negative workup I do feel the patient is reasonable for outpatient management.  Patient states that she is struggled with dizziness for several years.  She states that it has been getting better.  She states that she did have an episode of dizziness which prompted her fall.  She describes this dizziness as lightheadedness.  It seems postural in nature.  Her ECG is normal.  Her blood glucose is normal.  This happened on Monday and has not recurred.  I do feel that no further imaging or testing is indicated.  Follow-up with PCP.  Amount and/or Complexity of Data Reviewed Labs: ordered. Radiology: ordered.  Risk OTC drugs.     ### All radiography studies, electrocardiograms, and laboratory data were personally  reviewed by me and incorporated into my medical decision making. Impression   1. Fall, initial encounter      Note: Chief Executive Officer was used in Surveyor, minerals of this note.     Arminda Landmark, MD 04/24/24 1634    Lind Repine, MD 04/24/24 [phone]

## 2024-04-24 NOTE — ED Triage Notes (Addendum)
 Pt states she got dizzy and fell 3-4 days ago. She hit her head on stove and shattered glass and is continuing to hurt. Pt states that since her fall her right arm has been numb and tingling.

## 2024-04-24 NOTE — Discharge Instructions (Addendum)
 [name]:  Thank you for allowing us  to take care of you today.  We hope you begin feeling better soon. You were seen today for fall.  Your imaging is negative here for acute injuries.  Please follow-up with your primary care doctor.  To-Do:  Please follow-up with your primary doctor within the next 2-3 days. It is important that you review any labs or imaging results (if any) that you had today with them. Your preliminary imaging results (if any) are attached. Please return to the Emergency Department or call 911 if you experience chest pain, shortness of breath, severe pain, severe fever, altered mental status, or have any reason to think that you need emergency medical care.  Thank you again.  Hope you feel better soon.  Arminda Landmark, MD Department of Emergency Medicine

## 2024-04-24 NOTE — Patient Instructions (Signed)
 Visit Information  Thank you for taking time to visit with me today. Please don't hesitate to contact me if I can be of assistance to you before our next scheduled appointment.  Our next appointment is by telephone on 04/29/2024. Please call the care guide team at [phone] if you need to cancel or reschedule your appointment.   Following is a copy of your care plan:   Goals Addressed             This Visit's Progress    VBCI Social Work Care Plan       Problems:   Corporate treasurer  and Transportation  CSW Clinical Goal(s):   Over the next 6 weeks the Patient will explore community resource options for unmet needs related to [employer] , Transportation, and housing Psychologist, forensic.  Interventions:  Social Determinants of Health in Patient with COPD: SDOH assessments completed: Corporate treasurer , Transportation, and housing repair Evaluation of current treatment plan related to unmet needs Will assess for aging gracefully and home rehabilitation project.  Patient Goals/Self-Care Activities:  Continue taking your medication as prescribed.   Review Agricultural engineer on community resources..  Plan:   Telephone follow up appointment with care management team member scheduled for:  04/29/2024        Please call the Suicide and Crisis Lifeline: 988 if you are experiencing a Mental Health or Behavioral Health Crisis or need someone to talk to.  Patient verbalizes understanding of instructions and care plan provided today and agrees to view in MyChart. Active MyChart status and patient understanding of how to access instructions and care plan via MyChart confirmed with patient.     Hale Level, LCSW [place]/[facility], [facility] Licensed Clinical Social Worker Care Coordinator [phone]

## 2024-04-28 ENCOUNTER — Ambulatory Visit: Payer: 59 | Admitting: Gastroenterology

## 2024-04-28 ENCOUNTER — Telehealth: Payer: Self-pay | Admitting: Gastroenterology

## 2024-04-28 NOTE — Telephone Encounter (Signed)
 Spoke with the pt and made her aware that Dr Allean Aran next appt is 7/29.  She agrees to the appt and has been set up at 230 pm.  She will call back to see if we have had any cancellations in the meantime.

## 2024-04-28 NOTE — Telephone Encounter (Signed)
 Patient called and stated that she has a family emergency and will not be able to make her appointment scheduled for today. Patient stated that she would like to reschedule with Dr. Nandigam only. I advised patient that Dr. Leonia Raman is booked up through the month of July. Patient is requesting to speak with Dr. Leonia Raman. Please advise.

## 2024-04-29 ENCOUNTER — Other Ambulatory Visit: Payer: Self-pay | Admitting: Licensed Clinical Social Worker

## 2024-04-29 NOTE — Patient Outreach (Signed)
 Complex Care Management   Visit Note  04/29/2024  Name:  [name] MRN: [id number] DOB: 1951-08-21  Situation: Referral received for Complex Care Management related to SDOH Barriers:  Financial Resource Strain I obtained verbal consent from Patient.  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 name of MD    Dementia (HCC)    Diabetes mellitus    type 2    Diverticulosis    Dyslipidemia    Dyspnea    with exertion    Dysrhythmia    "skips beats"   Endometrial cancer (HCC) 2021   Family history of adverse reaction to anesthesia    son stopped breathing during surgery , mother slow to wake up    Fibromyalgia 1980's   Generalized anxiety disorder    GERD (gastroesophageal reflux disease)    occ- will use Tums and Prolosec  prn    Heart murmur    Hyperlipemia    Hypertension    not on blood pressure meds due to allergies - last dose of meds- months ago    Hypothyroidism    Hypothyroidism (acquired)    Internal hemorrhoids    Irritable bowel syndrome 1980's   Liver cancer (HCC)    Major depressive disorder    Migraine headaches 1980's   On meds-well controlled   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   Morbid obesity (HCC)    Pernicious anemia    Pneumonia 2009   Several times over the past several years   PONV (postoperative nausea and vomiting)    was told by neurologist due to calcificaton in brain not to be put to sleep, N/V during Op and Recovery    Recurrent upper respiratory infection (URI)    Sleep apnea    no cpap   Subarachnoid hemorrhage (HCC) 2010   Vaginal polyp    benign per pt     Assessment: Patient Reported Symptoms:  Cognitive Cognitive Status: Alert and oriented to person, place, and time, Normal speech and language skills Cognitive/Intellectual Conditions Management [RPT]: None reported or documented in medical history or problem list   Health Maintenance Behaviors: Annual physical exam  Neurological Neurological Review of Symptoms: Dizziness Neurological Conditions: Parkinson's disease  HEENT HEENT Symptoms Reported: Not assessed      Cardiovascular Cardiovascular Symptoms Reported: Not assessed    Respiratory Respiratory Symptoms Reported: Not assesed    Endocrine Patient reports the following symptoms related to hypoglycemia or hyperglycemia : Not assessed    Gastrointestinal Gastrointestinal Symptoms Reported: Not assessed      Genitourinary Genitourinary Symptoms Reported: Not assessed    Integumentary Integumentary Symptoms Reported: Not assessed    Musculoskeletal Musculoskelatal Symptoms Reviewed: Difficulty walking, Unsteady gait Musculoskeletal Conditions: Mobility limited Musculoskeletal Management Strategies: Activity Musculoskeletal Self-Management Outcome: 3 (uncertain) Falls in the past year?: Yes Number of falls in past year: 2 or more Was there an injury with Fall?: Yes Fall Risk Category Calculator: 3 Patient Fall Risk Level: High Fall Risk Patient at Risk for Falls Due to: Impaired mobility,  Impaired balance/gait Fall risk Follow up: Education provided, Falls prevention discussed  Psychosocial Psychosocial Symptoms Reported: Not assessed            01/28/2024    2:17 PM  Depression screen PHQ 2/9  Decreased Interest 1  Down, Depressed, Hopeless 1  PHQ - 2 Score 2  Altered sleeping 0  Tired, decreased energy 1  Change in appetite 0  Feeling bad or failure about yourself  0  Trouble concentrating 0  Moving slowly or fidgety/restless 0  Suicidal thoughts 0  PHQ-9 Score 3    There were no vitals filed for this  visit.  Medications Reviewed Today     Reviewed by Jens Molder, LCSW (Social Worker) on 04/29/24 at 1124  Med List Status: <None>   Medication Order Taking? Sig Documenting Provider Last Dose Status Informant  aspirin  EC 81 MG tablet [id number] Yes Take 81 mg by mouth daily. Swallow whole. [provider] Taking Active   Cholecalciferol  (VITAMIN D3) 25 MCG (1000 UT) capsule [id number] Yes Take 1,000 Units by mouth daily. [provider] Taking Active Self  cyanocobalamin  (VITAMIN B12) injection 1,000 mcg [id number]   Christel Cousins, MD  Active   cyanocobalamin  (VITAMIN B12) injection 1,000 mcg [id number]   Christel Cousins, MD  Active   estradiol  (ESTRACE ) 0.1 MG/GM vaginal cream [id number]  Place 0.5 g vaginally 2 (two) times a week. Place 1g twice a week Zuleta, [name] G, NP  Active   Evolocumab  (REPATHA  SURECLICK) 140 MG/ML SOAJ [id number]  INJECT 1 PEN SUBCUTANEOUSLY EVERY 14 DAYS Christel Cousins, MD  Active   glucose blood (ACCU-CHEK GUIDE) test strip [id number]  Use to test blood sugar 3 times daily  Patient taking differently: 1 each as needed. Use to test blood sugar 3 times daily   Christel Cousins, MD  Active Self  levothyroxine  (SYNTHROID ) 50 MCG tablet [id number] Yes TAKE 1 TABLET BY MOUTH EVERY DAY BEFORE BREAKFAST Christel Cousins, MD Taking Active   MAGNESIUM  CITRATE PO [id number] No Take 1 tablet by mouth 2 (two) times daily.  Patient not taking: Reported on 04/29/2024   [provider] Not Taking Active Self           Med Note (RATLIFF, THURSHELL   Fri Nov 01, 2023  1:24 PM) As needed  Multiple Vitamin (MULTIVITAMIN WITH MINERALS) TABS tablet [id number] Yes Take 1 tablet by mouth daily. Liam Redhead, MD Taking Active Self  potassium chloride  SA (KLOR-CON  M) 20 MEQ tablet [id number] Yes TAKE 1 TABLET BY MOUTH 2 TIMES DAILY Christel Cousins, MD Taking Active   torsemide  (DEMADEX ) 20 MG tablet [id number] Yes TAKE 1 TABLET BY MOUTH ONCE DAILY  Patient  taking differently: Take 20 mg by mouth daily.   Rondall Codding, FNP Taking Active   Med List Note Laurann Pollock, [place] 10/28/20 1427):  Recommendation:   CSW will assist pt with applying to aging gracefully. CSW sent referral to home rehabilitation through [facility].  Follow Up Plan:   Telephone follow up appointment date/time:  05/01/2024  Hale Level, LCSW [place]/[facility], [facility] Licensed Clinical Social Worker Care Coordinator [phone]

## 2024-05-01 ENCOUNTER — Telehealth: Payer: Self-pay | Admitting: Licensed Clinical Social Worker

## 2024-05-05 ENCOUNTER — Ambulatory Visit (INDEPENDENT_AMBULATORY_CARE_PROVIDER_SITE_OTHER): Payer: 59 | Admitting: Podiatry

## 2024-05-05 DIAGNOSIS — Z91198 Patient's noncompliance with other medical treatment and regimen for other reason: Secondary | ICD-10-CM

## 2024-05-05 NOTE — Progress Notes (Signed)
 1. Failure to attend appointment with reason given    Patient canceled and rescheduled appointment.

## 2024-05-06 ENCOUNTER — Ambulatory Visit (INDEPENDENT_AMBULATORY_CARE_PROVIDER_SITE_OTHER): Admitting: Podiatry

## 2024-05-06 ENCOUNTER — Other Ambulatory Visit: Payer: Self-pay | Admitting: Licensed Clinical Social Worker

## 2024-05-06 ENCOUNTER — Encounter: Payer: Self-pay | Admitting: Podiatry

## 2024-05-06 DIAGNOSIS — B351 Tinea unguium: Secondary | ICD-10-CM | POA: Diagnosis not present

## 2024-05-06 DIAGNOSIS — M79674 Pain in right toe(s): Secondary | ICD-10-CM | POA: Diagnosis not present

## 2024-05-06 DIAGNOSIS — M79675 Pain in left toe(s): Secondary | ICD-10-CM

## 2024-05-06 DIAGNOSIS — N1831 Chronic kidney disease, stage 3a: Secondary | ICD-10-CM

## 2024-05-06 DIAGNOSIS — E1122 Type 2 diabetes mellitus with diabetic chronic kidney disease: Secondary | ICD-10-CM | POA: Diagnosis not present

## 2024-05-06 NOTE — Patient Outreach (Signed)
 Complex Care Management   Visit Note  05/06/2024  Name:  [name] MRN: [id number] DOB: February 03, 1951  Situation: Referral received for Complex Care Management related to SDOH Barriers:  Financial Resource Strain I obtained verbal consent from Patient.  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 name of MD    Dementia (HCC)    Diabetes mellitus    type 2    Diverticulosis    Dyslipidemia    Dyspnea    with exertion    Dysrhythmia    "skips beats"   Endometrial cancer (HCC) 2021   Family history of adverse reaction to anesthesia    son stopped breathing during surgery , mother slow to wake up    Fibromyalgia 1980's   Generalized anxiety disorder    GERD (gastroesophageal reflux disease)    occ- will use Tums and Prolosec  prn    Heart murmur    Hyperlipemia    Hypertension    not on blood pressure meds due to allergies - last dose of meds- months ago    Hypothyroidism    Hypothyroidism (acquired)    Internal hemorrhoids    Irritable bowel syndrome 1980's   Liver cancer (HCC)    Major depressive disorder    Migraine headaches 1980's   On meds-well controlled   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   Morbid obesity (HCC)    Pernicious anemia    Pneumonia 2009   Several times over the past several years   PONV (postoperative nausea and vomiting)    was told by neurologist due to calcificaton in brain not to be put to sleep, N/V during Op and Recovery    Recurrent upper respiratory infection (URI)    Sleep apnea    no cpap   Subarachnoid hemorrhage (HCC) 2010   Vaginal polyp    benign per pt     Assessment: Patient Reported Symptoms:  Cognitive Cognitive Status: Alert and oriented to person, place, and time, Insightful and able to interpret abstract concepts, Normal speech and language skills Cognitive/Intellectual Conditions Management [RPT]: None reported or documented in medical history or problem list   Health Maintenance Behaviors: Annual physical exam  Neurological Neurological Review of Symptoms: Dizziness Neurological Conditions: Parkinson's disease  HEENT HEENT Symptoms Reported: Not assessed      Cardiovascular Cardiovascular Symptoms Reported: Not assessed    Respiratory Respiratory Symptoms Reported: Not assesed    Endocrine Patient reports the following symptoms related to hypoglycemia or hyperglycemia : Not assessed    Gastrointestinal Gastrointestinal Symptoms Reported: Not assessed      Genitourinary Genitourinary Symptoms Reported: Not assessed    Integumentary Integumentary Symptoms Reported: Not assessed    Musculoskeletal Musculoskelatal Symptoms Reviewed: Difficulty walking, Unsteady gait Musculoskeletal Conditions: Mobility limited Musculoskeletal Management Strategies: Activity Musculoskeletal Self-Management Outcome: 3 (uncertain) Falls in the past year?: Yes Number of falls in past year: 2 or more Was there an injury with Fall?: Yes Fall Risk Category Calculator: 3 Patient Fall Risk Level: High Fall Risk  Psychosocial Psychosocial Symptoms Reported: Anxiety - if selected complete GAD Behavioral Health Conditions: Anxiety          01/28/2024    2:17 PM  Depression screen PHQ 2/9  Decreased Interest 1  Down, Depressed, Hopeless 1  PHQ - 2 Score 2  Altered sleeping 0  Tired, decreased energy 1  Change in appetite 0  Feeling bad or failure about yourself  0  Trouble concentrating 0  Moving slowly or fidgety/restless 0  Suicidal thoughts 0  PHQ-9 Score 3    There were no vitals filed for this visit.  Medications Reviewed  Today   Medications were not reviewed in this encounter     Recommendation:   Completed referral to aging gracefully (05/06/2024) Consider PCS services options Contact health insurance shoppe for insurance options  Follow Up Plan:   Telephone follow up appointment date/time:  06/03/2024  Hale Level, LCSW /[facility], [facility] Licensed Clinical Social Worker Care Coordinator [phone]

## 2024-05-06 NOTE — Patient Instructions (Signed)
 Visit Information  Thank you for taking time to visit with me today. Please don't hesitate to contact me if I can be of assistance to you before our next scheduled appointment.  Your next care management appointment is by telephone on 06/03/2024.  Please call the care guide team at [phone] if you need to cancel, schedule, or reschedule an appointment.   Please call the Suicide and Crisis Lifeline: 988 if you are experiencing a Mental Health or Behavioral Health Crisis or need someone to talk to.  Hale Level, LCSW [facility], [facility] Licensed Clinical Social Worker Care Coordinator [phone]

## 2024-05-06 NOTE — Progress Notes (Unsigned)
 Subjective:  Patient ID: [name], female    DOB: 01-11-1951,  MRN: [id number]  73 y.o. female presents to clinic with  {jgcomplaint:23593}  Chief Complaint  Patient presents with   Plantar Fasciitis    RFC   New problem(s): None {jgcomplaint:23593}  PCP is Christel Cousins, MD.  Allergies  Allergen Reactions   Atenolol Other (See Comments)    Stomach problems, chills   Atorvastatin Other (See Comments)    Retained fluid   Canagliflozin Other (See Comments)    ALL DIABETIC MEDS PER PT   abdominal pain, headache   Losartan Potassium Other (See Comments)    chills, HA, stomach pain   Acarbose Nausea Only and Other (See Comments)     headache,nausea   Cinnamon Other (See Comments)    Stomach pain   Empagliflozin Other (See Comments)     headaches   Glimepiride Other (See Comments)    ALL DIABETIC MEDS PER PT  abdominal pain, headache   Metformin And Related Nausea And Vomiting and Other (See Comments)    Patient states that she has severe chills, headache and cramping additionally. Says blood sugar is uncontrolled because of this   Other Other (See Comments)    ALL DIABETIC MEDS PER PT    Pioglitazone Other (See Comments)    ALL DIABETIC MEDS PER PT = peripheral edema   Pravastatin Other (See Comments)    Retained fluid   Sitagliptin Other (See Comments)    ALL DIABETIC MEDS PER PT  Other reaction(s): headache   Spironolactone-Hctz Other (See Comments)   Statins Other (See Comments)    Myalgia   Sulfamethoxazole Other (See Comments)    ALL DIABETIC MEDS PER PT    Tramadol  Hcl Itching   Atorvastatin Calcium  Other (See Comments)     retain fld   Augmentin  [Amoxicillin -Pot Clavulanate] Other (See Comments)    Caused jaundice   Crestor [Rosuvastatin] Other (See Comments)   Rosuvastatin Calcium     Torsemide  Nausea Only    Gi upset, chills, abd pain,bloating   Bumex  [Bumetanide ] Rash and Other (See Comments)    Chills, rash, headaches, stomach issues also    Lasix  [Furosemide ] Rash and Other (See Comments)    Sugars increased   Sulfa Antibiotics Rash and Other (See Comments)    Mother has told patient in the past not to take, she cannot recall there reaction    Review of Systems: Negative except as noted in the HPI.   Objective:  GLORISTINE TURRUBIATES is a pleasant 73 y.o. female {jgbodyhabitus:24098} AAO x 3.  Vascular Examination: Vascular status intact b/l with palpable pedal pulses. CFT immediate b/l. No edema. No pain with calf compression b/l. Skin temperature gradient WNL b/l. {jgvascular:23595}  Neurological Examination: Sensation grossly intact b/l with 10 gram monofilament. Vibratory sensation intact b/l. {jgneuro:23601::"Protective sensation intact 5/5 intact bilaterally with 10g monofilament b/l.","Vibratory sensation intact b/l.","Proprioception intact bilaterally."}  Dermatological Examination: Pedal skin with normal turgor, texture and tone b/l. Toenails 1-5 b/l thick, discolored, elongated with subungual debris and pain on dorsal palpation. No hyperkeratotic lesions noted b/l. Hyperkeratotic lesion(s) {jgPodToeLocator:23637}.  No erythema, no edema, no drainage, no fluctuance.  Musculoskeletal Examination: Muscle strength 5/5 to b/l LE. HAV with bunion deformity noted b/l LE. Utilizes cane for ambulation assistance.  Radiographs: None  Last A1c:      Latest Ref Rng & Units 01/20/2024    3:16 PM 10/07/2023    2:45 PM 07/02/2023    2:47 PM  Hemoglobin A1C  Hemoglobin-A1c 4.6 - 6.5 % 6.0  6.8  7.7 Repeated and verified X2.      Assessment:  No diagnosis found.  Plan:  Patient was evaluated and treated. All patient's and/or POA's questions/concerns addressed on today's visit. 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  Continue daily foot inspections and monitor blood glucose per PCP/Endocrinologist's recommendations.Continue soft, supportive shoe gear daily. Report any pedal injuries to medical  professional. Call office if there are any quesitons/concerns. -Patient/POA to call should there be question/concern in the interim.  Return in about 3 months (around 08/06/2024).  Luella Sager, DPM      [place] LOCATION: [address] [place] [address]                   Office [phone]   [facility] LOCATION: [address] [place], [place] [address] Office [phone]

## 2024-05-15 ENCOUNTER — Encounter: Payer: Self-pay | Admitting: Family Medicine

## 2024-05-15 ENCOUNTER — Ambulatory Visit: Admitting: Family Medicine

## 2024-05-15 VITALS — BP 164/80 | HR 86 | Temp 97.8°F | Resp 18 | Ht 62.0 in | Wt 194.1 lb

## 2024-05-15 DIAGNOSIS — E538 Deficiency of other specified B group vitamins: Secondary | ICD-10-CM | POA: Diagnosis not present

## 2024-05-15 DIAGNOSIS — R600 Localized edema: Secondary | ICD-10-CM | POA: Diagnosis not present

## 2024-05-15 DIAGNOSIS — Z789 Other specified health status: Secondary | ICD-10-CM

## 2024-05-15 DIAGNOSIS — G43009 Migraine without aura, not intractable, without status migrainosus: Secondary | ICD-10-CM | POA: Diagnosis not present

## 2024-05-15 DIAGNOSIS — D638 Anemia in other chronic diseases classified elsewhere: Secondary | ICD-10-CM

## 2024-05-15 DIAGNOSIS — F439 Reaction to severe stress, unspecified: Secondary | ICD-10-CM

## 2024-05-15 NOTE — Patient Instructions (Addendum)
 Wear compression stockings  Call nephrologist about protein in urine

## 2024-05-15 NOTE — Progress Notes (Signed)
 Subjective:     Patient ID: [name], female    DOB: 1951/07/22, 73 y.o.   MRN: [id number]  Chief Complaint  Patient presents with   Medical Management of Chronic Issues    4 week follow-up Fasting     HPI Discussed the use of AI scribe software for clinical note transcription with the patient, who gave verbal consent to proceed.  History of Present Illness [name] is a 73 year old female who presents with persistent headaches following a fall. She is accompanied by her daughter.  On May 9th, she experienced dizziness and fell backwards in her kitchen, hitting her head on a glass stove panel. This resulted in a minor head cut and shattered glass, but no significant lump on her head. An ambulance was called, and she was assessed on-site but chose not to go to the hospital at that time.  A couple of days post-fall, she developed severe headaches. A brain scan was performed, which showed no brain bleed. The headaches have persisted but are less severe now. She notes that her vision has worsened since the fall, though she is uncertain if it is related. Has mac degen  She has a history of anemia, with hemoglobin levels typically between ten and eleven. She reports increased abdominal pain and nausea, which she attributes to her ulcers. Additionally, she is experiencing fluid retention, frothy urine, and chills, which she associates with past high uric acid levels and proteinuria.  She is currently taking torsemide , which she states causes severe chills. She expresses difficulty in managing her symptoms due to her inability to take certain medications.  She is responsible for taking care of her daughter, Eliot Guernsey, and mentions stress related to family dynamics and household responsibilities.  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 name of MD    Dementia (HCC)    Diabetes mellitus    type 2    Diverticulosis    Dyslipidemia    Dyspnea    with exertion    Dysrhythmia    "skips beats"   Endometrial cancer (HCC) 2021   Family history of adverse reaction to anesthesia    son stopped breathing during surgery , mother slow to wake up    Fibromyalgia 1980's   Generalized anxiety disorder    GERD (gastroesophageal reflux disease)    occ- will use Tums and Prolosec  prn    Heart murmur    Hyperlipemia    Hypertension    not on blood pressure meds due to allergies - last dose of meds- months ago    Hypothyroidism    Hypothyroidism (acquired)    Internal hemorrhoids    Irritable bowel syndrome 1980's   Liver cancer (HCC)    Major depressive disorder    Migraine headaches 1980's   On meds-well controlled   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  Morbid obesity (HCC)    Pernicious anemia    Pneumonia 2009   Several times over the past several years   PONV (postoperative nausea and vomiting)    was told by neurologist due to calcificaton in brain not to be put to sleep, N/V during Op and Recovery    Recurrent upper respiratory infection (URI)    Sleep apnea    no cpap   Subarachnoid hemorrhage (HCC) 2010   Vaginal polyp    benign per pt    Past Surgical History:  Procedure Laterality Date   BIOPSY  01/07/2024   Procedure: BIOPSY;  Surgeon: Sergio Dandy, MD;  Location: [facility];  Service: Gastroenterology;;   CATARACT EXTRACTION Right    CHOLECYSTECTOMY  1983   COLONOSCOPY  2008   DB   COLONOSCOPY WITH PROPOFOL  N/A 01/07/2024   Procedure:  COLONOSCOPY WITH PROPOFOL ;  Surgeon: Sergio Dandy, MD;  Location: [facility];  Service: Gastroenterology;  Laterality: N/A;   DILATION AND CURETTAGE OF UTERUS  1972   ESOPHAGOGASTRODUODENOSCOPY (EGD) WITH PROPOFOL  N/A 01/07/2024   Procedure: ESOPHAGOGASTRODUODENOSCOPY (EGD) WITH PROPOFOL ;  Surgeon: Sergio Dandy, MD;  Location: [facility];  Service: Gastroenterology;  Laterality: N/A;   HYSTEROSCOPY WITH D & C  06/29/2011   Procedure: DILATATION AND CURETTAGE (D&C) /HYSTEROSCOPY;  Surgeon: Granville Layer, MD;  Location: [facility];  Service: Gynecology;  Laterality: N/A;   ROBOTIC ASSISTED TOTAL HYSTERECTOMY WITH BILATERAL SALPINGO OOPHERECTOMY N/A 10/27/2020   Procedure: XI ROBOTIC ASSISTED TOTAL HYSTERECTOMY WITH BILATERAL SALPINGO OOPHORECTOMY;  Surgeon: Alphonso Aschoff, MD;  Location: [facility];  Service: Gynecology;  Laterality: N/A;   SENTINEL NODE BIOPSY N/A 10/27/2020   Procedure: SENTINEL NODE BIOPSY;  Surgeon: Alphonso Aschoff, MD;  Location: [facility];  Service: Gynecology;  Laterality: N/A;     Current Outpatient Medications:    aspirin  EC 81 MG tablet, Take 81 mg by mouth daily. Swallow whole., Disp: , Rfl:    Cholecalciferol  (VITAMIN D3) 25 MCG (1000 UT) capsule, Take 1,000 Units by mouth daily., Disp: , Rfl:    estradiol  (ESTRACE ) 0.1 MG/GM vaginal cream, Place 0.5 g vaginally 2 (two) times a week. Place 1g twice a week, Disp: 42.5 g, Rfl: 11   Evolocumab  (REPATHA  SURECLICK) 140 MG/ML SOAJ, INJECT 1 PEN SUBCUTANEOUSLY EVERY 14 DAYS, Disp: 6 mL, Rfl: 3   glucose blood (ACCU-CHEK GUIDE) test strip, Use to test blood sugar 3 times daily (Patient taking differently: 1 each as needed. Use to test blood sugar 3 times daily), Disp: 100 each, Rfl: 2   levothyroxine  (SYNTHROID ) 50 MCG tablet, TAKE 1 TABLET BY MOUTH EVERY DAY BEFORE BREAKFAST, Disp: 90 tablet, Rfl: 3   MAGNESIUM  CITRATE PO, Take 1 tablet by mouth 2 (two) times daily., Disp: , Rfl:    Multiple Vitamin (MULTIVITAMIN WITH  MINERALS) TABS tablet, Take 1 tablet by mouth daily., Disp: 30 tablet, Rfl: 0   potassium chloride  SA (KLOR-CON  M) 20 MEQ tablet, TAKE 1 TABLET BY MOUTH 2 TIMES DAILY, Disp: 180 tablet, Rfl: 1   torsemide  (DEMADEX ) 20 MG tablet, TAKE 1 TABLET BY MOUTH ONCE DAILY (Patient taking differently: Take 20 mg by mouth daily.), Disp: 90 tablet, Rfl: 1  Current Facility-Administered Medications:    cyanocobalamin  (VITAMIN B12) injection 1,000 mcg, 1,000 mcg, Intramuscular, Q30 days, Christel Cousins, MD, 1,000 mcg at 05/15/24 1109   cyanocobalamin  (VITAMIN B12) injection 1,000 mcg, 1,000 mcg, Intramuscular, Once, Christel Cousins, MD  Allergies  Allergen Reactions   Atenolol Other (  See Comments)    Stomach problems, chills   Atorvastatin Other (See Comments)    Retained fluid   Canagliflozin Other (See Comments)    ALL DIABETIC MEDS PER PT   abdominal pain, headache   Losartan Potassium Other (See Comments)    chills, HA, stomach pain   Acarbose Nausea Only and Other (See Comments)     headache,nausea   Cinnamon Other (See Comments)    Stomach pain   Empagliflozin Other (See Comments)     headaches   Glimepiride Other (See Comments)    ALL DIABETIC MEDS PER PT  abdominal pain, headache   Metformin And Related Nausea And Vomiting and Other (See Comments)    Patient states that she has severe chills, headache and cramping additionally. Says blood sugar is uncontrolled because of this   Other Other (See Comments)    ALL DIABETIC MEDS PER PT    Pioglitazone Other (See Comments)    ALL DIABETIC MEDS PER PT = peripheral edema   Pravastatin Other (See Comments)    Retained fluid   Sitagliptin Other (See Comments)    ALL DIABETIC MEDS PER PT  Other reaction(s): headache   Spironolactone-Hctz Other (See Comments)   Statins Other (See Comments)    Myalgia   Sulfamethoxazole Other (See Comments)    ALL DIABETIC MEDS PER PT    Tramadol  Hcl Itching   Atorvastatin Calcium  Other (See Comments)      retain fld   Augmentin  [Amoxicillin -Pot Clavulanate] Other (See Comments)    Caused jaundice   Crestor [Rosuvastatin] Other (See Comments)   Rosuvastatin Calcium     Torsemide  Nausea Only    Gi upset, chills, abd pain,bloating   Bumex  [Bumetanide ] Rash and Other (See Comments)    Chills, rash, headaches, stomach issues also   Lasix  [Furosemide ] Rash and Other (See Comments)    Sugars increased   Sulfa Antibiotics Rash and Other (See Comments)    Mother has told patient in the past not to take, she cannot recall there reaction   ROS neg/noncontributory except as noted HPI/below      Objective:      BP (!) 164/80   Pulse 86   Temp 97.8 F (36.6 C) (Temporal)   Resp 18   Ht 5\' 2"  (1.575 m)   Wt 194 lb 2 oz (88.1 kg)   SpO2 99%   BMI 35.51 kg/m  Wt Readings from Last 3 Encounters:  05/15/24 194 lb 2 oz (88.1 kg)  04/24/24 182 lb (82.6 kg)  04/14/24 191 lb 8 oz (86.9 kg)    Physical Exam   Gen: WDWN NAD HEENT: NCAT, conjunctiva not injected, sclera nonicteric NECK:  supple, no thyromegaly, no nodes, no carotid bruits CARDIAC: RRR, S1S2+, no murmur.  LUNGS: CTAB. No wheezes EXT:  1+ edema MSK: no gross abnormalities.  cane NEURO: A&O x3.  CN II-XII intact.  PSYCH: normal mood. Good eye contact     Assessment & Plan:  Local edema  B12 deficiency  Anemia, chronic disease  Medication intolerance  Stress at home  Migraine without aura and without status migrainosus, not intractable  Assessment and Plan Assessment & Plan Headache   Persistent headaches have continued since a fall on May 9th, with imaging showing no intracranial hemorrhage. Although headaches have improved, she persists, likely due to stress and inability to take medication. Advise using non-pharmacological methods such as heat and ice for relief.  Dizziness   Dizziness has recurred following the fall, despite previous improvement  over the past year.  Vision problems   Vision is  worsening, though it is unclear if this is related to the fall. Has mac degen  Nausea   Nausea has increased, possibly linked to headaches or gastrointestinal issues.  Edema   Fluid retention with weight gain is present. She experiences difficulty with torsemide  due to side effects. Recommend wearing compression stockings daily to manage fluid retention and potentially reduce the need for torsemide . Consider reducing torsemide  frequency if compression stockings are effective.  Proteinuria   Proteinuria with frothy urine is noted. Her last nephrology visit was approximately four months ago. Advise contacting the nephrologist for an earlier follow-up due to increasing proteinuria and fluid retention. Consider quantification of proteinuria. Intol mult meds so not a lot to offer  Anemia   Chronic anemia is present with hemoglobin levels typically between 10 and 11. Stable  B12 deficiency-received B12 injection    Return in about 4 weeks (around 06/12/2024) for chronic follow-up.  Ellsworth Haas, MD

## 2024-05-19 ENCOUNTER — Telehealth: Payer: Self-pay | Admitting: *Deleted

## 2024-05-19 NOTE — Telephone Encounter (Signed)
 Copied from CRM [id number]. Topic: Clinical - Medication Question >> May 19, 2024 11:02 AM Baldo Levan wrote: Reason for CRM: Christiane Cowing From [facility] called in asking if the patient is on Sertaline 25 MG and Lexapro  5MG  at the same time or if one was replacing the other. Please call [phone] to advise

## 2024-05-19 NOTE — Telephone Encounter (Signed)
 Confirmed with pharmacy that patient no longer take either medication.

## 2024-05-20 DIAGNOSIS — R609 Edema, unspecified: Secondary | ICD-10-CM | POA: Diagnosis not present

## 2024-05-20 DIAGNOSIS — N2581 Secondary hyperparathyroidism of renal origin: Secondary | ICD-10-CM | POA: Diagnosis not present

## 2024-05-20 DIAGNOSIS — N182 Chronic kidney disease, stage 2 (mild): Secondary | ICD-10-CM | POA: Diagnosis not present

## 2024-05-20 DIAGNOSIS — R42 Dizziness and giddiness: Secondary | ICD-10-CM | POA: Diagnosis not present

## 2024-05-20 DIAGNOSIS — D631 Anemia in chronic kidney disease: Secondary | ICD-10-CM | POA: Diagnosis not present

## 2024-05-20 DIAGNOSIS — N050 Unspecified nephritic syndrome with minor glomerular abnormality: Secondary | ICD-10-CM | POA: Diagnosis not present

## 2024-05-20 DIAGNOSIS — I10 Essential (primary) hypertension: Secondary | ICD-10-CM | POA: Diagnosis not present

## 2024-05-20 DIAGNOSIS — E1122 Type 2 diabetes mellitus with diabetic chronic kidney disease: Secondary | ICD-10-CM | POA: Diagnosis not present

## 2024-05-20 DIAGNOSIS — N1832 Chronic kidney disease, stage 3b: Secondary | ICD-10-CM | POA: Diagnosis not present

## 2024-06-02 ENCOUNTER — Other Ambulatory Visit (HOSPITAL_BASED_OUTPATIENT_CLINIC_OR_DEPARTMENT_OTHER): Payer: Self-pay | Admitting: Cardiovascular Disease

## 2024-06-02 ENCOUNTER — Other Ambulatory Visit: Payer: Self-pay | Admitting: Obstetrics and Gynecology

## 2024-06-02 DIAGNOSIS — N952 Postmenopausal atrophic vaginitis: Secondary | ICD-10-CM

## 2024-06-10 DIAGNOSIS — I129 Hypertensive chronic kidney disease with stage 1 through stage 4 chronic kidney disease, or unspecified chronic kidney disease: Secondary | ICD-10-CM | POA: Diagnosis not present

## 2024-06-10 DIAGNOSIS — N390 Urinary tract infection, site not specified: Secondary | ICD-10-CM | POA: Diagnosis not present

## 2024-06-10 DIAGNOSIS — N050 Unspecified nephritic syndrome with minor glomerular abnormality: Secondary | ICD-10-CM | POA: Diagnosis not present

## 2024-06-10 DIAGNOSIS — E1122 Type 2 diabetes mellitus with diabetic chronic kidney disease: Secondary | ICD-10-CM | POA: Diagnosis not present

## 2024-06-10 DIAGNOSIS — N1832 Chronic kidney disease, stage 3b: Secondary | ICD-10-CM | POA: Diagnosis not present

## 2024-06-10 DIAGNOSIS — D631 Anemia in chronic kidney disease: Secondary | ICD-10-CM | POA: Diagnosis not present

## 2024-06-11 ENCOUNTER — Telehealth: Payer: Self-pay | Admitting: Licensed Clinical Social Worker

## 2024-06-11 ENCOUNTER — Encounter: Payer: Self-pay | Admitting: Licensed Clinical Social Worker

## 2024-06-12 ENCOUNTER — Encounter: Payer: Self-pay | Admitting: Family Medicine

## 2024-06-12 ENCOUNTER — Ambulatory Visit (INDEPENDENT_AMBULATORY_CARE_PROVIDER_SITE_OTHER): Admitting: Family Medicine

## 2024-06-12 VITALS — BP 156/85 | HR 81 | Temp 97.9°F | Resp 18 | Ht 62.0 in | Wt 189.0 lb

## 2024-06-12 DIAGNOSIS — F32A Depression, unspecified: Secondary | ICD-10-CM

## 2024-06-12 DIAGNOSIS — E785 Hyperlipidemia, unspecified: Secondary | ICD-10-CM | POA: Diagnosis not present

## 2024-06-12 DIAGNOSIS — I10 Essential (primary) hypertension: Secondary | ICD-10-CM

## 2024-06-12 DIAGNOSIS — E039 Hypothyroidism, unspecified: Secondary | ICD-10-CM

## 2024-06-12 DIAGNOSIS — E538 Deficiency of other specified B group vitamins: Secondary | ICD-10-CM

## 2024-06-12 DIAGNOSIS — F419 Anxiety disorder, unspecified: Secondary | ICD-10-CM

## 2024-06-12 DIAGNOSIS — R600 Localized edema: Secondary | ICD-10-CM

## 2024-06-12 MED ORDER — CYANOCOBALAMIN 1000 MCG/ML IJ SOLN
1000.0000 ug | Freq: Once | INTRAMUSCULAR | Status: AC
  Administered 2024-06-12: 1000 ug via INTRAMUSCULAR

## 2024-06-12 MED ORDER — ARIPIPRAZOLE 2 MG PO TABS: 2.0000 mg | ORAL_TABLET | Freq: Every day | ORAL | 0 refills | Status: DC

## 2024-06-12 NOTE — Progress Notes (Unsigned)
 Subjective:     Patient ID: [name], female    DOB: 09-Jun-1951, 73 y.o.   MRN: [id number]  Chief Complaint  Patient presents with   Medical Management of Chronic Issues    4 week follow-up     HPI Discussed the use of AI scribe software for clinical note transcription with the patient, who gave verbal consent to proceed.  History of Present Illness [name] is a 73 year old female with nephrotic syndrome who presents with concerns about medication management .  She has a history of nephrotic syndrome, characterized by frothy urine and elevated protein levels. Recently, she consulted a kidney specialist who performed a urine test and suspected a bladder infection, for which she was prescribed antibiotics. She reports feeling 'fifty percent better' in terms of pain after taking the antibiotics, although she still experiences burning sensations. She is upset saw midlevel rather than MD  She was prescribed ethacrynic acid, a diuretic, but was unable to tolerate it due to adverse effects. She has been intermittently taking torsemide  every other day to manage fluid retention, which has improved but is still present. Intolerant of mult meds  She is currently taking Synthroid  (levothyroxine ) for her thyroid  condition   Repatha  for cholesterol management, although she missed doses of Repatha  over the past six days due to a delay in pharmacy delivery. She is also taking potassium and a multivitamin.  She experiences mental agitation and is unsure about her past use of Abilify (aripiprazole) but is open to trying it for her symptoms. Intolerant of mult other psych meds  Elevated bp-intol meds.   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 name of MD    Dementia (HCC)    Diabetes mellitus    type 2    Diverticulosis    Dyslipidemia    Dyspnea    with exertion    Dysrhythmia    skips beats   Endometrial cancer (HCC) 2021   Family history of adverse reaction to anesthesia    son stopped breathing during surgery , mother slow to wake up    Fibromyalgia 1980's   Generalized anxiety disorder    GERD (gastroesophageal reflux disease)    occ- will use Tums and Prolosec  prn    Heart murmur    Hyperlipemia    Hypertension    not on blood pressure meds due to allergies - last dose of meds- months ago    Hypothyroidism    Hypothyroidism (acquired)    Internal hemorrhoids    Irritable bowel syndrome 1980's   Liver cancer (HCC)    Major depressive disorder    Migraine headaches 1980's   On meds-well controlled   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   Morbid obesity (HCC)    Pernicious anemia    Pneumonia 2009   Several times over the past several years  PONV (postoperative nausea and vomiting)    was told by neurologist due to calcificaton in brain not to be put to sleep, N/V during Op and Recovery    Recurrent upper respiratory infection (URI)    Sleep apnea    no cpap   Subarachnoid hemorrhage (HCC) 2010   Vaginal polyp    benign per pt    Past Surgical History:  Procedure Laterality Date   BIOPSY  01/07/2024   Procedure: BIOPSY;  Surgeon: Shila Gustav GAILS, MD;  Location: [facility];  Service: Gastroenterology;;   CATARACT EXTRACTION Right    CHOLECYSTECTOMY  1983   COLONOSCOPY  2008   DB   COLONOSCOPY WITH PROPOFOL  N/A 01/07/2024   Procedure: COLONOSCOPY WITH PROPOFOL ;  Surgeon: Shila Gustav GAILS, MD;  Location: [facility];  Service:  Gastroenterology;  Laterality: N/A;   DILATION AND CURETTAGE OF UTERUS  1972   ESOPHAGOGASTRODUODENOSCOPY (EGD) WITH PROPOFOL  N/A 01/07/2024   Procedure: ESOPHAGOGASTRODUODENOSCOPY (EGD) WITH PROPOFOL ;  Surgeon: Shila Gustav GAILS, MD;  Location: [facility];  Service: Gastroenterology;  Laterality: N/A;   HYSTEROSCOPY WITH D & C  06/29/2011   Procedure: DILATATION AND CURETTAGE (D&C) /HYSTEROSCOPY;  Surgeon: Glenys GORMAN Birk, MD;  Location: [facility];  Service: Gynecology;  Laterality: N/A;   ROBOTIC ASSISTED TOTAL HYSTERECTOMY WITH BILATERAL SALPINGO OOPHERECTOMY N/A 10/27/2020   Procedure: XI ROBOTIC ASSISTED TOTAL HYSTERECTOMY WITH BILATERAL SALPINGO OOPHORECTOMY;  Surgeon: Eloy Herring, MD;  Location: [facility];  Service: Gynecology;  Laterality: N/A;   SENTINEL NODE BIOPSY N/A 10/27/2020   Procedure: SENTINEL NODE BIOPSY;  Surgeon: Eloy Herring, MD;  Location: [facility];  Service: Gynecology;  Laterality: N/A;     Current Outpatient Medications:    ARIPiprazole (ABILIFY) 2 MG tablet, Take 1 tablet (2 mg total) by mouth daily., Disp: 30 tablet, Rfl: 0   aspirin  EC 81 MG tablet, Take 81 mg by mouth daily. Swallow whole., Disp: , Rfl:    Cholecalciferol  (VITAMIN D3) 25 MCG (1000 UT) capsule, Take 1,000 Units by mouth daily., Disp: , Rfl:    estradiol  (ESTRACE ) 0.1 MG/GM vaginal cream, Place 0.5 g vaginally 2 (two) times a week. Place 1g twice a week, Disp: 42.5 g, Rfl: 11   ESTRING  7.5 MCG/24HR vaginal ring, Place 1 each vaginally every 3 (three) months., Disp: , Rfl:    Evolocumab  (REPATHA  SURECLICK) 140 MG/ML SOAJ, INJECT 1 PEN SUBCUTANEOUSLY EVERY 14 DAYS, Disp: 6 mL, Rfl: 3   glucose blood (ACCU-CHEK GUIDE) test strip, Use to test blood sugar 3 times daily, Disp: 100 each, Rfl: 2   levothyroxine  (SYNTHROID ) 50 MCG tablet, TAKE 1 TABLET BY MOUTH EVERY DAY BEFORE BREAKFAST, Disp: 90 tablet, Rfl: 3   Multiple Vitamin (MULTIVITAMIN WITH MINERALS) TABS tablet, Take 1 tablet by mouth daily., Disp: 30  tablet, Rfl: 0   potassium chloride  SA (KLOR-CON  M) 20 MEQ tablet, TAKE 1 TABLET BY MOUTH 2 TIMES DAILY, Disp: 180 tablet, Rfl: 1   torsemide  (DEMADEX ) 20 MG tablet, TAKE 1 TABLET BY MOUTH ONCE DAILY (Patient taking differently: Take 20 mg by mouth daily.), Disp: 90 tablet, Rfl: 1   chlorthalidone (HYGROTON) 25 MG tablet, Take 12.5 mg by mouth every morning. (Patient not taking: Reported on 06/12/2024), Disp: , Rfl:    MAGNESIUM  CITRATE PO, Take 1 tablet by mouth 2 (two) times daily. (Patient not taking: Reported on 06/12/2024), Disp: , Rfl:   Current Facility-Administered Medications:    cyanocobalamin  (VITAMIN B12) injection 1,000 mcg, 1,000 mcg, Intramuscular, Q30 days, Wendolyn Jenkins Jansky,  MD, 1,000 mcg at 05/15/24 1109   cyanocobalamin  (VITAMIN B12) injection 1,000 mcg, 1,000 mcg, Intramuscular, Once, Wendolyn Jenkins Jansky, MD  Allergies  Allergen Reactions   Atenolol Other (See Comments)    Stomach problems, chills   Atorvastatin Other (See Comments)    Retained fluid   Canagliflozin Other (See Comments)    ALL DIABETIC MEDS PER PT   abdominal pain, headache   Losartan Potassium Other (See Comments)    chills, HA, stomach pain   Acarbose Nausea Only and Other (See Comments)     headache,nausea   Cinnamon Other (See Comments)    Stomach pain   Empagliflozin Other (See Comments)     headaches   Glimepiride Other (See Comments)    ALL DIABETIC MEDS PER PT  abdominal pain, headache   Metformin And Related Nausea And Vomiting and Other (See Comments)    Patient states that she has severe chills, headache and cramping additionally. Says blood sugar is uncontrolled because of this   Other Other (See Comments)    ALL DIABETIC MEDS PER PT    Pioglitazone Other (See Comments)    ALL DIABETIC MEDS PER PT = peripheral edema   Pravastatin Other (See Comments)    Retained fluid   Sitagliptin Other (See Comments)    ALL DIABETIC MEDS PER PT  Other reaction(s): headache   Spironolactone-Hctz  Other (See Comments)   Statins Other (See Comments)    Myalgia   Sulfamethoxazole Other (See Comments)    ALL DIABETIC MEDS PER PT    Tramadol  Hcl Itching   Atorvastatin Calcium  Other (See Comments)     retain fld   Augmentin  [Amoxicillin -Pot Clavulanate] Other (See Comments)    Caused jaundice   Crestor [Rosuvastatin] Other (See Comments)   Rosuvastatin Calcium     Torsemide  Nausea Only    Gi upset, chills, abd pain,bloating   Bumex  [Bumetanide ] Rash and Other (See Comments)    Chills, rash, headaches, stomach issues also   Lasix  [Furosemide ] Rash and Other (See Comments)    Sugars increased   Sulfa Antibiotics Rash and Other (See Comments)    Mother has told patient in the past not to take, she cannot recall there reaction   ROS neg/noncontributory except as noted HPI/below      Objective:     BP (!) 156/85   Pulse 81   Temp 97.9 F (36.6 C) (Temporal)   Resp 18   Ht 5' 2 (1.575 m)   Wt 189 lb (85.7 kg)   SpO2 97%   BMI 34.57 kg/m  Wt Readings from Last 3 Encounters:  06/12/24 189 lb (85.7 kg)  05/15/24 194 lb 2 oz (88.1 kg)  04/24/24 182 lb (82.6 kg)    Physical Exam   Gen: WDWN NAD HEENT: NCAT, conjunctiva not injected, sclera nonicteric NECK:  supple, no thyromegaly, no nodes, no carotid bruits CARDIAC: RRR, S1S2+, no murmur. DP 1+B LUNGS: CTAB. No wheezes ABDOMEN:  BS+, soft, NTND, No HSM, no masses EXT:  1+ pitting edema MSK: cane  NEURO: A&O x3.  CN II-XII intact.  PSYCH: normal mood. Good eye contact     Assessment & Plan:  Essential hypertension  B12 deficiency -     Cyanocobalamin   Local edema  Acquired hypothyroidism  Dyslipidemia  Anxiety and depression  Other orders -     ARIPiprazole; Take 1 tablet (2 mg total) by mouth daily.  Dispense: 30 tablet; Refill: 0  Assessment and Plan Assessment & Plan Nephrotic Syndrome  Nephrotic syndrome with proteinuria shows improvement, as recent urine samples indicate decreased protein  levels, likely due to antibiotic treatment. She reports a 50% reduction in pain but continues to experience bladder discomfort, possibly related to a prolapsed vagina. Continue monitoring urine protein levels and follow up with the nephrologist as scheduled.  Urinary Tract Infection   A previously diagnosed urinary tract infection has cleared following antibiotic treatment. Monitor for recurrence of symptoms and follow up with the primary care provider if symptoms return.  Fluid Retention   She experiences fluid retention, previously managed with intermittent diuretic use. Start chlorthalidone 25 mg to address fluid retention as per neph-reviewed meds and hasn't tried this one. . Monitor fluid retention and adjust treatment as necessary.  Hypertension   Hypertension-chronic.  Not controlled.  Intolerant meds.  Has see card, etc.  Start chlorthalidone 25 mg and monitor blood pressure regularly. Discuss any side effects with the neph.  Hypothyroidism   She continues on levothyroxine  (Synthroid ) for hypothyroidism as prescribed. Monitor thyroid  function tests regularly.  Hyperlipidemia   She is on Repatha  for cholesterol management but missed doses due to pharmacy supply issues. Resume Repatha  as soon as it is delivered and monitor lipid levels regularly.  Depression   She experiences agitation and may need an antidepressant. start Abilify, she will start a low dose of 1 mg to help with depression. Monitor mood and agitation and follow up with the primary care provider to assess effectiveness.    Return in about 4 weeks (around 07/10/2024) for chronic follow-up.  Jenkins CHRISTELLA Carrel, MD

## 2024-06-12 NOTE — Patient Instructions (Signed)
 Try the chlorthalidone for few days, then Start the abilify 1/2 tab per day in 3 days

## 2024-06-16 ENCOUNTER — Other Ambulatory Visit: Payer: Self-pay | Admitting: Family Medicine

## 2024-06-22 ENCOUNTER — Other Ambulatory Visit: Payer: Self-pay | Admitting: Licensed Clinical Social Worker

## 2024-06-22 NOTE — Patient Outreach (Signed)
 Complex Care Management   Visit Note  06/22/2024  Name:  [name] MRN: [id number] DOB: 11-Nov-1951  Situation: Referral received for Complex Care Management related to SDOH Barriers:  Financial Resource Strain I obtained verbal consent from Patient.  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 name of MD    Dementia (HCC)    Diabetes mellitus    type 2    Diverticulosis    Dyslipidemia    Dyspnea    with exertion    Dysrhythmia    skips beats   Endometrial cancer (HCC) 2021   Family history of adverse reaction to anesthesia    son stopped breathing during surgery , mother slow to wake up    Fibromyalgia 1980's   Generalized anxiety disorder    GERD (gastroesophageal reflux disease)    occ- will use Tums and Prolosec  prn    Heart murmur    Hyperlipemia    Hypertension    not on blood pressure meds due to allergies - last dose of meds- months ago    Hypothyroidism    Hypothyroidism (acquired)    Internal hemorrhoids    Irritable bowel syndrome 1980's   Liver cancer (HCC)    Major depressive disorder    Migraine headaches 1980's   On meds-well controlled   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   Morbid obesity (HCC)    Pernicious anemia    Pneumonia 2009   Several times over the past several years   PONV (postoperative nausea and vomiting)    was told by neurologist due to calcificaton in brain not to be put to sleep, N/V during Op and Recovery    Recurrent upper respiratory infection (URI)    Sleep apnea    no cpap   Subarachnoid hemorrhage (HCC) 2010   Vaginal polyp    benign per pt     Assessment: Patient Reported Symptoms:  Cognitive Cognitive Status: Alert and oriented to person, place, and time, Normal speech and language skills, Insightful and able to interpret abstract concepts      Neurological Neurological Review of Symptoms: Not assessed    HEENT HEENT Symptoms Reported: Not assessed      Cardiovascular Cardiovascular Symptoms Reported: Not assessed    Respiratory Respiratory Symptoms Reported: Not assesed    Endocrine Endocrine Symptoms Reported: Not assessed    Gastrointestinal Gastrointestinal Symptoms Reported: Not assessed      Genitourinary Genitourinary Symptoms Reported: Not assessed    Integumentary Integumentary Symptoms Reported: Not assessed    Musculoskeletal Musculoskelatal Symptoms Reviewed: Not assessed        Psychosocial Psychosocial Symptoms Reported: Not assessed            05/15/2024   10:29 AM  Depression screen PHQ 2/9  Decreased Interest 1  Down, Depressed, Hopeless 1  PHQ - 2 Score 2  Altered sleeping 1  Tired, decreased energy 1  Change in appetite 0  Feeling bad or failure about yourself  1  Trouble concentrating 0  Moving  slowly or fidgety/restless 1  Suicidal thoughts 0  PHQ-9 Score 6  Difficult doing work/chores Somewhat difficult    There were no vitals filed for this visit.  Medications Reviewed Today     Reviewed by Kit Alm LABOR, LCSW (Social Worker) on 06/22/24 at [phone]  Med List Status: <None>   Medication Order Taking? Sig Documenting Provider Last Dose Status Informant  ARIPiprazole  (ABILIFY ) 2 MG tablet [id number]  Take 1 tablet (2 mg total) by mouth daily. Wendolyn Jenkins Jansky, MD  Active   aspirin  EC 81 MG tablet [id number]  Take 81 mg by mouth daily. Swallow whole. [provider]  Active   chlorthalidone (HYGROTON) 25 MG tablet [id number]  Take 12.5 mg by mouth every morning.  Patient not taking: Reported on 06/12/2024   [provider]  Active   Cholecalciferol   (VITAMIN D3) 25 MCG (1000 UT) capsule [id number]  Take 1,000 Units by mouth daily. [provider]  Active Self  cyanocobalamin  (VITAMIN B12) injection 1,000 mcg [id number]   Wendolyn Jenkins Jansky, MD  Active   cyanocobalamin  (VITAMIN B12) injection 1,000 mcg [id number]   Wendolyn Jenkins Jansky, MD  Active   estradiol  (ESTRACE ) 0.1 MG/GM vaginal cream [id number]  Place 0.5 g vaginally 2 (two) times a week. Place 1g twice a week Zuleta, [name] G, NP  Active   ESTRING  7.5 MCG/24HR vaginal ring [id number]  Place 1 each vaginally every 3 (three) months. [provider]  Active   Evolocumab  (REPATHA  SURECLICK) 140 MG/ML SOAJ [id number]  INJECT 1 PEN SUBCUTANEOUSLY EVERY 14 DAYS Wendolyn Jenkins Jansky, MD  Active   glucose blood (ACCU-CHEK GUIDE) test strip [id number]  Use to test blood sugar 3 times daily Wendolyn Jenkins Jansky, MD  Active Self  levothyroxine  (SYNTHROID ) 50 MCG tablet [id number]  TAKE 1 TABLET BY MOUTH EVERY DAY BEFORE BREAKFAST Wendolyn Jenkins Jansky, MD  Active   MAGNESIUM  CITRATE PO [id number]  Take 1 tablet by mouth 2 (two) times daily.  Patient not taking: Reported on 06/12/2024   [provider]  Active Self           Med Note (RATLIFF, THURSHELL   Fri Nov 01, 2023  1:24 PM) As needed  Multiple Vitamin (MULTIVITAMIN WITH MINERALS) TABS tablet [id number]  Take 1 tablet by mouth daily. Lorilee Sven SQUIBB, MD  Active Self  potassium chloride  SA (KLOR-CON  M) 20 MEQ tablet [id number]  TAKE 1 TABLET BY MOUTH 2 TIMES DAILY Wendolyn Jenkins Jansky, MD  Active   torsemide  (DEMADEX ) 20 MG tablet [id number]  TAKE 1 TABLET BY MOUTH ONCE DAILY  Patient taking differently: Take 20 mg by mouth daily.   Webb, Padonda B, FNP  Active   Med List Note Stephan Kay, [place] 10/28/20 1427):  Recommendation:   Pt reported she will make a self-referral to the aging gracefully  program once she has met the financial eligibility requirements. Pt anticipates this will take a few months. Pt declined any follow up from CSW at this time. CSW encouraged her to reach out with any changes or concerns.  Follow Up Plan:   Patient has met all care management goals. Care Management case will be closed. Patient has been provided contact information should new needs arise.   Alm Armor, LCSW [place], [facility] Licensed Clinical Social Worker Care Coordinator [phone]

## 2024-06-22 NOTE — Patient Instructions (Signed)
 Visit Information  Thank you for taking time to visit with me today. Please don't hesitate to contact me if I can be of assistance to you before our next scheduled appointment.  Your next care management appointment is no further scheduled appointments.   Please call the care guide team at [phone] if you need to cancel, schedule, or reschedule an appointment.   Please call the Suicide and Crisis Lifeline: 988 if you are experiencing a Mental Health or Behavioral Health Crisis or need someone to talk to.  Hale Level, LCSW [place], [facility] Licensed Clinical Social Worker Care Coordinator [phone]

## 2024-07-06 ENCOUNTER — Ambulatory Visit: Admitting: Obstetrics and Gynecology

## 2024-07-14 ENCOUNTER — Ambulatory Visit (INDEPENDENT_AMBULATORY_CARE_PROVIDER_SITE_OTHER): Admitting: Gastroenterology

## 2024-07-14 ENCOUNTER — Encounter: Payer: Self-pay | Admitting: Gastroenterology

## 2024-07-14 ENCOUNTER — Ambulatory Visit (INDEPENDENT_AMBULATORY_CARE_PROVIDER_SITE_OTHER): Admitting: Podiatry

## 2024-07-14 VITALS — BP 138/80 | HR 94 | Ht 62.0 in | Wt 190.0 lb

## 2024-07-14 DIAGNOSIS — Z91198 Patient's noncompliance with other medical treatment and regimen for other reason: Secondary | ICD-10-CM

## 2024-07-14 DIAGNOSIS — K580 Irritable bowel syndrome with diarrhea: Secondary | ICD-10-CM

## 2024-07-14 DIAGNOSIS — K8681 Exocrine pancreatic insufficiency: Secondary | ICD-10-CM | POA: Diagnosis not present

## 2024-07-14 NOTE — Progress Notes (Signed)
 [name]    [id number]    [birth year]  Primary Care Physician:Kulik, Jenkins Jansky, MD  Referring Physician: Wendolyn Jenkins Jansky, MD [address] [place],  [place] [address]   Chief complaint:  IBS  History of Present Illness [name] is a 73 year old female who presents for follow-up on her gastrointestinal symptoms and medication management.  Chronic diarrhea - Ongoing diarrhea managed with Pepto Bismol and Imodium - Without medication, experiences up to ten bowel movements daily - With Pepto Bismol and Imodium, bowel movements decrease to about once daily - Pepto Bismol taken once daily; Imodium taken once daily, particularly before meals - Pepto Bismol causes dark, tarry stools, which were also present intermittently prior to use  Recent UTI - Past UTI treated with antibiotics prescribed by nephrologist - Initial lack of awareness regarding infection and prescription; learned of it through pharmacy - Significant improvement in symptoms after completing antibiotic course - Subsequent testing showed no recurrence of infection  Peripheral edema and fluid retention - History of fluid retention and swelling - Diuretic taken approximately once every three days - Sensitivity to diuretic, but medication effectively reduces fluid retention  Adverse drug reaction (pancreatic enzyme replacement) - Past adverse reaction to Zenpep  characterized by severe chills, itching, and pain - Continued medication for several days despite symptoms before discontinuing  Dizziness and fall-related injury - History of dizziness - Recent severe accident related to dizziness  Cognitive impairment - Memory issues present - Family history of cognitive impairment; mother underwent carotid artery surgery  Cardiac care concerns - No recent cardiology evaluation - Dissatisfaction with previous cardiologist - Considering finding a new cardiologist  GI history: EGD  January 07, 2024 - Esophageal ulcers with no bleeding and no stigmata of recent bleeding.  - 2 cm hiatal hernia. - Gastritis. Biopsied.  - Erythematous duodenopathy.   Colonoscopy January 07, 2024 - Normal mucosa in the entire examined colon. Biopsied. - Moderate diverticulosis in the sigmoid colon, in the descending colon, in the transverse colon and in the ascending colon. - Non- bleeding external and internal hemorrhoids.   A. GASTRIC ANTRUM AND BODY, BIOPSY:  Reactive gastropathy and minimal chronic gastritis with lymphoid  aggregate  Negative for H. pylori, intestinal metaplasia, dysplasia and carcinoma   B. RIGHT COLON, BIOPSY:  Melanosis coli   C. LEFT COLON, BIOPSY:  Melanosis coli    MRI abdomen/MRCP with and without contrast March 27, 2023 1. Common bile duct is dilated up to 12 mm, not substantially changed dating back to 12/04/2021 and tapering to the level of the ampulla. No definite filling defect or focal mass lesion. 2. Mild splenomegaly, decreased from 15.2 cm on 03/26/2023. 3. Colonic diverticulosis without acute diverticulitis.  Outpatient Encounter Medications as of 07/14/2024  Medication Sig   aspirin  EC 81 MG tablet Take 81 mg by mouth daily. Swallow whole.   Cholecalciferol  (VITAMIN D3) 25 MCG (1000 UT) capsule Take 1,000 Units by mouth daily.   estradiol  (ESTRACE ) 0.1 MG/GM vaginal cream Place 0.5 g vaginally 2 (two) times a week. Place 1g twice a week   ESTRING  7.5 MCG/24HR vaginal ring Place 1 each vaginally every 3 (three) months.   Evolocumab  (REPATHA  SURECLICK) 140 MG/ML SOAJ INJECT 1 PEN SUBCUTANEOUSLY EVERY 14 DAYS   glucose blood (ACCU-CHEK GUIDE) test strip Use to test blood sugar 3 times daily   levothyroxine  (SYNTHROID ) 50 MCG tablet TAKE 1 TABLET BY MOUTH EVERY DAY BEFORE  BREAKFAST   Multiple Vitamin (MULTIVITAMIN WITH MINERALS) TABS tablet Take 1 tablet by mouth daily.   potassium chloride  SA (KLOR-CON  M) 20 MEQ tablet TAKE 1 TABLET BY MOUTH 2  TIMES DAILY   torsemide  (DEMADEX ) 20 MG tablet TAKE 1 TABLET BY MOUTH ONCE DAILY   [DISCONTINUED] ARIPiprazole  (ABILIFY ) 2 MG tablet Take 1 tablet (2 mg total) by mouth daily.   [DISCONTINUED] chlorthalidone (HYGROTON) 25 MG tablet Take 12.5 mg by mouth every morning. (Patient not taking: Reported on 06/12/2024)   [DISCONTINUED] MAGNESIUM  CITRATE PO Take 1 tablet by mouth 2 (two) times daily. (Patient not taking: Reported on 06/12/2024)   Facility-Administered Encounter Medications as of 07/14/2024  Medication   cyanocobalamin  (VITAMIN B12) injection 1,000 mcg   cyanocobalamin  (VITAMIN B12) injection 1,000 mcg    Allergies as of 07/14/2024 - Review Complete 07/14/2024  Allergen Reaction Noted   Atenolol Other (See Comments) 09/29/2019   Atorvastatin Other (See Comments) 05/20/2018   Canagliflozin Other (See Comments) 05/20/2018   Losartan potassium Other (See Comments) 05/20/2018   Acarbose Nausea Only and Other (See Comments) 05/20/2018   Cinnamon Other (See Comments) 06/15/2015   Empagliflozin Other (See Comments) 11/28/2021   Glimepiride Other (See Comments) 05/20/2018   Metformin and related Nausea And Vomiting and Other (See Comments) 06/26/2011   Other Other (See Comments) 04/15/2020   Pioglitazone Other (See Comments) 04/15/2020   Pravastatin Other (See Comments) 05/20/2018   Sitagliptin Other (See Comments) 05/20/2018   Spironolactone-hctz Other (See Comments) 11/28/2021   Statins Other (See Comments) 10/14/2020   Sulfamethoxazole Other (See Comments) 03/24/2007   Tramadol  hcl Itching 10/29/2020   Atorvastatin calcium  Other (See Comments) 11/28/2021   Augmentin  [amoxicillin -pot clavulanate] Other (See Comments) 03/27/2023   Crestor [rosuvastatin] Other (See Comments) 11/28/2021   Rosuvastatin calcium   03/14/2022   Torsemide  Nausea Only 07/02/2023   Bumex  [bumetanide ] Rash and Other (See Comments) 05/04/2022   Lasix  [furosemide ] Rash and Other (See Comments) 07/24/2022    Sulfa antibiotics Rash and Other (See Comments) 06/16/2011    Past Medical History:  Diagnosis Date   Allergy    Anemia    Arthritis    Asthma    Dianosed years ago-no meds at this time   Cancer [facility]) 2021   endometrial    Cataract    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 name of MD    Dementia (HCC)    Diabetes mellitus    type 2    Diverticulosis    Dyslipidemia    Dyspnea    with exertion    Dysrhythmia    skips beats   Endometrial cancer (HCC) 2021   Family history of adverse reaction to anesthesia    son stopped breathing during surgery , mother slow to wake up    Fibromyalgia 1980's   Generalized anxiety disorder    GERD (gastroesophageal reflux disease)    occ- will use Tums and Prolosec  prn    Heart murmur    Hyperlipemia    Hypertension    not on blood pressure meds due to allergies - last dose of meds- months ago    Hypothyroidism    Hypothyroidism (acquired)    Internal hemorrhoids    Irritable bowel syndrome 1980's   Liver cancer (HCC)    Major depressive disorder    Migraine headaches 1980's   On  meds-well controlled   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   Morbid obesity (HCC)    Pernicious anemia    Pneumonia 2009   Several times over the past several years   PONV (postoperative nausea and vomiting)    was told by neurologist due to calcificaton in brain not to be put to sleep, N/V during Op and Recovery    Recurrent upper respiratory infection (URI)    Sleep apnea    no cpap   Subarachnoid hemorrhage (HCC) 2010   Vaginal polyp    benign per pt    Past Surgical History:  Procedure Laterality Date   BIOPSY  01/07/2024   Procedure: BIOPSY;  Surgeon: Shila Gustav GAILS, MD;  Location: [facility];  Service: Gastroenterology;;   CATARACT EXTRACTION Right    CHOLECYSTECTOMY  1983   COLONOSCOPY  2008    DB   COLONOSCOPY WITH PROPOFOL  N/A 01/07/2024   Procedure: COLONOSCOPY WITH PROPOFOL ;  Surgeon: Shila Gustav GAILS, MD;  Location: [facility];  Service: Gastroenterology;  Laterality: N/A;   DILATION AND CURETTAGE OF UTERUS  1972   ESOPHAGOGASTRODUODENOSCOPY (EGD) WITH PROPOFOL  N/A 01/07/2024   Procedure: ESOPHAGOGASTRODUODENOSCOPY (EGD) WITH PROPOFOL ;  Surgeon: Shila Gustav GAILS, MD;  Location: [facility];  Service: Gastroenterology;  Laterality: N/A;   HYSTEROSCOPY WITH D & C  06/29/2011   Procedure: DILATATION AND CURETTAGE (D&C) /HYSTEROSCOPY;  Surgeon: Glenys GORMAN Birk, MD;  Location: [facility];  Service: Gynecology;  Laterality: N/A;   ROBOTIC ASSISTED TOTAL HYSTERECTOMY WITH BILATERAL SALPINGO OOPHERECTOMY N/A 10/27/2020   Procedure: XI ROBOTIC ASSISTED TOTAL HYSTERECTOMY WITH BILATERAL SALPINGO OOPHORECTOMY;  Surgeon: Eloy Herring, MD;  Location: [facility];  Service: Gynecology;  Laterality: N/A;   SENTINEL NODE BIOPSY N/A 10/27/2020   Procedure: SENTINEL NODE BIOPSY;  Surgeon: Eloy Herring, MD;  Location: [facility];  Service: Gynecology;  Laterality: N/A;    Family History  Problem Relation Age of Onset   Anesthesia problems Mother        hard to wake post op    Cerebrovascular Disease Mother        Never had stroke, but had CEA.   Liver cancer Father        mets to liver    Bladder Cancer Father    Colon cancer Father        mets to colon    Schizophrenia Daughter        Schizoaffective disorder   Anesthesia problems Son        stopped breathing post op    Tremor Son    Anesthesia problems Granddaughter        PONV   High blood pressure Other    Diabetes Other    Arthritis Other    Schizophrenia Child    Tremor Child    Dementia Neg Hx    Esophageal cancer Neg Hx    Rectal cancer Neg Hx    Stomach cancer Neg Hx    Colon polyps Neg Hx     Social History   Socioeconomic History   Marital status: Divorced    Spouse name: Not on file   Number of children: 3   Years of  education: 12   Highest education level: Not on file  Occupational History   Occupation: Retired     Comment: nanny   Occupation: retired  Tobacco Use   Smoking status: Never   Smokeless tobacco: Never  Vaping Use   Vaping status: Never Used  Substance and Sexual Activity  Alcohol use: Not Currently    Comment: quit 1978   Drug use: Never   Sexual activity: Not Currently  Other Topics Concern   Not on file  Social History Narrative    Lives at home. Her granddaughter lives with her and daughter    Right handed   Social Drivers of Health   Financial Resource Strain: Low Risk  (01/28/2024)   Overall Financial Resource Strain (CARDIA)    Difficulty of Paying Living Expenses: Not hard at all  Food Insecurity: No Food Insecurity (04/24/2024)   Hunger Vital Sign    Worried About Running Out of Food in the Last Year: Never true    Ran Out of Food in the Last Year: Never true  Transportation Needs: No Transportation Needs (04/24/2024)   PRAPARE - Administrator, Civil Service (Medical): No    Lack of Transportation (Non-Medical): No  Physical Activity: Not on file  Stress: Stress Concern Present (01/28/2024)   [employer] of Occupational Health - Occupational Stress Questionnaire    Feeling of Stress : To some extent  Social Connections: Moderately Isolated (01/28/2024)   Social Connection and Isolation Panel    Frequency of Communication with Friends and Family: Twice a week    Frequency of Social Gatherings with Friends and Family: More than three times a week    Attends Religious Services: 1 to 4 times per year    Active Member of [employer] or Organizations: No    Attends Banker Meetings: Never    Marital Status: Divorced  Catering manager Violence: Not At Risk (04/24/2024)   Humiliation, Afraid, Rape, and Kick questionnaire    Fear of Current or Ex-Partner: No    Emotionally Abused: No    Physically Abused: No    Sexually Abused: No      Review  of systems: All other review of systems negative except as mentioned in the HPI.   Physical Exam: Vitals:   07/14/24 1437  BP: 138/80  Pulse: 94   Body mass index is 34.75 kg/m. Gen:      No acute distress HEENT:  sclera anicteric Abd:      soft, non-tender; no palpable masses, no distension Ext:    + edema Neuro: alert and oriented x 3 Psych: normal mood and affect  Data Reviewed:  Reviewed labs, radiology imaging, old records and pertinent past GI work up     Assessment and Plan Assessment & Plan  73 year old female with multiple comorbidities, chronic IBS diarrhea  Chronic diarrhea: History of extensive workup, negative for colitis.  She did not tolerate Zenpep  for pancreatic insufficiency  Chronic diarrhea managed with Pepto Bismol and Imodium. Current regimen reduces bowel movements from ten to one per day. Adjustments to dosing are necessary to avoid constipation and ensure effective management. - Continue Pepto Bismol one tablet in the morning and one in the evening as needed. - Use Imodium only if leaving the house or as needed for additional control. - Write prescription for Pepto Bismol to potentially cover through insurance.  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 Previous enzyme supplementation caused adverse reactions including chills, itching, and pain. Transplant is not an option due to surgical candidacy. Current management focuses on symptom control with Pepto Bismol. - Avoid enzyme supplementation due to adverse reactions. - Continue current management with Pepto Bismol for symptom control.   Return as needed      This visit required 40  minutes of patient care (this includes precharting, chart review, review of results, face-to-face time used for counseling as well as treatment plan and follow-up. The patient was provided an opportunity to ask questions and all  were answered. The patient agreed with the plan and demonstrated an understanding of the instructions.  LOIS Wilkie Mcgee , MD    CC: Wendolyn Jenkins Jansky, MD

## 2024-07-14 NOTE — Patient Instructions (Signed)
 VISIT SUMMARY:  Today, we discussed your ongoing gastrointestinal symptoms, medication management, and other health concerns. We reviewed your chronic diarrhea, pancreatic exocrine insufficiency, peripheral edema, and other related issues.  YOUR PLAN:  CHRONIC DIARRHEA: You have ongoing diarrhea that is currently managed with Pepto Bismol and Imodium. -Continue taking Pepto Bismol one tablet in the morning and one in the evening as needed. -Use Imodium only if you are leaving the house or need additional control. -We will write a prescription for Pepto Bismol to see if it can be covered by your insurance.  PANCREATIC EXOCRINE INSUFFICIENCY: You have pancreatic exocrine insufficiency which affects your enzyme production and digestion. Previous enzyme supplements caused adverse reactions. -Avoid enzyme supplementation due to past adverse reactions. -Continue managing symptoms with Pepto Bismol.  PERIPHERAL EDEMA: You have fluid retention and swelling that is managed with a diuretic. -Continue taking your diuretic as tolerated, approximately once every three days. -Contact the cardiology office to assign a new cardiologist for further follow-up.

## 2024-07-15 ENCOUNTER — Encounter: Payer: Self-pay | Admitting: Family Medicine

## 2024-07-15 ENCOUNTER — Ambulatory Visit: Admitting: Family Medicine

## 2024-07-15 VITALS — BP 140/68 | HR 84 | Temp 98.3°F | Resp 18 | Ht 62.0 in | Wt 189.2 lb

## 2024-07-15 DIAGNOSIS — R600 Localized edema: Secondary | ICD-10-CM

## 2024-07-15 DIAGNOSIS — R079 Chest pain, unspecified: Secondary | ICD-10-CM | POA: Diagnosis not present

## 2024-07-15 DIAGNOSIS — E785 Hyperlipidemia, unspecified: Secondary | ICD-10-CM

## 2024-07-15 DIAGNOSIS — E538 Deficiency of other specified B group vitamins: Secondary | ICD-10-CM | POA: Diagnosis not present

## 2024-07-15 DIAGNOSIS — E1122 Type 2 diabetes mellitus with diabetic chronic kidney disease: Secondary | ICD-10-CM

## 2024-07-15 DIAGNOSIS — N1831 Chronic kidney disease, stage 3a: Secondary | ICD-10-CM | POA: Diagnosis not present

## 2024-07-15 DIAGNOSIS — G720 Drug-induced myopathy: Secondary | ICD-10-CM

## 2024-07-15 DIAGNOSIS — K8681 Exocrine pancreatic insufficiency: Secondary | ICD-10-CM | POA: Diagnosis not present

## 2024-07-15 MED ORDER — CYANOCOBALAMIN 1000 MCG/ML IJ SOLN: 1000.0000 ug | Freq: Once | INTRAMUSCULAR | Status: DC

## 2024-07-15 NOTE — Progress Notes (Signed)
 Subjective:     Patient ID: [name], female    DOB: 02-Dec-1951, 73 y.o.   MRN: [id number]  Chief Complaint  Patient presents with   Medical Management of Chronic Issues    4 week follow-up     HPI Discussed the use of AI scribe software for clinical note transcription with the patient, who gave verbal consent to proceed.  History of Present Illness [name] is a 73 year old female with chronic kidney disease and diabetes who presents with chest pain and medication management issues.  She has been experiencing chest pain for the past four to five months, described as 'twinges' that occur randomly without specific triggers. The pain can occur while sitting or walking and is not associated with shortness of breath, dizziness, nausea, or sweating.  She reports taking a diuretic approximately every four days due to side effects and takes potassium twice daily. Her weight has fluctuated, recently increasing to 189 pounds from 179 pounds.  She has pancreatic insufficiency and has been on medications prescribed by a gastroenterologist, but they have not been effective d/t SE. She reports that there is only so much diet and exercise she can do for her diabetes.  She is currently taking Repatha  for cholesterol management, with the last LDL level recorded at 34 in December. She does not have a current cardiologist after her previous one retired and is due for a carotid artery test in August.  She has experienced issues with medication sensitivity, particularly with Abilify , which she tried but could not tolerate. She is frustrated with the ongoing challenges in finding effective medications.  She had a previous urinary tract infection diagnosed by a nephrologist, treated with antibiotics, leading to symptom improvement. However, she was not informed of the infection until after the treatment was prescribed.  She mentions a recent experience with a healthcare provider where  she received incorrect paperwork, adding to her frustration with her medical care.    Health Maintenance Due  Topic Date Due   OPHTHALMOLOGY EXAM  Never done   Diabetic kidney evaluation - Urine ACR  01/24/2024    Past Medical History:  Diagnosis Date   Allergy    Anemia    Arthritis    Asthma    Dianosed years ago-no meds at this time   Cancer [facility]) 2021   endometrial    Cataract    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 name of MD    Dementia (HCC)    Diabetes mellitus    type 2    Diverticulosis    Dyslipidemia    Dyspnea    with exertion    Dysrhythmia    skips beats   Endometrial cancer (HCC) 2021   Family history of adverse reaction to anesthesia    son stopped breathing during surgery , mother slow to wake up    Fibromyalgia 1980's   Generalized anxiety disorder    GERD (gastroesophageal reflux disease)    occ- will use Tums and Prolosec  prn    Heart murmur    Hyperlipemia    Hypertension    not on blood pressure meds due to allergies - last dose of meds- months ago    Hypothyroidism    Hypothyroidism (acquired)    Internal hemorrhoids    Irritable bowel syndrome 1980's  Liver cancer (HCC)    Major depressive disorder    Migraine headaches 1980's   On meds-well controlled   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   Morbid obesity (HCC)    Pernicious anemia    Pneumonia 2009   Several times over the past several years   PONV (postoperative nausea and vomiting)    was told by neurologist due to calcificaton in brain not to be put to sleep, N/V during Op and Recovery    Recurrent upper respiratory infection (URI)    Sleep apnea    no cpap   Subarachnoid hemorrhage (HCC) 2010   Vaginal polyp    benign per pt    Past Surgical History:  Procedure Laterality Date   BIOPSY  01/07/2024   Procedure: BIOPSY;  Surgeon:  Shila Gustav GAILS, MD;  Location: [facility];  Service: Gastroenterology;;   CATARACT EXTRACTION Right    CHOLECYSTECTOMY  1983   COLONOSCOPY  2008   DB   COLONOSCOPY WITH PROPOFOL  N/A 01/07/2024   Procedure: COLONOSCOPY WITH PROPOFOL ;  Surgeon: Shila Gustav GAILS, MD;  Location: [facility];  Service: Gastroenterology;  Laterality: N/A;   DILATION AND CURETTAGE OF UTERUS  1972   ESOPHAGOGASTRODUODENOSCOPY (EGD) WITH PROPOFOL  N/A 01/07/2024   Procedure: ESOPHAGOGASTRODUODENOSCOPY (EGD) WITH PROPOFOL ;  Surgeon: Shila Gustav GAILS, MD;  Location: [facility];  Service: Gastroenterology;  Laterality: N/A;   HYSTEROSCOPY WITH D & C  06/29/2011   Procedure: DILATATION AND CURETTAGE (D&C) /HYSTEROSCOPY;  Surgeon: Glenys GORMAN Birk, MD;  Location: [facility];  Service: Gynecology;  Laterality: N/A;   ROBOTIC ASSISTED TOTAL HYSTERECTOMY WITH BILATERAL SALPINGO OOPHERECTOMY N/A 10/27/2020   Procedure: XI ROBOTIC ASSISTED TOTAL HYSTERECTOMY WITH BILATERAL SALPINGO OOPHORECTOMY;  Surgeon: Eloy Herring, MD;  Location: [facility];  Service: Gynecology;  Laterality: N/A;   SENTINEL NODE BIOPSY N/A 10/27/2020   Procedure: SENTINEL NODE BIOPSY;  Surgeon: Eloy Herring, MD;  Location: [facility];  Service: Gynecology;  Laterality: N/A;     Current Outpatient Medications:    aspirin  EC 81 MG tablet, Take 81 mg by mouth daily. Swallow whole., Disp: , Rfl:    Cholecalciferol  (VITAMIN D3) 25 MCG (1000 UT) capsule, Take 1,000 Units by mouth daily., Disp: , Rfl:    estradiol  (ESTRACE ) 0.1 MG/GM vaginal cream, Place 0.5 g vaginally 2 (two) times a week. Place 1g twice a week, Disp: 42.5 g, Rfl: 11   ESTRING  7.5 MCG/24HR vaginal ring, Place 1 each vaginally every 3 (three) months., Disp: , Rfl:    Evolocumab  (REPATHA  SURECLICK) 140 MG/ML SOAJ, INJECT 1 PEN SUBCUTANEOUSLY EVERY 14 DAYS, Disp: 6 mL, Rfl: 3   glucose blood (ACCU-CHEK GUIDE) test strip, Use to test blood sugar 3 times daily, Disp: 100 each, Rfl: 2   levothyroxine   (SYNTHROID ) 50 MCG tablet, TAKE 1 TABLET BY MOUTH EVERY DAY BEFORE BREAKFAST, Disp: 90 tablet, Rfl: 3   Multiple Vitamin (MULTIVITAMIN WITH MINERALS) TABS tablet, Take 1 tablet by mouth daily., Disp: 30 tablet, Rfl: 0   potassium chloride  SA (KLOR-CON  M) 20 MEQ tablet, TAKE 1 TABLET BY MOUTH 2 TIMES DAILY, Disp: 180 tablet, Rfl: 1   torsemide  (DEMADEX ) 20 MG tablet, TAKE 1 TABLET BY MOUTH ONCE DAILY, Disp: 90 tablet, Rfl: 1  Allergies  Allergen Reactions   Atenolol Other (See Comments)    Stomach problems, chills   Atorvastatin Other (See Comments)    Retained fluid   Canagliflozin Other (See Comments)    ALL DIABETIC MEDS PER PT   abdominal pain, headache  Losartan Potassium Other (See Comments)    chills, HA, stomach pain   Acarbose Nausea Only and Other (See Comments)     headache,nausea   Cinnamon Other (See Comments)    Stomach pain   Empagliflozin Other (See Comments)     headaches   Glimepiride Other (See Comments)    ALL DIABETIC MEDS PER PT  abdominal pain, headache   Metformin And Related Nausea And Vomiting and Other (See Comments)    Patient states that she has severe chills, headache and cramping additionally. Says blood sugar is uncontrolled because of this   Other Other (See Comments)    ALL DIABETIC MEDS PER PT    Pioglitazone Other (See Comments)    ALL DIABETIC MEDS PER PT = peripheral edema   Pravastatin Other (See Comments)    Retained fluid   Sitagliptin Other (See Comments)    ALL DIABETIC MEDS PER PT  Other reaction(s): headache   Spironolactone-Hctz Other (See Comments)   Statins Other (See Comments)    Myalgia   Sulfamethoxazole Other (See Comments)    ALL DIABETIC MEDS PER PT    Tramadol  Hcl Itching   Atorvastatin Calcium  Other (See Comments)     retain fld   Augmentin  [Amoxicillin -Pot Clavulanate] Other (See Comments)    Caused jaundice   Crestor [Rosuvastatin] Other (See Comments)   Rosuvastatin Calcium     Torsemide  Nausea Only    Gi  upset, chills, abd pain,bloating   Bumex  [Bumetanide ] Rash and Other (See Comments)    Chills, rash, headaches, stomach issues also   Lasix  [Furosemide ] Rash and Other (See Comments)    Sugars increased   Sulfa Antibiotics Rash and Other (See Comments)    Mother has told patient in the past not to take, she cannot recall there reaction   ROS neg/noncontributory except as noted HPI/below      Objective:     BP (!) 140/68 (BP Location: Left Arm, Patient Position: Sitting, Cuff Size: Normal)   Pulse 84   Temp 98.3 F (36.8 C) (Temporal)   Resp 18   Ht 5' 2 (1.575 m)   Wt 189 lb 4 oz (85.8 kg)   SpO2 97%   BMI 34.61 kg/m  Wt Readings from Last 3 Encounters:  07/15/24 189 lb 4 oz (85.8 kg)  07/14/24 190 lb (86.2 kg)  06/12/24 189 lb (85.7 kg)    Physical Exam   Gen: WDWN NAD HEENT: NCAT, conjunctiva not injected, sclera nonicteric NECK:  supple, no thyromegaly, no nodes, no carotid bruits CARDIAC: RRR, S1S2+, no murmur. DP 1+B LUNGS: CTAB. No wheezes ABDOMEN:  BS+, soft, NTND, No HSM, no masses EXT:  no edema MSK: no gross abnormalities.  NEURO: A&O x3.  CN II-XII intact.  PSYCH: normal mood. Good eye contact Diabetic Foot Exam - Simple   Simple Foot Form Diabetic Foot exam was performed with the following findings: Yes 07/15/2024  2:48 PM  Visual Inspection No deformities, no ulcerations, no other skin breakdown bilaterally: Yes Sensation Testing Intact to touch and monofilament testing bilaterally: Yes Pulse Check Posterior Tibialis and Dorsalis pulse intact bilaterally: Yes Comments        Assessment & Plan:  Type 2 diabetes mellitus with stage 3a chronic kidney disease, without long-term current use of insulin  (HCC)  B12 deficiency  Local edema  Statin myopathy  Assessment and Plan Assessment & Plan Chronic kidney disease   Chronic kidney disease requires careful potassium management due to diuretic use, with a risk of hyperkalemia.  She takes  potassium twice daily, even without diuretics, necessitating regular monitoring of potassium levels to ensure appropriate intake.  Type 2 diabetes mellitus   Type 2 diabetes mellitus management focuses on blood glucose control and dietary management. Her recent A1c was not discussed. She understands the link between her diabetes and pancreatic insufficiency. Fasting blood work, including A1c, will be performed at the next visit, and dietary management will be discussed.  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 She is actively learning about the condition and its connection to diabetes, showing a proactive approach to her health.  Edema   Edema is accompanied by fluctuating weight, currently at 189 lbs, previously reduced to 179 lbs with consistent diuretic use. Side effects from diuretics affect her adherence. Encourage consistent diuretic use as tolerated and monitor weight regularly.  Hyperlipidemia   Hyperlipidemia is managed with Repatha . Her last LDL was 34 in December, within the target range, but triglycerides were slightly elevated at 195, likely due to dietary factors. She is due for a lipid panel at the next visit, and dietary modifications to reduce triglycerides will be discussed.  Vitamin B12 deficiency on replacement therapy   Vitamin B12 deficiency is managed with regular B12 injections, with a recent shot administered.  Chest pain   Intermittent chest pain over the past 4-5 months occurs randomly without specific triggers. The differential includes anxiety and cardiac causes, with no associated symptoms like dizziness, nausea, or sweating. She needs to establish care with a new cardiologist and will contact cardiology for this. An earlier cardiology evaluation will be considered if chest pain worsens.    Return in about 4 weeks (around 08/12/2024) for chronic follow-up.  Jenkins CHRISTELLA Carrel, MD

## 2024-07-15 NOTE — Patient Instructions (Addendum)
 It was very nice to see you today!  Fasting blood work next time. And drink water  so can pee   PLEASE NOTE:  If you had any lab tests please let us  know if you have not heard back within a few days. You may see your results on MyChart before we have a chance to review them but we will give you a call once they are reviewed by us . If we ordered any referrals today, please let us  know if you have not heard from their office within the next week.   Please try these tips to maintain a healthy lifestyle:  Eat most of your calories during the day when you are active. Eliminate processed foods including packaged sweets (pies, cakes, cookies), reduce intake of potatoes, white bread, white pasta, and white rice. Look for whole grain options, oat flour or almond flour.  Each meal should contain half fruits/vegetables, one quarter protein, and one quarter carbs (no bigger than a computer mouse).  Cut down on sweet beverages. This includes juice, soda, and sweet tea. Also watch fruit intake, though this is a healthier sweet option, it still contains natural sugar! Limit to 3 servings daily.  Drink at least 1 glass of water  with each meal and aim for at least 8 glasses per day  Exercise at least 150 minutes every week.

## 2024-07-16 NOTE — Progress Notes (Signed)
 1. Failure to attend appointment with reason given    Appointment canceled by patient.

## 2024-07-20 ENCOUNTER — Telehealth: Payer: Self-pay | Admitting: Gastroenterology

## 2024-07-20 NOTE — Telephone Encounter (Signed)
 Inbound call from patient stated she was last seen in office 07/14/2024 and at the end of visit was given office notes that were not hers. Patient states it belonged to a [employer]. Requesting a call back in regards to her office notes   Please advise  Thank you

## 2024-07-20 NOTE — Telephone Encounter (Signed)
 I spoke with Ms Riggle on the phone, A Form for her to sign, self addressed envelope and correct AVS has been placed in the envelope. Patient will sign saying she has destroyed the document and return to us .  Apologized to the patient for any inconvenience . She was fine and will return form.

## 2024-07-22 ENCOUNTER — Encounter: Payer: Self-pay | Admitting: Podiatry

## 2024-07-22 ENCOUNTER — Ambulatory Visit (INDEPENDENT_AMBULATORY_CARE_PROVIDER_SITE_OTHER): Admitting: Podiatry

## 2024-07-22 DIAGNOSIS — M79675 Pain in left toe(s): Secondary | ICD-10-CM | POA: Diagnosis not present

## 2024-07-22 DIAGNOSIS — B351 Tinea unguium: Secondary | ICD-10-CM | POA: Diagnosis not present

## 2024-07-22 DIAGNOSIS — M79674 Pain in right toe(s): Secondary | ICD-10-CM

## 2024-07-22 DIAGNOSIS — E1122 Type 2 diabetes mellitus with diabetic chronic kidney disease: Secondary | ICD-10-CM

## 2024-07-22 DIAGNOSIS — N1831 Chronic kidney disease, stage 3a: Secondary | ICD-10-CM | POA: Diagnosis not present

## 2024-07-22 NOTE — Progress Notes (Signed)
.  This patient returns to my office for at risk foot care.  This patient requires this care by a professional since this patient will be at risk due to having diabetes.  This patient is unable to cut nails herself since the patient cannot reach her nails.These nails are painful walking and wearing shoes.  This patient presents for at risk foot care today.  General Appearance  Alert, conversant and in no acute stress.  Vascular  Dorsalis pedis and posterior tibial  pulses are palpable  bilaterally.  Capillary return is within normal limits  bilaterally. Temperature is within normal limits  bilaterally.  Neurologic  Senn-Weinstein monofilament wire test within normal limits  bilaterally. Muscle power within normal limits bilaterally.  Nails Thick disfigured discolored nails with subungual debris  from hallux to fifth toes bilaterally. No evidence of bacterial infection or drainage bilaterally.  Orthopedic  No limitations of motion  feet .  No crepitus or effusions noted.  No bony pathology or digital deformities noted.  Skin  normotropic skin with no porokeratosis noted bilaterally.  No signs of infections or ulcers noted.     Onychomycosis  Pain in right toes  Pain in left toes  Consent was obtained for treatment procedures.   Mechanical debridement of nails 1-5  bilaterally performed with a nail nipper.  Filed with dremel without incident.    Return office visit   3 months with Dr.  May.                  Told patient to return for periodic foot care and evaluation due to potential at risk complications.   Cordella Bold DPM

## 2024-07-29 ENCOUNTER — Ambulatory Visit (INDEPENDENT_AMBULATORY_CARE_PROVIDER_SITE_OTHER): Payer: 59 | Admitting: Physician Assistant

## 2024-07-29 ENCOUNTER — Ambulatory Visit (HOSPITAL_COMMUNITY)
Admission: RE | Admit: 2024-07-29 | Discharge: 2024-07-29 | Disposition: A | Discharge status: Home / Self Care | Payer: 59 | Source: Ambulatory Visit | Attending: Vascular Surgery | Admitting: Vascular Surgery

## 2024-07-29 VITALS — BP 158/80 | HR 93 | Temp 97.7°F | Wt 193.2 lb

## 2024-07-29 DIAGNOSIS — I6523 Occlusion and stenosis of bilateral carotid arteries: Secondary | ICD-10-CM | POA: Insufficient documentation

## 2024-07-29 NOTE — Progress Notes (Signed)
 Office Note   History of Present Illness   [name] is a 73 y.o. (1951-01-22) female who presents for surveillance of carotid artery stenosis.  She has no prior history of CVA or TIA.  Her last duplex demonstrated 60 to 79% right ICA stenosis and 1 to 39% left ICA stenosis.  She returns today for follow-up.  She says that she is feeling okay overall, however her memory continues to get worse.  She denies any strokelike symptoms such as slurred speech, facial droop, sudden visual changes, or sudden weakness/numbness.  She does have chronic worsening vision in her left eye due to macular degeneration.  She is also wondering if we can refer her back to her previous cardiologist office so she can have her routine follow-up.  Current Outpatient Medications  Medication Sig Dispense Refill   aspirin  EC 81 MG tablet Take 81 mg by mouth daily. Swallow whole.     Cholecalciferol  (VITAMIN D3) 25 MCG (1000 UT) capsule Take 1,000 Units by mouth daily.     estradiol  (ESTRACE ) 0.1 MG/GM vaginal cream Place 0.5 g vaginally 2 (two) times a week. Place 1g twice a week 42.5 g 11   ESTRING  7.5 MCG/24HR vaginal ring Place 1 each vaginally every 3 (three) months.     Evolocumab  (REPATHA  SURECLICK) 140 MG/ML SOAJ INJECT 1 PEN SUBCUTANEOUSLY EVERY 14 DAYS 6 mL 3   glucose blood (ACCU-CHEK GUIDE) test strip Use to test blood sugar 3 times daily 100 each 2   levothyroxine  (SYNTHROID ) 50 MCG tablet TAKE 1 TABLET BY MOUTH EVERY DAY BEFORE BREAKFAST 90 tablet 3   Multiple Vitamin (MULTIVITAMIN WITH MINERALS) TABS tablet Take 1 tablet by mouth daily. 30 tablet 0   potassium chloride  SA (KLOR-CON  M) 20 MEQ tablet TAKE 1 TABLET BY MOUTH 2 TIMES DAILY 180 tablet 1   torsemide  (DEMADEX ) 20 MG tablet TAKE 1 TABLET BY MOUTH ONCE DAILY 90 tablet 1   No current facility-administered medications for this visit.    REVIEW OF SYSTEMS (negative unless checked):   Cardiac:  []  Chest pain or chest pressure? []   Shortness of breath upon activity? []  Shortness of breath when lying flat? []  Irregular heart rhythm?  Vascular:  []  Pain in calf, thigh, or hip brought on by walking? []  Pain in feet at night that wakes you up from your sleep? []  Blood clot in your veins? []  Leg swelling?  Pulmonary:  []  Oxygen at home? []  Productive cough? []  Wheezing?  Neurologic:  []  Sudden weakness in arms or legs? []  Sudden numbness in arms or legs? []  Sudden onset of difficult speaking or slurred speech? []  Temporary loss of vision in one eye? []  Problems with dizziness?  Gastrointestinal:  []  Blood in stool? []  Vomited blood?  Genitourinary:  []  Burning when urinating? []  Blood in urine?  Psychiatric:  []  Major depression  Hematologic:  []  Bleeding problems? []  Problems with blood clotting?  Dermatologic:  []  Rashes or ulcers?  Constitutional:  []  Fever or chills?  Ear/Nose/Throat:  []  Change in hearing? []  Nose bleeds? []  Sore throat?  Musculoskeletal:  []  Back pain? []  Joint pain? []  Muscle pain?   Physical Examination   Vitals:   07/29/24 1211 07/29/24 1214  BP: (!) 172/78 (!) 158/80  Pulse: 93   Temp: 97.7 F (36.5 C)   TempSrc: Temporal   Weight: 193 lb 3.2 oz (87.6 kg)    Body mass index is 35.34 kg/m.  General:  WDWN in NAD; vital  signs documented above Gait: Not observed HENT: WNL, normocephalic Pulmonary: normal non-labored breathing , without rales, rhonchi,  wheezing Cardiac: regular Abdomen: soft, NT, no masses Skin: without rashes Vascular Exam/Pulses: palpable radial pulses bilaterally Extremities: without ischemic changes, without gangrene , without cellulitis; without open wounds;  Musculoskeletal: no muscle wasting or atrophy  Neurologic: A&O X 3;  No focal weakness or paresthesias are detected Psychiatric:  The pt has Normal affect.  Non-Invasive Vascular Imaging   Bilateral Carotid Duplex (07/29/2024):  R ICA stenosis:  60-79% R VA:  patent  and antegrade L ICA stenosis:  1-39% L VA:  patent and antegrade   Medical Decision Making   MANNAT BENEDETTI is a 73 y.o. female who presents for surveillance of carotid artery stenosis  Based on the patient's vascular studies, her carotid stenosis is unchanged bilaterally.  She has right ICA 60 to 79% stenosis and left ICA 1 to 39% stenosis She denies any strokelike symptoms such as slurred speech, facial droop, sudden visual changes, or sudden weakness/numbness. On exam she has palpable and equal radial pulses bilaterally.  She is neurologically intact. She will continue her current medications and follow-up with our office in 6 months with repeat carotid duplex.  We will also make sure the patient is referred back to her previous cardiologist office for routine follow-up care   [facility] PA-C [facility] Office: [phone]  Clinic MD: Sheree

## 2024-07-31 ENCOUNTER — Other Ambulatory Visit: Payer: Self-pay

## 2024-07-31 DIAGNOSIS — I6523 Occlusion and stenosis of bilateral carotid arteries: Secondary | ICD-10-CM

## 2024-08-08 DIAGNOSIS — R051 Acute cough: Secondary | ICD-10-CM | POA: Diagnosis not present

## 2024-08-11 DIAGNOSIS — N050 Unspecified nephritic syndrome with minor glomerular abnormality: Secondary | ICD-10-CM | POA: Diagnosis not present

## 2024-08-11 DIAGNOSIS — N1832 Chronic kidney disease, stage 3b: Secondary | ICD-10-CM | POA: Diagnosis not present

## 2024-08-11 DIAGNOSIS — E1122 Type 2 diabetes mellitus with diabetic chronic kidney disease: Secondary | ICD-10-CM | POA: Diagnosis not present

## 2024-08-11 DIAGNOSIS — I129 Hypertensive chronic kidney disease with stage 1 through stage 4 chronic kidney disease, or unspecified chronic kidney disease: Secondary | ICD-10-CM | POA: Diagnosis not present

## 2024-08-11 DIAGNOSIS — N2581 Secondary hyperparathyroidism of renal origin: Secondary | ICD-10-CM | POA: Diagnosis not present

## 2024-08-11 DIAGNOSIS — R609 Edema, unspecified: Secondary | ICD-10-CM | POA: Diagnosis not present

## 2024-08-11 DIAGNOSIS — R300 Dysuria: Secondary | ICD-10-CM | POA: Diagnosis not present

## 2024-08-11 DIAGNOSIS — D631 Anemia in chronic kidney disease: Secondary | ICD-10-CM | POA: Diagnosis not present

## 2024-08-12 ENCOUNTER — Ambulatory Visit: Admitting: Podiatry

## 2024-08-12 LAB — LAB REPORT - SCANNED
Creatinine, POC: 135.9 mg/dL
EGFR: 38

## 2024-08-13 ENCOUNTER — Encounter: Payer: Self-pay | Admitting: Family Medicine

## 2024-08-13 ENCOUNTER — Ambulatory Visit: Admitting: Family Medicine

## 2024-08-13 ENCOUNTER — Ambulatory Visit (INDEPENDENT_AMBULATORY_CARE_PROVIDER_SITE_OTHER)

## 2024-08-13 ENCOUNTER — Ambulatory Visit: Payer: Self-pay | Admitting: Family Medicine

## 2024-08-13 VITALS — BP 140/70 | HR 78 | Temp 97.5°F | Resp 18 | Ht 62.0 in | Wt 187.0 lb

## 2024-08-13 DIAGNOSIS — N289 Disorder of kidney and ureter, unspecified: Secondary | ICD-10-CM

## 2024-08-13 DIAGNOSIS — E538 Deficiency of other specified B group vitamins: Secondary | ICD-10-CM

## 2024-08-13 DIAGNOSIS — N1831 Chronic kidney disease, stage 3a: Secondary | ICD-10-CM

## 2024-08-13 DIAGNOSIS — E1122 Type 2 diabetes mellitus with diabetic chronic kidney disease: Secondary | ICD-10-CM

## 2024-08-13 DIAGNOSIS — I771 Stricture of artery: Secondary | ICD-10-CM | POA: Diagnosis not present

## 2024-08-13 DIAGNOSIS — Z889 Allergy status to unspecified drugs, medicaments and biological substances status: Secondary | ICD-10-CM

## 2024-08-13 DIAGNOSIS — R0989 Other specified symptoms and signs involving the circulatory and respiratory systems: Secondary | ICD-10-CM | POA: Diagnosis not present

## 2024-08-13 DIAGNOSIS — I509 Heart failure, unspecified: Secondary | ICD-10-CM | POA: Diagnosis not present

## 2024-08-13 DIAGNOSIS — R600 Localized edema: Secondary | ICD-10-CM

## 2024-08-13 MED ORDER — CYANOCOBALAMIN 1000 MCG/ML IJ SOLN
1000.0000 ug | Freq: Once | INTRAMUSCULAR | Status: AC
  Administered 2024-08-13: 1000 ug via INTRAMUSCULAR

## 2024-08-13 NOTE — Progress Notes (Signed)
 Subjective:     Patient ID: [name], female    DOB: 11-26-1951, 73 y.o.   MRN: [id number]  Chief Complaint  Patient presents with   Medical Management of Chronic Issues    4 week follow-up     HPI Ckd-Dr. Singh-allergist Discussed the use of AI scribe software for clinical note transcription with the patient, who gave verbal consent to proceed.  History of Present Illness [name] is a 73 year old female who presents for evaluation of her medication management and ongoing symptoms.  She experiences significant frustration with her current medical care, particularly regarding communication issues with her healthcare providers. She has been unable to receive timely calls or texts regarding her medical appointments and test results, contributing to her distress.  She has a history of multiple drug allergies and sensitivities, complicating her treatment for diabetes, HTN, and other conditions. She is currently taking doxycycline 100 mg for two days due to a recent respiratory issue, following a visit to a clinic where a provider noted abnormal lung sounds. She has a mild cough and some shortness of breath. She is also taking torsemide  every three days for fluid management(per neph), resulting in some weight loss.  She has a history of urinary tract infections (UTIs) and mentions a recent episode where she was informed by a pharmacy about a prescription for a UTI that she was not aware of. She suspects she may have another UTI, as she experiences burning and a sticky feeling, similar to previous episodes. She is concerned about the possibility of the UTIs being related to her prolapsed vagina and potential kidney involvement.  She expresses frustration with the lack of communication from her kidney specialist and mentions a previous kidney biopsy in the 1990s. Recent blood work was done, but she has not received results and was told it could take weeks. She is also concerned  about her potassium levels, as she takes two potassium supplements daily.  She is experiencing severe chills and increased shortness of breath. She notes that she has lost weight recently, which she associates with her fluid management regimen.  She also mentions a referral to an ear, nose, and throat specialist due to hearing loss and the absence of an eardrum in one ear. She questions the possibility of fluid retention in the ear without an eardrum.    Health Maintenance Due  Topic Date Due   OPHTHALMOLOGY EXAM  Never done   Diabetic kidney evaluation - Urine ACR  01/24/2024   INFLUENZA VACCINE  07/17/2024   HEMOGLOBIN A1C  07/19/2024    Past Medical History:  Diagnosis Date   Allergy    Anemia    Arthritis    Asthma    Dianosed years ago-no meds at this time   Cancer [facility]) 2021   endometrial    Cataract    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 name of MD    Dementia (HCC)    Diabetes mellitus    type 2    Diverticulosis    Dyslipidemia    Dyspnea    with exertion    Dysrhythmia    skips beats   Endometrial cancer (HCC) 2021   Family history of adverse reaction to anesthesia    son stopped breathing during surgery , mother slow to wake up  Fibromyalgia 1980's   Generalized anxiety disorder    GERD (gastroesophageal reflux disease)    occ- will use Tums and Prolosec  prn    Heart murmur    Hyperlipemia    Hypertension    not on blood pressure meds due to allergies - last dose of meds- months ago    Hypothyroidism    Hypothyroidism (acquired)    Internal hemorrhoids    Irritable bowel syndrome 1980's   Liver cancer (HCC)    Major depressive disorder    Migraine headaches 1980's   On meds-well controlled   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   Morbid obesity (HCC)    Pernicious anemia    Pneumonia 2009   Several times  over the past several years   PONV (postoperative nausea and vomiting)    was told by neurologist due to calcificaton in brain not to be put to sleep, N/V during Op and Recovery    Recurrent upper respiratory infection (URI)    Sleep apnea    no cpap   Subarachnoid hemorrhage (HCC) 2010   Vaginal polyp    benign per pt    Past Surgical History:  Procedure Laterality Date   BIOPSY  01/07/2024   Procedure: BIOPSY;  Surgeon: Shila Gustav GAILS, MD;  Location: [facility];  Service: Gastroenterology;;   CATARACT EXTRACTION Right    CHOLECYSTECTOMY  1983   COLONOSCOPY  2008   DB   COLONOSCOPY WITH PROPOFOL  N/A 01/07/2024   Procedure: COLONOSCOPY WITH PROPOFOL ;  Surgeon: Shila Gustav GAILS, MD;  Location: [facility];  Service: Gastroenterology;  Laterality: N/A;   DILATION AND CURETTAGE OF UTERUS  1972   ESOPHAGOGASTRODUODENOSCOPY (EGD) WITH PROPOFOL  N/A 01/07/2024   Procedure: ESOPHAGOGASTRODUODENOSCOPY (EGD) WITH PROPOFOL ;  Surgeon: Shila Gustav GAILS, MD;  Location: [facility];  Service: Gastroenterology;  Laterality: N/A;   HYSTEROSCOPY WITH D & C  06/29/2011   Procedure: DILATATION AND CURETTAGE (D&C) /HYSTEROSCOPY;  Surgeon: Glenys GORMAN Birk, MD;  Location: [facility];  Service: Gynecology;  Laterality: N/A;   ROBOTIC ASSISTED TOTAL HYSTERECTOMY WITH BILATERAL SALPINGO OOPHERECTOMY N/A 10/27/2020   Procedure: XI ROBOTIC ASSISTED TOTAL HYSTERECTOMY WITH BILATERAL SALPINGO OOPHORECTOMY;  Surgeon: Eloy Herring, MD;  Location: [facility];  Service: Gynecology;  Laterality: N/A;   SENTINEL NODE BIOPSY N/A 10/27/2020   Procedure: SENTINEL NODE BIOPSY;  Surgeon: Eloy Herring, MD;  Location: [facility];  Service: Gynecology;  Laterality: N/A;     Current Outpatient Medications:    aspirin  EC 81 MG tablet, Take 81 mg by mouth daily. Swallow whole., Disp: , Rfl:    Cholecalciferol  (VITAMIN D3) 25 MCG (1000 UT) capsule, Take 1,000 Units by mouth daily., Disp: , Rfl:    doxycycline (VIBRA-TABS) 100 MG  tablet, Take 100 mg by mouth 2 (two) times daily., Disp: , Rfl:    estradiol  (ESTRACE ) 0.1 MG/GM vaginal cream, Place 0.5 g vaginally 2 (two) times a week. Place 1g twice a week, Disp: 42.5 g, Rfl: 11   ESTRING  7.5 MCG/24HR vaginal ring, Place 1 each vaginally every 3 (three) months., Disp: , Rfl:    Evolocumab  (REPATHA  SURECLICK) 140 MG/ML SOAJ, INJECT 1 PEN SUBCUTANEOUSLY EVERY 14 DAYS, Disp: 6 mL, Rfl: 3   glucose blood (ACCU-CHEK GUIDE) test strip, Use to test blood sugar 3 times daily, Disp: 100 each, Rfl: 2   levothyroxine  (SYNTHROID ) 50 MCG tablet, TAKE 1 TABLET BY MOUTH EVERY DAY BEFORE BREAKFAST, Disp: 90 tablet, Rfl: 3   Multiple Vitamin (MULTIVITAMIN WITH MINERALS) TABS tablet, Take  1 tablet by mouth daily., Disp: 30 tablet, Rfl: 0   potassium chloride  SA (KLOR-CON  M) 20 MEQ tablet, TAKE 1 TABLET BY MOUTH 2 TIMES DAILY, Disp: 180 tablet, Rfl: 1   torsemide  (DEMADEX ) 20 MG tablet, TAKE 1 TABLET BY MOUTH ONCE DAILY, Disp: 90 tablet, Rfl: 1  Allergies  Allergen Reactions   Amoxicillin -Pot Clavulanate Other (See Comments)    Caused jaundice  Jaundice   Atenolol Other (See Comments)    Stomach problems, chills   Atorvastatin Other (See Comments)    Retained fluid   Canagliflozin Other (See Comments)    ALL DIABETIC MEDS PER PT   abdominal pain, headache   Losartan Potassium Other (See Comments)    chills, HA, stomach pain   Acarbose Nausea Only and Other (See Comments)     headache,nausea   Cinnamon Other (See Comments)    Stomach pain   Empagliflozin Other (See Comments)     headaches   Glimepiride Other (See Comments)    ALL DIABETIC MEDS PER PT  abdominal pain, headache   Metformin And Related Nausea And Vomiting and Other (See Comments)    Patient states that she has severe chills, headache and cramping additionally. Says blood sugar is uncontrolled because of this   Other Other (See Comments)    ALL DIABETIC MEDS PER PT    Pioglitazone Other (See Comments)    ALL  DIABETIC MEDS PER PT = peripheral edema   Pravastatin Other (See Comments)    Retained fluid   Sitagliptin Other (See Comments)    ALL DIABETIC MEDS PER PT  Other reaction(s): headache   Spironolactone-Hctz Other (See Comments)   Statins Other (See Comments)    Myalgia   Sulfamethoxazole Other (See Comments)    ALL DIABETIC MEDS PER PT    Tramadol  Hcl Itching   Atorvastatin Calcium  Other (See Comments)     retain fld   Crestor [Rosuvastatin] Other (See Comments)   Rosuvastatin Calcium     Torsemide  Nausea Only    Gi upset, chills, abd pain,bloating   Bumex  [Bumetanide ] Rash and Other (See Comments)    Chills, rash, headaches, stomach issues also   Lasix  [Furosemide ] Rash and Other (See Comments)    Sugars increased   Sulfa Antibiotics Rash and Other (See Comments)    Mother has told patient in the past not to take, she cannot recall there reaction   ROS neg/noncontributory except as noted HPI/below      Objective:     BP (!) 140/70 (BP Location: Left Arm, Cuff Size: Normal)   Pulse 78   Temp (!) 97.5 F (36.4 C) (Temporal)   Resp 18   Ht 5' 2 (1.575 m)   Wt 187 lb (84.8 kg)   SpO2 98%   BMI 34.20 kg/m  Wt Readings from Last 3 Encounters:  08/13/24 187 lb (84.8 kg)  07/29/24 193 lb 3.2 oz (87.6 kg)  07/15/24 189 lb 4 oz (85.8 kg)    Physical Exam   Gen: WDWN NAD HEENT: NCAT, conjunctiva not injected, sclera nonicteric NECK:  supple, no thyromegaly, no nodes, no carotid bruits CARDIAC: RRR, S1S2+, no murmur. DP 1+B LUNGS: . No wheezes  +bibasalar crackles ABDOMEN:  BS+, soft, NTND, No HSM, no masses EXT:  no edema. A lot of spider veins MSK: no gross abnormalities.  NEURO: A&O x3.  CN II-XII intact.  PSYCH: normal mood. 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 Refer to an allergist for evaluation.  Type 2 diabetes mellitus with chronic kidney disease   Chronic kidney disease complicates her diabetes management. Recent blood work by the nephrologist is pending,  Attempt to obtain recent lab results from the nephrologist.  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 She experiences burning and a sticky feeling, suggestive of a UTI. Recent urine test by the nephrologist is pending, with concerns about potential kidney involvement and the need for a possible kidney biopsy. Pt waiting on results from neph  Edema/fluid retention with shortness of breath   Fluid retention contributes to her shortness of breath. She takes torsemide  every three days, resulting in some weight loss. There are concerns about potassium levels due to diuretic use, but recent labs are pending. Increased shortness of breath may be due to fluid retention, URI(exposed), other. Continue torsemide  every three days and attempt to obtain recent lab results from the nephrologist to assess potassium levels.  Cough   Her cough may be related to recent COVID-19 exposure and possible fluid retention in the lungs. She is currently on doxycycline for a possible respiratory infection after COVID-19 exposure. Continue doxycycline as prescribed. Check CXR  Hearing loss, left ear with absent eardrum   She has hearing loss in the left ear with an absent eardrum and concerns about fluid retention despite the absence of an eardrum. UD did Refer to an ENT specialist for evaluation of hearing loss and fluid retention in the left ear.    Return in about 4  weeks (around 09/10/2024) for chronic follow-up.  Jenkins CHRISTELLA Carrel, MD

## 2024-08-13 NOTE — Progress Notes (Signed)
 There is some fluid on the lungs.  Needs to take the torsemide  at least every 3 days.

## 2024-08-13 NOTE — Patient Instructions (Signed)
 It was very nice to see you today!  Referral sent fo allergist   PLEASE NOTE:  If you had any lab tests please let us  know if you have not heard back within a few days. You may see your results on MyChart before we have a chance to review them but we will give you a call once they are reviewed by us . If we ordered any referrals today, please let us  know if you have not heard from their office within the next week.   Please try these tips to maintain a healthy lifestyle:  Eat most of your calories during the day when you are active. Eliminate processed foods including packaged sweets (pies, cakes, cookies), reduce intake of potatoes, white bread, white pasta, and white rice. Look for whole grain options, oat flour or almond flour.  Each meal should contain half fruits/vegetables, one quarter protein, and one quarter carbs (no bigger than a computer mouse).  Cut down on sweet beverages. This includes juice, soda, and sweet tea. Also watch fruit intake, though this is a healthier sweet option, it still contains natural sugar! Limit to 3 servings daily.  Drink at least 1 glass of water  with each meal and aim for at least 8 glasses per day  Exercise at least 150 minutes every week.

## 2024-08-18 ENCOUNTER — Telehealth: Payer: Self-pay | Admitting: *Deleted

## 2024-08-18 NOTE — Telephone Encounter (Signed)
 Copied from CRM #[id number]. Topic: Clinical - Lab/Test Results >> Aug 18, 2024 10:52 AM Franky GRADE wrote: Reason for CRM: Patient is calling to see if Dr.Kulik reviewed most recent lab results she had done at [facility] by Dr.Singh and wanted to review them with her if possible.

## 2024-08-18 NOTE — Telephone Encounter (Signed)
 Left message to return my call.

## 2024-08-19 ENCOUNTER — Ambulatory Visit: Admitting: Obstetrics and Gynecology

## 2024-08-19 ENCOUNTER — Encounter: Payer: Self-pay | Admitting: Obstetrics and Gynecology

## 2024-08-19 ENCOUNTER — Other Ambulatory Visit (HOSPITAL_COMMUNITY)
Admission: RE | Admit: 2024-08-19 | Discharge: 2024-08-19 | Disposition: A | Discharge status: Home / Self Care | Source: Other Acute Inpatient Hospital | Attending: Obstetrics and Gynecology | Admitting: Obstetrics and Gynecology

## 2024-08-19 VITALS — BP 146/74 | HR 80

## 2024-08-19 DIAGNOSIS — R82998 Other abnormal findings in urine: Secondary | ICD-10-CM | POA: Diagnosis present

## 2024-08-19 DIAGNOSIS — N812 Incomplete uterovaginal prolapse: Secondary | ICD-10-CM

## 2024-08-19 DIAGNOSIS — N952 Postmenopausal atrophic vaginitis: Secondary | ICD-10-CM

## 2024-08-19 DIAGNOSIS — R350 Frequency of micturition: Secondary | ICD-10-CM

## 2024-08-19 DIAGNOSIS — Z960 Presence of urogenital implants: Secondary | ICD-10-CM

## 2024-08-19 LAB — URINALYSIS, ROUTINE W REFLEX MICROSCOPIC
Bilirubin Urine: NEGATIVE
Glucose, UA: NEGATIVE mg/dL
Hgb urine dipstick: NEGATIVE
Ketones, ur: NEGATIVE mg/dL
Leukocytes,Ua: NEGATIVE
Nitrite: NEGATIVE
Protein, ur: 100 mg/dL — AB
Specific Gravity, Urine: 1.017 (ref 1.005–1.030)
pH: 5 (ref 5.0–8.0)

## 2024-08-19 LAB — POCT URINALYSIS DIP (CLINITEK)
Bilirubin, UA: NEGATIVE
Glucose, UA: NEGATIVE mg/dL
Ketones, POC UA: NEGATIVE mg/dL
Leukocytes, UA: NEGATIVE
Nitrite, UA: NEGATIVE
POC PROTEIN,UA: 100 — AB
Spec Grav, UA: 1.015 (ref 1.010–1.025)
Urobilinogen, UA: 0.2 U/dL
pH, UA: 5.5 (ref 5.0–8.0)

## 2024-08-19 MED ORDER — ESTRADIOL 0.1 MG/GM VA CREA: 0.5000 g | TOPICAL_CREAM | VAGINAL | 11 refills | Status: DC

## 2024-08-19 NOTE — Telephone Encounter (Signed)
 Patient notified of message below and verbalized understanding. Patient stated she is having water -downed bleeding that started 1 week ago, chills, has vomited twice. Patient scheduled with urogyn in October, will try to see if she can call and get an earlier appointment.

## 2024-08-19 NOTE — Progress Notes (Signed)
 [facility]   Subjective:     Chief Complaint:  Chief Complaint  Patient presents with   Pessary Check    RAELYNNE LUDWICK is a 73 y.o. female is here for pessary check.   History of Present Illness: SHAQUETTA ARCOS is a 73 y.o. female with stage II pelvic organ prolapse who presents for a pessary check. She is using a size #4 short stem gellhorn pessary. The pessary has been working well and she has no complaints. She is not using vaginal estrogen. She denies endorses bleeding.  Past Medical History: Patient  has a past medical history of Allergy, Anemia, Arthritis, Asthma, Cancer (HCC) (2021), Cataract, Chronic kidney disease (1997), Dementia (HCC), Diabetes mellitus, Diverticulosis, Dyslipidemia, Dyspnea, Dysrhythmia, Endometrial cancer (HCC) (2021), Family history of adverse reaction to anesthesia, Fibromyalgia (1980's), Generalized anxiety disorder, GERD (gastroesophageal reflux disease), Heart murmur, Hyperlipemia, Hypertension, Hypothyroidism, Hypothyroidism (acquired), Internal hemorrhoids, Irritable bowel syndrome (1980's), Liver cancer (HCC), Major depressive disorder, Migraine headaches (1980's), Mild neurocognitive disorder (10/05/2019), Morbid obesity (HCC), Pernicious anemia, Pneumonia (2009), PONV (postoperative nausea and vomiting), Recurrent upper respiratory infection (URI), Sleep apnea, Subarachnoid hemorrhage (HCC) (2010), and Vaginal polyp.   Past Surgical History: She  has a past surgical history that includes Dilation and curettage of uterus (1972); Cholecystectomy (1983); Hysteroscopy with D & C (06/29/2011); Colonoscopy (2008); Robotic assisted total hysterectomy with bilateral salpingo oophorectomy (N/A, 10/27/2020); Sentinel node biopsy (N/A, 10/27/2020); Cataract extraction (Right); Colonoscopy with propofol  (N/A, 01/07/2024); Esophagogastroduodenoscopy (egd) with propofol  (N/A, 01/07/2024); and biopsy (01/07/2024).   Medications: She has a current  medication list which includes the following prescription(s): aspirin  ec, vitamin d3, doxycycline, [START ON 08/20/2024] estradiol , repatha  sureclick, accu-chek guide, levothyroxine , multivitamin with minerals, potassium chloride  sa, and torsemide .   Allergies: Patient is allergic to amoxicillin -pot clavulanate, atenolol, atorvastatin, canagliflozin, losartan potassium, acarbose, cinnamon, empagliflozin, glimepiride, metformin and related, other, pioglitazone, pravastatin, sitagliptin, spironolactone-hctz, statins, sulfamethoxazole, tramadol  hcl, atorvastatin calcium , crestor [rosuvastatin], rosuvastatin calcium , torsemide , bumex  [bumetanide ], lasix  [furosemide ], and sulfa antibiotics.   Social History: Patient  reports that she has never smoked. She has never used smokeless tobacco. She reports that she does not currently use alcohol. She reports that she does not use drugs.      Objective:    Physical Exam: BP (!) 146/74   Pulse 80  Gen: No apparent distress, A&O x 3. Detailed Urogynecologic Evaluation:  Pelvic Exam: Normal external female genitalia; Bartholin's and Skene's glands normal in appearance; urethral meatus with caruncle, no urethral masses or discharge. The pessary was noted to be twisted inside the vagina as the stem was facing downward and the apex side was pushing into the left vaginal wall. It was removed and cleaned. Speculum exam revealed erosion in the vagina on the left vaginal wall where the pessary had been pressing. The pessary was left out. Vaginal estrogen cream placed at the vaginal wall where erosion was noted. No active clots or heavy bleeding, just oozing blood from erosion spot.   Laboratory Results: Lab Results  Component Value Date   COLORU yellow 08/19/2024   CLARITYU clear 08/19/2024   GLUCOSEUR negative 08/19/2024   BILIRUBINUR negative 08/19/2024   KETONESU negative 04/06/2024   SPECGRAV 1.015 08/19/2024   RBCUR trace-intact (A) 08/19/2024   PHUR 5.5  08/19/2024   PROTEINUR Positive (A) 04/06/2024   UROBILINOGEN 0.2 08/19/2024   LEUKOCYTESUR Negative 08/19/2024     Assessment/Plan:    Assessment: Ms. Riester is a 73 y.o. with stage II pelvic organ prolapse here for  a pessary check. She is doing well.  Plan: She will use estrogen nightly for 3 weeks and then twice a week after and return for pessary re-insertion.  Urine not concerning for UTI. No need for antibiotic.

## 2024-08-19 NOTE — Patient Instructions (Addendum)
 For the next 3 weeks you will use estrogen cream called ESTRACE  nightly (0.5g, so you will go up to the 0.5 on the tube and insert this inside the vagina).   After 3 weeks, you need to use this twice a week.

## 2024-08-19 NOTE — Telephone Encounter (Signed)
 She isn't sure where it is coming from, possible vagina.

## 2024-08-20 ENCOUNTER — Other Ambulatory Visit: Payer: Self-pay | Admitting: Family

## 2024-08-20 ENCOUNTER — Other Ambulatory Visit: Payer: Self-pay | Admitting: Family Medicine

## 2024-08-20 DIAGNOSIS — R600 Localized edema: Secondary | ICD-10-CM

## 2024-08-20 LAB — URINE CULTURE: Culture: NO GROWTH

## 2024-08-20 MED ORDER — POTASSIUM CHLORIDE CRYS ER 20 MEQ PO TBCR: 20.0000 meq | EXTENDED_RELEASE_TABLET | Freq: Two times a day (BID) | ORAL | 0 refills | Status: DC

## 2024-08-24 ENCOUNTER — Other Ambulatory Visit: Payer: Self-pay

## 2024-08-24 MED ORDER — ESTRADIOL 7.5 MCG/24HR VA RING: 1.0000 | VAGINAL_RING | VAGINAL | 2 refills | Status: DC

## 2024-08-25 DIAGNOSIS — H90A32 Mixed conductive and sensorineural hearing loss, unilateral, left ear with restricted hearing on the contralateral side: Secondary | ICD-10-CM | POA: Diagnosis not present

## 2024-08-25 DIAGNOSIS — H7292 Unspecified perforation of tympanic membrane, left ear: Secondary | ICD-10-CM | POA: Diagnosis not present

## 2024-09-09 ENCOUNTER — Ambulatory Visit (INDEPENDENT_AMBULATORY_CARE_PROVIDER_SITE_OTHER): Admitting: Obstetrics and Gynecology

## 2024-09-09 ENCOUNTER — Other Ambulatory Visit (HOSPITAL_COMMUNITY)
Admission: RE | Admit: 2024-09-09 | Discharge: 2024-09-09 | Disposition: A | Discharge status: Home / Self Care | Source: Ambulatory Visit | Attending: Obstetrics and Gynecology | Admitting: Obstetrics and Gynecology

## 2024-09-09 ENCOUNTER — Encounter: Payer: Self-pay | Admitting: Obstetrics and Gynecology

## 2024-09-09 VITALS — BP 139/74 | HR 89

## 2024-09-09 DIAGNOSIS — N812 Incomplete uterovaginal prolapse: Secondary | ICD-10-CM

## 2024-09-09 DIAGNOSIS — R350 Frequency of micturition: Secondary | ICD-10-CM | POA: Diagnosis not present

## 2024-09-09 DIAGNOSIS — N993 Prolapse of vaginal vault after hysterectomy: Secondary | ICD-10-CM

## 2024-09-09 DIAGNOSIS — R82998 Other abnormal findings in urine: Secondary | ICD-10-CM | POA: Diagnosis not present

## 2024-09-09 DIAGNOSIS — R319 Hematuria, unspecified: Secondary | ICD-10-CM | POA: Diagnosis not present

## 2024-09-09 LAB — URINALYSIS, ROUTINE W REFLEX MICROSCOPIC
Bilirubin Urine: NEGATIVE
Glucose, UA: NEGATIVE mg/dL
Hgb urine dipstick: NEGATIVE
Ketones, ur: NEGATIVE mg/dL
Leukocytes,Ua: NEGATIVE
Nitrite: NEGATIVE
Protein, ur: 100 mg/dL — AB
Specific Gravity, Urine: 1.013 (ref 1.005–1.030)
pH: 6 (ref 5.0–8.0)

## 2024-09-09 LAB — POCT URINALYSIS DIP (CLINITEK)
Bilirubin, UA: NEGATIVE
Glucose, UA: NEGATIVE mg/dL
Ketones, POC UA: NEGATIVE mg/dL
Leukocytes, UA: NEGATIVE
Nitrite, UA: NEGATIVE
POC PROTEIN,UA: 100 — AB
Spec Grav, UA: 1.025 (ref 1.010–1.025)
Urobilinogen, UA: 0.2 U/dL
pH, UA: 6.5 (ref 5.0–8.0)

## 2024-09-09 NOTE — Progress Notes (Signed)
 [facility]   Subjective:     Chief Complaint: Follow-up (Chontel JALAIYA OYSTER is a 73 y.o. female is here for pessary replacement.)  History of Present Illness: CYBELE MAULE is a 73 y.o. female with stage II pelvic organ prolapse who presents today for a pessary fitting.    Past Medical History: Patient  has a past medical history of Allergy, Anemia, Arthritis, Asthma, Cancer (HCC) (2021), Cataract, Chronic kidney disease (1997), Dementia (HCC), Diabetes mellitus, Diverticulosis, Dyslipidemia, Dyspnea, Dysrhythmia, Endometrial cancer (HCC) (2021), Family history of adverse reaction to anesthesia, Fibromyalgia (1980's), Generalized anxiety disorder, GERD (gastroesophageal reflux disease), Heart murmur, Hyperlipemia, Hypertension, Hypothyroidism, Hypothyroidism (acquired), Internal hemorrhoids, Irritable bowel syndrome (1980's), Liver cancer (HCC), Major depressive disorder, Migraine headaches (1980's), Mild neurocognitive disorder (10/05/2019), Morbid obesity (HCC), Pernicious anemia, Pneumonia (2009), PONV (postoperative nausea and vomiting), Recurrent upper respiratory infection (URI), Sleep apnea, Subarachnoid hemorrhage (HCC) (2010), and Vaginal polyp.   Past Surgical History: She  has a past surgical history that includes Dilation and curettage of uterus (1972); Cholecystectomy (1983); Hysteroscopy with D & C (06/29/2011); Colonoscopy (2008); Robotic assisted total hysterectomy with bilateral salpingo oophorectomy (N/A, 10/27/2020); Sentinel node biopsy (N/A, 10/27/2020); Cataract extraction (Right); Colonoscopy with propofol  (N/A, 01/07/2024); Esophagogastroduodenoscopy (egd) with propofol  (N/A, 01/07/2024); and biopsy (01/07/2024).   Medications: She has a current medication list which includes the following prescription(s): aspirin  ec, vitamin d3, doxycycline, estradiol , estradiol , estring , repatha  sureclick, accu-chek guide, levothyroxine , multivitamin with minerals,  potassium chloride  sa, and torsemide .   Allergies: Patient is allergic to amoxicillin -pot clavulanate, atenolol, atorvastatin, canagliflozin, losartan potassium, acarbose, cinnamon, empagliflozin, glimepiride, metformin and related, other, pioglitazone, pravastatin, sitagliptin, spironolactone-hctz, statins, sulfamethoxazole, tramadol  hcl, atorvastatin calcium , crestor [rosuvastatin], rosuvastatin calcium , torsemide , bumex  [bumetanide ], lasix  [furosemide ], and sulfa antibiotics.   Social History: Patient  reports that she has never smoked. She has never used smokeless tobacco. She reports that she does not currently use alcohol. She reports that she does not use drugs.      Objective:    BP 139/74   Pulse 89  Gen: No apparent distress, A&O x 3.  Cathetarized for urine prior to pessary being inserted to rule out UTI as patient reports she has burning.  Pelvic Exam: Normal external female genitalia; Bartholin's and Skene's glands normal in appearance; urethral meatus normal in appearance, no urethral masses or discharge.   Her Gh has increased due to the prolapse protruding with prolapse out. A #4 was too small to re-fit today.   A size #6 short stem gellhorn pessary was fitted with an E-string behind it. It was comfortable, stayed in place with valsalva and was an appropriate size on examination, with one finger fitting between the pessary and the vaginal walls.Patient was able to void and attempt Bowel movement without pessary expulsion.   Lab Results  Component Value Date   COLORU yellow 09/09/2024   CLARITYU clear 09/09/2024   GLUCOSEUR negative 09/09/2024   BILIRUBINUR negative 09/09/2024   KETONESU negative 04/06/2024   SPECGRAV 1.025 09/09/2024   RBCUR trace-intact (A) 09/09/2024   PHUR 6.5 09/09/2024   PROTEINUR 100 (A) 08/19/2024   UROBILINOGEN 0.2 09/09/2024   LEUKOCYTESUR Negative 09/09/2024      Assessment/Plan:    Assessment: Ms. Halpin is a 73 y.o. with stage  II pelvic organ prolapse who presents for a pessary fitting. Plan: She was fitted with a #6 short stem gellhorn pessary. She will keep the pessary in place until next visit. She has an E-string in place.   Follow-up  in 6 weeks for a pessary check or sooner as needed. May need to decrease to a #5 or #4 Short Stem Gellhorn again once the Gh has been able to reduce in side from support again.   Will send urine for micro and culture to rule out UTI.   All questions were answered.    Jadarian Mckay G Waynette Towers, NP

## 2024-09-09 NOTE — Patient Instructions (Signed)
 Please stop using the estrogen cream. You have the E-string in place to use.   Please call if any issues with the pessary such as trouble urinating, increased pain, or pressure not decreased.

## 2024-09-10 ENCOUNTER — Ambulatory Visit: Payer: Self-pay | Admitting: Obstetrics and Gynecology

## 2024-09-10 LAB — URINE CULTURE

## 2024-09-16 ENCOUNTER — Ambulatory Visit: Admitting: Family Medicine

## 2024-09-16 ENCOUNTER — Encounter: Payer: Self-pay | Admitting: Family Medicine

## 2024-09-16 VITALS — BP 150/84 | HR 90 | Temp 98.2°F | Ht 62.0 in | Wt 191.0 lb

## 2024-09-16 DIAGNOSIS — R600 Localized edema: Secondary | ICD-10-CM

## 2024-09-16 DIAGNOSIS — E1122 Type 2 diabetes mellitus with diabetic chronic kidney disease: Secondary | ICD-10-CM | POA: Diagnosis not present

## 2024-09-16 DIAGNOSIS — N1831 Chronic kidney disease, stage 3a: Secondary | ICD-10-CM

## 2024-09-16 DIAGNOSIS — T466X5A Adverse effect of antihyperlipidemic and antiarteriosclerotic drugs, initial encounter: Secondary | ICD-10-CM | POA: Diagnosis not present

## 2024-09-16 DIAGNOSIS — I5032 Chronic diastolic (congestive) heart failure: Secondary | ICD-10-CM

## 2024-09-16 DIAGNOSIS — D638 Anemia in other chronic diseases classified elsewhere: Secondary | ICD-10-CM

## 2024-09-16 DIAGNOSIS — I2510 Atherosclerotic heart disease of native coronary artery without angina pectoris: Secondary | ICD-10-CM

## 2024-09-16 DIAGNOSIS — E039 Hypothyroidism, unspecified: Secondary | ICD-10-CM

## 2024-09-16 DIAGNOSIS — E785 Hyperlipidemia, unspecified: Secondary | ICD-10-CM | POA: Diagnosis not present

## 2024-09-16 DIAGNOSIS — Z789 Other specified health status: Secondary | ICD-10-CM

## 2024-09-16 DIAGNOSIS — G720 Drug-induced myopathy: Secondary | ICD-10-CM | POA: Diagnosis not present

## 2024-09-16 DIAGNOSIS — E538 Deficiency of other specified B group vitamins: Secondary | ICD-10-CM | POA: Diagnosis not present

## 2024-09-16 MED ORDER — CYANOCOBALAMIN 1000 MCG/ML IJ SOLN
1000.0000 ug | Freq: Once | INTRAMUSCULAR | Status: AC
  Administered 2024-09-16: 1000 ug via INTRAMUSCULAR

## 2024-09-16 NOTE — Progress Notes (Signed)
 Subjective:     Patient ID: [name], female    DOB: 06-11-1951, 73 y.o.   MRN: [id number]  Chief Complaint  Patient presents with   Follow-up    HPI Discussed the use of AI scribe software for clinical note transcription with the patient, who gave verbal consent to proceed.  History of Present Illness [name] is a 73 year old female with heart failure and lung issues who presents with worsening breathing and fluid retention.  She experiences worsening breathing difficulties and fluid retention, with stress exacerbating her condition. She has a history of lung infections and was previously treated with antibiotics. Attempts to schedule an appointment with a pulmonologist have been delayed, with the earliest availability in December or January.  She has a history of  fluid retention, which she suspects may be related to kidney issues. She takes torsemide  every other day to manage fluid retention, though she experiences side effects-sensitive to mult meds.  On CXR-some fluid overload. Her blood pressure at home is stable, with readings such as 129/71 and 116/59. She has missed or canceled cardiology appointments due to various reasons, including weather and scheduling conflicts.  She manages her cholesterol with Repatha , administered every two weeks, and takes Synthroid  50 mcg daily. She takes potassium sporadically, approximately once every other day.  She has a history of kidney disease and reports symptoms such as foamy urine and back pain. She is frustrated with her kidney doctor, who has not provided satisfactory follow-up or explanations for her symptoms.  She mentions a recent issue with a vaginal pessary, which was removed due to poor condition and reinserted after a few weeks. She experienced difficulty urinating, particularly at night, which improved after the pessary was reinserted.  B12 deficiency-here for monthly injection  DM doing best she can on  diet-mult med intolerances.    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 name of MD    Dementia (HCC)    Diabetes mellitus    type 2    Diverticulosis    Dyslipidemia    Dyspnea    with exertion    Dysrhythmia    skips beats   Endometrial cancer (HCC) 2021   Family history of adverse reaction to anesthesia    son stopped breathing during surgery , mother slow to wake up    Fibromyalgia 1980's   Generalized anxiety disorder    GERD (gastroesophageal reflux disease)    occ- will use Tums and Prolosec  prn    Heart murmur    Hyperlipemia    Hypertension    not on blood pressure meds due to allergies - last dose of meds- months ago    Hypothyroidism    Hypothyroidism (acquired)    Internal hemorrhoids    Irritable bowel syndrome 1980's   Liver cancer (HCC)    Major depressive disorder    Migraine  headaches 1980's   On meds-well controlled   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   Morbid obesity (HCC)    Pernicious anemia    Pneumonia 2009   Several times over the past several years   PONV (postoperative nausea and vomiting)    was told by neurologist due to calcificaton in brain not to be put to sleep, N/V during Op and Recovery    Recurrent upper respiratory infection (URI)    Sleep apnea    no cpap   Subarachnoid hemorrhage (HCC) 2010   Vaginal polyp    benign per pt    Past Surgical History:  Procedure Laterality Date   BIOPSY  01/07/2024   Procedure: BIOPSY;  Surgeon: Shila Gustav GAILS, MD;   Location: [facility];  Service: Gastroenterology;;   CATARACT EXTRACTION Right    CHOLECYSTECTOMY  1983   COLONOSCOPY  2008   DB   COLONOSCOPY WITH PROPOFOL  N/A 01/07/2024   Procedure: COLONOSCOPY WITH PROPOFOL ;  Surgeon: Shila Gustav GAILS, MD;  Location: [facility];  Service: Gastroenterology;  Laterality: N/A;   DILATION AND CURETTAGE OF UTERUS  1972   ESOPHAGOGASTRODUODENOSCOPY (EGD) WITH PROPOFOL  N/A 01/07/2024   Procedure: ESOPHAGOGASTRODUODENOSCOPY (EGD) WITH PROPOFOL ;  Surgeon: Shila Gustav GAILS, MD;  Location: [facility];  Service: Gastroenterology;  Laterality: N/A;   HYSTEROSCOPY WITH D & C  06/29/2011   Procedure: DILATATION AND CURETTAGE (D&C) /HYSTEROSCOPY;  Surgeon: Glenys GORMAN Birk, MD;  Location: [facility];  Service: Gynecology;  Laterality: N/A;   ROBOTIC ASSISTED TOTAL HYSTERECTOMY WITH BILATERAL SALPINGO OOPHERECTOMY N/A 10/27/2020   Procedure: XI ROBOTIC ASSISTED TOTAL HYSTERECTOMY WITH BILATERAL SALPINGO OOPHORECTOMY;  Surgeon: Eloy Herring, MD;  Location: [facility];  Service: Gynecology;  Laterality: N/A;   SENTINEL NODE BIOPSY N/A 10/27/2020   Procedure: SENTINEL NODE BIOPSY;  Surgeon: Eloy Herring, MD;  Location: [facility];  Service: Gynecology;  Laterality: N/A;     Current Outpatient Medications:    aspirin  EC 81 MG tablet, Take 81 mg by mouth daily. Swallow whole., Disp: , Rfl:    Cholecalciferol  (VITAMIN D3) 25 MCG (1000 UT) capsule, Take 1,000 Units by mouth daily., Disp: , Rfl:    estradiol  (ESTRACE ) 0.1 MG/GM vaginal cream, Place 0.5 g vaginally 2 (two) times a week. Place 0.5g nightly for three weeks then twice a week after., Disp: 42 g, Rfl: 11   estradiol  (ESTRING ) 7.5 MCG/24HR vaginal ring, Place 1 each vaginally every 3 (three) months. Please do not fill until closer to her appointment on 10-05-2024. She is to use the vaginal cream until then. Thank you., Disp: 1 each, Rfl: 2   ESTRING  7.5 MCG/24HR vaginal ring, Place 1 each vaginally every 3 (three) months.,  Disp: 1 each, Rfl: 3   Evolocumab  (REPATHA  SURECLICK) 140 MG/ML SOAJ, INJECT 1 PEN SUBCUTANEOUSLY EVERY 14 DAYS, Disp: 6 mL, Rfl: 3   glucose blood (ACCU-CHEK GUIDE) test strip, Use to test blood sugar 3 times daily, Disp: 100 each, Rfl: 2   levothyroxine  (SYNTHROID ) 50 MCG tablet, TAKE 1 TABLET BY MOUTH EVERY DAY BEFORE BREAKFAST, Disp: 90 tablet, Rfl: 3   Multiple Vitamin (MULTIVITAMIN WITH MINERALS) TABS tablet, Take 1 tablet by mouth daily., Disp: 30 tablet, Rfl: 0   potassium chloride  SA (KLOR-CON  M) 20 MEQ tablet, Take 1 tablet (20 mEq total) by mouth 2 (two) times daily., Disp: 180 tablet, Rfl: 0   torsemide  (DEMADEX ) 20 MG tablet, TAKE 1 TABLET BY MOUTH ONCE DAILY, Disp: 90 tablet, Rfl: 1  Allergies  Allergen Reactions  Amoxicillin -Pot Clavulanate Other (See Comments)    Caused jaundice  Jaundice   Atenolol Other (See Comments)    Stomach problems, chills   Atorvastatin Other (See Comments)    Retained fluid   Canagliflozin Other (See Comments)    ALL DIABETIC MEDS PER PT   abdominal pain, headache   Losartan Potassium Other (See Comments)    chills, HA, stomach pain   Acarbose Nausea Only and Other (See Comments)     headache,nausea   Cinnamon Other (See Comments)    Stomach pain   Empagliflozin Other (See Comments)     headaches   Glimepiride Other (See Comments)    ALL DIABETIC MEDS PER PT  abdominal pain, headache   Metformin And Related Nausea And Vomiting and Other (See Comments)    Patient states that she has severe chills, headache and cramping additionally. Says blood sugar is uncontrolled because of this   Other Other (See Comments)    ALL DIABETIC MEDS PER PT    Pioglitazone Other (See Comments)    ALL DIABETIC MEDS PER PT = peripheral edema   Pravastatin Other (See Comments)    Retained fluid   Sitagliptin Other (See Comments)    ALL DIABETIC MEDS PER PT  Other reaction(s): headache   Spironolactone-Hctz Other (See Comments)   Statins Other (See  Comments)    Myalgia   Sulfamethoxazole Other (See Comments)    ALL DIABETIC MEDS PER PT    Tramadol  Hcl Itching   Atorvastatin Calcium  Other (See Comments)     retain fld   Crestor [Rosuvastatin] Other (See Comments)   Rosuvastatin Calcium     Torsemide  Nausea Only    Gi upset, chills, abd pain,bloating   Bumex  [Bumetanide ] Rash and Other (See Comments)    Chills, rash, headaches, stomach issues also   Lasix  [Furosemide ] Rash and Other (See Comments)    Sugars increased   Sulfa Antibiotics Rash and Other (See Comments)    Mother has told patient in the past not to take, she cannot recall there reaction   ROS neg/noncontributory except as noted HPI/below      Objective:     BP (!) 150/84 (BP Location: Left Arm, Patient Position: Sitting, Cuff Size: Large)   Pulse 90   Temp 98.2 F (36.8 C)   Ht 5' 2 (1.575 m)   Wt 191 lb (86.6 kg)   SpO2 95%   BMI 34.93 kg/m  Wt Readings from Last 3 Encounters:  09/16/24 191 lb (86.6 kg)  08/13/24 187 lb (84.8 kg)  07/29/24 193 lb 3.2 oz (87.6 kg)    Physical Exam   Gen: [facility] NAD HEENT: NCAT, conjunctiva not injected, sclera nonicteric NECK:  supple, no thyromegaly, no nodes, no carotid bruits CARDIAC: RRR, S1S2+, no murmur. DP 1+B LUNGS: CTAB. No wheezes ABDOMEN:  BS+, soft, NTND, No HSM, no masses EXT:  1+ edema MSK: no gross abnormalities-cane.  NEURO: A&O x3.  CN II-XII intact.  PSYCH: normal mood. Good eye contact     Assessment & Plan:  B12 deficiency -     Cyanocobalamin   Anemia, chronic disease -     CBC with Differential/Platelet -     IBC + Ferritin  Type 2 diabetes mellitus with stage 3a chronic kidney disease, without long-term current use of insulin  (HCC) -     Comprehensive metabolic panel with GFR -     Hemoglobin A1c  Local edema -     Comprehensive metabolic panel with GFR -  Magnesium   Statin myopathy  Acquired hypothyroidism -     TSH  Dyslipidemia -     Comprehensive metabolic panel  with GFR -     Lipid panel  Medication intolerance  Chronic diastolic congestive heart failure (HCC) -     Ambulatory referral to Cardiology  Coronary artery disease involving native coronary artery of native heart without angina pectoris -     Ambulatory referral to Cardiology  Assessment and Plan Assessment & Plan Heart failure with pulmonary symptoms   She experiences worsening pulmonary symptoms and increased breathing difficulties intermitt. A recent lung infection was treated with antibiotics. Chest X-ray shows excess fluid in the lungs, suggesting possible heart failure. Blood oxygen level is 95%, which is not alarming but lower than normal. Her pulmonologist appointment is delayed until December or January. Continue torsemide  every other day and schedule an appointment with a [facility]ologist at [facility] for further evaluation. Intol mult meds  Chronic kidney disease   She reports foamy urine and back pain, possibly due to fluid retention. Recent blood work shows stable kidney function. She is frustrated with her nephrologist's communication and management.  Hypertension   Her blood pressure readings at home are stable, ranging from 116/59 to 129/71. An elevated reading of 150/84 was noted in the office, likely due to stress and situational factors. Continue current blood pressure management and monitor blood pressure at home.  Hypothyroidism   Her condition is well-managed on the current Synthroid  dosage of 50 mcg daily. Continue Synthroid  50 mcg daily.  Hyperlipidemia   She continues Repatha  every two weeks for cholesterol management. Continue Repatha  every two weeks.  H/O CAD-occ cp-refer Card.  Type 2 DM-diet alone and struggles.  Intol meds.     Return in about 4 weeks (around 10/14/2024) for chronic follow-up.  Jenkins CHRISTELLA Carrel, MD

## 2024-09-16 NOTE — Patient Instructions (Signed)
 It was very nice to see you today!  Refer Card   PLEASE NOTE:  If you had any lab tests please let us  know if you have not heard back within a few days. You may see your results on MyChart before we have a chance to review them but we will give you a call once they are reviewed by us . If we ordered any referrals today, please let us  know if you have not heard from their office within the next week.   Please try these tips to maintain a healthy lifestyle:  Eat most of your calories during the day when you are active. Eliminate processed foods including packaged sweets (pies, cakes, cookies), reduce intake of potatoes, white bread, white pasta, and white rice. Look for whole grain options, oat flour or almond flour.  Each meal should contain half fruits/vegetables, one quarter protein, and one quarter carbs (no bigger than a computer mouse).  Cut down on sweet beverages. This includes juice, soda, and sweet tea. Also watch fruit intake, though this is a healthier sweet option, it still contains natural sugar! Limit to 3 servings daily.  Drink at least 1 glass of water  with each meal and aim for at least 8 glasses per day  Exercise at least 150 minutes every week.

## 2024-09-17 ENCOUNTER — Ambulatory Visit: Payer: Self-pay | Admitting: Family Medicine

## 2024-09-17 LAB — COMPREHENSIVE METABOLIC PANEL WITH GFR
ALT: 10 U/L (ref 0–35)
AST: 20 U/L (ref 0–37)
Albumin: 4.1 g/dL (ref 3.5–5.2)
Alkaline Phosphatase: 52 U/L (ref 39–117)
BUN: 22 mg/dL (ref 6–23)
CO2: 26 meq/L (ref 19–32)
Calcium: 9.3 mg/dL (ref 8.4–10.5)
Chloride: 106 meq/L (ref 96–112)
Creatinine, Ser: 1.38 mg/dL — ABNORMAL HIGH (ref 0.40–1.20)
GFR: 37.59 mL/min — ABNORMAL LOW (ref >60.00)
Glucose, Bld: 127 mg/dL — ABNORMAL HIGH (ref 70–99)
Potassium: 4.6 meq/L (ref 3.5–5.1)
Sodium: 140 meq/L (ref 135–145)
Total Bilirubin: 0.6 mg/dL (ref 0.2–1.2)
Total Protein: 7 g/dL (ref 6.0–8.3)

## 2024-09-17 LAB — LIPID PANEL
Cholesterol: 106 mg/dL (ref 0–200)
HDL: 47.8 mg/dL (ref >39.00)
LDL Cholesterol: 38 mg/dL (ref 0–99)
NonHDL: 57.83
Total CHOL/HDL Ratio: 2
Triglycerides: 98 mg/dL (ref 0.0–149.0)
VLDL: 19.6 mg/dL (ref 0.0–40.0)

## 2024-09-17 LAB — TSH: TSH: 2.08 u[IU]/mL (ref 0.35–5.50)

## 2024-09-17 LAB — IBC + FERRITIN
Ferritin: 99.7 ng/mL (ref 10.0–291.0)
Iron: 50 ug/dL (ref 42–145)
Saturation Ratios: 14.1 % — ABNORMAL LOW (ref 20.0–50.0)
TIBC: 354.2 ug/dL (ref 250.0–450.0)
Transferrin: 253 mg/dL (ref 212.0–360.0)

## 2024-09-17 LAB — HEMOGLOBIN A1C: Hgb A1c MFr Bld: 6 % (ref 4.6–6.5)

## 2024-09-17 LAB — CBC WITH DIFFERENTIAL/PLATELET
Basophils Absolute: 0 K/uL (ref 0.0–0.1)
Basophils Relative: 0.8 % (ref 0.0–3.0)
Eosinophils Absolute: 0.1 K/uL (ref 0.0–0.7)
Eosinophils Relative: 2.7 % (ref 0.0–5.0)
HCT: 32.6 % — ABNORMAL LOW (ref 36.0–46.0)
Hemoglobin: 10.7 g/dL — ABNORMAL LOW (ref 12.0–15.0)
Lymphocytes Relative: 23.2 % (ref 12.0–46.0)
Lymphs Abs: 0.8 K/uL (ref 0.7–4.0)
MCHC: 32.8 g/dL (ref 30.0–36.0)
MCV: 90.9 fl (ref 78.0–100.0)
Monocytes Absolute: 0.3 K/uL (ref 0.1–1.0)
Monocytes Relative: 8.8 % (ref 3.0–12.0)
Neutro Abs: 2.3 K/uL (ref 1.4–7.7)
Neutrophils Relative %: 64.5 % (ref 43.0–77.0)
Platelets: 210 K/uL (ref 150.0–400.0)
RBC: 3.59 Mil/uL — ABNORMAL LOW (ref 3.87–5.11)
RDW: 15.7 % — ABNORMAL HIGH (ref 11.5–15.5)
WBC: 3.6 K/uL — ABNORMAL LOW (ref 4.0–10.5)

## 2024-09-17 LAB — MAGNESIUM: Magnesium: 1.9 mg/dL (ref 1.5–2.5)

## 2024-09-17 NOTE — Progress Notes (Signed)
 Labs are stable.  Anemia stable

## 2024-09-18 NOTE — Telephone Encounter (Signed)
 Left VM about stable lab results

## 2024-09-23 ENCOUNTER — Encounter: Payer: Self-pay | Admitting: Nurse Practitioner

## 2024-09-23 ENCOUNTER — Ambulatory Visit: Attending: Cardiology | Admitting: Nurse Practitioner

## 2024-09-23 VITALS — BP 148/88 | HR 92 | Ht 62.0 in | Wt 190.2 lb

## 2024-09-23 DIAGNOSIS — I6523 Occlusion and stenosis of bilateral carotid arteries: Secondary | ICD-10-CM | POA: Diagnosis not present

## 2024-09-23 DIAGNOSIS — N1831 Chronic kidney disease, stage 3a: Secondary | ICD-10-CM

## 2024-09-23 DIAGNOSIS — I10 Essential (primary) hypertension: Secondary | ICD-10-CM

## 2024-09-23 DIAGNOSIS — E1122 Type 2 diabetes mellitus with diabetic chronic kidney disease: Secondary | ICD-10-CM | POA: Diagnosis not present

## 2024-09-23 DIAGNOSIS — E785 Hyperlipidemia, unspecified: Secondary | ICD-10-CM

## 2024-09-23 DIAGNOSIS — I2510 Atherosclerotic heart disease of native coronary artery without angina pectoris: Secondary | ICD-10-CM | POA: Diagnosis not present

## 2024-09-23 NOTE — Patient Instructions (Signed)
 Medication Instructions:  Take Torsemide  20 mg every day for 3 days then 20 mg every other day Continue all other medications *If you need a refill on your cardiac medications before your next appointment, please call your pharmacy*  Lab Work: None ordered  Testing/Procedures: Echo   first available   Follow-Up: At [facility], you and your health needs are our priority.  As part of our continuing mission to provide you with exceptional heart care, our providers are all part of one team.  This team includes your primary Cardiologist (physician) and Advanced Practice Providers or APPs (Physician Assistants and Nurse Practitioners) who all work together to provide you with the care you need, when you need it.  Your next appointment:  6 to 8 weeks    Provider:  Dr.Sullivan        Schedule appointment with Pharm D Heart Failure    See Allergist   We recommend signing up for the patient portal called MyChart.  Sign up information is provided on this After Visit Summary.  MyChart is used to connect with patients for Virtual Visits (Telemedicine).  Patients are able to view lab/test results, encounter notes, upcoming appointments, etc.  Non-urgent messages can be sent to your provider as well.   To learn more about what you can do with MyChart, go to [email or web address].

## 2024-09-23 NOTE — Progress Notes (Signed)
 Office Visit    Patient Name: [name] Date of Encounter: 09/23/2024  Primary Care Provider:  Wendolyn Jenkins Jansky, MD Primary Cardiologist:  Georganna Archer, MD  Chief Complaint    73 year old female with a history of  nonobstructive CAD, HFpEF, carotid artery stenosis, hypertension, hyperlipidemia, type 2 diabetes, CKD, chronic diarrhea, hypothyroidism, B12 deficiency, anemia and dementia. who presents for follow-up related to CAD and heart failure.   Past Medical History    Past Medical History:  Diagnosis Date   Allergy    Anemia    Arthritis    Asthma    Dianosed years ago-no meds at this time   Cancer [facility]) 2021   endometrial    Cataract    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 name of MD    Dementia (HCC)    Diabetes mellitus    type 2    Diverticulosis    Dyslipidemia    Dyspnea    with exertion    Dysrhythmia    skips beats   Endometrial cancer (HCC) 2021   Family history of adverse reaction to anesthesia    son stopped breathing during surgery , mother slow to wake up    Fibromyalgia 1980's   Generalized anxiety disorder    GERD (gastroesophageal reflux disease)    occ- will use Tums and Prolosec  prn    Heart murmur    Hyperlipemia    Hypertension    not on blood pressure meds due to allergies - last dose of meds- months ago    Hypothyroidism    Hypothyroidism (acquired)    Internal hemorrhoids    Irritable bowel syndrome 1980's   Liver cancer (HCC)    Major depressive disorder    Migraine headaches 1980's   On meds-well controlled   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   Morbid obesity (HCC)    Pernicious anemia    Pneumonia 2009   Several times over the past several years   PONV (postoperative nausea and vomiting)    was told by neurologist due to calcificaton in brain not to be put to sleep, N/V  during Op and Recovery    Recurrent upper respiratory infection (URI)    Sleep apnea    no cpap   Subarachnoid hemorrhage (HCC) 2010   Vaginal polyp    benign per pt   Past Surgical History:  Procedure Laterality Date   BIOPSY  01/07/2024   Procedure: BIOPSY;  Surgeon: Shila Gustav GAILS, MD;  Location: [facility];  Service: Gastroenterology;;   CATARACT EXTRACTION Right    CHOLECYSTECTOMY  1983   COLONOSCOPY  2008   DB   COLONOSCOPY WITH PROPOFOL  N/A 01/07/2024   Procedure: COLONOSCOPY WITH PROPOFOL ;  Surgeon: Shila Gustav GAILS, MD;  Location: [facility];  Service: Gastroenterology;  Laterality: N/A;   DILATION AND CURETTAGE OF UTERUS  1972   ESOPHAGOGASTRODUODENOSCOPY (EGD) WITH PROPOFOL  N/A 01/07/2024   Procedure: ESOPHAGOGASTRODUODENOSCOPY (EGD) WITH PROPOFOL ;  Surgeon: Shila Gustav GAILS, MD;  Location: [facility];  Service: Gastroenterology;  Laterality: N/A;   HYSTEROSCOPY WITH D & C  06/29/2011   Procedure: DILATATION AND CURETTAGE (D&C) /HYSTEROSCOPY;  Surgeon: Glenys GORMAN Birk, MD;  Location: [facility];  Service: Gynecology;  Laterality: N/A;   ROBOTIC ASSISTED TOTAL HYSTERECTOMY WITH BILATERAL SALPINGO OOPHERECTOMY N/A 10/27/2020   Procedure: XI ROBOTIC ASSISTED  TOTAL HYSTERECTOMY WITH BILATERAL SALPINGO OOPHORECTOMY;  Surgeon: Eloy Herring, MD;  Location: [facility];  Service: Gynecology;  Laterality: N/A;   SENTINEL NODE BIOPSY N/A 10/27/2020   Procedure: SENTINEL NODE BIOPSY;  Surgeon: Eloy Herring, MD;  Location: [facility];  Service: Gynecology;  Laterality: N/A;    Allergies  Allergies  Allergen Reactions   Amoxicillin -Pot Clavulanate Other (See Comments)    Caused jaundice  Jaundice   Atenolol Other (See Comments)    Stomach problems, chills   Atorvastatin Other (See Comments)    Retained fluid   Canagliflozin Other (See Comments)    ALL DIABETIC MEDS PER PT   abdominal pain, headache   Losartan Potassium Other (See Comments)    chills, HA, stomach pain    Acarbose Nausea Only and Other (See Comments)     headache,nausea   Cinnamon Other (See Comments)    Stomach pain   Empagliflozin Other (See Comments)     headaches   Glimepiride Other (See Comments)    ALL DIABETIC MEDS PER PT  abdominal pain, headache   Metformin And Related Nausea And Vomiting and Other (See Comments)    Patient states that she has severe chills, headache and cramping additionally. Says blood sugar is uncontrolled because of this   Other Other (See Comments)    ALL DIABETIC MEDS PER PT    Pioglitazone Other (See Comments)    ALL DIABETIC MEDS PER PT = peripheral edema   Pravastatin Other (See Comments)    Retained fluid   Sitagliptin Other (See Comments)    ALL DIABETIC MEDS PER PT  Other reaction(s): headache   Spironolactone-Hctz Other (See Comments)   Statins Other (See Comments)    Myalgia   Sulfamethoxazole Other (See Comments)    ALL DIABETIC MEDS PER PT    Tramadol  Hcl Itching   Atorvastatin Calcium  Other (See Comments)     retain fld   Crestor [Rosuvastatin] Other (See Comments)   Rosuvastatin Calcium     Torsemide  Nausea Only    Gi upset, chills, abd pain,bloating   Bumex  [Bumetanide ] Rash and Other (See Comments)    Chills, rash, headaches, stomach issues also   Lasix  [Furosemide ] Rash and Other (See Comments)    Sugars increased   Sulfa Antibiotics Rash and Other (See Comments)    Mother has told patient in the past not to take, she cannot recall there reaction     Labs/Other Studies Reviewed    The following studies were reviewed today:  Cardiac Studies & Procedures   ______________________________________________________________________________________________     ECHOCARDIOGRAM  ECHOCARDIOGRAM LIMITED BUBBLE STUDY 03/12/2023  Narrative ECHOCARDIOGRAM LIMITED REPORT    Patient Name:   JASMARIE COPPOCK Date of Exam: 03/12/2023 Medical Rec #:  [id number]         Height:       62.0 in Accession #:    [id number]        Weight:        184.0 lb Date of Birth:  December 28, 1950        BSA:          1.845 m Patient Age:    71 years          BP:           130/80 mmHg Patient Gender: F                 HR:           91 bpm. Exam Location:  [facility]  Procedure: Limited Echo, Limited Color Doppler, Cardiac Doppler and Saline Contrast Bubble Study  Indications:    PFO Q21.1  History:        Patient has prior history of Echocardiogram examinations, most recent 02/01/2023. Risk Factors:Diabetes, Hypertension and Dyslipidemia.  Sonographer:    Augustin Seals RDCS Referring Phys: [id number] TESSA N CONTE  IMPRESSIONS   1. Left ventricular ejection fraction, by estimation, is 60 to 65%. The left ventricle has normal function. Left ventricular diastolic parameters were normal. 2. Right ventricular systolic function is normal. 3. The mitral valve is normal in structure. 4. The aortic valve was not assessed. 5. Positive color doppler with left to right flow.. Agitated saline contrast bubble study was negative, with no evidence of any interatrial shunt.  FINDINGS Left Ventricle: Left ventricular ejection fraction, by estimation, is 60 to 65%. The left ventricle has normal function. Left ventricular diastolic parameters were normal.  Right Ventricle: Right ventricular systolic function is normal.  Pericardium: There is no evidence of pericardial effusion.  Mitral Valve: The mitral valve is normal in structure.  Tricuspid Valve: The tricuspid valve is normal in structure.  Aortic Valve: The aortic valve was not assessed.  Pulmonic Valve: The pulmonic valve was not assessed.  Aorta: The aortic root was not well visualized.  IAS/Shunts: Positive color doppler with left to right flow. Agitated saline contrast was given intravenously to evaluate for intracardiac shunting. Agitated saline contrast bubble study was negative, with no evidence of any interatrial shunt.  Aditya Sabharwal Electronically signed by Ria Commander Signature Date/Time: 03/13/2023/12:29:18 PM    Final    MONITORS  LONG TERM MONITOR (3-14 DAYS) 01/07/2020  Narrative  NSR with occasional PACs and PVCs.  Rare short bursts of tachycardia lasting only seconds.  No sustained arrhythmias.  No atrial fibrillation.  Continue medical therapy.   CT SCANS  CT CORONARY MORPH W/CTA COR W/SCORE 05/02/2020  Addendum 05/03/2020 12:53 PM ADDENDUM REPORT: 05/03/2020 12:50  HISTORY: Chest pain, nonspecific chest pain  EXAM: Cardiac/Coronary CT  TECHNIQUE: The patient was scanned on a [employer].  PROTOCOL: A 120 kV prospective scan was triggered in the descending thoracic aorta at 111 HU's. Axial non-contrast 3 mm slices were carried out through the heart. The data set was analyzed on a dedicated work station and scored using the Agatson method. Gantry rotation speed was 250 msecs and collimation was 0.6 mm. Beta blockade and 0.8 mg of sl NTG was given. The 3D data set was reconstructed retrospectively in 12 sections; section 11/12 represented best diastolic phase for most assessments. Diastolic phases were analyzed on a dedicated work station using MPR, MIP and VRT modes. The patient received 80mL OMNIPAQUE  IOHEXOL  350 MG/ML SOLN of contrast.  FINDINGS: Coronary calcium  score: The patient's coronary artery calcium  score is 879, which places the patient in the 96 percentile.  Coronary arteries: Normal coronary origins.  Right dominance.  Right Coronary Artery: Large caliber vessel, gives rise to PDA and PLB. Proximal portion has scattered calcified plaque with circumferential calcification and 25-49% stenosis. Mid RCA has scattered calcified plaque with maximum 25-49% stenosis. Distal RCA has scattered calcified plaque with maximum 1-24% stenosis.  Left Main Coronary Artery: Normal caliber vessel. No significant plaque or stenosis.  Left Anterior Descending Coronary Artery: Normal caliber  vessel, tapers prior to apex. Mild scattered calcified plaque throughout proximal portion. Most significant stenosis is 25-49%. 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 LAD gives rise to 1 small diagonal branch with calcified plaque  at ostium.  Left Circumflex Artery: Normal caliber vessel, tapers at mid lateral wall. Mild calcified plaque in proximal portion with 25-49% stenosis. Gives rise to 1 OM branch, no significant stenosis.  Aorta: Normal size, 34 mm at the mid ascending aorta (level of the PA bifurcation) measured double oblique. Mild scattered calcifications. No dissection.  Aortic Valve: No significant calcifications. Trileaflet.  Other findings:  Normal pulmonary vein drainage into the left atrium.  Normal left atrial appendage without a thrombus.  Normal size of the pulmonary artery.  Dense mitral annular calcification.  Calcified bronchi.  PFO present with evidence of left to right flow.  IMPRESSION: 1.  Three vessel mild non-obstructive CAD, CADRADS = 2.  2. Coronary calcium  score of 879. This was 96th percentile for age and sex matched control.  3.  Normal coronary origin with right dominance.  4.  PFO present with evidence of left to right flow.   Electronically Signed By: Shelda Bruckner M.D. On: 05/03/2020 12:50  Narrative EXAM: OVER-READ INTERPRETATION  CT CHEST  The following report is an over-read performed by radiologist Dr. Franky Crease of [facility] on 05/02/2020. This over-read does not include interpretation of cardiac or coronary anatomy or pathology. The coronary CTA interpretation by the cardiologist is attached.  COMPARISON:  None.  FINDINGS: Vascular: Heart is normal size. Aorta normal caliber. Scattered aortic calcifications. Small pericardial effusion.  Mediastinum/Nodes: No adenopathy.  Lungs/Pleura: No confluent opacities or effusions.  Upper Abdomen: Imaging into the upper abdomen shows no  acute findings.  Musculoskeletal: Chest wall soft tissues are unremarkable. No acute bony abnormality.  IMPRESSION: No acute extra cardiac abnormality.  Aortic atherosclerosis.  Small pericardial effusion.  Electronically Signed: By: Franky Crease M.D. On: 05/02/2020 17:26     ______________________________________________________________________________________________     Recent Labs: 09/16/2024: ALT 10; BUN 22; Creatinine, Ser 1.38; Hemoglobin 10.7; Magnesium  1.9; Platelets 210.0; Potassium 4.6; Sodium 140; TSH 2.08  Recent Lipid Panel    Component Value Date/Time   CHOL 106 09/16/2024 1149   CHOL 134 08/16/2021 1018   TRIG 98.0 09/16/2024 1149   HDL 47.80 09/16/2024 1149   HDL 36 (L) 08/16/2021 1018   CHOLHDL 2 09/16/2024 1149   VLDL 19.6 09/16/2024 1149   LDLCALC 38 09/16/2024 1149   LDLCALC 69 08/16/2021 1018   LDLDIRECT 130 (H) 05/10/2020 1659    History of Present Illness    73 year old female with the above past medical history including nonobstructive CAD, HFpEF, carotid artery stenosis, hypertension, hyperlipidemia, type 2 diabetes, CKD, chronic diarrhea, hypothyroidism, B12 deficiency, anemia and dementia.  She established with Dr. Dann in 2020 in the setting of three-vessel coronary artery disease noted on imaging study. She later established with Dr. Alveta.  She has also followed in the advanced hypertension clinic. Cardiac monitor in 11/2019 revealed sinus rhythm with occasional PACs and PVCs, rare short bursts of tachycardia lasting only seconds, no sustained arrhythmia.  Coronary CT angiogram in 04/2020 revealed coronary calcium  score of 879, 96 percentile, nonobstructive CAD.  She was referred to lipid clinic Pharm.D. and started on Repatha . Most recent echocardiogram in 02/2023 showed EF 60 to 65%, normal LV function, no RWMA, normal RV systolic function, no significant valvular abnormalities, negative bubble study.  She also has a history of carotid  artery stenosis, followed by vascular surgery.  Most recent carotid ultrasound in 07/2024 revealed 40 to 59% R ICA stenosis, 1 to 39% LICA stenosis. She was last seen in the office on 10/14/2023 and was  stable from a cardiac standpoint.  She presents today for follow-up. Since her last visit she has been stable overall from a cardiac standpoint. However, since August 2025 she reports increased shortness of breath, nonpitting bilateral lower extremity edema, weight gain, and mild orthopnea. She had a chest x-ray in 07/2024 per PCP which showed mild congestive heart failure. Unfortunately, she has many allergies and has not tolerated diuretics in the past. She is currently taking her torsemide  every other day. Per patient, she reports severe chills that last for hours at a time as well as a rash with each dose of torsemide . She has been referred to an allergist. She reports chronic dizziness, intermittent fleeting palpitations, denies any presyncope or syncope.  She reports rare fleeting chest discomfort at rest.  She denies exertional chest pain.   Home Medications    Current Outpatient Medications  Medication Sig Dispense Refill   aspirin  EC 81 MG tablet Take 81 mg by mouth daily. Swallow whole.     Cholecalciferol  (VITAMIN D3) 25 MCG (1000 UT) capsule Take 1,000 Units by mouth daily.     ESTRING  7.5 MCG/24HR vaginal ring Place 1 each vaginally every 3 (three) months. 1 each 3   Evolocumab  (REPATHA  SURECLICK) 140 MG/ML SOAJ INJECT 1 PEN SUBCUTANEOUSLY EVERY 14 DAYS 6 mL 3   glucose blood (ACCU-CHEK GUIDE) test strip Use to test blood sugar 3 times daily 100 each 2   levothyroxine  (SYNTHROID ) 50 MCG tablet TAKE 1 TABLET BY MOUTH EVERY DAY BEFORE BREAKFAST 90 tablet 3   Multiple Vitamin (MULTIVITAMIN WITH MINERALS) TABS tablet Take 1 tablet by mouth daily. 30 tablet 0   potassium chloride  SA (KLOR-CON  M) 20 MEQ tablet Take 1 tablet (20 mEq total) by mouth 2 (two) times daily. 180 tablet 0   torsemide   (DEMADEX ) 20 MG tablet TAKE 1 TABLET BY MOUTH ONCE DAILY (Patient taking differently: Take 20 mg by mouth every other day.) 90 tablet 1   estradiol  (ESTRACE ) 0.1 MG/GM vaginal cream Place 0.5 g vaginally 2 (two) times a week. Place 0.5g nightly for three weeks then twice a week after. (Patient not taking: Reported on 09/23/2024) 42 g 11   estradiol  (ESTRING ) 7.5 MCG/24HR vaginal ring Place 1 each vaginally every 3 (three) months. Please do not fill until closer to her appointment on 10-05-2024. She is to use the vaginal cream until then. Thank you. (Patient not taking: Reported on 09/23/2024) 1 each 2   No current facility-administered medications for this visit.     Review of Systems    She denies pnd, n, v, syncope, weight gain, or early satiety. All other systems reviewed and are otherwise negative except as noted above.   Physical Exam    VS:  BP (!) 148/88   Pulse 92   Ht 5' 2 (1.575 m)   Wt 190 lb 3.2 oz (86.3 kg)   SpO2 99%   BMI 34.79 kg/m   STOP-Bang Score:         GEN: Well nourished, well developed, in no acute distress. HEENT: normal. Neck: Supple, no JVD, carotid bruits, or masses. Cardiac: RRR, no murmurs, rubs, or gallops. No clubbing, cyanosis, nonpitting bilateral lower extremity edema.  Radials/DP/PT 2+ and equal bilaterally.  Respiratory:  Respirations regular and unlabored, fine crackles to lung bases bilaterally. GI: Soft, nontender, nondistended, BS + x 4. MS: no deformity or atrophy. Skin: warm and dry, no rash. Neuro:  Strength and sensation are intact. Psych: Normal affect.  Accessory Clinical Findings  ECG personally reviewed by me today -    - no EKG in office today.    Lab Results  Component Value Date   WBC 3.6 (L) 09/16/2024   HGB 10.7 (L) 09/16/2024   HCT 32.6 (L) 09/16/2024   MCV 90.9 09/16/2024   PLT 210.0 09/16/2024   Lab Results  Component Value Date   CREATININE 1.38 (H) 09/16/2024   BUN 22 09/16/2024   NA 140 09/16/2024   K 4.6  09/16/2024   CL 106 09/16/2024   CO2 26 09/16/2024   Lab Results  Component Value Date   ALT 10 09/16/2024   AST 20 09/16/2024   ALKPHOS 52 09/16/2024   BILITOT 0.6 09/16/2024   Lab Results  Component Value Date   CHOL 106 09/16/2024   HDL 47.80 09/16/2024   LDLCALC 38 09/16/2024   LDLDIRECT 130 (H) 05/10/2020   TRIG 98.0 09/16/2024   CHOLHDL 2 09/16/2024    Lab Results  Component Value Date   HGBA1C 6.0 09/16/2024    Assessment & Plan    1.  HFpEF: Most recent echocardiogram in 02/2023 showed EF 60 to 65%, normal LV function, no RWMA, normal RV systolic function, no significant valvular abnormalities, negative bubble study.  Since August 2025 she reports increased shortness of breath, nonpitting bilateral lower extremity edema, weight gain, and mild orthopnea. She had a chest x-ray in 07/2024 per PCP which showed mild congestive heart failure. Unfortunately, she has many allergies and has not tolerated diuretics in the past. She is currently taking her torsemide  every other day. Per patient, she reports severe chills that last for hours at a time as well as a rash with each dose of torsemide . She has 25 allergies listed in her chart.  She has been referred to an allergist. She previously took Jardiance, did not tolerate in the setting of headaches. She has allergies listed to Lasix , hydrochlorothiazide, spironolactone.  She does have fine crackles to bilateral lung bases.  Advised her to take her torsemide  for 3 days followed by every other day, advised her to take it as needed on her off days in the setting of increased swelling, weight gain, shortness of breath.  Will update echocardiogram in the setting of progressive symptoms.  Reviewed ED precautions.  Will have her meet with Pharm.D. to discuss any additional medication options for heart failure management given numerous allergies.  Reviewed daily weights.  Continue torsemide  as above.   2.  Nonobstructive CAD: Coronary CT angiogram  in 04/2020 revealed coronary calcium  score of 879, 96 percentile, nonobstructive CAD.  She reports rare fleeting chest discomfort at rest, otherwise, stable with no anginal symptoms. No indication for ischemic evaluation. Continue aspirin , Repatha .  3. Carotid artery stenosis: Most recent carotid ultrasound in 07/2024 revealed 40 to 59% R ICA stenosis, 1 to 39% LICA stenosis. Asymptomatic.  Repeat carotid ultrasound scheduled for 01/2025.  Followed by vascular surgery.  Continue aspirin , Repatha .  4. Hypertension: BP well controlled. Continue current antihypertensive regimen.   5. Hyperlipidemia: LDL was 38 in 09/2024. Continue Repatha .  6. Type 2 diabetes: A1c was 6.0 in 09/2024.  Monitored and managed per PCP.  7. CKD stage IIIb: Creatinine was stable at 1.38 in 09/2024.  Monitored per PCP.  8. Disposition:  Follow-up in 6 to 8 weeks we will have her establish with Dr. Floretta.  HYPERTENSION CONTROL Vitals:   09/23/24 0859 09/23/24 0949  BP: (!) 152/88 (!) 148/88    The patient's blood pressure is elevated above  target today.  In order to address the patient's elevated BP: Blood pressure will be monitored at home to determine if medication changes need to be made.; The blood pressure is usually elevated in clinic.  Blood pressures monitored at home have been optimal.      Damien JAYSON Braver, NP 09/23/2024, 9:58 AM

## 2024-10-05 ENCOUNTER — Encounter: Payer: Self-pay | Admitting: Obstetrics and Gynecology

## 2024-10-05 ENCOUNTER — Ambulatory Visit: Admitting: Obstetrics and Gynecology

## 2024-10-05 VITALS — BP 159/83 | HR 85

## 2024-10-05 DIAGNOSIS — Z466 Encounter for fitting and adjustment of urinary device: Secondary | ICD-10-CM

## 2024-10-05 DIAGNOSIS — N952 Postmenopausal atrophic vaginitis: Secondary | ICD-10-CM | POA: Diagnosis not present

## 2024-10-05 DIAGNOSIS — N812 Incomplete uterovaginal prolapse: Secondary | ICD-10-CM

## 2024-10-05 DIAGNOSIS — N993 Prolapse of vaginal vault after hysterectomy: Secondary | ICD-10-CM

## 2024-10-05 DIAGNOSIS — N8110 Cystocele, unspecified: Secondary | ICD-10-CM

## 2024-10-05 NOTE — Progress Notes (Signed)
 [name]   Subjective:     Chief Complaint:  Chief Complaint  Patient presents with   pessary check - feels like pessary could be rubbing   History of Present Illness: [name] is a 73 y.o. female with stage II pelvic organ prolapse who presents for a pessary check. She is using a size #6 short stem gellhorn pessary. The pessary has been working well and she has no complaints. She is using vaginal estrogen in the form of an E-string. She denies vaginal bleeding.  Past Medical History: Patient  has a past medical history of Allergy, Anemia, Arthritis, Asthma, Cancer (HCC) (2021), Cataract, Chronic kidney disease (1997), Dementia (HCC), Diabetes mellitus, Diverticulosis, Dyslipidemia, Dyspnea, Dysrhythmia, Endometrial cancer (HCC) (2021), Family history of adverse reaction to anesthesia, Fibromyalgia (1980's), Generalized anxiety disorder, GERD (gastroesophageal reflux disease), Heart murmur, Hyperlipemia, Hypertension, Hypothyroidism, Hypothyroidism (acquired), Internal hemorrhoids, Irritable bowel syndrome (1980's), Liver cancer (HCC), Major depressive disorder, Migraine headaches (1980's), Mild neurocognitive disorder (10/05/2019), Morbid obesity (HCC), Pernicious anemia, Pneumonia (2009), PONV (postoperative nausea and vomiting), Recurrent upper respiratory infection (URI), Sleep apnea, Subarachnoid hemorrhage (HCC) (2010), and Vaginal polyp.   Past Surgical History: She  has a past surgical history that includes Dilation and curettage of uterus (1972); Cholecystectomy (1983); Hysteroscopy with D & C (06/29/2011); Colonoscopy (2008); Robotic assisted total hysterectomy with bilateral salpingo oophorectomy (N/A, 10/27/2020); Sentinel node biopsy (N/A, 10/27/2020); Cataract extraction (Right); Colonoscopy with propofol  (N/A, 01/07/2024); Esophagogastroduodenoscopy (egd) with propofol  (N/A, 01/07/2024); and biopsy (01/07/2024).   Medications: She has a current medication  list which includes the following prescription(s): aspirin  ec, vitamin d3, estring , repatha  sureclick, accu-chek guide, levothyroxine , multivitamin with minerals, potassium chloride  sa, torsemide , estradiol , and estradiol .   Allergies: Patient is allergic to amoxicillin -pot clavulanate, atenolol, atorvastatin, canagliflozin, losartan potassium, acarbose, cinnamon, empagliflozin, glimepiride, metformin and related, other, pioglitazone, pravastatin, sitagliptin, spironolactone-hctz, statins, sulfamethoxazole, tramadol  hcl, atorvastatin calcium , crestor [rosuvastatin], rosuvastatin calcium , torsemide , bumex  [bumetanide ], lasix  [furosemide ], and sulfa antibiotics.   Social History: Patient  reports that she has never smoked. She has never used smokeless tobacco. She reports that she does not currently use alcohol. She reports that she does not use drugs.      Objective:    Physical Exam: BP (!) 159/83   Pulse 85  Gen: No apparent distress, A&O x 3. Detailed Urogynecologic Evaluation:  Pelvic Exam: Normal external female genitalia; Bartholin's and Skene's glands normal in appearance; urethral meatus normal in appearance, no urethral masses or discharge. The pessary was noted to be in place. It was removed and cleaned. Speculum exam revealed no lesions in the vagina. The pessary was replaced to a smaller size due to Gh decreasing in size. #5 Long Stem Gellhorn placed (Lot [id number]). It was comfortable to the patient and fit well.    Assessment/Plan:    Assessment: [name] is a 73 y.o. with stage II pelvic organ prolapse here for a pessary check. She is doing well.  Plan: She will keep the pessary in place until next visit. She will continue to use estrogen in the form on an E-string. She will follow-up in 3 months for a pessary check or sooner as needed.  All questions were answered.

## 2024-10-10 ENCOUNTER — Other Ambulatory Visit: Payer: Self-pay | Admitting: Gastroenterology

## 2024-10-19 ENCOUNTER — Ambulatory Visit: Admitting: Family Medicine

## 2024-10-22 ENCOUNTER — Ambulatory Visit: Admitting: Family Medicine

## 2024-10-22 ENCOUNTER — Ambulatory Visit: Admitting: Allergy & Immunology

## 2024-10-22 ENCOUNTER — Encounter: Payer: Self-pay | Admitting: Allergy & Immunology

## 2024-10-22 ENCOUNTER — Encounter: Payer: Self-pay | Admitting: Family Medicine

## 2024-10-22 ENCOUNTER — Ambulatory Visit: Admitting: Podiatry

## 2024-10-22 VITALS — BP 112/76 | HR 76 | Temp 97.4°F | Ht 62.0 in | Wt 188.5 lb

## 2024-10-22 VITALS — BP 140/90 | HR 83 | Temp 98.3°F | Ht 60.0 in | Wt 188.0 lb

## 2024-10-22 DIAGNOSIS — T7840XA Allergy, unspecified, initial encounter: Secondary | ICD-10-CM

## 2024-10-22 DIAGNOSIS — E538 Deficiency of other specified B group vitamins: Secondary | ICD-10-CM

## 2024-10-22 DIAGNOSIS — R232 Flushing: Secondary | ICD-10-CM

## 2024-10-22 DIAGNOSIS — E785 Hyperlipidemia, unspecified: Secondary | ICD-10-CM

## 2024-10-22 DIAGNOSIS — T7840XD Allergy, unspecified, subsequent encounter: Secondary | ICD-10-CM | POA: Diagnosis not present

## 2024-10-22 DIAGNOSIS — G3184 Mild cognitive impairment, so stated: Secondary | ICD-10-CM

## 2024-10-22 DIAGNOSIS — E039 Hypothyroidism, unspecified: Secondary | ICD-10-CM

## 2024-10-22 DIAGNOSIS — R600 Localized edema: Secondary | ICD-10-CM

## 2024-10-22 DIAGNOSIS — I2510 Atherosclerotic heart disease of native coronary artery without angina pectoris: Secondary | ICD-10-CM | POA: Diagnosis not present

## 2024-10-22 MED ORDER — LEVOTHYROXINE SODIUM 50 MCG PO TABS: 50.0000 ug | ORAL_TABLET | Freq: Every day | ORAL | 3 refills | Status: AC

## 2024-10-22 MED ORDER — CYANOCOBALAMIN 1000 MCG/ML IJ SOLN
1000.0000 ug | Freq: Once | INTRAMUSCULAR | Status: AC
  Administered 2024-10-22: 1000 ug via INTRAMUSCULAR

## 2024-10-22 NOTE — Patient Instructions (Addendum)
 1. Allergic reaction - I am going to get some labs to rule out weird causes of allergic reactions. - We will call you in 1-2 weeks with the results of the testing. - Some of these symptoms are not allergic in nature.  - We are going to get some labs to look for something called pheochromocytoma and carcinoid syndrome.  - We will put some pieces together.  - Try taking an antihistamine daily to see if this helps (Claritin or loratadine 5 mg daily) to see if this helps tamp down the reactions. - If they are indeed allergic in nature, this should help.   2. Return in about 6 weeks (around 12/03/2024) with a Nurse Practitioner.      Please inform us  of any Emergency Department visits, hospitalizations, or changes in symptoms. Call us  before going to the ED for breathing or allergy symptoms since we might be able to fit you in for a sick visit. Feel free to contact us  anytime with any questions, problems, or concerns.  It was a pleasure to meet you today!  Websites that have reliable patient information: 1. American Academy of Asthma, Allergy, and Immunology: www.aaaai.org 2. Food Allergy Research and Education (FARE): foodallergy.org 3. Mothers of Asthmatics: http://www.asthmacommunitynetwork.org 4. American College of Allergy, Asthma, and Immunology: www.acaai.org      "Like" us  on Facebook and Instagram for our latest updates!      A healthy democracy works best when [employer] participate! Make sure you are registered to vote! If you have moved or changed any of your contact information, you will need to get this updated before voting! Scan the QR codes below to learn more!

## 2024-10-22 NOTE — Progress Notes (Signed)
 Subjective:     Patient ID: [name], female    DOB: 05/15/1951, 73 y.o.   MRN: [id number]  Chief Complaint  Patient presents with   Diabetes   Hypertension    Pt is here to go over chronic issues    Discussed the use of AI scribe software for clinical note transcription with the patient, who gave verbal consent to proceed.  History of Present Illness [name] is a 73 year old female with medication sensitivities who presents for a regular follow-up. She is accompanied by her adult daughter.  Edema/CHF-She experiences significant side effects from torsemide , including dyspnea, severe chills, and confusion. Despite adherence to the medication regimen, she continues to experience fluid retention. She has a low patent foramen ovale (PFO) noted on prior echocardiogram. She has noticed an increase in chest pain over the past couple of months and occasionally experiences hypertension. Some CAD on scans-has appt w/Card.  H/o CHF.  Saw card NP-told to take torsemide , but just can't tolerate.  Does have appt for echo, card and pharm D  She is concerned about worsening memory, which she attributes to stress or aging. She has difficulty with directions, affecting her ability to drive,  She is sensitive to many medications and is undergoing evaluation by an allergist. She is unsure if the referral was made by her current provider. She also reports worsening vision and had an allergic reaction to a previous eye injection, leading her to discontinue the treatment.  Her sister, a missionary, disapproves of her health decisions, attributing her health issues to not being 'right with God'. Her health impacts her daily life, including needing assistance with transportation.  Hypothyroidism-on synthroid  50.  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 name of MD    Dementia (HCC)    Diabetes mellitus    type 2    Diverticulosis    Dyslipidemia    Dyspnea    with exertion    Dysrhythmia    skips beats   Endometrial cancer (HCC) 2021   Family history of adverse reaction to anesthesia    son stopped breathing during surgery , mother slow to wake up    Fibromyalgia 1980's   Generalized anxiety disorder    GERD (gastroesophageal reflux disease)    occ- will use Tums and Prolosec  prn    Heart murmur    Hyperlipemia    Hypertension    not on blood pressure meds due to allergies - last dose of meds- months ago    Hypothyroidism    Hypothyroidism (acquired)    Internal hemorrhoids    Irritable bowel syndrome 1980's   Liver cancer (HCC)    Major depressive disorder    Migraine headaches 1980's   On meds-well controlled   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   Morbid obesity (HCC)    Pernicious anemia    Pneumonia 2009   Several times over the past  several years   PONV (postoperative nausea and vomiting)    was told by neurologist due to calcificaton in brain not to be put to sleep, N/V during Op and Recovery    Recurrent upper respiratory infection (URI)    Sleep apnea    no cpap   Subarachnoid hemorrhage (HCC) 2010   Vaginal polyp    benign per pt    Past Surgical History:  Procedure Laterality Date   BIOPSY  01/07/2024   Procedure: BIOPSY;  Surgeon: Shila Gustav GAILS, MD;  Location: [facility];  Service: Gastroenterology;;   CATARACT EXTRACTION Right    CHOLECYSTECTOMY  1983   COLONOSCOPY  2008   DB   COLONOSCOPY WITH PROPOFOL  N/A 01/07/2024   Procedure: COLONOSCOPY WITH PROPOFOL ;  Surgeon: Shila Gustav GAILS, MD;   Location: [facility];  Service: Gastroenterology;  Laterality: N/A;   DILATION AND CURETTAGE OF UTERUS  1972   ESOPHAGOGASTRODUODENOSCOPY (EGD) WITH PROPOFOL  N/A 01/07/2024   Procedure: ESOPHAGOGASTRODUODENOSCOPY (EGD) WITH PROPOFOL ;  Surgeon: Shila Gustav GAILS, MD;  Location: [facility];  Service: Gastroenterology;  Laterality: N/A;   HYSTEROSCOPY WITH D & C  06/29/2011   Procedure: DILATATION AND CURETTAGE (D&C) /HYSTEROSCOPY;  Surgeon: Glenys GORMAN Birk, MD;  Location: [facility];  Service: Gynecology;  Laterality: N/A;   ROBOTIC ASSISTED TOTAL HYSTERECTOMY WITH BILATERAL SALPINGO OOPHERECTOMY N/A 10/27/2020   Procedure: XI ROBOTIC ASSISTED TOTAL HYSTERECTOMY WITH BILATERAL SALPINGO OOPHORECTOMY;  Surgeon: Eloy Herring, MD;  Location: [facility];  Service: Gynecology;  Laterality: N/A;   SENTINEL NODE BIOPSY N/A 10/27/2020   Procedure: SENTINEL NODE BIOPSY;  Surgeon: Eloy Herring, MD;  Location: [facility];  Service: Gynecology;  Laterality: N/A;     Current Outpatient Medications:    aspirin  EC 81 MG tablet, Take 81 mg by mouth daily. Swallow whole., Disp: , Rfl:    Cholecalciferol  (VITAMIN D3) 25 MCG (1000 UT) capsule, Take 1,000 Units by mouth daily., Disp: , Rfl:    ESTRING  7.5 MCG/24HR vaginal ring, Place 1 each vaginally every 3 (three) months., Disp: 1 each, Rfl: 3   Evolocumab  (REPATHA  SURECLICK) 140 MG/ML SOAJ, INJECT 1 PEN SUBCUTANEOUSLY EVERY 14 DAYS, Disp: 6 mL, Rfl: 3   glucose blood (ACCU-CHEK GUIDE) test strip, Use to test blood sugar 3 times daily, Disp: 100 each, Rfl: 2   Multiple Vitamin (MULTIVITAMIN WITH MINERALS) TABS tablet, Take 1 tablet by mouth daily., Disp: 30 tablet, Rfl: 0   potassium chloride  SA (KLOR-CON  M) 20 MEQ tablet, Take 1 tablet (20 mEq total) by mouth 2 (two) times daily., Disp: 180 tablet, Rfl: 0   torsemide  (DEMADEX ) 20 MG tablet, TAKE 1 TABLET BY MOUTH ONCE DAILY, Disp: 90 tablet, Rfl: 1   levothyroxine  (SYNTHROID ) 50 MCG tablet, Take 1 tablet (50 mcg total) by  mouth daily before breakfast., Disp: 90 tablet, Rfl: 3  Allergies  Allergen Reactions   Amoxicillin -Pot Clavulanate Other (See Comments)    Caused jaundice  Jaundice   Atenolol Other (See Comments)    Stomach problems, chills   Atorvastatin Other (See Comments)    Retained fluid   Canagliflozin Other (See Comments)    ALL DIABETIC MEDS PER PT   abdominal pain, headache   Losartan Potassium Other (See Comments)    chills, HA, stomach pain   Acarbose Nausea Only and Other (See Comments)     headache,nausea   Cinnamon Other (See Comments)    Stomach pain   Empagliflozin Other (See Comments)     headaches   Glimepiride Other (  See Comments)    ALL DIABETIC MEDS PER PT  abdominal pain, headache   Metformin And Related Nausea And Vomiting and Other (See Comments)    Patient states that she has severe chills, headache and cramping additionally. Says blood sugar is uncontrolled because of this   Other Other (See Comments)    ALL DIABETIC MEDS PER PT    Pioglitazone Other (See Comments)    ALL DIABETIC MEDS PER PT = peripheral edema   Pravastatin Other (See Comments)    Retained fluid   Sitagliptin Other (See Comments)    ALL DIABETIC MEDS PER PT  Other reaction(s): headache   Spironolactone-Hctz Other (See Comments)   Statins Other (See Comments)    Myalgia   Sulfamethoxazole Other (See Comments)    ALL DIABETIC MEDS PER PT    Tramadol  Hcl Itching   Atorvastatin Calcium  Other (See Comments)     retain fld   Crestor [Rosuvastatin] Other (See Comments)   Rosuvastatin Calcium     Torsemide  Nausea Only    Gi upset, chills, abd pain,bloating   Bumex  [Bumetanide ] Rash and Other (See Comments)    Chills, rash, headaches, stomach issues also   Lasix  [Furosemide ] Rash and Other (See Comments)    Sugars increased   Sulfa Antibiotics Rash and Other (See Comments)    Mother has told patient in the past not to take, she cannot recall there reaction   ROS neg/noncontributory except as  noted HPI/below      Objective:     BP 112/76 (BP Location: Left Arm, Patient Position: Sitting, Cuff Size: Normal)   Pulse 76   Temp (!) 97.4 F (36.3 C) (Temporal)   Ht 5' 2 (1.575 m)   Wt 188 lb 8 oz (85.5 kg)   SpO2 98%   BMI 34.48 kg/m  Wt Readings from Last 3 Encounters:  10/22/24 188 lb 8 oz (85.5 kg)  09/23/24 190 lb 3.2 oz (86.3 kg)  09/16/24 191 lb (86.6 kg)    Physical Exam GENERAL: Well developed, well nourished, no acute distress. HEAD EYES EARS NOSE THROAT: Normocephalic, atraumatic, conjunctiva not injected, sclera nonicteric. CARDIAC: Regular rate and rhythm, S1 S2 present, no murmur, dorsalis pedis 1 plus bilaterally. NECK: Supple, no thyromegaly, no nodes, LUNGS: Clear to auscultation bilaterally, no wheezes, no crackles. ABDOMEN: Bowel sounds present, soft, non-tender, non-distended, no hepatosplenomegaly, no masses. EXTREMITIES: Mild leg swelling. MUSCULOSKELETAL: No gross abnormalities. NEUROLOGICAL: Alert and oriented x3, cranial nerves II through XII intact. PSYCHIATRIC: Normal mood, good eye contact.       Assessment & Plan:  Hypothyroidism (acquired) -     Levothyroxine  Sodium; Take 1 tablet (50 mcg total) by mouth daily before breakfast.  Dispense: 90 tablet; Refill: 3  B12 deficiency -     Cyanocobalamin   Coronary artery disease involving native coronary artery of native heart without angina pectoris  Dyslipidemia  Local edema  Mild neurocognitive disorder    Assessment and Plan Assessment & Plan Heart failure with fluid retention   Chronic heart failure is exacerbated by torsemide  side effects, including chills and confusion. Fluid retention persists despite medication. An echocardiogram is scheduled for November 12th, 2025, to assess cardiac function and potential medication adjustments. Continue torsemide  as prescribed, adjusting based on fluid retention and side effects. Medication management will be discussed with a cardiologist  and pharmacist on November 19th, 2025.  Coronary artery disease with angina   She experiences intermittent chest pain, possibly related to coronary artery disease, with noted blood pressure fluctuations.  A cardiologist will evaluate chest pain and potential need for further intervention on November 19th, 2025.  Hypertension   Blood pressure is well-controlled today but fluctuates. Management will be discussed with a cardiologist on November 19th, 2025.  Medication allergies and sensitivities   She has multiple medication allergies and sensitivities, including torsemide . Consultation with an allergist is ongoing to explore management strategies.  Memory impairment   Memory is worsening, possibly due to stress and age, with noted difficulty in directions and daily tasks. Consider transportation assistance for appointments.  Visual impairment   Previous eye injections were discontinued due to an allergic reaction. There is no current treatment for visual impairment.  Hypothyroidism-doing well on 0.05mg .  renewed  HLD-controlled on repatha   B12 def-getting monthly B12 injections-given today  General Health Maintenance   She is up to date on pneumonia vaccinations, having received both the 13 and 23 vaccines. Continue routine health maintenance and vaccinations as recommended.     Return in about 4 weeks (around 11/19/2024) for chronic follow-up.  Jenkins CHRISTELLA Carrel, MD

## 2024-10-22 NOTE — Progress Notes (Signed)
 NEW PATIENT  Date of Service/Encounter:  10/22/24  Consult requested by: Wendolyn Jenkins Jansky, MD   Assessment:   Allergic reaction to multiple medications  [place] - getting workup for pheochromocytoma and carcinoid   Plan/Recommendations:   1. Allergic reaction - I am going to get some labs to rule out weird causes of allergic reactions. - We will call you in 1-2 weeks with the results of the testing. - Some of these symptoms are not allergic in nature.  - We are going to get some labs to look for something called pheochromocytoma and carcinoid syndrome.  - We will put some pieces together.  - Try taking an antihistamine daily to see if this helps (Claritin or loratadine 5 mg daily) to see if this helps tamp down the reactions. - If they are indeed allergic in nature, this should help.  - Discussed the time course of the reactions as well as other possible etiologies.  - Spent time reviewing the labs and imaging as well as previous workup.  - We spent time discussing drug challenges and what would be expected for those.  2. Return in about 6 weeks (around 12/03/2024) with a Nurse Practitioner.    Total of 60 minutes, greater than 50% of which was spent in discussion of treatment and management options.    This note in its entirety was forwarded to the Provider who requested this consultation.  Subjective:   [name] is a 73 y.o. female presenting today for evaluation of  Chief Complaint  Patient presents with   Allergies    See accompanying notes    [name] has a history of the following: Patient Active Problem List   Diagnosis Date Noted   Statin myopathy 09/16/2024   Medication intolerance 01/20/2024   Anemia, chronic disease 01/07/2024   Esophageal dysphagia 01/07/2024   Diarrhea of presumed infectious origin 09/18/2023   Full incontinence of feces 09/18/2023   Iron deficiency anemia 08/11/2023   Acute idiopathic gout of right foot  07/24/2023   Jaundice 03/26/2023   Intermediate stage nonexudative age-related macular degeneration of left eye 12/19/2022   Bilateral hearing loss 06/04/2022   Dizziness 06/04/2022   Perforation of left tympanic membrane 06/04/2022   Primary insomnia 05/06/2022   B12 deficiency 05/06/2022   Local edema 05/06/2022   Neuropathic pain    Essential hypertension    Controlled type 2 diabetes mellitus with hyperglycemia, without long-term current use of insulin  (HCC)    Depression    T7 vertebral fracture (HCC) 02/07/2022   Malnutrition of moderate degree 02/05/2022   Closed T7 fracture (HCC) 01/31/2022   Dementia (HCC) 01/31/2022   Diabetes mellitus without complication (HCC) 01/31/2022   Dyslipidemia 01/31/2022   Hypertension 01/31/2022   Hypothyroidism (acquired) 01/31/2022   Cervical stenosis of spinal canal 01/31/2022   Falls frequently 01/31/2022   Bilateral hand pain 01/31/2022   Vaginal discharge 11/26/2021   Anxiety and depression 10/30/2020   Carotid artery stenosis 10/30/2020   Facial droop 10/29/2020   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   HLD (hyperlipidemia) 10/29/2020   Endometrial cancer (HCC) 10/18/2020   CAD (coronary artery disease) 05/11/2020   Acute encephalopathy 02/26/2020   Hypothyroidism    Cerebral calcification 02/10/2020   Impairment of balance 02/10/2020   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   Essential tremor 06/24/2018   Migraine without aura 06/24/2018   Stress at home 08/28/2015   Post-menopausal bleeding 06/16/2011   Kidney disease 06/16/2011   Colon polyp 06/16/2011  Vaginal polyp 06/16/2011   Chronic diarrhea 06/16/2011   Diverticulitis 06/16/2011   DUB (dysfunctional uterine bleeding) 06/16/2011   Fatty liver 06/16/2011   Type 2 diabetes mellitus with stage 3 chronic kidney disease, without long-term current use of insulin  (HCC) 06/16/2011    History obtained from: chart review and patient.  Discussed the use of AI scribe software  for clinical note transcription with the patient and/or guardian, who gave verbal consent to proceed.  [name] was referred by Wendolyn Jenkins Jansky, MD.     [name] is a 73 y.o. female presenting for an evaluation of a multitude of complaints. She was predominantly referred due to multiple drug allergies and stage three kidney disease who presents for evaluation of her allergic reactions to medications. She was referred by her nephrologist for evaluation of her medication allergies.  She has a history of multiple drug allergies, including a severe reaction to Augmentin  that resulted in hospitalization, severe jaundice, and liver dysfunction. She avoids sulfa drugs due to a family history of allergies, although she has not personally experienced a reaction to them. She has tried various antihistamines, including Claritin and Benadryl , but is unsure of the specific medications due to her kidney condition.  She experiences fluid retention and was found to have fluid in her lungs on a chest x-ray conducted a few months ago. She is currently taking a diuretic every two to three days and experiences severe chills. The chills are described as severe and require multiple blankets for warmth. No fever is present, but there are breathing difficulties associated with the medication.  Her medical history includes stage three kidney disease, first identified in 1997, which has worsened over the years. She also has minimal change disease and nephrotic syndrome. She is not currently on dialysis. She has experienced multiple urinary tract infections and has a history of a total hysterectomy, which resulted in vaginal prolapse.  She has a history of endometrial cancer, for which she underwent surgery three years ago without requiring chemotherapy. Since the surgery, she has felt extremely unwell and notes a decline in cognitive function over the past six to twelve months. She also reports significant stress and a  history of ulcers.  She has macular degeneration, which developed in 1997, and notes a decline in her vision. She reports a history of sinus infections and ear infections, which she attributes to her parents' smoking habits during her childhood. Her family history includes severe allergies in her son, who also suffers from sinus infections. She lives in [place] and does not use electronic medical records, relying on written instructions for her medication management.     Otherwise, there is no history of other atopic diseases, including drug allergies, stinging insect allergies, or contact dermatitis. There is no significant infectious history. Vaccinations are up to date.    Past Medical History: Patient Active Problem List   Diagnosis Date Noted   Statin myopathy 09/16/2024   Medication intolerance 01/20/2024   Anemia, chronic disease 01/07/2024   Esophageal dysphagia 01/07/2024   Diarrhea of presumed infectious origin 09/18/2023   Full incontinence of feces 09/18/2023   Iron deficiency anemia 08/11/2023   Acute idiopathic gout of right foot 07/24/2023   Jaundice 03/26/2023   Intermediate stage nonexudative age-related macular degeneration of left eye 12/19/2022   Bilateral hearing loss 06/04/2022   Dizziness 06/04/2022   Perforation of left tympanic membrane 06/04/2022   Primary insomnia 05/06/2022   B12 deficiency 05/06/2022   Local edema 05/06/2022  Neuropathic pain    Essential hypertension    Controlled type 2 diabetes mellitus with hyperglycemia, without long-term current use of insulin  (HCC)    Depression    T7 vertebral fracture (HCC) 02/07/2022   Malnutrition of moderate degree 02/05/2022   Closed T7 fracture (HCC) 01/31/2022   Dementia (HCC) 01/31/2022   Diabetes mellitus without complication (HCC) 01/31/2022   Dyslipidemia 01/31/2022   Hypertension 01/31/2022   Hypothyroidism (acquired) 01/31/2022   Cervical stenosis of spinal canal 01/31/2022   Falls  frequently 01/31/2022   Bilateral hand pain 01/31/2022   Vaginal discharge 11/26/2021   Anxiety and depression 10/30/2020   Carotid artery stenosis 10/30/2020   Facial droop 10/29/2020   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   HLD (hyperlipidemia) 10/29/2020   Endometrial cancer (HCC) 10/18/2020   CAD (coronary artery disease) 05/11/2020   Acute encephalopathy 02/26/2020   Hypothyroidism    Cerebral calcification 02/10/2020   Impairment of balance 02/10/2020   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   Essential tremor 06/24/2018   Migraine without aura 06/24/2018   Stress at home 08/28/2015   Post-menopausal bleeding 06/16/2011   Kidney disease 06/16/2011   Colon polyp 06/16/2011   Vaginal polyp 06/16/2011   Chronic diarrhea 06/16/2011   Diverticulitis 06/16/2011   DUB (dysfunctional uterine bleeding) 06/16/2011   Fatty liver 06/16/2011   Type 2 diabetes mellitus with stage 3 chronic kidney disease, without long-term current use of insulin  (HCC) 06/16/2011    Medication List:  Allergies as of 10/22/2024       Reactions   Amoxicillin -pot Clavulanate Other (See Comments)   Caused jaundice Jaundice   Atenolol Other (See Comments)   Stomach problems, chills   Atorvastatin Other (See Comments)   Retained fluid   Canagliflozin Other (See Comments)   ALL DIABETIC MEDS PER PT   abdominal pain, headache   Losartan Potassium Other (See Comments)   chills, HA, stomach pain   Acarbose Nausea Only, Other (See Comments)    headache,nausea   Cinnamon Other (See Comments)   Stomach pain   Empagliflozin Other (See Comments)    headaches   Glimepiride Other (See Comments)   ALL DIABETIC MEDS PER PT  abdominal pain, headache   Metformin And Related Nausea And Vomiting, Other (See Comments)   Patient states that she has severe chills, headache and cramping additionally. Says blood sugar is uncontrolled because of this   Other Other (See Comments)   ALL DIABETIC MEDS PER PT     Pioglitazone Other (See Comments)   ALL DIABETIC MEDS PER PT = peripheral edema   Pravastatin Other (See Comments)   Retained fluid   Sitagliptin Other (See Comments)   ALL DIABETIC MEDS PER PT  Other reaction(s): headache   Spironolactone-hctz Other (See Comments)   Statins Other (See Comments)   Myalgia   Sulfamethoxazole Other (See Comments)   ALL DIABETIC MEDS PER PT    Tramadol  Hcl Itching   Atorvastatin Calcium  Other (See Comments)    retain fld   Crestor [rosuvastatin] Other (See Comments)   Rosuvastatin Calcium     Torsemide  Nausea Only   Gi upset, chills, abd pain,bloating   Bumex  [bumetanide ] Rash, Other (See Comments)   Chills, rash, headaches, stomach issues also   Lasix  [furosemide ] Rash, Other (See Comments)   Sugars increased   Sulfa Antibiotics Rash, Other (See Comments)   Mother has told patient in the past not to take, she cannot recall there reaction        Medication List  Accurate as of October 22, 2024 11:59 PM. If you have any questions, ask your nurse or doctor.          STOP taking these medications    pantoprazole  40 MG tablet Commonly known as: PROTONIX  Stopped by: Jenkins CHRISTELLA Carrel       TAKE these medications    Accu-Chek Guide test strip Generic drug: glucose blood Use to test blood sugar 3 times daily   aspirin  EC 81 MG tablet Take 81 mg by mouth daily. Swallow whole.   Estring  7.5 MCG/24HR vaginal ring Generic drug: estradiol  Place 1 each vaginally every 3 (three) months. What changed: Another medication with the same name was removed. Continue taking this medication, and follow the directions you see here. Changed by: Jenkins CHRISTELLA Carrel   levothyroxine  50 MCG tablet Commonly known as: SYNTHROID  Take 1 tablet (50 mcg total) by mouth daily before breakfast. What changed: See the new instructions. Changed by: Jenkins CHRISTELLA Carrel   multivitamin with minerals Tabs tablet Take 1 tablet by mouth daily.   potassium chloride  SA 20 MEQ  tablet Commonly known as: KLOR-CON  M Take 1 tablet (20 mEq total) by mouth 2 (two) times daily.   Repatha  SureClick 140 MG/ML Soaj Generic drug: Evolocumab  INJECT 1 PEN SUBCUTANEOUSLY EVERY 14 DAYS   torsemide  20 MG tablet Commonly known as: DEMADEX  TAKE 1 TABLET BY MOUTH ONCE DAILY   Vitamin D3 1000 units Caps Take 1,000 Units by mouth daily.        Birth History: non-contributory  Developmental History: non-contributory  Past Surgical History: Past Surgical History:  Procedure Laterality Date   BIOPSY  01/07/2024   Procedure: BIOPSY;  Surgeon: Shila Gustav GAILS, MD;  Location: [facility];  Service: Gastroenterology;;   CATARACT EXTRACTION Right    CHOLECYSTECTOMY  1983   COLONOSCOPY  2008   DB   COLONOSCOPY WITH PROPOFOL  N/A 01/07/2024   Procedure: COLONOSCOPY WITH PROPOFOL ;  Surgeon: Shila Gustav GAILS, MD;  Location: [facility];  Service: Gastroenterology;  Laterality: N/A;   DILATION AND CURETTAGE OF UTERUS  1972   ESOPHAGOGASTRODUODENOSCOPY (EGD) WITH PROPOFOL  N/A 01/07/2024   Procedure: ESOPHAGOGASTRODUODENOSCOPY (EGD) WITH PROPOFOL ;  Surgeon: Shila Gustav GAILS, MD;  Location: [facility];  Service: Gastroenterology;  Laterality: N/A;   HYSTEROSCOPY WITH D & C  06/29/2011   Procedure: DILATATION AND CURETTAGE (D&C) /HYSTEROSCOPY;  Surgeon: Glenys GORMAN Birk, MD;  Location: [facility];  Service: Gynecology;  Laterality: N/A;   ROBOTIC ASSISTED TOTAL HYSTERECTOMY WITH BILATERAL SALPINGO OOPHERECTOMY N/A 10/27/2020   Procedure: XI ROBOTIC ASSISTED TOTAL HYSTERECTOMY WITH BILATERAL SALPINGO OOPHORECTOMY;  Surgeon: Eloy Herring, MD;  Location: [facility];  Service: Gynecology;  Laterality: N/A;   SENTINEL NODE BIOPSY N/A 10/27/2020   Procedure: SENTINEL NODE BIOPSY;  Surgeon: Eloy Herring, MD;  Location: [facility];  Service: Gynecology;  Laterality: N/A;     Family History: Family History  Problem Relation Age of Onset   Anesthesia problems Mother        hard to wake post op     Cerebrovascular Disease Mother        Never had stroke, but had CEA.   Liver cancer Father        mets to liver    Bladder Cancer Father    Colon cancer Father        mets to colon    Schizophrenia Daughter        Schizoaffective disorder   Anesthesia problems Son  stopped breathing post op    Tremor Son    Anesthesia problems Granddaughter        PONV   High blood pressure Other    Diabetes Other    Arthritis Other    Schizophrenia Child    Tremor Child    Dementia Neg Hx    Esophageal cancer Neg Hx    Rectal cancer Neg Hx    Stomach cancer Neg Hx    Colon polyps Neg Hx      Social History: Keyairra lives at home with her family. She lives in a house that is 75 years old. There is carpeting throughout the home. There are space heaters with baseboards for heating. She has central cooling. There is one dog inside of the home. There is no tobacco exposure in the home. There are no fumes, chemicals, or dust exposures. There is no HEPA filter in the home. They do not live near an interstate or industrial area.   Review of systems otherwise negative other than that mentioned in the HPI.    Objective:   Blood pressure (!) 140/90, pulse 83, temperature 98.3 F (36.8 C), temperature source Temporal, height 5' (1.524 m), weight 188 lb (85.3 kg), SpO2 99%. Body mass index is 36.72 kg/m.     Physical Exam Vitals reviewed.  Constitutional:      Appearance: She is well-developed.  HENT:     Head: Normocephalic and atraumatic.     Right Ear: Tympanic membrane, ear canal and external ear normal. No drainage, swelling or tenderness. Tympanic membrane is not injected, scarred, erythematous, retracted or bulging.     Left Ear: Tympanic membrane, ear canal and external ear normal. No drainage, swelling or tenderness. Tympanic membrane is not injected, scarred, erythematous, retracted or bulging.     Nose: No nasal deformity, septal deviation, mucosal edema or rhinorrhea.      Right Turbinates: Enlarged, swollen and pale.     Left Turbinates: Enlarged, swollen and pale.     Right Sinus: No maxillary sinus tenderness or frontal sinus tenderness.     Left Sinus: No maxillary sinus tenderness or frontal sinus tenderness.     Mouth/Throat:     Mouth: Mucous membranes are not pale and not dry.     Pharynx: Uvula midline.  Eyes:     General:        Right eye: No discharge.        Left eye: No discharge.     Conjunctiva/sclera: Conjunctivae normal.     Right eye: Right conjunctiva is not injected. No chemosis.    Left eye: Left conjunctiva is not injected. No chemosis.    Pupils: Pupils are equal, round, and reactive to light.  Cardiovascular:     Rate and Rhythm: Normal rate and regular rhythm.     Heart sounds: Normal heart sounds.  Pulmonary:     Effort: Pulmonary effort is normal. No tachypnea, accessory muscle usage or respiratory distress.     Breath sounds: Normal breath sounds. No wheezing, rhonchi or rales.     Comments: Moving air well in all lung fields. No increased work of breathing noted.  Chest:     Chest wall: No tenderness.  Abdominal:     Tenderness: There is no abdominal tenderness. There is no guarding or rebound.  Lymphadenopathy:     Head:     Right side of head: No submandibular, tonsillar or occipital adenopathy.     Left side of head: No submandibular, tonsillar  or occipital adenopathy.     Cervical: No cervical adenopathy.  Skin:    Coloration: Skin is not pale.     Findings: No abrasion, erythema, petechiae or rash. Rash is not papular, urticarial or vesicular.  Neurological:     Mental Status: She is alert.  Psychiatric:        Behavior: Behavior is cooperative.      Diagnostic studies: labs sent instead         Marty Shaggy, MD [facility] 

## 2024-10-22 NOTE — Patient Instructions (Signed)

## 2024-10-26 ENCOUNTER — Other Ambulatory Visit: Payer: Self-pay

## 2024-10-26 ENCOUNTER — Encounter: Payer: Self-pay | Admitting: Allergy & Immunology

## 2024-10-26 DIAGNOSIS — T7840XA Allergy, unspecified, initial encounter: Secondary | ICD-10-CM

## 2024-10-26 DIAGNOSIS — R232 Flushing: Secondary | ICD-10-CM

## 2024-10-26 NOTE — Addendum Note (Signed)
 Addended by: Hephzibah Strehle E on: 10/26/2024 03:15 PM   Modules accepted: Orders

## 2024-10-26 NOTE — Progress Notes (Signed)
 Needs to be ordered separately.

## 2024-10-28 ENCOUNTER — Ambulatory Visit: Payer: Self-pay | Admitting: Nurse Practitioner

## 2024-10-28 ENCOUNTER — Ambulatory Visit (HOSPITAL_COMMUNITY)
Admission: RE | Admit: 2024-10-28 | Discharge: 2024-10-28 | Disposition: A | Discharge status: Home / Self Care | Source: Ambulatory Visit | Attending: Nurse Practitioner | Admitting: Nurse Practitioner

## 2024-10-28 DIAGNOSIS — I2510 Atherosclerotic heart disease of native coronary artery without angina pectoris: Secondary | ICD-10-CM | POA: Diagnosis present

## 2024-10-28 DIAGNOSIS — N1831 Chronic kidney disease, stage 3a: Secondary | ICD-10-CM

## 2024-10-28 DIAGNOSIS — I6523 Occlusion and stenosis of bilateral carotid arteries: Secondary | ICD-10-CM | POA: Diagnosis not present

## 2024-10-28 DIAGNOSIS — E785 Hyperlipidemia, unspecified: Secondary | ICD-10-CM

## 2024-10-28 DIAGNOSIS — E1122 Type 2 diabetes mellitus with diabetic chronic kidney disease: Secondary | ICD-10-CM | POA: Diagnosis present

## 2024-10-28 DIAGNOSIS — I10 Essential (primary) hypertension: Secondary | ICD-10-CM

## 2024-10-28 LAB — ECHOCARDIOGRAM COMPLETE
AR max vel: 1.29 cm2
AV Area VTI: 1.36 cm2
AV Area mean vel: 1.32 cm2
AV Mean grad: 6 mmHg
AV Peak grad: 10.9 mmHg
Ao pk vel: 1.65 m/s
Area-P 1/2: 3.65 cm2
MV M vel: 4.68 m/s
MV Peak grad: 87.6 mmHg
MV VTI: 1.32 cm2
S' Lateral: 3.53 cm

## 2024-10-30 ENCOUNTER — Other Ambulatory Visit: Payer: Self-pay | Admitting: Family Medicine

## 2024-10-30 DIAGNOSIS — Z1231 Encounter for screening mammogram for malignant neoplasm of breast: Secondary | ICD-10-CM

## 2024-11-01 LAB — ALPHA-GAL PANEL
Allergen Lamb IgE: <0.1 kU/L
Beef IgE: <0.1 kU/L
IgE (Immunoglobulin E), Serum: 21 [IU]/mL (ref 6–495)
O215-IgE Alpha-Gal: <0.1 kU/L
Pork IgE: <0.1 kU/L

## 2024-11-01 LAB — ALLERGENS W/COMP RFLX AREA 2
Alternaria Alternata IgE: <0.1 kU/L
Aspergillus Fumigatus IgE: <0.1 kU/L
Bermuda Grass IgE: <0.1 kU/L
Cedar, Mountain IgE: <0.1 kU/L
Cladosporium Herbarum IgE: <0.1 kU/L
Cockroach, German IgE: <0.1 kU/L
Common Silver Birch IgE: 0.87 kU/L — AB
Cottonwood IgE: <0.1 kU/L
D Farinae IgE: 0.48 kU/L — AB
D Pteronyssinus IgE: 0.46 kU/L — AB
E001-IgE Cat Dander: 0.23 kU/L — AB
E005-IgE Dog Dander: <0.1 kU/L
Elm, American IgE: <0.1 kU/L
Johnson Grass IgE: <0.1 kU/L
Maple/Box Elder IgE: <0.1 kU/L
Mouse Urine IgE: <0.1 kU/L
Oak, White IgE: 0.39 kU/L — AB
Pecan, Hickory IgE: 0.15 kU/L — AB
Penicillium Chrysogen IgE: <0.1 kU/L
Pigweed, Rough IgE: <0.1 kU/L
Ragweed, Short IgE: <0.1 kU/L
Sheep Sorrel IgE Qn: <0.1 kU/L
Timothy Grass IgE: <0.1 kU/L
White Mulberry IgE: <0.1 kU/L

## 2024-11-01 LAB — PROTEIN ELECTROPHORESIS, SERUM
A/G Ratio: 1.1 (ref 0.7–1.7)
Albumin ELP: 3.6 g/dL (ref 2.9–4.4)
Alpha 1: 0.3 g/dL (ref 0.0–0.4)
Alpha 2: 0.8 g/dL (ref 0.4–1.0)
Beta: 1.2 g/dL (ref 0.7–1.3)
Gamma Globulin: 1.1 g/dL (ref 0.4–1.8)
Globulin, Total: 3.4 g/dL (ref 2.2–3.9)

## 2024-11-01 LAB — CBC WITH DIFF/PLATELET
Basophils Absolute: 0 x10E3/uL (ref 0.0–0.2)
Basos: 1 %
EOS (ABSOLUTE): 0.1 x10E3/uL (ref 0.0–0.4)
Eos: 2 %
Hematocrit: 34.9 % (ref 34.0–46.6)
Hemoglobin: 11.1 g/dL (ref 11.1–15.9)
Immature Grans (Abs): 0 x10E3/uL (ref 0.0–0.1)
Immature Granulocytes: 0 %
Lymphocytes Absolute: 1.1 x10E3/uL (ref 0.7–3.1)
Lymphs: 27 %
MCH: 29.4 pg (ref 26.6–33.0)
MCHC: 31.8 g/dL (ref 31.5–35.7)
MCV: 93 fL (ref 79–97)
Monocytes Absolute: 0.3 x10E3/uL (ref 0.1–0.9)
Monocytes: 8 %
Neutrophils Absolute: 2.5 x10E3/uL (ref 1.4–7.0)
Neutrophils: 62 %
Platelets: 200 x10E3/uL (ref 150–450)
RBC: 3.77 x10E6/uL (ref 3.77–5.28)
RDW: 13.3 % (ref 11.7–15.4)
WBC: 4 x10E3/uL (ref 3.4–10.8)

## 2024-11-01 LAB — CMP14+EGFR
ALT: 13 IU/L (ref 0–32)
AST: 20 IU/L (ref 0–40)
Albumin: 4.2 g/dL (ref 3.8–4.8)
Alkaline Phosphatase: 61 IU/L (ref 49–135)
BUN/Creatinine Ratio: 23 (ref 12–28)
BUN: 31 mg/dL — ABNORMAL HIGH (ref 8–27)
Bilirubin Total: 0.6 mg/dL (ref 0.0–1.2)
CO2: 22 mmol/L (ref 20–29)
Calcium: 9.9 mg/dL (ref 8.7–10.3)
Chloride: 105 mmol/L (ref 96–106)
Creatinine, Ser: 1.37 mg/dL — ABNORMAL HIGH (ref 0.57–1.00)
Globulin, Total: 2.8 g/dL (ref 1.5–4.5)
Glucose: 114 mg/dL — ABNORMAL HIGH (ref 70–99)
Potassium: 4.9 mmol/L (ref 3.5–5.2)
Sodium: 141 mmol/L (ref 134–144)
Total Protein: 7 g/dL (ref 6.0–8.5)
eGFR: 40 mL/min/1.73 — ABNORMAL LOW (ref >59)

## 2024-11-01 LAB — THYROID ANTIBODIES (THYROPEROXIDASE & THYROGLOBULIN)
Thyroglobulin Antibody: <1 [IU]/mL (ref 0.0–0.9)
Thyroperoxidase Ab SerPl-aCnc: 11 [IU]/mL (ref 0–34)

## 2024-11-01 LAB — METANEPHRINES, PLASMA
Metanephrine, Free: 37.9 pg/mL (ref 0.0–88.0)
Normetanephrine, Free: 103.2 pg/mL (ref 0.0–285.2)

## 2024-11-01 LAB — TRYPTASE: Tryptase: 7.1 ug/L (ref 2.2–13.2)

## 2024-11-01 LAB — SEDIMENTATION RATE: Sed Rate: 40 mm/h (ref 0–40)

## 2024-11-01 LAB — C-REACTIVE PROTEIN: CRP: <1 mg/L (ref 0–10)

## 2024-11-01 LAB — CHRONIC URTICARIA PD-BAT: Pooled Donor- BAT CU: 4.4 % (ref 0.00–10.60)

## 2024-11-01 LAB — ANTINUCLEAR ANTIBODIES, IFA: ANA Titer 1: NEGATIVE

## 2024-11-03 NOTE — Progress Notes (Incomplete)
 Cardiology Office Note:   Date:  11/03/2024  ID:  [name], DOB July 19, 1951, MRN [id number] PCP: Wendolyn Jenkins Jansky, MD  [facility] Providers Cardiologist:  Georganna Archer, MD { Chief Complaint: No chief complaint on file.     History of Present Illness:   [name] is a 73 y.o. female with a PMH of nonobstructive CAD, elevated CAC, HFpEF, CAS, HTN, HLD, DM2, CKD, hypothyroidism and dementia who presents for follow up.     Past Medical History:  Diagnosis Date   Allergy    Anemia    Arthritis    Asthma    Dianosed years ago-no meds at this time   Cancer [facility]) 2021   endometrial    Cataract    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 name of MD    Dementia (HCC)    Diabetes mellitus    type 2    Diverticulosis    Dyslipidemia    Dyspnea    with exertion    Dysrhythmia    skips beats   Endometrial cancer (HCC) 2021   Family history of adverse reaction to anesthesia    son stopped breathing during surgery , mother slow to wake up    Fibromyalgia 1980's   Generalized anxiety disorder    GERD (gastroesophageal reflux disease)    occ- will use Tums and Prolosec  prn    Heart murmur    Hyperlipemia    Hypertension    not on blood pressure meds due to allergies - last dose of meds- months ago    Hypothyroidism    Hypothyroidism (acquired)    Internal hemorrhoids    Irritable bowel syndrome 1980's   Liver cancer (HCC)    Major depressive disorder    Migraine headaches 1980's   On meds-well controlled   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   Morbid obesity (HCC)    Pernicious anemia    Pneumonia 2009   Several times over the past several years   PONV (postoperative nausea and vomiting)    was told by neurologist due to calcificaton in brain not to be put to sleep, N/V during Op and Recovery    Recurrent  upper respiratory infection (URI)    Sleep apnea    no cpap   Subarachnoid hemorrhage (HCC) 2010   Vaginal polyp    benign per pt     Studies Reviewed:    EKG: ***       Cardiac Studies & Procedures   ______________________________________________________________________________________________     ECHOCARDIOGRAM  ECHOCARDIOGRAM COMPLETE 10/28/2024  Narrative ECHOCARDIOGRAM REPORT    Patient Name:   [name] Date of Exam: 10/28/2024 Medical Rec #:  [id number]         Height:       60.0 in Accession #:    [id number]        Weight:       188.0 lb Date of Birth:  [birth year]        BSA:          1.818 m Patient Age:    73 years          BP:           141/77 mmHg Patient Gender: F  HR:           89 bpm. Exam Location:  [address]  Procedure: 2D Echo, Color Doppler and Cardiac Doppler (Both Spectral and Color Flow Doppler were utilized during procedure).  Indications:    Coronary artery disease involving native coronary artery of native heart without angina pectoris [I25.10 (ICD-10-CM)]; Bilateral carotid artery stenosis [I65.23 (ICD-10-CM)]; Primary hypertension [I10 (ICD-10-CM)]; Hyperlipidemia LDL goal <70 [E78.5 (ICD-10-CM)]; Type 2 diabetes mellitus with stage 3a chronic kidney disease, without long-term current use of insulin  (HCC) [E11.22, N18.31 (ICD-10-CM)]  History:        Patient has prior history of Echocardiogram examinations, most recent 03/12/2023. CAD; Risk Factors:Hypertension, Diabetes and Dyslipidemia.  Sonographer:    Rosaline Fujisawa MHA, RDMS, RVT, RDCS Referring Phys: [id number] EMILY C MONGE   Sonographer Comments: Image acquisition challenging due to patient body habitus. IMPRESSIONS   1. Left ventricular ejection fraction, by estimation, is 60 to 65%. The left ventricle has normal function. The left ventricle has no regional wall motion abnormalities. There is mild asymmetric left ventricular hypertrophy of the  inferior segment. Left ventricular diastolic parameters are indeterminate. 2. Right ventricular systolic function is normal. The right ventricular size is mildly enlarged. There is mildly elevated pulmonary artery systolic pressure. 3. Left atrial size was mildly dilated. 4. Right atrial size was severely dilated. 5. A small pericardial effusion is present. The pericardial effusion is circumferential. 6. The mitral valve is degenerative. Mild to moderate mitral valve regurgitation. Mild mitral stenosis. Severe mitral annular calcification. 7. Tricuspid valve regurgitation is moderate. 8. The aortic valve is normal in structure. Aortic valve regurgitation is not visualized. Aortic valve sclerosis/calcification is present, without any evidence of aortic stenosis. 9. The inferior vena cava is normal in size with greater than 50% respiratory variability, suggesting right atrial pressure of 3 mmHg.  FINDINGS Left Ventricle: Left ventricular ejection fraction, by estimation, is 60 to 65%. The left ventricle has normal function. The left ventricle has no regional wall motion abnormalities. The left ventricular internal cavity size was normal in size. There is mild asymmetric left ventricular hypertrophy of the inferior segment. Left ventricular diastolic parameters are indeterminate.  Right Ventricle: The right ventricular size is mildly enlarged. No increase in right ventricular wall thickness. Right ventricular systolic function is normal. There is mildly elevated pulmonary artery systolic pressure. The tricuspid regurgitant velocity is 2.86 m/s, and with an assumed right atrial pressure of 8 mmHg, the estimated right ventricular systolic pressure is 40.7 mmHg.  Left Atrium: Left atrial size was mildly dilated.  Right Atrium: Right atrial size was severely dilated.  Pericardium: A small pericardial effusion is present. The pericardial effusion is circumferential. Presence of epicardial fat  layer.  Mitral Valve: The mitral valve is degenerative in appearance. Severe mitral annular calcification. Mild to moderate mitral valve regurgitation. Mild mitral valve stenosis. MV peak gradient, 11.1 mmHg. The mean mitral valve gradient is 5.0 mmHg.  Tricuspid Valve: The tricuspid valve is normal in structure. Tricuspid valve regurgitation is moderate . No evidence of tricuspid stenosis.  Aortic Valve: The aortic valve is normal in structure. Aortic valve regurgitation is not visualized. Aortic valve sclerosis/calcification is present, without any evidence of aortic stenosis. Aortic valve mean gradient measures 6.0 mmHg. Aortic valve peak gradient measures 10.9 mmHg. Aortic valve area, by VTI measures 1.36 cm.  Pulmonic Valve: The pulmonic valve was not well visualized. Pulmonic valve regurgitation is trivial. No evidence of pulmonic stenosis.  Aorta: The aortic root is normal in size  and structure.  Venous: The inferior vena cava is normal in size with greater than 50% respiratory variability, suggesting right atrial pressure of 3 mmHg.  IAS/Shunts: No atrial level shunt detected by color flow Doppler.   LEFT VENTRICLE PLAX 2D LVIDd:         4.50 cm LVIDs:         3.53 cm LV PW:         1.25 cm LV IVS:        0.87 cm LVOT diam:     1.55 cm LV SV:         46 LV SV Index:   25 LVOT Area:     1.89 cm   RIGHT VENTRICLE             IVC RV Basal diam:  4.11 cm     IVC diam: 1.80 cm RV Mid diam:    3.29 cm RV S prime:     16.80 cm/s TAPSE (M-mode): 1.7 cm  LEFT ATRIUM             Index        RIGHT ATRIUM           Index LA diam:        5.20 cm 2.86 cm/m   RA Area:     24.20 cm LA Vol (A2C):   71.6 ml 39.38 ml/m  RA Volume:   80.90 ml  44.50 ml/m LA Vol (A4C):   66.2 ml 36.41 ml/m LA Biplane Vol: 74.6 ml 41.03 ml/m AORTIC VALVE AV Area (Vmax):    1.29 cm AV Area (Vmean):   1.32 cm AV Area (VTI):     1.36 cm AV Vmax:           165.00 cm/s AV Vmean:           111.000 cm/s AV VTI:            0.338 m AV Peak Grad:      10.9 mmHg AV Mean Grad:      6.0 mmHg LVOT Vmax:         113.00 cm/s LVOT Vmean:        77.500 cm/s LVOT VTI:          0.244 m LVOT/AV VTI ratio: 0.72  AORTA Ao Root diam: 2.76 cm Ao Asc diam:  3.56 cm  MITRAL VALVE                TRICUSPID VALVE MV Area (PHT): 3.65 cm     TR Peak grad:   32.7 mmHg MV Area VTI:   1.32 cm     TR Vmax:        286.00 cm/s MV Peak grad:  11.1 mmHg MV Mean grad:  5.0 mmHg     SHUNTS MV Vmax:       1.66 m/s     Systemic VTI:  0.24 m MV Vmean:      105.0 cm/s   Systemic Diam: 1.55 cm MV Decel Time: 208 msec MR Peak grad: 87.6 mmHg MR Vmax:      468.00 cm/s MV E velocity: 145.00 cm/s MV A velocity: 167.00 cm/s MV E/A ratio:  0.87  Kardie Tobb DO Electronically signed by Dub Huntsman DO Signature Date/Time: 10/28/2024/1:42:03 PM    Final    MONITORS  LONG TERM MONITOR (3-14 DAYS) 01/07/2020  Narrative  NSR with occasional PACs and PVCs.  Rare short bursts of tachycardia lasting only seconds.  No  sustained arrhythmias.  No atrial fibrillation.  Continue medical therapy.   CT SCANS  CT CORONARY MORPH W/CTA COR W/SCORE 05/02/2020  Addendum 05/03/2020 12:53 PM ADDENDUM REPORT: 05/03/2020 12:50  HISTORY: Chest pain, nonspecific chest pain  EXAM: Cardiac/Coronary CT  TECHNIQUE: The patient was scanned on a [employer].  PROTOCOL: A 120 kV prospective scan was triggered in the descending thoracic aorta at 111 HU's. Axial non-contrast 3 mm slices were carried out through the heart. The data set was analyzed on a dedicated work station and scored using the Agatson method. Gantry rotation speed was 250 msecs and collimation was 0.6 mm. Beta blockade and 0.8 mg of sl NTG was given. The 3D data set was reconstructed retrospectively in 12 sections; section 11/12 represented best diastolic phase for most assessments. Diastolic phases were analyzed on a dedicated  work station using MPR, MIP and VRT modes. The patient received 80mL OMNIPAQUE  IOHEXOL  350 MG/ML SOLN of contrast.  FINDINGS: Coronary calcium  score: The patient's coronary artery calcium  score is 879, which places the patient in the 96 percentile.  Coronary arteries: Normal coronary origins.  Right dominance.  Right Coronary Artery: Large caliber vessel, gives rise to PDA and PLB. Proximal portion has scattered calcified plaque with circumferential calcification and 25-49% stenosis. Mid RCA has scattered calcified plaque with maximum 25-49% stenosis. Distal RCA has scattered calcified plaque with maximum 1-24% stenosis.  Left Main Coronary Artery: Normal caliber vessel. No significant plaque or stenosis.  Left Anterior Descending Coronary Artery: Normal caliber vessel, tapers prior to apex. Mild scattered calcified plaque throughout proximal portion. Most significant stenosis is 25-49%. 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 LAD gives rise to 1 small diagonal branch with calcified plaque at ostium.  Left Circumflex Artery: Normal caliber vessel, tapers at mid lateral wall. Mild calcified plaque in proximal portion with 25-49% stenosis. Gives rise to 1 OM branch, no significant stenosis.  Aorta: Normal size, 34 mm at the mid ascending aorta (level of the PA bifurcation) measured double oblique. Mild scattered calcifications. No dissection.  Aortic Valve: No significant calcifications. Trileaflet.  Other findings:  Normal pulmonary vein drainage into the left atrium.  Normal left atrial appendage without a thrombus.  Normal size of the pulmonary artery.  Dense mitral annular calcification.  Calcified bronchi.  PFO present with evidence of left to right flow.  IMPRESSION: 1.  Three vessel mild non-obstructive CAD, CADRADS = 2.  2. Coronary calcium  score of 879. This was 96th percentile for age and sex matched control.  3.  Normal coronary origin with right  dominance.  4.  PFO present with evidence of left to right flow.   Electronically Signed By: Shelda Bruckner M.D. On: 05/03/2020 12:50  Narrative EXAM: OVER-READ INTERPRETATION  CT CHEST  The following report is an over-read performed by radiologist Dr. Franky Crease of [facility] on 05/02/2020. This over-read does not include interpretation of cardiac or coronary anatomy or pathology. The coronary CTA interpretation by the cardiologist is attached.  COMPARISON:  None.  FINDINGS: Vascular: Heart is normal size. Aorta normal caliber. Scattered aortic calcifications. Small pericardial effusion.  Mediastinum/Nodes: No adenopathy.  Lungs/Pleura: No confluent opacities or effusions.  Upper Abdomen: Imaging into the upper abdomen shows no acute findings.  Musculoskeletal: Chest wall soft tissues are unremarkable. No acute bony abnormality.  IMPRESSION: No acute extra cardiac abnormality.  Aortic atherosclerosis.  Small pericardial effusion.  Electronically Signed: By: Franky Crease M.D. On: 05/02/2020 17:26  ______________________________________________________________________________________________      Risk Assessment/Calculations:   {Does this patient have ATRIAL FIBRILLATION?[phone]} No BP recorded.  {Refresh Note OR Click here to enter BP  :1}***   STOP-Bang Score:     { Consider Dx Sleep Disordered Breathing or Sleep Apnea  ICD G47.33          :1}     Physical Exam:     VS:  There were no vitals taken for this visit. ***    Wt Readings from Last 3 Encounters:  10/22/24 188 lb (85.3 kg)  10/22/24 188 lb 8 oz (85.5 kg)  09/23/24 190 lb 3.2 oz (86.3 kg)     GEN: Well nourished, well developed, in no acute distress NECK: No JVD; No carotid bruits CARDIAC: ***RRR, no murmurs, rubs, gallops RESPIRATORY:  Clear to auscultation without rales, wheezing or rhonchi  ABDOMEN: Soft, non-tender, non-distended, normal bowel  sounds EXTREMITIES:  Warm and well perfused, no edema; No deformity, 2+ radial pulses PSYCH: Normal mood and affect   Assessment & Plan       {Are you ordering a CV Procedure (e.g. stress test, cath, DCCV, TEE, etc)?   Press F2        :[id number]}   This note was written with the assistance of a dictation microphone or AI dictation software. Please excuse any typos or grammatical errors.   Signed, Georganna Archer, MD 11/03/2024 10:00 PM    [facility]

## 2024-11-03 NOTE — Progress Notes (Unsigned)
 Patient ID: [name]                 DOB: 1951-12-11                      MRN: [id number]     HPI: [name] is a 73 y.o. female referred by NP, Monge to pharmacy clinic for HFpEF medication management. PMH is significant for T2DM, nonobstructive CAD (CAC score 879, 96th percentile), HTN, HLD, CKD IIIb. Most recent LVEF 60 -65% with indeterminant diastolic paramaters and LV hypertrophy on 10/28/2024. ECHO from 01/30/23 showed evidence of Grade I diastolic dysfunction.   Per Dr. Daneen note, patient at last visit reported increased SOB, bilateral lower extremity edema, weight gain, and mild orthopnea.   Discussion with patient today included the following: cardiac medication indications, introduction to GDMT clinic, reasoning behind medication titration, importance of medication adherence, and patient engagement. Also discussed with patient difference between medications or symptomatic relief and those with long-term cardiovascular benfits. Symptomatically, patient has been experiencing shortness of breath and lower extremity edema. Limited mobility due to use of cane. She checks her weight and notices a decrease with more consistent use of torsemide .  Patient has numerous intolerances to medications, including a chills to loop diuretics, headache with Jardiance, and unknown reactions to spironolactone and sulfa antibiotics. Takes toresemide 40 mg on most days, though reports extreme chills, pain, and trouble breathing with use. Patient does note it works for increasing urine output and causes weight loss. Since patient has documented family history to sulfa antibiotics and history of reactions to sulfa- containing medications, will consider use of ethacrynic acid in place of torsemide .Willing to trial Farxiga for cardiovascular benefit.   Patient initially presented to cardiology after not receiving follow-up visit for several years. Patient inquired about recent ECHO results. Read result  interpretation from Dr. Daneen to patient. Patient does not use MyChart, though previous providers have attempted to message. Will only call in the future.   Patient sees many doctors and reports everything has been 'downhill' since her recovery from cancer. Also voices frustration that many of these doctors are not on the same page, and that she feels she has been treated differently since she has aged.   Patient is under high amount of stress with daily life. Relies on cane to walk, reports having macular degeneration, and is helping care for daughter who is suffering from schizoaffective disorder. On top of this, patient reports having more memory issues over past 6 months.   Current CHF meds: Torsemide  20 mg every other day or as needed Previously tried: Jardiance (headache), spironolactone (unknown)  BP goal: <130/80  Family History: Schizoaffective disorder in daughter    Wt Readings from Last 3 Encounters:  10/22/24 188 lb (85.3 kg)  10/22/24 188 lb 8 oz (85.5 kg)  09/23/24 190 lb 3.2 oz (86.3 kg)   BP Readings from Last 3 Encounters:  10/22/24 (!) 140/90  10/22/24 112/76  10/05/24 (!) 159/83   Pulse Readings from Last 3 Encounters:  10/22/24 83  10/22/24 76  10/05/24 85    Renal function: Estimated Creatinine Clearance: 34.4 mL/min (A) (by C-G formula based on SCr of 1.37 mg/dL (H)).  Past Medical History:  Diagnosis Date   Allergy    Anemia    Arthritis    Asthma    Dianosed years ago-no meds at this time   Cancer [facility]) 2021   endometrial    Cataract  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 name of MD    Dementia (HCC)    Diabetes mellitus    type 2    Diverticulosis    Dyslipidemia    Dyspnea    with exertion    Dysrhythmia    skips beats   Endometrial cancer (HCC) 2021   Family history of adverse reaction to  anesthesia    son stopped breathing during surgery , mother slow to wake up    Fibromyalgia 1980's   Generalized anxiety disorder    GERD (gastroesophageal reflux disease)    occ- will use Tums and Prolosec  prn    Heart murmur    Hyperlipemia    Hypertension    not on blood pressure meds due to allergies - last dose of meds- months ago    Hypothyroidism    Hypothyroidism (acquired)    Internal hemorrhoids    Irritable bowel syndrome 1980's   Liver cancer (HCC)    Major depressive disorder    Migraine headaches 1980's   On meds-well controlled   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 Swallow whole.     Cholecalciferol  (VITAMIN D3) 25 MCG (1000 UT) capsule Take 1,000 Units by mouth daily.     ESTRING  7.5 MCG/24HR vaginal ring Place 1 each vaginally every 3 (three) months. 1 each 3   Evolocumab  (REPATHA  SURECLICK) 140 MG/ML SOAJ INJECT 1 PEN SUBCUTANEOUSLY EVERY 14 DAYS 6 mL 3   glucose blood (ACCU-CHEK GUIDE) test strip Use to test blood sugar 3 times daily 100 each 2   levothyroxine  (SYNTHROID ) 50 MCG tablet Take 1 tablet (50 mcg total) by mouth daily before breakfast. 90 tablet 3   Multiple Vitamin (MULTIVITAMIN WITH MINERALS) TABS tablet Take 1 tablet by mouth daily. 30 tablet 0   potassium chloride  SA (KLOR-CON  M) 20 MEQ tablet Take 1 tablet (20 mEq total) by mouth 2 (two) times daily. 180 tablet 0   torsemide  (DEMADEX ) 20 MG tablet TAKE 1  TABLET BY MOUTH ONCE DAILY 90 tablet 1   No current facility-administered medications on file prior to visit.    Allergies  Allergen Reactions   Amoxicillin -Pot Clavulanate Other (See Comments)    Caused jaundice  Jaundice   Atenolol Other (See Comments)    Stomach problems, chills   Atorvastatin Other (See Comments)    Retained fluid   Canagliflozin Other (See Comments)    ALL DIABETIC MEDS PER PT   abdominal pain, headache   Losartan Potassium Other (See Comments)    chills, HA, stomach pain   Acarbose Nausea Only and Other (See Comments)     headache,nausea   Cinnamon Other (See Comments)  Stomach pain   Empagliflozin Other (See Comments)     headaches   Glimepiride Other (See Comments)    ALL DIABETIC MEDS PER PT  abdominal pain, headache   Metformin And Related Nausea And Vomiting and Other (See Comments)    Patient states that she has severe chills, headache and cramping additionally. Says blood sugar is uncontrolled because of this   Other Other (See Comments)    ALL DIABETIC MEDS PER PT    Pioglitazone Other (See Comments)    ALL DIABETIC MEDS PER PT = peripheral edema   Pravastatin Other (See Comments)    Retained fluid   Sitagliptin Other (See Comments)    ALL DIABETIC MEDS PER PT  Other reaction(s): headache   Spironolactone-Hctz Other (See Comments)   Statins Other (See Comments)    Myalgia   Sulfamethoxazole Other (See Comments)    ALL DIABETIC MEDS PER PT    Tramadol  Hcl Itching   Atorvastatin Calcium  Other (See Comments)     retain fld   Crestor [Rosuvastatin] Other (See Comments)   Rosuvastatin Calcium     Torsemide  Nausea Only    Gi upset, chills, abd pain,bloating   Bumex  [Bumetanide ] Rash and Other (See Comments)    Chills, rash, headaches, stomach issues also   Lasix  [Furosemide ] Rash and Other (See Comments)    Sugars increased   Sulfa Antibiotics Rash and Other (See Comments)    Mother has told patient in the past not to take, she cannot  recall there reaction     Assessment/Plan:  1. CHF -  Problem  (Hfpef) Heart Failure With Preserved Ejection Fraction (Hcc)   Patient has numerous intolerances to medications, including a chills to loop diuretics, headache with Jardiance, and unknown reactions to spironolactone and sulfa antibiotics.  Takes toresemide 40 mg on most days, though reports extreme chills, pain, and trouble breathing with use. Patient does note it works for increasing urine output and causes weight loss.  Since patient has documented family history to sulfa antibiotics and history of reactions to sulfa- containing medications, will consider use of ethacrynic acid in place of torsemide . Willing to trial Farxiga for cardiovascular benefit.     (HFpEF) heart failure with preserved ejection fraction (HCC)  Assessment:  - Symptomatic heart failure with notable fluid overload per patient complaints.  - Heart failure is unaddressed since patient is not on any GDMT and is only taking torsemide  - Difficult to treat due to prior intolerance to Jardiance and adverse effects of torsemide   Plan:  - Start dapagliflozin (Farxiga) 5 mg once daily today - Will consider switch to ethacrynic acid from torsemide  in future due to possible severe sulfa- allergy.  - Follow-up BMP on December 3rd  - Next visit with pharmacy on January 8th    Thank you   Daejon Lich, [place].D Candidate   Robbi Blanch, [address]D [place] [facility]. [facility] [address], [place], [place] [address] Phone: [phone]; Fax: [phone]

## 2024-11-04 ENCOUNTER — Other Ambulatory Visit (HOSPITAL_COMMUNITY): Payer: Self-pay

## 2024-11-04 ENCOUNTER — Encounter: Payer: Self-pay | Admitting: Pharmacist

## 2024-11-04 ENCOUNTER — Ambulatory Visit: Admitting: Student in an Organized Health Care Education/Training Program

## 2024-11-04 ENCOUNTER — Ambulatory Visit: Attending: Cardiovascular Disease | Admitting: Pharmacist

## 2024-11-04 DIAGNOSIS — I5032 Chronic diastolic (congestive) heart failure: Secondary | ICD-10-CM | POA: Diagnosis not present

## 2024-11-04 DIAGNOSIS — R0602 Shortness of breath: Secondary | ICD-10-CM

## 2024-11-04 DIAGNOSIS — I5030 Unspecified diastolic (congestive) heart failure: Secondary | ICD-10-CM | POA: Insufficient documentation

## 2024-11-04 MED ORDER — DAPAGLIFLOZIN PROPANEDIOL 5 MG PO TABS
5.0000 mg | ORAL_TABLET | Freq: Every day | ORAL | 11 refills | Status: DC
  Filled 2024-11-04: qty 30, 30d supply, fill #0
  Filled 2024-12-18 (×2): qty 30, 30d supply, fill #1

## 2024-11-04 NOTE — Patient Instructions (Signed)
 It was nice seeing you today!   Medication changes:  - Start taking Farxiga (dapagliflozin) 5 mg once daily. This is the smallest dose, and it will benefit your heart and kidney function. It costs $0 through your insurance and has been sent to our downstairs pharmacy.  - Continue taking torsemide  40 mg once daily as needed for fluid build-up and as tolerated.  - I will be out of office for most of December. Please call general line [phone] for assistance then.    You will go to [facility] on Wednesday, December 3rd for a follow-up blood draw.   Your next appointment is on Thursday, January 8th at 9:45 AM.

## 2024-11-04 NOTE — Assessment & Plan Note (Signed)
 Assessment:  - Symptomatic heart failure with notable fluid overload per patient complaints.  - Heart failure is unaddressed since patient is not on any GDMT and is only taking torsemide  - Difficult to treat due to prior intolerance to Jardiance and adverse effects of torsemide   Plan:  - Start dapagliflozin (Farxiga) 5 mg once daily today - Will consider switch to ethacrynic acid from torsemide  in future due to possible severe sulfa- allergy.  - Follow-up BMP on December 3rd  - Next visit with pharmacy on January 8th

## 2024-11-06 ENCOUNTER — Ambulatory Visit: Payer: Self-pay | Admitting: Allergy & Immunology

## 2024-11-06 ENCOUNTER — Other Ambulatory Visit: Payer: Self-pay

## 2024-11-06 DIAGNOSIS — I3139 Other pericardial effusion (noninflammatory): Secondary | ICD-10-CM

## 2024-11-08 LAB — VANILLYLMANDELIC ACID, 24-HR U
VMA, 24H Ur Adult: 4.1 mg/(24.h) (ref 0.0–7.5)
VMA, Urine: 4.3 mg/L

## 2024-11-08 LAB — 5 HIAA, QUANTITATIVE, URINE, 24 HOUR
5-HIAA, Ur: 3.5 mg/L
5-HIAA,Quant.,24 Hr Urine: 3.3 mg/(24.h) (ref 0.0–14.9)

## 2024-11-08 LAB — METANEPHRINES, PHEOCHROMOCYT
Creatinine, Random U: 85.9 mg/dL
Metaneph Total, Ur: 13 ug/L
Metaneph/Creat Ratio: 0.1 ug/mg{creat} (ref 0.0–1.0)
Normetanephrine, Ur: 24 ug/L

## 2024-11-09 ENCOUNTER — Telehealth: Payer: Self-pay | Admitting: Student in an Organized Health Care Education/Training Program

## 2024-11-09 ENCOUNTER — Encounter: Payer: Self-pay | Admitting: Cardiovascular Disease

## 2024-11-09 ENCOUNTER — Other Ambulatory Visit: Payer: Self-pay | Admitting: Family Medicine

## 2024-11-09 ENCOUNTER — Other Ambulatory Visit: Payer: Self-pay

## 2024-11-09 DIAGNOSIS — I25709 Atherosclerosis of coronary artery bypass graft(s), unspecified, with unspecified angina pectoris: Secondary | ICD-10-CM

## 2024-11-09 DIAGNOSIS — I3139 Other pericardial effusion (noninflammatory): Secondary | ICD-10-CM

## 2024-11-09 MED ORDER — REPATHA SURECLICK 140 MG/ML ~~LOC~~ SOAJ: SUBCUTANEOUS | 3 refills | Status: DC

## 2024-11-09 MED ORDER — REPATHA SURECLICK 140 MG/ML ~~LOC~~ SOAJ: SUBCUTANEOUS | 3 refills | Status: AC

## 2024-11-09 NOTE — Telephone Encounter (Signed)
 error

## 2024-11-09 NOTE — Telephone Encounter (Signed)
 PT called back to discuss lab results, was told she should hear something by around 11/20. I advised that Dr Iva did have some MyChart messages (PT advised does not utilize due to age/technological issues), so read out results for her.  She thanked for information, and I did advise that Dr Iva was waiting on 2 more test results from [facility] to be completed.   PT requested a call back, not message, when obtained, thanked

## 2024-11-09 NOTE — Telephone Encounter (Signed)
 Confirmed with pt. Spoke with Bernarda Ada, CMA. All questions answered and someone would be in touch with pt to schedule ECHO.

## 2024-11-09 NOTE — Telephone Encounter (Signed)
  Patient is returning call regarding her echo results

## 2024-11-09 NOTE — Telephone Encounter (Signed)
 Patient returned staff call regarding results.

## 2024-11-10 ENCOUNTER — Encounter: Payer: Self-pay | Admitting: Podiatry

## 2024-11-10 ENCOUNTER — Ambulatory Visit: Admitting: Podiatry

## 2024-11-10 DIAGNOSIS — N1831 Chronic kidney disease, stage 3a: Secondary | ICD-10-CM

## 2024-11-10 DIAGNOSIS — M79674 Pain in right toe(s): Secondary | ICD-10-CM

## 2024-11-10 DIAGNOSIS — B351 Tinea unguium: Secondary | ICD-10-CM | POA: Diagnosis not present

## 2024-11-10 DIAGNOSIS — E1122 Type 2 diabetes mellitus with diabetic chronic kidney disease: Secondary | ICD-10-CM

## 2024-11-10 DIAGNOSIS — M79675 Pain in left toe(s): Secondary | ICD-10-CM

## 2024-11-15 NOTE — Progress Notes (Signed)
 Subjective:  Patient ID: [name], female    DOB: [birth year],  MRN: [id number]  [name] presents to clinic today for at risk foot care. Pt has h/o NIDDM with chronic kidney disease and painful thick toenails that are difficult to trim. Pain interferes with ambulation. Aggravating factors include wearing enclosed shoe gear. Pain is relieved with periodic professional debridement.  Chief Complaint  Patient presents with   RFC    RFC. Diabetic. A1c 6.0 Isabella Jenkins Jansky, MD (PCP), 10/22/24    New problem(s): None.   PCP is Isabella Jenkins Jansky, MD.  Allergies  Allergen Reactions   Amoxicillin -Pot Clavulanate Other (See Comments)    Caused jaundice  Jaundice   Atenolol Other (See Comments)    Stomach problems, chills   Atorvastatin Other (See Comments)    Retained fluid   Canagliflozin Other (See Comments)    ALL DIABETIC MEDS PER PT   abdominal pain, headache   Losartan Potassium Other (See Comments)    chills, HA, stomach pain   Acarbose Nausea Only and Other (See Comments)     headache,nausea   Cinnamon Other (See Comments)    Stomach pain   Empagliflozin Other (See Comments)     headaches   Glimepiride Other (See Comments)    ALL DIABETIC MEDS PER PT  abdominal pain, headache   Metformin And Related Nausea And Vomiting and Other (See Comments)    Patient states that she has severe chills, headache and cramping additionally. Says blood sugar is uncontrolled because of this   Other Other (See Comments)    ALL DIABETIC MEDS PER PT    Pioglitazone Other (See Comments)    ALL DIABETIC MEDS PER PT = peripheral edema   Pravastatin Other (See Comments)    Retained fluid   Sitagliptin Other (See Comments)    ALL DIABETIC MEDS PER PT  Other reaction(s): headache   Spironolactone-Hctz Other (See Comments)   Statins Other (See Comments)    Myalgia   Sulfamethoxazole Other (See Comments)    ALL DIABETIC MEDS PER PT    Tramadol  Hcl Itching   Atorvastatin  Calcium  Other (See Comments)     retain fld   Crestor [Rosuvastatin] Other (See Comments)   Rosuvastatin Calcium     Torsemide  Nausea Only    Gi upset, chills, abd pain,bloating   Bumex  [Bumetanide ] Rash and Other (See Comments)    Chills, rash, headaches, stomach issues also   Lasix  [Furosemide ] Rash and Other (See Comments)    Sugars increased   Sulfa Antibiotics Rash and Other (See Comments)    Mother has told patient in the past not to take, she cannot recall there reaction    Review of Systems: Negative except as noted in the HPI.  Objective: No changes noted in today's physical examination. There were no vitals filed for this visit. [name] is a pleasant 73 y.o. female WD, WN in NAD. AAO x 3.  Vascular Examination: Vascular status intact b/l with palpable pedal pulses. CFT immediate b/l. No edema. No pain with calf compression b/l. Skin temperature gradient WNL b/l. No varicosities noted. No ischemia or gangrene noted b/l LE. No cyanosis or clubbing noted b/l LE.  Neurological Examination: Sensation grossly intact b/l with 10 gram monofilament.   Dermatological Examination: Pedal skin with normal turgor, texture and tone b/l. Toenails 1-5 b/l thick, discolored, elongated with subungual debris and pain on dorsal palpation. No hyperkeratotic lesions noted b/l.   Musculoskeletal Examination: Muscle strength 5/5 to  b/l LE. HAV with bunion deformity noted b/l LE. Utilizes cane for ambulation assistance.  Radiographs: None  Assessment/Plan: 1. Pain due to onychomycosis of toenails of both feet   2. Type 2 diabetes mellitus with stage 3a chronic kidney disease, without long-term current use of insulin  [facility])   Consent given for treatment. Patient examined. All patient's and/or POA's questions/concerns addressed on today's visit. Toenails 1-5 b/l debrided in length and girth without incident. Continue foot and shoe inspections daily. Monitor blood glucose per  PCP/Endocrinologist's recommendations. Continue soft, supportive shoe gear daily. Report any pedal injuries to medical professional. Call office if there are any questions/concerns. -Patient/POA to call should there be question/concern in the interim.   Return in about 3 months (around 02/10/2025).  [name], DPM      [facility] LOCATION: [address], [place] [address]                   Office [phone]   [facility] LOCATION: [address] [place], [place] [address] Office [phone]

## 2024-11-18 ENCOUNTER — Telehealth: Payer: Self-pay | Admitting: Pharmacist

## 2024-11-18 ENCOUNTER — Telehealth: Payer: Self-pay

## 2024-11-18 NOTE — Telephone Encounter (Signed)
 Called to follow up on Farxiga . Patient reports that her nephrologist, Dr. Dennise, advised her to hold the medication until her appointment on January 05, 2025, as some kidney test results are still pending.

## 2024-11-18 NOTE — Telephone Encounter (Signed)
 Transition Care Management Follow-up Telephone Call Date of discharge and from where: 11/15/24 [facility] ED How have you been since you were released from the hospital? Worse Any questions or concerns? No  Items Reviewed: Did the pt receive and understand the discharge instructions provided? Yes  Medications obtained and verified? No  Other? No  Any new allergies since your discharge? No  Dietary orders reviewed? No Do you have support at home? Yes   Home Care and Equipment/Supplies: Were home health services ordered? not applicable If so, what is the name of the agency?   Has the agency set up a time to come to the patient's home? not applicable Were any new equipment or medical supplies ordered?  No What is the name of the medical supply agency?  Were you able to get the supplies/equipment? not applicable Do you have any questions related to the use of the equipment or supplies? No  Functional Questionnaire: (I = Independent and D = Dependent) ADLs: I  Bathing/Dressing- I  Meal Prep- I  Eating- I  Maintaining continence- I  Transferring/Ambulation- I  Managing Meds- I  Follow up appointments reviewed:  PCP Hospital f/u appt confirmed? Yes  Scheduled to see Jenkins Carrel on 12/04/24 . Specialist Hospital f/u appt confirmed? Yes  Scheduled to see Dr Arlyss King on 11/18/24 Are transportation arrangements needed? No  If their condition worsens, is the pt aware to call PCP or go to the Emergency Dept.? Yes Was the patient provided with contact information for the PCP's office or ED? Yes Was to pt encouraged to call back with questions or concerns? Yes

## 2024-11-21 LAB — CATECHOLAMINES,UR.,FREE,RANDOM
Creatinine, Random U: 71.6 mg/dL
Dopamine, Rand Ur: 116 ug/L
Dopamine: 162 ug/g{creat} (ref 0–348)
Epinephrine, Rand Ur: <3 ug/L
Epinephrine: <4 ug/g{creat} (ref 0–19)
Norepinephrine, Rand Ur: 46 ug/L
Norepinephrine: 64 ug/g{creat} (ref 0–111)

## 2024-11-23 ENCOUNTER — Ambulatory Visit: Admitting: Obstetrics and Gynecology

## 2024-11-23 ENCOUNTER — Other Ambulatory Visit: Payer: Self-pay

## 2024-11-23 DIAGNOSIS — R600 Localized edema: Secondary | ICD-10-CM

## 2024-11-23 MED ORDER — TORSEMIDE 20 MG PO TABS: 20.0000 mg | ORAL_TABLET | Freq: Every day | ORAL | 1 refills | Status: AC

## 2024-11-25 ENCOUNTER — Other Ambulatory Visit: Payer: Self-pay | Admitting: Family Medicine

## 2024-11-25 DIAGNOSIS — R600 Localized edema: Secondary | ICD-10-CM

## 2024-11-26 ENCOUNTER — Ambulatory Visit

## 2024-11-30 NOTE — Patient Instructions (Incomplete)
 1. Allergic reaction -lab work looking for causes of allergic reactions and pheochromocytoma unrrevealing - Continue Claritin (loratadine) 5 mg daily - If they are indeed allergic in nature, this should help.   2 Follow up in months or sooner if needed

## 2024-12-01 ENCOUNTER — Ambulatory Visit: Admitting: Family

## 2024-12-01 NOTE — Progress Notes (Signed)
 She unfortunately did not show up today for her appointment,but thank you

## 2024-12-04 ENCOUNTER — Ambulatory Visit (INDEPENDENT_AMBULATORY_CARE_PROVIDER_SITE_OTHER): Admitting: Family Medicine

## 2024-12-04 ENCOUNTER — Encounter: Payer: Self-pay | Admitting: Family Medicine

## 2024-12-04 ENCOUNTER — Ambulatory Visit: Payer: Self-pay

## 2024-12-04 VITALS — BP 142/70 | HR 97 | Temp 97.0°F | Ht 60.0 in | Wt 186.2 lb

## 2024-12-04 DIAGNOSIS — E538 Deficiency of other specified B group vitamins: Secondary | ICD-10-CM | POA: Diagnosis not present

## 2024-12-04 DIAGNOSIS — R051 Acute cough: Secondary | ICD-10-CM

## 2024-12-04 LAB — POC COVID19 BINAXNOW: SARS Coronavirus 2 Ag: NEGATIVE

## 2024-12-04 LAB — POCT INFLUENZA A/B
Influenza A, POC: NEGATIVE
Influenza B, POC: NEGATIVE

## 2024-12-04 MED ORDER — CYANOCOBALAMIN 1000 MCG/ML IJ SOLN
1000.0000 ug | Freq: Once | INTRAMUSCULAR | Status: AC
  Administered 2024-12-04: 1000 ug via INTRAMUSCULAR

## 2024-12-04 NOTE — Telephone Encounter (Signed)
 Appt today

## 2024-12-04 NOTE — Progress Notes (Signed)
 "  Subjective:     Patient ID: [name], female    DOB: 12-13-1951, 73 y.o.   MRN: [id number]  Chief Complaint  Patient presents with   Cough    Pt has cough, congestion, chills    Discussed the use of AI scribe software for clinical note transcription with the patient, who gave verbal consent to proceed.  History of Present Illness [name] is a 73 year old female who presents with cough and difficulty breathing. Here for reg f/u, but above started  She developed a cough and difficulty breathing starting yesterday. The difficulty breathing is not due to a stuffy nose, although she does have some nasal congestion. Several people at her house, including [name] and her husband, have been very sick and suspect they have COVID. She has had COVID before and is concerned about the possibility of having it again.  She experiences severe chills, particularly in the afternoon, but does not have a fever. She rarely runs a fever but often has chills, which she attributes to her medications. She has a history of kidney issues and is concerned about the impact of potential treatments on her kidneys.  She has been taking more of her diuretic, which she finds unpleasant, and she has been hesitant to take other medications due to side effects. She recalls using inhalers years ago when she had pneumonia but has not used them recently.  She has allergies and has visited an allergy doctor, but she feels the doctor did not provide new insights into her condition. She is allergic to lamb, pecans, cats, birch, and oak.  Her daughter, [name], is not sick, but she is concerned about keeping her away from others who are ill. [name] is currently on Abilify , which she believes is causing adverse effects, including shopping addiction.    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 name of MD    Dementia (HCC)    Diabetes mellitus    type 2    Diverticulosis    Dyslipidemia    Dyspnea    with exertion    Dysrhythmia    skips beats   Endometrial cancer (HCC) 2021   Family history of adverse reaction to anesthesia    son stopped breathing during surgery , mother slow to wake up    Fibromyalgia 1980's   Generalized anxiety disorder    GERD (gastroesophageal reflux disease)    occ- will use Tums and Prolosec  prn    Heart murmur    Hyperlipemia    Hypertension    not on blood pressure meds due to allergies - last dose of meds- months ago    Hypothyroidism    Hypothyroidism (acquired)    Internal hemorrhoids    Irritable bowel syndrome 1980's   Liver cancer (HCC)    Major depressive disorder    Migraine headaches 1980's   On meds-well controlled   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   Morbid obesity (HCC)    Pernicious anemia    Pneumonia 2009   Several times over the past several years   PONV (postoperative nausea and vomiting)    was told by neurologist due to calcificaton in brain not to be put to sleep, N/V during Op and Recovery    Recurrent upper respiratory infection (URI)    Sleep apnea    no cpap   Subarachnoid hemorrhage (HCC) 2010   Vaginal polyp    benign per pt    Past Surgical History:  Procedure Laterality Date   BIOPSY  01/07/2024   Procedure: BIOPSY;  Surgeon: Shila Gustav GAILS, MD;  Location: [facility];  Service: Gastroenterology;;   CATARACT EXTRACTION Right    CHOLECYSTECTOMY  1983   COLONOSCOPY  2008   DB   COLONOSCOPY WITH PROPOFOL  N/A 01/07/2024   Procedure: COLONOSCOPY WITH PROPOFOL ;  Surgeon:  Shila Gustav GAILS, MD;  Location: [facility];  Service: Gastroenterology;  Laterality: N/A;   DILATION AND CURETTAGE OF UTERUS  1972   ESOPHAGOGASTRODUODENOSCOPY (EGD) WITH PROPOFOL  N/A 01/07/2024   Procedure: ESOPHAGOGASTRODUODENOSCOPY (EGD) WITH PROPOFOL ;  Surgeon: Shila Gustav GAILS, MD;  Location: [facility];  Service: Gastroenterology;  Laterality: N/A;   HYSTEROSCOPY WITH D & C  06/29/2011   Procedure: DILATATION AND CURETTAGE (D&C) /HYSTEROSCOPY;  Surgeon: Glenys GORMAN Birk, MD;  Location: [facility];  Service: Gynecology;  Laterality: N/A;   ROBOTIC ASSISTED TOTAL HYSTERECTOMY WITH BILATERAL SALPINGO OOPHERECTOMY N/A 10/27/2020   Procedure: XI ROBOTIC ASSISTED TOTAL HYSTERECTOMY WITH BILATERAL SALPINGO OOPHORECTOMY;  Surgeon: Eloy Herring, MD;  Location: [facility];  Service: Gynecology;  Laterality: N/A;   SENTINEL NODE BIOPSY N/A 10/27/2020   Procedure: SENTINEL NODE BIOPSY;  Surgeon: Eloy Herring, MD;  Location: [facility];  Service: Gynecology;  Laterality: N/A;    Current Medications[1]  Allergies[2] ROS neg/noncontributory except as noted HPI/below      Objective:     BP (!) 142/70 (BP Location: Left Arm, Patient Position: Sitting)   Pulse 97   Temp (!) 97 F (36.1 C) (Temporal)   Ht 5' (1.524 m)   Wt 186 lb 4 oz (84.5 kg)   SpO2 98%   BMI 36.37 kg/m  Wt Readings from Last 3 Encounters:  12/04/24 186 lb 4 oz (84.5 kg)  10/22/24 188 lb (85.3 kg)  10/22/24 188 lb 8 oz (85.5 kg)    Physical Exam GENERAL: Well developed, well nourished, no acute distress. HEAD EYES EARS NOSE THROAT: Normocephalic, atraumatic, conjunctiva not injected, sclera nonicteric, oral pharynx clear, moist, without exudates. CARDIAC: Regular rate and rhythm, S1 S2 present, . NECK: Supple, no thyromegaly, no nodes,. LUNGS: Clear to auscultation bilaterally, no wheezes. EXTREMITIES: No edema, no swelling in legs. MUSCULOSKELETAL: No gross abnormalities. NEUROLOGICAL: Alert and oriented x3, cranial  nerves II through XII intact. PSYCHIATRIC: Normal mood, good eye contact.   Results for orders placed or performed in visit on 12/04/24  POC COVID-19   Collection Time: 12/04/24  3:02 PM  Result Value Ref Range   SARS Coronavirus 2 Ag Negative Negative  POCT Influenza A/B   Collection Time: 12/04/24  3:03 PM  Result Value Ref Range   Influenza A, POC Negative Negative   Influenza B, POC Negative Negative       Assessment & Plan:  Acute cough -     POCT Influenza A/B -     POC COVID-19 BinaxNow  B12 deficiency -     Cyanocobalamin     Assessment  and Plan Assessment & Plan Acute upper respiratory infection with cough   She presents with acute onset of cough and dyspnea, likely due to a viral infection. Tests for influenza and COVID-19 are negative today, but symptoms began yesterday. Given her exposure to symptomatic individuals, influenza and COVID-19 remain in the differential diagnosis. She is at high risk for complications due to age and comorbidities. Discussed potential use of Paxlovid  for COVID-19 and Tamiflu for influenza if tests are positive, noting risks such as side effects and treatment potentially being worse than the disease. Albuterol inhaler use for dyspnea was discussed, with possible side effects including shakiness, jitteriness, and heart palpitations. She should purchase a combo flu and COVID test from a pharmacy or store and recheck tomorrow. If positive, she should contact the on-call service for a potential Paxlovid  or Tamiflu prescription. Use Dorsey's albuterol inhaler if experiencing significant dyspnea and seek emergency care if dyspnea becomes severe.  General Health Maintenance   Her B12 injection is due and was administered, with no contraindications noted.     Return in about 4 weeks (around 01/01/2025) for chronic follow-up.  Jenkins CHRISTELLA Carrel, MD     [1]  Current Outpatient Medications:    aspirin  EC 81 MG tablet, Take 81 mg by mouth daily. Swallow  whole., Disp: , Rfl:    Cholecalciferol  (VITAMIN D3) 25 MCG (1000 UT) capsule, Take 1,000 Units by mouth daily., Disp: , Rfl:    dapagliflozin  propanediol (FARXIGA ) 5 MG TABS tablet, Take 1 tablet (5 mg total) by mouth daily., Disp: 30 tablet, Rfl: 11   ESTRING  7.5 MCG/24HR vaginal ring, Place 1 each vaginally every 3 (three) months., Disp: 1 each, Rfl: 3   Evolocumab  (REPATHA  SURECLICK) 140 MG/ML SOAJ, INJECT 1 PEN SUBCUTANEOUSLY EVERY 14 DAYS, Disp: 6 mL, Rfl: 3   glucose blood (ACCU-CHEK GUIDE) test strip, Use to test blood sugar 3 times daily, Disp: 100 each, Rfl: 2   levothyroxine  (SYNTHROID ) 50 MCG tablet, Take 1 tablet (50 mcg total) by mouth daily before breakfast., Disp: 90 tablet, Rfl: 3   Multiple Vitamin (MULTIVITAMIN WITH MINERALS) TABS tablet, Take 1 tablet by mouth daily., Disp: 30 tablet, Rfl: 0   potassium chloride  SA (KLOR-CON  M) 20 MEQ tablet, TAKE 1 TABLET BY MOUTH 2 TIMES DAILY, Disp: 180 tablet, Rfl: 0   torsemide  (DEMADEX ) 20 MG tablet, Take 1 tablet (20 mg total) by mouth daily., Disp: 90 tablet, Rfl: 1 [2]  Allergies Allergen Reactions   Amoxicillin -Pot Clavulanate Other (See Comments)    Caused jaundice  Jaundice   Atenolol Other (See Comments)    Stomach problems, chills   Atorvastatin Other (See Comments)    Retained fluid   Canagliflozin Other (See Comments)    ALL DIABETIC MEDS PER PT   abdominal pain, headache   Losartan Potassium Other (See Comments)    chills, HA, stomach pain   Acarbose Nausea Only and Other (See Comments)     headache,nausea   Cinnamon Other (See Comments)    Stomach pain   Empagliflozin Other (See Comments)     headaches   Glimepiride Other (See Comments)    ALL DIABETIC MEDS PER PT  abdominal pain, headache   Metformin And Related Nausea And Vomiting and Other (See Comments)    Patient states that she has severe chills, headache and cramping additionally. Says blood sugar is uncontrolled because of this   Other Other (See  Comments)    ALL DIABETIC MEDS PER PT    Pioglitazone  Other (See Comments)    ALL DIABETIC MEDS PER PT = peripheral edema   Pravastatin Other (See Comments)    Retained fluid   Sitagliptin Other (See Comments)    ALL DIABETIC MEDS PER PT  Other reaction(s): headache   Spironolactone-Hctz Other (See Comments)   Statins Other (See Comments)    Myalgia   Sulfamethoxazole Other (See Comments)    ALL DIABETIC MEDS PER PT    Tramadol  Hcl Itching   Atorvastatin Calcium  Other (See Comments)     retain fld   Crestor [Rosuvastatin] Other (See Comments)   Rosuvastatin Calcium     Torsemide  Nausea Only    Gi upset, chills, abd pain,bloating   Bumex  [Bumetanide ] Rash and Other (See Comments)    Chills, rash, headaches, stomach issues also   Lasix  [Furosemide ] Rash and Other (See Comments)    Sugars increased   Sulfa Antibiotics Rash and Other (See Comments)    Mother has told patient in the past not to take, she cannot recall there reaction   "

## 2024-12-04 NOTE — Telephone Encounter (Signed)
 FYI Only or Action Required?: Action required by provider: refused ED , has appt at 3 PM that was already scheduled prior to call .  Patient was last seen in primary care on 10/22/2024 by Wendolyn Jenkins Jansky, MD.  Called Nurse Triage reporting Breathing Problem.  Symptoms began yesterday.  Interventions attempted: Other: unsure.  Symptoms are: gradually worsening.  Triage Disposition: Go to ED Now (Notify PCP)  Patient/caregiver understands and will follow disposition?: No, refuses disposition    Reason for Disposition  MODERATE difficulty breathing (e.g., speaks in phrases, SOB even at rest, pulse 100-120)  Answer Assessment - Initial Assessment Questions 1. SYMPTOMS: What is your main symptom or concern? (e.g., cough, fever, shortness of breath, muscle aches)     Difficulty breathing audibly speaking in phrases and endorsing feeling short of breath  2. ONSET: When did the symptoms start?      Yesterday  3. COUGH: Do you have a cough? If Yes, ask: How bad is the cough?       Bad cough 4. FEVER: Do you have a fever? If Yes, ask: What is your temperature, how was it measured, and when did it start?     Chills  5. BREATHING DIFFICULTY: Are you having any difficulty breathing? (e.g., normal; shortness of breath, wheezing, unable to speak)      Yes , difficulty breathing at rest currently and lying down at night   6 OTHER SYMPTOMS: Do you have any other symptoms?  (e.g., chills, fatigue, headache, loss of smell or taste, muscle pain, sore throat)     Chills , muscles aches,  7. COVID-19 DIAGNOSIS: How do you know that you have COVID? (e.g., positive lab test or self-test, diagnosed by doctor or NP/PA, symptoms after exposure).     Has not tested  8. COVID-19 EXPOSURE: Was there any known exposure to COVID before the symptoms began?      Possible familly members sick with COVID or flu  9. COVID-19 VACCINE: Have you had the COVID-19 vaccine? If Yes, ask: When did  you last get it?       Unknown  10. HIGH RISK DISEASE: Do you have any chronic medical problems? (e.g., asthma, heart or lung disease, weak immune system, obesity, etc.)       Hypertension, type II DM  11. O2 SATURATION MONITOR:  Do you use an oxygen saturation monitor (pulse oximeter) at home? If Yes, ask What is your reading (oxygen level) today? What is your usual oxygen saturation reading? (e.g., 95%)       Unable to assess  Protocols used: COVID-19 - Diagnosed or Suspected-A-AH Copied from CRM #[id number]. Topic: Clinical - Red Word Triage >> Dec 04, 2024  8:46 AM Joesph NOVAK wrote: Red Word that prompted transfer to Nurse Triage: Pt would like a flu/covid test, trouble breathing, sob, coughing, congestion, headache. Muscle aches and pains. Would like to know if she needs to come in sooner.

## 2024-12-04 NOTE — Patient Instructions (Addendum)
 Combo flu/covid test and check it tomorrow and if positive for either, call.      If need for breathing.  Use Darcy's albuterol inhaler  If bad, emergency room or urgent care

## 2024-12-04 NOTE — Telephone Encounter (Signed)
 This RN called [facility] and reported ED refusal

## 2024-12-14 NOTE — Assessment & Plan Note (Signed)
 proBNP

## 2024-12-14 NOTE — Progress Notes (Signed)
 " Cardiology Office Note:   Date:  12/14/2024  ID:  [name], [name], [name], [name] PCP: Wendolyn Jenkins Jansky, MD  [facility] Providers Cardiologist:  Georganna Archer, MD { Chief Complaint: No chief complaint on file.     History of Present Illness:   [name] is a 73 y.o. female with a PMH of elevated CAC, HFpEF, CAS, HTN, HLD, DM2, CKD, hypothyroidism, pernicious anemia, and cognitive impairment who presents for follow up.  Today the patient presents for follow-up and this is my first time meeting her.  She previously established care with Damien Braver in October for her history of CAD and HFpEF.  Unfortunately, the patient has an extensive allergy list and is intolerant to many therapies that would be utilized to treat her volume status and HFpEF.  She is referred to Pharm.D. who saw her last month and prescribed Farxiga  for HFpEF treatment.  She has not started this medication at the request of her nephrologist as she is currently undergoing some renal testing.  Since this time the patient presented to the ED on 11/30 for a conjunctival hemorrhage.  No surgical intervention was required.  She was recently seen by her PCP for a URI and tested negative for COVID and influenza.   Past Medical History:  Diagnosis Date   Allergy    Anemia    Arthritis    Asthma    Dianosed years ago-no meds at this time   Cancer [facility]) 2021   endometrial    Cataract    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 name of MD    Dementia (HCC)    Diabetes mellitus    type 2    Diverticulosis    Dyslipidemia    Dyspnea    with exertion    Dysrhythmia    skips beats   Endometrial cancer (HCC) 2021   Family history of adverse reaction to anesthesia    son stopped breathing during surgery , mother slow to wake up    Fibromyalgia 1980's    Generalized anxiety disorder    GERD (gastroesophageal reflux disease)    occ- will use Tums and Prolosec  prn    Heart murmur    Hyperlipemia    Hypertension    not on blood pressure meds due to allergies - last dose of meds- months ago    Hypothyroidism    Hypothyroidism (acquired)    Internal hemorrhoids    Irritable bowel syndrome 1980's   Liver cancer (HCC)    Major depressive disorder    Migraine headaches 1980's   On meds-well controlled   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   Morbid obesity (HCC)    Pernicious anemia    Pneumonia 2009   Several times over the past several years   PONV (postoperative nausea and vomiting)    was told by neurologist due to calcificaton in brain not to be put to sleep, N/V during Op and Recovery    Recurrent upper respiratory infection (URI)    Sleep apnea    no cpap   Subarachnoid hemorrhage (HCC) 2010   Vaginal polyp    benign per pt     Studies Reviewed:    EKG: ***       Cardiac Studies & Procedures   ______________________________________________________________________________________________     ECHOCARDIOGRAM  ECHOCARDIOGRAM COMPLETE 10/28/2024  Narrative ECHOCARDIOGRAM REPORT    Patient Name:   [name] Date of Exam: 10/28/2024 Medical Rec #:  [name]         Height:       60.0 in Accession #:    [id number]        Weight:       188.0 lb Date of Birth:  [name]        BSA:          1.818 m Patient Age:    73 years          BP:           141/77 mmHg Patient Gender: F                 HR:           89 bpm. Exam Location:  [address]  Procedure: 2D Echo, Color Doppler and Cardiac Doppler (Both Spectral and Color Flow Doppler were utilized during procedure).  Indications:    Coronary artery disease involving native coronary artery of native heart without angina pectoris [I25.10 (ICD-10-CM)]; Bilateral carotid artery stenosis [I65.23 (ICD-10-CM)]; Primary hypertension [I10 (ICD-10-CM)];  Hyperlipidemia LDL goal <70 [E78.5 (ICD-10-CM)]; Type 2 diabetes mellitus with stage 3a chronic kidney disease, without long-term current use of insulin  (HCC) [E11.22, N18.31 (ICD-10-CM)]  History:        Patient has prior history of Echocardiogram examinations, most recent 03/12/2023. CAD; Risk Factors:Hypertension, Diabetes and Dyslipidemia.  Sonographer:    Rosaline Fujisawa MHA, RDMS, RVT, RDCS Referring Phys: [id number] EMILY C MONGE   Sonographer Comments: Image acquisition challenging due to patient body habitus. IMPRESSIONS   1. Left ventricular ejection fraction, by estimation, is 60 to 65%. The left ventricle has normal function. The left ventricle has no regional wall motion abnormalities. There is mild asymmetric left ventricular hypertrophy of the inferior segment. Left ventricular diastolic parameters are indeterminate. 2. Right ventricular systolic function is normal. The right ventricular size is mildly enlarged. There is mildly elevated pulmonary artery systolic pressure. 3. Left atrial size was mildly dilated. 4. Right atrial size was severely dilated. 5. A small pericardial effusion is present. The pericardial effusion is circumferential. 6. The mitral valve is degenerative. Mild to moderate mitral valve regurgitation. Mild mitral stenosis. Severe mitral annular calcification. 7. Tricuspid valve regurgitation is moderate. 8. The aortic valve is normal in structure. Aortic valve regurgitation is not visualized. Aortic valve sclerosis/calcification is present, without any evidence of aortic stenosis. 9. The inferior vena cava is normal in size with greater than 50% respiratory variability, suggesting right atrial pressure of 3 mmHg.  FINDINGS Left Ventricle: Left ventricular ejection fraction, by estimation, is 60 to 65%. The left ventricle has normal function. The left ventricle has no regional wall motion abnormalities. The left ventricular internal cavity size was normal  in size. There is mild asymmetric left ventricular hypertrophy of the inferior segment. Left ventricular diastolic parameters are indeterminate.  Right Ventricle: The right ventricular size is mildly enlarged. No increase in right ventricular wall thickness. Right ventricular systolic function is normal. There is mildly elevated pulmonary artery systolic pressure. The tricuspid regurgitant velocity is 2.86 m/s, and with an assumed right atrial pressure of 8 mmHg, the estimated right ventricular systolic pressure is 40.7 mmHg.  Left Atrium: Left atrial size was mildly dilated.  Right Atrium: Right atrial size was severely dilated.  Pericardium: A small pericardial effusion is present. The pericardial effusion is circumferential. Presence of epicardial fat  layer.  Mitral Valve: The mitral valve is degenerative in appearance. Severe mitral annular calcification. Mild to moderate mitral valve regurgitation. Mild mitral valve stenosis. MV peak gradient, 11.1 mmHg. The mean mitral valve gradient is 5.0 mmHg.  Tricuspid Valve: The tricuspid valve is normal in structure. Tricuspid valve regurgitation is moderate . No evidence of tricuspid stenosis.  Aortic Valve: The aortic valve is normal in structure. Aortic valve regurgitation is not visualized. Aortic valve sclerosis/calcification is present, without any evidence of aortic stenosis. Aortic valve mean gradient measures 6.0 mmHg. Aortic valve peak gradient measures 10.9 mmHg. Aortic valve area, by VTI measures 1.36 cm.  Pulmonic Valve: The pulmonic valve was not well visualized. Pulmonic valve regurgitation is trivial. No evidence of pulmonic stenosis.  Aorta: The aortic root is normal in size and structure.  Venous: The inferior vena cava is normal in size with greater than 50% respiratory variability, suggesting right atrial pressure of 3 mmHg.  IAS/Shunts: No atrial level shunt detected by color flow Doppler.   LEFT VENTRICLE PLAX  2D LVIDd:         4.50 cm LVIDs:         3.53 cm LV PW:         1.25 cm LV IVS:        0.87 cm LVOT diam:     1.55 cm LV SV:         46 LV SV Index:   25 LVOT Area:     1.89 cm   RIGHT VENTRICLE             IVC RV Basal diam:  4.11 cm     IVC diam: 1.80 cm RV Mid diam:    3.29 cm RV S prime:     16.80 cm/s TAPSE (M-mode): 1.7 cm  LEFT ATRIUM             Index        RIGHT ATRIUM           Index LA diam:        5.20 cm 2.86 cm/m   RA Area:     24.20 cm LA Vol (A2C):   71.6 ml 39.38 ml/m  RA Volume:   80.90 ml  44.50 ml/m LA Vol (A4C):   66.2 ml 36.41 ml/m LA Biplane Vol: 74.6 ml 41.03 ml/m AORTIC VALVE AV Area (Vmax):    1.29 cm AV Area (Vmean):   1.32 cm AV Area (VTI):     1.36 cm AV Vmax:           165.00 cm/s AV Vmean:          111.000 cm/s AV VTI:            0.338 m AV Peak Grad:      10.9 mmHg AV Mean Grad:      6.0 mmHg LVOT Vmax:         113.00 cm/s LVOT Vmean:        77.500 cm/s LVOT VTI:          0.244 m LVOT/AV VTI ratio: 0.72  AORTA Ao Root diam: 2.76 cm Ao Asc diam:  3.56 cm  MITRAL VALVE                TRICUSPID VALVE MV Area (PHT): 3.65 cm     TR Peak grad:   32.7 mmHg MV Area VTI:   1.32 cm     TR Vmax:  286.00 cm/s MV Peak grad:  11.1 mmHg MV Mean grad:  5.0 mmHg     SHUNTS MV Vmax:       1.66 m/s     Systemic VTI:  0.24 m MV Vmean:      105.0 cm/s   Systemic Diam: 1.55 cm MV Decel Time: 208 msec MR Peak grad: 87.6 mmHg MR Vmax:      468.00 cm/s MV E velocity: 145.00 cm/s MV A velocity: 167.00 cm/s MV E/A ratio:  0.87  Kardie Tobb DO Electronically signed by Dub Huntsman DO Signature Date/Time: 10/28/2024/1:42:03 PM    Final    MONITORS  LONG TERM MONITOR (3-14 DAYS) 01/07/2020  Narrative  NSR with occasional PACs and PVCs.  Rare short bursts of tachycardia lasting only seconds.  No sustained arrhythmias.  No atrial fibrillation.  Continue medical therapy.   CT SCANS  CT CORONARY MORPH W/CTA COR W/SCORE  05/02/2020  Addendum 05/03/2020 12:53 PM ADDENDUM REPORT: 05/03/2020 12:50  HISTORY: Chest pain, nonspecific chest pain  EXAM: Cardiac/Coronary CT  TECHNIQUE: The patient was scanned on a [employer].  PROTOCOL: A 120 kV prospective scan was triggered in the descending thoracic aorta at 111 HU's. Axial non-contrast 3 mm slices were carried out through the heart. The data set was analyzed on a dedicated work station and scored using the Agatson method. Gantry rotation speed was 250 msecs and collimation was 0.6 mm. Beta blockade and 0.8 mg of sl NTG was given. The 3D data set was reconstructed retrospectively in 12 sections; section 11/12 represented best diastolic phase for most assessments. Diastolic phases were analyzed on a dedicated work station using MPR, MIP and VRT modes. The patient received 80mL OMNIPAQUE  IOHEXOL  350 MG/ML SOLN of contrast.  FINDINGS: Coronary calcium  score: The patient's coronary artery calcium  score is 879, which places the patient in the 96 percentile.  Coronary arteries: Normal coronary origins.  Right dominance.  Right Coronary Artery: Large caliber vessel, gives rise to PDA and PLB. Proximal portion has scattered calcified plaque with circumferential calcification and 25-49% stenosis. Mid RCA has scattered calcified plaque with maximum 25-49% stenosis. Distal RCA has scattered calcified plaque with maximum 1-24% stenosis.  Left Main Coronary Artery: Normal caliber vessel. No significant plaque or stenosis.  Left Anterior Descending Coronary Artery: Normal caliber vessel, tapers prior to apex. Mild scattered calcified plaque throughout proximal portion. Most significant stenosis is 25-49%. 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 LAD gives rise to 1 small diagonal branch with calcified plaque at ostium.  Left Circumflex Artery: Normal caliber vessel, tapers at mid lateral wall. Mild calcified plaque in proximal portion with  25-49% stenosis. Gives rise to 1 OM branch, no significant stenosis.  Aorta: Normal size, 34 mm at the mid ascending aorta (level of the PA bifurcation) measured double oblique. Mild scattered calcifications. No dissection.  Aortic Valve: No significant calcifications. Trileaflet.  Other findings:  Normal pulmonary vein drainage into the left atrium.  Normal left atrial appendage without a thrombus.  Normal size of the pulmonary artery.  Dense mitral annular calcification.  Calcified bronchi.  PFO present with evidence of left to right flow.  IMPRESSION: 1.  Three vessel mild non-obstructive CAD, CADRADS = 2.  2. Coronary calcium  score of 879. This was 96th percentile for age and sex matched control.  3.  Normal coronary origin with right dominance.  4.  PFO present with evidence of left to right flow.   Electronically Signed By: Shelda Lonni HERO.D.  On: 05/03/2020 12:50  Narrative EXAM: OVER-READ INTERPRETATION  CT CHEST  The following report is an over-read performed by radiologist Dr. Franky Crease of [facility] on 05/02/2020. This over-read does not include interpretation of cardiac or coronary anatomy or pathology. The coronary CTA interpretation by the cardiologist is attached.  COMPARISON:  None.  FINDINGS: Vascular: Heart is normal size. Aorta normal caliber. Scattered aortic calcifications. Small pericardial effusion.  Mediastinum/Nodes: No adenopathy.  Lungs/Pleura: No confluent opacities or effusions.  Upper Abdomen: Imaging into the upper abdomen shows no acute findings.  Musculoskeletal: Chest wall soft tissues are unremarkable. No acute bony abnormality.  IMPRESSION: No acute extra cardiac abnormality.  Aortic atherosclerosis.  Small pericardial effusion.  Electronically Signed: By: Franky Crease M.D. On: 05/02/2020 17:26      ______________________________________________________________________________________________      Risk Assessment/Calculations:   {Does this patient have ATRIAL FIBRILLATION?:[phone]} No BP recorded.  {Refresh Note OR Click here to enter BP  :1}***   STOP-Bang Score:     { Consider Dx Sleep Disordered Breathing or Sleep Apnea  ICD G47.33          :1}     Physical Exam:     VS:  There were no vitals taken for this visit. ***    Wt Readings from Last 3 Encounters:  12/04/24 186 lb 4 oz (84.5 kg)  11/[name]/25 188 lb (85.3 kg)  11/[name]/25 188 lb 8 oz (85.5 kg)     GEN: Well nourished, well developed, in no acute distress NECK: No JVD; No carotid bruits CARDIAC: ***RRR, no murmurs, rubs, gallops RESPIRATORY:  Clear to auscultation without rales, wheezing or rhonchi  ABDOMEN: Soft, non-tender, non-distended, normal bowel sounds EXTREMITIES:  Warm and well perfused, no edema; No deformity, 2+ radial pulses PSYCH: Normal mood and affect   Assessment & Plan Coronary artery disease involving native coronary artery of native heart without angina pectoris  Primary hypertension  Chronic heart failure with preserved ejection fraction (HFpEF) (HCC) proBNP Dyslipidemia       {Are you ordering a CV Procedure (e.g. stress test, cath, DCCV, TEE, etc)?   Press F2        :[id number]}   This note was written with the assistance of a dictation microphone or AI dictation software. Please excuse any typos or grammatical errors.   Signed, Georganna Archer, MD 12/14/2024 9:27 PM    [facility]  "

## 2024-12-15 ENCOUNTER — Ambulatory Visit: Admitting: Student in an Organized Health Care Education/Training Program

## 2024-12-15 ENCOUNTER — Encounter: Payer: Self-pay | Admitting: Student in an Organized Health Care Education/Training Program

## 2024-12-15 VITALS — BP 150/85 | HR 80 | Ht 62.0 in | Wt 180.4 lb

## 2024-12-15 DIAGNOSIS — I10 Essential (primary) hypertension: Secondary | ICD-10-CM | POA: Diagnosis not present

## 2024-12-15 DIAGNOSIS — E785 Hyperlipidemia, unspecified: Secondary | ICD-10-CM | POA: Diagnosis not present

## 2024-12-15 DIAGNOSIS — I25118 Atherosclerotic heart disease of native coronary artery with other forms of angina pectoris: Secondary | ICD-10-CM | POA: Diagnosis not present

## 2024-12-15 DIAGNOSIS — R072 Precordial pain: Secondary | ICD-10-CM

## 2024-12-15 DIAGNOSIS — I5032 Chronic diastolic (congestive) heart failure: Secondary | ICD-10-CM

## 2024-12-15 DIAGNOSIS — I2510 Atherosclerotic heart disease of native coronary artery without angina pectoris: Secondary | ICD-10-CM

## 2024-12-15 MED ORDER — AMLODIPINE BESYLATE 2.5 MG PO TABS: 2.5000 mg | ORAL_TABLET | Freq: Every day | ORAL | 3 refills | Status: AC

## 2024-12-15 NOTE — Assessment & Plan Note (Signed)
-   Patient reports that she has intermittent chest pains and SOB over the past year. -The symptoms sound pretty atypical for coronary artery disease; however, the patient does not exert herself and she has a calcium  score in the 800s which puts her at very high risk for obstructive CAD. -Given her risk profile, I think a stress test is certainly warranted. -Will perform a PET stress for high fidelity.  The patient is agreeable. PET stress test Continue baby aspirin  81 mg daily indefinitely Continue aggressive lipid control Follow-up in 12 months

## 2024-12-15 NOTE — Assessment & Plan Note (Signed)
-   Most recent LDL was at goal.  No changes. Continue Repatha

## 2024-12-15 NOTE — Assessment & Plan Note (Signed)
-   BP is elevated today and the patient reports that at home it also gets even higher. -Unfortunately, the patient has an extensive allergy list and so she is very limited in terms of what treatments for HTN she is able to take. -I see that she was previously prescribed amlodipine , but I cannot see the particular reason why it was discontinued.  This is not listed as an allergy, so we will try this today at a low dose. Start amlodipine  2.5 mg daily

## 2024-12-15 NOTE — Patient Instructions (Signed)
 Medication Instructions:  START Amlodipine  2.5 mg daily   *If you need a refill on your cardiac medications before your next appointment, please call your pharmacy*  Lab Work: PLEASE ASK YOUR ALLERGIST IF TAKING ANTIHISTAMINES SHORTLY AFTER TORSEMIDE  WOULD REDUCE YOUR ALLERGIC SYMPTOMS  If you have labs (blood work) drawn today and your tests are completely normal, you will receive your results only by: MyChart Message (if you have MyChart) OR A paper copy in the mail If you have any lab test that is abnormal or we need to change your treatment, we will call you to review the results.  Testing/Procedures: PET/CT  How to Prepare for Your Cardiac PET/CT Stress Test:  1. Please do not take these medications before your test:  ~Medications that may interfere with the cardiac pharmacological stress agent (ex. nitrates - including erectile dysfunction medications, isosorbide mononitrate, tamulosin or beta-blockers) the day of the exam. (Erectile dysfunction medication should be held for at least 72 hrs prior to test) ~Theophylline containing medications for 12 hours. ~Dipyridamole 48 hours prior to the test. ~Your remaining medications may be taken with water .  2. Nothing to eat or drink, except water , 3 hours prior to arrival time.   ~ NO caffeine/decaffeinated products, or chocolate 12 hours prior to arrival.  3. NO perfume, cologne or lotion on chest or abdomen area.         - FEMALES - Please avoid wearing dresses to this appointment.  4. Total time is 1 to 2 hours; you may want to bring reading material for the waiting time.  Please report to Radiology at the [facility] Main Entrance 30 minutes early for your test. [address] [place], [place] [address]  Diabetic Preparation: - Hold oral medications. - You may take NPH and Lantus insulin . - Do not take Humalog or Humulin R  (Regular Insulin ) the day of your test. - Check blood sugars prior to leaving the  house. - If able to eat breakfast prior to 3 hour fasting, you may take all medications, including your insulin , - Do not worry if you miss your breakfast dose of insulin  - start at your next meal. - Patients who wear a continuous glucose monitor MUST remove the device prior to scanning.  IF YOU THINK YOU MAY BE PREGNANT, OR ARE NURSING PLEASE INFORM THE TECHNOLOGIST.  In preparation for your appointment, medication and supplies will be purchased.  Appointment availability is limited, so if you need to cancel or reschedule, please call the Radiology Department at [phone] [facility] Law) OR [phone] [facility])  24 hours in advance to avoid a cancellation fee of $100.00  What to Expect After you Arrive:  Once you arrive and check in for your appointment, you will be taken to a preparation room within the Radiology Department.  A technologist or Nurse will obtain your medical history, verify that you are correctly prepped for the exam, and explain the procedure.  Afterwards,  an IV will be started in your arm and electrodes will be placed on your skin for EKG monitoring during the stress portion of the exam. Then you will be escorted to the PET/CT scanner.  There, staff will get you positioned on the scanner and obtain a blood pressure and EKG.  During the exam, you will continue to be connected to the EKG and blood pressure machines.  A small, safe amount of a radioactive tracer will be injected in your IV to obtain a series of pictures of your heart along with  an injection of a stress agent.    After your Exam:  It is recommended that you eat a meal and drink a caffeinated beverage to counter act any effects of the stress agent.  Drink plenty of fluids for the remainder of the day and urinate frequently for the first couple of hours after the exam.  Your doctor will inform you of your test results within 7-10 business days.  For more information and frequently asked questions, please visit our  website : [email or web address]  For questions about your test or how to prepare for your test, please call: Cardiac Imaging Nurse Navigators Office: [phone]    Follow-Up: [facility] you and your health needs are our priority.  As part of our continuing mission to provide you with exceptional heart care, our providers are all part of one team.  This team includes your primary Cardiologist (physician) and Advanced Practice Providers or APPs (Physician Assistants and Nurse Practitioners) who all work together to provide you with the care you need, when you need it.  Your next appointment:   12 month(s)  Provider:   Georganna Archer, MD

## 2024-12-18 ENCOUNTER — Other Ambulatory Visit: Payer: Self-pay

## 2024-12-19 ENCOUNTER — Other Ambulatory Visit (HOSPITAL_COMMUNITY): Payer: Self-pay

## 2024-12-24 ENCOUNTER — Ambulatory Visit: Attending: Student in an Organized Health Care Education/Training Program | Admitting: Pharmacist

## 2024-12-24 ENCOUNTER — Encounter: Payer: Self-pay | Admitting: Pharmacist

## 2024-12-24 VITALS — BP 166/82 | HR 85

## 2024-12-24 DIAGNOSIS — I5030 Unspecified diastolic (congestive) heart failure: Secondary | ICD-10-CM

## 2024-12-24 DIAGNOSIS — I10 Essential (primary) hypertension: Secondary | ICD-10-CM

## 2024-12-24 LAB — BASIC METABOLIC PANEL WITH GFR
BUN/Creatinine Ratio: 20 (ref 12–28)
BUN: 32 mg/dL — ABNORMAL HIGH (ref 8–27)
CO2: 20 mmol/L (ref 20–29)
Calcium: 9.3 mg/dL (ref 8.7–10.3)
Chloride: 103 mmol/L (ref 96–106)
Creatinine, Ser: 1.62 mg/dL — ABNORMAL HIGH (ref 0.57–1.00)
Glucose: 147 mg/dL — ABNORMAL HIGH (ref 70–99)
Potassium: 4.9 mmol/L (ref 3.5–5.2)
Sodium: 139 mmol/L (ref 134–144)
eGFR: 33 mL/min/1.73 — ABNORMAL LOW

## 2024-12-24 NOTE — Assessment & Plan Note (Signed)
 Assessment: In office BP 166/82/ mmHg  Patient started taking Farxiga  5 mg daily ( has been on for 3 weeks) tolerates it well  Has not started amlodipine  - concern about side effects  Importance of low salt diet discussed with the patient in detail  Diet includes lots of frozen dinner and salty snacks -willing to cut back   Denies SOB, palpitation, chest pain, headaches,or swelling   Plan:  Continue taking Farxiga  5 mg daily and torsemide  20 mg every 3rd day  Patient to keep record of BP readings with heart rate and report to us  at the next visit Patient to see PharmD in 3 weeks for follow up  Follow up lab(s) : BMP

## 2024-12-24 NOTE — Progress Notes (Signed)
 Patient ID: AYAUNA [name]                 DOB: 1951-09-02                      MRN: [id number]     HPI: [name] is a 74 y.o. female referred by NP, Monge to pharmacy clinic for HFpEF medication management. PMH is significant for T2DM, nonobstructive CAD (CAC score 879, 96th percentile), HTN, HLD, CKD IIIb. Most recent LVEF 60 -65% with indeterminant diastolic paramaters and LV hypertrophy on 10/28/2024. ECHO from 01/30/23 showed evidence of Grade I diastolic dysfunction.   Per NP Monge note, patient at last visit reported increased SOB, bilateral lower extremity edema, weight gain, and mild orthopnea.   Discussion with patient today included the following: cardiac medication indications, introduction to GDMT clinic, reasoning behind medication titration, importance of medication adherence, and patient engagement. Also discussed with patient difference between medications or symptomatic relief and those with long-term cardiovascular benfits. Symptomatically, patient has been experiencing shortness of breath and lower extremity edema. Limited mobility due to use of cane. She checks her weight and notices a decrease with more consistent use of torsemide .  Patient has numerous intolerances to medications, including a chills to loop diuretics, headache with Jardiance, and unknown reactions to spironolactone and sulfa antibiotics. Takes toresemide 40 mg on most days, though reports extreme chills, pain, and trouble breathing with use. Patient does note it works for increasing urine output and causes weight loss. Since patient has documented family history to sulfa antibiotics and history of reactions to sulfa- containing medications, will consider use of ethacrynic acid in place of torsemide . Patient had tried SGLT2i again and it was stopped by nephrologist as some of her test results were still pending. Patient saw Dr.Sullivan on 12/15/2024 amlodipine  2.5 mg daily  was added as her BP was elevated    Patient presented today for follow up. Patient states she has not started amlodipine  but she has started taking Farxiga  5 mg daily from past 3 weeks she tolerates it well. She reluctant to try 2 new medication given her sensitivity to meds in general. We discuss diet and and salt intake in depth. She can't make any major changes to her diet as she has responsibility to take care of her adult daughter. She is willing to cut back on frozen dinner and salty snacks.   Current CHF meds: Torsemide  20 mg every other day or as needed, Farxiga  5 mg daily( max tolerated dose)  Previously tried: Jardiance (headache), spironolactone (unknown)  BP goal: <130/80  Family History: Schizoaffective disorder in daughter    Wt Readings from Last 3 Encounters:  12/15/24 180 lb 6.4 oz (81.8 kg)  12/04/24 186 lb 4 oz (84.5 kg)  10/22/24 188 lb (85.3 kg)   BP Readings from Last 3 Encounters:  12/24/24 (!) 166/82  12/15/24 (!) 150/85  12/04/24 (!) 142/70   Pulse Readings from Last 3 Encounters:  12/24/24 85  12/15/24 80  12/04/24 97    Renal function: CrCl cannot be calculated (Patient's most recent lab result is older than the maximum 21 days allowed.).  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 name of MD    Dementia (HCC)    Diabetes mellitus    type 2    Diverticulosis    Dyslipidemia    Dyspnea    with exertion    Dysrhythmia    skips beats   Endometrial cancer (HCC) 2021   Family history of adverse reaction to anesthesia    son stopped breathing during surgery , mother slow to wake up    Fibromyalgia 1980's   Generalized anxiety disorder    GERD (gastroesophageal reflux  disease)    occ- will use Tums and Prolosec  prn    Heart murmur    Hyperlipemia    Hypertension    not on blood pressure meds due to allergies - last dose of meds- months ago    Hypothyroidism    Hypothyroidism (acquired)    Internal hemorrhoids    Irritable bowel syndrome 1980's   Liver cancer (HCC)    Major depressive disorder    Migraine headaches 1980's   On meds-well controlled   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 180 tablet 3   aspirin  EC 81 MG tablet Take 81 mg by mouth daily. Swallow whole.     Cholecalciferol  (VITAMIN D3) 25 MCG (1000 UT) capsule Take 1,000 Units by mouth daily.     dapagliflozin  propanediol (FARXIGA ) 5 MG TABS tablet Take 1 tablet (5 mg total) by mouth daily. 30 tablet 11   ESTRING  7.5 MCG/24HR vaginal ring Place 1 each vaginally every 3 (three) months. 1 each 3   Evolocumab  (REPATHA  SURECLICK) 140 MG/ML SOAJ INJECT 1 PEN SUBCUTANEOUSLY EVERY 14 DAYS 6 mL 3   glucose blood (ACCU-CHEK GUIDE) test strip Use to test blood sugar 3 times daily 100 each 2   levothyroxine  (SYNTHROID ) 50 MCG tablet Take 1 tablet (50 mcg total) by mouth daily before breakfast. 90 tablet 3   Multiple Vitamin (MULTIVITAMIN WITH MINERALS) TABS tablet Take 1 tablet by mouth daily. 30 tablet 0   potassium chloride  SA (KLOR-CON  M) 20 MEQ tablet TAKE 1 TABLET BY MOUTH 2 TIMES DAILY 180 tablet 0   torsemide  (DEMADEX )  20 MG tablet Take 1 tablet (20 mg total) by mouth daily. 90 tablet 1   No current facility-administered medications on file prior to visit.    Allergies  Allergen Reactions   Amoxicillin -Pot Clavulanate Other (See Comments)    Caused jaundice  Jaundice   Atenolol Other (See Comments)    Stomach problems, chills   Atorvastatin Other (See Comments)    Retained fluid   Canagliflozin Other (See Comments)    ALL DIABETIC MEDS PER PT   abdominal pain, headache   Losartan Potassium Other (See Comments)  chills, HA, stomach pain   Acarbose Nausea Only and Other (See Comments)     headache,nausea   Cinnamon Other (See Comments)    Stomach pain   Empagliflozin Other (See Comments)     headaches   Glimepiride Other (See Comments)    ALL DIABETIC MEDS PER PT  abdominal pain, headache   Metformin And Related Nausea And Vomiting and Other (See Comments)    Patient states that she has severe chills, headache and cramping additionally. Says blood sugar is uncontrolled because of this   Other Other (See Comments)    ALL DIABETIC MEDS PER PT    Pioglitazone Other (See Comments)    ALL DIABETIC MEDS PER PT = peripheral edema   Pravastatin Other (See Comments)    Retained fluid   Sitagliptin Other (See Comments)    ALL DIABETIC MEDS PER PT  Other reaction(s): headache   Spironolactone-Hctz Other (See Comments)   Statins Other (See Comments)    Myalgia   Sulfamethoxazole Other (See Comments)    ALL DIABETIC MEDS PER PT    Tramadol  Hcl Itching   Atorvastatin Calcium  Other (See Comments)     retain fld   Crestor [Rosuvastatin] Other (See Comments)   Rosuvastatin Calcium     Torsemide  Nausea Only    Gi upset, chills, abd pain,bloating   Bumex  [Bumetanide ] Rash and Other (See Comments)    Chills, rash, headaches, stomach issues also   Lasix  [Furosemide ] Rash and Other (See Comments)    Sugars increased   Sulfa Antibiotics Rash and Other (See Comments)    Mother has told patient in  the past not to take, she cannot recall there reaction     Assessment/Plan:  1. CHF -  Problem  (Hfpef) Heart Failure With Preserved Ejection Fraction (Hcc)   Patient has numerous intolerances to medications, including a chills to loop diuretics, headache with Jardiance, and unknown reactions to spironolactone and sulfa antibiotics.  Takes toresemide 40 mg on most days, though reports extreme chills, pain, and trouble breathing with use. Patient does note it works for increasing urine output and causes weight loss.  Since patient has documented family history to sulfa antibiotics and history of reactions to sulfa- containing medications, will consider use of ethacrynic acid in place of torsemide . Willing to trial Farxiga  for cardiovascular benefit.    Hypertension    (HFpEF) heart failure with preserved ejection fraction (HCC) Assessment: In office BP 166/82/ mmHg  Patient started taking Farxiga  5 mg daily ( has been on for 3 weeks) tolerates it well  Has not started amlodipine  - concern about side effects  Importance of low salt diet discussed with the patient in detail  Diet includes lots of frozen dinner and salty snacks -willing to cut back   Denies SOB, palpitation, chest pain, headaches,or swelling   Plan:  Continue taking Farxiga  5 mg daily and torsemide  20 mg every 3rd day  Patient to keep record of BP readings with heart rate and report to us  at the next visit Patient to see PharmD in 3 weeks for follow up  Follow up lab(s) : BMP    Hypertension Assessment and Plan:  In office BP was 166/82. Amlodipine  was prescribed 12/15/2024 pt has not started it - concern about side effects. Patient educated and side effects concerns addressed. Will be starting amlodipine  low dose from tomorrow and will check BP at home. Patient to bring home BP log at next visit in 3 weeks    Thank you  Robbi Blanch, Pharm.D [facility]. [facility] [address], [place], [place] [address] Phone: [phone]; Fax: [phone]

## 2024-12-24 NOTE — Assessment & Plan Note (Signed)
 Assessment and Plan:  In office BP was 166/82. Amlodipine  was prescribed 12/15/2024 pt has not started it - concern about side effects. Patient educated and side effects concerns addressed. Will be starting amlodipine  low dose from tomorrow and will check BP at home. Patient to bring home BP log at next visit in 3 weeks

## 2025-01-04 ENCOUNTER — Ambulatory Visit: Payer: Self-pay

## 2025-01-04 ENCOUNTER — Ambulatory Visit: Admitting: Family Medicine

## 2025-01-04 NOTE — Telephone Encounter (Signed)
 Patient/caller refused triage.  Reason for refusal: patient states she does not need to discuss her diarrhea with nurse triage, she has seen multiple providers for it and continues to have it. She was calling in to reschedule her appointment today with Dr Wendolyn. She had to cancel her appt and her daughter's appt today due to the diarrhea. RN rescheduled appts for patient and advised for any  new or worsening symptoms to call nurse triage.. Reason for Disposition  Requesting regular office appointment  Answer Assessment - Initial Assessment Questions 1. REASON FOR CALL: What is the main reason for your call? or How can I best help you?     Cancelled her appt this AM with Dr Wendolyn, wants to reschedule her office visit/follow up. RN rescheduled for next soonest available with PCP.  2. SYMPTOMS : Do you have any symptoms?      Diarrhea and she refused nurse triage for symptoms.   3. OTHER QUESTIONS: Do you have any other questions?     None.  Protocols used: Information Only Call - No Triage-A-AH

## 2025-01-04 NOTE — Telephone Encounter (Signed)
 1st attempt, called patient's phone twice. No answer, left voicemail for patient to return call for nurse triage.       Message from Deaijah H sent at 01/04/2025  7:46 AM EST  Reason for Triage: Severe diarrhea

## 2025-01-04 NOTE — Telephone Encounter (Signed)
 Noted.

## 2025-01-05 ENCOUNTER — Ambulatory Visit: Admitting: Obstetrics and Gynecology

## 2025-01-06 ENCOUNTER — Ambulatory Visit: Admitting: Obstetrics and Gynecology

## 2025-01-14 ENCOUNTER — Ambulatory Visit: Admitting: Pharmacist

## 2025-01-18 ENCOUNTER — Ambulatory Visit: Admitting: Obstetrics and Gynecology

## 2025-01-19 ENCOUNTER — Ambulatory Visit: Admitting: Family Medicine

## 2025-01-20 ENCOUNTER — Other Ambulatory Visit (HOSPITAL_COMMUNITY): Payer: Self-pay

## 2025-01-21 ENCOUNTER — Telehealth: Payer: Self-pay | Admitting: Student in an Organized Health Care Education/Training Program

## 2025-01-21 MED ORDER — DAPAGLIFLOZIN PROPANEDIOL 5 MG PO TABS: 5.0000 mg | ORAL_TABLET | Freq: Every day | ORAL | 3 refills | Status: AC

## 2025-01-21 NOTE — Telephone Encounter (Signed)
" °*  STAT* If patient is at the pharmacy, call can be transferred to refill team.   1. Which medications need to be refilled? (please list name of each medication and dose if known)   dapagliflozin  propanediol (FARXIGA ) 5 MG TABS tablet    2. Would you like to learn more about the convenience, safety, & potential cost savings by using the [facility]? No    3. Are you open to using the [facility] (Type [facility]. No    4. Which pharmacy/location (including street and city if local pharmacy) is medication to be sent to?  [facility] - [place], [place] - [address]    5. Do they need a 30 day or 90 day supply? 90 day     Pt is completely out.   "

## 2025-01-21 NOTE — Telephone Encounter (Signed)
 Refill sent

## 2025-01-22 ENCOUNTER — Ambulatory Visit

## 2025-01-25 ENCOUNTER — Ambulatory Visit: Admitting: Obstetrics and Gynecology

## 2025-02-02 ENCOUNTER — Ambulatory Visit: Admitting: Family Medicine

## 2025-02-02 ENCOUNTER — Ambulatory Visit: Payer: 59

## 2025-02-03 ENCOUNTER — Encounter (HOSPITAL_COMMUNITY)

## 2025-02-03 ENCOUNTER — Ambulatory Visit

## 2025-02-05 ENCOUNTER — Ambulatory Visit (HOSPITAL_COMMUNITY)

## 2025-02-10 ENCOUNTER — Ambulatory Visit

## 2025-02-23 ENCOUNTER — Other Ambulatory Visit (HOSPITAL_COMMUNITY)

## 2025-02-24 ENCOUNTER — Ambulatory Visit: Admitting: Podiatry

## 2025-05-26 ENCOUNTER — Ambulatory Visit: Admitting: Podiatry
